# Patient Record
Sex: Female | Born: 1950
Health system: Southern US, Community
[De-identification: ages and names within clinical notes are randomized; demographics above are authoritative.]

## PROBLEM LIST (undated history)

## (undated) DIAGNOSIS — I428 Other cardiomyopathies: Secondary | ICD-10-CM

## (undated) DIAGNOSIS — K5790 Diverticulosis of intestine, part unspecified, without perforation or abscess without bleeding: Secondary | ICD-10-CM

## (undated) DIAGNOSIS — E1140 Type 2 diabetes mellitus with diabetic neuropathy, unspecified: Secondary | ICD-10-CM

## (undated) DIAGNOSIS — I447 Left bundle-branch block, unspecified: Secondary | ICD-10-CM

## (undated) DIAGNOSIS — IMO0002 Reserved for concepts with insufficient information to code with codable children: Secondary | ICD-10-CM

## (undated) DIAGNOSIS — E785 Hyperlipidemia, unspecified: Secondary | ICD-10-CM

## (undated) DIAGNOSIS — H269 Unspecified cataract: Secondary | ICD-10-CM

## (undated) DIAGNOSIS — T7840XA Allergy, unspecified, initial encounter: Secondary | ICD-10-CM

## (undated) DIAGNOSIS — N183 Chronic kidney disease, stage 3 unspecified: Secondary | ICD-10-CM

## (undated) DIAGNOSIS — K8020 Calculus of gallbladder without cholecystitis without obstruction: Secondary | ICD-10-CM

## (undated) DIAGNOSIS — C4491 Basal cell carcinoma of skin, unspecified: Secondary | ICD-10-CM

## (undated) DIAGNOSIS — I5020 Unspecified systolic (congestive) heart failure: Secondary | ICD-10-CM

## (undated) DIAGNOSIS — K5090 Crohn's disease, unspecified, without complications: Secondary | ICD-10-CM

## (undated) DIAGNOSIS — Z972 Presence of dental prosthetic device (complete) (partial): Secondary | ICD-10-CM

## (undated) DIAGNOSIS — K589 Irritable bowel syndrome without diarrhea: Secondary | ICD-10-CM

## (undated) DIAGNOSIS — G709 Myoneural disorder, unspecified: Secondary | ICD-10-CM

## (undated) DIAGNOSIS — I10 Essential (primary) hypertension: Secondary | ICD-10-CM

## (undated) DIAGNOSIS — M1990 Unspecified osteoarthritis, unspecified site: Secondary | ICD-10-CM

## (undated) DIAGNOSIS — E1165 Type 2 diabetes mellitus with hyperglycemia: Secondary | ICD-10-CM

## (undated) DIAGNOSIS — I2510 Atherosclerotic heart disease of native coronary artery without angina pectoris: Secondary | ICD-10-CM

## (undated) DIAGNOSIS — K219 Gastro-esophageal reflux disease without esophagitis: Secondary | ICD-10-CM

## (undated) DIAGNOSIS — E1122 Type 2 diabetes mellitus with diabetic chronic kidney disease: Secondary | ICD-10-CM

## (undated) HISTORY — DX: Myoneural disorder, unspecified: G70.9

## (undated) HISTORY — DX: Type 2 diabetes mellitus with diabetic chronic kidney disease: E11.65

## (undated) HISTORY — DX: Left bundle-branch block, unspecified: I44.7

## (undated) HISTORY — DX: Essential (primary) hypertension: I10

## (undated) HISTORY — DX: Allergy, unspecified, initial encounter: T78.40XA

## (undated) HISTORY — DX: Unspecified systolic (congestive) heart failure: I50.20

## (undated) HISTORY — PX: COLONOSCOPY: SHX174

## (undated) HISTORY — DX: Gastro-esophageal reflux disease without esophagitis: K21.9

## (undated) HISTORY — PX: SHOULDER SURGERY: SHX246

## (undated) HISTORY — DX: Calculus of gallbladder without cholecystitis without obstruction: K80.20

## (undated) HISTORY — DX: Unspecified osteoarthritis, unspecified site: M19.90

## (undated) HISTORY — DX: Type 2 diabetes mellitus with diabetic chronic kidney disease: E11.22

## (undated) HISTORY — PX: MOHS SURGERY: SHX181

## (undated) HISTORY — PX: VAGINAL HYSTERECTOMY: SUR661

## (undated) HISTORY — DX: Basal cell carcinoma of skin, unspecified: C44.91

## (undated) HISTORY — DX: Chronic kidney disease, stage 3 unspecified: N18.30

## (undated) HISTORY — DX: Reserved for concepts with insufficient information to code with codable children: IMO0002

## (undated) HISTORY — DX: Unspecified cataract: H26.9

## (undated) HISTORY — PX: SKIN SURGERY: SHX2413

## (undated) HISTORY — PX: CARDIAC CATHETERIZATION: SHX172

## (undated) HISTORY — DX: Other cardiomyopathies: I42.8

## (undated) HISTORY — PX: DILATION AND CURETTAGE OF UTERUS: SHX78

## (undated) HISTORY — DX: Diverticulosis of intestine, part unspecified, without perforation or abscess without bleeding: K57.90

## (undated) HISTORY — DX: Hyperlipidemia, unspecified: E78.5

## (undated) HISTORY — DX: Atherosclerotic heart disease of native coronary artery without angina pectoris: I25.10

## (undated) HISTORY — DX: Irritable bowel syndrome, unspecified: K58.9

## (undated) HISTORY — PX: BREAST CYST ASPIRATION: SHX578

## (undated) HISTORY — DX: Crohn's disease, unspecified, without complications: K50.90

## (undated) HISTORY — DX: Type 2 diabetes mellitus with diabetic neuropathy, unspecified: E11.40

---

## 2001-06-25 ENCOUNTER — Encounter: Payer: Self-pay | Admitting: Internal Medicine

## 2001-10-21 ENCOUNTER — Encounter: Payer: Self-pay | Admitting: Internal Medicine

## 2003-09-22 ENCOUNTER — Other Ambulatory Visit: Payer: Self-pay

## 2004-08-01 ENCOUNTER — Ambulatory Visit: Payer: Self-pay | Admitting: Internal Medicine

## 2004-08-08 ENCOUNTER — Ambulatory Visit: Payer: Self-pay | Admitting: Family Medicine

## 2004-08-09 ENCOUNTER — Ambulatory Visit: Payer: Self-pay | Admitting: Family Medicine

## 2004-09-05 ENCOUNTER — Ambulatory Visit: Payer: Self-pay | Admitting: Internal Medicine

## 2005-02-21 ENCOUNTER — Ambulatory Visit: Payer: Self-pay | Admitting: Internal Medicine

## 2005-10-18 ENCOUNTER — Ambulatory Visit: Payer: Self-pay | Admitting: Internal Medicine

## 2005-10-25 ENCOUNTER — Ambulatory Visit: Payer: Self-pay | Admitting: Internal Medicine

## 2005-11-02 ENCOUNTER — Ambulatory Visit: Payer: Self-pay | Admitting: Internal Medicine

## 2005-12-19 ENCOUNTER — Ambulatory Visit: Payer: Self-pay | Admitting: Internal Medicine

## 2006-04-18 ENCOUNTER — Ambulatory Visit: Payer: Self-pay | Admitting: Internal Medicine

## 2006-04-20 ENCOUNTER — Ambulatory Visit: Payer: Self-pay | Admitting: Internal Medicine

## 2006-08-28 ENCOUNTER — Ambulatory Visit: Payer: Self-pay | Admitting: Family Medicine

## 2006-10-31 ENCOUNTER — Ambulatory Visit: Payer: Self-pay | Admitting: Internal Medicine

## 2006-10-31 LAB — CONVERTED CEMR LAB
Albumin: 3.6 g/dL (ref 3.5–5.2)
BUN: 12 mg/dL (ref 6–23)
Calcium: 10 mg/dL (ref 8.4–10.5)
Chloride: 103 meq/L (ref 96–112)
Creatinine, Ser: 0.9 mg/dL (ref 0.4–1.2)
Creatinine,U: 100.9 mg/dL
GFR calc Af Amer: 84 mL/min
GFR calc non Af Amer: 69 mL/min
Microalb Creat Ratio: 7.9 mg/g (ref 0.0–30.0)
Microalb, Ur: 0.8 mg/dL (ref 0.0–1.9)
Sodium: 138 meq/L (ref 135–145)

## 2006-11-07 ENCOUNTER — Ambulatory Visit: Payer: Self-pay | Admitting: Internal Medicine

## 2006-11-22 ENCOUNTER — Ambulatory Visit: Payer: Self-pay | Admitting: Internal Medicine

## 2006-12-24 ENCOUNTER — Ambulatory Visit: Payer: Self-pay | Admitting: Internal Medicine

## 2007-01-23 ENCOUNTER — Ambulatory Visit: Payer: Self-pay | Admitting: Internal Medicine

## 2007-01-28 ENCOUNTER — Ambulatory Visit: Payer: Self-pay | Admitting: Internal Medicine

## 2007-01-31 ENCOUNTER — Ambulatory Visit: Payer: Self-pay | Admitting: Internal Medicine

## 2007-02-01 ENCOUNTER — Encounter: Payer: Self-pay | Admitting: Internal Medicine

## 2007-02-04 ENCOUNTER — Encounter: Payer: Self-pay | Admitting: Internal Medicine

## 2007-02-05 ENCOUNTER — Encounter: Payer: Self-pay | Admitting: Internal Medicine

## 2007-02-08 ENCOUNTER — Ambulatory Visit: Payer: Self-pay | Admitting: Internal Medicine

## 2007-03-03 ENCOUNTER — Ambulatory Visit: Payer: Self-pay | Admitting: Internal Medicine

## 2007-03-11 ENCOUNTER — Encounter: Payer: Self-pay | Admitting: Internal Medicine

## 2007-03-14 ENCOUNTER — Ambulatory Visit: Payer: Self-pay | Admitting: Internal Medicine

## 2007-04-18 DIAGNOSIS — J309 Allergic rhinitis, unspecified: Secondary | ICD-10-CM | POA: Insufficient documentation

## 2007-04-18 DIAGNOSIS — K5792 Diverticulitis of intestine, part unspecified, without perforation or abscess without bleeding: Secondary | ICD-10-CM | POA: Insufficient documentation

## 2007-04-18 DIAGNOSIS — I10 Essential (primary) hypertension: Secondary | ICD-10-CM | POA: Insufficient documentation

## 2007-04-18 DIAGNOSIS — M1990 Unspecified osteoarthritis, unspecified site: Secondary | ICD-10-CM | POA: Insufficient documentation

## 2007-04-18 DIAGNOSIS — K8020 Calculus of gallbladder without cholecystitis without obstruction: Secondary | ICD-10-CM | POA: Insufficient documentation

## 2007-04-18 DIAGNOSIS — I447 Left bundle-branch block, unspecified: Secondary | ICD-10-CM | POA: Insufficient documentation

## 2007-04-21 DIAGNOSIS — E1122 Type 2 diabetes mellitus with diabetic chronic kidney disease: Secondary | ICD-10-CM | POA: Insufficient documentation

## 2007-04-21 DIAGNOSIS — Z794 Long term (current) use of insulin: Secondary | ICD-10-CM

## 2007-04-21 DIAGNOSIS — E119 Type 2 diabetes mellitus without complications: Secondary | ICD-10-CM

## 2007-04-21 DIAGNOSIS — IMO0002 Reserved for concepts with insufficient information to code with codable children: Secondary | ICD-10-CM | POA: Insufficient documentation

## 2007-05-01 ENCOUNTER — Ambulatory Visit: Payer: Self-pay | Admitting: Internal Medicine

## 2007-05-01 LAB — CONVERTED CEMR LAB
CO2: 28 meq/L (ref 19–32)
Calcium: 10.4 mg/dL (ref 8.4–10.5)
Glucose, Bld: 197 mg/dL — ABNORMAL HIGH (ref 70–99)
Potassium: 3.7 meq/L (ref 3.5–5.1)
Sodium: 139 meq/L (ref 135–145)

## 2007-07-23 ENCOUNTER — Encounter: Payer: Self-pay | Admitting: Internal Medicine

## 2007-08-01 ENCOUNTER — Telehealth (INDEPENDENT_AMBULATORY_CARE_PROVIDER_SITE_OTHER): Payer: Self-pay | Admitting: *Deleted

## 2007-09-12 ENCOUNTER — Ambulatory Visit: Payer: Self-pay | Admitting: Internal Medicine

## 2007-09-30 ENCOUNTER — Telehealth: Payer: Self-pay | Admitting: Family Medicine

## 2008-01-06 ENCOUNTER — Ambulatory Visit: Payer: Self-pay | Admitting: Internal Medicine

## 2008-01-09 LAB — CONVERTED CEMR LAB
CO2: 29 meq/L (ref 19–32)
Cholesterol: 174 mg/dL (ref 0–200)
Creatinine,U: 134.8 mg/dL
GFR calc non Af Amer: 55 mL/min
Glucose, Bld: 143 mg/dL — ABNORMAL HIGH (ref 70–99)
Hemoglobin: 12.4 g/dL (ref 12.0–15.0)
Hgb A1c MFr Bld: 8 % — ABNORMAL HIGH (ref 4.6–6.0)
Lymphocytes Relative: 38.2 % (ref 12.0–46.0)
Microalb Creat Ratio: 5.2 mg/g (ref 0.0–30.0)
Microalb, Ur: 0.7 mg/dL (ref 0.0–1.9)
Monocytes Relative: 7.1 % (ref 3.0–12.0)
Neutro Abs: 4.8 10*3/uL (ref 1.4–7.7)
Platelets: 226 10*3/uL (ref 150–400)
Potassium: 3.4 meq/L — ABNORMAL LOW (ref 3.5–5.1)
RDW: 13.4 % (ref 11.5–14.6)
Sodium: 140 meq/L (ref 135–145)
Total CHOL/HDL Ratio: 6.1
VLDL: 71 mg/dL — ABNORMAL HIGH (ref 0–40)
WBC: 9.1 10*3/uL (ref 4.5–10.5)

## 2008-02-03 ENCOUNTER — Ambulatory Visit: Payer: Self-pay | Admitting: Internal Medicine

## 2008-02-14 ENCOUNTER — Encounter (INDEPENDENT_AMBULATORY_CARE_PROVIDER_SITE_OTHER): Payer: Self-pay | Admitting: *Deleted

## 2008-02-17 ENCOUNTER — Encounter (INDEPENDENT_AMBULATORY_CARE_PROVIDER_SITE_OTHER): Payer: Self-pay | Admitting: *Deleted

## 2008-02-20 ENCOUNTER — Telehealth: Payer: Self-pay | Admitting: Internal Medicine

## 2008-05-01 ENCOUNTER — Telehealth (INDEPENDENT_AMBULATORY_CARE_PROVIDER_SITE_OTHER): Payer: Self-pay | Admitting: *Deleted

## 2008-05-06 ENCOUNTER — Telehealth (INDEPENDENT_AMBULATORY_CARE_PROVIDER_SITE_OTHER): Payer: Self-pay | Admitting: *Deleted

## 2008-07-15 ENCOUNTER — Ambulatory Visit: Payer: Self-pay | Admitting: Internal Medicine

## 2008-07-16 LAB — CONVERTED CEMR LAB
BUN: 18 mg/dL (ref 6–23)
Calcium: 9.7 mg/dL (ref 8.4–10.5)
Eosinophils Absolute: 0.2 10*3/uL (ref 0.0–0.7)
Eosinophils Relative: 2.1 % (ref 0.0–5.0)
HCT: 36.2 % (ref 36.0–46.0)
Hgb A1c MFr Bld: 7.6 % — ABNORMAL HIGH (ref 4.6–6.0)
MCV: 88.6 fL (ref 78.0–100.0)
Monocytes Absolute: 0.8 10*3/uL (ref 0.1–1.0)
Phosphorus: 3 mg/dL (ref 2.3–4.6)
Platelets: 243 10*3/uL (ref 150–400)
Potassium: 3.8 meq/L (ref 3.5–5.1)
RDW: 13.4 % (ref 11.5–14.6)
Sodium: 141 meq/L (ref 135–145)

## 2008-08-24 ENCOUNTER — Telehealth: Payer: Self-pay | Admitting: Internal Medicine

## 2008-09-28 ENCOUNTER — Telehealth: Payer: Self-pay | Admitting: Internal Medicine

## 2008-11-27 ENCOUNTER — Encounter: Payer: Self-pay | Admitting: Internal Medicine

## 2008-12-04 ENCOUNTER — Encounter: Admission: RE | Admit: 2008-12-04 | Discharge: 2008-12-04 | Payer: Self-pay | Admitting: Surgery

## 2008-12-15 ENCOUNTER — Ambulatory Visit: Payer: Self-pay | Admitting: Family Medicine

## 2008-12-16 LAB — CONVERTED CEMR LAB
Albumin: 3.9 g/dL (ref 3.5–5.2)
BUN: 16 mg/dL (ref 6–23)
Bilirubin, Direct: 0 mg/dL (ref 0.0–0.3)
CO2: 28 meq/L (ref 19–32)
Calcium: 9.5 mg/dL (ref 8.4–10.5)
Creatinine, Ser: 0.9 mg/dL (ref 0.4–1.2)
Hgb A1c MFr Bld: 8.5 % — ABNORMAL HIGH (ref 4.6–6.5)
Total Protein: 6.6 g/dL (ref 6.0–8.3)

## 2008-12-23 ENCOUNTER — Ambulatory Visit: Payer: Self-pay | Admitting: Cardiology

## 2008-12-29 ENCOUNTER — Ambulatory Visit: Payer: Self-pay | Admitting: Cardiovascular Disease

## 2008-12-29 ENCOUNTER — Ambulatory Visit: Payer: Self-pay

## 2008-12-29 ENCOUNTER — Encounter: Payer: Self-pay | Admitting: Cardiology

## 2008-12-29 LAB — CONVERTED CEMR LAB
Cholesterol: 177 mg/dL (ref 0–200)
Triglycerides: 254 mg/dL — ABNORMAL HIGH (ref <150)
VLDL: 51 mg/dL — ABNORMAL HIGH (ref 0–40)

## 2008-12-30 ENCOUNTER — Telehealth: Payer: Self-pay | Admitting: Internal Medicine

## 2009-01-04 ENCOUNTER — Encounter: Payer: Self-pay | Admitting: Cardiology

## 2009-01-04 ENCOUNTER — Ambulatory Visit: Payer: Self-pay | Admitting: Internal Medicine

## 2009-01-04 LAB — CONVERTED CEMR LAB
BUN: 20 mg/dL (ref 6–23)
CO2: 22 meq/L (ref 19–32)
Chloride: 105 meq/L (ref 96–112)
Creatinine, Ser: 1.04 mg/dL (ref 0.40–1.20)
Glucose, Bld: 250 mg/dL — ABNORMAL HIGH (ref 70–99)
HCT: 38.3 % (ref 36.0–46.0)
Hemoglobin: 12.2 g/dL (ref 12.0–15.0)
MCHC: 31.9 g/dL (ref 30.0–36.0)
MCV: 91.6 fL (ref 78.0–100.0)
RBC: 4.18 M/uL (ref 3.87–5.11)

## 2009-01-07 ENCOUNTER — Inpatient Hospital Stay (HOSPITAL_BASED_OUTPATIENT_CLINIC_OR_DEPARTMENT_OTHER): Admission: RE | Admit: 2009-01-07 | Discharge: 2009-01-07 | Payer: Self-pay | Admitting: Cardiology

## 2009-01-07 ENCOUNTER — Encounter: Payer: Self-pay | Admitting: Internal Medicine

## 2009-01-07 ENCOUNTER — Ambulatory Visit: Payer: Self-pay | Admitting: Cardiology

## 2009-01-11 ENCOUNTER — Ambulatory Visit: Payer: Self-pay | Admitting: Internal Medicine

## 2009-01-18 ENCOUNTER — Ambulatory Visit: Payer: Self-pay | Admitting: Cardiology

## 2009-01-28 ENCOUNTER — Encounter: Payer: Self-pay | Admitting: Internal Medicine

## 2009-02-05 ENCOUNTER — Ambulatory Visit: Payer: Self-pay | Admitting: Family Medicine

## 2009-02-05 DIAGNOSIS — M109 Gout, unspecified: Secondary | ICD-10-CM | POA: Insufficient documentation

## 2009-02-09 ENCOUNTER — Ambulatory Visit: Payer: Self-pay | Admitting: Endocrinology

## 2009-02-22 ENCOUNTER — Encounter: Payer: Self-pay | Admitting: Cardiology

## 2009-02-22 ENCOUNTER — Ambulatory Visit: Payer: Self-pay | Admitting: Cardiology

## 2009-03-02 ENCOUNTER — Ambulatory Visit: Payer: Self-pay | Admitting: Endocrinology

## 2009-03-08 ENCOUNTER — Telehealth: Payer: Self-pay | Admitting: Cardiology

## 2009-03-10 DIAGNOSIS — K589 Irritable bowel syndrome without diarrhea: Secondary | ICD-10-CM | POA: Insufficient documentation

## 2009-03-11 ENCOUNTER — Encounter: Payer: Self-pay | Admitting: Internal Medicine

## 2009-03-18 ENCOUNTER — Telehealth: Payer: Self-pay | Admitting: Internal Medicine

## 2009-04-13 ENCOUNTER — Ambulatory Visit: Payer: Self-pay | Admitting: Endocrinology

## 2009-04-16 ENCOUNTER — Telehealth: Payer: Self-pay | Admitting: Internal Medicine

## 2009-04-26 ENCOUNTER — Encounter: Payer: Self-pay | Admitting: Cardiology

## 2009-04-26 ENCOUNTER — Ambulatory Visit: Payer: Self-pay | Admitting: Cardiovascular Disease

## 2009-05-03 LAB — CONVERTED CEMR LAB
Bilirubin, Direct: 0.1 mg/dL (ref 0.0–0.3)
Indirect Bilirubin: 0.2 mg/dL (ref 0.0–0.9)
LDL Cholesterol: 75 mg/dL (ref 0–99)
Total CHOL/HDL Ratio: 5.2
Total Protein: 6.8 g/dL (ref 6.0–8.3)
VLDL: 62 mg/dL — ABNORMAL HIGH (ref 0–40)

## 2009-05-11 ENCOUNTER — Ambulatory Visit: Payer: Self-pay | Admitting: Family Medicine

## 2009-05-11 DIAGNOSIS — E785 Hyperlipidemia, unspecified: Secondary | ICD-10-CM | POA: Insufficient documentation

## 2009-05-11 DIAGNOSIS — E1169 Type 2 diabetes mellitus with other specified complication: Secondary | ICD-10-CM | POA: Insufficient documentation

## 2009-05-13 LAB — CONVERTED CEMR LAB
Creatinine,U: 153.9 mg/dL
Hgb A1c MFr Bld: 8.7 % — ABNORMAL HIGH (ref 4.6–6.5)
Microalb, Ur: 1.5 mg/dL (ref 0.0–1.9)

## 2009-06-04 ENCOUNTER — Ambulatory Visit: Payer: Self-pay | Admitting: Endocrinology

## 2009-06-14 ENCOUNTER — Telehealth: Payer: Self-pay | Admitting: Family Medicine

## 2009-06-22 ENCOUNTER — Ambulatory Visit: Payer: Self-pay | Admitting: Endocrinology

## 2009-07-06 ENCOUNTER — Encounter: Payer: Self-pay | Admitting: Cardiology

## 2009-07-06 ENCOUNTER — Ambulatory Visit: Payer: Self-pay

## 2009-07-07 ENCOUNTER — Telehealth: Payer: Self-pay | Admitting: Cardiology

## 2009-07-12 ENCOUNTER — Ambulatory Visit: Payer: Self-pay | Admitting: Cardiology

## 2009-07-20 ENCOUNTER — Ambulatory Visit: Payer: Self-pay | Admitting: Endocrinology

## 2009-07-26 ENCOUNTER — Ambulatory Visit: Payer: Self-pay | Admitting: Internal Medicine

## 2009-07-26 ENCOUNTER — Encounter: Payer: Self-pay | Admitting: Cardiology

## 2009-07-27 LAB — CONVERTED CEMR LAB
BUN: 13 mg/dL (ref 6–23)
Calcium: 9.9 mg/dL (ref 8.4–10.5)
Potassium: 4 meq/L (ref 3.5–5.3)
Sodium: 140 meq/L (ref 135–145)

## 2009-08-02 ENCOUNTER — Ambulatory Visit: Payer: Self-pay | Admitting: Endocrinology

## 2009-08-02 DIAGNOSIS — L408 Other psoriasis: Secondary | ICD-10-CM | POA: Insufficient documentation

## 2009-08-12 ENCOUNTER — Encounter: Payer: Self-pay | Admitting: Cardiology

## 2009-11-15 ENCOUNTER — Ambulatory Visit: Payer: Self-pay | Admitting: Family Medicine

## 2009-11-18 LAB — CONVERTED CEMR LAB
Basophils Absolute: 0 10*3/uL (ref 0.0–0.1)
Direct LDL: 113.7 mg/dL
Eosinophils Relative: 2.2 % (ref 0.0–5.0)
HCT: 39.9 % (ref 36.0–46.0)
Hgb A1c MFr Bld: 9.1 % — ABNORMAL HIGH (ref 4.6–6.5)
Lymphocytes Relative: 41.5 % (ref 12.0–46.0)
Lymphs Abs: 3.2 10*3/uL (ref 0.7–4.0)
Monocytes Relative: 11 % (ref 3.0–12.0)
Platelets: 205 10*3/uL (ref 150.0–400.0)
RDW: 13.5 % (ref 11.5–14.6)
WBC: 7.7 10*3/uL (ref 4.5–10.5)

## 2009-11-19 ENCOUNTER — Encounter: Payer: Self-pay | Admitting: Internal Medicine

## 2009-11-29 ENCOUNTER — Encounter: Payer: Self-pay | Admitting: Family Medicine

## 2009-11-30 ENCOUNTER — Ambulatory Visit: Payer: Self-pay | Admitting: Endocrinology

## 2009-12-14 ENCOUNTER — Ambulatory Visit: Payer: Self-pay | Admitting: Endocrinology

## 2010-01-11 ENCOUNTER — Ambulatory Visit: Payer: Self-pay | Admitting: Endocrinology

## 2010-02-08 ENCOUNTER — Ambulatory Visit: Payer: Self-pay | Admitting: Endocrinology

## 2010-02-22 ENCOUNTER — Encounter: Payer: Self-pay | Admitting: Endocrinology

## 2010-03-17 ENCOUNTER — Encounter (INDEPENDENT_AMBULATORY_CARE_PROVIDER_SITE_OTHER): Payer: Self-pay | Admitting: *Deleted

## 2010-03-22 ENCOUNTER — Ambulatory Visit: Payer: Self-pay | Admitting: Endocrinology

## 2010-03-22 LAB — CONVERTED CEMR LAB
Glucose, Urine, Semiquant: NEGATIVE
Hgb A1c MFr Bld: 8.5 % — ABNORMAL HIGH (ref 4.6–6.5)
Ketones, urine, test strip: NEGATIVE
Urobilinogen, UA: 0.2
WBC Urine, dipstick: NEGATIVE
pH: 5

## 2010-04-26 ENCOUNTER — Ambulatory Visit: Payer: Self-pay | Admitting: Family Medicine

## 2010-05-01 DIAGNOSIS — R740 Nonspecific elevation of levels of transaminase and lactic acid dehydrogenase [LDH]: Secondary | ICD-10-CM

## 2010-05-01 DIAGNOSIS — R7401 Elevation of levels of liver transaminase levels: Secondary | ICD-10-CM | POA: Insufficient documentation

## 2010-05-01 LAB — CONVERTED CEMR LAB
AST: 76 units/L — ABNORMAL HIGH (ref 0–37)
Alkaline Phosphatase: 86 units/L (ref 39–117)
Basophils Absolute: 0 10*3/uL (ref 0.0–0.1)
Basophils Relative: 0.5 % (ref 0.0–3.0)
Bilirubin, Direct: 0.1 mg/dL (ref 0.0–0.3)
CO2: 28 meq/L (ref 19–32)
Calcium: 10 mg/dL (ref 8.4–10.5)
Creatinine, Ser: 0.9 mg/dL (ref 0.4–1.2)
Direct LDL: 123.3 mg/dL
Eosinophils Absolute: 0.2 10*3/uL (ref 0.0–0.7)
GFR calc non Af Amer: 68.14 mL/min (ref >60)
Hemoglobin: 13.4 g/dL (ref 12.0–15.0)
Lymphocytes Relative: 38.4 % (ref 12.0–46.0)
MCHC: 34.2 g/dL (ref 30.0–36.0)
Monocytes Relative: 8.2 % (ref 3.0–12.0)
Neutro Abs: 4.8 10*3/uL (ref 1.4–7.7)
Neutrophils Relative %: 50.6 % (ref 43.0–77.0)
RBC: 4.34 M/uL (ref 3.87–5.11)
Sodium: 137 meq/L (ref 135–145)
Total Protein: 7 g/dL (ref 6.0–8.3)
WBC: 9.5 10*3/uL (ref 4.5–10.5)

## 2010-05-26 ENCOUNTER — Encounter (INDEPENDENT_AMBULATORY_CARE_PROVIDER_SITE_OTHER): Payer: Self-pay | Admitting: *Deleted

## 2010-06-02 ENCOUNTER — Ambulatory Visit: Payer: Self-pay | Admitting: Family Medicine

## 2010-06-13 ENCOUNTER — Ambulatory Visit: Payer: Self-pay | Admitting: Family Medicine

## 2010-06-14 ENCOUNTER — Encounter: Admission: RE | Admit: 2010-06-14 | Discharge: 2010-06-14 | Payer: Self-pay | Admitting: Family Medicine

## 2010-06-15 LAB — CONVERTED CEMR LAB: Hepatitis B Surface Ag: NEGATIVE

## 2010-08-22 ENCOUNTER — Ambulatory Visit: Payer: Self-pay | Admitting: Cardiovascular Disease

## 2010-10-24 ENCOUNTER — Other Ambulatory Visit: Payer: Self-pay | Admitting: Family Medicine

## 2010-10-24 ENCOUNTER — Telehealth: Payer: Self-pay | Admitting: Family Medicine

## 2010-10-24 ENCOUNTER — Telehealth: Payer: Self-pay | Admitting: Endocrinology

## 2010-10-24 ENCOUNTER — Ambulatory Visit
Admission: RE | Admit: 2010-10-24 | Discharge: 2010-10-24 | Payer: Self-pay | Source: Home / Self Care | Attending: Family Medicine | Admitting: Family Medicine

## 2010-10-25 ENCOUNTER — Encounter: Payer: Self-pay | Admitting: Family Medicine

## 2010-10-25 LAB — HEPATIC FUNCTION PANEL
ALT: 76 U/L — ABNORMAL HIGH (ref 0–35)
AST: 77 U/L — ABNORMAL HIGH (ref 0–37)
Albumin: 4.1 g/dL (ref 3.5–5.2)
Alkaline Phosphatase: 115 U/L (ref 39–117)
Bilirubin, Direct: 0.1 mg/dL (ref 0.0–0.3)
Total Bilirubin: 0.2 mg/dL — ABNORMAL LOW (ref 0.3–1.2)
Total Protein: 6.5 g/dL (ref 6.0–8.3)

## 2010-10-25 LAB — BASIC METABOLIC PANEL WITH GFR
BUN: 16 mg/dL (ref 6–23)
CO2: 29 meq/L (ref 19–32)
Calcium: 9.9 mg/dL (ref 8.4–10.5)
Chloride: 100 meq/L (ref 96–112)
Creatinine, Ser: 1.2 mg/dL (ref 0.4–1.2)
GFR: 50.25 mL/min — ABNORMAL LOW
Glucose, Bld: 405 mg/dL — ABNORMAL HIGH (ref 70–99)
Potassium: 4.5 meq/L (ref 3.5–5.1)
Sodium: 136 meq/L (ref 135–145)

## 2010-10-26 LAB — CONVERTED CEMR LAB
AST: 74 units/L — ABNORMAL HIGH (ref 0–37)
Albumin: 3.9 g/dL (ref 3.5–5.2)
Alkaline Phosphatase: 99 units/L (ref 39–117)
Total Protein: 6.3 g/dL (ref 6.0–8.3)

## 2010-11-01 NOTE — Assessment & Plan Note (Signed)
Summary: FU--STC    Vital Signs:  Patient profile:   60 year old female Height:      63 inches (160.02 cm) Weight:      149.38 pounds (67.90 kg) O2 Sat:      97 % on Room air Temp:     97.9 degrees F (36.61 degrees C) oral Pulse rate:   92 / minute BP sitting:   132 / 70  (left arm) Cuff size:   regular  Vitals Entered By: Gardenia Phlegm RMA (November 30, 2009 1:07 PM)  O2 Flow:  Room air CC: Follow-up visit/ CF Is Patient Diabetic? Yes   Primary Provider:  Owens Loffler, MD  CC:  Follow-up visit/ CF.  History of Present Illness: pt states she feels well in general.  she brings a record of her cbg's which i have reviewed today. it is 100's-200's before meals.  she does not check at hs.  it is in general lowest in am, then higher as the day goes on.    Current Medications (verified): 1)  Tramadol Hcl 50 Mg Tabs (Tramadol Hcl) .... Take 1 Tablet By  A Day As Needed 2)  Trazodone Hcl 150 Mg  Tabs (Trazodone Hcl) .... Take 1/2 By Mouth At Bedtime 3)  Lantus Solostar 100 Unit/ml  Soln (Insulin Glargine) .... 40 Units Once A Day 4)  Lisinopril 40 Mg Tabs (Lisinopril) .... Take One Tablet By Mouth Daily 5)  Align  Caps (Misc Intestinal Flora Regulat) .Marland Kitchen.. 1 Daily 6)  Aspirin 81 Mg Tbec (Aspirin) .... Take One Tablet By Mouth Daily 7)  Carvedilol 25 Mg Tabs (Carvedilol) .Marland Kitchen.. 1 By Mouth Two Times A Day 8)  Humalog Kwikpen 100 Unit/ml Soln (Insulin Lispro (Human)) .... Three Times A Day (Qac) 30-30-30 Units 9)  Novofine 32g X 6 Mm Misc (Insulin Pen Needle) .... Use Per Directions With Solastar 10)  Colchicine 0.6 Mg Tabs (Colchicine) .Marland Kitchen.. 1 Daily To Two Times A Day As Needed For Gout Flare 11)  Pravachol 10 Mg Tabs (Pravastatin Sodium) .... 1/2 Tablet  By Mouth At Bedtime 12)  Coq10 50 Mg Caps (Coenzyme Q10) .... Once Daily  Allergies (verified): 1)  * Inderal (Propranolol) 2)  Tenex (Guanfacine Hcl) 3)  * Lodine (Etodolac) 4)  Avandia (Rosiglitazone Maleate) 5)  Amaryl  (Glimepiride)  Past History:  Past Medical History: Last updated: 07/12/2009 1. Diabetes, on insulin.  2. Diverticulitis. 3. Hypertension. 4. Osteoarthritis. 5. Symptomatic cholelithiasis. 6. Chronic left bundle branch block, this was first noted about 10 years ago. 7. Irritable bowel syndrome. 8. Mild nonischemic cardiomyopathy.  The patient had an echo done in March 2010 showing EF of 40%, inferior hypokinesis, mild LVH, septal dyssynergy, and no significant valvular problems.  Left heart catheterization was done to assess for coronary disease and the cause of cardiomyopathy, LVEDP was 16 mmHg, EF was 45%.  There were mild luminal irregularities only in the coronaries.  It is possible that mild cardiomyopathy is due to poorly controlled diabetes. Most recent echo (10/10) with EF 50-55%, no regional wall motion abnormalities, LV dyssynergy consistent with LBBB.  9. Allergic rhinitis 10. IRRITABLE BOWEL SYNDROME (ICD-564.1) 11. GOUT (ICD-274.9) 12. Hyperlipidemia   [facility] Dr Jeannett Senior Dr [phone] Dr Maryellen Pile  [phone]  Review of Systems  The patient denies syncope.         she had 1 episode of mild hypoglycemia, just before lunch.  Physical Exam  General:  normal appearance.   Skin:  insulin injection sites at  anterior abdomen are normal    Impression & Recommendations:  Problem # 1:  DIABETES MELLITUS, TYPE II (ICD-250.00) needs increased rx  Medications Added to Medication List This Visit: 1)  Humalog Kwikpen 100 Unit/ml Soln (Insulin lispro (human)) .... Three times a day (qac) 30-40-40 units 2)  Coq10 50 Mg Caps (Coenzyme q10) .... Once daily  Other Orders: Est. Patient Level III (33125)  Patient Instructions: 1)  continue lantus 40 units per day. 2)  increase humalog to (just before each meal) 30-40-40 units. 3)  Please schedule a follow-up appointment in 2 weeks. 4)  check your blood sugar 4 times a day--before the 3 meals, and at  bedtime.  also check if you have symptoms of your blood sugar being too high or too low.  please keep a record of the readings and bring it to your next appointment here.  please call us sooner if you are having low blood sugar episodes. 5)  it is very important to check at betime. Prescriptions: HUMALOG KWIKPEN 100 UNIT/ML SOLN (INSULIN LISPRO (HUMAN)) three times a day (qac) 30-40-40 units  #1 box x 11   Entered and Authorized by:   Donavan Foil MD   Signed by:   Donavan Foil MD on 11/30/2009   Method used:   Print then Give to Patient   RxID:   [id number]

## 2010-11-01 NOTE — Letter (Signed)
Summary: [facility]  [facility]   Imported By: Bubba Hales 03/03/2010 11:14:48  _____________________________________________________________________  External Attachment:    Type:   Image     Comment:   External Document

## 2010-11-01 NOTE — Assessment & Plan Note (Signed)
Summary: 1 MOS F/U / # /CD   Vital Signs:  Patient profile:   60 year old female Height:      64 inches (162.56 cm) Weight:      150.13 pounds (68.24 kg) O2 Sat:      96 % on Room air Temp:     98.2 degrees F (36.78 degrees C) oral Pulse rate:   95 / minute BP sitting:   110 / 60  (left arm) Cuff size:   regular  Vitals Entered By: Gardenia Phlegm RMA (Feb 08, 2010 4:08 PM)  O2 Flow:  Room air CC: 1 month follow up/ CF Is Patient Diabetic? Yes   Primary Provider:  Owens Loffler, MD  CC:  1 month follow up/ CF.  History of Present Illness: she brings a record of her cbg's which i have reviewed today. she says cbg goes low before lunch if she takes 45 units humalog with breakfast.  it varies from 100-250.  it is highest at hs, and lowest in am.  pt states he feels well in general.   Current Medications (verified): 1)  Tramadol Hcl 50 Mg Tabs (Tramadol Hcl) .... Take 1 Tablet By  A Day As Needed 2)  Trazodone Hcl 150 Mg  Tabs (Trazodone Hcl) .... Take 1/2 By Mouth At Bedtime 3)  Lantus Solostar 100 Unit/ml  Soln (Insulin Glargine) .... 55 Units Once A Day 4)  Lisinopril 40 Mg Tabs (Lisinopril) .... Take One Tablet By Mouth Daily 5)  Align  Caps (Misc Intestinal Flora Regulat) .Marland Kitchen.. 1 Daily 6)  Aspirin 81 Mg Tbec (Aspirin) .... Take One Tablet By Mouth Daily 7)  Carvedilol 25 Mg Tabs (Carvedilol) .Marland Kitchen.. 1 By Mouth Two Times A Day 8)  Humalog Kwikpen 100 Unit/ml Soln (Insulin Lispro (Human)) .... Three Times A Day (Qac) 45-50-50 Units 9)  Novofine 32g X 6 Mm Misc (Insulin Pen Needle) .... Use Per Directions With Solastar 10)  Colchicine 0.6 Mg Tabs (Colchicine) .Marland Kitchen.. 1 Daily To Two Times A Day As Needed For Gout Flare 11)  Pravachol 10 Mg Tabs (Pravastatin Sodium) .... 1/2 Tablet  By Mouth At Bedtime 12)  Coq10 50 Mg Caps (Coenzyme Q10) .... Once Daily  Allergies (verified): 1)  * Inderal (Propranolol) 2)  Tenex (Guanfacine Hcl) 3)  * Lodine (Etodolac) 4)  Avandia  (Rosiglitazone Maleate) 5)  Amaryl (Glimepiride)  Past History:  Past Medical History: Last updated: 07/12/2009 1. Diabetes, on insulin.  2. Diverticulitis. 3. Hypertension. 4. Osteoarthritis. 5. Symptomatic cholelithiasis. 6. Chronic left bundle branch block, this was first noted about 10 years ago. 7. Irritable bowel syndrome. 8. Mild nonischemic cardiomyopathy.  The patient had an echo done in March 2010 showing EF of 40%, inferior hypokinesis, mild LVH, septal dyssynergy, and no significant valvular problems.  Left heart catheterization was done to assess for coronary disease and the cause of cardiomyopathy, LVEDP was 16 mmHg, EF was 45%.  There were mild luminal irregularities only in the coronaries.  It is possible that mild cardiomyopathy is due to poorly controlled diabetes. Most recent echo (10/10) with EF 50-55%, no regional wall motion abnormalities, LV dyssynergy consistent with LBBB.  9. Allergic rhinitis 10. IRRITABLE BOWEL SYNDROME (ICD-564.1) 11. GOUT (ICD-274.9) 12. Hyperlipidemia   [facility] Dr Jeannett Senior Dr [phone] Dr Maryellen Pile  [phone]  Social History: Reviewed history from 03/10/2009 and no changes required. Former Smoker, quit 6/01 Alcohol use-no Marital Status: Married Children: None Occupation: Electrical engineer Regular Exercise - no Drug Use -  no  Review of Systems  The patient denies syncope.    Physical Exam  General:  normal appearance.   Psych:  Alert and cooperative; normal mood and affect; normal attention span and concentration.     Impression & Recommendations:  Problem # 1:  DIABETES MELLITUS, TYPE II (ICD-250.00) Assessment Improved she needs some minor adjustments in her therapy  Medications Added to Medication List This Visit: 1)  Lantus Solostar 100 Unit/ml Soln (Insulin glargine) .... 45 units once a day 2)  Humalog Kwikpen 100 Unit/ml Soln (Insulin lispro (human)) .... Three times a day  (qac) 40-50-60 units  Other Orders: Est. Patient Level III (04599)  Patient Instructions: 1)  decrease lantus to 45 units at bedtime. 2)  decrease humalog to (just before each meal) 40-50-60 units. 3)  Please schedule a follow-up appointment in 6 weeks. 4)  check your blood sugar 4 times a day--before the 3 meals, and at bedtime.  also check if you have symptoms of your blood sugar being too high or too low.  please keep a record of the readings and bring it to your next appointment here.  please call us sooner if you are having low blood sugar episodes.
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
## 2010-11-01 NOTE — Assessment & Plan Note (Signed)
Summary: 2 WK ROV /NWS   Vital Signs:  Patient profile:   60 year old female Height:      64 inches Weight:      146.25 pounds BMI:     25.19 O2 Sat:      96 % on Room air Temp:     98.9 degrees F oral Pulse rate:   98 / minute BP sitting:   126 / 78  (left arm) Cuff size:   regular  Vitals Entered By: Crissie Sickles, CMA (December 14, 2009 4:31 PM)  O2 Flow:  Room air CC: 2 week F/U   Primary Provider:  Owens Loffler, MD  CC:  2 week F/U.  History of Present Illness: pt states he feels well in general.  he brings a record of his cbg's which i have reviewed today.  it is improved overall.  most are in the 200's, but some are in the mid-100's.  it is lowest in the afternoon, and higher at other times of day.  Allergies: 1)  * Inderal (Propranolol) 2)  Tenex (Guanfacine Hcl) 3)  * Lodine (Etodolac) 4)  Avandia (Rosiglitazone Maleate) 5)  Amaryl (Glimepiride)  Past History:  Past Medical History: Last updated: 07/12/2009 1. Diabetes, on insulin.  2. Diverticulitis. 3. Hypertension. 4. Osteoarthritis. 5. Symptomatic cholelithiasis. 6. Chronic left bundle branch block, this was first noted about 10 years ago. 7. Irritable bowel syndrome. 8. Mild nonischemic cardiomyopathy.  The patient had an echo done in March 2010 showing EF of 40%, inferior hypokinesis, mild LVH, septal dyssynergy, and no significant valvular problems.  Left heart catheterization was done to assess for coronary disease and the cause of cardiomyopathy, LVEDP was 16 mmHg, EF was 45%.  There were mild luminal irregularities only in the coronaries.  It is possible that mild cardiomyopathy is due to poorly controlled diabetes. Most recent echo (10/10) with EF 50-55%, no regional wall motion abnormalities, LV dyssynergy consistent with LBBB.  9. Allergic rhinitis 10. IRRITABLE BOWEL SYNDROME (ICD-564.1) 11. GOUT (ICD-274.9) 12. Hyperlipidemia   [facility] Dr Jeannett Senior Dr [phone] Dr  Maryellen Pile  [phone]  Review of Systems  The patient denies hypoglycemia.    Physical Exam  General:  normal appearance.   Psych:  Alert and cooperative; normal mood and affect; normal attention span and concentration.     Impression & Recommendations:  Problem # 1:  DIABETES MELLITUS, TYPE II (ICD-250.00) needs increased rx  Medications Added to Medication List This Visit: 1)  Lantus Solostar 100 Unit/ml Soln (Insulin glargine) .... 45 units once a day 2)  Humalog Kwikpen 100 Unit/ml Soln (Insulin lispro (human)) .... Three times a day (qac) 35-40-45 units  Other Orders: Est. Patient Level III (94801)  Patient Instructions: 1)  increase lantus to 45 units per day. 2)  increase humalog to (just before each meal) 35-40-45 units. 3)  Please schedule a follow-up appointment in 1 month. 4)  check your blood sugar 4 times a day--before the 3 meals, and at bedtime.  also check if you have symptoms of your blood sugar being too high or too low.  please keep a record of the readings and bring it to your next appointment here.  please call us sooner if you are having low blood sugar episodes. 5)  you should consider insulin pump therapy.  please let me know if you want blood tests to see if you qualify.

## 2010-11-01 NOTE — Miscellaneous (Signed)
  Medications Added ACCU-CHEK ADVANTAGE TEST  STRP (GLUCOSE BLOOD) test blood sugar 2 times daily       Clinical Lists Changes  Medications: Added new medication of ACCU-CHEK ADVANTAGE TEST  STRP (GLUCOSE BLOOD) test blood sugar 2 times daily - Signed Rx of ACCU-CHEK ADVANTAGE TEST  STRP (GLUCOSE BLOOD) test blood sugar 2 times daily;  #60 x 5;  Signed;  Entered by: Zenda Alpers CMA (AAMA);  Authorized by: Owens Loffler MD;  Method used: Electronically to [facility] [address] [place], [place], [place]  [address], Ph: [phone], Fax: [phone] Observations: Added new observation of MEDS REVIEW: Done (03/17/2010 14:12) Added new observation of ALLERGY REV: Done (03/17/2010 14:12)    Prescriptions: ACCU-CHEK ADVANTAGE TEST  STRP (GLUCOSE BLOOD) test blood sugar 2 times daily  #60 x 5   Entered by:   Zenda Alpers CMA ([place])   Authorized by:   Owens Loffler MD   Signed by:   Zenda Alpers CMA ([place]) on 03/17/2010   Method used:   Electronically to        [facility] (retail)       [place], [place]  [address]       Ph: [phone]       Fax: [phone]   RxID:   [id number]   Current Allergies (reviewed today): * INDERAL (PROPRANOLOL) TENEX (GUANFACINE HCL) * LODINE (ETODOLAC) AVANDIA (ROSIGLITAZONE MALEATE) AMARYL (GLIMEPIRIDE)

## 2010-11-01 NOTE — Assessment & Plan Note (Signed)
Summary: 1 MO ROV /NWS  #   Vital Signs:  Patient profile:   60 year old female Height:      64 inches (162.56 cm) Weight:      148.25 pounds (67.39 kg) O2 Sat:      96 % on Room air Temp:     97.8 degrees F (36.56 degrees C) oral Pulse rate:   98 / minute BP sitting:   120 / 70  (left arm) Cuff size:   regular  Vitals Entered By: Gardenia Phlegm RMA (January 11, 2010 3:54 PM)  O2 Flow:  Room air CC: 1 month follow up/ CF Is Patient Diabetic? Yes   Primary Provider:  Owens Loffler, MD  CC:  1 month follow up/ CF.  History of Present Illness: she brings a record of her cbg's which i have reviewed today.  she does not check at hs, but it is 180-240 at all other times of day, with no trend.  pt states she feels well in general.  Current Medications (verified): 1)  Tramadol Hcl 50 Mg Tabs (Tramadol Hcl) .... Take 1 Tablet By  A Day As Needed 2)  Trazodone Hcl 150 Mg  Tabs (Trazodone Hcl) .... Take 1/2 By Mouth At Bedtime 3)  Lantus Solostar 100 Unit/ml  Soln (Insulin Glargine) .... 45 Units Once A Day 4)  Lisinopril 40 Mg Tabs (Lisinopril) .... Take One Tablet By Mouth Daily 5)  Align  Caps (Misc Intestinal Flora Regulat) .Marland Kitchen.. 1 Daily 6)  Aspirin 81 Mg Tbec (Aspirin) .... Take One Tablet By Mouth Daily 7)  Carvedilol 25 Mg Tabs (Carvedilol) .Marland Kitchen.. 1 By Mouth Two Times A Day 8)  Humalog Kwikpen 100 Unit/ml Soln (Insulin Lispro (Human)) .... Three Times A Day (Qac) 35-40-45 Units 9)  Novofine 32g X 6 Mm Misc (Insulin Pen Needle) .... Use Per Directions With Solastar 10)  Colchicine 0.6 Mg Tabs (Colchicine) .Marland Kitchen.. 1 Daily To Two Times A Day As Needed For Gout Flare 11)  Pravachol 10 Mg Tabs (Pravastatin Sodium) .... 1/2 Tablet  By Mouth At Bedtime 12)  Coq10 50 Mg Caps (Coenzyme Q10) .... Once Daily  Allergies (verified): 1)  * Inderal (Propranolol) 2)  Tenex (Guanfacine Hcl) 3)  * Lodine (Etodolac) 4)  Avandia (Rosiglitazone Maleate) 5)  Amaryl (Glimepiride)  Past  History:  Past Medical History: Last updated: 07/12/2009 1. Diabetes, on insulin.  2. Diverticulitis. 3. Hypertension. 4. Osteoarthritis. 5. Symptomatic cholelithiasis. 6. Chronic left bundle branch block, this was first noted about 10 years ago. 7. Irritable bowel syndrome. 8. Mild nonischemic cardiomyopathy.  The patient had an echo done in March 2010 showing EF of 40%, inferior hypokinesis, mild LVH, septal dyssynergy, and no significant valvular problems.  Left heart catheterization was done to assess for coronary disease and the cause of cardiomyopathy, LVEDP was 16 mmHg, EF was 45%.  There were mild luminal irregularities only in the coronaries.  It is possible that mild cardiomyopathy is due to poorly controlled diabetes. Most recent echo (10/10) with EF 50-55%, no regional wall motion abnormalities, LV dyssynergy consistent with LBBB.  9. Allergic rhinitis 10. IRRITABLE BOWEL SYNDROME (ICD-564.1) 11. GOUT (ICD-274.9) 12. Hyperlipidemia   [facility] Dr Jeannett Senior Dr [phone] Dr Maryellen Pile  [phone]  Review of Systems  The patient denies syncope.    Physical Exam  General:  normal appearance.   Psych:  Alert and cooperative; normal mood and affect; normal attention span and concentration.     Impression & Recommendations:  Problem #  1:  DIABETES MELLITUS, TYPE II (ICD-250.00) needs increased rx  Medications Added to Medication List This Visit: 1)  Lantus Solostar 100 Unit/ml Soln (Insulin glargine) .... 55 units once a day 2)  Humalog Kwikpen 100 Unit/ml Soln (Insulin lispro (human)) .... Three times a day (qac) 45-50-50 units  Other Orders: Est. Patient Level III (75170)  Patient Instructions: 1)  increase lantus to 55 units per day. 2)  increase humalog to (just before each meal) 45-50-50 units. 3)  Please schedule a follow-up appointment in 1 month. 4)  check your blood sugar 4 times a day--before the 3 meals, and at bedtime.  also check if you  have symptoms of your blood sugar being too high or too low.  please keep a record of the readings and bring it to your next appointment here.  please call us sooner if you are having low blood sugar episodes.
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
## 2010-11-01 NOTE — Medication Information (Signed)
Summary: Letter Regarding Adding a Statin/[employer]  Letter Regarding Adding a Statin/[employer] Mgmt   Imported By: Edmonia James 12/13/2009 11:15:33  _____________________________________________________________________  External Attachment:    Type:   Image     Comment:   External Document

## 2010-11-01 NOTE — Miscellaneous (Signed)
Summary: med list update- test strips  Medications Added ONETOUCH ULTRA TEST  STRP (GLUCOSE BLOOD) check blood sugar up to 4 times a day       Clinical Lists Changes  Medications: Changed medication from ACCU-CHEK ADVANTAGE TEST  STRP (GLUCOSE BLOOD) test blood sugar 2 times daily to [facility] ULTRA TEST  STRP (GLUCOSE BLOOD) check blood sugar up to 4 times a day     Prior Medications: TRAMADOL HCL 50 MG TABS (TRAMADOL HCL) Take 1 tablet by  a day as needed TRAZODONE HCL 150 MG  TABS (TRAZODONE HCL) Take 1/2 by mouth at bedtime LANTUS SOLOSTAR 100 UNIT/ML  SOLN (INSULIN GLARGINE) 25 units two times a day LISINOPRIL 40 MG TABS (LISINOPRIL) Take one tablet by mouth daily ALIGN  CAPS (MISC INTESTINAL FLORA REGULAT) 1 daily ASPIRIN 81 MG TBEC (ASPIRIN) Take one tablet by mouth daily CARVEDILOL 25 MG TABS (CARVEDILOL) 1 by mouth two times a day HUMALOG KWIKPEN 100 UNIT/ML SOLN (INSULIN LISPRO (HUMAN)) three times a day (qac) 38-42-50 units NOVOFINE 32G X 6 MM MISC (INSULIN PEN NEEDLE) use per directions with solastar COLCHICINE 0.6 MG TABS (COLCHICINE) 1 daily to two times a day as needed for gout flare PRAVACHOL 10 MG TABS (PRAVASTATIN SODIUM) 1/2 tablet  by mouth at bedtime COQ10 50 MG CAPS (COENZYME Q10) once daily Current Allergies: * INDERAL (PROPRANOLOL) TENEX (GUANFACINE HCL) * LODINE (ETODOLAC) AVANDIA (ROSIGLITAZONE MALEATE) AMARYL (GLIMEPIRIDE)

## 2010-11-01 NOTE — Assessment & Plan Note (Signed)
Summary: 6 WK ROV /NWS  #   Vital Signs:  Patient profile:   60 year old female Height:      64 inches (162.56 cm) Weight:      151 pounds (68.64 kg) BMI:     26.01 O2 Sat:      96 % on Room air Temp:     98.7 degrees F (37.06 degrees C) oral Pulse rate:   97 / minute Pulse rhythm:   regular BP sitting:   114 / 68  (left arm) Cuff size:   regular  Vitals Entered By: Rebeca Alert MA (March 22, 2010 3:48 PM)  O2 Flow:  Room air CC: endo 6 wk f/u/pt states she can take up to 38 U of insulin in the AM without feeling sick/aj   Primary Provider:  Owens Loffler, MD  CC:  endo 6 wk f/u/pt states she can take up to 38 U of insulin in the AM without feeling sick/aj.  History of Present Illness: pt says she has hypoglycemia before lunch, or before supper.  she has had to reduce humalog to three times a day (just before each meal) 38-42-50 units.  cbg's are higher in am than at hs.  she feels this is due to hs-snack.  she says she has to spread out the lantus to 25 units two times a day, to avoid nocturnal hypoglycemia.   pt states few weeks of sight pain at the suprapubic area.  no assoc dysuria.  Current Medications (verified): 1)  Tramadol Hcl 50 Mg Tabs (Tramadol Hcl) .... Take 1 Tablet By  A Day As Needed 2)  Trazodone Hcl 150 Mg  Tabs (Trazodone Hcl) .... Take 1/2 By Mouth At Bedtime 3)  Lantus Solostar 100 Unit/ml  Soln (Insulin Glargine) .... 45 Units Once A Day 4)  Lisinopril 40 Mg Tabs (Lisinopril) .... Take One Tablet By Mouth Daily 5)  Align  Caps (Misc Intestinal Flora Regulat) .Marland Kitchen.. 1 Daily 6)  Aspirin 81 Mg Tbec (Aspirin) .... Take One Tablet By Mouth Daily 7)  Carvedilol 25 Mg Tabs (Carvedilol) .Marland Kitchen.. 1 By Mouth Two Times A Day 8)  Humalog Kwikpen 100 Unit/ml Soln (Insulin Lispro (Human)) .... Three Times A Day (Qac) 40-50-60 Units 9)  Novofine 32g X 6 Mm Misc (Insulin Pen Needle) .... Use Per Directions With Solastar 10)  Colchicine 0.6 Mg Tabs (Colchicine) .Marland Kitchen.. 1 Daily  To Two Times A Day As Needed For Gout Flare 11)  Pravachol 10 Mg Tabs (Pravastatin Sodium) .... 1/2 Tablet  By Mouth At Bedtime 12)  Coq10 50 Mg Caps (Coenzyme Q10) .... Once Daily 13)  Accu-Chek Advantage Test  Strp (Glucose Blood) .... Test Blood Sugar 2 Times Daily  Allergies (verified): 1)  * Inderal (Propranolol) 2)  Tenex (Guanfacine Hcl) 3)  * Lodine (Etodolac) 4)  Avandia (Rosiglitazone Maleate) 5)  Amaryl (Glimepiride)  Past History:  Past Medical History: Last updated: 07/12/2009 1. Diabetes, on insulin.  2. Diverticulitis. 3. Hypertension. 4. Osteoarthritis. 5. Symptomatic cholelithiasis. 6. Chronic left bundle branch block, this was first noted about 10 years ago. 7. Irritable bowel syndrome. 8. Mild nonischemic cardiomyopathy.  The patient had an echo done in March 2010 showing EF of 40%, inferior hypokinesis, mild LVH, septal dyssynergy, and no significant valvular problems.  Left heart catheterization was done to assess for coronary disease and the cause of cardiomyopathy, LVEDP was 16 mmHg, EF was 45%.  There were mild luminal irregularities only in the coronaries.  It is possible  that mild cardiomyopathy is due to poorly controlled diabetes. Most recent echo (10/10) with EF 50-55%, no regional wall motion abnormalities, LV dyssynergy consistent with LBBB.  9. Allergic rhinitis 10. IRRITABLE BOWEL SYNDROME (ICD-564.1) 11. GOUT (ICD-274.9) 12. Hyperlipidemia   [facility] Dr Jeannett Senior Dr [phone] Dr Maryellen Pile  [phone]  Review of Systems  The patient denies fever and syncope.    Physical Exam  General:  normal appearance.   Abdomen:  no suprapubic tenderness Additional Exam:  Hemoglobin A1C       [H]  8.5 %   Impression & Recommendations:  Problem # 1:  DIABETES MELLITUS, TYPE II (ICD-250.00) needs increased rx  Problem # 2:  HEMATURIA UNSPECIFIED (ICD-599.70) Assessment: New ? uti  Medications Added to Medication List This  Visit: 1)  Lantus Solostar 100 Unit/ml Soln (Insulin glargine) .... 25 units two times a day 2)  Humalog Kwikpen 100 Unit/ml Soln (Insulin lispro (human)) .... Three times a day (qac) 38-42-50 units 3)  Ciprofloxacin Hcl 500 Mg Tabs (Ciprofloxacin hcl) .Marland Kitchen.. 1 tab two times a day  Other Orders: T-Urine Culture (Spectrum Order) [id number]) TLB-A1C / Hgb A1C (Glycohemoglobin) (83036-A1C) Est. Patient Level IV (97948)  Patient Instructions: 1)  take lantus 25 units two times a day 2)  decrease humalog to (just before each meal) 38-42-50 units. 3)  Please schedule a follow-up appointment in 6 weeks. 4)  check your blood sugar 4 times a day--before the 3 meals, and at bedtime.  also check if you have symptoms of your blood sugar being too high or too low.  please keep a record of the readings and bring it to your next appointment here.  please call us sooner if you are having low blood sugar episodes. 5)  you should consider a continuous glucose monitor. 6)  try cipro 500 mg two times a day 7)  please see dr copland if urinary symptoms persist.   8)  (update: i left message on phone-tree:  i advised continuous glucose monitor.  however, this usually shows the need for more humalog and less lantus) Prescriptions: CIPROFLOXACIN HCL 500 MG TABS (CIPROFLOXACIN HCL) 1 tab two times a day  #14 x 0   Entered and Authorized by:   Donavan Foil MD   Signed by:   Donavan Foil MD on 03/22/2010   Method used:   Electronically to        [facility] (retail)       [place], [place]  [address]       Ph: [phone]       Fax: [phone]   RxID:   [id number]   Laboratory Results   Urine Tests    Routine Urinalysis   Color: yellow Appearance: Clear Glucose: negative   (Normal Range: Negative) Bilirubin: negative   (Normal Range: Negative) Ketone: negative   (Normal Range: Negative) Spec. Gravity: 1.020   (Normal Range: 1.003-1.035) Blood:  large   (Normal Range: Negative) pH: 5.0   (Normal Range: 5.0-8.0) Protein: trace   (Normal Range: Negative) Urobilinogen: 0.2   (Normal Range: 0-1) Nitrite: negative   (Normal Range: Negative) Leukocyte Esterace: negative   (Normal Range: Negative)
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
## 2010-11-03 NOTE — Progress Notes (Signed)
Summary: rx refill req  Phone Note Refill Request Message from:  Fax from Pharmacy on October 24, 2010 2:28 PM  Refills Requested: Medication #1:  HUMALOG KWIKPEN 100 UNIT/ML SOLN three times a day (qac) 40-50-60 units   Dosage confirmed as above?Dosage Confirmed   Last Refilled: 08/31/2010  Method Requested: Electronic Next Appointment Scheduled: none Initial call taken by: Rebeca Alert CMA Deborra Medina),  October 24, 2010 2:29 PM    Prescriptions: HUMALOG KWIKPEN 100 UNIT/ML SOLN (INSULIN LISPRO (HUMAN)) three times a day (qac) 40-50-60 units  #1 month x 1   Entered by:   Rebeca Alert CMA ([place])   Authorized by:   Donavan Foil MD   Signed by:   Rebeca Alert CMA ([place]) on 10/24/2010   Method used:   Electronically to        [facility] (retail)       [address]       [place], [place]  [address]       Ph: [phone]       Fax: [phone]   RxID:   [id number]

## 2010-11-03 NOTE — Progress Notes (Signed)
Summary: trazadone  Phone Note Refill Request Message from:  [place] on October 24, 2010 11:43 AM  Refills Requested: Medication #1:  TRAZODONE HCL 150 MG  TABS Take 1/2 by mouth at bedtime   Supply Requested: 1 month [place]   Method Requested: Telephone to Pharmacy Initial call taken by: Zenda Alpers CMA Deborra Medina),  October 24, 2010 11:44 AM    Prescriptions: TRAZODONE HCL 150 MG  TABS (TRAZODONE HCL) Take 1/2 by mouth at bedtime  #30 Tablet x 9   Entered and Authorized by:   Owens Loffler MD   Signed by:   Owens Loffler MD on 10/24/2010   Method used:   Electronically to        [facility] (retail)       [place], [place]  [address]       Ph: [phone]       Fax: [phone]   RxID:   [id number]
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
## 2010-11-17 NOTE — Letter (Signed)
Summary: [facility] note  [facility] note   Imported By: Jamelle Haring 11/07/2010 10:13:33  _____________________________________________________________________  External Attachment:    Type:   Image     Comment:   External Document

## 2010-11-23 ENCOUNTER — Encounter: Payer: Self-pay | Admitting: Family Medicine

## 2010-12-06 ENCOUNTER — Encounter: Payer: Self-pay | Admitting: Cardiovascular Disease

## 2010-12-13 NOTE — Letter (Signed)
Summary: [facility]  [facility]   Imported By: Laural Benes 12/08/2010 13:30:15  _____________________________________________________________________  External Attachment:    Type:   Image     Comment:   External Document

## 2010-12-23 ENCOUNTER — Other Ambulatory Visit: Payer: Self-pay | Admitting: Endocrinology

## 2010-12-23 DIAGNOSIS — E119 Type 2 diabetes mellitus without complications: Secondary | ICD-10-CM

## 2010-12-28 ENCOUNTER — Other Ambulatory Visit: Payer: Self-pay | Admitting: *Deleted

## 2010-12-28 MED ORDER — CARVEDILOL 25 MG PO TABS: 25.0000 mg | ORAL_TABLET | Freq: Two times a day (BID) | ORAL | Status: DC

## 2011-02-07 ENCOUNTER — Encounter: Payer: Self-pay | Admitting: Cardiovascular Disease

## 2011-02-07 ENCOUNTER — Ambulatory Visit (INDEPENDENT_AMBULATORY_CARE_PROVIDER_SITE_OTHER): Payer: BC Managed Care – PPO | Admitting: Cardiovascular Disease

## 2011-02-07 DIAGNOSIS — I10 Essential (primary) hypertension: Secondary | ICD-10-CM

## 2011-02-07 DIAGNOSIS — E785 Hyperlipidemia, unspecified: Secondary | ICD-10-CM

## 2011-02-07 DIAGNOSIS — E119 Type 2 diabetes mellitus without complications: Secondary | ICD-10-CM

## 2011-02-07 DIAGNOSIS — I428 Other cardiomyopathies: Secondary | ICD-10-CM

## 2011-02-07 DIAGNOSIS — I447 Left bundle-branch block, unspecified: Secondary | ICD-10-CM

## 2011-02-07 MED ORDER — PRAVASTATIN SODIUM 10 MG PO TABS: 10.0000 mg | ORAL_TABLET | Freq: Every evening | ORAL | Status: DC

## 2011-02-07 NOTE — Assessment & Plan Note (Signed)
Blood pressure is well controlled on today's visit. No changes made to the medications. 

## 2011-02-07 NOTE — Progress Notes (Signed)
   Patient ID: Kristy [name], female    DOB: 13-Jul-1951, 60 y.o.   MRN: [id number]  HPI Comments: 60 yo with HTN, diabetes, and mild nonischemic CM,  history of GI disease/IBS, high cholesterol, remote h/o smoking,  presents for followup.   She reports that she has been doing well. She does have chronic fatigue in the morning. Her sugars have been doing better with last hemoglobin A1c 11.6 now down to 9.3. She saw endocrine at [facility] who has been helping her with her medication changes. Overall she is happy and has no complaints.   Last echo in 10/10 showed EF 50-55% with dysynnergy due to left bundle branch block.    Recent labs from March 2012 shows total cholesterol 191, LDL 105, HDL 30  ECG: normal sinus rhythm with left bundle branch block, rate 79 beats per minute       Review of Systems  Constitutional: Negative.   HENT: Negative.   Eyes: Negative.   Respiratory: Negative.   Cardiovascular: Negative.   Gastrointestinal: Negative.   Musculoskeletal: Negative.   Skin: Negative.   Neurological: Negative.   Hematological: Negative.   Psychiatric/Behavioral: Negative.   All other systems reviewed and are negative.    BP 128/70  Pulse 79  Ht 5' 2"  (1.575 m)  Wt 143 lb (64.864 kg)  BMI 26.15 kg/m2   Physical Exam  Nursing note and vitals reviewed. Constitutional: She is oriented to person, place, and time. She appears well-developed and well-nourished.  HENT:  Head: Normocephalic.  Nose: Nose normal.  Mouth/Throat: Oropharynx is clear and moist.  Eyes: Conjunctivae are normal. Pupils are equal, round, and reactive to light.  Neck: Normal range of motion. Neck supple. No JVD present.  Cardiovascular: Normal rate, regular rhythm, S1 normal, S2 normal, normal heart sounds and intact distal pulses.  Exam reveals no gallop and no friction rub.   No murmur heard. Pulmonary/Chest: Effort normal and breath sounds normal. No respiratory distress. She has no wheezes. She  has no rales. She exhibits no tenderness.  Abdominal: Soft. Bowel sounds are normal. She exhibits no distension. There is no tenderness.  Musculoskeletal: Normal range of motion. She exhibits no edema and no tenderness.  Lymphadenopathy:    She has no cervical adenopathy.  Neurological: She is alert and oriented to person, place, and time. Coordination normal.  Skin: Skin is warm and dry. No rash noted. No erythema.  Psychiatric: She has a normal mood and affect. Her behavior is normal. Judgment and thought content normal.         Assessment and Plan

## 2011-02-07 NOTE — Patient Instructions (Signed)
You are doing well. No medication changes were made. Please call us if you have new issues that need to be addressed before your next appt.  We will call you for a follow up Appt. In 12 months

## 2011-02-07 NOTE — Assessment & Plan Note (Signed)
Cholesterol has improved. I suspect this will get even better with her improved diabetes control and weight loss. She is not currently on medication.Marland Kitchen

## 2011-02-07 NOTE — Assessment & Plan Note (Signed)
Hemoglobin A1c still greater than 9. This has improved from 11. She is working closely with Dr. Gabriel Carina.

## 2011-02-07 NOTE — Assessment & Plan Note (Signed)
Ejection fraction close to normal. Encouraged her to stay on her beta blocker and ACE inhibitor.

## 2011-02-14 NOTE — Assessment & Plan Note (Signed)
[facility] OFFICE NOTE   CHIZARA, MENA                    MRN:          [id number]  DATE:01/18/2009                            DOB:          May 21, 1951    PRIMARY CARE PHYSICIAN:  Venia Carbon, MD   HISTORY OF PRESENT ILLNESS:  This is a 60 year old with a history of  diabetes, hypertension, and chronic left bundle branch block who  initially came to our attention for evaluation of tachycardia of  uncertain etiology.  As part of the workup, the patient did have an  echocardiogram, which showed an EF of 40% along with inferior  hypokinesis.  There were no significant valvular problems.  Because of  this abnormal echocardiogram, the patient was referred over to get a  left heart catheterization to see if coronary disease was the cause of  her cardiomyopathy given her multiple risk factors for coronary artery  disease, which included hypertension, hyperlipidemia, and diabetes.  The  patient has had left heart catheterization done by myself in April 2010.  LV gram showed an EF of 45%.  Coronary arteries showed mild luminal  irregularities only.  Since that time, the patient has been doing quite  well.  She is no longer tachycardic.  I did put her on a low-dose of  carvedilol when I saw her in the cath lab.  She continues to have  excellent exercise tolerance.  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  She has no shortness of breath and no chest pain with  exertion.  Dr. Silvio Pate is working on her blood glucose control.  She says  her blood sugar is still running high in the morning at 160s-170s;  however, it is improving some.   PAST MEDICAL HISTORY:  1. Type 2 diabetes.  2. Diverticulitis.  3. Hypertension.  4. Osteoarthritis.  5. Symptomatic cholelithiasis.  6. Chronic left bundle branch block, this was first  noted about 10      years ago.  7. Irritable bowel syndrome.  8. Mild nonischemic cardiomyopathy.  The patient had an echo done in      March 2010 showing EF of 40%, inferior hypokinesis, mild LVH,      septal dyssynergy, and no significant valvular problems.  Left      heart catheterization was done to assess for coronary disease and      the cause of cardiomyopathy, LVEDP was 16 mmHg, EF was 45%.  There      were mild luminal irregularities only in the coronaries.  It is      possible that mild cardiomyopathy is due to poorly controlled      diabetes.  9. The patient had a Holter monitor done in March 2010, showing only      PACs and PVCs occasionally, mean heart rate was 90 beats per      minute.   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  She has a very active job.  She quit  smoking in 2001.  She is married.  No children.  She does not drink  alcohol.   LABORATORY DATA:  Most recent labs from April 2010, creatinine 1.04, HDL  33, triglycerides 254, LDL 93.   MEDICATIONS:  1. Coreg 3.125 mg b.i.d.  2. Fish oil.  3. Glipizide XL 5 mg daily.  4. Glucophage 500 mg q.i.d.  5. Mobic.  6. Lantus.  7. Lisinopril 20/12.5 mg daily.  8. Zocor 20 mg daily.  9. Aspirin 81 mg daily.   PHYSICAL EXAMINATION:  VITAL SIGNS:  Blood pressure is 122/70, heart  rate is 84 and regular.  GENERAL:  This a well-developed female in no apparent distress.  NEUROLOGIC:  Alert and oriented x3.  Normal affect.  NECK:  There is no JVD.  There is no thyromegaly or thyroid nodule.  CARDIOVASCULAR:  Regular.  S1 and S2.  Paradoxically split S2.  There is  no murmur.  There is no peripheral edema.  EXTREMITIES:  There are 2+ posterior tibial pulses bilaterally.  There  is no carotid bruit.  ABDOMEN:  Soft, nontender.  No hepatosplenomegaly.  Normal bowel sounds.   ASSESSMENT AND PLAN:  This is a 60 year old with a history of   hypertension, diabetes, and chronic left bundle branch block who we are  following for a mild nonischemic cardiomyopathy.  1. Nonischemic cardiomyopathy.  The patient does have a mild      nonischemic cardiomyopathy.  Ejection fraction was 40% on echo and      45% on left ventriculogram.  She does have some septal dyssynergy      from her left bundle branch block which may be contributing to the      observed low left ventricular function.  The mildly decreased left      ventricular systolic function may be related to poor control of her      diabetes, though this is improving.  She is slowly getting her      blood sugars under control.  She is feeling good today with no      significant symptoms.  She is New York Heart Association class I.      We will increase her carvedilol today to 6.25 mg twice a day.  I      will leave it at that level.  She is also on lisinopril 20 mg a day      which she will continue.  We will see her in followup in the office      in 6 months and repeat an echocardiogram in about a year to make      sure that her left ventricular function is not getting any worse      and hopefully is improving.  2. Hypertension.  Blood pressure is under good control today.  We will      continue on her current medications.  3. Diabetes.  The patient's blood sugar is still elevated in the      morning.  Dr. Silvio Pate is titrating her medications.  4. Hyperlipidemia.  The patient is on Zocor 20 mg daily.  We will      check her lipids and LFTs in 3 months.  5. Coronary disease risk.  The patient has only luminal irregularities      in her coronaries; however, she has multiple risk factors including      diabetes, I recommended she take an aspirin 81 mg daily.  6. I think if the patient needs to have her gallbladder removed that      should not be a problem.  She should continue her beta-blocker      around the time of the procedure.  7. The patient's Holter monitor was reviewed and  showed only premature      atrial contractions and premature ventricular contractions.  Her      average heart rate was 90 beats per minute.     Loralie Champagne, MD  Electronically Signed    DM/MedQ  DD: 01/18/2009  DT: 01/19/2009  Job #: [id number]   cc:   Venia Carbon, MD
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
## 2011-02-14 NOTE — Cardiovascular Report (Signed)
NAME:  Kristy [name], Kristy [name] NO.:  [id number]   MEDICAL RECORD NO.:  [id number]          PATIENT TYPE:  OIB   LOCATION:  1961                         FACILITY:  [facility]   PHYSICIAN:  Loralie Champagne, MD      DATE OF BIRTH:  1951/05/25   DATE OF PROCEDURE:  01/07/2009  DATE OF DISCHARGE:  01/07/2009                            CARDIAC CATHETERIZATION   PRIMARY CARE PHYSICIAN:  Venia Carbon, MD   PROCEDURES:  1. Left heart catheterization.  2. Coronary angiography.  3. Left ventriculography.   INDICATION:  This is a 60 year old with a history of diabetes,  hypertension, and hyperlipidemia who was noted on echocardiography to  have a mild to moderately depressed ejection fraction of 40% in the  setting of a left bundle-branch block.  This study was done to assess  for coronary artery disease as a cause of her cardiomyopathy.   PROCEDURE NOTE:  After informed consent was obtained, the right groin  was sterilely prepped and draped.  Lidocaine 1% was used to locally  anesthetize the right groin area.  The right common femoral artery was  entered using Seldinger technique and a 4-French arterial sheath was  placed.  The left coronary artery was engaged using the 4-French JL4  catheter.  The right coronary artery was engaged using a 4-French 3DRC  catheter, and the left ventricle was entered using the 4-French angled  pigtail catheter.  There were no complications.   FINDINGS:  1. Hemodynamics:  LV 100/10/16.  Aorta 100/57/75.  2. Left ventriculography:  On LV gram, EF was estimated to be about      45%.  There was hypokinesis of the inferior wall.  3. Coronary angiography:  The coronary system was right dominant.  The      left main was free from significant disease.  The LAD was free from      significant disease.  There were mild luminal irregularities in the      left circumflex artery.  The right coronary artery also had mild      luminal irregularities.   ASSESSMENT:  There is no significant coronary artery disease.  The left  ventricle ejection fraction is mildly decreased, estimated to be about  45%.  This does appear to be a mild non-ischemic cardiomyopathy.  This  may be related to the patient's diabetes with small-vessel disease.  We  will plan on starting the patient on carvedilol 3.125 mg twice a day  today.  She is already on lisinopril.  We will try to titrate up the  carvedilol as tolerated.  I will see the patient back in the office in 7-  14 days.     Loralie Champagne, MD  Electronically Signed    DM/MEDQ  D:  01/07/2009  T:  01/07/2009  Job:  [id number]   cc:   Venia Carbon, MD

## 2011-02-14 NOTE — Assessment & Plan Note (Signed)
[facility] OFFICE NOTE   KALESE, Kristy [name]                    MRN:          [id number]  DATE:12/23/2008                            DOB:          [birth year]    REFERRING PHYSICIAN:  Kathryne Eriksson, MD   PRIMARY CARE PHYSICIAN:  Venia Carbon, MD   HISTORY OF PRESENT ILLNESS:  This is a 60 year old with a history of  diabetes, hypertension and chronic left bundle-branch block who presents  for evaluation of tachycardia.  The patient has been in her usual state  of health lately.  However, she has had several years of right flank  pain that has been attributed to symptomatic cholelithiasis.  She gets  pain in her right flank after eating meats of fatty foods.  She went to  see the surgeons at [facility], who offered removal of her  gallbladder because of her symptoms.  However, when she went in to see  the surgeons, her heart rate was elevated, her blood pressure was  elevated, and her blood sugar was elevated, so they sent her back to her  primary care physician for evaluation.  She was seen by Dr. Lorelei Pont in  the office last week.  At that time, she was noted to have a heart rate  in the 110s-120s.  It was sinus tachycardia.  She has actually been  doing quite well recently symptomatically.  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  She does a lot of  walking.  She has a pedometer, and she goes 5+ miles per day.  She  climbs steps all day long.  She says that she has excellent exercise  tolerance and no shortness of breath at all with exertion.  She has had  no episodes of chest pain or tightness.  In general, she seems to be in  good condition.  Dr. Lorelei Pont did increase her lisinopril, which she is  now taking at 20 mg a day.  With that change, her blood pressure seems  to be under good control today at 122/70; however, her heart rate is  still  high at 115.  I do note that she has a left bundle-branch block.  She apparently has a history of left bundle-branch block for the last,  at least, 10 years.  This was found, as mentioned, about 10 years ago.  She was seen by Dr. Verl Blalock at that time.  She had a Persantine Cardiolite  done in January 2000, which showed no evidence for ischemia.  This  stress test does appear to be done to evaluate her left bundle-branch  block.  The patient denies any significant caffeine intake.  The only  new medicine she has been on recently is chlorpheniramine for her  allergies.  She says, however, that she started taking the  chlorpheniramine after she saw Dr. Lorelei Pont, so this seems unlikely to be  able to explain her tachycardia.   PAST MEDICAL HISTORY:  1. Type 2 diabetes.  2. Diverticulitis.  3. Hypertension.  4. Osteoarthritis.  5.  Symptomatic cholelithiasis.  6. Chronic left bundle-branch block.  This was first noted at least 10      years ago.  The patient did have a Persantine Cardiolite done in      January 2000 because of her left bundle-branch block.  This was      negative for ischemia or infarction.  7. Irritable bowel syndrome.   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  She has a very active job.  She quit  smoking in 2001.  She is married.  No children.  She does not drink  alcohol.   FAMILY HISTORY:  There is a strong family history of hypertension.  Also, the patient says that her uncle died at the age of 62 from a large  heart.   REVIEW OF SYSTEMS:  Negative, except as noted in the history of present  illness.   LABORATORY STUDIES:  From March; creatinine 0.9, AST 43, ALT 55, and  hemoglobin A1c 8.5.   EKG shows sinus tachycardia at a rate of 115 with a left bundle-branch  block.   PHYSICAL EXAMINATION:  VITAL SIGNS:  Blood pressure 122/70 and heart  rate is 115 and regular.  GENERAL:  This is a  well-developed female in no apparent stress.  NEUROLOGIC:  Alert and oriented x3.  Normal affect.  NECK:  There is no JVD.  There is no thyromegaly or thyroid nodule.  CARDIOVASCULAR:  Heart, tachycardic.  Regular S1 and S2.  There is a  paradoxically split S2.  There is no murmur.  There is no peripheral  edema.  There are 2+ posterior tibial pulses bilaterally.  There is no  carotid bruit.  ABDOMEN:  Soft, nontender.  No hepatosplenomegaly.  EXTREMITIES:  No clubbing or cyanosis.  SKIN:  Normal exam.  MUSCULOSKELETAL:  Normal exam.  HEENT:  Normal exam.   ASSESSMENT AND PLAN:  This is a 60 year old with a history of diabetes,  hypertension and chronic left bundle-branch block, who presents for  evaluation of tachycardia.  1. Tachycardia.  The patient does tell me that she has been known to      have an elevated heart rate for many years.  However, looking back      through the prior office notes, it appears that her heart rate is      recorded in the 70s to 80s in prior office visits over at [facility].  I am not sure what is causing her elevated heart rate      today.  She has excellent exercise tolerance with no elicitable      cardiopulmonary symptoms.  She does have a left bundle-branch      block.  However, this appears to have been chronic for the last 10      years and this was worked up about 10 years ago with a stress test,      which was negative.  She is on no new medications other than the      chlorpheniramine.  However, it appears she has started the      chlorpheniramine after her visit with Dr. Lorelei Pont, and she had      elevated heart rate prior to starting this med.  However, I will      have her stop the chlorpheniramine to see if that does help.      Additionally, we will get an echocardiogram to ensure  that she has      normal left ventricular function.  We will also set her up for a 48-      hour Holter monitor to see what her average heart rate is  running      and also to see if she has any more significant arrhythmias.  The      best explanation at this time that I have for this is an      inappropriate sinus tachycardia.  I am also going to check a TSH on      this patient, as I do not believe that has been done yet.  2. Hypertension.  The patient's blood pressure is under better control      today.  It is 122/70 after increasing her lisinopril.  3. The patient needs her lipids checked.  We will go ahead and when      she comes back for echocardiogram, to draw fasting lipids.  4. Coronary artery disease risk.  The patient has no ischemic-type      symptoms.  She does have diabetes.  However, I did recommend that      she take aspirin 81 mg daily.  5. We will see the patient back after the Holter monitor and the      echocardiogram were done.  We will see her before her scheduled      surgery in April.     Loralie Champagne, MD  Electronically Signed    DM/MedQ  DD: 12/23/2008  DT: 12/24/2008  Job #: [id number]   cc:   Kathryne Eriksson, MD  Venia Carbon, MD  Isabel Caprice Hassell Done, MD

## 2011-02-17 ENCOUNTER — Other Ambulatory Visit: Payer: Self-pay | Admitting: *Deleted

## 2011-02-17 MED ORDER — TRAMADOL HCL 50 MG PO TABS: 50.0000 mg | ORAL_TABLET | Freq: Four times a day (QID) | ORAL | Status: DC | PRN

## 2011-04-27 ENCOUNTER — Telehealth: Payer: Self-pay

## 2011-04-27 MED ORDER — LISINOPRIL 40 MG PO TABS: 40.0000 mg | ORAL_TABLET | Freq: Every day | ORAL | Status: DC

## 2011-04-27 NOTE — Telephone Encounter (Signed)
Refill sent for lisinopril 40 mg take one tablet daily.

## 2011-05-01 ENCOUNTER — Encounter: Payer: Self-pay | Admitting: Family Medicine

## 2011-05-01 ENCOUNTER — Ambulatory Visit (INDEPENDENT_AMBULATORY_CARE_PROVIDER_SITE_OTHER): Payer: BC Managed Care – PPO | Admitting: Family Medicine

## 2011-05-01 VITALS — BP 104/60 | HR 105 | Temp 98.4°F | Ht 62.5 in | Wt 141.1 lb

## 2011-05-01 DIAGNOSIS — F3342 Major depressive disorder, recurrent, in full remission: Secondary | ICD-10-CM | POA: Insufficient documentation

## 2011-05-01 DIAGNOSIS — F329 Major depressive disorder, single episode, unspecified: Secondary | ICD-10-CM

## 2011-05-01 DIAGNOSIS — E785 Hyperlipidemia, unspecified: Secondary | ICD-10-CM

## 2011-05-01 DIAGNOSIS — F3289 Other specified depressive episodes: Secondary | ICD-10-CM

## 2011-05-01 DIAGNOSIS — E119 Type 2 diabetes mellitus without complications: Secondary | ICD-10-CM

## 2011-05-01 DIAGNOSIS — I10 Essential (primary) hypertension: Secondary | ICD-10-CM

## 2011-05-01 DIAGNOSIS — F32A Depression, unspecified: Secondary | ICD-10-CM

## 2011-05-01 MED ORDER — FLUOXETINE HCL 20 MG PO TABS: 20.0000 mg | ORAL_TABLET | Freq: Every day | ORAL | Status: DC

## 2011-05-01 NOTE — Patient Instructions (Signed)
Recheck in 1 month 

## 2011-05-01 NOTE — Progress Notes (Signed)
[name], a 60 y.o. female presents today in the office for the following:    HTN: Tolerating all medications without side effects Stable and at goal No CP, no sob. No HA.  BP Readings from Last 3 Encounters:  05/01/11 104/60  02/07/11 128/70  10/24/10 536/64    Basic Metabolic Panel:    Component Value Date/Time   NA 136 10/24/2010 1644   K 4.5 10/24/2010 1644   CL 100 10/24/2010 1644   CO2 29 10/24/2010 1644   BUN 16 10/24/2010 1644   CREATININE 1.2 10/24/2010 1644   GLUCOSE 405* 10/24/2010 1644   CALCIUM 9.9 10/24/2010 1644   Lipids: Doing well, stable. Tolerating meds fine with no SE. Panel reviewed with patient.  Lipids:    Component Value Date/Time   CHOL 170 04/26/2009 2202   TRIG 310* 04/26/2009 2202   HDL 33* 04/26/2009 2202   LDLDIRECT 123.3 04/26/2010 1635   VLDL 62* 04/26/2009 2202   CHOLHDL 5.2 Ratio 04/26/2009 2202    Lab Results  Component Value Date   ALT 76* 10/24/2010   AST 77* 10/24/2010   ALKPHOS 115 10/24/2010   BILITOT 0.2* 10/24/2010    DM: +/- compliance with lantus BS now 100-170 with better compliance  Seeing Dr. Gabriel Carina  Depression:  Having some irritability. When not taking 50 mg of trazadone - more irritable.  No crying. Feeling more internal anxiety. Not tolerating the children as much.  Anhedonia Does not want to be in a crowd  Patient Active Problem List  Diagnoses  . DIABETES MELLITUS, TYPE II  . HYPERLIPIDEMIA  . GOUT  . HYPERTENSION  . CARDIOMYOPATHY, PRIMARY  . LEFT BUNDLE BRANCH BLOCK  . ALLERGIC RHINITIS  . IRRITABLE BOWEL SYNDROME  . GALLSTONES  . PSORIASIS  . OSTEOARTHRITIS  . TRANSAMINASES, SERUM, ELEVATED  . DIVERTICULITIS, HX OF   Past Medical History  Diagnosis Date  . Diabetes mellitus   . Hypertension   . Hyperlipidemia   . Gout   . Osteoarthritis   . Symptomatic cholelithiasis   . Left bundle branch block   . IBS (irritable bowel syndrome)   . Cardiomyopathy     mild nonischemic  . Allergic  rhinitis    Past Surgical History  Procedure Date  . Dilation and curettage of uterus   . Vaginal hysterectomy    History  Substance Use Topics  . Smoking status: Never Smoker   . Smokeless tobacco: Never Used  . Alcohol Use: No   Family History  Problem Relation Age of Onset  . Hypertension Father    Allergies  Allergen Reactions  . Glimepiride     REACTION: hypoglycemia  . Guanfacine Hcl     REACTION: unspecified  . Rosiglitazone Maleate     REACTION: unspecified   Current Outpatient Prescriptions on File Prior to Visit  Medication Sig Dispense Refill  . aspirin 81 MG EC tablet Take 81 mg by mouth daily.        . carvedilol (COREG) 25 MG tablet Take 1 tablet (25 mg total) by mouth 2 (two) times daily.  60 tablet  11  . Coenzyme Q10 (COQ10) 50 MG CAPS Take 1 capsule by mouth daily.       Marland Kitchen HUMALOG KWIKPEN 100 UNIT/ML injection USE THREE TIMES A DAY BEFORE MEALS      40 50 60 UNITS  15 mL  3  . Insulin Pen Needle (NOVOFINE) 32G X 6 MM MISC by Does not apply route.        Marland Kitchen  lisinopril (PRINIVIL,ZESTRIL) 40 MG tablet Take 1 tablet (40 mg total) by mouth daily.  30 tablet  6  . Probiotic Product (ALIGN) 4 MG CAPS Take by mouth.        . traMADol (ULTRAM) 50 MG tablet Take 1 tablet (50 mg total) by mouth every 6 (six) hours as needed.  90 tablet  1  . colchicine 0.6 MG tablet Take 0.6 mg by mouth daily.        . pravastatin (PRAVACHOL) 10 MG tablet Take 1 tablet (10 mg total) by mouth every evening.  30 tablet  6   ROS: GEN: No acute illnesses, no fevers, chills. GI: No n/v/d, eating normally Pulm: No SOB Interactive and getting along well at home.  Otherwise, ROS is as per the HPI.   Physical Exam  Blood pressure 104/60, pulse 105, temperature 98.4 F (36.9 C), temperature source Oral, height 5' 2.5" (1.588 m), weight 141 lb 1.9 oz (64.012 kg), SpO2 98.00%.  GEN: WDWN, NAD, Non-toxic, A & O x 3 HEENT: Atraumatic, Normocephalic. Neck supple. No masses, No LAD. Ears  and Nose: No external deformity. CV: RRR, No M/G/R. No JVD. No thrill. No extra heart sounds. PULM: CTA B, no wheezes, crackles, rhonchi. No retractions. No resp. distress. No accessory muscle use. ABD: S, NT, ND, +BS. No rebound tenderness. No HSM.  EXTR: No c/c/e NEURO Normal gait.  PSYCH: Normally interactive. Conversant. Not depressed or anxious appearing.  Calm demeanor.

## 2011-05-30 ENCOUNTER — Ambulatory Visit (INDEPENDENT_AMBULATORY_CARE_PROVIDER_SITE_OTHER): Payer: BC Managed Care – PPO | Admitting: Family Medicine

## 2011-05-30 ENCOUNTER — Encounter: Payer: Self-pay | Admitting: Family Medicine

## 2011-05-30 DIAGNOSIS — F3289 Other specified depressive episodes: Secondary | ICD-10-CM

## 2011-05-30 DIAGNOSIS — F329 Major depressive disorder, single episode, unspecified: Secondary | ICD-10-CM

## 2011-05-30 DIAGNOSIS — F32A Depression, unspecified: Secondary | ICD-10-CM

## 2011-05-30 NOTE — Progress Notes (Signed)
  Subjective:    Patient ID: Kristy [name], female    DOB: 1950-11-09, 60 y.o.   MRN: [id number]  HPI    Review of Systems     Objective:   Physical Exam        Assessment & Plan:   1. Depression    >15 minutes spent in face to face time with patient, >50% spent in counselling or coordination of care: she is doing well. After she had stopped her trazodone, she felt pretty bad, out of sorts, irritable and felt depressed. Had been on this for many years. After starting Prozac she felt better after about 3 days, and now feels great and back to normal.  BS has been running much better as well.

## 2011-06-07 ENCOUNTER — Other Ambulatory Visit: Payer: Self-pay | Admitting: *Deleted

## 2011-06-07 MED ORDER — GLUCOSE BLOOD VI STRP: ORAL_STRIP | Status: AC

## 2011-07-03 ENCOUNTER — Other Ambulatory Visit: Payer: Self-pay | Admitting: Family Medicine

## 2011-12-01 ENCOUNTER — Other Ambulatory Visit: Payer: Self-pay

## 2011-12-01 MED ORDER — LISINOPRIL 40 MG PO TABS: 40.0000 mg | ORAL_TABLET | Freq: Every day | ORAL | Status: DC

## 2011-12-05 ENCOUNTER — Ambulatory Visit: Payer: BC Managed Care – PPO | Admitting: Family Medicine

## 2011-12-06 ENCOUNTER — Ambulatory Visit: Payer: BC Managed Care – PPO | Admitting: Family Medicine

## 2012-01-02 ENCOUNTER — Other Ambulatory Visit: Payer: Self-pay | Admitting: *Deleted

## 2012-01-02 MED ORDER — CARVEDILOL 25 MG PO TABS: 25.0000 mg | ORAL_TABLET | Freq: Two times a day (BID) | ORAL | Status: DC

## 2012-01-03 ENCOUNTER — Encounter: Payer: Self-pay | Admitting: Family Medicine

## 2012-01-03 ENCOUNTER — Ambulatory Visit (INDEPENDENT_AMBULATORY_CARE_PROVIDER_SITE_OTHER): Payer: BC Managed Care – PPO | Admitting: Family Medicine

## 2012-01-03 VITALS — BP 122/72 | HR 97 | Temp 98.6°F | Ht 62.0 in | Wt 142.0 lb

## 2012-01-03 DIAGNOSIS — F3289 Other specified depressive episodes: Secondary | ICD-10-CM

## 2012-01-03 DIAGNOSIS — F329 Major depressive disorder, single episode, unspecified: Secondary | ICD-10-CM

## 2012-01-03 DIAGNOSIS — I10 Essential (primary) hypertension: Secondary | ICD-10-CM

## 2012-01-03 DIAGNOSIS — F32A Depression, unspecified: Secondary | ICD-10-CM

## 2012-01-03 DIAGNOSIS — E785 Hyperlipidemia, unspecified: Secondary | ICD-10-CM

## 2012-01-03 DIAGNOSIS — I428 Other cardiomyopathies: Secondary | ICD-10-CM

## 2012-01-03 DIAGNOSIS — E119 Type 2 diabetes mellitus without complications: Secondary | ICD-10-CM

## 2012-01-03 NOTE — Progress Notes (Signed)
  Patient Name: Kristy [name] Date of Birth: March 12, 1951 Age: 61 y.o. Medical Record Number: [id number] Gender: female Date of Encounter: 01/03/2012  History of Present Illness:  Kristy [name] is a 61 y.o. very pleasant female patient who presents with the following:  Dep: doing well, stable on her prozac  DM: last a1c's in 7 range, seeing Dr. Gabriel Carina next week  HTN at goal  HTN: Tolerating all medications without side effects Stable and at goal No CP, no sob. No HA.  BP Readings from Last 3 Encounters:  01/03/12 122/72  05/30/11 110/72  05/01/11 361/22    Basic Metabolic Panel:    Component Value Date/Time   NA 136 10/24/2010 1644   K 4.5 10/24/2010 1644   CL 100 10/24/2010 1644   CO2 29 10/24/2010 1644   BUN 16 10/24/2010 1644   CREATININE 1.2 10/24/2010 1644   GLUCOSE 405* 10/24/2010 1644   CALCIUM 9.9 10/24/2010 1644   O/w doing well, occ aches and pains  Breast, mammo: Pap: [facility] -- had a complete hysterectomy, saw [facility] 3 years ago. mammo - [facility]: 4 years ago, Financial [employer]    Past Medical History, Surgical History, Social History, Family History, Problem List, Medications, and Allergies have been reviewed and updated if relevant.  Review of Systems:  GEN: No acute illnesses, no fevers, chills. GI: No n/v/d, eating normally Pulm: No SOB Interactive and getting along well at home.  Otherwise, ROS is as per the HPI.   Physical Examination: Filed Vitals:   01/03/12 1505  BP: 122/72  Pulse: 97  Temp: 98.6 F (37 C)  TempSrc: Oral  Height: 5' 2"  (1.575 m)  Weight: 142 lb (64.411 kg)  SpO2: 98%    Body mass index is 25.97 kg/(m^2).   GEN: WDWN, NAD, Non-toxic, A & O x 3 HEENT: Atraumatic, Normocephalic. Neck supple. No masses, No LAD. Ears and Nose: No external deformity. Breast exam: no masses, some fibrocystic changes CV: RRR, No M/G/R. No JVD. No thrill. No extra heart sounds. PULM: CTA B, no wheezes, crackles, rhonchi. No  retractions. No resp. distress. No accessory muscle use. EXTR: No c/c/e NEURO Normal gait.  PSYCH: Normally interactive. Conversant. Not depressed or anxious appearing.  Calm demeanor.    Assessment and Plan:  1. Depression   2. DIABETES MELLITUS, TYPE II   3. HYPERLIPIDEMIA   4. HYPERTENSION   5. CARDIOMYOPATHY, PRIMARY    Basically doing well. Did her breast exam today. She is going to call and schedule her own mammogram at [facility].  I reviewed her laboratory work from endocrinologist as well. Encouraged diet exercise

## 2012-01-22 ENCOUNTER — Other Ambulatory Visit: Payer: Self-pay | Admitting: *Deleted

## 2012-01-22 MED ORDER — INSULIN PEN NEEDLE 32G X 6 MM MISC: Status: DC

## 2012-02-13 ENCOUNTER — Encounter: Payer: Self-pay | Admitting: Cardiovascular Disease

## 2012-02-13 ENCOUNTER — Ambulatory Visit (INDEPENDENT_AMBULATORY_CARE_PROVIDER_SITE_OTHER): Payer: BC Managed Care – PPO | Admitting: Cardiovascular Disease

## 2012-02-13 DIAGNOSIS — E785 Hyperlipidemia, unspecified: Secondary | ICD-10-CM

## 2012-02-13 DIAGNOSIS — E119 Type 2 diabetes mellitus without complications: Secondary | ICD-10-CM

## 2012-02-13 DIAGNOSIS — I428 Other cardiomyopathies: Secondary | ICD-10-CM

## 2012-02-13 DIAGNOSIS — I10 Essential (primary) hypertension: Secondary | ICD-10-CM

## 2012-02-13 DIAGNOSIS — R079 Chest pain, unspecified: Secondary | ICD-10-CM

## 2012-02-13 DIAGNOSIS — R0602 Shortness of breath: Secondary | ICD-10-CM

## 2012-02-13 NOTE — Assessment & Plan Note (Signed)
We have encouraged continued exercise, careful diet management in an effort to lose weight. She is being managed by endocrine/ Dr. Gabriel Carina. Hemoglobin A1c of 8.

## 2012-02-13 NOTE — Assessment & Plan Note (Signed)
Previous history of cardiomyopathy. Clinically, appears to be stable and euvolemic. We have suggested for her tachycardia, she could increase her Coreg with extra half dose if tolerated.

## 2012-02-13 NOTE — Assessment & Plan Note (Signed)
She'll continue to work on her diabetes and weight loss. Currently not on a statin.

## 2012-02-13 NOTE — Progress Notes (Signed)
Patient ID: Kristy [name], female    DOB: Nov 12, 1950, 61 y.o.   MRN: [id number]  HPI Comments: 61 yo with HTN, diabetes, and mild nonischemic CM,  history of GI disease/IBS, high cholesterol, remote h/o smoking,  presents for followup. Previous cardiac catheterization in 2009 showed mild luminal irregularities. She does have underlying Crohn's disease.   She reports that she has been doing better with her diabetes. She is being managed by Dr. Gabriel Carina at [facility]. Her hemoglobin A1c was in the 7 range, more recently in the 8 range. She is very active, walks up to 5 miles per day with no symptoms of chest pain or shortness of breath. She does have some tachycardia and palpitations at nighttime.   Last echo in 10/10 showed EF 50-55% with dysynnergy due to left bundle branch block.    Recent labs from March 2012 shows total cholesterol 191, LDL 105, HDL 30  ECG: normal sinus rhythm with left bundle branch block, rate 98 beats per minute    Outpatient Encounter Prescriptions as of 02/13/2012  Medication Sig Dispense Refill  . aspirin 81 MG tablet Take 81 mg by mouth daily. Five days a week      . BYDUREON 2 MG SUSR Inject 2 mg into the muscle once a week.       . carvedilol (COREG) 25 MG tablet Take 1 tablet (25 mg total) by mouth 2 (two) times daily.  60 tablet  0  . Coenzyme Q10 (COQ10) 50 MG CAPS Take 1 capsule by mouth daily.       Marland Kitchen FLUoxetine (PROZAC) 20 MG capsule TAKE ONE CAPSULE DAILY  30 capsule  11  . glucose blood (ONE TOUCH ULTRA TEST) test strip PRN  100 each  5  . insulin lispro (HUMALOG KWIKPEN) 100 UNIT/ML injection 6-10 Units.       . Insulin Pen Needle (NOVOFINE) 32G X 6 MM MISC Use as directed  100 each  3  . LANTUS SOLOSTAR 100 UNIT/ML injection Inject 43 Units into the skin at bedtime.       Marland Kitchen lisinopril (PRINIVIL,ZESTRIL) 40 MG tablet Take 1 tablet (40 mg total) by mouth daily.  30 tablet  3  . Probiotic Product (ALIGN) 4 MG CAPS Take by mouth.        . traMADol  (ULTRAM) 50 MG tablet Take 1 tablet (50 mg total) by mouth every 6 (six) hours as needed.  90 tablet  1    Review of Systems  Constitutional: Negative.   HENT: Negative.   Eyes: Negative.   Respiratory: Negative.   Cardiovascular: Negative.   Gastrointestinal: Negative.   Musculoskeletal: Negative.   Skin: Negative.   Neurological: Negative.   Hematological: Negative.   Psychiatric/Behavioral: Negative.   All other systems reviewed and are negative.    BP 123/77  Pulse 98  Ht 5' 2"  (1.575 m)  Wt 141 lb (63.957 kg)  BMI 25.79 kg/m2   Physical Exam  Nursing note and vitals reviewed. Constitutional: She is oriented to person, place, and time. She appears well-developed and well-nourished.  HENT:  Head: Normocephalic.  Nose: Nose normal.  Mouth/Throat: Oropharynx is clear and moist.  Eyes: Conjunctivae are normal. Pupils are equal, round, and reactive to light.  Neck: Normal range of motion. Neck supple. No JVD present.  Cardiovascular: Normal rate, regular rhythm, S1 normal, S2 normal, normal heart sounds and intact distal pulses.  Exam reveals no gallop and no friction rub.   No murmur heard. Pulmonary/Chest:  Effort normal and breath sounds normal. No respiratory distress. She has no wheezes. She has no rales. She exhibits no tenderness.  Abdominal: Soft. Bowel sounds are normal. She exhibits no distension. There is no tenderness.  Musculoskeletal: Normal range of motion. She exhibits no edema and no tenderness.  Lymphadenopathy:    She has no cervical adenopathy.  Neurological: She is alert and oriented to person, place, and time. Coordination normal.  Skin: Skin is warm and dry. No rash noted. No erythema.  Psychiatric: She has a normal mood and affect. Her behavior is normal. Judgment and thought content normal.         Assessment and Plan

## 2012-02-13 NOTE — Patient Instructions (Addendum)
You are doing well. No medication changes were made.  Consider red yeast rice for high cholesterol It is ok to play with higher doses  of cardvedilol  for heart rate control  Please call us if you have new issues that need to be addressed before your next appt.  Your physician wants you to follow-up in: 12 months.  You will receive a reminder letter in the mail two months in advance. If you don't receive a letter, please call our office to schedule the follow-up appointment.

## 2012-02-13 NOTE — Assessment & Plan Note (Signed)
Blood pressure is well-controlled on her current medication regimen. No changes made.

## 2012-02-22 ENCOUNTER — Other Ambulatory Visit: Payer: Self-pay | Admitting: *Deleted

## 2012-02-22 MED ORDER — CARVEDILOL 25 MG PO TABS: 25.0000 mg | ORAL_TABLET | Freq: Two times a day (BID) | ORAL | Status: DC

## 2012-03-12 ENCOUNTER — Other Ambulatory Visit: Payer: Self-pay | Admitting: *Deleted

## 2012-03-12 MED ORDER — COLCHICINE 0.6 MG PO TABS: 0.6000 mg | ORAL_TABLET | Freq: Every day | ORAL | Status: DC

## 2012-03-12 NOTE — Telephone Encounter (Signed)
Patient request refill on colcrys 0.6 mg is it okay to refills? Medication not on medication list.

## 2012-03-12 NOTE — Telephone Encounter (Signed)
Yes colcrys 0.6 mg, 1 po bid prn gout flare. #60, 5 refills

## 2012-03-28 ENCOUNTER — Other Ambulatory Visit: Payer: Self-pay

## 2012-03-28 MED ORDER — LISINOPRIL 40 MG PO TABS: 40.0000 mg | ORAL_TABLET | Freq: Every day | ORAL | Status: DC

## 2012-03-28 NOTE — Telephone Encounter (Signed)
Refill sent for lisinopril 40 mg

## 2012-05-14 ENCOUNTER — Other Ambulatory Visit: Payer: Self-pay | Admitting: Family Medicine

## 2012-06-07 ENCOUNTER — Other Ambulatory Visit: Payer: Self-pay | Admitting: Internal Medicine

## 2012-06-07 MED ORDER — INSULIN PEN NEEDLE 32G X 6 MM MISC: Status: DC

## 2012-07-03 ENCOUNTER — Encounter: Payer: Self-pay | Admitting: Family Medicine

## 2012-07-03 ENCOUNTER — Ambulatory Visit (INDEPENDENT_AMBULATORY_CARE_PROVIDER_SITE_OTHER): Payer: BC Managed Care – PPO | Admitting: Family Medicine

## 2012-07-03 VITALS — BP 120/60 | HR 88 | Temp 98.5°F | Wt 142.0 lb

## 2012-07-03 DIAGNOSIS — F3289 Other specified depressive episodes: Secondary | ICD-10-CM

## 2012-07-03 DIAGNOSIS — F329 Major depressive disorder, single episode, unspecified: Secondary | ICD-10-CM

## 2012-07-03 DIAGNOSIS — I428 Other cardiomyopathies: Secondary | ICD-10-CM

## 2012-07-03 DIAGNOSIS — E785 Hyperlipidemia, unspecified: Secondary | ICD-10-CM

## 2012-07-03 DIAGNOSIS — F32A Depression, unspecified: Secondary | ICD-10-CM

## 2012-07-03 DIAGNOSIS — I447 Left bundle-branch block, unspecified: Secondary | ICD-10-CM

## 2012-07-03 DIAGNOSIS — R7401 Elevation of levels of liver transaminase levels: Secondary | ICD-10-CM

## 2012-07-03 DIAGNOSIS — E119 Type 2 diabetes mellitus without complications: Secondary | ICD-10-CM

## 2012-07-03 NOTE — Progress Notes (Signed)
Therapist, music at [facility] [place] [address] Phone: [phone] Fax: [phone]  Date:  07/03/2012   Name:  Kristy [name]   DOB:  March 26, 1951   MRN:  [id number] Gender: female Age: 61 y.o.  PCP:  Owens Loffler, MD    Chief Complaint: 6 month follow up   History of Present Illness:  Kristy [name] is a 61 y.o. pleasant patient who presents with the following:  Zostavax (had shingles in 30's) - declines Flu - declined  DM ([facility]) - sugars have not been running quite as well. Had some UTI, bladder, and UTI. Went to walk-in.  Her blood sugar was actually 400 last night. She is done relatively well with her diet. She is doing quite a bit better on Bydureon and insulin compared to prior.  HTN: at goal  Lipids: last lipids were relatively at goal with an LDL reported in the 70 range. We will obtain records from the patient's prior physicians. (Dr. Gabriel Carina)  Psoriasis: sees Chyrl Civatte  She also has nonischemic cardiomyopathy and LEFT bundle branch block, but has been very tolerant from her medical management is maxed out on her Coreg. She also continues to take aspirin.she also is on lisinopril 40 mg. Asymptomatic. No chest pain or shortness of breath.  Patient Active Problem List  Diagnosis  . DIABETES MELLITUS, TYPE II  . HYPERLIPIDEMIA  . GOUT  . HYPERTENSION  . CARDIOMYOPATHY, PRIMARY  . LEFT BUNDLE BRANCH BLOCK  . ALLERGIC RHINITIS  . IRRITABLE BOWEL SYNDROME  . GALLSTONES  . PSORIASIS  . OSTEOARTHRITIS  . TRANSAMINASES, SERUM, ELEVATED  . DIVERTICULITIS, HX OF  . Depression    Past Medical History  Diagnosis Date  . Diabetes mellitus   . Hypertension   . Hyperlipidemia   . Gout   . Osteoarthritis   . Symptomatic cholelithiasis   . Left bundle branch block   . IBS (irritable bowel syndrome)   . Cardiomyopathy     mild nonischemic  . Allergic rhinitis     Past Surgical History  Procedure Date  . Dilation and  curettage of uterus   . Vaginal hysterectomy     History  Substance Use Topics  . Smoking status: Former Research scientist (life sciences)  . Smokeless tobacco: Never Used  . Alcohol Use: No    Family History  Problem Relation Age of Onset  . Hypertension Father     Allergies  Allergen Reactions  . Glimepiride     REACTION: hypoglycemia  . Guanfacine Hcl     REACTION: unspecified  . Rosiglitazone Maleate     REACTION: unspecified    Medication list has been reviewed and updated.  Outpatient Prescriptions Prior to Visit  Medication Sig Dispense Refill  . aspirin 81 MG tablet Take 81 mg by mouth daily. Five days a week      . BYDUREON 2 MG SUSR Inject 2 mg into the muscle once a week.       . carvedilol (COREG) 25 MG tablet Take 1 tablet (25 mg total) by mouth 2 (two) times daily.  60 tablet  11  . Coenzyme Q10 (COQ10) 50 MG CAPS Take 1 capsule by mouth daily.       . colchicine 0.6 MG tablet Take 1 tablet (0.6 mg total) by mouth daily.  30 tablet  5  . FLUoxetine (PROZAC) 20 MG capsule TAKE ONE CAPSULE DAILY  30 capsule  11  . insulin lispro (HUMALOG KWIKPEN) 100 UNIT/ML injection  6-10 Units.       . Insulin Pen Needle (NOVOFINE) 32G X 6 MM MISC Use as directed  100 each  3  . LANTUS SOLOSTAR 100 UNIT/ML injection Inject 43 Units into the skin at bedtime.       Marland Kitchen lisinopril (PRINIVIL,ZESTRIL) 40 MG tablet Take 1 tablet (40 mg total) by mouth daily.  30 tablet  6  . Probiotic Product (ALIGN) 4 MG CAPS Take by mouth.        . traMADol (ULTRAM) 50 MG tablet TAKE 1 TABLET EVERY 6 HOURS AS          NEEDED FOR PAIN  90 tablet  0    Review of Systems:  As above. No fevers, chills, shortness of breath.  Physical Examination: Filed Vitals:   07/03/12 1506  BP: 120/60  Pulse: 88  Temp: 98.5 F (36.9 C)   Filed Vitals:   07/03/12 1506  Weight: 142 lb (64.411 kg)   There is no height on file to calculate BMI. Ideal Body Weight:     GEN: WDWN, NAD, Non-toxic, A & O x 3 HEENT: Atraumatic,  Normocephalic. Neck supple. No masses, No LAD. Ears and Nose: No external deformity. CV: RRR, No M/G/R. No JVD. No thrill. No extra heart sounds. PULM: CTA B, no wheezes, crackles, rhonchi. No retractions. No resp. distress. No accessory muscle use. EXTR: No c/c/e NEURO Normal gait.  PSYCH: Normally interactive. Conversant. Not depressed or anxious appearing.  Calm demeanor.    Assessment and Plan:  1. Depression   2. DIABETES MELLITUS, TYPE II   3. HYPERLIPIDEMIA   4. CARDIOMYOPATHY, PRIMARY   5. LEFT BUNDLE BRANCH BLOCK   6. TRANSAMINASES, SERUM, ELEVATED    Overall doing well. Multiple medical problems, but stable. Managed by multiple specialists as well. Diabetes is  Somewhat unstable right now and she has close followup with her endocrinologist.  Cholesterol is reportedly close to goal. Will review those records.  She does have a history of some elevated liver enzymes regardless. Would be cautious with statin use.  Depression has been doing fairly well with being on her Prozac to 20 mg  Orders Today:  No orders of the defined types were placed in this encounter.    Updated Medication List: (Includes new medications, updates to list, dose adjustments) No orders of the defined types were placed in this encounter.    Medications Discontinued: There are no discontinued medications.   Owens Loffler, MD

## 2012-07-19 ENCOUNTER — Other Ambulatory Visit: Payer: Self-pay | Admitting: *Deleted

## 2012-07-19 MED ORDER — FLUOXETINE HCL 20 MG PO CAPS: 20.0000 mg | ORAL_CAPSULE | Freq: Every day | ORAL | Status: DC

## 2012-11-11 ENCOUNTER — Other Ambulatory Visit: Payer: Self-pay

## 2012-11-11 MED ORDER — LISINOPRIL 40 MG PO TABS: 40.0000 mg | ORAL_TABLET | Freq: Every day | ORAL | Status: DC

## 2012-11-11 NOTE — Telephone Encounter (Signed)
Refill sent for lisinopril 40 mg take one tablet daily.

## 2012-11-20 ENCOUNTER — Other Ambulatory Visit: Payer: Self-pay | Admitting: *Deleted

## 2012-11-20 MED ORDER — INSULIN PEN NEEDLE 32G X 6 MM MISC: Status: DC

## 2013-03-25 ENCOUNTER — Other Ambulatory Visit: Payer: Self-pay | Admitting: *Deleted

## 2013-03-25 MED ORDER — CARVEDILOL 25 MG PO TABS: 25.0000 mg | ORAL_TABLET | Freq: Two times a day (BID) | ORAL | Status: DC

## 2013-05-30 ENCOUNTER — Other Ambulatory Visit: Payer: Self-pay

## 2013-05-30 ENCOUNTER — Other Ambulatory Visit: Payer: Self-pay | Admitting: *Deleted

## 2013-05-30 MED ORDER — CARVEDILOL 25 MG PO TABS: 25.0000 mg | ORAL_TABLET | Freq: Two times a day (BID) | ORAL | Status: DC

## 2013-05-30 NOTE — Telephone Encounter (Signed)
Refill sent for carvedilol 25 mg take one tablet bid

## 2013-05-30 NOTE — Telephone Encounter (Signed)
LMOM TCB regarding an appointment in order to refill the carvedilol.

## 2013-06-17 ENCOUNTER — Ambulatory Visit (INDEPENDENT_AMBULATORY_CARE_PROVIDER_SITE_OTHER): Payer: BC Managed Care – PPO | Admitting: Cardiovascular Disease

## 2013-06-17 ENCOUNTER — Encounter: Payer: Self-pay | Admitting: Cardiovascular Disease

## 2013-06-17 VITALS — BP 132/82 | HR 103 | Ht 62.5 in | Wt 148.5 lb

## 2013-06-17 DIAGNOSIS — I428 Other cardiomyopathies: Secondary | ICD-10-CM

## 2013-06-17 DIAGNOSIS — E119 Type 2 diabetes mellitus without complications: Secondary | ICD-10-CM

## 2013-06-17 DIAGNOSIS — I10 Essential (primary) hypertension: Secondary | ICD-10-CM

## 2013-06-17 DIAGNOSIS — I447 Left bundle-branch block, unspecified: Secondary | ICD-10-CM

## 2013-06-17 DIAGNOSIS — E785 Hyperlipidemia, unspecified: Secondary | ICD-10-CM

## 2013-06-17 DIAGNOSIS — R079 Chest pain, unspecified: Secondary | ICD-10-CM

## 2013-06-17 MED ORDER — FENOFIBRATE 160 MG PO TABS: 160.0000 mg | ORAL_TABLET | Freq: Every day | ORAL | Status: DC

## 2013-06-17 MED ORDER — CARVEDILOL 25 MG PO TABS: 25.0000 mg | ORAL_TABLET | Freq: Two times a day (BID) | ORAL | Status: DC

## 2013-06-17 NOTE — Assessment & Plan Note (Addendum)
Followed by endocrine. We have encouraged continued exercise, careful diet management. Possible medication noncompliance as several of her medications are extensive. She is receiving insulin samples from endocrine clinic

## 2013-06-17 NOTE — Assessment & Plan Note (Addendum)
Most recent lipid panel not available. Suspect with elevated hemoglobin A1c, numbers will be higher. We will start her on fenofibrate for elevated triglycerides. History of myalgias on statins

## 2013-06-17 NOTE — Patient Instructions (Addendum)
You are doing well. Please start fenofibrate for triglycerides  If blood pressure is low, Either cut the lisinopril in 1/2 or cut the coreg in 1/2 twice a day  Please call us if you have new issues that need to be addressed before your next appt.  Your physician wants you to follow-up in: 12 months.  You will receive a reminder letter in the mail two months in advance. If you don't receive a letter, please call our office to schedule the follow-up appointment.

## 2013-06-17 NOTE — Assessment & Plan Note (Signed)
Encouraged her to stay on Coreg 12.5 425 mg twice a day with lisinopril 20 or 40 mg as blood pressure tolerates. Suggested she cut back on her doses slightly given recent hypotension and dizziness.

## 2013-06-17 NOTE — Progress Notes (Signed)
Patient ID: Kristy [name], female    DOB: September 23, 1951, 62 y.o.   MRN: [id number]  HPI Comments: 62 yo with HTN, diabetes, and mild nonischemic CM,  history of GI disease/IBS, high cholesterol, remote h/o smoking,  presents for followup. Previous cardiac catheterization in 2009 showed mild luminal irregularities. She does have underlying Crohn's disease.   Diabetes is being managed by Dr. Gabriel Carina at [facility]. Her hemoglobin A1c was in the 13 range, now down to 11 She is very active, walks up to 5 miles per day with no symptoms of chest pain or shortness of breath.  She's not sleep well, only for several hours. She reports that she is unable to tolerate statins. Triglycerides have been running high, greater than 300 TSH 1.2  She does report her blood pressure has been running very low with occasional dizziness   Last echo in 10/10 showed EF 50-55% with dysynnergy due to left bundle branch block.    ECG: normal sinus rhythm with left bundle branch block, rate 103 beats per minute    Outpatient Encounter Prescriptions as of 06/17/2013  Medication Sig Dispense Refill  . aspirin 81 MG tablet Take 81 mg by mouth daily. Five days a week      . BYDUREON 2 MG SUSR Inject 2 mg into the muscle once a week.       . carvedilol (COREG) 25 MG tablet Take 1 tablet (25 mg total) by mouth 2 (two) times daily. Please schedule an appointment with Dr for additional refills.  60 tablet  3  . Coenzyme Q10 (COQ10) 50 MG CAPS Take 1 capsule by mouth daily.       Marland Kitchen FLUoxetine (PROZAC) 20 MG capsule Take 1 capsule (20 mg total) by mouth daily.  30 capsule  11  . insulin detemir (LEVEMIR) 100 UNIT/ML injection Inject 35 Units into the skin every morning. Take 30 units every pm      . insulin lispro (HUMALOG KWIKPEN) 100 UNIT/ML injection Take 32-40-45 units      . Insulin Pen Needle (NOVOFINE) 32G X 6 MM MISC Use as directed  100 each  3  . lisinopril (PRINIVIL,ZESTRIL) 40 MG tablet Take 1 tablet (40 mg total) by  mouth daily.  30 tablet  6  . Probiotic Product (ALIGN) 4 MG CAPS Take by mouth.        . traMADol (ULTRAM) 50 MG tablet TAKE 1 TABLET EVERY 6 HOURS AS          NEEDED FOR PAIN  90 tablet  0  . [DISCONTINUED] LANTUS SOLOSTAR 100 UNIT/ML injection Inject 43 Units into the skin at bedtime.        No facility-administered encounter medications on file as of 06/17/2013.    Review of Systems  Constitutional: Negative.   HENT: Negative.   Eyes: Negative.   Respiratory: Negative.   Cardiovascular: Negative.   Gastrointestinal: Negative.   Endocrine: Negative.   Musculoskeletal: Negative.   Skin: Negative.   Allergic/Immunologic: Negative.   Neurological: Negative.   Hematological: Negative.   Psychiatric/Behavioral: Negative.   All other systems reviewed and are negative.    BP 132/82  Pulse 103  Ht 5' 2.5" (1.588 m)  Wt 148 lb 8 oz (67.359 kg)  BMI 26.71 kg/m2   Physical Exam  Nursing note and vitals reviewed. Constitutional: She is oriented to person, place, and time. She appears well-developed and well-nourished.  HENT:  Head: Normocephalic.  Nose: Nose normal.  Mouth/Throat: Oropharynx is clear and  moist.  Eyes: Conjunctivae are normal. Pupils are equal, round, and reactive to light.  Neck: Normal range of motion. Neck supple. No JVD present.  Cardiovascular: Regular rhythm, S1 normal, S2 normal, normal heart sounds and intact distal pulses.  Tachycardia present.  Exam reveals no gallop and no friction rub.   No murmur heard. Pulmonary/Chest: Effort normal and breath sounds normal. No respiratory distress. She has no wheezes. She has no rales. She exhibits no tenderness.  Abdominal: Soft. Bowel sounds are normal. She exhibits no distension. There is no tenderness.  Musculoskeletal: Normal range of motion. She exhibits no edema and no tenderness.  Lymphadenopathy:    She has no cervical adenopathy.  Neurological: She is alert and oriented to person, place, and time.  Coordination normal.  Skin: Skin is warm and dry. No rash noted. No erythema.  Psychiatric: She has a normal mood and affect. Her behavior is normal. Judgment and thought content normal.    Assessment and Plan

## 2013-06-17 NOTE — Assessment & Plan Note (Signed)
Encouraged her to stay on her carvedilol and lisinopril

## 2013-06-23 ENCOUNTER — Other Ambulatory Visit: Payer: Self-pay | Admitting: *Deleted

## 2013-06-23 MED ORDER — LISINOPRIL 40 MG PO TABS: 40.0000 mg | ORAL_TABLET | Freq: Every day | ORAL | Status: DC

## 2013-06-23 NOTE — Telephone Encounter (Signed)
Refilled Lisinopril sent to [facility].

## 2013-06-30 ENCOUNTER — Other Ambulatory Visit: Payer: Self-pay | Admitting: *Deleted

## 2013-06-30 MED ORDER — INSULIN PEN NEEDLE 32G X 6 MM MISC: Status: DC

## 2013-07-24 ENCOUNTER — Other Ambulatory Visit: Payer: Self-pay | Admitting: *Deleted

## 2013-07-24 NOTE — Telephone Encounter (Signed)
Last office visit 07/03/2012.  Ok to refill?

## 2013-07-25 MED ORDER — FLUOXETINE HCL 20 MG PO CAPS: 20.0000 mg | ORAL_CAPSULE | Freq: Every day | ORAL | Status: DC

## 2013-08-26 ENCOUNTER — Other Ambulatory Visit: Payer: Self-pay | Admitting: *Deleted

## 2013-08-26 MED ORDER — FLUOXETINE HCL 20 MG PO CAPS: 20.0000 mg | ORAL_CAPSULE | Freq: Every day | ORAL | Status: DC

## 2013-08-26 NOTE — Telephone Encounter (Signed)
Last office visit 07/03/2012.  Ok to refill?

## 2013-09-22 ENCOUNTER — Telehealth: Payer: Self-pay | Admitting: Internal Medicine

## 2013-09-22 NOTE — Telephone Encounter (Signed)
Pt sch 10/13/2013

## 2013-09-22 NOTE — Telephone Encounter (Signed)
Ok to f/u

## 2013-09-22 NOTE — Telephone Encounter (Signed)
Pt says she used to see you, but since she is seeing Dr. Lucilla Lame you told her she could continue care w/Dr. Gabriel Carina.  Pt needs a new Rx for prozac, but Dr. Gabriel Carina will not refill it. Pt wants to make an apptmt w/you for a Rx refill.  She says she needs to be seen sometime the first part of January. Thank you.

## 2013-10-13 ENCOUNTER — Ambulatory Visit (INDEPENDENT_AMBULATORY_CARE_PROVIDER_SITE_OTHER): Payer: BC Managed Care – PPO | Admitting: Family Medicine

## 2013-10-13 ENCOUNTER — Encounter: Payer: Self-pay | Admitting: Family Medicine

## 2013-10-13 VITALS — BP 110/70 | HR 96 | Temp 97.4°F | Ht 63.0 in | Wt 142.5 lb

## 2013-10-13 DIAGNOSIS — K5090 Crohn's disease, unspecified, without complications: Secondary | ICD-10-CM

## 2013-10-13 DIAGNOSIS — E1059 Type 1 diabetes mellitus with other circulatory complications: Secondary | ICD-10-CM

## 2013-10-13 DIAGNOSIS — I798 Other disorders of arteries, arterioles and capillaries in diseases classified elsewhere: Secondary | ICD-10-CM

## 2013-10-13 DIAGNOSIS — E119 Type 2 diabetes mellitus without complications: Secondary | ICD-10-CM

## 2013-10-13 DIAGNOSIS — F3342 Major depressive disorder, recurrent, in full remission: Secondary | ICD-10-CM

## 2013-10-13 DIAGNOSIS — Z794 Long term (current) use of insulin: Secondary | ICD-10-CM

## 2013-10-13 DIAGNOSIS — K50919 Crohn's disease, unspecified, with unspecified complications: Secondary | ICD-10-CM

## 2013-10-13 HISTORY — DX: Crohn's disease, unspecified, without complications: K50.90

## 2013-10-13 MED ORDER — FLUOXETINE HCL 20 MG PO CAPS: 20.0000 mg | ORAL_CAPSULE | Freq: Every day | ORAL | Status: DC

## 2013-10-13 NOTE — Progress Notes (Signed)
Pre-visit discussion using our clinic review tool. No additional management support is needed unless otherwise documented below in the visit note.  

## 2013-10-13 NOTE — Patient Instructions (Signed)
REFERRAL: GO THE THE FRONT ROOM AT THE ENTRANCE OF OUR CLINIC, NEAR CHECK IN. ASK FOR Kristy [name]. SHE WILL HELP YOU SET UP YOUR REFERRAL. DATE: TIME:  

## 2013-10-14 ENCOUNTER — Telehealth: Payer: Self-pay

## 2013-10-14 NOTE — Telephone Encounter (Signed)
Relevant patient education mailed to patient.  

## 2013-10-14 NOTE — Progress Notes (Signed)
Date:  10/13/2013   Name:  Kristy [name]   DOB:  1951/03/03   MRN:  [id number] Gender: female Age: 63 y.o.  Primary Physician:  Owens Loffler, MD   Chief Complaint: Medication Refill   Subjective:   History of Present Illness:  Kristy [name] is a 63 y.o. pleasant patient who presents with the following:  Very pleasant patient with cardiomyopathy and severe poorly controlled diabetes on a very high level of insulin who is here and routine followup. I have not seen her in 12-18 months.  She previously had been seen by Dr. Gabriel Carina to try to get her diabetes under better control, but this is led to a physician patient relationship that is probably not productive, and she would like to establish with a different endocrinologists as well.  She also needs a refill on her Prozac, and she has been stable on this, so I recommended to her that she maintain this.  Patient Active Problem List   Diagnosis Date Noted  . Crohn's disease 10/13/2013  . Major depressive disorder, recurrent, in full remission 05/01/2011  . TRANSAMINASES, SERUM, ELEVATED 05/01/2010  . PSORIASIS 08/02/2009  . HYPERLIPIDEMIA 05/11/2009  . IRRITABLE BOWEL SYNDROME 03/10/2009  . GOUT 02/05/2009  . CARDIOMYOPATHY, PRIMARY 01/11/2009  . Type 1 diabetes mellitus with vascular disease 04/21/2007  . HYPERTENSION 04/18/2007  . LEFT BUNDLE BRANCH BLOCK 04/18/2007  . ALLERGIC RHINITIS 04/18/2007  . GALLSTONES 04/18/2007  . OSTEOARTHRITIS 04/18/2007  . DIVERTICULITIS, HX OF 04/18/2007    Past Medical History  Diagnosis Date  . Diabetes mellitus   . Hypertension   . Hyperlipidemia   . Gout   . Osteoarthritis   . Symptomatic cholelithiasis   . Left bundle branch block   . IBS (irritable bowel syndrome)   . Cardiomyopathy     mild nonischemic  . Allergic rhinitis   . Crohn's disease 10/13/2013    Past Surgical History  Procedure Laterality Date  . Dilation and curettage of uterus    . Vaginal  hysterectomy      History   Social History  . Marital Status: Married    Spouse Name: N/A    Number of Children: N/A  . Years of Education: N/A   Occupational History  . Not on file.   Social History Main Topics  . Smoking status: Former Research scientist (life sciences)  . Smokeless tobacco: Never Used  . Alcohol Use: No  . Drug Use: No  . Sexual Activity: Not on file   Other Topics Concern  . Not on file   Social History Narrative  . No narrative on file    Family History  Problem Relation Age of Onset  . Hypertension Father     Allergies  Allergen Reactions  . Glimepiride     REACTION: hypoglycemia  . Guanfacine Hcl     REACTION: unspecified  . Rosiglitazone Maleate     REACTION: unspecified    Medication list has been reviewed and updated.  Review of Systems:   GEN: No acute illnesses, no fevers, chills. GI: No n/v/d, eating normally Pulm: No SOB Interactive and getting along well at home.  Otherwise, ROS is as per the HPI.  Objective:   Physical Examination: BP 110/70  Pulse 96  Temp(Src) 97.4 F (36.3 C) (Oral)  Ht 5' 3"  (1.6 m)  Wt 142 lb 8 oz (64.638 kg)  BMI 25.25 kg/m2  Ideal Body Weight: Weight in (lb) to have BMI = 25: 140.8    GEN:  WDWN, NAD, Non-toxic, A & O x 3 HEENT: Atraumatic, Normocephalic. Neck supple. No masses, No LAD. Ears and Nose: No external deformity. CV: RRR, No M/G/R. No JVD. No thrill. No extra heart sounds. PULM: CTA B, no wheezes, crackles, rhonchi. No retractions. No resp. distress. No accessory muscle use. EXTR: No c/c/e NEURO Normal gait.  PSYCH: Normally interactive. Conversant. Not depressed or anxious appearing.  Calm demeanor.    Laboratory and Imaging Data:  Assessment & Plan:    Major depressive disorder, recurrent, in full remission  Diabetes mellitus type 2, insulin dependent - Plan: Ambulatory referral to Endocrinology  Crohn's disease, unspecified complication  Type 1 diabetes mellitus with vascular  disease  Functionally getting along well, but BS remain 250-350 range despite high BS levels.  We are going to ask to see if Dr. Renne Crigler can see her.   Patient Instructions  REFERRAL: GO THE THE FRONT ROOM AT THE ENTRANCE OF OUR CLINIC, NEAR CHECK IN. ASK FOR [name]. SHE WILL HELP YOU SET UP YOUR REFERRAL. DATE: TIME:    Orders Placed This Encounter  Procedures  . Ambulatory referral to Endocrinology    New medications, updates to list, dose adjustments: Meds ordered this encounter  Medications  . FLUoxetine (PROZAC) 20 MG capsule    Sig: Take 1 capsule (20 mg total) by mouth daily.    Dispense:  30 capsule    Refill:  11    Signed,  Viviano Bir T. Mileydi Milsap, MD, [facility] at [facility] [place] [address] Phone: [phone] Fax: [phone]    Medication List       This list is accurate as of: 10/13/13 11:59 PM.  Always use your most recent med list.               ALIGN 4 MG Caps  Take by mouth.     carvedilol 25 MG tablet  Commonly known as:  COREG  Take 1 tablet (25 mg total) by mouth 2 (two) times daily.     FLUoxetine 20 MG capsule  Commonly known as:  PROZAC  Take 1 capsule (20 mg total) by mouth daily.     HUMALOG KWIKPEN 100 UNIT/ML injection  Generic drug:  insulin lispro  Take 32-40-45 units     insulin detemir 100 UNIT/ML injection  Commonly known as:  LEVEMIR  Inject 35 Units into the skin every morning. Take 30 units every pm     Insulin Pen Needle 32G X 6 MM Misc  Commonly known as:  NOVOFINE  Use as directed     lisinopril 40 MG tablet  Commonly known as:  PRINIVIL,ZESTRIL  Take 1 tablet (40 mg total) by mouth daily.     traMADol 50 MG tablet  Commonly known as:  ULTRAM  TAKE 1 TABLET EVERY 6 HOURS AS          NEEDED FOR PAIN         ,

## 2013-11-26 ENCOUNTER — Ambulatory Visit (INDEPENDENT_AMBULATORY_CARE_PROVIDER_SITE_OTHER): Payer: BC Managed Care – PPO | Admitting: Internal Medicine

## 2013-11-26 ENCOUNTER — Encounter: Payer: Self-pay | Admitting: Internal Medicine

## 2013-11-26 VITALS — BP 148/88 | HR 89 | Temp 98.1°F | Resp 12 | Ht 63.0 in | Wt 147.0 lb

## 2013-11-26 DIAGNOSIS — E1059 Type 1 diabetes mellitus with other circulatory complications: Secondary | ICD-10-CM

## 2013-11-26 DIAGNOSIS — I798 Other disorders of arteries, arterioles and capillaries in diseases classified elsewhere: Secondary | ICD-10-CM

## 2013-11-26 LAB — HEMOGLOBIN A1C: Hgb A1c MFr Bld: 14 % — ABNORMAL HIGH (ref 4.6–6.5)

## 2013-11-26 MED ORDER — METFORMIN HCL ER 500 MG PO TB24: 1000.0000 mg | ORAL_TABLET | Freq: Every day | ORAL | Status: DC

## 2013-11-26 MED ORDER — INSULIN DETEMIR 100 UNIT/ML ~~LOC~~ SOLN: 45.0000 [IU] | Freq: Two times a day (BID) | SUBCUTANEOUS | Status: DC

## 2013-11-26 NOTE — Progress Notes (Signed)
Patient ID: Kristy [name], female   DOB: 07-08-1951, 63 y.o.   MRN: [id number]  HPI: Kristy [name] is a 63 y.o.-year-old female, referred by her PCP, Dr. Lorelei Pont, for management of DM2, insulin-dependent, uncontrolled, with complications (Cardiomyopathy). She saw Dr Loanne Drilling 1.5 years ago. She then saw Dr Gabriel Carina, but would like to switch from [facility] to [facility].  Patient has been diagnosed with diabetes in 1998; she started insulin ~2005. Last hemoglobin A1c was: HbA1c: ~11% in 08/2013. Lab Results  Component Value Date   HGBA1C 8.5* 03/22/2010   HGBA1C 9.1* 11/15/2009   HGBA1C 8.7* 05/11/2009   Pt is on a regimen of: - Levemir 35 in am and 30 units qhs - Humalog 40-50-60 units tid ac - Humalog bedtime when sugars >300: 50 units She was on Metformin but taken off 2/2 IBS and Crohn's ds.  She was on Avandia x 2 years, developed heart ds. She was on Lantus but caused her SOB if taken >60 units at a time (not with lower doses!?). She was on Bydureon, which really helped despite being on mealtime insulin, but too expensive. Ronny Bacon is paying 400$ a mo for her insulins!  Pt checks her sugars 2x a day and they are: - am: 130-300 - 2h after b'fast: n/c - before lunch: n/c - 1h after lunch: 300s - before dinner: n/c - 2h after dinner: n/c - bedtime: 300s No lows. Lowest sugar was 130; she has hypoglycemia awareness ? level.  Highest sugar was 500s.  Pt's meals are: - Breakfast: 1 scrambled egg on toast - Lunch: 4 crackers, 1 slice cheese, 1/2 apple - Dinner: chicken + veggies - Snacks: 3: yoghurt, 1/2 apple, graham crackers  - Mild CKD, last BUN/creatinine:  Lab Results  Component Value Date   BUN 16 10/24/2010   CREATININE 1.2 10/24/2010  On Lisinopril. - last set of lipids: Lab Results  Component Value Date   CHOL 170 04/26/2009   HDL 33* 04/26/2009   LDLCALC 75 04/26/2009   LDLDIRECT 123.3 04/26/2010   TRIG 310* 04/26/2009   CHOLHDL 5.2 Ratio 04/26/2009   - last eye  exam was in 08/2013 - Dr Katy Fitch. No DR.  - no numbness and tingling in her feet.  I reviewed her chart and she also has a history of chronic yeast infections.   No FH of DM.  ROS: Constitutional: no weight gain/loss, + fatigue, + subjective hypothermia, + nocturia Eyes: + blurry vision, no xerophthalmia ENT: + sore throat, no nodules palpated in throat, no dysphagia/odynophagia, no hoarseness, + decreased hearing, + tinnitus Cardiovascular: + all: CP/palpitations/leg swelling, but no SOB Respiratory: no cough/SOB Gastrointestinal: no N/V/D/C Musculoskeletal: + muscle aches/no joint aches Skin: no rashes Neurological: no tremors/numbness/tingling/dizziness, + HA Psychiatric: no depression/anxiety Low libido Past Medical History  Diagnosis Date  . Diabetes mellitus   . Hypertension   . Hyperlipidemia   . Gout   . Osteoarthritis   . Symptomatic cholelithiasis   . Left bundle branch block   . IBS (irritable bowel syndrome)   . Cardiomyopathy     mild nonischemic  . Allergic rhinitis   . Crohn's disease 10/13/2013   Past Surgical History  Procedure Laterality Date  . Dilation and curettage of uterus    . Vaginal hysterectomy     History   Social History  . Marital Status: Married    Spouse Name: N/A    Number of Children: 0   Occupational History  . [employer]ings and [employer]  Social History Main Topics  . Smoking status: Former Smoker, quit 1998  . Smokeless tobacco: Never Used  . Alcohol Use: No  . Drug Use: No   Current Outpatient Prescriptions on File Prior to Visit  Medication Sig Dispense Refill  . carvedilol (COREG) 25 MG tablet Take 1 tablet (25 mg total) by mouth 2 (two) times daily.  60 tablet  11  . FLUoxetine (PROZAC) 20 MG capsule Take 1 capsule (20 mg total) by mouth daily.  30 capsule  11  . insulin lispro (HUMALOG KWIKPEN) 100 UNIT/ML injection Take 32-40-45 units      . Insulin Pen Needle (NOVOFINE) 32G X 6 MM MISC Use as directed  100 each  6  .  lisinopril (PRINIVIL,ZESTRIL) 40 MG tablet Take 1 tablet (40 mg total) by mouth daily.  30 tablet  6  . Probiotic Product (ALIGN) 4 MG CAPS Take by mouth.        . traMADol (ULTRAM) 50 MG tablet TAKE 1 TABLET EVERY 6 HOURS AS          NEEDED FOR PAIN  90 tablet  0   No current facility-administered medications on file prior to visit.   Allergies  Allergen Reactions  . Glimepiride     REACTION: hypoglycemia  . Guanfacine Hcl     REACTION: unspecified  . Rosiglitazone Maleate     REACTION: unspecified   Family History  Problem Relation Age of Onset  . Hypertension Father    PE: BP 148/88  Pulse 89  Temp(Src) 98.1 F (36.7 C) (Oral)  Resp 12  Ht 5' 3"  (1.6 m)  Wt 147 lb (66.679 kg)  BMI 26.05 kg/m2  SpO2 98% Wt Readings from Last 3 Encounters:  11/26/13 147 lb (66.679 kg)  10/13/13 142 lb 8 oz (64.638 kg)  06/17/13 148 lb 8 oz (67.359 kg)   Constitutional: normal weight, in NAD Eyes: PERRLA, EOMI, no exophthalmos ENT: moist mucous membranes, no thyromegaly, no cervical lymphadenopathy Cardiovascular: RRR, No MRG Respiratory: CTA B Gastrointestinal: abdomen soft, NT, ND, BS+ Musculoskeletal: no deformities, strength intact in all 4 Skin: moist, warm, no rashes Neurological: no tremor with outstretched hands, DTR normal in all 4  ASSESSMENT: 1. DM2, insulin-dependent, uncontrolled, with complications - CMP, with CHF Cardiac catheterization in 12/2008: no CAD, EF 45%, +inferior wall hypokinesis. 2D Echo in 11/2008: EF 40%, + inferior wall hypokinesis  PLAN:  1. Patient with long-standing, uncontrolled diabetes, with high insulin resistance. I am not sure why she is so insulin resistant, in the absence of an uncontrolled diet and w/o being very overweight or sedentary (walks 5 miles a day)... ? oversecretion of catecholamines in the setting of her CHF? (She is on Coreg, though). - We discussed about options for treatment, and I suggested to:  Patient Instructions  Please  increase Levemir to 45 in am and 45 units at night. Please stay on the same doses of Humalog: 40-50-60 units before b'fast, lunch and dinner.  Please start Metformin XR 500 mg with dinner x 5 days. If you tolerate this well, add another Metformin tablet (500 mg) with breakfast x 5 days. If you tolerate this well, add another metformin tablet with dinner (total 1000 mg) x 5 days. If you tolerate this well, add another metformin tablets with breakfast (total 1000 mg). Continue with 1000 mg of metformin twice a day with breakfast and dinner.  Please stop at the lab.  Please return in 1 month with your sugar log.   -  Strongly advised her to start checking sugars at different times of the day - check 3 times a day, rotating checks - given sugar log and advised how to fill it and to bring it at next appt  - given foot care handout and explained the principles  - given instructions for hypoglycemia management "15-15 rule"  - advised for yearly eye exams - she is up to date - Return to clinic in 1 mo with sugar log   Office Visit on 11/26/2013  Component Date Value Ref Range Status  . Hemoglobin A1C 11/26/2013 14.0* 4.6 - 6.5 % Final   Glycemic Control Guidelines for People with Diabetes:Non Diabetic:  <6%Goal of Therapy: <7%Additional Action Suggested:  >8%    We will likely need to start U500 insulin - will see how she responds to Metformin.

## 2013-11-26 NOTE — Patient Instructions (Addendum)
Please increase Levemir to 45 in am and 45 units at night. Please stay on the same doses of Humalog: 40-50-60 units before b'fast, lunch and dinner.  Please start Metformin XR 500 mg with dinner x 5 days. If you tolerate this well, add another Metformin tablet (500 mg) with breakfast x 5 days. If you tolerate this well, add another metformin tablet with dinner (total 1000 mg) x 5 days. If you tolerate this well, add another metformin tablets with breakfast (total 1000 mg). Continue with 1000 mg of metformin twice a day with breakfast and dinner.  Please stop at the lab.  Please return in 1 month with your sugar log.   PATIENT INSTRUCTIONS FOR TYPE 2 DIABETES:  **Please join MyChart!** - see attached instructions about how to join   DIET AND EXERCISE Diet and exercise is an important part of diabetic treatment.  We recommended aerobic exercise in the form of brisk walking (working between 40-60% of maximal aerobic capacity, similar to brisk walking) for 150 minutes per week (such as 30 minutes five days per week) along with 3 times per week performing 'resistance' training (using various gauge rubber tubes with handles) 5-10 exercises involving the major muscle groups (upper body, lower body and core) performing 10-15 repetitions (or near fatigue) each exercise. Start at half the above goal but build slowly to reach the above goals. If limited by weight, joint pain, or disability, we recommend daily walking in a swimming pool with water up to waist to reduce pressure from joints while allow for adequate exercise.    BLOOD GLUCOSES Monitoring your blood glucoses is important for continued management of your diabetes. Please check your blood glucoses 2-4 times a day: fasting, before meals and at bedtime (you can rotate these measurements - e.g. one day check before the 3 meals, the next day check before 2 of the meals and before bedtime, etc.   HYPOGLYCEMIA (low blood sugar) Hypoglycemia is usually  a reaction to not eating, exercising, or taking too much insulin/ other diabetes drugs.  Symptoms include tremors, sweating, hunger, confusion, headache, etc. Treat IMMEDIATELY with 15 grams of Carbs:   4 glucose tablets    cup regular juice/soda   2 tablespoons raisins   4 teaspoons sugar   1 tablespoon honey Recheck blood glucose in 15 mins and repeat above if still symptomatic/blood glucose <100. Please contact our office at [phone] if you have questions about how to next handle your insulin.  RECOMMENDATIONS TO REDUCE YOUR RISK OF DIABETIC COMPLICATIONS: * Take your prescribed MEDICATION(S). * Follow a DIABETIC diet: Complex carbs, fiber rich foods, heart healthy fish twice weekly, (monounsaturated and polyunsaturated) fats * AVOID saturated/trans fats, high fat foods, >2,300 mg salt per day. * EXERCISE at least 5 times a week for 30 minutes or preferably daily.  * DO NOT SMOKE OR DRINK more than 1 drink a day. * Check your FEET every day. Do not wear tightfitting shoes. Contact us if you develop an ulcer * See your EYE doctor once a year or more if needed * Get a FLU shot once a year * Get a PNEUMONIA vaccine once before and once after age 67 years  GOALS:  * Your Hemoglobin A1c of <7%  * fasting sugars need to be <130 * after meals sugars need to be <180 (2h after you start eating) * Your Systolic BP should be 169 or lower  * Your Diastolic BP should be 80 or lower  * Your HDL (Good  Cholesterol) should be 40 or higher  * Your LDL (Bad Cholesterol) should be 100 or lower  * Your Triglycerides should be 150 or lower  * Your Urine microalbumin (kidney function) should be <30 * Your Body Mass Index should be 25 or lower   We will be glad to help you achieve these goals. Our telephone number is: [phone].

## 2013-12-23 ENCOUNTER — Encounter: Payer: Self-pay | Admitting: Internal Medicine

## 2013-12-23 ENCOUNTER — Ambulatory Visit (INDEPENDENT_AMBULATORY_CARE_PROVIDER_SITE_OTHER): Payer: BC Managed Care – PPO | Admitting: Internal Medicine

## 2013-12-23 VITALS — BP 134/62 | HR 90 | Temp 98.9°F | Resp 12 | Wt 149.0 lb

## 2013-12-23 DIAGNOSIS — E1159 Type 2 diabetes mellitus with other circulatory complications: Secondary | ICD-10-CM

## 2013-12-23 DIAGNOSIS — I798 Other disorders of arteries, arterioles and capillaries in diseases classified elsewhere: Secondary | ICD-10-CM

## 2013-12-23 NOTE — Progress Notes (Signed)
Patient ID: Kristy [name], female   DOB: [birth year], 63 y.o.   MRN: [id number]  HPI: Kristy [name] is a 63 y.o.-year-old female, returning for f/u for DM2, dx 1998, insulin-dependent since 2005, uncontrolled, with complications (Cardiomyopathy). First visit 1 mo ago.   Last hemoglobin A1c was: HbA1c: ~11% in 08/2013. Lab Results  Component Value Date   HGBA1C 14.0* 11/26/2013   HGBA1C 8.5* 03/22/2010   HGBA1C 9.1* 11/15/2009   Pt is on a regimen of: - Levemir 35 >> 45 in am and 30 >> 45 units qhs - Humalog 40-50-60 units tid ac - Humalog bedtime when sugars >300: 50 units >> stopped - Metformin XR added at last visit. She was on Metformin but taken off 2/2 IBS and Crohn's ds.  She was on Avandia x 2 years, developed heart ds. She was on Lantus but caused her SOB if taken >60 units at a time (not with lower doses!?). She was on Bydureon, which really helped despite being on mealtime insulin, but too expensive. Ronny Bacon is paying 400$ a mo for her insulins!  Pt checks her sugars 2-3x a day and they are: - am: 130-300 >> 142-231 - 2h after b'fast: n/c  - before lunch: n/c >> 88-180 - 1h after lunch: 300s >> 125, 351, 352 - before dinner: n/c >> 132-253 - she may not take lunchtime insulin if she eats a smaller meal or if at goal before meal  - 2h after dinner: n/c >> 107, 310, 332, 367 - bedtime: 300s >> 169, 178 No lows. Lowest sugar was 130; she has hypoglycemia awareness ? level.  Highest sugar was 500s.  Pt's meals are: - Breakfast: 1 scrambled egg on toast - Lunch: 4 crackers, 1 slice cheese, 1/2 apple - Dinner: chicken + veggies - Snacks: 3: yoghurt, 1/2 apple, graham crackers  - Mild CKD, last BUN/creatinine:  Lab Results  Component Value Date   BUN 16 10/24/2010   CREATININE 1.2 10/24/2010  On Lisinopril. - last set of lipids: Lab Results  Component Value Date   CHOL 170 04/26/2009   HDL 33* 04/26/2009   LDLCALC 75 04/26/2009   LDLDIRECT 123.3 04/26/2010   TRIG 310*  04/26/2009   CHOLHDL 5.2 Ratio 04/26/2009   - last eye exam was in 08/2013 - Dr Katy Fitch. No DR.  - no numbness and tingling in her feet.  She has a history of chronic yeast infections.   I reviewed pt's medications, allergies, PMH, social hx, family hx and no changes required, except as mentioned above.  ROS: Constitutional: no weight gain/loss, no fatigue, no subjective hypothermia, + nocturia Eyes: + blurry vision, no xerophthalmia ENT: + sore throat, no nodules palpated in throat, no dysphagia/odynophagia, no hoarseness, + decreased hearing, + tinnitus Cardiovascular: no CP/palpitations/+ hand swelling, no SOB Respiratory: no cough/SOB Gastrointestinal: no N/V/+D/noC Musculoskeletal: + muscle aches/no joint aches Skin: no rashes Neurological: no tremors/numbness/tingling/dizziness, + HA Low libido  PE: BP 134/62  Pulse 90  Temp(Src) 98.9 F (37.2 C) (Oral)  Resp 12  Wt 149 lb (67.586 kg)  SpO2 95% Wt Readings from Last 3 Encounters:  12/23/13 149 lb (67.586 kg)  11/26/13 147 lb (66.679 kg)  10/13/13 142 lb 8 oz (64.638 kg)   Constitutional: normal weight, in NAD Eyes: PERRLA, EOMI, no exophthalmos ENT: moist mucous membranes, no thyromegaly, no cervical lymphadenopathy Cardiovascular: RRR, No MRG, + slight swelling hands Respiratory: CTA B Gastrointestinal: abdomen soft, NT, ND, BS+ Musculoskeletal: no deformities, strength intact in all 4  Skin: moist, warm, no rashes Neurological: no tremor with outstretched hands, DTR normal in all 4  ASSESSMENT: 1. DM2, insulin-dependent, uncontrolled, with complications - High insulin resistance - CMP, with CHF Cardiac catheterization in 12/2008: no CAD, EF 45%, +inferior wall hypokinesis. 2D Echo in 11/2008: EF 40%, + inferior wall hypokinesis  PLAN:  1. Patient with long-standing, uncontrolled diabetes, with high insulin resistance. Now with improved control after starting Metformin. - We discussed about options for treatment,  and I suggested to:  Patient Instructions  - continue Levemir 45 units  in am 45 units at night - continue Humalog 40-50-60 units with B-L-D - take 10 units less for a smaller meal - continue Metformin XR 1000 mg 2x a day. - no U500 needed quite now but she is over 200 units insulin daily; improving, though - continue checking sugars at different times of the day - check 3 times a day, rotating checks - she is up to date with yearly eye exams - Return to clinic in 1.5 mo with sugar log

## 2013-12-23 NOTE — Patient Instructions (Signed)
-   continue Levemir 45 units  in am 45 units at night - continue Humalog 40-50-60 units with B-L-D - take 10 units less for a smaller meal - continue Metformin XR 1000 mg 2x a day.

## 2014-02-03 ENCOUNTER — Ambulatory Visit: Payer: BC Managed Care – PPO | Admitting: Internal Medicine

## 2014-02-03 ENCOUNTER — Encounter: Payer: Self-pay | Admitting: Family Medicine

## 2014-02-03 ENCOUNTER — Ambulatory Visit (INDEPENDENT_AMBULATORY_CARE_PROVIDER_SITE_OTHER): Payer: BC Managed Care – PPO | Admitting: Family Medicine

## 2014-02-03 ENCOUNTER — Ambulatory Visit (INDEPENDENT_AMBULATORY_CARE_PROVIDER_SITE_OTHER)
Admission: RE | Admit: 2014-02-03 | Discharge: 2014-02-03 | Disposition: A | Discharge status: Home / Self Care | Payer: BC Managed Care – PPO | Source: Ambulatory Visit | Attending: Family Medicine | Admitting: Family Medicine

## 2014-02-03 VITALS — BP 163/92 | HR 85 | Temp 98.2°F | Ht 63.0 in | Wt 149.8 lb

## 2014-02-03 DIAGNOSIS — R05 Cough: Secondary | ICD-10-CM | POA: Insufficient documentation

## 2014-02-03 DIAGNOSIS — R059 Cough, unspecified: Secondary | ICD-10-CM

## 2014-02-03 MED ORDER — DOXYCYCLINE HYCLATE 100 MG PO TABS: 100.0000 mg | ORAL_TABLET | Freq: Two times a day (BID) | ORAL | Status: DC

## 2014-02-03 NOTE — Assessment & Plan Note (Signed)
No focal pneumonia seen. Will send X-ray for radiology overread. Given length of symptoms will treat for atypical ONA with doxy x 10 days. Follow up if not improving as expected.

## 2014-02-03 NOTE — Progress Notes (Signed)
Pre visit review using our clinic review tool, if applicable. No additional management support is needed unless otherwise documented below in the visit note. 

## 2014-02-03 NOTE — Progress Notes (Signed)
   Subjective:    Patient ID: Kristy [name], female    DOB: Nov 18, 1950, 63 y.o.   MRN: [id number]  Cough This is a new problem. The current episode started 1 to 4 weeks ago (13 days). The problem has been gradually worsening. The problem occurs constantly. The cough is productive of sputum. Associated symptoms include ear congestion and shortness of breath. Pertinent negatives include no ear pain, fever, headaches, heartburn, hemoptysis, myalgias, postnasal drip, sore throat, sweats or wheezing. Associated symptoms comments: At night when lying she noted noise in her chest. Musical sound  sinus pressure off and on  no current fever but did have some initially. The symptoms are aggravated by lying down. Treatments tried: mucinex,  has been taking a decongestant. The treatment provided mild relief. There is no history of asthma, bronchitis, COPD, emphysema, environmental allergies or pneumonia. she has very poorly controlled diabetes, nonsmoker   BP Readings from Last 3 Encounters:  02/03/14 163/92  12/23/13 134/62  11/26/13 148/88      Review of Systems  Constitutional: Negative for fever.  HENT: Negative for ear pain, postnasal drip and sore throat.   Respiratory: Positive for cough and shortness of breath. Negative for hemoptysis and wheezing.   Gastrointestinal: Negative for heartburn.  Musculoskeletal: Negative for myalgias.  Allergic/Immunologic: Negative for environmental allergies.  Neurological: Negative for headaches.       Objective:   Physical Exam  Constitutional: Vital signs are normal. She appears well-developed and well-nourished. She is cooperative.  Non-toxic appearance. She does not appear ill. No distress.  HENT:  Head: Normocephalic.  Right Ear: Hearing, tympanic membrane, external ear and ear canal normal. Tympanic membrane is not erythematous, not retracted and not bulging.  Left Ear: Hearing, tympanic membrane, external ear and ear canal normal. Tympanic  membrane is not erythematous, not retracted and not bulging.  Nose: Mucosal edema and rhinorrhea present. Right sinus exhibits no maxillary sinus tenderness and no frontal sinus tenderness. Left sinus exhibits no maxillary sinus tenderness and no frontal sinus tenderness.  Mouth/Throat: Uvula is midline, oropharynx is clear and moist and mucous membranes are normal.  Eyes: Conjunctivae, EOM and lids are normal. Pupils are equal, round, and reactive to light. Lids are everted and swept, no foreign bodies found.  Neck: Trachea normal and normal range of motion. Neck supple. Carotid bruit is not present. No mass and no thyromegaly present.  Cardiovascular: Normal rate, regular rhythm, S1 normal, S2 normal, normal heart sounds, intact distal pulses and normal pulses.  Exam reveals no gallop and no friction rub.   No murmur heard. Pulmonary/Chest: Effort normal. Not tachypneic. No respiratory distress. She has no decreased breath sounds. She has no wheezes. She has no rhonchi. She has rales in the right lower field and the left lower field.  Neurological: She is alert.  Skin: Skin is warm, dry and intact. No rash noted.  Psychiatric: Her speech is normal and behavior is normal. Judgment normal. Her mood appears not anxious. Cognition and memory are normal. She does not exhibit a depressed mood.          Assessment & Plan:

## 2014-02-03 NOTE — Patient Instructions (Addendum)
Stop decongestant. Call if BP remains > 130/80 for more than three measurements, call PCP.  Start and complete antibiotics x 10 days. Call if shortness of breath or fever on antibiotics.

## 2014-02-10 ENCOUNTER — Ambulatory Visit (INDEPENDENT_AMBULATORY_CARE_PROVIDER_SITE_OTHER): Payer: BC Managed Care – PPO | Admitting: Internal Medicine

## 2014-02-10 ENCOUNTER — Encounter: Payer: Self-pay | Admitting: Internal Medicine

## 2014-02-10 VITALS — BP 120/72 | HR 105 | Temp 98.6°F | Resp 12 | Wt 149.8 lb

## 2014-02-10 DIAGNOSIS — E1159 Type 2 diabetes mellitus with other circulatory complications: Secondary | ICD-10-CM

## 2014-02-10 DIAGNOSIS — I798 Other disorders of arteries, arterioles and capillaries in diseases classified elsewhere: Secondary | ICD-10-CM

## 2014-02-10 NOTE — Progress Notes (Signed)
Patient ID: Kristy [name], female   DOB: 1951/04/16, 63 y.o.   MRN: [id number]  HPI: Kristy [name] is a 63 y.o.-year-old female, returning for f/u for DM2, dx 1998, insulin-dependent since 2005, uncontrolled, with complications (Cardiomyopathy). First visit 1.5 mo ago.   She developed PNA >> started on ABx >> stomach upset >> sugars higher.  Last hemoglobin A1c was: HbA1c: ~11% in 08/2013. Lab Results  Component Value Date   HGBA1C 14.0* 11/26/2013   HGBA1C 8.5* 03/22/2010   HGBA1C 9.1* 11/15/2009   Pt is on a regimen of: - Levemir 35 >> 45 in am and 30 >> 45 units qhs - Humalog 40-50-60 units tid ac - Humalog bedtime when sugars >300: 50 units >> stopped - Metformin XR added at last visit. She was on Metformin but taken off 2/2 IBS and Crohn's ds.  She was on Avandia x 2 years, developed heart ds. She was on Lantus but caused her SOB if taken >60 units at a time (not with lower doses!?). She was on Bydureon, which really helped despite being on mealtime insulin, but too expensive. Ronny Bacon is paying 400$ a mo for her insulins!  Pt checks her sugars 2-3x a day and they are: - am: 130-300 >> 142-231 >> 155-240 - 2h after b'fast: n/c >> 311-365 - before lunch: n/c >> 88-180 >> 77-210 - 1h after lunch: 300s >> 125, 351, 352 >> 174-253 (405) - before dinner: n/c >> 132-253 - she may not take lunchtime insulin if she eats a smaller meal or if at goal before meal  >> 69-220 - 2h after dinner: n/c >> 107, 310, 332, 367 >> 199-386 - bedtime: 300s >> 169, 178 >> n/c Had 2 lows lows. Lowest sugar was 69; she has hypoglycemia awareness ? level.  Highest sugar was 405.  Pt's meals are: - Breakfast: 1 scrambled egg on toast - Lunch: 4 crackers, 1 slice cheese, 1/2 apple - Dinner: chicken + veggies - Snacks: 3: yoghurt, 1/2 apple, graham crackers  - Mild CKD, last BUN/creatinine:  Lab Results  Component Value Date   BUN 16 10/24/2010   CREATININE 1.2 10/24/2010  On Lisinopril. - last  set of lipids: Lab Results  Component Value Date   CHOL 170 04/26/2009   HDL 33* 04/26/2009   LDLCALC 75 04/26/2009   LDLDIRECT 123.3 04/26/2010   TRIG 310* 04/26/2009   CHOLHDL 5.2 Ratio 04/26/2009   - last eye exam was in 08/2013 - Dr Katy Fitch. No DR.  - no numbness and tingling in her feet.  She has a history of chronic yeast infections.   I reviewed pt's medications, allergies, PMH, social hx, family hx and no changes required, except as mentioned above.  ROS: Constitutional: + weight gain, + fatigue, + subjective hyperthermia Eyes: + blurry vision, no xerophthalmia ENT: + sore throat, no nodules palpated in throat, no dysphagia/odynophagia, no hoarseness, + decreased hearing, + tinnitus Cardiovascular: no CP/palpitations/+ hand swelling, no SOB Respiratory: +cough/+SOB/+ wheezing Gastrointestinal: no N/V/D/C Musculoskeletal: + muscle aches/no joint aches Skin: no rashes Neurological: no tremors/numbness/tingling/dizziness  PE: BP 120/72  Pulse 105  Temp(Src) 98.6 F (37 C) (Oral)  Resp 12  Wt 149 lb 12.8 oz (67.949 kg)  SpO2 95% Wt Readings from Last 3 Encounters:  02/10/14 149 lb 12.8 oz (67.949 kg)  02/03/14 149 lb 12 oz (67.926 kg)  12/23/13 149 lb (67.586 kg)   Constitutional: normal weight, in NAD Eyes: PERRLA, EOMI, no exophthalmos ENT: moist mucous membranes, no thyromegaly,  no cervical lymphadenopathy Cardiovascular: RRR, No MRG Respiratory: CTA B Gastrointestinal: abdomen soft, NT, ND, BS+ Musculoskeletal: no deformities, strength intact in all 4 Skin: moist, warm, no rashes Neurological: no tremor with outstretched hands, DTR normal in all 4  ASSESSMENT: 1. DM2, insulin-dependent, uncontrolled, with complications - High insulin resistance - CMP, with CHF Cardiac catheterization in 12/2008: no CAD, EF 45%, +inferior wall hypokinesis. 2D Echo in 11/2008: EF 40%, + inferior wall hypokinesis  PLAN:  1. Patient with long-standing, uncontrolled diabetes, with  high insulin resistance. Now with improved control after starting Metformin, but higher sugars now with PNA. They started to improve, but still high after dinner. - I suggested to:  Patient Instructions  - Levemir 45 units 2x a day - Humalog 40-50-67 units with B-L-D - Increase Metformin XR to 1000 mg 2x a day Please return in 1.5 month with your sugar log.  - we can try U500 soon - continue checking sugars at different times of the day - check 3 times a day, rotating checks - she is up to date with yearly eye exams - will check A1c at next visit - Return to clinic in 1.5 mo with sugar log

## 2014-02-10 NOTE — Patient Instructions (Signed)
-   Levemir 45 units 2x a day - Humalog 40-50-67 units with B-L-D - Increase Metformin XR to 1000 mg 2x a day  Please return in 1.5 month with your sugar log.

## 2014-02-18 ENCOUNTER — Other Ambulatory Visit: Payer: Self-pay | Admitting: Family Medicine

## 2014-02-18 ENCOUNTER — Other Ambulatory Visit: Payer: Self-pay | Admitting: Cardiovascular Disease

## 2014-02-25 ENCOUNTER — Encounter: Payer: Self-pay | Admitting: Family Medicine

## 2014-02-25 ENCOUNTER — Ambulatory Visit (INDEPENDENT_AMBULATORY_CARE_PROVIDER_SITE_OTHER): Payer: BC Managed Care – PPO | Admitting: Family Medicine

## 2014-02-25 ENCOUNTER — Ambulatory Visit (INDEPENDENT_AMBULATORY_CARE_PROVIDER_SITE_OTHER)
Admission: RE | Admit: 2014-02-25 | Discharge: 2014-02-25 | Disposition: A | Discharge status: Home / Self Care | Payer: BC Managed Care – PPO | Source: Ambulatory Visit | Attending: Family Medicine | Admitting: Family Medicine

## 2014-02-25 VITALS — BP 120/66 | HR 93 | Temp 98.9°F | Ht 63.0 in | Wt 150.2 lb

## 2014-02-25 DIAGNOSIS — R9389 Abnormal findings on diagnostic imaging of other specified body structures: Secondary | ICD-10-CM

## 2014-02-25 DIAGNOSIS — R059 Cough, unspecified: Secondary | ICD-10-CM

## 2014-02-25 DIAGNOSIS — R05 Cough: Secondary | ICD-10-CM

## 2014-02-25 DIAGNOSIS — R918 Other nonspecific abnormal finding of lung field: Secondary | ICD-10-CM

## 2014-02-25 NOTE — Progress Notes (Signed)
Pre visit review using our clinic review tool, if applicable. No additional management support is needed unless otherwise documented below in the visit note. 

## 2014-02-25 NOTE — Progress Notes (Signed)
[place] [address] Phone: [phone] Fax: [phone]  Patient ID: Kristy [name] MRN: [id number], DOB: 01-01-1951, 63 y.o. Date of Encounter: 02/25/2014  Primary Physician:  Owens Loffler, MD   Chief Complaint: Follow-up  Subjective:   History of Present Illness:  Kristy [name] is a 63 y.o. pleasant patient who presents with the following:  Patient was sick for approximately one month, and she followed up in our office where she had an abnormal chest x-ray with a right upper lobe density. Given the timing around her acute illness, followup chest x-ray was recommended. The patient does have an approximate 30 year pack year smoking history.  She was treated with a ten-day course of doxycycline.  She also has a history of cardiomyopathy, poorly controlled type 2 diabetes, Crohn's disease, hyperlipidemia.  Patient Active Problem List   Diagnosis Date Noted  . Crohn's disease 10/13/2013  . Major depressive disorder, recurrent, in full remission 05/01/2011  . TRANSAMINASES, SERUM, ELEVATED 05/01/2010  . PSORIASIS 08/02/2009  . HYPERLIPIDEMIA 05/11/2009  . IRRITABLE BOWEL SYNDROME 03/10/2009  . GOUT 02/05/2009  . CARDIOMYOPATHY, PRIMARY 01/11/2009  . Type 2 diabetes mellitus with vascular disease   . HYPERTENSION 04/18/2007  . LEFT BUNDLE BRANCH BLOCK 04/18/2007  . ALLERGIC RHINITIS 04/18/2007  . GALLSTONES 04/18/2007  . OSTEOARTHRITIS 04/18/2007  . DIVERTICULITIS, HX OF 04/18/2007   Past Medical History  Diagnosis Date  . Diabetes mellitus   . Hypertension   . Hyperlipidemia   . Gout   . Osteoarthritis   . Symptomatic cholelithiasis   . Left bundle branch block   . IBS (irritable bowel syndrome)   . Cardiomyopathy     mild nonischemic  . Allergic rhinitis   . Crohn's disease 10/13/2013   Past Surgical History  Procedure Laterality Date  . Dilation and curettage of uterus    . Vaginal hysterectomy     History   Social History  .  Marital Status: Married    Spouse Name: N/A    Number of Children: N/A  . Years of Education: N/A   Occupational History  . Not on file.   Social History Main Topics  . Smoking status: Former Research scientist (life sciences)  . Smokeless tobacco: Never Used  . Alcohol Use: No  . Drug Use: No  . Sexual Activity: Not on file   Other Topics Concern  . Not on file   Social History Narrative  . No narrative on file   Family History  Problem Relation Age of Onset  . Hypertension Father    Allergies  Allergen Reactions  . Glimepiride     REACTION: hypoglycemia  . Guanfacine Hcl     REACTION: unspecified  . Rosiglitazone Maleate     REACTION: unspecified   Medication list has been reviewed and updated.  Review of Systems:  GEN: No acute illnesses, no fevers, chills. GI: No n/v/d, eating normally Pulm: No SOB Interactive and getting along well at home.  Otherwise, ROS is as per the HPI.  Objective:   Physical Examination: BP 120/66  Pulse 93  Temp(Src) 98.9 F (37.2 C) (Oral)  Ht 5' 3"  (1.6 m)  Wt 150 lb 4 oz (68.153 kg)  BMI 26.62 kg/m2  SpO2 94%   GEN: WDWN, NAD, Non-toxic, A & O x 3 HEENT: Atraumatic, Normocephalic. Neck supple. No masses, No LAD. Ears and Nose: No external deformity. CV: RRR, No M/G/R. No JVD. No thrill. No extra heart sounds. PULM: CTA B, no wheezes,  crackles, rhonchi. No retractions. No resp. distress. No accessory muscle use. EXTR: No c/c/e NEURO Normal gait.  PSYCH: Normally interactive. Conversant. Not depressed or anxious appearing.  Calm demeanor.   Laboratory and Imaging Data:  Dg Chest 2 View  02/03/2014   CLINICAL DATA:  Cough  EXAM: CHEST  2 VIEW  COMPARISON:  None.  FINDINGS: Low lung volumes. Cardiac silhouette within the abdomen is normal mild area of increased density is appreciated within the medial posterior aspect of the right upper lobe. The lungs otherwise clear. No acute osseous abnormalities.  IMPRESSION: Indeterminate density right upper  lobe. Repeat surveillance evaluation and 14 - 21 days is recommended. Differential considerations include infiltrate versus atelectasis versus scarring.   Electronically Signed   By: Margaree Mackintosh M.D.   On: 02/03/2014 14:17    Assessment & Plan:   Cough - Plan: DG Chest 2 View  Abnormal chest x-ray - Plan: DG Chest 2 View  Dg Chest 2 View  02/25/2014   CLINICAL DATA:  Cough, question abnormality on recent chest x-ray  EXAM: CHEST  2 VIEW  COMPARISON:  Chest x-ray of 02/03/2014  FINDINGS: The opacity questioned overlying the right lung apex on recent chest x-ray represented degenerative change at the right first costochondral junction. No suspicious lung nodule is seen. Mediastinal and hilar regions are stable and the heart is within upper limits of normal. No acute bony abnormality is seen.  IMPRESSION: No suspicious lung lesion is seen. The area questioned recently on chest x-ray represents degenerative change at the right first costochondral junction.   Electronically Signed   By: Ivar Drape M.D.   On: 02/25/2014 16:21   Followup chest x-ray, completely normal, no followup needed. No rub of clinical concern either. Reassured the patient.  Follow-up: No Follow-up on file. Unless noted above, the patient is to follow-up if symptoms worsen. Red flags were reviewed with the patient.  New Prescriptions   No medications on file   Orders Placed This Encounter  Procedures  . DG Chest 2 View    Signed,  [name] T. Ean Gettel, MD, [place]   Patient's Medications  New Prescriptions   No medications on file  Previous Medications   CARVEDILOL (COREG) 25 MG TABLET    Take 1 tablet (25 mg total) by mouth 2 (two) times daily.   FLUOXETINE (PROZAC) 20 MG CAPSULE    Take 1 capsule (20 mg total) by mouth daily.   INSULIN DETEMIR (LEVEMIR) 100 UNIT/ML INJECTION    Inject 45 Units into the skin 2 (two) times daily.   INSULIN LISPRO (HUMALOG KWIKPEN) 100 UNIT/ML INJECTION    Take 40-50-60  units   LISINOPRIL (PRINIVIL,ZESTRIL) 40 MG TABLET    TAKE 1 TABLET EVERY DAY   METFORMIN (GLUCOPHAGE-XR) 500 MG 24 HR TABLET    Take 500 mg by mouth 3 (three) times daily.   NOVOFINE 32G X 6 MM MISC    USE AS DIRECTED   PROBIOTIC PRODUCT (ALIGN) 4 MG CAPS    Take 1 capsule by mouth daily.    TRAMADOL (ULTRAM) 50 MG TABLET    TAKE 1 TABLET EVERY 6 HOURS AS          NEEDED FOR PAIN  Modified Medications   No medications on file  Discontinued Medications   DOXYCYCLINE (VIBRA-TABS) 100 MG TABLET    Take 1 tablet (100 mg total) by mouth 2 (two) times daily.

## 2014-03-24 ENCOUNTER — Ambulatory Visit (INDEPENDENT_AMBULATORY_CARE_PROVIDER_SITE_OTHER): Payer: BC Managed Care – PPO | Admitting: Internal Medicine

## 2014-03-24 ENCOUNTER — Encounter: Payer: Self-pay | Admitting: Internal Medicine

## 2014-03-24 VITALS — BP 122/72 | HR 98 | Temp 99.0°F | Ht 63.0 in | Wt 149.0 lb

## 2014-03-24 DIAGNOSIS — I798 Other disorders of arteries, arterioles and capillaries in diseases classified elsewhere: Secondary | ICD-10-CM

## 2014-03-24 DIAGNOSIS — E1159 Type 2 diabetes mellitus with other circulatory complications: Secondary | ICD-10-CM

## 2014-03-24 NOTE — Progress Notes (Signed)
Patient ID: Kristy [name], female   DOB: 10-19-1950, 63 y.o.   MRN: [id number]  HPI: Kristy [name] is a 63 y.o.-year-old female, returning for f/u for DM2, dx 1998, insulin-dependent since 2005, uncontrolled, with complications (Cardiomyopathy). First visit 1.5 mo ago.   Last hemoglobin A1c was: Lab Results  Component Value Date   HGBA1C 14.0* 11/26/2013   HGBA1C 8.5* 03/22/2010   HGBA1C 9.1* 11/15/2009  HbA1c: ~11% in 08/2013.  Pt is on a regimen of: - Levemir 35 >> 45 in am and 30 >> 45 units qhs - Humalog 40-50-67 units tid ac - Humalog bedtime when sugars >300: 50 units >> not taking this anymore - Metformin XR added at last visit.  She was on Metformin but taken off 2/2 IBS and Crohn's ds.  She was on Avandia x 2 years, developed heart ds. She was on Lantus but caused her SOB if taken >60 units at a time (not with lower doses!?). She was on Bydureon, which really helped despite being on mealtime insulin, but too expensive. She was on Invokana >> thirst, increased urination Ronny Bacon is paying 400$ a mo for her insulins!  Pt checks her sugars 2-3x a day and they are: - am: 130-300 >> 142-231 >> 155-240 >> 152-288 - 2h after b'fast: n/c >> 311-365 >> 236, 354 - before lunch: n/c >> 88-180 >> 77-210 >> 170, 336 - 1h after lunch: 300s >> 125, 351, 352 >> 174-253 (405) > 68-445 - before dinner: n/c >> 132-253 >> 69-220 >> 76-170-415 - 2h after dinner: n/c >> 107, 310, 332, 367 >> 199-386 >> 225-470 - bedtime: 300s >> 169, 178 >> n/c >> 354-382 Had 2 lows in last 2 weeks that she could catch, but she feels like she is dropping her sugars most often - she feels like her "body is fighting the insulin". Lowest sugar was 76; she has hypoglycemia awareness ? level.  Highest sugar was 470.  Pt's meals JQB:HALP prepared >> does not eat out. - Breakfast: 1 scrambled egg on toast - Lunch: 4 crackers, 1 slice cheese, 1/2 apple - Dinner: chicken + veggies - Snacks: 3: yoghurt, 1/2 apple,  graham crackers  - Mild CKD, last BUN/creatinine:  Lab Results  Component Value Date   BUN 16 10/24/2010   CREATININE 1.2 10/24/2010  On Lisinopril. - last set of lipids: Lab Results  Component Value Date   CHOL 170 04/26/2009   HDL 33* 04/26/2009   LDLCALC 75 04/26/2009   LDLDIRECT 123.3 04/26/2010   TRIG 310* 04/26/2009   CHOLHDL 5.2 Ratio 04/26/2009   - last eye exam was in 08/2013 - Dr Katy Fitch. No DR.  - no numbness and tingling in her feet.  She has a history of chronic yeast infections.   I reviewed pt's medications, allergies, PMH, social hx, family hx and no changes required, except as mentioned above.  ROS: Constitutional: no weight gain, + fatigue, + subjective hyperthermia Eyes: + blurry vision, no xerophthalmia ENT: no sore throat, no nodules palpated in throat, no dysphagia/odynophagia, no hoarseness, + decreased hearing, + tinnitus Cardiovascular: no CP/palpitations/leg swelling, no SOB Respiratory: no cough/SOB/wheezing Gastrointestinal: no N/V/D/C Musculoskeletal: + muscle aches/no joint aches Skin: no rashes Neurological: no tremors/numbness/tingling/dizziness  PE: BP 122/72  Pulse 98  Temp(Src) 99 F (37.2 C) (Oral)  Ht 5' 3"  (1.6 m)  Wt 149 lb (67.586 kg)  BMI 26.40 kg/m2  SpO2 95% Wt Readings from Last 3 Encounters:  03/24/14 149 lb (67.586 kg)  02/25/14  150 lb 4 oz (68.153 kg)  02/10/14 149 lb 12.8 oz (67.949 kg)   Constitutional: overweight, in NAD Eyes: PERRLA, EOMI, no exophthalmos ENT: moist mucous membranes, no thyromegaly, no cervical lymphadenopathy Cardiovascular: RRR, No MRG Respiratory: CTA B Gastrointestinal: abdomen soft, NT, ND, BS+ Musculoskeletal: no deformities, strength intact in all 4 Skin: moist, warm, no rashes Neurological: no tremor with outstretched hands, DTR normal in all 4  ASSESSMENT: 1. DM2, insulin-dependent, uncontrolled, with complications - High insulin resistance - CMP, with CHF Cardiac catheterization in  12/2008: no CAD, EF 45%, +inferior wall hypokinesis. 2D Echo in 11/2008: EF 40%, + inferior wall hypokinesis  Normal C-peptide in 2012   PLAN:  1. Patient with long-standing, uncontrolled diabetes, with high insulin resistance, and with unusual patter of increased CBGs despite increasing the insulin doses. She also has occasional lows and she feels like she is dropping her sugars fast. She can also have lows at night. We are, therefore, very limited in how to change the insulin doses.  I suspect she may be overtreated with insulin and will attempt to paradoxically decrease her doses. I very much doubt noncompliance. I advised her to check sugars both 1h and 2h after a meal since the 2h postprandial sugar can be rebound hyperglycemia.  Very challenging case!  - Other options:  We can switch to regular insulin, but I'm afraid she can have more hypoglycemia at night, especially (this is also not a pen)  We can switch to an insulin pump, but she would like to avoid this  We can try U500 but I am afraid she will drop her sugars more  (We cannot use Toujeo as she cannot take Lantus >> SOB) - In the meantime, will check her for DM1: Orders Placed This Encounter  Procedures  . C-peptide  . Glucose, Random  . Glutamic acid decarboxylase auto abs  . Anti-islet cell antibody  . Insulin antibodies, blood   - I suggested to:  Patient Instructions  Please decrease the insulin doses as follows: - Levemir 45 units 2x a day - Humalog 40-40-40 units with B-L-D Continue Metformin XR to 1000 mg 2x a day.  Please stop at the lab.  Please return in 1 month with your sugar log.  Let me know in few days if the sugars are not better. Please try to make the meals as similar as you can.   - continue checking sugars at different times of the day - check 3 times a day, rotating checks - she is up to date with yearly eye exams - will check A1c at next visit (not 3 mo yet) - Return to clinic in 1 mo with  sugar log   Component     Latest Ref Rng 03/24/2014  Glucose     70 - 99 mg/dL 126 (H)  C-Peptide     0.80 - 3.90 ng/mL 1.96  Glutamic Acid Decarb Ab     <=1.0 U/mL <1.0  Pancreatic Islet Cell Antibody     < 5 JDF Units <5  Insulin Antibodies, Human     <0.4 U/mL <0.4  Labs confirm DM2 rather than DM1. This is good news.

## 2014-03-24 NOTE — Patient Instructions (Signed)
Please decrease the insulin doses as follows: - Levemir 45 units 2x a day - Humalog 40-40-40 units with B-L-D Continue Metformin XR to 1000 mg 2x a day.  Please stop at the lab.  Please return in 1 month with your sugar log.  Let me know in few days if the sugars are not better. Please try to make the meals as similar as you can.

## 2014-03-25 LAB — GLUCOSE, RANDOM: Glucose, Bld: 126 mg/dL — ABNORMAL HIGH (ref 70–99)

## 2014-03-26 LAB — C-PEPTIDE: C PEPTIDE: 1.96 ng/mL (ref 0.80–3.90)

## 2014-03-30 LAB — GLUTAMIC ACID DECARBOXYLASE AUTO ABS

## 2014-04-01 LAB — INSULIN ANTIBODIES, BLOOD: Insulin Antibodies, Human: <0.4 U/mL (ref <0.4)

## 2014-04-01 LAB — ANTI-ISLET CELL ANTIBODY

## 2014-04-02 ENCOUNTER — Telehealth: Payer: Self-pay | Admitting: Internal Medicine

## 2014-04-02 ENCOUNTER — Encounter: Payer: Self-pay | Admitting: *Deleted

## 2014-04-02 NOTE — Telephone Encounter (Signed)
Patient Kristy [name] is retuning phone call regarding labs.

## 2014-04-06 ENCOUNTER — Other Ambulatory Visit: Payer: Self-pay | Admitting: Internal Medicine

## 2014-04-06 ENCOUNTER — Telehealth: Payer: Self-pay | Admitting: *Deleted

## 2014-04-06 DIAGNOSIS — E1159 Type 2 diabetes mellitus with other circulatory complications: Secondary | ICD-10-CM

## 2014-04-06 NOTE — Telephone Encounter (Signed)
Called pt and lvm advising her that I left her a voicemail with her lab results. Also, advised pt that she can leave a vm if I am unavailable with how her blood sugar readings are.

## 2014-04-06 NOTE — Telephone Encounter (Signed)
Larene Beach, Please have her come to see Vaughan Basta ASAP (Tue or Wed, if she has openings) - I would like her to supervise how pt is injecting her insulin and also to bring her current pens. Also, I would like her to get a diagnostic CGM. I am not sure if Vaughan Basta can put this or she needs to go upstairs. I will let Vaughan Basta know (I already discussed with her about the pt). I will place the referral.

## 2014-04-06 NOTE — Telephone Encounter (Signed)
Pt called back. Pt states that her sugars are not good. She said they are consistently high. Before lunch 200. Afternoon between 200-300. Late afternoon 360 and higher. Pt stated she is very tired late afternoons and her sugars come down when she rests. She is taking 1 metformin tablet 3 times a day (with meals) and she has moved her insulin back up to 40-50-60. She does not see much change. Please advise.

## 2014-04-13 NOTE — Telephone Encounter (Signed)
Called pt 2 times last week. Lvm for pt to return call. Pt has not called back.

## 2014-04-23 ENCOUNTER — Encounter: Payer: Self-pay | Admitting: Internal Medicine

## 2014-04-23 ENCOUNTER — Ambulatory Visit (INDEPENDENT_AMBULATORY_CARE_PROVIDER_SITE_OTHER): Payer: BC Managed Care – PPO | Admitting: Internal Medicine

## 2014-04-23 VITALS — BP 122/72 | HR 92 | Temp 98.7°F | Resp 12 | Wt 150.0 lb

## 2014-04-23 DIAGNOSIS — E1159 Type 2 diabetes mellitus with other circulatory complications: Secondary | ICD-10-CM

## 2014-04-23 DIAGNOSIS — I798 Other disorders of arteries, arterioles and capillaries in diseases classified elsewhere: Secondary | ICD-10-CM

## 2014-04-23 LAB — HEMOGLOBIN A1C: Hgb A1c MFr Bld: 10.9 % — ABNORMAL HIGH (ref 4.6–6.5)

## 2014-04-23 MED ORDER — HUMULIN R U-500 (CONCENTRATED) 500 UNIT/ML ~~LOC~~ SOLN: SUBCUTANEOUS | Status: DC

## 2014-04-23 MED ORDER — "INSULIN SYRINGE-NEEDLE U-100 31G X 5/16"" 0.5 ML MISC": Status: DC

## 2014-04-23 NOTE — Patient Instructions (Addendum)
Please stop Humalog and Levemir. Start U500 insulin: 0.12 mL (12 units on your syringe) 30 min before each of the 3 meals. Please call me with sugars in 1-2 weeks. We may need to increase the doses at that time.  Please return in 1-1.5 month with your sugar log.

## 2014-04-23 NOTE — Progress Notes (Signed)
Patient ID: Kristy [name], female   DOB: 1951-05-22, 63 y.o.   MRN: [id number]  HPI: Kristy [name] is a 63 y.o.-year-old female, returning for f/u for DM2, dx 1998, insulin-dependent since 2005, uncontrolled, with complications (Cardiomyopathy). First visit 1.5 mo ago.   At last visit, labs indicated that she has, indeed, DM2 rather than DM1.  Last hemoglobin A1c was: Lab Results  Component Value Date   HGBA1C 14.0* 11/26/2013   HGBA1C 8.5* 03/22/2010   HGBA1C 9.1* 11/15/2009  HbA1c: ~11% in 08/2013.  Pt is on a regimen of: - Levemir 45 units 2x a day - Humalog 40-40-40 >> 40-60-60 units with B-L-D - Metformin XR 1000 mg bid  She was on Metformin but taken off 2/2 IBS and Crohn's ds.  She was on Avandia x 2 years, developed heart ds. She was on Lantus but caused her SOB if taken >60 units at a time (not with lower doses!?). She was on Bydureon, which really helped despite being on mealtime insulin, but too expensive. She was on Invokana >> thirst, increased urination Kristy [name] is paying 400$ a mo for her insulins!  Pt checks her sugars 2-3x a day and they are very high, despite using >200 units of insulin a day: - am: 130-300 >> 142-231 >> 155-240 >> 152-288 >> 199-281 - 2h after b'fast: n/c >> 311-365 >> 236, 354 >> 214-402 - before lunch: n/c >> 88-180 >> 77-210 >> 170, 336 >> 201-384 - 1h after lunch: 300s >> 125, 351, 352 >> 174-253 (405) > 68-445 >> 121-276 - before dinner: n/c >> 132-253 >> 69-220 >> 76-170-415 >> 176-308 - 2h after dinner: n/c >> 107, 310, 332, 367 >> 199-386 >> 225-470 >> 268-477 - bedtime: 300s >> 169, 178 >> n/c >> 354-382 >> 268-408 No Lows.  We tried to bring her back for an appt with Kristy [name] for injection supervision and maybe placing a diagnostic CGM >> we could not get in touch with her.  Pt's meals NLG:XQJJ prepared >> does not eat out. - Breakfast: 1 scrambled egg on toast - Lunch: 4 crackers, 1 slice cheese, 1/2 apple - Dinner: chicken  + veggies + sometimes a roll - Snacks: 3: yoghurt, 1/2 apple, graham crackers  - Mild CKD, last BUN/creatinine:  Lab Results  Component Value Date   BUN 16 10/24/2010   CREATININE 1.2 10/24/2010  On Lisinopril. - last set of lipids: Lab Results  Component Value Date   CHOL 170 04/26/2009   HDL 33* 04/26/2009   LDLCALC 75 04/26/2009   LDLDIRECT 123.3 04/26/2010   TRIG 310* 04/26/2009   CHOLHDL 5.2 Ratio 04/26/2009   - last eye exam was in 08/2013 - Dr Kristy [name]. No DR.  - no numbness and tingling in her feet.  She has a history of chronic yeast infections.   I reviewed pt's medications, allergies, PMH, social hx, family hx and no changes required, except as mentioned above.  ROS: Constitutional: no weight gain, + fatigue, + subjective hyperthermia, + nocturia Eyes: + blurry vision, no xerophthalmia ENT: no sore throat, no nodules palpated in throat, no dysphagia/odynophagia, no hoarseness, + decreased hearing, + tinnitus Cardiovascular: no CP/palpitations/leg swelling, no SOB Respiratory: no cough/SOB/wheezing Gastrointestinal: no N/V/D/C Musculoskeletal: + muscle aches/no joint aches Skin: no rashes Neurological: no tremors/numbness/tingling/dizziness  PE: BP 122/72  Pulse 92  Temp(Src) 98.7 F (37.1 C) (Oral)  Resp 12  Wt 150 lb (68.04 kg)  SpO2 95% Wt Readings from Last 3 Encounters:  04/23/14 150  lb (68.04 kg)  03/24/14 149 lb (67.586 kg)  02/25/14 150 lb 4 oz (68.153 kg)   Constitutional: overweight, in NAD Eyes: PERRLA, EOMI, no exophthalmos ENT: moist mucous membranes, no thyromegaly, no cervical lymphadenopathy Cardiovascular: RRR, No MRG Respiratory: CTA B Gastrointestinal: abdomen soft, NT, ND, BS+ Musculoskeletal: no deformities, strength intact in all 4 Skin: moist, warm, no rashes Neurological: no tremor with outstretched hands, DTR normal in all 4  ASSESSMENT: 1. DM2, insulin-dependent, uncontrolled, with complications - High insulin resistance - CMP,  with CHF Cardiac catheterization in 12/2008: no CAD, EF 45%, +inferior wall hypokinesis. 2D Echo in 11/2008: EF 40%, + inferior wall hypokinesis  Component     Latest Ref Rng 03/24/2014  Glucose     70 - 99 mg/dL 126 (H)  C-Peptide     0.80 - 3.90 ng/mL 1.96  Glutamic Acid Decarb Ab     <=1.0 U/mL <1.0  Pancreatic Islet Cell Antibody     < 5 JDF Units <5  Insulin Antibodies, Human     <0.4 U/mL <0.4  Normal C-peptide in 2012   PLAN:  1. Patient with long-standing, uncontrolled diabetes, with high insulin resistance. She has no recent low CBGs, but sugars are 200-400. Due to her high insulin resistance (requiring >200 units insulin a day), we decided to start U500: - I suggested to:  Patient Instructions  Please stop Humalog and Levemir. Start U500 insulin: 0.12 mL (12 units on your syringe) 30 min before each of the 3 meals. Please call me with sugars in 1-2 weeks. We may need to increase the doses at that time. Please return in 1-1.5 month with your sugar log.  - continue checking sugars at different times of the day - check 3 times a day, rotating checks - she is up to date with yearly eye exams - will check A1c today - we can still try to get her the dx CGM, but only if this is free to her - Return to clinic in 1-1.5 mo with sugar log (she cannot afford frequent visits, she tells me)  Office Visit on 04/23/2014  Component Date Value Ref Range Status  . Hemoglobin A1C 04/23/2014 10.9* 4.6 - 6.5 % Final   Glycemic Control Guidelines for People with Diabetes:Non Diabetic:  <6%Goal of Therapy: <7%Additional Action Suggested:  >8%    HbA1c decreased from 14%!

## 2014-06-02 ENCOUNTER — Encounter: Payer: Self-pay | Admitting: Internal Medicine

## 2014-06-02 ENCOUNTER — Ambulatory Visit (INDEPENDENT_AMBULATORY_CARE_PROVIDER_SITE_OTHER): Payer: BC Managed Care – PPO | Admitting: Internal Medicine

## 2014-06-02 VITALS — BP 124/68 | HR 116 | Temp 98.7°F | Resp 12 | Wt 153.0 lb

## 2014-06-02 DIAGNOSIS — I798 Other disorders of arteries, arterioles and capillaries in diseases classified elsewhere: Secondary | ICD-10-CM

## 2014-06-02 DIAGNOSIS — E1159 Type 2 diabetes mellitus with other circulatory complications: Secondary | ICD-10-CM

## 2014-06-02 MED ORDER — HUMULIN R U-500 (CONCENTRATED) 500 UNIT/ML ~~LOC~~ SOLN: SUBCUTANEOUS | Status: DC

## 2014-06-02 NOTE — Patient Instructions (Addendum)
Please increase the U500 insulin to 0.14 mL (14 units on the syringe) before each meal. Continue Metformin 500 mg 3x a day. Please return in 1.5 month with your sugar log.

## 2014-06-02 NOTE — Progress Notes (Signed)
Patient ID: Kristy [name], female   DOB: [birth year], 63 y.o.   MRN: [id number]  HPI: Kristy [name] is a 63 y.o.-year-old female, returning for f/u for DM1 , dx 1998, insulin-dependent since 2005, uncontrolled, with complications (Cardiomyopathy). First visit 1.5 mo ago.   Last hemoglobin A1c was: Lab Results  Component Value Date   HGBA1C 10.9* 04/23/2014   HGBA1C 14.0* 11/26/2013   HGBA1C 8.5* 03/22/2010  HbA1c: ~11% in 08/2013.  Pt was on a regimen of: - Levemir 45 units 2x a day - Humalog 40-40-40 >> 40-60-60 units with B-L-D - Metformin XR 1000 mg bid  At last visit we changed to: - U500 insulin 0.12 mL before each meal >> she likes this and this is less expensive. She gets flushed right after inj but resolves fast.  She was on Metformin but taken off 2/2 IBS and Crohn's ds.  She was on Avandia x 2 years, developed heart ds. She was on Lantus but caused her SOB if taken >60 units at a time (not with lower doses!?). She was on Bydureon, which really helped despite being on mealtime insulin, but too expensive. She was on Invokana >> thirst, increased urination Ronny Bacon is paying 400$ a mo for her insulins!  Pt checks her sugars 2-3x a day and they are high, but we started a lower U500 dose on purpose, to avoid lows: - am: 130-300 >> 142-231 >> 155-240 >> 152-288 >> 199-281 >> 142-333 - 2h after b'fast: n/c >> 311-365 >> 236, 354 >> 214-402 >> 179-338 - before lunch: n/c >> 88-180 >> 77-210 >> 170, 336 >> 201-384 >> 454, 460 - 1h after lunch: 300s >> 125, 351, 352 >> 174-253 (405) > 68-445 >> 121-276 >> 288-398 - before dinner: n/c >> 132-253 >> 69-220 >> 76-170-415 >> 176-308 >> 194-429 - 2h after dinner: n/c >> 107, 310, 332, 367 >> 199-386 >> 225-470 >> 268-477 >> 169-499 - bedtime: 300s >> 169, 178 >> n/c >> 354-382 >> 268-408 >> n/c No Lows, except 45 right after we started the U500.  Pt's meals CBU:LAGT prepared >> does not eat out. - Breakfast: 1 scrambled egg on toast -  Lunch: 4 crackers, 1 slice cheese, 1/2 apple - Dinner: chicken + veggies + sometimes a roll - Snacks: 3: yoghurt, 1/2 apple, graham crackers  - Mild CKD, last BUN/creatinine:  Lab Results  Component Value Date   BUN 16 10/24/2010   CREATININE 1.2 10/24/2010  On Lisinopril. - last set of lipids: Lab Results  Component Value Date   CHOL 170 04/26/2009   HDL 33* 04/26/2009   LDLCALC 75 04/26/2009   LDLDIRECT 123.3 04/26/2010   TRIG 310* 04/26/2009   CHOLHDL 5.2 Ratio 04/26/2009   - last eye exam was in 08/2013 - Dr Katy Fitch. No DR.  - no numbness and tingling in her feet.  She has a history of chronic yeast infections.   I reviewed pt's medications, allergies, PMH, social hx, family hx and no changes required, except as mentioned above.  ROS: Constitutional+no weight gain, nofatigue, + subjective hyperthermia, np nocturia Eyes:no blurry vision, no xerophthalmia ENT: no sore throat, no nodules palpated in throat, no dysphagia/odynophagia, no hoarseness, + decreased hearing, + tinnitus Cardiovascular: no CP/palpitations/leg swelling, no SOB Respiratory: no cough/SOB/wheezing Gastrointestinal: no N/V/D/C Musculoskeletal: no muscle aches/no joint aches Skin: no rashes Neurological: no tremors/numbness/tingling/dizziness  PE: BP 124/68  Pulse 116  Temp(Src) 98.7 F (37.1 C) (Oral)  Resp 12  Wt 153 lb (69.4 kg)  SpO2 97% Wt Readings from Last 3 Encounters:  06/02/14 153 lb (69.4 kg)  04/23/14 150 lb (68.04 kg)  03/24/14 149 lb (67.586 kg)   Constitutional: overweight, in NAD Eyes: PERRLA, EOMI, no exophthalmos ENT: moist mucous membranes, no thyromegaly, no cervical lymphadenopathy Cardiovascular: RRR, No MRG Respiratory: CTA B Gastrointestinal: abdomen soft, NT, ND, BS+ Musculoskeletal: no deformities, strength intact in all 4 Skin: moist, warm, no rashes Neurological: no tremor with outstretched hands, DTR normal in all 4  ASSESSMENT: 1. DM2, insulin-dependent,  uncontrolled, with complications - High insulin resistance - CMP, with CHF Cardiac catheterization in 12/2008: no CAD, EF 45%, +inferior wall hypokinesis. 2D Echo in 11/2008: EF 40%, + inferior wall hypokinesis  Component     Latest Ref Rng 03/24/2014  Glucose     70 - 99 mg/dL 126 (H)  C-Peptide     0.80 - 3.90 ng/mL 1.96  Glutamic Acid Decarb Ab     <=1.0 U/mL <1.0  Pancreatic Islet Cell Antibody     < 5 JDF Units <5  Insulin Antibodies, Human     <0.4 U/mL <0.4  Normal C-peptide in 2012   PLAN:  1. Patient with long-standing, uncontrolled diabetes, with high insulin resistance. She has no recent low CBGs, sugars are 200-400. We decided to start U500 at last visit due to using >200 units a day of U100 and also using many injections. She likes this regimen, but sugars still high >> will need to adjust doses up. At last visit, I advised her to call me in 2 weeks to see if we need to adjust her doses up but she did not do so. - I suggested to:  Patient Instructions  Please increase the U500 insulin to 0.14 mL (14 units on the syringe) before each meal. I advised her to start with b'fast and then with the rest of the meals, in succession. Continue Metformin 500 mg 3x a day. Please return in 1.5 month with your sugar log.   - continue checking sugars at different times of the day - check 3 times a day, rotating checks - she is up to date with yearly eye exams - Return to clinic in 1.5 mo with sugar log (she cannot afford frequent visits)

## 2014-06-17 ENCOUNTER — Encounter: Payer: Self-pay | Admitting: Cardiovascular Disease

## 2014-06-17 ENCOUNTER — Ambulatory Visit (INDEPENDENT_AMBULATORY_CARE_PROVIDER_SITE_OTHER): Payer: BC Managed Care – PPO | Admitting: Cardiovascular Disease

## 2014-06-17 VITALS — BP 140/80 | HR 98 | Ht 62.0 in | Wt 154.2 lb

## 2014-06-17 DIAGNOSIS — E1159 Type 2 diabetes mellitus with other circulatory complications: Secondary | ICD-10-CM

## 2014-06-17 DIAGNOSIS — E785 Hyperlipidemia, unspecified: Secondary | ICD-10-CM

## 2014-06-17 DIAGNOSIS — I798 Other disorders of arteries, arterioles and capillaries in diseases classified elsewhere: Secondary | ICD-10-CM

## 2014-06-17 DIAGNOSIS — R0789 Other chest pain: Secondary | ICD-10-CM

## 2014-06-17 DIAGNOSIS — I10 Essential (primary) hypertension: Secondary | ICD-10-CM

## 2014-06-17 NOTE — Assessment & Plan Note (Signed)
Followed by endocrinology We have encouraged careful diet management

## 2014-06-17 NOTE — Patient Instructions (Addendum)
You are doing well. No medication changes were made.  Please call us if you have new issues that need to be addressed before your next appt.  Your physician wants you to follow-up in: 12 months.  You will receive a reminder letter in the mail two months in advance. If you don't receive a letter, please call our office to schedule the follow-up appointment. 

## 2014-06-17 NOTE — Progress Notes (Signed)
Patient ID: Kristy [name], female    DOB: 07-04-1951, 63 y.o.   MRN: [id number]  HPI Comments: 63 yo with HTN, diabetes, and mild nonischemic CM,  history of GI disease/IBS, high cholesterol, remote h/o smoking,  presents for followup. Previous cardiac catheterization in 2009 showed mild luminal irregularities. She does have underlying Crohn's disease.   Diabetes is being managed by endocrinology.  Her hemoglobin A1c was in the 14 range, now down to 11 She is very active, walks up to 5 miles per day at work with no symptoms of chest pain or shortness of breath.  unable to tolerate statins.  No recent lipid panel available Blood pressure has been well-controlled, no orthostasis recently  Last echo in 10/10 showed EF 50-55% with dysynnergy due to left bundle branch block.    ECG: normal sinus rhythm with left bundle branch block, rate 98 beats per minute    Outpatient Encounter Prescriptions as of 06/17/2014  Medication Sig  . carvedilol (COREG) 25 MG tablet Take 1 tablet (25 mg total) by mouth 2 (two) times daily.  Marland Kitchen FLUoxetine (PROZAC) 20 MG capsule Take 1 capsule (20 mg total) by mouth daily.  . insulin regular human CONCENTRATED (HUMULIN R) 500 UNIT/ML SOLN injection Please inject 0.14 mL into the skin 3x a day, 30 min before meals.  . Insulin Syringe-Needle U-100 (BD INSULIN SYRINGE ULTRAFINE) 31G X 5/16" 0.5 ML MISC Use 3x a day  . lisinopril (PRINIVIL,ZESTRIL) 40 MG tablet TAKE 1 TABLET EVERY DAY  . metFORMIN (GLUCOPHAGE-XR) 500 MG 24 hr tablet Take 500 mg by mouth 3 (three) times daily.  . Probiotic Product (ALIGN) 4 MG CAPS Take 1 capsule by mouth daily.   . traMADol (ULTRAM) 50 MG tablet TAKE 1 TABLET EVERY 6 HOURS AS          NEEDED FOR PAIN   Review of Systems  Constitutional: Negative.   HENT: Negative.   Eyes: Negative.   Respiratory: Negative.   Cardiovascular: Negative.   Gastrointestinal: Negative.   Endocrine: Negative.   Musculoskeletal: Negative.   Skin:  Negative.   Allergic/Immunologic: Negative.   Neurological: Negative.   Hematological: Negative.   Psychiatric/Behavioral: Negative.   All other systems reviewed and are negative.   BP 140/80  Pulse 98  Ht 5' 2"  (1.575 m)  Wt 154 lb 4 oz (69.967 kg)  BMI 28.21 kg/m2   Physical Exam  Nursing note and vitals reviewed. Constitutional: She is oriented to person, place, and time. She appears well-developed and well-nourished.  HENT:  Head: Normocephalic.  Nose: Nose normal.  Mouth/Throat: Oropharynx is clear and moist.  Eyes: Conjunctivae are normal. Pupils are equal, round, and reactive to light.  Neck: Normal range of motion. Neck supple. No JVD present.  Cardiovascular: Regular rhythm, S1 normal, S2 normal, normal heart sounds and intact distal pulses.  Tachycardia present.  Exam reveals no gallop and no friction rub.   No murmur heard. Pulmonary/Chest: Effort normal and breath sounds normal. No respiratory distress. She has no wheezes. She has no rales. She exhibits no tenderness.  Abdominal: Soft. Bowel sounds are normal. She exhibits no distension. There is no tenderness.  Musculoskeletal: Normal range of motion. She exhibits no edema and no tenderness.  Lymphadenopathy:    She has no cervical adenopathy.  Neurological: She is alert and oriented to person, place, and time. Coordination normal.  Skin: Skin is warm and dry. No rash noted. No erythema.  Psychiatric: She has a normal mood and affect.  Her behavior is normal. Judgment and thought content normal.    Assessment and Plan

## 2014-06-17 NOTE — Assessment & Plan Note (Signed)
Blood pressure is well controlled on today's visit. No changes made to the medications. 

## 2014-06-17 NOTE — Assessment & Plan Note (Addendum)
Statin intolerance per the patient. She has never tried Crestor. Will wait for more recent lipid panel before making a decision

## 2014-07-10 ENCOUNTER — Other Ambulatory Visit: Payer: Self-pay | Admitting: Cardiovascular Disease

## 2014-07-14 ENCOUNTER — Ambulatory Visit: Payer: BC Managed Care – PPO | Admitting: Internal Medicine

## 2014-07-20 ENCOUNTER — Encounter: Payer: Self-pay | Admitting: Internal Medicine

## 2014-07-20 ENCOUNTER — Ambulatory Visit (INDEPENDENT_AMBULATORY_CARE_PROVIDER_SITE_OTHER): Payer: BC Managed Care – PPO | Admitting: Internal Medicine

## 2014-07-20 VITALS — BP 122/64 | HR 94 | Temp 98.8°F | Resp 12 | Wt 152.8 lb

## 2014-07-20 DIAGNOSIS — E1165 Type 2 diabetes mellitus with hyperglycemia: Secondary | ICD-10-CM

## 2014-07-20 DIAGNOSIS — E1159 Type 2 diabetes mellitus with other circulatory complications: Secondary | ICD-10-CM

## 2014-07-20 DIAGNOSIS — E1151 Type 2 diabetes mellitus with diabetic peripheral angiopathy without gangrene: Secondary | ICD-10-CM

## 2014-07-20 DIAGNOSIS — IMO0001 Reserved for inherently not codable concepts without codable children: Secondary | ICD-10-CM | POA: Insufficient documentation

## 2014-07-20 MED ORDER — INSULIN REGULAR HUMAN (CONC) 500 UNIT/ML ~~LOC~~ SOLN: 85.0000 [IU] | Freq: Three times a day (TID) | SUBCUTANEOUS | Status: DC

## 2014-07-20 NOTE — Progress Notes (Addendum)
Patient ID: Kristy [name], female   DOB: [birth year], 63 y.o.   MRN: [id number]  HPI: MALAYSIA [name] is a 63 y.o.-year-old female, returning for f/u for DM1 , dx 1998, insulin-dependent since 2005, uncontrolled, with complications (Cardiomyopathy). Last visit 1.5 mo ago.   She continues to have a vaginal yeast infection and sinus congestion.  Last hemoglobin A1c was: Lab Results  Component Value Date   HGBA1C 10.9* 04/23/2014   HGBA1C 14.0* 11/26/2013   HGBA1C 8.5* 03/22/2010  HbA1c: ~11% in 08/2013.  Pt was on a regimen of: - Levemir 45 units 2x a day - Humalog 40-40-40 >> 40-60-60 units with B-L-D - Metformin XR 1000 mg bid  We changed to: - U500 insulin 0.12 >> 0.14 >> 1.15 mL before each meal >> she likes this and this is less expensive.   She was on Metformin but taken off 2/2 IBS and Crohn's ds.  She was on Avandia x 2 years, developed heart ds. She was on Lantus but caused her SOB if taken >60 units at a time (not with lower doses!?). She was on Bydureon, which really helped despite being on mealtime insulin, but too expensive. She was on Invokana >> thirst, increased urination Ronny Bacon is paying 400$ a mo for her insulins!  Pt checks her sugars 2-3x a day and they are still high: - am: 130-300 >> 142-231 >> 155-240 >> 152-288 >> 199-281 >> 142-333 >> 172-260, 310 - 2h after b'fast: n/c >> 311-365 >> 236, 354 >> 214-402 >> 179-338 >> 216-402 - before lunch: n/c >> 88-180 >> 77-210 >> 170, 336 >> 201-384 >> 454, 460 >> 380-461 - 2h after lunch: 300s >> 125, 351, 352 >> 174-253 (405) > 68-445 >> 121-276 >> 288-398 >> 273-402 - before dinner: n/c >> 132-253 >> 69-220 >> 76-170-415 >> 176-308 >> 194-429 >> 276-384, 462 - 2h after dinner: n/c >> 107, 310, 332, 367 >> 199-386 >> 225-470 >> 268-477 >> 169-499 >> 208, 272-472, 497 - bedtime: 300s >> 169, 178 >> n/c >> 354-382 >> 268-408 >> n/c No Lows, except 45 right after we started the U500.  Pt's meals UKG:URKY prepared >> does  not eat out. - Breakfast: 1 scrambled egg on toast - Lunch: 4 crackers, 1 slice cheese, 1/2 apple - Dinner: chicken + veggies + sometimes a roll - Snacks: 3: yoghurt, 1/2 apple, graham crackers  - Mild CKD, last BUN/creatinine:  Lab Results  Component Value Date   BUN 16 10/24/2010   CREATININE 1.2 10/24/2010  On Lisinopril. - last set of lipids: Lab Results  Component Value Date   CHOL 170 04/26/2009   HDL 33* 04/26/2009   LDLCALC 75 04/26/2009   LDLDIRECT 123.3 04/26/2010   TRIG 310* 04/26/2009   CHOLHDL 5.2 Ratio 04/26/2009   - last eye exam was in 08/2013 - Dr Katy Fitch. No DR.  - no numbness and tingling in her feet.  She has a history of chronic yeast infections.   I reviewed pt's medications, allergies, PMH, social hx, family hx and no changes required, except as mentioned above.  ROS: Constitutional+no weight gain, nofatigue, + subjective hyperthermia, np nocturia Eyes:no blurry vision, no xerophthalmia ENT: no sore throat, no nodules palpated in throat, no dysphagia/odynophagia, no hoarseness, + decreased hearing, + tinnitus Cardiovascular: no CP/palpitations/leg swelling, no SOB Respiratory: no cough/SOB/wheezing Gastrointestinal: no N/V/D/C Musculoskeletal: no muscle aches/no joint aches Skin: no rashes Neurological: no tremors/numbness/tingling/dizziness  PE: BP 122/64  Pulse 94  Temp(Src) 98.8 F (37.1 C) (  Oral)  Resp 12  Wt 152 lb 12.8 oz (69.31 kg)  SpO2 95% Body mass index is 27.94 kg/(m^2). Wt Readings from Last 3 Encounters:  07/20/14 152 lb 12.8 oz (69.31 kg)  06/17/14 154 lb 4 oz (69.967 kg)  06/02/14 153 lb (69.4 kg)   Constitutional: overweight, in NAD Eyes: PERRLA, EOMI, no exophthalmos ENT: moist mucous membranes, no thyromegaly, no cervical lymphadenopathy Cardiovascular: RRR, No MRG Respiratory: CTA B Gastrointestinal: abdomen soft, NT, ND, BS+ Musculoskeletal: no deformities, strength intact in all 4 Skin: moist, warm, no  rashes Neurological: no tremor with outstretched hands, DTR normal in all 4  ASSESSMENT: 1. DM2, insulin-dependent, uncontrolled, with complications - High insulin resistance - CMP, with CHF Cardiac catheterization in 12/2008: no CAD, EF 45%, +inferior wall hypokinesis. 2D Echo in 11/2008: EF 40%, + inferior wall hypokinesis  Component     Latest Ref Rng 03/24/2014  Glucose     70 - 99 mg/dL 126 (H)  C-Peptide     0.80 - 3.90 ng/mL 1.96  Glutamic Acid Decarb Ab     <=1.0 U/mL <1.0  Pancreatic Islet Cell Antibody     < 5 JDF Units <5  Insulin Antibodies, Human     <0.4 U/mL <0.4  Normal C-peptide in 2012   2. HL  PLAN:  1. Patient with long-standing, uncontrolled diabetes, with high insulin resistance. She has no recent low CBGs, sugars are still in the 200-400.  - I suggested to:  Patient Instructions  Please increase the U500 insulin to 0.17 mL (17 units on the syringe) before each meal. Continue Metformin 500 mg 3x a day. Please return in 1.5 month with your sugar log.   - I advised her to increase by 0.02 mL every 3 days if sugars still stay high. - continue checking sugars at different times of the day - check 3 times a day, rotating checks - she is up to date with yearly eye exams - will check a HbA1c, CMP, Lipids in 4 days as she needs to come fasting - refuses flu vaccine today - Return to clinic in 1.5 mo with sugar log  2. HL - Patient not on a statin - Her most recent LDL was from 2011, we will repeat the lipid panel today  Component     Latest Ref Rng 07/27/2014  Sodium     135 - 145 mEq/L 140  Potassium     3.5 - 5.3 mEq/L 4.2  Chloride     96 - 112 mEq/L 102  CO2     19 - 32 mEq/L 28  Glucose     70 - 99 mg/dL 161 (H)  BUN     6 - 23 mg/dL 10  Creatinine     0.50 - 1.10 mg/dL 0.72  Total Bilirubin     0.2 - 1.2 mg/dL 0.5  Alkaline Phosphatase     39 - 117 U/L 95  AST     0 - 37 U/L 66 (H)  ALT     0 - 35 U/L 61 (H)  Total Protein     6.0 -  8.3 g/dL 6.6  Albumin     3.5 - 5.2 g/dL 4.1  Calcium     8.4 - 10.5 mg/dL 9.5  GFR, Est African American      >89  GFR, Est Non African American      89  Cholesterol     0 - 200 mg/dL 272 (H)  Triglycerides  0.0 - 149.0 mg/dL 206.0 (H)  HDL     >39.00 mg/dL 37.50 (L)  VLDL     0.0 - 40.0 mg/dL 41.2 (H)  Total CHOL/HDL Ratio      7  NonHDL      234.50  Hemoglobin A1C     4.6 - 6.5 % 11.6 (H)  Direct LDL      195.5   Severe hyperlipidemia. Will suggest starting Crestor 5 mg daily. This will need to be adjusted based on her lipid panel, but I'm reticent to start her on a higher dose due to her elevated AST and ALT.  I would like to repeat this when she comes back, along with her liver function tests.

## 2014-07-20 NOTE — Patient Instructions (Signed)
Please increase the U500 insulin to 0.17 mL (17 units on the syringe) before each meal.  Continue Metformin 500 mg 3x a day. Please return in 1.5 month with your sugar log.

## 2014-07-27 ENCOUNTER — Other Ambulatory Visit (INDEPENDENT_AMBULATORY_CARE_PROVIDER_SITE_OTHER): Payer: BC Managed Care – PPO

## 2014-07-27 DIAGNOSIS — E1165 Type 2 diabetes mellitus with hyperglycemia: Secondary | ICD-10-CM

## 2014-07-27 DIAGNOSIS — IMO0001 Reserved for inherently not codable concepts without codable children: Secondary | ICD-10-CM

## 2014-07-27 LAB — LIPID PANEL
CHOL/HDL RATIO: 7
Cholesterol: 272 mg/dL — ABNORMAL HIGH (ref 0–200)
HDL: 37.5 mg/dL — ABNORMAL LOW (ref >39.00)
NonHDL: 234.5
Triglycerides: 206 mg/dL — ABNORMAL HIGH (ref 0.0–149.0)
VLDL: 41.2 mg/dL — AB (ref 0.0–40.0)

## 2014-07-27 LAB — COMPLETE METABOLIC PANEL WITH GFR
ALK PHOS: 95 U/L (ref 39–117)
ALT: 61 U/L — ABNORMAL HIGH (ref 0–35)
AST: 66 U/L — AB (ref 0–37)
Albumin: 4.1 g/dL (ref 3.5–5.2)
BUN: 10 mg/dL (ref 6–23)
CO2: 28 mEq/L (ref 19–32)
Calcium: 9.5 mg/dL (ref 8.4–10.5)
Chloride: 102 mEq/L (ref 96–112)
Creat: 0.72 mg/dL (ref 0.50–1.10)
GFR, Est African American: >89 mL/min
GFR, Est Non African American: 89 mL/min
Glucose, Bld: 161 mg/dL — ABNORMAL HIGH (ref 70–99)
POTASSIUM: 4.2 meq/L (ref 3.5–5.3)
Sodium: 140 mEq/L (ref 135–145)
Total Bilirubin: 0.5 mg/dL (ref 0.2–1.2)
Total Protein: 6.6 g/dL (ref 6.0–8.3)

## 2014-07-27 LAB — HEMOGLOBIN A1C: Hgb A1c MFr Bld: 11.6 % — ABNORMAL HIGH (ref 4.6–6.5)

## 2014-07-27 LAB — LDL CHOLESTEROL, DIRECT: LDL DIRECT: 195.5 mg/dL

## 2014-07-29 MED ORDER — ROSUVASTATIN CALCIUM 5 MG PO TABS: 5.0000 mg | ORAL_TABLET | Freq: Every day | ORAL | Status: DC

## 2014-07-29 NOTE — Addendum Note (Signed)
Addended by: Philemon Kingdom on: 07/29/2014 03:05 PM   Modules accepted: Orders

## 2014-08-17 DIAGNOSIS — C4491 Basal cell carcinoma of skin, unspecified: Secondary | ICD-10-CM

## 2014-08-17 HISTORY — DX: Basal cell carcinoma of skin, unspecified: C44.91

## 2014-09-07 ENCOUNTER — Encounter: Payer: Self-pay | Admitting: Internal Medicine

## 2014-09-07 ENCOUNTER — Ambulatory Visit (INDEPENDENT_AMBULATORY_CARE_PROVIDER_SITE_OTHER): Payer: BC Managed Care – PPO | Admitting: Internal Medicine

## 2014-09-07 VITALS — BP 128/68 | HR 100 | Temp 98.8°F | Resp 12 | Wt 153.8 lb

## 2014-09-07 DIAGNOSIS — E1151 Type 2 diabetes mellitus with diabetic peripheral angiopathy without gangrene: Secondary | ICD-10-CM

## 2014-09-07 DIAGNOSIS — E1159 Type 2 diabetes mellitus with other circulatory complications: Secondary | ICD-10-CM

## 2014-09-07 DIAGNOSIS — E785 Hyperlipidemia, unspecified: Secondary | ICD-10-CM

## 2014-09-07 DIAGNOSIS — R6889 Other general symptoms and signs: Secondary | ICD-10-CM

## 2014-09-07 LAB — TSH: TSH: 1.05 u[IU]/mL (ref 0.35–4.50)

## 2014-09-07 LAB — T3, FREE: T3 FREE: 3.8 pg/mL (ref 2.3–4.2)

## 2014-09-07 LAB — T4, FREE: Free T4: 0.98 ng/dL (ref 0.60–1.60)

## 2014-09-07 NOTE — Patient Instructions (Signed)
Please increase the U500 insulin to 0.21 mL (21 units on the syringe) before each meal. Increase further by 2 units every 4 days if sugars still >180s. Continue Metformin 500 mg 3x a day. Please return in 1.5 month with your sugar log.

## 2014-09-07 NOTE — Progress Notes (Signed)
Patient ID: Kristy [name], female   DOB: 1951-05-03, 63 y.o.   MRN: [id number]  HPI: Kristy [name] is a 63 y.o.-year-old female, returning for f/u for DM1 , dx 1998, insulin-dependent since 2005, uncontrolled, with complications (Cardiomyopathy). Last visit 1.5 mo ago.   DM2: Last hemoglobin A1c was: Lab Results  Component Value Date   HGBA1C 11.6* 07/27/2014   HGBA1C 10.9* 04/23/2014   HGBA1C 14.0* 11/26/2013  HbA1c: ~11% in 08/2013.  Pt was on a regimen of: - Levemir 45 units 2x a day - Humalog 40-40-40 >> 40-60-60 units with B-L-D - Metformin XR 1000 mg bid  We changed to: - U500 insulin 0.12 >> 0.14 >> 1.15 >> 0.19 mL before each meal >> she likes this and this is less expensive.   She was on Metformin but taken off 2/2 IBS and Crohn's ds.  She was on Avandia x 2 years, developed heart ds. She was on Lantus but caused her SOB if taken >60 units at a time (not with lower doses!?). She was on Bydureon, which really helped despite being on mealtime insulin, but too expensive. She was on Invokana >> thirst, increased urination Kristy [name] is paying 400$ a mo for her insulins!  Pt checks her sugars 2-3x a day and they are still high: - am: 130-300 >> 142-231 >> 155-240 >> 152-288 >> 199-281 >> 142-333 >> 172-260, 310 >> 135-282 - 2h after b'fast: n/c >> 311-365 >> 236, 354 >> 214-402 >> 179-338 >> 216-402 >> n/c - before lunch: n/c >> 88-180 >> 77-210 >> 170, 336 >> 201-384 >> 454, 460 >> 380-461 >> 189-457 - 2h after lunch: 300s >> 125, 351, 352 >> 174-253 (405) > 68-445 >> 121-276 >> 288-398 >> 273-402 >> n/c - before dinner: n/c >> 132-253 >> 69-220 >> 76-170-415 >> 176-308 >> 194-429 >> 276-384, 462 >>  181-409 - 2h after dinner: n/c >> 107, 310, 332, 367 >> 199-386 >> 225-470 >> 268-477 >> 169-499 >> 208, 272-472, 497 >> 365-459 - bedtime: 300s >> 169, 178 >> n/c >> 354-382 >> 268-408 >> n/c No Lows.   Pt's meals QIW:LNLG prepared >> does not eat out. - Breakfast: 1  scrambled egg on toast - Lunch: 4 crackers, 1 slice cheese, 1/2 apple - Dinner: chicken + veggies + sometimes a roll - Snacks: 3: yoghurt, 1/2 apple, graham crackers  - Mild CKD, last BUN/creatinine:  Lab Results  Component Value Date   BUN 10 07/27/2014   CREATININE 0.72 07/27/2014  On Lisinopril. - last set of lipids: Lab Results  Component Value Date   CHOL 272* 07/27/2014   HDL 37.50* 07/27/2014   LDLCALC 75 04/26/2009   LDLDIRECT 195.5 07/27/2014   TRIG 206.0* 07/27/2014   CHOLHDL 7 07/27/2014   - last eye exam was in 08/2013 - Dr Katy Fitch. No DR.  - no numbness and tingling in her feet.  She has a history of chronic yeast infections.   HL: Cholesterol     0 - 200 mg/dL 272 (H)  Triglycerides     0.0 - 149.0 mg/dL 206.0 (H)  HDL     >39.00 mg/dL 37.50 (L)  VLDL     0.0 - 40.0 mg/dL 41.2 (H)  Total CHOL/HDL Ratio      7  NonHDL      234.50  Hemoglobin A1C     4.6 - 6.5 % 11.6 (H)  Direct LDL      195.5   >> Severe hyperlipidemia.  I  suggested starting Crestor 5 mg daily >> did not start b/c afraid of mm pain (severe). Tried other statins >> mm pain >> would not want to restart. She has elevated AST and ALT: AST     0 - 37 U/L 66 (H)  ALT     0 - 35 U/L 61 (H)   I reviewed pt's medications, allergies, PMH, social hx, family hx and no changes required, except as mentioned above.  ROS: Constitutional+no weight gain, nofatigue, + subjective hyperthermia, no nocturia Eyes:no blurry vision, no xerophthalmia ENT: no sore throat, no nodules palpated in throat, no dysphagia/odynophagia, no hoarseness, + decreased hearing, + tinnitus Cardiovascular: no CP/palpitations/leg swelling, no SOB Respiratory: no cough/SOB/wheezing Gastrointestinal: no N/V/D/C Musculoskeletal: + muscle aches/no joint aches Skin: no rashes Neurological: no tremors/numbness/tingling/dizziness  PE: BP 128/68 mmHg  Pulse 100  Temp(Src) 98.8 F (37.1 C) (Oral)  Resp 12  Wt 153 lb 12.8  oz (69.763 kg)  SpO2 98% Body mass index is 28.12 kg/(m^2). Wt Readings from Last 3 Encounters:  09/07/14 153 lb 12.8 oz (69.763 kg)  07/20/14 152 lb 12.8 oz (69.31 kg)  06/17/14 154 lb 4 oz (69.967 kg)   Constitutional: overweight, in NAD Eyes: PERRLA, EOMI, no exophthalmos ENT: moist mucous membranes, no thyromegaly, no cervical lymphadenopathy Cardiovascular: RRR, No MRG Respiratory: CTA B Gastrointestinal: abdomen soft, NT, ND, BS+ Musculoskeletal: no deformities, strength intact in all 4 Skin: moist, warm, no rashes Neurological: no tremor with outstretched hands, DTR normal in all 4  ASSESSMENT: 1. DM2, insulin-dependent, uncontrolled, with complications - High insulin resistance - CMP, with CHF - Dr Rockey Situ Cardiac catheterization in 12/2008: no CAD, EF 45%, +inferior wall hypokinesis. 2D Echo in 11/2008: EF 40%, + inferior wall hypokinesis  Component     Latest Ref Rng 03/24/2014  Glucose     70 - 99 mg/dL 126 (H)  C-Peptide     0.80 - 3.90 ng/mL 1.96  Glutamic Acid Decarb Ab     <=1.0 U/mL <1.0  Pancreatic Islet Cell Antibody     < 5 JDF Units <5  Insulin Antibodies, Human     <0.4 U/mL <0.4  Normal C-peptide in 2012   2. HL  3. Heat intolerance  PLAN:  1. Patient with long-standing, uncontrolled diabetes, with high insulin resistance. She has no recent low CBGs, sugars are still in the 200-400.  - I suggested to:  Patient Instructions  Please increase the U500 insulin to 0.21 mL (21 units on the syringe) before each meal. Increase further by 2 units every 4 days if sugars still >180s. Continue Metformin 500 mg 3x a day. Please return in 1.5 month with your sugar log.     - I advised her to increase by 0.02 mL every 4 days if sugars still stay high. - continue checking sugars at different times of the day - check 3 times a day, rotating checks - needs new eye exams - Return to clinic in 3 mo with sugar log  2. HL - Patient not on a statin - Her most  recent LDL was 195.5 - refuses to try statins or Zetia  3. Heat intolerance - check TFTs today Last was normal 06/2013.

## 2014-10-14 ENCOUNTER — Other Ambulatory Visit: Payer: Self-pay | Admitting: Internal Medicine

## 2014-10-26 ENCOUNTER — Other Ambulatory Visit: Payer: Self-pay | Admitting: Family Medicine

## 2014-10-26 NOTE — Telephone Encounter (Signed)
Last office visit 02/25/2014 for cough.  Ok to refill?

## 2014-11-30 ENCOUNTER — Other Ambulatory Visit: Payer: Self-pay | Admitting: Cardiovascular Disease

## 2014-12-01 DIAGNOSIS — Z85828 Personal history of other malignant neoplasm of skin: Secondary | ICD-10-CM | POA: Insufficient documentation

## 2014-12-07 ENCOUNTER — Ambulatory Visit: Payer: BC Managed Care – PPO | Admitting: Internal Medicine

## 2015-03-11 ENCOUNTER — Ambulatory Visit (INDEPENDENT_AMBULATORY_CARE_PROVIDER_SITE_OTHER): Payer: BLUE CROSS/BLUE SHIELD | Admitting: Family Medicine

## 2015-03-11 ENCOUNTER — Encounter: Payer: Self-pay | Admitting: Family Medicine

## 2015-03-11 VITALS — BP 150/82 | HR 95 | Temp 98.8°F | Ht 62.0 in | Wt 153.0 lb

## 2015-03-11 DIAGNOSIS — I5022 Chronic systolic (congestive) heart failure: Secondary | ICD-10-CM | POA: Diagnosis not present

## 2015-03-11 DIAGNOSIS — I10 Essential (primary) hypertension: Secondary | ICD-10-CM | POA: Diagnosis not present

## 2015-03-11 DIAGNOSIS — E1151 Type 2 diabetes mellitus with diabetic peripheral angiopathy without gangrene: Secondary | ICD-10-CM | POA: Diagnosis not present

## 2015-03-11 DIAGNOSIS — E1159 Type 2 diabetes mellitus with other circulatory complications: Secondary | ICD-10-CM

## 2015-03-11 DIAGNOSIS — M8949 Other hypertrophic osteoarthropathy, multiple sites: Secondary | ICD-10-CM

## 2015-03-11 DIAGNOSIS — M159 Polyosteoarthritis, unspecified: Secondary | ICD-10-CM

## 2015-03-11 DIAGNOSIS — M150 Primary generalized (osteo)arthritis: Secondary | ICD-10-CM | POA: Diagnosis not present

## 2015-03-11 MED ORDER — TRAMADOL HCL 50 MG PO TABS: 50.0000 mg | ORAL_TABLET | Freq: Four times a day (QID) | ORAL | Status: DC | PRN

## 2015-03-11 NOTE — Progress Notes (Signed)
Pre visit review using our clinic review tool, if applicable. No additional management support is needed unless otherwise documented below in the visit note. 

## 2015-03-11 NOTE — Progress Notes (Signed)
Dr. [name] T. Natascha Edmonds, MD, [facility] [place], [address] Phone: [phone] Fax: [phone]  03/11/2015  Patient: Kristy [name], MRN: [id number], DOB: 1951/09/06, 64 y.o.  Primary Physician:  Owens Loffler, MD  Chief Complaint: Dizziness and Diabetes  Subjective:   Kristy [name] is a 64 y.o. very pleasant female patient who presents with the following:  L nose infection - got a lot out. Now that has gotten away - Berton Lan, MD, at [facility].  Had a skin cancer requiring MOHS at [place] on left nostril Had small boil / infection here that seems to be resolving.  Lab Results  Component Value Date   HGBA1C 11.6* 07/27/2014    She is on extremely high doses of insulin and still having difficulty managing her blood sugars.  Dr. Letta Median has been working with her.   Arthritis, refills tramadol. She does still have intermittent arthritis pain, and she will take Tylenol along with some tramadol which seems to help this.  Vertigo. This is actually been improving, but she did have some acute vertigo within the last 2 weeks.  BP Readings from Last 3 Encounters:  03/11/15 150/82  09/07/14 128/68  07/20/14 122/64    She takes her blood pressure routinely, and it is generally always normal.  This morning it is slightly elevated.  Congestive heart failure, she did have an EF that was as low as 40% 5 or 6 years ago on cardiac catheterization and echocardiogram, and most recently this is been 50-55%.  She has been compliant with taking her Coreg and her ACE inhibitor.  Past Medical History, Surgical History, Social History, Family History, Problem List, Medications, and Allergies have been reviewed and updated if relevant.  Patient Active Problem List   Diagnosis Date Noted  . Diabetes mellitus type 2, uncontrolled, without complications 14/43/1540  . Crohn's disease 10/13/2013  . Major depressive disorder, recurrent, in  full remission 05/01/2011  . TRANSAMINASES, SERUM, ELEVATED 05/01/2010  . PSORIASIS 08/02/2009  . Hyperlipidemia 05/11/2009  . IRRITABLE BOWEL SYNDROME 03/10/2009  . GOUT 02/05/2009  . CARDIOMYOPATHY, PRIMARY 01/11/2009  . Type 2 diabetes mellitus with vascular disease   . HYPERTENSION 04/18/2007  . LEFT BUNDLE BRANCH BLOCK 04/18/2007  . ALLERGIC RHINITIS 04/18/2007  . GALLSTONES 04/18/2007  . OSTEOARTHRITIS 04/18/2007  . DIVERTICULITIS, HX OF 04/18/2007    Past Medical History  Diagnosis Date  . Diabetes mellitus   . Hypertension   . Hyperlipidemia   . Gout   . Osteoarthritis   . Symptomatic cholelithiasis   . Left bundle branch block   . IBS (irritable bowel syndrome)   . Cardiomyopathy     mild nonischemic  . Allergic rhinitis   . Crohn's disease 10/13/2013    Past Surgical History  Procedure Laterality Date  . Dilation and curettage of uterus    . Vaginal hysterectomy      History   Social History  . Marital Status: Married    Spouse Name: N/A  . Number of Children: N/A  . Years of Education: N/A   Occupational History  . Not on file.   Social History Main Topics  . Smoking status: Former Research scientist (life sciences)  . Smokeless tobacco: Never Used  . Alcohol Use: No  . Drug Use: No  . Sexual Activity: Not on file   Other Topics Concern  . Not on file   Social History Narrative    Family History  Problem Relation  Age of Onset  . Hypertension Father     Allergies  Allergen Reactions  . Fish Oil Other (See Comments)    Gout  . Glimepiride     REACTION: hypoglycemia  . Guanfacine Hcl     REACTION: unspecified  . Rosiglitazone Other (See Comments)  . Rosiglitazone Maleate     REACTION: unspecified    Medication list reviewed and updated in full in [facility].   GEN: No acute illnesses, no fevers, chills. GI: No n/v/d, eating normally Pulm: No SOB Interactive and getting along well at home.  Otherwise, ROS is as per the HPI.  Objective:   BP  150/82 mmHg  Pulse 95  Temp(Src) 98.8 F (37.1 C) (Oral)  Ht 5' 2"  (1.575 m)  Wt 153 lb (69.4 kg)  BMI 27.98 kg/m2  GEN: WDWN, NAD, Non-toxic, A & O x 3 HEENT: Atraumatic, Normocephalic. Neck supple. No masses, No LAD. Ears and Nose: No external deformity. CV: RRR, No M/G/R. No JVD. No thrill. No extra heart sounds. PULM: CTA B, no wheezes, crackles, rhonchi. No retractions. No resp. distress. No accessory muscle use. EXTR: No c/c/e NEURO Normal gait.  PSYCH: Normally interactive. Conversant. Not depressed or anxious appearing.  Calm demeanor.   Laboratory and Imaging Data:  Assessment and Plan:   Type 2 diabetes mellitus with vascular disease  Primary osteoarthritis involving multiple joints  Chronic systolic congestive heart failure  Essential hypertension  Refilled medications.  Continue to try to be active and try to work on getting blood sugars under control.  Right now, she is having some issue with finances and having some issue with payment for co-pays and medication.  I still want her to be followed by endocrinology as her diabetes is very out of control and she is on an exceedingly high dose of insulin.  Class I heart failure.  She has done very well with this.  Typically normal blood pressure, I wouldn't do anything at all unless assisted continues to be elevated over time.  Follow-up: 6 mo  Signed,  [name] T. Namita Yearwood, MD   Patient's Medications  New Prescriptions   No medications on file  Previous Medications   B-D INS SYRINGE 0.5CC/31GX5/16 31G X 5/16" 0.5 ML MISC    USE AS DIRECTED THREE TIMES A DAY   CARVEDILOL (COREG) 25 MG TABLET    Take 1 tablet (25 mg total) by mouth 2 (two) times daily.   FLUOXETINE (PROZAC) 20 MG CAPSULE    TAKE ONE CAPSULE DAILY   INSULIN REGULAR HUMAN CONCENTRATED (HUMULIN R) 500 UNIT/ML SOLN INJECTION    Inject 110 Units into the skin 3 (three) times daily with meals.   LISINOPRIL (PRINIVIL,ZESTRIL) 40 MG TABLET    TAKE 1  TABLET EVERY DAY   METFORMIN (GLUCOPHAGE-XR) 500 MG 24 HR TABLET    Take 500 mg by mouth 3 (three) times daily.   PROBIOTIC PRODUCT (ALIGN) 4 MG CAPS    Take 1 capsule by mouth daily.   Modified Medications   Modified Medication Previous Medication   TRAMADOL (ULTRAM) 50 MG TABLET traMADol (ULTRAM) 50 MG tablet      Take 1 tablet (50 mg total) by mouth every 6 (six) hours as needed.    TAKE 1 TABLET EVERY 6 HOURS AS          NEEDED FOR PAIN  Discontinued Medications   INSULIN REGULAR HUMAN CONCENTRATED (HUMULIN R) 500 UNIT/ML SOLN INJECTION    Inject 0.17 mLs (85 Units total)  into the skin 3 (three) times daily with meals. Please inject 0.14 mL into the skin 3x a day, 30 min before meals.   ROSUVASTATIN (CRESTOR) 5 MG TABLET    Take 1 tablet (5 mg total) by mouth daily.

## 2015-03-12 ENCOUNTER — Telehealth: Payer: Self-pay | Admitting: *Deleted

## 2015-03-12 NOTE — Telephone Encounter (Signed)
Contacted patient about schedule mammogram.  Patient states it is done at her GYN office and it has probably been 2 or more years.  She will call them to get it scheduled.

## 2015-03-14 ENCOUNTER — Encounter: Payer: Self-pay | Admitting: Family Medicine

## 2015-03-14 DIAGNOSIS — M150 Primary generalized (osteo)arthritis: Secondary | ICD-10-CM

## 2015-03-14 DIAGNOSIS — M159 Polyosteoarthritis, unspecified: Secondary | ICD-10-CM | POA: Insufficient documentation

## 2015-03-14 DIAGNOSIS — M8949 Other hypertrophic osteoarthropathy, multiple sites: Secondary | ICD-10-CM | POA: Insufficient documentation

## 2015-04-07 LAB — HM MAMMOGRAPHY: HM MAMMO: NORMAL

## 2015-04-14 ENCOUNTER — Other Ambulatory Visit: Payer: Self-pay | Admitting: Family Medicine

## 2015-04-19 ENCOUNTER — Encounter: Payer: Self-pay | Admitting: Family Medicine

## 2015-05-08 ENCOUNTER — Other Ambulatory Visit: Payer: Self-pay | Admitting: Cardiovascular Disease

## 2015-05-26 ENCOUNTER — Other Ambulatory Visit: Payer: Self-pay | Admitting: Internal Medicine

## 2015-09-20 ENCOUNTER — Encounter: Payer: Self-pay | Admitting: Cardiovascular Disease

## 2015-09-20 ENCOUNTER — Ambulatory Visit (INDEPENDENT_AMBULATORY_CARE_PROVIDER_SITE_OTHER): Payer: BLUE CROSS/BLUE SHIELD | Admitting: Cardiovascular Disease

## 2015-09-20 VITALS — BP 140/80 | HR 125 | Ht 63.0 in | Wt 148.8 lb

## 2015-09-20 DIAGNOSIS — E785 Hyperlipidemia, unspecified: Secondary | ICD-10-CM

## 2015-09-20 DIAGNOSIS — R000 Tachycardia, unspecified: Secondary | ICD-10-CM | POA: Diagnosis not present

## 2015-09-20 DIAGNOSIS — I5022 Chronic systolic (congestive) heart failure: Secondary | ICD-10-CM

## 2015-09-20 DIAGNOSIS — E1159 Type 2 diabetes mellitus with other circulatory complications: Secondary | ICD-10-CM | POA: Diagnosis not present

## 2015-09-20 DIAGNOSIS — I10 Essential (primary) hypertension: Secondary | ICD-10-CM

## 2015-09-20 MED ORDER — ROSUVASTATIN CALCIUM 5 MG PO TABS: 5.0000 mg | ORAL_TABLET | Freq: Every day | ORAL | Status: DC

## 2015-09-20 MED ORDER — CARVEDILOL 25 MG PO TABS: 37.5000 mg | ORAL_TABLET | Freq: Two times a day (BID) | ORAL | Status: DC

## 2015-09-20 NOTE — Assessment & Plan Note (Signed)
She is willing to try Crestor 5 mg 2-3 times per week If tolerated, then would potentially try zetia 5-10 mg every other day. Would start slowly in light of her underlying GI issues

## 2015-09-20 NOTE — Assessment & Plan Note (Signed)
Heart rate 120 We will increase her carvedilol for rate control In the future, any documentation of low ejection fraction, she would qualify for Corlanor, which would assist with heart rate control

## 2015-09-20 NOTE — Patient Instructions (Addendum)
You are doing well.  Please increase the coreg up to 1 1/2 pills twice a day Call the office if you have symptoms from fast heart rate  Try crestor 5 mg as tolerated 2 to 3 x per week If tolerated, Call the office, we could add generic zetia   Please call us if you have new issues that need to be addressed before your next appt.  Your physician wants you to follow-up in: 6 months.  You will receive a reminder letter in the mail two months in advance. If you don't receive a letter, please call our office to schedule the follow-up appointment.

## 2015-09-20 NOTE — Assessment & Plan Note (Signed)
Echocardiogram in 2010 showing improved ejection fraction up to 50% If she has any signs of CHF, would repeat echocardiogram Low ejection fraction, she would qualify for corlanor for heart rate control

## 2015-09-20 NOTE — Assessment & Plan Note (Signed)
Blood pressure reasonably well controlled, recommended she increase her carvedilol up to 37.5 mill grams twice a day for better heart rate control She does not want propranolol when necessary at this time

## 2015-09-20 NOTE — Progress Notes (Signed)
Patient ID: Kristy [name], female    DOB: [birth year], 64 y.o.   MRN: [id number]  HPI Comments: 64 yo with HTN, diabetes, and mild nonischemic CM,  history of GI disease/IBS, high cholesterol, remote h/o smoking,  presents for followup of her hyperlipidemia, coronary artery disease Previous cardiac catheterization in 2009 showed mild luminal irregularities. She does have underlying Crohn's disease.  In follow-up today, cholesterol is 270, hemoglobin A1c 11 though she reports it is probably more like 14 recently She reports it does not matter what she eats, sugars continue to run high Chronic sinus tachycardia, always runs 100 up to 120 bpm despite carvedilol Heart rate this morning 120 bpm, just took her beta blocker before she came Denies any significant symptoms of shortness of breath on exertion. Goes walking daily for several miles at a time, no symptoms Takes large amounts of insulin before every meal, 110 units Reports unable to tolerate statins Blood pressure has been well-controlled, no orthostasis recently  Last echo in 10/10 showed EF 50-55% with dysynnergy due to left bundle branch block.    ECG: normal sinus rhythm with left bundle branch block, rate 120 beats per minute    Allergies  Allergen Reactions  . Fish Oil Other (See Comments)    Gout  . Glimepiride     REACTION: hypoglycemia  . Guanfacine Hcl     REACTION: unspecified  . Rosiglitazone Other (See Comments)  . Rosiglitazone Maleate     REACTION: unspecified    Current Outpatient Prescriptions on File Prior to Visit  Medication Sig Dispense Refill  . B-D INS SYRINGE 0.5CC/31GX5/16 31G X 5/16" 0.5 ML MISC USE AS DIRECTED THREE TIMES A DAY 100 each 11  . FLUoxetine (PROZAC) 20 MG capsule TAKE ONE CAPSULE DAILY 30 capsule 5  . insulin regular human CONCENTRATED (HUMULIN R) 500 UNIT/ML SOLN injection Inject 110 Units into the skin 3 (three) times daily with meals.    Marland Kitchen lisinopril (PRINIVIL,ZESTRIL) 40 MG tablet  TAKE 1 TABLET EVERY DAY 30 tablet 3  . metFORMIN (GLUCOPHAGE-XR) 500 MG 24 hr tablet Take 500 mg by mouth 3 (three) times daily.    . Probiotic Product (ALIGN) 4 MG CAPS Take 1 capsule by mouth daily.     . traMADol (ULTRAM) 50 MG tablet Take 1 tablet (50 mg total) by mouth every 6 (six) hours as needed. 90 tablet 4   No current facility-administered medications on file prior to visit.    Past Medical History  Diagnosis Date  . Diabetes mellitus   . Hypertension   . Hyperlipidemia   . Gout   . Osteoarthritis   . Symptomatic cholelithiasis   . Left bundle branch block   . IBS (irritable bowel syndrome)   . Cardiomyopathy     mild nonischemic  . Allergic rhinitis   . Crohn's disease ([place]) 10/13/2013  . Chronic systolic congestive heart failure ([place]) 01/11/2009    Qualifier: Diagnosis of  By: Silvio Pate MD, [facility]     Past Surgical History  Procedure Laterality Date  . Dilation and curettage of uterus    . Vaginal hysterectomy      Social History  reports that she has quit smoking. She has never used smokeless tobacco. She reports that she does not drink alcohol or use illicit drugs.  Family History family history includes Hypertension in her father.       Review of Systems  Constitutional: Negative.   Respiratory: Negative.   Cardiovascular: Negative.   Gastrointestinal:  Negative.   Musculoskeletal: Negative.   Neurological: Negative.   Hematological: Negative.   Psychiatric/Behavioral: Negative.   All other systems reviewed and are negative.   BP 140/80 mmHg  Pulse 125  Ht 5' 3"  (1.6 m)  Wt 148 lb 12 oz (67.473 kg)  BMI 26.36 kg/m2   Physical Exam  Constitutional: She is oriented to person, place, and time. She appears well-developed and well-nourished.  HENT:  Head: Normocephalic.  Nose: Nose normal.  Mouth/Throat: Oropharynx is clear and moist.  Eyes: Conjunctivae are normal. Pupils are equal, round, and reactive to light.  Neck: Normal range of  motion. Neck supple. No JVD present.  Cardiovascular: Regular rhythm, S1 normal, S2 normal, normal heart sounds and intact distal pulses.  Tachycardia present.  Exam reveals no gallop and no friction rub.   No murmur heard. Pulmonary/Chest: Effort normal and breath sounds normal. No respiratory distress. She has no wheezes. She has no rales. She exhibits no tenderness.  Abdominal: Soft. Bowel sounds are normal. She exhibits no distension. There is no tenderness.  Musculoskeletal: Normal range of motion. She exhibits no edema or tenderness.  Lymphadenopathy:    She has no cervical adenopathy.  Neurological: She is alert and oriented to person, place, and time. Coordination normal.  Skin: Skin is warm and dry. No rash noted. No erythema.  Psychiatric: She has a normal mood and affect. Her behavior is normal. Judgment and thought content normal.    Assessment and Plan  Nursing note and vitals reviewed.

## 2015-09-20 NOTE — Assessment & Plan Note (Signed)
Managed by endocrine Hemoglobin A1c greater than 11

## 2015-09-21 ENCOUNTER — Telehealth: Payer: Self-pay

## 2015-09-21 NOTE — Telephone Encounter (Signed)
Rosuvastatin 55m approved by [employer], good through 09/20/2016.

## 2015-09-21 NOTE — Telephone Encounter (Signed)
Prior auth for Rosuvastatin 29m sent to [facility].

## 2015-10-11 LAB — HM DIABETES EYE EXAM

## 2015-10-29 ENCOUNTER — Encounter: Payer: Self-pay | Admitting: Internal Medicine

## 2015-11-04 ENCOUNTER — Other Ambulatory Visit: Payer: Self-pay

## 2015-11-04 NOTE — Telephone Encounter (Signed)
Okay to refill for 1 month but she absolutely needs to schedule another appointment within the next month, as she has been lost for follow-up.

## 2015-11-04 NOTE — Telephone Encounter (Signed)
[facility] sent a rf rq for Humulin. LOV was 12/15. Pended for your review. pls advise.

## 2015-11-05 MED ORDER — INSULIN REGULAR HUMAN (CONC) 500 UNIT/ML ~~LOC~~ SOLN: 110.0000 [IU] | Freq: Three times a day (TID) | SUBCUTANEOUS | Status: DC

## 2015-11-05 NOTE — Telephone Encounter (Signed)
Sent 1 month request. Called pt no answer LMOM need to make appt next month pls call to schedule f/u///lmb

## 2015-11-15 ENCOUNTER — Other Ambulatory Visit: Payer: Self-pay | Admitting: Internal Medicine

## 2015-12-07 ENCOUNTER — Other Ambulatory Visit: Payer: Self-pay | Admitting: Cardiovascular Disease

## 2016-01-10 ENCOUNTER — Ambulatory Visit (INDEPENDENT_AMBULATORY_CARE_PROVIDER_SITE_OTHER): Payer: BLUE CROSS/BLUE SHIELD | Admitting: Family Medicine

## 2016-01-10 ENCOUNTER — Encounter: Payer: Self-pay | Admitting: Family Medicine

## 2016-01-10 VITALS — BP 150/72 | HR 112 | Temp 97.4°F | Ht 63.0 in | Wt 151.2 lb

## 2016-01-10 DIAGNOSIS — I5022 Chronic systolic (congestive) heart failure: Secondary | ICD-10-CM | POA: Diagnosis not present

## 2016-01-10 DIAGNOSIS — E785 Hyperlipidemia, unspecified: Secondary | ICD-10-CM

## 2016-01-10 DIAGNOSIS — I10 Essential (primary) hypertension: Secondary | ICD-10-CM

## 2016-01-10 DIAGNOSIS — E1159 Type 2 diabetes mellitus with other circulatory complications: Secondary | ICD-10-CM | POA: Diagnosis not present

## 2016-01-10 MED ORDER — "INSULIN SYRINGE-NEEDLE U-100 31G X 5/16"" 0.5 ML MISC": Status: DC

## 2016-01-10 MED ORDER — INSULIN REGULAR HUMAN (CONC) 500 UNIT/ML ~~LOC~~ SOLN: 120.0000 [IU] | Freq: Three times a day (TID) | SUBCUTANEOUS | Status: DC

## 2016-01-10 MED ORDER — TRAMADOL HCL 50 MG PO TABS: 50.0000 mg | ORAL_TABLET | Freq: Four times a day (QID) | ORAL | Status: DC | PRN

## 2016-01-10 MED ORDER — FLUOXETINE HCL 20 MG PO CAPS: 20.0000 mg | ORAL_CAPSULE | Freq: Every day | ORAL | Status: DC

## 2016-01-10 NOTE — Progress Notes (Signed)
Pre visit review using our clinic review tool, if applicable. No additional management support is needed unless otherwise documented below in the visit note. 

## 2016-01-10 NOTE — Progress Notes (Signed)
Dr. [name] T. Jermey Closs, MD, [facility] [place], [address] Phone: [phone] Fax: [phone]  01/10/2016  Patient: Kristy [name], MRN: [id number], DOB: Aug 23, 1951, 65 y.o.  Primary Physician:  Owens Loffler, MD   Chief Complaint  Patient presents with  . Follow-up    6 month   Subjective:   Kristy [name] is a 65 y.o. very pleasant female patient who presents with the following:  Immunization History  Administered Date(s) Administered  . Td 10/02/1993, 01/06/2008    Pneumovax - declines  Diabetes Mellitus: Tolerating Medications: yes Compliance with diet: fair Exercise: minimal / intermittent Avg blood sugars at home: 400-600 Foot problems: none Hypoglycemia: none No nausea, vomitting, blurred vision, polyuria. + chronic yeast   Lab Results  Component Value Date   HGBA1C 11.6* 07/27/2014   HGBA1C 10.9* 04/23/2014   HGBA1C 14.0* 11/26/2013   Lab Results  Component Value Date   MICROALBUR 1.5 05/11/2009   LDLCALC 75 04/26/2009   CREATININE 0.72 07/27/2014    Wt Readings from Last 3 Encounters:  01/10/16 151 lb 4 oz (68.607 kg)  09/20/15 148 lb 12 oz (67.473 kg)  03/11/15 153 lb (69.4 kg)    Body mass index is 26.8 kg/(m^2).   HTN: Tolerating all medications without side effects Stable and at goal No CP, no sob. No HA.  BP Readings from Last 3 Encounters:  01/10/16 150/72  09/20/15 140/80  03/11/15 097/35    Basic Metabolic Panel:    Component Value Date/Time   NA 140 07/27/2014 0902   K 4.2 07/27/2014 0902   CL 102 07/27/2014 0902   CO2 28 07/27/2014 0902   BUN 10 07/27/2014 0902   CREATININE 0.72 07/27/2014 0902   CREATININE 1.2 10/24/2010 1644   GLUCOSE 161* 07/27/2014 0902   CALCIUM 9.5 07/27/2014 3299   Chronic systolic congestive heart failure, she is compliant with taking all of her cardiac medications.  She is not seen her endocrinologist in quite some time,  and much of this is due to financial hardship.  She is currently taking 120 units of regular insulin 3 times daily.  Past Medical History, Surgical History, Social History, Family History, Problem List, Medications, and Allergies have been reviewed and updated if relevant.  Patient Active Problem List   Diagnosis Date Noted  . Sinus tachycardia ([place]) 09/20/2015  . Primary osteoarthritis involving multiple joints 03/14/2015  . Crohn's disease ([place]) 10/13/2013  . Major depressive disorder, recurrent, in full remission ([place]) 05/01/2011  . TRANSAMINASES, SERUM, ELEVATED 05/01/2010  . PSORIASIS 08/02/2009  . Hyperlipidemia 05/11/2009  . IRRITABLE BOWEL SYNDROME 03/10/2009  . GOUT 02/05/2009  . Chronic systolic congestive heart failure ([place]) 01/11/2009  . Type 2 diabetes mellitus with vascular disease ([place])   . Essential hypertension 04/18/2007  . LEFT BUNDLE BRANCH BLOCK 04/18/2007  . ALLERGIC RHINITIS 04/18/2007  . GALLSTONES 04/18/2007  . DIVERTICULITIS, HX OF 04/18/2007    Past Medical History  Diagnosis Date  . Diabetes mellitus   . Hypertension   . Hyperlipidemia   . Gout   . Osteoarthritis   . Symptomatic cholelithiasis   . Left bundle branch block   . IBS (irritable bowel syndrome)   . Cardiomyopathy     mild nonischemic  . Allergic rhinitis   . Crohn's disease ([place]) 10/13/2013  . Chronic systolic congestive heart failure ([place]) 01/11/2009    Qualifier: Diagnosis of  By: Silvio Pate MD, [facility]  Past Surgical History  Procedure Laterality Date  . Dilation and curettage of uterus    . Vaginal hysterectomy      Social History   Social History  . Marital Status: Married    Spouse Name: N/A  . Number of Children: N/A  . Years of Education: N/A   Occupational History  . Not on file.   Social History Main Topics  . Smoking status: Former Research scientist (life sciences)  . Smokeless tobacco: Never Used  . Alcohol Use: No  . Drug Use: No  . Sexual Activity: Not on file   Other  Topics Concern  . Not on file   Social History Narrative    Family History  Problem Relation Age of Onset  . Hypertension Father     Allergies  Allergen Reactions  . Fish Oil Other (See Comments)    Gout  . Glimepiride     REACTION: hypoglycemia  . Guanfacine Hcl     REACTION: unspecified  . Rosiglitazone Other (See Comments)  . Rosiglitazone Maleate     REACTION: unspecified    Medication list reviewed and updated in full in [facility].   GEN: No acute illnesses, no fevers, chills. GI: No n/v/d, eating normally Pulm: No SOB Interactive and getting along well at home.  Otherwise, ROS is as per the HPI.  Objective:   BP 150/72 mmHg  Pulse 112  Temp(Src) 97.4 F (36.3 C) (Oral)  Ht 5' 3"  (1.6 m)  Wt 151 lb 4 oz (68.607 kg)  BMI 26.80 kg/m2  GEN: WDWN, NAD, Non-toxic, A & O x 3 HEENT: Atraumatic, Normocephalic. Neck supple. No masses, No LAD. Ears and Nose: No external deformity. CV: RRR, No M/G/R. No JVD. No thrill. No extra heart sounds. PULM: CTA B, no wheezes, crackles, rhonchi. No retractions. No resp. distress. No accessory muscle use. EXTR: No c/c/e NEURO Normal gait.  PSYCH: Normally interactive. Conversant. Not depressed or anxious appearing.  Calm demeanor.   Laboratory and Imaging Data:  Assessment and Plan:   Type 2 diabetes mellitus with vascular disease ([place]) - Plan: Basic metabolic panel, CBC with Differential/Platelet, Hepatic function panel, Hemoglobin A1c, Microalbumin / creatinine urine ratio, Lipid panel  Chronic systolic congestive heart failure ([facility]) - Plan: Basic metabolic panel, CBC with Differential/Platelet, Hepatic function panel  Essential hypertension  Hyperlipidemia   Her diabetes is wildly out of control.  She is honestly on more insulin than anyone that I have ever taking care of.  She promises me that she will see her endocrinologist and at least 6 months, when she will have a change in her insurance status and be  able to go on Medicare.  Compliant with her congestive heart failure medications.  She is asymptomatic currently.  She declines all vaccines today.  Follow-up: 6 months  New Prescriptions   No medications on file   Modified Medications   Modified Medication Previous Medication   FLUOXETINE (PROZAC) 20 MG CAPSULE FLUoxetine (PROZAC) 20 MG capsule      Take 1 capsule (20 mg total) by mouth daily.    TAKE ONE CAPSULE DAILY   INSULIN REGULAR HUMAN CONCENTRATED (HUMULIN R) 500 UNIT/ML INJECTION insulin regular human CONCENTRATED (HUMULIN R) 500 UNIT/ML injection      Inject 0.24 mLs (120 Units total) into the skin 3 (three) times daily with meals.    Inject 0.22 mLs (110 Units total) into the skin 3 (three) times daily with meals. Must keep appt for next month   INSULIN SYRINGE-NEEDLE  U-100 (B-D INS SYRINGE 0.5CC/31GX5/16) 31G X 5/16" 0.5 ML MISC Insulin Syringe-Needle U-100 (B-D INS SYRINGE 0.5CC/31GX5/16) 31G X 5/16" 0.5 ML MISC      USE AS DIRECTED THREE TIMES DAILY.    USE AS DIRECTED THREE TIMES DAILY. **LAST REFILL**PT NEEDS APPT**   TRAMADOL (ULTRAM) 50 MG TABLET traMADol (ULTRAM) 50 MG tablet      Take 1 tablet (50 mg total) by mouth every 6 (six) hours as needed.    Take 1 tablet (50 mg total) by mouth every 6 (six) hours as needed.   Orders Placed This Encounter  Procedures  . Basic metabolic panel  . CBC with Differential/Platelet  . Hepatic function panel  . Hemoglobin A1c  . Microalbumin / creatinine urine ratio  . Lipid panel    Signed,  [name] T. Brittain Smithey, MD   Patient's Medications  New Prescriptions   No medications on file  Previous Medications   CARVEDILOL (COREG) 25 MG TABLET    Take 1.5 tablets (37.5 mg total) by mouth 2 (two) times daily.   LISINOPRIL (PRINIVIL,ZESTRIL) 40 MG TABLET    TAKE 1 TABLET EVERY DAY   METFORMIN (GLUCOPHAGE-XR) 500 MG 24 HR TABLET    Take 500 mg by mouth 3 (three) times daily.   PROBIOTIC PRODUCT (ALIGN) 4 MG CAPS    Take 1  capsule by mouth daily.    ROSUVASTATIN (CRESTOR) 5 MG TABLET    Take 1 tablet (5 mg total) by mouth daily.  Modified Medications   Modified Medication Previous Medication   FLUOXETINE (PROZAC) 20 MG CAPSULE FLUoxetine (PROZAC) 20 MG capsule      Take 1 capsule (20 mg total) by mouth daily.    TAKE ONE CAPSULE DAILY   INSULIN REGULAR HUMAN CONCENTRATED (HUMULIN R) 500 UNIT/ML INJECTION insulin regular human CONCENTRATED (HUMULIN R) 500 UNIT/ML injection      Inject 0.24 mLs (120 Units total) into the skin 3 (three) times daily with meals.    Inject 0.22 mLs (110 Units total) into the skin 3 (three) times daily with meals. Must keep appt for next month   INSULIN SYRINGE-NEEDLE U-100 (B-D INS SYRINGE 0.5CC/31GX5/16) 31G X 5/16" 0.5 ML MISC Insulin Syringe-Needle U-100 (B-D INS SYRINGE 0.5CC/31GX5/16) 31G X 5/16" 0.5 ML MISC      USE AS DIRECTED THREE TIMES DAILY.    USE AS DIRECTED THREE TIMES DAILY. **LAST REFILL**PT NEEDS APPT**   TRAMADOL (ULTRAM) 50 MG TABLET traMADol (ULTRAM) 50 MG tablet      Take 1 tablet (50 mg total) by mouth every 6 (six) hours as needed.    Take 1 tablet (50 mg total) by mouth every 6 (six) hours as needed.  Discontinued Medications   No medications on file

## 2016-01-18 ENCOUNTER — Other Ambulatory Visit: Payer: BLUE CROSS/BLUE SHIELD

## 2016-01-31 ENCOUNTER — Other Ambulatory Visit: Payer: Self-pay | Admitting: Family Medicine

## 2016-02-03 ENCOUNTER — Other Ambulatory Visit (INDEPENDENT_AMBULATORY_CARE_PROVIDER_SITE_OTHER): Payer: BLUE CROSS/BLUE SHIELD

## 2016-02-03 DIAGNOSIS — I5022 Chronic systolic (congestive) heart failure: Secondary | ICD-10-CM | POA: Diagnosis not present

## 2016-02-03 DIAGNOSIS — E1159 Type 2 diabetes mellitus with other circulatory complications: Secondary | ICD-10-CM | POA: Diagnosis not present

## 2016-02-03 LAB — LIPID PANEL
Cholesterol: 245 mg/dL — ABNORMAL HIGH (ref 0–200)
HDL: 35.4 mg/dL — AB (ref >39.00)
NONHDL: 210.08
TRIGLYCERIDES: 210 mg/dL — AB (ref 0.0–149.0)
Total CHOL/HDL Ratio: 7
VLDL: 42 mg/dL — ABNORMAL HIGH (ref 0.0–40.0)

## 2016-02-03 LAB — BASIC METABOLIC PANEL
BUN: 11 mg/dL (ref 6–23)
CALCIUM: 9.4 mg/dL (ref 8.4–10.5)
CHLORIDE: 102 meq/L (ref 96–112)
CO2: 29 mEq/L (ref 19–32)
CREATININE: 0.77 mg/dL (ref 0.40–1.20)
GFR: 80.05 mL/min (ref >60.00)
Glucose, Bld: 249 mg/dL — ABNORMAL HIGH (ref 70–99)
Potassium: 3.9 mEq/L (ref 3.5–5.1)
Sodium: 139 mEq/L (ref 135–145)

## 2016-02-03 LAB — CBC WITH DIFFERENTIAL/PLATELET
BASOS ABS: 0 10*3/uL (ref 0.0–0.1)
Basophils Relative: 0.4 % (ref 0.0–3.0)
EOS ABS: 0.2 10*3/uL (ref 0.0–0.7)
Eosinophils Relative: 2.5 % (ref 0.0–5.0)
HEMATOCRIT: 41.8 % (ref 36.0–46.0)
HEMOGLOBIN: 13.9 g/dL (ref 12.0–15.0)
LYMPHS PCT: 37.7 % (ref 12.0–46.0)
Lymphs Abs: 3.4 10*3/uL (ref 0.7–4.0)
MCHC: 33.2 g/dL (ref 30.0–36.0)
MCV: 88.6 fl (ref 78.0–100.0)
Monocytes Absolute: 0.9 10*3/uL (ref 0.1–1.0)
Monocytes Relative: 9.4 % (ref 3.0–12.0)
Neutro Abs: 4.6 10*3/uL (ref 1.4–7.7)
Neutrophils Relative %: 50 % (ref 43.0–77.0)
Platelets: 181 10*3/uL (ref 150.0–400.0)
RBC: 4.71 Mil/uL (ref 3.87–5.11)
RDW: 13.4 % (ref 11.5–15.5)
WBC: 9.1 10*3/uL (ref 4.0–10.5)

## 2016-02-03 LAB — LDL CHOLESTEROL, DIRECT: Direct LDL: 170 mg/dL

## 2016-02-03 LAB — HEPATIC FUNCTION PANEL
ALK PHOS: 117 U/L (ref 39–117)
ALT: 40 U/L — AB (ref 0–35)
AST: 45 U/L — ABNORMAL HIGH (ref 0–37)
Albumin: 3.9 g/dL (ref 3.5–5.2)
BILIRUBIN DIRECT: 0.1 mg/dL (ref 0.0–0.3)
TOTAL PROTEIN: 6.7 g/dL (ref 6.0–8.3)
Total Bilirubin: 0.5 mg/dL (ref 0.2–1.2)

## 2016-02-03 LAB — HEMOGLOBIN A1C: Hgb A1c MFr Bld: 14 % — ABNORMAL HIGH (ref 4.6–6.5)

## 2016-02-03 LAB — MICROALBUMIN / CREATININE URINE RATIO
Creatinine,U: 173.6 mg/dL
Microalb Creat Ratio: 28.2 mg/g (ref 0.0–30.0)
Microalb, Ur: 49 mg/dL — ABNORMAL HIGH (ref 0.0–1.9)

## 2016-03-03 ENCOUNTER — Ambulatory Visit: Payer: BLUE CROSS/BLUE SHIELD | Admitting: Cardiovascular Disease

## 2016-03-17 ENCOUNTER — Ambulatory Visit (INDEPENDENT_AMBULATORY_CARE_PROVIDER_SITE_OTHER): Payer: BLUE CROSS/BLUE SHIELD | Admitting: Cardiovascular Disease

## 2016-03-17 ENCOUNTER — Encounter: Payer: Self-pay | Admitting: Cardiovascular Disease

## 2016-03-17 VITALS — BP 150/80 | HR 97 | Ht 63.0 in | Wt 153.8 lb

## 2016-03-17 DIAGNOSIS — I5022 Chronic systolic (congestive) heart failure: Secondary | ICD-10-CM

## 2016-03-17 DIAGNOSIS — I10 Essential (primary) hypertension: Secondary | ICD-10-CM | POA: Diagnosis not present

## 2016-03-17 DIAGNOSIS — I447 Left bundle-branch block, unspecified: Secondary | ICD-10-CM

## 2016-03-17 DIAGNOSIS — E1159 Type 2 diabetes mellitus with other circulatory complications: Secondary | ICD-10-CM

## 2016-03-17 DIAGNOSIS — E785 Hyperlipidemia, unspecified: Secondary | ICD-10-CM

## 2016-03-17 MED ORDER — AMLODIPINE BESYLATE 10 MG PO TABS: 10.0000 mg | ORAL_TABLET | Freq: Every day | ORAL | Status: DC

## 2016-03-17 NOTE — Progress Notes (Signed)
Patient ID: Kristy [name], female   DOB: 1950-10-05, 66 y.o.   MRN: [id number] Cardiology Office Note  Date:  03/17/2016   ID:  Kristy [name] 11/22/1950, MRN [id number]  PCP:  Owens Loffler, MD   Chief Complaint  Patient presents with  . other    C/o chest pain and CHF. Meds reviewed verbally.    HPI:  65 yo with HTN, diabetes, and mild nonischemic CM, history of GI disease/IBS, high cholesterol, remote h/o smoking, presents for followup of her hyperlipidemia, coronary artery disease Previous cardiac catheterization in 2009 showed mild luminal irregularities. She does have underlying Crohn's disease. Normal EF in 07/2009   in follow-up today,Hemoglobin A1c of 14, total cholesterol 250 range She reports that she is walking 5 miles per day (at work?) Scheduled to retire August 2017 Stress at home, husband needs a lung transplant for fibrosis She reports that none of her diabetes medications work, on "300 units of insulin, does not work" Blood pressure has been running high, had difficulty tolerating higher dose carvedilol Tolerating lisinopril and carvedilol 12.5 mg twice a day Occasionally bothered by tachycardia  EKG on today's visit shows normal sinus rhythm with rate 97 bpm, left bundle branch block  Other past medical history reviewed Last echo in 10/10 showed EF 50-55% with dysynnergy due to left bundle branch block.    PMH:   has a past medical history of Diabetes mellitus; Hypertension; Hyperlipidemia; Gout; Osteoarthritis; Symptomatic cholelithiasis; Left bundle branch block; IBS (irritable bowel syndrome); Cardiomyopathy; Allergic rhinitis; Crohn's disease ([place]) (10/13/2013); and Chronic systolic congestive heart failure ([place]) (01/11/2009).  PSH:    Past Surgical History  Procedure Laterality Date  . Dilation and curettage of uterus    . Vaginal hysterectomy    . Skin surgery      nose     Current Outpatient Prescriptions  Medication Sig Dispense Refill   . carvedilol (COREG) 25 MG tablet Take 1.5 tablets (37.5 mg total) by mouth 2 (two) times daily. (Patient taking differently: Take 12.5 mg by mouth 2 (two) times daily. ) 90 tablet 11  . FLUoxetine (PROZAC) 20 MG capsule Take 1 capsule (20 mg total) by mouth daily. 30 capsule 5  . insulin regular human CONCENTRATED (HUMULIN R) 500 UNIT/ML injection Inject 0.24 mLs (120 Units total) into the skin 3 (three) times daily with meals. 20 mL 5  . Insulin Syringe-Needle U-100 (B-D INS SYRINGE 0.5CC/31GX5/16) 31G X 5/16" 0.5 ML MISC USE AS DIRECTED THREE TIMES DAILY. 100 each 5  . lisinopril (PRINIVIL,ZESTRIL) 40 MG tablet TAKE 1 TABLET EVERY DAY 30 tablet 6  . metFORMIN (GLUCOPHAGE-XR) 500 MG 24 hr tablet Take 500 mg by mouth 3 (three) times daily.    . ONE TOUCH ULTRA TEST test strip USE AS PER PACKAGE INSTRUCTIONS CHECK SUGARS UP TO-FOUR TIMES A DAY OR AS PRESCRIBED 150 each 11  . Probiotic Product (ALIGN) 4 MG CAPS Take 1 capsule by mouth daily.     . traMADol (ULTRAM) 50 MG tablet Take 1 tablet (50 mg total) by mouth every 6 (six) hours as needed. 90 tablet 3   No current facility-administered medications for this visit.     Allergies:   Fish oil; Glimepiride; Guanfacine hcl; Rosiglitazone; and Rosiglitazone maleate   Social History:  The patient  reports that she has quit smoking. She has never used smokeless tobacco. She reports that she does not drink alcohol or use illicit drugs.   Family History:   family history includes  Hypertension in her father.    Review of Systems: ROS   PHYSICAL EXAM: VS:  Ht 5' 3"  (1.6 m)  Wt 153 lb 12 oz (69.741 kg)  BMI 27.24 kg/m2 , BMI Body mass index is 27.24 kg/(m^2). GEN: Well nourished, well developed, in no acute distress HEENT: normal Neck: no JVD, carotid bruits, or masses Cardiac: RRR; no murmurs, rubs, or gallops,no edema  Respiratory:  clear to auscultation bilaterally, normal work of breathing GI: soft, nontender, nondistended, + BS MS: no  deformity or atrophy Skin: warm and dry, no rash Neuro:  Strength and sensation are intact Psych: euthymic mood, full affect    Recent Labs: 02/03/2016: ALT 40*; BUN 11; Creatinine, Ser 0.77; Hemoglobin 13.9; Platelets 181.0; Potassium 3.9; Sodium 139    Lipid Panel Lab Results  Component Value Date   CHOL 245* 02/03/2016   HDL 35.40* 02/03/2016   LDLCALC 75 04/26/2009   TRIG 210.0* 02/03/2016      Wt Readings from Last 3 Encounters:  03/17/16 153 lb 12 oz (69.741 kg)  01/10/16 151 lb 4 oz (68.607 kg)  09/20/15 148 lb 12 oz (67.473 kg)       ASSESSMENT AND PLAN:  Chronic systolic congestive heart failure ([place]) - Plan: EKG 12-Lead Normal ejection fraction in 2010, encouraged her to stay on carvedilol and lisinopril  Essential hypertension - Plan: EKG 12-Lead Started amlodipine 5 mill grams daily Encouraged her to monitor blood pressure and increase up to 10 mg daily if systolic pressure runs consistently greater than 135  LBBB (left bundle branch block) - Plan: EKG 12-Lead Discussed the bundle branch block with her and mechanism No indication for pacemaker  Hyperlipidemia She does not want a statin Numbers should improve with diabetes numbers drop  Type 2 diabetes mellitus with vascular disease ([place]) Reports that none of the insulin or medications work for her Considering workup at [facility] once she retires   Total encounter time more than 15 minutes  Greater than 50% was spent in counseling and coordination of care with the patient   Disposition:   F/U  6 months   Orders Placed This Encounter  Procedures  . EKG 12-Lead     Signed, Esmond Plants, M.D., Ph.D. 03/17/2016  [place], [place]

## 2016-03-17 NOTE — Patient Instructions (Signed)
You are doing well.  Please start amlodipine 1/2 pill once a day If blood pressure stays high,  Increase up to a full pill  Please call us if you have new issues that need to be addressed before your next appt.  Your physician wants you to follow-up in: 6 months.  You will receive a reminder letter in the mail two months in advance. If you don't receive a letter, please call our office to schedule the follow-up appointment.

## 2016-05-18 ENCOUNTER — Other Ambulatory Visit: Payer: Self-pay | Admitting: Family Medicine

## 2016-07-12 ENCOUNTER — Encounter: Payer: Self-pay | Admitting: Family Medicine

## 2016-07-12 ENCOUNTER — Ambulatory Visit (INDEPENDENT_AMBULATORY_CARE_PROVIDER_SITE_OTHER): Payer: Medicare HMO | Admitting: Family Medicine

## 2016-07-12 VITALS — BP 140/70 | HR 113 | Temp 98.7°F | Ht 63.0 in | Wt 151.5 lb

## 2016-07-12 DIAGNOSIS — E1159 Type 2 diabetes mellitus with other circulatory complications: Secondary | ICD-10-CM | POA: Diagnosis not present

## 2016-07-12 DIAGNOSIS — I5022 Chronic systolic (congestive) heart failure: Secondary | ICD-10-CM

## 2016-07-12 DIAGNOSIS — I10 Essential (primary) hypertension: Secondary | ICD-10-CM

## 2016-07-12 DIAGNOSIS — E7849 Other hyperlipidemia: Secondary | ICD-10-CM

## 2016-07-12 DIAGNOSIS — E784 Other hyperlipidemia: Secondary | ICD-10-CM | POA: Diagnosis not present

## 2016-07-12 LAB — HEMOGLOBIN A1C: Hgb A1c MFr Bld: 14.5 % — ABNORMAL HIGH (ref 4.6–6.5)

## 2016-07-12 MED ORDER — FLUOXETINE HCL 20 MG PO CAPS: 20.0000 mg | ORAL_CAPSULE | Freq: Every day | ORAL | 3 refills | Status: DC

## 2016-07-12 MED ORDER — INSULIN REGULAR HUMAN (CONC) 500 UNIT/ML ~~LOC~~ SOLN: 120.0000 [IU] | Freq: Three times a day (TID) | SUBCUTANEOUS | 2 refills | Status: DC

## 2016-07-12 MED ORDER — LISINOPRIL 40 MG PO TABS
40.0000 mg | ORAL_TABLET | Freq: Every day | ORAL | 3 refills | Status: DC
Start: 2016-07-12 — End: 2017-11-02

## 2016-07-12 NOTE — Progress Notes (Signed)
Dr. [name] T. Lennyn Bellanca, MD, [facility] [place], [address] Phone: [phone] Fax: [phone]  07/12/2016  Patient: Kristy [name], MRN: [id number], DOB: Apr 07, 1951, 65 y.o.  Primary Physician:  Owens Loffler, MD   Chief Complaint  Patient presents with  . Diabetes   Subjective:   Kristy [name] is a 65 y.o. very pleasant female patient who presents with the following:  Retired 13-Jun-2023.  Husband died.   Taking Claritin.   BS 274 this morning.   Diabetes Mellitus: Tolerating Medications: yes Compliance with diet: fair / ok Exercise: minimal / intermittent Avg blood sugars at home: QID, 150's - 400's Foot problems: occ tingling Hypoglycemia: none No nausea, vomitting, blurred vision, polyuria.  Had financial issues and could not go to endocrine for the last couple of years  Lab Results  Component Value Date   HGBA1C 14.0 (H) 02/03/2016   HGBA1C 11.6 (H) 07/27/2014   HGBA1C 10.9 (H) 04/23/2014   Lab Results  Component Value Date   MICROALBUR 49.0 (H) 02/03/2016   LDLCALC 75 04/26/2009   CREATININE 0.77 02/03/2016    Wt Readings from Last 3 Encounters:  07/12/16 151 lb 8 oz (68.7 kg)  03/17/16 153 lb 12 oz (69.7 kg)  01/10/16 151 lb 4 oz (68.6 kg)    Body mass index is 26.84 kg/m.   Lipids: Not taking statins Panel reviewed with patient.  Lipids:    Component Value Date/Time   CHOL 245 (H) 02/03/2016 0849   TRIG 210.0 (H) 02/03/2016 0849   HDL 35.40 (L) 02/03/2016 0849   LDLDIRECT 170.0 02/03/2016 0849   VLDL 42.0 (H) 02/03/2016 0849   CHOLHDL 7 02/03/2016 0849    Lab Results  Component Value Date   ALT 40 (H) 02/03/2016   AST 45 (H) 02/03/2016   ALKPHOS 117 02/03/2016   BILITOT 0.5 02/03/2016    HTN: Tolerating all medications without side effects Stable and at goal No CP, no sob. No HA.  BP Readings from Last 3 Encounters:  07/12/16 140/70  03/17/16 (!) 150/80    01/10/16 (!) 956/38    Basic Metabolic Panel:    Component Value Date/Time   NA 139 02/03/2016 0849   K 3.9 02/03/2016 0849   CL 102 02/03/2016 0849   CO2 29 02/03/2016 0849   BUN 11 02/03/2016 0849   CREATININE 0.77 02/03/2016 0849   CREATININE 0.72 07/27/2014 0902   GLUCOSE 249 (H) 02/03/2016 0849   CALCIUM 9.4 02/03/2016 0849     Past Medical History, Surgical History, Social History, Family History, Problem List, Medications, and Allergies have been reviewed and updated if relevant.  Patient Active Problem List   Diagnosis Date Noted  . Sinus tachycardia 09/20/2015  . Primary osteoarthritis involving multiple joints 03/14/2015  . Crohn's disease ([place]) 10/13/2013  . Major depressive disorder, recurrent, in full remission ([place]) 05/01/2011  . TRANSAMINASES, SERUM, ELEVATED 05/01/2010  . PSORIASIS 08/02/2009  . Hyperlipidemia 05/11/2009  . IRRITABLE BOWEL SYNDROME 03/10/2009  . GOUT 02/05/2009  . Chronic systolic congestive heart failure ([place]) 01/11/2009  . Type 2 diabetes mellitus with vascular disease ([place])   . Essential hypertension 04/18/2007  . LBBB (left bundle branch block) 04/18/2007  . ALLERGIC RHINITIS 04/18/2007  . GALLSTONES 04/18/2007  . DIVERTICULITIS, HX OF 04/18/2007    Past Medical History:  Diagnosis Date  . Allergic rhinitis   . Cardiomyopathy    mild nonischemic  . Chronic systolic congestive heart  failure ([place]) 01/11/2009   Qualifier: Diagnosis of  By: Silvio Pate MD, [facility]   . Crohn's disease ([place]) 10/13/2013  . Diabetes mellitus   . Gout   . Hyperlipidemia   . Hypertension   . IBS (irritable bowel syndrome)   . Left bundle branch block   . Osteoarthritis   . Symptomatic cholelithiasis     Past Surgical History:  Procedure Laterality Date  . DILATION AND CURETTAGE OF UTERUS    . SKIN SURGERY     nose   . VAGINAL HYSTERECTOMY      Social History   Social History  . Marital status: Married    Spouse name: N/A  . Number of  children: N/A  . Years of education: N/A   Occupational History  . Not on file.   Social History Main Topics  . Smoking status: Former Research scientist (life sciences)  . Smokeless tobacco: Never Used  . Alcohol use No  . Drug use: No  . Sexual activity: Not on file   Other Topics Concern  . Not on file   Social History Narrative  . No narrative on file    Family History  Problem Relation Age of Onset  . Hypertension Father     Allergies  Allergen Reactions  . Fish Oil Other (See Comments)    Gout  . Glimepiride     REACTION: hypoglycemia  . Guanfacine Hcl     REACTION: unspecified  . Rosiglitazone Other (See Comments)  . Rosiglitazone Maleate     REACTION: unspecified    Medication list reviewed and updated in full in [place].   GEN: No acute illnesses, no fevers, chills. GI: No n/v/d, eating normally Pulm: No SOB Interactive and getting along well at home.  Otherwise, ROS is as per the HPI.  Objective:   BP 140/70   Pulse (!) 113   Temp 98.7 F (37.1 C) (Oral)   Ht 5' 3"  (1.6 m)   Wt 151 lb 8 oz (68.7 kg)   BMI 26.84 kg/m   GEN: WDWN, NAD, Non-toxic, A & O x 3 HEENT: Atraumatic, Normocephalic. Neck supple. No masses, No LAD. Ears and Nose: No external deformity. CV: RRR, No M/G/R. No JVD. No thrill. No extra heart sounds. PULM: CTA B, no wheezes, crackles, rhonchi. No retractions. No resp. distress. No accessory muscle use. EXTR: No c/c/e NEURO Normal gait.  PSYCH: Normally interactive. Conversant. Not depressed or anxious appearing.  Calm demeanor.   Laboratory and Imaging Data: Results for orders placed or performed in visit on 07/12/16  HM DIABETES EYE EXAM  Result Value Ref Range   HM Diabetic Eye Exam No Retinopathy No Retinopathy    Lab Results  Component Value Date   HGBA1C 14.0 (H) 02/03/2016    Lab Results  Component Value Date   WBC 9.1 02/03/2016   HGB 13.9 02/03/2016   HCT 41.8 02/03/2016   PLT 181.0 02/03/2016   GLUCOSE 249 (H)  02/03/2016   CHOL 245 (H) 02/03/2016   TRIG 210.0 (H) 02/03/2016   HDL 35.40 (L) 02/03/2016   LDLDIRECT 170.0 02/03/2016   LDLCALC 75 04/26/2009   ALT 40 (H) 02/03/2016   AST 45 (H) 02/03/2016   NA 139 02/03/2016   K 3.9 02/03/2016   CL 102 02/03/2016   CREATININE 0.77 02/03/2016   BUN 11 02/03/2016   CO2 29 02/03/2016   TSH 1.05 09/07/2014   INR 0.9 01/04/2009   HGBA1C 14.0 (H) 02/03/2016   MICROALBUR 49.0 (H)  02/03/2016     Assessment and Plan:   Type 2 diabetes mellitus with vascular disease ([place]) - Plan: Ambulatory referral to Endocrinology, Hemoglobin C1E  Chronic systolic congestive heart failure ([place])  Essential hypertension  Other hyperlipidemia  >25 minutes spent in face to face time with patient, >50% spent in counselling or coordination of care  High doses of insulin - will have her see [name] again for assistance.   Compliant with CHF meds  HTN stable  Lipids - declines statin use  Additional time spent in grief counselling regarding husband's passing.  Follow-up: Return in about 6 months (around 01/10/2017).  New Prescriptions   FLUOXETINE (PROZAC) 20 MG CAPSULE    Take 1 capsule (20 mg total) by mouth daily.   Modified Medications   Modified Medication Previous Medication   INSULIN REGULAR HUMAN CONCENTRATED (HUMULIN R) 500 UNIT/ML INJECTION insulin regular human CONCENTRATED (HUMULIN R) 500 UNIT/ML injection      Inject 0.24 mLs (120 Units total) into the skin 3 (three) times daily with meals.    Inject 0.24 mLs (120 Units total) into the skin 3 (three) times daily with meals.   LISINOPRIL (PRINIVIL,ZESTRIL) 40 MG TABLET lisinopril (PRINIVIL,ZESTRIL) 40 MG tablet      Take 1 tablet (40 mg total) by mouth daily.    TAKE 1 TABLET EVERY DAY   Orders Placed This Encounter  Procedures  . Hemoglobin A1c  . Ambulatory referral to Endocrinology  . HM DIABETES EYE EXAM    Signed,  Milaina Sher T. Jamieson Hetland, MD   Patient's Medications  New  Prescriptions   FLUOXETINE (PROZAC) 20 MG CAPSULE    Take 1 capsule (20 mg total) by mouth daily.  Previous Medications   AMLODIPINE (NORVASC) 10 MG TABLET    Take 1 tablet (10 mg total) by mouth daily.   CARVEDILOL (COREG) 25 MG TABLET    Take 1.5 tablets (37.5 mg total) by mouth 2 (two) times daily.   FLUOXETINE (PROZAC) 20 MG CAPSULE    Take 1 capsule (20 mg total) by mouth daily.   INSULIN SYRINGE-NEEDLE U-100 (B-D INS SYRINGE 0.5CC/31GX5/16) 31G X 5/16" 0.5 ML MISC    USE AS DIRECTED THREE TIMES DAILY.   METFORMIN (GLUCOPHAGE-XR) 500 MG 24 HR TABLET    Take 500 mg by mouth 3 (three) times daily.   ONE TOUCH ULTRA TEST TEST STRIP    USE AS PER PACKAGE INSTRUCTIONS CHECK SUGARS UP TO-FOUR TIMES A DAY OR AS PRESCRIBED   PROBIOTIC PRODUCT (ALIGN) 4 MG CAPS    Take 1 capsule by mouth daily.    TRAMADOL (ULTRAM) 50 MG TABLET    Take 1 tablet (50 mg total) by mouth every 6 (six) hours as needed.  Modified Medications   Modified Medication Previous Medication   INSULIN REGULAR HUMAN CONCENTRATED (HUMULIN R) 500 UNIT/ML INJECTION insulin regular human CONCENTRATED (HUMULIN R) 500 UNIT/ML injection      Inject 0.24 mLs (120 Units total) into the skin 3 (three) times daily with meals.    Inject 0.24 mLs (120 Units total) into the skin 3 (three) times daily with meals.   LISINOPRIL (PRINIVIL,ZESTRIL) 40 MG TABLET lisinopril (PRINIVIL,ZESTRIL) 40 MG tablet      Take 1 tablet (40 mg total) by mouth daily.    TAKE 1 TABLET EVERY DAY  Discontinued Medications   No medications on file

## 2016-07-12 NOTE — Progress Notes (Signed)
Pre visit review using our clinic review tool, if applicable. No additional management support is needed unless otherwise documented below in the visit note. 

## 2016-07-18 ENCOUNTER — Telehealth: Payer: Self-pay | Admitting: Family Medicine

## 2016-07-18 ENCOUNTER — Ambulatory Visit: Payer: Medicare HMO | Admitting: Internal Medicine

## 2016-07-18 NOTE — Telephone Encounter (Signed)
Pt called checking on lab results Pt stated her spouse died and computer is locked up

## 2016-07-18 NOTE — Telephone Encounter (Signed)
Kristy [name] notified that her Hgb A1c came back at 14.5.  Per Dr. Lorelei Pont, keep doing the insulin and make sure she keeps appointment with [name].  She states she sees [name] this Thursday 10/19/2017at 1:00 pm.

## 2016-07-20 ENCOUNTER — Encounter: Payer: Self-pay | Admitting: Internal Medicine

## 2016-07-20 ENCOUNTER — Ambulatory Visit (INDEPENDENT_AMBULATORY_CARE_PROVIDER_SITE_OTHER): Payer: Medicare HMO | Admitting: Internal Medicine

## 2016-07-20 VITALS — BP 138/80 | HR 108 | Ht 63.0 in | Wt 150.0 lb

## 2016-07-20 DIAGNOSIS — E1159 Type 2 diabetes mellitus with other circulatory complications: Secondary | ICD-10-CM

## 2016-07-20 NOTE — Patient Instructions (Addendum)
Please increase U500: - before b'fast: 0.25 mL - before lunch: 0.30 mL  - before dinner: 0.20 mL   If sugars are still high after this increase, increase by 5 units with the meal before then high sugars.  Please call your insurance and ask them about an insulin pump.  Please return in 1.5 months with your sugar log.

## 2016-07-20 NOTE — Progress Notes (Signed)
Patient ID: Kristy [name], female   DOB: 02-Apr-1951, 65 y.o.   MRN: [id number]  HPI: Kristy [name] is a 65 y.o.-year-old female, returning for f/u for DM1 , dx 1998, insulin-dependent since 2005, uncontrolled, with complications (Cardiomyopathy). Last visit almost 2 years ago!  Her husband died of pulm. fibrosis in 27-Jun-2016.  DM2: Last hemoglobin A1c was: Lab Results  Component Value Date   HGBA1C 14.5 (H) 07/12/2016   HGBA1C 14.0 (H) 02/03/2016   HGBA1C 11.6 (H) 07/27/2014  HbA1c: ~11% in 08/2013.  Pt was on a regimen of: - Levemir 45 units 2x a day - Humalog 40-40-40 >> 40-60-60 units with B-L-D - Metformin XR 1000 mg bid  We changed to: - U500 insulin 0.20 mL before each meal. If sugars before meals is in the 100s >> does not take it.  She was on Metformin but taken off 2/2 IBS and Crohn's ds.  She was on Avandia x 2 years, developed heart ds. She was on Lantus but caused her SOB if taken >60 units at a time (not with lower doses!?). She was on Bydureon, which really helped despite being on mealtime insulin, but too expensive. She was on Invokana >> thirst, increased urination Ronny Bacon is paying 400$ a mo for her insulins!  Pt checks her sugars 2-3x a day and they are still high: - am: 155-240 >> 152-288 >> 199-281 >> 142-333 >> 172-260, 310 >> 135-282 >> 300s - 2h after b'fast: n/c >> 311-365 >> 236, 354 >> 214-402 >> 179-338 >> 216-402 >> n/c - before lunch: 77-210 >> 170, 336 >> 201-384 >> 454, 460 >> 380-461 >> 189-457 >> 300s - 2h after lunch: 174-253 (405) > 68-445 >> 121-276 >> 288-398 >> 273-402 >> n/c - before dinner: 76-170-415 >> 176-308 >> 194-429 >> 276-384, 462 >>  181-409 >> 600s - 2h after dinner: 225-470 >> 268-477 >> 169-499 >> 208, 272-472, 497 >> 365-459 >> n/c - bedtime: 300s >> 169, 178 >> n/c >> 354-382 >> 268-408 >> n/c >> 300s + Lows >> CBG in the middle of the night: 40 x1.  Pt's meals FHQ:RFXJ prepared >> does not eat out. - Breakfast: 1  scrambled egg on toast - Lunch: 4 crackers, 1 slice cheese, 1/2 apple - Dinner: chicken + veggies + sometimes a roll - Snacks: 3: yoghurt, 1/2 apple, graham crackers  - Mild CKD, last BUN/creatinine:  Lab Results  Component Value Date   BUN 11 02/03/2016   CREATININE 0.77 02/03/2016  On Lisinopril. - last set of lipids: Lab Results  Component Value Date   CHOL 245 (H) 02/03/2016   HDL 35.40 (L) 02/03/2016   LDLCALC 75 04/26/2009   LDLDIRECT 170.0 02/03/2016   TRIG 210.0 (H) 02/03/2016   CHOLHDL 7 02/03/2016   - last eye exam was in 10/2015 - Dr Katy Fitch. No DR.  - no numbness and tingling in her feet.  She has a history of chronic yeast infections.   HL: Lab Results  Component Value Date   CHOL 245 (H) 02/03/2016   CHOL 272 (H) 07/27/2014   CHOL 170 04/26/2009   Lab Results  Component Value Date   HDL 35.40 (L) 02/03/2016   HDL 37.50 (L) 07/27/2014   HDL 33 (L) 04/26/2009   Lab Results  Component Value Date   LDLCALC 75 04/26/2009   [place] 93 12/29/2008   Lab Results  Component Value Date   TRIG 210.0 (H) 02/03/2016   TRIG 206.0 (H) 07/27/2014   TRIG  310 (H) 04/26/2009   Lab Results  Component Value Date   CHOLHDL 7 02/03/2016   CHOLHDL 7 07/27/2014   CHOLHDL 5.2 Ratio 04/26/2009   Lab Results  Component Value Date   LDLDIRECT 170.0 02/03/2016   LDLDIRECT 195.5 07/27/2014   LDLDIRECT 123.3 04/26/2010   I suggested starting Crestor 5 mg daily >> did not start b/c afraid of mm pain (severe). Tried other statins >> mm pain >> would not want to restart.  She has elevated AST and ALT: Lab Results  Component Value Date   ALT 40 (H) 02/03/2016   AST 45 (H) 02/03/2016   ALKPHOS 117 02/03/2016   BILITOT 0.5 02/03/2016   I reviewed pt's medications, allergies, PMH, social hx, family hx, and changes were documented in the history of present illness. Otherwise, unchanged from my initial visit note.  ROS: Constitutional: no weight gain, no fatigue, no  subjective hyperthermia, no nocturia Eyes:no blurry vision, no xerophthalmia ENT: no sore throat, no nodules palpated in throat, no dysphagia/odynophagia, no hoarseness Cardiovascular: no CP/palpitations/leg swelling, no SOB Respiratory: no cough/SOB/wheezing Gastrointestinal: no N/V/D/C Musculoskeletal: + muscle aches/no joint aches Skin: no rashes Neurological: no tremors/numbness/tingling/dizziness  PE: BP 138/80 (BP Location: Left Arm, Patient Position: Sitting)   Pulse (!) 108   Ht 5' 3"  (1.6 m)   Wt 150 lb (68 kg)   SpO2 96%   BMI 26.57 kg/m  Body mass index is 26.57 kg/m. Wt Readings from Last 3 Encounters:  07/20/16 150 lb (68 kg)  07/12/16 151 lb 8 oz (68.7 kg)  03/17/16 153 lb 12 oz (69.7 kg)   Constitutional: overweight, in NAD Eyes: PERRLA, EOMI, no exophthalmos ENT: moist mucous membranes, no thyromegaly, no cervical lymphadenopathy Cardiovascular: RRR, No MRG Respiratory: CTA B Gastrointestinal: abdomen soft, NT, ND, BS+ Musculoskeletal: no deformities, strength intact in all 4 Skin: moist, warm, no rashes Neurological: no tremor with outstretched hands, DTR normal in all 4  ASSESSMENT: 1. DM2, insulin-dependent, uncontrolled, with complications - High insulin resistance - CMP, with CHF - Dr Rockey Situ Cardiac catheterization in 12/2008: no CAD, EF 45%, +inferior wall hypokinesis. 2D Echo in 11/2008: EF 40%, + inferior wall hypokinesis  Component     Latest Ref Rng 03/24/2014  Glucose     70 - 99 mg/dL 126 (H)  C-Peptide     0.80 - 3.90 ng/mL 1.96  Glutamic Acid Decarb Ab     <=1.0 U/mL <1.0  Pancreatic Islet Cell Antibody     < 5 JDF Units <5  Insulin Antibodies, Human     <0.4 U/mL <0.4  Normal C-peptide in 2012   2. HL   PLAN:  1. Patient with long-standing, uncontrolled diabetes, with high insulin resistance. She returns after a long absence. Sugars are terrible, with a HbA1c of >14%. She is taking U500, but it is not enough >> will increase the  doses, but I advised her to check with her insurance to see if they would cover an insulin pump. - I suggested to:  Patient Instructions  Please increase U500: - before b'fast: 0.25 mL - before lunch: 0.30 mL  - before dinner: 0.20 mL   If sugars are still high after this increase, increase by 5 units with the meal before then high sugars.  Please call your insurance and ask them about an insulin pump.  Please return in 1.5 months with your sugar log.   - continue checking sugars at different times of the day - check 3 times a day,  rotating checks - UTD with eye exam - Return to clinic in 3 mo with sugar log  2. HL - Patient not on a statin - Her most recent LDL was 195.5 - refuses to try statins or Zetia  Philemon Kingdom, MD PhD [facility] Endocrinology

## 2016-07-26 DIAGNOSIS — R69 Illness, unspecified: Secondary | ICD-10-CM | POA: Diagnosis not present

## 2016-09-08 DIAGNOSIS — Z0000 Encounter for general adult medical examination without abnormal findings: Secondary | ICD-10-CM | POA: Diagnosis not present

## 2016-09-08 DIAGNOSIS — R69 Illness, unspecified: Secondary | ICD-10-CM | POA: Diagnosis not present

## 2016-09-08 DIAGNOSIS — I509 Heart failure, unspecified: Secondary | ICD-10-CM | POA: Diagnosis not present

## 2016-09-08 DIAGNOSIS — K219 Gastro-esophageal reflux disease without esophagitis: Secondary | ICD-10-CM | POA: Diagnosis not present

## 2016-09-08 DIAGNOSIS — Z6826 Body mass index (BMI) 26.0-26.9, adult: Secondary | ICD-10-CM | POA: Diagnosis not present

## 2016-09-08 DIAGNOSIS — I110 Hypertensive heart disease with heart failure: Secondary | ICD-10-CM | POA: Diagnosis not present

## 2016-09-08 DIAGNOSIS — Z794 Long term (current) use of insulin: Secondary | ICD-10-CM | POA: Diagnosis not present

## 2016-09-08 DIAGNOSIS — E1142 Type 2 diabetes mellitus with diabetic polyneuropathy: Secondary | ICD-10-CM | POA: Diagnosis not present

## 2016-09-13 ENCOUNTER — Encounter: Payer: Self-pay | Admitting: Cardiovascular Disease

## 2016-09-13 ENCOUNTER — Ambulatory Visit (INDEPENDENT_AMBULATORY_CARE_PROVIDER_SITE_OTHER): Payer: Medicare HMO | Admitting: Cardiovascular Disease

## 2016-09-13 VITALS — BP 130/80 | HR 120 | Ht 63.0 in | Wt 151.8 lb

## 2016-09-13 DIAGNOSIS — E784 Other hyperlipidemia: Secondary | ICD-10-CM

## 2016-09-13 DIAGNOSIS — E1159 Type 2 diabetes mellitus with other circulatory complications: Secondary | ICD-10-CM | POA: Diagnosis not present

## 2016-09-13 DIAGNOSIS — E7849 Other hyperlipidemia: Secondary | ICD-10-CM

## 2016-09-13 DIAGNOSIS — I10 Essential (primary) hypertension: Secondary | ICD-10-CM

## 2016-09-13 DIAGNOSIS — I447 Left bundle-branch block, unspecified: Secondary | ICD-10-CM

## 2016-09-13 DIAGNOSIS — I2510 Atherosclerotic heart disease of native coronary artery without angina pectoris: Secondary | ICD-10-CM | POA: Insufficient documentation

## 2016-09-13 DIAGNOSIS — R000 Tachycardia, unspecified: Secondary | ICD-10-CM

## 2016-09-13 MED ORDER — METOPROLOL SUCCINATE ER 50 MG PO TB24: 50.0000 mg | ORAL_TABLET | Freq: Every day | ORAL | 11 refills | Status: DC

## 2016-09-13 NOTE — Patient Instructions (Addendum)
Medication Instructions:   Please stop the coreg Start metoprolol 1/2 pill daily Ok to increase to a full pill if tolerated  Labwork:  No new labs needed  Testing/Procedures:  No further testing at this time   I recommend watching educational videos on topics of interest to you at:       [email or web address]  Enter code: HEARTCARE    Follow-Up: It was a pleasure seeing you in the office today. Please call us if you have new issues that need to be addressed before your next appt.  [phone]  Your physician wants you to follow-up in: 6 months.  You will receive a reminder letter in the mail two months in advance. If you don't receive a letter, please call our office to schedule the follow-up appointment.  If you need a refill on your cardiac medications before your next appointment, please call your pharmacy.

## 2016-09-13 NOTE — Progress Notes (Signed)
Cardiology Office Note  Date:  09/13/2016   ID:  Kristy, [name] May 28, 1951, MRN [id number]  PCP:  Kristy Loffler, MD   Chief Complaint  Patient presents with  . other    6 month follow up. Pt. c/o chest pain within the past couple of weeks. Meds reviewed by the pt. verbally.     HPI:  65 yo with HTN, diabetes, and mild nonischemic CM, history of GI disease/IBS, high cholesterol, remote h/o smoking, presents for followup of her hyperlipidemia, coronary artery disease Previous cardiac catheterization in 2009 showed mild luminal irregularities. She does have underlying Crohn's disease. Normal EF in 07/2009 She has chronic tachycardia  In follow-up today she reports that she is doing well Husband passed away from complications of severe underlying lung disease, fibrosis several months ago.   Lab work reviewed with her Hemoglobin A1c of 14 . She has been reluctant to go to [facility] for second opinion Denies any complications from her diabetes LDL 170, up from 89 eight years ago  Previously started on carvedilol for tachycardia . Not taking coreg, side effects Has heart rate 120 bpm by her report on a regular basis. Reports she is asymptomatic most of the time Has follow up with endocrine  Sugars 200 in Am, 500 in PM  total cholesterol 250 range Still walking her dog, active   retired August 2017 Previously reported that "none of her diabetes medications work, on "300 units of insulin, does not work"  EKG on today's visit shows sinus tachycardia with rate 120 bpm, left bundle branch block  Other past medical history reviewed Last echo in 10/10 showed EF 50-55% with dysynnergy due to left bundle branch block.    PMH:   has a past medical history of Allergic rhinitis; Cardiomyopathy; Chronic systolic congestive heart failure ([place]) (01/11/2009); Crohn's disease ([place]) (10/13/2013); Diabetes mellitus; Gout; Hyperlipidemia; Hypertension; IBS (irritable bowel syndrome); Left  bundle branch block; Osteoarthritis; and Symptomatic cholelithiasis.  PSH:    Past Surgical History:  Procedure Laterality Date  . DILATION AND CURETTAGE OF UTERUS    . SKIN SURGERY     nose   . VAGINAL HYSTERECTOMY      Current Outpatient Prescriptions  Medication Sig Dispense Refill  . amLODipine (NORVASC) 10 MG tablet Take 1 tablet (10 mg total) by mouth daily. 30 tablet 11  . FLUoxetine (PROZAC) 20 MG capsule Take 1 capsule (20 mg total) by mouth daily. 30 capsule 5  . insulin regular human CONCENTRATED (HUMULIN R) 500 UNIT/ML injection Inject 0.24 mLs (120 Units total) into the skin 3 (three) times daily with meals. 20 mL 2  . Insulin Syringe-Needle U-100 (B-D INS SYRINGE 0.5CC/31GX5/16) 31G X 5/16" 0.5 ML MISC USE AS DIRECTED THREE TIMES DAILY. 100 each 5  . lisinopril (PRINIVIL,ZESTRIL) 40 MG tablet Take 1 tablet (40 mg total) by mouth daily. 90 tablet 3  . metFORMIN (GLUCOPHAGE-XR) 500 MG 24 hr tablet Take 500 mg by mouth 3 (three) times daily.    . ONE TOUCH ULTRA TEST test strip USE AS PER PACKAGE INSTRUCTIONS CHECK SUGARS UP TO-FOUR TIMES A DAY OR AS PRESCRIBED 150 each 5  . Probiotic Product (ALIGN) 4 MG CAPS Take 1 capsule by mouth daily.     . traMADol (ULTRAM) 50 MG tablet Take 1 tablet (50 mg total) by mouth every 6 (six) hours as needed. 90 tablet 3  . metoprolol succinate (TOPROL-XL) 50 MG 24 hr tablet Take 1 tablet (50 mg total) by mouth daily. Take  with or immediately following a meal. 30 tablet 11   No current facility-administered medications for this visit.      Allergies:   Fish oil; Glimepiride; Guanfacine hcl; Rosiglitazone; and Rosiglitazone maleate   Social History:  The patient  reports that she has quit smoking. She has never used smokeless tobacco. She reports that she does not drink alcohol or use drugs.   Family History:   family history includes Hypertension in her father.    Review of Systems: Review of Systems  Constitutional: Negative.    Respiratory: Negative.   Cardiovascular: Negative.   Gastrointestinal: Negative.   Musculoskeletal: Negative.   Neurological: Negative.   Psychiatric/Behavioral: Negative.   All other systems reviewed and are negative.    PHYSICAL EXAM: VS:  BP 130/80 (BP Location: Left Arm, Patient Position: Sitting, Cuff Size: Normal)   Pulse (!) 120   Ht 5' 3"  (1.6 m)   Wt 151 lb 12 oz (68.8 kg)   BMI 26.88 kg/m  , BMI Body mass index is 26.88 kg/m. GEN: Well nourished, well developed, in no acute distress  HEENT: normal  Neck: no JVD, carotid bruits, or masses Cardiac: Regular rhythm, tachycardia,; no murmurs, rubs, or gallops,no edema  Respiratory:  clear to auscultation bilaterally, normal work of breathing GI: soft, nontender, nondistended, + BS MS: no deformity or atrophy  Skin: warm and dry, no rash Neuro:  Strength and sensation are intact Psych: euthymic mood, full affect    Recent Labs: 02/03/2016: ALT 40; BUN 11; Creatinine, Ser 0.77; Hemoglobin 13.9; Platelets 181.0; Potassium 3.9; Sodium 139    Lipid Panel Lab Results  Component Value Date   CHOL 245 (H) 02/03/2016   HDL 35.40 (L) 02/03/2016   LDLCALC 75 04/26/2009   TRIG 210.0 (H) 02/03/2016      Wt Readings from Last 3 Encounters:  09/13/16 151 lb 12 oz (68.8 kg)  07/20/16 150 lb (68 kg)  07/12/16 151 lb 8 oz (68.7 kg)       ASSESSMENT AND PLAN:   Essential hypertension - Plan: EKG 12-Lead Blood pressure doing well, currently not on carvedilol We'll change to metoprolol, continue amlodipine, lisinopril  Sinus tachycardia - Plan: EKG 12-Lead She does not want additional workup at this time She's willing to try alternate beta blocker Prefers once a day pill. We will start metoprolol succinate 25 mg daily with slow titration up to 50 mg daily  LBBB (left bundle branch block) - Plan: EKG 12-Lead  Type 2 diabetes mellitus with vascular disease ([place]) Followed by endocrinology Been reluctant to seek  [facility] opinion at [facility]  Other hyperlipidemia Previously indicated she does not want a statin Recommended improved diabetes control which will help her cholesterol numbers  CAD  mild disease on prior catheterization 2009 Stressed importance of improving her risk factors to minimize progression   Total encounter time more than 25 minutes  Greater than 50% was spent in counseling and coordination of care with the patient  Disposition:   F/U  6 months   Orders Placed This Encounter  Procedures  . EKG 12-Lead     Signed, Esmond Plants, M.D., Ph.D. 09/13/2016  [place], [place]

## 2016-09-15 ENCOUNTER — Ambulatory Visit: Payer: Medicare HMO | Admitting: Internal Medicine

## 2016-09-20 ENCOUNTER — Ambulatory Visit: Payer: Medicare HMO | Admitting: Internal Medicine

## 2016-10-17 DIAGNOSIS — E119 Type 2 diabetes mellitus without complications: Secondary | ICD-10-CM | POA: Diagnosis not present

## 2016-10-17 DIAGNOSIS — H2513 Age-related nuclear cataract, bilateral: Secondary | ICD-10-CM | POA: Diagnosis not present

## 2016-10-17 DIAGNOSIS — H5315 Visual distortions of shape and size: Secondary | ICD-10-CM | POA: Diagnosis not present

## 2016-10-17 LAB — HM DIABETES EYE EXAM

## 2016-10-19 DIAGNOSIS — R69 Illness, unspecified: Secondary | ICD-10-CM | POA: Diagnosis not present

## 2016-10-20 ENCOUNTER — Telehealth: Payer: Self-pay | Admitting: *Deleted

## 2016-10-20 NOTE — Telephone Encounter (Signed)
Received fax from [facility] requesting PA for Humulin N R-500 concentrated insulin.  They are asking that the doctor reviews the formulary list to see if there is another insulin that would work for patient or complete PA.  Formulary list & PA form placed in Dr. Lillie Fragmin in box for review to change Rx for completed PA form.

## 2016-10-23 NOTE — Telephone Encounter (Signed)
PA request faxed to Dr. Arman Filter office at [phone].

## 2016-10-23 NOTE — Telephone Encounter (Signed)
I would ask that this be sent to the patient's endocrinologist who manages her complex diabetes.

## 2016-11-15 ENCOUNTER — Other Ambulatory Visit: Payer: Self-pay

## 2016-11-15 MED ORDER — INSULIN REGULAR HUMAN (CONC) 500 UNIT/ML ~~LOC~~ SOLN
120.0000 [IU] | Freq: Three times a day (TID) | SUBCUTANEOUS | 2 refills | Status: DC
Start: 2016-11-15 — End: 2017-03-05

## 2017-01-05 DIAGNOSIS — R69 Illness, unspecified: Secondary | ICD-10-CM | POA: Diagnosis not present

## 2017-01-10 ENCOUNTER — Encounter: Payer: Self-pay | Admitting: Family Medicine

## 2017-01-10 ENCOUNTER — Ambulatory Visit (INDEPENDENT_AMBULATORY_CARE_PROVIDER_SITE_OTHER): Payer: Medicare HMO | Admitting: Family Medicine

## 2017-01-10 VITALS — BP 124/66 | HR 111 | Temp 98.8°F | Ht 63.0 in | Wt 156.5 lb

## 2017-01-10 DIAGNOSIS — Z79899 Other long term (current) drug therapy: Secondary | ICD-10-CM | POA: Diagnosis not present

## 2017-01-10 DIAGNOSIS — E1159 Type 2 diabetes mellitus with other circulatory complications: Secondary | ICD-10-CM | POA: Diagnosis not present

## 2017-01-10 DIAGNOSIS — R5383 Other fatigue: Secondary | ICD-10-CM | POA: Diagnosis not present

## 2017-01-10 DIAGNOSIS — I5022 Chronic systolic (congestive) heart failure: Secondary | ICD-10-CM

## 2017-01-10 DIAGNOSIS — E7849 Other hyperlipidemia: Secondary | ICD-10-CM

## 2017-01-10 DIAGNOSIS — E784 Other hyperlipidemia: Secondary | ICD-10-CM

## 2017-01-10 DIAGNOSIS — I10 Essential (primary) hypertension: Secondary | ICD-10-CM | POA: Diagnosis not present

## 2017-01-10 LAB — HEPATIC FUNCTION PANEL
ALT: 65 U/L — ABNORMAL HIGH (ref 0–35)
AST: 80 U/L — ABNORMAL HIGH (ref 0–37)
Albumin: 4.2 g/dL (ref 3.5–5.2)
Alkaline Phosphatase: 127 U/L — ABNORMAL HIGH (ref 39–117)
Bilirubin, Direct: 0.2 mg/dL (ref 0.0–0.3)
Total Bilirubin: 0.3 mg/dL (ref 0.2–1.2)
Total Protein: 7.8 g/dL (ref 6.0–8.3)

## 2017-01-10 LAB — BASIC METABOLIC PANEL
BUN: 13 mg/dL (ref 6–23)
CHLORIDE: 95 meq/L — AB (ref 96–112)
CO2: 28 meq/L (ref 19–32)
Calcium: 9.9 mg/dL (ref 8.4–10.5)
Creatinine, Ser: 0.93 mg/dL (ref 0.40–1.20)
GFR: 64.19 mL/min (ref >60.00)
Glucose, Bld: 386 mg/dL — ABNORMAL HIGH (ref 70–99)
Potassium: 3.7 mEq/L (ref 3.5–5.1)
SODIUM: 133 meq/L — AB (ref 135–145)

## 2017-01-10 LAB — TSH: TSH: 1.13 u[IU]/mL (ref 0.35–4.50)

## 2017-01-10 LAB — CBC WITH DIFFERENTIAL/PLATELET
BASOS PCT: 0.5 % (ref 0.0–3.0)
Basophils Absolute: 0.1 10*3/uL (ref 0.0–0.1)
Eosinophils Absolute: 0.2 10*3/uL (ref 0.0–0.7)
Eosinophils Relative: 1.2 % (ref 0.0–5.0)
HCT: 45.5 % (ref 36.0–46.0)
Hemoglobin: 14.8 g/dL (ref 12.0–15.0)
LYMPHS PCT: 32.2 % (ref 12.0–46.0)
Lymphs Abs: 4.3 10*3/uL — ABNORMAL HIGH (ref 0.7–4.0)
MCHC: 32.5 g/dL (ref 30.0–36.0)
MCV: 89.9 fl (ref 78.0–100.0)
MONOS PCT: 8 % (ref 3.0–12.0)
Monocytes Absolute: 1.1 10*3/uL — ABNORMAL HIGH (ref 0.1–1.0)
NEUTROS ABS: 7.7 10*3/uL (ref 1.4–7.7)
Neutrophils Relative %: 58.1 % (ref 43.0–77.0)
Platelets: 215 10*3/uL (ref 150.0–400.0)
RBC: 5.06 Mil/uL (ref 3.87–5.11)
RDW: 13.6 % (ref 11.5–15.5)
WBC: 13.3 10*3/uL — ABNORMAL HIGH (ref 4.0–10.5)

## 2017-01-10 LAB — MICROALBUMIN / CREATININE URINE RATIO
Creatinine,U: 56.5 mg/dL
MICROALB UR: 36 mg/dL — AB (ref 0.0–1.9)
Microalb Creat Ratio: 63.8 mg/g — ABNORMAL HIGH (ref 0.0–30.0)

## 2017-01-10 LAB — HEMOGLOBIN A1C: Hgb A1c MFr Bld: 14.6 % — ABNORMAL HIGH (ref 4.6–6.5)

## 2017-01-10 NOTE — Progress Notes (Signed)
Pre visit review using our clinic review tool, if applicable. No additional management support is needed unless otherwise documented below in the visit note. 

## 2017-01-10 NOTE — Progress Notes (Signed)
Dr. [name] T. Louis Ivery, MD, [facility] [place], [address] Phone: [phone] Fax: [phone]  01/10/2017  Patient: Kristy [name], MRN: [id number], DOB: 22-Jun-1951, 66 y.o.  Primary Physician:  Owens Loffler, MD   Chief Complaint  Patient presents with  . Follow-up    6 month   Subjective:   Kristy [name] is a 66 y.o. very pleasant female patient who presents with the following:  Diabetes Mellitus: Tolerating Medications: yes Compliance with diet: fair Exercise: minimal / intermittent Avg blood sugars at home: yes - from 100 - 600 depending on dietFoot problems: none Hypoglycemia: none No nausea, vomitting, blurred vision, polyuria.  Lab Results  Component Value Date   HGBA1C 14.6 Repeated and verified X2. (H) 01/10/2017   HGBA1C 14.5 (H) 07/12/2016   HGBA1C 14.0 (H) 02/03/2016   Lab Results  Component Value Date   MICROALBUR 36.0 (H) 01/10/2017   LDLCALC 75 04/26/2009   CREATININE 0.93 01/10/2017    Wt Readings from Last 3 Encounters:  01/10/17 156 lb 8 oz (71 kg)  09/13/16 151 lb 12 oz (68.8 kg)  07/20/16 150 lb (68 kg)    Body mass index is 27.72 kg/m.   She also has congestive heart failure but has no symptoms at all and has no shortness of breath.  Hyperlipidemia, the patient has been intolerant of statins, and she is unwilling to go on cholesterol medication.  Blood pressure has been stable.  HTN: Tolerating all medications without side effects Stable and at goal No CP, no sob. No HA.  BP Readings from Last 3 Encounters:  01/10/17 124/66  09/13/16 130/80  07/20/16 459/97    Basic Metabolic Panel:    Component Value Date/Time   NA 133 (L) 01/10/2017 1515   K 3.7 01/10/2017 1515   CL 95 (L) 01/10/2017 1515   CO2 28 01/10/2017 1515   BUN 13 01/10/2017 1515   CREATININE 0.93 01/10/2017 1515   CREATININE 0.72 07/27/2014 0902   GLUCOSE 386 (H) 01/10/2017 1515   CALCIUM  9.9 01/10/2017 1515    Microalbuminuria.  Mood is stable. She is doing well and she is doing okay with her husband's passing.  Past Medical History, Surgical History, Social History, Family History, Problem List, Medications, and Allergies have been reviewed and updated if relevant.  Patient Active Problem List   Diagnosis Date Noted  . Coronary artery disease involving native heart without angina pectoris 09/13/2016  . Primary osteoarthritis involving multiple joints 03/14/2015  . Personal history of other malignant neoplasm of skin 12/01/2014  . Crohn's disease ([place]) 10/13/2013  . Major depressive disorder, recurrent, in full remission ([place]) 05/01/2011  . TRANSAMINASES, SERUM, ELEVATED 05/01/2010  . PSORIASIS 08/02/2009  . Hyperlipidemia 05/11/2009  . IRRITABLE BOWEL SYNDROME 03/10/2009  . GOUT 02/05/2009  . Chronic systolic congestive heart failure ([place]) 01/11/2009  . Type 2 diabetes mellitus with vascular disease ([place])   . Essential hypertension 04/18/2007  . LBBB (left bundle branch block) 04/18/2007  . ALLERGIC RHINITIS 04/18/2007  . GALLSTONES 04/18/2007  . DIVERTICULITIS, HX OF 04/18/2007    Past Medical History:  Diagnosis Date  . Allergic rhinitis   . Cardiomyopathy    mild nonischemic  . Chronic systolic congestive heart failure ([place]) 01/11/2009   Qualifier: Diagnosis of  By: Silvio Pate MD, [facility]   . Crohn's disease ([place]) 10/13/2013  . Diabetes mellitus   . Gout   . Hyperlipidemia   . Hypertension   .  IBS (irritable bowel syndrome)   . Left bundle branch block   . Osteoarthritis   . Symptomatic cholelithiasis     Past Surgical History:  Procedure Laterality Date  . DILATION AND CURETTAGE OF UTERUS    . SKIN SURGERY     nose   . VAGINAL HYSTERECTOMY      Social History   Social History  . Marital status: Married    Spouse name: N/A  . Number of children: N/A  . Years of education: N/A   Occupational History  . Not on file.   Social History  Main Topics  . Smoking status: Former Research scientist (life sciences)  . Smokeless tobacco: Never Used  . Alcohol use No  . Drug use: No  . Sexual activity: Not on file   Other Topics Concern  . Not on file   Social History Narrative  . No narrative on file    Family History  Problem Relation Age of Onset  . Hypertension Father     Allergies  Allergen Reactions  . Fish Oil Other (See Comments)    Gout  . Glimepiride     REACTION: hypoglycemia  . Guanfacine Hcl     REACTION: unspecified  . Rosiglitazone Other (See Comments)  . Rosiglitazone Maleate     REACTION: unspecified    Medication list reviewed and updated in full in [place].   GEN: No acute illnesses, no fevers, chills. GI: No n/v/d, eating normally Pulm: No SOB Interactive and getting along well at home.  Otherwise, ROS is as per the HPI.  Objective:   BP 124/66   Pulse (!) 111   Temp 98.8 F (37.1 C) (Oral)   Ht 5' 3"  (1.6 m)   Wt 156 lb 8 oz (71 kg)   BMI 27.72 kg/m   GEN: WDWN, NAD, Non-toxic, A & O x 3 HEENT: Atraumatic, Normocephalic. Neck supple. No masses, No LAD. Ears and Nose: No external deformity. CV: RRR, No M/G/R. No JVD. No thrill. No extra heart sounds. PULM: CTA B, no wheezes, crackles, rhonchi. No retractions. No resp. distress. No accessory muscle use. EXTR: No c/c/e NEURO Normal gait.  PSYCH: Normally interactive. Conversant. Not depressed or anxious appearing.  Calm demeanor.   Laboratory and Imaging Data: Results for orders placed or performed in visit on 30/86/57  Basic metabolic panel  Result Value Ref Range   Sodium 133 (L) 135 - 145 mEq/L   Potassium 3.7 3.5 - 5.1 mEq/L   Chloride 95 (L) 96 - 112 mEq/L   CO2 28 19 - 32 mEq/L   Glucose, Bld 386 (H) 70 - 99 mg/dL   BUN 13 6 - 23 mg/dL   Creatinine, Ser 0.93 0.40 - 1.20 mg/dL   Calcium 9.9 8.4 - 10.5 mg/dL   GFR 64.19 >60.00 mL/min  CBC with Differential/Platelet  Result Value Ref Range   WBC 13.3 (H) 4.0 - 10.5 K/uL   RBC  5.06 3.87 - 5.11 Mil/uL   Hemoglobin 14.8 12.0 - 15.0 g/dL   HCT 45.5 36.0 - 46.0 %   MCV 89.9 78.0 - 100.0 fl   MCHC 32.5 30.0 - 36.0 g/dL   RDW 13.6 11.5 - 15.5 %   Platelets 215.0 150.0 - 400.0 K/uL   Neutrophils Relative % 58.1 43.0 - 77.0 %   Lymphocytes Relative 32.2 12.0 - 46.0 %   Monocytes Relative 8.0 3.0 - 12.0 %   Eosinophils Relative 1.2 0.0 - 5.0 %   Basophils Relative 0.5 0.0 -  3.0 %   Neutro Abs 7.7 1.4 - 7.7 K/uL   Lymphs Abs 4.3 (H) 0.7 - 4.0 K/uL   Monocytes Absolute 1.1 (H) 0.1 - 1.0 K/uL   Eosinophils Absolute 0.2 0.0 - 0.7 K/uL   Basophils Absolute 0.1 0.0 - 0.1 K/uL  Hepatic function panel  Result Value Ref Range   Total Bilirubin 0.3 0.2 - 1.2 mg/dL   Bilirubin, Direct 0.2 0.0 - 0.3 mg/dL   Alkaline Phosphatase 127 (H) 39 - 117 U/L   AST 80 (H) 0 - 37 U/L   ALT 65 (H) 0 - 35 U/L   Total Protein 7.8 6.0 - 8.3 g/dL   Albumin 4.2 3.5 - 5.2 g/dL  Hemoglobin A1c  Result Value Ref Range   Hgb A1c MFr Bld 14.6 Repeated and verified X2. (H) 4.6 - 6.5 %  Microalbumin / creatinine urine ratio  Result Value Ref Range   Microalb, Ur 36.0 (H) 0.0 - 1.9 mg/dL   Creatinine,U 56.5 mg/dL   Microalb Creat Ratio 63.8 (H) 0.0 - 30.0 mg/g  TSH  Result Value Ref Range   TSH 1.13 0.35 - 4.50 uIU/mL     Assessment and Plan:   Type 2 diabetes mellitus with vascular disease ([place]) - Plan: Basic metabolic panel, Hemoglobin A1c, Microalbumin / creatinine urine ratio  Essential hypertension  Chronic systolic congestive heart failure (HCC)  Other hyperlipidemia  Encounter for long-term current use of medication - Plan: CBC with Differential/Platelet, Hepatic function panel  Other fatigue - Plan: TSH  The patient is a piece with her life and her current situation. She is asymptomatic regarding her congestive heart failure. Blood pressure is under good control.  Her diabetes is as poorly controlled as any patient I have seen in my career. She is on extremely high doses  of insulin. I'm going to send a carbon copy of this note to her endocrinologist, and she is overdue for a follow-up visit. I appreciate Dr. Chrissie Noa help.  Declines statins.  Follow-up: Return in about 6 months (around 07/12/2017) for Medicare Wellness.  Medications Discontinued During This Encounter  Medication Reason  . metoprolol succinate (TOPROL-XL) 50 MG 24 hr tablet Completed Course  . metFORMIN (GLUCOPHAGE-XR) 500 MG 24 hr tablet Completed Course   Orders Placed This Encounter  Procedures  . Basic metabolic panel  . CBC with Differential/Platelet  . Hepatic function panel  . Hemoglobin A1c  . Microalbumin / creatinine urine ratio  . TSH    Signed,  [name] T. Olanrewaju Osborn, MD   Allergies as of 01/10/2017      Reactions   Fish Oil Other (See Comments)   Gout   Glimepiride    REACTION: hypoglycemia   Guanfacine Hcl    REACTION: unspecified   Rosiglitazone Other (See Comments)   Rosiglitazone Maleate    REACTION: unspecified      Medication List       Accurate as of 01/10/17 11:59 PM. Always use your most recent med list.          ALIGN 4 MG Caps Take 1 capsule by mouth daily.   amLODipine 10 MG tablet Commonly known as:  NORVASC Take 1 tablet (10 mg total) by mouth daily.   FLUoxetine 20 MG capsule Commonly known as:  PROZAC Take 1 capsule (20 mg total) by mouth daily.   insulin regular human CONCENTRATED 500 UNIT/ML injection Commonly known as:  HUMULIN R Inject 0.24 mLs (120 Units total) into the skin 3 (three) times daily with  meals.   Insulin Syringe-Needle U-100 31G X 5/16" 0.5 ML Misc Commonly known as:  B-D INS SYRINGE 0.5CC/31GX5/16 USE AS DIRECTED THREE TIMES DAILY.   lisinopril 40 MG tablet Commonly known as:  PRINIVIL,ZESTRIL Take 1 tablet (40 mg total) by mouth daily.   ONE TOUCH ULTRA TEST test strip Generic drug:  glucose blood USE AS PER PACKAGE INSTRUCTIONS CHECK SUGARS UP TO-FOUR TIMES A DAY OR AS PRESCRIBED   traMADol 50 MG  tablet Commonly known as:  ULTRAM Take 1 tablet (50 mg total) by mouth every 6 (six) hours as needed.

## 2017-01-24 DIAGNOSIS — B029 Zoster without complications: Secondary | ICD-10-CM | POA: Diagnosis not present

## 2017-01-24 DIAGNOSIS — R69 Illness, unspecified: Secondary | ICD-10-CM | POA: Diagnosis not present

## 2017-01-25 DIAGNOSIS — A499 Bacterial infection, unspecified: Secondary | ICD-10-CM | POA: Diagnosis not present

## 2017-01-25 DIAGNOSIS — B0239 Other herpes zoster eye disease: Secondary | ICD-10-CM | POA: Diagnosis not present

## 2017-01-25 DIAGNOSIS — B029 Zoster without complications: Secondary | ICD-10-CM | POA: Diagnosis not present

## 2017-01-25 DIAGNOSIS — H2513 Age-related nuclear cataract, bilateral: Secondary | ICD-10-CM | POA: Diagnosis not present

## 2017-01-25 LAB — HM DIABETES EYE EXAM

## 2017-02-09 DIAGNOSIS — H2513 Age-related nuclear cataract, bilateral: Secondary | ICD-10-CM | POA: Diagnosis not present

## 2017-02-09 DIAGNOSIS — B029 Zoster without complications: Secondary | ICD-10-CM | POA: Diagnosis not present

## 2017-03-05 ENCOUNTER — Telehealth: Payer: Self-pay | Admitting: Family Medicine

## 2017-03-05 DIAGNOSIS — R69 Illness, unspecified: Secondary | ICD-10-CM | POA: Diagnosis not present

## 2017-03-05 MED ORDER — INSULIN REGULAR HUMAN (CONC) 500 UNIT/ML ~~LOC~~ SOLN: 120.0000 [IU] | Freq: Three times a day (TID) | SUBCUTANEOUS | 3 refills | Status: DC

## 2017-03-05 NOTE — Telephone Encounter (Signed)
Application and Rx completed and placed in Dr. Lillie Fragmin in box for signature.

## 2017-03-05 NOTE — Telephone Encounter (Signed)
Application and Rx for Humulin R 500 unit/ml  mailed to [facility] as requested by patient.

## 2017-03-05 NOTE — Telephone Encounter (Signed)
Pt dropped off forms to get discounted insulin for her diabetes. Right now she is paying 500 out of pocket each month.  She states that once the forms are sent in that she can receive insulin at no cost and it is shipped to our office.  Please mail document in the addressed envelope provided and call pt when this is completed. Placing in rx tower  Thanks

## 2017-03-10 NOTE — Progress Notes (Signed)
Cardiology Office Note  Date:  03/12/2017   ID:  Kristy [name], DOB 10-Mar-1951, MRN [id number]  PCP:  Owens Loffler, MD   Chief Complaint  Patient presents with  . other    6 month follow up. Patient c/o Chest pain and feels like needles. Meds reviewed verbally with patient. Meds reviewed verbally with patient.      HPI:  66 yo with HTN,  diabetes, Hemoglobin A1c of 14 GI disease/IBS,  Hyperlipidemia h/o smoking,  Previous cardiac catheterization in 2009 showed mild luminal irregularities.  Crohn's disease. Normal EF in 07/2009 chronic tachycardia presents for followup of her hyperlipidemia, coronary artery disease  For her tachycardia, she has Tried metoprolol succinate 50 but had intolerance Tried coreg , again with intolerance  Heart rate typically 110 up to 120 on a regular basis, she is asymptomatic Long discussion with her, she was previously on verapamil in her 62s This was held for unclear reasons  Diabetes numbers continue to run high Previously seen by endocrinology Active, doing gardening  Husband passed away from complications of severe underlying lung disease, fibrosis   EKG personally reviewed by myself on todays visit Shows sinus tachycardia rate 111 bpm left bundle branch block  Other past medical history reviewed with her Denies any complications from her diabetes LDL 170, up from 64 eight years ago   retired August 2017 Previously reported that "none of her diabetes medications work, on "300 units of insulin, does not work"  Last echo in 10/10 showed EF 50-55% with dysynnergy due to left bundle branch block.    PMH:   has a past medical history of Allergic rhinitis; Cardiomyopathy; Chronic systolic congestive heart failure ([place]) (01/11/2009); Crohn's disease ([place]) (10/13/2013); Diabetes mellitus; Gout; Hyperlipidemia; Hypertension; IBS (irritable bowel syndrome); Left bundle branch block; Osteoarthritis; and Symptomatic cholelithiasis.  PSH:     Past Surgical History:  Procedure Laterality Date  . DILATION AND CURETTAGE OF UTERUS    . SKIN SURGERY     nose   . VAGINAL HYSTERECTOMY      Current Outpatient Prescriptions  Medication Sig Dispense Refill  . FLUoxetine (PROZAC) 20 MG capsule Take 1 capsule (20 mg total) by mouth daily. 30 capsule 5  . insulin regular human CONCENTRATED (HUMULIN R) 500 UNIT/ML injection Inject 0.24 mLs (120 Units total) into the skin 3 (three) times daily with meals. 60 mL 3  . Insulin Syringe-Needle U-100 (B-D INS SYRINGE 0.5CC/31GX5/16) 31G X 5/16" 0.5 ML MISC USE AS DIRECTED THREE TIMES DAILY. 100 each 5  . lisinopril (PRINIVIL,ZESTRIL) 40 MG tablet Take 1 tablet (40 mg total) by mouth daily. 90 tablet 3  . ONE TOUCH ULTRA TEST test strip USE AS PER PACKAGE INSTRUCTIONS CHECK SUGARS UP TO-FOUR TIMES A DAY OR AS PRESCRIBED 150 each 5  . Probiotic Product (ALIGN) 4 MG CAPS Take 1 capsule by mouth daily.     . traMADol (ULTRAM) 50 MG tablet Take 1 tablet (50 mg total) by mouth every 6 (six) hours as needed. 90 tablet 3  . verapamil (VERELAN PM) 180 MG 24 hr capsule Take 1 capsule (180 mg total) by mouth at bedtime. 30 capsule 11   No current facility-administered medications for this visit.      Allergies:   Fish oil; Glimepiride; Guanfacine hcl; Rosiglitazone; and Rosiglitazone maleate   Social History:  The patient  reports that she has quit smoking. She has never used smokeless tobacco. She reports that she does not drink alcohol or use drugs.  Family History:   family history includes Hypertension in her father.    Review of Systems: Review of Systems  Constitutional: Negative.   Respiratory: Negative.   Cardiovascular: Negative.   Gastrointestinal: Negative.   Musculoskeletal: Negative.   Neurological: Negative.   Psychiatric/Behavioral: Negative.   All other systems reviewed and are negative.    PHYSICAL EXAM: VS:  BP 138/78 (BP Location: Left Arm, Patient Position: Sitting,  Cuff Size: Normal)   Pulse (!) 111   Ht 5' 3"  (1.6 m)   Wt 152 lb 12 oz (69.3 kg)   BMI 27.06 kg/m  , BMI Body mass index is 27.06 kg/m. GEN: Well nourished, well developed, in no acute distress  HEENT: normal  Neck: no JVD, carotid bruits, or masses Cardiac: Regular rhythm, tachycardia,; no murmurs, rubs, or gallops,no edema  Respiratory:  clear to auscultation bilaterally, normal work of breathing GI: soft, nontender, nondistended, + BS MS: no deformity or atrophy  Skin: warm and dry, no rash Neuro:  Strength and sensation are intact Psych: euthymic mood, full affect    Recent Labs: 01/10/2017: ALT 65; BUN 13; Creatinine, Ser 0.93; Hemoglobin 14.8; Platelets 215.0; Potassium 3.7; Sodium 133; TSH 1.13    Lipid Panel Lab Results  Component Value Date   CHOL 245 (H) 02/03/2016   HDL 35.40 (L) 02/03/2016   LDLCALC 75 04/26/2009   TRIG 210.0 (H) 02/03/2016      Wt Readings from Last 3 Encounters:  03/12/17 152 lb 12 oz (69.3 kg)  01/10/17 156 lb 8 oz (71 kg)  09/13/16 151 lb 12 oz (68.8 kg)       ASSESSMENT AND PLAN:   Essential hypertension - Plan: EKG 12-Lead Recommended she hold the amlodipine and start verapamil extended release 180 mg daily Continue ACE inhibitor  Sinus tachycardia - Plan: EKG 12-Lead She does not want a beta blocker Intolerance of metoprolol succinate and carvedilol We will try verapamil  LBBB (left bundle branch block) - Plan: EKG 12-Lead  Type 2 diabetes mellitus with vascular disease ([place]) Followed by endocrinology and primary care  reluctant to seek [facility] opinion at [facility]  Other hyperlipidemia Previously indicated she does not want a statin Recommended improved diabetes control which will help her cholesterol numbers  CAD  mild disease on prior catheterization 2009 Stressed importance of improving her risk factors to minimize progression   Total encounter time more than 25 minutes  Greater than 50% was spent in counseling and  coordination of care with the patient  Disposition:   F/U  12 months   Orders Placed This Encounter  Procedures  . EKG 12-Lead     Signed, Esmond Plants, M.D., Ph.D. 03/12/2017  [place], [place]

## 2017-03-12 ENCOUNTER — Ambulatory Visit (INDEPENDENT_AMBULATORY_CARE_PROVIDER_SITE_OTHER): Payer: Medicare HMO | Admitting: Cardiovascular Disease

## 2017-03-12 ENCOUNTER — Encounter: Payer: Self-pay | Admitting: Cardiovascular Disease

## 2017-03-12 VITALS — BP 138/78 | HR 111 | Ht 63.0 in | Wt 152.8 lb

## 2017-03-12 DIAGNOSIS — E1159 Type 2 diabetes mellitus with other circulatory complications: Secondary | ICD-10-CM

## 2017-03-12 DIAGNOSIS — I10 Essential (primary) hypertension: Secondary | ICD-10-CM | POA: Diagnosis not present

## 2017-03-12 DIAGNOSIS — E784 Other hyperlipidemia: Secondary | ICD-10-CM | POA: Diagnosis not present

## 2017-03-12 DIAGNOSIS — E7849 Other hyperlipidemia: Secondary | ICD-10-CM

## 2017-03-12 MED ORDER — VERAPAMIL HCL ER 180 MG PO CP24: 180.0000 mg | ORAL_CAPSULE | Freq: Every day | ORAL | 11 refills | Status: DC

## 2017-03-12 NOTE — Patient Instructions (Addendum)
Medication Instructions:   Please stop the amlodipine  Start verapamil 180 mg once a day for blood pressure and elevated heart rate  Monitor the heart rate We can increase the pill dose if needed  Labwork:  No new labs needed  Testing/Procedures:  No further testing at this time   I recommend watching educational videos on topics of interest to you at:       [email or web address].goemmi.com  Enter code: [email or web address]    Follow-Up: It was a pleasure seeing you in the office today. Please call us if you have new issues that need to be addressed before your next appt.  [phone]  Your physician wants you to follow-up in: 12 months.  You will receive a reminder letter in the mail two months in advance. If you don't receive a letter, please call our office to schedule the follow-up appointment.  If you need a refill on your cardiac medications before your next appointment, please call your pharmacy.

## 2017-04-12 ENCOUNTER — Telehealth: Payer: Self-pay

## 2017-04-12 NOTE — Telephone Encounter (Signed)
I spoke with [employer] patient assistance program regarding delay in processing patient's paperwork as we have sent this twice already.  Per Rep, there is nothing further that they need and paperwork is now complete.  They are processing the order now and our office should receive the insulin shipment within 10 - 14 days.  I contacted patient and notified her of the update and that we will call her once her medication has been delivered and is ready for pickup. A copy of this completed paperwork will be scanned in to patient's chart.  Patient thanks [place] for our assistance in getting this processed.

## 2017-04-12 NOTE — Telephone Encounter (Signed)
Kristy [name] does meet eligibility criteria and is enrolled in [place] until the end of the calendar year.

## 2017-04-16 ENCOUNTER — Other Ambulatory Visit: Payer: Self-pay | Admitting: Family Medicine

## 2017-04-16 NOTE — Telephone Encounter (Signed)
Ok to refill 90, 3 ref

## 2017-04-16 NOTE — Telephone Encounter (Signed)
Last office visit 01/10/2017.  Last refilled 01/10/2016 for #90 with 3 refills.  Ok to refill?

## 2017-04-16 NOTE — Telephone Encounter (Signed)
Tramadol called into [place], [place] Phone: [phone]

## 2017-04-19 ENCOUNTER — Encounter: Payer: Self-pay | Admitting: Internal Medicine

## 2017-04-20 DIAGNOSIS — R69 Illness, unspecified: Secondary | ICD-10-CM | POA: Diagnosis not present

## 2017-05-07 ENCOUNTER — Telehealth: Payer: Self-pay

## 2017-05-07 NOTE — Telephone Encounter (Signed)
Left message for Kristy [name] to return my call.

## 2017-05-07 NOTE — Telephone Encounter (Signed)
Spoke with [name].  She is inquiring about her insulin that [facility] was suppose to ship her.  I advised we have not received any insulin from [facility] but I would try and call them today to check on status.

## 2017-05-07 NOTE — Telephone Encounter (Signed)
Pt left v/m requesting status of paperwork that was sent for pt to get insulin; pt will be out of insulin next week. Pt request cb.

## 2017-05-07 NOTE — Telephone Encounter (Signed)
Spoke will [employer] Rep.. They states the application was processed but order was never processed through distribution.  They were needed clarification on Dr. Lillie Fragmin medical license.  She then states that his license information has been updated and that I can call back in 2 days for clarification on shipping.  Ms. Dorrance notified by telephone.

## 2017-05-09 NOTE — Telephone Encounter (Signed)
Spoke with [employer].  Insulin has been shipped.  Due to arrive here at the office today by 3 pm by [employer].  Tracking [phone].  Left message for Ms. Hippert that insulin is due to arrive today and I will call her as soon as I get it.

## 2017-05-09 NOTE — Telephone Encounter (Signed)
Received from [facility].  Humulin R U-500  Lot [id number] A Exp: 01/2018 x 20 ml bottles.  Ms. Deeney notified by telephone to come by office to pick up her insulin.

## 2017-05-21 ENCOUNTER — Other Ambulatory Visit: Payer: Self-pay | Admitting: Family Medicine

## 2017-05-21 DIAGNOSIS — R69 Illness, unspecified: Secondary | ICD-10-CM | POA: Diagnosis not present

## 2017-06-25 DIAGNOSIS — R69 Illness, unspecified: Secondary | ICD-10-CM | POA: Diagnosis not present

## 2017-07-02 ENCOUNTER — Other Ambulatory Visit: Payer: Self-pay | Admitting: Family Medicine

## 2017-07-02 NOTE — Telephone Encounter (Signed)
Last office visit 01/10/17.  Not on current medication list.  Refill?

## 2017-07-09 ENCOUNTER — Other Ambulatory Visit (INDEPENDENT_AMBULATORY_CARE_PROVIDER_SITE_OTHER): Payer: Medicare HMO

## 2017-07-09 DIAGNOSIS — Z79899 Other long term (current) drug therapy: Secondary | ICD-10-CM

## 2017-07-09 DIAGNOSIS — E119 Type 2 diabetes mellitus without complications: Secondary | ICD-10-CM

## 2017-07-09 DIAGNOSIS — E785 Hyperlipidemia, unspecified: Secondary | ICD-10-CM | POA: Diagnosis not present

## 2017-07-09 LAB — CBC WITH DIFFERENTIAL/PLATELET
BASOS PCT: 1.4 % (ref 0.0–3.0)
Basophils Absolute: 0.1 10*3/uL (ref 0.0–0.1)
EOS PCT: 2.4 % (ref 0.0–5.0)
Eosinophils Absolute: 0.2 10*3/uL (ref 0.0–0.7)
HCT: 45.3 % (ref 36.0–46.0)
Hemoglobin: 14.6 g/dL (ref 12.0–15.0)
LYMPHS ABS: 3.9 10*3/uL (ref 0.7–4.0)
Lymphocytes Relative: 39.1 % (ref 12.0–46.0)
MCHC: 32.2 g/dL (ref 30.0–36.0)
MCV: 92.4 fl (ref 78.0–100.0)
MONO ABS: 1 10*3/uL (ref 0.1–1.0)
MONOS PCT: 10.5 % (ref 3.0–12.0)
NEUTROS PCT: 46.6 % (ref 43.0–77.0)
Neutro Abs: 4.6 10*3/uL (ref 1.4–7.7)
PLATELETS: 184 10*3/uL (ref 150.0–400.0)
RBC: 4.9 Mil/uL (ref 3.87–5.11)
RDW: 14.1 % (ref 11.5–15.5)
WBC: 9.9 10*3/uL (ref 4.0–10.5)

## 2017-07-09 LAB — BASIC METABOLIC PANEL
BUN: 13 mg/dL (ref 6–23)
CHLORIDE: 101 meq/L (ref 96–112)
CO2: 31 meq/L (ref 19–32)
Calcium: 10.1 mg/dL (ref 8.4–10.5)
Creatinine, Ser: 0.79 mg/dL (ref 0.40–1.20)
GFR: 77.37 mL/min (ref >60.00)
Glucose, Bld: 184 mg/dL — ABNORMAL HIGH (ref 70–99)
POTASSIUM: 3.9 meq/L (ref 3.5–5.1)
SODIUM: 140 meq/L (ref 135–145)

## 2017-07-09 LAB — LIPID PANEL
CHOLESTEROL: 239 mg/dL — AB (ref 0–200)
HDL: 35.6 mg/dL — AB (ref >39.00)
NonHDL: 203.71
TRIGLYCERIDES: 201 mg/dL — AB (ref 0.0–149.0)
Total CHOL/HDL Ratio: 7
VLDL: 40.2 mg/dL — ABNORMAL HIGH (ref 0.0–40.0)

## 2017-07-09 LAB — HEPATIC FUNCTION PANEL
ALT: 46 U/L — ABNORMAL HIGH (ref 0–35)
AST: 46 U/L — ABNORMAL HIGH (ref 0–37)
Albumin: 4 g/dL (ref 3.5–5.2)
Alkaline Phosphatase: 108 U/L (ref 39–117)
BILIRUBIN DIRECT: 0.1 mg/dL (ref 0.0–0.3)
BILIRUBIN TOTAL: 0.4 mg/dL (ref 0.2–1.2)
Total Protein: 6.9 g/dL (ref 6.0–8.3)

## 2017-07-09 LAB — HEMOGLOBIN A1C: Hgb A1c MFr Bld: 12.4 % — ABNORMAL HIGH (ref 4.6–6.5)

## 2017-07-09 LAB — LDL CHOLESTEROL, DIRECT: LDL DIRECT: 168 mg/dL

## 2017-07-16 ENCOUNTER — Encounter: Payer: Self-pay | Admitting: Internal Medicine

## 2017-07-16 ENCOUNTER — Encounter: Payer: Self-pay | Admitting: Family Medicine

## 2017-07-16 ENCOUNTER — Ambulatory Visit (INDEPENDENT_AMBULATORY_CARE_PROVIDER_SITE_OTHER): Payer: Medicare HMO | Admitting: Family Medicine

## 2017-07-16 VITALS — BP 130/80 | HR 102 | Temp 98.4°F | Ht 62.75 in | Wt 159.2 lb

## 2017-07-16 DIAGNOSIS — L989 Disorder of the skin and subcutaneous tissue, unspecified: Secondary | ICD-10-CM

## 2017-07-16 DIAGNOSIS — E1159 Type 2 diabetes mellitus with other circulatory complications: Secondary | ICD-10-CM

## 2017-07-16 DIAGNOSIS — K625 Hemorrhage of anus and rectum: Secondary | ICD-10-CM | POA: Diagnosis not present

## 2017-07-16 DIAGNOSIS — Z1211 Encounter for screening for malignant neoplasm of colon: Secondary | ICD-10-CM | POA: Diagnosis not present

## 2017-07-16 DIAGNOSIS — M79671 Pain in right foot: Secondary | ICD-10-CM | POA: Diagnosis not present

## 2017-07-16 DIAGNOSIS — M79672 Pain in left foot: Secondary | ICD-10-CM

## 2017-07-16 DIAGNOSIS — Z0000 Encounter for general adult medical examination without abnormal findings: Secondary | ICD-10-CM

## 2017-07-16 NOTE — Progress Notes (Signed)
Dr. [name] T. Sheriden Archibeque, MD, [facility] [place], [address] Phone: [phone] Fax: [phone]  07/16/2017  Patient: Kristy [name], MRN: [id number], DOB: 1951-07-31, 66 y.o.  Primary Physician:  Owens Loffler, MD   Chief Complaint  Patient presents with  . Welcome to Medicare   Subjective:   Kristy [name] is a 66 y.o. pleasant patient who presents for a medicare wellness examination and general CPX:  Health Maintenance Summary Reviewed and updated, unless pt declines services.  Tobacco History Reviewed. Non-smoker Alcohol: No concerns, no excessive use Exercise Habits: walks twice a day STD concerns: none Drug Use: None Birth control method: n/a Menses regular: n/a Lumps or breast concerns: no Breast Cancer Family History: no  Colonoscopy - rectal bleeding, colon, GI. Carlean Purl ([facility]) Diabetic educators - dm Dermatologist - Sarina Ser. Skin lesion.seb k Podiatrist - foot pain   mammo - dr. Laurey Morale retired.   Health Maintenance  Topic Date Due  . FOOT EXAM  06/06/1961  . DEXA SCAN  06/06/2016  . MAMMOGRAM  04/06/2017  . INFLUENZA VACCINE  12/30/2017 (Originally 05/02/2017)  . PNA vac Low Risk Adult (1 of 2 - PCV13) 07/16/2018 (Originally 06/06/2016)  . COLONOSCOPY  10/02/2017  . TETANUS/TDAP  01/05/2018  . HEMOGLOBIN A1C  01/07/2018  . OPHTHALMOLOGY EXAM  01/25/2018  . Hepatitis C Screening  Completed   Immunization History  Administered Date(s) Administered  . Td 10/02/1993, 01/06/2008    Patient Active Problem List   Diagnosis Date Noted  . Healthcare maintenance 07/16/2017  . Coronary artery disease involving native heart without angina pectoris 09/13/2016  . Primary osteoarthritis involving multiple joints 03/14/2015  . Personal history of other malignant neoplasm of skin 12/01/2014  . Crohn's disease ([place]) 10/13/2013  . Major depressive disorder, recurrent, in full remission  ([place]) 05/01/2011  . TRANSAMINASES, SERUM, ELEVATED 05/01/2010  . PSORIASIS 08/02/2009  . Hyperlipidemia 05/11/2009  . IRRITABLE BOWEL SYNDROME 03/10/2009  . GOUT 02/05/2009  . Type 2 diabetes mellitus with vascular disease ([place])   . Essential hypertension 04/18/2007  . LBBB (left bundle branch block) 04/18/2007  . ALLERGIC RHINITIS 04/18/2007  . GALLSTONES 04/18/2007  . DIVERTICULITIS, HX OF 04/18/2007   Past Medical History:  Diagnosis Date  . Allergic rhinitis   . Cardiomyopathy    mild nonischemic  . Chronic systolic congestive heart failure ([place]) 01/11/2009   Qualifier: Diagnosis of  By: Silvio Pate MD, [facility]   . Crohn's disease ([place]) 10/13/2013  . Diabetes mellitus   . Gout   . Hyperlipidemia   . Hypertension   . IBS (irritable bowel syndrome)   . Left bundle branch block   . Osteoarthritis   . Symptomatic cholelithiasis    Past Surgical History:  Procedure Laterality Date  . DILATION AND CURETTAGE OF UTERUS    . SKIN SURGERY     nose   . VAGINAL HYSTERECTOMY     Social History   Social History  . Marital status: Married    Spouse name: N/A  . Number of children: N/A  . Years of education: N/A   Occupational History  . Not on file.   Social History Main Topics  . Smoking status: Former Research scientist (life sciences)  . Smokeless tobacco: Never Used  . Alcohol use No  . Drug use: No  . Sexual activity: Not on file   Other Topics Concern  . Not on file   Social History Narrative  . No  narrative on file   Family History  Problem Relation Age of Onset  . Hypertension Father    Allergies  Allergen Reactions  . Fish Oil Other (See Comments)    Gout  . Glimepiride     REACTION: hypoglycemia  . Guanfacine Hcl     REACTION: unspecified  . Rosiglitazone Other (See Comments)  . Rosiglitazone Maleate     REACTION: unspecified    Medication list has been reviewed and updated.   General: Denies fever, chills, sweats. No significant weight loss. Eyes: Denies  blurring,significant itching ENT: Denies earache, sore throat, and hoarseness.  Cardiovascular: Denies chest pains, palpitations, dyspnea on exertion,  Respiratory: Denies cough, dyspnea at rest,wheeezing Breast: no concerns about lumps GI: H/O RECTAL BLEEDING IN June GU: Denies dysuria, hematuria, urinary hesitancy, nocturia, denies STD risk, no concerns about discharge Musculoskeletal: FOOT PAIN Derm: SEVERAL SKIN LESIONS Neuro: Denies  paresthesias, frequent falls, frequent headaches Psych: Denies depression, anxiety Endocrine: Denies cold intolerance, heat intolerance, polydipsia Heme: Denies enlarged lymph nodes Allergy: No hayfever  Objective:   BP 130/80   Pulse (!) 102   Temp 98.4 F (36.9 C) (Oral)   Ht 5' 2.75" (1.594 m)   Wt 159 lb 4 oz (72.2 kg)   BMI 28.44 kg/m   The patient completed a fall screen and PHQ-2 and PHQ-9 if necessary, which is documented in the EHR. The CMA/LPN/RN who assisted the patient verbally completed with them and documented results in [facility] EHR.   Hearing Screening   Method: Audiometry   125Hz  250Hz  500Hz  1000Hz  2000Hz  3000Hz  4000Hz  6000Hz  8000Hz   Right ear:   25 25 25   40    Left ear:   20 25 25   0    Vision Screening Comments: Wear Glasses-Eye Exam with Dr. Katy Fitch 01/25/2017  GEN: well developed, well nourished, no acute distress Eyes: conjunctiva and lids normal, PERRLA, EOMI ENT: TM clear, nares clear, oral exam WNL Neck: supple, no lymphadenopathy, no thyromegaly, no JVD Pulm: clear to auscultation and percussion, respiratory effort normal CV: regular rate and rhythm, S1-S2, no murmur, rub or gallop, no bruits Chest: no scars, masses, no lumps BREAST: breast exam declined GI: soft, non-tender; no hepatosplenomegaly, masses; active bowel sounds all quadrants GU: GU exam declined Lymph: no cervical, axillary or inguinal adenopathy MSK: gait normal, muscle tone and strength WNL, no joint swelling, effusions, discoloration,  crepitus  SKIN: clear, good turgor, color WNL, no rashes, lesions, or ulcerations Neuro: normal mental status, normal strength, sensation, and motion Psych: alert; oriented to person, place and time, normally interactive and not anxious or depressed in appearance.   All labs reviewed with patient. Lipids:    Component Value Date/Time   CHOL 239 (H) 07/09/2017 1540   TRIG 201.0 (H) 07/09/2017 1540   HDL 35.60 (L) 07/09/2017 1540   LDLDIRECT 168.0 07/09/2017 1540   VLDL 40.2 (H) 07/09/2017 1540   CHOLHDL 7 07/09/2017 1540   CBC: CBC Latest Ref Rng & Units 07/09/2017 01/10/2017 02/03/2016  WBC 4.0 - 10.5 K/uL 9.9 13.3(H) 9.1  Hemoglobin 12.0 - 15.0 g/dL 14.6 14.8 13.9  Hematocrit 36.0 - 46.0 % 45.3 45.5 41.8  Platelets 150.0 - 400.0 K/uL 184.0 215.0 007.1    Basic Metabolic Panel:    Component Value Date/Time   NA 140 07/09/2017 1540   K 3.9 07/09/2017 1540   CL 101 07/09/2017 1540   CO2 31 07/09/2017 1540   BUN 13 07/09/2017 1540   CREATININE 0.79 07/09/2017 1540  CREATININE 0.72 07/27/2014 0902   GLUCOSE 184 (H) 07/09/2017 1540   CALCIUM 10.1 07/09/2017 1540   Hepatic Function Latest Ref Rng & Units 07/09/2017 01/10/2017 02/03/2016  Total Protein 6.0 - 8.3 g/dL 6.9 7.8 6.7  Albumin 3.5 - 5.2 g/dL 4.0 4.2 3.9  AST 0 - 37 U/L 46(H) 80(H) 45(H)  ALT 0 - 35 U/L 46(H) 65(H) 40(H)  Alk Phosphatase 39 - 117 U/L 108 127(H) 117  Total Bilirubin 0.2 - 1.2 mg/dL 0.4 0.3 0.5  Bilirubin, Direct 0.0 - 0.3 mg/dL 0.1 0.2 0.1    Lab Results  Component Value Date   TSH 1.13 01/10/2017    No results found.  Assessment and Plan:   Healthcare maintenance  Type 2 diabetes mellitus with vascular disease ([place]) - Plan: Ambulatory referral to diabetic education  Rectal bleeding - Plan: Ambulatory referral to Gastroenterology  Screen for colon cancer - Plan: Ambulatory referral to Gastroenterology  Skin lesions - Plan: Ambulatory referral to Dermatology  Foot pain, bilateral - Plan:  Ambulatory referral to Podiatry  a1c is improved but still 12 Rectal bleeding, consult GI. Multiple skin lesions, patient desires full body skin check. Consult dermatology.  Health Maintenance Exam: The patient's preventative maintenance and recommended screening tests for an annual wellness exam were reviewed in full today. Brought up to date unless services declined.  Counselled on the importance of diet, exercise, and its role in overall health and mortality. The patient's FH and SH was reviewed, including their home life, tobacco status, and drug and alcohol status.  Follow-up in 1 year for physical exam or additional follow-up below.  I have personally reviewed the Medicare Annual Wellness questionnaire and have noted 1. The patient's medical and social history 2. Their use of alcohol, tobacco or illicit drugs 3. Their current medications and supplements 4. The patient's functional ability including ADL's, fall risks, home safety risks and hearing or visual             impairment. 5. Diet and physical activities 6. Evidence for depression or mood disorders 7. Reviewed Updated provider list, see scanned forms and [facility] Snapshot.   The patients weight, height, BMI and visual acuity have been recorded in the chart I have made referrals, counseling and provided education to the patient based review of the above and I have provided the pt with a written personalized care plan for preventive services.  I have provided the patient with a copy of your personalized plan for preventive services. Instructed to take the time to review along with their updated medication list.  Follow-up: No Follow-up on file. Or follow-up in 1 year unless noted.  Future Appointments Date Time Provider [place]  07/20/2017 10:00 AM Edrick Kins, DPM [facility]  08/28/2017 1:00 PM [facility] PREVISIT RM50 [facility]  09/11/2017 8:00 AM Gatha Mayer, MD [facility]    01/16/2018 2:15 PM Ayla Dunigan, [name], MD [facility]   Orders Placed This Encounter  Procedures  . Ambulatory referral to Gastroenterology  . Ambulatory referral to diabetic education  . Ambulatory referral to Dermatology  . Ambulatory referral to Podiatry    Signed,  [name] T. Nitasha Jewel, MD   Allergies as of 07/16/2017      Reactions   Fish Oil Other (See Comments)   Gout   Glimepiride    REACTION: hypoglycemia   Guanfacine Hcl    REACTION: unspecified   Rosiglitazone Other (See Comments)   Rosiglitazone Maleate    REACTION: unspecified  Medication List       Accurate as of 07/16/17  5:17 PM. Always use your most recent med list.          ALIGN 4 MG Caps Take 1 capsule by mouth daily.   COLCRYS 0.6 MG tablet Generic drug:  colchicine TAKE 1 TABLET EVERY DAY   FLUoxetine 20 MG capsule Commonly known as:  PROZAC Take 1 capsule (20 mg total) by mouth daily.   insulin regular human CONCENTRATED 500 UNIT/ML injection Commonly known as:  HUMULIN R Inject 0.24 mLs (120 Units total) into the skin 3 (three) times daily with meals.   Insulin Syringe-Needle U-100 31G X 5/16" 0.5 ML Misc Commonly known as:  B-D INS SYRINGE 0.5CC/31GX5/16 USE AS DIRECTED THREE TIMES DAILY.   lisinopril 40 MG tablet Commonly known as:  PRINIVIL,ZESTRIL Take 1 tablet (40 mg total) by mouth daily.   ONE TOUCH ULTRA TEST test strip Generic drug:  glucose blood USE AS PER PACKAGE INSTRUCTIONS CHECK SUGARS UP TO-FOUR TIMES A DAY OR AS PRESCRIBED   traMADol 50 MG tablet Commonly known as:  ULTRAM TAKE 1 TABLET EVERY 6 HOURS AS NEEDED   verapamil 180 MG 24 hr capsule Commonly known as:  VERELAN PM Take 1 capsule (180 mg total) by mouth at bedtime.

## 2017-07-16 NOTE — Patient Instructions (Addendum)
REFERRALS TO SPECIALISTS, SPECIAL TESTS (MRI, CT, ULTRASOUNDS)  [name] or [facility]DA will help you. ASK CHECK-IN FOR HELP.  Imaging / Special Testing referrals sometimes can be done same day if EMERGENCY, but others can take 2 or 3 days to get an appointment. Starting in 2015, many of the new Medicare plans and Obamacare plans take much longer.   Specialist appointment times vary a great deal, based on their schedule / openings. -- Some specialists have very long wait times. (Example. Dermatology. Multiple months  for non-cancer)     You do not need a referral to make a mammogram appointment, and you may call to make her own mammogram appointment directly around your schedule.  MAMMOGRAPHY IN [place]:  [place] [phone] [place], [place] [address]  [facility] ([facility]ly [facility]) [address] [place], [place] [address] Phone: [phone] Toll Free: [phone]  MAMMOGRAPHY IN [place]:  [address] [facility] or [place]) [phone] Located on the campus of [facility])  [facility]) [place].  [place], [place] [address]

## 2017-07-20 ENCOUNTER — Ambulatory Visit (INDEPENDENT_AMBULATORY_CARE_PROVIDER_SITE_OTHER): Payer: Medicare HMO | Admitting: Podiatry

## 2017-07-20 ENCOUNTER — Ambulatory Visit: Payer: Medicare HMO

## 2017-07-20 DIAGNOSIS — E0843 Diabetes mellitus due to underlying condition with diabetic autonomic (poly)neuropathy: Secondary | ICD-10-CM | POA: Diagnosis not present

## 2017-07-20 DIAGNOSIS — E0842 Diabetes mellitus due to underlying condition with diabetic polyneuropathy: Secondary | ICD-10-CM | POA: Diagnosis not present

## 2017-07-20 MED ORDER — NONFORMULARY OR COMPOUNDED ITEM: 2 refills | Status: DC

## 2017-07-20 NOTE — Progress Notes (Signed)
   Subjective:    Patient ID: Kristy [name], female    DOB: 1950-10-29, 66 y.o.   MRN: [id number]  HPI    Review of Systems     Objective:   Physical Exam        Assessment & Plan:

## 2017-07-23 NOTE — Progress Notes (Signed)
   HPI: 66 year old female with PMHx of DM presenting today as a new patient with a complaint of burning pain to the plantar aspect of bilateral forefeet that began approximately 2 years ago. She reports associated tingling to the area. She states the pain is worse at night prior to going to bed. She has not done anything to treat the symptoms. She reports h/o gout. She is here for further evaluation and treatment.    Past Medical History:  Diagnosis Date  . Allergic rhinitis   . Cardiomyopathy    mild nonischemic  . Chronic systolic congestive heart failure ([place]) 01/11/2009   Qualifier: Diagnosis of  By: Silvio Pate MD, [facility]   . Crohn's disease ([place]) 10/13/2013  . Diabetes mellitus   . Gout   . Hyperlipidemia   . Hypertension   . IBS (irritable bowel syndrome)   . Left bundle branch block   . Osteoarthritis   . Symptomatic cholelithiasis      Physical Exam: General: The patient is alert and oriented x3 in no acute distress.  Dermatology: Skin is warm, dry and supple bilateral lower extremities. Negative for open lesions or macerations.  Vascular: Palpable pedal pulses bilaterally. No edema or erythema noted. Capillary refill within normal limits.  Neurological: Epicritic and protective threshold grossly intact bilaterally.   Musculoskeletal Exam: Range of motion within normal limits to all pedal and ankle joints bilateral. Muscle strength 5/5 in all groups bilateral.   Radiographic Exam:  Normal osseous mineralization. Joint spaces preserved. No fracture/dislocation/boney destruction.    Assessment: - DM with polyneuropathy   Plan of Care:  - Patient evaluated. X-Rays reviewed. - Prescription for neuropathy pain cream to be dispensed by [place]. - Return to clinic annually.     Edrick Kins, DPM [facility]  Dr. Edrick Kins, DPM    [address], [place] [address]                Office  [phone]  Fax [phone]

## 2017-07-27 ENCOUNTER — Encounter: Payer: Medicare HMO | Attending: Family Medicine | Admitting: *Deleted

## 2017-07-27 ENCOUNTER — Encounter: Payer: Self-pay | Admitting: *Deleted

## 2017-07-27 VITALS — BP 136/80 | Ht 60.0 in | Wt 159.2 lb

## 2017-07-27 DIAGNOSIS — E1159 Type 2 diabetes mellitus with other circulatory complications: Secondary | ICD-10-CM | POA: Diagnosis not present

## 2017-07-27 DIAGNOSIS — Z713 Dietary counseling and surveillance: Secondary | ICD-10-CM | POA: Diagnosis not present

## 2017-07-27 DIAGNOSIS — E1165 Type 2 diabetes mellitus with hyperglycemia: Secondary | ICD-10-CM

## 2017-07-27 DIAGNOSIS — Z794 Long term (current) use of insulin: Secondary | ICD-10-CM

## 2017-07-27 NOTE — Progress Notes (Signed)
Diabetes Self-Management Education  Visit Type: First/Initial  Appt. Start Time: 0900 Appt. End Time: 4562  07/27/2017  [name], identified by name and date of birth, is a 66 y.o. female with a diagnosis of Diabetes: Type 2.   ASSESSMENT  Blood pressure 136/80, height 5' (1.524 m), weight 159 lb 3.2 oz (72.2 kg). Body mass index is 31.09 kg/m.      Diabetes Self-Management Education - 07/27/17 1029      Visit Information   Visit Type First/Initial     Initial Visit   Diabetes Type Type 2   Are you currently following a meal plan? No   Are you taking your medications as prescribed? Yes   Date Diagnosed 25 years ago     Health Coping   How would you rate your overall health? Good     Psychosocial Assessment   Patient Belief/Attitude about Diabetes Other (comment)  "frustrated"   Self-care barriers None   Self-management support Doctor's office;Friends   Patient Concerns Nutrition/Meal planning;Glycemic Control;Weight Control;Healthy Lifestyle   Special Needs None   Preferred Learning Style Auditory;Visual;Hands on   Learning Readiness Ready   How often do you need to have someone help you when you read instructions, pamphlets, or other written materials from your doctor or pharmacy? 1 - Never   What is the last grade level you completed in school? 13 years     Pre-Education Assessment   Patient understands the diabetes disease and treatment process. Needs Review   Patient understands incorporating nutritional management into lifestyle. Needs Review   Patient undertands incorporating physical activity into lifestyle. Demonstrates understanding / competency   Patient understands using medications safely. Needs Review   Patient understands monitoring blood glucose, interpreting and using results Needs Review   Patient understands prevention, detection, and treatment of acute complications. Needs Review   Patient understands prevention, detection, and  treatment of chronic complications. Needs Review   Patient understands how to develop strategies to address psychosocial issues. Needs Review   Patient understands how to develop strategies to promote health/change behavior. Needs Review     Complications   Last HgB A1C per patient/outside source 12.4 %  07/09/17   How often do you check your blood sugar? > 4 times/day   Fasting Blood glucose range (mg/dL) 70-129;130-179;180-200  Pt reports FBG's usually 150-200's mg/dL but she had reading of 97 mg/dL today.    Postprandial Blood glucose range (mg/dL) --  Pt reports pre lunch in 300's mg/dL, pre-supper in 500's mg/dL and bedtime 300's mg/dL.    Number of hypoglycemic episodes per month 3 - usually in the middle of the night   Can you tell when your blood sugar is low? Yes   What do you do if your blood sugar is low? chews bubble gum   Have you had a dilated eye exam in the past 12 months? Yes   Have you had a dental exam in the past 12 months? Yes   Are you checking your feet? Yes   How many days per week are you checking your feet? 7     Dietary Intake   Breakfast eggs   Lunch soup and 1/2 sandwich   Snack (afternoon) crackers and cheese; jello with fruit   Dinner chicken, beef, fish - bread, potatoes, rice, pasta, peas, corn, beans, mac-n-cheese, green beans, cauliflower, broccoli   Snack (evening) same as afternoon with coffee   Beverage(s) water, fruit juice, sugar sweetened tea, sugar free lemonade  Exercise   Exercise Type Light (walking / raking leaves)   How many days per week to you exercise? 7   How many minutes per day do you exercise? 90   Total minutes per week of exercise 630     Patient Education   Previous Diabetes Education Yes - 25 years ago   Disease state  Definition of diabetes, type 1 and 2, and the diagnosis of diabetes;Explored patient's options for treatment of their diabetes   Nutrition management  Role of diet in the treatment of diabetes and the  relationship between the three main macronutrients and blood glucose level;Food label reading, portion sizes and measuring food.;Carbohydrate counting;Reviewed blood glucose goals for pre and post meals and how to evaluate the patients' food intake on their blood glucose level.  Pt reports she has 1:3 insulin/carb ratio   Physical activity and exercise  Role of exercise on diabetes management, blood pressure control and cardiac health.   Medications Taught/reviewed insulin injection, site rotation, insulin storage and needle disposal.;Reviewed patients medication for diabetes, action, purpose, timing of dose and side effects.   Monitoring Taught/discussed recording of test results and interpretation of SMBG.;Identified appropriate SMBG and/or A1C goals.   Acute complications Taught treatment of hypoglycemia - the 15 rule.;Discussed and identified patients' treatment of hyperglycemia.   Chronic complications Relationship between chronic complications and blood glucose control   Psychosocial adjustment Role of stress on diabetes;Identified and addressed patients feelings and concerns about diabetes     Individualized Goals (developed by patient)   Reducing Risk Improve blood sugars Lose weight Lead a healthier lifestyle Become more fit     Outcomes   Expected Outcomes Demonstrated interest in learning. Expect positive outcomes   Future DMSE 2 wks      Individualized Plan for Diabetes Self-Management Training:   Learning Objective:  Patient will have a greater understanding of diabetes self-management. Patient education plan is to attend individual and/or group sessions per assessed needs and concerns.   Plan:   Patient Instructions  Check blood sugars 4 x day before each meal and before bed every day Bring blood sugar records to the next appointment Exercise: Continue walking for    90  minutes  7 days a week Eat 3 meals day,   1-2  snacks a day Space meals 4-6 hours apart Avoid sugar  sweetened drinks (tea, juices) unless treating a low blood sugar Complete 3 Day Food Record and bring to next appt Get a Sharps container Carry fast acting glucose and a snack at all times Rotate injection sites Return for appointment on:  Tuesday November 13 at 1:30 pm - with Jaclyn Shaggy (dietitian)   Expected Outcomes:  Demonstrated interest in learning. Expect positive outcomes  Education material provided:  General Meal Planning Guidelines Simple Meal Plan 3 Day Food Record Symptoms, causes and treatments of Hypoglycemia  If problems or questions, patient to contact team via:  Johny Drilling, [place], [place], CDE [phone]  Future DSME appointment: 2 wks  August 14, 2017 with the dietitian

## 2017-07-27 NOTE — Patient Instructions (Addendum)
Check blood sugars 4 x day before each meal and before bed every day Bring blood sugar records to the next appointment  Exercise: Continue walking for    90  minutes  7 days a week  Eat 3 meals day,   1-2  snacks a day Space meals 4-6 hours apart Avoid sugar sweetened drinks (tea, juices) unless treating a low blood sugar Complete 3 Day Food Record and bring to next appt  Get a Sharps container Carry fast acting glucose and a snack at all times Rotate injection sites  Return for appointment on:  Tuesday November 13 at 1:30 pm - with Jaclyn Shaggy (dietitian)

## 2017-08-06 ENCOUNTER — Other Ambulatory Visit: Payer: Self-pay | Admitting: Family Medicine

## 2017-08-08 DIAGNOSIS — L821 Other seborrheic keratosis: Secondary | ICD-10-CM | POA: Diagnosis not present

## 2017-08-08 DIAGNOSIS — L820 Inflamed seborrheic keratosis: Secondary | ICD-10-CM | POA: Diagnosis not present

## 2017-08-08 DIAGNOSIS — L719 Rosacea, unspecified: Secondary | ICD-10-CM | POA: Diagnosis not present

## 2017-08-08 DIAGNOSIS — D1800 Hemangioma unspecified site: Secondary | ICD-10-CM | POA: Diagnosis not present

## 2017-08-08 DIAGNOSIS — L578 Other skin changes due to chronic exposure to nonionizing radiation: Secondary | ICD-10-CM | POA: Diagnosis not present

## 2017-08-08 DIAGNOSIS — Z85828 Personal history of other malignant neoplasm of skin: Secondary | ICD-10-CM | POA: Diagnosis not present

## 2017-08-08 DIAGNOSIS — L812 Freckles: Secondary | ICD-10-CM | POA: Diagnosis not present

## 2017-08-08 DIAGNOSIS — L853 Xerosis cutis: Secondary | ICD-10-CM | POA: Diagnosis not present

## 2017-08-08 DIAGNOSIS — I8393 Asymptomatic varicose veins of bilateral lower extremities: Secondary | ICD-10-CM | POA: Diagnosis not present

## 2017-08-08 DIAGNOSIS — Z1283 Encounter for screening for malignant neoplasm of skin: Secondary | ICD-10-CM | POA: Diagnosis not present

## 2017-08-14 ENCOUNTER — Encounter: Payer: Self-pay | Admitting: Dietician

## 2017-08-14 ENCOUNTER — Encounter: Payer: Medicare HMO | Attending: Family Medicine | Admitting: Dietician

## 2017-08-14 VITALS — BP 150/80 | Wt 161.2 lb

## 2017-08-14 DIAGNOSIS — Z794 Long term (current) use of insulin: Secondary | ICD-10-CM

## 2017-08-14 DIAGNOSIS — E1159 Type 2 diabetes mellitus with other circulatory complications: Secondary | ICD-10-CM | POA: Insufficient documentation

## 2017-08-14 DIAGNOSIS — Z713 Dietary counseling and surveillance: Secondary | ICD-10-CM | POA: Diagnosis not present

## 2017-08-14 DIAGNOSIS — E1165 Type 2 diabetes mellitus with hyperglycemia: Secondary | ICD-10-CM

## 2017-08-14 NOTE — Progress Notes (Signed)
Diabetes Self-Management Education  Visit Type:  Follow-up  Appt. Start Time: 1330 Appt. End Time: 1430  08/14/2017  [name], identified by name and date of birth, is a 66 y.o. female with a diagnosis of Diabetes:  .   ASSESSMENT  Weight 161 lb 3.2 oz (73.1 kg). BP: 150/80 Body mass index is 31.48 kg/m.   Diabetes Self-Management Education - 89/37/34 2876      Complications   Last HgB A1C per patient/outside source  12.4 %    How often do you check your blood sugar?  3-4 times/day    Fasting Blood glucose range (mg/dL)  >200    Postprandial Blood glucose range (mg/dL)  >200    Have you had a dilated eye exam in the past 12 months?  Yes    Have you had a dental exam in the past 12 months?  Yes    Are you checking your feet?  Yes    How many days per week are you checking your feet?  7      Dietary Intake   Breakfast  8-11:57WI- 1 egg, 1 slice toast with butter, coffee/creamer    Snack (morning)  4 oz yogurt or nuts or raw vegetables    Lunch  11:15- fruit, 1/2 cup cottage cheese or 1 slice cheese, crackers, 1/2 large apple    Snack (afternoon)  same as morning snack    Dinner  6:00pm- tuna, green peas, 1/2 c. pasta or chicken salad, fruit, crackers    Snack (evening)  8:00 same as other snacks    Beverage(s)  water, sugar free lemonade, coffee, unsweetened tea      Exercise   Exercise Type  Light (walking / raking leaves)    How many days per week to you exercise?  7    How many minutes per day do you exercise?  90    Total minutes per week of exercise  630      Patient Education   Nutrition management   Role of diet in the treatment of diabetes and the relationship between the three main macronutrients and blood glucose level;Carbohydrate counting;Food label reading, portion sizes and measuring food.;Meal options for control of blood glucose level and chronic complications.    Chronic complications  Relationship between chronic complications and blood glucose  control;Reviewed with patient heart disease, higher risk of, and prevention;Lipid levels, blood glucose control and heart disease      Individualized Goals (developed by patient)   Nutrition  Follow meal plan discussed    Medications  take my medication as prescribed    Monitoring   test my blood glucose as discussed      Post-Education Assessment   Patient understands the diabetes disease and treatment process.  Demonstrates understanding / competency    Patient understands incorporating nutritional management into lifestyle.  Demonstrates understanding / competency    Patient undertands incorporating physical activity into lifestyle.  Demonstrates understanding / competency    Patient understands using medications safely.  Demonstrates understanding / competency    Patient understands monitoring blood glucose, interpreting and using results  Demonstrates understanding / competency    Patient understands prevention, detection, and treatment of acute complications.  Demonstrates understanding / competency    Patient understands prevention, detection, and treatment of chronic complications.  Demonstrates understanding / competency    Patient understands how to develop strategies to promote health/change behavior.  Demonstrates understanding / competency      Outcomes   Program Status  Completed       Learning Objective:  Patient will have a greater understanding of diabetes self-management. Patient education plan is to attend individual and/or group sessions per assessed needs and concerns. Instruction: Instructed patient on a meal plan based on 1400 calories including carbohydrate counting and balance of carbohydrate, protein, and non-starchy vegetables. Also, instructed on recommendations for saturated fat, trans fat and sodium. Gave and reviewed sample menu.  Plan:   Patient Instructions  Continue with meal pattern of 3 meals spaced 4-5 hours apart. Include a serving of carbohydrate  between meals if hungry. Balance meals with 1-3 oz protein, 2-3 servings of carbohydrate and non-starchy vegetables. Read labels for carbohydrate, sodium, saturated fat and trans fat. Split premier protein and drink half between breakfast and lunch and between lunch and dinner.    Expected Outcomes:  Demonstrated interest in learning. Expect positive outcomes  Education material provided:  Planning a Balanced Meal If problems or questions, patient to contact team via:  Thad Ranger  [phone] Future DSME appointment: - PRN

## 2017-08-14 NOTE — Patient Instructions (Signed)
Continue with meal pattern of 3 meals spaced 4-5 hours apart. Include a serving of carbohydrate between meals if hungry. Balance meals with 1-3 oz protein, 2-3 servings of carbohydrate and non-starchy vegetables. Read labels for carbohydrate, sodium, saturated fat and trans fat. Split premier protein and drink half between breakfast and lunch and between lunch and dinner.

## 2017-08-15 DIAGNOSIS — R69 Illness, unspecified: Secondary | ICD-10-CM | POA: Diagnosis not present

## 2017-08-20 ENCOUNTER — Telehealth: Payer: Self-pay | Admitting: Dietician

## 2017-08-20 NOTE — Telephone Encounter (Signed)
Called pt-no answer-left message on voicemail for pt to call back

## 2017-09-11 ENCOUNTER — Encounter: Payer: Medicare HMO | Admitting: Internal Medicine

## 2017-09-21 ENCOUNTER — Ambulatory Visit (AMBULATORY_SURGERY_CENTER): Payer: Self-pay | Admitting: *Deleted

## 2017-09-21 ENCOUNTER — Other Ambulatory Visit: Payer: Self-pay

## 2017-09-21 VITALS — Ht 62.5 in | Wt 152.0 lb

## 2017-09-21 DIAGNOSIS — Z1211 Encounter for screening for malignant neoplasm of colon: Secondary | ICD-10-CM

## 2017-09-21 NOTE — Progress Notes (Signed)
No egg or soy allergy known to patient  No issues with past sedation with any surgeries  or procedures, no intubation problems  No diet pills per patient No home 02 use per patient  No blood thinners per patient  Pt denies issues with constipation  No A fib or A flutter  EMMI video sent to pt's e mail - pt declined cause has no e mail  Rectal bleeding in June 2018- was on the outside- did not see mixed in stool- was on Tp with wiping- no episodes since

## 2017-10-12 ENCOUNTER — Encounter: Payer: Self-pay | Admitting: Internal Medicine

## 2017-10-12 ENCOUNTER — Ambulatory Visit (AMBULATORY_SURGERY_CENTER): Payer: Medicare HMO | Admitting: Internal Medicine

## 2017-10-12 ENCOUNTER — Other Ambulatory Visit: Payer: Self-pay

## 2017-10-12 VITALS — BP 150/84 | HR 88 | Temp 97.5°F | Resp 16 | Ht 62.0 in | Wt 152.0 lb

## 2017-10-12 DIAGNOSIS — E119 Type 2 diabetes mellitus without complications: Secondary | ICD-10-CM | POA: Diagnosis not present

## 2017-10-12 DIAGNOSIS — Z1212 Encounter for screening for malignant neoplasm of rectum: Secondary | ICD-10-CM

## 2017-10-12 DIAGNOSIS — Z1211 Encounter for screening for malignant neoplasm of colon: Secondary | ICD-10-CM

## 2017-10-12 DIAGNOSIS — K621 Rectal polyp: Secondary | ICD-10-CM | POA: Diagnosis not present

## 2017-10-12 DIAGNOSIS — I10 Essential (primary) hypertension: Secondary | ICD-10-CM | POA: Diagnosis not present

## 2017-10-12 DIAGNOSIS — K5090 Crohn's disease, unspecified, without complications: Secondary | ICD-10-CM | POA: Diagnosis not present

## 2017-10-12 DIAGNOSIS — D128 Benign neoplasm of rectum: Secondary | ICD-10-CM

## 2017-10-12 DIAGNOSIS — I2510 Atherosclerotic heart disease of native coronary artery without angina pectoris: Secondary | ICD-10-CM | POA: Diagnosis not present

## 2017-10-12 MED ORDER — SODIUM CHLORIDE 0.9 % IV SOLN: 500.0000 mL | Freq: Once | INTRAVENOUS | Status: DC

## 2017-10-12 NOTE — Progress Notes (Deleted)
Pt's states no medical or surgical changes since previsit or office visit.Patient consents to observer being present for procedure.

## 2017-10-12 NOTE — Patient Instructions (Addendum)
I found and removed one tiny polyp. You also have a condition called diverticulosis - common and not usually a problem. Please read the handout provided.  I will let you know pathology results and when to have another routine colonoscopy by mail and/or My Chart.  I appreciate the opportunity to care for you. Gatha Mayer, MD, [facility]  *Handouts given to patient on polyps and diverticulosis*  YOU HAD AN ENDOSCOPIC PROCEDURE TODAY AT [place]:   Refer to the procedure report that was given to you for any specific questions about what was found during the examination.  If the procedure report does not answer your questions, please call your gastroenterologist to clarify.  If you requested that your care partner not be given the details of your procedure findings, then the procedure report has been included in a sealed envelope for you to review at your convenience later.  YOU SHOULD EXPECT: Some feelings of bloating in the abdomen. Passage of more gas than usual.  Walking can help get rid of the air that was put into your GI tract during the procedure and reduce the bloating. If you had a lower endoscopy (such as a colonoscopy or flexible sigmoidoscopy) you may notice spotting of blood in your stool or on the toilet paper. If you underwent a bowel prep for your procedure, you may not have a normal bowel movement for a few days.  Please Note:  You might notice some irritation and congestion in your nose or some drainage.  This is from the oxygen used during your procedure.  There is no need for concern and it should clear up in a day or so.  SYMPTOMS TO REPORT IMMEDIATELY:   Following lower endoscopy (colonoscopy or flexible sigmoidoscopy):  Excessive amounts of blood in the stool  Significant tenderness or worsening of abdominal pains  Swelling of the abdomen that is new, acute  Fever of 100F or higher    For urgent or emergent issues, a gastroenterologist can be  reached at any hour by calling [phone].   DIET:  We do recommend a small meal at first, but then you may proceed to your regular diet.  Drink plenty of fluids but you should avoid alcoholic beverages for 24 hours.  ACTIVITY:  You should plan to take it easy for the rest of today and you should NOT DRIVE or use heavy machinery until tomorrow (because of the sedation medicines used during the test).    FOLLOW UP: Our staff will call the number listed on your records the next business day following your procedure to check on you and address any questions or concerns that you may have regarding the information given to you following your procedure. If we do not reach you, we will leave a message.  However, if you are feeling well and you are not experiencing any problems, there is no need to return our call.  We will assume that you have returned to your regular daily activities without incident.  If any biopsies were taken you will be contacted by phone or by letter within the next 1-3 weeks.  Please call us at [phone] if you have not heard about the biopsies in 3 weeks.    SIGNATURES/CONFIDENTIALITY: You and/or your care partner have signed paperwork which will be entered into your electronic medical record.  These signatures attest to the fact that that the information above on your After Visit Summary has been reviewed and is understood.  Full responsibility of the confidentiality of this discharge information lies with you and/or your care-partner.

## 2017-10-12 NOTE — Progress Notes (Signed)
To recovery, report to RN, VSS. 

## 2017-10-12 NOTE — Progress Notes (Signed)
Called to room to assist during endoscopic procedure.  Patient ID and intended procedure confirmed with present staff. Received instructions for my participation in the procedure from the performing physician.  

## 2017-10-12 NOTE — Progress Notes (Signed)
Patient consents to observer being present for procedure.   Pt's states no medical or surgical changes since previsit or office visit.

## 2017-10-12 NOTE — Op Note (Signed)
[place] Patient Name: Kristy [name] Procedure Date: 10/12/2017 9:15 AM MRN: [id number] Endoscopist: Gatha Mayer , MD Age: 67 Referring MD:  Date of Birth: [birth year] Gender: Female Account #: [id number] Procedure:                Colonoscopy Indications:              Screening for colorectal malignant neoplasm, Last                            colonoscopy: 2008 Medicines:                Propofol per Anesthesia, Monitored Anesthesia Care Procedure:                Pre-Anesthesia Assessment:                           - Prior to the procedure, a History and Physical                            was performed, and patient medications and                            allergies were reviewed. The patient's tolerance of                            previous anesthesia was also reviewed. The risks                            and benefits of the procedure and the sedation                            options and risks were discussed with the patient.                            All questions were answered, and informed consent                            was obtained. Prior Anticoagulants: The patient has                            taken no previous anticoagulant or antiplatelet                            agents. ASA Grade Assessment: II - A patient with                            mild systemic disease. After reviewing the risks                            and benefits, the patient was deemed in                            satisfactory condition to undergo the procedure.  After obtaining informed consent, the colonoscope                            was passed under direct vision. Throughout the                            procedure, the patient's blood pressure, pulse, and                            oxygen saturations were monitored continuously. The                            Model CF-HQ190L [id number]) scope was introduced                            through the anus  and advanced to the the cecum,                            identified by appendiceal orifice and ileocecal                            valve. The colonoscopy was somewhat difficult due                            to restricted mobility of the colon. Successful                            completion of the procedure was aided by using                            manual pressure. The patient tolerated the                            procedure well. The quality of the bowel                            preparation was excellent. The bowel preparation                            used was Miralax. The ileocecal valve, appendiceal                            orifice, and rectum were photographed. Scope In: 9:24:15 AM Scope Out: 9:43:17 AM Scope Withdrawal Time: 0 hours 10 minutes 41 seconds  Total Procedure Duration: 0 hours 19 minutes 2 seconds  Findings:                 The perianal and digital rectal examinations were                            normal.                           A diminutive polyp was found in the rectum. The  polyp was sessile. The polyp was removed with a                            cold snare. Resection and retrieval were complete.                            Verification of patient identification for the                            specimen was done. Estimated blood loss was minimal.                           Multiple diverticula were found in the sigmoid                            colon. Fixed and somewhat narrowed sigmoid                           The exam was otherwise without abnormality on                            direct and retroflexion views. Complications:            No immediate complications. Estimated Blood Loss:     Estimated blood loss was minimal. Impression:               - One diminutive polyp in the rectum, removed with                            a cold snare. Resected and retrieved.                           - Diverticulosis in the  sigmoid colon.                           - The examination was otherwise normal on direct                            and retroflexion views. Recommendation:           - Patient has a contact number available for                            emergencies. The signs and symptoms of potential                            delayed complications were discussed with the                            patient. Return to normal activities tomorrow.                            Written discharge instructions were provided to the  patient.                           - Resume previous diet.                           - Continue present medications.                           - Repeat colonoscopy is recommended. The                            colonoscopy date will be determined after pathology                            results from today's exam become available for                            review.                           - USE PEDIATRIC COLONOSCOPE NEXT TIME Gatha Mayer, MD 10/12/2017 9:48:55 AM This report has been signed electronically.

## 2017-10-15 ENCOUNTER — Telehealth: Payer: Self-pay | Admitting: *Deleted

## 2017-10-15 NOTE — Telephone Encounter (Signed)
  Follow up Call-  Call back number 10/12/2017  Post procedure Call Back phone  # [phone]  Permission to leave phone message Yes  Some recent data might be hidden     Patient questions:  Do you have a fever, pain , or abdominal swelling? No. Pain Score  0 *  Have you tolerated food without any problems? Yes.    Have you been able to return to your normal activities? Yes.    Do you have any questions about your discharge instructions: Diet   No. Medications  No. Follow up visit  No.  Do you have questions or concerns about your Care? Yes.    Actions: * If pain score is 4 or above: No action needed, pain <4.

## 2017-10-16 ENCOUNTER — Other Ambulatory Visit: Payer: Self-pay | Admitting: Family Medicine

## 2017-10-16 ENCOUNTER — Encounter: Payer: Self-pay | Admitting: Internal Medicine

## 2017-10-16 DIAGNOSIS — R69 Illness, unspecified: Secondary | ICD-10-CM | POA: Diagnosis not present

## 2017-10-19 DIAGNOSIS — E113292 Type 2 diabetes mellitus with mild nonproliferative diabetic retinopathy without macular edema, left eye: Secondary | ICD-10-CM | POA: Diagnosis not present

## 2017-10-19 DIAGNOSIS — H2513 Age-related nuclear cataract, bilateral: Secondary | ICD-10-CM | POA: Diagnosis not present

## 2017-10-19 LAB — HM DIABETES EYE EXAM

## 2017-11-02 ENCOUNTER — Other Ambulatory Visit: Payer: Self-pay | Admitting: *Deleted

## 2017-11-02 MED ORDER — FLUOXETINE HCL 20 MG PO CAPS: 20.0000 mg | ORAL_CAPSULE | Freq: Every day | ORAL | 1 refills | Status: DC

## 2017-11-02 MED ORDER — LISINOPRIL 40 MG PO TABS: 40.0000 mg | ORAL_TABLET | Freq: Every day | ORAL | 1 refills | Status: DC

## 2017-11-08 NOTE — Progress Notes (Signed)
Cardiology Office Note  Date:  11/12/2017   ID:  Kristy [name], DOB 1950-12-05, MRN [id number]  PCP:  Owens Loffler, MD   Chief Complaint  Patient presents with  . othe r    6 month follow up. Meds reviewed by the pt. verbally. "doing well."     HPI:  67 yo with HTN,  diabetes, Hemoglobin A1c of 12.4 GI disease/IBS,  Hyperlipidemia, LDL 168 h/o smoking,  Previous cardiac catheterization in 2009 showed mild luminal irregularities.   Crohn's disease. Normal EF in 07/2009 chronic tachycardia presents for followup of her hyperlipidemia, coronary artery disease and tachycardia  Not working anymore Gout problems Taking colchicine Continue tachycardia, tolerating verapamil Heart rate continues to run 100 at least, does not want more medication  Difficultly with her diabetes, does not want to go to [facility] for further workup Taking high-dose insulin but does not seem to help Reports that she follows a very strict diet Weight up and down  Reports that she has some swelling in her hands would like a water pill/diuretic  Previously tried metoprolol succinate 50 but had intolerance Tried coreg , again with intolerance  Husband passed away from complications of severe underlying lung disease, fibrosis   EKG personally reviewed by myself on todays visit Shows sinus tachycardia rate 103 bpm left bundle branch block  Other past medical history reviewed with her Denies any complications from her diabetes LDL 170, up from 22 eight years ago   retired August 2017 Previously reported that "none of her diabetes medications work, on "300 units of insulin, does not work"  Last echo in 10/10 showed EF 50-55% with dysynnergy due to left bundle branch block.    PMH:   has a past medical history of Allergic rhinitis, Allergy, Cardiomyopathy, Cataract, Chronic systolic congestive heart failure ([place]) (01/11/2009), Crohn's disease ([place]) (10/13/2013), Diabetes mellitus, Diverticulosis,  GERD (gastroesophageal reflux disease), Gout, Hyperlipidemia, Hypertension, IBS (irritable bowel syndrome), Left bundle branch block, Neuromuscular disorder ([place]), Osteoarthritis, and Symptomatic cholelithiasis.  PSH:    Past Surgical History:  Procedure Laterality Date  . COLONOSCOPY    . DILATION AND CURETTAGE OF UTERUS    . SHOULDER SURGERY     15 + yrs ago   . SKIN SURGERY     nose   . VAGINAL HYSTERECTOMY      Current Outpatient Medications  Medication Sig Dispense Refill  . COLCRYS 0.6 MG tablet TAKE 1 TABLET EVERY DAY 30 tablet 3  . FLUoxetine (PROZAC) 20 MG capsule Take 1 capsule (20 mg total) by mouth daily. 90 capsule 1  . insulin regular human CONCENTRATED (HUMULIN R) 500 UNIT/ML injection Inject 0.24 mLs (120 Units total) into the skin 3 (three) times daily with meals. (Patient taking differently: Inject 100 Units into the skin 3 (three) times daily with meals. ) 60 mL 3  . Insulin Syringe-Needle U-100 (B-D INS SYRINGE 0.5CC/31GX5/16) 31G X 5/16" 0.5 ML MISC USE AS DIRECTED THREE TIMES A DAY 100 each 11  . lisinopril (PRINIVIL,ZESTRIL) 40 MG tablet Take 1 tablet (40 mg total) by mouth daily. 90 tablet 1  . metroNIDAZOLE (METROCREAM) 0.75 % cream Apply 2 (two) times daily topically.    . NONFORMULARY OR COMPOUNDED ITEM See pharmacy note (Patient taking differently: Apply 1 application topically at bedtime. See pharmacy note) 120 each 2  . ONE TOUCH ULTRA TEST test strip USE AS PER PACKAGE INSTRUCTIONS CHECK SUGARS UP TO-FOUR TIMES A DAY OR AS PRESCRIBED 150 each 11  . Probiotic Product (  ALIGN) 4 MG CAPS Take 1 capsule by mouth daily.     . traMADol (ULTRAM) 50 MG tablet TAKE 1 TABLET EVERY 6 HOURS AS NEEDED 90 tablet 3  . verapamil (VERELAN PM) 180 MG 24 hr capsule Take 1 capsule (180 mg total) by mouth at bedtime. 30 capsule 11   No current facility-administered medications for this visit.      Allergies:   Fish oil; Glimepiride; Guanfacine hcl; and Rosiglitazone    Social History:  The patient  reports that she quit smoking about 20 years ago. Her smoking use included cigarettes. She has a 22.50 pack-year smoking history. she has never used smokeless tobacco. She reports that she does not drink alcohol or use drugs.   Family History:   family history includes Hypertension in her father.    Review of Systems: Review of Systems  Constitutional: Negative.   Respiratory: Negative.   Cardiovascular: Negative.   Gastrointestinal: Negative.   Musculoskeletal: Positive for joint pain.  Neurological: Negative.   Psychiatric/Behavioral: Negative.   All other systems reviewed and are negative.    PHYSICAL EXAM: VS:  BP 130/70 (BP Location: Left Arm, Patient Position: Sitting, Cuff Size: Normal)   Pulse (!) 103   Ht 5' 4.5" (1.638 m)   Wt 158 lb 4 oz (71.8 kg)   BMI 26.74 kg/m  , BMI Body mass index is 26.74 kg/m. GEN: Well nourished, well developed, in no acute distress  HEENT: normal  Neck: no JVD, carotid bruits, or masses Cardiac: Regular rhythm, tachycardia,; no murmurs, rubs, or gallops,no edema  Respiratory:  clear to auscultation bilaterally, normal work of breathing GI: soft, nontender, nondistended, + BS MS: no deformity or atrophy  Skin: warm and dry, no rash Neuro:  Strength and sensation are intact Psych: euthymic mood, full affect    Recent Labs: 01/10/2017: TSH 1.13 07/09/2017: ALT 46; BUN 13; Creatinine, Ser 0.79; Hemoglobin 14.6; Platelets 184.0; Potassium 3.9; Sodium 140    Lipid Panel Lab Results  Component Value Date   CHOL 239 (H) 07/09/2017   HDL 35.60 (L) 07/09/2017   LDLCALC 75 04/26/2009   TRIG 201.0 (H) 07/09/2017      Wt Readings from Last 3 Encounters:  11/12/17 158 lb 4 oz (71.8 kg)  10/12/17 152 lb (68.9 kg)  09/21/17 152 lb (68.9 kg)       ASSESSMENT AND PLAN:   Essential hypertension - Plan: EKG 12-Lead Continue verapamil extended release 180 mg daily Continue ACE inhibitor Blood pressure  stable  Sinus tachycardia - Plan: EKG 12-Lead She does not want a beta blocker Previous intolerance of beta-blockers She is tolerating verapamil.  She does not want a short acting pill and does not want a higher dose  LBBB (left bundle branch block) - Plan: EKG 12-Lead No change  Type 2 diabetes mellitus with vascular disease ([place]) Followed by endocrinology and primary care  reluctant to seek Second opinion at [facility] Hemoglobin A1c down to 12  Other hyperlipidemia Previously indicated she does not want a statin Recommended improved diabetes control which will help her cholesterol numbers LDL 160s  CAD  mild disease on prior catheterization 2009 Stressed importance of improving her risk factors to minimize progression Again discussed with her   Total encounter time more than 25 minutes  Greater than 50% was spent in counseling and coordination of care with the patient  Disposition:   F/U  12 months as needed   Orders Placed This Encounter  Procedures  . EKG 12-Lead  Signed, Esmond Plants, M.D., Ph.D. 11/12/2017  [place], [place]

## 2017-11-12 ENCOUNTER — Encounter: Payer: Self-pay | Admitting: Cardiovascular Disease

## 2017-11-12 ENCOUNTER — Ambulatory Visit: Payer: Medicare HMO | Admitting: Cardiovascular Disease

## 2017-11-12 VITALS — BP 130/70 | HR 103 | Ht 64.5 in | Wt 158.2 lb

## 2017-11-12 DIAGNOSIS — I10 Essential (primary) hypertension: Secondary | ICD-10-CM | POA: Diagnosis not present

## 2017-11-12 DIAGNOSIS — E1159 Type 2 diabetes mellitus with other circulatory complications: Secondary | ICD-10-CM

## 2017-11-12 DIAGNOSIS — E782 Mixed hyperlipidemia: Secondary | ICD-10-CM | POA: Diagnosis not present

## 2017-11-12 DIAGNOSIS — I2510 Atherosclerotic heart disease of native coronary artery without angina pectoris: Secondary | ICD-10-CM

## 2017-11-12 NOTE — Patient Instructions (Addendum)
Medication Instructions:   No medication changes made  Labwork:  No new labs needed  Testing/Procedures:  No further testing at this time   Follow-Up: It was a pleasure seeing you in the office today. Please call us if you have new issues that need to be addressed before your next appt.  [phone]  Your physician wants you to follow-up in: 12 months as needed You will receive a reminder letter in the mail two months in advance. If you don't receive a letter, please call our office to schedule the follow-up appointment.  If you need a refill on your cardiac medications before your next appointment, please call your pharmacy.

## 2017-11-13 ENCOUNTER — Encounter: Payer: Self-pay | Admitting: Family Medicine

## 2017-11-23 ENCOUNTER — Ambulatory Visit: Payer: Medicare HMO | Admitting: Internal Medicine

## 2017-11-23 NOTE — Progress Notes (Deleted)
Subjective:    Patient ID: Kristy [name], female    DOB: 1951-06-19, 67 y.o.   MRN: [id number]  HPI  Pt presents to the clinic today with c/o left leg pain. This started. The pain is worse with ambulation.  Review of Systems      Past Medical History:  Diagnosis Date  . Allergic rhinitis   . Allergy   . Cardiomyopathy    mild nonischemic  . Cataract    mild   . Chronic systolic congestive heart failure ([place]) 01/11/2009   Qualifier: Diagnosis of  By: Silvio Pate MD, [facility]   . Crohn's disease ([place]) 10/13/2013  . Diabetes mellitus   . Diverticulosis   . GERD (gastroesophageal reflux disease)   . Gout   . Hyperlipidemia   . Hypertension   . IBS (irritable bowel syndrome)   . Left bundle branch block   . Neuromuscular disorder (HCC)    neuropathy  . Osteoarthritis   . Symptomatic cholelithiasis     Current Outpatient Medications  Medication Sig Dispense Refill  . COLCRYS 0.6 MG tablet TAKE 1 TABLET EVERY DAY 30 tablet 3  . FLUoxetine (PROZAC) 20 MG capsule Take 1 capsule (20 mg total) by mouth daily. 90 capsule 1  . insulin regular human CONCENTRATED (HUMULIN R) 500 UNIT/ML injection Inject 0.24 mLs (120 Units total) into the skin 3 (three) times daily with meals. (Patient taking differently: Inject 100 Units into the skin 3 (three) times daily with meals. ) 60 mL 3  . Insulin Syringe-Needle U-100 (B-D INS SYRINGE 0.5CC/31GX5/16) 31G X 5/16" 0.5 ML MISC USE AS DIRECTED THREE TIMES A DAY 100 each 11  . lisinopril (PRINIVIL,ZESTRIL) 40 MG tablet Take 1 tablet (40 mg total) by mouth daily. 90 tablet 1  . metroNIDAZOLE (METROCREAM) 0.75 % cream Apply 2 (two) times daily topically.    . NONFORMULARY OR COMPOUNDED ITEM See pharmacy note (Patient taking differently: Apply 1 application topically at bedtime. See pharmacy note) 120 each 2  . ONE TOUCH ULTRA TEST test strip USE AS PER PACKAGE INSTRUCTIONS CHECK SUGARS UP TO-FOUR TIMES A DAY OR AS PRESCRIBED 150 each 11  .  Probiotic Product (ALIGN) 4 MG CAPS Take 1 capsule by mouth daily.     . traMADol (ULTRAM) 50 MG tablet TAKE 1 TABLET EVERY 6 HOURS AS NEEDED 90 tablet 3  . verapamil (VERELAN PM) 180 MG 24 hr capsule Take 1 capsule (180 mg total) by mouth at bedtime. 30 capsule 11   No current facility-administered medications for this visit.     Allergies  Allergen Reactions  . Fish Oil Other (See Comments)    Gout  . Glimepiride     REACTION: hypoglycemia  . Guanfacine Hcl     REACTION: unspecified  . Rosiglitazone Other (See Comments)    CHF    Family History  Problem Relation Age of Onset  . Hypertension Father   . Colon cancer Neg Hx   . Colon polyps Neg Hx   . Rectal cancer Neg Hx   . Stomach cancer Neg Hx     Social History   Socioeconomic History  . Marital status: Married    Spouse name: Not on file  . Number of children: Not on file  . Years of education: Not on file  . Highest education level: Not on file  Social Needs  . Financial resource strain: Not on file  . Food insecurity - worry: Not on file  . Food insecurity -  inability: Not on file  . Transportation needs - medical: Not on file  . Transportation needs - non-medical: Not on file  Occupational History  . Not on file  Tobacco Use  . Smoking status: Former Smoker    Packs/day: 0.75    Years: 30.00    Pack years: 22.50    Types: Cigarettes    Last attempt to quit: 06/06/1997    Years since quitting: 20.4  . Smokeless tobacco: Never Used  Substance and Sexual Activity  . Alcohol use: No  . Drug use: No  . Sexual activity: Not on file  Other Topics Concern  . Not on file  Social History Narrative  . Not on file     Constitutional: Denies fever, malaise, fatigue, headache or abrupt weight changes.  HEENT: Denies eye pain, eye redness, ear pain, ringing in the ears, wax buildup, runny nose, nasal congestion, bloody nose, or sore throat. Respiratory: Denies difficulty breathing, shortness of breath, cough  or sputum production.   Cardiovascular: Denies chest pain, chest tightness, palpitations or swelling in the hands or feet.  Gastrointestinal: Denies abdominal pain, bloating, constipation, diarrhea or blood in the stool.  GU: Denies urgency, frequency, pain with urination, burning sensation, blood in urine, odor or discharge. Musculoskeletal: Pt reports left leg pain. Denies decrease in range of motion, difficulty with gait, muscle pain or joint pain and swelling.  Skin: Denies redness, rashes, lesions or ulcercations.  Neurological: Denies dizziness, difficulty with memory, difficulty with speech or problems with balance and coordination.  Psych: Denies anxiety, depression, SI/HI.  No other specific complaints in a complete review of systems (except as listed in HPI above).  Objective:   Physical Exam        Assessment & Plan:

## 2017-11-26 ENCOUNTER — Encounter: Payer: Self-pay | Admitting: Internal Medicine

## 2017-11-26 ENCOUNTER — Ambulatory Visit (INDEPENDENT_AMBULATORY_CARE_PROVIDER_SITE_OTHER)
Admission: RE | Admit: 2017-11-26 | Discharge: 2017-11-26 | Disposition: A | Discharge status: Home / Self Care | Payer: Medicare HMO | Source: Ambulatory Visit | Attending: Internal Medicine | Admitting: Internal Medicine

## 2017-11-26 ENCOUNTER — Ambulatory Visit (INDEPENDENT_AMBULATORY_CARE_PROVIDER_SITE_OTHER): Payer: Medicare HMO | Admitting: Internal Medicine

## 2017-11-26 VITALS — BP 138/80 | HR 116 | Temp 97.4°F | Ht 64.5 in | Wt 157.8 lb

## 2017-11-26 DIAGNOSIS — M25562 Pain in left knee: Secondary | ICD-10-CM | POA: Diagnosis not present

## 2017-11-26 DIAGNOSIS — M179 Osteoarthritis of knee, unspecified: Secondary | ICD-10-CM | POA: Diagnosis not present

## 2017-11-26 NOTE — Patient Instructions (Signed)

## 2017-11-26 NOTE — Progress Notes (Signed)
Subjective:    Patient ID: Kristy [name], female    DOB: January 02, 1951, 67 y.o.   MRN: [id number]  HPI  Pt presents to the clinic today with c/o left knee pain. This started 1 month ago. She describes the pain as sore and achy, but can be sharp and stabbing with ambulation. The pain does wake her up at night. She denies numbness or tingling in her leg, but she does feel like her leg may lock up or give out on her. The knee was swollen, red and warm in the beginning. That is better now. She thought it may be gout, which she has a history of. Although the redness, swelling and warmth improved, the pain has still persisted. She denies injury to the area. She has not taken anything OTC for her symptoms. She is wearing a knee brace on her left knee, tried elevation and knee exercises with minimal improvement.  Review of Systems      Past Medical History:  Diagnosis Date  . Allergic rhinitis   . Allergy   . Cardiomyopathy    mild nonischemic  . Cataract    mild   . Chronic systolic congestive heart failure ([place]) 01/11/2009   Qualifier: Diagnosis of  By: Silvio Pate MD, [facility]   . Crohn's disease ([place]) 10/13/2013  . Diabetes mellitus   . Diverticulosis   . GERD (gastroesophageal reflux disease)   . Gout   . Hyperlipidemia   . Hypertension   . IBS (irritable bowel syndrome)   . Left bundle branch block   . Neuromuscular disorder (HCC)    neuropathy  . Osteoarthritis   . Symptomatic cholelithiasis     Current Outpatient Medications  Medication Sig Dispense Refill  . COLCRYS 0.6 MG tablet TAKE 1 TABLET EVERY DAY 30 tablet 3  . FLUoxetine (PROZAC) 20 MG capsule Take 1 capsule (20 mg total) by mouth daily. 90 capsule 1  . insulin regular human CONCENTRATED (HUMULIN R) 500 UNIT/ML injection Inject 0.24 mLs (120 Units total) into the skin 3 (three) times daily with meals. (Patient taking differently: Inject 100 Units into the skin 3 (three) times daily with meals. ) 60 mL 3  . Insulin  Syringe-Needle U-100 (B-D INS SYRINGE 0.5CC/31GX5/16) 31G X 5/16" 0.5 ML MISC USE AS DIRECTED THREE TIMES A DAY 100 each 11  . lisinopril (PRINIVIL,ZESTRIL) 40 MG tablet Take 1 tablet (40 mg total) by mouth daily. 90 tablet 1  . metroNIDAZOLE (METROCREAM) 0.75 % cream Apply 2 (two) times daily topically.    . NONFORMULARY OR COMPOUNDED ITEM See pharmacy note (Patient taking differently: Apply 1 application topically at bedtime. See pharmacy note) 120 each 2  . ONE TOUCH ULTRA TEST test strip USE AS PER PACKAGE INSTRUCTIONS CHECK SUGARS UP TO-FOUR TIMES A DAY OR AS PRESCRIBED 150 each 11  . Probiotic Product (ALIGN) 4 MG CAPS Take 1 capsule by mouth daily.     . traMADol (ULTRAM) 50 MG tablet TAKE 1 TABLET EVERY 6 HOURS AS NEEDED 90 tablet 3  . verapamil (VERELAN PM) 180 MG 24 hr capsule Take 1 capsule (180 mg total) by mouth at bedtime. 30 capsule 11   No current facility-administered medications for this visit.     Allergies  Allergen Reactions  . Fish Oil Other (See Comments)    Gout  . Glimepiride     REACTION: hypoglycemia  . Guanfacine Hcl     REACTION: unspecified  . Rosiglitazone Other (See Comments)    CHF  Family History  Problem Relation Age of Onset  . Hypertension Father   . Colon cancer Neg Hx   . Colon polyps Neg Hx   . Rectal cancer Neg Hx   . Stomach cancer Neg Hx     Social History   Socioeconomic History  . Marital status: Married    Spouse name: Not on file  . Number of children: Not on file  . Years of education: Not on file  . Highest education level: Not on file  Social Needs  . Financial resource strain: Not on file  . Food insecurity - worry: Not on file  . Food insecurity - inability: Not on file  . Transportation needs - medical: Not on file  . Transportation needs - non-medical: Not on file  Occupational History  . Not on file  Tobacco Use  . Smoking status: Former Smoker    Packs/day: 0.75    Years: 30.00    Pack years: 22.50     Types: Cigarettes    Last attempt to quit: 06/06/1997    Years since quitting: 20.4  . Smokeless tobacco: Never Used  Substance and Sexual Activity  . Alcohol use: No  . Drug use: No  . Sexual activity: Not on file  Other Topics Concern  . Not on file  Social History Narrative  . Not on file     Constitutional: Denies fever, malaise, fatigue, headache or abrupt weight changes.  Musculoskeletal: Pt reports left kneee pain. Denies difficulty with gait, muscle pain or joint swelling.    No other specific complaints in a complete review of systems (except as listed in HPI above).  Objective:   Physical Exam   BP 138/80 (BP Location: Left Arm, Patient Position: Sitting, Cuff Size: Normal)   Pulse (!) 116   Temp (!) 97.4 F (36.3 C) (Oral)   Ht 5' 4.5" (1.638 m)   Wt 157 lb 12.8 oz (71.6 kg)   SpO2 93%   BMI 26.67 kg/m  Wt Readings from Last 3 Encounters:  11/26/17 157 lb 12.8 oz (71.6 kg)  11/12/17 158 lb 4 oz (71.8 kg)  10/12/17 152 lb (68.9 kg)    General: Appears her stated age, in NAD. Skin: Warm, dry and intact. No swelling, redness or warmth noted. Musculoskeletal: Normal flexion. Pain with full extension. No joint swelling noted. Pain with palpation over the lateral meniscus. Positive [place]. Limps with normal gait.  BMET    Component Value Date/Time   NA 140 07/09/2017 1540   K 3.9 07/09/2017 1540   CL 101 07/09/2017 1540   CO2 31 07/09/2017 1540   GLUCOSE 184 (H) 07/09/2017 1540   BUN 13 07/09/2017 1540   CREATININE 0.79 07/09/2017 1540   CREATININE 0.72 07/27/2014 0902   CALCIUM 10.1 07/09/2017 1540   GFRNONAA 89 07/27/2014 0902   GFRAA >89 07/27/2014 0902    Lipid Panel     Component Value Date/Time   CHOL 239 (H) 07/09/2017 1540   TRIG 201.0 (H) 07/09/2017 1540   HDL 35.60 (L) 07/09/2017 1540   CHOLHDL 7 07/09/2017 1540   VLDL 40.2 (H) 07/09/2017 1540   LDLCALC 75 04/26/2009 2202    CBC    Component Value Date/Time   WBC 9.9 07/09/2017  1540   RBC 4.90 07/09/2017 1540   HGB 14.6 07/09/2017 1540   HCT 45.3 07/09/2017 1540   PLT 184.0 07/09/2017 1540   MCV 92.4 07/09/2017 1540   MCHC 32.2 07/09/2017 1540   RDW 14.1  07/09/2017 1540   LYMPHSABS 3.9 07/09/2017 1540   MONOABS 1.0 07/09/2017 1540   EOSABS 0.2 07/09/2017 1540   BASOSABS 0.1 07/09/2017 1540    Hgb A1C Lab Results  Component Value Date   HGBA1C 12.4 (H) 07/09/2017           Assessment & Plan:   Left Knee Pain:  Will obtain xray of left knee today Encouraged NSAID's, compression, rest and elevation She declines PT today May need MRI, but will await xray results  Will follow up after xray, return precautions discussed Webb Silversmith, NP

## 2017-12-18 DIAGNOSIS — R69 Illness, unspecified: Secondary | ICD-10-CM | POA: Diagnosis not present

## 2018-01-16 ENCOUNTER — Encounter: Payer: Self-pay | Admitting: Family Medicine

## 2018-01-16 ENCOUNTER — Other Ambulatory Visit: Payer: Self-pay

## 2018-01-16 ENCOUNTER — Ambulatory Visit (INDEPENDENT_AMBULATORY_CARE_PROVIDER_SITE_OTHER): Payer: Medicare HMO | Admitting: Family Medicine

## 2018-01-16 VITALS — BP 132/70 | HR 117 | Temp 98.6°F | Ht 62.75 in | Wt 155.2 lb

## 2018-01-16 DIAGNOSIS — E782 Mixed hyperlipidemia: Secondary | ICD-10-CM | POA: Diagnosis not present

## 2018-01-16 DIAGNOSIS — R7401 Elevation of levels of liver transaminase levels: Secondary | ICD-10-CM

## 2018-01-16 DIAGNOSIS — F3342 Major depressive disorder, recurrent, in full remission: Secondary | ICD-10-CM

## 2018-01-16 DIAGNOSIS — Z79899 Other long term (current) drug therapy: Secondary | ICD-10-CM | POA: Diagnosis not present

## 2018-01-16 DIAGNOSIS — R69 Illness, unspecified: Secondary | ICD-10-CM | POA: Diagnosis not present

## 2018-01-16 DIAGNOSIS — E1159 Type 2 diabetes mellitus with other circulatory complications: Secondary | ICD-10-CM

## 2018-01-16 DIAGNOSIS — I10 Essential (primary) hypertension: Secondary | ICD-10-CM

## 2018-01-16 DIAGNOSIS — R740 Nonspecific elevation of levels of transaminase and lactic acid dehydrogenase [LDH]: Secondary | ICD-10-CM

## 2018-01-16 DIAGNOSIS — R7402 Elevation of levels of lactic acid dehydrogenase (LDH): Secondary | ICD-10-CM

## 2018-01-16 LAB — CBC WITH DIFFERENTIAL/PLATELET
BASOS ABS: 0.1 10*3/uL (ref 0.0–0.1)
Basophils Relative: 0.5 % (ref 0.0–3.0)
EOS ABS: 0.1 10*3/uL (ref 0.0–0.7)
Eosinophils Relative: 1.1 % (ref 0.0–5.0)
HCT: 44.9 % (ref 36.0–46.0)
Hemoglobin: 15 g/dL (ref 12.0–15.0)
LYMPHS ABS: 4.1 10*3/uL — AB (ref 0.7–4.0)
LYMPHS PCT: 38.9 % (ref 12.0–46.0)
MCHC: 33.3 g/dL (ref 30.0–36.0)
MCV: 91.1 fl (ref 78.0–100.0)
Monocytes Absolute: 1 10*3/uL (ref 0.1–1.0)
Monocytes Relative: 9.2 % (ref 3.0–12.0)
NEUTROS PCT: 50.3 % (ref 43.0–77.0)
Neutro Abs: 5.2 10*3/uL (ref 1.4–7.7)
Platelets: 180 10*3/uL (ref 150.0–400.0)
RBC: 4.93 Mil/uL (ref 3.87–5.11)
RDW: 13.7 % (ref 11.5–15.5)
WBC: 10.4 10*3/uL (ref 4.0–10.5)

## 2018-01-16 LAB — BASIC METABOLIC PANEL
BUN: 11 mg/dL (ref 6–23)
CHLORIDE: 93 meq/L — AB (ref 96–112)
CO2: 29 meq/L (ref 19–32)
CREATININE: 0.84 mg/dL (ref 0.40–1.20)
Calcium: 10.1 mg/dL (ref 8.4–10.5)
GFR: 71.96 mL/min (ref >60.00)
Glucose, Bld: 444 mg/dL — ABNORMAL HIGH (ref 70–99)
POTASSIUM: 3.9 meq/L (ref 3.5–5.1)
Sodium: 132 mEq/L — ABNORMAL LOW (ref 135–145)

## 2018-01-16 LAB — HEMOGLOBIN A1C: Hgb A1c MFr Bld: 15.3 % — ABNORMAL HIGH (ref 4.6–6.5)

## 2018-01-16 LAB — HEPATIC FUNCTION PANEL
ALBUMIN: 4 g/dL (ref 3.5–5.2)
ALT: 46 U/L — AB (ref 0–35)
AST: 61 U/L — ABNORMAL HIGH (ref 0–37)
Alkaline Phosphatase: 129 U/L — ABNORMAL HIGH (ref 39–117)
Bilirubin, Direct: 0.1 mg/dL (ref 0.0–0.3)
TOTAL PROTEIN: 7.4 g/dL (ref 6.0–8.3)
Total Bilirubin: 0.4 mg/dL (ref 0.2–1.2)

## 2018-01-16 MED ORDER — FLUOXETINE HCL 20 MG PO CAPS: ORAL_CAPSULE | ORAL | 1 refills | Status: DC

## 2018-01-16 NOTE — Progress Notes (Signed)
Dr. [name] T. Tiffani Kadow, MD, [facility] [place], [address] Phone: [phone] Fax: [phone]  01/16/2018  Patient: Kristy [name], MRN: [id number], DOB: [birth year], 67 y.o.  Primary Physician:  Owens Loffler, MD   Chief Complaint  Patient presents with  . Diabetes   Subjective:   Kristy [name] is a 67 y.o. very pleasant female patient who presents with the following:  Very pleasant lady with multiple medical problems including severe poorly controlled diabetes as well as some cardiomyopathy who presents in medical follow-up.  Diabetes Mellitus: Tolerating Medications: yes Compliance with diet: fair Exercise: minimal / intermittent Avg blood sugars at home: 200 - 500. While taking 100 units TID Foot problems: none Hypoglycemia: none No nausea, vomitting, blurred vision, polyuria.  Lab Results  Component Value Date   HGBA1C 15.3 (H) 01/16/2018   HGBA1C 12.4 (H) 07/09/2017   HGBA1C 14.6 Repeated and verified X2. (H) 01/10/2017   Lab Results  Component Value Date   MICROALBUR 36.0 (H) 01/10/2017   LDLCALC 75 04/26/2009   CREATININE 0.84 01/16/2018    Wt Readings from Last 3 Encounters:  01/16/18 155 lb 4 oz (70.4 kg)  11/26/17 157 lb 12.8 oz (71.6 kg)  11/12/17 158 lb 4 oz (71.8 kg)    Body mass index is 27.72 kg/m.   HTN: Tolerating all medications without side effects Stable and at goal No CP, no sob. No HA.  BP Readings from Last 3 Encounters:  01/16/18 132/70  11/26/17 138/80  11/12/17 540/08    Basic Metabolic Panel:    Component Value Date/Time   NA 132 (L) 01/16/2018 1514   K 3.9 01/16/2018 1514   CL 93 (L) 01/16/2018 1514   CO2 29 01/16/2018 1514   BUN 11 01/16/2018 1514   CREATININE 0.84 01/16/2018 1514   CREATININE 0.72 07/27/2014 0902   GLUCOSE 444 (H) 01/16/2018 1514   CALCIUM 10.1 01/16/2018 1514     Had some knee pain, had a gout flare.   100 units 4 times  a day. BS 200 - 500.  Past Medical History, Surgical History, Social History, Family History, Problem List, Medications, and Allergies have been reviewed and updated if relevant.  Patient Active Problem List   Diagnosis Date Noted  . Healthcare maintenance 07/16/2017  . Coronary artery disease involving native heart without angina pectoris 09/13/2016  . Primary osteoarthritis involving multiple joints 03/14/2015  . Personal history of other malignant neoplasm of skin 12/01/2014  . Crohn's disease ([place]) 10/13/2013  . Major depressive disorder, recurrent, in full remission ([place]) 05/01/2011  . TRANSAMINASES, SERUM, ELEVATED 05/01/2010  . PSORIASIS 08/02/2009  . Hyperlipidemia 05/11/2009  . IRRITABLE BOWEL SYNDROME 03/10/2009  . GOUT 02/05/2009  . Type 2 diabetes mellitus with vascular disease ([place])   . Essential hypertension 04/18/2007  . LBBB (left bundle branch block) 04/18/2007  . ALLERGIC RHINITIS 04/18/2007  . GALLSTONES 04/18/2007  . DIVERTICULITIS, HX OF 04/18/2007    Past Medical History:  Diagnosis Date  . Allergic rhinitis   . Allergy   . Cardiomyopathy    mild nonischemic  . Cataract    mild   . Chronic systolic congestive heart failure ([place]) 01/11/2009   Qualifier: Diagnosis of  By: Silvio Pate MD, [facility]   . Crohn's disease ([place]) 10/13/2013  . Diabetes mellitus   . Diverticulosis   . GERD (gastroesophageal reflux disease)   . Gout   . Hyperlipidemia   . Hypertension   .  IBS (irritable bowel syndrome)   . Left bundle branch block   . Neuromuscular disorder (HCC)    neuropathy  . Osteoarthritis   . Symptomatic cholelithiasis     Past Surgical History:  Procedure Laterality Date  . COLONOSCOPY    . DILATION AND CURETTAGE OF UTERUS    . SHOULDER SURGERY     15 + yrs ago   . SKIN SURGERY     nose   . VAGINAL HYSTERECTOMY      Social History   Socioeconomic History  . Marital status: Married    Spouse name: Not on file  . Number of children: Not on  file  . Years of education: Not on file  . Highest education level: Not on file  Occupational History  . Not on file  Social Needs  . Financial resource strain: Not on file  . Food insecurity:    Worry: Not on file    Inability: Not on file  . Transportation needs:    Medical: Not on file    Non-medical: Not on file  Tobacco Use  . Smoking status: Former Smoker    Packs/day: 0.75    Years: 30.00    Pack years: 22.50    Types: Cigarettes    Last attempt to quit: 06/06/1997    Years since quitting: 20.6  . Smokeless tobacco: Never Used  Substance and Sexual Activity  . Alcohol use: No  . Drug use: No  . Sexual activity: Not on file  Lifestyle  . Physical activity:    Days per week: Not on file    Minutes per session: Not on file  . Stress: Not on file  Relationships  . Social connections:    Talks on phone: Not on file    Gets together: Not on file    Attends religious service: Not on file    Active member of club or organization: Not on file    Attends meetings of clubs or organizations: Not on file    Relationship status: Not on file  . Intimate partner violence:    Fear of current or ex partner: Not on file    Emotionally abused: Not on file    Physically abused: Not on file    Forced sexual activity: Not on file  Other Topics Concern  . Not on file  Social History Narrative  . Not on file    Family History  Problem Relation Age of Onset  . Hypertension Father   . Colon cancer Neg Hx   . Colon polyps Neg Hx   . Rectal cancer Neg Hx   . Stomach cancer Neg Hx     Allergies  Allergen Reactions  . Fish Oil Other (See Comments)    Gout  . Glimepiride     REACTION: hypoglycemia  . Guanfacine Hcl     REACTION: unspecified  . Rosiglitazone Other (See Comments)    CHF    Medication list reviewed and updated in full in [facility].   GEN: No acute illnesses, no fevers, chills. GI: No n/v/d, eating normally Pulm: No SOB Interactive and getting  along well at home.  Otherwise, ROS is as per the HPI.  Objective:   BP 132/70   Pulse (!) 117   Temp 98.6 F (37 C) (Oral)   Ht 5' 2.75" (1.594 m)   Wt 155 lb 4 oz (70.4 kg)   BMI 27.72 kg/m   GEN: WDWN, NAD, Non-toxic, A & O x  3 HEENT: Atraumatic, Normocephalic. Neck supple. No masses, No LAD. Ears and Nose: No external deformity. CV: RRR, No M/G/R. No JVD. No thrill. No extra heart sounds. PULM: CTA B, no wheezes, crackles, rhonchi. No retractions. No resp. distress. No accessory muscle use. EXTR: No c/c/e NEURO Normal gait.  PSYCH: Normally interactive. Conversant. Not depressed or anxious appearing.  Calm demeanor.   Laboratory and Imaging Data: Results for orders placed or performed in visit on 01/16/18  CBC with Differential/Platelet  Result Value Ref Range   WBC 10.4 4.0 - 10.5 K/uL   RBC 4.93 3.87 - 5.11 Mil/uL   Hemoglobin 15.0 12.0 - 15.0 g/dL   HCT 44.9 36.0 - 46.0 %   MCV 91.1 78.0 - 100.0 fl   MCHC 33.3 30.0 - 36.0 g/dL   RDW 13.7 11.5 - 15.5 %   Platelets 180.0 150.0 - 400.0 K/uL   Neutrophils Relative % 50.3 43.0 - 77.0 %   Lymphocytes Relative 38.9 12.0 - 46.0 %   Monocytes Relative 9.2 3.0 - 12.0 %   Eosinophils Relative 1.1 0.0 - 5.0 %   Basophils Relative 0.5 0.0 - 3.0 %   Neutro Abs 5.2 1.4 - 7.7 K/uL   Lymphs Abs 4.1 (H) 0.7 - 4.0 K/uL   Monocytes Absolute 1.0 0.1 - 1.0 K/uL   Eosinophils Absolute 0.1 0.0 - 0.7 K/uL   Basophils Absolute 0.1 0.0 - 0.1 K/uL  Basic metabolic panel  Result Value Ref Range   Sodium 132 (L) 135 - 145 mEq/L   Potassium 3.9 3.5 - 5.1 mEq/L   Chloride 93 (L) 96 - 112 mEq/L   CO2 29 19 - 32 mEq/L   Glucose, Bld 444 (H) 70 - 99 mg/dL   BUN 11 6 - 23 mg/dL   Creatinine, Ser 0.84 0.40 - 1.20 mg/dL   Calcium 10.1 8.4 - 10.5 mg/dL   GFR 71.96 >60.00 mL/min  Hepatic function panel  Result Value Ref Range   Total Bilirubin 0.4 0.2 - 1.2 mg/dL   Bilirubin, Direct 0.1 0.0 - 0.3 mg/dL   Alkaline Phosphatase 129 (H) 39 -  117 U/L   AST 61 (H) 0 - 37 U/L   ALT 46 (H) 0 - 35 U/L   Total Protein 7.4 6.0 - 8.3 g/dL   Albumin 4.0 3.5 - 5.2 g/dL  Hemoglobin A1c  Result Value Ref Range   Hgb A1c MFr Bld 15.3 (H) 4.6 - 6.5 %     Assessment and Plan:   Type 2 diabetes mellitus with vascular disease (HCC) - Plan: Basic metabolic panel, Hemoglobin A1c  Encounter for long-term (current) use of medications - Plan: CBC with Differential/Platelet, Hepatic function panel  Essential hypertension  Mixed hyperlipidemia  TRANSAMINASES, SERUM, ELEVATED  Major depressive disorder, recurrent, in full remission (HCC)   Her hemoglobin A1c is greater than 15 while taking 100 units of insulin 3 times a day.  Frankly I am at a loss for what to do to help her.  She has been to several endocrinologist, and blood sugars have remained high.  She could potentially benefit from going to [facility] or [facility] but she has no interest in doing this.  I may see if she would be willing to see 1 of the local endocrinologist again for any kind of suggestion that they might have.  She does not feel frankly depressed, but she does not feel like she is back at her normal baseline.  We are going to increase her Prozac dose  to an effective dose of 30 mg daily by doing 21-day followed by 40 the next day.  She has been compliant otherwise and she basically does not have any complaints.  Follow-up: 6 mo  Meds ordered this encounter  Medications  . FLUoxetine (PROZAC) 20 MG capsule    Sig: Alternate 2 tabs daily and then 1 tab the next day    Dispense:  135 capsule    Refill:  1   Medications Discontinued During This Encounter  Medication Reason  . FLUoxetine (PROZAC) 20 MG capsule    Orders Placed This Encounter  Procedures  . CBC with Differential/Platelet  . Basic metabolic panel  . Hepatic function panel  . Hemoglobin A1c    Signed,  Emmanuela Ghazi T. Kishawn Pickar, MD   Allergies as of 01/16/2018      Reactions   Fish Oil Other (See Comments)    Gout   Glimepiride    REACTION: hypoglycemia   Guanfacine Hcl    REACTION: unspecified   Rosiglitazone Other (See Comments)   CHF      Medication List        Accurate as of 01/16/18 11:59 PM. Always use your most recent med list.          ALIGN 4 MG Caps Take 1 capsule by mouth daily.   COLCRYS 0.6 MG tablet Generic drug:  colchicine TAKE 1 TABLET EVERY DAY   FLUoxetine 20 MG capsule Commonly known as:  PROZAC Alternate 2 tabs daily and then 1 tab the next day   insulin regular human CONCENTRATED 500 UNIT/ML injection Commonly known as:  HUMULIN R Inject 0.24 mLs (120 Units total) into the skin 3 (three) times daily with meals.   Insulin Syringe-Needle U-100 31G X 5/16" 0.5 ML Misc Commonly known as:  B-D INS SYRINGE 0.5CC/31GX5/16 USE AS DIRECTED THREE TIMES A DAY   lisinopril 40 MG tablet Commonly known as:  PRINIVIL,ZESTRIL Take 1 tablet (40 mg total) by mouth daily.   metroNIDAZOLE 0.75 % cream Commonly known as:  METROCREAM Apply 2 (two) times daily topically.   NONFORMULARY OR COMPOUNDED ITEM See pharmacy note   ONE TOUCH ULTRA TEST test strip Generic drug:  glucose blood USE AS PER PACKAGE INSTRUCTIONS CHECK SUGARS UP TO-FOUR TIMES A DAY OR AS PRESCRIBED   traMADol 50 MG tablet Commonly known as:  ULTRAM TAKE 1 TABLET EVERY 6 HOURS AS NEEDED   verapamil 180 MG 24 hr capsule Commonly known as:  VERELAN PM Take 1 capsule (180 mg total) by mouth at bedtime.

## 2018-04-17 DIAGNOSIS — R69 Illness, unspecified: Secondary | ICD-10-CM | POA: Diagnosis not present

## 2018-05-13 ENCOUNTER — Emergency Department: Payer: Medicare HMO

## 2018-05-13 ENCOUNTER — Inpatient Hospital Stay
Admission: EM | Admit: 2018-05-13 | Discharge: 2018-05-15 | DRG: 391 | Disposition: A | Discharge status: Home / Self Care | Payer: Medicare HMO | Attending: Internal Medicine | Admitting: Internal Medicine

## 2018-05-13 ENCOUNTER — Encounter: Payer: Self-pay | Admitting: *Deleted

## 2018-05-13 ENCOUNTER — Other Ambulatory Visit: Payer: Self-pay

## 2018-05-13 DIAGNOSIS — Z881 Allergy status to other antibiotic agents status: Secondary | ICD-10-CM

## 2018-05-13 DIAGNOSIS — I428 Other cardiomyopathies: Secondary | ICD-10-CM | POA: Diagnosis not present

## 2018-05-13 DIAGNOSIS — I2510 Atherosclerotic heart disease of native coronary artery without angina pectoris: Secondary | ICD-10-CM | POA: Diagnosis present

## 2018-05-13 DIAGNOSIS — I5022 Chronic systolic (congestive) heart failure: Secondary | ICD-10-CM | POA: Diagnosis not present

## 2018-05-13 DIAGNOSIS — Z794 Long term (current) use of insulin: Secondary | ICD-10-CM

## 2018-05-13 DIAGNOSIS — E1110 Type 2 diabetes mellitus with ketoacidosis without coma: Secondary | ICD-10-CM | POA: Diagnosis not present

## 2018-05-13 DIAGNOSIS — E785 Hyperlipidemia, unspecified: Secondary | ICD-10-CM | POA: Diagnosis present

## 2018-05-13 DIAGNOSIS — Z87891 Personal history of nicotine dependence: Secondary | ICD-10-CM | POA: Diagnosis not present

## 2018-05-13 DIAGNOSIS — N179 Acute kidney failure, unspecified: Secondary | ICD-10-CM | POA: Diagnosis present

## 2018-05-13 DIAGNOSIS — K5792 Diverticulitis of intestine, part unspecified, without perforation or abscess without bleeding: Secondary | ICD-10-CM | POA: Diagnosis present

## 2018-05-13 DIAGNOSIS — Z90710 Acquired absence of both cervix and uterus: Secondary | ICD-10-CM | POA: Diagnosis not present

## 2018-05-13 DIAGNOSIS — E1010 Type 1 diabetes mellitus with ketoacidosis without coma: Secondary | ICD-10-CM | POA: Diagnosis not present

## 2018-05-13 DIAGNOSIS — Z8249 Family history of ischemic heart disease and other diseases of the circulatory system: Secondary | ICD-10-CM | POA: Diagnosis not present

## 2018-05-13 DIAGNOSIS — Z888 Allergy status to other drugs, medicaments and biological substances status: Secondary | ICD-10-CM

## 2018-05-13 DIAGNOSIS — E1169 Type 2 diabetes mellitus with other specified complication: Secondary | ICD-10-CM | POA: Diagnosis present

## 2018-05-13 DIAGNOSIS — F3342 Major depressive disorder, recurrent, in full remission: Secondary | ICD-10-CM | POA: Diagnosis present

## 2018-05-13 DIAGNOSIS — I10 Essential (primary) hypertension: Secondary | ICD-10-CM | POA: Diagnosis present

## 2018-05-13 DIAGNOSIS — Z79899 Other long term (current) drug therapy: Secondary | ICD-10-CM

## 2018-05-13 DIAGNOSIS — R109 Unspecified abdominal pain: Secondary | ICD-10-CM | POA: Diagnosis not present

## 2018-05-13 DIAGNOSIS — K50911 Crohn's disease, unspecified, with rectal bleeding: Secondary | ICD-10-CM | POA: Diagnosis not present

## 2018-05-13 DIAGNOSIS — R1110 Vomiting, unspecified: Secondary | ICD-10-CM | POA: Diagnosis not present

## 2018-05-13 DIAGNOSIS — R1084 Generalized abdominal pain: Secondary | ICD-10-CM | POA: Diagnosis not present

## 2018-05-13 DIAGNOSIS — I110 Hypertensive heart disease with heart failure: Secondary | ICD-10-CM | POA: Diagnosis present

## 2018-05-13 DIAGNOSIS — E10649 Type 1 diabetes mellitus with hypoglycemia without coma: Secondary | ICD-10-CM | POA: Diagnosis present

## 2018-05-13 LAB — URINALYSIS, COMPLETE (UACMP) WITH MICROSCOPIC
BACTERIA UA: NONE SEEN
BILIRUBIN URINE: NEGATIVE
Glucose, UA: >=500 mg/dL — AB
Hgb urine dipstick: NEGATIVE
Ketones, ur: 5 mg/dL — AB
Leukocytes, UA: NEGATIVE
NITRITE: NEGATIVE
PROTEIN: 100 mg/dL — AB
SPECIFIC GRAVITY, URINE: 1.022 (ref 1.005–1.030)
pH: 5 (ref 5.0–8.0)

## 2018-05-13 LAB — COMPREHENSIVE METABOLIC PANEL
ALBUMIN: 4.1 g/dL (ref 3.5–5.0)
ALK PHOS: 111 U/L (ref 38–126)
ALT: 78 U/L — AB (ref 0–44)
AST: 200 U/L — AB (ref 15–41)
Anion gap: 17 — ABNORMAL HIGH (ref 5–15)
BILIRUBIN TOTAL: 1.5 mg/dL — AB (ref 0.3–1.2)
BUN: 30 mg/dL — AB (ref 8–23)
CALCIUM: 10 mg/dL (ref 8.9–10.3)
CO2: 23 mmol/L (ref 22–32)
Chloride: 89 mmol/L — ABNORMAL LOW (ref 98–111)
Creatinine, Ser: 1.53 mg/dL — ABNORMAL HIGH (ref 0.44–1.00)
GFR calc Af Amer: 40 mL/min — ABNORMAL LOW (ref >60)
GFR calc non Af Amer: 34 mL/min — ABNORMAL LOW (ref >60)
GLUCOSE: 625 mg/dL — AB (ref 70–99)
Potassium: 4.2 mmol/L (ref 3.5–5.1)
Sodium: 129 mmol/L — ABNORMAL LOW (ref 135–145)
Total Protein: 7.7 g/dL (ref 6.5–8.1)

## 2018-05-13 LAB — LIPASE, BLOOD: Lipase: 21 U/L (ref 11–51)

## 2018-05-13 LAB — CBC
HCT: 50.6 % — ABNORMAL HIGH (ref 35.0–47.0)
Hemoglobin: 16.6 g/dL — ABNORMAL HIGH (ref 12.0–16.0)
MCH: 30.4 pg (ref 26.0–34.0)
MCHC: 32.7 g/dL (ref 32.0–36.0)
MCV: 92.8 fL (ref 80.0–100.0)
Platelets: 231 10*3/uL (ref 150–440)
RBC: 5.45 MIL/uL — ABNORMAL HIGH (ref 3.80–5.20)
RDW: 14 % (ref 11.5–14.5)
WBC: 18.4 10*3/uL — ABNORMAL HIGH (ref 3.6–11.0)

## 2018-05-13 LAB — GLUCOSE, CAPILLARY
GLUCOSE-CAPILLARY: 491 mg/dL — AB (ref 70–99)
Glucose-Capillary: 440 mg/dL — ABNORMAL HIGH (ref 70–99)
Glucose-Capillary: 468 mg/dL — ABNORMAL HIGH (ref 70–99)

## 2018-05-13 LAB — TROPONIN I
TROPONIN I: 0.03 ng/mL — AB (ref <0.03)
TROPONIN I: 0.03 ng/mL — AB (ref <0.03)

## 2018-05-13 MED ORDER — IOPAMIDOL (ISOVUE-300) INJECTION 61%
30.0000 mL | Freq: Once | INTRAVENOUS | Status: AC | PRN
  Administered 2018-05-13: 30 mL via ORAL

## 2018-05-13 MED ORDER — INSULIN ASPART 100 UNIT/ML ~~LOC~~ SOLN
10.0000 [IU] | Freq: Once | SUBCUTANEOUS | Status: AC
  Administered 2018-05-13: 10 [IU] via INTRAVENOUS
  Filled 2018-05-13: qty 1

## 2018-05-13 MED ORDER — SODIUM CHLORIDE 0.9 % IV BOLUS
1000.0000 mL | Freq: Once | INTRAVENOUS | Status: AC
  Administered 2018-05-13: 1000 mL via INTRAVENOUS

## 2018-05-13 MED ORDER — CIPROFLOXACIN IN D5W 400 MG/200ML IV SOLN
400.0000 mg | Freq: Two times a day (BID) | INTRAVENOUS | Status: DC
  Administered 2018-05-13 – 2018-05-14 (×3): 400 mg via INTRAVENOUS
  Filled 2018-05-13 (×5): qty 200

## 2018-05-13 MED ORDER — IOHEXOL 300 MG/ML  SOLN
75.0000 mL | Freq: Once | INTRAMUSCULAR | Status: AC | PRN
  Administered 2018-05-13: 75 mL via INTRAVENOUS

## 2018-05-13 MED ORDER — SODIUM CHLORIDE 0.9 % IV SOLN
INTRAVENOUS | Status: DC
  Administered 2018-05-13: 4.3 [IU]/h via INTRAVENOUS
  Filled 2018-05-13: qty 1

## 2018-05-13 MED ORDER — ONDANSETRON HCL 4 MG/2ML IJ SOLN
4.0000 mg | Freq: Once | INTRAMUSCULAR | Status: AC
  Administered 2018-05-13: 4 mg via INTRAVENOUS
  Filled 2018-05-13: qty 2

## 2018-05-13 MED ORDER — METRONIDAZOLE IN NACL 5-0.79 MG/ML-% IV SOLN
500.0000 mg | Freq: Three times a day (TID) | INTRAVENOUS | Status: DC
  Administered 2018-05-13 – 2018-05-15 (×5): 500 mg via INTRAVENOUS
  Filled 2018-05-13 (×7): qty 100

## 2018-05-13 NOTE — ED Provider Notes (Signed)
[facility] Emergency Department Provider Note  ____________________________________________   None    (approximate)  I have reviewed the triage vital signs and the nursing notes.   HISTORY  Chief Complaint Abdominal Pain and Rectal Bleeding   HPI Kristy [name] is a 67 y.o. female this to the emergency department for treatment and evaluation of abdominal pain that started this morning.  Patient states that at this time it has subsided but states that she got concerned because she has a history of Crohn's, IBS and diverticulosis.  She states that she has had 2 episodes of "pink" stool.  She also states that she has had several episodes of vomiting.  She has diarrhea at baseline.  Patient states that her glucose typically runs between 4 and 600.  She states that she is on 400 units of insulin per day.  She states that this is normal for her and states that "my diabetes is unlike anybody else's."  Patient denies dizziness or polyuria however she does complain of polydipsia.  She has no history of DKA.  She has no history of pancreatitis.  She states that she is never been hospitalized for her hyperglycemia.  She was diagnosed with diabetes 35 years ago.  Past Medical History:  Diagnosis Date  . Allergic rhinitis   . Allergy   . Cardiomyopathy    mild nonischemic  . Cataract    mild   . Chronic systolic congestive heart failure ([place]) 01/11/2009   Qualifier: Diagnosis of  By: Silvio Pate MD, [facility]   . Crohn's disease ([place]) 10/13/2013  . Diabetes mellitus   . Diverticulosis   . GERD (gastroesophageal reflux disease)   . Gout   . Hyperlipidemia   . Hypertension   . IBS (irritable bowel syndrome)   . Left bundle branch block   . Neuromuscular disorder ([facility])    neuropathy  . Osteoarthritis   . Symptomatic cholelithiasis     Patient Active Problem List   Diagnosis Date Noted  . DKA (diabetic ketoacidoses) ([place]) 05/13/2018  . Coronary artery disease  involving native heart without angina pectoris 09/13/2016  . Primary osteoarthritis involving multiple joints 03/14/2015  . Personal history of other malignant neoplasm of skin 12/01/2014  . Crohn's disease ([place]) 10/13/2013  . Major depressive disorder, recurrent, in full remission ([place]) 05/01/2011  . TRANSAMINASES, SERUM, ELEVATED 05/01/2010  . PSORIASIS 08/02/2009  . Hyperlipidemia 05/11/2009  . IRRITABLE BOWEL SYNDROME 03/10/2009  . GOUT 02/05/2009  . Type 2 diabetes mellitus with vascular disease ([place])   . Essential hypertension 04/18/2007  . LBBB (left bundle branch block) 04/18/2007  . ALLERGIC RHINITIS 04/18/2007  . GALLSTONES 04/18/2007  . Diverticulitis 04/18/2007    Past Surgical History:  Procedure Laterality Date  . COLONOSCOPY    . DILATION AND CURETTAGE OF UTERUS    . SHOULDER SURGERY     15 + yrs ago   . SKIN SURGERY     nose   . VAGINAL HYSTERECTOMY      Prior to Admission medications   Medication Sig Start Date End Date Taking? Authorizing Provider  FLUoxetine (PROZAC) 20 MG capsule Alternate 2 tabs daily and then 1 tab the next day 01/16/18  Yes Copland, [name], MD  insulin regular human CONCENTRATED (HUMULIN R) 500 UNIT/ML injection Inject 0.24 mLs (120 Units total) into the skin 3 (three) times daily with meals. Patient taking differently: Inject 100 Units into the skin 3 (three) times daily with meals.  03/05/17  Yes  Copland, Spencer, MD  Probiotic Product (ALIGN) 4 MG CAPS Take 1 capsule by mouth daily.    Yes [provider]  verapamil (VERELAN PM) 180 MG 24 hr capsule Take 1 capsule (180 mg total) by mouth at bedtime. 03/12/17  Yes Gollan, Kathlene November, MD  COLCRYS 0.6 MG tablet TAKE 1 TABLET EVERY DAY Patient not taking: Reported on 05/13/2018 07/03/17   Copland, [name], MD  Insulin Syringe-Needle U-100 (B-D INS SYRINGE 0.5CC/31GX5/16) 31G X 5/16" 0.5 ML MISC USE AS DIRECTED THREE TIMES A DAY 08/06/17   Copland, [name], MD  lisinopril  (PRINIVIL,ZESTRIL) 40 MG tablet Take 1 tablet (40 mg total) by mouth daily. Patient not taking: Reported on 05/13/2018 11/02/17   Owens Loffler, MD  NONFORMULARY OR COMPOUNDED ITEM See pharmacy note Patient taking differently: Apply 1 application topically at bedtime. See pharmacy note 07/20/17   Edrick Kins, DPM  ONE TOUCH ULTRA TEST test strip USE AS PER PACKAGE INSTRUCTIONS CHECK SUGARS UP TO-FOUR TIMES A DAY OR AS PRESCRIBED 05/21/17   Copland, [name], MD  traMADol (ULTRAM) 50 MG tablet TAKE 1 TABLET EVERY 6 HOURS AS NEEDED Patient not taking: Reported on 05/13/2018 04/16/17   Copland, [name], MD    Allergies Fish oil; Glimepiride; Guanfacine hcl; and Rosiglitazone  Family History  Problem Relation Age of Onset  . Hypertension Father   . Colon cancer Neg Hx   . Colon polyps Neg Hx   . Rectal cancer Neg Hx   . Stomach cancer Neg Hx     Social History Social History   Tobacco Use  . Smoking status: Former Smoker    Packs/day: 0.75    Years: 30.00    Pack years: 22.50    Types: Cigarettes    Last attempt to quit: 06/06/1997    Years since quitting: 20.9  . Smokeless tobacco: Never Used  Substance Use Topics  . Alcohol use: No  . Drug use: No    Review of Systems  Constitutional: No fever/chills Eyes: No visual changes. ENT: No sore throat. Cardiovascular: Denies chest pain.  Respiratory: Denies shortness of breath. Gastrointestinal: Positive for abdominal pain.  Genitourinary: Negative for dysuria. Musculoskeletal: Negative for back pain. Skin: Negative for rash. Neurological: Negative for headaches, focal weakness or numbness. ____________________________________________   PHYSICAL EXAM:  VITAL SIGNS: ED Triage Vitals [05/13/18 1825]  Enc Vitals Group     BP 123/80     Pulse Rate (!) 130     Resp 16     Temp 97.6 F (36.4 C)     Temp Source Oral     SpO2 97 %     Weight 150 lb (68 kg)     Height 5' 3"  (1.6 m)     Head Circumference      Peak Flow       Pain Score 0     Pain Loc      Pain Edu?      Excl. in [place]?     Constitutional: Alert and oriented. Well appearing and in no acute distress. Eyes: Conjunctivae are normal. PERRL. Head: Atraumatic. Nose: No congestion/rhinnorhea. Mouth/Throat: Mucous membranes are moist.  Oropharynx non-erythematous. Neck: No stridor.   Cardiovascular: Normal rate, regular rhythm. Grossly normal heart sounds.  Good peripheral circulation. Respiratory: Normal respiratory effort.  No retractions. Lungs CTAB. Gastrointestinal: Soft, right lower quadrant tenderness. No distention. No abdominal bruits. Hypoactive bowel sounds throughout. EKG musculoskeletal: No lower extremity tenderness nor edema.  No joint effusions. Neurologic:  Normal speech  and language. No gross focal neurologic deficits are appreciated. No gait instability. Skin:  Skin is warm, dry and intact. No rash noted. Psychiatric: Mood and affect are normal. Speech and behavior are normal.  ____________________________________________   LABS (all labs ordered are listed, but only abnormal results are displayed)  Labs Reviewed  COMPREHENSIVE METABOLIC PANEL - Abnormal; Notable for the following components:      Result Value   Sodium 129 (*)    Chloride 89 (*)    Glucose, Bld 625 (*)    BUN 30 (*)    Creatinine, Ser 1.53 (*)    AST 200 (*)    ALT 78 (*)    Total Bilirubin 1.5 (*)    GFR calc non Af Amer 34 (*)    GFR calc Af Amer 40 (*)    Anion gap 17 (*)    All other components within normal limits  CBC - Abnormal; Notable for the following components:   WBC 18.4 (*)    RBC 5.45 (*)    Hemoglobin 16.6 (*)    HCT 50.6 (*)    All other components within normal limits  URINALYSIS, COMPLETE (UACMP) WITH MICROSCOPIC - Abnormal; Notable for the following components:   Color, Urine AMBER (*)    APPearance HAZY (*)    Glucose, UA >=500 (*)    Ketones, ur 5 (*)    Protein, ur 100 (*)    All other components within normal limits    TROPONIN I - Abnormal; Notable for the following components:   Troponin I 0.03 (*)    All other components within normal limits  GLUCOSE, CAPILLARY - Abnormal; Notable for the following components:   Glucose-Capillary >600 (*)    All other components within normal limits  BLOOD GAS, VENOUS - Abnormal; Notable for the following components:   pCO2, Ven 36 (*)    Bicarbonate 16.9 (*)    Acid-base deficit 9.1 (*)    All other components within normal limits  TROPONIN I - Abnormal; Notable for the following components:   Troponin I 0.03 (*)    All other components within normal limits  GLUCOSE, CAPILLARY - Abnormal; Notable for the following components:   Glucose-Capillary 491 (*)    All other components within normal limits  LIPASE, BLOOD  CBG MONITORING, ED  CBG MONITORING, ED  CBG MONITORING, ED  CBG MONITORING, ED  CBG MONITORING, ED  CBG MONITORING, ED  CBG MONITORING, ED  CBG MONITORING, ED  CBG MONITORING, ED  CBG MONITORING, ED  CBG MONITORING, ED  CBG MONITORING, ED  CBG MONITORING, ED  CBG MONITORING, ED   ____________________________________________  EKG  See MD reading. ____________________________________________  RADIOLOGY  ED MD interpretation:  Diverticulitis within the sigmoid colon without perforation or abscess.  Official radiology report(s): Ct Abdomen Pelvis W Contrast  Result Date: 05/13/2018 CLINICAL DATA:  Sudden onset of abdominal pain with nausea and vomiting, initial encounter EXAM: CT ABDOMEN AND PELVIS WITH CONTRAST TECHNIQUE: Multidetector CT imaging of the abdomen and pelvis was performed using the standard protocol following bolus administration of intravenous contrast. CONTRAST:  66m OMNIPAQUE IOHEXOL 300 MG/ML  SOLN COMPARISON:  12/04/2008 FINDINGS: Lower chest: No acute abnormality. Hepatobiliary: Liver is well visualized with mild decreased attenuation consistent with fatty infiltration. No hepatic mass is noted. Gallbladder is well  distended with dependent gallstones. No biliary ductal dilatation is seen. Pancreas: Unremarkable. No pancreatic ductal dilatation or surrounding inflammatory changes. Spleen: Normal in size without focal abnormality. Adrenals/Urinary Tract:  Adrenal glands are within normal limits. The kidneys are well visualized bilaterally. No renal calculi or obstructive changes are seen. Left renal cyst is noted and enlarged in size now measuring 4.2 cm in greatest dimension. Bladder is well distended. Stomach/Bowel: Diverticular change of the colon is noted with evidence of mild early diverticulitis in the mid sigmoid colon. No perforation or abscess formation is noted. The appendix is not well visualized and may have been surgically removed. No inflammatory changes are identified. The small bowel is within normal limits. Vascular/Lymphatic: Aortic atherosclerosis. No enlarged abdominal or pelvic lymph nodes. Reproductive: Status post hysterectomy. No adnexal masses. Other: No abdominal wall hernia or abnormality. No abdominopelvic ascites. Musculoskeletal: No acute or significant osseous findings. IMPRESSION: Diverticulitis within the sigmoid colon without perforation or abscess formation. Cholelithiasis without complicating factors. Chronic changes without acute abnormality. Electronically Signed   By: Inez Catalina M.D.   On: 05/13/2018 21:39    ____________________________________________   PROCEDURES  Procedure(s) performed: None  Procedures  Critical Care performed: No  ____________________________________________   INITIAL IMPRESSION / ASSESSMENT AND PLAN / ED COURSE  As part of my medical decision making, I reviewed the following data within the electronic MEDICAL RECORD NUMBER Notes from prior office visits.  67 year old female presenting to the emergency department for treatment and evaluation of hyperglycemia and abdominal pain.  Hyperglycemia is certainly not new and according to the patient has not  surprisingly high for her.  Patient states that earlier this morning, she had pain in her stomach and low back.  She reports that when she has pain in her back she takes insulin and it goes away.  She states that this morning it did not go away as usual.  She states that she then began to have nausea vomiting and 2 episodes of pink stool.  Patient has a history of diverticulosis, Crohn's, and irritable bowel syndrome.  Differential diagnosis at this time includes DKA, hyperglycemia, diverticulitis, diverticulosis, Crohn's, irritable bowel flare.  IV fluids, labs, and images have been ordered.  Patient is agreeable to this plan.  ----------------------------------------- 8:00 PM on 05/13/2018 ----------------------------------------- Labs reviewed, patient will be given 10 units of insulin IV.  ----------------------------------------- 8:20 PM on 05/13/2018 ----------------------------------------- Patient was updated on labs and images.  Patient is aware that she will likely be admitted.  Patient agrees the plan.  ----------------------------------------- 10:21 PM on 05/13/2018 -----------------------------------------  Patient to be admitted for diverticulitis and DKA.  Ciprofloxacin will be started in addition to the IV insulin drip.  Patient's glucose has decreased to 491.  Patient states that she is feeling nauseated and the nurse reports that she vomited one time.  Zofran has been requested.  CRITICAL CARE Performed by: Sherrie George   Total critical care time: 45 minutes  Critical care time was exclusive of separately billable procedures and treating other patients.  Critical care was necessary to treat or prevent imminent or life-threatening deterioration.  Critical care was time spent personally by me on the following activities: development of treatment plan with patient and/or surrogate as well as nursing, discussions with consultants, evaluation of patient's response to  treatment, examination of patient, obtaining history from patient or surrogate, ordering and performing treatments and interventions, ordering and review of laboratory studies, ordering and review of radiographic studies, pulse oximetry and re-evaluation of patient's condition.  ____________________________________________   FINAL CLINICAL IMPRESSION(S) / ED DIAGNOSES  Final diagnoses:  Diabetic ketoacidosis without coma associated with type 1 diabetes mellitus ([place])  Diverticulitis  ED Discharge Orders    None       Note:  This document was prepared using Dragon voice recognition software and may include unintentional dictation errors.    Victorino Dike, FNP 05/13/18 2226    Nance Pear, MD 05/13/18 2239

## 2018-05-13 NOTE — H&P (Signed)
[facility] [place] NAME: [name]    MR#:  [id number]  DATE OF BIRTH:  [birth year]  DATE OF ADMISSION:  05/13/2018  PRIMARY CARE PHYSICIAN: Owens Loffler, MD   REQUESTING/REFERRING PHYSICIAN: Vanessa Buckhannon, FNP  CHIEF COMPLAINT:   Chief Complaint  Patient presents with  . Abdominal Pain  . Rectal Bleeding    HISTORY OF PRESENT ILLNESS:  Kristy [name]  is a 67 y.o. female who presents with chief complaint as above.  She states she developed abdominal pain over the past couple of days, but it got significantly worse today.  She came to ED for evaluation and was found to have diverticulitis on imaging here.  She is also found to be in mild DKA.  Treatment was started in the ED and hospitalist were called for admission  PAST MEDICAL HISTORY:   Past Medical History:  Diagnosis Date  . Allergic rhinitis   . Allergy   . Cardiomyopathy    mild nonischemic  . Cataract    mild   . Chronic systolic congestive heart failure ([place]) 01/11/2009   Qualifier: Diagnosis of  By: Silvio Pate MD, [facility]   . Crohn's disease ([place]) 10/13/2013  . Diabetes mellitus   . Diverticulosis   . GERD (gastroesophageal reflux disease)   . Gout   . Hyperlipidemia   . Hypertension   . IBS (irritable bowel syndrome)   . Left bundle branch block   . Neuromuscular disorder ([facility])    neuropathy  . Osteoarthritis   . Symptomatic cholelithiasis      PAST SURGICAL HISTORY:   Past Surgical History:  Procedure Laterality Date  . COLONOSCOPY    . DILATION AND CURETTAGE OF UTERUS    . SHOULDER SURGERY     15 + yrs ago   . SKIN SURGERY     nose   . VAGINAL HYSTERECTOMY       SOCIAL HISTORY:   Social History   Tobacco Use  . Smoking status: Former Smoker    Packs/day: 0.75    Years: 30.00    Pack years: 22.50    Types: Cigarettes    Last attempt to quit: 06/06/1997    Years since quitting: 20.9  . Smokeless tobacco: Never Used  Substance  Use Topics  . Alcohol use: No     FAMILY HISTORY:   Family History  Problem Relation Age of Onset  . Hypertension Father   . Colon cancer Neg Hx   . Colon polyps Neg Hx   . Rectal cancer Neg Hx   . Stomach cancer Neg Hx      DRUG ALLERGIES:   Allergies  Allergen Reactions  . Fish Oil Other (See Comments)    Gout  . Glimepiride     REACTION: hypoglycemia  . Guanfacine Hcl     REACTION: unspecified  . Rosiglitazone Other (See Comments)    CHF    MEDICATIONS AT HOME:   Prior to Admission medications   Medication Sig Start Date End Date Taking? Authorizing Provider  FLUoxetine (PROZAC) 20 MG capsule Alternate 2 tabs daily and then 1 tab the next day 01/16/18  Yes Copland, [name], MD  insulin regular human CONCENTRATED (HUMULIN R) 500 UNIT/ML injection Inject 0.24 mLs (120 Units total) into the skin 3 (three) times daily with meals. Patient taking differently: Inject 100 Units into the skin 3 (three) times daily with meals.  03/05/17  Yes Copland, [name], MD  Probiotic Product (ALIGN) 4  MG CAPS Take 1 capsule by mouth daily.    Yes [provider]  verapamil (VERELAN PM) 180 MG 24 hr capsule Take 1 capsule (180 mg total) by mouth at bedtime. 03/12/17  Yes Gollan, Kathlene November, MD  COLCRYS 0.6 MG tablet TAKE 1 TABLET EVERY DAY Patient not taking: Reported on 05/13/2018 07/03/17   Copland, [name], MD  Insulin Syringe-Needle U-100 (B-D INS SYRINGE 0.5CC/31GX5/16) 31G X 5/16" 0.5 ML MISC USE AS DIRECTED THREE TIMES A DAY 08/06/17   Copland, [name], MD  lisinopril (PRINIVIL,ZESTRIL) 40 MG tablet Take 1 tablet (40 mg total) by mouth daily. Patient not taking: Reported on 05/13/2018 11/02/17   Owens Loffler, MD  NONFORMULARY OR COMPOUNDED ITEM See pharmacy note Patient taking differently: Apply 1 application topically at bedtime. See pharmacy note 07/20/17   Edrick Kins, DPM  ONE TOUCH ULTRA TEST test strip USE AS PER PACKAGE INSTRUCTIONS CHECK SUGARS UP TO-FOUR TIMES A DAY  OR AS PRESCRIBED 05/21/17   Copland, [name], MD  traMADol (ULTRAM) 50 MG tablet TAKE 1 TABLET EVERY 6 HOURS AS NEEDED Patient not taking: Reported on 05/13/2018 04/16/17   Owens Loffler, MD    REVIEW OF SYSTEMS:  Review of Systems  Constitutional: Negative for chills, fever, malaise/fatigue and weight loss.  HENT: Negative for ear pain, hearing loss and tinnitus.   Eyes: Negative for blurred vision, double vision, pain and redness.  Respiratory: Negative for cough, hemoptysis and shortness of breath.   Cardiovascular: Negative for chest pain, palpitations, orthopnea and leg swelling.  Gastrointestinal: Positive for abdominal pain and nausea. Negative for constipation, diarrhea and vomiting.  Genitourinary: Negative for dysuria, frequency and hematuria.  Musculoskeletal: Negative for back pain, joint pain and neck pain.  Skin:       No acne, rash, or lesions  Neurological: Negative for dizziness, tremors, focal weakness and weakness.  Endo/Heme/Allergies: Negative for polydipsia. Does not bruise/bleed easily.  Psychiatric/Behavioral: Negative for depression. The patient is not nervous/anxious and does not have insomnia.      VITAL SIGNS:   Vitals:   05/13/18 2032 05/13/18 2140 05/13/18 2223 05/13/18 2332  BP: (!) 164/73 139/66 140/69 140/79  Pulse: (!) 105 (!) 107 (!) 111 (!) 104  Resp: 18 17 18 18   Temp:      TempSrc:      SpO2: 100% 98% 97% 97%  Weight:      Height:       Wt Readings from Last 3 Encounters:  05/13/18 68 kg  01/16/18 70.4 kg  11/26/17 71.6 kg    PHYSICAL EXAMINATION:  Physical Exam  Vitals reviewed. Constitutional: She is oriented to person, place, and time. She appears well-developed and well-nourished. No distress.  HENT:  Head: Normocephalic and atraumatic.  Mouth/Throat: Oropharynx is clear and moist.  Eyes: Pupils are equal, round, and reactive to light. Conjunctivae and EOM are normal. No scleral icterus.  Neck: Normal range of motion. Neck  supple. No JVD present. No thyromegaly present.  Cardiovascular: Normal rate, regular rhythm and intact distal pulses. Exam reveals no gallop and no friction rub.  No murmur heard. Respiratory: Effort normal and breath sounds normal. No respiratory distress. She has no wheezes. She has no rales.  GI: Soft. Bowel sounds are normal. She exhibits no distension. There is tenderness.  Musculoskeletal: Normal range of motion. She exhibits no edema.  No arthritis, no gout  Lymphadenopathy:    She has no cervical adenopathy.  Neurological: She is alert and oriented to person, place, and  time. No cranial nerve deficit.  No dysarthria, no aphasia  Skin: Skin is warm and dry. No rash noted. No erythema.  Psychiatric: She has a normal mood and affect. Her behavior is normal. Judgment and thought content normal.    LABORATORY PANEL:   CBC Recent Labs  Lab 05/13/18 1842  WBC 18.4*  HGB 16.6*  HCT 50.6*  PLT 231   ------------------------------------------------------------------------------------------------------------------  Chemistries  Recent Labs  Lab 05/13/18 1842  NA 129*  K 4.2  CL 89*  CO2 23  GLUCOSE 625*  BUN 30*  CREATININE 1.53*  CALCIUM 10.0  AST 200*  ALT 78*  ALKPHOS 111  BILITOT 1.5*   ------------------------------------------------------------------------------------------------------------------  Cardiac Enzymes Recent Labs  Lab 05/13/18 2144  TROPONINI 0.03*   ------------------------------------------------------------------------------------------------------------------  RADIOLOGY:  Ct Abdomen Pelvis W Contrast  Result Date: 05/13/2018 CLINICAL DATA:  Sudden onset of abdominal pain with nausea and vomiting, initial encounter EXAM: CT ABDOMEN AND PELVIS WITH CONTRAST TECHNIQUE: Multidetector CT imaging of the abdomen and pelvis was performed using the standard protocol following bolus administration of intravenous contrast. CONTRAST:  49m OMNIPAQUE  IOHEXOL 300 MG/ML  SOLN COMPARISON:  12/04/2008 FINDINGS: Lower chest: No acute abnormality. Hepatobiliary: Liver is well visualized with mild decreased attenuation consistent with fatty infiltration. No hepatic mass is noted. Gallbladder is well distended with dependent gallstones. No biliary ductal dilatation is seen. Pancreas: Unremarkable. No pancreatic ductal dilatation or surrounding inflammatory changes. Spleen: Normal in size without focal abnormality. Adrenals/Urinary Tract: Adrenal glands are within normal limits. The kidneys are well visualized bilaterally. No renal calculi or obstructive changes are seen. Left renal cyst is noted and enlarged in size now measuring 4.2 cm in greatest dimension. Bladder is well distended. Stomach/Bowel: Diverticular change of the colon is noted with evidence of mild early diverticulitis in the mid sigmoid colon. No perforation or abscess formation is noted. The appendix is not well visualized and may have been surgically removed. No inflammatory changes are identified. The small bowel is within normal limits. Vascular/Lymphatic: Aortic atherosclerosis. No enlarged abdominal or pelvic lymph nodes. Reproductive: Status post hysterectomy. No adnexal masses. Other: No abdominal wall hernia or abnormality. No abdominopelvic ascites. Musculoskeletal: No acute or significant osseous findings. IMPRESSION: Diverticulitis within the sigmoid colon without perforation or abscess formation. Cholelithiasis without complicating factors. Chronic changes without acute abnormality. Electronically Signed   By: MInez CatalinaM.D.   On: 05/13/2018 21:39    EKG:   Orders placed or performed during the hospital encounter of 05/13/18  . ED EKG  . ED EKG    IMPRESSION AND PLAN:  Principal Problem:   Diverticulitis -IV antibiotics initiated, PRN antiemetics and PRN analgesia Active Problems:   DKA (diabetic ketoacidoses) ([place] -patient was placed on insulin drip in the ED, anion gap  closed relatively quickly, transition off drip, resistant scale sliding scale insulin.  Patient states that she normally takes 100 units of regular insulin 4 times a day at home, will monitor her glucose level closely and scale of her insulin dose as needed   AKI (acute kidney injury) ([place] -due to DKA, IV fluids as per protocol, avoid nephrotoxins and monitor   Essential hypertension -continue home meds   Coronary artery disease involving native heart without angina pectoris -continue home medications   Major depressive disorder, recurrent, in full remission ([place] -Home dose antidepressant  Chart review performed and case discussed with ED provider. Labs, imaging and/or ECG reviewed by provider and discussed with patient/family. Management plans discussed with the patient  and/or family.  DVT PROPHYLAXIS: SubQ lovenox   GI PROPHYLAXIS:  None  ADMISSION STATUS: Inpatient     CODE STATUS: Full  TOTAL TIME TAKING CARE OF THIS PATIENT: 45 minutes.   Infant Zink Du Pont 05/13/2018, 11:49 PM  [employer]  [phone]  CC: Primary care physician; Owens Loffler, MD  Note:  This document was prepared using Dragon voice recognition software and may include unintentional dictation errors.

## 2018-05-13 NOTE — ED Provider Notes (Signed)
Kristy [name], attending physician, personally viewed and interpreted this EKG  EKG Time: 1842 Rate: 128 Rhythm: sinus tachycardia Axis: normal Intervals: qtc 493 QRS: LVH ST changes: no st elevation Impression: abnormal Val Eagle, MD 05/13/18 2209

## 2018-05-13 NOTE — ED Triage Notes (Signed)
Pt to ED reporting sudden onset of abd pain this morning that has since subsided but pt now noted bright red rectal bleeding.  2 episodes of vomiting bu no nausea reported. No diarrhea.   Pt went to urgent care for these symptoms and was told to go to the hospital after discovering her glucose was  Over 600. pT reports this is normal for her and she is currently seeing an endocrinologist multiple times a month and currently takes 400 units of insulin a day without success managing glucose. Pt also reports her HR is normally 120 at baseline. In triage pts HR is noted to be 130. No dizziness reported and pt reports she feels back to baseline.

## 2018-05-13 NOTE — ED Notes (Signed)
ED Provider at bedside. 

## 2018-05-13 NOTE — Consult Note (Signed)
Pharmacy Antibiotic Note  Kristy [name] is a 67 y.o. female admitted on 05/13/2018 with intra-abdominal infection.  Pharmacy has been consulted for cipro dosing.  Plan: cipro 48m IV q 12 hr  Height: 5' 3"  (160 cm) Weight: 150 lb (68 kg) IBW/kg (Calculated) : 52.4  Temp (24hrs), Avg:97.6 F (36.4 C), Min:97.6 F (36.4 C), Max:97.6 F (36.4 C)  Recent Labs  Lab 05/13/18 1842  WBC 18.4*  CREATININE 1.53*    Estimated Creatinine Clearance: 33.5 mL/min (A) (by C-G formula based on SCr of 1.53 mg/dL (H)).    Allergies  Allergen Reactions  . Fish Oil Other (See Comments)    Gout  . Glimepiride     REACTION: hypoglycemia  . Guanfacine Hcl     REACTION: unspecified  . Rosiglitazone Other (See Comments)    CHF    Antimicrobials this admission: 08/12 cipro >>  metronidazole 8/12 >>   Dose adjustments this admission:   Microbiology results:  Thank you for allowing pharmacy to be a part of this patient's care. MRamond Dial Pharm.D, BCPS Clinical Pharmacist  05/13/2018 10:11 PM

## 2018-05-14 ENCOUNTER — Other Ambulatory Visit: Payer: Self-pay

## 2018-05-14 LAB — BASIC METABOLIC PANEL
ANION GAP: 13 (ref 5–15)
ANION GAP: 13 (ref 5–15)
BUN: 24 mg/dL — ABNORMAL HIGH (ref 8–23)
BUN: 21 mg/dL (ref 8–23)
CALCIUM: 8.5 mg/dL — AB (ref 8.9–10.3)
CO2: 21 mmol/L — ABNORMAL LOW (ref 22–32)
CO2: 24 mmol/L (ref 22–32)
Calcium: 8.7 mg/dL — ABNORMAL LOW (ref 8.9–10.3)
Chloride: 97 mmol/L — ABNORMAL LOW (ref 98–111)
Chloride: 101 mmol/L (ref 98–111)
Creatinine, Ser: 1.16 mg/dL — ABNORMAL HIGH (ref 0.44–1.00)
Creatinine, Ser: 1.06 mg/dL — ABNORMAL HIGH (ref 0.44–1.00)
GFR calc Af Amer: 56 mL/min — ABNORMAL LOW (ref >60)
GFR calc non Af Amer: 48 mL/min — ABNORMAL LOW (ref >60)
GFR, EST NON AFRICAN AMERICAN: 53 mL/min — AB (ref >60)
GLUCOSE: 405 mg/dL — AB (ref 70–99)
Glucose, Bld: 285 mg/dL — ABNORMAL HIGH (ref 70–99)
POTASSIUM: 3.6 mmol/L (ref 3.5–5.1)
Potassium: 3.5 mmol/L (ref 3.5–5.1)
SODIUM: 135 mmol/L (ref 135–145)
Sodium: 134 mmol/L — ABNORMAL LOW (ref 135–145)

## 2018-05-14 LAB — TROPONIN I
TROPONIN I: 0.04 ng/mL — AB (ref <0.03)
Troponin I: 0.04 ng/mL (ref <0.03)

## 2018-05-14 LAB — CBC
HEMATOCRIT: 41.4 % (ref 35.0–47.0)
HEMOGLOBIN: 13.8 g/dL (ref 12.0–16.0)
MCH: 30.4 pg (ref 26.0–34.0)
MCHC: 33.3 g/dL (ref 32.0–36.0)
MCV: 91.4 fL (ref 80.0–100.0)
Platelets: 158 10*3/uL (ref 150–440)
RBC: 4.53 MIL/uL (ref 3.80–5.20)
RDW: 13.6 % (ref 11.5–14.5)
WBC: 16.2 10*3/uL — AB (ref 3.6–11.0)

## 2018-05-14 LAB — GLUCOSE, CAPILLARY
GLUCOSE-CAPILLARY: 274 mg/dL — AB (ref 70–99)
Glucose-Capillary: 219 mg/dL — ABNORMAL HIGH (ref 70–99)
Glucose-Capillary: 293 mg/dL — ABNORMAL HIGH (ref 70–99)
Glucose-Capillary: 340 mg/dL — ABNORMAL HIGH (ref 70–99)
Glucose-Capillary: 357 mg/dL — ABNORMAL HIGH (ref 70–99)
Glucose-Capillary: 410 mg/dL — ABNORMAL HIGH (ref 70–99)
Glucose-Capillary: 410 mg/dL — ABNORMAL HIGH (ref 70–99)

## 2018-05-14 MED ORDER — INSULIN ASPART 100 UNIT/ML ~~LOC~~ SOLN
0.0000 [IU] | Freq: Three times a day (TID) | SUBCUTANEOUS | Status: DC
  Administered 2018-05-14: 17:00:00 11 [IU] via SUBCUTANEOUS
  Administered 2018-05-14: 20 [IU] via SUBCUTANEOUS
  Administered 2018-05-15: 15 [IU] via SUBCUTANEOUS
  Filled 2018-05-14 (×3): qty 1

## 2018-05-14 MED ORDER — VERAPAMIL HCL ER 180 MG PO TBCR
180.0000 mg | EXTENDED_RELEASE_TABLET | Freq: Every day | ORAL | Status: DC
  Filled 2018-05-14 (×2): qty 1

## 2018-05-14 MED ORDER — INSULIN ASPART 100 UNIT/ML ~~LOC~~ SOLN
0.0000 [IU] | Freq: Three times a day (TID) | SUBCUTANEOUS | Status: DC
  Administered 2018-05-14: 20 [IU] via SUBCUTANEOUS
  Filled 2018-05-14: qty 1

## 2018-05-14 MED ORDER — INSULIN ASPART 100 UNIT/ML ~~LOC~~ SOLN: 50.0000 [IU] | Freq: Three times a day (TID) | SUBCUTANEOUS | Status: DC

## 2018-05-14 MED ORDER — INSULIN REGULAR HUMAN (CONC) 500 UNIT/ML ~~LOC~~ SOPN
50.0000 [IU] | PEN_INJECTOR | Freq: Three times a day (TID) | SUBCUTANEOUS | Status: DC
  Administered 2018-05-14 (×2): 50 [IU] via SUBCUTANEOUS
  Filled 2018-05-14: qty 3

## 2018-05-14 MED ORDER — ONDANSETRON HCL 4 MG/2ML IJ SOLN: 4.0000 mg | Freq: Four times a day (QID) | INTRAMUSCULAR | Status: DC | PRN

## 2018-05-14 MED ORDER — SODIUM CHLORIDE 0.9 % IV SOLN
INTRAVENOUS | Status: AC
  Administered 2018-05-14: 05:00:00 via INTRAVENOUS

## 2018-05-14 MED ORDER — INSULIN ASPART 100 UNIT/ML ~~LOC~~ SOLN: 80.0000 [IU] | Freq: Three times a day (TID) | SUBCUTANEOUS | Status: DC

## 2018-05-14 MED ORDER — FLUOXETINE HCL 20 MG PO CAPS
40.0000 mg | ORAL_CAPSULE | ORAL | Status: DC
  Administered 2018-05-14: 40 mg via ORAL
  Filled 2018-05-14: qty 2

## 2018-05-14 MED ORDER — ACETAMINOPHEN 325 MG PO TABS
650.0000 mg | ORAL_TABLET | Freq: Four times a day (QID) | ORAL | Status: DC | PRN
  Administered 2018-05-14: 05:00:00 650 mg via ORAL
  Filled 2018-05-14: qty 2

## 2018-05-14 MED ORDER — INSULIN GLARGINE 100 UNIT/ML ~~LOC~~ SOLN
20.0000 [IU] | Freq: Once | SUBCUTANEOUS | Status: DC
  Filled 2018-05-14: qty 0.2

## 2018-05-14 MED ORDER — ENOXAPARIN SODIUM 40 MG/0.4ML ~~LOC~~ SOLN: 40.0000 mg | SUBCUTANEOUS | Status: DC

## 2018-05-14 MED ORDER — INSULIN ASPART 100 UNIT/ML ~~LOC~~ SOLN
0.0000 [IU] | Freq: Every day | SUBCUTANEOUS | Status: DC
  Administered 2018-05-14: 21:00:00 3 [IU] via SUBCUTANEOUS
  Filled 2018-05-14: qty 1

## 2018-05-14 MED ORDER — INSULIN ASPART 100 UNIT/ML ~~LOC~~ SOLN: 10.0000 [IU] | Freq: Three times a day (TID) | SUBCUTANEOUS | Status: DC

## 2018-05-14 MED ORDER — ONDANSETRON HCL 4 MG PO TABS: 4.0000 mg | ORAL_TABLET | Freq: Four times a day (QID) | ORAL | Status: DC | PRN

## 2018-05-14 MED ORDER — ACETAMINOPHEN 650 MG RE SUPP: 650.0000 mg | Freq: Four times a day (QID) | RECTAL | Status: DC | PRN

## 2018-05-14 MED ORDER — FLUOXETINE HCL 20 MG PO CAPS: 20.0000 mg | ORAL_CAPSULE | Freq: Every day | ORAL | Status: DC

## 2018-05-14 MED ORDER — FLUOXETINE HCL 20 MG PO CAPS
20.0000 mg | ORAL_CAPSULE | ORAL | Status: DC
  Filled 2018-05-14: qty 1

## 2018-05-14 MED ORDER — INSULIN GLARGINE 100 UNIT/ML ~~LOC~~ SOLN
20.0000 [IU] | Freq: Every day | SUBCUTANEOUS | Status: DC
  Filled 2018-05-14: qty 0.2

## 2018-05-14 NOTE — Progress Notes (Addendum)
Inpatient Diabetes Program Recommendations  AACE/ADA: Consensus Statement on Inpatient Glycemic Control (2015)  Target Ranges:  Prepandial:   less than 140 mg/dL      Peak postprandial:   less than 180 mg/dL (1-2 hours)      Critically ill patients:  140 - 180 mg/dL      Results for SAVI, LASTINGER (MRN [id number]) as of 05/14/2018 09:09  Ref. Range 05/13/2018 23:35 05/14/2018 00:45 05/14/2018 01:53 05/14/2018 03:38 05/14/2018 08:08  Glucose-Capillary Latest Ref Range: 70 - 99 mg/dL 468 (H) 410 (H) 340 (H) 219 (H) 410 (H)   Admit with: DKA/diverticulitis  History: Diabetes, HTN  Home DM meds:U-500 Humulin R 100units 3 times a day  Current orders: Correction scale resistant (0-20) tid ac&hs  Crissie Figures, RN diabetes coordinator spoke with Dr. Bridgett Larsson by phone at 12 pm and discussed blood sugars. Dr. Bridgett Larsson requested pharmacy place orders for U-500 concentrated insulin 50 units 3x/day with meals. This is 1/2 of home dose of U-500 insulin. Penni Bombard RN spoke with pharmacy [facility]) and they will place orders to start with lunch today.    Addendum 8/13 1500: Spoke with patient about her home insulin. Takes the U-500 with vial / syringe and pulls up to the 20unit on the regular U-100 sq insulin syringe. (Dose is 100units of U-500 3x/day at home). RN giving patient first dose of hospital ordered U-500 (50 units before meals). States she is able to obtain insulin via PCP. No questions from patient about her diabetes care.   -- Will follow during hospitalization.--  Jonna Clark RN, MSN Diabetes Coordinator Inpatient Glycemic Control Team Team Pager: [phone] (8am-5pm)

## 2018-05-14 NOTE — Consult Note (Signed)
Pharmacy Antibiotic Note  Kristy [name] is a 67 y.o. female admitted on 05/13/2018 with intra-abdominal infection.  Pharmacy has been consulted for cipro dosing.  Plan: cipro 48m IV q 12 hr  Height: 5' 3"  (160 cm) Weight: 155 lb 9.6 oz (70.6 kg) IBW/kg (Calculated) : 52.4  Temp (24hrs), Avg:98.1 F (36.7 C), Min:97.6 F (36.4 C), Max:98.5 F (36.9 C)  Recent Labs  Lab 05/13/18 1842 05/13/18 2349 05/14/18 0446  WBC 18.4*  --  16.2*  CREATININE 1.53* 1.16* 1.06*    Estimated Creatinine Clearance: 49.2 mL/min (A) (by C-G formula based on SCr of 1.06 mg/dL (H)).    Allergies  Allergen Reactions  . Fish Oil Other (See Comments)    Gout  . Glimepiride     REACTION: hypoglycemia  . Guanfacine Hcl     REACTION: unspecified  . Rosiglitazone Other (See Comments)    CHF    Antimicrobials this admission: 08/13 cipro 04715>>  metronidazole 8/12 >>   Dose adjustments this admission:   Microbiology results:  Thank you for allowing pharmacy to be a part of this patient's care.  SPaticia Stack PharmD Pharmacy Resident  05/14/2018 9:02 AM   05/14/2018 9:01 AM

## 2018-05-14 NOTE — ED Notes (Signed)
Attempted to give report, was told nurse would call back.

## 2018-05-14 NOTE — Progress Notes (Signed)
[facility] [place] NAME: Kristy [name]    MR#:  [id number]  DATE OF BIRTH:  08-Apr-1951  SUBJECTIVE:  CHIEF COMPLAINT:   Chief Complaint  Patient presents with  . Abdominal Pain  . Rectal Bleeding   No abdominal pain, nausea, vomiting or bloody stool. REVIEW OF SYSTEMS:  Review of Systems  Constitutional: Positive for malaise/fatigue. Negative for chills and fever.  HENT: Negative for sore throat.   Eyes: Negative for blurred vision and double vision.  Respiratory: Negative for cough, hemoptysis, shortness of breath, wheezing and stridor.   Cardiovascular: Negative for chest pain, palpitations, orthopnea and leg swelling.  Gastrointestinal: Negative for abdominal pain, blood in stool, diarrhea, melena, nausea and vomiting.  Genitourinary: Negative for dysuria, flank pain and hematuria.  Musculoskeletal: Negative for back pain and joint pain.  Neurological: Negative for dizziness, sensory change, focal weakness, seizures, loss of consciousness, weakness and headaches.  Endo/Heme/Allergies: Negative for polydipsia.  Psychiatric/Behavioral: Negative for depression. The patient is not nervous/anxious.     DRUG ALLERGIES:   Allergies  Allergen Reactions  . Fish Oil Other (See Comments)    Gout  . Glimepiride     REACTION: hypoglycemia  . Guanfacine Hcl     REACTION: unspecified  . Rosiglitazone Other (See Comments)    CHF   VITALS:  Blood pressure (!) 147/71, pulse (!) 117, temperature 98.5 F (36.9 C), temperature source Oral, resp. rate 18, height 5' 3"  (1.6 m), weight 70.6 kg, SpO2 92 %. PHYSICAL EXAMINATION:  Physical Exam  Constitutional: She is oriented to person, place, and time. She appears well-nourished.  HENT:  Head: Normocephalic.  Mouth/Throat: Oropharynx is clear and moist.  Eyes: Pupils are equal, round, and reactive to light. Conjunctivae and EOM are normal. No scleral icterus.  Neck: Normal range of  motion. Neck supple. No JVD present. No tracheal deviation present.  Cardiovascular: Normal rate, regular rhythm and normal heart sounds. Exam reveals no gallop.  No murmur heard. Pulmonary/Chest: Effort normal and breath sounds normal. No respiratory distress. She has no wheezes. She has no rales.  Abdominal: Soft. Bowel sounds are normal. She exhibits no distension. There is no tenderness. There is no rebound.  Musculoskeletal: Normal range of motion. She exhibits no edema or tenderness.  Neurological: She is alert and oriented to person, place, and time. No cranial nerve deficit.  Skin: No rash noted. No erythema.  Psychiatric: She has a normal mood and affect.   LABORATORY PANEL:  Female CBC Recent Labs  Lab 05/14/18 0446  WBC 16.2*  HGB 13.8  HCT 41.4  PLT 158   ------------------------------------------------------------------------------------------------------------------ Chemistries  Recent Labs  Lab 05/13/18 1842  05/14/18 0446  NA 129*   < > 135  K 4.2   < > 3.5  CL 89*   < > 101  CO2 23   < > 21*  GLUCOSE 625*   < > 285*  BUN 30*   < > 21  CREATININE 1.53*   < > 1.06*  CALCIUM 10.0   < > 8.7*  AST 200*  --   --   ALT 78*  --   --   ALKPHOS 111  --   --   BILITOT 1.5*  --   --    < > = values in this interval not displayed.   RADIOLOGY:  Ct Abdomen Pelvis W Contrast  Result Date: 05/13/2018 CLINICAL DATA:  Sudden onset of abdominal pain with nausea and  vomiting, initial encounter EXAM: CT ABDOMEN AND PELVIS WITH CONTRAST TECHNIQUE: Multidetector CT imaging of the abdomen and pelvis was performed using the standard protocol following bolus administration of intravenous contrast. CONTRAST:  62m OMNIPAQUE IOHEXOL 300 MG/ML  SOLN COMPARISON:  12/04/2008 FINDINGS: Lower chest: No acute abnormality. Hepatobiliary: Liver is well visualized with mild decreased attenuation consistent with fatty infiltration. No hepatic mass is noted. Gallbladder is well distended with  dependent gallstones. No biliary ductal dilatation is seen. Pancreas: Unremarkable. No pancreatic ductal dilatation or surrounding inflammatory changes. Spleen: Normal in size without focal abnormality. Adrenals/Urinary Tract: Adrenal glands are within normal limits. The kidneys are well visualized bilaterally. No renal calculi or obstructive changes are seen. Left renal cyst is noted and enlarged in size now measuring 4.2 cm in greatest dimension. Bladder is well distended. Stomach/Bowel: Diverticular change of the colon is noted with evidence of mild early diverticulitis in the mid sigmoid colon. No perforation or abscess formation is noted. The appendix is not well visualized and may have been surgically removed. No inflammatory changes are identified. The small bowel is within normal limits. Vascular/Lymphatic: Aortic atherosclerosis. No enlarged abdominal or pelvic lymph nodes. Reproductive: Status post hysterectomy. No adnexal masses. Other: No abdominal wall hernia or abnormality. No abdominopelvic ascites. Musculoskeletal: No acute or significant osseous findings. IMPRESSION: Diverticulitis within the sigmoid colon without perforation or abscess formation. Cholelithiasis without complicating factors. Chronic changes without acute abnormality. Electronically Signed   By: MInez CatalinaM.D.   On: 05/13/2018 21:39   ASSESSMENT AND PLAN:     Diverticulitis with leukocytosis Continue Cipro and Flagyl, PRN antiemetics and PRN analgesia, follow-up CBC.    DKA (diabetic ketoacidoses) and diabetes 2. Improved with insulin drip, but glucose is still high at 410.  Start U-550 units AC.    AKI (acute kidney injury) improved with IV fluid support.   Essential hypertension -continue home meds   Coronary artery disease involving native heart without angina pectoris -continue home medications   Major depressive disorder, recurrent, in full remission ([place] -Home dose antidepressant  All the records are reviewed  and case discussed with Care Management/Social Worker. Management plans discussed with the patient, family and they are in agreement.  CODE STATUS: Full Code  TOTAL TIME TAKING CARE OF THIS PATIENT: 36 minutes.   More than 50% of the time was spent in counseling/coordination of care: YES  POSSIBLE D/C IN 2 DAYS, DEPENDING ON CLINICAL CONDITION.   QDemetrios LollM.D on 05/14/2018 at 1:32 PM  Between 7am to 6pm - Pager - [phone]  After 6pm go to [email or web address] - [email or web address]

## 2018-05-14 NOTE — Consult Note (Signed)
After discussion with the diabetes coordinator, it was decided upon a 50% reduction of home dose of 100 units TID with meals of U-500 to 50 units TID with meals. Insulin has been ordered.   Pharmacy will continue to monitor.  Paticia Stack, PharmD Pharmacy Resident  05/14/2018 1:04 PM

## 2018-05-15 ENCOUNTER — Other Ambulatory Visit: Payer: Self-pay | Admitting: *Deleted

## 2018-05-15 LAB — CBC
HCT: 37.8 % (ref 35.0–47.0)
HEMOGLOBIN: 12.6 g/dL (ref 12.0–16.0)
MCH: 30.6 pg (ref 26.0–34.0)
MCHC: 33.3 g/dL (ref 32.0–36.0)
MCV: 92 fL (ref 80.0–100.0)
PLATELETS: 116 10*3/uL — AB (ref 150–440)
RBC: 4.11 MIL/uL (ref 3.80–5.20)
RDW: 13.5 % (ref 11.5–14.5)
WBC: 10 10*3/uL (ref 3.6–11.0)

## 2018-05-15 LAB — GLUCOSE, CAPILLARY: GLUCOSE-CAPILLARY: 343 mg/dL — AB (ref 70–99)

## 2018-05-15 LAB — HIV ANTIBODY (ROUTINE TESTING W REFLEX): HIV Screen 4th Generation wRfx: NONREACTIVE

## 2018-05-15 MED ORDER — ENOXAPARIN SODIUM 40 MG/0.4ML ~~LOC~~ SOLN: 40.0000 mg | SUBCUTANEOUS | Status: DC

## 2018-05-15 MED ORDER — INSULIN REGULAR HUMAN (CONC) 500 UNIT/ML ~~LOC~~ SOPN
65.0000 [IU] | PEN_INJECTOR | Freq: Three times a day (TID) | SUBCUTANEOUS | Status: DC
  Administered 2018-05-15: 09:00:00 65 [IU] via SUBCUTANEOUS
  Filled 2018-05-15: qty 3

## 2018-05-15 MED ORDER — CIPROFLOXACIN HCL 500 MG PO TABS: 500.0000 mg | ORAL_TABLET | Freq: Two times a day (BID) | ORAL | 0 refills | Status: AC

## 2018-05-15 MED ORDER — METRONIDAZOLE 500 MG PO TABS: 500.0000 mg | ORAL_TABLET | Freq: Three times a day (TID) | ORAL | 0 refills | Status: AC

## 2018-05-15 MED ORDER — INSULIN REGULAR HUMAN (CONC) 500 UNIT/ML ~~LOC~~ SOLN: 120.0000 [IU] | Freq: Three times a day (TID) | SUBCUTANEOUS | 3 refills | Status: DC

## 2018-05-15 NOTE — Consult Note (Signed)
Pharmacy Antibiotic Note  Kristy [name] is a 67 y.o. female admitted on 05/13/2018 with intra-abdominal infection.  Pharmacy has been consulted for cipro dosing.  Plan: cipro 464m IV q 12 hr  Height: 5' 3"  (160 cm) Weight: 155 lb 9.6 oz (70.6 kg) IBW/kg (Calculated) : 52.4  Temp (24hrs), Avg:98.9 F (37.2 C), Min:98.2 F (36.8 C), Max:99.7 F (37.6 C)  Recent Labs  Lab 05/13/18 1842 05/13/18 2349 05/14/18 0446 05/15/18 0439  WBC 18.4*  --  16.2* 10.0  CREATININE 1.53* 1.16* 1.06*  --     Estimated Creatinine Clearance: 49.2 mL/min (A) (by C-G formula based on SCr of 1.06 mg/dL (H)).    Allergies  Allergen Reactions  . Fish Oil Other (See Comments)    Gout  . Glimepiride     REACTION: hypoglycemia  . Guanfacine Hcl     REACTION: unspecified  . Rosiglitazone Other (See Comments)    CHF    Antimicrobials this admission: cipro 0812 >>  metronidazole 8/12 >>   Dose adjustments this admission:   Microbiology results:  Thank you for allowing pharmacy to be a part of this patient's care.  SPaticia Stack PharmD Pharmacy Resident  05/15/2018 10:08 AM   05/15/2018 10:08 AM

## 2018-05-15 NOTE — Progress Notes (Signed)
Discharge instructions along with home medications and follow up gone over with patient. She verbalized that she understood instructions. No prescriptions given to patient. IV removed. Pt being discharged home on room air, no distress noted. Pt refused wheelchair and is ambulating to her car to drive herself home. She stated, "I am safe to drive, I feel better now than I normally do." Ammie Dalton, RN

## 2018-05-16 ENCOUNTER — Telehealth: Payer: Self-pay | Admitting: *Deleted

## 2018-05-16 NOTE — Telephone Encounter (Signed)
Received fax from [facility] requesting PA for Humulin R-500.  PA forms requested from [employer].  Forms completed and faxed back to [phone].  Awaiting decision.

## 2018-05-16 NOTE — Telephone Encounter (Signed)
Lm requesting return call to complete TCM and confirm hosp f/u appt

## 2018-05-17 NOTE — Telephone Encounter (Signed)
Transition Care Management Follow-up Telephone Call   Date discharged? 05/15/2018   How have you been since you were released from the hospital? "pretty good, just sleeping a lot"   Do you understand why you were in the hospital? yes   Do you understand the discharge instructions? yes   Where were you discharged to? home   Items Reviewed:  Medications reviewed: yes; lisinopril d/c per pt  Allergies reviewed: yes  Dietary changes reviewed: yes  Referrals reviewed: yes   Functional Questionnaire:   Activities of Daily Living (ADLs):   She states they are independent in the following: independent in all areas    Any transportation issues/concerns?: no   Any patient concerns? no   Confirmed importance and date/time of follow-up visits scheduled yes Provider Appointment booked with Dr Lorelei Pont 8/21 @ 1140  Confirmed with patient if condition begins to worsen call PCP or go to the ER.  Patient was given the office number and encouraged to call back with question or concerns.  : yes

## 2018-05-17 NOTE — Telephone Encounter (Signed)
PA approved effective 09/30/2017 through 10/01/2018.  [facility] notified of approval via fax.

## 2018-05-21 LAB — BLOOD GAS, VENOUS
Acid-base deficit: 9.1 mmol/L — ABNORMAL HIGH (ref 0.0–2.0)
BICARBONATE: 16.9 mmol/L — AB (ref 20.0–28.0)
Patient temperature: 37
pCO2, Ven: 36 mmHg — ABNORMAL LOW (ref 44.0–60.0)
pH, Ven: 7.28 (ref 7.250–7.430)

## 2018-05-21 NOTE — Progress Notes (Signed)
Dr. [name] T. Ricky Doan, MD, [facility] [place], [address] Phone: [phone] Fax: [phone]  05/22/2018  Patient: Kristy [name], MRN: [id number], DOB: 18-Aug-1951, 67 y.o.  Primary Physician:  Owens Loffler, MD   Chief Complaint  Patient presents with  . Hospitalization Follow-up   Subjective:   Kristy [name] is a 67 y.o. very pleasant female patient who presents with the following:  Hospital follow-up for acute DKA, acute diverticulitis, AKI:  05/13/2018 -  05/15/2018  F/u BMP needed  Finished all antibiotics - flagyl and cipro and upset.   Went to the walk-in clinic and then went to the hospital and got admitted.   BS is high.   The hospital stopped her lisinopril. She does have DM and CAD and currently has high BP.  She feels well right now.   Past Medical History, Surgical History, Social History, Family History, Problem List, Medications, and Allergies have been reviewed and updated if relevant.  Patient Active Problem List   Diagnosis Date Noted  . DKA (diabetic ketoacidoses) ([place]) 05/13/2018  . AKI (acute kidney injury) ([place]) 05/13/2018  . Coronary artery disease involving native heart without angina pectoris 09/13/2016  . Primary osteoarthritis involving multiple joints 03/14/2015  . Personal history of other malignant neoplasm of skin 12/01/2014  . Crohn's disease ([place]) 10/13/2013  . Major depressive disorder, recurrent, in full remission ([place]) 05/01/2011  . TRANSAMINASES, SERUM, ELEVATED 05/01/2010  . PSORIASIS 08/02/2009  . Hyperlipidemia 05/11/2009  . IRRITABLE BOWEL SYNDROME 03/10/2009  . GOUT 02/05/2009  . Type 2 diabetes mellitus with vascular disease ([place])   . Essential hypertension 04/18/2007  . LBBB (left bundle branch block) 04/18/2007  . ALLERGIC RHINITIS 04/18/2007  . GALLSTONES 04/18/2007  . Diverticulitis 04/18/2007    Past Medical History:  Diagnosis Date  .  Allergic rhinitis   . Allergy   . Cardiomyopathy    mild nonischemic  . Cataract    mild   . Chronic systolic congestive heart failure ([place]) 01/11/2009   Qualifier: Diagnosis of  By: Silvio Pate MD, [facility]   . Crohn's disease ([facility]) 10/13/2013  . Diabetes mellitus   . Diverticulosis   . GERD (gastroesophageal reflux disease)   . Gout   . Hyperlipidemia   . Hypertension   . IBS (irritable bowel syndrome)   . Left bundle branch block   . Neuromuscular disorder (H[facility])    neuropathy  . Osteoarthritis   . Symptomatic cholelithiasis     Past Surgical History:  Procedure Laterality Date  . COLONOSCOPY    . DILATION AND CURETTAGE OF UTERUS    . SHOULDER SURGERY     15 + yrs ago   . SKIN SURGERY     nose   . VAGINAL HYSTERECTOMY      Social History   Socioeconomic History  . Marital status: Married    Spouse name: Not on file  . Number of children: Not on file  . Years of education: Not on file  . Highest education level: Not on file  Occupational History  . Not on file  Social Needs  . Financial resource strain: Not on file  . Food insecurity:    Worry: Not on file    Inability: Not on file  . Transportation needs:    Medical: Not on file    Non-medical: Not on file  Tobacco Use  . Smoking status: Former Smoker    Packs/day: 0.75  Years: 30.00    Pack years: 22.50    Types: Cigarettes    Last attempt to quit: 06/06/1997    Years since quitting: 20.9  . Smokeless tobacco: Never Used  Substance and Sexual Activity  . Alcohol use: No  . Drug use: No  . Sexual activity: Not on file  Lifestyle  . Physical activity:    Days per week: Not on file    Minutes per session: Not on file  . Stress: Not on file  Relationships  . Social connections:    Talks on phone: Not on file    Gets together: Not on file    Attends religious service: Not on file    Active member of club or organization: Not on file    Attends meetings of clubs or organizations: Not on file     Relationship status: Not on file  . Intimate partner violence:    Fear of current or ex partner: Not on file    Emotionally abused: Not on file    Physically abused: Not on file    Forced sexual activity: Not on file  Other Topics Concern  . Not on file  Social History Narrative  . Not on file    Family History  Problem Relation Age of Onset  . Hypertension Father   . Colon cancer Neg Hx   . Colon polyps Neg Hx   . Rectal cancer Neg Hx   . Stomach cancer Neg Hx     Allergies  Allergen Reactions  . Fish Oil Other (See Comments)    Gout  . Glimepiride     REACTION: hypoglycemia  . Guanfacine Hcl     REACTION: unspecified  . Rosiglitazone Other (See Comments)    CHF    Medication list reviewed and updated in full in [facility].   GEN: No acute illnesses, no fevers, chills. GI: No n/v/d, eating normally Pulm: No SOB Interactive and getting along well at home.  Otherwise, ROS is as per the HPI.  Objective:   BP (!) 160/90   Pulse (!) 111   Temp 98.8 F (37.1 C) (Oral)   Ht 5' 2.75" (1.594 m)   Wt 156 lb 8 oz (71 kg)   BMI 27.94 kg/m   GEN: WDWN, NAD, Non-toxic, A & O x 3 HEENT: Atraumatic, Normocephalic. Neck supple. No masses, No LAD. Ears and Nose: No external deformity. CV: RRR, No M/G/R. No JVD. No thrill. No extra heart sounds. PULM: CTA B, no wheezes, crackles, rhonchi. No retractions. No resp. distress. No accessory muscle use. EXTR: No c/c/e NEURO Normal gait.  PSYCH: Normally interactive. Conversant. Not depressed or anxious appearing.  Calm demeanor.   Laboratory and Imaging Data:  Assessment and Plan:   Acute diverticulitis - Plan: Basic metabolic panel, Basic metabolic panel  Type 2 diabetes mellitus with vascular disease ([place]) - Plan: Basic metabolic panel, Basic metabolic panel, CANCELED: Basic metabolic panel  AKI (acute kidney injury) ([facility]) - Plan: Basic metabolic panel, Basic metabolic panel, CANCELED: Basic metabolic  panel  Essential hypertension  Check renal function  DM very out of control. Continue to use u-500 insulin  Restart Ace inhibitor in a DM patient with high BP.  Follow-up: 1 month  No orders of the defined types were placed in this encounter.  Orders Placed This Encounter  Procedures  . Basic metabolic panel    Signed,  [name] T. Jaylen Claude, MD   Allergies as of 05/22/2018  Reactions   Fish Oil Other (See Comments)   Gout   Glimepiride    REACTION: hypoglycemia   Guanfacine Hcl    REACTION: unspecified   Rosiglitazone Other (See Comments)   CHF      Medication List        Accurate as of 05/22/18  2:03 PM. Always use your most recent med list.          ALIGN 4 MG Caps Take 1 capsule by mouth daily.   FLUoxetine 20 MG capsule Commonly known as:  PROZAC Alternate 2 tabs daily and then 1 tab the next day   insulin regular human CONCENTRATED 500 UNIT/ML injection Commonly known as:  HUMULIN R Inject 0.24 mLs (120 Units total) into the skin 3 (three) times daily with meals.   Insulin Syringe-Needle U-100 31G X 5/16" 0.5 ML Misc USE AS DIRECTED THREE TIMES A DAY   NONFORMULARY OR COMPOUNDED ITEM See pharmacy note   ONE TOUCH ULTRA TEST test strip Generic drug:  glucose blood USE AS PER PACKAGE INSTRUCTIONS CHECK SUGARS UP TO-FOUR TIMES A DAY OR AS PRESCRIBED   verapamil 180 MG 24 hr capsule Commonly known as:  VERELAN PM Take 1 capsule (180 mg total) by mouth at bedtime.

## 2018-05-22 ENCOUNTER — Ambulatory Visit (INDEPENDENT_AMBULATORY_CARE_PROVIDER_SITE_OTHER): Payer: Medicare HMO | Admitting: Family Medicine

## 2018-05-22 ENCOUNTER — Encounter: Payer: Self-pay | Admitting: Family Medicine

## 2018-05-22 VITALS — BP 160/90 | HR 111 | Temp 98.8°F | Ht 62.75 in | Wt 156.5 lb

## 2018-05-22 DIAGNOSIS — I10 Essential (primary) hypertension: Secondary | ICD-10-CM | POA: Diagnosis not present

## 2018-05-22 DIAGNOSIS — E1159 Type 2 diabetes mellitus with other circulatory complications: Secondary | ICD-10-CM | POA: Diagnosis not present

## 2018-05-22 DIAGNOSIS — N179 Acute kidney failure, unspecified: Secondary | ICD-10-CM

## 2018-05-22 DIAGNOSIS — K5792 Diverticulitis of intestine, part unspecified, without perforation or abscess without bleeding: Secondary | ICD-10-CM

## 2018-05-22 LAB — BASIC METABOLIC PANEL
BUN: 10 mg/dL (ref 6–23)
CHLORIDE: 96 meq/L (ref 96–112)
CO2: 30 meq/L (ref 19–32)
CREATININE: 0.83 mg/dL (ref 0.40–1.20)
Calcium: 9.9 mg/dL (ref 8.4–10.5)
GFR: 72.89 mL/min (ref >60.00)
Glucose, Bld: 409 mg/dL — ABNORMAL HIGH (ref 70–99)
POTASSIUM: 4.1 meq/L (ref 3.5–5.1)
Sodium: 134 mEq/L — ABNORMAL LOW (ref 135–145)

## 2018-05-22 NOTE — Patient Instructions (Signed)
Lisinopril - start back taking 1/2 tablet a day

## 2018-05-25 ENCOUNTER — Other Ambulatory Visit: Payer: Self-pay | Admitting: Cardiovascular Disease

## 2018-05-29 NOTE — Discharge Summary (Signed)
[facility] [place] NAME: Kristy [name]    MR#:  [id number]  DATE OF BIRTH:  April 29, 1951  DATE OF ADMISSION:  05/13/2018 ADMITTING PHYSICIAN: Lance Coon, MD  DATE OF DISCHARGE: 05/15/2018 11:00 AM  PRIMARY CARE PHYSICIAN: Owens Loffler, MD   ADMISSION DIAGNOSIS:  Diverticulitis [K57.92] Diabetic ketoacidosis without coma associated with type 1 diabetes mellitus ([facility]) [E10.10]  DISCHARGE DIAGNOSIS:  Principal Problem:   Diverticulitis Active Problems:   Hyperlipidemia   Essential hypertension   Major depressive disorder, recurrent, in full remission ([place])   Coronary artery disease involving native heart without angina pectoris   DKA (diabetic ketoacidoses) (H[facility])   AKI (acute kidney injury) ([place])   SECONDARY DIAGNOSIS:   Past Medical History:  Diagnosis Date  . Allergic rhinitis   . Allergy   . Cardiomyopathy    mild nonischemic  . Cataract    mild   . Chronic systolic congestive heart failure ([place]) 01/11/2009   Qualifier: Diagnosis of  By: Silvio Pate MD, [facility]   . Crohn's disease ([place]) 10/13/2013  . Diabetes mellitus   . Diverticulosis   . GERD (gastroesophageal reflux disease)   . Gout   . Hyperlipidemia   . Hypertension   . IBS (irritable bowel syndrome)   . Left bundle branch block   . Neuromuscular disorder (HCC)    neuropathy  . Osteoarthritis   . Symptomatic cholelithiasis      ADMITTING HISTORY  HISTORY OF PRESENT ILLNESS:  Kristy [name]  is a 67 y.o. female who presents with chief complaint as above.  She states she developed abdominal pain over the past couple of days, but it got significantly worse today.  She came to ED for evaluation and was found to have diverticulitis on imaging here.  She is also found to be in mild DKA.  Treatment was started in the ED and hospitalist were called for admission  HOSPITAL COURSE:   *Diverticulitis.  Treated with ciprofloxacin and Flagyl.  As needed antiemetics  and pain medications.  By day of discharge her abdominal pain has resolved.  No further vomiting.  Tolerated diet.  Discharged home on oral ciprofloxacin and Flagyl for 5 more days.  *DKA with diabetes mellitus type 2.  Patient initially was treated with insulin drip in the ICU.  Later transition to medical floor.  Blood sugars are much improved after restarting her U5 100 insulin.  Acute kidney injury due to dehydration has resolved with IV fluids  Patient discharged home in stable condition with insulin, oral antibiotics.  CONSULTS OBTAINED:    DRUG ALLERGIES:   Allergies  Allergen Reactions  . Fish Oil Other (See Comments)    Gout  . Glimepiride     REACTION: hypoglycemia  . Guanfacine Hcl     REACTION: unspecified  . Rosiglitazone Other (See Comments)    CHF    DISCHARGE MEDICATIONS:   Allergies as of 05/15/2018      Reactions   Fish Oil Other (See Comments)   Gout   Glimepiride    REACTION: hypoglycemia   Guanfacine Hcl    REACTION: unspecified   Rosiglitazone Other (See Comments)   CHF      Medication List    STOP taking these medications   COLCRYS 0.6 MG tablet Generic drug:  colchicine   lisinopril 40 MG tablet Commonly known as:  PRINIVIL,ZESTRIL   traMADol 50 MG tablet Commonly known as:  ULTRAM     TAKE these medications  ALIGN 4 MG Caps Take 1 capsule by mouth daily.   FLUoxetine 20 MG capsule Commonly known as:  PROZAC Alternate 2 tabs daily and then 1 tab the next day   Insulin Syringe-Needle U-100 31G X 5/16" 0.5 ML Misc USE AS DIRECTED THREE TIMES A DAY   NONFORMULARY OR COMPOUNDED ITEM See pharmacy note What changed:    how much to take  how to take this  when to take this   ONE TOUCH ULTRA TEST test strip Generic drug:  glucose blood USE AS PER PACKAGE INSTRUCTIONS CHECK SUGARS UP TO-FOUR TIMES A DAY OR AS PRESCRIBED   verapamil 180 MG 24 hr capsule Commonly known as:  VERELAN PM Take 1 capsule (180 mg total) by mouth at  bedtime.     ASK your doctor about these medications   ciprofloxacin 500 MG tablet Commonly known as:  CIPRO Take 1 tablet (500 mg total) by mouth 2 (two) times daily for 5 days. Ask about: Should I take this medication?   metroNIDAZOLE 500 MG tablet Commonly known as:  FLAGYL Take 1 tablet (500 mg total) by mouth 3 (three) times daily for 5 days. Ask about: Should I take this medication?       Today   VITAL SIGNS:  Blood pressure (!) 144/70, pulse 64, temperature 98.2 F (36.8 C), temperature source Oral, resp. rate 14, height 5' 3"  (1.6 m), weight 70.6 kg, SpO2 93 %.  I/O:  No intake or output data in the 24 hours ending 05/29/18 1347  PHYSICAL EXAMINATION:  Physical Exam  GENERAL:  67 y.o.-year-old patient lying in the bed with no acute distress.  LUNGS: Normal breath sounds bilaterally, no wheezing, rales,rhonchi or crepitation. No use of accessory muscles of respiration.  CARDIOVASCULAR: S1, S2 normal. No murmurs, rubs, or gallops.  ABDOMEN: Soft, non-tender, non-distended. Bowel sounds present. No organomegaly or mass.  NEUROLOGIC: Moves all 4 extremities. PSYCHIATRIC: The patient is alert and oriented x 3.  SKIN: No obvious rash, lesion, or ulcer.   DATA REVIEW:   CBC No results for input(s): WBC, HGB, HCT, PLT in the last 168 hours.  Chemistries  No results for input(s): NA, K, CL, CO2, GLUCOSE, BUN, CREATININE, CALCIUM, MG, AST, ALT, ALKPHOS, BILITOT in the last 168 hours.  Invalid input(s): GFRCGP  Cardiac Enzymes No results for input(s): TROPONINI in the last 168 hours.  Microbiology Results  No results found for this or any previous visit.  RADIOLOGY:  No results found.  Follow up with PCP in 1 week.  Management plans discussed with the patient, family and they are in agreement.  CODE STATUS:  Code Status History    Date Active Date Inactive Code Status Order ID Comments User Context   05/14/2018 0401 05/15/2018 1421 Full Code [id number]   Lance Coon, MD Inpatient      TOTAL TIME TAKING CARE OF THIS PATIENT ON DAY OF DISCHARGE: more than 30 minutes.   Leia Alf Rashaunda Rahl M.D on 05/29/2018 at 1:47 PM  Between 7am to 6pm - Pager - [phone]  After 6pm go to [email or web address] - password [email or web address] [facility]  Office  [phone]  CC: Primary care physician; Owens Loffler, MD  Note: This dictation was prepared with Dragon dictation along with smaller phrase technology. Any transcriptional errors that result from this process are unintentional.

## 2018-06-10 DIAGNOSIS — R69 Illness, unspecified: Secondary | ICD-10-CM | POA: Diagnosis not present

## 2018-06-18 DIAGNOSIS — I10 Essential (primary) hypertension: Secondary | ICD-10-CM | POA: Diagnosis not present

## 2018-06-18 DIAGNOSIS — Z794 Long term (current) use of insulin: Secondary | ICD-10-CM | POA: Diagnosis not present

## 2018-06-18 DIAGNOSIS — I25119 Atherosclerotic heart disease of native coronary artery with unspecified angina pectoris: Secondary | ICD-10-CM | POA: Diagnosis not present

## 2018-06-18 DIAGNOSIS — E1165 Type 2 diabetes mellitus with hyperglycemia: Secondary | ICD-10-CM | POA: Diagnosis not present

## 2018-06-18 DIAGNOSIS — E1142 Type 2 diabetes mellitus with diabetic polyneuropathy: Secondary | ICD-10-CM | POA: Diagnosis not present

## 2018-06-18 DIAGNOSIS — I471 Supraventricular tachycardia: Secondary | ICD-10-CM | POA: Diagnosis not present

## 2018-06-18 DIAGNOSIS — R69 Illness, unspecified: Secondary | ICD-10-CM | POA: Diagnosis not present

## 2018-06-18 DIAGNOSIS — K5090 Crohn's disease, unspecified, without complications: Secondary | ICD-10-CM | POA: Diagnosis not present

## 2018-06-18 DIAGNOSIS — E1159 Type 2 diabetes mellitus with other circulatory complications: Secondary | ICD-10-CM | POA: Diagnosis not present

## 2018-06-18 DIAGNOSIS — G8929 Other chronic pain: Secondary | ICD-10-CM | POA: Diagnosis not present

## 2018-07-11 ENCOUNTER — Other Ambulatory Visit (INDEPENDENT_AMBULATORY_CARE_PROVIDER_SITE_OTHER): Payer: Medicare HMO

## 2018-07-11 DIAGNOSIS — E785 Hyperlipidemia, unspecified: Secondary | ICD-10-CM | POA: Diagnosis not present

## 2018-07-11 DIAGNOSIS — Z79899 Other long term (current) drug therapy: Secondary | ICD-10-CM | POA: Diagnosis not present

## 2018-07-11 DIAGNOSIS — R5383 Other fatigue: Secondary | ICD-10-CM | POA: Diagnosis not present

## 2018-07-11 DIAGNOSIS — E119 Type 2 diabetes mellitus without complications: Secondary | ICD-10-CM

## 2018-07-11 LAB — HEPATIC FUNCTION PANEL
ALK PHOS: 98 U/L (ref 39–117)
ALT: 36 U/L — ABNORMAL HIGH (ref 0–35)
AST: 39 U/L — ABNORMAL HIGH (ref 0–37)
Albumin: 3.8 g/dL (ref 3.5–5.2)
BILIRUBIN DIRECT: 0.1 mg/dL (ref 0.0–0.3)
TOTAL PROTEIN: 6.7 g/dL (ref 6.0–8.3)
Total Bilirubin: 0.5 mg/dL (ref 0.2–1.2)

## 2018-07-11 LAB — HEMOGLOBIN A1C: Hgb A1c MFr Bld: 13.1 % — ABNORMAL HIGH (ref 4.6–6.5)

## 2018-07-11 LAB — CBC WITH DIFFERENTIAL/PLATELET
BASOS PCT: 0.5 % (ref 0.0–3.0)
Basophils Absolute: 0 10*3/uL (ref 0.0–0.1)
EOS ABS: 0.3 10*3/uL (ref 0.0–0.7)
EOS PCT: 3 % (ref 0.0–5.0)
HEMATOCRIT: 45 % (ref 36.0–46.0)
HEMOGLOBIN: 14.7 g/dL (ref 12.0–15.0)
LYMPHS PCT: 42.3 % (ref 12.0–46.0)
Lymphs Abs: 3.9 10*3/uL (ref 0.7–4.0)
MCHC: 32.8 g/dL (ref 30.0–36.0)
MCV: 90.4 fl (ref 78.0–100.0)
MONO ABS: 1 10*3/uL (ref 0.1–1.0)
Monocytes Relative: 10.7 % (ref 3.0–12.0)
NEUTROS ABS: 4.1 10*3/uL (ref 1.4–7.7)
Neutrophils Relative %: 43.5 % (ref 43.0–77.0)
PLATELETS: 204 10*3/uL (ref 150.0–400.0)
RBC: 4.98 Mil/uL (ref 3.87–5.11)
RDW: 13.6 % (ref 11.5–15.5)
WBC: 9.3 10*3/uL (ref 4.0–10.5)

## 2018-07-11 LAB — LIPID PANEL
CHOL/HDL RATIO: 7
CHOLESTEROL: 263 mg/dL — AB (ref 0–200)
HDL: 36.9 mg/dL — ABNORMAL LOW (ref >39.00)
NONHDL: 225.63
Triglycerides: 244 mg/dL — ABNORMAL HIGH (ref 0.0–149.0)
VLDL: 48.8 mg/dL — AB (ref 0.0–40.0)

## 2018-07-11 LAB — BASIC METABOLIC PANEL
BUN: 10 mg/dL (ref 6–23)
CHLORIDE: 99 meq/L (ref 96–112)
CO2: 32 meq/L (ref 19–32)
CREATININE: 0.7 mg/dL (ref 0.40–1.20)
Calcium: 9.6 mg/dL (ref 8.4–10.5)
GFR: 88.69 mL/min (ref >60.00)
Glucose, Bld: 154 mg/dL — ABNORMAL HIGH (ref 70–99)
POTASSIUM: 3.3 meq/L — AB (ref 3.5–5.1)
Sodium: 140 mEq/L (ref 135–145)

## 2018-07-11 LAB — LDL CHOLESTEROL, DIRECT: Direct LDL: 192 mg/dL

## 2018-07-11 LAB — TSH: TSH: 3.88 u[IU]/mL (ref 0.35–4.50)

## 2018-07-15 ENCOUNTER — Other Ambulatory Visit: Payer: Medicare HMO

## 2018-07-17 ENCOUNTER — Encounter: Payer: Medicare HMO | Admitting: Family Medicine

## 2018-07-23 ENCOUNTER — Ambulatory Visit: Payer: Medicare HMO | Admitting: Podiatry

## 2018-07-23 DIAGNOSIS — R69 Illness, unspecified: Secondary | ICD-10-CM | POA: Diagnosis not present

## 2018-08-02 ENCOUNTER — Telehealth: Payer: Self-pay | Admitting: Family Medicine

## 2018-08-02 ENCOUNTER — Ambulatory Visit: Payer: Medicare HMO | Admitting: Podiatry

## 2018-08-02 ENCOUNTER — Encounter: Payer: Self-pay | Admitting: Podiatry

## 2018-08-02 DIAGNOSIS — E0843 Diabetes mellitus due to underlying condition with diabetic autonomic (poly)neuropathy: Secondary | ICD-10-CM

## 2018-08-02 NOTE — Telephone Encounter (Signed)
Left message asking pt to call office.  See if pt can come in 08/19/18 @ 10:20 with dr copland instead of afternoon appointment.  If pt cannot do that time please r/s appointment to sometime in dec.  Due to dr copland having a limited schedule

## 2018-08-05 NOTE — Progress Notes (Signed)
   HPI: 67 year old female with PMHx of DM presenting today for her yearly exam. She has no complaints at this time. She has been using the cream provided to her at last visit. Patient is here for further evaluation and treatment.   Past Medical History:  Diagnosis Date  . Allergic rhinitis   . Allergy   . Cardiomyopathy    mild nonischemic  . Cataract    mild   . Chronic systolic congestive heart failure ([place]) 01/11/2009   Qualifier: Diagnosis of  By: Silvio Pate MD, [facility]   . Crohn's disease ([facility]) 10/13/2013  . Diabetes mellitus   . Diverticulosis   . GERD (gastroesophageal reflux disease)   . Gout   . Hyperlipidemia   . Hypertension   . IBS (irritable bowel syndrome)   . Left bundle branch block   . Neuromuscular disorder (HCC)    neuropathy  . Osteoarthritis   . Symptomatic cholelithiasis      Physical Exam: General: The patient is alert and oriented x3 in no acute distress.  Dermatology: Skin is warm, dry and supple bilateral lower extremities. Negative for open lesions or macerations.  Vascular: Palpable pedal pulses bilaterally. No edema or erythema noted. Capillary refill within normal limits.  Neurological: Epicritic and protective threshold diminished bilaterally.   Musculoskeletal Exam: Range of motion within normal limits to all pedal and ankle joints bilateral. Muscle strength 5/5 in all groups bilateral.   Assessment: - DM with polyneuropathy   Plan of Care:  - Patient evaluated. - Continue topical neuropathy cream from [employer].  - Recommended good shoe gear.  - Return to clinic annually.    Edrick Kins, DPM [facility]  Dr. Edrick Kins, DPM    [address], [place] [address]                Office [phone]  Fax [phone]

## 2018-08-05 NOTE — Progress Notes (Signed)
Cardiology Office Note Date:  08/06/2018  Patient ID:  Kristy [name], Kristy [name] 06-24-1951, MRN [id number] PCP:  Owens Loffler, MD  Cardiologist:  Dr. Rockey Situ, MD    Chief Complaint: Follow up  History of Present Illness: Kristy [name] is a 66 y.o. female with history of nonobstructive CAD by [place] in 2010, nonischemic cardiomyopathy with subsequent improvement in EF by echo in 07/2009, left bundle branch block, sinus tachycardia, Crohn's disease, poorly controlled diabetes, hypertension, hyperlipidemia, and tobacco abuse who presents for follow-up of hypertension and sinus tachycardia.  Cardiac catheterization on 01/07/2009 showed a right dominant system with the left main demonstrating no significant disease, LAD no significant disease, mild luminal irregularities and the LCx, mild luminal irregularities in the RCA, EF by LV gram estimated to be approximately 45% with hypokinesis of the inferior wall.  Patient was felt to have a mild nonischemic cardiomyopathy which may be related to the patient's known poorly controlled diabetes with small vessel disease.  Medical management was recommended.  Follow-up echocardiogram in 07/2009 demonstrated an EF of 50 to 55% with dyssynergy secondary to left bundle branch block.  She has had chronic sinus tachycardia and is intolerant to beta-blockers.  This has been managed with verapamil.  In follow-up with Dr. Rockey Situ in 11/2017 she continued to note heart rate consistently 100 bpm or greater.  She declined further medications.  She has had a difficult control in managing her diabetes.  She has previously declined statins.  She was admitted to the hospital in 05/2018 with diabetic ketoacidosis.  In this setting, she was treated for diverticulitis, DKA, and AKI by internal medicine.  Labs: 07/2018 - direct LDL 192, A1c 13.1, TSH 3.88, CBC unremarkable, potassium 3.3, serum creatinine 0.7, AST 39, ALT 36 which were both improved from 278 respectively.  Patient  comes in doing well today.  Since her hospital admission in 05/2018 she has resumed lisinopril at 20 mg daily (was previously on 40 mg daily) secondary to elevated blood pressures in the 409W to 119J systolic.  With this, she has noted some improvement in BP though systolics continue to run in the 140s.  She has also noted significant improvement in shortness of breath and "vascular congestion."  She denies any chest pain, palpitations, dizziness, presyncope, or syncope.  She indicates she has refused a second opinion for her diabetes at [facility] stating "I know my body."  She states "I am an alien."  She indicates to me she takes 300 to 400 units of Humulin on a daily basis with blood sugars consistently 200 in the morning and 600 in the evening.  She would like to increase her lisinopril back to 40 mg daily.  Past Medical History:  Diagnosis Date  . Allergic rhinitis   . Allergy   . Cardiomyopathy    mild nonischemic  . Cataract    mild   . Chronic systolic congestive heart failure ([place]) 01/11/2009   Qualifier: Diagnosis of  By: Silvio Pate MD, [facility]   . Crohn's disease ([place]) 10/13/2013  . Diabetes mellitus   . Diverticulosis   . GERD (gastroesophageal reflux disease)   . Gout   . Hyperlipidemia   . Hypertension   . IBS (irritable bowel syndrome)   . Left bundle branch block   . Neuromuscular disorder ([facility])    neuropathy  . Osteoarthritis   . Symptomatic cholelithiasis     Past Surgical History:  Procedure Laterality Date  . COLONOSCOPY    . DILATION AND CURETTAGE  OF UTERUS    . SHOULDER SURGERY     15 + yrs ago   . SKIN SURGERY     nose   . VAGINAL HYSTERECTOMY      Current Meds  Medication Sig  . FLUoxetine (PROZAC) 20 MG capsule Alternate 2 tabs daily and then 1 tab the next day  . insulin regular human CONCENTRATED (HUMULIN R) 500 UNIT/ML injection Inject 0.24 mLs (120 Units total) into the skin 3 (three) times daily with meals.  . Insulin Syringe-Needle U-100 (B-D INS  SYRINGE 0.5CC/31GX5/16) 31G X 5/16" 0.5 ML MISC USE AS DIRECTED THREE TIMES A DAY  . lisinopril (PRINIVIL,ZESTRIL) 40 MG tablet Take 20 mg by mouth daily.  . NONFORMULARY OR COMPOUNDED ITEM See pharmacy note (Patient taking differently: Apply 1 application topically at bedtime. See pharmacy note)  . ONE TOUCH ULTRA TEST test strip USE AS PER PACKAGE INSTRUCTIONS CHECK SUGARS UP TO-FOUR TIMES A DAY OR AS PRESCRIBED  . Probiotic Product (ALIGN) 4 MG CAPS Take 1 capsule by mouth daily.   . verapamil (VERELAN PM) 180 MG 24 hr capsule Take 1 capsule (180 mg total) by mouth at bedtime.    Allergies:   Fish oil; Glimepiride; Guanfacine hcl; and Rosiglitazone   Social History:  The patient  reports that she quit smoking about 21 years ago. Her smoking use included cigarettes. She has a 22.50 pack-year smoking history. She has never used smokeless tobacco. She reports that she does not drink alcohol or use drugs.   Family History:  The patient's family history includes Hypertension in her father.  ROS:   Review of Systems  Constitutional: Positive for malaise/fatigue. Negative for chills, diaphoresis, fever and weight loss.  HENT: Negative for congestion.   Eyes: Negative for discharge and redness.  Respiratory: Negative for cough, hemoptysis, sputum production, shortness of breath and wheezing.   Cardiovascular: Negative for chest pain, palpitations, orthopnea, claudication, leg swelling and PND.  Gastrointestinal: Negative for abdominal pain, blood in stool, heartburn, melena, nausea and vomiting.  Genitourinary: Negative for hematuria.  Musculoskeletal: Negative for falls and myalgias.  Skin: Negative for rash.  Neurological: Negative for dizziness, tingling, tremors, sensory change, speech change, focal weakness, loss of consciousness and weakness.  Endo/Heme/Allergies: Does not bruise/bleed easily.  Psychiatric/Behavioral: Negative for substance abuse. The patient is not nervous/anxious.     All other systems reviewed and are negative.    PHYSICAL EXAM:  VS:  BP (!) 148/88 (BP Location: Left Arm, Patient Position: Sitting, Cuff Size: Normal)   Pulse (!) 103   Ht 5' 2"  (1.575 m)   Wt 157 lb (71.2 kg)   BMI 28.72 kg/m  BMI: Body mass index is 28.72 kg/m.  Physical Exam  Constitutional: She is oriented to person, place, and time. She appears well-developed and well-nourished.  HENT:  Head: Normocephalic and atraumatic.  Eyes: Right eye exhibits no discharge. Left eye exhibits no discharge.  Neck: Normal range of motion. No JVD present.  Cardiovascular: Regular rhythm, S1 normal, S2 normal and normal heart sounds. Tachycardia present. Exam reveals no distant heart sounds, no friction rub, no midsystolic click and no opening snap.  No murmur heard. Pulses:      Dorsalis pedis pulses are 2+ on the right side, and 2+ on the left side.       Posterior tibial pulses are 2+ on the right side, and 2+ on the left side.  Pulmonary/Chest: Effort normal and breath sounds normal. No respiratory distress. She has no decreased  breath sounds. She has no wheezes. She has no rales. She exhibits no tenderness.  Abdominal: Soft. She exhibits no distension. There is no tenderness.  Musculoskeletal: She exhibits no edema.  Neurological: She is alert and oriented to person, place, and time.  Skin: Skin is warm and dry. No cyanosis. Nails show no clubbing.  Psychiatric: She has a normal mood and affect. Her speech is normal and behavior is normal. Judgment and thought content normal.     EKG:  Was ordered and interpreted by me today. Shows a tachycardic, 103 bpm, LBBB (known)  Recent Labs: 07/11/2018: ALT 36; BUN 10; Creatinine, Ser 0.70; Hemoglobin 14.7; Platelets 204.0; Potassium 3.3; Sodium 140; TSH 3.88  07/11/2018: Cholesterol 263; Direct LDL 192.0; HDL 36.90; Total CHOL/HDL Ratio 7; Triglycerides 244.0; VLDL 48.8   CrCl cannot be calculated (Patient's most recent lab result is older  than the maximum 21 days allowed.).   Wt Readings from Last 3 Encounters:  08/06/18 157 lb (71.2 kg)  05/22/18 156 lb 8 oz (71 kg)  05/14/18 155 lb 9.6 oz (70.6 kg)     Other studies reviewed: Additional studies/records reviewed today include: summarized above  ASSESSMENT AND PLAN:  1. Sinus tachycardia: Long-standing issue.  A symptomatic.  Continue verapamil 180 mg nightly.  Check BMP with recommendation to replete potassium to goal greater than 4.0.  She declines any changes to her medication at this time.  2. Hypertension: Increase lisinopril to 40 mg daily.  Continue verapamil as above.  Call if blood pressures remain elevated.  3. Nonocclusive CAD: By cardiac catheterization in 2009 as detailed above.  No symptoms concerning for angina.  Recommend risk factor modification and primary prevention.  Unfortunately some of this has been hindered by patient's refusal for medications as outlined below.  4. Nonischemic cardiomyopathy: With subsequent improvement in LV systolic function by echocardiogram in 07/2009.  Given patient's reported shortness of breath and "vascular congestion" associated with discontinuation of lisinopril with subsequent improvement with restarting lisinopril we will check an echocardiogram.  She does not appear grossly volume overloaded at this time.  5. Insulin-dependent diabetes: Followed by PCP.  Patient has previously refused evaluation at [facility]  6. Hyperlipidemia: Recent direct LDL of 192.  She continues to decline statin.  Lifestyle changes.  7. Hypokalemia: Check BMP.  Recommend repletion to goal greater than 4.0.  Disposition: F/u with Dr. Rockey Situ in at her previously recommended follow-up in 11/2018.  Current medicines are reviewed at length with the patient today.  The patient did not have any concerns regarding medicines.  Signed, Christell Faith, PA-C 08/06/2018 3:37 PM     [address] [place], [place]  [id number] [phone]

## 2018-08-06 ENCOUNTER — Encounter

## 2018-08-06 ENCOUNTER — Encounter: Payer: Self-pay | Admitting: Physician Assistant

## 2018-08-06 ENCOUNTER — Ambulatory Visit: Payer: Medicare HMO | Admitting: Physician Assistant

## 2018-08-06 VITALS — BP 148/88 | HR 103 | Ht 62.0 in | Wt 157.0 lb

## 2018-08-06 DIAGNOSIS — E782 Mixed hyperlipidemia: Secondary | ICD-10-CM | POA: Diagnosis not present

## 2018-08-06 DIAGNOSIS — E876 Hypokalemia: Secondary | ICD-10-CM | POA: Diagnosis not present

## 2018-08-06 DIAGNOSIS — E1159 Type 2 diabetes mellitus with other circulatory complications: Secondary | ICD-10-CM | POA: Diagnosis not present

## 2018-08-06 DIAGNOSIS — I2510 Atherosclerotic heart disease of native coronary artery without angina pectoris: Secondary | ICD-10-CM

## 2018-08-06 DIAGNOSIS — R0602 Shortness of breath: Secondary | ICD-10-CM | POA: Diagnosis not present

## 2018-08-06 DIAGNOSIS — I10 Essential (primary) hypertension: Secondary | ICD-10-CM | POA: Diagnosis not present

## 2018-08-06 DIAGNOSIS — R000 Tachycardia, unspecified: Secondary | ICD-10-CM | POA: Diagnosis not present

## 2018-08-06 DIAGNOSIS — I428 Other cardiomyopathies: Secondary | ICD-10-CM | POA: Diagnosis not present

## 2018-08-06 MED ORDER — LISINOPRIL 40 MG PO TABS: 40.0000 mg | ORAL_TABLET | Freq: Every day | ORAL | 3 refills | Status: DC

## 2018-08-06 NOTE — Patient Instructions (Signed)
Medication Instructions:  Your physician has recommended you make the following change in your medication:  1- INCREASE Lisinopril 40 mg by mouth once a day.  If you need a refill on your cardiac medications before your next appointment, please call your pharmacy.   Lab work: Your physician recommends that you return for lab work in: [place] - [facility].  If you have labs (blood work) drawn today and your tests are completely normal, you will receive your results only by: Marland Kitchen MyChart Message (if you have MyChart) OR . A paper copy in the mail If you have any lab test that is abnormal or we need to change your treatment, we will call you to review the results.  Testing/Procedures: Your physician has requested that you have an echocardiogram. Echocardiography is a painless test that uses sound waves to create images of your heart. It provides your doctor with information about the size and shape of your heart and how well your heart's chambers and valves are working. This procedure takes approximately one hour. There are no restrictions for this procedure. You may get an IV, if needed, to receive an ultrasound enhancing agent through to better visualize your heart.   Follow-Up: At [facility], you and your health needs are our priority.  As part of our continuing mission to provide you with exceptional heart care, we have created designated Provider Care Teams.  These Care Teams include your primary Cardiologist (physician) and Advanced Practice Providers (APPs -  Physician Assistants and Nurse Practitioners) who all work together to provide you with the care you need, when you need it. You will need a follow up appointment in 3 months.  Please call our office 2 months in advance to schedule this appointment.  You may see DR Ida Rogue or one of the following Advanced Practice Providers on your designated Care Team:   Murray Hodgkins, NP Christell Faith, PA-C . Marrianne Mood,  PA-C   Echocardiogram An echocardiogram, or echocardiography, uses sound waves (ultrasound) to produce an image of your heart. The echocardiogram is simple, painless, obtained within a short period of time, and offers valuable information to your health care provider. The images from an echocardiogram can provide information such as:  Evidence of coronary artery disease (CAD).  Heart size.  Heart muscle function.  Heart valve function.  Aneurysm detection.  Evidence of a past heart attack.  Fluid buildup around the heart.  Heart muscle thickening.  Assess heart valve function.  Tell a health care provider about:  Any allergies you have.  All medicines you are taking, including vitamins, herbs, eye drops, creams, and over-the-counter medicines.  Any problems you or family members have had with anesthetic medicines.  Any blood disorders you have.  Any surgeries you have had.  Any medical conditions you have.  Whether you are pregnant or may be pregnant. What happens before the procedure? No special preparation is needed. Eat and drink normally. What happens during the procedure?  In order to produce an image of your heart, gel will be applied to your chest and a wand-like tool (transducer) will be moved over your chest. The gel will help transmit the sound waves from the transducer. The sound waves will harmlessly bounce off your heart to allow the heart images to be captured in real-time motion. These images will then be recorded.  You may need an IV to receive a medicine that improves the quality of the pictures. What happens after the procedure? You may return to  your normal schedule including diet, activities, and medicines, unless your health care provider tells you otherwise. This information is not intended to replace advice given to you by your health care provider. Make sure you discuss any questions you have with your health care provider. Document Released:  09/15/2000 Document Revised: 05/06/2016 Document Reviewed: 05/26/2013 Elsevier Interactive Patient Education  2017 [employer].

## 2018-08-07 LAB — BASIC METABOLIC PANEL
BUN / CREAT RATIO: 16 (ref 12–28)
BUN: 13 mg/dL (ref 8–27)
CALCIUM: 9.8 mg/dL (ref 8.7–10.3)
CHLORIDE: 97 mmol/L (ref 96–106)
CO2: 20 mmol/L (ref 20–29)
Creatinine, Ser: 0.8 mg/dL (ref 0.57–1.00)
GFR, EST AFRICAN AMERICAN: 88 mL/min/{1.73_m2} (ref >59)
GFR, EST NON AFRICAN AMERICAN: 77 mL/min/{1.73_m2} (ref >59)
Glucose: 434 mg/dL — ABNORMAL HIGH (ref 65–99)
POTASSIUM: 3.9 mmol/L (ref 3.5–5.2)
SODIUM: 135 mmol/L (ref 134–144)

## 2018-08-15 ENCOUNTER — Ambulatory Visit (INDEPENDENT_AMBULATORY_CARE_PROVIDER_SITE_OTHER): Payer: Medicare HMO

## 2018-08-15 ENCOUNTER — Other Ambulatory Visit: Payer: Self-pay

## 2018-08-15 DIAGNOSIS — R0602 Shortness of breath: Secondary | ICD-10-CM | POA: Diagnosis not present

## 2018-08-19 ENCOUNTER — Encounter: Payer: Medicare HMO | Admitting: Family Medicine

## 2018-08-19 ENCOUNTER — Telehealth: Payer: Self-pay | Admitting: *Deleted

## 2018-08-19 NOTE — Telephone Encounter (Signed)
-----   Message from Rise Mu, PA-C sent at 08/16/2018  4:41 PM EST ----- Echo showed EF of 20 to 25%, hypokinesis of the anterior, and anteroseptal myocardium, mild mitral regurgitation, mildly dilated left atrium, RV systolic function was normal, pressure inside the vessels echo to the lungs was normal.  Pump function is now reduced at 20 to 25% with prior echocardiogram in 2010 demonstrating preserved LV systolic function with an EF of 50 to 55%.  She has wall motion abnormalities on the anterior wall.  I recommend the patient stop verapamil given her cardiomyopathy.  She has reported being intolerant to beta-blockers however we need to strongly consider re-challenging this given her cardiomyopathy.  We will need to escalate her evidence-based heart failure therapy medications.  We will also need to discuss right and left cardiac catheterization.  Please have her schedule an appointment with me or Dr. Rockey Situ within the next 1 week to address this.

## 2018-08-19 NOTE — Telephone Encounter (Signed)
No answer. Left message to call back.   

## 2018-08-20 NOTE — Telephone Encounter (Signed)
Pt is returning your call

## 2018-08-21 NOTE — Progress Notes (Signed)
Cardiology Office Note Date:  08/22/2018  Patient ID:  Kristy, [name] April 07, 1951, MRN [id number] PCP:  Owens Loffler, MD  Cardiologist:  Dr. Rockey Situ, MD    Chief Complaint: Follow up  History of Present Illness: Kristy [name] is a 67 y.o. female with history of nonobstructive CAD by [facility] in 2010, nonischemic cardiomyopathy with subsequent improvement in EF by echo in 07/2009 followed by recent reduction of EF by echo as below, left bundle branch block, sinus tachycardia, Crohn's disease, poorly controlled diabetes, hypertension, hyperlipidemia, and tobacco abuse who presents for follow-up of recent echo.   Cardiac catheterization on 01/07/2009 showed a right dominant system with the left main demonstrating no significant disease, LAD no significant disease, mild luminal irregularities and the LCx, mild luminal irregularities in the RCA, EF by LV gram estimated to be approximately 45% with hypokinesis of the inferior wall.  Patient was felt to have a mild nonischemic cardiomyopathy which may be related to the patient's known poorly controlled diabetes with small vessel disease.  Medical management was recommended.  Follow-up echocardiogram in 07/2009 demonstrated an EF of 50 to 55% with dyssynergy secondary to left bundle branch block.  She has had chronic sinus tachycardia and is intolerant to beta-blockers.  This has been managed with verapamil.  In follow-up with Dr. Rockey Situ in 11/2017 she continued to note heart rate consistently 100 bpm or greater.  She declined further medications.  She has had a difficult control in managing her diabetes.  She has previously declined statins.  She was admitted to the hospital in 05/2018 with diabetic ketoacidosis.  In this setting, she was treated for diverticulitis, DKA, and AKI by internal medicine. In follow up on 08/06/2018, she was doing well. She had self resumed lisinopril 20 mg daily secondary to elevated BP (previously taking 40 mg daily). She  noted significant improvement in her SOB. Her lisinopril was increased to 40 mg daily. Echo was performed on 08/15/2018, given her history of SOB, which showed an EF of 20-25%, hypokinesis of the anterior and anteroseptal myocardium, mild MR, mildly dilated LA, RVSF normal, PASP normal.   She comes in today to discuss this echo.  She comes in doing well this morning.  She notes a one-week history of left sided back pain following a recent wedding in which she was sitting in an uncomfortable chair.  On the evening of 11/20 going into 11/21 the patient developed left upper quadrant pain that radiated to the epigastric region with associated dyspnea.  There was no chest pain, palpitations, diaphoresis, nausea, vomiting, dizziness, presyncope, or syncope.  Pain lasted for 7 to 8 minutes and spontaneously resolved.  With regards to her breathing, she went to the freezer and stuck her head in with improvement in symptoms.  Currently feels well.  She is pleased that we discontinued verapamil in the setting of her cardiomyopathy as she states "I hated that medicine."  She is agreeable to rechallenge of low-dose beta-blocker in the setting of her cardiomyopathy.  She has previously reported intolerance to beta-blockers secondary to fatigue.  Blood pressure remains reasonably controlled at home.  She denies any lower extremity swelling, abdominal distention, orthopnea, or early satiety.  Blood sugars continue to be suboptimally controlled in the 200-400 range.   Past Medical History:  Diagnosis Date  . Allergic rhinitis   . Allergy   . Cardiomyopathy    mild nonischemic  . Cataract    mild   . Chronic systolic congestive heart failure ([facility])  01/11/2009   Qualifier: Diagnosis of  By: Silvio Pate MD, [facility]   . Crohn's disease ([facility]) 10/13/2013  . Diabetes mellitus   . Diverticulosis   . GERD (gastroesophageal reflux disease)   . Gout   . Hyperlipidemia   . Hypertension   . IBS (irritable bowel syndrome)   .  Left bundle branch block   . Neuromuscular disorder ([facility])    neuropathy  . Osteoarthritis   . Symptomatic cholelithiasis     Past Surgical History:  Procedure Laterality Date  . COLONOSCOPY    . DILATION AND CURETTAGE OF UTERUS    . SHOULDER SURGERY     15 + yrs ago   . SKIN SURGERY     nose   . VAGINAL HYSTERECTOMY      Current Meds  Medication Sig  . FLUoxetine (PROZAC) 20 MG capsule Alternate 2 tabs daily and then 1 tab the next day  . insulin regular human CONCENTRATED (HUMULIN R) 500 UNIT/ML injection Inject 0.24 mLs (120 Units total) into the skin 3 (three) times daily with meals.  . Insulin Syringe-Needle U-100 (B-D INS SYRINGE 0.5CC/31GX5/16) 31G X 5/16" 0.5 ML MISC USE AS DIRECTED THREE TIMES A DAY  . lisinopril (PRINIVIL,ZESTRIL) 40 MG tablet Take 1 tablet (40 mg total) by mouth daily.  . NONFORMULARY OR COMPOUNDED ITEM See pharmacy note (Patient taking differently: Apply 1 application topically at bedtime. See pharmacy note)  . ONE TOUCH ULTRA TEST test strip USE AS PER PACKAGE INSTRUCTIONS CHECK SUGARS UP TO-FOUR TIMES A DAY OR AS PRESCRIBED  . Probiotic Product (ALIGN) 4 MG CAPS Take 1 capsule by mouth daily.     Allergies:   Fish oil; Glimepiride; Guanfacine hcl; and Rosiglitazone   Social History:  The patient  reports that she quit smoking about 21 years ago. Her smoking use included cigarettes. She has a 22.50 pack-year smoking history. She has never used smokeless tobacco. She reports that she does not drink alcohol or use drugs.   Family History:  The patient's family history includes Hypertension in her father.  ROS:   Review of Systems  Constitutional: Positive for malaise/fatigue. Negative for chills, diaphoresis, fever and weight loss.  HENT: Negative for congestion.   Eyes: Negative for discharge and redness.  Respiratory: Negative for cough, hemoptysis, sputum production, shortness of breath and wheezing.   Cardiovascular: Negative for chest pain,  palpitations, orthopnea, claudication, leg swelling and PND.  Gastrointestinal: Positive for abdominal pain. Negative for blood in stool, heartburn, melena, nausea and vomiting.  Genitourinary: Negative for hematuria.  Musculoskeletal: Positive for back pain. Negative for falls and myalgias.  Skin: Negative for rash.  Neurological: Positive for weakness. Negative for dizziness, tingling, tremors, sensory change, speech change, focal weakness and loss of consciousness.  Endo/Heme/Allergies: Does not bruise/bleed easily.  Psychiatric/Behavioral: Negative for substance abuse. The patient is not nervous/anxious.   All other systems reviewed and are negative.    PHYSICAL EXAM:  VS:  BP (!) 142/88 (BP Location: Left Arm, Patient Position: Sitting, Cuff Size: Normal)   Pulse (!) 106   Ht 5' 3"  (1.6 m)   Wt 159 lb 8 oz (72.3 kg)   BMI 28.25 kg/m  BMI: Body mass index is 28.25 kg/m.  Physical Exam  Constitutional: She is oriented to person, place, and time. She appears well-developed and well-nourished.  HENT:  Head: Normocephalic and atraumatic.  Eyes: Right eye exhibits no discharge. Left eye exhibits no discharge.  Neck: Normal range of motion. No JVD present.  Cardiovascular: Regular rhythm, S1 normal, S2 normal and normal heart sounds. Tachycardia present. Exam reveals no distant heart sounds, no friction rub, no midsystolic click and no opening snap.  No murmur heard. Pulses:      Posterior tibial pulses are 2+ on the right side, and 2+ on the left side.  Pulmonary/Chest: Effort normal and breath sounds normal. No respiratory distress. She has no decreased breath sounds. She has no wheezes. She has no rales. She exhibits no tenderness.  Abdominal: Soft. She exhibits no distension. There is no tenderness.  Musculoskeletal: She exhibits no edema.  Neurological: She is alert and oriented to person, place, and time.  Skin: Skin is warm and dry. No cyanosis. Nails show no clubbing.    Psychiatric: She has a normal mood and affect. Her speech is normal and behavior is normal. Judgment and thought content normal.     EKG:  Was ordered and interpreted by me today. Shows sinus tachycardia, 106 bpm, LBBB (known)  Recent Labs: 07/11/2018: ALT 36; Hemoglobin 14.7; Platelets 204.0; TSH 3.88 08/06/2018: BUN 13; Creatinine, Ser 0.80; Potassium 3.9; Sodium 135  07/11/2018: Cholesterol 263; Direct LDL 192.0; HDL 36.90; Total CHOL/HDL Ratio 7; Triglycerides 244.0; VLDL 48.8   Estimated Creatinine Clearance: 65.1 mL/min (by C-G formula based on SCr of 0.8 mg/dL).   Wt Readings from Last 3 Encounters:  08/22/18 159 lb 8 oz (72.3 kg)  08/06/18 157 lb (71.2 kg)  05/22/18 156 lb 8 oz (71 kg)     Other studies reviewed: Additional studies/records reviewed today include: summarized above  ASSESSMENT AND PLAN:  1. Acute on chronic HFrEF: She does not appear volume overloaded at this time.  Patient with prior cardiomyopathy with EF of 45% with follow-up cardiac catheterization demonstrate nonobstructive coronary artery disease.  EF subsequently normalized.  Recent recheck echo in the setting of dyspnea and "vascular congestion" showed a newly reduced EF of 20 to 25% with anterior wall motion abnormalities.  She has a known history of left bundle branch block.  Schedule right and left cardiac catheterization.  At the time of the echo was resulted her verapamil was stopped given her cardiomyopathy.  We have started her on carvedilol today.  She will remain on lisinopril 40 mg daily.  Following her cardiac catheterization would recommend starting spironolactone.  CHF education provided.  2. Nonobstructive CAD: Prior cardiac catheterization in 2009 in the setting of acute systolic CHF demonstrate a nonocclusive disease as detailed above.  She remains without symptoms concerning for angina.  We will schedule a right and left cardiac catheterization as above given her newly worsened  cardiomyopathy.  Continue risk factor modification and primary prevention.  However, some of these methods have been limited given the patient's refusal of statins.  3. LUQ/epigastric pain: Currently asymptomatic.  Patient has long history of varying GI complaints including Crohn's, diverticulosis, IBS, and cholecystitis.  Recommend she follow-up with PCP.  4. Sinus tachycardia: Long-standing issue and asymptomatic.  Off of verapamil given her cardiomyopathy as above.  Start carvedilol 3.25 mg twice daily.  5. HTN: Blood pressure has been improving since increasing lisinopril back to her prior dose of 40 mg daily.  Blood pressure is mildly elevated today at 142/88.  This is likely in the setting of discontinuation of verapamil as above given her cardiomyopathy.  Start carvedilol 3.125 mg twice daily.  6. IDDM: Followed by PCP.  Patient has previously refused evaluation at [facility].  7. HLD: Direct LDL of 192 from 07/2018.  Continues to  decline statin.  If cardiac catheterization shows significant progression of coronary artery disease would strongly recommend she reconsider starting a statin.  Lifestyle changes recommended.  8. Hypokalemia: Improved by recent check on 08/06/2018.  Check BMP as part of precath labs.  Recommend repletion of potassium to goal 4.0.  Disposition: F/u with Dr. Rockey Situ or an APP in 2 weeks.   Current medicines are reviewed at length with the patient today.  The patient did not have any concerns regarding medicines.  Signed, Christell Faith, PA-C 08/22/2018 8:42 AM     [place] [address] [place] [address] [place], [place] [address] [phone]

## 2018-08-21 NOTE — Telephone Encounter (Signed)
Results called to pt. Pt verbalized understanding. She is agreeable to come to appointment with Thurmond Butts tomorrow morning.

## 2018-08-22 ENCOUNTER — Encounter: Payer: Self-pay | Admitting: Physician Assistant

## 2018-08-22 ENCOUNTER — Other Ambulatory Visit: Payer: Self-pay | Admitting: Physician Assistant

## 2018-08-22 ENCOUNTER — Ambulatory Visit: Payer: Medicare HMO | Admitting: Physician Assistant

## 2018-08-22 VITALS — BP 142/88 | HR 106 | Ht 63.0 in | Wt 159.5 lb

## 2018-08-22 DIAGNOSIS — I5022 Chronic systolic (congestive) heart failure: Secondary | ICD-10-CM | POA: Insufficient documentation

## 2018-08-22 DIAGNOSIS — E876 Hypokalemia: Secondary | ICD-10-CM | POA: Diagnosis not present

## 2018-08-22 DIAGNOSIS — E782 Mixed hyperlipidemia: Secondary | ICD-10-CM | POA: Diagnosis not present

## 2018-08-22 DIAGNOSIS — I5023 Acute on chronic systolic (congestive) heart failure: Secondary | ICD-10-CM | POA: Diagnosis not present

## 2018-08-22 DIAGNOSIS — I10 Essential (primary) hypertension: Secondary | ICD-10-CM | POA: Diagnosis not present

## 2018-08-22 DIAGNOSIS — R1013 Epigastric pain: Secondary | ICD-10-CM | POA: Diagnosis not present

## 2018-08-22 DIAGNOSIS — R000 Tachycardia, unspecified: Secondary | ICD-10-CM

## 2018-08-22 DIAGNOSIS — R1012 Left upper quadrant pain: Secondary | ICD-10-CM

## 2018-08-22 DIAGNOSIS — I2510 Atherosclerotic heart disease of native coronary artery without angina pectoris: Secondary | ICD-10-CM | POA: Diagnosis not present

## 2018-08-22 DIAGNOSIS — E1159 Type 2 diabetes mellitus with other circulatory complications: Secondary | ICD-10-CM | POA: Diagnosis not present

## 2018-08-22 DIAGNOSIS — Z01818 Encounter for other preprocedural examination: Secondary | ICD-10-CM | POA: Diagnosis not present

## 2018-08-22 MED ORDER — CARVEDILOL 6.25 MG PO TABS: 6.2500 mg | ORAL_TABLET | Freq: Two times a day (BID) | ORAL | 3 refills | Status: DC

## 2018-08-22 NOTE — Patient Instructions (Signed)
Medication Instructions:  Your physician recommends that you continue on your current medications as directed. Please refer to the Current Medication list given to you today.  Start Coreg 3.125 mg Two Times Daily   If you need a refill on your cardiac medications before your next appointment, please call your pharmacy.   Lab work: Your physician recommends that you return for lab work in: today   If you have labs (blood work) drawn today and your tests are completely normal, you will receive your results only by: Marland Kitchen MyChart Message (if you have MyChart) OR . A paper copy in the mail If you have any lab test that is abnormal or we need to change your treatment, we will call you to review the results.  Testing/Procedures: Your physician has requested that you have a cardiac catheterization. Cardiac catheterization is used to diagnose and/or treat various heart conditions. Doctors may recommend this procedure for a number of different reasons. The most common reason is to evaluate chest pain. Chest pain can be a symptom of coronary artery disease (CAD), and cardiac catheterization can show whether plaque is narrowing or blocking your heart's arteries. This procedure is also used to evaluate the valves, as well as measure the blood flow and oxygen levels in different parts of your heart. For further information please visit [email or web address]. Please follow instruction sheet, as given.    Follow-Up: At [facility], you and your health needs are our priority.  As part of our continuing mission to provide you with exceptional heart care, we have created designated Provider Care Teams.  These Care Teams include your primary Cardiologist (physician) and Advanced Practice Providers (APPs -  Physician Assistants and Nurse Practitioners) who all work together to provide you with the care you need, when you need it. You will need a follow up appointment the week after Thanksgiving .  Please call our  office 2 months in advance to schedule this appointment.  You may see No primary care provider on file. or one of the following Advanced Practice Providers on your designated Care Team:   Murray Hodgkins, NP Christell Faith, PA-C . Marrianne Mood, PA-C  Any Other Special Instructions Will Be Listed Below (If Applicable).     [address] [place], [place] [address] Dept: [phone] Loc: [phone]  Kristy [name]  08/22/2018  You are scheduled for a Cardiac Catheterization on Monday, November 25 with Dr. [name].  1. Please arrive at the [facility] first desk on the right  8:30 AM (This time is one hours before your procedure to ensure your preparation). Free valet parking service is available.   Special note: Every effort is made to have your procedure done on time. Please understand that emergencies sometimes delay scheduled procedures.  2. Diet: Do not eat solid foods after midnight.  The patient may have clear liquids until 5am upon the day of the procedure.  3. Labs: You will need to have blood drawn on Thursday, November 21 at [facility], Go to 1st desk on your right to register.  Address: [address], [place] [address]  Open: 8am - 5pm  Phone: [phone]. You do not need to be fasting.  4. Medication instructions in preparation for your procedure:   Contrast Allergy: No  Take 1/2 dose of insulin the night before and none the morning of your cath    On the morning  of your procedure, take your Aspirin and any morning medicines NOT listed above.  You may use sips of water.  5. Plan for one night stay--bring personal belongings. 6. Bring a current list of your medications and current insurance cards. 7. You MUST have a responsible person to drive you home. 8. Someone MUST be with you the first 24 hours after you arrive home  or your discharge will be delayed. 9. Please wear clothes that are easy to get on and off and wear slip-on shoes.  Thank you for allowing [place] to care for you!   -- [facility]

## 2018-08-22 NOTE — H&P (View-Only) (Signed)
Orders for right and left cardiac catheterization signed and held.  Case request signed.

## 2018-08-22 NOTE — Progress Notes (Signed)
Orders for right and left cardiac catheterization signed and held.  Case request signed.

## 2018-08-23 LAB — CBC
Hematocrit: 43.7 % (ref 34.0–46.6)
Hemoglobin: 14.1 g/dL (ref 11.1–15.9)
MCH: 29.1 pg (ref 26.6–33.0)
MCHC: 32.3 g/dL (ref 31.5–35.7)
MCV: 90 fL (ref 79–97)
PLATELETS: 183 10*3/uL (ref 150–450)
RBC: 4.84 x10E6/uL (ref 3.77–5.28)
RDW: 13 % (ref 12.3–15.4)
WBC: 8.7 10*3/uL (ref 3.4–10.8)

## 2018-08-23 LAB — BASIC METABOLIC PANEL
BUN/Creatinine Ratio: 17 (ref 12–28)
BUN: 12 mg/dL (ref 8–27)
CALCIUM: 9.4 mg/dL (ref 8.7–10.3)
CHLORIDE: 100 mmol/L (ref 96–106)
CO2: 24 mmol/L (ref 20–29)
Creatinine, Ser: 0.71 mg/dL (ref 0.57–1.00)
GFR calc non Af Amer: 88 mL/min/{1.73_m2} (ref >59)
GFR, EST AFRICAN AMERICAN: 102 mL/min/{1.73_m2} (ref >59)
GLUCOSE: 174 mg/dL — AB (ref 65–99)
POTASSIUM: 3.9 mmol/L (ref 3.5–5.2)
Sodium: 140 mmol/L (ref 134–144)

## 2018-08-26 ENCOUNTER — Other Ambulatory Visit: Payer: Self-pay

## 2018-08-26 ENCOUNTER — Ambulatory Visit
Admission: RE | Admit: 2018-08-26 | Discharge: 2018-08-26 | Disposition: A | Discharge status: Home / Self Care | Payer: Medicare HMO | Source: Ambulatory Visit | Attending: Cardiovascular Disease | Admitting: Cardiovascular Disease

## 2018-08-26 ENCOUNTER — Encounter
Admission: RE | Disposition: A | Discharge status: Home / Self Care | Payer: Self-pay | Source: Ambulatory Visit | Attending: Cardiovascular Disease

## 2018-08-26 DIAGNOSIS — E785 Hyperlipidemia, unspecified: Secondary | ICD-10-CM | POA: Diagnosis not present

## 2018-08-26 DIAGNOSIS — Z8249 Family history of ischemic heart disease and other diseases of the circulatory system: Secondary | ICD-10-CM | POA: Diagnosis not present

## 2018-08-26 DIAGNOSIS — I5023 Acute on chronic systolic (congestive) heart failure: Secondary | ICD-10-CM | POA: Diagnosis present

## 2018-08-26 DIAGNOSIS — E876 Hypokalemia: Secondary | ICD-10-CM | POA: Diagnosis not present

## 2018-08-26 DIAGNOSIS — I447 Left bundle-branch block, unspecified: Secondary | ICD-10-CM | POA: Diagnosis not present

## 2018-08-26 DIAGNOSIS — K5090 Crohn's disease, unspecified, without complications: Secondary | ICD-10-CM | POA: Diagnosis not present

## 2018-08-26 DIAGNOSIS — Z9889 Other specified postprocedural states: Secondary | ICD-10-CM | POA: Insufficient documentation

## 2018-08-26 DIAGNOSIS — Z87891 Personal history of nicotine dependence: Secondary | ICD-10-CM | POA: Insufficient documentation

## 2018-08-26 DIAGNOSIS — K219 Gastro-esophageal reflux disease without esophagitis: Secondary | ICD-10-CM | POA: Insufficient documentation

## 2018-08-26 DIAGNOSIS — I509 Heart failure, unspecified: Secondary | ICD-10-CM

## 2018-08-26 DIAGNOSIS — M1990 Unspecified osteoarthritis, unspecified site: Secondary | ICD-10-CM | POA: Diagnosis not present

## 2018-08-26 DIAGNOSIS — E1140 Type 2 diabetes mellitus with diabetic neuropathy, unspecified: Secondary | ICD-10-CM | POA: Insufficient documentation

## 2018-08-26 DIAGNOSIS — Z90710 Acquired absence of both cervix and uterus: Secondary | ICD-10-CM | POA: Insufficient documentation

## 2018-08-26 DIAGNOSIS — Z91013 Allergy to seafood: Secondary | ICD-10-CM | POA: Diagnosis not present

## 2018-08-26 DIAGNOSIS — I2510 Atherosclerotic heart disease of native coronary artery without angina pectoris: Secondary | ICD-10-CM | POA: Diagnosis not present

## 2018-08-26 DIAGNOSIS — M109 Gout, unspecified: Secondary | ICD-10-CM | POA: Insufficient documentation

## 2018-08-26 DIAGNOSIS — I110 Hypertensive heart disease with heart failure: Secondary | ICD-10-CM | POA: Insufficient documentation

## 2018-08-26 DIAGNOSIS — Z888 Allergy status to other drugs, medicaments and biological substances status: Secondary | ICD-10-CM | POA: Insufficient documentation

## 2018-08-26 DIAGNOSIS — I428 Other cardiomyopathies: Secondary | ICD-10-CM | POA: Diagnosis not present

## 2018-08-26 DIAGNOSIS — I5022 Chronic systolic (congestive) heart failure: Secondary | ICD-10-CM | POA: Insufficient documentation

## 2018-08-26 HISTORY — PX: RIGHT/LEFT HEART CATH AND CORONARY ANGIOGRAPHY: CATH118266

## 2018-08-26 LAB — GLUCOSE, CAPILLARY: GLUCOSE-CAPILLARY: 214 mg/dL — AB (ref 70–99)

## 2018-08-26 SURGERY — RIGHT/LEFT HEART CATH AND CORONARY ANGIOGRAPHY
Anesthesia: Moderate Sedation

## 2018-08-26 MED ORDER — FENTANYL CITRATE (PF) 100 MCG/2ML IJ SOLN
INTRAMUSCULAR | Status: AC
  Filled 2018-08-26: qty 2

## 2018-08-26 MED ORDER — SODIUM CHLORIDE 0.9 % IV SOLN: INTRAVENOUS | Status: DC

## 2018-08-26 MED ORDER — VERAPAMIL HCL 2.5 MG/ML IV SOLN
INTRAVENOUS | Status: AC
  Filled 2018-08-26: qty 2

## 2018-08-26 MED ORDER — ONDANSETRON HCL 4 MG/2ML IJ SOLN: 4.0000 mg | Freq: Four times a day (QID) | INTRAMUSCULAR | Status: DC | PRN

## 2018-08-26 MED ORDER — SODIUM CHLORIDE 0.9% FLUSH: 3.0000 mL | Freq: Two times a day (BID) | INTRAVENOUS | Status: DC

## 2018-08-26 MED ORDER — ACETAMINOPHEN 325 MG PO TABS: 650.0000 mg | ORAL_TABLET | ORAL | Status: DC | PRN

## 2018-08-26 MED ORDER — MIDAZOLAM HCL 2 MG/2ML IJ SOLN
INTRAMUSCULAR | Status: AC
  Filled 2018-08-26: qty 2

## 2018-08-26 MED ORDER — SODIUM CHLORIDE 0.9 % IV SOLN
INTRAVENOUS | Status: DC
  Administered 2018-08-26: 1000 mL via INTRAVENOUS

## 2018-08-26 MED ORDER — HEPARIN SODIUM (PORCINE) 1000 UNIT/ML IJ SOLN
INTRAMUSCULAR | Status: DC | PRN
  Administered 2018-08-26: 3500 [IU] via INTRAVENOUS

## 2018-08-26 MED ORDER — SODIUM CHLORIDE 0.9 % IV SOLN: 250.0000 mL | INTRAVENOUS | Status: DC | PRN

## 2018-08-26 MED ORDER — HEPARIN (PORCINE) IN NACL 1000-0.9 UT/500ML-% IV SOLN
INTRAVENOUS | Status: AC
  Filled 2018-08-26: qty 1000

## 2018-08-26 MED ORDER — VERAPAMIL HCL 2.5 MG/ML IV SOLN
INTRAVENOUS | Status: DC | PRN
  Administered 2018-08-26: 2.5 mg via INTRA_ARTERIAL

## 2018-08-26 MED ORDER — SODIUM CHLORIDE 0.9% FLUSH: 3.0000 mL | INTRAVENOUS | Status: DC | PRN

## 2018-08-26 MED ORDER — FENTANYL CITRATE (PF) 100 MCG/2ML IJ SOLN
INTRAMUSCULAR | Status: DC | PRN
  Administered 2018-08-26: 25 ug via INTRAVENOUS

## 2018-08-26 MED ORDER — HEPARIN SODIUM (PORCINE) 1000 UNIT/ML IJ SOLN
INTRAMUSCULAR | Status: AC
  Filled 2018-08-26: qty 1

## 2018-08-26 MED ORDER — MIDAZOLAM HCL 2 MG/2ML IJ SOLN
INTRAMUSCULAR | Status: DC | PRN
  Administered 2018-08-26: 1 mg via INTRAVENOUS

## 2018-08-26 MED ORDER — ASPIRIN 81 MG PO CHEW: 81.0000 mg | CHEWABLE_TABLET | ORAL | Status: DC

## 2018-08-26 MED ORDER — IOPAMIDOL (ISOVUE-300) INJECTION 61%
INTRAVENOUS | Status: DC | PRN
  Administered 2018-08-26: 50 mL via INTRA_ARTERIAL

## 2018-08-26 SURGICAL SUPPLY — 12 items
CATH BALLN WEDGE 5F 110CM (CATHETERS) ×2 IMPLANT
CATH INFINITI 5 FR JL3.5 (CATHETERS) ×2 IMPLANT
CATH INFINITI 5FR JK (CATHETERS) ×2 IMPLANT
CATH INFINITI JR4 5F (CATHETERS) ×2 IMPLANT
DEVICE RAD TR BAND REGULAR (VASCULAR PRODUCTS) ×2 IMPLANT
GLIDESHEATH SLEND SS 6F .021 (SHEATH) ×2 IMPLANT
GUIDEWIRE .025 260CM (WIRE) ×2 IMPLANT
KIT MANI 3VAL PERCEP (MISCELLANEOUS) ×2 IMPLANT
KIT RIGHT HEART (MISCELLANEOUS) ×2 IMPLANT
PACK CARDIAC CATH (CUSTOM PROCEDURE TRAY) ×2 IMPLANT
SHEATH GLIDE SLENDER 4/5FR (SHEATH) ×2 IMPLANT
WIRE ROSEN-J .035X260CM (WIRE) ×2 IMPLANT

## 2018-08-26 NOTE — Interval H&P Note (Signed)
History and Physical Interval Note:  08/26/2018 10:49 AM  Kristy [name]  has presented today for surgery, with the diagnosis of LT RT Heart Cath   Acute Heart Failure  The various methods of treatment have been discussed with the patient and family. After consideration of risks, benefits and other options for treatment, the patient has consented to  Procedure(s): RIGHT/LEFT HEART CATH AND CORONARY ANGIOGRAPHY (N/A) as a surgical intervention .  The patient's history has been reviewed, patient examined, no change in status, stable for surgery.  I have reviewed the patient's chart and labs.  Questions were answered to the patient's satisfaction.     [name]

## 2018-09-06 ENCOUNTER — Ambulatory Visit: Payer: Medicare HMO | Admitting: Family Medicine

## 2018-09-06 DIAGNOSIS — R69 Illness, unspecified: Secondary | ICD-10-CM | POA: Diagnosis not present

## 2018-09-13 ENCOUNTER — Encounter: Payer: Self-pay | Admitting: Nurse Practitioner

## 2018-09-13 ENCOUNTER — Ambulatory Visit: Payer: Medicare HMO | Admitting: Nurse Practitioner

## 2018-09-13 VITALS — BP 144/80 | HR 92 | Ht 63.0 in | Wt 160.5 lb

## 2018-09-13 DIAGNOSIS — I428 Other cardiomyopathies: Secondary | ICD-10-CM

## 2018-09-13 DIAGNOSIS — I10 Essential (primary) hypertension: Secondary | ICD-10-CM | POA: Diagnosis not present

## 2018-09-13 DIAGNOSIS — I2510 Atherosclerotic heart disease of native coronary artery without angina pectoris: Secondary | ICD-10-CM | POA: Diagnosis not present

## 2018-09-13 DIAGNOSIS — I5022 Chronic systolic (congestive) heart failure: Secondary | ICD-10-CM

## 2018-09-13 DIAGNOSIS — E782 Mixed hyperlipidemia: Secondary | ICD-10-CM

## 2018-09-13 MED ORDER — SACUBITRIL-VALSARTAN 49-51 MG PO TABS: ORAL_TABLET | ORAL | 1 refills | Status: DC

## 2018-09-13 NOTE — Patient Instructions (Signed)
Medication Instructions:  Your physician has recommended you make the following change in your medication:  1- stop lisinopril today 2- Start Entresto 1 tablet (49/51 mg) twice daily starting on Sunday 09/15/18.  If you need a refill on your cardiac medications before your next appointment, please call your pharmacy.   Lab work: Your physician recommends that you return for lab work in: 1 week at the [facility])  If you have labs (blood work) drawn today and your tests are completely normal, you will receive your results only by: Marland Kitchen MyChart Message (if you have MyChart) OR . A paper copy in the mail If you have any lab test that is abnormal or we need to change your treatment, we will call you to review the results.  Testing/Procedures: None ordered   Follow-Up: At [facility], you and your health needs are our priority.  As part of our continuing mission to provide you with exceptional heart care, we have created designated Provider Care Teams.  These Care Teams include your primary Cardiologist (physician) and Advanced Practice Providers (APPs -  Physician Assistants and Nurse Practitioners) who all work together to provide you with the care you need, when you need it. You will need a follow up appointment in 2 weeks with Murray Hodgkins, NP.

## 2018-09-13 NOTE — Progress Notes (Signed)
Office Visit    Patient Name: Kristy [name] Date of Encounter: 09/13/2018  Primary Care Provider:  Owens Loffler, MD Primary Cardiologist:  Ida Rogue, MD  Chief Complaint    67 year old female with a history of nonischemic cardiomyopathy, HFrEF, nonobstructive CAD, hypertension, hyperlipidemia, left bundle branch block, GERD, gout, and Crohn's disease, who presents for follow-up after recent diagnostic catheterization.  Past Medical History    Past Medical History:  Diagnosis Date  . Allergic rhinitis   . Allergy   . Cataract    mild   . Crohn's disease ([place]) 10/13/2013  . Diverticulosis   . GERD (gastroesophageal reflux disease)   . Gout   . HFrEF (heart failure with reduced ejection fraction) ([place])    a. 12/2008 Cath: EF 45% w/ inf HK; b. 07/2009 Echo: EF 50-55%; c. 08/2018 Echo: EF 20-25%.  Marland Kitchen Hyperlipidemia   . Hypertension   . IBS (irritable bowel syndrome)   . Left bundle branch block   . Neuromuscular disorder (HCC)    neuropathy  . NICM (nonischemic cardiomyopathy) ([place])    a. 12/2008 Cath: no significant dzs, EF 45% w/ inf HK->Med Rx; b. 07/2009 Echo: EF 50-55%; c. 08/2018 Echo: EF 20-25%, ant/antsept HK, mild MR, mildly dil LA, nl RV fx; d. 08/2018 Cath: D1 80, otw nonobs dzs->Med rx.  . Non-obstructive CAD (coronary artery disease)    a. 12/2008 Cath: no significant dzs, EF 45% w/ inf HK->Med Rx; b. 08/2018 Cath: LM nl, LAD min irregs, D1 80, LCX 46md, RCA min irregs->Med Rx.  . Osteoarthritis   . Poorly controlled Diabetes mellitus    a. 07/2018 A1c 13.1.  .Marland KitchenSymptomatic cholelithiasis    Past Surgical History:  Procedure Laterality Date  . COLONOSCOPY    . DILATION AND CURETTAGE OF UTERUS    . RIGHT/LEFT HEART CATH AND CORONARY ANGIOGRAPHY N/A 08/26/2018   Procedure: RIGHT/LEFT HEART CATH AND CORONARY ANGIOGRAPHY;  Surgeon: AWellington Hampshire MD;  Location: [facility];  Service: Cardiovascular;  Laterality: N/A;  . SHOULDER SURGERY       15 + yrs ago   . SKIN SURGERY     nose   . VAGINAL HYSTERECTOMY      Allergies  Allergies  Allergen Reactions  . Fish Oil Other (See Comments)    Gout  . Glimepiride     REACTION: hypoglycemia  . Guanfacine Hcl     REACTION: unspecified  . Rosiglitazone Other (See Comments)    CHF    History of Present Illness    67year old female with the above complex past medical history including nonischemic cardiomyopathy and HFrEF, initially diagnosed in 2010 with an EF of 45% at that time.  Catheterization showed minimal, nonobstructive CAD and she was medically managed.  Other history includes hypertension, hyperlipidemia, left bundle branch block, sinus tachycardia, GERD, gout, and Crohn's disease.  She had recovery of LV function in 2010 when echocardiogram showed an EF of 50 to 55%.  She is struggled with control of diabetes over the years and was admitted for DKA and acute kidney injury in August 2019.  Following hospitalization, she was placed back on evidence-based medical therapy for heart failure and cardiomyopathy however, in the setting of dyspnea, she underwent echocardiogram in mid November showing recurrent LV dysfunction with an EF of 20 to 25%.  After discussion, she then underwent diagnostic catheterization which showed predominantly nonobstructive CAD with a more significant stenosis in a small diagonal branch.  Medical therapy was  again recommended and beta-blocker dose was titrated.  Since her catheterization, she has done well.  She has not been experiencing any significant dyspnea on exertion though sometimes notes at higher levels of activity.  She denies chest pain, palpitations, PND, orthopnea, dizziness, syncope, edema, or early satiety.  She reports compliance with medications, is weighing herself daily, and is careful with salt intake.  Home Medications    Prior to Admission medications   Medication Sig Start Date End Date Taking? Authorizing Provider  acetaminophen  (TYLENOL) 325 MG tablet Take 325 mg by mouth daily as needed for moderate pain or headache.   Yes [provider]  carvedilol (COREG) 6.25 MG tablet Take 1 tablet (6.25 mg total) by mouth 2 (two) times daily. 08/22/18  Yes Dunn, Areta Haber, PA-C  chlorpheniramine (CHLOR-TRIMETON) 4 MG tablet Take 4 mg by mouth daily as needed for allergies.   Yes [provider]  FLUoxetine (PROZAC) 20 MG capsule Alternate 2 tabs daily and then 1 tab the next day Patient taking differently: Take 20 mg by mouth daily.  01/16/18  Yes Copland, [name], MD  insulin regular human CONCENTRATED (HUMULIN R) 500 UNIT/ML injection Inject 0.24 mLs (120 Units total) into the skin 3 (three) times daily with meals. Patient taking differently: Inject 50-100 Units into the skin 4 (four) times daily.  05/15/18  Yes Copland, [name], MD  Insulin Syringe-Needle U-100 (B-D INS SYRINGE 0.5CC/31GX5/16) 31G X 5/16" 0.5 ML MISC USE AS DIRECTED THREE TIMES A DAY 08/06/17  Yes Copland, [name], MD  lisinopril (PRINIVIL,ZESTRIL) 40 MG tablet Take 1 tablet (40 mg total) by mouth daily. Patient taking differently: Take 20 mg by mouth 2 (two) times daily.  08/06/18 11/04/18 Yes Dunn, Areta Haber, PA-C  metroNIDAZOLE (METROGEL) 0.75 % gel Apply 1 application topically daily as needed (acne).   Yes [provider]  NONFORMULARY OR COMPOUNDED ITEM See pharmacy note Patient taking differently: Apply 1 application topically at bedtime as needed (neuropathy). Peripheral Neuropathy Cream-  Bupivacaine 1%, Doxepin 3%, Gabapentin 6%, Pentoxifylline 3%, Topiramate 1%  Apply 1-2 grams to affected area 3-4 times daily  Qty. 120 gm 07/20/17  Yes Evans, Ruby Cola M, DPM  ONE TOUCH ULTRA TEST test strip USE AS PER PACKAGE INSTRUCTIONS CHECK SUGARS UP TO-FOUR TIMES A DAY OR AS PRESCRIBED 05/21/17  Yes Copland, [name], MD  Probiotic Product (ALIGN) 4 MG CAPS Take 4 mg by mouth daily.    Yes [provider]    Review of Systems    Some  dyspnea on exertion but overall stable.  She denies chest pain, palpitations, PND, orthopnea, dizziness, syncope, edema, or early satiety.  All other systems reviewed and are otherwise negative except as noted above.  Physical Exam    VS:  BP (!) 144/80 (BP Location: Left Arm, Patient Position: Sitting, Cuff Size: Normal)   Pulse 92   Ht 5' 3"  (1.6 m)   Wt 160 lb 8 oz (72.8 kg)   BMI 28.43 kg/m  , BMI Body mass index is 28.43 kg/m. GEN: Well nourished, well developed, in no acute distress. HEENT: normal. Neck: Supple, no JVD, carotid bruits, or masses. Cardiac: RRR, no murmurs, rubs, or gallops. No clubbing, cyanosis, edema.  Radials/DP/PT 2+ and equal bilaterally.  Right wrist and right brachial catheterization sites are without bleeding, bruit, or hematoma. Respiratory:  Respirations regular and unlabored, clear to auscultation bilaterally. GI: Soft, nontender, nondistended, BS + x 4. MS: no deformity or atrophy. Skin: warm and dry, no rash. Neuro:  Strength and sensation are intact. Psych: Normal affect.  Accessory Clinical Findings    ECG personally reviewed by me today -regular sinus rhythm, 92, left bundle branch block- no acute changes.  Assessment & Plan    1.  HFrEF/nonischemic cardiomyopathy: Recent echo showed reduction in LV function to 20-25%.  Catheterization shows predominantly nonobstructive CAD with an 80% stenosis in a diagonal.  LV dysfunction remains out of proportion to degree of coronary disease and thus this is nonischemic.  She has been compliant with beta-blocker and ACE inhibitor therapy and her weight has been stable at home.  She does not have any significant symptoms today and is euvolemic on exam.  I have asked her to stop lisinopril and we will plan to start Entresto 49/51 mg twice daily in 36 hours.  Continue current dose of carvedilol.  I will follow-up a basic metabolic panel in 1 week.  I will plan to see her back in clinic in 2 weeks, at which point I  will look to add Spironolactone provided that renal function stable.  Ultimately, she will require additional titration of medications with plan for follow-up echocardiogram in approximately 3 months to reevaluate LV function and determine potential need for defibrillator/resynchronization therapy.  2.  Nonobstructive CAD: Status post recent catheterization.  She has not been having chest pain.  Continue beta-blocker.  She has previously refused statins.  3.  Essential hypertension: Switching from lisinopril to Entresto.  Continue beta-blocker.  4.  History of sinus tachycardia: Rate 92 on beta-blocker therapy today.  5.  Hyperlipidemia: LDL was 192 in October.  In the setting of mild nonobstructive CAD, she should ideally be placed on statin therapy however she has previously expressed a desire to avoid.  6.  DMII: poorly controlled despite high dose insulin.  Per PCP.  7.  Disposition: Follow-up basic metabolic panel in 1 week.  Follow-up in clinic in 2 weeks.   Murray Hodgkins, NP 09/13/2018, 1:13 PM

## 2018-09-20 ENCOUNTER — Other Ambulatory Visit
Admission: RE | Admit: 2018-09-20 | Discharge: 2018-09-20 | Disposition: A | Discharge status: Home / Self Care | Payer: Medicare HMO | Source: Ambulatory Visit | Attending: Nurse Practitioner | Admitting: Nurse Practitioner

## 2018-09-20 DIAGNOSIS — I2510 Atherosclerotic heart disease of native coronary artery without angina pectoris: Secondary | ICD-10-CM | POA: Diagnosis not present

## 2018-09-20 DIAGNOSIS — I10 Essential (primary) hypertension: Secondary | ICD-10-CM | POA: Diagnosis not present

## 2018-09-20 DIAGNOSIS — I5022 Chronic systolic (congestive) heart failure: Secondary | ICD-10-CM | POA: Insufficient documentation

## 2018-09-20 LAB — BASIC METABOLIC PANEL
Anion gap: 11 (ref 5–15)
BUN: 14 mg/dL (ref 8–23)
CHLORIDE: 101 mmol/L (ref 98–111)
CO2: 27 mmol/L (ref 22–32)
Calcium: 9.5 mg/dL (ref 8.9–10.3)
Creatinine, Ser: 0.69 mg/dL (ref 0.44–1.00)
GFR calc non Af Amer: >60 mL/min (ref >60)
Glucose, Bld: 172 mg/dL — ABNORMAL HIGH (ref 70–99)
POTASSIUM: 3.2 mmol/L — AB (ref 3.5–5.1)
SODIUM: 139 mmol/L (ref 135–145)

## 2018-09-23 ENCOUNTER — Other Ambulatory Visit: Payer: Self-pay | Admitting: *Deleted

## 2018-09-23 ENCOUNTER — Telehealth: Payer: Self-pay | Admitting: *Deleted

## 2018-09-23 DIAGNOSIS — I5022 Chronic systolic (congestive) heart failure: Secondary | ICD-10-CM

## 2018-09-23 DIAGNOSIS — I10 Essential (primary) hypertension: Secondary | ICD-10-CM

## 2018-09-23 MED ORDER — SPIRONOLACTONE 25 MG PO TABS: 12.5000 mg | ORAL_TABLET | Freq: Every day | ORAL | 3 refills | Status: DC

## 2018-09-23 NOTE — Telephone Encounter (Signed)
No answer. Left message to call back.   

## 2018-09-23 NOTE — Telephone Encounter (Signed)
-----   Message from Theora Gianotti, NP sent at 09/20/2018  5:45 PM EST ----- Renal fxn stable. K low.  Please add spironolactone 12.78m daily. F/u bmet next week.

## 2018-10-07 DIAGNOSIS — E113292 Type 2 diabetes mellitus with mild nonproliferative diabetic retinopathy without macular edema, left eye: Secondary | ICD-10-CM | POA: Diagnosis not present

## 2018-10-07 DIAGNOSIS — H2513 Age-related nuclear cataract, bilateral: Secondary | ICD-10-CM | POA: Diagnosis not present

## 2018-10-07 DIAGNOSIS — B029 Zoster without complications: Secondary | ICD-10-CM | POA: Diagnosis not present

## 2018-10-07 LAB — HM DIABETES EYE EXAM

## 2018-10-16 ENCOUNTER — Encounter: Payer: Self-pay | Admitting: Nurse Practitioner

## 2018-10-16 ENCOUNTER — Ambulatory Visit: Payer: Medicare HMO | Admitting: Nurse Practitioner

## 2018-10-16 VITALS — BP 130/62 | HR 105 | Ht 63.0 in | Wt 157.0 lb

## 2018-10-16 DIAGNOSIS — R000 Tachycardia, unspecified: Secondary | ICD-10-CM | POA: Diagnosis not present

## 2018-10-16 DIAGNOSIS — I428 Other cardiomyopathies: Secondary | ICD-10-CM | POA: Diagnosis not present

## 2018-10-16 DIAGNOSIS — I10 Essential (primary) hypertension: Secondary | ICD-10-CM

## 2018-10-16 DIAGNOSIS — E119 Type 2 diabetes mellitus without complications: Secondary | ICD-10-CM

## 2018-10-16 DIAGNOSIS — I5022 Chronic systolic (congestive) heart failure: Secondary | ICD-10-CM | POA: Diagnosis not present

## 2018-10-16 DIAGNOSIS — Z794 Long term (current) use of insulin: Secondary | ICD-10-CM

## 2018-10-16 DIAGNOSIS — E782 Mixed hyperlipidemia: Secondary | ICD-10-CM | POA: Diagnosis not present

## 2018-10-16 MED ORDER — IVABRADINE HCL 5 MG PO TABS: 5.0000 mg | ORAL_TABLET | Freq: Two times a day (BID) | ORAL | 2 refills | Status: DC

## 2018-10-16 NOTE — Patient Instructions (Signed)
Medication Instructions:  START Corlanor 5 mg twice daily  If you need a refill on your cardiac medications before your next appointment, please call your pharmacy.   Lab work: Your provider would like for you to have the following labs today: BMET  If you have labs (blood work) drawn today and your tests are completely normal, you will receive your results only by: Marland Kitchen MyChart Message (if you have MyChart) OR . A paper copy in the mail If you have any lab test that is abnormal or we need to change your treatment, we will call you to review the results.  Testing/Procedures: None ordered  Follow-Up: At [facility], you and your health needs are our priority.  As part of our continuing mission to provide you with exceptional heart care, we have created designated Provider Care Teams.  These Care Teams include your primary Cardiologist (physician) and Advanced Practice Providers (APPs -  Physician Assistants and Nurse Practitioners) who all work together to provide you with the care you need, when you need it. You will need a follow up appointment in 2-4 weeks with Murray Hodgkins, NP

## 2018-10-16 NOTE — Progress Notes (Signed)
Office Visit    Patient Name: Kristy [name] Date of Encounter: 10/16/2018  Primary Care Provider:  Owens Loffler, MD Primary Cardiologist:  Ida Rogue, MD  Chief Complaint    68 year old female with a history of nonischemic cardiomyopathy, HFrEF, nonobstructive CAD, hypertension, hyperlipidemia, left bundle branch block, GERD, gout, and Crohn's disease, who presents for follow-up for heart failure.  Past Medical History    Past Medical History:  Diagnosis Date  . Allergic rhinitis   . Allergy   . Cataract    mild   . Crohn's disease ([place]) 10/13/2013  . Diverticulosis   . GERD (gastroesophageal reflux disease)   . Gout   . HFrEF (heart failure with reduced ejection fraction) ([place])    a. 12/2008 Cath: EF 45% w/ inf HK; b. 07/2009 Echo: EF 50-55%; c. 08/2018 Echo: EF 20-25%.  Marland Kitchen Hyperlipidemia   . Hypertension   . IBS (irritable bowel syndrome)   . Left bundle branch block   . Neuromuscular disorder (HCC)    neuropathy  . NICM (nonischemic cardiomyopathy) ([place])    a. 12/2008 Cath: no significant dzs, EF 45% w/ inf HK->Med Rx; b. 07/2009 Echo: EF 50-55%; c. 08/2018 Echo: EF 20-25%, ant/antsept HK, mild MR, mildly dil LA, nl RV fx; d. 08/2018 Cath: D1 80, otw nonobs dzs->Med rx.  . Non-obstructive CAD (coronary artery disease)    a. 12/2008 Cath: no significant dzs, EF 45% w/ inf HK->Med Rx; b. 08/2018 Cath: LM nl, LAD min irregs, D1 80, LCX 53md, RCA min irregs->Med Rx.  . Osteoarthritis   . Poorly controlled Diabetes mellitus    a. 07/2018 A1c 13.1.  .Marland KitchenSymptomatic cholelithiasis    Past Surgical History:  Procedure Laterality Date  . COLONOSCOPY    . DILATION AND CURETTAGE OF UTERUS    . RIGHT/LEFT HEART CATH AND CORONARY ANGIOGRAPHY N/A 08/26/2018   Procedure: RIGHT/LEFT HEART CATH AND CORONARY ANGIOGRAPHY;  Surgeon: AWellington Hampshire MD;  [facility];  Service: Cardiovascular;  Laterality: N/A;  . SHOULDER SURGERY     15 + yrs ago   .  SKIN SURGERY     nose   . VAGINAL HYSTERECTOMY      Allergies  Allergies  Allergen Reactions  . Fish Oil Other (See Comments)    Gout  . Glimepiride     REACTION: hypoglycemia  . Guanfacine Hcl     REACTION: unspecified  . Rosiglitazone Other (See Comments)    CHF    History of Present Illness    68year old female with the above complex past medical history including nonischemic cardiomyopathy and HFrEF, initially diagnosed in 2010 with an EF of 45% at that time.  Catheterization showed minimal, nonobstructive CAD and she was medically managed.  Other history includes hypertension, hyperlipidemia, left bundle branch block, sinus tachycardia, GERD, gout, and Crohn's disease.  She had recovery of LV function in 2010 when echo showed an EF of 50 to 55%.  She has struggled with control of her diabetes over the years and was admitted with DKA and acute kidney injury in August 2019.  Following hospitalization, she was placed on evidence-based medical therapy for heart failure and cardiomyopathy however, in the setting of dyspnea, she underwent echocardiogram in mid November 2019, showing recurrent LV dysfunction with an EF of 20 to 25%.  She subsequently underwent diagnostic catheterization which showed predominantly nonobstructive CAD with a more significant stenosis in a small diagonal branch.  Medical therapy was again reinstituted with  beta-blocker and subsequently Entresto and spironolactone were added following my most recent clinic visit with her on December 13.  She says that after starting Entresto, she initially had some orthostasis but this is subsequently improved.  She has not been having any dyspnea, palpitations, PND, orthopnea, syncope, edema, chest pain, or early satiety.  She says she is accustomed to having very high blood sugars-often as high as 400 in the mornings.  Her last A1c was 13.1 in October 2019, down from 15.3 in April 2019.  She is also accustomed to having elevations  in heart rates, often into the low 100s.  Her heart rate is 105 today.  Home Medications    Prior to Admission medications   Medication Sig Start Date End Date Taking? Authorizing Provider  acetaminophen (TYLENOL) 325 MG tablet Take 325 mg by mouth daily as needed for moderate pain or headache.    [provider]  carvedilol (COREG) 6.25 MG tablet Take 1 tablet (6.25 mg total) by mouth 2 (two) times daily. 08/22/18   Dunn, Areta Haber, PA-C  chlorpheniramine (CHLOR-TRIMETON) 4 MG tablet Take 4 mg by mouth daily as needed for allergies.    [provider]  FLUoxetine (PROZAC) 20 MG capsule Alternate 2 tabs daily and then 1 tab the next day Patient taking differently: Take 20 mg by mouth daily.  01/16/18   Copland, [name], MD  insulin regular human CONCENTRATED (HUMULIN R) 500 UNIT/ML injection Inject 0.24 mLs (120 Units total) into the skin 3 (three) times daily with meals. Patient taking differently: Inject 50-100 Units into the skin 4 (four) times daily.  05/15/18   Copland, [name], MD  Insulin Syringe-Needle U-100 (B-D INS SYRINGE 0.5CC/31GX5/16) 31G X 5/16" 0.5 ML MISC USE AS DIRECTED THREE TIMES A DAY 08/06/17   Copland, [name], MD  metroNIDAZOLE (METROGEL) 0.75 % gel Apply 1 application topically daily as needed (acne).    [provider]  NONFORMULARY OR COMPOUNDED ITEM See pharmacy note Patient taking differently: Apply 1 application topically at bedtime as needed (neuropathy). Peripheral Neuropathy Cream-  Bupivacaine 1%, Doxepin 3%, Gabapentin 6%, Pentoxifylline 3%, Topiramate 1%  Apply 1-2 grams to affected area 3-4 times daily  Qty. 120 gm 07/20/17   Evans, Dorathy Daft, DPM  ONE TOUCH ULTRA TEST test strip USE AS PER PACKAGE INSTRUCTIONS CHECK SUGARS UP TO-FOUR TIMES A DAY OR AS PRESCRIBED 05/21/17   Copland, [name], MD  Probiotic Product (ALIGN) 4 MG CAPS Take 4 mg by mouth daily.     [provider]  sacubitril-valsartan Delene Loll) 49-51 MG Take 1 tablet  (49/51 mg) by mouth 2 times daily starting on Sunday 09/15/18 09/13/18   Theora Gianotti, NP  spironolactone (ALDACTONE) 25 MG tablet Take 0.5 tablets (12.5 mg total) by mouth daily. 09/23/18   Theora Gianotti, NP    Review of Systems    Initially had orthostatic-like symptoms after initiating Entresto but now feels better.  She denies chest pain, palpitations, PND, orthopnea, dizziness, syncope, edema, dyspnea, or early satiety.  She reports compliance with her medications.  All other systems reviewed and are otherwise negative except as noted above.  Physical Exam    VS:  BP 130/62 (BP Location: Left Arm, Patient Position: Sitting, Cuff Size: Normal)   Pulse (!) 105   Ht 5' 3"  (1.6 m)   Wt 157 lb (71.2 kg)   BMI 27.81 kg/m  , BMI Body mass index is 27.81 kg/m. GEN: Well nourished, well developed, in no acute distress. HEENT:  normal. Neck: Supple, no JVD, carotid bruits, or masses. Cardiac: RRR, tachycardic, no murmurs, rubs, or gallops. No clubbing, cyanosis, edema.  Radials/PT 2+ and equal bilaterally.  Respiratory:  Respirations regular and unlabored, clear to auscultation bilaterally. GI: Soft, nontender, nondistended, BS + x 4. MS: no deformity or atrophy. Skin: warm and dry, no rash. Neuro:  Strength and sensation are intact. Psych: Normal affect.  Accessory Clinical Findings    ECG personally reviewed by me today -sinus tachycardia, 105, left bundle branch block- no acute changes.  Lab Results  Component Value Date   CREATININE 0.69 09/20/2018   BUN 14 09/20/2018   NA 139 09/20/2018   K 3.2 (L) 09/20/2018   CL 101 09/20/2018   CO2 27 09/20/2018     Assessment & Plan    1.  HFrEF/nonischemic cardiomyopathy: Recent echocardiogram showed reduction in LV function to 20 to 25%.  Follow-up catheterization showed predominantly nonobstructive CAD with an 80% stenosis in a diagonal branch.  LV function remains out of portion to degree of coronary  disease.  Following her last visit, I added Entresto and after lab work returned stable, I also added low-dose Spironolactone.  She was supposed to have had a basic metabolic panel about a week after initiation of spironolactone and has not.  We will obtain today.  She did have some orthostatic symptoms initially after starting Entresto and though these have resolved.  Blood pressure is stable today at 130/62.  Heart rate is elevated at 105.  I had initially considered titrating carvedilol further but given orthostatic symptoms previously, I will not do this.  Instead, I am adding Corlanor 5 mg twice daily.  I will plan to see her back in approximately 2 to 4 weeks to evaluate for additional titration of medications.  Ultimately, I will plan on a follow-up echocardiogram in about 2 months to reevaluate LV function on maximal medical therapy.  If LV function remains depressed, she will require EP evaluation to determine potential need for defibrillator/resynchronization therapy.  At that point, she would also benefit from evaluation for advanced heart failure therapies.  2.  Nonobstructive coronary artery disease: Status post catheterization in November.  No chest pain.  Continue beta-blocker.  She has previously refused statin therapy.  3.  Essential hypertension: Stable on beta-blocker, Entresto, and spironolactone.  Likely has room for further titration of either Entresto or spironolactone though she did have orthostatic symptoms following initiation of Entresto.  These have since resolved.  4.  Hyperlipidemia: LDL was 192 in October.  In the setting of mild nonobstructive CAD, she should ideally be on statin therapy however she has previous expressed a desire to avoid due to diffuse body aches when on statin in the past.  She would be willing to try Zetia.  As I am already adding another med today (corlanor), I will hold off on adding Zetia at this time in case she develops side effects.  Can reconsider at  her next visit.  5.  Sinus tachycardia: In the setting of reduced LV function, I am adding corlanor.  I am concerned about low output heart failure though she is euvolemic on exam.  6.  Type 2 diabetes mellitus: Poorly controlled despite high-dose insulin.  A1c was 13.1 in October.  She is managed by primary care.  7.  Disposition: Follow-up basic metabolic panel today.  Follow-up in clinic in 2 to 4 weeks for additional medication titration if possible.   Murray Hodgkins, NP 10/16/2018, 5:51 PM

## 2018-10-17 LAB — BASIC METABOLIC PANEL
BUN/Creatinine Ratio: 15 (ref 12–28)
BUN: 13 mg/dL (ref 8–27)
CHLORIDE: 94 mmol/L — AB (ref 96–106)
CO2: 21 mmol/L (ref 20–29)
Calcium: 9.7 mg/dL (ref 8.7–10.3)
Creatinine, Ser: 0.85 mg/dL (ref 0.57–1.00)
GFR, EST AFRICAN AMERICAN: 82 mL/min/{1.73_m2} (ref >59)
GFR, EST NON AFRICAN AMERICAN: 71 mL/min/{1.73_m2} (ref >59)
Glucose: 483 mg/dL — ABNORMAL HIGH (ref 65–99)
POTASSIUM: 4.4 mmol/L (ref 3.5–5.2)
SODIUM: 135 mmol/L (ref 134–144)

## 2018-10-28 ENCOUNTER — Encounter: Payer: Self-pay | Admitting: Family Medicine

## 2018-10-30 ENCOUNTER — Emergency Department: Payer: Medicare HMO

## 2018-10-30 ENCOUNTER — Emergency Department
Admission: EM | Admit: 2018-10-30 | Discharge: 2018-10-30 | Disposition: A | Discharge status: Home / Self Care | Payer: Medicare HMO | Attending: Emergency Medicine | Admitting: Emergency Medicine

## 2018-10-30 ENCOUNTER — Other Ambulatory Visit: Payer: Self-pay

## 2018-10-30 ENCOUNTER — Telehealth: Payer: Self-pay

## 2018-10-30 DIAGNOSIS — S299XXA Unspecified injury of thorax, initial encounter: Secondary | ICD-10-CM | POA: Diagnosis not present

## 2018-10-30 DIAGNOSIS — I110 Hypertensive heart disease with heart failure: Secondary | ICD-10-CM | POA: Insufficient documentation

## 2018-10-30 DIAGNOSIS — Z87891 Personal history of nicotine dependence: Secondary | ICD-10-CM | POA: Diagnosis not present

## 2018-10-30 DIAGNOSIS — I5023 Acute on chronic systolic (congestive) heart failure: Secondary | ICD-10-CM | POA: Diagnosis not present

## 2018-10-30 DIAGNOSIS — I2510 Atherosclerotic heart disease of native coronary artery without angina pectoris: Secondary | ICD-10-CM | POA: Diagnosis not present

## 2018-10-30 DIAGNOSIS — S0990XA Unspecified injury of head, initial encounter: Secondary | ICD-10-CM | POA: Diagnosis not present

## 2018-10-30 DIAGNOSIS — R410 Disorientation, unspecified: Secondary | ICD-10-CM | POA: Diagnosis not present

## 2018-10-30 DIAGNOSIS — E1159 Type 2 diabetes mellitus with other circulatory complications: Secondary | ICD-10-CM | POA: Insufficient documentation

## 2018-10-30 DIAGNOSIS — E860 Dehydration: Secondary | ICD-10-CM | POA: Insufficient documentation

## 2018-10-30 DIAGNOSIS — E1110 Type 2 diabetes mellitus with ketoacidosis without coma: Secondary | ICD-10-CM | POA: Diagnosis not present

## 2018-10-30 DIAGNOSIS — R000 Tachycardia, unspecified: Secondary | ICD-10-CM | POA: Diagnosis not present

## 2018-10-30 DIAGNOSIS — R4182 Altered mental status, unspecified: Secondary | ICD-10-CM | POA: Diagnosis present

## 2018-10-30 LAB — COMPREHENSIVE METABOLIC PANEL
ALT: 34 U/L (ref 0–44)
AST: 44 U/L — ABNORMAL HIGH (ref 15–41)
Albumin: 4.6 g/dL (ref 3.5–5.0)
Alkaline Phosphatase: 121 U/L (ref 38–126)
Anion gap: 14 (ref 5–15)
BUN: 24 mg/dL — ABNORMAL HIGH (ref 8–23)
CALCIUM: 10.9 mg/dL — AB (ref 8.9–10.3)
CO2: 28 mmol/L (ref 22–32)
Chloride: 88 mmol/L — ABNORMAL LOW (ref 98–111)
Creatinine, Ser: 1.82 mg/dL — ABNORMAL HIGH (ref 0.44–1.00)
GFR calc Af Amer: 33 mL/min — ABNORMAL LOW (ref >60)
GFR calc non Af Amer: 28 mL/min — ABNORMAL LOW (ref >60)
Glucose, Bld: 354 mg/dL — ABNORMAL HIGH (ref 70–99)
Potassium: 4 mmol/L (ref 3.5–5.1)
Sodium: 130 mmol/L — ABNORMAL LOW (ref 135–145)
Total Bilirubin: 1 mg/dL (ref 0.3–1.2)
Total Protein: 8.4 g/dL — ABNORMAL HIGH (ref 6.5–8.1)

## 2018-10-30 LAB — DIFFERENTIAL
Abs Immature Granulocytes: 0.07 10*3/uL (ref 0.00–0.07)
BASOS ABS: 0.1 10*3/uL (ref 0.0–0.1)
Basophils Relative: 1 %
EOS PCT: 1 %
Eosinophils Absolute: 0.1 10*3/uL (ref 0.0–0.5)
IMMATURE GRANULOCYTES: 1 %
Lymphocytes Relative: 32 %
Lymphs Abs: 4.9 10*3/uL — ABNORMAL HIGH (ref 0.7–4.0)
Monocytes Absolute: 1.2 10*3/uL — ABNORMAL HIGH (ref 0.1–1.0)
Monocytes Relative: 8 %
Neutro Abs: 9 10*3/uL — ABNORMAL HIGH (ref 1.7–7.7)
Neutrophils Relative %: 57 %

## 2018-10-30 LAB — PROTIME-INR
INR: 0.98
Prothrombin Time: 12.9 seconds (ref 11.4–15.2)

## 2018-10-30 LAB — CBC
HEMATOCRIT: 50.8 % — AB (ref 36.0–46.0)
Hemoglobin: 17 g/dL — ABNORMAL HIGH (ref 12.0–15.0)
MCH: 29.3 pg (ref 26.0–34.0)
MCHC: 33.5 g/dL (ref 30.0–36.0)
MCV: 87.4 fL (ref 80.0–100.0)
Platelets: 226 10*3/uL (ref 150–400)
RBC: 5.81 MIL/uL — ABNORMAL HIGH (ref 3.87–5.11)
RDW: 12.9 % (ref 11.5–15.5)
WBC: 15.3 10*3/uL — ABNORMAL HIGH (ref 4.0–10.5)
nRBC: 0 % (ref 0.0–0.2)

## 2018-10-30 LAB — CK: Total CK: 121 U/L (ref 38–234)

## 2018-10-30 LAB — APTT: aPTT: 31 seconds (ref 24–36)

## 2018-10-30 LAB — TROPONIN I: Troponin I: 0.04 ng/mL (ref <0.03)

## 2018-10-30 LAB — GLUCOSE, CAPILLARY: Glucose-Capillary: 346 mg/dL — ABNORMAL HIGH (ref 70–99)

## 2018-10-30 MED ORDER — SODIUM CHLORIDE 0.9 % IV BOLUS
500.0000 mL | Freq: Once | INTRAVENOUS | Status: AC
  Administered 2018-10-30: 500 mL via INTRAVENOUS

## 2018-10-30 NOTE — ED Triage Notes (Signed)
Pt arrived with niece. Pt reports falling early thing morning at an estimated 0600. PT states she laid on the floor and then called her niece at 4. Pt's niece arrived and reported pt was confused. Pt's responses in triage delayed. Pt reports back pain that is worse with movement.Pt a&o x 4.  No slurred speech, face symmetrical, grips equal and strong.

## 2018-10-30 NOTE — Telephone Encounter (Signed)
-----   Message from Minna Merritts, MD sent at 10/30/2018  1:37 PM EST ----- Regarding: er Seen in the emergency room for orthostasis symptoms Lab work appeared markedly dehydrated Discussed with Dr. Jimmye Norman,  Recommend she liberalize her fluid intake, stop spironolactone,  He will give small amount IV fluids in the emergency room She will need BMP in 1 week to make sure numbers are improving Thx TG

## 2018-10-30 NOTE — Telephone Encounter (Signed)
Note per Dr. Rockey Situ, pt seen for dehydration. She has previously scheduled f/u next Friday with Dr. Rockey Situ. Note made in appt comments for BMET redraw.   No further orders at this time.

## 2018-10-30 NOTE — ED Provider Notes (Signed)
[facility] Emergency Department Provider Note       Time seen: ----------------------------------------- 2:32 PM on 10/30/2018 -----------------------------------------   I have reviewed the triage vital signs and the nursing notes.  HISTORY   Chief Complaint Altered Mental Status and Fall   HPI Kristy [name] is a 68 y.o. female with a history of Crohn's disease, GERD, gout, heart failure, hyperlipidemia, hypertension, cardiomyopathy who presents to the ED for falling this morning at approximately 6 AM.  Patient states she laid on the floor and then called her niece.  Niece arrived and she was confused.  Patient reports some back pain is worse with movement.  Recently she has had some medication adjustments regarding her cardiomyopathy.  She is taking anti-diuretic as well as an antiarrhythmic agent.  Past Medical History:  Diagnosis Date  . Allergic rhinitis   . Allergy   . Cataract    mild   . Crohn's disease ([place]) 10/13/2013  . Diverticulosis   . GERD (gastroesophageal reflux disease)   . Gout   . HFrEF (heart failure with reduced ejection fraction) ([place])    a. 12/2008 Cath: EF 45% w/ inf HK; b. 07/2009 Echo: EF 50-55%; c. 08/2018 Echo: EF 20-25%.  Marland Kitchen Hyperlipidemia   . Hypertension   . IBS (irritable bowel syndrome)   . Left bundle branch block   . Neuromuscular disorder (HCC)    neuropathy  . NICM (nonischemic cardiomyopathy) ([place])    a. 12/2008 Cath: no significant dzs, EF 45% w/ inf HK->Med Rx; b. 07/2009 Echo: EF 50-55%; c. 08/2018 Echo: EF 20-25%, ant/antsept HK, mild MR, mildly dil LA, nl RV fx; d. 08/2018 Cath: D1 80, otw nonobs dzs->Med rx.  . Non-obstructive CAD (coronary artery disease)    a. 12/2008 Cath: no significant dzs, EF 45% w/ inf HK->Med Rx; b. 08/2018 Cath: LM nl, LAD min irregs, D1 80, LCX 29md, RCA min irregs->Med Rx.  . Osteoarthritis   . Poorly controlled Diabetes mellitus    a. 07/2018 A1c 13.1.  .Marland KitchenSymptomatic  cholelithiasis     Patient Active Problem List   Diagnosis Date Noted  . Acute on chronic systolic CHF (congestive heart failure) ([place] 08/22/2018  . DKA (diabetic ketoacidoses) ([place] 05/13/2018  . AKI (acute kidney injury) ([place] 05/13/2018  . Coronary artery disease involving native heart without angina pectoris 09/13/2016  . Primary osteoarthritis involving multiple joints 03/14/2015  . Personal history of other malignant neoplasm of skin 12/01/2014  . Crohn's disease ([place] 10/13/2013  . Major depressive disorder, recurrent, in full remission ([facility] 05/01/2011  . TRANSAMINASES, SERUM, ELEVATED 05/01/2010  . PSORIASIS 08/02/2009  . Hyperlipidemia 05/11/2009  . IRRITABLE BOWEL SYNDROME 03/10/2009  . GOUT 02/05/2009  . Type 2 diabetes mellitus with vascular disease ([place]   . Essential hypertension 04/18/2007  . LBBB (left bundle branch block) 04/18/2007  . ALLERGIC RHINITIS 04/18/2007  . GALLSTONES 04/18/2007  . Diverticulitis 04/18/2007    Past Surgical History:  Procedure Laterality Date  . COLONOSCOPY    . DILATION AND CURETTAGE OF UTERUS    . RIGHT/LEFT HEART CATH AND CORONARY ANGIOGRAPHY N/A 08/26/2018   Procedure: RIGHT/LEFT HEART CATH AND CORONARY ANGIOGRAPHY;  Surgeon: AWellington Hampshire MD;  [facility];  Service: Cardiovascular;  Laterality: N/A;  . SHOULDER SURGERY     15 + yrs ago   . SKIN SURGERY     nose   . VAGINAL HYSTERECTOMY      Allergies Fish oil; Glimepiride; Guanfacine hcl;  and Rosiglitazone  Social History Social History   Tobacco Use  . Smoking status: Former Smoker    Packs/day: 0.75    Years: 30.00    Pack years: 22.50    Types: Cigarettes    Last attempt to quit: 06/06/1997    Years since quitting: 21.4  . Smokeless tobacco: Never Used  Substance Use Topics  . Alcohol use: No  . Drug use: No   Review of Systems Constitutional: Negative for fever. Cardiovascular: Negative for chest pain. Respiratory: Negative for  shortness of breath. Gastrointestinal: Negative for abdominal pain, vomiting and diarrhea. Musculoskeletal: Negative for back pain. Skin: Negative for rash. Neurological: Negative for headaches, positive for weakness  All systems negative/normal/unremarkable except as stated in the HPI  ____________________________________________   PHYSICAL EXAM:  VITAL SIGNS: ED Triage Vitals  Enc Vitals Group     BP 10/30/18 1143 (!) 98/59     Pulse Rate 10/30/18 1143 98     Resp 10/30/18 1322 18     Temp 10/30/18 1143 97.7 F (36.5 C)     Temp Source 10/30/18 1143 Oral     SpO2 10/30/18 1143 97 %     Weight 10/30/18 1143 154 lb (69.9 kg)     Height 10/30/18 1143 5' 3"  (1.6 m)     Head Circumference --      Peak Flow --      Pain Score 10/30/18 1151 7     Pain Loc --      Pain Edu? --      Excl. in [place]? --    Constitutional: Alert and oriented. Well appearing and in no distress. Eyes: Conjunctivae are normal. Normal extraocular movements. ENT      Head: Normocephalic and atraumatic.      Nose: No congestion/rhinnorhea.      Mouth/Throat: Mucous membranes are moist.      Neck: No stridor. Cardiovascular: Normal rate, regular rhythm. No murmurs, rubs, or gallops. Respiratory: Normal respiratory effort without tachypnea nor retractions. Breath sounds are clear and equal bilaterally. No wheezes/rales/rhonchi. Gastrointestinal: Soft and nontender. Normal bowel sounds Musculoskeletal: Nontender with normal range of motion in extremities. No lower extremity tenderness nor edema. Neurologic:  Normal speech and language. No gross focal neurologic deficits are appreciated.  Skin:  Skin is warm, dry and intact. No rash noted. Psychiatric: Mood and affect are normal. Speech and behavior are normal.  ____________________________________________  EKG: Interpreted by me.  Sinus tachycardia with a rate of 101 bpm, left bundle branch block, leftward axis, long  QT  ____________________________________________  ED COURSE:  As part of my medical decision making, I reviewed the following data within the [place] History obtained from family if available, nursing notes, old chart and ekg, as well as notes from prior ED visits. Patient presented for weakness with recent fall, we will assess with labs and imaging as indicated at this time.   Procedures ____________________________________________   LABS (pertinent positives/negatives)  Labs Reviewed  GLUCOSE, CAPILLARY - Abnormal; Notable for the following components:      Result Value   Glucose-Capillary 346 (*)    All other components within normal limits  CBC - Abnormal; Notable for the following components:   WBC 15.3 (*)    RBC 5.81 (*)    Hemoglobin 17.0 (*)    HCT 50.8 (*)    All other components within normal limits  DIFFERENTIAL - Abnormal; Notable for the following components:   Neutro Abs 9.0 (*)  Lymphs Abs 4.9 (*)    Monocytes Absolute 1.2 (*)    All other components within normal limits  COMPREHENSIVE METABOLIC PANEL - Abnormal; Notable for the following components:   Sodium 130 (*)    Chloride 88 (*)    Glucose, Bld 354 (*)    BUN 24 (*)    Creatinine, Ser 1.82 (*)    Calcium 10.9 (*)    Total Protein 8.4 (*)    AST 44 (*)    GFR calc non Af Amer 28 (*)    GFR calc Af Amer 33 (*)    All other components within normal limits  TROPONIN I - Abnormal; Notable for the following components:   Troponin I 0.04 (*)    All other components within normal limits  PROTIME-INR  APTT  CK  URINALYSIS, COMPLETE (UACMP) WITH MICROSCOPIC  CBG MONITORING, ED    RADIOLOGY  Chest x-ray IMPRESSION: No active cardiopulmonary disease.  ____________________________________________   DIFFERENTIAL DIAGNOSIS   Dehydration, electrolyte abnormality, medication side effect, arrhythmia  FINAL ASSESSMENT AND PLAN  Dehydration   Plan: The patient had presented  for weakness with fall. Patient's labs did indicate dehydration. Patient's imaging was reassuring.  She was given IV fluids here.  I discussed with cardiology, we will hold her spironolactone.  She will be followed up with closely in the next week for reevaluation.   Laurence Aly, MD    Note: This note was generated in part or whole with voice recognition software. Voice recognition is usually quite accurate but there are transcription errors that can and very often do occur. I apologize for any typographical errors that were not detected and corrected.     Earleen Newport, MD 10/30/18 1434

## 2018-11-04 ENCOUNTER — Telehealth: Payer: Self-pay | Admitting: *Deleted

## 2018-11-04 DIAGNOSIS — R69 Illness, unspecified: Secondary | ICD-10-CM | POA: Diagnosis not present

## 2018-11-04 NOTE — Telephone Encounter (Signed)
Received fax from [place] requesting PA for Humulin R U-500.  PA completed on CoverMyMeds.  Sent for review.  Can take up to 72 hours for a decision.

## 2018-11-04 NOTE — Progress Notes (Signed)
Cardiology Office Note  Date:  11/04/2018   ID:  Kristy, [name] 1950-10-28, MRN [id number]  PCP:  Owens Loffler, MD   No chief complaint on file.  Cardiology Office Note  Date:  11/08/2018   ID:  Kristy, [name] 1950-11-27, MRN [id number]  PCP:  Owens Loffler, MD   Chief Complaint  Patient presents with  . other    Follow up from [facility] ER; dehydration. Meds reviewed by the pt. verbally.     HPI:  [name] is a 68 yo with HTN,  diabetes, Hemoglobin A1c of 12.4, poorly controlled GI disease/IBS,  Hyperlipidemia, LDL 168 h/o smoking,  Previous cardiac catheterization in 2009 showed mild luminal irregularities.   Crohn's disease. Normal EF in 07/2009 chronic tachycardia Nonischemic cardiomyopathy ejection fraction 20 to 25% presents for followup of her hyperlipidemia, coronary artery disease and tachycardia  Recently started on Entresto and spironolactone In follow-up visit in the office October 16, 2018 Was started on Corlinor 5 mg twice a day Lab work done with normal renal function, glucose level 483  Seen in the emergency room October 30, 2018 Had a fall, was confused, back pain Dehydrated with creatinine 1.82 BUN 24 glucose 350  Long discussion with her today concerning her diabetes control Long history dating back several years Has not had follow-up with endocrine in quite some time Previously seen by endocrine in [place] and [place] Was recommended to go to [place], did not go Reports that insulin does not work for her  Severe gait instability leading to falls High price for her insulin $500 per month Reports that the recent cardiac medication corlinor was $350  EKG personally reviewed by myself on todays visit Shows normal sinus rhythm rate 84 bpm left bundle branch block  Other past medical history reviewed with her on today's visit Cardiac cath 08/2018, reviewed with her in detail  Significant one-vessel coronary artery disease  involving first diagonal with mild to moderate disease in the main vessels.   EF was severely reduced by echo.  Echo 08/2018 - Left ventricle: The cavity size was mildly dilated. Systolic   function was severely reduced. The estimated ejection fraction   was in the range of 20% to 25%. Hypokinesis of the anterior   myocardium. Hypokinesis of the anteroseptal myocardium. The study   is not technically sufficient to allow evaluation of LV diastolic   function.  Intolerance to metoprolol and carvedilol  Husband passed away from complications of severe underlying lung disease, fibrosis    retired August 2017 Previously reported that "none of her diabetes medications work, on "300 units of insulin, does not work"  Last echo in 10/10 showed EF 50-55% with dysynnergy due to left bundle branch block.    PMH:   has a past medical history of Allergic rhinitis, Allergy, Cataract, Crohn's disease ([place]) (10/13/2013), Diverticulosis, GERD (gastroesophageal reflux disease), Gout, HFrEF (heart failure with reduced ejection fraction) ([place]), Hyperlipidemia, Hypertension, IBS (irritable bowel syndrome), Left bundle branch block, Neuromuscular disorder ([place]), NICM (nonischemic cardiomyopathy) ([place]), Non-obstructive CAD (coronary artery disease), Osteoarthritis, Poorly controlled Diabetes mellitus, and Symptomatic cholelithiasis.  PSH:    Past Surgical History:  Procedure Laterality Date  . COLONOSCOPY    . DILATION AND CURETTAGE OF UTERUS    . RIGHT/LEFT HEART CATH AND CORONARY ANGIOGRAPHY N/A 08/26/2018   Procedure: RIGHT/LEFT HEART CATH AND CORONARY ANGIOGRAPHY;  Surgeon: Wellington Hampshire, MD;  Location: [place] [facility];  Service: Cardiovascular;  Laterality: N/A;  . SHOULDER  SURGERY     15 + yrs ago   . SKIN SURGERY     nose   . VAGINAL HYSTERECTOMY      Current Outpatient Medications  Medication Sig Dispense Refill  . acetaminophen (TYLENOL) 325 MG tablet Take 325 mg by mouth daily as  needed for moderate pain or headache.    . carvedilol (COREG) 6.25 MG tablet Take 1 tablet (6.25 mg total) by mouth 2 (two) times daily. 180 tablet 3  . chlorpheniramine (CHLOR-TRIMETON) 4 MG tablet Take 4 mg by mouth daily as needed for allergies.    Marland Kitchen FLUoxetine (PROZAC) 20 MG capsule Alternate 2 tabs daily and then 1 tab the next day (Patient taking differently: Take 20 mg by mouth daily. Taking one a day) 135 capsule 1  . insulin regular human CONCENTRATED (HUMULIN R) 500 UNIT/ML injection Inject 0.24 mLs (120 Units total) into the skin 3 (three) times daily with meals. (Patient taking differently: Inject 50-100 Units into the skin 4 (four) times daily. ) 60 mL 3  . Insulin Syringe-Needle U-100 (B-D INS SYRINGE 0.5CC/31GX5/16) 31G X 5/16" 0.5 ML MISC USE AS DIRECTED THREE TIMES A DAY 100 each 11  . ivabradine (CORLANOR) 5 MG TABS tablet Take 1 tablet (5 mg total) by mouth 2 (two) times daily with a meal. 60 tablet 2  . metroNIDAZOLE (METROGEL) 0.75 % gel Apply 1 application topically daily as needed (acne).    . NONFORMULARY OR COMPOUNDED ITEM See pharmacy note (Patient taking differently: Apply 1 application topically at bedtime as needed (neuropathy). Peripheral Neuropathy Cream-  Bupivacaine 1%, Doxepin 3%, Gabapentin 6%, Pentoxifylline 3%, Topiramate 1%  Apply 1-2 grams to affected area 3-4 times daily  Qty. 120 gm) 120 each 2  . ONE TOUCH ULTRA TEST test strip USE AS PER PACKAGE INSTRUCTIONS CHECK SUGARS UP TO-FOUR TIMES A DAY OR AS PRESCRIBED 150 each 11  . Probiotic Product (ALIGN) 4 MG CAPS Take 4 mg by mouth daily.     . sacubitril-valsartan (ENTRESTO) 49-51 MG Take 1 tablet (49/51 mg) by mouth 2 times daily starting on Sunday 09/15/18 60 tablet 1   No current facility-administered medications for this visit.      Allergies:   Fish oil; Glimepiride; Guanfacine hcl; and Rosiglitazone   Social History:  The patient  reports that she quit smoking about 21 years ago. Her smoking use  included cigarettes. She has a 22.50 pack-year smoking history. She has never used smokeless tobacco. She reports that she does not drink alcohol or use drugs.   Family History:   family history includes Hypertension in her father.    Review of Systems: Review of Systems  Constitutional: Negative.        Polydipsia, polyuria  Respiratory: Negative.   Cardiovascular: Negative.   Gastrointestinal: Negative.   Musculoskeletal: Negative.        Gait instability  Neurological: Negative.   Psychiatric/Behavioral: Negative.   All other systems reviewed and are negative.    PHYSICAL EXAM: VS:  BP 122/78 (BP Location: Left Arm, Patient Position: Sitting, Cuff Size: Normal)   Pulse 84   Ht 5' 3"  (1.6 m)   Wt 151 lb 4 oz (68.6 kg)   BMI 26.79 kg/m  , BMI Body mass index is 26.79 kg/m. GEN: Well nourished, well developed, in no acute distress , unstable on her feet HEENT: normal  Neck: no JVD, carotid bruits, or masses Cardiac: Regular rhythm, tachycardia,; no murmurs, rubs, or gallops,no edema  Respiratory:  clear  to auscultation bilaterally, normal work of breathing GI: soft, nontender, nondistended, + BS MS: no deformity or atrophy  Skin: warm and dry, no rash Neuro:  Strength and sensation are intact Psych: euthymic mood, full affect   Recent Labs: 07/11/2018: TSH 3.88 10/30/2018: ALT 34; BUN 24; Creatinine, Ser 1.82; Hemoglobin 17.0; Platelets 226; Potassium 4.0; Sodium 130    Lipid Panel Lab Results  Component Value Date   CHOL 263 (H) 07/11/2018   HDL 36.90 (L) 07/11/2018   LDLCALC 75 04/26/2009   TRIG 244.0 (H) 07/11/2018     Wt Readings from Last 3 Encounters:  11/08/18 151 lb 4 oz (68.6 kg)  10/30/18 154 lb (69.9 kg)  10/16/18 157 lb (71.2 kg)     ASSESSMENT AND PLAN:   Essential hypertension - Plan: EKG 12-Lead No medication changes made, blood pressure adequate Reports that she is able to afford the Entresto and carvedilol She will call us if the  corlanor continues to have a high co-pay  Sinus tachycardia - Plan: EKG 12-Lead Heart rate improved on current regiment  LBBB (left bundle branch block) - Plan: EKG 12-Lead No change Single-vessel disease, medical management  Type 2 diabetes mellitus with vascular disease ([place]) Hemoglobin A1c  12 She is willing to seek a second opinion from endocrinology at [facility] that she has seen endocrine locally and they are not able to help her We will discuss with primary care, we can place a referral if needed  Other hyperlipidemia Previously indicated she does not want a statin Will discuss again with her in follow-up given her severe single-vessel disease  CAD Recent cardiac catheterization severe single-vessel disease, medical management  Nonischemic cardiomyopathy Continue current medications, repeat echocardiogram in 1 month Stressed the importance of aggressive diabetes control  Acute renal failure Secondary to poorly controlled diabetes, polyuria Stressed importance of getting her diabetes under control Recent numbers close to 500  Long discussion with her concerning recent medication changes for cardiomyopathy, recent hospitalization, we spent quite some time discussing her diabetes and the need for better control, need for referrals to [facility],  Discussed her diet, discussed complication of diabetes including renal failure  Total encounter time more than 45 minutes  Greater than 50% was spent in counseling and coordination of care with the patient  Disposition:   F/U  6 months   No orders of the defined types were placed in this encounter.    Signed, Esmond Plants, M.D., Ph.D. 11/08/2018  [place], [place]

## 2018-11-05 NOTE — Telephone Encounter (Signed)
PA approved effective 09/30/2018 through 10/02/2019.  [facility] notified of approval via fax.

## 2018-11-08 ENCOUNTER — Encounter: Payer: Self-pay | Admitting: Cardiovascular Disease

## 2018-11-08 ENCOUNTER — Ambulatory Visit: Payer: Medicare HMO | Admitting: Cardiovascular Disease

## 2018-11-08 VITALS — BP 122/78 | HR 84 | Ht 63.0 in | Wt 151.2 lb

## 2018-11-08 DIAGNOSIS — Z794 Long term (current) use of insulin: Secondary | ICD-10-CM

## 2018-11-08 DIAGNOSIS — R000 Tachycardia, unspecified: Secondary | ICD-10-CM | POA: Diagnosis not present

## 2018-11-08 DIAGNOSIS — I5022 Chronic systolic (congestive) heart failure: Secondary | ICD-10-CM | POA: Diagnosis not present

## 2018-11-08 DIAGNOSIS — N170 Acute kidney failure with tubular necrosis: Secondary | ICD-10-CM

## 2018-11-08 DIAGNOSIS — E782 Mixed hyperlipidemia: Secondary | ICD-10-CM

## 2018-11-08 DIAGNOSIS — E119 Type 2 diabetes mellitus without complications: Secondary | ICD-10-CM

## 2018-11-08 DIAGNOSIS — I2510 Atherosclerotic heart disease of native coronary artery without angina pectoris: Secondary | ICD-10-CM

## 2018-11-08 DIAGNOSIS — I10 Essential (primary) hypertension: Secondary | ICD-10-CM | POA: Diagnosis not present

## 2018-11-08 DIAGNOSIS — I428 Other cardiomyopathies: Secondary | ICD-10-CM

## 2018-11-08 NOTE — Patient Instructions (Addendum)
Medication Instructions:  No changes  If you need a refill on your cardiac medications before your next appointment, please call your pharmacy.    Lab work: No new labs needed   If you have labs (blood work) drawn today and your tests are completely normal, you will receive your results only by: Marland Kitchen MyChart Message (if you have MyChart) OR . A paper copy in the mail If you have any lab test that is abnormal or we need to change your treatment, we will call you to review the results.   Testing/Procedures: We will order an echo in a month for cardiomyopathy, nonischemic   Follow-Up: At [facility], you and your health needs are our priority.  As part of our continuing mission to provide you with exceptional heart care, we have created designated Provider Care Teams.  These Care Teams include your primary Cardiologist (physician) and Advanced Practice Providers (APPs -  Physician Assistants and Nurse Practitioners) who all work together to provide you with the care you need, when you need it.  . You will need a follow up appointment in 6 months .   Please call our office 2 months in advance to schedule this appointment.    . Providers on your designated Care Team:   . Murray Hodgkins, NP . Christell Faith, PA-C . Marrianne Mood, PA-C  Any Other Special Instructions Will Be Listed Below (If Applicable).  For educational health videos Log in to : [email or web address] Or : [email or web address], password : [email or web address]    Echocardiogram An echocardiogram is a procedure that uses painless sound waves (ultrasound) to produce an image of the heart. Images from an echocardiogram can provide important information about:  Signs of coronary artery disease (CAD).  Aneurysm detection. An aneurysm is a weak or damaged part of an artery wall that bulges out from the normal force of blood pumping through the body.  Heart size and shape. Changes in the size or shape of the heart can be associated with certain  conditions, including heart failure, aneurysm, and CAD.  Heart muscle function.  Heart valve function.  Signs of a past heart attack.  Fluid buildup around the heart.  Thickening of the heart muscle.  A tumor or infectious growth around the heart valves. Tell a health care provider about:  Any allergies you have.  All medicines you are taking, including vitamins, herbs, eye drops, creams, and over-the-counter medicines.  Any blood disorders you have.  Any surgeries you have had.  Any medical conditions you have.  Whether you are pregnant or may be pregnant. What are the risks? Generally, this is a safe procedure. However, problems may occur, including:  Allergic reaction to dye (contrast) that may be used during the procedure. What happens before the procedure? No specific preparation is needed. You may eat and drink normally. What happens during the procedure?   An IV tube may be inserted into one of your veins.  You may receive contrast through this tube. A contrast is an injection that improves the quality of the pictures from your heart.  A gel will be applied to your chest.  A wand-like tool (transducer) will be moved over your chest. The gel will help to transmit the sound waves from the transducer.  The sound waves will harmlessly bounce off of your heart to allow the heart images to be captured in real-time motion. The images will be recorded on a computer. The procedure may vary among health care providers and hospitals. What happens  after the procedure?  You may return to your normal, everyday life, including diet, activities, and medicines, unless your health care provider tells you not to do that. Summary  An echocardiogram is a procedure that uses painless sound waves (ultrasound) to produce an image of the heart.  Images from an echocardiogram can provide important information about the size and shape of your heart, heart muscle function, heart valve  function, and fluid buildup around your heart.  You do not need to do anything to prepare before this procedure. You may eat and drink normally.  After the echocardiogram is completed, you may return to your normal, everyday life, unless your health care provider tells you not to do that. This information is not intended to replace advice given to you by your health care provider. Make sure you discuss any questions you have with your health care provider. Document Released: 09/15/2000 Document Revised: 10/21/2016 Document Reviewed: 10/21/2016 Elsevier Interactive Patient Education  2019 [employer].

## 2018-11-10 ENCOUNTER — Other Ambulatory Visit: Payer: Self-pay | Admitting: Family Medicine

## 2018-11-10 DIAGNOSIS — E1159 Type 2 diabetes mellitus with other circulatory complications: Secondary | ICD-10-CM

## 2018-12-02 ENCOUNTER — Encounter: Payer: Self-pay | Admitting: Family Medicine

## 2018-12-02 DIAGNOSIS — K5090 Crohn's disease, unspecified, without complications: Secondary | ICD-10-CM | POA: Diagnosis not present

## 2018-12-02 DIAGNOSIS — Z833 Family history of diabetes mellitus: Secondary | ICD-10-CM | POA: Diagnosis not present

## 2018-12-02 DIAGNOSIS — Z794 Long term (current) use of insulin: Secondary | ICD-10-CM | POA: Diagnosis not present

## 2018-12-02 DIAGNOSIS — E876 Hypokalemia: Secondary | ICD-10-CM | POA: Diagnosis not present

## 2018-12-02 DIAGNOSIS — E1165 Type 2 diabetes mellitus with hyperglycemia: Secondary | ICD-10-CM | POA: Diagnosis not present

## 2018-12-02 DIAGNOSIS — Z87891 Personal history of nicotine dependence: Secondary | ICD-10-CM | POA: Diagnosis not present

## 2018-12-02 DIAGNOSIS — Z5189 Encounter for other specified aftercare: Secondary | ICD-10-CM | POA: Diagnosis not present

## 2018-12-02 LAB — HEMOGLOBIN A1C: Hemoglobin A1C: >14

## 2018-12-04 ENCOUNTER — Telehealth: Payer: Self-pay | Admitting: Family Medicine

## 2018-12-04 DIAGNOSIS — E876 Hypokalemia: Secondary | ICD-10-CM

## 2018-12-04 NOTE — Telephone Encounter (Signed)
She may have already heard from [place], but:  Her potassium came back at 2.9 on her labs.  This is not an emergency, but we should add some extra potassium for a few days, and recheck her level in 1 week.  If [facility] has sent her in potassium already, f/u potassium level in 1 week, hypokalemia  If [facility] has not sent her in potassium, then  Potassium chloride, 20 meq, 1 tablet po bid x 1 day. Then 1 tablet po x 3 days. Recheck potassium 1 week, hypokalemia  #5 tabs.  Her potassium has always been basically normal in the past.

## 2018-12-04 NOTE — Telephone Encounter (Signed)
Spoke with Coralyn Mark.  [facility] did contact her about her K+ level.  They sent her in a Rx for potassium and she started it yesterday.  Lab appointment scheduled for 12/10/2018 at 10:00 am to recheck labs.  Future orders entered in Epic.

## 2018-12-04 NOTE — Addendum Note (Signed)
Addended by: [name] on: 12/04/2018 04:30 PM   Modules accepted: Orders

## 2018-12-10 ENCOUNTER — Other Ambulatory Visit (INDEPENDENT_AMBULATORY_CARE_PROVIDER_SITE_OTHER): Payer: Medicare HMO

## 2018-12-10 ENCOUNTER — Ambulatory Visit (INDEPENDENT_AMBULATORY_CARE_PROVIDER_SITE_OTHER): Payer: Medicare HMO

## 2018-12-10 ENCOUNTER — Telehealth: Payer: Self-pay | Admitting: *Deleted

## 2018-12-10 DIAGNOSIS — E119 Type 2 diabetes mellitus without complications: Secondary | ICD-10-CM | POA: Diagnosis not present

## 2018-12-10 DIAGNOSIS — E876 Hypokalemia: Secondary | ICD-10-CM | POA: Diagnosis not present

## 2018-12-10 DIAGNOSIS — I428 Other cardiomyopathies: Secondary | ICD-10-CM | POA: Diagnosis not present

## 2018-12-10 LAB — BASIC METABOLIC PANEL
BUN: 17 mg/dL (ref 6–23)
CO2: 27 mEq/L (ref 19–32)
Calcium: 9.7 mg/dL (ref 8.4–10.5)
Chloride: 101 mEq/L (ref 96–112)
Creatinine, Ser: 0.76 mg/dL (ref 0.40–1.20)
GFR: 75.79 mL/min (ref >60.00)
Glucose, Bld: 374 mg/dL — ABNORMAL HIGH (ref 70–99)
Potassium: 4.4 mEq/L (ref 3.5–5.1)
Sodium: 136 mEq/L (ref 135–145)

## 2018-12-10 LAB — MICROALBUMIN / CREATININE URINE RATIO
Creatinine,U: 80.9 mg/dL
Microalb Creat Ratio: 22.4 mg/g (ref 0.0–30.0)
Microalb, Ur: 18.1 mg/dL — ABNORMAL HIGH (ref 0.0–1.9)

## 2018-12-10 MED ORDER — INSULIN REGULAR HUMAN (CONC) 500 UNIT/ML ~~LOC~~ SOPN: 100.0000 [IU] | PEN_INJECTOR | Freq: Three times a day (TID) | SUBCUTANEOUS | 3 refills | Status: DC

## 2018-12-10 NOTE — Telephone Encounter (Signed)
Will complete

## 2018-12-10 NOTE — Telephone Encounter (Signed)
[facility] Application completed for Concentrated Humulin R Kwikpens.  Forms placed in Dr. Lillie Fragmin in box to sign.

## 2018-12-11 MED ORDER — INSULIN REGULAR HUMAN (CONC) 500 UNIT/ML ~~LOC~~ SOPN: 100.0000 [IU] | PEN_INJECTOR | Freq: Three times a day (TID) | SUBCUTANEOUS | 3 refills | Status: DC

## 2018-12-11 NOTE — Telephone Encounter (Signed)
Left message for Reema that our part of the application is complete but that there is a patient part that she needs to sign and provide income information for.

## 2018-12-11 NOTE — Addendum Note (Signed)
Addended by: [name] on: 12/11/2018 02:25 PM   Modules accepted: Orders

## 2018-12-14 DIAGNOSIS — R69 Illness, unspecified: Secondary | ICD-10-CM | POA: Diagnosis not present

## 2018-12-16 DIAGNOSIS — E1165 Type 2 diabetes mellitus with hyperglycemia: Secondary | ICD-10-CM | POA: Diagnosis not present

## 2018-12-16 DIAGNOSIS — Z794 Long term (current) use of insulin: Secondary | ICD-10-CM | POA: Diagnosis not present

## 2018-12-16 DIAGNOSIS — Z5189 Encounter for other specified aftercare: Secondary | ICD-10-CM | POA: Diagnosis not present

## 2018-12-19 NOTE — Telephone Encounter (Signed)
Completed application faxed to [facility].  Kristy [name] has been approved until the end of the calendar year.  Patient notified of approval via telephone.

## 2018-12-27 ENCOUNTER — Telehealth: Payer: Self-pay | Admitting: Family Medicine

## 2018-12-27 MED ORDER — INSULIN REGULAR HUMAN (CONC) 500 UNIT/ML ~~LOC~~ SOPN: 100.0000 [IU] | PEN_INJECTOR | Freq: Three times a day (TID) | SUBCUTANEOUS | 3 refills | Status: DC

## 2018-12-27 NOTE — Addendum Note (Signed)
Addended by: [name] on: 12/27/2018 03:09 PM   Modules accepted: Orders

## 2018-12-27 NOTE — Telephone Encounter (Signed)
Left message for Kristy [name] to return call and verify if she needs a Rx sent to [facility] for Humulin R U-500.

## 2018-12-27 NOTE — Telephone Encounter (Signed)
Pt stated she wanted her medications sent to her house.

## 2018-12-27 NOTE — Telephone Encounter (Signed)
Pt want medications sent to her home

## 2018-12-27 NOTE — Telephone Encounter (Signed)
[facility] fills order for [facility].  Rx for Humulin R sent electronically.  They will contact patient to set up delivery.

## 2018-12-27 NOTE — Telephone Encounter (Signed)
Kristy [name] is calling for a new rx for Humulin RU 500.  E-scribe to [facility] in [place] or fax number [phone].

## 2019-01-01 NOTE — Telephone Encounter (Signed)
Received from [employer] [facility]:  Humulin R U-500 Lot [id number] C Exp: 01/2021 9 boxes with 2 pens in each.  I left a message for Kristy [name] that her insulin is here at the office for her to pick up.

## 2019-01-16 ENCOUNTER — Other Ambulatory Visit: Payer: Self-pay

## 2019-01-16 ENCOUNTER — Telehealth: Payer: Self-pay | Admitting: Cardiovascular Disease

## 2019-01-16 MED ORDER — SACUBITRIL-VALSARTAN 49-51 MG PO TABS: ORAL_TABLET | ORAL | 0 refills | Status: DC

## 2019-01-16 NOTE — Telephone Encounter (Signed)
°*  STAT* If patient is at the pharmacy, call can be transferred to refill team.   1. Which medications need to be refilled? (please list name of each medication and dose if known) Entresto 49-51 mg po BID  2. Which pharmacy/location (including street and city if local pharmacy) is medication to be sent to? [facility]   3. Do they need a 30 day or 90 day supply? [place]

## 2019-01-16 NOTE — Telephone Encounter (Signed)
sacubitril-valsartan (ENTRESTO) 49-51 MG 180 tablet 0 01/16/2019    Sig: Take 1 tablet (49/51 mg) by mouth 2 times daily starting on Sunday 09/15/18   Sent to pharmacy as: sacubitril-valsartan (ENTRESTO) 49-51 MG   E-Prescribing Status: Receipt confirmed by pharmacy (01/16/2019 11:35 AM EDT)   [place], [place]

## 2019-01-23 ENCOUNTER — Other Ambulatory Visit: Payer: Self-pay

## 2019-01-23 MED ORDER — IVABRADINE HCL 5 MG PO TABS: 5.0000 mg | ORAL_TABLET | Freq: Two times a day (BID) | ORAL | 2 refills | Status: DC

## 2019-01-23 NOTE — Telephone Encounter (Signed)
Refill sent for Corlanor 5 mg

## 2019-02-04 DIAGNOSIS — L578 Other skin changes due to chronic exposure to nonionizing radiation: Secondary | ICD-10-CM | POA: Diagnosis not present

## 2019-02-04 DIAGNOSIS — L820 Inflamed seborrheic keratosis: Secondary | ICD-10-CM | POA: Diagnosis not present

## 2019-02-04 DIAGNOSIS — L821 Other seborrheic keratosis: Secondary | ICD-10-CM | POA: Diagnosis not present

## 2019-02-10 DIAGNOSIS — R69 Illness, unspecified: Secondary | ICD-10-CM | POA: Diagnosis not present

## 2019-02-11 ENCOUNTER — Other Ambulatory Visit: Payer: Self-pay | Admitting: Family Medicine

## 2019-02-11 NOTE — Telephone Encounter (Signed)
Please schedule virtual visit with Dr. Lorelei Pont to follow up on Depression.

## 2019-02-12 ENCOUNTER — Encounter: Payer: Self-pay | Admitting: Family Medicine

## 2019-02-12 ENCOUNTER — Ambulatory Visit (INDEPENDENT_AMBULATORY_CARE_PROVIDER_SITE_OTHER): Payer: Medicare HMO | Admitting: Family Medicine

## 2019-02-12 VITALS — BP 177/109 | HR 95 | Ht 62.75 in | Wt 154.0 lb

## 2019-02-12 DIAGNOSIS — I5022 Chronic systolic (congestive) heart failure: Secondary | ICD-10-CM

## 2019-02-12 DIAGNOSIS — F3341 Major depressive disorder, recurrent, in partial remission: Secondary | ICD-10-CM | POA: Diagnosis not present

## 2019-02-12 DIAGNOSIS — R69 Illness, unspecified: Secondary | ICD-10-CM | POA: Diagnosis not present

## 2019-02-12 DIAGNOSIS — Z794 Long term (current) use of insulin: Secondary | ICD-10-CM

## 2019-02-12 DIAGNOSIS — E119 Type 2 diabetes mellitus without complications: Secondary | ICD-10-CM

## 2019-02-12 MED ORDER — FLUOXETINE HCL 20 MG PO CAPS: 20.0000 mg | ORAL_CAPSULE | Freq: Every day | ORAL | 3 refills | Status: DC

## 2019-02-12 NOTE — Telephone Encounter (Signed)
Left message asking pt to call office  °

## 2019-02-12 NOTE — Progress Notes (Signed)
Kristy [name] T. Lively Haberman, MD [facility] and [place] at [facility] [place], [address] Phone: [phone]  FAX: [place] - 68 y.o. female  MRN [id number]  Date of Birth: [birth year]  Visit Date: 02/12/2019  PCP: Owens Loffler, MD  Referred by: Owens Loffler, MD  Virtual Visit via Telephone Note:  I connected with  Kristy [name] on 02/12/2019 11:20 AM EDT by telephone and verified that I am speaking with the correct person using two identifiers.   Location patient: home phone or cell phone Location provider: work or home office Consent: Verbal consent directly obtained from Kristy [name] and that there may be a patient responsible charge related to this service. Persons participating in the virtual visit: patient, provider  I discussed the limitations of evaluation and management by telemedicine and the availability of in person appointments.  The patient expressed understanding and agreed to proceed.     History of Present Illness:  We talked about a number of things today.  Generally her diabetes is still extremely out-of-control.  Her A1c is greater than 14 despite extremely high doses of insulin.  She is now followed by the [facility] subspecialty diabetes clinic.  They have been making some changes and want to make some changes in the future.  Her depression worsened of late and she had stopped her Prozac, and she feels better after restarting in a couple weeks ago.  We also talked about her congestive heart failure, her recent ejection fraction was 50% after being 15 to 20% earlier.  She had some what sounds like misunderstanding about her heart.  She thought that part of her heart wall was dead and that she was likely going to need a pacemaker.  F/u HM in october   Review of Systems: pertinent positives and pertinent negatives as per HPI No acute distress verbally  Past Medical History,  Surgical History, Social History, Family History, Problem List, Medications, and Allergies have been reviewed and updated if relevant.   Observations/Objective/Exam:  An attempt was made to discern vital signs over the phone and per patient if applicable and possible.   Pulmonary:     Effort: Pulmonary effort is normal. No respiratory distress.  Neurological:     Mental Status: He is alert and oriented to person, place, and time.  Psychiatric:        Thought Content: Thought content normal.        Judgment: Judgment normal.   Assessment and Plan:  Major depressive disorder, recurrent episode, in partial remission ([place])  Type 2 diabetes mellitus without complication, with long-term current use of insulin (HCC)  Chronic systolic heart failure (HCC)  >25 minutes spent in face to face time with patient, >50% spent in counselling or coordination of care: long discussion as above. Dep is stable reviewed prior CV studies. Reviewed [facility] records.   I asked her to talk about her cardiac conditions with Dr. Rockey Situ next time she is in his office and specifically to see if he can review her echocardiogram with her and possibly visualize it.  I discussed the assessment and treatment plan with the patient. The patient was provided an opportunity to ask questions and all were answered. The patient agreed with the plan and demonstrated an understanding of the instructions.   The patient was advised to call back or seek an in-person evaluation if the symptoms worsen or if the condition fails to improve as  anticipated.  Follow-up: 07/2018 for AMV  Meds ordered this encounter  Medications  . FLUoxetine (PROZAC) 20 MG capsule    Sig: Take 1 capsule (20 mg total) by mouth daily.    Dispense:  90 capsule    Refill:  3   Orders Placed This Encounter  Procedures  . Hemoglobin A1c    Signed,  Gawain Crombie T. Dalaya Suppa, MD

## 2019-02-14 NOTE — Progress Notes (Signed)
Medicare wellness 11/19 cpx 11/23 Pt aware

## 2019-02-14 NOTE — Telephone Encounter (Signed)
Appointment 5/13

## 2019-02-17 ENCOUNTER — Telehealth: Payer: Self-pay | Admitting: Cardiovascular Disease

## 2019-02-17 NOTE — Telephone Encounter (Signed)
Pt c/o BP issue: STAT if pt c/o blurred vision, one-sided weakness or slurred speech  1. What are your last 5 BP readings? 177/110  2. Are you having any other symptoms (ex. Dizziness, headache, blurred vision, passed out)? No  3. What is your BP issue? Last week patient noticed that BP was high.  Has been high since.  Patient at dentist and does not have readings with them at this time but does have some at home.  Please call to discuss.

## 2019-02-17 NOTE — Telephone Encounter (Signed)
Call to patient, she reports e visit with PCP last week. Was instructed to take VS. BP was elevated at that time which caused her to keep a check on it.   She reports that it has been elevated since. BP this am 2 hrs after med 177/110, HR 87. She takes BP med 2 x daily and has been taking BP as well. She reports that BP has been consistently around 177/110.  Pt denies headache or vision changes. Denies increase of salt in diet.  She does have chest (feels more like back) pain in the evenings when laying down. "If I move around, it stops".     Routed to provider to further advise.

## 2019-02-18 NOTE — Telephone Encounter (Signed)
Can we verify her medications, coreg? entretso?

## 2019-02-19 NOTE — Telephone Encounter (Signed)
Spoke with patient and she confirmed taking carvedilol 6.25 mg twice a day with Entresto 49/51 mg twice a day as well. Reviewed measures to take when monitoring blood pressures. Requested that she try these measures tonight and in the morning to see if there are any changes and that I will then send over to provider for his review. She verbalized understanding, agreement with plan, and had no further questions at this time.

## 2019-02-19 NOTE — Telephone Encounter (Signed)
Spoke with patient regarding her blood pressures

## 2019-02-20 NOTE — Telephone Encounter (Signed)
Perfect Pam Your instructions need to be part of every nurses triage call before we start changing medications Good example of when we get more information we do not make mistakes by overmedicating people

## 2019-02-20 NOTE — Telephone Encounter (Signed)
Spoke with patient and reviewed her blood pressure readings since our talk last night about sitting, no talking, and no crossed legs. She states that last night her blood pressure was 137/84. This morning before her medications she was 158/99 and 2 hours after medications it was 137/78 and heart rate was 80. Instructed her to continue monitoring and call us if it persists greater than 140/90. She was appreciative for the call with no further questions at this time.

## 2019-04-19 ENCOUNTER — Emergency Department: Payer: Medicare HMO

## 2019-04-19 ENCOUNTER — Encounter: Payer: Self-pay | Admitting: Emergency Medicine

## 2019-04-19 ENCOUNTER — Inpatient Hospital Stay
Admission: EM | Admit: 2019-04-19 | Discharge: 2019-04-28 | DRG: 286 | Disposition: A | Discharge status: Home Health | Payer: Medicare HMO | Attending: Internal Medicine | Admitting: Internal Medicine

## 2019-04-19 DIAGNOSIS — N3592 Unspecified urethral stricture, female: Secondary | ICD-10-CM | POA: Diagnosis present

## 2019-04-19 DIAGNOSIS — E0811 Diabetes mellitus due to underlying condition with ketoacidosis with coma: Secondary | ICD-10-CM | POA: Diagnosis not present

## 2019-04-19 DIAGNOSIS — K219 Gastro-esophageal reflux disease without esophagitis: Secondary | ICD-10-CM | POA: Diagnosis present

## 2019-04-19 DIAGNOSIS — G9341 Metabolic encephalopathy: Secondary | ICD-10-CM | POA: Diagnosis not present

## 2019-04-19 DIAGNOSIS — I5033 Acute on chronic diastolic (congestive) heart failure: Secondary | ICD-10-CM | POA: Diagnosis not present

## 2019-04-19 DIAGNOSIS — J9690 Respiratory failure, unspecified, unspecified whether with hypoxia or hypercapnia: Secondary | ICD-10-CM | POA: Diagnosis not present

## 2019-04-19 DIAGNOSIS — R4020 Unspecified coma: Secondary | ICD-10-CM | POA: Diagnosis not present

## 2019-04-19 DIAGNOSIS — I447 Left bundle-branch block, unspecified: Secondary | ICD-10-CM | POA: Diagnosis present

## 2019-04-19 DIAGNOSIS — R000 Tachycardia, unspecified: Secondary | ICD-10-CM | POA: Diagnosis present

## 2019-04-19 DIAGNOSIS — I25118 Atherosclerotic heart disease of native coronary artery with other forms of angina pectoris: Secondary | ICD-10-CM | POA: Diagnosis not present

## 2019-04-19 DIAGNOSIS — N179 Acute kidney failure, unspecified: Secondary | ICD-10-CM | POA: Diagnosis not present

## 2019-04-19 DIAGNOSIS — K5090 Crohn's disease, unspecified, without complications: Secondary | ICD-10-CM | POA: Diagnosis not present

## 2019-04-19 DIAGNOSIS — E876 Hypokalemia: Secondary | ICD-10-CM | POA: Diagnosis not present

## 2019-04-19 DIAGNOSIS — A419 Sepsis, unspecified organism: Secondary | ICD-10-CM

## 2019-04-19 DIAGNOSIS — J9600 Acute respiratory failure, unspecified whether with hypoxia or hypercapnia: Secondary | ICD-10-CM | POA: Diagnosis not present

## 2019-04-19 DIAGNOSIS — Z87891 Personal history of nicotine dependence: Secondary | ICD-10-CM | POA: Diagnosis not present

## 2019-04-19 DIAGNOSIS — I4891 Unspecified atrial fibrillation: Secondary | ICD-10-CM | POA: Diagnosis not present

## 2019-04-19 DIAGNOSIS — I110 Hypertensive heart disease with heart failure: Secondary | ICD-10-CM | POA: Diagnosis not present

## 2019-04-19 DIAGNOSIS — Z8249 Family history of ischemic heart disease and other diseases of the circulatory system: Secondary | ICD-10-CM | POA: Diagnosis not present

## 2019-04-19 DIAGNOSIS — E873 Alkalosis: Secondary | ICD-10-CM | POA: Diagnosis not present

## 2019-04-19 DIAGNOSIS — D696 Thrombocytopenia, unspecified: Secondary | ICD-10-CM | POA: Diagnosis present

## 2019-04-19 DIAGNOSIS — E1165 Type 2 diabetes mellitus with hyperglycemia: Secondary | ICD-10-CM | POA: Diagnosis not present

## 2019-04-19 DIAGNOSIS — I472 Ventricular tachycardia: Secondary | ICD-10-CM | POA: Diagnosis not present

## 2019-04-19 DIAGNOSIS — R488 Other symbolic dysfunctions: Secondary | ICD-10-CM | POA: Diagnosis not present

## 2019-04-19 DIAGNOSIS — I5023 Acute on chronic systolic (congestive) heart failure: Secondary | ICD-10-CM | POA: Diagnosis not present

## 2019-04-19 DIAGNOSIS — R296 Repeated falls: Secondary | ICD-10-CM | POA: Diagnosis present

## 2019-04-19 DIAGNOSIS — I428 Other cardiomyopathies: Secondary | ICD-10-CM | POA: Diagnosis not present

## 2019-04-19 DIAGNOSIS — G9340 Encephalopathy, unspecified: Secondary | ICD-10-CM | POA: Diagnosis not present

## 2019-04-19 DIAGNOSIS — J9602 Acute respiratory failure with hypercapnia: Secondary | ICD-10-CM | POA: Diagnosis not present

## 2019-04-19 DIAGNOSIS — R404 Transient alteration of awareness: Secondary | ICD-10-CM | POA: Diagnosis not present

## 2019-04-19 DIAGNOSIS — I2510 Atherosclerotic heart disease of native coronary artery without angina pectoris: Secondary | ICD-10-CM | POA: Diagnosis present

## 2019-04-19 DIAGNOSIS — I5022 Chronic systolic (congestive) heart failure: Secondary | ICD-10-CM

## 2019-04-19 DIAGNOSIS — Z9119 Patient's noncompliance with other medical treatment and regimen: Secondary | ICD-10-CM

## 2019-04-19 DIAGNOSIS — R338 Other retention of urine: Secondary | ICD-10-CM | POA: Diagnosis present

## 2019-04-19 DIAGNOSIS — I248 Other forms of acute ischemic heart disease: Secondary | ICD-10-CM | POA: Diagnosis not present

## 2019-04-19 DIAGNOSIS — Z20828 Contact with and (suspected) exposure to other viral communicable diseases: Secondary | ICD-10-CM | POA: Diagnosis present

## 2019-04-19 DIAGNOSIS — R4182 Altered mental status, unspecified: Secondary | ICD-10-CM | POA: Diagnosis not present

## 2019-04-19 DIAGNOSIS — N390 Urinary tract infection, site not specified: Secondary | ICD-10-CM

## 2019-04-19 DIAGNOSIS — E1310 Other specified diabetes mellitus with ketoacidosis without coma: Secondary | ICD-10-CM | POA: Diagnosis not present

## 2019-04-19 DIAGNOSIS — R7989 Other specified abnormal findings of blood chemistry: Secondary | ICD-10-CM | POA: Diagnosis not present

## 2019-04-19 DIAGNOSIS — J189 Pneumonia, unspecified organism: Secondary | ICD-10-CM | POA: Diagnosis present

## 2019-04-19 DIAGNOSIS — Z9114 Patient's other noncompliance with medication regimen: Secondary | ICD-10-CM | POA: Diagnosis not present

## 2019-04-19 DIAGNOSIS — E785 Hyperlipidemia, unspecified: Secondary | ICD-10-CM | POA: Diagnosis not present

## 2019-04-19 DIAGNOSIS — E1110 Type 2 diabetes mellitus with ketoacidosis without coma: Secondary | ICD-10-CM | POA: Diagnosis present

## 2019-04-19 DIAGNOSIS — I420 Dilated cardiomyopathy: Secondary | ICD-10-CM | POA: Diagnosis not present

## 2019-04-19 DIAGNOSIS — M542 Cervicalgia: Secondary | ICD-10-CM | POA: Diagnosis not present

## 2019-04-19 DIAGNOSIS — J9601 Acute respiratory failure with hypoxia: Secondary | ICD-10-CM | POA: Diagnosis present

## 2019-04-19 DIAGNOSIS — R778 Other specified abnormalities of plasma proteins: Secondary | ICD-10-CM

## 2019-04-19 DIAGNOSIS — S0990XA Unspecified injury of head, initial encounter: Secondary | ICD-10-CM | POA: Diagnosis not present

## 2019-04-19 DIAGNOSIS — Z794 Long term (current) use of insulin: Secondary | ICD-10-CM

## 2019-04-19 DIAGNOSIS — I10 Essential (primary) hypertension: Secondary | ICD-10-CM | POA: Diagnosis not present

## 2019-04-19 DIAGNOSIS — E0810 Diabetes mellitus due to underlying condition with ketoacidosis without coma: Secondary | ICD-10-CM | POA: Diagnosis not present

## 2019-04-19 LAB — BLOOD GAS, VENOUS
Acid-base deficit: 21.9 mmol/L — ABNORMAL HIGH (ref 0.0–2.0)
Bicarbonate: 4.8 mmol/L — ABNORMAL LOW (ref 20.0–28.0)
O2 Saturation: 56 %
Patient temperature: 37
pCO2, Ven: <19 mmHg — CL (ref 44.0–60.0)
pH, Ven: 7.14 — CL (ref 7.250–7.430)
pO2, Ven: 40 mmHg (ref 32.0–45.0)

## 2019-04-19 LAB — CBC WITH DIFFERENTIAL/PLATELET
Abs Immature Granulocytes: 0.22 10*3/uL — ABNORMAL HIGH (ref 0.00–0.07)
Basophils Absolute: 0.1 10*3/uL (ref 0.0–0.1)
Basophils Relative: 0 %
Eosinophils Absolute: 0 10*3/uL (ref 0.0–0.5)
Eosinophils Relative: 0 %
HCT: 53 % — ABNORMAL HIGH (ref 36.0–46.0)
Hemoglobin: 16.1 g/dL — ABNORMAL HIGH (ref 12.0–15.0)
Immature Granulocytes: 1 %
Lymphocytes Relative: 11 %
Lymphs Abs: 2.8 10*3/uL (ref 0.7–4.0)
MCH: 29.7 pg (ref 26.0–34.0)
MCHC: 30.4 g/dL (ref 30.0–36.0)
MCV: 97.8 fL (ref 80.0–100.0)
Monocytes Absolute: 2.5 10*3/uL — ABNORMAL HIGH (ref 0.1–1.0)
Monocytes Relative: 10 %
Neutro Abs: 20.3 10*3/uL — ABNORMAL HIGH (ref 1.7–7.7)
Neutrophils Relative %: 78 %
Platelets: 266 10*3/uL (ref 150–400)
RBC: 5.42 MIL/uL — ABNORMAL HIGH (ref 3.87–5.11)
RDW: 14 % (ref 11.5–15.5)
Smear Review: NORMAL
WBC: 25.9 10*3/uL — ABNORMAL HIGH (ref 4.0–10.5)
nRBC: 0 % (ref 0.0–0.2)

## 2019-04-19 LAB — COMPREHENSIVE METABOLIC PANEL
ALT: 26 U/L (ref 0–44)
AST: 45 U/L — ABNORMAL HIGH (ref 15–41)
Albumin: 3.9 g/dL (ref 3.5–5.0)
Alkaline Phosphatase: 146 U/L — ABNORMAL HIGH (ref 38–126)
Anion gap: 32 — ABNORMAL HIGH (ref 5–15)
BUN: 34 mg/dL — ABNORMAL HIGH (ref 8–23)
CO2: 12 mmol/L — ABNORMAL LOW (ref 22–32)
Calcium: 9.7 mg/dL (ref 8.9–10.3)
Chloride: 86 mmol/L — ABNORMAL LOW (ref 98–111)
Creatinine, Ser: 2.26 mg/dL — ABNORMAL HIGH (ref 0.44–1.00)
GFR calc Af Amer: 25 mL/min — ABNORMAL LOW (ref >60)
GFR calc non Af Amer: 22 mL/min — ABNORMAL LOW (ref >60)
Glucose, Bld: 1161 mg/dL (ref 70–99)
Potassium: 5 mmol/L (ref 3.5–5.1)
Sodium: 130 mmol/L — ABNORMAL LOW (ref 135–145)
Total Bilirubin: 1.9 mg/dL — ABNORMAL HIGH (ref 0.3–1.2)
Total Protein: 7.7 g/dL (ref 6.5–8.1)

## 2019-04-19 LAB — CK: Total CK: 190 U/L (ref 38–234)

## 2019-04-19 LAB — TSH: TSH: 0.586 u[IU]/mL (ref 0.350–4.500)

## 2019-04-19 LAB — GLUCOSE, CAPILLARY
Glucose-Capillary: >600 mg/dL (ref 70–99)
Glucose-Capillary: >600 mg/dL (ref 70–99)

## 2019-04-19 LAB — LACTIC ACID, PLASMA: Lactic Acid, Venous: <0.3 mmol/L — ABNORMAL LOW (ref 0.5–1.9)

## 2019-04-19 LAB — ETHANOL: Alcohol, Ethyl (B): <10 mg/dL (ref <10)

## 2019-04-19 LAB — SALICYLATE LEVEL: Salicylate Lvl: <7 mg/dL (ref 2.8–30.0)

## 2019-04-19 LAB — ACETAMINOPHEN LEVEL: Acetaminophen (Tylenol), Serum: <10 ug/mL — ABNORMAL LOW (ref 10–30)

## 2019-04-19 LAB — BETA-HYDROXYBUTYRIC ACID: Beta-Hydroxybutyric Acid: >8 mmol/L — ABNORMAL HIGH (ref 0.05–0.27)

## 2019-04-19 LAB — SARS CORONAVIRUS 2 BY RT PCR (HOSPITAL ORDER, PERFORMED IN ~~LOC~~ HOSPITAL LAB): SARS Coronavirus 2: NEGATIVE

## 2019-04-19 LAB — PROTIME-INR
INR: 1.2 (ref 0.8–1.2)
Prothrombin Time: 14.8 seconds (ref 11.4–15.2)

## 2019-04-19 LAB — APTT: aPTT: 31 seconds (ref 24–36)

## 2019-04-19 LAB — OSMOLALITY: Osmolality: 372 mOsm/kg (ref 275–295)

## 2019-04-19 LAB — T4, FREE: Free T4: 1.04 ng/dL (ref 0.61–1.12)

## 2019-04-19 MED ORDER — LORAZEPAM 2 MG/ML IJ SOLN: 1.0000 mg | Freq: Once | INTRAMUSCULAR | Status: DC

## 2019-04-19 MED ORDER — ACETAMINOPHEN 650 MG RE SUPP: 650.0000 mg | Freq: Four times a day (QID) | RECTAL | Status: DC | PRN

## 2019-04-19 MED ORDER — VANCOMYCIN HCL IN DEXTROSE 1-5 GM/200ML-% IV SOLN
1000.0000 mg | Freq: Once | INTRAVENOUS | Status: AC
  Administered 2019-04-19: 1000 mg via INTRAVENOUS
  Filled 2019-04-19: qty 200

## 2019-04-19 MED ORDER — SODIUM CHLORIDE 0.9 % IV SOLN
2.0000 g | Freq: Once | INTRAVENOUS | Status: AC
  Administered 2019-04-19: 2 g via INTRAVENOUS
  Filled 2019-04-19: qty 2

## 2019-04-19 MED ORDER — VANCOMYCIN HCL 500 MG IV SOLR
500.0000 mg | Freq: Once | INTRAVENOUS | Status: AC
  Administered 2019-04-19: 500 mg via INTRAVENOUS
  Filled 2019-04-19: qty 500

## 2019-04-19 MED ORDER — LORAZEPAM 2 MG/ML IJ SOLN
INTRAMUSCULAR | Status: AC
  Administered 2019-04-19: 1 mg
  Filled 2019-04-19: qty 1

## 2019-04-19 MED ORDER — METOPROLOL TARTRATE 5 MG/5ML IV SOLN
5.0000 mg | INTRAVENOUS | Status: DC | PRN
  Filled 2019-04-19 (×2): qty 5

## 2019-04-19 MED ORDER — ONDANSETRON HCL 4 MG PO TABS: 4.0000 mg | ORAL_TABLET | Freq: Four times a day (QID) | ORAL | Status: DC | PRN

## 2019-04-19 MED ORDER — VANCOMYCIN HCL 500 MG IV SOLR
500.0000 mg | Freq: Once | INTRAVENOUS | Status: DC
  Filled 2019-04-19: qty 500

## 2019-04-19 MED ORDER — METRONIDAZOLE IN NACL 5-0.79 MG/ML-% IV SOLN
500.0000 mg | Freq: Three times a day (TID) | INTRAVENOUS | Status: DC
  Administered 2019-04-20: 04:00:00 500 mg via INTRAVENOUS
  Filled 2019-04-19 (×3): qty 100

## 2019-04-19 MED ORDER — ONDANSETRON HCL 4 MG/2ML IJ SOLN: 4.0000 mg | Freq: Four times a day (QID) | INTRAMUSCULAR | Status: DC | PRN

## 2019-04-19 MED ORDER — SODIUM CHLORIDE 0.9 % IV SOLN
INTRAVENOUS | Status: DC
  Administered 2019-04-20: via INTRAVENOUS

## 2019-04-19 MED ORDER — ACETAMINOPHEN 650 MG RE SUPP
650.0000 mg | Freq: Once | RECTAL | Status: AC
  Administered 2019-04-19: 650 mg via RECTAL
  Filled 2019-04-19: qty 1

## 2019-04-19 MED ORDER — SODIUM CHLORIDE 0.9 % IV SOLN
2.0000 g | INTRAVENOUS | Status: DC
  Filled 2019-04-19: qty 2

## 2019-04-19 MED ORDER — ACETAMINOPHEN 325 MG PO TABS: 650.0000 mg | ORAL_TABLET | Freq: Four times a day (QID) | ORAL | Status: DC | PRN

## 2019-04-19 MED ORDER — METRONIDAZOLE IN NACL 5-0.79 MG/ML-% IV SOLN
500.0000 mg | Freq: Once | INTRAVENOUS | Status: AC
  Administered 2019-04-19: 500 mg via INTRAVENOUS
  Filled 2019-04-19: qty 100

## 2019-04-19 MED ORDER — ENOXAPARIN SODIUM 40 MG/0.4ML ~~LOC~~ SOLN
40.0000 mg | SUBCUTANEOUS | Status: DC
  Administered 2019-04-20: 01:00:00 40 mg via SUBCUTANEOUS
  Filled 2019-04-19: qty 0.4

## 2019-04-19 MED ORDER — INSULIN REGULAR(HUMAN) IN NACL 100-0.9 UT/100ML-% IV SOLN
INTRAVENOUS | Status: AC
  Administered 2019-04-19: 10.8 [IU]/h via INTRAVENOUS
  Administered 2019-04-20: 16.5 [IU]/h via INTRAVENOUS
  Administered 2019-04-20: 5 [IU]/h via INTRAVENOUS
  Filled 2019-04-19 (×3): qty 100

## 2019-04-19 MED ORDER — SODIUM CHLORIDE 0.9 % IV BOLUS
1000.0000 mL | Freq: Once | INTRAVENOUS | Status: AC
  Administered 2019-04-19: 1000 mL via INTRAVENOUS

## 2019-04-19 NOTE — ED Notes (Signed)
ED TO INPATIENT HANDOFF REPORT  ED Nurse Name and Phone #:  [name] [phone]  S Name/Age/Gender Kristy [name] 68 y.o. female Room/Bed: ED16A/ED16A  Code Status   Code Status: Full Code  Home/SNF/Other Home {Patient oriented to:22149:::1  Is this baseline? No   Triage Complete: Triage complete  Chief Complaint unresponvie  Triage Note Pt to ED by EMS from home unresponsive. Pt's daughter called EMS because unable to get in touch with patient today.Pt responds to pain.    Allergies Allergies  Allergen Reactions  . Fish Oil Other (See Comments)    Gout  . Glimepiride     REACTION: hypoglycemia  . Guanfacine Hcl     REACTION: unspecified  . Rosiglitazone Other (See Comments)    CHF    Level of Care/Admitting Diagnosis ED Disposition    ED Disposition Condition [facility] [facility]: [place] [[id number]]  Level of Care: Telemetry [5]  Covid Evaluation: Confirmed COVID Negative  Diagnosis: Pneumonia [[id number]]  Admitting Physician: Loletha Grayer [[id number]]  Attending Physician: Loletha Grayer [phone]  Estimated length of stay: past midnight tomorrow  Certification:: I certify this patient will need inpatient services for at least 2 midnights  PT Class (Do Not Modify): Inpatient [101]  PT Acc Code (Do Not Modify): Private [1]       B Medical/Surgery History Past Medical History:  Diagnosis Date  . Allergic rhinitis   . Allergy   . Cataract    mild   . Crohn's disease ([place]) 10/13/2013  . Diverticulosis   . GERD (gastroesophageal reflux disease)   . Gout   . HFrEF (heart failure with reduced ejection fraction) ([place])    a. 12/2008 Cath: EF 45% w/ inf HK; b. 07/2009 Echo: EF 50-55%; c. 08/2018 Echo: EF 20-25%.  Marland Kitchen Hyperlipidemia   . Hypertension   . IBS (irritable bowel syndrome)   . Left bundle branch block   . Neuromuscular disorder (H[facility])    neuropathy  . NICM (nonischemic cardiomyopathy) ([place])    a. 12/2008 Cath: no  significant dzs, EF 45% w/ inf HK->Med Rx; b. 07/2009 Echo: EF 50-55%; c. 08/2018 Echo: EF 20-25%, ant/antsept HK, mild MR, mildly dil LA, nl RV fx; d. 08/2018 Cath: D1 80, otw nonobs dzs->Med rx.  . Non-obstructive CAD (coronary artery disease)    a. 12/2008 Cath: no significant dzs, EF 45% w/ inf HK->Med Rx; b. 08/2018 Cath: LM nl, LAD min irregs, D1 80, LCX 38md, RCA min irregs->Med Rx.  . Osteoarthritis   . Poorly controlled Diabetes mellitus    a. 07/2018 A1c 13.1.  .Marland KitchenSymptomatic cholelithiasis    Past Surgical History:  Procedure Laterality Date  . COLONOSCOPY    . DILATION AND CURETTAGE OF UTERUS    . RIGHT/LEFT HEART CATH AND CORONARY ANGIOGRAPHY N/A 08/26/2018   Procedure: RIGHT/LEFT HEART CATH AND CORONARY ANGIOGRAPHY;  Surgeon: AWellington Hampshire MD;  [facility];  Service: Cardiovascular;  Laterality: N/A;  . SHOULDER SURGERY     15 + yrs ago   . SKIN SURGERY     nose   . VAGINAL HYSTERECTOMY       A IV Location/Drains/Wounds Patient Lines/Drains/Airways Status   Active Line/Drains/Airways    Name:   Placement date:   Placement time:   Site:   Days:   Peripheral IV 04/19/19 Anterior;Left;Proximal Antecubital   04/19/19    -    Antecubital   less than 1   Peripheral IV  04/19/19 Right Hand   04/19/19    1907    Hand   less than 1          Intake/Output Last 24 hours  Intake/Output Summary (Last 24 hours) at 04/19/2019 2311 Last data filed at 04/19/2019 2147 Gross per 24 hour  Intake 200 ml  Output -  Net 200 ml    Labs/Imaging Results for orders placed or performed during the hospital encounter of 04/19/19 (from the past 48 hour(s))  Blood gas, venous     Status: Abnormal   Collection Time: 04/19/19  6:46 PM  Result Value Ref Range   pH, Ven 7.14 (LL) 7.250 - 7.430    Comment: CRITICAL RESULT CALLED TO, READ BACK BY AND VERIFIED WITH: KIM RN [id number] 2012 DT    pCO2, Ven <19.0 (LL) 44.0 - 60.0 mmHg    Comment: CRITICAL RESULT CALLED TO,  READ BACK BY AND VERIFIED WITH: KIM RN [id number] 2012 DT    pO2, Ven 40.0 32.0 - 45.0 mmHg   Bicarbonate 4.8 (L) 20.0 - 28.0 mmol/L   Acid-base deficit 21.9 (H) 0.0 - 2.0 mmol/L   O2 Saturation 56.0 %   Patient temperature 37.0    Collection site LINE    Sample type VENOUS     Comment: Performed at [facility], [address] [place], [place] [address]  CBC with Differential     Status: Abnormal   Collection Time: 04/19/19  6:54 PM  Result Value Ref Range   WBC 25.9 (H) 4.0 - 10.5 K/uL   RBC 5.42 (H) 3.87 - 5.11 MIL/uL   Hemoglobin 16.1 (H) 12.0 - 15.0 g/dL   HCT 53.0 (H) 36.0 - 46.0 %   MCV 97.8 80.0 - 100.0 fL   MCH 29.7 26.0 - 34.0 pg   MCHC 30.4 30.0 - 36.0 g/dL   RDW 14.0 11.5 - 15.5 %   Platelets 266 150 - 400 K/uL   nRBC 0.0 0.0 - 0.2 %   Neutrophils Relative % 78 %   Neutro Abs 20.3 (H) 1.7 - 7.7 K/uL   Lymphocytes Relative 11 %   Lymphs Abs 2.8 0.7 - 4.0 K/uL   Monocytes Relative 10 %   Monocytes Absolute 2.5 (H) 0.1 - 1.0 K/uL   Eosinophils Relative 0 %   Eosinophils Absolute 0.0 0.0 - 0.5 K/uL   Basophils Relative 0 %   Basophils Absolute 0.1 0.0 - 0.1 K/uL   WBC Morphology MORPHOLOGY UNREMARKABLE    RBC Morphology MORPHOLOGY UNREMARKABLE    Smear Review Normal platelet morphology    Immature Granulocytes 1 %   Abs Immature Granulocytes 0.22 (H) 0.00 - 0.07 K/uL    Comment: Performed at [facility], [address] [place], [place] [address]  Comprehensive metabolic panel     Status: Abnormal   Collection Time: 04/19/19  6:54 PM  Result Value Ref Range   Sodium 130 (L) 135 - 145 mmol/L    Comment: REPEATED ELECTROLYTES SRC   Potassium 5.0 3.5 - 5.1 mmol/L   Chloride 86 (L) 98 - 111 mmol/L   CO2 12 (L) 22 - 32 mmol/L   Glucose, Bld 1,161 (HH) 70 - 99 mg/dL    Comment: CRITICAL RESULT CALLED TO, READ BACK BY AND VERIFIED WITH LEIGH FERGUSON ON 04/19/19 AT 1951 [facility]    BUN 34 (H) 8 - 23 mg/dL   Creatinine, Ser 2.26 (H) 0.44 - 1.00 mg/dL    Calcium 9.7 8.9 - 10.3 mg/dL  Total Protein 7.7 6.5 - 8.1 g/dL   Albumin 3.9 3.5 - 5.0 g/dL   AST 45 (H) 15 - 41 U/L   ALT 26 0 - 44 U/L   Alkaline Phosphatase 146 (H) 38 - 126 U/L   Total Bilirubin 1.9 (H) 0.3 - 1.2 mg/dL   GFR calc non Af Amer 22 (L) >60 mL/min   GFR calc Af Amer 25 (L) >60 mL/min   Anion gap 32 (H) 5 - 15    Comment: Performed at [facility], [address] [place], [place] [address]  Beta-hydroxybutyric acid     Status: Abnormal   Collection Time: 04/19/19  6:54 PM  Result Value Ref Range   Beta-Hydroxybutyric Acid >8.00 (H) 0.05 - 0.27 mmol/L    Comment: RESULT CONFIRMED BY MANUAL DILUTION / JAG Performed at [facility], [address] [place], [place] [address]   T4, free     Status: None   Collection Time: 04/19/19  6:54 PM  Result Value Ref Range   Free T4 1.04 0.61 - 1.12 ng/dL    Comment: (NOTE) Biotin ingestion may interfere with free T4 tests. If the results are inconsistent with the TSH level, previous test results, or the clinical presentation, then consider biotin interference. If needed, order repeat testing after stopping biotin. Performed at [facility], [address] [place], [place] [address]   TSH     Status: None   Collection Time: 04/19/19  6:54 PM  Result Value Ref Range   TSH 0.586 0.350 - 4.500 uIU/mL    Comment: Performed by a 3rd Generation assay with a functional sensitivity of <=0.01 uIU/mL. Performed at [facility], [address] [place], [place] [address]   CK     Status: None   Collection Time: 04/19/19  6:54 PM  Result Value Ref Range   Total CK 190 38 - 234 U/L    Comment: Performed at [facility], [address] [place], [place] [address]  APTT     Status: None   Collection Time: 04/19/19  6:54 PM  Result Value Ref Range   aPTT 31 24 - 36 seconds    Comment: Performed at [facility], [address] [place], [place] [address]  Protime-INR      Status: None   Collection Time: 04/19/19  6:54 PM  Result Value Ref Range   Prothrombin Time 14.8 11.4 - 15.2 seconds   INR 1.2 0.8 - 1.2    Comment: (NOTE) INR goal varies based on device and disease states. Performed at [facility], [address] [place], [place] [address]   Ethanol     Status: None   Collection Time: 04/19/19  6:54 PM  Result Value Ref Range   Alcohol, Ethyl (B) <10 <10 mg/dL    Comment: (NOTE) Lowest detectable limit for serum alcohol is 10 mg/dL. For medical purposes only. Performed at [facility], [address] [place], [place] [address]   Salicylate level     Status: None   Collection Time: 04/19/19  6:54 PM  Result Value Ref Range   Salicylate Lvl <1.7 2.8 - 30.0 mg/dL    Comment: Performed at [facility], [address] [place], [place] [address]  Acetaminophen level     Status: Abnormal   Collection Time: 04/19/19  6:54 PM  Result Value Ref Range   Acetaminophen (Tylenol), Serum <10 (L) 10 - 30 ug/mL    Comment: (NOTE) Therapeutic  concentrations vary significantly. A range of 10-30 ug/mL  may be an effective concentration for many patients. However, some  are best treated at concentrations outside of this range. Acetaminophen concentrations >150 ug/mL at 4 hours after ingestion  and >50 ug/mL at 12 hours after ingestion are often associated with  toxic reactions. Performed at [facility], [address] [place], [place] [address]   Lactic acid, plasma     Status: Abnormal   Collection Time: 04/19/19  6:54 PM  Result Value Ref Range   Lactic Acid, Venous <0.3 (L) 0.5 - 1.9 mmol/L    Comment: Performed at [facility], [address] [place], [place] [address]  SARS Coronavirus 2 (CEPHEID - Performed in [place] hospital lab), Hosp Order     Status: None   Collection Time: 04/19/19  8:37 PM   Specimen: Nasopharyngeal Swab  Result Value Ref Range   SARS Coronavirus 2 NEGATIVE  NEGATIVE    Comment: (NOTE) If result is NEGATIVE SARS-CoV-2 target nucleic acids are NOT DETECTED. The SARS-CoV-2 RNA is generally detectable in upper and lower  respiratory specimens during the acute phase of infection. The lowest  concentration of SARS-CoV-2 viral copies this assay can detect is 250  copies / mL. A negative result does not preclude SARS-CoV-2 infection  and should not be used as the sole basis for treatment or other  patient management decisions.  A negative result may occur with  improper specimen collection / handling, submission of specimen other  than nasopharyngeal swab, presence of viral mutation(s) within the  areas targeted by this assay, and inadequate number of viral copies  (<250 copies / mL). A negative result must be combined with clinical  observations, patient history, and epidemiological information. If result is POSITIVE SARS-CoV-2 target nucleic acids are DETECTED. The SARS-CoV-2 RNA is generally detectable in upper and lower  respiratory specimens dur ing the acute phase of infection.  Positive  results are indicative of active infection with SARS-CoV-2.  Clinical  correlation with patient history and other diagnostic information is  necessary to determine patient infection status.  Positive results do  not rule out bacterial infection or co-infection with other viruses. If result is PRESUMPTIVE POSTIVE SARS-CoV-2 nucleic acids MAY BE PRESENT.   A presumptive positive result was obtained on the submitted specimen  and confirmed on repeat testing.  While 2019 novel coronavirus  (SARS-CoV-2) nucleic acids may be present in the submitted sample  additional confirmatory testing may be necessary for epidemiological  and / or clinical management purposes  to differentiate between  SARS-CoV-2 and other Sarbecovirus currently known to infect humans.  If clinically indicated additional testing with an alternate test  methodology [phone]) is advised. The  SARS-CoV-2 RNA is generally  detectable in upper and lower respiratory sp ecimens during the acute  phase of infection. The expected result is Negative. Fact Sheet for Patients:  StrictlyIdeas.no Fact Sheet for Healthcare Providers: BankingDealers.co.za This test is not yet approved or cleared by the [place] FDA and has been authorized for detection and/or diagnosis of SARS-CoV-2 by FDA under an Emergency Use Authorization (EUA).  This EUA will remain in effect (meaning this test can be used) for the duration of the COVID-19 declaration under Section 564(b)(1) of the Act, 21 U.S.C. section 360bbb-3(b)(1), unless the authorization is terminated or revoked sooner. Performed at [facility], [address] [place], [place] [address]   Osmolality     Status: Abnormal   Collection Time: 04/19/19  8:37 PM  Result Value Ref Range   Osmolality 372 (HH) 275 - 295 mOsm/kg    Comment: CRITICAL RESULT CALLED TO, READ BACK BY AND VERIFIED WITH: SONJA WEAVER ON 04/19/19 AT 2140 [facility] Performed at [facility], [address] [place], [place] [address]   Glucose, capillary     Status: Abnormal   Collection Time: 04/19/19  8:54 PM  Result Value Ref Range   Glucose-Capillary >600 (HH) 70 - 99 mg/dL  Glucose, capillary     Status: Abnormal   Collection Time: 04/19/19 10:03 PM  Result Value Ref Range   Glucose-Capillary >600 (HH) 70 - 99 mg/dL   Ct Head Wo Contrast  Result Date: 04/19/2019 CLINICAL DATA:  Altered level of consciousness (LOC), unexplained AMS; Neck pain, chronic, abn neuro exam, neg xray AMS. Found unresponsive. EXAM: CT HEAD WITHOUT CONTRAST CT CERVICAL SPINE WITHOUT CONTRAST TECHNIQUE: Multidetector CT imaging of the head and cervical spine was performed following the standard protocol without intravenous contrast. Multiplanar CT image reconstructions of the cervical spine were also generated. COMPARISON:  Head  CT 10/30/2018 FINDINGS: CT HEAD FINDINGS Brain: No intracranial hemorrhage, mass effect, or midline shift. No hydrocephalus. The basilar cisterns are patent. No evidence of territorial infarct or acute ischemia. Unchanged degree of atrophy and mild chronic small vessel ischemia. No extra-axial or intracranial fluid collection. Vascular: No hyperdense vessel. Skull: No fracture or focal lesion. Sinuses/Orbits: Paranasal sinuses and mastoid air cells are clear. The visualized orbits are unremarkable. Other: None. CT CERVICAL SPINE FINDINGS Alignment: Trace anterolisthesis of C4 on C5 is likely facet mediated. Alignment otherwise maintained. Skull base and vertebrae: No acute fracture. Vertebral body heights are maintained. The dens and skull base are intact. Incidental vertebral body hemangioma within C4. Soft tissues and spinal canal: No prevertebral fluid or swelling. No visible canal hematoma. Disc levels: Disc space narrowing and endplate spurring at Y7-W2 and C6-C7. Multilevel facet hypertrophy. No significant spinal canal stenosis. Bilateral neural foraminal stenosis at C5-C6 and right neural foraminal stenosis at C6-C7. Upper chest: 11 mm right thyroid nodule. No dedicated imaging follow-up is needed due to size. Other: None. IMPRESSION: 1. No acute intracranial abnormality. Mild atrophy and chronic small vessel ischemia, similar to prior exam. 2. Multilevel degenerative change in the cervical spine without acute abnormality. Electronically Signed   By: Keith Rake M.D.   On: 04/19/2019 19:41   Ct Cervical Spine Wo Contrast  Result Date: 04/19/2019 CLINICAL DATA:  Altered level of consciousness (LOC), unexplained AMS; Neck pain, chronic, abn neuro exam, neg xray AMS. Found unresponsive. EXAM: CT HEAD WITHOUT CONTRAST CT CERVICAL SPINE WITHOUT CONTRAST TECHNIQUE: Multidetector CT imaging of the head and cervical spine was performed following the standard protocol without intravenous contrast.  Multiplanar CT image reconstructions of the cervical spine were also generated. COMPARISON:  Head CT 10/30/2018 FINDINGS: CT HEAD FINDINGS Brain: No intracranial hemorrhage, mass effect, or midline shift. No hydrocephalus. The basilar cisterns are patent. No evidence of territorial infarct or acute ischemia. Unchanged degree of atrophy and mild chronic small vessel ischemia. No extra-axial or intracranial fluid collection. Vascular: No hyperdense vessel. Skull: No fracture or focal lesion. Sinuses/Orbits: Paranasal sinuses and mastoid air cells are clear. The visualized orbits are unremarkable. Other: None. CT CERVICAL SPINE FINDINGS Alignment: Trace anterolisthesis of C4 on C5 is likely facet mediated. Alignment otherwise maintained. Skull base and vertebrae: No acute fracture. Vertebral body heights are maintained. The dens and skull base are intact. Incidental vertebral body hemangioma within C4. Soft  tissues and spinal canal: No prevertebral fluid or swelling. No visible canal hematoma. Disc levels: Disc space narrowing and endplate spurring at O6-V6 and C6-C7. Multilevel facet hypertrophy. No significant spinal canal stenosis. Bilateral neural foraminal stenosis at C5-C6 and right neural foraminal stenosis at C6-C7. Upper chest: 11 mm right thyroid nodule. No dedicated imaging follow-up is needed due to size. Other: None. IMPRESSION: 1. No acute intracranial abnormality. Mild atrophy and chronic small vessel ischemia, similar to prior exam. 2. Multilevel degenerative change in the cervical spine without acute abnormality. Electronically Signed   By: Keith Rake M.D.   On: 04/19/2019 19:41   Dg Chest Port 1 View  Result Date: 04/19/2019 CLINICAL DATA:  Altered mental status. EXAM: PORTABLE CHEST 1 VIEW COMPARISON:  October 30, 2018 FINDINGS: The heart size and mediastinal contours are within normal limits. Both lungs are clear. The visualized skeletal structures are unremarkable. IMPRESSION: No active  disease. Electronically Signed   By: Constance Holster M.D.   On: 04/19/2019 19:42    Pending Labs Unresulted Labs (From admission, onward)    Start     Ordered   04/26/19 0500  Creatinine, serum  (enoxaparin (LOVENOX)    CrCl >/= 30 ml/min)  Weekly,   STAT    Comments: while on enoxaparin therapy    04/19/19 2216   04/20/19 7209  Basic metabolic panel  Tomorrow morning,   STAT     04/19/19 2216   04/20/19 0500  CBC  Tomorrow morning,   STAT     04/19/19 2216   04/20/19 0500  Procalcitonin  Daily,   STAT     04/19/19 2219   04/19/19 2220  Procalcitonin - Baseline  Add-on,   AD     04/19/19 2219   04/19/19 2219  Hemoglobin A1c  Add-on,   AD     04/19/19 2218   04/19/19 2217  Urine Culture  Once,   STAT     04/19/19 2216   04/19/19 2217  Lipase, blood  Add-on,   AD     04/19/19 2216   04/19/19 4709  Basic metabolic panel  ONCE - STAT,   STAT     04/19/19 2208   04/19/19 2200  Lactic acid, plasma  Now then every 2 hours,   STAT     04/19/19 2159   04/19/19 1906  Urine Drug Screen, Qualitative ([place] only)  Once,   STAT     04/19/19 1906   04/19/19 1846  Blood culture (routine x 2)  BLOOD CULTURE X 2,   STAT     04/19/19 1846   04/19/19 1846  Urinalysis, Routine w reflex microscopic  Once,   STAT     04/19/19 1846   04/19/19 1846  Urine culture  ONCE - STAT,   STAT     04/19/19 1846          Vitals/Pain Today's Vitals   04/19/19 1930 04/19/19 2000 04/19/19 2020 04/19/19 2030  BP: (!) 124/55 106/66  115/75  Pulse: (!) 148 (!) 148  (!) 148  Resp: (!) 29 (!) 30  (!) 23  Temp:      TempSrc:      SpO2: 99% 97%  96%  Weight:  69.9 kg 69.8 kg   Height:   5' 0.5" (1.537 m)     Isolation Precautions No active isolations  Medications Medications  insulin regular, human (MYXREDLIN) 100 units/ 100 mL infusion (10.8 Units/hr Intravenous New Bag/Given 04/19/19 2104)  enoxaparin (LOVENOX) injection  40 mg (has no administration in time range)  acetaminophen (TYLENOL) tablet  650 mg (has no administration in time range)    Or  acetaminophen (TYLENOL) suppository 650 mg (has no administration in time range)  ondansetron (ZOFRAN) tablet 4 mg (has no administration in time range)    Or  ondansetron (ZOFRAN) injection 4 mg (has no administration in time range)  0.9 %  sodium chloride infusion (has no administration in time range)  metoprolol tartrate (LOPRESSOR) injection 5 mg (has no administration in time range)  LORazepam (ATIVAN) 2 MG/ML injection (1 mg  Given 04/19/19 1936)  ceFEPIme (MAXIPIME) 2 g in sodium chloride 0.9 % 100 mL IVPB (0 g Intravenous Stopped 04/19/19 2049)  metroNIDAZOLE (FLAGYL) IVPB 500 mg (0 mg Intravenous Stopped 04/19/19 2055)  vancomycin (VANCOCIN) IVPB 1000 mg/200 mL premix (0 mg Intravenous Stopped 04/19/19 2147)  sodium chloride 0.9 % bolus 1,000 mL (0 mLs Intravenous Stopped 04/19/19 2035)  acetaminophen (TYLENOL) suppository 650 mg (650 mg Rectal Given 04/19/19 2007)  vancomycin (VANCOCIN) 500 mg in sodium chloride 0.9 % 100 mL IVPB (0 mg Intravenous Stopped 04/19/19 2305)  sodium chloride 0.9 % bolus 1,000 mL (1,000 mLs Intravenous New Bag/Given 04/19/19 2033)    Mobility non-ambulatory     Focused Assessments    Recommendations: See Admitting Provider Note  Report given to:   Additional Notes:

## 2019-04-19 NOTE — ED Notes (Signed)
Spoke with pt's step daughter to get history and give update on pt.

## 2019-04-19 NOTE — H&P (Signed)
[facility] [place] NAME: Kristy [name]    MR#:  [id number]  DATE OF BIRTH:  12/29/1950  DATE OF ADMISSION:  04/19/2019  PRIMARY CARE PHYSICIAN: Owens Loffler, MD   REQUESTING/REFERRING PHYSICIAN: Dr Jari Pigg  CHIEF COMPLAINT:   Chief Complaint  Patient presents with  . Altered Mental Status    HISTORY OF PRESENT ILLNESS:  Kristy [name]  is a 68 y.o. female brought in with altered mental status.  Patient unable to give a history at this time.  Spoke with staff daughter on the phone.  Couple days ago the patient had a fall in the bathtub and was in the bathtub for a few hours.  Has had a few falls over few periods of time.  Yesterday she could not reach her on the phone and she went over there and found her on the side of the bed in fetal position.  She had vomited.  There were soda cans all over.  The patient has not taken her medications in a few days.  They brought her in and she was found to be in DKA.  She also had a fever.  COVID-19 testing was negative.  Chest x-ray negative.  Urine analysis was unable to be obtained in the ER.  Hospitalist services were contacted for further evaluation.  PAST MEDICAL HISTORY:   Past Medical History:  Diagnosis Date  . Allergic rhinitis   . Allergy   . Cataract    mild   . Crohn's disease ([facility]) 10/13/2013  . Diverticulosis   . GERD (gastroesophageal reflux disease)   . Gout   . HFrEF (heart failure with reduced ejection fraction) ([facility] [facility])    a. 12/2008 Cath: EF 45% w/ inf HK; b. 07/2009 Echo: EF 50-55%; c. 08/2018 Echo: EF 20-25%.  Marland Kitchen Hyperlipidemia   . Hypertension   . IBS (irritable bowel syndrome)   . Left bundle branch block   . Neuromuscular disorder ([facility])    neuropathy  . NICM (nonischemic cardiomyopathy) ([place])    a. 12/2008 Cath: no significant dzs, EF 45% w/ inf HK->Med Rx; b. 07/2009 Echo: EF 50-55%; c. 08/2018 Echo: EF 20-25%, ant/antsept HK, mild MR, mildly dil LA, nl RV fx;  d. 08/2018 Cath: D1 80, otw nonobs dzs->Med rx.  . Non-obstructive CAD (coronary artery disease)    a. 12/2008 Cath: no significant dzs, EF 45% w/ inf HK->Med Rx; b. 08/2018 Cath: LM nl, LAD min irregs, D1 80, LCX 1md, RCA min irregs->Med Rx.  . Osteoarthritis   . Poorly controlled Diabetes mellitus    a. 07/2018 A1c 13.1.  .Marland KitchenSymptomatic cholelithiasis     PAST SURGICAL HISTORY:   Past Surgical History:  Procedure Laterality Date  . COLONOSCOPY    . DILATION AND CURETTAGE OF UTERUS    . RIGHT/LEFT HEART CATH AND CORONARY ANGIOGRAPHY N/A 08/26/2018   Procedure: RIGHT/LEFT HEART CATH AND CORONARY ANGIOGRAPHY;  Surgeon: AWellington Hampshire MD;  [facility];  Service: Cardiovascular;  Laterality: N/A;  . SHOULDER SURGERY     15 + yrs ago   . SKIN SURGERY     nose   . VAGINAL HYSTERECTOMY      SOCIAL HISTORY:   Social History   Tobacco Use  . Smoking status: Former Smoker    Packs/day: 0.75    Years: 30.00    Pack years: 22.50    Types: Cigarettes    Quit date: 06/06/1997    Years since quitting:  21.8  . Smokeless tobacco: Never Used  Substance Use Topics  . Alcohol use: No    FAMILY HISTORY:   Family History  Problem Relation Age of Onset  . Hypertension Father   . Colon cancer Neg Hx   . Colon polyps Neg Hx   . Rectal cancer Neg Hx   . Stomach cancer Neg Hx     DRUG ALLERGIES:   Allergies  Allergen Reactions  . Fish Oil Other (See Comments)    Gout  . Glimepiride     REACTION: hypoglycemia  . Guanfacine Hcl     REACTION: unspecified  . Rosiglitazone Other (See Comments)    CHF    REVIEW OF SYSTEMS:  Unable to obtain at this time  MEDICATIONS AT HOME:   Prior to Admission medications   Medication Sig Start Date End Date Taking? Authorizing Provider  acetaminophen (TYLENOL) 325 MG tablet Take 325 mg by mouth daily as needed for moderate pain or headache.   Yes [provider]  carvedilol (COREG) 6.25 MG tablet Take 1  tablet (6.25 mg total) by mouth 2 (two) times daily. 08/22/18  Yes Dunn, Areta Haber, PA-C  chlorpheniramine (CHLOR-TRIMETON) 4 MG tablet Take 4 mg by mouth daily as needed for allergies.   Yes [provider]  FLUoxetine (PROZAC) 20 MG capsule Take 1 capsule (20 mg total) by mouth daily. 02/12/19  Yes Copland, [name], MD  insulin regular human CONCENTRATED (HUMULIN R U-500 KWIKPEN) 500 UNIT/ML kwikpen Inject 60-100 Units into the skin See admin instructions. Inject 100u under the skin at breakfast and lunch and inject 80u under the skin at dinner - inject 60u if eating a small / protein rich meal   Yes [provider]  Insulin Syringe-Needle U-100 (B-D INS SYRINGE 0.5CC/31GX5/16) 31G X 5/16" 0.5 ML MISC USE AS DIRECTED THREE TIMES A DAY 08/06/17  Yes Copland, [name], MD  ivabradine (CORLANOR) 5 MG TABS tablet Take 1 tablet (5 mg total) by mouth 2 (two) times daily with a meal. 01/23/19  Yes Theora Gianotti, NP  metroNIDAZOLE (METROGEL) 0.75 % gel Apply 1 application topically daily as needed (acne).   Yes [provider]  ONE TOUCH ULTRA TEST test strip USE AS PER PACKAGE INSTRUCTIONS CHECK SUGARS UP TO-FOUR TIMES A DAY OR AS PRESCRIBED 05/21/17  Yes Copland, [name], MD  Probiotic Product (ALIGN) 4 MG CAPS Take 4 mg by mouth daily.    Yes [provider]  sacubitril-valsartan (ENTRESTO) 49-51 MG Take 1 tablet (49/51 mg) by mouth 2 times daily starting on Sunday 09/15/18 01/16/19  Yes Gollan, Kathlene November, MD  NONFORMULARY OR COMPOUNDED ITEM See pharmacy note Patient taking differently: Apply 1 application topically at bedtime as needed (neuropathy). Peripheral Neuropathy Cream-  Bupivacaine 1%, Doxepin 3%, Gabapentin 6%, Pentoxifylline 3%, Topiramate 1%  Apply 1-2 grams to affected area 3-4 times daily  Qty. 120 gm 07/20/17   Edrick Kins, DPM      VITAL SIGNS:  Blood pressure 115/75, pulse (!) 148, temperature (!) 101.2 F (38.4 C), temperature source  Rectal, resp. rate (!) 23, height 5' 0.5" (1.537 m), weight 69.8 kg, SpO2 96 %.  PHYSICAL EXAMINATION:  GENERAL:  68 y.o.-year-old patient lying in the bed with shallow breaths EYES: Pupils equal, round, reactive to light and accommodation. No scleral icterus.  HEENT: Head atraumatic, normocephalic. Oropharynx and nasopharynx clear.  NECK:  Supple, no jugular venous distention. No thyroid enlargement, no tenderness.  LUNGS: Normal breath sounds bilaterally, no wheezing,  rales,rhonchi or crepitation. No use of accessory muscles of respiration.  CARDIOVASCULAR: S1, S2 normal. No murmurs, rubs, or gallops.  ABDOMEN: Soft, nontender, nondistended. Bowel sounds present. No organomegaly or mass.  EXTREMITIES: No pedal edema, cyanosis, or clubbing.  NEUROLOGIC: Patient moving all of her extremities on her own PSYCHIATRIC: The patient is lethargic.  SKIN: No rash, lesion, or ulcer.   LABORATORY PANEL:   CBC Recent Labs  Lab 04/19/19 1854  WBC 25.9*  HGB 16.1*  HCT 53.0*  PLT 266   ------------------------------------------------------------------------------------------------------------------  Chemistries  Recent Labs  Lab 04/19/19 1854  NA 130*  K 5.0  CL 86*  CO2 12*  GLUCOSE 1,161*  BUN 34*  CREATININE 2.26*  CALCIUM 9.7  AST 45*  ALT 26  ALKPHOS 146*  BILITOT 1.9*   ------------------------------------------------------------------------------------------------------------------    RADIOLOGY:  Ct Head Wo Contrast  Result Date: 04/19/2019 CLINICAL DATA:  Altered level of consciousness (LOC), unexplained AMS; Neck pain, chronic, abn neuro exam, neg xray AMS. Found unresponsive. EXAM: CT HEAD WITHOUT CONTRAST CT CERVICAL SPINE WITHOUT CONTRAST TECHNIQUE: Multidetector CT imaging of the head and cervical spine was performed following the standard protocol without intravenous contrast. Multiplanar CT image reconstructions of the cervical spine were also generated.  COMPARISON:  Head CT 10/30/2018 FINDINGS: CT HEAD FINDINGS Brain: No intracranial hemorrhage, mass effect, or midline shift. No hydrocephalus. The basilar cisterns are patent. No evidence of territorial infarct or acute ischemia. Unchanged degree of atrophy and mild chronic small vessel ischemia. No extra-axial or intracranial fluid collection. Vascular: No hyperdense vessel. Skull: No fracture or focal lesion. Sinuses/Orbits: Paranasal sinuses and mastoid air cells are clear. The visualized orbits are unremarkable. Other: None. CT CERVICAL SPINE FINDINGS Alignment: Trace anterolisthesis of C4 on C5 is likely facet mediated. Alignment otherwise maintained. Skull base and vertebrae: No acute fracture. Vertebral body heights are maintained. The dens and skull base are intact. Incidental vertebral body hemangioma within C4. Soft tissues and spinal canal: No prevertebral fluid or swelling. No visible canal hematoma. Disc levels: Disc space narrowing and endplate spurring at G4-W1 and C6-C7. Multilevel facet hypertrophy. No significant spinal canal stenosis. Bilateral neural foraminal stenosis at C5-C6 and right neural foraminal stenosis at C6-C7. Upper chest: 11 mm right thyroid nodule. No dedicated imaging follow-up is needed due to size. Other: None. IMPRESSION: 1. No acute intracranial abnormality. Mild atrophy and chronic small vessel ischemia, similar to prior exam. 2. Multilevel degenerative change in the cervical spine without acute abnormality. Electronically Signed   By: Keith Rake M.D.   On: 04/19/2019 19:41   Ct Cervical Spine Wo Contrast  Result Date: 04/19/2019 CLINICAL DATA:  Altered level of consciousness (LOC), unexplained AMS; Neck pain, chronic, abn neuro exam, neg xray AMS. Found unresponsive. EXAM: CT HEAD WITHOUT CONTRAST CT CERVICAL SPINE WITHOUT CONTRAST TECHNIQUE: Multidetector CT imaging of the head and cervical spine was performed following the standard protocol without intravenous  contrast. Multiplanar CT image reconstructions of the cervical spine were also generated. COMPARISON:  Head CT 10/30/2018 FINDINGS: CT HEAD FINDINGS Brain: No intracranial hemorrhage, mass effect, or midline shift. No hydrocephalus. The basilar cisterns are patent. No evidence of territorial infarct or acute ischemia. Unchanged degree of atrophy and mild chronic small vessel ischemia. No extra-axial or intracranial fluid collection. Vascular: No hyperdense vessel. Skull: No fracture or focal lesion. Sinuses/Orbits: Paranasal sinuses and mastoid air cells are clear. The visualized orbits are unremarkable. Other: None. CT CERVICAL SPINE FINDINGS Alignment: Trace anterolisthesis of C4 on C5 is likely  facet mediated. Alignment otherwise maintained. Skull base and vertebrae: No acute fracture. Vertebral body heights are maintained. The dens and skull base are intact. Incidental vertebral body hemangioma within C4. Soft tissues and spinal canal: No prevertebral fluid or swelling. No visible canal hematoma. Disc levels: Disc space narrowing and endplate spurring at J7-V6 and C6-C7. Multilevel facet hypertrophy. No significant spinal canal stenosis. Bilateral neural foraminal stenosis at C5-C6 and right neural foraminal stenosis at C6-C7. Upper chest: 11 mm right thyroid nodule. No dedicated imaging follow-up is needed due to size. Other: None. IMPRESSION: 1. No acute intracranial abnormality. Mild atrophy and chronic small vessel ischemia, similar to prior exam. 2. Multilevel degenerative change in the cervical spine without acute abnormality. Electronically Signed   By: Keith Rake M.D.   On: 04/19/2019 19:41   Dg Chest Port 1 View  Result Date: 04/19/2019 CLINICAL DATA:  Altered mental status. EXAM: PORTABLE CHEST 1 VIEW COMPARISON:  October 30, 2018 FINDINGS: The heart size and mediastinal contours are within normal limits. Both lungs are clear. The visualized skeletal structures are unremarkable. IMPRESSION:  No active disease. Electronically Signed   By: Constance Holster M.D.   On: 04/19/2019 19:42    EKG:   Wide-complex tachycardia 160 bpm  IMPRESSION AND PLAN:   1.  Diabetic ketoacidosis.  Placed on insulin drip.  Send off a lipase.  Serial BMPs. 2.  Acute encephalopathy.  Could be secondary to diabetic ketoacidosis or sepsis.  CT scan of the head negative.  Continue to monitor. 3.  Clinical sepsis.  Unclear cause.  Patient has fever and leukocytosis and tachycardia.  Empiric antibiotics.  Follow-up cultures.  Follow-up urine. 4.  Wide-complex tachycardia.  Patient has had a history of left bundle branch block.  Has seen [facility] cardiology in the past if needed.  Hopefully with hydration heart rate will come down.  PRN metoprolol. 5.  History of nonischemic cardiomyopathy.  No signs of congestive heart failure. 6.  Hypertension 7.  Hyperlipidemia unspecified 8.  GERD 9.  History of Crohn's disease  All the records are reviewed and case discussed with ED provider. Management plans discussed with the patient, family and they are in agreement.  CODE STATUS: full code  TOTAL TIME TAKING CARE OF THIS PATIENT: 50 minutes.    Loletha Grayer M.D on 04/19/2019 at 10:59 PM  Between 7am to 6pm - Pager - [phone]  After 6pm call admission pager [phone]  [facility]  [phone]  CC: Primary care physician; Owens Loffler, MD

## 2019-04-19 NOTE — ED Provider Notes (Signed)
Kristy [name]  ____________________________________________   First MD Initiated Contact with Patient 04/19/19 1845     (approximate)  I have reviewed the triage vital signs and the nursing notes.   HISTORY  Chief Complaint Altered Mental Status    HPI Kristy [name] is a 68 y.o. female  GERD, gout, heart failure, hyperlipidemia, hypertension who presents with altered mental status.  Patient daughter last heard from her last night.  Wellness check was called.  Upon arrival patient was found on the ground.  Patient was altered and nonresponsive.  Sugar was read as undetectable.  Upon arrival patient is moving around and moaning.   Unable to get full HPI due to patient's altered mental status.          Past Medical History:  Diagnosis Date   Allergic rhinitis    Allergy    Cataract    mild    Crohn's disease ([place]) 10/13/2013   Diverticulosis    GERD (gastroesophageal reflux disease)    Gout    HFrEF (heart failure with reduced ejection fraction) ([place])    a. 12/2008 Cath: EF 45% w/ inf HK; b. 07/2009 Echo: EF 50-55%; c. 08/2018 Echo: EF 20-25%.   Hyperlipidemia    Hypertension    IBS (irritable bowel syndrome)    Left bundle branch block    Neuromuscular disorder (HCC)    neuropathy   NICM (nonischemic cardiomyopathy) ([place])    a. 12/2008 Cath: no significant dzs, EF 45% w/ inf HK->Med Rx; b. 07/2009 Echo: EF 50-55%; c. 08/2018 Echo: EF 20-25%, ant/antsept HK, mild MR, mildly dil LA, nl RV fx; d. 08/2018 Cath: D1 80, otw nonobs dzs->Med rx.   Non-obstructive CAD (coronary artery disease)    a. 12/2008 Cath: no significant dzs, EF 45% w/ inf HK->Med Rx; b. 08/2018 Cath: LM nl, LAD min irregs, D1 80, LCX 83md, RCA min irregs->Med Rx.   Osteoarthritis    Poorly controlled Diabetes mellitus    a. 07/2018 A1c 13.1.   Symptomatic cholelithiasis     Patient Active Problem List   Diagnosis Date  Noted   NICM (nonischemic cardiomyopathy) ([facility] 034/19/6222  Chronic systolic heart failure ([facility] 08/22/2018   Coronary artery disease, non-occlusive 09/13/2016   Sinus tachycardia 09/20/2015   Primary osteoarthritis involving multiple joints 03/14/2015   Personal history of other malignant neoplasm of skin 12/01/2014   Crohn's disease ([facility] [place] 10/13/2013   Major depressive disorder, recurrent, in full remission ([facility] 05/01/2011   TRANSAMINASES, SERUM, ELEVATED 05/01/2010   PSORIASIS 08/02/2009   Hyperlipidemia 05/11/2009   GOUT 02/05/2009   Type 2 diabetes mellitus without complication, with long-term current use of insulin ([facility]    Essential hypertension 04/18/2007   LBBB (left bundle branch block) 04/18/2007   ALLERGIC RHINITIS 04/18/2007   GALLSTONES 04/18/2007    Past Surgical History:  Procedure Laterality Date   COLONOSCOPY     DILATION AND CURETTAGE OF UTERUS     RIGHT/LEFT HEART CATH AND CORONARY ANGIOGRAPHY N/A 08/26/2018   Procedure: RIGHT/LEFT HEART CATH AND CORONARY ANGIOGRAPHY;  Surgeon: AWellington Hampshire MD;  [facility];  Service: Cardiovascular;  Laterality: N/A;   SHOULDER SURGERY     15 + yrs ago    SKIN SURGERY     nose    VAGINAL HYSTERECTOMY      Prior to Admission medications   Medication Sig Start Date End Date Taking? Authorizing Provider  acetaminophen (TYLENOL) 325 MG tablet  Take 325 mg by mouth daily as needed for moderate pain or headache.    [provider]  carvedilol (COREG) 6.25 MG tablet Take 1 tablet (6.25 mg total) by mouth 2 (two) times daily. 08/22/18   Dunn, Areta Haber, PA-C  chlorpheniramine (CHLOR-TRIMETON) 4 MG tablet Take 4 mg by mouth daily as needed for allergies.    [provider]  FLUoxetine (PROZAC) 20 MG capsule Take 1 capsule (20 mg total) by mouth daily. 02/12/19   Copland, [name], MD  insulin regular human CONCENTRATED (HUMULIN R) 500 UNIT/ML kwikpen Inject 100 Units into  the skin 3 (three) times daily with meals. 12/27/18 03/27/19  Copland, [name], MD  Insulin Syringe-Needle U-100 (B-D INS SYRINGE 0.5CC/31GX5/16) 31G X 5/16" 0.5 ML MISC USE AS DIRECTED THREE TIMES A DAY 08/06/17   Copland, [name], MD  ivabradine (CORLANOR) 5 MG TABS tablet Take 1 tablet (5 mg total) by mouth 2 (two) times daily with a meal. 01/23/19   Theora Gianotti, NP  metroNIDAZOLE (METROGEL) 0.75 % gel Apply 1 application topically daily as needed (acne).    [provider]  mupirocin ointment (BACTROBAN) 2 %  02/04/19   [provider]  NONFORMULARY OR COMPOUNDED ITEM See pharmacy [name] Patient taking differently: Apply 1 application topically at bedtime as needed (neuropathy). Peripheral Neuropathy Cream-  Bupivacaine 1%, Doxepin 3%, Gabapentin 6%, Pentoxifylline 3%, Topiramate 1%  Apply 1-2 grams to affected area 3-4 times daily  Qty. 120 gm 07/20/17   Evans, Dorathy Daft, DPM  ONE TOUCH ULTRA TEST test strip USE AS PER PACKAGE INSTRUCTIONS CHECK SUGARS UP TO-FOUR TIMES A DAY OR AS PRESCRIBED 05/21/17   Copland, [name], MD  Probiotic Product (ALIGN) 4 MG CAPS Take 4 mg by mouth daily.     [provider]  sacubitril-valsartan Delene Loll) 49-51 MG Take 1 tablet (49/51 mg) by mouth 2 times daily starting on Sunday 09/15/18 01/16/19   Minna Merritts, MD    Allergies Fish oil, Glimepiride, Guanfacine hcl, and Rosiglitazone  Family History  Problem Relation Age of Onset   Hypertension Father    Colon cancer Neg Hx    Colon polyps Neg Hx    Rectal cancer Neg Hx    Stomach cancer Neg Hx     Social History Social History   Tobacco Use   Smoking status: Former Smoker    Packs/day: 0.75    Years: 30.00    Pack years: 22.50    Types: Cigarettes    Quit date: 06/06/1997    Years since quitting: 21.8   Smokeless tobacco: Never Used  Substance Use Topics   Alcohol use: No   Drug use: No      Review of Systems Unable to get review of systems  due to patient's altered mental status. ____________________________________________   PHYSICAL EXAM:  Vitals:   04/19/19 1857 04/19/19 1858  BP:  109/63  Pulse:  (!) 152  Resp:  (!) 30  Temp:  (!) 101.2 F (38.4 C)  SpO2: 95% 97%    Constitutional: Patient is moving around, moaning, confused. Eyes: Conjunctivae are normal. EOMI. Head: Atraumatic. Nose: No congestion/rhinnorhea. Mouth/Throat: Dry membranes Neck: No stridor. Trachea Midline. FROM Cardiovascular: Tachycardic grossly normal heart sounds.  Good peripheral circulation. Respiratory: Normal respiratory effort.  No retractions. Lungs CTAB. Gastrointestinal: Soft and nontender. No distention. No abdominal bruits.  Musculoskeletal: No lower extremity tenderness nor edema.  No joint effusions. Neurologic: GCS of 10, no gross focal neurologic deficits are appreciated.  Skin:  Skin is warm, dry and intact. No rash noted. Psychiatric: Unable to fully assess due to altered mental status. GU: Deferred   ____________________________________________   LABS (all labs ordered are listed, but only abnormal results are displayed)  Labs Reviewed  CBC WITH DIFFERENTIAL/PLATELET - Abnormal; Notable for the following components:      Result Value   WBC 25.9 (*)    RBC 5.42 (*)    Hemoglobin 16.1 (*)    HCT 53.0 (*)    Neutro Abs 20.3 (*)    Monocytes Absolute 2.5 (*)    Abs Immature Granulocytes 0.22 (*)    All other components within normal limits  CULTURE, BLOOD (ROUTINE X 2)  CULTURE, BLOOD (ROUTINE X 2)  URINE CULTURE  SARS CORONAVIRUS 2 (HOSPITAL ORDER, PERFORMED [facility])  APTT  PROTIME-INR  ETHANOL  COMPREHENSIVE METABOLIC PANEL  URINALYSIS, ROUTINE W REFLEX MICROSCOPIC  BETA-HYDROXYBUTYRIC ACID  BLOOD GAS, VENOUS  T4, FREE  TSH  CK  URINE DRUG SCREEN, QUALITATIVE ([place] ONLY)  SALICYLATE LEVEL  ACETAMINOPHEN LEVEL   ____________________________________________   ED ECG REPORT I,  Vanessa [place], the attending physician, personally viewed and interpreted this ECG.  EKG is tachycardia with wide complex rate of 160, prolonged QTC at 573, prolonged QRS.  Looks like left bundle branch block but will review old EKG.   Reviewed patient's prior EKGs and she does have a left bundle branch block at baseline. ____________________________________________  RADIOLOGY I, Vanessa [place], personally viewed and evaluated these images (plain radiographs) as part of my medical decision making, as well as reviewing the written report by the radiologist.  ED MD interpretation: chest xray negative.   Official radiology report(s): Ct Head Wo Contrast  Result Date: 04/19/2019 CLINICAL DATA:  Altered level of consciousness (LOC), unexplained AMS; Neck pain, chronic, abn neuro exam, neg xray AMS. Found unresponsive. EXAM: CT HEAD WITHOUT CONTRAST CT CERVICAL SPINE WITHOUT CONTRAST TECHNIQUE: Multidetector CT imaging of the head and cervical spine was performed following the standard protocol without intravenous contrast. Multiplanar CT image reconstructions of the cervical spine were also generated. COMPARISON:  Head CT 10/30/2018 FINDINGS: CT HEAD FINDINGS Brain: No intracranial hemorrhage, mass effect, or midline shift. No hydrocephalus. The basilar cisterns are patent. No evidence of territorial infarct or acute ischemia. Unchanged degree of atrophy and mild chronic small vessel ischemia. No extra-axial or intracranial fluid collection. Vascular: No hyperdense vessel. Skull: No fracture or focal lesion. Sinuses/Orbits: Paranasal sinuses and mastoid air cells are clear. The visualized orbits are unremarkable. Other: None. CT CERVICAL SPINE FINDINGS Alignment: Trace anterolisthesis of C4 on C5 is likely facet mediated. Alignment otherwise maintained. Skull base and vertebrae: No acute fracture. Vertebral body heights are maintained. The dens and skull base are intact. Incidental vertebral body hemangioma  within C4. Soft tissues and spinal canal: No prevertebral fluid or swelling. No visible canal hematoma. Disc levels: Disc space narrowing and endplate spurring at C3-J6 and C6-C7. Multilevel facet hypertrophy. No significant spinal canal stenosis. Bilateral neural foraminal stenosis at C5-C6 and right neural foraminal stenosis at C6-C7. Upper chest: 11 mm right thyroid nodule. No dedicated imaging follow-up is needed due to size. Other: None. IMPRESSION: 1. No acute intracranial abnormality. Mild atrophy and chronic small vessel ischemia, similar to prior exam. 2. Multilevel degenerative change in the cervical spine without acute abnormality. Electronically Signed   By: Keith Rake M.D.   On: 04/19/2019 19:41   Ct Cervical Spine Wo Contrast  Result Date: 04/19/2019  CLINICAL DATA:  Altered level of consciousness (LOC), unexplained AMS; Neck pain, chronic, abn neuro exam, neg xray AMS. Found unresponsive. EXAM: CT HEAD WITHOUT CONTRAST CT CERVICAL SPINE WITHOUT CONTRAST TECHNIQUE: Multidetector CT imaging of the head and cervical spine was performed following the standard protocol without intravenous contrast. Multiplanar CT image reconstructions of the cervical spine were also generated. COMPARISON:  Head CT 10/30/2018 FINDINGS: CT HEAD FINDINGS Brain: No intracranial hemorrhage, mass effect, or midline shift. No hydrocephalus. The basilar cisterns are patent. No evidence of territorial infarct or acute ischemia. Unchanged degree of atrophy and mild chronic small vessel ischemia. No extra-axial or intracranial fluid collection. Vascular: No hyperdense vessel. Skull: No fracture or focal lesion. Sinuses/Orbits: Paranasal sinuses and mastoid air cells are clear. The visualized orbits are unremarkable. Other: None. CT CERVICAL SPINE FINDINGS Alignment: Trace anterolisthesis of C4 on C5 is likely facet mediated. Alignment otherwise maintained. Skull base and vertebrae: No acute fracture. Vertebral body heights  are maintained. The dens and skull base are intact. Incidental vertebral body hemangioma within C4. Soft tissues and spinal canal: No prevertebral fluid or swelling. No visible canal hematoma. Disc levels: Disc space narrowing and endplate spurring at I3-B0 and C6-C7. Multilevel facet hypertrophy. No significant spinal canal stenosis. Bilateral neural foraminal stenosis at C5-C6 and right neural foraminal stenosis at C6-C7. Upper chest: 11 mm right thyroid nodule. No dedicated imaging follow-up is needed due to size. Other: None. IMPRESSION: 1. No acute intracranial abnormality. Mild atrophy and chronic small vessel ischemia, similar to prior exam. 2. Multilevel degenerative change in the cervical spine without acute abnormality. Electronically Signed   By: Keith Rake M.D.   On: 04/19/2019 19:41   Dg Chest Port 1 View  Result Date: 04/19/2019 CLINICAL DATA:  Altered mental status. EXAM: PORTABLE CHEST 1 VIEW COMPARISON:  October 30, 2018 FINDINGS: The heart size and mediastinal contours are within normal limits. Both lungs are clear. The visualized skeletal structures are unremarkable. IMPRESSION: No active disease. Electronically Signed   By: Constance Holster M.D.   On: 04/19/2019 19:42    ____________________________________________   PROCEDURES  Procedure(s) performed (including Critical Care):  .Critical Care Performed by: Vanessa Monrovia, MD Authorized by: Vanessa Moca, MD   Critical care provider statement:    Critical care time (minutes):  45   Critical care was necessary to treat or prevent imminent or life-threatening deterioration of the following conditions:  Sepsis and dehydration   Critical care was time spent personally by me on the following activities:  Discussions with consultants, evaluation of patient's response to treatment, examination of patient, ordering and performing treatments and interventions, ordering and review of laboratory studies, ordering and review of  radiographic studies, pulse oximetry, re-evaluation of patient's condition, obtaining history from patient or surrogate and review of old charts     ____________________________________________   INITIAL IMPRESSION / [place] / ED COURSE  KINGSTON GUILES was evaluated in Emergency Department on 04/19/2019 for the symptoms described in the history of present illness. She was evaluated in the context of the global COVID-19 pandemic, which necessitated consideration that the patient might be at risk for infection with the SARS-CoV-2 virus that causes COVID-19. Institutional protocols and algorithms that pertain to the evaluation of patients at risk for COVID-19 are in a state of rapid change based on information released by regulatory bodies including the CDC and federal and state organizations. These policies and algorithms were followed during the patient's care in the ED.  Patient presents altered with fever.  Discussed most concerning for bacteremia, infection.  Will get labs to evaluate for UTI, pneumonia, bacteremia.  Patient does not seem to have any abdominal pain but patient is altered.  Will get CT head evaluate for epidural subdural hematoma given patient was found on the ground.  Will get labs to evaluate for DKA given high sugar noted.  Patient was given 1 mg of Ativan in order to obtain IV access to get labs.  Patient looks very dry on exam therefore give it was given 1 L fluid.  Unable to find patient's old echo to find out  EF.  We will continue to closely monitor respiratory status.   White count of 25.9 which is consistent with my concern for infection.  Patient was already started as a sepsis alert and broad-spectrum antibiotics have already been ordered.  Glucose noted to be 1161 with a creatinine of 2.26.  K was 5.  At this time will start insulin therapy.  I am concerned about the potential of hyperosmolar hyperglycemic state vs DKA.  We will get a serum  osm..  Chest x-ray reviewed and negative.  CT head was negative for hemorrhage.  Covid negative.  Pt needs ICU bed for DKA, sepsis from unknown cause on board antibiotics.  Hr has gone down from 160 to 140s so seems to be responding to fluid.   D/w hospital team and they will admit.  ____________________________________________   FINAL CLINICAL IMPRESSION(S) / ED DIAGNOSES   Final diagnoses:  Sepsis, due to unspecified organism, unspecified whether acute organ dysfunction present [facility])  Diabetic ketoacidosis without coma associated with other specified diabetes mellitus ([place])      MEDICATIONS GIVEN DURING THIS VISIT:  Medications  vancomycin (VANCOCIN) 500 mg in sodium chloride 0.9 % 100 mL IVPB (500 mg Intravenous New Bag/Given 04/19/19 2157)  insulin regular, human (MYXREDLIN) 100 units/ 100 mL infusion (10.8 Units/hr Intravenous New Bag/Given 04/19/19 2104)  LORazepam (ATIVAN) 2 MG/ML injection (1 mg  Given 04/19/19 1936)  ceFEPIme (MAXIPIME) 2 g in sodium chloride 0.9 % 100 mL IVPB (0 g Intravenous Stopped 04/19/19 2049)  metroNIDAZOLE (FLAGYL) IVPB 500 mg (0 mg Intravenous Stopped 04/19/19 2055)  vancomycin (VANCOCIN) IVPB 1000 mg/200 mL premix (0 mg Intravenous Stopped 04/19/19 2147)  sodium chloride 0.9 % bolus 1,000 mL (0 mLs Intravenous Stopped 04/19/19 2035)  acetaminophen (TYLENOL) suppository 650 mg (650 mg Rectal Given 04/19/19 2007)  sodium chloride 0.9 % bolus 1,000 mL (1,000 mLs Intravenous New Bag/Given 04/19/19 2033)     ED Discharge Orders    None       [name]:  This document was prepared using Dragon voice recognition software and may include unintentional dictation errors.   Vanessa Tollette, MD 04/19/19 2213

## 2019-04-19 NOTE — Consult Note (Signed)
PHARMACY -  BRIEF ANTIBIOTIC NOTE   Pharmacy has received consult(s) for Vancomycin/Cefepime from an ED provider.  The patient's profile has been reviewed for ht/wt/allergies/indication/available labs.    One time order(s) placed for Cefepime 2g x 1 and Vancomycin 1g x1 followed by 521m (for a total load of 1503m  Further antibiotics/pharmacy consults should be ordered by admitting physician if indicated.                       Thank you,  ChLu DuffelPharmD, BCPS Clinical Pharmacist 04/19/2019 7:02 PM

## 2019-04-19 NOTE — ED Notes (Signed)
Dr. Jari Pigg aware of Serum Osmolality of 372.

## 2019-04-19 NOTE — Consult Note (Signed)
CODE SEPSIS - PHARMACY COMMUNICATION  **Broad Spectrum Antibiotics should be administered within 1 hour of Sepsis diagnosis**  Time Code Sepsis Called/Page Received: 1859  Antibiotics Ordered: Vancomycin/Cefepime/Metronidazole  Time of 1st antibiotic administration: 1958  Additional action taken by pharmacy: Spoke with nursing @ [phone]  If necessary, Name of Provider/Nurse Contacted: Mackey Birchwood, PharmD, BCPS Clinical Pharmacist 04/19/2019 7:41 PM

## 2019-04-19 NOTE — Progress Notes (Signed)
Pharmacy Antibiotic Note  IMAGINE NEST is a 68 y.o. female admitted on 04/19/2019 with sepsis.  Pharmacy has been consulted for vanc/cefepime dosing w/o h/o CKD or DM, but w/ NICM. Patient admitted for being unresponsive and AMS. CT head negative, CT spine negative, CXR no active disease or cardiomegaly, EKG showing sinus tach w/ wide complexes. Meets 3/4 SIRS criteria and 1/3 qSOFA, w/ BG of 1161, pH of < 7.19 on VBG, w/ gap anion met acidosis, and beta-hydroxy in urine, osm of 372 and a pseudohyponatremia.   Plan: Patient is currently in AKI w/ no documented h/o CKD w/ a baseline Scr of 0.8 - 1.0. Patient received initial doses of abx in ED w/ a total vanc load of 1.5g, cefepime 2g, and flagyl 500 mg IV x 1. Since patient is in AKI will check a random level w/ am labs and continue cefepime 2g IV q24h per CrCl 11 - 29 ml/min (micromedex) and flagyl 500 mg IV q8h. Goal random < 20 mcg/mL. Will dose per random levels for now until renal function stabilizes to place on a scheduled regimen. Will continue to monitor and adjust doses as necessary.  Height: 5' 0.5" (153.7 cm) Weight: 153 lb 15.9 oz (69.8 kg) IBW/kg (Calculated) : 46.65  Temp (24hrs), Avg:101.2 F (38.4 C), Min:101.2 F (38.4 C), Max:101.2 F (38.4 C)  Recent Labs  Lab 04/19/19 1854  WBC 25.9*  CREATININE 2.26*  LATICACIDVEN <0.3*    Estimated Creatinine Clearance: 21.4 mL/min (A) (by C-G formula based on SCr of 2.26 mg/dL (H)).    Allergies  Allergen Reactions  . Fish Oil Other (See Comments)    Gout  . Glimepiride     REACTION: hypoglycemia  . Guanfacine Hcl     REACTION: unspecified  . Rosiglitazone Other (See Comments)    CHF    Thank you for allowing pharmacy to be a part of this patient's care.  Tobie Lords, PharmD, BCPS Clinical Pharmacist 04/19/2019 11:59 PM

## 2019-04-19 NOTE — ED Triage Notes (Signed)
Pt to ED by EMS from home unresponsive. Pt's daughter called EMS because unable to get in touch with patient today.Pt responds to pain.

## 2019-04-20 ENCOUNTER — Inpatient Hospital Stay: Payer: Medicare HMO

## 2019-04-20 DIAGNOSIS — A419 Sepsis, unspecified organism: Secondary | ICD-10-CM

## 2019-04-20 DIAGNOSIS — E0811 Diabetes mellitus due to underlying condition with ketoacidosis with coma: Secondary | ICD-10-CM

## 2019-04-20 LAB — GLUCOSE, CAPILLARY
Glucose-Capillary: 138 mg/dL — ABNORMAL HIGH (ref 70–99)
Glucose-Capillary: 143 mg/dL — ABNORMAL HIGH (ref 70–99)
Glucose-Capillary: 150 mg/dL — ABNORMAL HIGH (ref 70–99)
Glucose-Capillary: 153 mg/dL — ABNORMAL HIGH (ref 70–99)
Glucose-Capillary: 169 mg/dL — ABNORMAL HIGH (ref 70–99)
Glucose-Capillary: 183 mg/dL — ABNORMAL HIGH (ref 70–99)
Glucose-Capillary: 189 mg/dL — ABNORMAL HIGH (ref 70–99)
Glucose-Capillary: 222 mg/dL — ABNORMAL HIGH (ref 70–99)
Glucose-Capillary: 266 mg/dL — ABNORMAL HIGH (ref 70–99)
Glucose-Capillary: 369 mg/dL — ABNORMAL HIGH (ref 70–99)
Glucose-Capillary: 372 mg/dL — ABNORMAL HIGH (ref 70–99)
Glucose-Capillary: 399 mg/dL — ABNORMAL HIGH (ref 70–99)
Glucose-Capillary: 473 mg/dL — ABNORMAL HIGH (ref 70–99)
Glucose-Capillary: 552 mg/dL (ref 70–99)
Glucose-Capillary: 99 mg/dL (ref 70–99)
Glucose-Capillary: >600 mg/dL (ref 70–99)
Glucose-Capillary: >600 mg/dL (ref 70–99)

## 2019-04-20 LAB — URINALYSIS, ROUTINE W REFLEX MICROSCOPIC
Bacteria, UA: NONE SEEN
Bilirubin Urine: NEGATIVE
Glucose, UA: >=500 mg/dL — AB
Ketones, ur: 20 mg/dL — AB
Leukocytes,Ua: NEGATIVE
Nitrite: NEGATIVE
Protein, ur: 100 mg/dL — AB
Specific Gravity, Urine: 1.026 (ref 1.005–1.030)
Squamous Epithelial / HPF: NONE SEEN (ref 0–5)
pH: 5 (ref 5.0–8.0)

## 2019-04-20 LAB — CBC
HCT: 42.1 % (ref 36.0–46.0)
Hemoglobin: 14.3 g/dL (ref 12.0–15.0)
MCH: 30.2 pg (ref 26.0–34.0)
MCHC: 34 g/dL (ref 30.0–36.0)
MCV: 89 fL (ref 80.0–100.0)
Platelets: 205 10*3/uL (ref 150–400)
RBC: 4.73 MIL/uL (ref 3.87–5.11)
RDW: 13.3 % (ref 11.5–15.5)
WBC: 28.4 10*3/uL — ABNORMAL HIGH (ref 4.0–10.5)
nRBC: 0 % (ref 0.0–0.2)

## 2019-04-20 LAB — URINE DRUG SCREEN, QUALITATIVE (ARMC ONLY)
Amphetamines, Ur Screen: NOT DETECTED
Barbiturates, Ur Screen: NOT DETECTED
Benzodiazepine, Ur Scrn: NOT DETECTED
Cannabinoid 50 Ng, Ur ~~LOC~~: NOT DETECTED
Cocaine Metabolite,Ur ~~LOC~~: NOT DETECTED
MDMA (Ecstasy)Ur Screen: NOT DETECTED
Methadone Scn, Ur: NOT DETECTED
Opiate, Ur Screen: NOT DETECTED
Phencyclidine (PCP) Ur S: NOT DETECTED
Tricyclic, Ur Screen: NOT DETECTED

## 2019-04-20 LAB — BASIC METABOLIC PANEL
Anion gap: 21 — ABNORMAL HIGH (ref 5–15)
Anion gap: 15 (ref 5–15)
Anion gap: 13 (ref 5–15)
BUN: 39 mg/dL — ABNORMAL HIGH (ref 8–23)
BUN: 35 mg/dL — ABNORMAL HIGH (ref 8–23)
BUN: 30 mg/dL — ABNORMAL HIGH (ref 8–23)
CO2: 12 mmol/L — ABNORMAL LOW (ref 22–32)
CO2: 24 mmol/L (ref 22–32)
CO2: 28 mmol/L (ref 22–32)
Calcium: 9.2 mg/dL (ref 8.9–10.3)
Calcium: 8.8 mg/dL — ABNORMAL LOW (ref 8.9–10.3)
Calcium: 8.8 mg/dL — ABNORMAL LOW (ref 8.9–10.3)
Chloride: 102 mmol/L (ref 98–111)
Chloride: 106 mmol/L (ref 98–111)
Chloride: 104 mmol/L (ref 98–111)
Creatinine, Ser: 1.98 mg/dL — ABNORMAL HIGH (ref 0.44–1.00)
Creatinine, Ser: 1.51 mg/dL — ABNORMAL HIGH (ref 0.44–1.00)
Creatinine, Ser: 1.32 mg/dL — ABNORMAL HIGH (ref 0.44–1.00)
GFR calc Af Amer: 30 mL/min — ABNORMAL LOW (ref >60)
GFR calc Af Amer: 41 mL/min — ABNORMAL LOW (ref >60)
GFR calc Af Amer: 48 mL/min — ABNORMAL LOW (ref >60)
GFR calc non Af Amer: 26 mL/min — ABNORMAL LOW (ref >60)
GFR calc non Af Amer: 35 mL/min — ABNORMAL LOW (ref >60)
GFR calc non Af Amer: 42 mL/min — ABNORMAL LOW (ref >60)
Glucose, Bld: 816 mg/dL (ref 70–99)
Glucose, Bld: 446 mg/dL — ABNORMAL HIGH (ref 70–99)
Glucose, Bld: 238 mg/dL — ABNORMAL HIGH (ref 70–99)
Potassium: 3.9 mmol/L (ref 3.5–5.1)
Potassium: 2.8 mmol/L — ABNORMAL LOW (ref 3.5–5.1)
Potassium: 3.1 mmol/L — ABNORMAL LOW (ref 3.5–5.1)
Sodium: 135 mmol/L (ref 135–145)
Sodium: 145 mmol/L (ref 135–145)
Sodium: 145 mmol/L (ref 135–145)

## 2019-04-20 LAB — TRIGLYCERIDES: Triglycerides: 501 mg/dL — ABNORMAL HIGH (ref <150)

## 2019-04-20 LAB — TROPONIN I (HIGH SENSITIVITY)
Troponin I (High Sensitivity): 5057 ng/L (ref <18)
Troponin I (High Sensitivity): 5667 ng/L (ref <18)

## 2019-04-20 LAB — LACTIC ACID, PLASMA: Lactic Acid, Venous: 4.6 mmol/L (ref 0.5–1.9)

## 2019-04-20 LAB — MAGNESIUM: Magnesium: 2.2 mg/dL (ref 1.7–2.4)

## 2019-04-20 LAB — MRSA PCR SCREENING: MRSA by PCR: NEGATIVE

## 2019-04-20 LAB — PROCALCITONIN
Procalcitonin: 0.38 ng/mL
Procalcitonin: 0.36 ng/mL

## 2019-04-20 LAB — BLOOD GAS, ARTERIAL
Acid-base deficit: 10.5 mmol/L — ABNORMAL HIGH (ref 0.0–2.0)
Bicarbonate: 12.7 mmol/L — ABNORMAL LOW (ref 20.0–28.0)
FIO2: 0.21
O2 Saturation: 93.7 %
Patient temperature: 37
pCO2 arterial: 22 mmHg — ABNORMAL LOW (ref 32.0–48.0)
pH, Arterial: 7.37 (ref 7.350–7.450)
pO2, Arterial: 72 mmHg — ABNORMAL LOW (ref 83.0–108.0)

## 2019-04-20 LAB — LIPASE, BLOOD: Lipase: 955 U/L — ABNORMAL HIGH (ref 11–51)

## 2019-04-20 LAB — VANCOMYCIN, RANDOM: Vancomycin Rm: 22

## 2019-04-20 LAB — PHOSPHORUS
Phosphorus: <1 mg/dL — CL (ref 2.5–4.6)
Phosphorus: 2.5 mg/dL (ref 2.5–4.6)
Phosphorus: 3.3 mg/dL (ref 2.5–4.6)

## 2019-04-20 LAB — AMYLASE: Amylase: 599 U/L — ABNORMAL HIGH (ref 28–100)

## 2019-04-20 LAB — POTASSIUM: Potassium: 2.9 mmol/L — ABNORMAL LOW (ref 3.5–5.1)

## 2019-04-20 MED ORDER — SODIUM BICARBONATE 8.4 % IV SOLN
INTRAVENOUS | Status: AC
  Administered 2019-04-20: 02:00:00 100 meq via INTRAVENOUS
  Filled 2019-04-20: qty 100

## 2019-04-20 MED ORDER — TRAZODONE HCL 100 MG PO TABS
100.0000 mg | ORAL_TABLET | Freq: Every evening | ORAL | Status: DC | PRN
  Administered 2019-04-21: 100 mg via ORAL
  Filled 2019-04-20: qty 1

## 2019-04-20 MED ORDER — AMIODARONE IV BOLUS ONLY 150 MG/100ML
150.0000 mg | Freq: Once | INTRAVENOUS | Status: AC
  Administered 2019-04-20: 06:00:00 150 mg via INTRAVENOUS
  Filled 2019-04-20: qty 100

## 2019-04-20 MED ORDER — FENTANYL CITRATE (PF) 100 MCG/2ML IJ SOLN
25.0000 ug | Freq: Once | INTRAMUSCULAR | Status: AC
  Administered 2019-04-20: 02:00:00 25 ug via INTRAVENOUS
  Filled 2019-04-20: qty 2

## 2019-04-20 MED ORDER — HEPARIN SODIUM (PORCINE) 5000 UNIT/ML IJ SOLN
5000.0000 [IU] | Freq: Three times a day (TID) | INTRAMUSCULAR | Status: DC
  Administered 2019-04-20 – 2019-04-21 (×5): 5000 [IU] via SUBCUTANEOUS
  Filled 2019-04-20 (×5): qty 1

## 2019-04-20 MED ORDER — POTASSIUM CHLORIDE 10 MEQ/100ML IV SOLN
10.0000 meq | INTRAVENOUS | Status: AC
  Administered 2019-04-20 (×4): 10 meq via INTRAVENOUS
  Filled 2019-04-20 (×4): qty 100

## 2019-04-20 MED ORDER — ORAL CARE MOUTH RINSE
15.0000 mL | Freq: Two times a day (BID) | OROMUCOSAL | Status: DC
  Administered 2019-04-20 – 2019-04-25 (×11): 15 mL via OROMUCOSAL

## 2019-04-20 MED ORDER — STERILE WATER FOR INJECTION IV SOLN
INTRAVENOUS | Status: DC
  Administered 2019-04-20 – 2019-04-21 (×4): via INTRAVENOUS
  Filled 2019-04-20 (×8): qty 850

## 2019-04-20 MED ORDER — MIDAZOLAM HCL 2 MG/2ML IJ SOLN
2.0000 mg | INTRAMUSCULAR | Status: DC | PRN
  Administered 2019-04-20 (×3): 2 mg via INTRAVENOUS
  Filled 2019-04-20 (×2): qty 2

## 2019-04-20 MED ORDER — INSULIN GLARGINE 100 UNIT/ML ~~LOC~~ SOLN
21.0000 [IU] | Freq: Every day | SUBCUTANEOUS | Status: DC
  Administered 2019-04-20 – 2019-04-21 (×2): 21 [IU] via SUBCUTANEOUS
  Filled 2019-04-20 (×3): qty 0.21

## 2019-04-20 MED ORDER — METOPROLOL TARTRATE 5 MG/5ML IV SOLN
2.5000 mg | INTRAVENOUS | Status: DC | PRN
  Administered 2019-04-20: 07:00:00 2.5 mg via INTRAVENOUS

## 2019-04-20 MED ORDER — POTASSIUM PHOSPHATES 15 MMOLE/5ML IV SOLN
45.0000 mmol | Freq: Once | INTRAVENOUS | Status: AC
  Administered 2019-04-20: 05:00:00 45 mmol via INTRAVENOUS
  Filled 2019-04-20: qty 15

## 2019-04-20 MED ORDER — INSULIN ASPART 100 UNIT/ML ~~LOC~~ SOLN
0.0000 [IU] | SUBCUTANEOUS | Status: DC
  Administered 2019-04-20 (×2): 3 [IU] via SUBCUTANEOUS
  Administered 2019-04-20: 23:00:00 2 [IU] via SUBCUTANEOUS
  Administered 2019-04-21: 08:00:00 3 [IU] via SUBCUTANEOUS
  Administered 2019-04-21: 5 [IU] via SUBCUTANEOUS
  Filled 2019-04-20 (×5): qty 1

## 2019-04-20 MED ORDER — MIDAZOLAM HCL 2 MG/2ML IJ SOLN
INTRAMUSCULAR | Status: AC
  Administered 2019-04-20: 2 mg via INTRAVENOUS
  Filled 2019-04-20: qty 2

## 2019-04-20 MED ORDER — DIAZEPAM 5 MG/ML IJ SOLN
2.5000 mg | Freq: Once | INTRAMUSCULAR | Status: AC
  Administered 2019-04-20: 21:00:00 2.5 mg via INTRAVENOUS
  Filled 2019-04-20: qty 2

## 2019-04-20 MED ORDER — LORAZEPAM 2 MG/ML IJ SOLN
INTRAMUSCULAR | Status: AC
  Administered 2019-04-20: 02:00:00 1 mg via INTRAVENOUS
  Filled 2019-04-20: qty 1

## 2019-04-20 MED ORDER — LACTATED RINGERS IV SOLN
INTRAVENOUS | Status: DC
  Administered 2019-04-20: 05:00:00 via INTRAVENOUS

## 2019-04-20 MED ORDER — DEXMEDETOMIDINE HCL IN NACL 400 MCG/100ML IV SOLN
0.0000 ug/kg/h | INTRAVENOUS | Status: DC
  Administered 2019-04-20 (×2): 1.2 ug/kg/h via INTRAVENOUS
  Filled 2019-04-20: qty 100

## 2019-04-20 MED ORDER — INSULIN GLARGINE 100 UNIT/ML ~~LOC~~ SOLN
21.0000 [IU] | Freq: Every day | SUBCUTANEOUS | Status: DC
  Filled 2019-04-20: qty 0.21

## 2019-04-20 MED ORDER — VANCOMYCIN HCL IN DEXTROSE 750-5 MG/150ML-% IV SOLN
750.0000 mg | Freq: Once | INTRAVENOUS | Status: DC
  Administered 2019-04-20: 09:00:00 750 mg via INTRAVENOUS
  Filled 2019-04-20: qty 150

## 2019-04-20 MED ORDER — LACTATED RINGERS IV BOLUS
1000.0000 mL | INTRAVENOUS | Status: AC
  Administered 2019-04-20 (×2): 1000 mL via INTRAVENOUS

## 2019-04-20 MED ORDER — PANTOPRAZOLE SODIUM 40 MG IV SOLR: 40.0000 mg | INTRAVENOUS | Status: DC

## 2019-04-20 MED ORDER — INSULIN ASPART 100 UNIT/ML ~~LOC~~ SOLN: 0.0000 [IU] | SUBCUTANEOUS | Status: DC

## 2019-04-20 MED ORDER — LORAZEPAM 2 MG/ML IJ SOLN
1.0000 mg | Freq: Once | INTRAMUSCULAR | Status: AC
  Administered 2019-04-20: 02:00:00 1 mg via INTRAVENOUS

## 2019-04-20 MED ORDER — SODIUM BICARBONATE 8.4 % IV SOLN
100.0000 meq | Freq: Once | INTRAVENOUS | Status: AC
  Administered 2019-04-20: 100 meq via INTRAVENOUS

## 2019-04-20 MED ORDER — CHLORHEXIDINE GLUCONATE CLOTH 2 % EX PADS
6.0000 | MEDICATED_PAD | Freq: Every day | CUTANEOUS | Status: DC
  Administered 2019-04-20 – 2019-04-24 (×4): 6 via TOPICAL

## 2019-04-20 MED ORDER — PANTOPRAZOLE SODIUM 40 MG IV SOLR
40.0000 mg | INTRAVENOUS | Status: DC
  Administered 2019-04-20: 40 mg via INTRAVENOUS
  Filled 2019-04-20: qty 40

## 2019-04-20 MED ORDER — PIPERACILLIN-TAZOBACTAM 3.375 G IVPB
3.3750 g | Freq: Three times a day (TID) | INTRAVENOUS | Status: DC
  Administered 2019-04-20 – 2019-04-21 (×4): 3.375 g via INTRAVENOUS
  Filled 2019-04-20 (×4): qty 50

## 2019-04-20 NOTE — Consult Note (Signed)
Urology Consult  Chief Complaint: N/A  History of Present Illness: Kristy [name] is a 68 y.o. year old female seen at the request of Maura Crandall, NP for urinary retention and Foley catheter placement.  She was admitted to the ICU with diabetic ketoacidosis.  Her bladder was noted to be distended however attempts at Foley catheter placement in the ED and ICU were not successful with the catheter not advancing into the urethra.  Patient was unable to give a history and review of record negative for prior urologic problems.  Past Medical History:  Diagnosis Date   Allergic rhinitis    Allergy    Cataract    mild    Crohn's disease ([place]) 10/13/2013   Diverticulosis    GERD (gastroesophageal reflux disease)    Gout    HFrEF (heart failure with reduced ejection fraction) ([place])    a. 12/2008 Cath: EF 45% w/ inf HK; b. 07/2009 Echo: EF 50-55%; c. 08/2018 Echo: EF 20-25%.   Hyperlipidemia    Hypertension    IBS (irritable bowel syndrome)    Left bundle branch block    Neuromuscular disorder (HCC)    neuropathy   NICM (nonischemic cardiomyopathy) ([place])    a. 12/2008 Cath: no significant dzs, EF 45% w/ inf HK->Med Rx; b. 07/2009 Echo: EF 50-55%; c. 08/2018 Echo: EF 20-25%, ant/antsept HK, mild MR, mildly dil LA, nl RV fx; d. 08/2018 Cath: D1 80, otw nonobs dzs->Med rx.   Non-obstructive CAD (coronary artery disease)    a. 12/2008 Cath: no significant dzs, EF 45% w/ inf HK->Med Rx; b. 08/2018 Cath: LM nl, LAD min irregs, D1 80, LCX 28md, RCA min irregs->Med Rx.   Osteoarthritis    Poorly controlled Diabetes mellitus    a. 07/2018 A1c 13.1.   Symptomatic cholelithiasis     Past Surgical History:  Procedure Laterality Date   COLONOSCOPY     DILATION AND CURETTAGE OF UTERUS     RIGHT/LEFT HEART CATH AND CORONARY ANGIOGRAPHY N/A 08/26/2018   Procedure: RIGHT/LEFT HEART CATH AND CORONARY ANGIOGRAPHY;  Surgeon: AWellington Hampshire MD;  Location: [facility];  Service: Cardiovascular;  Laterality: N/A;   SHOULDER SURGERY     15 + yrs ago    SKIN SURGERY     nose    VAGINAL HYSTERECTOMY      Home Medications:  Current Meds  Medication Sig   acetaminophen (TYLENOL) 325 MG tablet Take 325 mg by mouth daily as needed for moderate pain or headache.   carvedilol (COREG) 6.25 MG tablet Take 1 tablet (6.25 mg total) by mouth 2 (two) times daily.   chlorpheniramine (CHLOR-TRIMETON) 4 MG tablet Take 4 mg by mouth daily as needed for allergies.   FLUoxetine (PROZAC) 20 MG capsule Take 1 capsule (20 mg total) by mouth daily.   insulin regular human CONCENTRATED (HUMULIN R U-500 KWIKPEN) 500 UNIT/ML kwikpen Inject 60-100 Units into the skin See admin instructions. Inject 100u under the skin at breakfast and lunch and inject 80u under the skin at dinner - inject 60u if eating a small / protein rich meal   Insulin Syringe-Needle U-100 (B-D INS SYRINGE 0.5CC/31GX5/16) 31G X 5/16" 0.5 ML MISC USE AS DIRECTED THREE TIMES A DAY   ivabradine (CORLANOR) 5 MG TABS tablet Take 1 tablet (5 mg total) by mouth 2 (two) times daily with a meal.   metroNIDAZOLE (METROGEL) 0.75 % gel Apply 1 application topically daily as needed (acne).   ONE  TOUCH ULTRA TEST test strip USE AS PER PACKAGE INSTRUCTIONS CHECK SUGARS UP TO-FOUR TIMES A DAY OR AS PRESCRIBED   Probiotic Product (ALIGN) 4 MG CAPS Take 4 mg by mouth daily.    sacubitril-valsartan (ENTRESTO) 49-51 MG Take 1 tablet (49/51 mg) by mouth 2 times daily starting on Sunday 09/15/18    Allergies:  Allergies  Allergen Reactions   Fish Oil Other (See Comments)    Gout   Glimepiride     REACTION: hypoglycemia   Guanfacine Hcl     REACTION: unspecified   Rosiglitazone Other (See Comments)    CHF    Family History  Problem Relation Age of Onset   Hypertension Father    Colon cancer Neg Hx    Colon polyps Neg Hx    Rectal cancer Neg Hx    Stomach cancer Neg Hx     Social History:   reports that she quit smoking about 21 years ago. Her smoking use included cigarettes. She has a 22.50 pack-year smoking history. She has never used smokeless tobacco. She reports that she does not drink alcohol or use drugs.  ROS: Unable to obtain Physical Exam:  Vital signs in last 24 hours: Temp:  [99.1 F (37.3 C)-101.2 F (38.4 C)] 99.1 F (37.3 C) (07/19 0000) Pulse Rate:  [144-160] 146 (07/19 0200) Resp:  [19-30] 23 (07/19 0200) BP: (96-133)/(55-82) 96/55 (07/19 0200) SpO2:  [90 %-100 %] 91 % (07/19 0200) Weight:  [69.8 kg-69.9 kg] 69.8 kg (07/18 2020) Constitutional: Obtunded HEENT: [place] AT, moist mucus membranes.  Trachea midline, no masses Cardiovascular: Regular rate and rhythm, no clubbing, cyanosis, or edema. Respiratory: Normal respiratory effort, lungs clear bilaterally GI: Abdomen is soft, bladder palpable GU: Urethral meatus normal in appearance and slightly edematous  Laboratory Data:  Recent Labs    04/19/19 1854  WBC 25.9*  HGB 16.1*  HCT 53.0*   Recent Labs    04/19/19 1854 04/20/19 0027  NA 130* 135  K 5.0 3.9  CL 86* 102  CO2 12* 12*  GLUCOSE 1,161* 816*  BUN 34* 39*  CREATININE 2.26* 1.98*  CALCIUM 9.7 9.2   Recent Labs    04/19/19 1854  INR 1.2   Patient was placed in the frog-leg position and prepped and draped.  A 16 [place] council catheter was placed over a 0.038 sensor wire.  The sensor wire was placed into the urethral meatus and advanced without difficulty.  The council catheter was advanced over the wire.  Resistance was met in the mid to proximal urethra then advanced into the bladder with return of clear urine.  The catheter was placed to gravity drainage.   Impression/Assessment:  Urinary retention secondary to female urethral stricture  Recommendation:  Leave Foley catheter indwelling at least 5 days.  04/20/2019, 3:00 AM  John Giovanni,  MD

## 2019-04-20 NOTE — Progress Notes (Addendum)
Pharmacy Electrolyte Monitoring Consult:  Pharmacy consulted to assist in monitoring and replacing electrolytes in this 68 y.o. female admitted on 04/19/2019 with Altered Mental Status   Labs:  Sodium (mmol/L)  Date Value  04/20/2019 145  10/16/2018 135   Potassium (mmol/L)  Date Value  04/20/2019 2.9 (L)   Magnesium (mg/dL)  Date Value  04/20/2019 2.2   Phosphorus (mg/dL)  Date Value  04/20/2019 3.3   Calcium (mg/dL)  Date Value  04/20/2019 8.8 (L)   Albumin (g/dL)  Date Value  04/19/2019 3.9    Assessment/Plan: Spoke w/ NP to try and do PO first, NP insisted on IV since patient is in DKA and is NPO. Then mentioned that patient's last K level was 3.9 mEq/L and that 60 mmol of Kphos (88 mEq of K), might overshoot her into hyperkalemia, NP insisted on KPhos since patient is on insulin drip and that will drive down K; then suggested if we could do 45 mmol of kphos first, which NP agrees to do 45 mmol of Kphos for now and will f/u w/ phos level @ 1200.   7/19 1447 K = 2.9  K still low @ 2.9, will give 4 runs of KCL 10 meq and recheck 1 hour after last dose @ [place], PharmD, BCPS Clinical Pharmacist 04/20/2019 5:58 PM      ;

## 2019-04-20 NOTE — Progress Notes (Signed)
Dr. Mortimer Fries aware of troponins this shift, no new orders at this time. Stated we would reevaluate tomorrow.

## 2019-04-20 NOTE — Progress Notes (Signed)
NP notified of lactic acid 4.6; acknowledged. No new orders at this time

## 2019-04-20 NOTE — Consult Note (Signed)
PULMONARY / CRITICAL CARE MEDICINE  Name: Kristy [name] MRN: [id number] DOB: 16-Oct-1950    LOS: 1  Referring Provider: Dr. Leslye Peer Reason for Referral: Severe DKA, sepsis of unknown source, urinary retention and acute encephalopathy Brief patient description: 68 year old female admitted with severe DKA, sepsis of unknown source, urinary retention and acute encephalopathy  HPI: This is a 68 year old Caucasian female with a medical history as indicated below who was brought to the ED by family after being found unresponsive.  History is obtained from ED records as patient is currently severely encephalopathic and not answering any questions.  Per ED records, daughter could not get hold of patient and hence decided to do a wellness check when she found patient on the floor next to the bed in the fetal position.  She also had some emesis and soda cans all over.  Patient had not been taking her medications and had also fallen a few x2 days prior.  At the ED her work-up revealed severe DKA with a blood glucose level greater than 1000, creatinine of 2.26, bun of 34, lactic acidosis with a lactic level of 4.6, and leukocytosis with a WBC of 25.9.  She is being admitted to the ICU for DKA, acute encephalopathy and sepsis of unknown source. She remains obtunded with occasional agitation.  Upon arrival in the ICU, patient's bladder was noted to be severely distended.  Several attempts have been made in the emergency room to place a Foley catheter without success.  Urology was consulted and a Foley catheter was placed and patient immediately put up 1000 cc of urine.  Past Medical History:  Diagnosis Date  . Allergic rhinitis   . Allergy   . Cataract    mild   . Crohn's disease ([place]) 10/13/2013  . Diverticulosis   . GERD (gastroesophageal reflux disease)   . Gout   . HFrEF (heart failure with reduced ejection fraction) ([place])    a. 12/2008 Cath: EF 45% w/ inf HK; b. 07/2009 Echo: EF 50-55%; c. 08/2018  Echo: EF 20-25%.  Marland Kitchen Hyperlipidemia   . Hypertension   . IBS (irritable bowel syndrome)   . Left bundle branch block   . Neuromuscular disorder (HCC)    neuropathy  . NICM (nonischemic cardiomyopathy) ([place])    a. 12/2008 Cath: no significant dzs, EF 45% w/ inf HK->Med Rx; b. 07/2009 Echo: EF 50-55%; c. 08/2018 Echo: EF 20-25%, ant/antsept HK, mild MR, mildly dil LA, nl RV fx; d. 08/2018 Cath: D1 80, otw nonobs dzs->Med rx.  . Non-obstructive CAD (coronary artery disease)    a. 12/2008 Cath: no significant dzs, EF 45% w/ inf HK->Med Rx; b. 08/2018 Cath: LM nl, LAD min irregs, D1 80, LCX 62md, RCA min irregs->Med Rx.  . Osteoarthritis   . Poorly controlled Diabetes mellitus    a. 07/2018 A1c 13.1.  .Marland KitchenSymptomatic cholelithiasis    Past Surgical History:  Procedure Laterality Date  . COLONOSCOPY    . DILATION AND CURETTAGE OF UTERUS    . RIGHT/LEFT HEART CATH AND CORONARY ANGIOGRAPHY N/A 08/26/2018   Procedure: RIGHT/LEFT HEART CATH AND CORONARY ANGIOGRAPHY;  Surgeon: AWellington Hampshire MD;  [facility];  Service: Cardiovascular;  Laterality: N/A;  . SHOULDER SURGERY     15 + yrs ago   . SKIN SURGERY     nose   . VAGINAL HYSTERECTOMY     No current facility-administered medications on file prior to encounter.    Current Outpatient Medications on File  Prior to Encounter  Medication Sig  . acetaminophen (TYLENOL) 325 MG tablet Take 325 mg by mouth daily as needed for moderate pain or headache.  . carvedilol (COREG) 6.25 MG tablet Take 1 tablet (6.25 mg total) by mouth 2 (two) times daily.  . chlorpheniramine (CHLOR-TRIMETON) 4 MG tablet Take 4 mg by mouth daily as needed for allergies.  Marland Kitchen FLUoxetine (PROZAC) 20 MG capsule Take 1 capsule (20 mg total) by mouth daily.  . insulin regular human CONCENTRATED (HUMULIN R U-500 KWIKPEN) 500 UNIT/ML kwikpen Inject 60-100 Units into the skin See admin instructions. Inject 100u under the skin at breakfast and lunch and inject 80u  under the skin at dinner - inject 60u if eating a small / protein rich meal  . Insulin Syringe-Needle U-100 (B-D INS SYRINGE 0.5CC/31GX5/16) 31G X 5/16" 0.5 ML MISC USE AS DIRECTED THREE TIMES A DAY  . ivabradine (CORLANOR) 5 MG TABS tablet Take 1 tablet (5 mg total) by mouth 2 (two) times daily with a meal.  . metroNIDAZOLE (METROGEL) 0.75 % gel Apply 1 application topically daily as needed (acne).  . ONE TOUCH ULTRA TEST test strip USE AS PER PACKAGE INSTRUCTIONS CHECK SUGARS UP TO-FOUR TIMES A DAY OR AS PRESCRIBED  . Probiotic Product (ALIGN) 4 MG CAPS Take 4 mg by mouth daily.   . sacubitril-valsartan (ENTRESTO) 49-51 MG Take 1 tablet (49/51 mg) by mouth 2 times daily starting on Sunday 09/15/18  . NONFORMULARY OR COMPOUNDED ITEM See pharmacy note (Patient taking differently: Apply 1 application topically at bedtime as needed (neuropathy). Peripheral Neuropathy Cream-  Bupivacaine 1%, Doxepin 3%, Gabapentin 6%, Pentoxifylline 3%, Topiramate 1%  Apply 1-2 grams to affected area 3-4 times daily  Qty. 120 gm)    Allergies Allergies  Allergen Reactions  . Fish Oil Other (See Comments)    Gout  . Glimepiride     REACTION: hypoglycemia  . Guanfacine Hcl     REACTION: unspecified  . Rosiglitazone Other (See Comments)    CHF    Family History Family History  Problem Relation Age of Onset  . Hypertension Father   . Colon cancer Neg Hx   . Colon polyps Neg Hx   . Rectal cancer Neg Hx   . Stomach cancer Neg Hx    Social History  reports that she quit smoking about 21 years ago. Her smoking use included cigarettes. She has a 22.50 pack-year smoking history. She has never used smokeless tobacco. She reports that she does not drink alcohol or use drugs.  Review Of Systems: Unable to obtain as patient is currently obtunded  VITAL SIGNS: BP 111/66   Pulse (!) 153   Temp 99.1 F (37.3 C) (Axillary)   Resp 19   Ht 5' 0.5" (1.537 m)   Wt 69.8 kg   SpO2 92%   BMI 29.58 kg/m    HEMODYNAMICS:    VENTILATOR SETTINGS:    INTAKE / OUTPUT: No intake/output data recorded.  PHYSICAL EXAMINATION: General: Acutely ill looking, in moderate distress HEENT: Normocephalic and atraumatic, PERRLA, trachea midline, no JVD Neuro: Withdraws to noxious stimulus, unable to follow commands, moves all extremities Cardiovascular: Apical pulse regular, tachycardic in the 150s, S1-S2, no murmur regurg or gallop, +2 pulses bilaterally, no edema Lungs: Bilateral breath sounds without any wheezes or rhonchi, diminished in the bases Abdomen: Mildly distended with bladder extending to the level of the umbilicus, positive bowel sounds Musculoskeletal: No joint deformities, positive range of motion Skin: Poor skin turgor, warm and dry  LABS:  BMET Recent Labs  Lab 04/19/19 1854  NA 130*  K 5.0  CL 86*  CO2 12*  BUN 34*  CREATININE 2.26*  GLUCOSE 1,161*    Electrolytes Recent Labs  Lab 04/19/19 1854  CALCIUM 9.7    CBC Recent Labs  Lab 04/19/19 1854  WBC 25.9*  HGB 16.1*  HCT 53.0*  PLT 266    Coag's Recent Labs  Lab 04/19/19 1854  APTT 31  INR 1.2    Sepsis Markers Recent Labs  Lab 04/19/19 1854  LATICACIDVEN <0.3*    ABG No results for input(s): PHART, PCO2ART, PO2ART in the last 168 hours.  Liver Enzymes Recent Labs  Lab 04/19/19 1854  AST 45*  ALT 26  ALKPHOS 146*  BILITOT 1.9*  ALBUMIN 3.9    Cardiac Enzymes No results for input(s): TROPONINI, PROBNP in the last 168 hours.  Glucose Recent Labs  Lab 04/19/19 2054 04/19/19 2203 04/20/19 0023  GLUCAP >600* >600* >600*    Imaging Ct Head Wo Contrast  Result Date: 04/19/2019 CLINICAL DATA:  Altered level of consciousness (LOC), unexplained AMS; Neck pain, chronic, abn neuro exam, neg xray AMS. Found unresponsive. EXAM: CT HEAD WITHOUT CONTRAST CT CERVICAL SPINE WITHOUT CONTRAST TECHNIQUE: Multidetector CT imaging of the head and cervical spine was performed following the  standard protocol without intravenous contrast. Multiplanar CT image reconstructions of the cervical spine were also generated. COMPARISON:  Head CT 10/30/2018 FINDINGS: CT HEAD FINDINGS Brain: No intracranial hemorrhage, mass effect, or midline shift. No hydrocephalus. The basilar cisterns are patent. No evidence of territorial infarct or acute ischemia. Unchanged degree of atrophy and mild chronic small vessel ischemia. No extra-axial or intracranial fluid collection. Vascular: No hyperdense vessel. Skull: No fracture or focal lesion. Sinuses/Orbits: Paranasal sinuses and mastoid air cells are clear. The visualized orbits are unremarkable. Other: None. CT CERVICAL SPINE FINDINGS Alignment: Trace anterolisthesis of C4 on C5 is likely facet mediated. Alignment otherwise maintained. Skull base and vertebrae: No acute fracture. Vertebral body heights are maintained. The dens and skull base are intact. Incidental vertebral body hemangioma within C4. Soft tissues and spinal canal: No prevertebral fluid or swelling. No visible canal hematoma. Disc levels: Disc space narrowing and endplate spurring at D9-M4 and C6-C7. Multilevel facet hypertrophy. No significant spinal canal stenosis. Bilateral neural foraminal stenosis at C5-C6 and right neural foraminal stenosis at C6-C7. Upper chest: 11 mm right thyroid nodule. No dedicated imaging follow-up is needed due to size. Other: None. IMPRESSION: 1. No acute intracranial abnormality. Mild atrophy and chronic small vessel ischemia, similar to prior exam. 2. Multilevel degenerative change in the cervical spine without acute abnormality. Electronically Signed   By: Keith Rake M.D.   On: 04/19/2019 19:41   Ct Cervical Spine Wo Contrast  Result Date: 04/19/2019 CLINICAL DATA:  Altered level of consciousness (LOC), unexplained AMS; Neck pain, chronic, abn neuro exam, neg xray AMS. Found unresponsive. EXAM: CT HEAD WITHOUT CONTRAST CT CERVICAL SPINE WITHOUT CONTRAST  TECHNIQUE: Multidetector CT imaging of the head and cervical spine was performed following the standard protocol without intravenous contrast. Multiplanar CT image reconstructions of the cervical spine were also generated. COMPARISON:  Head CT 10/30/2018 FINDINGS: CT HEAD FINDINGS Brain: No intracranial hemorrhage, mass effect, or midline shift. No hydrocephalus. The basilar cisterns are patent. No evidence of territorial infarct or acute ischemia. Unchanged degree of atrophy and mild chronic small vessel ischemia. No extra-axial or intracranial fluid collection. Vascular: No hyperdense vessel. Skull: No fracture or focal lesion. Sinuses/Orbits:  Paranasal sinuses and mastoid air cells are clear. The visualized orbits are unremarkable. Other: None. CT CERVICAL SPINE FINDINGS Alignment: Trace anterolisthesis of C4 on C5 is likely facet mediated. Alignment otherwise maintained. Skull base and vertebrae: No acute fracture. Vertebral body heights are maintained. The dens and skull base are intact. Incidental vertebral body hemangioma within C4. Soft tissues and spinal canal: No prevertebral fluid or swelling. No visible canal hematoma. Disc levels: Disc space narrowing and endplate spurring at J8-I3 and C6-C7. Multilevel facet hypertrophy. No significant spinal canal stenosis. Bilateral neural foraminal stenosis at C5-C6 and right neural foraminal stenosis at C6-C7. Upper chest: 11 mm right thyroid nodule. No dedicated imaging follow-up is needed due to size. Other: None. IMPRESSION: 1. No acute intracranial abnormality. Mild atrophy and chronic small vessel ischemia, similar to prior exam. 2. Multilevel degenerative change in the cervical spine without acute abnormality. Electronically Signed   By: Keith Rake M.D.   On: 04/19/2019 19:41   Dg Chest Port 1 View  Result Date: 04/19/2019 CLINICAL DATA:  Altered mental status. EXAM: PORTABLE CHEST 1 VIEW COMPARISON:  October 30, 2018 FINDINGS: The heart size and  mediastinal contours are within normal limits. Both lungs are clear. The visualized skeletal structures are unremarkable. IMPRESSION: No active disease. Electronically Signed   By: Constance Holster M.D.   On: 04/19/2019 19:42    STUDIES:  None  CULTURES: Urine culture Blood cultures x2 ANTIBIOTICS: Cefepime Flagyl Vancomycin  SIGNIFICANT EVENTS: 04/19/2019: Admitted  LINES/TUBES: Foley catheter Peripheral IVs  DISCUSSION: 68 year old female with a known history of uncontrolled type 2 diabetes, CAD, CHF, hypertension and left bundle branch block presenting with severe DKA, lactic acidosis, severe metabolic encephalopathy, and sepsis of unknown source  ASSESSMENT Severe DKA Sepsis of unknown source Acute urinary retention Severe metabolic encephalopathy Sinus tachycardia with heart rate in the 150s Lactic acidosis Acute renal failure Hypophosphatemia Elevated lipase and amylase  PLAN Hemodynamic monitoring per ICU protocol DKA protocol Broad-spectrum antibiotics Trend procalcitonin and adjust antibiotics Trend LFTs and consider CT abdomen or ultrasound once patient is stable Follow-up cultures Monitor and correct electrolytes Trend creatinine and avoid nephrotoxic drugs Metoprolol 2.5 mg every 2 hours PRN for heart rate greater than 130 Serial EKGs and  troponin Foley placed by urology, monitor strict I's and O's  Best Practice: Code Status:Full code Diet: N.p.o. GI prophylaxis: Protonix VTE prophylaxis: Subcu heparin and SCDs  FAMILY  - Updates: Family updated by ED and admitting attending.  Will update with any new changes in treatment plan  Magdalene S. Tukov-YUAL ANP-BC Pulmonary and [place] Pager [phone] or [phone]  NB: This document was prepared using Dragon voice recognition software and may include unintentional dictation errors.    04/20/2019, 12:37 AM

## 2019-04-20 NOTE — Progress Notes (Signed)
Pharmacy Electrolyte Monitoring Consult:  Pharmacy consulted to assist in monitoring and replacing electrolytes in this 68 y.o. female admitted on 04/19/2019 with Altered Mental Status   Labs:  Sodium (mmol/L)  Date Value  04/20/2019 135  10/16/2018 135   Potassium (mmol/L)  Date Value  04/20/2019 3.9   Magnesium (mg/dL)  Date Value  04/20/2019 2.2   Phosphorus (mg/dL)  Date Value  04/20/2019 <1.0 (LL)   Calcium (mg/dL)  Date Value  04/20/2019 9.2   Albumin (g/dL)  Date Value  04/19/2019 3.9    Assessment/Plan: Spoke w/ NP to try and do PO first, NP insisted on IV since patient is in DKA and is NPO. Then mentioned that patient's last K level was 3.9 mEq/L and that 60 mmol of Kphos (88 mEq of K), might overshoot her into hyperkalemia, NP insisted on KPhos since patient is on insulin drip and that will drive down K; then suggested if we could do 45 mmol of kphos first, which NP agrees to do 45 mmol of Kphos for now and will f/u w/ phos level @ 1200.   Tobie Lords, PharmD, BCPS Clinical Pharmacist 04/20/2019 4:03 AM                          ;

## 2019-04-20 NOTE — Progress Notes (Signed)
Pharmacy Antibiotic Note  Kristy [name] is a 68 y.o. female admitted on 04/19/2019 with sepsis.  Pharmacy has been consulted for vanc/cefepime dosing w/o h/o CKD or DM, but w/ NICM. Patient admitted for being unresponsive and AMS. CT head negative, CT spine negative, CXR no active disease or cardiomegaly, EKG showing sinus tach w/ wide complexes. Meets 3/4 SIRS criteria and 1/3 qSOFA, w/ BG of 1161, pH of < 7.19 on VBG, w/ gap anion met acidosis, and beta-hydroxy in urine, osm of 372 and a pseudohyponatremia.   Plan: 07/19 @ 0500 VR 22 mcg/mL. Patient's renal function is improving therefore, will give a vanc 750 mg IV x 1 and will recheck VR w/ am labs.  Height: 5' 0.5" (153.7 cm) Weight: 153 lb 15.9 oz (69.8 kg) IBW/kg (Calculated) : 46.65  Temp (24hrs), Avg:100 F (37.8 C), Min:98.5 F (36.9 C), Max:101.2 F (38.4 C)  Recent Labs  Lab 04/19/19 1854 04/20/19 0027 04/20/19 0414  WBC 25.9*  --  28.4*  CREATININE 2.26* 1.98* 1.51*  LATICACIDVEN <0.3* 4.6*  --   VANCORANDOM  --   --  22    Estimated Creatinine Clearance: 32 mL/min (A) (by C-G formula based on SCr of 1.51 mg/dL (H)).    Allergies  Allergen Reactions  . Fish Oil Other (See Comments)    Gout  . Glimepiride     REACTION: hypoglycemia  . Guanfacine Hcl     REACTION: unspecified  . Rosiglitazone Other (See Comments)    CHF    Thank you for allowing pharmacy to be a part of this patient's care.  Tobie Lords, PharmD, BCPS Clinical Pharmacist 04/20/2019 7:26 AM

## 2019-04-20 NOTE — Progress Notes (Signed)
[facility] [place] NAME: Kristy [name]    MR#:  [id number]  DATE OF BIRTH:  09-06-1951  SUBJECTIVE:  CHIEF COMPLAINT:   Chief Complaint  Patient presents with  . Altered Mental Status   Came with altered mental status and noted to be in DKA with elevated troponin and lipase. Remains confused REVIEW OF SYSTEMS:   Due to AMS , can not give ROS.  ROS  DRUG ALLERGIES:   Allergies  Allergen Reactions  . Fish Oil Other (See Comments)    Gout  . Glimepiride     REACTION: hypoglycemia  . Guanfacine Hcl     REACTION: unspecified  . Rosiglitazone Other (See Comments)    CHF    VITALS:  Blood pressure 94/76, pulse (!) 116, temperature 98.2 F (36.8 C), temperature source Oral, resp. rate (!) 31, height 5' 0.5" (1.537 m), weight 69.8 kg, SpO2 96 %.  PHYSICAL EXAMINATION:   GENERAL:  68 y.o.-year-old patient lying in the bed with shallow breaths EYES: Pupils equal, round, reactive to light and accommodation. No scleral icterus.  HEENT: Head atraumatic, normocephalic. Oropharynx and nasopharynx clear.  NECK:  Supple, no jugular venous distention. No thyroid enlargement, no tenderness.  LUNGS: Normal breath sounds bilaterally, no wheezing, rales,rhonchi or crepitation. No use of accessory muscles of respiration.  CARDIOVASCULAR: S1, S2 normal. No murmurs, rubs, or gallops.  ABDOMEN: Soft, nontender, nondistended. Bowel sounds present. No organomegaly or mass.  EXTREMITIES: No pedal edema, cyanosis, or clubbing.  NEUROLOGIC: Patient moving all of her extremities on her own, not following commands. PSYCHIATRIC: The patient is lethargic.  SKIN: No rash, lesion, or ulcer.   Physical Exam LABORATORY PANEL:   CBC Recent Labs  Lab 04/20/19 0414  WBC 28.4*  HGB 14.3  HCT 42.1  PLT 205   ------------------------------------------------------------------------------------------------------------------  Chemistries  Recent Labs   Lab 04/19/19 1854 04/20/19 0027  04/20/19 0850  NA 130* 135   < > 145  K 5.0 3.9   < > 3.1*  CL 86* 102   < > 104  CO2 12* 12*   < > 28  GLUCOSE 1,161* 816*   < > 238*  BUN 34* 39*   < > 30*  CREATININE 2.26* 1.98*   < > 1.32*  CALCIUM 9.7 9.2   < > 8.8*  MG  --  2.2  --   --   AST 45*  --   --   --   ALT 26  --   --   --   ALKPHOS 146*  --   --   --   BILITOT 1.9*  --   --   --    < > = values in this interval not displayed.   ------------------------------------------------------------------------------------------------------------------  Cardiac Enzymes No results for input(s): TROPONINI in the last 168 hours. ------------------------------------------------------------------------------------------------------------------  RADIOLOGY:  Ct Head Wo Contrast  Result Date: 04/19/2019 CLINICAL DATA:  Altered level of consciousness (LOC), unexplained AMS; Neck pain, chronic, abn neuro exam, neg xray AMS. Found unresponsive. EXAM: CT HEAD WITHOUT CONTRAST CT CERVICAL SPINE WITHOUT CONTRAST TECHNIQUE: Multidetector CT imaging of the head and cervical spine was performed following the standard protocol without intravenous contrast. Multiplanar CT image reconstructions of the cervical spine were also generated. COMPARISON:  Head CT 10/30/2018 FINDINGS: CT HEAD FINDINGS Brain: No intracranial hemorrhage, mass effect, or midline shift. No hydrocephalus. The basilar cisterns are patent. No evidence of territorial infarct or acute ischemia. Unchanged degree of  atrophy and mild chronic small vessel ischemia. No extra-axial or intracranial fluid collection. Vascular: No hyperdense vessel. Skull: No fracture or focal lesion. Sinuses/Orbits: Paranasal sinuses and mastoid air cells are clear. The visualized orbits are unremarkable. Other: None. CT CERVICAL SPINE FINDINGS Alignment: Trace anterolisthesis of C4 on C5 is likely facet mediated. Alignment otherwise maintained. Skull base and vertebrae: No  acute fracture. Vertebral body heights are maintained. The dens and skull base are intact. Incidental vertebral body hemangioma within C4. Soft tissues and spinal canal: No prevertebral fluid or swelling. No visible canal hematoma. Disc levels: Disc space narrowing and endplate spurring at B3-A1 and C6-C7. Multilevel facet hypertrophy. No significant spinal canal stenosis. Bilateral neural foraminal stenosis at C5-C6 and right neural foraminal stenosis at C6-C7. Upper chest: 11 mm right thyroid nodule. No dedicated imaging follow-up is needed due to size. Other: None. IMPRESSION: 1. No acute intracranial abnormality. Mild atrophy and chronic small vessel ischemia, similar to prior exam. 2. Multilevel degenerative change in the cervical spine without acute abnormality. Electronically Signed   By: Keith Rake M.D.   On: 04/19/2019 19:41   Ct Cervical Spine Wo Contrast  Result Date: 04/19/2019 CLINICAL DATA:  Altered level of consciousness (LOC), unexplained AMS; Neck pain, chronic, abn neuro exam, neg xray AMS. Found unresponsive. EXAM: CT HEAD WITHOUT CONTRAST CT CERVICAL SPINE WITHOUT CONTRAST TECHNIQUE: Multidetector CT imaging of the head and cervical spine was performed following the standard protocol without intravenous contrast. Multiplanar CT image reconstructions of the cervical spine were also generated. COMPARISON:  Head CT 10/30/2018 FINDINGS: CT HEAD FINDINGS Brain: No intracranial hemorrhage, mass effect, or midline shift. No hydrocephalus. The basilar cisterns are patent. No evidence of territorial infarct or acute ischemia. Unchanged degree of atrophy and mild chronic small vessel ischemia. No extra-axial or intracranial fluid collection. Vascular: No hyperdense vessel. Skull: No fracture or focal lesion. Sinuses/Orbits: Paranasal sinuses and mastoid air cells are clear. The visualized orbits are unremarkable. Other: None. CT CERVICAL SPINE FINDINGS Alignment: Trace anterolisthesis of C4 on C5  is likely facet mediated. Alignment otherwise maintained. Skull base and vertebrae: No acute fracture. Vertebral body heights are maintained. The dens and skull base are intact. Incidental vertebral body hemangioma within C4. Soft tissues and spinal canal: No prevertebral fluid or swelling. No visible canal hematoma. Disc levels: Disc space narrowing and endplate spurring at P3-X9 and C6-C7. Multilevel facet hypertrophy. No significant spinal canal stenosis. Bilateral neural foraminal stenosis at C5-C6 and right neural foraminal stenosis at C6-C7. Upper chest: 11 mm right thyroid nodule. No dedicated imaging follow-up is needed due to size. Other: None. IMPRESSION: 1. No acute intracranial abnormality. Mild atrophy and chronic small vessel ischemia, similar to prior exam. 2. Multilevel degenerative change in the cervical spine without acute abnormality. Electronically Signed   By: Keith Rake M.D.   On: 04/19/2019 19:41   Mr Brain Wo Contrast  Result Date: 04/20/2019 CLINICAL DATA:  Recent fall. Altered mental status. Negative CT evaluation. EXAM: MRI HEAD WITHOUT CONTRAST TECHNIQUE: Multiplanar, multiecho pulse sequences of the brain and surrounding structures were obtained without intravenous contrast. COMPARISON:  CT 04/19/2019 FINDINGS: Brain: Diffusion imaging does not show any acute or subacute infarction. The brainstem and cerebellum are normal. Cerebral hemispheres show mild chronic small-vessel ischemic change of the white matter. No cortical or large vessel territory infarction. No mass lesion, hemorrhage, hydrocephalus or extra-axial collection. Vascular: Major vessels at the base of the brain show flow. Skull and upper cervical spine: Negative Sinuses/Orbits: Clear/normal Other: None  IMPRESSION: No acute or significant finding. Mild chronic small-vessel ischemic change of the hemispheric white matter, often seen at this age. Electronically Signed   By: Nelson Chimes M.D.   On: 04/20/2019 14:32    Dg Chest Port 1 View  Result Date: 04/19/2019 CLINICAL DATA:  Altered mental status. EXAM: PORTABLE CHEST 1 VIEW COMPARISON:  October 30, 2018 FINDINGS: The heart size and mediastinal contours are within normal limits. Both lungs are clear. The visualized skeletal structures are unremarkable. IMPRESSION: No active disease. Electronically Signed   By: Constance Holster M.D.   On: 04/19/2019 19:42    ASSESSMENT AND PLAN:   Active Problems:   DKA (diabetic ketoacidoses) ([place])   Pneumonia   Sepsis ([place])  1.  Diabetic ketoacidosis.  Placed on insulin drip.   Serial BMPs.   Manage per ICU team. 2.  Acute encephalopathy.  Could be secondary to diabetic ketoacidosis or sepsis.  CT scan of the head negative.  Continue to monitor. 3.  Clinical sepsis.  Unclear cause.  Patient has fever and leukocytosis and tachycardia.  Empiric antibiotics.  Follow-up cultures.  Follow-up urine. Might do CT once pt is stable. 4.  Wide-complex tachycardia.  Patient has had a history of left bundle branch block.  Has seen [facility] cardiology in the past if needed.   Have High troponin, but as per intencivist- likely due to Sepsis and as troponin stable, cont to monitor. 5.  History of nonischemic cardiomyopathy.  No signs of congestive heart failure. 6.  Hypertension 7.  Hyperlipidemia unspecified 8.  GERD 9.  History of Crohn's disease   All the records are reviewed and case discussed with Care Management/Social Workerr. Management plans discussed with the patient, family and they are in agreement.  CODE STATUS: Full.  TOTAL TIME TAKING CARE OF THIS PATIENT: 35 minutes.    POSSIBLE D/C IN 1-2 DAYS, DEPENDING ON CLINICAL CONDITION.   Vaughan Basta M.D on 04/20/2019   Between 7am to 6pm - Pager - [phone]  After 6pm go to [email or web address] - password [email or web address] [facility]  Office  [phone]  CC: Primary care physician; Owens Loffler, MD  Note: This dictation was  prepared with Dragon dictation along with smaller phrase technology. Any transcriptional errors that result from this process are unintentional.

## 2019-04-21 ENCOUNTER — Inpatient Hospital Stay: Payer: Medicare HMO

## 2019-04-21 ENCOUNTER — Other Ambulatory Visit: Payer: Medicare HMO

## 2019-04-21 ENCOUNTER — Inpatient Hospital Stay (HOSPITAL_COMMUNITY)
Admit: 2019-04-21 | Discharge: 2019-04-21 | Disposition: A | Discharge status: Home / Self Care | Payer: Medicare HMO | Attending: Adult Health | Admitting: Adult Health

## 2019-04-21 DIAGNOSIS — R7989 Other specified abnormal findings of blood chemistry: Secondary | ICD-10-CM

## 2019-04-21 DIAGNOSIS — R488 Other symbolic dysfunctions: Secondary | ICD-10-CM

## 2019-04-21 DIAGNOSIS — R778 Other specified abnormalities of plasma proteins: Secondary | ICD-10-CM

## 2019-04-21 DIAGNOSIS — E0810 Diabetes mellitus due to underlying condition with ketoacidosis without coma: Secondary | ICD-10-CM

## 2019-04-21 DIAGNOSIS — E876 Hypokalemia: Secondary | ICD-10-CM

## 2019-04-21 DIAGNOSIS — E873 Alkalosis: Secondary | ICD-10-CM

## 2019-04-21 DIAGNOSIS — I5023 Acute on chronic systolic (congestive) heart failure: Secondary | ICD-10-CM

## 2019-04-21 LAB — ECHOCARDIOGRAM COMPLETE
Height: 60.5 in
Weight: 2463.86 oz

## 2019-04-21 LAB — BASIC METABOLIC PANEL
Anion gap: 11 (ref 5–15)
Anion gap: 12 (ref 5–15)
Anion gap: 13 (ref 5–15)
BUN: 26 mg/dL — ABNORMAL HIGH (ref 8–23)
BUN: 29 mg/dL — ABNORMAL HIGH (ref 8–23)
BUN: 31 mg/dL — ABNORMAL HIGH (ref 8–23)
CO2: 31 mmol/L (ref 22–32)
CO2: 34 mmol/L — ABNORMAL HIGH (ref 22–32)
CO2: 37 mmol/L — ABNORMAL HIGH (ref 22–32)
Calcium: 7.8 mg/dL — ABNORMAL LOW (ref 8.9–10.3)
Calcium: 8 mg/dL — ABNORMAL LOW (ref 8.9–10.3)
Calcium: 8.1 mg/dL — ABNORMAL LOW (ref 8.9–10.3)
Chloride: 97 mmol/L — ABNORMAL LOW (ref 98–111)
Chloride: 99 mmol/L (ref 98–111)
Chloride: 99 mmol/L (ref 98–111)
Creatinine, Ser: 1.42 mg/dL — ABNORMAL HIGH (ref 0.44–1.00)
Creatinine, Ser: 1.51 mg/dL — ABNORMAL HIGH (ref 0.44–1.00)
Creatinine, Ser: 1.71 mg/dL — ABNORMAL HIGH (ref 0.44–1.00)
GFR calc Af Amer: 35 mL/min — ABNORMAL LOW (ref >60)
GFR calc Af Amer: 41 mL/min — ABNORMAL LOW (ref >60)
GFR calc Af Amer: 44 mL/min — ABNORMAL LOW (ref >60)
GFR calc non Af Amer: 30 mL/min — ABNORMAL LOW (ref >60)
GFR calc non Af Amer: 35 mL/min — ABNORMAL LOW (ref >60)
GFR calc non Af Amer: 38 mL/min — ABNORMAL LOW (ref >60)
Glucose, Bld: 187 mg/dL — ABNORMAL HIGH (ref 70–99)
Glucose, Bld: 208 mg/dL — ABNORMAL HIGH (ref 70–99)
Glucose, Bld: 297 mg/dL — ABNORMAL HIGH (ref 70–99)
Potassium: 2.8 mmol/L — ABNORMAL LOW (ref 3.5–5.1)
Potassium: 3.3 mmol/L — ABNORMAL LOW (ref 3.5–5.1)
Potassium: 3.7 mmol/L (ref 3.5–5.1)
Sodium: 143 mmol/L (ref 135–145)
Sodium: 145 mmol/L (ref 135–145)
Sodium: 145 mmol/L (ref 135–145)

## 2019-04-21 LAB — TROPONIN I (HIGH SENSITIVITY): Troponin I (High Sensitivity): 2950 ng/L (ref <18)

## 2019-04-21 LAB — CBC
HCT: 39.3 % (ref 36.0–46.0)
Hemoglobin: 12.8 g/dL (ref 12.0–15.0)
MCH: 30 pg (ref 26.0–34.0)
MCHC: 32.6 g/dL (ref 30.0–36.0)
MCV: 92 fL (ref 80.0–100.0)
Platelets: 139 10*3/uL — ABNORMAL LOW (ref 150–400)
RBC: 4.27 MIL/uL (ref 3.87–5.11)
RDW: 14 % (ref 11.5–15.5)
WBC: 18.4 10*3/uL — ABNORMAL HIGH (ref 4.0–10.5)
nRBC: 0 % (ref 0.0–0.2)

## 2019-04-21 LAB — URINE CULTURE: Culture: NO GROWTH

## 2019-04-21 LAB — GLUCOSE, CAPILLARY
Glucose-Capillary: 219 mg/dL — ABNORMAL HIGH (ref 70–99)
Glucose-Capillary: 196 mg/dL — ABNORMAL HIGH (ref 70–99)
Glucose-Capillary: 222 mg/dL — ABNORMAL HIGH (ref 70–99)
Glucose-Capillary: 289 mg/dL — ABNORMAL HIGH (ref 70–99)
Glucose-Capillary: 292 mg/dL — ABNORMAL HIGH (ref 70–99)

## 2019-04-21 LAB — PROCALCITONIN: Procalcitonin: 0.43 ng/mL

## 2019-04-21 LAB — AMYLASE: Amylase: 303 U/L — ABNORMAL HIGH (ref 28–100)

## 2019-04-21 LAB — LACTIC ACID, PLASMA: Lactic Acid, Venous: 3.4 mmol/L (ref 0.5–1.9)

## 2019-04-21 LAB — LIPASE, BLOOD: Lipase: 59 U/L — ABNORMAL HIGH (ref 11–51)

## 2019-04-21 LAB — HEMOGLOBIN A1C
Hgb A1c MFr Bld: >15.5 % — ABNORMAL HIGH (ref 4.8–5.6)
Mean Plasma Glucose: >398 mg/dL

## 2019-04-21 LAB — PHOSPHORUS: Phosphorus: 2.1 mg/dL — ABNORMAL LOW (ref 2.5–4.6)

## 2019-04-21 LAB — MAGNESIUM: Magnesium: 1.8 mg/dL (ref 1.7–2.4)

## 2019-04-21 MED ORDER — FAMOTIDINE 20 MG PO TABS
20.0000 mg | ORAL_TABLET | Freq: Every day | ORAL | Status: DC
  Administered 2019-04-21 – 2019-04-28 (×8): 20 mg via ORAL
  Filled 2019-04-21 (×8): qty 1

## 2019-04-21 MED ORDER — POTASSIUM CHLORIDE 20 MEQ PO PACK: 40.0000 meq | PACK | ORAL | Status: DC

## 2019-04-21 MED ORDER — ASPIRIN EC 81 MG PO TBEC
81.0000 mg | DELAYED_RELEASE_TABLET | Freq: Every day | ORAL | Status: DC
  Administered 2019-04-22 – 2019-04-28 (×6): 81 mg via ORAL
  Filled 2019-04-21 (×7): qty 1

## 2019-04-21 MED ORDER — ATORVASTATIN CALCIUM 80 MG PO TABS
80.0000 mg | ORAL_TABLET | Freq: Every day | ORAL | Status: DC
  Administered 2019-04-21 – 2019-04-27 (×7): 80 mg via ORAL
  Filled 2019-04-21 (×6): qty 1
  Filled 2019-04-21: qty 4

## 2019-04-21 MED ORDER — ALPRAZOLAM 0.25 MG PO TABS
0.2500 mg | ORAL_TABLET | ORAL | Status: AC
  Administered 2019-04-21: 20:00:00 0.25 mg via ORAL
  Filled 2019-04-21: qty 1

## 2019-04-21 MED ORDER — POTASSIUM CHLORIDE 10 MEQ/100ML IV SOLN
10.0000 meq | INTRAVENOUS | Status: DC
  Administered 2019-04-21: 02:00:00 10 meq via INTRAVENOUS
  Filled 2019-04-21 (×5): qty 100

## 2019-04-21 MED ORDER — POTASSIUM CHLORIDE 2 MEQ/ML IV SOLN
INTRAVENOUS | Status: DC
  Administered 2019-04-21: 14:00:00 via INTRAVENOUS
  Filled 2019-04-21: qty 1000

## 2019-04-21 MED ORDER — CARVEDILOL 3.125 MG PO TABS
3.1250 mg | ORAL_TABLET | Freq: Two times a day (BID) | ORAL | Status: DC
  Administered 2019-04-22 – 2019-04-27 (×11): 3.125 mg via ORAL
  Filled 2019-04-21 (×12): qty 1

## 2019-04-21 MED ORDER — HEPARIN (PORCINE) 25000 UT/250ML-% IV SOLN
800.0000 [IU]/h | INTRAVENOUS | Status: DC
  Administered 2019-04-21 – 2019-04-23 (×2): 700 [IU]/h via INTRAVENOUS
  Filled 2019-04-21 (×2): qty 250

## 2019-04-21 MED ORDER — HEPARIN BOLUS VIA INFUSION
3700.0000 [IU] | Freq: Once | INTRAVENOUS | Status: AC
  Administered 2019-04-21: 18:00:00 3700 [IU] via INTRAVENOUS
  Filled 2019-04-21: qty 3700

## 2019-04-21 MED ORDER — INSULIN ASPART 100 UNIT/ML ~~LOC~~ SOLN
5.0000 [IU] | Freq: Three times a day (TID) | SUBCUTANEOUS | Status: DC
  Administered 2019-04-22: 5 [IU] via SUBCUTANEOUS
  Filled 2019-04-21 (×2): qty 1

## 2019-04-21 MED ORDER — POTASSIUM CHLORIDE 20 MEQ PO PACK
40.0000 meq | PACK | ORAL | Status: AC
  Administered 2019-04-21: 13:00:00 40 meq via ORAL
  Filled 2019-04-21: qty 2

## 2019-04-21 MED ORDER — INSULIN ASPART 100 UNIT/ML ~~LOC~~ SOLN
0.0000 [IU] | Freq: Three times a day (TID) | SUBCUTANEOUS | Status: DC
  Administered 2019-04-21: 5 [IU] via SUBCUTANEOUS
  Administered 2019-04-21: 17:00:00 8 [IU] via SUBCUTANEOUS
  Administered 2019-04-22: 11 [IU] via SUBCUTANEOUS
  Administered 2019-04-22: 2 [IU] via SUBCUTANEOUS
  Administered 2019-04-23: 5 [IU] via SUBCUTANEOUS
  Administered 2019-04-23: 8 [IU] via SUBCUTANEOUS
  Administered 2019-04-23: 3 [IU] via SUBCUTANEOUS
  Administered 2019-04-24: 11 [IU] via SUBCUTANEOUS
  Administered 2019-04-25 (×2): 3 [IU] via SUBCUTANEOUS
  Administered 2019-04-25: 5 [IU] via SUBCUTANEOUS
  Administered 2019-04-26: 15 [IU] via SUBCUTANEOUS
  Administered 2019-04-26: 3 [IU] via SUBCUTANEOUS
  Administered 2019-04-26: 8 [IU] via SUBCUTANEOUS
  Administered 2019-04-27: 5 [IU] via SUBCUTANEOUS
  Administered 2019-04-27: 11 [IU] via SUBCUTANEOUS
  Administered 2019-04-27 – 2019-04-28 (×2): 5 [IU] via SUBCUTANEOUS
  Administered 2019-04-28: 11 [IU] via SUBCUTANEOUS
  Filled 2019-04-21 (×19): qty 1

## 2019-04-21 MED ORDER — INSULIN GLARGINE 100 UNIT/ML ~~LOC~~ SOLN
30.0000 [IU] | Freq: Every day | SUBCUTANEOUS | Status: DC
  Administered 2019-04-22: 30 [IU] via SUBCUTANEOUS
  Filled 2019-04-21: qty 0.3

## 2019-04-21 MED ORDER — MORPHINE SULFATE (PF) 2 MG/ML IV SOLN
1.0000 mg | INTRAVENOUS | Status: DC | PRN
  Administered 2019-04-21: 2 mg via INTRAVENOUS
  Filled 2019-04-21: qty 1

## 2019-04-21 MED ORDER — POTASSIUM CHLORIDE 20 MEQ PO PACK
40.0000 meq | PACK | Freq: Once | ORAL | Status: AC
  Administered 2019-04-21: 40 meq via ORAL
  Filled 2019-04-21: qty 2

## 2019-04-21 MED ORDER — INSULIN ASPART 100 UNIT/ML ~~LOC~~ SOLN
0.0000 [IU] | Freq: Every day | SUBCUTANEOUS | Status: DC
  Administered 2019-04-21: 22:00:00 3 [IU] via SUBCUTANEOUS
  Administered 2019-04-24 – 2019-04-25 (×2): 2 [IU] via SUBCUTANEOUS
  Filled 2019-04-21 (×3): qty 1

## 2019-04-21 MED ORDER — ASPIRIN 81 MG PO CHEW
324.0000 mg | CHEWABLE_TABLET | Freq: Once | ORAL | Status: AC
  Administered 2019-04-21: 324 mg via ORAL
  Filled 2019-04-21: qty 4

## 2019-04-21 MED ORDER — POTASSIUM CHLORIDE 20 MEQ PO PACK
40.0000 meq | PACK | ORAL | Status: AC
  Administered 2019-04-21: 03:00:00 40 meq via ORAL
  Filled 2019-04-21 (×2): qty 2

## 2019-04-21 MED ORDER — TRAZODONE HCL 50 MG PO TABS
50.0000 mg | ORAL_TABLET | Freq: Every evening | ORAL | Status: DC | PRN
  Administered 2019-04-21 – 2019-04-22 (×2): 50 mg via ORAL
  Filled 2019-04-21 (×2): qty 1

## 2019-04-21 NOTE — Progress Notes (Signed)
Pharmacy Electrolyte Monitoring Consult:  Pharmacy consulted to assist in monitoring and replacing electrolytes in this 68 y.o. female admitted on 04/19/2019 with Altered Mental Status   Labs:  Sodium (mmol/L)  Date Value  04/21/2019 145  10/16/2018 135   Potassium (mmol/L)  Date Value  04/21/2019 3.3 (L)   Magnesium (mg/dL)  Date Value  04/20/2019 2.2   Phosphorus (mg/dL)  Date Value  04/20/2019 3.3   Calcium (mg/dL)  Date Value  04/21/2019 8.1 (L)   Albumin (g/dL)  Date Value  04/19/2019 3.9    Assessment/Plan: 07/20 Initially placed orders for KCI 10 mEq IV x 5 runs; however, per RN patient is unable to tolerate these runs; therefore patient's PO status has been changed to NPO except meds and will discontinue the runs and replace w/ KCI 40 mEq PO power packets x 3 for RN to dissolve and will recheck w/ routine labs.   Tobie Lords, PharmD, BCPS Clinical Pharmacist 04/21/2019 2:36 AM      ;

## 2019-04-21 NOTE — Progress Notes (Signed)
[facility] [place] NAME: Kristy [name]    MR#:  [id number]  DATE OF BIRTH:  10-25-1950  SUBJECTIVE:  CHIEF COMPLAINT:   Chief Complaint  Patient presents with  . Altered Mental Status   Came with altered mental status and noted to be in DKA with elevated troponin and lipase. Remains confused. REVIEW OF SYSTEMS:   Due to AMS , can not give ROS.  ROS  DRUG ALLERGIES:   Allergies  Allergen Reactions  . Fish Oil Other (See Comments)    Gout  . Glimepiride     REACTION: hypoglycemia  . Guanfacine Hcl     REACTION: unspecified  . Rosiglitazone Other (See Comments)    CHF    VITALS:  Blood pressure (!) 119/53, pulse (!) 101, temperature 98.3 F (36.8 C), temperature source Axillary, resp. rate 17, height 5' 0.5" (1.537 m), weight 69.8 kg, SpO2 96 %.  PHYSICAL EXAMINATION:   GENERAL:  68 y.o.-year-old patient lying in the bed with shallow breaths EYES: Pupils equal, round, reactive to light and accommodation. No scleral icterus.  HEENT: Head atraumatic, normocephalic. Oropharynx and nasopharynx clear.  NECK:  Supple, no jugular venous distention. No thyroid enlargement, no tenderness.  LUNGS: Normal breath sounds bilaterally, no wheezing, rales,rhonchi or crepitation. No use of accessory muscles of respiration.  CARDIOVASCULAR: S1, S2 normal. No murmurs, rubs, or gallops.  ABDOMEN: Soft, nontender, nondistended. Bowel sounds present. No organomegaly or mass.  EXTREMITIES: No pedal edema, cyanosis, or clubbing.  NEUROLOGIC: Patient moving all of her extremities on her own, not following commands. PSYCHIATRIC: The patient is alert but confused.  SKIN: No rash, lesion, or ulcer.   Physical Exam LABORATORY PANEL:   CBC Recent Labs  Lab 04/21/19 0648  WBC 18.4*  HGB 12.8  HCT 39.3  PLT 139*   ------------------------------------------------------------------------------------------------------------------  Chemistries   Recent Labs  Lab 04/19/19 1854  04/21/19 0648 04/21/19 1447  NA 130*   < > 145 143  K 5.0   < > 2.8* 3.7  CL 86*   < > 97* 99  CO2 12*   < > 37* 31  GLUCOSE 1,161*   < > 208* 297*  BUN 34*   < > 29* 26*  CREATININE 2.26*   < > 1.71* 1.51*  CALCIUM 9.7   < > 7.8* 8.0*  MG  --    < > 1.8  --   AST 45*  --   --   --   ALT 26  --   --   --   ALKPHOS 146*  --   --   --   BILITOT 1.9*  --   --   --    < > = values in this interval not displayed.   ------------------------------------------------------------------------------------------------------------------  Cardiac Enzymes No results for input(s): TROPONINI in the last 168 hours. ------------------------------------------------------------------------------------------------------------------  RADIOLOGY:  Ct Head Wo Contrast  Result Date: 04/19/2019 CLINICAL DATA:  Altered level of consciousness (LOC), unexplained AMS; Neck pain, chronic, abn neuro exam, neg xray AMS. Found unresponsive. EXAM: CT HEAD WITHOUT CONTRAST CT CERVICAL SPINE WITHOUT CONTRAST TECHNIQUE: Multidetector CT imaging of the head and cervical spine was performed following the standard protocol without intravenous contrast. Multiplanar CT image reconstructions of the cervical spine were also generated. COMPARISON:  Head CT 10/30/2018 FINDINGS: CT HEAD FINDINGS Brain: No intracranial hemorrhage, mass effect, or midline shift. No hydrocephalus. The basilar cisterns are patent. No evidence of territorial infarct or acute ischemia.  Unchanged degree of atrophy and mild chronic small vessel ischemia. No extra-axial or intracranial fluid collection. Vascular: No hyperdense vessel. Skull: No fracture or focal lesion. Sinuses/Orbits: Paranasal sinuses and mastoid air cells are clear. The visualized orbits are unremarkable. Other: None. CT CERVICAL SPINE FINDINGS Alignment: Trace anterolisthesis of C4 on C5 is likely facet mediated. Alignment otherwise maintained. Skull base and  vertebrae: No acute fracture. Vertebral body heights are maintained. The dens and skull base are intact. Incidental vertebral body hemangioma within C4. Soft tissues and spinal canal: No prevertebral fluid or swelling. No visible canal hematoma. Disc levels: Disc space narrowing and endplate spurring at V3-X1 and C6-C7. Multilevel facet hypertrophy. No significant spinal canal stenosis. Bilateral neural foraminal stenosis at C5-C6 and right neural foraminal stenosis at C6-C7. Upper chest: 11 mm right thyroid nodule. No dedicated imaging follow-up is needed due to size. Other: None. IMPRESSION: 1. No acute intracranial abnormality. Mild atrophy and chronic small vessel ischemia, similar to prior exam. 2. Multilevel degenerative change in the cervical spine without acute abnormality. Electronically Signed   By: Keith Rake M.D.   On: 04/19/2019 19:41   Ct Cervical Spine Wo Contrast  Result Date: 04/19/2019 CLINICAL DATA:  Altered level of consciousness (LOC), unexplained AMS; Neck pain, chronic, abn neuro exam, neg xray AMS. Found unresponsive. EXAM: CT HEAD WITHOUT CONTRAST CT CERVICAL SPINE WITHOUT CONTRAST TECHNIQUE: Multidetector CT imaging of the head and cervical spine was performed following the standard protocol without intravenous contrast. Multiplanar CT image reconstructions of the cervical spine were also generated. COMPARISON:  Head CT 10/30/2018 FINDINGS: CT HEAD FINDINGS Brain: No intracranial hemorrhage, mass effect, or midline shift. No hydrocephalus. The basilar cisterns are patent. No evidence of territorial infarct or acute ischemia. Unchanged degree of atrophy and mild chronic small vessel ischemia. No extra-axial or intracranial fluid collection. Vascular: No hyperdense vessel. Skull: No fracture or focal lesion. Sinuses/Orbits: Paranasal sinuses and mastoid air cells are clear. The visualized orbits are unremarkable. Other: None. CT CERVICAL SPINE FINDINGS Alignment: Trace  anterolisthesis of C4 on C5 is likely facet mediated. Alignment otherwise maintained. Skull base and vertebrae: No acute fracture. Vertebral body heights are maintained. The dens and skull base are intact. Incidental vertebral body hemangioma within C4. Soft tissues and spinal canal: No prevertebral fluid or swelling. No visible canal hematoma. Disc levels: Disc space narrowing and endplate spurring at G6-Y6 and C6-C7. Multilevel facet hypertrophy. No significant spinal canal stenosis. Bilateral neural foraminal stenosis at C5-C6 and right neural foraminal stenosis at C6-C7. Upper chest: 11 mm right thyroid nodule. No dedicated imaging follow-up is needed due to size. Other: None. IMPRESSION: 1. No acute intracranial abnormality. Mild atrophy and chronic small vessel ischemia, similar to prior exam. 2. Multilevel degenerative change in the cervical spine without acute abnormality. Electronically Signed   By: Keith Rake M.D.   On: 04/19/2019 19:41   Mr Brain Wo Contrast  Result Date: 04/20/2019 CLINICAL DATA:  Recent fall. Altered mental status. Negative CT evaluation. EXAM: MRI HEAD WITHOUT CONTRAST TECHNIQUE: Multiplanar, multiecho pulse sequences of the brain and surrounding structures were obtained without intravenous contrast. COMPARISON:  CT 04/19/2019 FINDINGS: Brain: Diffusion imaging does not show any acute or subacute infarction. The brainstem and cerebellum are normal. Cerebral hemispheres show mild chronic small-vessel ischemic change of the white matter. No cortical or large vessel territory infarction. No mass lesion, hemorrhage, hydrocephalus or extra-axial collection. Vascular: Major vessels at the base of the brain show flow. Skull and upper cervical spine: Negative Sinuses/Orbits:  Clear/normal Other: None IMPRESSION: No acute or significant finding. Mild chronic small-vessel ischemic change of the hemispheric white matter, often seen at this age. Electronically Signed   By: Nelson Chimes  M.D.   On: 04/20/2019 14:32   Dg Chest Port 1 View  Result Date: 04/19/2019 CLINICAL DATA:  Altered mental status. EXAM: PORTABLE CHEST 1 VIEW COMPARISON:  October 30, 2018 FINDINGS: The heart size and mediastinal contours are within normal limits. Both lungs are clear. The visualized skeletal structures are unremarkable. IMPRESSION: No active disease. Electronically Signed   By: Constance Holster M.D.   On: 04/19/2019 19:42    ASSESSMENT AND PLAN:   Active Problems:   Acute on chronic HFrEF (heart failure with reduced ejection fraction) ([facility])   DKA (diabetic ketoacidoses) ([facility])   Pneumonia   Sepsis ([place])   Elevated troponin  1.  Diabetic ketoacidosis.  Placed on insulin drip.   Serial BMPs.   Manage per ICU team.   Resolved. 2.  Acute encephalopathy.  Could be secondary to diabetic ketoacidosis or sepsis.  CT scan of the head negative.  Continue to monitor.  MRI brain negative. Seen by Neurology. Plan for EEG> 3.  Clinical sepsis.  Unclear cause.  Patient has fever and leukocytosis and tachycardia.  Empiric antibiotics.  Follow-up cultures.  Follow-up urine. Might do CT once pt is stable. 4.  Wide-complex tachycardia.  Patient has had a history of left bundle branch block.  Has seen [facility] cardiology in the past if needed.   Have High troponin, but as per intencivist- likely due to Sepsis and as troponin stable, cont to monitor.  Seen by cardiology today- suggested to start heparine drip and possible Cath once renal func stable. 5.  History of nonischemic cardiomyopathy. Ac on ch systolic CHF    Have renal failure, cardio suggest to start Coreg, wait for enteresto and do cath once renal func improve. 6.  Hypertension 7.  Hyperlipidemia unspecified 8.  GERD 9.  History of Crohn's disease   All the records are reviewed and case discussed with Care Management/Social Workerr. Management plans discussed with the patient, family and they are in agreement.  CODE STATUS: Full.  TOTAL  TIME TAKING CARE OF THIS PATIENT: 35 minutes.    POSSIBLE D/C IN 1-2 DAYS, DEPENDING ON CLINICAL CONDITION.   Vaughan Basta M.D on 04/21/2019   Between 7am to 6pm - Pager - [phone]  After 6pm go to [email or web address] - password [email or web address] [facility]  Office  [phone]  CC: Primary care physician; Owens Loffler, MD  Note: This dictation was prepared with Dragon dictation along with smaller phrase technology. Any transcriptional errors that result from this process are unintentional.

## 2019-04-21 NOTE — Progress Notes (Signed)
eeg completed ° °

## 2019-04-21 NOTE — Consult Note (Signed)
Neurology Consult  Referring Physician: Dr. Anselm Jungling  Chief Complaint/Reason for Consult:  I have been asked to see the patient in neurological consultation to render advice and opinion regarding AMS.  HPI: [name] is a 68 y.o.  female with a history significant for poorly controlled DM, HFrEF, HLD, HTN who presented on 04/19/2019 with AMS. History provided primarily by chart review of EMR. Patient is not a good historian.  Patient reportedly had a fall in the bathtub a few days prior to presentation. The day prior to presentation, family couldn't reach her so her daughter came over to check on the patient. Patient was found in bed in a fetal position. The patient reportedly did not take any of her medications. She was found to be in DKA upon arrival to ED. Patient was placed on an insulin drip.   Neurology consulted for AMS and repetitive speech.  MRI brain w/o demonstrated no acute infarcts.    Past Medical History:  Diagnosis Date  . Allergic rhinitis   . Allergy   . Cataract    mild   . Crohn's disease ([place]) 10/13/2013  . Diverticulosis   . GERD (gastroesophageal reflux disease)   . Gout   . HFrEF (heart failure with reduced ejection fraction) ([place])    a. 12/2008 Cath: EF 45% w/ inf HK; b. 07/2009 Echo: EF 50-55%; c. 08/2018 Echo: EF 20-25%.  Marland Kitchen Hyperlipidemia   . Hypertension   . IBS (irritable bowel syndrome)   . Left bundle branch block   . Neuromuscular disorder (HCC)    neuropathy  . NICM (nonischemic cardiomyopathy) ([place])    a. 12/2008 Cath: no significant dzs, EF 45% w/ inf HK->Med Rx; b. 07/2009 Echo: EF 50-55%; c. 08/2018 Echo: EF 20-25%, ant/antsept HK, mild MR, mildly dil LA, nl RV fx; d. 08/2018 Cath: D1 80, otw nonobs dzs->Med rx.  . Non-obstructive CAD (coronary artery disease)    a. 12/2008 Cath: no significant dzs, EF 45% w/ inf HK->Med Rx; b. 08/2018 Cath: LM nl, LAD min irregs, D1 80, LCX 43md, RCA min irregs->Med Rx.  . Osteoarthritis   . Poorly  controlled Diabetes mellitus    a. 07/2018 A1c 13.1.  .Marland KitchenSymptomatic cholelithiasis     Past Surgical History:  Procedure Laterality Date  . COLONOSCOPY    . DILATION AND CURETTAGE OF UTERUS    . RIGHT/LEFT HEART CATH AND CORONARY ANGIOGRAPHY N/A 08/26/2018   Procedure: RIGHT/LEFT HEART CATH AND CORONARY ANGIOGRAPHY;  Surgeon: AWellington Hampshire MD;  [facility];  Service: Cardiovascular;  Laterality: N/A;  . SHOULDER SURGERY     15 + yrs ago   . SKIN SURGERY     nose   . VAGINAL HYSTERECTOMY     Allergies  Allergen Reactions  . Fish Oil Other (See Comments)    Gout  . Glimepiride     REACTION: hypoglycemia  . Guanfacine Hcl     REACTION: unspecified  . Rosiglitazone Other (See Comments)    CHF   Prior to Admission medications   Medication Sig Start Date End Date Taking? Authorizing Provider  acetaminophen (TYLENOL) 325 MG tablet Take 325 mg by mouth daily as needed for moderate pain or headache.   Yes [provider]  carvedilol (COREG) 6.25 MG tablet Take 1 tablet (6.25 mg total) by mouth 2 (two) times daily. 08/22/18  Yes Dunn, RAreta Haber PA-C  chlorpheniramine (CHLOR-TRIMETON) 4 MG tablet Take 4 mg by mouth daily as needed for  allergies.   Yes [provider]  FLUoxetine (PROZAC) 20 MG capsule Take 1 capsule (20 mg total) by mouth daily. 02/12/19  Yes Copland, [name], MD  insulin regular human CONCENTRATED (HUMULIN R U-500 KWIKPEN) 500 UNIT/ML kwikpen Inject 60-100 Units into the skin See admin instructions. Inject 100u under the skin at breakfast and lunch and inject 80u under the skin at dinner - inject 60u if eating a small / protein rich meal   Yes [provider]  Insulin Syringe-Needle U-100 (B-D INS SYRINGE 0.5CC/31GX5/16) 31G X 5/16" 0.5 ML MISC USE AS DIRECTED THREE TIMES A DAY 08/06/17  Yes Copland, [name], MD  ivabradine (CORLANOR) 5 MG TABS tablet Take 1 tablet (5 mg total) by mouth 2 (two) times daily with a meal. 01/23/19   Yes Theora Gianotti, NP  metroNIDAZOLE (METROGEL) 0.75 % gel Apply 1 application topically daily as needed (acne).   Yes [provider]  ONE TOUCH ULTRA TEST test strip USE AS PER PACKAGE INSTRUCTIONS CHECK SUGARS UP TO-FOUR TIMES A DAY OR AS PRESCRIBED 05/21/17  Yes Copland, [name], MD  Probiotic Product (ALIGN) 4 MG CAPS Take 4 mg by mouth daily.    Yes [provider]  sacubitril-valsartan (ENTRESTO) 49-51 MG Take 1 tablet (49/51 mg) by mouth 2 times daily starting on Sunday 09/15/18 01/16/19  Yes Gollan, Kathlene November, MD  NONFORMULARY OR COMPOUNDED ITEM See pharmacy note Patient taking differently: Apply 1 application topically at bedtime as needed (neuropathy). Peripheral Neuropathy Cream-  Bupivacaine 1%, Doxepin 3%, Gabapentin 6%, Pentoxifylline 3%, Topiramate 1%  Apply 1-2 grams to affected area 3-4 times daily  Qty. 120 gm 07/20/17   Edrick Kins, DPM   Family History  Problem Relation Age of Onset  . Hypertension Father   . Colon cancer Neg Hx   . Colon polyps Neg Hx   . Rectal cancer Neg Hx   . Stomach cancer Neg Hx    Relationships  Social connections  . Talks on phone: Not on file  . Gets together: Not on file  . Attends religious service: Not on file  . Active member of club or organization: Not on file  . Attends meetings of clubs or organizations: Not on file  . Relationship status: Not on file      Continuous Infusions: Scheduled Meds:  . Chlorhexidine Gluconate Cloth  6 each Topical Q0600  . heparin injection (subcutaneous)  5,000 Units Subcutaneous Q8H  . insulin aspart  0-15 Units Subcutaneous TID WC  . insulin aspart  0-5 Units Subcutaneous QHS  . insulin glargine  21 Units Subcutaneous Daily  . mouth rinse  15 mL Mouth Rinse BID  . potassium chloride  40 mEq Oral Q3H  . potassium chloride  40 mEq Oral Once   PRN Meds: acetaminophen **OR** acetaminophen, metoprolol tartrate, [DISCONTINUED] ondansetron **OR** ondansetron (ZOFRAN)  IV   Review of Systems Review of systems not obtained due to patient factors. - altered - not following commands consistently    Objective: Temp:  [97.6 F (36.4 C)-98.7 F (37.1 C)] 97.8 F (36.6 C) (07/20 0800) Pulse Rate:  [84-126] 101 (07/20 0600) Resp:  [10-29] 14 (07/20 0900) BP: (75-124)/(48-92) 124/69 (07/20 0900) SpO2:  [93 %-99 %] 96 % (07/20 0800)   Intake/Output Summary (Last 24 hours) at 04/21/2019 1013 Last data filed at 04/21/2019 0800 Gross per 24 hour  Intake 2943.75 ml  Output 700 ml  Net 2243.75 ml    Wt Readings from Last 3 Encounters:  04/19/19 69.8 kg  02/12/19 69.9 kg  11/08/18 68.6 kg      Physical Exam:  General: Appears disheveled and confused   ENT: Oropharynx clear. No lesions or exudate.  Cardiovascular: Tachycardic  Respiratory: Coarse lung sounds   Gastrointestinal: Abdomen soft, non-tender, nondistended  Skin: No rashes, lesions, or ulcerations, no subcutaneous nodules or induration     Mental Status: Alert. Oriented to self. Not oriented to place, year or her own birthdate Not following complex commands. Able to follow one step commands Concentration severely decreased  Speech is very limited. Appears to comprehend but decreased   Cranial Nerves   II Visual Fields: Intact to confrontation   III, IV, VI: Pupils equal and reactive to light and near. Full eye movement without nystagmus  V Facial Sensation: Normal. No weakness of masticatory muscles  VII: No facial weakness or asymmetry  VIII Auditory Acuity: Grossly normal  IX/X: The uvula is midline; the palate elevates symmetrically  XI: Normal sternocleidomastoid and trapezius strength  XII: The tongue is midline. No atrophy or fasciculations.     Motor System: Muscle Strength: 5/5 in the bilateral upper extremities. In the bilateral lower extremities, appear to be at least anti-gravity.   Tone: no increased tone   Reflexes: DTRs: 1+ and symmetrical in all four extremities. Plantar  responses are muted bilaterally. No clonus   Coordination: Unable to do finger-to-nose or heel-to-shin.No tremor   Sensation: Withdraws to painful stimuli  Gait: Deferred due to safety   Other:     Labs: Recent Labs  Lab 04/19/19 1854 04/20/19 0414 04/21/19 0648  WBC 25.9* 28.4* 18.4*  HGB 16.1* 14.3 12.8  HCT 53.0* 42.1 39.3  PLT 266 205 139*  MCV 97.8 89.0 92.0   Recent Labs  Lab 04/20/19 0027  04/20/19 0850 04/20/19 1447 04/21/19 0026 04/21/19 0648  NA 135   < > 145  --  145 145  K 3.9   < > 3.1* 2.9* 3.3* 2.8*  CL 102   < > 104  --  99 97*  CO2 12*   < > 28  --  34* 37*  BUN 39*   < > 30*  --  31* 29*  CREATININE 1.98*   < > 1.32*  --  1.42* 1.71*  CALCIUM 9.2   < > 8.8*  --  8.1* 7.8*  MG 2.2  --   --   --   --  1.8  PHOS <1.0*  --  2.5 3.3  --  2.1*   < > = values in this interval not displayed.      Significant Diagnostic Studies: All images independently visualized.    Impression: Ms. NIKCOLE EISCHEID is a 68 y.o.  female with a history significant for poorly controlled DM, HFrEF, HLD, HTN who presented on 04/19/2019 with AMS. Exam is non-focal. Speech is limited. There was concerned for perserveration; however, this was not witnessed on my exam. Patient is certainly confused. MRI brain w/o demonstrated no acute findings. Likely AMS is related to DKA. But other consideration could be seizures causing perserveration; though would be unusual to develop epilepsy at this age and no prior cerebral lesions. Event could be secondary to elevated BG  Active Problems:   DKA (diabetic ketoacidoses) ([place])   Pneumonia   Sepsis ([place])   Recommendations: - Recommend routine EEG, which has been ordered  - Continue with medical management of DKA as is - Neurology will follow up on EEG results  It has been a pleasure to participate in the care of this patient.  Best Regards,

## 2019-04-21 NOTE — Progress Notes (Signed)
Name: Kristy [name] MRN: [id number] DOB: Nov 19, 1950     CHIEF COMPLAINT:  Altered mental status   HISTORY OF PRESENT ILLNESS:  68 y.o. white female with a history of poorly controlled DM, CHF, CAD, HTN, LBBB, HLD was brought to ED with AMS and elevated BG. Per chart review of EMR patient was found with AMS on the floor with vomit and soda cans around her, unclear how long she was there. Recent history of falls. Patient had not taken medications for the last few days. EKG, CXR, CT head w/o contrast, CT cervical spine w/o contrast, and BG collected in ED. Elevated creatine noted in ED. BG was 1,161, insulin was given and elevated WBC and tachycardia present, concern for sepsis patient started on broad spectrum abx in ED.   EVENTS Overnight: Patient continues to have AMS but improvement noted by nursing staff. Yesterday she was very active as she was constantly moving around the bed and pulling at lines per RN. Today she is calmer. Upon entering the room today patient was seated up in bed eating lunch. She responded to verbal stimuli and oriented to person as she was able to recall her full name and DOB. She was able to recall who the current president is but unable to answer where she is, the month, or year. Updated EF shows decrease in function as in 12/10/18 EF noted to be 50% and Echo on 04/21/19 EF noted to be at 35%,  She still presents with AMS making ROS challenging to obtain. She was able to follow simple commands. She denies any chest pain, tightness, or SOB. She admitted to abdominal pain but when asked where she pointed to her chest and noted a recent heart attack, which is not seen in the EMR. She also noted eye pain but when asked where she pointed to her nose.    SIGNIFICANT EVENTS: 04/19/19: Presented to ED with AMS and DKA 04/20/19: MRI of brain negative 04/21/19: Stop IV abx, EEG pending, Echo complete EF 35%     PAST MEDICAL HISTORY :   has a past medical history of Allergic  rhinitis, Allergy, Cataract, Crohn's disease ([place]) (10/13/2013), Diverticulosis, GERD (gastroesophageal reflux disease), Gout, HFrEF (heart failure with reduced ejection fraction) ([place]), Hyperlipidemia, Hypertension, IBS (irritable bowel syndrome), Left bundle branch block, Neuromuscular disorder ([place]), NICM (nonischemic cardiomyopathy) ([place]), Non-obstructive CAD (coronary artery disease), Osteoarthritis, Poorly controlled Diabetes mellitus, and Symptomatic cholelithiasis.  has a past surgical history that includes Dilation and curettage of uterus; Vaginal hysterectomy; Skin surgery; Colonoscopy; Shoulder surgery; and RIGHT/LEFT HEART CATH AND CORONARY ANGIOGRAPHY (N/A, 08/26/2018).   Allergies  Allergen Reactions  . Fish Oil Other (See Comments)    Gout  . Glimepiride     REACTION: hypoglycemia  . Guanfacine Hcl     REACTION: unspecified  . Rosiglitazone Other (See Comments)    CHF    REVIEW OF SYSTEMS:  Limited due to AMS  HEENT: Denies blurred vision, double vision, ear pain, + eye pain Cardiac:  No chest pain or heaviness, chest tightness Resp:  -shortness of breath Gi: Denies swallowing difficulty, +stomach pain  VITAL SIGNS: Temp:  [97.6 F (36.4 C)-98.7 F (37.1 C)] 97.8 F (36.6 C) (07/20 0800) Pulse Rate:  [84-117] 101 (07/20 0600) Resp:  [10-29] 25 (07/20 1000) BP: (75-124)/(48-92) 105/56 (07/20 1000) SpO2:  [93 %-99 %] 96 % (07/20 0800)   I/O last 3 completed shifts: In: 7473.6 [I.V.:3844.4; IV Piggyback:3629.2] Out: 8280 [Urine:1750] Total I/O In: 494.7 [I.V.:456.9; IV Piggyback:37.8]  Out: 150 [Urine:150]   SpO2: 96 % O2 Flow Rate (L/min): 2 L/min   Physical Examination:  GENERAL: disheveled appearance, in no acute distress  HEAD: Normocephalic, atraumatic.  EYES: Pupils equal, round, reactive to light.  No scleral icterus. EOM assessed and noted to have trouble following to the patients right side.  MOUTH: Moist mucosal membrane. NECK: Supple. No JVD.   PULMONARY: Clear to ascultation bilaterally CARDIOVASCULAR: S1 and S2. Tachycardic with regular rhythm. No murmurs, rubs, or gallops.  GASTROINTESTINAL: Soft, nontender, -distended. No masses. Positive bowel sounds. No hepatosplenomegaly.  MUSCULOSKELETAL: No swelling, clubbing, or edema. 4+ arm and leg strength, grip strength present.  NEUROLOGIC: able to follow simple commands, awake and oriented to person SKIN:intact,warm,dry   CULTURE RESULTS   Recent Results (from the past 240 hour(s))  Blood culture (routine x 2)     Status: None (Preliminary result)   Collection Time: 04/19/19  6:53 PM   Specimen: BLOOD  Result Value Ref Range Status   Specimen Description BLOOD LAC  Final   Special Requests   Final    BOTTLES DRAWN AEROBIC AND ANAEROBIC Blood Culture adequate volume   Culture   Final    NO GROWTH 2 DAYS Performed at [facility] [address], [place], [place] [address]    Report Status PENDING  Incomplete  Blood culture (routine x 2)     Status: None (Preliminary result)   Collection Time: 04/19/19  7:50 PM   Specimen: BLOOD  Result Value Ref Range Status   Specimen Description BLOOD LEFT ANTECUBITAL  Final   Special Requests   Final    BOTTLES DRAWN AEROBIC AND ANAEROBIC Blood Culture adequate volume   Culture   Final    NO GROWTH 2 DAYS Performed at [facility], [address] [place], [place] [address]    Report Status PENDING  Incomplete  SARS Coronavirus 2 (CEPHEID - Performed in [facility] hospital lab), Hosp Order     Status: None   Collection Time: 04/19/19  8:37 PM   Specimen: Nasopharyngeal Swab  Result Value Ref Range Status   SARS Coronavirus 2 NEGATIVE NEGATIVE Final    Comment: (NOTE) If result is NEGATIVE SARS-CoV-2 target nucleic acids are NOT DETECTED. The SARS-CoV-2 RNA is generally detectable in upper and lower  respiratory specimens during the acute phase of infection. The lowest  concentration of SARS-CoV-2 viral  copies this assay can detect is 250  copies / mL. A negative result does not preclude SARS-CoV-2 infection  and should not be used as the sole basis for treatment or other  patient management decisions.  A negative result may occur with  improper specimen collection / handling, submission of specimen other  than nasopharyngeal swab, presence of viral mutation(s) within the  areas targeted by this assay, and inadequate number of viral copies  (<250 copies / mL). A negative result must be combined with clinical  observations, patient history, and epidemiological information. If result is POSITIVE SARS-CoV-2 target nucleic acids are DETECTED. The SARS-CoV-2 RNA is generally detectable in upper and lower  respiratory specimens dur ing the acute phase of infection.  Positive  results are indicative of active infection with SARS-CoV-2.  Clinical  correlation with patient history and other diagnostic information is  necessary to determine patient infection status.  Positive results do  not rule out bacterial infection or co-infection with other viruses. If result is PRESUMPTIVE POSTIVE SARS-CoV-2 nucleic acids MAY BE PRESENT.   A presumptive positive result  was obtained on the submitted specimen  and confirmed on repeat testing.  While 2019 novel coronavirus  (SARS-CoV-2) nucleic acids may be present in the submitted sample  additional confirmatory testing may be necessary for epidemiological  and / or clinical management purposes  to differentiate between  SARS-CoV-2 and other Sarbecovirus currently known to infect humans.  If clinically indicated additional testing with an alternate test  methodology [phone]) is advised. The SARS-CoV-2 RNA is generally  detectable in upper and lower respiratory sp ecimens during the acute  phase of infection. The expected result is Negative. Fact Sheet for Patients:  StrictlyIdeas.no Fact Sheet for Healthcare Providers:  BankingDealers.co.za This test is not yet approved or cleared by the [place] FDA and has been authorized for detection and/or diagnosis of SARS-CoV-2 by FDA under an Emergency Use Authorization (EUA).  This EUA will remain in effect (meaning this test can be used) for the duration of the COVID-19 declaration under Section 564(b)(1) of the Act, 21 U.S.C. section 360bbb-3(b)(1), unless the authorization is terminated or revoked sooner. Performed at [facility], [address] [place], [place] [address]   MRSA PCR Screening     Status: None   Collection Time: 04/20/19  1:16 AM   Specimen: Nasopharyngeal  Result Value Ref Range Status   MRSA by PCR NEGATIVE NEGATIVE Final    Comment:        The GeneXpert MRSA Assay (FDA approved for NASAL specimens only), is one component of a comprehensive MRSA colonization surveillance program. It is not intended to diagnose MRSA infection nor to guide or monitor treatment for MRSA infections. Performed at [facility], [address], [place], [place] [address]   Urine Culture     Status: None   Collection Time: 04/20/19  1:56 AM   Specimen: Urine, Random  Result Value Ref Range Status   Specimen Description   Final    URINE, RANDOM Performed at [facility], [address], [place], [place] [address]    Special Requests   Final    NONE Performed at [facility], [address], [place], [place] [address]    Culture   Final    NO GROWTH Performed at [facility], [place] [address] [place], [place] [address]    Report Status 04/21/2019 FINAL  Final          IMAGING    Mr Brain Wo Contrast  Result Date: 04/20/2019 CLINICAL DATA:  Recent fall. Altered mental status. Negative CT evaluation. EXAM: MRI HEAD WITHOUT CONTRAST TECHNIQUE: Multiplanar, multiecho pulse sequences of the brain and surrounding structures were obtained without intravenous contrast.  COMPARISON:  CT 04/19/2019 FINDINGS: Brain: Diffusion imaging does not show any acute or subacute infarction. The brainstem and cerebellum are normal. Cerebral hemispheres show mild chronic small-vessel ischemic change of the white matter. No cortical or large vessel territory infarction. No mass lesion, hemorrhage, hydrocephalus or extra-axial collection. Vascular: Major vessels at the base of the brain show flow. Skull and upper cervical spine: Negative Sinuses/Orbits: Clear/normal Other: None IMPRESSION: No acute or significant finding. Mild chronic small-vessel ischemic change of the hemispheric white matter, often seen at this age. Electronically Signed   By: Nelson Chimes M.D.   On: 04/20/2019 14:32      Indwelling Urinary Catheter continued, requirement due to   Reason to continue Indwelling Urinary Catheter strict Intake/Output monitoring for hemodynamic instability     ASSESSMENT AND PLAN SYNOPSIS  68 y.o white female admitted  for severe DKA with secondary lactic acidosis and metabolic encephalopathy likely due to poorly controled type 2 DM in conjunction to a history of HF with worsening cardiac function, EF 35%.     ACUTE SYSTOLIC CARDIAC FAILURE- EF 35% -cardiology consult placed -follow up cardiology recs  ACUTE KIDNEY INJURY/Renal Failure -follow chem 7 -follow UO -continue Foley Catheter-assess need -Avoid nephrotoxic agents  NEUROLOGY - EEG pending  -Neurology consult   CARDIAC ICU monitoring  ID -follow up cultures  GI GI PROPHYLAXIS as indicated  NUTRITIONAL STATUS DIET--> as tolerated Constipation protocol as indicated  ENDO - insulin therapy for DKA  ELECTROLYTES -follow labs as needed -replace as needed -pharmacy consultation and following   DVT/GI PRX ordered TRANSFUSIONS AS NEEDED MONITOR FSBS ASSESS the need for LABS    See my independent note from earlier today  Merton Border, MD PCCM service Mobile [phone] Pager  [phone] 04/21/2019 2:50 PM

## 2019-04-21 NOTE — Progress Notes (Signed)
PULMONARY/CCM PROGRESS NOTE  PT PROFILE: 68 y.o. F with a history of type 2 diabetes, CAD, CHF, hypertension and left bundle branch block presenting with severe DKA, lactic acidosis, severe metabolic encephalopathy    Results for orders placed or performed during the hospital encounter of 04/19/19  Blood culture (routine x 2)     Status: None (Preliminary result)   Collection Time: 04/19/19  6:53 PM   Specimen: BLOOD  Result Value Ref Range Status   Specimen Description BLOOD LAC  Final   Special Requests   Final    BOTTLES DRAWN AEROBIC AND ANAEROBIC Blood Culture adequate volume   Culture   Final    NO GROWTH 2 DAYS Performed at [facility], [address], [place], [place] [address]    Report Status PENDING  Incomplete  Blood culture (routine x 2)     Status: None (Preliminary result)   Collection Time: 04/19/19  7:50 PM   Specimen: BLOOD  Result Value Ref Range Status   Specimen Description BLOOD LEFT ANTECUBITAL  Final   Special Requests   Final    BOTTLES DRAWN AEROBIC AND ANAEROBIC Blood Culture adequate volume   Culture   Final    NO GROWTH 2 DAYS Performed at [facility], [address], [place], [place] [address]    Report Status PENDING  Incomplete  SARS Coronavirus 2 (CEPHEID - Performed in [place] hospital lab), Hosp Order     Status: None   Collection Time: 04/19/19  8:37 PM   Specimen: Nasopharyngeal Swab  Result Value Ref Range Status   SARS Coronavirus 2 NEGATIVE NEGATIVE Final    Comment: (NOTE) If result is NEGATIVE SARS-CoV-2 target nucleic acids are NOT DETECTED. The SARS-CoV-2 RNA is generally detectable in upper and lower  respiratory specimens during the acute phase of infection. The lowest  concentration of SARS-CoV-2 viral copies this assay can detect is 250  copies / mL. A negative result does not preclude SARS-CoV-2 infection  and should not be used as the sole basis for treatment or other  patient management decisions.   A negative result may occur with  improper specimen collection / handling, submission of specimen other  than nasopharyngeal swab, presence of viral mutation(s) within the  areas targeted by this assay, and inadequate number of viral copies  (<250 copies / mL). A negative result must be combined with clinical  observations, patient history, and epidemiological information. If result is POSITIVE SARS-CoV-2 target nucleic acids are DETECTED. The SARS-CoV-2 RNA is generally detectable in upper and lower  respiratory specimens dur ing the acute phase of infection.  Positive  results are indicative of active infection with SARS-CoV-2.  Clinical  correlation with patient history and other diagnostic information is  necessary to determine patient infection status.  Positive results do  not rule out bacterial infection or co-infection with other viruses. If result is PRESUMPTIVE POSTIVE SARS-CoV-2 nucleic acids MAY BE PRESENT.   A presumptive positive result was obtained on the submitted specimen  and confirmed on repeat testing.  While 2019 novel coronavirus  (SARS-CoV-2) nucleic acids may be present in the submitted sample  additional confirmatory testing may be necessary for epidemiological  and / or clinical management purposes  to differentiate between  SARS-CoV-2 and other Sarbecovirus currently known to infect humans.  If clinically indicated additional testing with an alternate test  methodology [phone]) is advised. The SARS-CoV-2 RNA is generally  detectable in upper and lower respiratory sp ecimens during the  acute  phase of infection. The expected result is Negative. Fact Sheet for Patients:  StrictlyIdeas.no Fact Sheet for Healthcare Providers: BankingDealers.co.za This test is not yet approved or cleared by the [place] FDA and has been authorized for detection and/or diagnosis of SARS-CoV-2 by FDA under an Emergency Use  Authorization (EUA).  This EUA will remain in effect (meaning this test can be used) for the duration of the COVID-19 declaration under Section 564(b)(1) of the Act, 21 U.S.C. section 360bbb-3(b)(1), unless the authorization is terminated or revoked sooner. Performed at [facility], [address] [place], [place] [address]   MRSA PCR Screening     Status: None   Collection Time: 04/20/19  1:16 AM   Specimen: Nasopharyngeal  Result Value Ref Range Status   MRSA by PCR NEGATIVE NEGATIVE Final    Comment:        The GeneXpert MRSA Assay (FDA approved for NASAL specimens only), is one component of a comprehensive MRSA colonization surveillance program. It is not intended to diagnose MRSA infection nor to guide or monitor treatment for MRSA infections. Performed at [facility], [address], [place], [place] [address]   Urine Culture     Status: None   Collection Time: 04/20/19  1:56 AM   Specimen: Urine, Random  Result Value Ref Range Status   Specimen Description   Final    URINE, RANDOM Performed at [facility], [address], [place], [place] [address]    Special Requests   Final    NONE Performed at [facility], [address] [place], [place] [address]    Culture   Final    NO GROWTH Performed at [facility], [place] [address] [place], [place] [address]    Report Status 04/21/2019 FINAL  Final     ANTIMICROBIALS:  Anti-infectives (From admission, onward)   Start     Dose/Rate Route Frequency Ordered Stop   04/20/19 2000  ceFEPIme (MAXIPIME) 2 g in sodium chloride 0.9 % 100 mL IVPB  Status:  Discontinued     2 g 200 mL/hr over 30 Minutes Intravenous Every 24 hours 04/19/19 2356 04/20/19 0821   04/20/19 0830  piperacillin-tazobactam (ZOSYN) IVPB 3.375 g  Status:  Discontinued     3.375 g 12.5 mL/hr over 240 Minutes Intravenous Every 8 hours 04/20/19 0821 04/21/19 0939   04/20/19 0730  vancomycin (VANCOCIN)  IVPB 750 mg/150 ml premix  Status:  Discontinued     750 mg 150 mL/hr over 60 Minutes Intravenous  Once 04/20/19 0725 04/20/19 0935   04/20/19 0400  metroNIDAZOLE (FLAGYL) IVPB 500 mg  Status:  Discontinued     500 mg 100 mL/hr over 60 Minutes Intravenous Every 8 hours 04/19/19 2358 04/20/19 0821   04/19/19 2015  vancomycin (VANCOCIN) 500 mg in sodium chloride 0.9 % 100 mL IVPB  Status:  Discontinued     500 mg 100 mL/hr over 60 Minutes Intravenous  Once 04/19/19 2003 04/19/19 2006   04/19/19 2015  vancomycin (VANCOCIN) 500 mg in sodium chloride 0.9 % 100 mL IVPB     500 mg 100 mL/hr over 60 Minutes Intravenous  Once 04/19/19 2006 04/19/19 2305   04/19/19 1900  ceFEPIme (MAXIPIME) 2 g in sodium chloride 0.9 % 100 mL IVPB     2 g 200 mL/hr over 30 Minutes Intravenous  Once 04/19/19 1849 04/19/19 2049   04/19/19 1900  metroNIDAZOLE (FLAGYL) IVPB 500 mg     500 mg  100 mL/hr over 60 Minutes Intravenous  Once 04/19/19 1849 04/19/19 2055   04/19/19 1900  vancomycin (VANCOCIN) IVPB 1000 mg/200 mL premix     1,000 mg 200 mL/hr over 60 Minutes Intravenous  Once 04/19/19 1849 04/19/19 2147       SUBJ: No distress. Awake and alert. Follows commands briskly but exhibits speech perseveration. Unable to voice any new complaints  OBJ: Vitals:   04/21/19 0800 04/21/19 0900 04/21/19 1000 04/21/19 1200  BP: (!) 96/57 124/69 (!) 105/56   Pulse:      Resp: 14 14 (!) 25 19  Temp: 97.8 F (36.6 C)   98.3 F (36.8 C)  TempSrc: Oral   Axillary  SpO2: 96%     Weight:      Height:      RA  Gen: NAD HEENT: NCAT, sclerae white Neck: No LAN, no JVD noted Lungs: clear  Cardiovascular: reg, no M Abdomen: Soft, NT, +BS Ext: warm, no edema Neuro: perseverating speech, no focal deficits Skin: No lesions noted   BMP Latest Ref Rng & Units 04/21/2019 04/21/2019 04/20/2019  Glucose 70 - 99 mg/dL 208(H) 187(H) -  BUN 8 - 23 mg/dL 29(H) 31(H) -  Creatinine 0.44 - 1.00 mg/dL 1.71(H) 1.42(H) -   BUN/Creat Ratio 12 - 28 - - -  Sodium 135 - 145 mmol/L 145 145 -  Potassium 3.5 - 5.1 mmol/L 2.8(L) 3.3(L) 2.9(L)  Chloride 98 - 111 mmol/L 97(L) 99 -  CO2 22 - 32 mmol/L 37(H) 34(H) -  Calcium 8.9 - 10.3 mg/dL 7.8(L) 8.1(L) -    Hepatic Function Latest Ref Rng & Units 04/19/2019 10/30/2018 07/11/2018  Total Protein 6.5 - 8.1 g/dL 7.7 8.4(H) 6.7  Albumin 3.5 - 5.0 g/dL 3.9 4.6 3.8  AST 15 - 41 U/L 45(H) 44(H) 39(H)  ALT 0 - 44 U/L 26 34 36(H)  Alk Phosphatase 38 - 126 U/L 146(H) 121 98  Total Bilirubin 0.3 - 1.2 mg/dL 1.9(H) 1.0 0.5  Bilirubin, Direct 0.0 - 0.3 mg/dL - - 0.1    CBC Latest Ref Rng & Units 04/21/2019 04/20/2019 04/19/2019  WBC 4.0 - 10.5 K/uL 18.4(H) 28.4(H) 25.9(H)  Hemoglobin 12.0 - 15.0 g/dL 12.8 14.3 16.1(H)  Hematocrit 36.0 - 46.0 % 39.3 42.1 53.0(H)  Platelets 150 - 400 K/uL 139(L) 205 266   Cardiac Panel (last 3 results) Recent Labs    04/19/19 1854  CKTOTAL 190   Troponin (Point of Care Test) No results for input(s): TROPIPOC in the last 72 hours.   Trop I (hs) 5667  ABG    Component Value Date/Time   PHART 7.37 04/20/2019 0037   PCO2ART 22 (L) 04/20/2019 0037   PO2ART 72 (L) 04/20/2019 0037   HCO3 12.7 (L) 04/20/2019 0037   ACIDBASEDEF 10.5 (H) 04/20/2019 0037   O2SAT 93.7 04/20/2019 0037    CXR: No new film   IMPRESSION: DKA, resolved Met alkalosis, iatrogenic Hypokalemia - exacerbated by HCO3 gtt AKI Chronic LBBB Elevated trop I Perseverating speech pattern with normal MRI brain   PLAN/REC: Change SSI to ACHS Advance diet Monitor BMET intermittently Monitor I/Os Correct electrolytes as indicated DC HCO3 gtt Replete K+ Repeat EKG Cards consult Neuro consult - EEG ordered Keep in SDU through today    CCM time:  The above time includes time spent in consultation with patient and/or family members and reviewing care plan on multidisciplinary rounds  Merton Border, MD PCCM service Mobile [phone] Pager  [phone] 04/21/2019 12:44 PM

## 2019-04-21 NOTE — Progress Notes (Addendum)
Burman Nieves, NP and Shanon Brow in pharmacy notified of patients c/o arm pain related to IV potassium infusing. Per Burman Nieves, NP ok to change diet order to NPO except sips with meds. David to change potassium order.

## 2019-04-21 NOTE — Progress Notes (Signed)
Inpatient Diabetes Program Recommendations  AACE/ADA: New Consensus Statement on Inpatient Glycemic Control (2015)  Target Ranges:  Prepandial:   less than 140 mg/dL      Peak postprandial:   less than 180 mg/dL (1-2 hours)      Critically ill patients:  140 - 180 mg/dL   Lab Results  Component Value Date   GLUCAP 222 (H) 04/21/2019   HGBA1C >15.5 (H) 04/20/2019    Review of Glycemic Control Results for GLENNDA, WEATHERHOLTZ (MRN [id number]) as of 04/21/2019 13:25  Ref. Range 04/20/2019 23:24 04/21/2019 04:11 04/21/2019 07:20 04/21/2019 12:11  Glucose-Capillary Latest Ref Range: 70 - 99 mg/dL 150 (H) 219 (H) 196 (H) 222 (H)   Diabetes history: DM 2 Outpatient Diabetes medications:  U500 100 units q AM, 100 units with lunch and 80 units with dinner (last visit with Endocrinology was on 12/16/18) Current orders for Inpatient glycemic control:  Lantus 21 units daily, Novolog moderate tid with meals and HS  Inpatient Diabetes Program Recommendations:    May consider increasing Lantus to 30 units daily.  Also consider adding Novolog meal coverage 5 units tid with meals.   Of note, patient was supposed to be taking U500 concentrated insulin prior to admit.  It does not appear that she was taking it?  A1C remains > 15.%%? Patient currently confused- Will follow-up when appropriate regarding diabetes and insulin administration.   Thanks  Adah Perl, RN, BC-ADM Inpatient Diabetes Coordinator Pager [phone] (8a-5p)

## 2019-04-21 NOTE — Consult Note (Signed)
Cardiology Consultation:   Patient ID: Kristy [name] MRN: [id number]; DOB: April 05, 1951  Admit date: 04/19/2019 Date of Consult: 04/21/2019  Primary Care Provider: Owens Loffler, MD Primary Cardiologist: Ida Rogue, MD Primary Electrophysiologist:  None    Patient Profile:   Kristy [name] is a 68 y.o. female with a hx of nonischemic cardiomyopathy with normalization of LVEF, nonobstructive coronary artery disease, hypertension, hyperlipidemia, uncontrolled diabetes mellitus, and Crohn's disease, who is being seen today for the evaluation of elevated troponin at the request of Dr. Alva Garnet.  History of Present Illness:   Kristy [name] presented to the [facility] emergency department 2 days ago with altered mental status.  Her admission was preceded by a fall in the bathtub a few days ago.  On the day prior to admission, the patient's family was unable to reach Kristy [name] by phone.  Her daughter therefore went to the patient's home and found her mother on the side of the bed in the fetal position.  She was brought to the ED and found to be febrile and in DKA.  Her DKA has resolved with aggressive medical therapy in the ICU.  However, she has continued to have altered mental status with perseverating speech noted this morning.  Kristy [name] continues to have difficulty with word finding.  She reports having had chest pain, though she cannot say when that was.  When asked if it has been within the last few days, she indicates not.  She also denies shortness of breath and edema.  She is unable to provide further history.  Heart Pathway Score:     Past Medical History:  Diagnosis Date   Allergic rhinitis    Allergy    Cataract    mild    Crohn's disease ([place]) 10/13/2013   Diverticulosis    GERD (gastroesophageal reflux disease)    Gout    HFrEF (heart failure with reduced ejection fraction) ([place])    a. 12/2008 Cath: EF 45% w/ inf HK; b. 07/2009 Echo: EF 50-55%; c. 08/2018  Echo: EF 20-25%.   Hyperlipidemia    Hypertension    IBS (irritable bowel syndrome)    Left bundle branch block    Neuromuscular disorder (HCC)    neuropathy   NICM (nonischemic cardiomyopathy) ([place])    a. 12/2008 Cath: no significant dzs, EF 45% w/ inf HK->Med Rx; b. 07/2009 Echo: EF 50-55%; c. 08/2018 Echo: EF 20-25%, ant/antsept HK, mild MR, mildly dil LA, nl RV fx; d. 08/2018 Cath: D1 80, otw nonobs dzs->Med rx.   Non-obstructive CAD (coronary artery disease)    a. 12/2008 Cath: no significant dzs, EF 45% w/ inf HK->Med Rx; b. 08/2018 Cath: LM nl, LAD min irregs, D1 80, LCX 58md, RCA min irregs->Med Rx.   Osteoarthritis    Poorly controlled Diabetes mellitus    a. 07/2018 A1c 13.1.   Symptomatic cholelithiasis     Past Surgical History:  Procedure Laterality Date   COLONOSCOPY     DILATION AND CURETTAGE OF UTERUS     RIGHT/LEFT HEART CATH AND CORONARY ANGIOGRAPHY N/A 08/26/2018   Procedure: RIGHT/LEFT HEART CATH AND CORONARY ANGIOGRAPHY;  Surgeon: AWellington Hampshire MD;  [facility];  Service: Cardiovascular;  Laterality: N/A;   SHOULDER SURGERY     15 + yrs ago    SKIN SURGERY     nose    VAGINAL HYSTERECTOMY       Home Medications:  Prior to Admission medications   Medication Sig Start Date  Shayon Trompeter Date Taking? Authorizing Provider  acetaminophen (TYLENOL) 325 MG tablet Take 325 mg by mouth daily as needed for moderate pain or headache.   Yes [provider]  carvedilol (COREG) 6.25 MG tablet Take 1 tablet (6.25 mg total) by mouth 2 (two) times daily. 08/22/18  Yes Dunn, Areta Haber, PA-C  chlorpheniramine (CHLOR-TRIMETON) 4 MG tablet Take 4 mg by mouth daily as needed for allergies.   Yes [provider]  FLUoxetine (PROZAC) 20 MG capsule Take 1 capsule (20 mg total) by mouth daily. 02/12/19  Yes Copland, [name], MD  insulin regular human CONCENTRATED (HUMULIN R U-500 KWIKPEN) 500 UNIT/ML kwikpen Inject 60-100 Units into the skin  See admin instructions. Inject 100u under the skin at breakfast and lunch and inject 80u under the skin at dinner - inject 60u if eating a small / protein rich meal   Yes [provider]  Insulin Syringe-Needle U-100 (B-D INS SYRINGE 0.5CC/31GX5/16) 31G X 5/16" 0.5 ML MISC USE AS DIRECTED THREE TIMES A DAY 08/06/17  Yes Copland, [name], MD  ivabradine (CORLANOR) 5 MG TABS tablet Take 1 tablet (5 mg total) by mouth 2 (two) times daily with a meal. 01/23/19  Yes Theora Gianotti, NP  metroNIDAZOLE (METROGEL) 0.75 % gel Apply 1 application topically daily as needed (acne).   Yes [provider]  ONE TOUCH ULTRA TEST test strip USE AS PER PACKAGE INSTRUCTIONS CHECK SUGARS UP TO-FOUR TIMES A DAY OR AS PRESCRIBED 05/21/17  Yes Copland, [name], MD  Probiotic Product (ALIGN) 4 MG CAPS Take 4 mg by mouth daily.    Yes [provider]  sacubitril-valsartan (ENTRESTO) 49-51 MG Take 1 tablet (49/51 mg) by mouth 2 times daily starting on Sunday 09/15/18 01/16/19  Yes Gollan, Kathlene November, MD  NONFORMULARY OR COMPOUNDED ITEM See pharmacy note Patient taking differently: Apply 1 application topically at bedtime as needed (neuropathy). Peripheral Neuropathy Cream-  Bupivacaine 1%, Doxepin 3%, Gabapentin 6%, Pentoxifylline 3%, Topiramate 1%  Apply 1-2 grams to affected area 3-4 times daily  Qty. 120 gm 07/20/17   Edrick Kins, DPM    Inpatient Medications: Scheduled Meds:  Chlorhexidine Gluconate Cloth  6 each Topical Q0600   heparin injection (subcutaneous)  5,000 Units Subcutaneous Q8H   insulin aspart  0-15 Units Subcutaneous TID WC   insulin aspart  0-5 Units Subcutaneous QHS   insulin glargine  21 Units Subcutaneous Daily   mouth rinse  15 mL Mouth Rinse BID   Continuous Infusions:  dextrose 5 % with kcl     PRN Meds: acetaminophen **OR** acetaminophen, metoprolol tartrate, [DISCONTINUED] ondansetron **OR** ondansetron (ZOFRAN) IV  Allergies:    Allergies    Allergen Reactions   Fish Oil Other (See Comments)    Gout   Glimepiride     REACTION: hypoglycemia   Guanfacine Hcl     REACTION: unspecified   Rosiglitazone Other (See Comments)    CHF    Social History:   Social History   Tobacco Use   Smoking status: Former Smoker    Packs/day: 0.75    Years: 30.00    Pack years: 22.50    Types: Cigarettes    Quit date: 06/06/1997    Years since quitting: 21.8   Smokeless tobacco: Never Used  Substance Use Topics   Alcohol use: No   Drug use: No     Family History:   Family History  Problem Relation Age of Onset   Hypertension Father    Colon cancer Neg  Hx    Colon polyps Neg Hx    Rectal cancer Neg Hx    Stomach cancer Neg Hx      ROS:  Review of Systems  Unable to perform ROS: Mental acuity   Physical Exam/Data:   Vitals:   04/21/19 0800 04/21/19 0900 04/21/19 1000 04/21/19 1200  BP: (!) 96/57 124/69 (!) 105/56   Pulse:      Resp: 14 14 (!) 25 19  Temp: 97.8 F (36.6 C)   98.3 F (36.8 C)  TempSrc: Oral   Axillary  SpO2: 96%     Weight:      Height:        Intake/Output Summary (Last 24 hours) at 04/21/2019 1307 Last data filed at 04/21/2019 1000 Gross per 24 hour  Intake 2794.01 ml  Output 700 ml  Net 2094.01 ml   Last 3 Weights 04/19/2019 04/19/2019 02/12/2019  Weight (lbs) 153 lb 15.9 oz 154 lb 154 lb  Weight (kg) 69.85 kg 69.854 kg 69.854 kg     Body mass index is 29.58 kg/m.  General:  Well nourished, well developed, in no acute distress HEENT: normal Lymph: no adenopathy Neck: no JVD Endocrine:  No thryomegaly Vascular: No carotid bruits; FA pulses 2+ bilaterally without bruits  Cardiac: Regular rate and rhythm without murmurs, rubs, or gallops. Lungs: Mildly diminished breath sounds throughout with faint bibasilar crackles. Abd: soft, nontender, no hepatomegaly  Ext: no edema Musculoskeletal:  No deformities. Skin: warm and dry  Neuro: Cranial nerves III through XII intact.   Patient with word finding difficulty.  Moves all 4 extremities. Psych: Unable to assess due to mental acuity.  EKG:  The EKG performed 04/19/2019 was personally reviewed and demonstrates: Wide-complex tachycardia; patient known to have LBBB at baseline. Telemetry:  Telemetry was personally reviewed and demonstrates: Normal sinus rhythm and sinus tachycardia with baseline interventricular conduction delay  Relevant CV Studies: TTE (04/21/2019): Technically difficult study.  1. Severe hypokinesis of the left ventricular, mid-apical anterior wall and anteroseptal wall.  2. The left ventricle has moderately reduced systolic function, with an ejection fraction of 35-40%. The cavity size was normal. Left ventricular diastolic function could not be evaluated.  3. The right ventricle has normal systolic function. The cavity was normal. There is mildly increased right ventricular wall thickness.  4. Left atrial size was not well visualized.  5. The aortic valve is tricuspid. Mild thickening of the aortic valve. Aortic valve regurgitation was not assessed by color flow Doppler. Mild aortic annular calcification noted.  6. The mitral valve was not well visualized. There is mild mitral annular calcification present.  7. The aortic root is normal in size and structure.  8. The interatrial septum was not well visualized.  TTE (12/10/2018):  1. The left ventricle has low normal systolic function, with an ejection fraction of 50-55%. The cavity size was normal. There is moderate concentric left ventricular hypertrophy. Left ventricular diastolic Doppler parameters are consistent with  impaired relaxation.  2. The right ventricle has normal systolic function. The cavity was normal. There is no increase in right ventricular wall thickness.  3. Left atrial size was mildly dilated.  4. The mitral valve is grossly normal.  5. The tricuspid valve is grossly normal.  6. The aortic valve is grossly normal.  7.  Significant improvement in EF since last echo.  R/LHC (08/03/2018): 1.  Significant one-vessel coronary artery disease involving first diagonal with mild to moderate disease in the main vessels.   2.  Left ventricular angiography was not performed.  EF was severely reduced by echo. 3.  Right heart catheterization showed normal filling pressures, normal pulmonary pressure and mildly reduced cardiac output at 3.91 with a cardiac index of 2.23.  Laboratory Data:  High Sensitivity Troponin:   Recent Labs  Lab 04/20/19 0413 04/20/19 0850  TROPONINIHS 5,057* 5,667*     Cardiac EnzymesNo results for input(s): TROPONINI in the last 168 hours. No results for input(s): TROPIPOC in the last 168 hours.  Chemistry Recent Labs  Lab 04/20/19 0850 04/20/19 1447 04/21/19 0026 04/21/19 0648  NA 145  --  145 145  K 3.1* 2.9* 3.3* 2.8*  CL 104  --  99 97*  CO2 28  --  34* 37*  GLUCOSE 238*  --  187* 208*  BUN 30*  --  31* 29*  CREATININE 1.32*  --  1.42* 1.71*  CALCIUM 8.8*  --  8.1* 7.8*  GFRNONAA 42*  --  38* 30*  GFRAA 48*  --  44* 35*  ANIONGAP 13  --  12 11    Recent Labs  Lab 04/19/19 1854  PROT 7.7  ALBUMIN 3.9  AST 45*  ALT 26  ALKPHOS 146*  BILITOT 1.9*   Hematology Recent Labs  Lab 04/19/19 1854 04/20/19 0414 04/21/19 0648  WBC 25.9* 28.4* 18.4*  RBC 5.42* 4.73 4.27  HGB 16.1* 14.3 12.8  HCT 53.0* 42.1 39.3  MCV 97.8 89.0 92.0  MCH 29.7 30.2 30.0  MCHC 30.4 34.0 32.6  RDW 14.0 13.3 14.0  PLT 266 205 139*   BNPNo results for input(s): BNP, PROBNP in the last 168 hours.  DDimer No results for input(s): DDIMER in the last 168 hours.   Radiology/Studies:  Ct Head Wo Contrast  Result Date: 04/19/2019 CLINICAL DATA:  Altered level of consciousness (LOC), unexplained AMS; Neck pain, chronic, abn neuro exam, neg xray AMS. Found unresponsive. EXAM: CT HEAD WITHOUT CONTRAST CT CERVICAL SPINE WITHOUT CONTRAST TECHNIQUE: Multidetector CT imaging of the head and cervical  spine was performed following the standard protocol without intravenous contrast. Multiplanar CT image reconstructions of the cervical spine were also generated. COMPARISON:  Head CT 10/30/2018 FINDINGS: CT HEAD FINDINGS Brain: No intracranial hemorrhage, mass effect, or midline shift. No hydrocephalus. The basilar cisterns are patent. No evidence of territorial infarct or acute ischemia. Unchanged degree of atrophy and mild chronic small vessel ischemia. No extra-axial or intracranial fluid collection. Vascular: No hyperdense vessel. Skull: No fracture or focal lesion. Sinuses/Orbits: Paranasal sinuses and mastoid air cells are clear. The visualized orbits are unremarkable. Other: None. CT CERVICAL SPINE FINDINGS Alignment: Trace anterolisthesis of C4 on C5 is likely facet mediated. Alignment otherwise maintained. Skull base and vertebrae: No acute fracture. Vertebral body heights are maintained. The dens and skull base are intact. Incidental vertebral body hemangioma within C4. Soft tissues and spinal canal: No prevertebral fluid or swelling. No visible canal hematoma. Disc levels: Disc space narrowing and endplate spurring at Q7-R9 and C6-C7. Multilevel facet hypertrophy. No significant spinal canal stenosis. Bilateral neural foraminal stenosis at C5-C6 and right neural foraminal stenosis at C6-C7. Upper chest: 11 mm right thyroid nodule. No dedicated imaging follow-up is needed due to size. Other: None. IMPRESSION: 1. No acute intracranial abnormality. Mild atrophy and chronic small vessel ischemia, similar to prior exam. 2. Multilevel degenerative change in the cervical spine without acute abnormality. Electronically Signed   By: Keith Rake M.D.   On: 04/19/2019 19:41   Ct Cervical Spine Wo Contrast  Result Date: 04/19/2019 CLINICAL DATA:  Altered level of consciousness (LOC), unexplained AMS; Neck pain, chronic, abn neuro exam, neg xray AMS. Found unresponsive. EXAM: CT HEAD WITHOUT CONTRAST CT  CERVICAL SPINE WITHOUT CONTRAST TECHNIQUE: Multidetector CT imaging of the head and cervical spine was performed following the standard protocol without intravenous contrast. Multiplanar CT image reconstructions of the cervical spine were also generated. COMPARISON:  Head CT 10/30/2018 FINDINGS: CT HEAD FINDINGS Brain: No intracranial hemorrhage, mass effect, or midline shift. No hydrocephalus. The basilar cisterns are patent. No evidence of territorial infarct or acute ischemia. Unchanged degree of atrophy and mild chronic small vessel ischemia. No extra-axial or intracranial fluid collection. Vascular: No hyperdense vessel. Skull: No fracture or focal lesion. Sinuses/Orbits: Paranasal sinuses and mastoid air cells are clear. The visualized orbits are unremarkable. Other: None. CT CERVICAL SPINE FINDINGS Alignment: Trace anterolisthesis of C4 on C5 is likely facet mediated. Alignment otherwise maintained. Skull base and vertebrae: No acute fracture. Vertebral body heights are maintained. The dens and skull base are intact. Incidental vertebral body hemangioma within C4. Soft tissues and spinal canal: No prevertebral fluid or swelling. No visible canal hematoma. Disc levels: Disc space narrowing and endplate spurring at K7-Q2 and C6-C7. Multilevel facet hypertrophy. No significant spinal canal stenosis. Bilateral neural foraminal stenosis at C5-C6 and right neural foraminal stenosis at C6-C7. Upper chest: 11 mm right thyroid nodule. No dedicated imaging follow-up is needed due to size. Other: None. IMPRESSION: 1. No acute intracranial abnormality. Mild atrophy and chronic small vessel ischemia, similar to prior exam. 2. Multilevel degenerative change in the cervical spine without acute abnormality. Electronically Signed   By: Keith Rake M.D.   On: 04/19/2019 19:41   Mr Brain Wo Contrast  Result Date: 04/20/2019 CLINICAL DATA:  Recent fall. Altered mental status. Negative CT evaluation. EXAM: MRI HEAD  WITHOUT CONTRAST TECHNIQUE: Multiplanar, multiecho pulse sequences of the brain and surrounding structures were obtained without intravenous contrast. COMPARISON:  CT 04/19/2019 FINDINGS: Brain: Diffusion imaging does not show any acute or subacute infarction. The brainstem and cerebellum are normal. Cerebral hemispheres show mild chronic small-vessel ischemic change of the white matter. No cortical or large vessel territory infarction. No mass lesion, hemorrhage, hydrocephalus or extra-axial collection. Vascular: Major vessels at the base of the brain show flow. Skull and upper cervical spine: Negative Sinuses/Orbits: Clear/normal Other: None IMPRESSION: No acute or significant finding. Mild chronic small-vessel ischemic change of the hemispheric white matter, often seen at this age. Electronically Signed   By: Nelson Chimes M.D.   On: 04/20/2019 14:32   Dg Chest Port 1 View  Result Date: 04/19/2019 CLINICAL DATA:  Altered mental status. EXAM: PORTABLE CHEST 1 VIEW COMPARISON:  October 30, 2018 FINDINGS: The heart size and mediastinal contours are within normal limits. Both lungs are clear. The visualized skeletal structures are unremarkable. IMPRESSION: No active disease. Electronically Signed   By: Constance Holster M.D.   On: 04/19/2019 19:42    Assessment and Plan:   Elevated troponin: Incidentally noted with patient making note of chest pain in the past, though time course of the pain is unclear in the setting of altered mental status.  She denies any pain at this time.  Echocardiogram is notable for moderately reduced LVEF with severe hypokinesis in the LAD territory.  Catheterization last year was notable for mild to moderate disease involving first diagonal branch and mid LCx.  Trend troponin every 6 hours until it has peaked, then stop.  Administer aspirin 325 mg x  1 followed by 81 mg daily.  Initiate heparin infusion, to be continued 48-72 hours.  Start high intensity statin  therapy.  Patient would benefit from a repeat cardiac catheterization during this hospitalization.  However, I would prefer delaying this in the setting of recent DKA with acute kidney injury and altered mental status.  Additionally, the patient is chest pain-free at this time.  Acute on chronic HFrEF: Patient known to have severely reduced LVEF in the past due to nonischemic cardiomyopathy.  However, with medical therapy, LVEF normalized on most recent echo in March.  It is moderately reduced today with LAD territory hypokinesis.  The patient appears euvolemic on exam today.  Initiate carvedilol 3.125 mg twice daily.  Defer restarting Entresto in the setting of recent hypotension and acute kidney injury.  Consider catheterization prior to discharge, as above.  Sinus tachycardia: Longstanding issue, with the patient having been on carvedilol and ivabradine as an outpatient.  Restart carvedilol, as above.  Defer adding ivabradine for now.  Diabetic ketoacidosis: Resolved.  Ongoing management of poorly controlled diabetes per internal medicine and CCM.    Acute kidney injury: Creatinine improved since admission but slightly higher today than yesterday and still well-above baseline.  Continue gentle hydration.  We will need to be cautious in the setting of newly reduced LVEF.  Avoid nephrotoxic drugs.  For questions or updates, please contact [place] Please consult [email or web address] for contact info under [facility].  Signed, Nelva Bush, MD  04/21/2019 1:07 PM

## 2019-04-21 NOTE — Progress Notes (Addendum)
[place] for heparin Indication: chest pain/ACS  Allergies  Allergen Reactions  . Fish Oil Other (See Comments)    Gout  . Glimepiride     REACTION: hypoglycemia  . Guanfacine Hcl     REACTION: unspecified  . Rosiglitazone Other (See Comments)    CHF    Patient Measurements: Height: 5' 0.5" (153.7 cm) Weight: 153 lb 15.9 oz (69.8 kg) IBW/kg (Calculated) : 46.65 Heparin Dosing Weight: 61.8 kg  Vital Signs: Temp: 98.3 F (36.8 C) (07/20 1200) Temp Source: Axillary (07/20 1200) BP: 119/53 (07/20 1300) Pulse Rate: 101 (07/20 0600)  Labs: Recent Labs    04/19/19 1854  04/20/19 0413 04/20/19 0414 04/20/19 0850 04/21/19 0026 04/21/19 0648  HGB 16.1*  --   --  14.3  --   --  12.8  HCT 53.0*  --   --  42.1  --   --  39.3  PLT 266  --   --  205  --   --  139*  APTT 31  --   --   --   --   --   --   LABPROT 14.8  --   --   --   --   --   --   INR 1.2  --   --   --   --   --   --   CREATININE 2.26*   < >  --  1.51* 1.32* 1.42* 1.71*  CKTOTAL 190  --   --   --   --   --   --   TROPONINIHS  --   --  5,057*  --  5,667*  --   --    < > = values in this interval not displayed.    Estimated Creatinine Clearance: 28.2 mL/min (A) (by C-G formula based on SCr of 1.71 mg/dL (H)).   Medical History: Past Medical History:  Diagnosis Date  . Allergic rhinitis   . Allergy   . Cataract    mild   . Crohn's disease ([place]) 10/13/2013  . Diverticulosis   . GERD (gastroesophageal reflux disease)   . Gout   . HFrEF (heart failure with reduced ejection fraction) ([place])    a. 12/2008 Cath: EF 45% w/ inf HK; b. 07/2009 Echo: EF 50-55%; c. 08/2018 Echo: EF 20-25%.  Marland Kitchen Hyperlipidemia   . Hypertension   . IBS (irritable bowel syndrome)   . Left bundle branch block   . Neuromuscular disorder (HCC)    neuropathy  . NICM (nonischemic cardiomyopathy) ([place])    a. 12/2008 Cath: no significant dzs, EF 45% w/ inf HK->Med Rx; b. 07/2009 Echo: EF 50-55%; c.  08/2018 Echo: EF 20-25%, ant/antsept HK, mild MR, mildly dil LA, nl RV fx; d. 08/2018 Cath: D1 80, otw nonobs dzs->Med rx.  . Non-obstructive CAD (coronary artery disease)    a. 12/2008 Cath: no significant dzs, EF 45% w/ inf HK->Med Rx; b. 08/2018 Cath: LM nl, LAD min irregs, D1 80, LCX 42md, RCA min irregs->Med Rx.  . Osteoarthritis   . Poorly controlled Diabetes mellitus    a. 07/2018 A1c 13.1.  .Marland KitchenSymptomatic cholelithiasis      Assessment: 68year old female admitted with DKA. Patient complains of chest pain with rise in troponin. Pharmacy consulted for heparin drip while patient confused and with AKI. Baseline APTT, INR and Hg are WNL. Platelets trending down, will continue to monitor.   Goal of Therapy:  Heparin level 0.3-0.7  units/ml Monitor platelets by anticoagulation protocol: Yes   Plan:  Will give heparin 3700 unit bolus x 1 followed by drip at 700 units/hr. Will check HL 7/21 at 0100. CBC with morning labs.  Tawnya Crook, PharmD Clinical Pharmacist 04/21/2019,2:44 PM

## 2019-04-21 NOTE — Progress Notes (Signed)
*  PRELIMINARY RESULTS* Echocardiogram 2D Echocardiogram has been performed.  Kristy [name] 04/21/2019, 8:40 AM

## 2019-04-21 NOTE — Progress Notes (Signed)
Called daughter, Jocelyn Lamer, to provide update with no answer. Left a voicemail.

## 2019-04-21 NOTE — Progress Notes (Signed)
Took over care of patient at 15:00. Patient confused but cheerful. Forgetful.  Takes off medical equipment (like pulse oximetry and nasal cannula) and twice tried to get out of bed by herself. Verbally redirectable. Updated patient's daughter via phone.

## 2019-04-22 DIAGNOSIS — J9601 Acute respiratory failure with hypoxia: Secondary | ICD-10-CM

## 2019-04-22 DIAGNOSIS — E1110 Type 2 diabetes mellitus with ketoacidosis without coma: Secondary | ICD-10-CM

## 2019-04-22 DIAGNOSIS — G9340 Encephalopathy, unspecified: Secondary | ICD-10-CM

## 2019-04-22 DIAGNOSIS — N179 Acute kidney failure, unspecified: Secondary | ICD-10-CM

## 2019-04-22 LAB — COMPREHENSIVE METABOLIC PANEL
ALT: 21 U/L (ref 0–44)
AST: 45 U/L — ABNORMAL HIGH (ref 15–41)
Albumin: 2.6 g/dL — ABNORMAL LOW (ref 3.5–5.0)
Alkaline Phosphatase: 74 U/L (ref 38–126)
Anion gap: 11 (ref 5–15)
BUN: 19 mg/dL (ref 8–23)
CO2: 32 mmol/L (ref 22–32)
Calcium: 7.5 mg/dL — ABNORMAL LOW (ref 8.9–10.3)
Chloride: 95 mmol/L — ABNORMAL LOW (ref 98–111)
Creatinine, Ser: 1.2 mg/dL — ABNORMAL HIGH (ref 0.44–1.00)
GFR calc Af Amer: 54 mL/min — ABNORMAL LOW (ref >60)
GFR calc non Af Amer: 47 mL/min — ABNORMAL LOW (ref >60)
Glucose, Bld: 350 mg/dL — ABNORMAL HIGH (ref 70–99)
Potassium: 3.5 mmol/L (ref 3.5–5.1)
Sodium: 138 mmol/L (ref 135–145)
Total Bilirubin: 1.3 mg/dL — ABNORMAL HIGH (ref 0.3–1.2)
Total Protein: 5.2 g/dL — ABNORMAL LOW (ref 6.5–8.1)

## 2019-04-22 LAB — LIPID PANEL
Cholesterol: 170 mg/dL (ref 0–200)
HDL: 25 mg/dL — ABNORMAL LOW (ref >40)
LDL Cholesterol: 103 mg/dL — ABNORMAL HIGH (ref 0–99)
Total CHOL/HDL Ratio: 6.8 RATIO
Triglycerides: 209 mg/dL — ABNORMAL HIGH (ref <150)
VLDL: 42 mg/dL — ABNORMAL HIGH (ref 0–40)

## 2019-04-22 LAB — CBC
HCT: 37 % (ref 36.0–46.0)
Hemoglobin: 11.9 g/dL — ABNORMAL LOW (ref 12.0–15.0)
MCH: 30.3 pg (ref 26.0–34.0)
MCHC: 32.2 g/dL (ref 30.0–36.0)
MCV: 94.1 fL (ref 80.0–100.0)
Platelets: 110 10*3/uL — ABNORMAL LOW (ref 150–400)
RBC: 3.93 MIL/uL (ref 3.87–5.11)
RDW: 14.1 % (ref 11.5–15.5)
WBC: 13.5 10*3/uL — ABNORMAL HIGH (ref 4.0–10.5)
nRBC: 0.2 % (ref 0.0–0.2)

## 2019-04-22 LAB — HEPARIN LEVEL (UNFRACTIONATED)
Heparin Unfractionated: 0.25 IU/mL — ABNORMAL LOW (ref 0.30–0.70)
Heparin Unfractionated: 0.37 IU/mL (ref 0.30–0.70)
Heparin Unfractionated: 0.53 IU/mL (ref 0.30–0.70)
Heparin Unfractionated: 0.76 IU/mL — ABNORMAL HIGH (ref 0.30–0.70)

## 2019-04-22 LAB — GLUCOSE, CAPILLARY
Glucose-Capillary: >600 mg/dL (ref 70–99)
Glucose-Capillary: 336 mg/dL — ABNORMAL HIGH (ref 70–99)
Glucose-Capillary: 420 mg/dL — ABNORMAL HIGH (ref 70–99)
Glucose-Capillary: 145 mg/dL — ABNORMAL HIGH (ref 70–99)
Glucose-Capillary: 145 mg/dL — ABNORMAL HIGH (ref 70–99)

## 2019-04-22 LAB — AMYLASE: Amylase: 63 U/L (ref 28–100)

## 2019-04-22 MED ORDER — INSULIN GLARGINE 100 UNIT/ML ~~LOC~~ SOLN
15.0000 [IU] | Freq: Once | SUBCUTANEOUS | Status: DC
  Filled 2019-04-22: qty 0.15

## 2019-04-22 MED ORDER — INSULIN REGULAR HUMAN (CONC) 500 UNIT/ML ~~LOC~~ SOPN
40.0000 [IU] | PEN_INJECTOR | Freq: Every day | SUBCUTANEOUS | Status: DC
  Administered 2019-04-23 – 2019-04-26 (×4): 40 [IU] via SUBCUTANEOUS
  Filled 2019-04-22: qty 3

## 2019-04-22 MED ORDER — IVABRADINE HCL 5 MG PO TABS
5.0000 mg | ORAL_TABLET | Freq: Two times a day (BID) | ORAL | Status: DC
  Administered 2019-04-22 – 2019-04-28 (×12): 5 mg via ORAL
  Filled 2019-04-22 (×14): qty 1

## 2019-04-22 MED ORDER — INSULIN REGULAR HUMAN (CONC) 500 UNIT/ML ~~LOC~~ SOPN
50.0000 [IU] | PEN_INJECTOR | Freq: Every day | SUBCUTANEOUS | Status: DC
  Administered 2019-04-23 – 2019-04-26 (×3): 50 [IU] via SUBCUTANEOUS
  Filled 2019-04-22: qty 3

## 2019-04-22 MED ORDER — POTASSIUM CHLORIDE CRYS ER 20 MEQ PO TBCR
40.0000 meq | EXTENDED_RELEASE_TABLET | Freq: Once | ORAL | Status: AC
  Administered 2019-04-22: 40 meq via ORAL
  Filled 2019-04-22: qty 2

## 2019-04-22 MED ORDER — FUROSEMIDE 20 MG PO TABS
20.0000 mg | ORAL_TABLET | Freq: Once | ORAL | Status: AC
  Administered 2019-04-22: 20 mg via ORAL
  Filled 2019-04-22: qty 1

## 2019-04-22 MED ORDER — INSULIN ASPART 100 UNIT/ML ~~LOC~~ SOLN
15.0000 [IU] | Freq: Once | SUBCUTANEOUS | Status: AC
  Administered 2019-04-22: 15 [IU] via SUBCUTANEOUS
  Filled 2019-04-22: qty 1

## 2019-04-22 NOTE — Progress Notes (Signed)
[place] [place] NAME: Kristy [name]    MR#:  [id number]  DATE OF BIRTH:  [birth year]  SUBJECTIVE:  CHIEF COMPLAINT:   Chief Complaint  Patient presents with  . Altered Mental Status   Came with altered mental status and noted to be in DKA with elevated troponin and lipase. Remains confused.  completely alert now.  REVIEW OF SYSTEMS:   Due to AMS , can not give ROS.  ROS  DRUG ALLERGIES:   Allergies  Allergen Reactions  . Fish Oil Other (See Comments)    Gout  . Glimepiride     REACTION: hypoglycemia  . Guanfacine Hcl     REACTION: unspecified  . Rosiglitazone Other (See Comments)    CHF    VITALS:  Blood pressure 120/69, pulse 79, temperature 98.2 F (36.8 C), temperature source Oral, resp. rate 20, height 5' 5"  (1.651 m), weight 74.3 kg, SpO2 97 %.  PHYSICAL EXAMINATION:   GENERAL:  68 y.o.-year-old patient lying in the bed with shallow breaths EYES: Pupils equal, round, reactive to light and accommodation. No scleral icterus.  HEENT: Head atraumatic, normocephalic. Oropharynx and nasopharynx clear.  NECK:  Supple, no jugular venous distention. No thyroid enlargement, no tenderness.  LUNGS: Normal breath sounds bilaterally, no wheezing, rales,rhonchi or crepitation. No use of accessory muscles of respiration.  CARDIOVASCULAR: S1, S2 normal. No murmurs, rubs, or gallops.  ABDOMEN: Soft, nontender, nondistended. Bowel sounds present. No organomegaly or mass.  EXTREMITIES: No pedal edema, cyanosis, or clubbing.  NEUROLOGIC: Patient moving all of her extremities on her own, not following commands. PSYCHIATRIC: The patient is alert but confused.  SKIN: No rash, lesion, or ulcer.   Physical Exam LABORATORY PANEL:   CBC Recent Labs  Lab 04/22/19 0700  WBC 13.5*  HGB 11.9*  HCT 37.0  PLT 110*    ------------------------------------------------------------------------------------------------------------------  Chemistries  Recent Labs  Lab 04/21/19 0648  04/22/19 0700  NA 145   < > 138  K 2.8*   < > 3.5  CL 97*   < > 95*  CO2 37*   < > 32  GLUCOSE 208*   < > 350*  BUN 29*   < > 19  CREATININE 1.71*   < > 1.20*  CALCIUM 7.8*   < > 7.5*  MG 1.8  --   --   AST  --   --  45*  ALT  --   --  21  ALKPHOS  --   --  74  BILITOT  --   --  1.3*   < > = values in this interval not displayed.   ------------------------------------------------------------------------------------------------------------------  Cardiac Enzymes No results for input(s): TROPONINI in the last 168 hours. ------------------------------------------------------------------------------------------------------------------  RADIOLOGY:  Dg Chest Port 1 View  Result Date: 04/21/2019 CLINICAL DATA:  Acute respiratory failure EXAM: PORTABLE CHEST 1 VIEW COMPARISON:  None. FINDINGS: Cardiac shadow is mildly enlarged but stable. Lungs are well aerated bilaterally. No focal infiltrate or sizable effusion is seen. No bony abnormality is noted. IMPRESSION: No acute abnormality noted. Electronically Signed   By: Inez Catalina M.D.   On: 04/21/2019 21:53    ASSESSMENT AND PLAN:   Active Problems:   Acute on chronic HFrEF (heart failure with reduced ejection fraction) (HCC)   DKA (diabetic ketoacidoses) (HCC)   Pneumonia   Sepsis ([place])   Elevated troponin  1.  Diabetic ketoacidosis.  Placed on insulin drip.   Serial BMPs.   Manage  per ICU team.   Resolved. 2.  Acute encephalopathy.  Could be secondary to diabetic ketoacidosis or sepsis.  CT scan of the head negative.  Continue to monitor.  MRI brain negative. Seen by Neurology.   EEG negative. I spoke to patient's daughter on the phone as per call patient was living alone but sometimes they have noticed that she forget the days or she forgets some details when  they are talking to her.  3.  Clinical sepsis.  Unclear cause.  Patient has fever and leukocytosis and tachycardia.  Empiric antibiotics.  Follow-up cultures.  Follow-up urine. Might do CT once pt is stable. 4.  Wide-complex tachycardia.  Patient has had a history of left bundle branch block.  Has seen [facility] cardiology in the past if needed.   Have High troponin, but as per intencivist- likely due to Sepsis and as troponin stable, cont to monitor.  Seen by cardiology - suggested to start heparine drip and possible Cath once renal func stable. 5.  History of nonischemic cardiomyopathy. Ac on ch systolic CHF    Have renal failure, cardio suggest to start Coreg, wait for enteresto and do cath once renal func improve. 6.  Hypertension 7.  Hyperlipidemia unspecified 8.  GERD 9.  History of Crohn's disease   All the records are reviewed and case discussed with Care Management/Social Workerr. Management plans discussed with the patient, family and they are in agreement.  CODE STATUS: Full.  TOTAL TIME TAKING CARE OF THIS PATIENT: 35 minutes.   POSSIBLE D/C IN 1-2 DAYS, DEPENDING ON CLINICAL CONDITION.   Vaughan Basta M.D on 04/22/2019   Between 7am to 6pm - Pager - [phone]  After 6pm go to [email or web address] - password [email or web address] [facility]  Office  [phone]  CC: Primary care physician; Owens Loffler, MD  Note: This dictation was prepared with Dragon dictation along with smaller phrase technology. Any transcriptional errors that result from this process are unintentional.

## 2019-04-22 NOTE — Progress Notes (Signed)
[place] for heparin Indication: chest pain/ACS  Allergies  Allergen Reactions  . Fish Oil Other (See Comments)    Gout  . Glimepiride     REACTION: hypoglycemia  . Guanfacine Hcl     REACTION: unspecified  . Rosiglitazone Other (See Comments)    CHF    Patient Measurements: Height: 5' 0.5" (153.7 cm) Weight: 153 lb 15.9 oz (69.8 kg) IBW/kg (Calculated) : 46.65 Heparin Dosing Weight: 61.8 kg  Vital Signs: Temp: 98.1 F (36.7 C) (07/21 0000) BP: 132/77 (07/21 0000) Pulse Rate: 107 (07/21 0000)  Labs: Recent Labs    04/19/19 1854  04/20/19 0413 04/20/19 0414 04/20/19 0850 04/21/19 0026 04/21/19 0648 04/21/19 1447 04/21/19 1856 04/22/19 0015  HGB 16.1*  --   --  14.3  --   --  12.8  --   --   --   HCT 53.0*  --   --  42.1  --   --  39.3  --   --   --   PLT 266  --   --  205  --   --  139*  --   --   --   APTT 31  --   --   --   --   --   --   --   --   --   LABPROT 14.8  --   --   --   --   --   --   --   --   --   INR 1.2  --   --   --   --   --   --   --   --   --   HEPARINUNFRC  --   --   --   --   --   --   --   --   --  0.76*  CREATININE 2.26*   < >  --  1.51* 1.32* 1.42* 1.71* 1.51*  --   --   CKTOTAL 190  --   --   --   --   --   --   --   --   --   TROPONINIHS  --   --  5,057*  --  5,667*  --   --   --  2,950*  --    < > = values in this interval not displayed.    Estimated Creatinine Clearance: 32 mL/min (A) (by C-G formula based on SCr of 1.51 mg/dL (H)).   Medical History: Past Medical History:  Diagnosis Date  . Allergic rhinitis   . Allergy   . Cataract    mild   . Crohn's disease ([place]) 10/13/2013  . Diverticulosis   . GERD (gastroesophageal reflux disease)   . Gout   . HFrEF (heart failure with reduced ejection fraction) ([place])    a. 12/2008 Cath: EF 45% w/ inf HK; b. 07/2009 Echo: EF 50-55%; c. 08/2018 Echo: EF 20-25%.  Marland Kitchen Hyperlipidemia   . Hypertension   . IBS (irritable bowel syndrome)   . Left  bundle branch block   . Neuromuscular disorder (HCC)    neuropathy  . NICM (nonischemic cardiomyopathy) ([place])    a. 12/2008 Cath: no significant dzs, EF 45% w/ inf HK->Med Rx; b. 07/2009 Echo: EF 50-55%; c. 08/2018 Echo: EF 20-25%, ant/antsept HK, mild MR, mildly dil LA, nl RV fx; d. 08/2018 Cath: D1 80, otw nonobs dzs->Med rx.  . Non-obstructive CAD (coronary artery disease)  a. 12/2008 Cath: no significant dzs, EF 45% w/ inf HK->Med Rx; b. 08/2018 Cath: LM nl, LAD min irregs, D1 80, LCX 45md, RCA min irregs->Med Rx.  . Osteoarthritis   . Poorly controlled Diabetes mellitus    a. 07/2018 A1c 13.1.  .Marland KitchenSymptomatic cholelithiasis      Assessment: 68year old female admitted with DKA. Patient complains of chest pain with rise in troponin. Pharmacy consulted for heparin drip while patient confused and with AKI. Baseline APTT, INR and Hg are WNL. Platelets trending down, will continue to monitor.  7/20 Heparin infusion started @ 700 units/hr     Goal of Therapy:  Heparin level 0.3-0.7 units/ml Monitor platelets by anticoagulation protocol: Yes   Plan:  7/21 @ 0015 HL: 0.76. Level is supratherapeutic.  Will decrease heparin infusion to 600 units/hr.  Will recheck heparin level 6 hours after infusion rate change.  CBC with morning labs per protocol.   SPernell Dupre PharmD, BCPS Clinical Pharmacist 04/22/2019 12:44 AM

## 2019-04-22 NOTE — Consult Note (Signed)
Urology Inpatient Progress Note  Subjective: Kristy [name] is a 68 y.o. female admitted with DKA on 04/19/2019 and subsequently found to have urinary retention requiring bedside sensor wire-assisted Foley catheterization by Dr. Bernardo Heater on 04/20/2019. Dr. Bernardo Heater found the patient to have a urethral stricture requiring catheterization for at least five days.  Catheter has been in place two days and urine output is clear yellow in appearance. Patient continues to be somewhat disoriented; she states she had plans today and wishes "this" weren't happening.  WBC down to 13.5 today; creatinine down to 1.20. Urine cultures from 7/19 and blood cultures from 7/18 with no growth.  Anti-infectives: Anti-infectives (From admission, onward)   Start     Dose/Rate Route Frequency Ordered Stop   04/20/19 2000  ceFEPIme (MAXIPIME) 2 g in sodium chloride 0.9 % 100 mL IVPB  Status:  Discontinued     2 g 200 mL/hr over 30 Minutes Intravenous Every 24 hours 04/19/19 2356 04/20/19 0821   04/20/19 0830  piperacillin-tazobactam (ZOSYN) IVPB 3.375 g  Status:  Discontinued     3.375 g 12.5 mL/hr over 240 Minutes Intravenous Every 8 hours 04/20/19 0821 04/21/19 0939   04/20/19 0730  vancomycin (VANCOCIN) IVPB 750 mg/150 ml premix  Status:  Discontinued     750 mg 150 mL/hr over 60 Minutes Intravenous  Once 04/20/19 0725 04/20/19 0935   04/20/19 0400  metroNIDAZOLE (FLAGYL) IVPB 500 mg  Status:  Discontinued     500 mg 100 mL/hr over 60 Minutes Intravenous Every 8 hours 04/19/19 2358 04/20/19 0821   04/19/19 2015  vancomycin (VANCOCIN) 500 mg in sodium chloride 0.9 % 100 mL IVPB  Status:  Discontinued     500 mg 100 mL/hr over 60 Minutes Intravenous  Once 04/19/19 2003 04/19/19 2006   04/19/19 2015  vancomycin (VANCOCIN) 500 mg in sodium chloride 0.9 % 100 mL IVPB     500 mg 100 mL/hr over 60 Minutes Intravenous  Once 04/19/19 2006 04/19/19 2305   04/19/19 1900  ceFEPIme (MAXIPIME) 2 g in sodium chloride 0.9 %  100 mL IVPB     2 g 200 mL/hr over 30 Minutes Intravenous  Once 04/19/19 1849 04/19/19 2049   04/19/19 1900  metroNIDAZOLE (FLAGYL) IVPB 500 mg     500 mg 100 mL/hr over 60 Minutes Intravenous  Once 04/19/19 1849 04/19/19 2055   04/19/19 1900  vancomycin (VANCOCIN) IVPB 1000 mg/200 mL premix     1,000 mg 200 mL/hr over 60 Minutes Intravenous  Once 04/19/19 1849 04/19/19 2147      Current Facility-Administered Medications  Medication Dose Route Frequency Provider Last Rate Last Dose  . acetaminophen (TYLENOL) tablet 650 mg  650 mg Oral Q6H PRN Loletha Grayer, MD       Or  . acetaminophen (TYLENOL) suppository 650 mg  650 mg Rectal Q6H PRN Loletha Grayer, MD      . aspirin EC tablet 81 mg  81 mg Oral Daily End, Christopher, MD   81 mg at 04/22/19 0951  . atorvastatin (LIPITOR) tablet 80 mg  80 mg Oral q1800 End, Christopher, MD   80 mg at 04/21/19 1717  . carvedilol (COREG) tablet 3.125 mg  3.125 mg Oral BID WC End, Christopher, MD   3.125 mg at 04/22/19 4098  . Chlorhexidine Gluconate Cloth 2 % PADS 6 each  6 each Topical Q0600 Tukov-Yual, Arlyss Gandy, NP   6 each at 04/22/19 [phone]  . famotidine (PEPCID) tablet 20 mg  20 mg Oral  Daily Awilda Bill, NP   20 mg at 04/22/19 0951  . heparin ADULT infusion 100 units/mL (25000 units/241m sodium chloride 0.45%)  600 Units/hr Intravenous Continuous Hallaji, Sheema M, RPH 6 mL/hr at 04/22/19 0800 600 Units/hr at 04/22/19 0800  . insulin aspart (novoLOG) injection 0-15 Units  0-15 Units Subcutaneous TID WC SWilhelmina Mcardle MD   11 Units at 04/22/19 [phone] . insulin aspart (novoLOG) injection 0-5 Units  0-5 Units Subcutaneous QHS SWilhelmina Mcardle MD   3 Units at 04/21/19 2148  . insulin aspart (novoLOG) injection 5 Units  5 Units Subcutaneous TID WC BAwilda Bill NP   5 Units at 04/22/19 [phone] . insulin glargine (LANTUS) injection 15 Units  15 Units Subcutaneous Once SWilhelmina Mcardle MD      . insulin glargine (LANTUS) injection 30 Units   30 Units Subcutaneous Daily BAwilda Bill NP   30 Units at 04/22/19 0951  . ivabradine (CORLANOR) tablet 5 mg  5 mg Oral BID WC DChristell FaithM, PA-C   5 mg at 04/22/19 07867 . MEDLINE mouth rinse  15 mL Mouth Rinse BID Tukov-Yual, Magdalene S, NP   15 mL at 04/22/19 0951  . metoprolol tartrate (LOPRESSOR) injection 2.5 mg  2.5 mg Intravenous Q2H PRN Tukov-Yual, Magdalene S, NP   2.5 mg at 04/20/19 0646  . morphine 2 MG/ML injection 1-2 mg  1-2 mg Intravenous Q4H PRN BAwilda Bill NP   2 mg at 04/21/19 2052  . ondansetron (ZOFRAN) injection 4 mg  4 mg Intravenous Q6H PRN WLoletha Grayer MD      . traZODone (DESYREL) tablet 50 mg  50 mg Oral QHS PRN BAwilda Bill NP   50 mg at 04/21/19 2221   Objective: Vital signs in last 24 hours: Temp:  [97.9 F (36.6 C)-98.5 F (36.9 C)] 97.9 F (36.6 C) (07/21 1258) Pulse Rate:  [91-121] 96 (07/21 1258) Resp:  [18-28] 24 (07/21 1258) BP: (87-132)/(47-80) 129/67 (07/21 1258) SpO2:  [75 %-98 %] 91 % (07/21 1258)  Intake/Output from previous day: 07/20 0701 - 07/21 0700 In: 1752 [P.O.:480; I.V.:1234.2; IV Piggyback:37.8] Out: 1300 [Urine:1300] Intake/Output this shift: Total I/O In: 241 [I.V.:241] Out: -   Physical Exam Vitals signs reviewed.  Constitutional:      General: She is awake. She is not in acute distress.    Appearance: She is well-developed. She is not toxic-appearing or diaphoretic.  HENT:     Head: Normocephalic and atraumatic.  Pulmonary:     Effort: Pulmonary effort is normal. No respiratory distress.  Abdominal:     Comments: Deferred, patient eating  Neurological:     Mental Status: She is disoriented.  Psychiatric:        Speech: Speech normal.        Behavior: Behavior is cooperative.     Lab Results:  Recent Labs    04/21/19 0648 04/22/19 0700  WBC 18.4* 13.5*  HGB 12.8 11.9*  HCT 39.3 37.0  PLT 139* 110*   BMET Recent Labs    04/21/19 1447 04/22/19 0700  NA 143 138  K 3.7 3.5  CL 99  95*  CO2 31 32  GLUCOSE 297* 350*  BUN 26* 19  CREATININE 1.51* 1.20*  CALCIUM 8.0* 7.5*   PT/INR Recent Labs    04/19/19 1854  LABPROT 14.8  INR 1.2   ABG Recent Labs    04/19/19 1846 04/20/19 0037  PHART  --  7.37  HCO3 4.8* 12.7*    Assessment & Plan: Difficult to assess patient symptoms and history given her AMS. No changes in recommendations today. Will continue to follow.   Debroah Loop, PA-C 04/22/2019

## 2019-04-22 NOTE — Procedures (Signed)
History: 68 year old female with altered mental status and DKA  Sedation: None  Technique: This is a 21 channel routine scalp EEG performed at the bedside with bipolar and monopolar montages arranged in accordance to the international 10/20 system of electrode placement. One channel was dedicated to EKG recording.    Background: There is a posterior dominant rhythm of 9 to 10 Hz which is reactive to eye opening/closure.  In addition, there is generalized irregular delta and theta range activity admixed into the background.  No epileptiform activity was seen.  Photic stimulation: Physiologic driving is not performed  EEG Abnormalities: 1) generalized irregular slow activity  Clinical Interpretation: This EEG is consistent with a mild generalized nonspecific cerebral dysfunction (encephalopathy). There was no seizure or seizure predisposition recorded on this study. Please note that lack of epileptiform activity on EEG does not preclude the possibility of epilepsy.   Roland Rack, MD [facility] [phone]  If 7pm- 7am, please page neurology on call as listed in [place].

## 2019-04-22 NOTE — Plan of Care (Signed)

## 2019-04-22 NOTE — Progress Notes (Signed)
Neurology Follow-up Progress Note [place] at [facility]  Date of Admission: 04/19/2019   ASSESSMENT:  Kristy [name] is a 68 y.o. year old female with history significant for poorly controlled DM, HFrEF, HLD, HTN who presented on 04/19/2019 with AMS. She was found to be in DKA upon arrival to ED. Patient was placed on an insulin drip. Additionally, she was found to have an AKI and as well as elevated troponin. She is now on a heparin gtt.   Neurological work-up has included: MRI brain w/o demonstrated no acute findings. Routine EEG didn't show any epileptiform discharges.   Patient is more alert this morning but remains disoriented to place (knows she's in [place] but doesn't know she's in the hospital until given options) and time. No focal deficits on exam. I suspect that this is a metabolic encephalopathy. Unclear baseline prior to admission so not certain how far she's off from her baseline.   PLAN: - Continue with medical management for blood glucose - No further neurological work-up. - Please call neurology for any further questions or concerns    SUBJECTIVE:  No acute events overnight.     OBJECTIVE: Vital Signs Temp:  [98.1 F (36.7 C)-98.5 F (36.9 C)] 98.3 F (36.8 C) (07/21 0400) Pulse Rate:  [91-119] 114 (07/21 0600) Resp:  [14-28] 22 (07/21 0600) BP: (87-132)/(47-77) 118/65 (07/21 0828) SpO2:  [75 %-98 %] 93 % (07/21 0600)   Physical Exam:  GENERAL:  No acute distress.   MENTAL STATUS EXAM:    Orientation: Alert and oriented to self and that she's in [place], [place]. Doesn't know she's at the hospital unless given options. Does not know the month or year.   Memory: Cooperative, follows commands  Language: Speech is mildly dysarthric but able to identify simple objects - watch and ring     CRANIAL NERVES:    CN 2 (Optic): Visual fields intact to confrontation  CN 3,4,6 (EOM): Pupils equal and reactive to light. Full extraocular eye movement without  nystagmus.  CN 5 (Trigeminal): Facial sensation is normal, no weakness of masticatory muscles.  CN 7 (Facial): No facial weakness or asymmetry.  CN 8 (Auditory): Auditory acuity grossly normal.  CN 9,10 (Glossophar): The uvula is midline, the palate elevates symmetrically.  CN 11 (spinal access): Normal sternocleidomastoid and trapezius strength.  CN 12 (Hypoglossal): The tongue is midline. No atrophy or fasciculations.Marland Kitchen   MOTOR:  Muscle Strength: Strength - Appears to be full strength in all four extremities. no pronation or drift. Muscle Tone: Tone and muscle bulk are normal in the upper and lower extremities.   SENSATION:   Intact to light touch   GAIT: Deferred  Labs: I've reviewed the labs for today   Diagnostic Results: - MRI Brain w/o: no acute infarct - Routine EEG: This EEG is consistent with a mild generalized nonspecific cerebral dysfunction (encephalopathy). There was no seizure or seizure predisposition recorded on this study. Please note that lack of epileptiform activity on EEG does not preclude the possibility of epilepsy.

## 2019-04-22 NOTE — Progress Notes (Signed)
Inpatient Diabetes Program Recommendations  AACE/ADA: New Consensus Statement on Inpatient Glycemic Control (2015)  Target Ranges:  Prepandial:   less than 140 mg/dL      Peak postprandial:   less than 180 mg/dL (1-2 hours)      Critically ill patients:  140 - 180 mg/dL   Lab Results  Component Value Date   GLUCAP 336 (H) 04/22/2019   HGBA1C >15.5 (H) 04/20/2019    Review of Glycemic Control Results for CHICQUITA, MENDEL (MRN [id number]) as of 04/22/2019 09:00  Ref. Range 04/21/2019 12:11 04/21/2019 17:08 04/21/2019 21:42 04/22/2019 07:38  Glucose-Capillary Latest Ref Range: 70 - 99 mg/dL 222 (H) 289 (H) 292 (H) 336 (H)  Diabetes history: DM 2 Outpatient Diabetes medications:  U500 100 units q AM, 100 units with lunch and 80 units with dinner (last visit with Endocrinology was on 12/16/18) Current orders for Inpatient glycemic control:  Lantus 30 units daily, Novolog moderate tid with meals and HS, Novolog 5 units tid with  me Inpatient Diabetes Program Recommendations:    Blood sugars trending up. May need to restart U500 insulin- Consider adding 1/2 of of U500 insulin, 50 units in AM and 40 units q PM.  If U500 insulin restarted, then D/C Lantus and Novolog.   Thanks,  Adah Perl, RN, BC-ADM Inpatient Diabetes Coordinator Pager [phone]

## 2019-04-22 NOTE — Progress Notes (Signed)
[place] for heparin Indication: chest pain/ACS  Allergies  Allergen Reactions  . Fish Oil Other (See Comments)    Gout  . Glimepiride     REACTION: hypoglycemia  . Guanfacine Hcl     REACTION: unspecified  . Rosiglitazone Other (See Comments)    CHF    Patient Measurements: Height: 5' 5"  (165.1 cm) Weight: 163 lb 14.4 oz (74.3 kg) IBW/kg (Calculated) : 57 Heparin Dosing Weight: 61.8 kg  Vital Signs: Temp: 98.6 F (37 C) (07/21 2000) Temp Source: Oral (07/21 1629) BP: 101/77 (07/21 2000) Pulse Rate: 79 (07/21 2000)  Labs: Recent Labs    04/20/19 0413  04/20/19 0414 04/20/19 0850  04/21/19 0648 04/21/19 1447 04/21/19 1856  04/22/19 0700 04/22/19 1337 04/22/19 2012  HGB  --    < > 14.3  --   --  12.8  --   --   --  11.9*  --   --   HCT  --   --  42.1  --   --  39.3  --   --   --  37.0  --   --   PLT  --   --  205  --   --  139*  --   --   --  110*  --   --   HEPARINUNFRC  --   --   --   --   --   --   --   --    < > 0.53 0.25* 0.37  CREATININE  --   --  1.51* 1.32*   < > 1.71* 1.51*  --   --  1.20*  --   --   TROPONINIHS 5,057*  --   --  4,166*  --   --   --  2,950*  --   --   --   --    < > = values in this interval not displayed.    Estimated Creatinine Clearance: 45.9 mL/min (A) (by C-G formula based on SCr of 1.2 mg/dL (H)).   Medical History: Past Medical History:  Diagnosis Date  . Allergic rhinitis   . Allergy   . Cataract    mild   . Crohn's disease ([place]) 10/13/2013  . Diverticulosis   . GERD (gastroesophageal reflux disease)   . Gout   . HFrEF (heart failure with reduced ejection fraction) ([place])    a. 12/2008 Cath: EF 45% w/ inf HK; b. 07/2009 Echo: EF 50-55%; c. 08/2018 Echo: EF 20-25%.  Marland Kitchen Hyperlipidemia   . Hypertension   . IBS (irritable bowel syndrome)   . Left bundle branch block   . Neuromuscular disorder (HCC)    neuropathy  . NICM (nonischemic cardiomyopathy) ([place])    a. 12/2008 Cath: no  significant dzs, EF 45% w/ inf HK->Med Rx; b. 07/2009 Echo: EF 50-55%; c. 08/2018 Echo: EF 20-25%, ant/antsept HK, mild MR, mildly dil LA, nl RV fx; d. 08/2018 Cath: D1 80, otw nonobs dzs->Med rx.  . Non-obstructive CAD (coronary artery disease)    a. 12/2008 Cath: no significant dzs, EF 45% w/ inf HK->Med Rx; b. 08/2018 Cath: LM nl, LAD min irregs, D1 80, LCX 57md, RCA min irregs->Med Rx.  . Osteoarthritis   . Poorly controlled Diabetes mellitus    a. 07/2018 A1c 13.1.  .Marland KitchenSymptomatic cholelithiasis      Assessment: 68year old female admitted with DKA. Patient complains of chest pain with rise in troponin. Pharmacy consulted for  heparin drip while patient confused and with AKI. Baseline APTT, INR and Hg are WNL. Platelets trending down, will continue to monitor. Per cardiology note Heparin to be continued for 48 to 72 hours.  7/20 Heparin infusion started @ 700 units/hr  7/21 @ 0015 HL 0.76, rate decreased to 600 units/hr 7/21 @ 0700 HL 0.53 - therapeutic  7/21 @ 1337 HL 0.25 - spoke to RN - heparin infusion has been running, no stops recorded.  7/21 @ 2012 HL 0.37  ,   Will draw confirmation level on 7/22 @ 0200.     Goal of Therapy:  Heparin level 0.3-0.7 units/ml Monitor platelets by anticoagulation protocol: Yes   Plan:  Heparin level is subtherapeutic. Will increase heparin infusion to 700 units/hr.  Will recheck heparin level in 6 hours. Marland Kitchen  CBC with morning labs per protocol.   Eleonore Chiquito, PharmD, BCPS 04/22/2019 9:26 PM

## 2019-04-22 NOTE — Progress Notes (Signed)
Spoke with pt's niece, Cornelious Bryant, and provided an update on pt's progress/status.

## 2019-04-22 NOTE — Progress Notes (Signed)
Progress Note  Patient Name: Kristy [name] Date of Encounter: 04/22/2019  Primary Cardiologist: Rockey Situ  Subjective   Remains confused though denies any chest pain, palpitations, shortness of breath.  Alert and oriented to place though does not know why she is currently in the hospital.  Continues to struggle with word finding.  Echo on 04/21/2019 showed an EF of 35 to 40% with severe hypokinesis of the mid apical anterior wall and anteroseptal wall with normal RV systolic function.  Inpatient Medications    Scheduled Meds: . aspirin EC  81 mg Oral Daily  . atorvastatin  80 mg Oral q1800  . carvedilol  3.125 mg Oral BID WC  . Chlorhexidine Gluconate Cloth  6 each Topical Q0600  . famotidine  20 mg Oral Daily  . insulin aspart  0-15 Units Subcutaneous TID WC  . insulin aspart  0-5 Units Subcutaneous QHS  . insulin aspart  5 Units Subcutaneous TID WC  . insulin glargine  30 Units Subcutaneous Daily  . mouth rinse  15 mL Mouth Rinse BID   Continuous Infusions: . heparin 600 Units/hr (04/22/19 0311)   PRN Meds: acetaminophen **OR** acetaminophen, metoprolol tartrate, morphine injection, [DISCONTINUED] ondansetron **OR** ondansetron (ZOFRAN) IV, traZODone   Vital Signs    Vitals:   04/22/19 0300 04/22/19 0400 04/22/19 0500 04/22/19 0600  BP: 111/70 129/62 123/70 (!) 87/49  Pulse: (!) 119 (!) 107 (!) 113 (!) 114  Resp: 20 20 (!) 23 (!) 22  Temp:  98.3 F (36.8 C)    TempSrc:      SpO2: 95% 96% 96% 93%  Weight:      Height:        Intake/Output Summary (Last 24 hours) at 04/22/2019 0828 Last data filed at 04/22/2019 0600 Gross per 24 hour  Intake 1454.47 ml  Output 1150 ml  Net 304.47 ml   Filed Weights   04/19/19 2000 04/19/19 2020  Weight: 69.9 kg 69.8 kg    Telemetry    Sinus tachycardia low 100s to 1-teens bpm- Personally Reviewed  ECG    n/a - Personally Reviewed  Physical Exam   GEN: No acute distress.  Remains significantly confused with  difficulty finding words and some loss of coordination, particularly involving the left upper extremity. Neck: No JVD. Cardiac:  Mildly tachycardic, no murmurs, rubs, or gallops.  Respiratory:  Mildly diminished breath sounds bilaterally.  GI: Soft, nontender, non-distended.   MS: No edema; No deformity. Neuro:  Alert and oriented to place; Nonfocal.  Psych: Unable to assess secondary to mental acuity.  Labs    Chemistry Recent Labs  Lab 04/19/19 1854  04/21/19 0648 04/21/19 1447 04/22/19 0700  NA 130*   < > 145 143 138  K 5.0   < > 2.8* 3.7 3.5  CL 86*   < > 97* 99 95*  CO2 12*   < > 37* 31 32  GLUCOSE 1,161*   < > 208* 297* 350*  BUN 34*   < > 29* 26* 19  CREATININE 2.26*   < > 1.71* 1.51* 1.20*  CALCIUM 9.7   < > 7.8* 8.0* 7.5*  PROT 7.7  --   --   --  5.2*  ALBUMIN 3.9  --   --   --  2.6*  AST 45*  --   --   --  45*  ALT 26  --   --   --  21  ALKPHOS 146*  --   --   --  74  BILITOT 1.9*  --   --   --  1.3*  GFRNONAA 22*   < > 30* 35* 47*  GFRAA 25*   < > 35* 41* 54*  ANIONGAP 32*   < > 11 13 11    < > = values in this interval not displayed.     Hematology Recent Labs  Lab 04/20/19 0414 04/21/19 0648 04/22/19 0700  WBC 28.4* 18.4* 13.5*  RBC 4.73 4.27 3.93  HGB 14.3 12.8 11.9*  HCT 42.1 39.3 37.0  MCV 89.0 92.0 94.1  MCH 30.2 30.0 30.3  MCHC 34.0 32.6 32.2  RDW 13.3 14.0 14.1  PLT 205 139* 110*    Cardiac EnzymesNo results for input(s): TROPONINI in the last 168 hours. No results for input(s): TROPIPOC in the last 168 hours.   BNPNo results for input(s): BNP, PROBNP in the last 168 hours.   DDimer No results for input(s): DDIMER in the last 168 hours.   Radiology    Mr Brain Wo Contrast  Result Date: 04/20/2019 IMPRESSION: No acute or significant finding. Mild chronic small-vessel ischemic change of the hemispheric white matter, often seen at this age. Electronically Signed   By: Nelson Chimes M.D.   On: 04/20/2019 14:32   Dg Chest Port 1 View   Result Date: 04/21/2019 IMPRESSION: No acute abnormality noted. Electronically Signed   By: Inez Catalina M.D.   On: 04/21/2019 21:53    Cardiac Studies   2D echo 04/21/2019: 1. Severe hypokinesis of the left ventricular, mid-apical anterior wall and anteroseptal wall.  2. The left ventricle has moderately reduced systolic function, with an ejection fraction of 35-40%. The cavity size was normal. Left ventricular diastolic function could not be evaluated.  3. The right ventricle has normal systolic function. The cavity was normal. There is mildly increased right ventricular wall thickness.  4. Left atrial size was not well visualized.  5. The aortic valve is tricuspid. Mild thickening of the aortic valve. Aortic valve regurgitation was not assessed by color flow Doppler. Mild aortic annular calcification noted.  6. The mitral valve was not well visualized. There is mild mitral annular calcification present.  7. The aortic root is normal in size and structure.  8. The interatrial septum was not well visualized. __________  2D echo 12/10/2018: 1. The left ventricle has low normal systolic function, with an ejection fraction of 50-55%. The cavity size was normal. There is moderate concentric left ventricular hypertrophy. Left ventricular diastolic Doppler parameters are consistent with impaired relaxation. 2. The right ventricle has normal systolic function. The cavity was normal. There is no increase in right ventricular wall thickness. 3. Left atrial size was mildly dilated. 4. The mitral valve is grossly normal. 5. The tricuspid valve is grossly normal. 6. The aortic valve is grossly normal. 7. Significant improvement in EF since last echo. __________  [facility] 08/03/2018: 1. Significant one-vessel coronary artery disease involving first diagonal with mild to moderate disease in the main vessels.  2. Left ventricular angiography was not performed. EF was severely reduced by echo.  3. Right heart catheterization showed normal filling pressures, normal pulmonary pressure and mildly reduced cardiac output at 3.91 with a cardiac index of 2.23.  Patient Profile     68 y.o. female with history of nonischemic cardiomyopathy with normalization of LVEF, nonobstructive coronary artery disease, hypertension, hyperlipidemia, uncontrolled diabetes mellitus, and Crohn's disease, who was admitted with altered mental status felt to be likely metabolic encephalopathy in the setting of diabetic  ketoacidosis and is being seen today for the evaluation of elevated troponin.  Assessment & Plan    1.  Elevated troponin: -Patient denies any chest pain -High-sensitivity troponin peaked greater than 5600 and is currently down trending -Echo this admission demonstrated moderately reduced LVEF with severe hypokinesis in the LAD territory -Prior cardiac cath in 08/2018 demonstrated mild to moderate disease involving D1 and mid LCx -She remains on heparin drip which was started on 7/20 and should be continued for a total of 48 to 72 hours -Continue ASA -Patient will benefit from repeat cardiac catheterization this admission following improvement of her acute illnesses including recent DKA and AKI with associated encephalopathy.  Timing of this cath to be determined based on her clinical course/improvement  2.  Acute on chronic HFrEF: -Patient has a known history of severely reduced LVEF in the past secondary to nonischemic cardiomyopathy with subsequent normalization of EF by echo in 12/2018 -Echo this admission with newly reduced EF as outlined above with wall motion abnormality along the LAD territory -Plan for ischemic evaluation prior to discharge as outlined above -She continues to appear relatively euvolemic and well compensated -Started on Coreg 3.125 on 7/20 which will be continued -Given relative hypotension and acute CHF we will defer escalation of beta-blocker at this time -Continue to  defer Entresto given hypotension and AKI -As renal function and vitals allow, recommend escalation of evidence-based heart failure therapy throughout her admission as able  3.  Sinus tachycardia: -Longstanding issue with the patient having been on carvedilol and ivabradine prior to admission -Coreg was resumed on 04/21/2019 -Resume ivabradine  4.  DKA: -Resolved -Per IM/PCCM  5.  AKI: -Serum creatinine continues to improve since admission with gentle hydration -Avoid nephrotoxic drugs -Need to avoid overhydration given cardiomyopathy  For questions or updates, please contact [facility] Please consult [email or web address] for contact info under Cardiology/STEMI.    Signed, Christell Faith, PA-C [facility] Pager: [phone] 04/22/2019, 8:28 AM

## 2019-04-22 NOTE — Progress Notes (Signed)
[place] for heparin Indication: chest pain/ACS  Allergies  Allergen Reactions  . Fish Oil Other (See Comments)    Gout  . Glimepiride     REACTION: hypoglycemia  . Guanfacine Hcl     REACTION: unspecified  . Rosiglitazone Other (See Comments)    CHF    Patient Measurements: Height: 5' 5"  (165.1 cm) Weight: 163 lb 14.4 oz (74.3 kg) IBW/kg (Calculated) : 57 Heparin Dosing Weight: 61.8 kg  Vital Signs: Temp: 97.9 F (36.6 C) (07/21 1258) Temp Source: Oral (07/21 1258) BP: 129/67 (07/21 1258) Pulse Rate: 96 (07/21 1258)  Labs: Recent Labs    04/19/19 1854  04/20/19 0413 04/20/19 0414 04/20/19 0850  04/21/19 0648 04/21/19 1447 04/21/19 1856 04/22/19 0015 04/22/19 0700 04/22/19 1337  HGB 16.1*  --   --  14.3  --   --  12.8  --   --   --  11.9*  --   HCT 53.0*  --   --  42.1  --   --  39.3  --   --   --  37.0  --   PLT 266  --   --  205  --   --  139*  --   --   --  110*  --   APTT 31  --   --   --   --   --   --   --   --   --   --   --   LABPROT 14.8  --   --   --   --   --   --   --   --   --   --   --   INR 1.2  --   --   --   --   --   --   --   --   --   --   --   HEPARINUNFRC  --   --   --   --   --   --   --   --   --  0.76* 0.53 0.25*  CREATININE 2.26*   < >  --  1.51* 1.32*   < > 1.71* 1.51*  --   --  1.20*  --   CKTOTAL 190  --   --   --   --   --   --   --   --   --   --   --   TROPONINIHS  --   --  7,209*  --  5,667*  --   --   --  2,950*  --   --   --    < > = values in this interval not displayed.    Estimated Creatinine Clearance: 45.9 mL/min (A) (by C-G formula based on SCr of 1.2 mg/dL (H)).   Medical History: Past Medical History:  Diagnosis Date  . Allergic rhinitis   . Allergy   . Cataract    mild   . Crohn's disease ([place]) 10/13/2013  . Diverticulosis   . GERD (gastroesophageal reflux disease)   . Gout   . HFrEF (heart failure with reduced ejection fraction) ([place])    a. 12/2008 Cath: EF 45% w/  inf HK; b. 07/2009 Echo: EF 50-55%; c. 08/2018 Echo: EF 20-25%.  Marland Kitchen Hyperlipidemia   . Hypertension   . IBS (irritable bowel syndrome)   . Left bundle branch block   . Neuromuscular disorder ([place])  neuropathy  . NICM (nonischemic cardiomyopathy) ([place])    a. 12/2008 Cath: no significant dzs, EF 45% w/ inf HK->Med Rx; b. 07/2009 Echo: EF 50-55%; c. 08/2018 Echo: EF 20-25%, ant/antsept HK, mild MR, mildly dil LA, nl RV fx; d. 08/2018 Cath: D1 80, otw nonobs dzs->Med rx.  . Non-obstructive CAD (coronary artery disease)    a. 12/2008 Cath: no significant dzs, EF 45% w/ inf HK->Med Rx; b. 08/2018 Cath: LM nl, LAD min irregs, D1 80, LCX 41md, RCA min irregs->Med Rx.  . Osteoarthritis   . Poorly controlled Diabetes mellitus    a. 07/2018 A1c 13.1.  .Marland KitchenSymptomatic cholelithiasis      Assessment: 68year old female admitted with DKA. Patient complains of chest pain with rise in troponin. Pharmacy consulted for heparin drip while patient confused and with AKI. Baseline APTT, INR and Hg are WNL. Platelets trending down, will continue to monitor. Per cardiology note Heparin to be continued for 48 to 72 hours.  7/20 Heparin infusion started @ 700 units/hr  7/21 @ 0015 HL 0.76, rate decreased to 600 units/hr 7/21 @ 0700 HL 0.53 - therapeutic  7/21 @ 1337 HL 0.25 - spoke to RN - heparin infusion has been running, no stops recorded.     Goal of Therapy:  Heparin level 0.3-0.7 units/ml Monitor platelets by anticoagulation protocol: Yes   Plan:  Heparin level is subtherapeutic. Will increase heparin infusion to 700 units/hr.  Will recheck heparin level in 6 hours. .Marland Kitchen CBC with morning labs per protocol.   KEleonore Chiquito PharmD, BCPS 04/22/2019 2:05 PM

## 2019-04-22 NOTE — Progress Notes (Signed)
[place] for heparin Indication: chest pain/ACS  Allergies  Allergen Reactions  . Fish Oil Other (See Comments)    Gout  . Glimepiride     REACTION: hypoglycemia  . Guanfacine Hcl     REACTION: unspecified  . Rosiglitazone Other (See Comments)    CHF    Patient Measurements: Height: 5' 0.5" (153.7 cm) Weight: 153 lb 15.9 oz (69.8 kg) IBW/kg (Calculated) : 46.65 Heparin Dosing Weight: 61.8 kg  Vital Signs: Temp: 98.3 F (36.8 C) (07/21 0400) BP: 118/65 (07/21 0828) Pulse Rate: 114 (07/21 0600)  Labs: Recent Labs    04/19/19 1854  04/20/19 0413 04/20/19 0414 04/20/19 0850  04/21/19 0648 04/21/19 1447 04/21/19 1856 04/22/19 0015 04/22/19 0700  HGB 16.1*  --   --  14.3  --   --  12.8  --   --   --  11.9*  HCT 53.0*  --   --  42.1  --   --  39.3  --   --   --  37.0  PLT 266  --   --  205  --   --  139*  --   --   --  110*  APTT 31  --   --   --   --   --   --   --   --   --   --   LABPROT 14.8  --   --   --   --   --   --   --   --   --   --   INR 1.2  --   --   --   --   --   --   --   --   --   --   HEPARINUNFRC  --   --   --   --   --   --   --   --   --  0.76* 0.53  CREATININE 2.26*   < >  --  1.51* 1.32*   < > 1.71* 1.51*  --   --  1.20*  CKTOTAL 190  --   --   --   --   --   --   --   --   --   --   TROPONINIHS  --   --  5,057*  --  5,667*  --   --   --  2,950*  --   --    < > = values in this interval not displayed.    Estimated Creatinine Clearance: 40.2 mL/min (A) (by C-G formula based on SCr of 1.2 mg/dL (H)).   Medical History: Past Medical History:  Diagnosis Date  . Allergic rhinitis   . Allergy   . Cataract    mild   . Crohn's disease ([place]) 10/13/2013  . Diverticulosis   . GERD (gastroesophageal reflux disease)   . Gout   . HFrEF (heart failure with reduced ejection fraction) ([place])    a. 12/2008 Cath: EF 45% w/ inf HK; b. 07/2009 Echo: EF 50-55%; c. 08/2018 Echo: EF 20-25%.  Marland Kitchen Hyperlipidemia   .  Hypertension   . IBS (irritable bowel syndrome)   . Left bundle branch block   . Neuromuscular disorder ([facility])    neuropathy  . NICM (nonischemic cardiomyopathy) ([place])    a. 12/2008 Cath: no significant dzs, EF 45% w/ inf HK->Med Rx; b. 07/2009 Echo: EF 50-55%; c. 08/2018 Echo: EF 20-25%, ant/antsept HK,  mild MR, mildly dil LA, nl RV fx; d. 08/2018 Cath: D1 80, otw nonobs dzs->Med rx.  . Non-obstructive CAD (coronary artery disease)    a. 12/2008 Cath: no significant dzs, EF 45% w/ inf HK->Med Rx; b. 08/2018 Cath: LM nl, LAD min irregs, D1 80, LCX 12md, RCA min irregs->Med Rx.  . Osteoarthritis   . Poorly controlled Diabetes mellitus    a. 07/2018 A1c 13.1.  .Marland KitchenSymptomatic cholelithiasis      Assessment: 68year old female admitted with DKA. Patient complains of chest pain with rise in troponin. Pharmacy consulted for heparin drip while patient confused and with AKI. Baseline APTT, INR and Hg are WNL. Platelets trending down, will continue to monitor. Per cardiology note Heparin to be continued for 48 to 72 hours.  7/20 Heparin infusion started @ 700 units/hr  7/21 @ 0015 HL 0.76, rate decreased to 600 units/hr 7/21 @ 0700 HL 0.53    Goal of Therapy:  Heparin level 0.3-0.7 units/ml Monitor platelets by anticoagulation protocol: Yes   Plan:  7/21 @ 0700 HL: 0.53. Level is therapeutic times one. Will continue heparin infusion at 600 units/hr.  Will recheck heparin in level 6 hours for confirmation.  CBC with morning labs per protocol.   LPaulina Fusi PharmD, BCPS 04/22/2019 9:00 AM

## 2019-04-22 NOTE — Progress Notes (Signed)
Name: Kristy [name] MRN: [id number] DOB: 1950/10/04     CONSULTATION DATE: 04/19/2019  CHIEF COMPLAINT:  Altered mental status   HISTORY OF PRESENT ILLNESS:   68 y.o female with a history of poorly controlled T2DM, HFrEF, CAD, HTN, LBBB, HLD presented to ED for AMS and elevated BG. Following chart review of EMR patient was found on the floor with vomit and soda cans around her with AMS, it is unclear how long she was there. Did not take medications for the last few days. She has a hx of recent falls. ER obtained EKG, CXR, CT head w/o contrast, CT cervical spine w/o contrast, and BG. Elevated liver and renal enzymes noted in ED. BG was 1,161, insulin was administered. Elevated WBC, tachycardic, and lactic acid present in ED, concern for sepsis, patient started on broad spectrum abx in ED.   EVENTS Overnight: Patient still found to have AMS but slowly improving. Trazodone given last night, patient able to sleep restfully. Today patient was sitting up in bed eating breakfast in a pleasant mood and able to follow simple commands. She is alert and oriented to person and place, reporting she is in the hospital but did not know where. She did not know the month or year but did know the president. She denies any acute pain. She denies chest pain, tightness, light headedness, dizziness, or SOB. She endorses palpitations but reports this last occurred a few months ago. She denies any abdominal pain, nausea, or vomiting.   SIGNIFICANT EVENTS: 04/19/19: Presented to ED with AMS and DKA 04/20/19: MRI of brain, no acute changes, Elevated troponin I 04/21/19: IV abx stopped, Echo showing EF 35%, EEG shows generalized irregular slow activity, mild generalized nonspecific dysfunction, Elevated Troponin I but trending down 04/22/19: Plan to transfer with cardiac monitoring to med-surg floor  PAST MEDICAL HISTORY :   has a past medical history of Allergic rhinitis, Allergy, Cataract, Crohn's disease ([place])  (10/13/2013), Diverticulosis, GERD (gastroesophageal reflux disease), Gout, HFrEF (heart failure with reduced ejection fraction) ([place]), Hyperlipidemia, Hypertension, IBS (irritable bowel syndrome), Left bundle branch block, Neuromuscular disorder ([place]), NICM (nonischemic cardiomyopathy) ([place]), Non-obstructive CAD (coronary artery disease), Osteoarthritis, Poorly controlled Diabetes mellitus, and Symptomatic cholelithiasis.  has a past surgical history that includes Dilation and curettage of uterus; Vaginal hysterectomy; Skin surgery; Colonoscopy; Shoulder surgery; and RIGHT/LEFT HEART CATH AND CORONARY ANGIOGRAPHY (N/A, 08/26/2018).   Allergies  Allergen Reactions  . Fish Oil Other (See Comments)    Gout  . Glimepiride     REACTION: hypoglycemia  . Guanfacine Hcl     REACTION: unspecified  . Rosiglitazone Other (See Comments)    CHF    REVIEW OF SYSTEMS:    Gen:  Denies  fever, chills  HEENT: Denies blurred vision, double vision Cardiac:  No dizziness, chest pain or heaviness, chest tightness Resp:   No cough, -shortness of breath,-wheezing Gi: Denies swallowing difficulty, stomach pain, nausea or vomiting Gu:  Denies burning urine Ext:   Denies Joint pain, stiffness or swelling Skin: Denies skin rash Endoc:  Denies polyuria, polydipsia , polyphagia or weight change Psych:   Denies depression, insomnia or hallucinations  Other:  All other systems negative  VITAL SIGNS: Temp:  [98.1 F (36.7 C)-98.5 F (36.9 C)] 98.3 F (36.8 C) (07/21 0400) Pulse Rate:  [91-119] 114 (07/21 0600) Resp:  [14-28] 22 (07/21 0600) BP: (87-132)/(47-77) 118/65 (07/21 0828) SpO2:  [75 %-98 %] 93 % (07/21 0600)   I/O last 3 completed shifts: In: 3625.6 [P.O.:480;  I.V.:2607.7; IV Piggyback:537.9] Out: 1675 [Urine:1675] No intake/output data recorded.   SpO2: 93 % O2 Flow Rate (L/min): 8 L/min   Physical Examination:  GENERAL: alert and in no acute distress  HEAD: Normocephalic, atraumatic.   EYES: Pupils equal, round, reactive to light.  No scleral icterus.  MOUTH: Moist mucosal membrane. NECK: Supple. No JVD.  PULMONARY: decreased breath sounds  CARDIOVASCULAR: S1 and S2. Tachycardic rate and regular rhythm. No murmurs, rubs, or gallops.  VASCULAR: 3+ radial pulse, 2+ Dorsalis pedis pulse GASTROINTESTINAL: Soft, +tender in suprapubic area, -distended. No masses. Normoactive bowel sounds in all four quadrents. No hepatosplenomegaly.  MUSCULOSKELETAL: No swelling, clubbing, or edema. 5+ strength in lower extremities.   NEUROLOGIC: alert and oriented x2  SKIN:intact,warm,dry  I personally reviewed lab work that was obtained in last 24 hrs.  CMP     Component Value Date/Time   NA 138 04/22/2019 0700   NA 135 10/16/2018 1035   K 3.5 04/22/2019 0700   CL 95 (L) 04/22/2019 0700   CO2 32 04/22/2019 0700   GLUCOSE 350 (H) 04/22/2019 0700   BUN 19 04/22/2019 0700   BUN 13 10/16/2018 1035   CREATININE 1.20 (H) 04/22/2019 0700   CREATININE 0.72 07/27/2014 0902   CALCIUM 7.5 (L) 04/22/2019 0700   PROT 5.2 (L) 04/22/2019 0700   ALBUMIN 2.6 (L) 04/22/2019 0700   AST 45 (H) 04/22/2019 0700   ALT 21 04/22/2019 0700   ALKPHOS 74 04/22/2019 0700   BILITOT 1.3 (H) 04/22/2019 0700   GFRNONAA 47 (L) 04/22/2019 0700   GFRNONAA 89 07/27/2014 0902   GFRAA 54 (L) 04/22/2019 0700   GFRAA >89 07/27/2014 0902    CBC Latest Ref Rng & Units 04/22/2019 04/21/2019 04/20/2019  WBC 4.0 - 10.5 K/uL 13.5(H) 18.4(H) 28.4(H)  Hemoglobin 12.0 - 15.0 g/dL 11.9(L) 12.8 14.3  Hematocrit 36.0 - 46.0 % 37.0 39.3 42.1  Platelets 150 - 400 K/uL 110(L) 139(L) 205    MEDICATIONS: I have reviewed all medications and confirmed regimen as documented   CULTURE RESULTS   Recent Results (from the past 240 hour(s))  Blood culture (routine x 2)     Status: None (Preliminary result)   Collection Time: 04/19/19  6:53 PM   Specimen: BLOOD  Result Value Ref Range Status   Specimen Description BLOOD LAC  Final    Special Requests   Final    BOTTLES DRAWN AEROBIC AND ANAEROBIC Blood Culture adequate volume   Culture   Final    NO GROWTH 2 DAYS Performed at [facility], [address], [place], [place] [address]    Report Status PENDING  Incomplete  Blood culture (routine x 2)     Status: None (Preliminary result)   Collection Time: 04/19/19  7:50 PM   Specimen: BLOOD  Result Value Ref Range Status   Specimen Description BLOOD LEFT ANTECUBITAL  Final   Special Requests   Final    BOTTLES DRAWN AEROBIC AND ANAEROBIC Blood Culture adequate volume   Culture   Final    NO GROWTH 2 DAYS Performed at [facility], [address] [place], [place] [address]    Report Status PENDING  Incomplete  SARS Coronavirus 2 (CEPHEID - Performed in [facility] hospital lab), Hosp Order     Status: None   Collection Time: 04/19/19  8:37 PM   Specimen: Nasopharyngeal Swab  Result Value Ref Range Status   SARS Coronavirus 2 NEGATIVE NEGATIVE Final    Comment: (NOTE) If result  is NEGATIVE SARS-CoV-2 target nucleic acids are NOT DETECTED. The SARS-CoV-2 RNA is generally detectable in upper and lower  respiratory specimens during the acute phase of infection. The lowest  concentration of SARS-CoV-2 viral copies this assay can detect is 250  copies / mL. A negative result does not preclude SARS-CoV-2 infection  and should not be used as the sole basis for treatment or other  patient management decisions.  A negative result may occur with  improper specimen collection / handling, submission of specimen other  than nasopharyngeal swab, presence of viral mutation(s) within the  areas targeted by this assay, and inadequate number of viral copies  (<250 copies / mL). A negative result must be combined with clinical  observations, patient history, and epidemiological information. If result is POSITIVE SARS-CoV-2 target nucleic acids are DETECTED. The SARS-CoV-2 RNA is generally detectable in  upper and lower  respiratory specimens dur ing the acute phase of infection.  Positive  results are indicative of active infection with SARS-CoV-2.  Clinical  correlation with patient history and other diagnostic information is  necessary to determine patient infection status.  Positive results do  not rule out bacterial infection or co-infection with other viruses. If result is PRESUMPTIVE POSTIVE SARS-CoV-2 nucleic acids MAY BE PRESENT.   A presumptive positive result was obtained on the submitted specimen  and confirmed on repeat testing.  While 2019 novel coronavirus  (SARS-CoV-2) nucleic acids may be present in the submitted sample  additional confirmatory testing may be necessary for epidemiological  and / or clinical management purposes  to differentiate between  SARS-CoV-2 and other Sarbecovirus currently known to infect humans.  If clinically indicated additional testing with an alternate test  methodology [phone]) is advised. The SARS-CoV-2 RNA is generally  detectable in upper and lower respiratory sp ecimens during the acute  phase of infection. The expected result is Negative. Fact Sheet for Patients:  StrictlyIdeas.no Fact Sheet for Healthcare Providers: BankingDealers.co.za This test is not yet approved or cleared by the [place] FDA and has been authorized for detection and/or diagnosis of SARS-CoV-2 by FDA under an Emergency Use Authorization (EUA).  This EUA will remain in effect (meaning this test can be used) for the duration of the COVID-19 declaration under Section 564(b)(1) of the Act, 21 U.S.C. section 360bbb-3(b)(1), unless the authorization is terminated or revoked sooner. Performed at [facility], [address] [place], [place] [address]   MRSA PCR Screening     Status: None   Collection Time: 04/20/19  1:16 AM   Specimen: Nasopharyngeal  Result Value Ref Range Status   MRSA by PCR  NEGATIVE NEGATIVE Final    Comment:        The GeneXpert MRSA Assay (FDA approved for NASAL specimens only), is one component of a comprehensive MRSA colonization surveillance program. It is not intended to diagnose MRSA infection nor to guide or monitor treatment for MRSA infections. Performed at [facility], [address] [place], [place] [address]   Urine Culture     Status: None   Collection Time: 04/20/19  1:56 AM   Specimen: Urine, Random  Result Value Ref Range Status   Specimen Description   Final    URINE, RANDOM Performed at [facility], [address] [place], [place] [address]    Special Requests   Final    NONE Performed at [facility], [address], [place], [place] [address]    Culture   Final  NO GROWTH Performed at [facility], [place] [address] [place], [place] [address]    Report Status 04/21/2019 FINAL  Final          IMAGING     Dg Chest Port 1 View  Result Date: 04/21/2019 CLINICAL DATA:  Acute respiratory failure EXAM: PORTABLE CHEST 1 VIEW COMPARISON:  None. FINDINGS: Cardiac shadow is mildly enlarged but stable. Lungs are well aerated bilaterally. No focal infiltrate or sizable effusion is seen. No bony abnormality is noted. IMPRESSION: No acute abnormality noted. Electronically Signed   By: Inez Catalina M.D.   On: 04/21/2019 21:53    Indwelling Urinary Catheter continued, requirement due to   Reason to continue Indwelling Urinary Catheter strict Intake/Output monitoring for hemodynamic instability     ASSESSMENT AND PLAN SYNOPSIS  68 y.o white female admitted for severe DKA with metabolic encephalopathy, lactic acidosis, and AKI likely secondary to poorly controlled T2DM in the presence of CHF with worsening cardiac function, EF 35%   ACUTE HYPOXEMIC RESPIRATORY FAILURE ACUTE SYSTOLIC CARDIAC FAILURE- EF 35% ELEVATED TROP I Supplemental oxygen as needed to maintain SPO2 >90% Furosemide x1  dose ordered 7/21 Recheck CXR in a.m. 7/22 Cardiology following and managing cardiac issues Patient is on heparin infusion  ACUTE KIDNEY INJURY, resolving Monitor BMET intermittently Monitor I/Os Correct electrolytes as indicated  ACUTE ENCEPHALOPATHY -CT head, EEG unremarkable.  Likely TME.  Improving clinically.  Monitor status  ID -she had an isolated fever and leukocytosis on admission.  Admission diagnoses included concern for sepsis.  No site of infection has been identified.  Fever and leukocytosis have improved or resolved.  Procalcitonin is minimally elevated.  Doubt ongoing bacterial infection  Abx DC'd 07/20. Monitoring off antibiotics  DKA with longstanding history of poorly controlled DMII (Hgb A1C > 15.5%) She is clearly incapable of properly managing DM as outpt!!! DM coordinator assistance appreciated Cont long acting insulin and SSI per DM coordinator recommendations She probably needs to go to SNF or some sort of facility where DM can be properly managed  Transfer to MedSurg floor 7/21 and PCCM sign  Merton Border, MD PCCM service Mobile [phone] Pager [phone] 04/22/2019 1:20 PM

## 2019-04-23 ENCOUNTER — Inpatient Hospital Stay: Payer: Medicare HMO

## 2019-04-23 LAB — BASIC METABOLIC PANEL
Anion gap: 8 (ref 5–15)
BUN: 15 mg/dL (ref 8–23)
CO2: 30 mmol/L (ref 22–32)
Calcium: 7.3 mg/dL — ABNORMAL LOW (ref 8.9–10.3)
Chloride: 99 mmol/L (ref 98–111)
Creatinine, Ser: 1.13 mg/dL — ABNORMAL HIGH (ref 0.44–1.00)
GFR calc Af Amer: 58 mL/min — ABNORMAL LOW (ref >60)
GFR calc non Af Amer: 50 mL/min — ABNORMAL LOW (ref >60)
Glucose, Bld: 206 mg/dL — ABNORMAL HIGH (ref 70–99)
Potassium: 3 mmol/L — ABNORMAL LOW (ref 3.5–5.1)
Sodium: 137 mmol/L (ref 135–145)

## 2019-04-23 LAB — HEPARIN LEVEL (UNFRACTIONATED)
Heparin Unfractionated: 0.25 IU/mL — ABNORMAL LOW (ref 0.30–0.70)
Heparin Unfractionated: 0.24 IU/mL — ABNORMAL LOW (ref 0.30–0.70)

## 2019-04-23 LAB — CBC
HCT: 35.2 % — ABNORMAL LOW (ref 36.0–46.0)
Hemoglobin: 11.3 g/dL — ABNORMAL LOW (ref 12.0–15.0)
MCH: 29.9 pg (ref 26.0–34.0)
MCHC: 32.1 g/dL (ref 30.0–36.0)
MCV: 93.1 fL (ref 80.0–100.0)
Platelets: 98 10*3/uL — ABNORMAL LOW (ref 150–400)
RBC: 3.78 MIL/uL — ABNORMAL LOW (ref 3.87–5.11)
RDW: 13.9 % (ref 11.5–15.5)
WBC: 11.7 10*3/uL — ABNORMAL HIGH (ref 4.0–10.5)
nRBC: 0.2 % (ref 0.0–0.2)

## 2019-04-23 LAB — GLUCOSE, CAPILLARY
Glucose-Capillary: 201 mg/dL — ABNORMAL HIGH (ref 70–99)
Glucose-Capillary: 284 mg/dL — ABNORMAL HIGH (ref 70–99)
Glucose-Capillary: 196 mg/dL — ABNORMAL HIGH (ref 70–99)
Glucose-Capillary: 138 mg/dL — ABNORMAL HIGH (ref 70–99)
Glucose-Capillary: 151 mg/dL — ABNORMAL HIGH (ref 70–99)

## 2019-04-23 MED ORDER — ASPIRIN 81 MG PO CHEW
81.0000 mg | CHEWABLE_TABLET | ORAL | Status: DC
  Administered 2019-04-24: 81 mg via ORAL

## 2019-04-23 MED ORDER — SODIUM CHLORIDE 0.9% FLUSH
3.0000 mL | Freq: Two times a day (BID) | INTRAVENOUS | Status: DC
  Administered 2019-04-23 – 2019-04-27 (×5): 3 mL via INTRAVENOUS

## 2019-04-23 MED ORDER — SODIUM CHLORIDE 0.9 % IV SOLN: 250.0000 mL | INTRAVENOUS | Status: DC | PRN

## 2019-04-23 MED ORDER — SODIUM CHLORIDE 0.9 % IV SOLN: INTRAVENOUS | Status: DC

## 2019-04-23 MED ORDER — SODIUM CHLORIDE 0.9% FLUSH: 3.0000 mL | INTRAVENOUS | Status: DC | PRN

## 2019-04-23 MED ORDER — POTASSIUM CHLORIDE CRYS ER 20 MEQ PO TBCR
40.0000 meq | EXTENDED_RELEASE_TABLET | Freq: Two times a day (BID) | ORAL | Status: AC
  Administered 2019-04-23: 21:00:00 40 meq via ORAL
  Filled 2019-04-23: qty 2

## 2019-04-23 NOTE — Progress Notes (Signed)
[facility] for heparin Indication: chest pain/ACS  Allergies  Allergen Reactions  . Fish Oil Other (See Comments)    Gout  . Glimepiride     REACTION: hypoglycemia  . Guanfacine Hcl     REACTION: unspecified  . Rosiglitazone Other (See Comments)    CHF    Patient Measurements: Height: 5' 5"  (165.1 cm) Weight: 163 lb 14.4 oz (74.3 kg) IBW/kg (Calculated) : 57 Heparin Dosing Weight: 61.8 kg  Vital Signs: Temp: 98.6 F (37 C) (07/21 2000) Temp Source: Oral (07/21 2000) BP: 101/77 (07/21 2000) Pulse Rate: 79 (07/21 2000)  Labs: Recent Labs    04/20/19 0413  04/20/19 0850  04/21/19 0648 04/21/19 1447 04/21/19 1856  04/22/19 0700 04/22/19 1337 04/22/19 2012 04/23/19 0235  HGB  --    < >  --   --  12.8  --   --   --  11.9*  --   --  11.3*  HCT  --    < >  --   --  39.3  --   --   --  37.0  --   --  35.2*  PLT  --    < >  --   --  139*  --   --   --  110*  --   --  98*  HEPARINUNFRC  --   --   --   --   --   --   --    < > 0.53 0.25* 0.37 0.25*  CREATININE  --    < > 1.32*   < > 1.71* 1.51*  --   --  1.20*  --   --   --   TROPONINIHS 5,057*  --  5,667*  --   --   --  2,950*  --   --   --   --   --    < > = values in this interval not displayed.    Estimated Creatinine Clearance: 45.9 mL/min (A) (by C-G formula based on SCr of 1.2 mg/dL (H)).   Medical History: Past Medical History:  Diagnosis Date  . Allergic rhinitis   . Allergy   . Cataract    mild   . Crohn's disease ([place]) 10/13/2013  . Diverticulosis   . GERD (gastroesophageal reflux disease)   . Gout   . HFrEF (heart failure with reduced ejection fraction) ([place])    a. 12/2008 Cath: EF 45% w/ inf HK; b. 07/2009 Echo: EF 50-55%; c. 08/2018 Echo: EF 20-25%.  Marland Kitchen Hyperlipidemia   . Hypertension   . IBS (irritable bowel syndrome)   . Left bundle branch block   . Neuromuscular disorder (H[facility])    neuropathy  . NICM (nonischemic cardiomyopathy) ([place])    a. 12/2008 Cath: no  significant dzs, EF 45% w/ inf HK->Med Rx; b. 07/2009 Echo: EF 50-55%; c. 08/2018 Echo: EF 20-25%, ant/antsept HK, mild MR, mildly dil LA, nl RV fx; d. 08/2018 Cath: D1 80, otw nonobs dzs->Med rx.  . Non-obstructive CAD (coronary artery disease)    a. 12/2008 Cath: no significant dzs, EF 45% w/ inf HK->Med Rx; b. 08/2018 Cath: LM nl, LAD min irregs, D1 80, LCX 93md, RCA min irregs->Med Rx.  . Osteoarthritis   . Poorly controlled Diabetes mellitus    a. 07/2018 A1c 13.1.  .Marland KitchenSymptomatic cholelithiasis      Assessment: 68year old female admitted with DKA. Patient complains of chest pain with rise in troponin.  Pharmacy consulted for heparin drip while patient confused and with AKI. Baseline APTT, INR and Hg are WNL. Platelets trending down, will continue to monitor. Per cardiology note Heparin to be continued for 48 to 72 hours.  7/20 Heparin infusion started @ 700 units/hr  7/21 @ 0015 HL 0.76, rate decreased to 600 units/hr 7/21 @ 0700 HL 0.53 - therapeutic  7/21 @ 1337 HL 0.25 - spoke to RN - heparin infusion has been running, no stops recorded. Infusion increased to 700 units/hr   7/21 @ 2012 HL 0.37   Will draw confirmation level on 7/22 @ 0200.     Goal of Therapy:  Heparin level 0.3-0.7 units/ml Monitor platelets by anticoagulation protocol: Yes   Plan:  7/22 @ 0235 Heparin level: 0.25. Level  is subtherapeutic. Will  increase heparin infusion to 800 units/hr.  Will recheck heparin level in 6 hours.  Plts trending down: 205>> 139>> 110>> 98 - 4T score is 3. Low probability of HIT. Continue to monitor.  CBC with morning labs per protocol.   Pernell Dupre, PharmD, BCPS Clinical Pharmacist 04/23/2019 3:00 AM

## 2019-04-23 NOTE — Progress Notes (Addendum)
[facility] [place] NAME: Kristy [name]    MR#:  [id number]  DATE OF BIRTH:  Feb 23, 1951  SUBJECTIVE:  CHIEF COMPLAINT:   Chief Complaint  Patient presents with  . Altered Mental Status   Came with altered mental status and noted to be in DKA with elevated troponin and lipase. Remains confused, but improving.  completely alert now.  REVIEW OF SYSTEMS:   Due to AMS , can not give ROS.  ROS  DRUG ALLERGIES:   Allergies  Allergen Reactions  . Fish Oil Other (See Comments)    Gout  . Glimepiride     REACTION: hypoglycemia  . Guanfacine Hcl     REACTION: unspecified  . Rosiglitazone Other (See Comments)    CHF    VITALS:  Blood pressure (!) 122/59, pulse 73, temperature 98.5 F (36.9 C), temperature source Oral, resp. rate 19, height 5' 5"  (1.651 m), weight 74.3 kg, SpO2 91 %.  PHYSICAL EXAMINATION:   GENERAL:  68 y.o.-year-old patient lying in the bed with shallow breaths EYES: Pupils equal, round, reactive to light and accommodation. No scleral icterus.  HEENT: Head atraumatic, normocephalic. Oropharynx and nasopharynx clear.  NECK:  Supple, no jugular venous distention. No thyroid enlargement, no tenderness.  LUNGS: Normal breath sounds bilaterally, no wheezing, rales,rhonchi or crepitation. No use of accessory muscles of respiration.  CARDIOVASCULAR: S1, S2 normal. No murmurs, rubs, or gallops.  ABDOMEN: Soft, nontender, nondistended. Bowel sounds present. No organomegaly or mass.  EXTREMITIES: No pedal edema, cyanosis, or clubbing.  NEUROLOGIC: Patient moving all of her extremities on her own,  following commands. PSYCHIATRIC: The patient is alert but confused.  She does not know where she is and does not know the month and year. SKIN: No rash, lesion, or ulcer.   Physical Exam LABORATORY PANEL:   CBC Recent Labs  Lab 04/23/19 0235  WBC 11.7*  HGB 11.3*  HCT 35.2*  PLT 98*    ------------------------------------------------------------------------------------------------------------------  Chemistries  Recent Labs  Lab 04/21/19 0648  04/22/19 0700 04/23/19 0235  NA 145   < > 138 137  K 2.8*   < > 3.5 3.0*  CL 97*   < > 95* 99  CO2 37*   < > 32 30  GLUCOSE 208*   < > 350* 206*  BUN 29*   < > 19 15  CREATININE 1.71*   < > 1.20* 1.13*  CALCIUM 7.8*   < > 7.5* 7.3*  MG 1.8  --   --   --   AST  --   --  45*  --   ALT  --   --  21  --   ALKPHOS  --   --  74  --   BILITOT  --   --  1.3*  --    < > = values in this interval not displayed.   ------------------------------------------------------------------------------------------------------------------  Cardiac Enzymes No results for input(s): TROPONINI in the last 168 hours. ------------------------------------------------------------------------------------------------------------------  RADIOLOGY:  Dg Chest Port 1 View  Result Date: 04/23/2019 CLINICAL DATA:  Respiratory failure. EXAM: PORTABLE CHEST 1 VIEW COMPARISON:  04/21/2019. FINDINGS: Cardiomegaly with mild pulmonary venous congestion. Mild bilateral interstitial prominence. Mild CHF cannot be excluded. Pneumonitis cannot be excluded. Mild bibasilar atelectasis. No prominent pleural effusion or pneumothorax. IMPRESSION: 1. Cardiomegaly with mild pulmonary venous congestion bilateral interstitial prominence. Mild CHF cannot be excluded. Pneumonitis cannot be excluded. 2.  Mild bibasilar atelectasis. Electronically Signed   By: Marcello Moores  Register   On: 04/23/2019 08:18   Dg Chest Port 1 View  Result Date: 04/21/2019 CLINICAL DATA:  Acute respiratory failure EXAM: PORTABLE CHEST 1 VIEW COMPARISON:  None. FINDINGS: Cardiac shadow is mildly enlarged but stable. Lungs are well aerated bilaterally. No focal infiltrate or sizable effusion is seen. No bony abnormality is noted. IMPRESSION: No acute abnormality noted. Electronically Signed   By: Inez Catalina M.D.   On: 04/21/2019 21:53    ASSESSMENT AND PLAN:   Active Problems:   Acute on chronic HFrEF (heart failure with reduced ejection fraction) (HCC)   DKA (diabetic ketoacidoses) (HCC)   Pneumonia   Sepsis ([place])   Elevated troponin  1.  Diabetic ketoacidosis.  Placed on insulin drip.   Serial BMPs.   Manage per ICU team.   Resolved. Started on Humulin R and sliding scale coverage. 2.  Acute encephalopathy.  Could be secondary to diabetic ketoacidosis or sepsis.  MRI brain and EEG negative. Seen by Neurology. I spoke to patient's daughter on the phone as per call patient was living alone but sometimes they have noticed that she forget the days or she forgets some details when they are talking to her. May have some underlying dementia.  3.  Clinical sepsis.  Unclear cause.  Patient has fever and leukocytosis and tachycardia.  Empiric antibiotics.  Follow-up cultures.  Follow-up urine.  Abx stopped as WBCs stable and no fever. Monitor.  4.  Wide-complex tachycardia.  Patient has had a history of left bundle branch block.   Have High troponin,  Seen by cardiology - suggested to start heparine drip   Cath planned 04/24/19  5.  History of nonischemic cardiomyopathy. Ac on ch systolic CHF    Have renal failure, cardio suggest to start Coreg, wait for enteresto and do cath once renal func improve. 6.  Hypertension- stable. 7.  Hyperlipidemia unspecified 8.  GERD 9.  History of Crohn's disease 10. Thrombocytopenia- Was on heparine drip- stopped, no active bleed- monitor.  All the records are reviewed and case discussed with Care Management/Social Workerr. Management plans discussed with the patient, family and they are in agreement.  CODE STATUS: Full.  TOTAL TIME TAKING CARE OF THIS PATIENT: 35 minutes.   POSSIBLE D/C IN 1-2 DAYS, DEPENDING ON CLINICAL CONDITION.   Vaughan Basta M.D on 04/23/2019   Between 7am to 6pm - Pager - [phone]  After 6pm go to  [email or web address] - password [email or web address] [email or web address]  Office  [phone]  CC: Primary care physician; Owens Loffler, MD  Note: This dictation was prepared with Dragon dictation along with smaller phrase technology. Any transcriptional errors that result from this process are unintentional.

## 2019-04-23 NOTE — Progress Notes (Signed)
Patient update provided to pt's step-daughter, Vicky.

## 2019-04-23 NOTE — Care Management Important Message (Signed)
Important Message  Patient Details  Name: Kristy [name] MRN: [id number] Date of Birth: 1950/11/12   Medicare Important Message Given:  Yes     [name] 04/23/2019, 11:16 AM

## 2019-04-23 NOTE — H&P (View-Only) (Signed)
Progress Note  Patient Name: Kristy [name] Date of Encounter: 04/23/2019  Primary Cardiologist: Ida Rogue, MD   Subjective   Patient generally feels unwell but cannot describe this further.  No chest pain, shortness of breath, or palpitations.  Inpatient Medications    Scheduled Meds: . [START ON 04/24/2019] aspirin  81 mg Oral Pre-Cath  . aspirin EC  81 mg Oral Daily  . atorvastatin  80 mg Oral q1800  . carvedilol  3.125 mg Oral BID WC  . Chlorhexidine Gluconate Cloth  6 each Topical Q0600  . famotidine  20 mg Oral Daily  . insulin aspart  0-15 Units Subcutaneous TID WC  . insulin aspart  0-5 Units Subcutaneous QHS  . insulin regular human CONCENTRATED  40 Units Subcutaneous Q supper  . insulin regular human CONCENTRATED  50 Units Subcutaneous Q breakfast  . ivabradine  5 mg Oral BID WC  . mouth rinse  15 mL Mouth Rinse BID  . sodium chloride flush  3 mL Intravenous Q12H   Continuous Infusions: . sodium chloride    . sodium chloride    . heparin 800 Units/hr (04/23/19 0322)   PRN Meds: sodium chloride, acetaminophen **OR** acetaminophen, metoprolol tartrate, [DISCONTINUED] ondansetron **OR** ondansetron (ZOFRAN) IV, sodium chloride flush, traZODone   Vital Signs    Vitals:   04/22/19 1629 04/22/19 2000 04/23/19 0427 04/23/19 0724  BP: 120/69 101/77 125/67 107/64  Pulse: 79 79 79 83  Resp: 20 17 20 18   Temp: 98.2 F (36.8 C) 98.6 F (37 C) 98 F (36.7 C) 98.3 F (36.8 C)  TempSrc: Oral Oral Oral Oral  SpO2: 97% 100% 95% 94%  Weight:      Height:        Intake/Output Summary (Last 24 hours) at 04/23/2019 1017 Last data filed at 04/23/2019 0437 Gross per 24 hour  Intake 56.43 ml  Output 1250 ml  Net -1193.57 ml   Last 3 Weights 04/22/2019 04/19/2019 04/19/2019  Weight (lbs) 163 lb 14.4 oz 153 lb 15.9 oz 154 lb  Weight (kg) 74.345 kg 69.85 kg 69.854 kg      Telemetry    NSR (HR 70-85 bpm) - Personally Reviewed  ECG    No new tracing   Physical Exam   GEN: No acute distress.   Neck: No JVD Cardiac: RRR, no murmurs, rubs, or gallops.  Respiratory: Mildly diminished breath sounds throughout with faint bibasilar crackles. GI: Soft, nontender, non-distended  MS: No edema; No deformity. Neuro:  Nonfocal  Psych: Normal affect   Labs    High Sensitivity Troponin:   Recent Labs  Lab 04/20/19 0413 04/20/19 0850 04/21/19 1856  TROPONINIHS 5,057* 5,667* 2,950*      Cardiac EnzymesNo results for input(s): TROPONINI in the last 168 hours. No results for input(s): TROPIPOC in the last 168 hours.   Chemistry Recent Labs  Lab 04/19/19 1854  04/21/19 1447 04/22/19 0700 04/23/19 0235  NA 130*   < > 143 138 137  K 5.0   < > 3.7 3.5 3.0*  CL 86*   < > 99 95* 99  CO2 12*   < > 31 32 30  GLUCOSE 1,161*   < > 297* 350* 206*  BUN 34*   < > 26* 19 15  CREATININE 2.26*   < > 1.51* 1.20* 1.13*  CALCIUM 9.7   < > 8.0* 7.5* 7.3*  PROT 7.7  --   --  5.2*  --   ALBUMIN 3.9  --   --  2.6*  --   AST 45*  --   --  45*  --   ALT 26  --   --  21  --   ALKPHOS 146*  --   --  74  --   BILITOT 1.9*  --   --  1.3*  --   GFRNONAA 22*   < > 35* 47* 50*  GFRAA 25*   < > 41* 54* 58*  ANIONGAP 32*   < > 13 11 8    < > = values in this interval not displayed.     Hematology Recent Labs  Lab 04/21/19 0648 04/22/19 0700 04/23/19 0235  WBC 18.4* 13.5* 11.7*  RBC 4.27 3.93 3.78*  HGB 12.8 11.9* 11.3*  HCT 39.3 37.0 35.2*  MCV 92.0 94.1 93.1  MCH 30.0 30.3 29.9  MCHC 32.6 32.2 32.1  RDW 14.0 14.1 13.9  PLT 139* 110* 98*    BNPNo results for input(s): BNP, PROBNP in the last 168 hours.   DDimer No results for input(s): DDIMER in the last 168 hours.   Radiology    Dg Chest Port 1 View  Result Date: 04/23/2019 CLINICAL DATA:  Respiratory failure. EXAM: PORTABLE CHEST 1 VIEW COMPARISON:  04/21/2019. FINDINGS: Cardiomegaly with mild pulmonary venous congestion. Mild bilateral interstitial prominence. Mild CHF cannot be  excluded. Pneumonitis cannot be excluded. Mild bibasilar atelectasis. No prominent pleural effusion or pneumothorax. IMPRESSION: 1. Cardiomegaly with mild pulmonary venous congestion bilateral interstitial prominence. Mild CHF cannot be excluded. Pneumonitis cannot be excluded. 2.  Mild bibasilar atelectasis. Electronically Signed   By: Marcello Moores  Register   On: 04/23/2019 08:18   Dg Chest Port 1 View  Result Date: 04/21/2019 CLINICAL DATA:  Acute respiratory failure EXAM: PORTABLE CHEST 1 VIEW COMPARISON:  None. FINDINGS: Cardiac shadow is mildly enlarged but stable. Lungs are well aerated bilaterally. No focal infiltrate or sizable effusion is seen. No bony abnormality is noted. IMPRESSION: No acute abnormality noted. Electronically Signed   By: Inez Catalina M.D.   On: 04/21/2019 21:53    Cardiac Studies   2D echo 04/21/2019: 1. Severe hypokinesis of the left ventricular, mid-apical anterior wall and anteroseptal wall. 2. The left ventricle has moderately reduced systolic function, with an ejection fraction of 35-40%. The cavity size was normal. Left ventricular diastolic function could not be evaluated. 3. The right ventricle has normal systolic function. The cavity was normal. There is mildly increased right ventricular wall thickness. 4. Left atrial size was not well visualized. 5. The aortic valve is tricuspid. Mild thickening of the aortic valve. Aortic valve regurgitation was not assessed by color flow Doppler. Mild aortic annular calcification noted. 6. The mitral valve was not well visualized. There is mild mitral annular calcification present. 7. The aortic root is normal in size and structure. 8. The interatrial septum was not well visualized. __________  2D echo 12/10/2018: 1. The left ventricle has low normal systolic function, with an ejection fraction of 50-55%. The cavity size was normal. There is moderate concentric left ventricular hypertrophy. Left ventricular diastolic  Doppler parameters are consistent with impaired relaxation. 2. The right ventricle has normal systolic function. The cavity was normal. There is no increase in right ventricular wall thickness. 3. Left atrial size was mildly dilated. 4. The mitral valve is grossly normal. 5. The tricuspid valve is grossly normal. 6. The aortic valve is grossly normal. 7. Significant improvement in EF since last echo. __________  [facility] 08/03/2018: 1. Significant one-vessel coronary artery disease involving  first diagonal with mild to moderate disease in the main vessels.  2. Left ventricular angiography was not performed. EF was severely reduced by echo. 3. Right heart catheterization showed normal filling pressures, normal pulmonary pressure and mildly reduced cardiac output at 3.91 with a cardiac index of 2.23.  Patient Profile     68 y.o. female history of nonischemic cardiomyopathy with normalization of LVEF, nonobstructive coronary artery disease, hypertension, hyperlipidemia, uncontrolled diabetes mellitus,and Crohn's disease,who was admitted with altered mental status felt to be likely metabolic encephalopathy in the setting of diabetic ketoacidosis and is being seen today for the evaluation ofelevated troponin.  Assessment & Plan    Elevated troponin: No further chest pain.  Troponin peaked at 5600.  Echo with new anterior/anteroseptal WMA, with patient reporting some chest pain prior to admission.  LHC in 08/2018 showed moderate to severe D1 disease and mild LCx disease.  Plan for cardiac cath with possible PCI tomorrow.  I have reviewed the risks, indications, and alternatives to cardiac catheterization, possible angioplasty, and stenting with the patient. Risks include but are not limited to bleeding, infection, vascular injury, stroke, myocardial infection, arrhythmia, kidney injury, radiation-related injury in the case of prolonged fluoroscopy use, emergency cardiac surgery, and  death. The patient understands the risks of serious complication is 1-2 in 4503 with diagnostic cardiac cath and 1-2% or less with angioplasty/stenting.  Discontinue heparin gtt, given worsening thrombocytopenia.  Continue ASA 81 mg daily, carvedilol, and atorvastatin.  Acute on chronic systolic heart failure: LVEF moderately reduced (previously worse but had normalized on echo in 12/2018 with medical therapy).  Patient with crackles at lung bases but otherwise appears euvolemic.  Maintain net even fluid balance today; gentle hydration overnight in anticipation of cath tomorrow.  Continue carvedilol 3/125 mg BID.  Defer adding ACEI/ARB/AA in the setting of resolving AKI and cath tomorrow.  Sinus tachycardia: Resolved with addition of ivabradine yesterday.  Continue current doses of ivabradine and carvedilol.  DKA: Resolved.  Ongoing management of poorly controlled DM per IM.  AKI: Creatinine continues to improve (1.2 today) but still a little above baseline.  Gentle IVF hydration overnight in anticipation of cath tomorrow.  Avoid nephrotoxic drugs.  Thrombocytopenia: Platelets continue to trend down, 98 this AM.  Discontinue heparin.  Further evaluation for HIT per IM.  Repeat CBC in AM.    For questions or updates, please contact [place] Please consult [email or web address] for contact info under [facility] Cardiology.     Signed, Nelva Bush, MD  04/23/2019, 10:17 AM

## 2019-04-23 NOTE — Progress Notes (Signed)
PT Cancellation Note  Patient Details Name: Kristy [name] MRN: [id number] DOB: [birth year]   Cancelled Treatment:    Reason Eval/Treat Not Completed: Medical issues which prohibited therapy(Consult received and chart reviewed.  Per notes, patient scheduled for cardiac cath (with anticipated intervention) next date.  Will hold PT evaluation until patient post-procedure next date to optimize cardiac performance prior to initiation of exertional activity.)  Kru Allman H. Owens Shark, PT, DPT, NCS 04/23/19, 11:41 AM [phone]

## 2019-04-23 NOTE — Plan of Care (Signed)

## 2019-04-23 NOTE — Progress Notes (Addendum)
Addendum: Agree with note below. Messaged Dr. Saunders Revel about platelets and we will stop the heparin as its been a little more than 48 hours.   Eleonore Chiquito, PharmD, BCPS   ANTICOAGULATION CONSULT NOTE  Pharmacy Consult for heparin Indication: chest pain/ACS  Allergies  Allergen Reactions  . Fish Oil Other (See Comments)    Gout  . Glimepiride     REACTION: hypoglycemia  . Guanfacine Hcl     REACTION: unspecified  . Rosiglitazone Other (See Comments)    CHF    Patient Measurements: Height: 5' 5"  (165.1 cm) Weight: 163 lb 14.4 oz (74.3 kg) IBW/kg (Calculated) : 57 Heparin Dosing Weight: 61.8 kg  Vital Signs: Temp: 98.3 F (36.8 C) (07/22 0724) Temp Source: Oral (07/22 0724) BP: 107/64 (07/22 0724) Pulse Rate: 83 (07/22 0724)  Labs: Recent Labs    04/21/19 0648 04/21/19 1447 04/21/19 1856  04/22/19 0700  04/22/19 2012 04/23/19 0235 04/23/19 0951  HGB 12.8  --   --   --  11.9*  --   --  11.3*  --   HCT 39.3  --   --   --  37.0  --   --  35.2*  --   PLT 139*  --   --   --  110*  --   --  98*  --   HEPARINUNFRC  --   --   --    < > 0.53   < > 0.37 0.25* 0.24*  CREATININE 1.71* 1.51*  --   --  1.20*  --   --  1.13*  --   TROPONINIHS  --   --  2,950*  --   --   --   --   --   --    < > = values in this interval not displayed.    Estimated Creatinine Clearance: 48.7 mL/min (A) (by C-G formula based on SCr of 1.13 mg/dL (H)).   Medical History: Past Medical History:  Diagnosis Date  . Allergic rhinitis   . Allergy   . Cataract    mild   . Crohn's disease ([place]) 10/13/2013  . Diverticulosis   . GERD (gastroesophageal reflux disease)   . Gout   . HFrEF (heart failure with reduced ejection fraction) ([place])    a. 12/2008 Cath: EF 45% w/ inf HK; b. 07/2009 Echo: EF 50-55%; c. 08/2018 Echo: EF 20-25%.  Marland Kitchen Hyperlipidemia   . Hypertension   . IBS (irritable bowel syndrome)   . Left bundle branch block   . Neuromuscular disorder (HCC)    neuropathy  . NICM (nonischemic  cardiomyopathy) ([place])    a. 12/2008 Cath: no significant dzs, EF 45% w/ inf HK->Med Rx; b. 07/2009 Echo: EF 50-55%; c. 08/2018 Echo: EF 20-25%, ant/antsept HK, mild MR, mildly dil LA, nl RV fx; d. 08/2018 Cath: D1 80, otw nonobs dzs->Med rx.  . Non-obstructive CAD (coronary artery disease)    a. 12/2008 Cath: no significant dzs, EF 45% w/ inf HK->Med Rx; b. 08/2018 Cath: LM nl, LAD min irregs, D1 80, LCX 90md, RCA min irregs->Med Rx.  . Osteoarthritis   . Poorly controlled Diabetes mellitus    a. 07/2018 A1c 13.1.  .Marland KitchenSymptomatic cholelithiasis      Assessment: 68year old female admitted with DKA. Patient complains of chest pain with rise in troponin. Pharmacy consulted for heparin drip while patient confused and with AKI. Baseline APTT, INR and Hg are WNL. Platelets trending down, will continue to monitor. Per  cardiology note Heparin to be continued for 48 to 72 hours.  7/20 Heparin infusion started @ 700 units/hr  7/21 @ 0015 HL 0.76, rate decreased to 600 units/hr 7/21 @ 0700 HL 0.53 - therapeutic  7/21 @ 1337 HL 0.25 - spoke to RN - heparin infusion has been running, no stops recorded. Infusion increased to 700 units/hr   7/21 @ 2012 HL 0.37-will draw confirmation level on 7/22 @ 0200 7/22 @ 0235 HL 0.25- subtherapeutic, rate increased to 800 units/hr 7/22 @ 0951 HL 0.24- subtherapeutic, rate increased to 900 units/hr    Goal of Therapy:  Heparin level 0.3-0.7 units/ml Monitor platelets by anticoagulation protocol: Yes   Plan:  Heparin level subtherapeutic, will increase heparin infusion to 900 units/hr Will recheck heparin level in 6 hours Plts trending down: 205>> 139>> 110>> 98- 4T score is 3; low probability of HIT. Continue to monitor Hgb trending down: 14.3 >> 12.8 >>11.9 >> 11.3. Continue to monitor for s/sx of bleeding. CBC with morning labs per protocol Messaged Dr. Saunders Revel regarding down-trending plt levels  Benita Gutter, PharmD Pharmacy Resident 04/23/2019 10:23  AM

## 2019-04-23 NOTE — Progress Notes (Addendum)
Progress Note  Patient Name: Kristy [name] Date of Encounter: 04/23/2019  Primary Cardiologist: Ida Rogue, MD   Subjective   Patient generally feels unwell but cannot describe this further.  No chest pain, shortness of breath, or palpitations.  Inpatient Medications    Scheduled Meds: . [START ON 04/24/2019] aspirin  81 mg Oral Pre-Cath  . aspirin EC  81 mg Oral Daily  . atorvastatin  80 mg Oral q1800  . carvedilol  3.125 mg Oral BID WC  . Chlorhexidine Gluconate Cloth  6 each Topical Q0600  . famotidine  20 mg Oral Daily  . insulin aspart  0-15 Units Subcutaneous TID WC  . insulin aspart  0-5 Units Subcutaneous QHS  . insulin regular human CONCENTRATED  40 Units Subcutaneous Q supper  . insulin regular human CONCENTRATED  50 Units Subcutaneous Q breakfast  . ivabradine  5 mg Oral BID WC  . mouth rinse  15 mL Mouth Rinse BID  . sodium chloride flush  3 mL Intravenous Q12H   Continuous Infusions: . sodium chloride    . sodium chloride    . heparin 800 Units/hr (04/23/19 0322)   PRN Meds: sodium chloride, acetaminophen **OR** acetaminophen, metoprolol tartrate, [DISCONTINUED] ondansetron **OR** ondansetron (ZOFRAN) IV, sodium chloride flush, traZODone   Vital Signs    Vitals:   04/22/19 1629 04/22/19 2000 04/23/19 0427 04/23/19 0724  BP: 120/69 101/77 125/67 107/64  Pulse: 79 79 79 83  Resp: 20 17 20 18   Temp: 98.2 F (36.8 C) 98.6 F (37 C) 98 F (36.7 C) 98.3 F (36.8 C)  TempSrc: Oral Oral Oral Oral  SpO2: 97% 100% 95% 94%  Weight:      Height:        Intake/Output Summary (Last 24 hours) at 04/23/2019 1017 Last data filed at 04/23/2019 0437 Gross per 24 hour  Intake 56.43 ml  Output 1250 ml  Net -1193.57 ml   Last 3 Weights 04/22/2019 04/19/2019 04/19/2019  Weight (lbs) 163 lb 14.4 oz 153 lb 15.9 oz 154 lb  Weight (kg) 74.345 kg 69.85 kg 69.854 kg      Telemetry    NSR (HR 70-85 bpm) - Personally Reviewed  ECG    No new tracing   Physical Exam   GEN: No acute distress.   Neck: No JVD Cardiac: RRR, no murmurs, rubs, or gallops.  Respiratory: Mildly diminished breath sounds throughout with faint bibasilar crackles. GI: Soft, nontender, non-distended  MS: No edema; No deformity. Neuro:  Nonfocal  Psych: Normal affect   Labs    High Sensitivity Troponin:   Recent Labs  Lab 04/20/19 0413 04/20/19 0850 04/21/19 1856  TROPONINIHS 5,057* 5,667* 2,950*      Cardiac EnzymesNo results for input(s): TROPONINI in the last 168 hours. No results for input(s): TROPIPOC in the last 168 hours.   Chemistry Recent Labs  Lab 04/19/19 1854  04/21/19 1447 04/22/19 0700 04/23/19 0235  NA 130*   < > 143 138 137  K 5.0   < > 3.7 3.5 3.0*  CL 86*   < > 99 95* 99  CO2 12*   < > 31 32 30  GLUCOSE 1,161*   < > 297* 350* 206*  BUN 34*   < > 26* 19 15  CREATININE 2.26*   < > 1.51* 1.20* 1.13*  CALCIUM 9.7   < > 8.0* 7.5* 7.3*  PROT 7.7  --   --  5.2*  --   ALBUMIN 3.9  --   --  2.6*  --   AST 45*  --   --  45*  --   ALT 26  --   --  21  --   ALKPHOS 146*  --   --  74  --   BILITOT 1.9*  --   --  1.3*  --   GFRNONAA 22*   < > 35* 47* 50*  GFRAA 25*   < > 41* 54* 58*  ANIONGAP 32*   < > 13 11 8    < > = values in this interval not displayed.     Hematology Recent Labs  Lab 04/21/19 0648 04/22/19 0700 04/23/19 0235  WBC 18.4* 13.5* 11.7*  RBC 4.27 3.93 3.78*  HGB 12.8 11.9* 11.3*  HCT 39.3 37.0 35.2*  MCV 92.0 94.1 93.1  MCH 30.0 30.3 29.9  MCHC 32.6 32.2 32.1  RDW 14.0 14.1 13.9  PLT 139* 110* 98*    BNPNo results for input(s): BNP, PROBNP in the last 168 hours.   DDimer No results for input(s): DDIMER in the last 168 hours.   Radiology    Dg Chest Port 1 View  Result Date: 04/23/2019 CLINICAL DATA:  Respiratory failure. EXAM: PORTABLE CHEST 1 VIEW COMPARISON:  04/21/2019. FINDINGS: Cardiomegaly with mild pulmonary venous congestion. Mild bilateral interstitial prominence. Mild CHF cannot be  excluded. Pneumonitis cannot be excluded. Mild bibasilar atelectasis. No prominent pleural effusion or pneumothorax. IMPRESSION: 1. Cardiomegaly with mild pulmonary venous congestion bilateral interstitial prominence. Mild CHF cannot be excluded. Pneumonitis cannot be excluded. 2.  Mild bibasilar atelectasis. Electronically Signed   By: Marcello Moores  Register   On: 04/23/2019 08:18   Dg Chest Port 1 View  Result Date: 04/21/2019 CLINICAL DATA:  Acute respiratory failure EXAM: PORTABLE CHEST 1 VIEW COMPARISON:  None. FINDINGS: Cardiac shadow is mildly enlarged but stable. Lungs are well aerated bilaterally. No focal infiltrate or sizable effusion is seen. No bony abnormality is noted. IMPRESSION: No acute abnormality noted. Electronically Signed   By: Inez Catalina M.D.   On: 04/21/2019 21:53    Cardiac Studies   2D echo 04/21/2019: 1. Severe hypokinesis of the left ventricular, mid-apical anterior wall and anteroseptal wall. 2. The left ventricle has moderately reduced systolic function, with an ejection fraction of 35-40%. The cavity size was normal. Left ventricular diastolic function could not be evaluated. 3. The right ventricle has normal systolic function. The cavity was normal. There is mildly increased right ventricular wall thickness. 4. Left atrial size was not well visualized. 5. The aortic valve is tricuspid. Mild thickening of the aortic valve. Aortic valve regurgitation was not assessed by color flow Doppler. Mild aortic annular calcification noted. 6. The mitral valve was not well visualized. There is mild mitral annular calcification present. 7. The aortic root is normal in size and structure. 8. The interatrial septum was not well visualized. __________  2D echo 12/10/2018: 1. The left ventricle has low normal systolic function, with an ejection fraction of 50-55%. The cavity size was normal. There is moderate concentric left ventricular hypertrophy. Left ventricular diastolic  Doppler parameters are consistent with impaired relaxation. 2. The right ventricle has normal systolic function. The cavity was normal. There is no increase in right ventricular wall thickness. 3. Left atrial size was mildly dilated. 4. The mitral valve is grossly normal. 5. The tricuspid valve is grossly normal. 6. The aortic valve is grossly normal. 7. Significant improvement in EF since last echo. __________  [facility] 08/03/2018: 1. Significant one-vessel coronary artery disease involving  first diagonal with mild to moderate disease in the main vessels.  2. Left ventricular angiography was not performed. EF was severely reduced by echo. 3. Right heart catheterization showed normal filling pressures, normal pulmonary pressure and mildly reduced cardiac output at 3.91 with a cardiac index of 2.23.  Patient Profile     68 y.o. female history of nonischemic cardiomyopathy with normalization of LVEF, nonobstructive coronary artery disease, hypertension, hyperlipidemia, uncontrolled diabetes mellitus,and Crohn's disease,who was admitted with altered mental status felt to be likely metabolic encephalopathy in the setting of diabetic ketoacidosis and is being seen today for the evaluation ofelevated troponin.  Assessment & Plan    Elevated troponin: No further chest pain.  Troponin peaked at 5600.  Echo with new anterior/anteroseptal WMA, with patient reporting some chest pain prior to admission.  LHC in 08/2018 showed moderate to severe D1 disease and mild LCx disease.  Plan for cardiac cath with possible PCI tomorrow.  I have reviewed the risks, indications, and alternatives to cardiac catheterization, possible angioplasty, and stenting with the patient. Risks include but are not limited to bleeding, infection, vascular injury, stroke, myocardial infection, arrhythmia, kidney injury, radiation-related injury in the case of prolonged fluoroscopy use, emergency cardiac surgery, and  death. The patient understands the risks of serious complication is 1-2 in 7014 with diagnostic cardiac cath and 1-2% or less with angioplasty/stenting.  Discontinue heparin gtt, given worsening thrombocytopenia.  Continue ASA 81 mg daily, carvedilol, and atorvastatin.  Acute on chronic systolic heart failure: LVEF moderately reduced (previously worse but had normalized on echo in 12/2018 with medical therapy).  Patient with crackles at lung bases but otherwise appears euvolemic.  Maintain net even fluid balance today; gentle hydration overnight in anticipation of cath tomorrow.  Continue carvedilol 3/125 mg BID.  Defer adding ACEI/ARB/AA in the setting of resolving AKI and cath tomorrow.  Sinus tachycardia: Resolved with addition of ivabradine yesterday.  Continue current doses of ivabradine and carvedilol.  DKA: Resolved.  Ongoing management of poorly controlled DM per IM.  AKI: Creatinine continues to improve (1.2 today) but still a little above baseline.  Gentle IVF hydration overnight in anticipation of cath tomorrow.  Avoid nephrotoxic drugs.  Thrombocytopenia: Platelets continue to trend down, 98 this AM.  Discontinue heparin.  Further evaluation for HIT per IM.  Repeat CBC in AM.    For questions or updates, please contact [place] Please consult [email or web address] for contact info under [facility] Cardiology.     Signed, Nelva Bush, MD  04/23/2019, 10:17 AM

## 2019-04-23 NOTE — Progress Notes (Signed)
Inpatient Diabetes Program Recommendations  AACE/ADA: New Consensus Statement on Inpatient Glycemic Control (2015)  Target Ranges:  Prepandial:   less than 140 mg/dL      Peak postprandial:   less than 180 mg/dL (1-2 hours)      Critically ill patients:  140 - 180 mg/dL   Lab Results  Component Value Date   GLUCAP 284 (H) 04/23/2019   HGBA1C >15.5 (H) 04/20/2019    Review of Glycemic Control Results for Kristy [name], Kristy [name] (MRN [id number]) as of 04/23/2019 12:58  Ref. Range 04/22/2019 16:30 04/22/2019 20:38 04/23/2019 07:28 04/23/2019 11:35  Glucose-Capillary Latest Ref Range: 70 - 99 mg/dL 145 (H) 145 (H) 201 (H) 284 (H)   Diabetes history: DM 2 Outpatient Diabetes medications:  U500 100 units q AM, 100 units with lunch and 80 units with dinner (last visit with Endocrinology was on 12/16/18) Current orders for Inpatient glycemic control:  U500 50 units with breakfast and U500 40 units with supper  Inpatient Diabetes Program Recommendations:    Attempted to speak with patient today regarding DM management.  She was unable to tell me what type of insulin she takes but did say that it comes in a pen.  I discussed her current A1C>15% and she states that she does take her insulin as prescribed.  She states "I don't think it works".  I explained that we have been giving her insulin here and that her blood sugars are improved.  Again she stated that she doesn't think her insulin works.  I advised her to start with new insulin pen when she goes home and questioned whether the  insulin is possibly expired.  Patient verifies that she lives alone.  I asked about monitoring, and she states that she checks her blood sugars but she can't remember what they are?   Concerned that possibly insulin is not being given correctly or consistently.  May need to make sure that family can assist patient with medications at d/c to assure that they are being given correctly.  ? Home health RN may be helpful as well.    Thanks  Adah Perl, RN, BC-ADM Inpatient Diabetes Coordinator Pager [phone] (8a-5p)

## 2019-04-23 NOTE — Progress Notes (Signed)
Pt is alert and oriented x4.  Pt is coherent of thought.  Pt is cognitively capable to sign her informed consent form for the cardiac catheterization with possible PCI.  Also discussed the above with her HCPOA, Katie via telephone.

## 2019-04-24 ENCOUNTER — Encounter
Admission: EM | Disposition: A | Discharge status: Home Health | Payer: Self-pay | Source: Home / Self Care | Attending: Internal Medicine

## 2019-04-24 ENCOUNTER — Encounter: Payer: Self-pay | Admitting: *Deleted

## 2019-04-24 DIAGNOSIS — I2510 Atherosclerotic heart disease of native coronary artery without angina pectoris: Secondary | ICD-10-CM

## 2019-04-24 HISTORY — PX: LEFT HEART CATH AND CORONARY ANGIOGRAPHY: CATH118249

## 2019-04-24 LAB — CBC
HCT: 44.8 % (ref 36.0–46.0)
Hemoglobin: 14 g/dL (ref 12.0–15.0)
MCH: 29.9 pg (ref 26.0–34.0)
MCHC: 31.3 g/dL (ref 30.0–36.0)
MCV: 95.7 fL (ref 80.0–100.0)
Platelets: 134 10*3/uL — ABNORMAL LOW (ref 150–400)
RBC: 4.68 MIL/uL (ref 3.87–5.11)
RDW: 13.6 % (ref 11.5–15.5)
WBC: 13.2 10*3/uL — ABNORMAL HIGH (ref 4.0–10.5)
nRBC: 0 % (ref 0.0–0.2)

## 2019-04-24 LAB — BASIC METABOLIC PANEL
Anion gap: 9 (ref 5–15)
BUN: 15 mg/dL (ref 8–23)
CO2: 28 mmol/L (ref 22–32)
Calcium: 8.6 mg/dL — ABNORMAL LOW (ref 8.9–10.3)
Chloride: 104 mmol/L (ref 98–111)
Creatinine, Ser: 0.94 mg/dL (ref 0.44–1.00)
GFR calc Af Amer: >60 mL/min (ref >60)
GFR calc non Af Amer: >60 mL/min (ref >60)
Glucose, Bld: 129 mg/dL — ABNORMAL HIGH (ref 70–99)
Potassium: 4.4 mmol/L (ref 3.5–5.1)
Sodium: 141 mmol/L (ref 135–145)

## 2019-04-24 LAB — PROTIME-INR
INR: 1 (ref 0.8–1.2)
Prothrombin Time: 13 seconds (ref 11.4–15.2)

## 2019-04-24 LAB — GLUCOSE, CAPILLARY
Glucose-Capillary: 122 mg/dL — ABNORMAL HIGH (ref 70–99)
Glucose-Capillary: 151 mg/dL — ABNORMAL HIGH (ref 70–99)
Glucose-Capillary: 324 mg/dL — ABNORMAL HIGH (ref 70–99)
Glucose-Capillary: 413 mg/dL — ABNORMAL HIGH (ref 70–99)
Glucose-Capillary: 273 mg/dL — ABNORMAL HIGH (ref 70–99)
Glucose-Capillary: 232 mg/dL — ABNORMAL HIGH (ref 70–99)

## 2019-04-24 LAB — APTT: aPTT: 29 seconds (ref 24–36)

## 2019-04-24 SURGERY — LEFT HEART CATH AND CORONARY ANGIOGRAPHY
Anesthesia: Moderate Sedation

## 2019-04-24 MED ORDER — INSULIN ASPART 100 UNIT/ML ~~LOC~~ SOLN
18.0000 [IU] | Freq: Once | SUBCUTANEOUS | Status: AC
  Administered 2019-04-24: 18:00:00 18 [IU] via SUBCUTANEOUS
  Filled 2019-04-24: qty 1

## 2019-04-24 MED ORDER — SODIUM CHLORIDE 0.9% FLUSH: 3.0000 mL | INTRAVENOUS | Status: DC | PRN

## 2019-04-24 MED ORDER — FLUOXETINE HCL 20 MG PO CAPS
20.0000 mg | ORAL_CAPSULE | Freq: Every day | ORAL | Status: DC
  Administered 2019-04-24 – 2019-04-28 (×5): 20 mg via ORAL
  Filled 2019-04-24 (×5): qty 1

## 2019-04-24 MED ORDER — CLOPIDOGREL BISULFATE 75 MG PO TABS
75.0000 mg | ORAL_TABLET | Freq: Every day | ORAL | Status: DC
  Administered 2019-04-25 – 2019-04-28 (×4): 75 mg via ORAL
  Filled 2019-04-24 (×4): qty 1

## 2019-04-24 MED ORDER — HEPARIN (PORCINE) IN NACL 1000-0.9 UT/500ML-% IV SOLN
INTRAVENOUS | Status: AC
  Filled 2019-04-24: qty 1000

## 2019-04-24 MED ORDER — SODIUM CHLORIDE 0.9% FLUSH
3.0000 mL | Freq: Two times a day (BID) | INTRAVENOUS | Status: DC
  Administered 2019-04-24 – 2019-04-28 (×7): 3 mL via INTRAVENOUS

## 2019-04-24 MED ORDER — IOHEXOL 300 MG/ML  SOLN
INTRAMUSCULAR | Status: DC | PRN
  Administered 2019-04-24: 35 mL via INTRA_ARTERIAL

## 2019-04-24 MED ORDER — ASPIRIN 81 MG PO CHEW
CHEWABLE_TABLET | ORAL | Status: AC
  Filled 2019-04-24: qty 1

## 2019-04-24 MED ORDER — FONDAPARINUX SODIUM 2.5 MG/0.5ML ~~LOC~~ SOLN
2.5000 mg | Freq: Every day | SUBCUTANEOUS | Status: DC
  Filled 2019-04-24: qty 0.5

## 2019-04-24 MED ORDER — SODIUM CHLORIDE 0.9 % IV SOLN: 250.0000 mL | INTRAVENOUS | Status: DC | PRN

## 2019-04-24 MED ORDER — FONDAPARINUX SODIUM 2.5 MG/0.5ML ~~LOC~~ SOLN
2.5000 mg | Freq: Every day | SUBCUTANEOUS | Status: DC
  Administered 2019-04-24 – 2019-04-27 (×4): 2.5 mg via SUBCUTANEOUS
  Filled 2019-04-24 (×5): qty 0.5

## 2019-04-24 MED ORDER — SACUBITRIL-VALSARTAN 24-26 MG PO TABS
1.0000 | ORAL_TABLET | Freq: Two times a day (BID) | ORAL | Status: DC
  Administered 2019-04-24 – 2019-04-26 (×5): 1 via ORAL
  Filled 2019-04-24 (×5): qty 1

## 2019-04-24 MED ORDER — CLOPIDOGREL BISULFATE 75 MG PO TABS
300.0000 mg | ORAL_TABLET | Freq: Once | ORAL | Status: AC
  Administered 2019-04-24: 300 mg via ORAL
  Filled 2019-04-24: qty 4

## 2019-04-24 MED ORDER — FENTANYL CITRATE (PF) 100 MCG/2ML IJ SOLN
INTRAMUSCULAR | Status: AC
  Filled 2019-04-24: qty 2

## 2019-04-24 MED ORDER — MIDAZOLAM HCL 2 MG/2ML IJ SOLN
INTRAMUSCULAR | Status: AC
  Filled 2019-04-24: qty 2

## 2019-04-24 MED ORDER — HYDRALAZINE HCL 20 MG/ML IJ SOLN: 10.0000 mg | INTRAMUSCULAR | Status: AC | PRN

## 2019-04-24 MED ORDER — MIDAZOLAM HCL 2 MG/2ML IJ SOLN
INTRAMUSCULAR | Status: DC | PRN
  Administered 2019-04-24: 0.5 mg via INTRAVENOUS

## 2019-04-24 MED ORDER — FENTANYL CITRATE (PF) 100 MCG/2ML IJ SOLN
INTRAMUSCULAR | Status: DC | PRN
  Administered 2019-04-24: 12.5 ug via INTRAVENOUS

## 2019-04-24 SURGICAL SUPPLY — 9 items
CANNULA 5F STIFF (CANNULA) ×2 IMPLANT
CATH INFINITI 5FR JL4 (CATHETERS) ×2 IMPLANT
CATH INFINITI JR4 5F (CATHETERS) ×2 IMPLANT
DEVICE CLOSURE MYNXGRIP 5F (Vascular Products) ×2 IMPLANT
KIT MANI 3VAL PERCEP (MISCELLANEOUS) ×2 IMPLANT
NEEDLE PERC 18GX7CM (NEEDLE) ×2 IMPLANT
PACK CARDIAC CATH (CUSTOM PROCEDURE TRAY) ×2 IMPLANT
SHEATH AVANTI 5FR X 11CM (SHEATH) ×2 IMPLANT
WIRE GUIDERIGHT .035X150 (WIRE) ×2 IMPLANT

## 2019-04-24 NOTE — Progress Notes (Signed)
Patients daughter, Mychael Soots, called nurses station very upset that she had not been updated concerning her mothers care today, requesting to speak to Agricultural consultant. This RN called patients daughter and provided her with brief update. Primary RN to call later to give more detailed update. All questions and concerns addressed. This RN answered all questions to the best of my abilities. No other concerns at this time.

## 2019-04-24 NOTE — Progress Notes (Addendum)
[place] for Fondaparinux Indication: DVT prophylaxis  Allergies  Allergen Reactions  . Fish Oil Other (See Comments)    Gout  . Glimepiride     REACTION: hypoglycemia  . Guanfacine Hcl     REACTION: unspecified  . Rosiglitazone Other (See Comments)    CHF    Patient Measurements: Height: 5' 5"  (165.1 cm) Weight: 163 lb 12.8 oz (74.3 kg) IBW/kg (Calculated) : 57 Heparin Dosing Weight: 61.8 kg  Vital Signs: Temp: 97.9 F (36.6 C) (07/23 0944) Temp Source: Oral (07/23 0730) BP: 168/78 (07/23 0944) Pulse Rate: 91 (07/23 0944)  Labs: Recent Labs    04/21/19 1856  04/22/19 0700  04/22/19 2012 04/23/19 0235 04/23/19 0951 04/24/19 0602  HGB  --    < > 11.9*  --   --  11.3*  --  14.0  HCT  --   --  37.0  --   --  35.2*  --  44.8  PLT  --   --  110*  --   --  98*  --  134*  LABPROT  --   --   --   --   --   --   --  13.0  INR  --   --   --   --   --   --   --  1.0  HEPARINUNFRC  --    < > 0.53   < > 0.37 0.25* 0.24*  --   CREATININE  --   --  1.20*  --   --  1.13*  --  0.94  TROPONINIHS 2,950*  --   --   --   --   --   --   --    < > = values in this interval not displayed.    Estimated Creatinine Clearance: 58.6 mL/min (by C-G formula based on SCr of 0.94 mg/dL).   Medical History: Past Medical History:  Diagnosis Date  . Allergic rhinitis   . Allergy   . Cataract    mild   . Crohn's disease ([place]) 10/13/2013  . Diverticulosis   . GERD (gastroesophageal reflux disease)   . Gout   . HFrEF (heart failure with reduced ejection fraction) ([place])    a. 12/2008 Cath: EF 45% w/ inf HK; b. 07/2009 Echo: EF 50-55%; c. 08/2018 Echo: EF 20-25%.  Marland Kitchen Hyperlipidemia   . Hypertension   . IBS (irritable bowel syndrome)   . Left bundle branch block   . Neuromuscular disorder (HCC)    neuropathy  . NICM (nonischemic cardiomyopathy) ([place])    a. 12/2008 Cath: no significant dzs, EF 45% w/ inf HK->Med Rx; b. 07/2009 Echo: EF 50-55%; c.  08/2018 Echo: EF 20-25%, ant/antsept HK, mild MR, mildly dil LA, nl RV fx; d. 08/2018 Cath: D1 80, otw nonobs dzs->Med rx.  . Non-obstructive CAD (coronary artery disease)    a. 12/2008 Cath: no significant dzs, EF 45% w/ inf HK->Med Rx; b. 08/2018 Cath: LM nl, LAD min irregs, D1 80, LCX 69md, RCA min irregs->Med Rx.  . Osteoarthritis   . Poorly controlled Diabetes mellitus    a. 07/2018 A1c 13.1.  .Marland KitchenSymptomatic cholelithiasis      Assessment: 68year old female admitted with DKA. Patient presented with complaints of CP and had elevated troponin. She was started on heparin drip. However, since that time patient has endorsed thrombocytopenia; heparin was stopped yesterday @1042 . Platelet count has improved. HIT antibody has been ordered and  LFTs are pending. Baseline aPTT pending, INR WNL.   Plts trending down: 205>> 139>> 110>> 98 >> 134 (s/p d/c heparin)- 4T score is 3; low probability of HIT. Continue to monitor Hgb trending down: 14.3 >> 12.8 >>11.9 >> 11.3 >> 14.0. Continue to monitor for s/sx of bleeding.   Patient's weight and CrCl are appropriate to start Fondaparinux. The last dose of heparin has been  > 24 hours.    Plan:  Will order fondaparinux 2.5 mg SubQ QD.  Will follow aPTT.   CBC and CMP with morning labs per protocol  Thank you for allowing pharmacy to be a part of this patient's care.   Rowland Lathe, PharmD Pharmacy Resident 04/24/2019 4:18 PM

## 2019-04-24 NOTE — Progress Notes (Signed)
Progress Note  Patient Name: Kristy [name] Date of Encounter: 04/24/2019  Primary Cardiologist: Ida Rogue, MD   Subjective   Patient feels well with breathing almost back to normal.  No chest pain, palpitations, or lightheadedness.  Inpatient Medications    Scheduled Meds: . aspirin EC  81 mg Oral Daily  . atorvastatin  80 mg Oral q1800  . carvedilol  3.125 mg Oral BID WC  . Chlorhexidine Gluconate Cloth  6 each Topical Q0600  . clopidogrel  300 mg Oral Once  . [START ON 04/25/2019] clopidogrel  75 mg Oral Q breakfast  . famotidine  20 mg Oral Daily  . insulin aspart  0-15 Units Subcutaneous TID WC  . insulin aspart  0-5 Units Subcutaneous QHS  . insulin regular human CONCENTRATED  40 Units Subcutaneous Q supper  . insulin regular human CONCENTRATED  50 Units Subcutaneous Q breakfast  . ivabradine  5 mg Oral BID WC  . mouth rinse  15 mL Mouth Rinse BID  . sacubitril-valsartan  1 tablet Oral BID  . sodium chloride flush  3 mL Intravenous Q12H  . sodium chloride flush  3 mL Intravenous Q12H   Continuous Infusions: . sodium chloride    . sodium chloride     PRN Meds: sodium chloride, sodium chloride, acetaminophen **OR** acetaminophen, hydrALAZINE, [DISCONTINUED] ondansetron **OR** ondansetron (ZOFRAN) IV, sodium chloride flush, sodium chloride flush, traZODone   Vital Signs    Vitals:   04/24/19 0845 04/24/19 0900 04/24/19 0910 04/24/19 0944  BP: (!) 154/74 (!) 154/68  (!) 168/78  Pulse: 91   91  Resp: (!) 24 (!) 26 20 18   Temp:    97.9 F (36.6 C)  TempSrc:      SpO2: 94% 95% 95% 95%  Weight:      Height:        Intake/Output Summary (Last 24 hours) at 04/24/2019 1100 Last data filed at 04/24/2019 6160 Gross per 24 hour  Intake -  Output 2000 ml  Net -2000 ml   Last 3 Weights 04/24/2019 04/22/2019 04/19/2019  Weight (lbs) 163 lb 12.8 oz 163 lb 14.4 oz 153 lb 15.9 oz  Weight (kg) 74.3 kg 74.345 kg 69.85 kg      Telemetry    Sinus rhythm-  Personally Reviewed  ECG    No new tracing.  Physical Exam   GEN: No acute distress.   Neck: No JVD Cardiac: RRR, no murmurs, rubs, or gallops.  Respiratory:  Mildly diminished breath sounds at both lung bases. GI: Soft, nontender, non-distended  MS: No edema; No deformity. Neuro:  Nonfocal  Psych: Normal affect   Labs    High Sensitivity Troponin:   Recent Labs  Lab 04/20/19 0413 04/20/19 0850 04/21/19 1856  TROPONINIHS 5,057* 5,667* 2,950*      Cardiac EnzymesNo results for input(s): TROPONINI in the last 168 hours. No results for input(s): TROPIPOC in the last 168 hours.   Chemistry Recent Labs  Lab 04/19/19 1854  04/22/19 0700 04/23/19 0235 04/24/19 0602  NA 130*   < > 138 137 141  K 5.0   < > 3.5 3.0* 4.4  CL 86*   < > 95* 99 104  CO2 12*   < > 32 30 28  GLUCOSE 1,161*   < > 350* 206* 129*  BUN 34*   < > 19 15 15   CREATININE 2.26*   < > 1.20* 1.13* 0.94  CALCIUM 9.7   < > 7.5* 7.3* 8.6*  PROT  7.7  --  5.2*  --   --   ALBUMIN 3.9  --  2.6*  --   --   AST 45*  --  45*  --   --   ALT 26  --  21  --   --   ALKPHOS 146*  --  74  --   --   BILITOT 1.9*  --  1.3*  --   --   GFRNONAA 22*   < > 47* 50* >60  GFRAA 25*   < > 54* 58* >60  ANIONGAP 32*   < > 11 8 9    < > = values in this interval not displayed.     Hematology Recent Labs  Lab 04/22/19 0700 04/23/19 0235 04/24/19 0602  WBC 13.5* 11.7* 13.2*  RBC 3.93 3.78* 4.68  HGB 11.9* 11.3* 14.0  HCT 37.0 35.2* 44.8  MCV 94.1 93.1 95.7  MCH 30.3 29.9 29.9  MCHC 32.2 32.1 31.3  RDW 14.1 13.9 13.6  PLT 110* 98* 134*    BNPNo results for input(s): BNP, PROBNP in the last 168 hours.   DDimer No results for input(s): DDIMER in the last 168 hours.   Radiology    Dg Chest Port 1 View  Result Date: 04/23/2019 CLINICAL DATA:  Respiratory failure. EXAM: PORTABLE CHEST 1 VIEW COMPARISON:  04/21/2019. FINDINGS: Cardiomegaly with mild pulmonary venous congestion. Mild bilateral interstitial prominence.  Mild CHF cannot be excluded. Pneumonitis cannot be excluded. Mild bibasilar atelectasis. No prominent pleural effusion or pneumothorax. IMPRESSION: 1. Cardiomegaly with mild pulmonary venous congestion bilateral interstitial prominence. Mild CHF cannot be excluded. Pneumonitis cannot be excluded. 2.  Mild bibasilar atelectasis. Electronically Signed   By: Marcello Moores  Register   On: 04/23/2019 08:18    Cardiac Studies   LHC 04/24/2019: 1. Stable appearance of coronary arteries since 08/2018 without new lesion to explain recent decline in LVEF or troponin elevation.  I suspect elevated troponin may be due to supply-demand mismatch and/or stress-induced cardiomyopathy in the setting of severe DKA. 2. Focal D1 stenosis remains ~80%.  There is mild to moderate, non-obstructive disease involving the LAD and LCx. 3. Mildly elevated left ventricular filling pressure. __________  2D echo 04/21/2019: 1. Severe hypokinesis of the left ventricular, mid-apical anterior wall and anteroseptal wall. 2. The left ventricle has moderately reduced systolic function, with an ejection fraction of 35-40%. The cavity size was normal. Left ventricular diastolic function could not be evaluated. 3. The right ventricle has normal systolic function. The cavity was normal. There is mildly increased right ventricular wall thickness. 4. Left atrial size was not well visualized. 5. The aortic valve is tricuspid. Mild thickening of the aortic valve. Aortic valve regurgitation was not assessed by color flow Doppler. Mild aortic annular calcification noted. 6. The mitral valve was not well visualized. There is mild mitral annular calcification present. 7. The aortic root is normal in size and structure. 8. The interatrial septum was not well visualized. __________  2D echo3/07/2019: 1. The left ventricle has low normal systolic function, with an ejection fraction of 50-55%. The cavity size was normal. There is moderate  concentric left ventricular hypertrophy. Left ventricular diastolic Doppler parameters are consistent with impaired relaxation. 2. The right ventricle has normal systolic function. The cavity was normal. There is no increase in right ventricular wall thickness. 3. Left atrial size was mildly dilated. 4. The mitral valve is grossly normal. 5. The tricuspid valve is grossly normal. 6. The aortic valve is grossly normal.  7. Significant improvement in EF since last echo. __________  [facility] 08/03/2018: 1. Significant one-vessel coronary artery disease involving first diagonal with mild to moderate disease in the main vessels.  2. Left ventricular angiography was not performed. EF was severely reduced by echo. 3. Right heart catheterization showed normal filling pressures, normal pulmonary pressure and mildly reduced cardiac output at 3.91 with a cardiac index of 2.23.  Patient Profile     68 y.o. female with history of nonischemic cardiomyopathy with normalization of LVEF, nonobstructive coronary artery disease, hypertension, hyperlipidemia, uncontrolled diabetes mellitus,and Crohn's disease,whowas admitted with altered mental status felt to be likely metabolic encephalopathy in the setting of diabetic ketoacidosis andis being seen today for the evaluation ofelevated troponin.  Assessment & Plan    Elevated troponin: Most likely secondary to supply-demand mismatch and/or stress-induced cardiomyopathy in the setting of severe DKA.  Catheterization today showed stable appearance of coronary arteries with 80% small to moderate-sized diagonal branch lesion as well as mild to moderate disease involving the LAD and LCx.  No intervention was performed.  Continue medical therapy.  Recommend aspirin and clopidogrel for 12 months, if tolerated.  Continue high intensity statin therapy.  Acute on chronic systolic heart failure: Patient appears euvolemic on exam other than diminished breath  sounds at the lung bases.  LVEDP was mildly elevated on cath today.  Maintain net even to slightly negative fluid balance today.  Consider diuresis tomorrow, as renal function tolerates.  Continue carvedilol 3.125 mg twice daily.  Start Entresto 24-26 mg twice daily today.  Continue ivabradine.  Acute respiratory failure with hypoxia: Patient was weaned off supplemental oxygen last night but required some supplemental oxygen during catheterization today.  Wean oxygen, as tolerated.  Consider gentle diuresis beginning tomorrow, as outlined above.  Sinus tachycardia: Heart rate improved with carvedilol and ivabradine.  Continue current doses of ivabradine and carvedilol.  DKA: Resolved.  Ongoing management of uncontrolled diabetes mellitus per internal medicine.  Acute kidney injury: Renal function continues to improve and is back near baseline today.  Maintain net even to slightly negative fluid balance today; consider gentle diuresis beginning tomorrow if creatinine is stable.  Avoid nephrotoxic drugs.  Will need to monitor renal function closely with addition of Entresto today.  Thrombocytopenia: Platelets improved today.  Heparin-induced thrombocytopenia seems less likely; heparin on hold at this time.  Further management per internal medicine.  Patient is at increased risk for DVT due to immobilization and prolonged hospitalization.  Will defer initiation of enoxaparin versus fondaparinux (if concern for HIT persists) to internal medicine.    For questions or updates, please contact [place] Please consult [email or web address] for contact info under [facility] Cardiology.     Signed, Nelva Bush, MD  04/24/2019, 11:00 AM

## 2019-04-24 NOTE — Progress Notes (Signed)
[place] at [place] NAME: Kristy [name]    MR#:  [id number]  DATE OF BIRTH:  1951-03-19  SUBJECTIVE:   Patient states she is feeling fine today.  She denies any chest pain, palpitations, shortness of breath.  She denies any lower extremity edema.  REVIEW OF SYSTEMS:   Review of Systems  Constitutional: Negative for chills and fever.  HENT: Negative for congestion and sore throat.   Eyes: Negative for blurred vision and double vision.  Respiratory: Negative for cough and shortness of breath.   Cardiovascular: Negative for chest pain and palpitations.  Gastrointestinal: Negative for nausea and vomiting.  Genitourinary: Negative for dysuria and urgency.  Musculoskeletal: Negative for back pain and neck pain.  Neurological: Negative for dizziness and headaches.  Psychiatric/Behavioral: Negative for depression. The patient is not nervous/anxious.     DRUG ALLERGIES:   Allergies  Allergen Reactions  . Fish Oil Other (See Comments)    Gout  . Glimepiride     REACTION: hypoglycemia  . Guanfacine Hcl     REACTION: unspecified  . Rosiglitazone Other (See Comments)    CHF    VITALS:  Blood pressure (!) 168/78, pulse 91, temperature 97.9 F (36.6 C), resp. rate 18, height 5' 5"  (1.651 m), weight 74.3 kg, SpO2 95 %.  PHYSICAL EXAMINATION:   GENERAL:  68 y.o.-year-old patient lying in the bed, in NAD EYES: Pupils equal, round, reactive to light and accommodation. No scleral icterus.  HEENT: Head atraumatic, normocephalic. Oropharynx and nasopharynx clear.  NECK:  Supple, no jugular venous distention. No thyroid enlargement, no tenderness.  LUNGS: Normal breath sounds bilaterally, no wheezing, rales,rhonchi or crepitation. No use of accessory muscles of respiration.  CARDIOVASCULAR: RRR, S1, S2 normal. No murmurs, rubs, or gallops.  ABDOMEN: Soft, nontender, nondistended. Bowel sounds present. No organomegaly or mass.  EXTREMITIES: No  pedal edema, cyanosis, or clubbing.  NEUROLOGIC: CN 2-12 grossly intact, no focal deficits. PSYCHIATRIC: The patient is alert and oriented x 2. SKIN: No rash, lesion, or ulcer.   LABORATORY PANEL:   CBC Recent Labs  Lab 04/24/19 0602  WBC 13.2*  HGB 14.0  HCT 44.8  PLT 134*   ------------------------------------------------------------------------------------------------------------------  Chemistries  Recent Labs  Lab 04/21/19 0648  04/22/19 0700  04/24/19 0602  NA 145   < > 138   < > 141  K 2.8*   < > 3.5   < > 4.4  CL 97*   < > 95*   < > 104  CO2 37*   < > 32   < > 28  GLUCOSE 208*   < > 350*   < > 129*  BUN 29*   < > 19   < > 15  CREATININE 1.71*   < > 1.20*   < > 0.94  CALCIUM 7.8*   < > 7.5*   < > 8.6*  MG 1.8  --   --   --   --   AST  --   --  45*  --   --   ALT  --   --  21  --   --   ALKPHOS  --   --  74  --   --   BILITOT  --   --  1.3*  --   --    < > = values in this interval not displayed.   ------------------------------------------------------------------------------------------------------------------  Cardiac Enzymes No results for input(s): TROPONINI in the last 168  hours. ------------------------------------------------------------------------------------------------------------------  RADIOLOGY:  Dg Chest Port 1 View  Result Date: 04/23/2019 CLINICAL DATA:  Respiratory failure. EXAM: PORTABLE CHEST 1 VIEW COMPARISON:  04/21/2019. FINDINGS: Cardiomegaly with mild pulmonary venous congestion. Mild bilateral interstitial prominence. Mild CHF cannot be excluded. Pneumonitis cannot be excluded. Mild bibasilar atelectasis. No prominent pleural effusion or pneumothorax. IMPRESSION: 1. Cardiomegaly with mild pulmonary venous congestion bilateral interstitial prominence. Mild CHF cannot be excluded. Pneumonitis cannot be excluded. 2.  Mild bibasilar atelectasis. Electronically Signed   By: [place]   On: 04/23/2019 08:18    ASSESSMENT AND PLAN:    Active Problems:   Acute on chronic HFrEF (heart failure with reduced ejection fraction) (HCC)   DKA (diabetic ketoacidoses) (HCC)   Pneumonia   Sepsis ([place])   Elevated troponin  Uncontrolled type 2 diabetes- initially with DKA, but this has resolved. -Continue Humulin R and moderate SSI  Acute on chronic systolic congestive heart failure- ECHO with moderately reduced LVEF with severe hypokinesis. -Started on Entresto -Continue ivabradine and Coreg -Cardiology following  Elevated troponin-likely demand ischemia versus stress-induced cardiomyopathy. -s/p cardiac cath today that showed stable appearance of the coronary arteries -Continue aspirin, Plavix, and statin  Thrombocytopenia- new this admission. Concern for HIT vs other process. -Check HIT antibody, peripheral smear, and LFTs -Patient is now off heparin- start fondaparinux for DVT prophylaxis  AKI- resolved.  Creatinine now back to baseline. -Monitor after cath and after starting Entresto  Acute encephalopathy- improving.  Concerned that patient may have some underlying dementia. -MRI brain and EEG negative. -Patient has been seen by neurology  Acute urinary retention -Urinary catheter placed by urology  -Will do a voiding trial prior to discharge  Sepsis- ruled out.  Blood and urine cultures are negative.  Off antibiotics.  Hypertension- stable.  Hyperlipidemia- stable -Continue home statin  All the records are reviewed and case discussed with Care Management/Social Workerr. Management plans discussed with the patient, family and they are in agreement.  CODE STATUS: Full.  TOTAL TIME TAKING CARE OF THIS PATIENT: 40 minutes.   POSSIBLE D/C IN 1-2 DAYS, DEPENDING ON CLINICAL CONDITION.   Berna Spare  M.D on 04/24/2019   Between 7am to 6pm - Pager - [phone]  After 6pm go to [email or web address] - password [email or web address] [place] [facility]  Office  [phone]  CC: Primary care physician; Owens Loffler, MD  Note: This dictation was prepared with Dragon dictation along with smaller phrase technology. Any transcriptional errors that result from this process are unintentional.

## 2019-04-24 NOTE — Progress Notes (Signed)
Upon conversation with Dr. Bernardo Heater, we will sign off on this patient at this time. Please see his final recommendations in the attestation of my 7/21 note.  Debroah Loop, PA-C  04/24/19 9:13 AM

## 2019-04-24 NOTE — Evaluation (Signed)
Physical Therapy Evaluation Patient Details Name: ASUCENA GALER MRN: [id number] DOB: 1950-12-15 Today's Date: 04/24/2019   History of Present Illness  presented to ER status post fall in home environment, unable to self-recover, reporting progressive weakness, AMS and general non-compliance with medications; admitted for management of DKA, metabolic encephalopathy.  Noted with elevation in troponin (peak at 5600), s/p cardiac cath (7/23) with no new lesions noted (elevated troponin likely demand ischemia).  Clinical Impression  Upon evaluation, patient alert and oriented to self only; follows simple commands, but generally confused with poor insight/safety awareness.  Bilat UE/LE strength and ROM grossly symmetrical and WFL for basic transfers and mobility; no focal weakness, sensory or coordination deficit appreciated.  Able to complete bed mobility with sup; sit/stand, basic transfers and gait (20') without assist device, cga/min assist.  Higher level, dynamic balance deficits evident; consistent min assist for safety.  Mobility improved to cga with use of RW; improved fluidity and overall stability, but consistent cuing for walker management and general safety/orientation/recall of task. Vitals stable and WFL (sats >90% on RA, HR 100-110s); denies chest pain, pressure or palpitations.  R groin cath site clean, dry and intact before and after session. Would benefit from skilled PT to address above deficits and promote optimal return to PLOF; recommend transition to STR upon discharge from acute hospitalization.     Follow Up Recommendations SNF    Equipment Recommendations  Rolling walker with 5" wheels    Recommendations for Other Services       Precautions / Restrictions Precautions Precautions: Fall Restrictions Weight Bearing Restrictions: No      Mobility  Bed Mobility Overal bed mobility: Needs Assistance Bed Mobility: Supine to Sit     Supine to sit: Supervision        Transfers Overall transfer level: Needs assistance Equipment used: Rolling walker (2 wheeled) Transfers: Sit to/from Stand Sit to Stand: Min guard;Min assist         General transfer comment: performed with and without assist device, fair LE strength, does require UE support for lift off and overall balance  Ambulation/Gait Ambulation/Gait assistance: Min assist Gait Distance (Feet): 20 Feet Assistive device: None       General Gait Details: staggered stepping pattern, intermittently reaching for walls/furniture during mobility trials due to dynamic balance deficits  Stairs            Wheelchair Mobility    Modified Rankin (Stroke Patients Only)       Balance Overall balance assessment: Needs assistance Sitting-balance support: No upper extremity supported;Feet supported Sitting balance-Leahy Scale: Good     Standing balance support: No upper extremity supported Standing balance-Leahy Scale: Fair                               Pertinent Vitals/Pain Pain Assessment: No/denies pain    Home Living Family/patient expects to be discharged to:: Private residence Living Arrangements: Alone   Type of Home: House Home Access: Level entry     Home Layout: One level Home Equipment: Environmental consultant - 2 wheels;Cane - single point      Prior Function Level of Independence: Independent with assistive device(s)         Comments: Mod indep with SPC vs RW for ADLs, household mobilization; denies recent fall history, no home O2.  Poor historian; will verify accuracy of social history and PLOF with family as available.     Hand Dominance  Extremity/Trunk Assessment   Upper Extremity Assessment Upper Extremity Assessment: Overall WFL for tasks assessed    Lower Extremity Assessment Lower Extremity Assessment: Overall WFL for tasks assessed(grossly 4/5 throughout; R hip site clean and dry before/after treatment session)       Communication    Communication: No difficulties(generally tangential, difficulty maintaining topic of conversation)  Cognition Arousal/Alertness: Awake/alert Behavior During Therapy: WFL for tasks assessed/performed Overall Cognitive Status: No family/caregiver present to determine baseline cognitive functioning                                 General Comments: oriented to self only; believes it to be middle of the night and believes self to have recently had procedure on hip (unaware of cardiac cath)      General Comments      Exercises Other Exercises Other Exercises: 13' with RW, cga-improved stability and balance with use of RW, but does require min cuing/assist for safe management Other Exercises: Toilet transfer, SPT with RW, cga/min assist; attempts to step away from walker during transfer; constant cuing for reorientation to task, overall safety awareness   Assessment/Plan    PT Assessment Patient needs continued PT services  PT Problem List Decreased strength;Decreased range of motion;Decreased activity tolerance;Decreased balance;Decreased mobility;Decreased coordination;Decreased cognition;Decreased knowledge of use of DME;Decreased safety awareness       PT Treatment Interventions DME instruction;Gait training;Stair training;Functional mobility training;Therapeutic activities;Therapeutic exercise;Balance training;Cognitive remediation;Patient/family education    PT Goals (Current goals can be found in the Care Plan section)  Acute Rehab PT Goals Patient Stated Goal: to go to the bathroom PT Goal Formulation: With patient Time For Goal Achievement: 05/08/19 Potential to Achieve Goals: Good    Frequency Min 2X/week   Barriers to discharge Decreased caregiver support      Co-evaluation               AM-PAC PT "6 Clicks" Mobility  Outcome Measure Help needed turning from your back to your side while in a flat bed without using bedrails?: None Help needed moving  from lying on your back to sitting on the side of a flat bed without using bedrails?: None Help needed moving to and from a bed to a chair (including a wheelchair)?: A Little Help needed standing up from a chair using your arms (e.g., wheelchair or bedside chair)?: A Little Help needed to walk in hospital room?: A Little Help needed climbing 3-5 steps with a railing? : A Little 6 Click Score: 20    End of Session Equipment Utilized During Treatment: Gait belt Activity Tolerance: Patient tolerated treatment well Patient left: in chair;with call bell/phone within reach;with chair alarm set Nurse Communication: Mobility status PT Visit Diagnosis: Difficulty in walking, not elsewhere classified (R26.2);Muscle weakness (generalized) (M62.81)    Time: 2482-5003 PT Time Calculation (min) (ACUTE ONLY): 24 min   Charges:   PT Evaluation $PT Eval Moderate Complexity: 1 Mod PT Treatments $Therapeutic Activity: 8-22 mins        Iretta Mangrum H. Owens Shark, PT, DPT, NCS 04/24/19, 4:08 PM [phone]

## 2019-04-24 NOTE — Interval H&P Note (Signed)
History and Physical Interval Note:  04/24/2019 7:17 AM  Kristy [name]  has presented today for cardiac catheterization, with the diagnosis of NSTEMI and cardiomyopathy.  The various methods of treatment have been discussed with the patient and family. After consideration of risks, benefits and other options for treatment, the patient has consented to  Procedure(s): LEFT HEART CATH AND CORONARY ANGIOGRAPHY (N/A) as a surgical intervention.  The patient's history has been reviewed, patient examined, no change in status, stable for surgery.  I have reviewed the patient's chart and labs.  Questions were answered to the patient's satisfaction.    Cath Lab Visit (complete for each Cath Lab visit)  Clinical Evaluation Leading to the Procedure:   ACS: Yes.    Non-ACS:  N/A  Beckett Maden

## 2019-04-25 DIAGNOSIS — E1310 Other specified diabetes mellitus with ketoacidosis without coma: Secondary | ICD-10-CM

## 2019-04-25 DIAGNOSIS — I248 Other forms of acute ischemic heart disease: Secondary | ICD-10-CM

## 2019-04-25 DIAGNOSIS — J9602 Acute respiratory failure with hypercapnia: Secondary | ICD-10-CM

## 2019-04-25 DIAGNOSIS — I25118 Atherosclerotic heart disease of native coronary artery with other forms of angina pectoris: Secondary | ICD-10-CM

## 2019-04-25 LAB — COMPREHENSIVE METABOLIC PANEL
ALT: 19 U/L (ref 0–44)
AST: 29 U/L (ref 15–41)
Albumin: 2.4 g/dL — ABNORMAL LOW (ref 3.5–5.0)
Alkaline Phosphatase: 123 U/L (ref 38–126)
Anion gap: 9 (ref 5–15)
BUN: 11 mg/dL (ref 8–23)
CO2: 28 mmol/L (ref 22–32)
Calcium: 8.6 mg/dL — ABNORMAL LOW (ref 8.9–10.3)
Chloride: 106 mmol/L (ref 98–111)
Creatinine, Ser: 0.91 mg/dL (ref 0.44–1.00)
GFR calc Af Amer: >60 mL/min (ref >60)
GFR calc non Af Amer: >60 mL/min (ref >60)
Glucose, Bld: 70 mg/dL (ref 70–99)
Potassium: 2.9 mmol/L — ABNORMAL LOW (ref 3.5–5.1)
Sodium: 143 mmol/L (ref 135–145)
Total Bilirubin: 0.7 mg/dL (ref 0.3–1.2)
Total Protein: 5.4 g/dL — ABNORMAL LOW (ref 6.5–8.1)

## 2019-04-25 LAB — CBC
HCT: 38.6 % (ref 36.0–46.0)
Hemoglobin: 12.4 g/dL (ref 12.0–15.0)
MCH: 30 pg (ref 26.0–34.0)
MCHC: 32.1 g/dL (ref 30.0–36.0)
MCV: 93.5 fL (ref 80.0–100.0)
Platelets: 131 10*3/uL — ABNORMAL LOW (ref 150–400)
RBC: 4.13 MIL/uL (ref 3.87–5.11)
RDW: 13.5 % (ref 11.5–15.5)
WBC: 11.5 10*3/uL — ABNORMAL HIGH (ref 4.0–10.5)
nRBC: 0 % (ref 0.0–0.2)

## 2019-04-25 LAB — GLUCOSE, CAPILLARY
Glucose-Capillary: 159 mg/dL — ABNORMAL HIGH (ref 70–99)
Glucose-Capillary: 235 mg/dL — ABNORMAL HIGH (ref 70–99)
Glucose-Capillary: 153 mg/dL — ABNORMAL HIGH (ref 70–99)
Glucose-Capillary: 207 mg/dL — ABNORMAL HIGH (ref 70–99)

## 2019-04-25 LAB — PATHOLOGIST SMEAR REVIEW

## 2019-04-25 LAB — HEPARIN INDUCED PLATELET AB (HIT ANTIBODY): Heparin Induced Plt Ab: 0.057 OD (ref 0.000–0.400)

## 2019-04-25 MED ORDER — POTASSIUM CHLORIDE CRYS ER 20 MEQ PO TBCR
40.0000 meq | EXTENDED_RELEASE_TABLET | ORAL | Status: AC
  Administered 2019-04-25 (×2): 40 meq via ORAL
  Filled 2019-04-25 (×2): qty 2

## 2019-04-25 NOTE — Progress Notes (Signed)
PT Cancellation Note  Patient Details Name: Kristy [name] MRN: [id number] DOB: 1951-01-13   Cancelled Treatment:    Reason Eval/Treat Not Completed: Fatigue/lethargy limiting ability to participate   Pt refused session this pm.  Encouraged but pt stated she has been up already today and did not want to get up again or do supine exercises at this time.   Chesley Noon 04/25/2019, 2:06 PM

## 2019-04-25 NOTE — Progress Notes (Signed)
Progress Note  Patient Name: Kristy [name] Date of Encounter: 04/25/2019  Primary Cardiologist: Ida Rogue, MD   Subjective   Slowly feeling better, leg still weak Little bit of confusion as to recent events, Long discussion with her concerning medication compliance at home Reports that she gets her insulin for free from [employer] [phone] " I have lots of money"  Inpatient Medications    Scheduled Meds: . aspirin EC  81 mg Oral Daily  . atorvastatin  80 mg Oral q1800  . carvedilol  3.125 mg Oral BID WC  . clopidogrel  75 mg Oral Q breakfast  . famotidine  20 mg Oral Daily  . FLUoxetine  20 mg Oral Daily  . fondaparinux (ARIXTRA) injection  2.5 mg Subcutaneous Daily  . insulin aspart  0-15 Units Subcutaneous TID WC  . insulin aspart  0-5 Units Subcutaneous QHS  . insulin regular human CONCENTRATED  40 Units Subcutaneous Q supper  . insulin regular human CONCENTRATED  50 Units Subcutaneous Q breakfast  . ivabradine  5 mg Oral BID WC  . mouth rinse  15 mL Mouth Rinse BID  . potassium chloride  40 mEq Oral Q4H  . sacubitril-valsartan  1 tablet Oral BID  . sodium chloride flush  3 mL Intravenous Q12H  . sodium chloride flush  3 mL Intravenous Q12H   Continuous Infusions: . sodium chloride    . sodium chloride     PRN Meds: sodium chloride, sodium chloride, acetaminophen **OR** acetaminophen, [DISCONTINUED] ondansetron **OR** ondansetron (ZOFRAN) IV, sodium chloride flush, sodium chloride flush, traZODone   Vital Signs    Vitals:   04/24/19 1711 04/24/19 2008 04/25/19 0451 04/25/19 0837  BP: 134/83 126/80 120/60 133/67  Pulse: 98 (!) 101 79 92  Resp: 18 18 18 18   Temp:  98.2 F (36.8 C) 98.5 F (36.9 C) 99 F (37.2 C)  TempSrc:  Oral Oral Oral  SpO2: 99% 96% 96% 98%  Weight:   73.2 kg   Height:        Intake/Output Summary (Last 24 hours) at 04/25/2019 1110 Last data filed at 04/25/2019 1000 Gross per 24 hour  Intake 240 ml  Output 1400 ml   Net -1160 ml   Last 3 Weights 04/25/2019 04/24/2019 04/22/2019  Weight (lbs) 161 lb 6.4 oz 163 lb 12.8 oz 163 lb 14.4 oz  Weight (kg) 73.211 kg 74.3 kg 74.345 kg      Telemetry    Sinus rhythm- Personally Reviewed  ECG    No new tracing.  Physical Exam   GEN: No acute distress.  Laying supine in bed Neck: No JVD Cardiac: RRR, no murmurs, rubs, or gallops.  Respiratory:  Clear to auscultation bilaterally GI: Soft, nontender, non-distended  MS: No edema; No deformity. Neuro:  Nonfocal  Psych: Normal affect   Labs    High Sensitivity Troponin:   Recent Labs  Lab 04/20/19 0413 04/20/19 0850 04/21/19 1856  TROPONINIHS 5,057* 5,667* 2,950*      Cardiac EnzymesNo results for input(s): TROPONINI in the last 168 hours. No results for input(s): TROPIPOC in the last 168 hours.   Chemistry Recent Labs  Lab 04/19/19 1854  04/22/19 0700 04/23/19 0235 04/24/19 0602 04/25/19 0543  NA 130*   < > 138 137 141 143  K 5.0   < > 3.5 3.0* 4.4 2.9*  CL 86*   < > 95* 99 104 106  CO2 12*   < > 32 30 28 28   GLUCOSE  1,161*   < > 350* 206* 129* 70  BUN 34*   < > 19 15 15 11   CREATININE 2.26*   < > 1.20* 1.13* 0.94 0.91  CALCIUM 9.7   < > 7.5* 7.3* 8.6* 8.6*  PROT 7.7  --  5.2*  --   --  5.4*  ALBUMIN 3.9  --  2.6*  --   --  2.4*  AST 45*  --  45*  --   --  29  ALT 26  --  21  --   --  19  ALKPHOS 146*  --  74  --   --  123  BILITOT 1.9*  --  1.3*  --   --  0.7  GFRNONAA 22*   < > 47* 50* >60 >60  GFRAA 25*   < > 54* 58* >60 >60  ANIONGAP 32*   < > 11 8 9 9    < > = values in this interval not displayed.     Hematology Recent Labs  Lab 04/23/19 0235 04/24/19 0602 04/25/19 0543  WBC 11.7* 13.2* 11.5*  RBC 3.78* 4.68 4.13  HGB 11.3* 14.0 12.4  HCT 35.2* 44.8 38.6  MCV 93.1 95.7 93.5  MCH 29.9 29.9 30.0  MCHC 32.1 31.3 32.1  RDW 13.9 13.6 13.5  PLT 98* 134* 131*    BNPNo results for input(s): BNP, PROBNP in the last 168 hours.   DDimer No results for input(s): DDIMER  in the last 168 hours.   Radiology    No results found.  Cardiac Studies   LHC 04/24/2019: 1. Stable appearance of coronary arteries since 08/2018 without new lesion to explain recent decline in LVEF or troponin elevation.  I suspect elevated troponin may be due to supply-demand mismatch and/or stress-induced cardiomyopathy in the setting of severe DKA. 2. Focal D1 stenosis remains ~80%.  There is mild to moderate, non-obstructive disease involving the LAD and LCx. 3. Mildly elevated left ventricular filling pressure. __________  2D echo 04/21/2019: 1. Severe hypokinesis of the left ventricular, mid-apical anterior wall and anteroseptal wall. 2. The left ventricle has moderately reduced systolic function, with an ejection fraction of 35-40%. The cavity size was normal. Left ventricular diastolic function could not be evaluated. 3. The right ventricle has normal systolic function. The cavity was normal. There is mildly increased right ventricular wall thickness. 4. Left atrial size was not well visualized. 5. The aortic valve is tricuspid. Mild thickening of the aortic valve. Aortic valve regurgitation was not assessed by color flow Doppler. Mild aortic annular calcification noted. 6. The mitral valve was not well visualized. There is mild mitral annular calcification present. 7. The aortic root is normal in size and structure. 8. The interatrial septum was not well visualized. __________  2D echo3/07/2019: 1. The left ventricle has low normal systolic function, with an ejection fraction of 50-55%. The cavity size was normal. There is moderate concentric left ventricular hypertrophy. Left ventricular diastolic Doppler parameters are consistent with impaired relaxation. 2. The right ventricle has normal systolic function. The cavity was normal. There is no increase in right ventricular wall thickness. 3. Left atrial size was mildly dilated. 4. The mitral valve is grossly normal.  5. The tricuspid valve is grossly normal. 6. The aortic valve is grossly normal. 7. Significant improvement in EF since last echo. __________  [facility] 08/03/2018: 1. Significant one-vessel coronary artery disease involving first diagonal with mild to moderate disease in the main vessels.  2. Left ventricular angiography  was not performed. EF was severely reduced by echo. 3. Right heart catheterization showed normal filling pressures, normal pulmonary pressure and mildly reduced cardiac output at 3.91 with a cardiac index of 2.23.  Patient Profile     68 y.o. female with history of nonischemic cardiomyopathy with normalization of LVEF, nonobstructive coronary artery disease, hypertension, hyperlipidemia, uncontrolled diabetes mellitus,and Crohn's disease,whowas admitted with altered mental status felt to be likely metabolic encephalopathy in the setting of diabetic ketoacidosis andis being seen today for the evaluation ofelevated troponin.  Assessment & Plan    Elevated troponin: Demand ischemia in the setting of nonischemic cardiomyopathy, severe DKA.   -Catheterization yesterday showed stable appearance of coronary arteries .  Medical management -Recommend aspirin and clopidogrel -Continue high intensity statin therapy. Stressed importance of aggressive diabetes control, chronic issue  Acute on chronic systolic heart failure: euvolemic on exam    LVEDP was mildly elevated on cath yesterday carvedilol 3.125 mg twice daily. Restart Entresto 24-26 mg twice daily today. --- Suspect she may not have been taking her Entresto at home as she reports it was causing her to feel lightheaded.  Pressures here are stable 782 systolic Continue ivabradine/corlanor ----Medication compliance at home likely an issue  Sinus tachycardia: Heart rate improved with carvedilol and ivabradine.  DKA: Resolved.  Likely with medication noncompliance at home  uncontrolled diabetes mellitus   Followed at [facility], poor follow-up recently in the setting of COVID  Acute kidney injury: Secondary to DKA Renal function improved Replete potassium  DVT prophylaxis  Known patient of mine, long discussion concerning each of her medications, compliance, cost, worsening debility Also discussed need for follow-up with endocrinology Lives alone, general decline over the past year  Total encounter time more than 35 minutes  Greater than 50% was spent in counseling and coordination of care with the patient     For questions or updates, please contact [place] Please consult [email or web address] for contact info under [facility].     Signed, Ida Rogue, MD  04/25/2019, 11:10 AM

## 2019-04-25 NOTE — Care Management Important Message (Signed)
Important Message  Patient Details  Name: Kristy [name] MRN: [id number] Date of Birth: 16-Oct-1950   Medicare Important Message Given:  Yes     [name] 04/25/2019, 12:40 PM

## 2019-04-25 NOTE — Progress Notes (Signed)
[place] for Fondaparinux Indication: DVT prophylaxis  Allergies  Allergen Reactions  . Fish Oil Other (See Comments)    Gout  . Glimepiride     REACTION: hypoglycemia  . Guanfacine Hcl     REACTION: unspecified  . Rosiglitazone Other (See Comments)    CHF    Patient Measurements: Height: 5' 5"  (165.1 cm) Weight: 161 lb 6.4 oz (73.2 kg) IBW/kg (Calculated) : 57 Heparin Dosing Weight: 61.8 kg  Vital Signs: Temp: 99 F (37.2 C) (07/24 0837) Temp Source: Oral (07/24 0837) BP: 133/67 (07/24 0837) Pulse Rate: 92 (07/24 0837)  Labs: Recent Labs    04/22/19 2012  04/23/19 0235 04/23/19 0951 04/24/19 0602 04/24/19 1619 04/25/19 0543  HGB  --    < > 11.3*  --  14.0  --  12.4  HCT  --   --  35.2*  --  44.8  --  38.6  PLT  --   --  98*  --  134*  --  131*  APTT  --   --   --   --   --  29  --   LABPROT  --   --   --   --  13.0  --   --   INR  --   --   --   --  1.0  --   --   HEPARINUNFRC 0.37  --  0.25* 0.24*  --   --   --   CREATININE  --   --  1.13*  --  0.94  --  0.91   < > = values in this interval not displayed.    Estimated Creatinine Clearance: 60.1 mL/min (by C-G formula based on SCr of 0.91 mg/dL).   Medical History: Past Medical History:  Diagnosis Date  . Allergic rhinitis   . Allergy   . Cataract    mild   . Crohn's disease ([place]) 10/13/2013  . Diverticulosis   . GERD (gastroesophageal reflux disease)   . Gout   . HFrEF (heart failure with reduced ejection fraction) ([place])    a. 12/2008 Cath: EF 45% w/ inf HK; b. 07/2009 Echo: EF 50-55%; c. 08/2018 Echo: EF 20-25%.  Marland Kitchen Hyperlipidemia   . Hypertension   . IBS (irritable bowel syndrome)   . Left bundle branch block   . Neuromuscular disorder (H[facility])    neuropathy  . NICM (nonischemic cardiomyopathy) ([place])    a. 12/2008 Cath: no significant dzs, EF 45% w/ inf HK->Med Rx; b. 07/2009 Echo: EF 50-55%; c. 08/2018 Echo: EF 20-25%, ant/antsept HK, mild MR, mildly dil LA,  nl RV fx; d. 08/2018 Cath: D1 80, otw nonobs dzs->Med rx.  . Non-obstructive CAD (coronary artery disease)    a. 12/2008 Cath: no significant dzs, EF 45% w/ inf HK->Med Rx; b. 08/2018 Cath: LM nl, LAD min irregs, D1 80, LCX 40md, RCA min irregs->Med Rx.  . Osteoarthritis   . Poorly controlled Diabetes mellitus    a. 07/2018 A1c 13.1.  .Marland KitchenSymptomatic cholelithiasis      Assessment: 68year old female admitted with DKA. Patient presented with complaints of CP and had elevated troponin. She was started on heparin drip. However, since that time patient has endorsed thrombocytopenia; heparin was stopped yesterday @1042 . Platelet count has improved. HIT antibody has been ordered and LFTs are WNL. Baseline aPTT 29, INR WNL.   Plts trending down: 205>> 139>> 110>> 98 >> 134 (s/p d/c heparin)- 4T score is  3; low probability of HIT. Continue to monitor Hgb trending down: 14.3 >> 12.8 >>11.9 >> 11.3 >> 14.0. Continue to monitor for s/sx of bleeding.   Patient's weight and CrCl are appropriate to start Fondaparinux. The last dose of heparin has been  > 24 hours.    Plan:  Will order fondaparinux 2.5 mg SubQ QD.    CBC and CMP with morning labs per protocol  Thank you for allowing pharmacy to be a part of this patient's care.   Oswald Hillock, PharmD, BCPS Pharmacy Resident 04/25/2019 9:52 AM

## 2019-04-25 NOTE — Progress Notes (Addendum)
[facility] [place] NAME: Kristy [name]    MR#:  [id number]  DATE OF BIRTH:  [birth year]  SUBJECTIVE:   Patient is feeling well this morning. She is still on oxygen, but states her shortness of breath has improved. No cough or fevers.  REVIEW OF SYSTEMS:   Review of Systems  Constitutional: Negative for chills and fever.  HENT: Negative for congestion and sore throat.   Eyes: Negative for blurred vision and double vision.  Respiratory: Negative for cough and shortness of breath.   Cardiovascular: Negative for chest pain and palpitations.  Gastrointestinal: Negative for nausea and vomiting.  Genitourinary: Negative for dysuria and urgency.  Musculoskeletal: Negative for back pain and neck pain.  Neurological: Negative for dizziness and headaches.  Psychiatric/Behavioral: Negative for depression. The patient is not nervous/anxious.     DRUG ALLERGIES:   Allergies  Allergen Reactions  . Fish Oil Other (See Comments)    Gout  . Glimepiride     REACTION: hypoglycemia  . Guanfacine Hcl     REACTION: unspecified  . Rosiglitazone Other (See Comments)    CHF    VITALS:  Blood pressure 133/67, pulse 92, temperature 99 F (37.2 C), temperature source Oral, resp. rate 18, height 5' 5"  (1.651 m), weight 73.2 kg, SpO2 98 %.  PHYSICAL EXAMINATION:   GENERAL:  68 y.o.-year-old patient lying in the bed, in NAD EYES: Pupils equal, round, reactive to light and accommodation. No scleral icterus.  HEENT: Head atraumatic, normocephalic. Oropharynx and nasopharynx clear.  NECK:  Supple, no jugular venous distention. No thyroid enlargement, no tenderness.  LUNGS: Normal breath sounds bilaterally, no wheezing, rales,rhonchi or crepitation. No use of accessory muscles of respiration. +[place] in place. CARDIOVASCULAR: RRR, S1, S2 normal. No murmurs, rubs, or gallops.  ABDOMEN: Soft, nontender, nondistended. Bowel sounds present. No organomegaly or mass.   EXTREMITIES: No pedal edema, cyanosis, or clubbing.  NEUROLOGIC: CN 2-12 grossly intact, no focal deficits. PSYCHIATRIC: The patient is alert and oriented x 2. SKIN: No rash, lesion, or ulcer.   LABORATORY PANEL:   CBC Recent Labs  Lab 04/25/19 0543  WBC 11.5*  HGB 12.4  HCT 38.6  PLT 131*   ------------------------------------------------------------------------------------------------------------------  Chemistries  Recent Labs  Lab 04/21/19 0648  04/25/19 0543  NA 145   < > 143  K 2.8*   < > 2.9*  CL 97*   < > 106  CO2 37*   < > 28  GLUCOSE 208*   < > 70  BUN 29*   < > 11  CREATININE 1.71*   < > 0.91  CALCIUM 7.8*   < > 8.6*  MG 1.8  --   --   AST  --    < > 29  ALT  --    < > 19  ALKPHOS  --    < > 123  BILITOT  --    < > 0.7   < > = values in this interval not displayed.   ------------------------------------------------------------------------------------------------------------------  Cardiac Enzymes No results for input(s): TROPONINI in the last 168 hours. ------------------------------------------------------------------------------------------------------------------  RADIOLOGY:  No results found.  ASSESSMENT AND PLAN:   Uncontrolled type 2 diabetes- initially with DKA, but this has resolved. -Continue Humulin R and moderate SSI  Acute on chronic systolic congestive heart failure- ECHO with moderately reduced LVEF with severe hypokinesis. -Started on Entresto this admission -Continue ivabradine and Coreg -Cardiology following  Hypokalemia -Replete and recheck  Elevated troponin-likely  demand ischemia versus stress-induced cardiomyopathy. -s/p cardiac cath 7/23 that showed stable appearance of the coronary arteries -Continue aspirin, plavix, and statin  Thrombocytopenia- new this admission. Concern for HIT vs other process. -HIT antibody is pending -Peripheral smear unremarkable -Patient is now off heparin- start fondaparinux for DVT  prophylaxis  Acute encephalopathy- resolved.  Concerned that patient may have some underlying dementia. -MRI brain and EEG negative. -Patient has been seen by neurology  Acute urinary retention -Urinary catheter placed by urology on admission -Will do a voiding trial today  Sepsis- ruled out.  Blood and urine cultures are negative.  Off antibiotics.  Hypertension- stable.  Hyperlipidemia- stable -Continue home statin  Patient is medically stable for discharge. Awaiting SNF placement.  All the records are reviewed and case discussed with Care Management/Social Workerr. Management plans discussed with the patient, family and they are in agreement.  CODE STATUS: Full.  TOTAL TIME TAKING CARE OF THIS PATIENT: 35 minutes.   POSSIBLE D/C IN 1-2 DAYS, DEPENDING ON CLINICAL CONDITION.   Berna Spare Mayo M.D on 04/25/2019   Between 7am to 6pm - Pager - [phone]  After 6pm go to [email or web address] - password [email or web address] [facility]  Office  [phone]  CC: Primary care physician; Owens Loffler, MD  Note: This dictation was prepared with Dragon dictation along with smaller phrase technology. Any transcriptional errors that result from this process are unintentional.

## 2019-04-26 LAB — GLUCOSE, CAPILLARY
Glucose-Capillary: 252 mg/dL — ABNORMAL HIGH (ref 70–99)
Glucose-Capillary: 463 mg/dL — ABNORMAL HIGH (ref 70–99)
Glucose-Capillary: 155 mg/dL — ABNORMAL HIGH (ref 70–99)
Glucose-Capillary: 193 mg/dL — ABNORMAL HIGH (ref 70–99)

## 2019-04-26 LAB — BASIC METABOLIC PANEL
Anion gap: 6 (ref 5–15)
BUN: 12 mg/dL (ref 8–23)
CO2: 26 mmol/L (ref 22–32)
Calcium: 8.9 mg/dL (ref 8.9–10.3)
Chloride: 110 mmol/L (ref 98–111)
Creatinine, Ser: 0.99 mg/dL (ref 0.44–1.00)
GFR calc Af Amer: >60 mL/min (ref >60)
GFR calc non Af Amer: 59 mL/min — ABNORMAL LOW (ref >60)
Glucose, Bld: 186 mg/dL — ABNORMAL HIGH (ref 70–99)
Potassium: 3.2 mmol/L — ABNORMAL LOW (ref 3.5–5.1)
Sodium: 142 mmol/L (ref 135–145)

## 2019-04-26 LAB — CBC
HCT: 38.2 % (ref 36.0–46.0)
Hemoglobin: 12.2 g/dL (ref 12.0–15.0)
MCH: 30 pg (ref 26.0–34.0)
MCHC: 31.9 g/dL (ref 30.0–36.0)
MCV: 94.1 fL (ref 80.0–100.0)
Platelets: 166 10*3/uL (ref 150–400)
RBC: 4.06 MIL/uL (ref 3.87–5.11)
RDW: 13.7 % (ref 11.5–15.5)
WBC: 11.6 10*3/uL — ABNORMAL HIGH (ref 4.0–10.5)
nRBC: 0 % (ref 0.0–0.2)

## 2019-04-26 MED ORDER — SPIRONOLACTONE 25 MG PO TABS
25.0000 mg | ORAL_TABLET | Freq: Every day | ORAL | Status: DC
  Administered 2019-04-26 – 2019-04-28 (×3): 25 mg via ORAL
  Filled 2019-04-26 (×3): qty 1

## 2019-04-26 MED ORDER — SACUBITRIL-VALSARTAN 49-51 MG PO TABS
1.0000 | ORAL_TABLET | Freq: Two times a day (BID) | ORAL | Status: DC
  Administered 2019-04-26 – 2019-04-28 (×4): 1 via ORAL
  Filled 2019-04-26 (×5): qty 1

## 2019-04-26 MED ORDER — POTASSIUM CHLORIDE CRYS ER 20 MEQ PO TBCR
40.0000 meq | EXTENDED_RELEASE_TABLET | ORAL | Status: AC
  Administered 2019-04-26 (×2): 40 meq via ORAL
  Filled 2019-04-26 (×2): qty 2

## 2019-04-26 MED ORDER — INSULIN REGULAR HUMAN (CONC) 500 UNIT/ML ~~LOC~~ SOPN
55.0000 [IU] | PEN_INJECTOR | Freq: Every day | SUBCUTANEOUS | Status: DC
  Administered 2019-04-27 – 2019-04-28 (×2): 55 [IU] via SUBCUTANEOUS
  Filled 2019-04-26: qty 3

## 2019-04-26 NOTE — Progress Notes (Signed)
[facility] at [place] NAME: Kristy [name]    MR#:  [id number]  DATE OF BIRTH:  [birth year]  SUBJECTIVE:    denies any complaints. REVIEW OF SYSTEMS:   Review of Systems  Constitutional: Negative for chills and fever.  HENT: Negative for congestion and sore throat.   Eyes: Negative for blurred vision and double vision.  Respiratory: Negative for cough and shortness of breath.   Cardiovascular: Negative for chest pain and palpitations.  Gastrointestinal: Negative for nausea and vomiting.  Genitourinary: Negative for dysuria and urgency.  Musculoskeletal: Negative for back pain and neck pain.  Neurological: Negative for dizziness and headaches.  Psychiatric/Behavioral: Negative for depression. The patient is not nervous/anxious.     DRUG ALLERGIES:   Allergies  Allergen Reactions  . Fish Oil Other (See Comments)    Gout  . Glimepiride     REACTION: hypoglycemia  . Guanfacine Hcl     REACTION: unspecified  . Rosiglitazone Other (See Comments)    CHF    VITALS:  Blood pressure (!) 166/84, pulse (!) 101, temperature 98.3 F (36.8 C), temperature source Oral, resp. rate (!) 24, height 5' 5"  (1.651 m), weight 71.1 kg, SpO2 98 %.  PHYSICAL EXAMINATION:   GENERAL:  68 y.o.-year-old patient lying in the bed, in NAD EYES: Pupils equal, round, reactive to light and accommodation. No scleral icterus.  HEENT: Head atraumatic, normocephalic. Oropharynx and nasopharynx clear.  NECK:  Supple, no jugular venous distention. No thyroid enlargement, no tenderness.  LUNGS: Normal breath sounds bilaterally, no wheezing, rales,rhonchi or crepitation. No use of accessory muscles of respiration. +[place] in place. CARDIOVASCULAR: RRR, S1, S2 normal. No murmurs, rubs, or gallops.  ABDOMEN: Soft, nontender, nondistended. Bowel sounds present. No organomegaly or mass.  EXTREMITIES: No pedal edema, cyanosis, or clubbing.  NEUROLOGIC: CN 2-12 grossly intact, no  focal deficits. PSYCHIATRIC: The patient is alert and oriented x 2. SKIN: No rash, lesion, or ulcer.   LABORATORY PANEL:   CBC Recent Labs  Lab 04/26/19 0452  WBC 11.6*  HGB 12.2  HCT 38.2  PLT 166   ------------------------------------------------------------------------------------------------------------------  Chemistries  Recent Labs  Lab 04/21/19 0648  04/25/19 0543 04/26/19 0452  NA 145   < > 143 142  K 2.8*   < > 2.9* 3.2*  CL 97*   < > 106 110  CO2 37*   < > 28 26  GLUCOSE 208*   < > 70 186*  BUN 29*   < > 11 12  CREATININE 1.71*   < > 0.91 0.99  CALCIUM 7.8*   < > 8.6* 8.9  MG 1.8  --   --   --   AST  --    < > 29  --   ALT  --    < > 19  --   ALKPHOS  --    < > 123  --   BILITOT  --    < > 0.7  --    < > = values in this interval not displayed.   ------------------------------------------------------------------------------------------------------------------  Cardiac Enzymes No results for input(s): TROPONINI in the last 168 hours. ------------------------------------------------------------------------------------------------------------------  RADIOLOGY:  No results found.  ASSESSMENT AND PLAN:   Uncontrolled type 2 diabetes- initially with DKA, but this has resolved. -Continue Humulin R and moderate SSI, elevated blood sugar up to 463, increase Humulin R to 55 units daily, 40 units with supper. Acute on chronic systolic congestive heart failure- ECHO with moderately  reduced LVEF with severe hypokinesis.,  Continue beta-blockers, Entresto, added spironolactone today. -Started on Entresto this admission -Continue ivabradine and Coreg -Cardiology following, status post right heart catheterization showing normal filling pressures with cardiac index 2.23, reduced cardiac output of 3.91. Patient ejection fraction initially was 35 to 40% on admission   Hypokalemia -Replete and recheck  Elevated troponin-likely demand ischemia versus stress-induced  cardiomyopathy. -s/p cardiac cath 7/23 that showed stable appearance of the coronary arteries -Continue aspirin, plavix, and statin  Thrombocytopenia- new this admission. Concern for HIT vs other process. -HIT antibody is pending -Peripheral smear unremarkable -Patient is now off heparin- start fondaparinux for DVT prophylaxis  Acute encephalopathy- resolved.  Concerned that patient may have some underlying dementia. -MRI brain and EEG negative. -Patient has been seen by neurology  Acute urinary retention -Urinary catheter placed by urology on admission   Sepsis- ruled out.  Blood and urine cultures are negative.  Off antibiotics.  Hypertension- stable.  Hyperlipidemia- stable -Continue home statin  Patient is medically stable for discharge. Awaiting SNF placement.  All the records are reviewed and case discussed with Care Management/Social Workerr. Management plans discussed with the patient, family and they are in agreement.  CODE STATUS: Full.  TOTAL TIME TAKING CARE OF THIS PATIENT: 35 minutes.   POSSIBLE D/C IN 1-2 DAYS, DEPENDING ON CLINICAL CONDITION.   Epifanio Lesches M.D on 04/26/2019   Between 7am to 6pm - Pager - [phone]  After 6pm go to [email or web address] - password [email or web address] [facility]  Office  [phone]  CC: Primary care physician; Owens Loffler, MD  Note: This dictation was prepared with Dragon dictation along with smaller phrase technology. Any transcriptional errors that result from this process are unintentional.

## 2019-04-26 NOTE — Progress Notes (Signed)
MD made aware of FSBS 463

## 2019-04-26 NOTE — Progress Notes (Signed)
Progress Note  Patient Name: Kristy [name] Date of Encounter: 04/26/2019  Primary Cardiologist: Ida Rogue, MD   Subjective   " I can walk fine without any help" " Peeing a lot" Overall reports that she feels well with no complaints, denies shortness of breath " My brother told me I am not coming home today"  Inpatient Medications    Scheduled Meds: . aspirin EC  81 mg Oral Daily  . atorvastatin  80 mg Oral q1800  . carvedilol  3.125 mg Oral BID WC  . clopidogrel  75 mg Oral Q breakfast  . famotidine  20 mg Oral Daily  . FLUoxetine  20 mg Oral Daily  . fondaparinux (ARIXTRA) injection  2.5 mg Subcutaneous Daily  . insulin aspart  0-15 Units Subcutaneous TID WC  . insulin aspart  0-5 Units Subcutaneous QHS  . insulin regular human CONCENTRATED  40 Units Subcutaneous Q supper  . insulin regular human CONCENTRATED  50 Units Subcutaneous Q breakfast  . ivabradine  5 mg Oral BID WC  . mouth rinse  15 mL Mouth Rinse BID  . potassium chloride  40 mEq Oral Q4H  . sacubitril-valsartan  1 tablet Oral BID  . sodium chloride flush  3 mL Intravenous Q12H  . sodium chloride flush  3 mL Intravenous Q12H   Continuous Infusions: . sodium chloride    . sodium chloride     PRN Meds: sodium chloride, sodium chloride, acetaminophen **OR** acetaminophen, [DISCONTINUED] ondansetron **OR** ondansetron (ZOFRAN) IV, sodium chloride flush, sodium chloride flush, traZODone   Vital Signs    Vitals:   04/25/19 1701 04/25/19 1920 04/26/19 0304 04/26/19 0719  BP: 131/76 132/60 (!) 164/70 (!) 166/84  Pulse: 76 79 79 (!) 101  Resp: 18 14 (!) 24   Temp:  98.5 F (36.9 C) 99.9 F (37.7 C) 98.3 F (36.8 C)  TempSrc:  Oral Oral Oral  SpO2: 97% 93% 96% 98%  Weight:   71.1 kg   Height:        Intake/Output Summary (Last 24 hours) at 04/26/2019 1203 Last data filed at 04/25/2019 1839 Gross per 24 hour  Intake 480 ml  Output 500 ml  Net -20 ml   Last 3 Weights 04/26/2019 04/25/2019  04/24/2019  Weight (lbs) 156 lb 11.2 oz 161 lb 6.4 oz 163 lb 12.8 oz  Weight (kg) 71.079 kg 73.211 kg 74.3 kg      Telemetry    Sinus rhythm- Personally Reviewed  ECG    No new tracing.  Physical Exam   Constitutional:  oriented to person, place, and time. No distress.  HENT:  Head: Normocephalic and atraumatic.  Eyes:  no discharge. No scleral icterus.  Neck: Normal range of motion. Neck supple. No JVD present.  Cardiovascular: Normal rate, regular rhythm, normal heart sounds and intact distal pulses. Exam reveals no gallop and no friction rub. No edema No murmur heard. Pulmonary/Chest: Effort normal and breath sounds normal. No stridor. No respiratory distress.  no wheezes.  no rales.  no tenderness.  Abdominal: Soft.  no distension.  no tenderness.  Musculoskeletal: Normal range of motion.  no  tenderness or deformity.  Neurological:  normal muscle tone. Coordination normal. No atrophy Skin: Skin is warm and dry. No rash noted. not diaphoretic.  Psychiatric:  normal mood and affect. behavior is normal. Thought content normal.    Labs    High Sensitivity Troponin:   Recent Labs  Lab 04/20/19 0413 04/20/19 0850 04/21/19 1856  TROPONINIHS  9,242* 6,834* 2,950*      Cardiac EnzymesNo results for input(s): TROPONINI in the last 168 hours. No results for input(s): TROPIPOC in the last 168 hours.   Chemistry Recent Labs  Lab 04/19/19 1854  04/22/19 0700  04/24/19 0602 04/25/19 0543 04/26/19 0452  NA 130*   < > 138   < > 141 143 142  K 5.0   < > 3.5   < > 4.4 2.9* 3.2*  CL 86*   < > 95*   < > 104 106 110  CO2 12*   < > 32   < > 28 28 26   GLUCOSE 1,161*   < > 350*   < > 129* 70 186*  BUN 34*   < > 19   < > 15 11 12   CREATININE 2.26*   < > 1.20*   < > 0.94 0.91 0.99  CALCIUM 9.7   < > 7.5*   < > 8.6* 8.6* 8.9  PROT 7.7  --  5.2*  --   --  5.4*  --   ALBUMIN 3.9  --  2.6*  --   --  2.4*  --   AST 45*  --  45*  --   --  29  --   ALT 26  --  21  --   --  19  --    ALKPHOS 146*  --  74  --   --  123  --   BILITOT 1.9*  --  1.3*  --   --  0.7  --   GFRNONAA 22*   < > 47*   < > >60 >60 59*  GFRAA 25*   < > 54*   < > >60 >60 >60  ANIONGAP 32*   < > 11   < > 9 9 6    < > = values in this interval not displayed.     Hematology Recent Labs  Lab 04/24/19 0602 04/25/19 0543 04/26/19 0452  WBC 13.2* 11.5* 11.6*  RBC 4.68 4.13 4.06  HGB 14.0 12.4 12.2  HCT 44.8 38.6 38.2  MCV 95.7 93.5 94.1  MCH 29.9 30.0 30.0  MCHC 31.3 32.1 31.9  RDW 13.6 13.5 13.7  PLT 134* 131* 166    BNPNo results for input(s): BNP, PROBNP in the last 168 hours.   DDimer No results for input(s): DDIMER in the last 168 hours.   Radiology    No results found.  Cardiac Studies   [facility] 04/24/2019: 1. Stable appearance of coronary arteries since 08/2018 without new lesion to explain recent decline in LVEF or troponin elevation.  I suspect elevated troponin may be due to supply-demand mismatch and/or stress-induced cardiomyopathy in the setting of severe DKA. 2. Focal D1 stenosis remains ~80%.  There is mild to moderate, non-obstructive disease involving the LAD and LCx. 3. Mildly elevated left ventricular filling pressure. __________  2D echo 04/21/2019: 1. Severe hypokinesis of the left ventricular, mid-apical anterior wall and anteroseptal wall. 2. The left ventricle has moderately reduced systolic function, with an ejection fraction of 35-40%. The cavity size was normal. Left ventricular diastolic function could not be evaluated. 3. The right ventricle has normal systolic function. The cavity was normal. There is mildly increased right ventricular wall thickness. 4. Left atrial size was not well visualized. 5. The aortic valve is tricuspid. Mild thickening of the aortic valve. Aortic valve regurgitation was not assessed by color flow Doppler. Mild aortic annular calcification noted. 6. The mitral  valve was not well visualized. There is mild mitral annular calcification  present. 7. The aortic root is normal in size and structure. 8. The interatrial septum was not well visualized. __________  2D echo3/07/2019: 1. The left ventricle has low normal systolic function, with an ejection fraction of 50-55%. The cavity size was normal. There is moderate concentric left ventricular hypertrophy. Left ventricular diastolic Doppler parameters are consistent with impaired relaxation. 2. The right ventricle has normal systolic function. The cavity was normal. There is no increase in right ventricular wall thickness. 3. Left atrial size was mildly dilated. 4. The mitral valve is grossly normal. 5. The tricuspid valve is grossly normal. 6. The aortic valve is grossly normal. 7. Significant improvement in EF since last echo. __________  [facility] 08/03/2018: 1. Significant one-vessel coronary artery disease involving first diagonal with mild to moderate disease in the main vessels.  2. Left ventricular angiography was not performed. EF was severely reduced by echo. 3. Right heart catheterization showed normal filling pressures, normal pulmonary pressure and mildly reduced cardiac output at 3.91 with a cardiac index of 2.23.  Patient Profile     68 y.o. female with history of nonischemic cardiomyopathy with normalization of LVEF, nonobstructive coronary artery disease, hypertension, hyperlipidemia, uncontrolled diabetes mellitus,and Crohn's disease,whowas admitted with altered mental status felt to be likely metabolic encephalopathy in the setting of diabetic ketoacidosis andis being seen today for the evaluation ofelevated troponin.  Assessment & Plan    Elevated troponin: Demand ischemia in the setting of nonischemic cardiomyopathy, severe DKA.   -Catheterization this admission showed stable appearance of coronary arteries .  Medical management -Continue aspirin Plavix, statin  Acute on chronic systolic heart failure: History of nonischemic  cardiomyopathy, ejection fraction 20 to 25% in November 2019 Improved up to 50 to 55% in March 2020 Ejection fraction 35 to 40% admission in the setting of DKA  LVEDP was mildly elevated on cath  We will continue carvedilol 3.125 mg twice daily , increased dose of Entresto up to 49/51 mg twice daily,  Ivabradine/corlanor Add spironolactone 25 mg daily ----Medication compliance at home likely an issue  Sinus tachycardia: Continue carvedilol and ivabradine.  DKA:  medication noncompliance at home  uncontrolled diabetes mellitus  Followed [facility], poor follow-up recently in the setting of COVID   Acute kidney injury: Secondary to DKA Renal function improved  DVT prophylaxis   Total encounter time more than 25 minutes  Greater than 50% was spent in counseling and coordination of care with the patient     For questions or updates, please contact [place] Please consult [email or web address] for contact info under [facility].     Signed, Ida Rogue, MD  04/26/2019, 12:03 PM

## 2019-04-26 NOTE — Progress Notes (Signed)
Physical Therapy Treatment Patient Details Name: Kristy [name] MRN: [id number] DOB: 06/28/1951 Today's Date: 04/26/2019    History of Present Illness 68 y/o female presented to ER status post fall in home environment, unable to self-recover, reporting progressive weakness, AMS and general non-compliance with medications; admitted for management of DKA, metabolic encephalopathy.  Noted with elevation in troponin (peak at 5600), s/p cardiac cath (7/23) with no new lesions noted (elevated troponin likely demand ischemia).    PT Comments    Pt did much better with PT session this date and was able to ambulate safely and confidently with and w/o AD.  She maintained consistent cadence, was able to negotiate up/down steps w/ rail and w/o issue and showed good strength and quality of motion with exercises.  Pt remains confused and lacks awareness of her situation.  Overall she shows physical ability to go home but will need at least some regular guidance/daily check-ins and preferably 24/7 assist depending on mental status at time of d/c.  Per today's awareness pt is not at all safe to be home alone.  Follow Up Recommendations  Supervision/Assistance - 24 hour;Home health PT     Equipment Recommendations  Rolling walker with 5" wheels    Recommendations for Other Services       Precautions / Restrictions Precautions Precautions: Fall Restrictions Weight Bearing Restrictions: No    Mobility  Bed Mobility Overal bed mobility: Independent Bed Mobility: Supine to Sit     Supine to sit: Modified independent (Device/Increase time)     General bed mobility comments: Able to get to EOB w/o assist  Transfers Overall transfer level: Needs assistance Equipment used: Rolling walker (2 wheeled) Transfers: Sit to/from Stand Sit to Stand: Modified independent (Device/Increase time)         General transfer comment: Pt able to easily rise to standing   Ambulation/Gait Ambulation/Gait  assistance: Modified independent (Device/Increase time) Gait Distance (Feet): 400 Feet Assistive device: None;Rolling walker (2 wheeled)       General Gait Details: Ambulated initially with walker, however quickly able to show safety and competence with minimal UE use and did remainder of ambulation w/o AD.  Good speed and confidence, no LOBs, needed only consistent directional cues secondary to confusion   Stairs Stairs: Yes Stairs assistance: Modified independent (Device/Increase time) Stair Management: Two rails Number of Stairs: 6 General stair comments: Pt able to negotiate up/down steps w/o assist   Wheelchair Mobility    Modified Rankin (Stroke Patients Only)       Balance Overall balance assessment: Needs assistance Sitting-balance support: No upper extremity supported;Feet supported Sitting balance-Leahy Scale: Good     Standing balance support: No upper extremity supported Standing balance-Leahy Scale: Fair Standing balance comment: Pt did well with static and dynamic balance, no stagger steps t/o gait activities                            Cognition Arousal/Alertness: Awake/alert Behavior During Therapy: WFL for tasks assessed/performed Overall Cognitive Status: No family/caregiver present to determine baseline cognitive functioning                                 General Comments: Pt continues to display confusion and decreased situational awareness      Exercises General Exercises - Lower Extremity Ankle Circles/Pumps: AROM;10 reps Long Arc Quad: Strengthening;10 reps Heel Slides: Strengthening;10 reps Hip ABduction/ADduction: Strengthening;10  reps Hip Flexion/Marching: Strengthening;10 reps    General Comments        Pertinent Vitals/Pain Pain Assessment: No/denies pain    Home Living                      Prior Function            PT Goals (current goals can now be found in the care plan section) Progress  towards PT goals: Progressing toward goals    Frequency    Min 2X/week      PT Plan Discharge plan needs to be updated    Co-evaluation              AM-PAC PT "6 Clicks" Mobility   Outcome Measure  Help needed turning from your back to your side while in a flat bed without using bedrails?: None Help needed moving from lying on your back to sitting on the side of a flat bed without using bedrails?: None Help needed moving to and from a bed to a chair (including a wheelchair)?: None Help needed standing up from a chair using your arms (e.g., wheelchair or bedside chair)?: None Help needed to walk in hospital room?: None Help needed climbing 3-5 steps with a railing? : None 6 Click Score: 24    End of Session Equipment Utilized During Treatment: Gait belt Activity Tolerance: Patient tolerated treatment well Patient left: with call bell/phone within reach;with chair alarm set Nurse Communication: Mobility status PT Visit Diagnosis: Difficulty in walking, not elsewhere classified (R26.2);Muscle weakness (generalized) (M62.81)     Time: 9211-9417 PT Time Calculation (min) (ACUTE ONLY): 28 min  Charges:  $Gait Training: 8-22 mins $Therapeutic Exercise: 8-22 mins                     Kreg Shropshire, DPT 04/26/2019, 2:47 PM

## 2019-04-27 DIAGNOSIS — I420 Dilated cardiomyopathy: Secondary | ICD-10-CM

## 2019-04-27 LAB — GLUCOSE, CAPILLARY
Glucose-Capillary: 233 mg/dL — ABNORMAL HIGH (ref 70–99)
Glucose-Capillary: 313 mg/dL — ABNORMAL HIGH (ref 70–99)
Glucose-Capillary: 240 mg/dL — ABNORMAL HIGH (ref 70–99)
Glucose-Capillary: 198 mg/dL — ABNORMAL HIGH (ref 70–99)

## 2019-04-27 MED ORDER — INSULIN REGULAR HUMAN (CONC) 500 UNIT/ML ~~LOC~~ SOPN
42.0000 [IU] | PEN_INJECTOR | Freq: Every day | SUBCUTANEOUS | Status: DC
  Administered 2019-04-27: 40 [IU] via SUBCUTANEOUS
  Filled 2019-04-27: qty 3

## 2019-04-27 NOTE — Progress Notes (Signed)
Progress Note  Patient Name: Kristy [name] Date of Encounter: 04/27/2019  Primary Cardiologist: Ida Rogue, MD   Subjective   Reports that she feels well this morning, no complaints Denies shortness of breath or chest discomfort Ambulating without assistance Denies leg swelling Medications adjusted yesterday, Entresto dose increased and started on spironolactone   Inpatient Medications    Scheduled Meds: . aspirin EC  81 mg Oral Daily  . atorvastatin  80 mg Oral q1800  . carvedilol  3.125 mg Oral BID WC  . clopidogrel  75 mg Oral Q breakfast  . famotidine  20 mg Oral Daily  . FLUoxetine  20 mg Oral Daily  . fondaparinux (ARIXTRA) injection  2.5 mg Subcutaneous Daily  . insulin aspart  0-15 Units Subcutaneous TID WC  . insulin aspart  0-5 Units Subcutaneous QHS  . insulin regular human CONCENTRATED  40 Units Subcutaneous Q supper  . insulin regular human CONCENTRATED  55 Units Subcutaneous Q breakfast  . ivabradine  5 mg Oral BID WC  . mouth rinse  15 mL Mouth Rinse BID  . sacubitril-valsartan  1 tablet Oral BID  . sodium chloride flush  3 mL Intravenous Q12H  . sodium chloride flush  3 mL Intravenous Q12H  . spironolactone  25 mg Oral Daily   Continuous Infusions: . sodium chloride    . sodium chloride     PRN Meds: sodium chloride, sodium chloride, acetaminophen **OR** acetaminophen, [DISCONTINUED] ondansetron **OR** ondansetron (ZOFRAN) IV, sodium chloride flush, sodium chloride flush, traZODone   Vital Signs    Vitals:   04/26/19 1702 04/26/19 1953 04/27/19 0540 04/27/19 0732  BP: (!) 145/78 (!) 128/56 (!) 143/69 123/86  Pulse: 87 83 85 (!) 106  Resp:  20 20   Temp: 99.4 F (37.4 C) 98.5 F (36.9 C) 98.7 F (37.1 C) 98.1 F (36.7 C)  TempSrc: Oral Oral Oral Oral  SpO2: 98% 95% 97% 97%  Weight:   74 kg   Height:        Intake/Output Summary (Last 24 hours) at 04/27/2019 1357 Last data filed at 04/26/2019 1851 Gross per 24 hour  Intake 240 ml   Output -  Net 240 ml   Last 3 Weights 04/27/2019 04/26/2019 04/25/2019  Weight (lbs) 163 lb 3.2 oz 156 lb 11.2 oz 161 lb 6.4 oz  Weight (kg) 74.027 kg 71.079 kg 73.211 kg      Telemetry    Sinus rhythm- Personally Reviewed  ECG    No new tracing.  Physical Exam   Constitutional:  oriented to person, place, and time. No distress.  HENT:  Head: Normocephalic and atraumatic.  Eyes:  no discharge. No scleral icterus.  Neck: Normal range of motion. Neck supple. No JVD present.  Cardiovascular: Normal rate, regular rhythm, normal heart sounds and intact distal pulses. Exam reveals no gallop and no friction rub. No edema No murmur heard. Pulmonary/Chest: Effort normal and breath sounds normal. No stridor. No respiratory distress.  no wheezes.  no rales.  no tenderness.  Abdominal: Soft.  no distension.  no tenderness.  Musculoskeletal: Normal range of motion.  no  tenderness or deformity.  Neurological:  normal muscle tone. Coordination normal. No atrophy Skin: Skin is warm and dry. No rash noted. not diaphoretic.  Psychiatric:  normal mood and affect. behavior is normal. Thought content normal.    Labs    High Sensitivity Troponin:   Recent Labs  Lab 04/20/19 0413 04/20/19 0850 04/21/19 1856  TROPONINIHS 5,057* 5,667*  2,950*      Cardiac EnzymesNo results for input(s): TROPONINI in the last 168 hours. No results for input(s): TROPIPOC in the last 168 hours.   Chemistry Recent Labs  Lab 04/22/19 0700  04/24/19 0602 04/25/19 0543 04/26/19 0452  NA 138   < > 141 143 142  K 3.5   < > 4.4 2.9* 3.2*  CL 95*   < > 104 106 110  CO2 32   < > 28 28 26   GLUCOSE 350*   < > 129* 70 186*  BUN 19   < > 15 11 12   CREATININE 1.20*   < > 0.94 0.91 0.99  CALCIUM 7.5*   < > 8.6* 8.6* 8.9  PROT 5.2*  --   --  5.4*  --   ALBUMIN 2.6*  --   --  2.4*  --   AST 45*  --   --  29  --   ALT 21  --   --  19  --   ALKPHOS 74  --   --  123  --   BILITOT 1.3*  --   --  0.7  --   GFRNONAA  47*   < > >60 >60 59*  GFRAA 54*   < > >60 >60 >60  ANIONGAP 11   < > 9 9 6    < > = values in this interval not displayed.     Hematology Recent Labs  Lab 04/24/19 0602 04/25/19 0543 04/26/19 0452  WBC 13.2* 11.5* 11.6*  RBC 4.68 4.13 4.06  HGB 14.0 12.4 12.2  HCT 44.8 38.6 38.2  MCV 95.7 93.5 94.1  MCH 29.9 30.0 30.0  MCHC 31.3 32.1 31.9  RDW 13.6 13.5 13.7  PLT 134* 131* 166    BNPNo results for input(s): BNP, PROBNP in the last 168 hours.   DDimer No results for input(s): DDIMER in the last 168 hours.   Radiology    No results found.  Cardiac Studies   LHC 04/24/2019: 1. Stable appearance of coronary arteries since 08/2018 without new lesion to explain recent decline in LVEF or troponin elevation.  I suspect elevated troponin may be due to supply-demand mismatch and/or stress-induced cardiomyopathy in the setting of severe DKA. 2. Focal D1 stenosis remains ~80%.  There is mild to moderate, non-obstructive disease involving the LAD and LCx. 3. Mildly elevated left ventricular filling pressure. __________  2D echo 04/21/2019: 1. Severe hypokinesis of the left ventricular, mid-apical anterior wall and anteroseptal wall. 2. The left ventricle has moderately reduced systolic function, with an ejection fraction of 35-40%. The cavity size was normal. Left ventricular diastolic function could not be evaluated. 3. The right ventricle has normal systolic function. The cavity was normal. There is mildly increased right ventricular wall thickness. 4. Left atrial size was not well visualized. 5. The aortic valve is tricuspid. Mild thickening of the aortic valve. Aortic valve regurgitation was not assessed by color flow Doppler. Mild aortic annular calcification noted. 6. The mitral valve was not well visualized. There is mild mitral annular calcification present. 7. The aortic root is normal in size and structure. 8. The interatrial septum was not well visualized.  __________  2D echo3/07/2019: 1. The left ventricle has low normal systolic function, with an ejection fraction of 50-55%. The cavity size was normal. There is moderate concentric left ventricular hypertrophy. Left ventricular diastolic Doppler parameters are consistent with impaired relaxation. 2. The right ventricle has normal systolic function. The cavity was normal.  There is no increase in right ventricular wall thickness. 3. Left atrial size was mildly dilated. 4. The mitral valve is grossly normal. 5. The tricuspid valve is grossly normal. 6. The aortic valve is grossly normal. 7. Significant improvement in EF since last echo. __________  [facility] 08/03/2018: 1. Significant one-vessel coronary artery disease involving first diagonal with mild to moderate disease in the main vessels.  2. Left ventricular angiography was not performed. EF was severely reduced by echo. 3. Right heart catheterization showed normal filling pressures, normal pulmonary pressure and mildly reduced cardiac output at 3.91 with a cardiac index of 2.23.  Patient Profile     68 y.o. female with history of nonischemic cardiomyopathy with normalization of LVEF, nonobstructive coronary artery disease, hypertension, hyperlipidemia, uncontrolled diabetes mellitus,and Crohn's disease,whowas admitted with altered mental status felt to be likely metabolic encephalopathy in the setting of diabetic ketoacidosis andis being seen today for the evaluation ofelevated troponin.  Assessment & Plan    Elevated troponin: Demand ischemia in the setting of nonischemic cardiomyopathy, severe DKA.   -Catheterization with stable appearance of coronary arteries .   -Continue aspirin Plavix, statin  Acute on chronic systolic heart failure: History of nonischemic cardiomyopathy, ejection fraction 20 to 25% in November 2019 Improved up to 50 to 55% in March 2020 Ejection fraction 35 to 40% admission in the setting of  DKA ----Continue carvedilol 3.125 mg twice daily ,  Entresto 49/51 mg twice daily,  Ivabradine/corlanor spironolactone 25 mg daily ----Medication compliance at home likely an issue Needs visiting home health.  Poor insight into her medical issues  Sinus tachycardia: Continue carvedilol and ivabradine.  DKA:  medication noncompliance at home, poor insight  uncontrolled diabetes mellitus  Hospital admissions for DKA Followed at [facility] endocrinology, poor follow-up recently in the setting of COVID  Acute kidney injury: Secondary to DKA Renal function back to baseline  DVT prophylaxis   Total encounter time more than 25 minutes  Greater than 50% was spent in counseling and coordination of care with the patient     For questions or updates, please contact [place] Please consult [email or web address] for contact info under [facility] Cardiology.     Signed, Ida Rogue, MD  04/27/2019, 1:57 PM

## 2019-04-27 NOTE — TOC Initial Note (Signed)
Transition of Care [facility]) - Initial/Assessment Note    Patient Details  Name: Kristy [name] MRN: [id number] Date of Birth: 1951/09/19  Transition of Care [facility]) CM/SW Contact:    Latanya Maudlin, RN Phone Number: 04/27/2019, 8:35 AM  Clinical Narrative:  [facility] team met with patient to discuss disposition. Per patient she lives at home alone and has sustained several falls. She uses a walker and cane occasionally at home. She is somewhat confused right now which does not appear to be her baseline, due to the confusion MD and PT are concerned about a home discharge alone however the patient demonstrated improving strength and was able to ambulate 400 feet. Patient states her niece and daughter check on her and can assist with transportation. When I mentioned possibly staying with one of them or having one of them stay with the patient she was unsure if that was an option. If patient remains confused prior to dc we may need to contact family in regards to supports. At this time patient is agreeable to PT recommendation of home health. CMS Medicare.gov Compare Post Acute Care list reviewed with patient and she has no preference of agency. Notified [place] from [place] of referral. Will need home health orders prior to discharge.        Expected Discharge Plan: [place] Barriers to Discharge: Continued Medical Work up   Patient Goals and CMS Choice Patient states their goals for this hospitalization and ongoing recovery are:: to get back on my feet CMS Medicare.gov Compare Post Acute Care list provided to:: Patient Choice offered to / list presented to : Patient  Expected Discharge Plan and Services Expected Discharge Plan: [place]   Discharge Planning Services: CM Consult Post Acute Care Choice: [place] arrangements for the past 2 months: Single Family Home                           HH Arranged: PT, Nurse's Aide HH Agency: [facility]  Health Date [facility] Agency Contacted: 04/27/19 Time [place]: [phone] Representative spoke with at [facility]: [place] Arrangements/Services Living arrangements for the past 2 months: [place] with:: Self Patient language and need for interpreter reviewed:: Yes Do you feel safe going back to the place where you live?: Yes      Need for Family Participation in Patient Care: No (Comment)   Current home services: DME Criminal Activity/Legal Involvement Pertinent to Current Situation/Hospitalization: No - Comment as needed  Activities of Daily Living      Permission Sought/Granted                  Emotional Assessment Appearance:: Appears stated age Attitude/Demeanor/Rapport: Engaged Affect (typically observed): Stable Orientation: : Oriented to Self, Oriented to Place      Admission diagnosis:  Diabetic ketoacidosis without coma associated with other specified diabetes mellitus ([place]) [E13.10] Sepsis, due to unspecified organism, unspecified whether acute organ dysfunction present [facility]) [A41.9] Pneumonia [J18.9] Patient Active Problem List   Diagnosis Date Noted  . Elevated troponin   . Sepsis ([place])   . DKA (diabetic ketoacidoses) ([place]) 04/19/2019  . Pneumonia 04/19/2019  . NICM (nonischemic cardiomyopathy) ([place]) 11/08/2018  . Acute on chronic HFrEF (heart failure with reduced ejection fraction) ([place]) 08/22/2018  . Coronary artery disease, non-occlusive 09/13/2016  . Sinus tachycardia 09/20/2015  . Primary osteoarthritis involving multiple joints 03/14/2015  . Personal  history of other malignant neoplasm of skin 12/01/2014  . Crohn's disease (HCC) 10/13/2013  . Major depressive disorder, recurrent, in full remission (HCC) 05/01/2011  . TRANSAMINASES, SERUM, ELEVATED 05/01/2010  . PSORIASIS 08/02/2009  . Hyperlipidemia 05/11/2009  . GOUT 02/05/2009  . Type 2 diabetes mellitus without complication, with long-term current use of  insulin (HCC)   . Essential hypertension 04/18/2007  . LBBB (left bundle branch block) 04/18/2007  . ALLERGIC RHINITIS 04/18/2007  . GALLSTONES 04/18/2007   PCP:  Copland, Spencer, MD Pharmacy:   [facility] - [place], [place] - [address] [place] [address] Phone: [phone] Fax: [phone]  [facility] - [place], [place] - [address] [place] [address] Phone: [phone] Fax: [phone]  [facility] [place] - [address] [place] [address] Phone: [phone] Fax: [phone]     Social Determinants of Health (SDOH) Interventions    Readmission Risk Interventions Readmission Risk Prevention Plan 04/27/2019  Post Dischage Appt Complete  Medication Screening Complete  Transportation Screening Complete  Some recent data might be hidden    

## 2019-04-27 NOTE — Progress Notes (Signed)
[place] [facility] [place] NAME: Kristy [name]    MR#:  [id number]  DATE OF BIRTH:  1951/07/28  SUBJECTIVE:   Slight nausea today but otherwise stable. REVIEW OF SYSTEMS:   Review of Systems  Constitutional: Negative for chills and fever.  HENT: Negative for congestion, hearing loss and sore throat.   Eyes: Negative for blurred vision, double vision and photophobia.  Respiratory: Negative for cough, hemoptysis and shortness of breath.   Cardiovascular: Negative for chest pain, palpitations, orthopnea and leg swelling.  Gastrointestinal: Positive for nausea. Negative for abdominal pain, diarrhea and vomiting.  Genitourinary: Negative for dysuria and urgency.  Musculoskeletal: Negative for back pain, myalgias and neck pain.  Skin: Negative for rash.  Neurological: Negative for dizziness, focal weakness, seizures, weakness and headaches.  Psychiatric/Behavioral: Negative for depression and memory loss. The patient is not nervous/anxious and does not have insomnia.     DRUG ALLERGIES:   Allergies  Allergen Reactions  . Fish Oil Other (See Comments)    Gout  . Glimepiride     REACTION: hypoglycemia  . Guanfacine Hcl     REACTION: unspecified  . Rosiglitazone Other (See Comments)    CHF    VITALS:  Blood pressure 123/86, pulse (!) 106, temperature 98.1 F (36.7 C), temperature source Oral, resp. rate 20, height 5' 5"  (1.651 m), weight 74 kg, SpO2 97 %.  PHYSICAL EXAMINATION:   GENERAL:  68 y.o.-year-old patient lying in the bed, in NAD EYES: Pupils equal, round, reactive to light and accommodation. No scleral icterus.  HEENT: Head atraumatic, normocephalic. Oropharynx and nasopharynx clear.  NECK:  Supple, no jugular venous distention. No thyroid enlargement, no tenderness.  LUNGS: Normal breath sounds bilaterally, no wheezing, rales,rhonchi or crepitation. No use of accessory muscles of respiration. +Box in place. CARDIOVASCULAR: RRR,  S1, S2 normal. No murmurs, rubs, or gallops.  ABDOMEN: Soft, nontender, nondistended. Bowel sounds present. No organomegaly or mass.  EXTREMITIES: No pedal edema, cyanosis, or clubbing.  NEUROLOGIC: CN 2-12 grossly intact, no focal deficits. PSYCHIATRIC: The patient is alert and oriented x 2. SKIN: No rash, lesion, or ulcer.   LABORATORY PANEL:   CBC Recent Labs  Lab 04/26/19 0452  WBC 11.6*  HGB 12.2  HCT 38.2  PLT 166   ------------------------------------------------------------------------------------------------------------------  Chemistries  Recent Labs  Lab 04/21/19 0648  04/25/19 0543 04/26/19 0452  NA 145   < > 143 142  K 2.8*   < > 2.9* 3.2*  CL 97*   < > 106 110  CO2 37*   < > 28 26  GLUCOSE 208*   < > 70 186*  BUN 29*   < > 11 12  CREATININE 1.71*   < > 0.91 0.99  CALCIUM 7.8*   < > 8.6* 8.9  MG 1.8  --   --   --   AST  --    < > 29  --   ALT  --    < > 19  --   ALKPHOS  --    < > 123  --   BILITOT  --    < > 0.7  --    < > = values in this interval not displayed.   ------------------------------------------------------------------------------------------------------------------  Cardiac Enzymes No results for input(s): TROPONINI in the last 168 hours. ------------------------------------------------------------------------------------------------------------------  RADIOLOGY:  No results found.  ASSESSMENT AND PLAN:   Uncontrolled type 2 diabetes- initially with DKA, but this has resolved. -Continue Humulin R  and moderate SSI, elevated blood sugar up to 463, increased Humulin R to 55 units daily, 40 units with supper. Noncompliance at home.  Blood sugars better controlled than before.  Acute on chronic systolic congestive heart failure- ECHO with moderately reduced LVEF with severe hypokinesis.,  Continue beta-blockers, Entresto, added spironolactone today. -Started on Entresto this admission -Continue ivabradine and Coreg -Cardiology  following, status post right heart catheterization showing normal filling pressures with cardiac index 2.23, reduced cardiac output of 3.91. Patient ejection fraction initially was 35 to 40% on admission Spoke with Dr. Esmond Plants, patient has history of noncompliance, usually her ejection fraction goes down when she is sick but improves later but patient should continue Entresto ivabradine    Hypokalemia -Replete and recheck  Elevated troponin-likely demand ischemia versus stress-induced cardiomyopathy. -s/p cardiac cath 7/23 that showed stable appearance of the coronary arteries -Continue aspirin, plavix, and statin  Thrombocytopenia- new this admission. Concern for HIT vs other process.  Platelets are better now up to 166 today. HIT antibody is not elevated. -Peripheral smear unremarkable -Patient is now off heparin-patient is on Arixtra at this time.  Acute encephalopathy- resolved.  Concerned that patient may have some underlying dementia. -MRI brain and EEG negative. -Patient has been seen by neurology, episodes of confusion on and off  Acute urinary retention -Urinary catheter placed by urology on admission   Sepsis- ruled out.  Blood and urine cultures are negative.  Off antibiotics.  Hypertension- stable.  Hyperlipidemia- stable -Continue home statin  We will discharge home tomorrow with home health.  Patient lives alone, because of weakness patient is to have family support. All the records are reviewed and case discussed with Care Management/Social Workerr. Management plans discussed with the patient, family and they are in agreement.  CODE STATUS: Full.  TOTAL TIME TAKING CARE OF THIS PATIENT: 35 minutes.   POSSIBLE D/C IN 1-2 DAYS, DEPENDING ON CLINICAL CONDITION.   Epifanio Lesches M.D on 04/27/2019   Between 7am to 6pm - Pager - [phone]  After 6pm go to [email or web address] - password [email or web address] [facility]  Office   [phone]  CC: Primary care physician; Owens Loffler, MD  Note: This dictation was prepared with Dragon dictation along with smaller phrase technology. Any transcriptional errors that result from this process are unintentional.

## 2019-04-28 LAB — GLUCOSE, CAPILLARY
Glucose-Capillary: 216 mg/dL — ABNORMAL HIGH (ref 70–99)
Glucose-Capillary: 303 mg/dL — ABNORMAL HIGH (ref 70–99)

## 2019-04-28 MED ORDER — CARVEDILOL 6.25 MG PO TABS: 6.2500 mg | ORAL_TABLET | Freq: Two times a day (BID) | ORAL | Status: DC

## 2019-04-28 MED ORDER — ASPIRIN 81 MG PO TBEC: 81.0000 mg | DELAYED_RELEASE_TABLET | Freq: Every day | ORAL | Status: AC

## 2019-04-28 MED ORDER — ATORVASTATIN CALCIUM 80 MG PO TABS: 80.0000 mg | ORAL_TABLET | Freq: Every day | ORAL | 0 refills | Status: DC

## 2019-04-28 MED ORDER — SPIRONOLACTONE 25 MG PO TABS: 25.0000 mg | ORAL_TABLET | Freq: Every day | ORAL | 0 refills | Status: DC

## 2019-04-28 MED ORDER — CLOPIDOGREL BISULFATE 75 MG PO TABS: 75.0000 mg | ORAL_TABLET | Freq: Every day | ORAL | 0 refills | Status: DC

## 2019-04-28 MED ORDER — CARVEDILOL 6.25 MG PO TABS
6.2500 mg | ORAL_TABLET | Freq: Two times a day (BID) | ORAL | Status: DC
  Administered 2019-04-28: 6.25 mg via ORAL
  Filled 2019-04-28: qty 1

## 2019-04-28 NOTE — Progress Notes (Signed)
Advance care planning  Purpose of Encounter DKA, CHF  Parties in Attendance Patient and her niece Kristy [name] over the phone.  Family unable to be at bedside due to COVID restrictions  Patients Decisional capacity Alert and oriented  Discussed in detail regarding DKA, CHF.  Treatment plan , prognosis discussed.  All questions answered.  Discussed regarding patient needing to be compliant with medications and close follow-up with cardiology and primary care physician.  Discussed regarding home health services that are being set up.  Code status - FULL CODE  Time spent - 17 minutes

## 2019-04-28 NOTE — Discharge Instructions (Signed)
Please take all medications as directed without missing any doses.

## 2019-04-28 NOTE — TOC Transition Note (Signed)
Transition of Care [facility]) - CM/SW Discharge Note   Patient Details  Name: NORITA MEIGS MRN: [id number] Date of Birth: 1951-04-22  Transition of Care [facility]) CM/SW Contact:  Shade Flood, LCSW Phone Number: 04/28/2019, 1:26 PM   Clinical Narrative:     Pt stable for dc today. Spoke with pt's niece/POA re dc. She is in agreement with HH at dc and she states that they have arranged for family members to stay with pt around the clock. Will send pt home with life alert information at niece's request. Updated [place] [facility]'s [facility] and they will follow.   There are no other TOC needs for dc.   Final next level of care: [place] Barriers to Discharge: Barriers Resolved   Patient Goals and CMS Choice Patient states their goals for this hospitalization and ongoing recovery are:: to get back on my feet CMS Medicare.gov Compare Post Acute Care list provided to:: Patient Choice offered to / list presented to : Patient  Discharge Placement                       Discharge Plan and Services   Discharge Planning Services: CM Consult Post Acute Care Choice: Home Health                    HH Arranged: RN [facility] Agency: [facility] [place] Date [place]: 04/27/19 Time [place]: [phone] Representative spoke with at [place]: [place] ([place]) Interventions     Readmission Risk Interventions Readmission Risk Prevention Plan 04/28/2019 04/27/2019  Post Dischage Appt - Complete  Medication Screening - Complete  Transportation Screening - Complete  PCP or Specialist Appt within 5-7 Days Complete -  Home Care Screening Complete -  Medication Review (RN CM) Complete -  Some recent data might be hidden

## 2019-04-28 NOTE — Progress Notes (Signed)
Progress Note  Patient Name: Kristy [name] Date of Encounter: 04/28/2019  Primary Cardiologist: Ida Rogue, MD   Subjective   She feels well with no chest pain or shortness of breath.   Inpatient Medications    Scheduled Meds: . aspirin EC  81 mg Oral Daily  . atorvastatin  80 mg Oral q1800  . carvedilol  6.25 mg Oral BID WC  . clopidogrel  75 mg Oral Q breakfast  . famotidine  20 mg Oral Daily  . FLUoxetine  20 mg Oral Daily  . fondaparinux (ARIXTRA) injection  2.5 mg Subcutaneous Daily  . insulin aspart  0-15 Units Subcutaneous TID WC  . insulin aspart  0-5 Units Subcutaneous QHS  . insulin regular human CONCENTRATED  40 Units Subcutaneous Q supper  . insulin regular human CONCENTRATED  55 Units Subcutaneous Q breakfast  . ivabradine  5 mg Oral BID WC  . mouth rinse  15 mL Mouth Rinse BID  . sacubitril-valsartan  1 tablet Oral BID  . sodium chloride flush  3 mL Intravenous Q12H  . sodium chloride flush  3 mL Intravenous Q12H  . spironolactone  25 mg Oral Daily   Continuous Infusions: . sodium chloride    . sodium chloride     PRN Meds: sodium chloride, sodium chloride, acetaminophen **OR** acetaminophen, [DISCONTINUED] ondansetron **OR** ondansetron (ZOFRAN) IV, sodium chloride flush, sodium chloride flush, traZODone   Vital Signs    Vitals:   04/27/19 1658 04/27/19 1935 04/28/19 0402 04/28/19 0757  BP: 128/72 110/61 (!) 146/76 (!) 145/78  Pulse: 92 81 86 100  Resp:  20 20 (!) 21  Temp: 97.7 F (36.5 C) 98.3 F (36.8 C) 98.6 F (37 C) 97.8 F (36.6 C)  TempSrc: Oral Oral Oral Oral  SpO2: 99% 93% 94% 98%  Weight:   68.9 kg   Height:        Intake/Output Summary (Last 24 hours) at 04/28/2019 0824 Last data filed at 04/27/2019 2302 Gross per 24 hour  Intake -  Output 0 ml  Net 0 ml   Last 3 Weights 04/28/2019 04/27/2019 04/26/2019  Weight (lbs) 151 lb 12.8 oz 163 lb 3.2 oz 156 lb 11.2 oz  Weight (kg) 68.856 kg 74.027 kg 71.079 kg       Telemetry    Sinus rhythm- Personally Reviewed  ECG    No new tracing.  Physical Exam   Constitutional:  oriented to person, place, and time. No distress.  HENT:  Head: Normocephalic and atraumatic.  Eyes:  no discharge. No scleral icterus.  Neck: Normal range of motion. Neck supple. No JVD present.  Cardiovascular: Normal rate, regular rhythm, normal heart sounds and intact distal pulses. Exam reveals no gallop and no friction rub. No edema. No murmur heard. Pulmonary/Chest: Effort normal and breath sounds normal. No stridor. No respiratory distress.  no wheezes.  no rales.  no tenderness.  Abdominal: Soft.  no distension.  no tenderness.  Musculoskeletal: Normal range of motion.  no  tenderness or deformity.  Neurological:  normal muscle tone. Coordination normal. No atrophy Skin: Skin is warm and dry. No rash noted. not diaphoretic.  Psychiatric:  normal mood and affect. behavior is normal. Thought content normal.    Labs    High Sensitivity Troponin:   Recent Labs  Lab 04/20/19 0413 04/20/19 0850 04/21/19 1856  TROPONINIHS 5,057* 5,667* 2,950*      Cardiac EnzymesNo results for input(s): TROPONINI in the last 168 hours. No results for input(s): TROPIPOC  in the last 168 hours.   Chemistry Recent Labs  Lab 04/22/19 0700  04/24/19 0602 04/25/19 0543 04/26/19 0452  NA 138   < > 141 143 142  K 3.5   < > 4.4 2.9* 3.2*  CL 95*   < > 104 106 110  CO2 32   < > 28 28 26   GLUCOSE 350*   < > 129* 70 186*  BUN 19   < > 15 11 12   CREATININE 1.20*   < > 0.94 0.91 0.99  CALCIUM 7.5*   < > 8.6* 8.6* 8.9  PROT 5.2*  --   --  5.4*  --   ALBUMIN 2.6*  --   --  2.4*  --   AST 45*  --   --  29  --   ALT 21  --   --  19  --   ALKPHOS 74  --   --  123  --   BILITOT 1.3*  --   --  0.7  --   GFRNONAA 47*   < > >60 >60 59*  GFRAA 54*   < > >60 >60 >60  ANIONGAP 11   < > 9 9 6    < > = values in this interval not displayed.     Hematology Recent Labs  Lab 04/24/19 0602  04/25/19 0543 04/26/19 0452  WBC 13.2* 11.5* 11.6*  RBC 4.68 4.13 4.06  HGB 14.0 12.4 12.2  HCT 44.8 38.6 38.2  MCV 95.7 93.5 94.1  MCH 29.9 30.0 30.0  MCHC 31.3 32.1 31.9  RDW 13.6 13.5 13.7  PLT 134* 131* 166    BNPNo results for input(s): BNP, PROBNP in the last 168 hours.   DDimer No results for input(s): DDIMER in the last 168 hours.   Radiology    No results found.  Cardiac Studies   LHC 04/24/2019: 1. Stable appearance of coronary arteries since 08/2018 without new lesion to explain recent decline in LVEF or troponin elevation.  I suspect elevated troponin may be due to supply-demand mismatch and/or stress-induced cardiomyopathy in the setting of severe DKA. 2. Focal D1 stenosis remains ~80%.  There is mild to moderate, non-obstructive disease involving the LAD and LCx. 3. Mildly elevated left ventricular filling pressure. __________  2D echo 04/21/2019: 1. Severe hypokinesis of the left ventricular, mid-apical anterior wall and anteroseptal wall. 2. The left ventricle has moderately reduced systolic function, with an ejection fraction of 35-40%. The cavity size was normal. Left ventricular diastolic function could not be evaluated. 3. The right ventricle has normal systolic function. The cavity was normal. There is mildly increased right ventricular wall thickness. 4. Left atrial size was not well visualized. 5. The aortic valve is tricuspid. Mild thickening of the aortic valve. Aortic valve regurgitation was not assessed by color flow Doppler. Mild aortic annular calcification noted. 6. The mitral valve was not well visualized. There is mild mitral annular calcification present. 7. The aortic root is normal in size and structure. 8. The interatrial septum was not well visualized. __________  2D echo3/07/2019: 1. The left ventricle has low normal systolic function, with an ejection fraction of 50-55%. The cavity size was normal. There is moderate concentric  left ventricular hypertrophy. Left ventricular diastolic Doppler parameters are consistent with impaired relaxation. 2. The right ventricle has normal systolic function. The cavity was normal. There is no increase in right ventricular wall thickness. 3. Left atrial size was mildly dilated. 4. The mitral valve is grossly  normal. 5. The tricuspid valve is grossly normal. 6. The aortic valve is grossly normal. 7. Significant improvement in EF since last echo. __________  [facility] 08/03/2018: 1. Significant one-vessel coronary artery disease involving first diagonal with mild to moderate disease in the main vessels.  2. Left ventricular angiography was not performed. EF was severely reduced by echo. 3. Right heart catheterization showed normal filling pressures, normal pulmonary pressure and mildly reduced cardiac output at 3.91 with a cardiac index of 2.23.  Patient Profile     68 y.o. female with history of nonischemic cardiomyopathy with normalization of LVEF, nonobstructive coronary artery disease, hypertension, hyperlipidemia, uncontrolled diabetes mellitus,and Crohn's disease,whowas admitted with altered mental status felt to be likely metabolic encephalopathy in the setting of diabetic ketoacidosis andis being seen today for the evaluation ofelevated troponin.  Assessment & Plan    Elevated troponin: Demand ischemia in the setting of nonischemic cardiomyopathy, severe DKA.   -Catheterization with stable appearance of coronary arteries with no obstructive disease -Continue aspirin Plavix, statin  Acute on chronic systolic heart failure: History of nonischemic cardiomyopathy, ejection fraction 20 to 25% in November 2019 Improved up to 50 to 55% in March 2020 Ejection fraction 35 to 40% admission in the setting of DKA ----I increased carvedilol to 6.25 mg twice daily.  Continue Entresto 49/51 mg twice daily,  Ivabradine/corlanor and spironolactone 25 mg once daily.  spironolactone 25 mg daily ----I discussed with her the importance of taking her medications regularly.  No evidence of volume overload.  Sinus tachycardia: Continue carvedilol and ivabradine.  DKA:  medication noncompliance at home, poor insight  uncontrolled diabetes mellitus  Hospital admissions for DKA Followed at [facility] endocrinology, poor follow-up recently in the setting of COVID  Acute kidney injury: Secondary to DKA Renal function back to baseline   The patient can be discharged home from a cardiac standpoint.  She will need follow-up with [place] in 1 to 2 weeks and this will be arranged.   Total encounter time more than 25 minutes  Greater than 50% was spent in counseling and coordination of care with the patient     For questions or updates, please contact [place] Please consult [email or web address] for contact info under [facility].     Signed, [name], MD  04/28/2019, 8:24 AM

## 2019-04-28 NOTE — Care Management Important Message (Signed)
Important Message  Patient Details  Name: Kristy [name] MRN: [id number] Date of Birth: 1950-10-20   Medicare Important Message Given:  Yes     [name] 04/28/2019, 12:08 PM

## 2019-04-28 NOTE — Progress Notes (Signed)
Inpatient Diabetes Program Recommendations  AACE/ADA: New Consensus Statement on Inpatient Glycemic Control   Target Ranges:  Prepandial:   less than 140 mg/dL      Peak postprandial:   less than 180 mg/dL (1-2 hours)      Critically ill patients:  140 - 180 mg/dL   Results for Kristy [name], Kristy [name] (MRN [id number]) as of 04/28/2019 08:11  Ref. Range 04/27/2019 07:33 04/27/2019 11:45 04/27/2019 16:59 04/27/2019 20:24 04/28/2019 07:58  Glucose-Capillary Latest Ref Range: 70 - 99 mg/dL 233 (H) 313 (H) 240 (H) 198 (H) 216 (H)    Review of Glycemic Control  Diabetes history: DM2 Outpatient Diabetes medications: Humulin R U500 100 units q AM, 100 units with lunch and 80 units with dinner (last visit with Endocrinology was on 12/16/18) Current orders for Inpatient glycemic control: Humulin R U500 55 units QAM Humulin R U500 42 units QPM, Novolog 0-15 units TID with meals, Novolog 0-5 units QHS   Inpatient Diabetes Program Recommendations:    Insulin-Basal: Please consider increasing Humulin R U500 to 60 units QAM and continue evening dose as ordered.  Thanks, Barnie Alderman, RN, MSN, CDE Diabetes Coordinator Inpatient Diabetes Program [phone] (Team Pager from 8am to 5pm)

## 2019-04-28 NOTE — Progress Notes (Signed)
Discharge instructions explained to pt's POA, Katie  and daughter / verbalized an understanding/ iv removed/ transported off unit via wheelchair.

## 2019-04-28 NOTE — Discharge Summary (Signed)
[facility] NAME: Kena Limon    MR#:  [id number]  DATE OF BIRTH:  11-07-1950  DATE OF ADMISSION:  04/19/2019 ADMITTING PHYSICIAN: Loletha Grayer, MD  DATE OF DISCHARGE: 04/28/2019  PRIMARY CARE PHYSICIAN: Owens Loffler, MD   ADMISSION DIAGNOSIS:  Diabetic ketoacidosis without coma associated with other specified diabetes mellitus ([place]) [E13.10] Sepsis, due to unspecified organism, unspecified whether acute organ dysfunction present ([place]) [A41.9] Pneumonia [J18.9]  DISCHARGE DIAGNOSIS:  Active Problems:   Acute on chronic HFrEF (heart failure with reduced ejection fraction) (HCC)   DKA (diabetic ketoacidoses) (HCC)   Pneumonia   Sepsis ([place])   Elevated troponin   SECONDARY DIAGNOSIS:   Past Medical History:  Diagnosis Date  . Allergic rhinitis   . Allergy   . Cataract    mild   . Crohn's disease ([place]) 10/13/2013  . Diverticulosis   . GERD (gastroesophageal reflux disease)   . Gout   . HFrEF (heart failure with reduced ejection fraction) ([place])    a. 12/2008 Cath: EF 45% w/ inf HK; b. 07/2009 Echo: EF 50-55%; c. 08/2018 Echo: EF 20-25%.  Marland Kitchen Hyperlipidemia   . Hypertension   . IBS (irritable bowel syndrome)   . Left bundle branch block   . Neuromuscular disorder (HCC)    neuropathy  . NICM (nonischemic cardiomyopathy) ([place])    a. 12/2008 Cath: no significant dzs, EF 45% w/ inf HK->Med Rx; b. 07/2009 Echo: EF 50-55%; c. 08/2018 Echo: EF 20-25%, ant/antsept HK, mild MR, mildly dil LA, nl RV fx; d. 08/2018 Cath: D1 80, otw nonobs dzs->Med rx.  . Non-obstructive CAD (coronary artery disease)    a. 12/2008 Cath: no significant dzs, EF 45% w/ inf HK->Med Rx; b. 08/2018 Cath: LM nl, LAD min irregs, D1 80, LCX 81md, RCA min irregs->Med Rx.  . Osteoarthritis   . Poorly controlled Diabetes mellitus    a. 07/2018 A1c 13.1.  .Marland KitchenSymptomatic cholelithiasis      ADMITTING HISTORY  HISTORY OF PRESENT ILLNESS:  TDarinda [place]  is a 68y.o. female brought in with altered mental status.  Patient unable to give a history at this time.  Spoke with staff daughter on the phone.  Couple days ago the patient had a fall in the bathtub and was in the bathtub for a few hours.  Has had a few falls over few periods of time.  Yesterday she could not reach her on the phone and she went over there and found her on the side of the bed in fetal position.  She had vomited.  There were soda cans all over.  The patient has not taken her medications in a few days.  They brought her in and she was found to be in DKA.  She also had a fever.  COVID-19 testing was negative.  Chest x-ray negative.  Urine analysis was unable to be obtained in the ER.  Hospitalist services were contacted for further evaluation.   HOSPITAL COURSE:   * Uncontrolled type 2 diabetes- initially with DKA Admitted to ICU on insulin drip.  This resolved.  Patient was transitioned to medical floor.  Presently back on her home dose of insulin and blood sugars are improved. Discussed with her POA niece on day of discharge regarding checking to make sure patient takes her insulin doses.  * Acute on chronic systolic congestive heart failure- ECHO with moderately reduced LVEF with severe hypokinesis. Started on Coreg, Entresto,  spironolactone ,  ivabradine  -Cardiology  has followed the patient through hospital stay Status post right heart catheterization showing normal filling pressures with cardiac index 2.23, reduced cardiac output of 3.91. Patient ejection fraction initially was 35 to 40% on admission   * Hypokalemia -Replete and recheck  * Elevated troponin-likely demand ischemia versus stress-induced cardiomyopathy. -s/p cardiac cath 7/23 that showed stable appearance of the coronary arteries -Continue aspirin, plavix, and statin  * Thrombocytopenia- new this admission. Concern for HIT vs other process.  Platelets are better now up to 166 today. HIT antibody not  elevated -Peripheral smear unremarkable  * Acute encephalopathy- resolved.  Concerned that patient may have some underlying dementia. -MRI brain and EEG negative. -Patient has been seen by neurology, episodes of confusion on and off Outpatient follow-up with primary care physician  * Acute urinary retention -Urinary catheter placed by urology on admission -This has been removed and patient voiding well  * Sepsis- ruled out.  Blood and urine cultures are negative.  Off antibiotics.  * Hypertension- stable.  * Hyperlipidemia- stable -Continue home statin  Patient stable for discharge home with home health services.  Discussed in detail with her healthcare power of attorney/niece Evalina Field.  CONSULTS OBTAINED:  Treatment Team:  Abbie Sons, MD End, Harrell Gave, MD  DRUG ALLERGIES:   Allergies  Allergen Reactions  . Fish Oil Other (See Comments)    Gout  . Glimepiride     REACTION: hypoglycemia  . Guanfacine Hcl     REACTION: unspecified  . Rosiglitazone Other (See Comments)    CHF    DISCHARGE MEDICATIONS:   Allergies as of 04/28/2019      Reactions   Fish Oil Other (See Comments)   Gout   Glimepiride    REACTION: hypoglycemia   Guanfacine Hcl    REACTION: unspecified   Rosiglitazone Other (See Comments)   CHF      Medication List    TAKE these medications   acetaminophen 325 MG tablet Commonly known as: TYLENOL Take 325 mg by mouth daily as needed for moderate pain or headache.   Align 4 MG Caps Take 4 mg by mouth daily.   aspirin 81 MG EC tablet Take 1 tablet (81 mg total) by mouth daily. Start taking on: April 29, 2019   atorvastatin 80 MG tablet Commonly known as: LIPITOR Take 1 tablet (80 mg total) by mouth daily at 6 PM.   carvedilol 6.25 MG tablet Commonly known as: COREG Take 1 tablet (6.25 mg total) by mouth 2 (two) times daily.   chlorpheniramine 4 MG tablet Commonly known as: CHLOR-TRIMETON Take 4 mg by mouth daily as  needed for allergies.   clopidogrel 75 MG tablet Commonly known as: PLAVIX Take 1 tablet (75 mg total) by mouth daily with breakfast. Start taking on: April 29, 2019   FLUoxetine 20 MG capsule Commonly known as: PROZAC Take 1 capsule (20 mg total) by mouth daily.   HumuLIN R U-500 KwikPen 500 UNIT/ML kwikpen Generic drug: insulin regular human CONCENTRATED Inject 60-100 Units into the skin See admin instructions. Inject 100u under the skin at breakfast and lunch and inject 80u under the skin at dinner - inject 60u if eating a small / protein rich meal   Insulin Syringe-Needle U-100 31G X 5/16" 0.5 ML Misc Commonly known as: B-D INS SYRINGE 0.5CC/31GX5/16 USE AS DIRECTED THREE TIMES A DAY   ivabradine 5 MG Tabs tablet Commonly known as: Corlanor Take 1 tablet (5 mg total) by mouth 2 (  two) times daily with a meal.   metroNIDAZOLE 0.75 % gel Commonly known as: METROGEL Apply 1 application topically daily as needed (acne).   NONFORMULARY OR COMPOUNDED ITEM See pharmacy note What changed:   how much to take  how to take this  when to take this  reasons to take this  additional instructions   ONE TOUCH ULTRA TEST test strip Generic drug: glucose blood USE AS PER PACKAGE INSTRUCTIONS CHECK SUGARS UP TO-FOUR TIMES A DAY OR AS PRESCRIBED   sacubitril-valsartan 49-51 MG Commonly known as: Entresto Take 1 tablet (49/51 mg) by mouth 2 times daily starting on Sunday 09/15/18   spironolactone 25 MG tablet Commonly known as: ALDACTONE Take 1 tablet (25 mg total) by mouth daily. Start taking on: April 29, 2019       Today   VITAL SIGNS:  Blood pressure (!) 145/78, pulse 100, temperature 97.8 F (36.6 C), temperature source Oral, resp. rate (!) 21, height 5' 5"  (1.651 m), weight 68.9 kg, SpO2 98 %.  I/O:    Intake/Output Summary (Last 24 hours) at 04/28/2019 1505 Last data filed at 04/28/2019 1035 Gross per 24 hour  Intake 120 ml  Output 0 ml  Net 120 ml     PHYSICAL EXAMINATION:  Physical Exam  GENERAL:  68 y.o.-year-old patient lying in the bed with no acute distress.  LUNGS: Normal breath sounds bilaterally, no wheezing, rales,rhonchi or crepitation. No use of accessory muscles of respiration.  CARDIOVASCULAR: S1, S2 normal. No murmurs, rubs, or gallops.  ABDOMEN: Soft, non-tender, non-distended. Bowel sounds present. No organomegaly or mass.  NEUROLOGIC: Moves all 4 extremities. PSYCHIATRIC: The patient is alert and awake SKIN: No obvious rash, lesion, or ulcer.   DATA REVIEW:   CBC Recent Labs  Lab 04/26/19 0452  WBC 11.6*  HGB 12.2  HCT 38.2  PLT 166    Chemistries  Recent Labs  Lab 04/25/19 0543 04/26/19 0452  NA 143 142  K 2.9* 3.2*  CL 106 110  CO2 28 26  GLUCOSE 70 186*  BUN 11 12  CREATININE 0.91 0.99  CALCIUM 8.6* 8.9  AST 29  --   ALT 19  --   ALKPHOS 123  --   BILITOT 0.7  --     Cardiac Enzymes No results for input(s): TROPONINI in the last 168 hours.  Microbiology Results  Results for orders placed or performed during the hospital encounter of 04/19/19  Blood culture (routine x 2)     Status: None (Preliminary result)   Collection Time: 04/19/19  6:53 PM   Specimen: BLOOD  Result Value Ref Range Status   Specimen Description BLOOD LAC  Final   Special Requests   Final    BOTTLES DRAWN AEROBIC AND ANAEROBIC Blood Culture adequate volume   Culture   Final    NO GROWTH 4 DAYS Performed at [facility], [address] [place], [place] [address]    Report Status PENDING  Incomplete  Blood culture (routine x 2)     Status: None (Preliminary result)   Collection Time: 04/19/19  7:50 PM   Specimen: BLOOD  Result Value Ref Range Status   Specimen Description BLOOD LEFT ANTECUBITAL  Final   Special Requests   Final    BOTTLES DRAWN AEROBIC AND ANAEROBIC Blood Culture adequate volume   Culture   Final    NO GROWTH 4 DAYS Performed at [facility], [address]  [place],  [address]    Report  Status PENDING  Incomplete  SARS Coronavirus 2 (CEPHEID - Performed in [place] hospital lab), Hosp Order     Status: None   Collection Time: 04/19/19  8:37 PM   Specimen: Nasopharyngeal Swab  Result Value Ref Range Status   SARS Coronavirus 2 NEGATIVE NEGATIVE Final    Comment: (NOTE) If result is NEGATIVE SARS-CoV-2 target nucleic acids are NOT DETECTED. The SARS-CoV-2 RNA is generally detectable in upper and lower  respiratory specimens during the acute phase of infection. The lowest  concentration of SARS-CoV-2 viral copies this assay can detect is 250  copies / mL. A negative result does not preclude SARS-CoV-2 infection  and should not be used as the sole basis for treatment or other  patient management decisions.  A negative result may occur with  improper specimen collection / handling, submission of specimen other  than nasopharyngeal swab, presence of viral mutation(s) within the  areas targeted by this assay, and inadequate number of viral copies  (<250 copies / mL). A negative result must be combined with clinical  observations, patient history, and epidemiological information. If result is POSITIVE SARS-CoV-2 target nucleic acids are DETECTED. The SARS-CoV-2 RNA is generally detectable in upper and lower  respiratory specimens dur ing the acute phase of infection.  Positive  results are indicative of active infection with SARS-CoV-2.  Clinical  correlation with patient history and other diagnostic information is  necessary to determine patient infection status.  Positive results do  not rule out bacterial infection or co-infection with other viruses. If result is PRESUMPTIVE POSTIVE SARS-CoV-2 nucleic acids MAY BE PRESENT.   A presumptive positive result was obtained on the submitted specimen  and confirmed on repeat testing.  While 2019 novel coronavirus  (SARS-CoV-2) nucleic acids may be present in the submitted sample  additional  confirmatory testing may be necessary for epidemiological  and / or clinical management purposes  to differentiate between  SARS-CoV-2 and other Sarbecovirus currently known to infect humans.  If clinically indicated additional testing with an alternate test  methodology [phone]) is advised. The SARS-CoV-2 RNA is generally  detectable in upper and lower respiratory sp ecimens during the acute  phase of infection. The expected result is Negative. Fact Sheet for Patients:  StrictlyIdeas.no Fact Sheet for Healthcare Providers: BankingDealers.co.za This test is not yet approved or cleared by the [place] FDA and has been authorized for detection and/or diagnosis of SARS-CoV-2 by FDA under an Emergency Use Authorization (EUA).  This EUA will remain in effect (meaning this test can be used) for the duration of the COVID-19 declaration under Section 564(b)(1) of the Act, 21 U.S.C. section 360bbb-3(b)(1), unless the authorization is terminated or revoked sooner. Performed at [facility], [address] [place], [place] [address]   MRSA PCR Screening     Status: None   Collection Time: 04/20/19  1:16 AM   Specimen: Nasopharyngeal  Result Value Ref Range Status   MRSA by PCR NEGATIVE NEGATIVE Final    Comment:        The GeneXpert MRSA Assay (FDA approved for NASAL specimens only), is one component of a comprehensive MRSA colonization surveillance program. It is not intended to diagnose MRSA infection nor to guide or monitor treatment for MRSA infections. Performed at [facility], [address], [place], [place] [address]   Urine Culture     Status: None   Collection Time: 04/20/19  1:56 AM   Specimen: Urine, Random  Result Value Ref Range Status  Specimen Description   Final    URINE, RANDOM Performed at [facility], [address], [place], [place] [address]    Special Requests   Final     NONE Performed at [facility], [address], [place], [place] [address]    Culture   Final    NO GROWTH Performed at [facility], [place] [address], [place], [place] [address]    Report Status 04/21/2019 FINAL  Final    RADIOLOGY:  No results found.  Follow up with PCP in 1 week.  Management plans discussed with the patient, family and they are in agreement.  CODE STATUS:     Code Status Orders  (From admission, onward)         Start     Ordered   04/19/19 2216  Full code  Continuous     04/19/19 2216        Code Status History    Date Active Date Inactive Code Status Order ID Comments User Context   08/26/2018 1151 08/26/2018 1736 Full Code [id number]  Wellington Hampshire, MD Inpatient   05/14/2018 0401 05/15/2018 1421 Full Code [id number]  Lance Coon, MD Inpatient   Advance Care Planning Activity      TOTAL TIME TAKING CARE OF THIS PATIENT ON DAY OF DISCHARGE: more than 30 minutes.   Leia Alf Anguel Delapena M.D on 04/28/2019 at 3:05 PM  Between 7am to 6pm - Pager - [phone]  After 6pm go to [email or web address] - password [email or web address] [facility]  Office  [phone]  CC: Primary care physician; Owens Loffler, MD  Note: This dictation was prepared with Dragon dictation along with smaller phrase technology. Any transcriptional errors that result from this process are unintentional.

## 2019-04-29 ENCOUNTER — Telehealth: Payer: Self-pay | Admitting: Cardiovascular Disease

## 2019-04-29 NOTE — Telephone Encounter (Signed)
-----   Message from Wellington Hampshire, MD sent at 04/28/2019  8:27 AM EDT ----- Possible discharge from [facility] today.  TCM follow-up needed in 1 to 2 weeks.  She is a patient of Dr. Candis Musa.

## 2019-04-29 NOTE — Telephone Encounter (Signed)
LMOV to schedule

## 2019-04-30 NOTE — Telephone Encounter (Signed)
TCM....  Patient is being discharged 7/27  They saw Dr. Rockey Situ  They are scheduled to see Christell Faith on 8/7  They were seen for Elevated troponin and heart failure   They need to be seen within 1-2 weeks  Pt not placed on  wait list   Please call

## 2019-04-30 NOTE — Telephone Encounter (Signed)
CSW received a phone call from patient's niece Rose Fillers, [phone], who stated that she has been unable to get a hold of someone from [facility] which is the home health agency they chose.  CSW attempted to contact [place] at [facility] [phone], left a message on voice mail for [place] to call patient's family.  Jones Broom. [place], MSW, LCSW [phone]  04/30/2019 4:18 PM

## 2019-04-30 NOTE — Telephone Encounter (Signed)
TCM- 1st attempt.  Attempted to call the patient. No answer- I left a message to please call back.

## 2019-05-01 LAB — CULTURE, BLOOD (ROUTINE X 2)
Culture: NO GROWTH
Culture: NO GROWTH
Special Requests: ADEQUATE
Special Requests: ADEQUATE

## 2019-05-01 NOTE — Telephone Encounter (Signed)
Patient contacted regarding discharge from [facility] on 04/28/19.  Patient understands to follow up with provider Christell Faith, PA on 05/09/19 at 0830 at [place]. Patient understands discharge instructions? yes Patient understands medications and regiment? yes Patient understands to bring all medications to this visit? yes

## 2019-05-02 ENCOUNTER — Other Ambulatory Visit: Payer: Self-pay | Admitting: Cardiovascular Disease

## 2019-05-05 ENCOUNTER — Ambulatory Visit: Payer: Medicare HMO | Admitting: Family Medicine

## 2019-05-06 NOTE — Progress Notes (Signed)
Cardiology Office Note    Date:  05/09/2019   ID:  Edith, Groleau 1950/12/05, MRN [id number]  PCP:  Owens Loffler, MD  Cardiologist:  Ida Rogue, MD  Electrophysiologist:  None   Chief Complaint: Hospital follow up  History of Present Illness:   Kristy [name] is a 68 y.o. female with history of nonobstructive CAD as outlined below, HFrEF secondary to NICM, uncontrolled diabetes with prior DKA with poor medication compliance, Crohn's disease, hypertension, hyperlipidemia, LBBB, prior tobacco abuse, gout, GERD who presents for hospital follow-up after recent admission to [facility] from 7/18 through 7/27 for DKA with acute on chronic systolic CHF.  Patient was initially diagnosed with cardiomyopathy in 2010 with an EF of 45% at that time.  Diagnostic cath showed minimal, nonobstructive CAD and was medically managed.  Patient had recovery of LV systolic function later in 2010 with echo showing an EF of 50 to 55%.  Over the years, she has struggled with controlling her diabetes and has required admission for DKA with AKI in the past requiring intermittent cessation of heart failure therapy.  In 08/2018 she developed worsening dyspnea with repeat echo showing an EF of 20 to 25%.  She underwent diagnostic cath which again showed predominantly nonobstructive CAD with more significant stenosis in a small diagonal branch.  Continued medical therapy was recommended.  Repeat echo in 12/2018 showed subsequent improvement in LV systolic function with an EF of 50 to 55%, moderate concentric LVH, diastolic dysfunction, normal RV systolic function, normal RV cavity size, mildly dilated left atrium, no significant valvular abnormality.  More recently, the patient was admitted in 04/2019 with altered mental status with difficulty with word finding preceded by a fall in the bathtub.  She was found on the floor by a family member.  She was noted to be febrile and in DKA upon her admission.  High-sensitivity  troponin was elevated, peaking at 5667.  Echo on 04/21/2019 showed an EF of 35 to 40%, severe hypokinesis of the mid apical anterior wall and anteroseptal wall, normal RV systolic function, normal RV cavity size, trileaflet aortic valve with mild aortic annular calcification, mild mitral annular calcification, normal size and structure aortic root.  Imaging of the head/brain was unrevealing for stroke.  Prior to discharge, the patient underwent diagnostic cath which showed stable appearance of coronary artery since 08/2018 without new lesion to explain recent decline in LVEF or elevated troponin.  It was felt the elevated troponin may be due to supply demand ischemia and or stress-induced cardiomyopathy in the setting of severe DKA.  There was focal D1 stenosis remaining at approximately 80% with mild to moderate nonobstructive disease involving the LAD and LCx.  Mildly elevated left ventricular filling pressure was noted.  Continued optimization of evidence-based heart failure therapy was recommended.  Discharge weight documented at 68.9 kg.  Discharge cardiac medications included aspirin 81 mg, Lipitor 80 mg, Coreg 6.25 mg twice daily, Plavix 75 mg, ivabradine 5 mg twice daily, Entresto 49/51 mg twice daily, spironolactone 25 mg, along with her noncardiac medications.  She comes in doing very well from a cardiac perspective.  She denies any chest pain, shortness of breath, palpitations, dizziness, presyncope, syncope.  No lower extremity swelling, abdominal surgery, orthopnea, PND, early satiety.  She continues to struggle with poorly controlled blood sugars reporting a glucose this morning of 563.  She reports compliance with all medications.  Weight has been stable.  She is not checking blood pressure at home.  She  has not had any falls since her hospital discharge.  She has noted some sporadic blood-tinged mucus in her stool that seems to be improving.  She denies any melena.  She has not yet followed up with  [facility] endocrinology.   Labs: 04/2019 - potassium 3.2, serum creatinine 0.99, glucose 303, WBC 11.6, Hgb 12.2, PLT 166, albumin 2.4, AST/ALT normal, HIT antibody normal, amylase 63, total cholesterol 170, triglycerides 209, HDL 25, LDL 103, magnesium 1.8, A1c greater than 15.5  Past Medical History:  Diagnosis Date   Allergic rhinitis    Allergy    Cataract    mild    Crohn's disease ([place]) 10/13/2013   Diverticulosis    GERD (gastroesophageal reflux disease)    Gout    HFrEF (heart failure with reduced ejection fraction) ([place])    a. 12/2008 Cath: EF 45% w/ inf HK; b. 07/2009 Echo: EF 50-55%; c. 08/2018 Echo: EF 20-25%.   Hyperlipidemia    Hypertension    IBS (irritable bowel syndrome)    Left bundle branch block    Neuromuscular disorder (HCC)    neuropathy   NICM (nonischemic cardiomyopathy) ([place])    a. 12/2008 Cath: no significant dzs, EF 45% w/ inf HK->Med Rx; b. 07/2009 Echo: EF 50-55%; c. 08/2018 Echo: EF 20-25%, ant/antsept HK, mild MR, mildly dil LA, nl RV fx; d. 08/2018 Cath: D1 80, otw nonobs dzs->Med rx.   Non-obstructive CAD (coronary artery disease)    a. 12/2008 Cath: no significant dzs, EF 45% w/ inf HK->Med Rx; b. 08/2018 Cath: LM nl, LAD min irregs, D1 80, LCX 58md, RCA min irregs->Med Rx.   Osteoarthritis    Poorly controlled Diabetes mellitus    a. 07/2018 A1c 13.1.   Symptomatic cholelithiasis     Past Surgical History:  Procedure Laterality Date   COLONOSCOPY     DILATION AND CURETTAGE OF UTERUS     LEFT HEART CATH AND CORONARY ANGIOGRAPHY N/A 04/24/2019   Procedure: LEFT HEART CATH AND CORONARY ANGIOGRAPHY;  Surgeon: ENelva Bush MD;  [facility];  Service: Cardiovascular;  Laterality: N/A;   RIGHT/LEFT HEART CATH AND CORONARY ANGIOGRAPHY N/A 08/26/2018   Procedure: RIGHT/LEFT HEART CATH AND CORONARY ANGIOGRAPHY;  Surgeon: AWellington Hampshire MD;  [facility];  Service: Cardiovascular;  Laterality:  N/A;   SHOULDER SURGERY     15 + yrs ago    SKIN SURGERY     nose    VAGINAL HYSTERECTOMY      Current Medications: Current Meds  Medication Sig   acetaminophen (TYLENOL) 325 MG tablet Take 325 mg by mouth daily as needed for moderate pain or headache.   aspirin EC 81 MG EC tablet Take 1 tablet (81 mg total) by mouth daily.   atorvastatin (LIPITOR) 80 MG tablet Take 1 tablet (80 mg total) by mouth daily at 6 PM.   carvedilol (COREG) 6.25 MG tablet Take 1 tablet (6.25 mg total) by mouth 2 (two) times daily.   chlorpheniramine (CHLOR-TRIMETON) 4 MG tablet Take 4 mg by mouth daily as needed for allergies.   clopidogrel (PLAVIX) 75 MG tablet Take 1 tablet (75 mg total) by mouth daily with breakfast.   ENTRESTO 49-51 MG TAKE ONE TABLET TWICE DAILY STARTING SUNDAY 09/15/18   FLUoxetine (PROZAC) 20 MG capsule Take 1 capsule (20 mg total) by mouth daily.   insulin regular human CONCENTRATED (HUMULIN R U-500 KWIKPEN) 500 UNIT/ML kwikpen Inject 60-100 Units into the skin See admin instructions. Inject 100u  under the skin at breakfast and lunch and inject 80u under the skin at dinner - inject 60u if eating a small / protein rich meal   Insulin Syringe-Needle U-100 (B-D INS SYRINGE 0.5CC/31GX5/16) 31G X 5/16" 0.5 ML MISC USE AS DIRECTED THREE TIMES A DAY   ivabradine (CORLANOR) 5 MG TABS tablet Take 1 tablet (5 mg total) by mouth 2 (two) times daily with a meal.   metroNIDAZOLE (METROGEL) 0.75 % gel Apply 1 application topically daily as needed (acne).   NONFORMULARY OR COMPOUNDED ITEM See pharmacy note (Patient taking differently: Apply 1 application topically at bedtime as needed (neuropathy). Peripheral Neuropathy Cream-  Bupivacaine 1%, Doxepin 3%, Gabapentin 6%, Pentoxifylline 3%, Topiramate 1%  Apply 1-2 grams to affected area 3-4 times daily  Qty. 120 gm)   ONE TOUCH ULTRA TEST test strip USE AS PER PACKAGE INSTRUCTIONS CHECK SUGARS UP TO-FOUR TIMES A DAY OR AS PRESCRIBED    Probiotic Product (ALIGN) 4 MG CAPS Take 4 mg by mouth daily.    spironolactone (ALDACTONE) 25 MG tablet Take 1 tablet (25 mg total) by mouth daily.    Allergies:   Fish oil, Glimepiride, Guanfacine hcl, and Rosiglitazone   Social History   Socioeconomic History   Marital status: Widowed    Spouse name: Not on file   Number of children: Not on file   Years of education: Not on file   Highest education level: Not on file  Occupational History   Not on file  Social Needs   Financial resource strain: Not on file   Food insecurity    Worry: Not on file    Inability: Not on file   Transportation needs    Medical: Not on file    Non-medical: Not on file  Tobacco Use   Smoking status: Former Smoker    Packs/day: 0.75    Years: 30.00    Pack years: 22.50    Types: Cigarettes    Quit date: 06/06/1997    Years since quitting: 21.9   Smokeless tobacco: Never Used  Substance and Sexual Activity   Alcohol use: No   Drug use: No   Sexual activity: Not on file  Lifestyle   Physical activity    Days per week: Not on file    Minutes per session: Not on file   Stress: Not on file  Relationships   Social connections    Talks on phone: Not on file    Gets together: Not on file    Attends religious service: Not on file    Active member of club or organization: Not on file    Attends meetings of clubs or organizations: Not on file    Relationship status: Not on file  Other Topics Concern   Not on file  Social History Narrative   Not on file     Family History:  The patient's family history includes Hypertension in her father. There is no history of Colon cancer, Colon polyps, Rectal cancer, or Stomach cancer.  ROS:   Review of Systems  Constitutional: Negative for chills, diaphoresis, fever, malaise/fatigue and weight loss.  HENT: Negative for congestion.   Eyes: Negative for discharge and redness.  Respiratory: Negative for cough, hemoptysis, sputum  production, shortness of breath and wheezing.   Cardiovascular: Negative for chest pain, palpitations, orthopnea, claudication, leg swelling and PND.  Gastrointestinal: Negative for abdominal pain, blood in stool, heartburn, melena, nausea and vomiting.  Genitourinary: Negative for hematuria.  Musculoskeletal: Negative for falls and  myalgias.  Skin: Negative for rash.  Neurological: Negative for dizziness, tingling, tremors, sensory change, speech change, focal weakness, loss of consciousness and weakness.  Endo/Heme/Allergies: Does not bruise/bleed easily.       Labile blood sugars  Psychiatric/Behavioral: Negative for substance abuse. The patient is not nervous/anxious.   All other systems reviewed and are negative.    EKGs/Labs/Other Studies Reviewed:    Studies reviewed were summarized above. The additional studies were reviewed today:  [place] 04/24/2019: Conclusions: 1. Stable appearance of coronary arteries since 08/2018 without new lesion to explain recent decline in LVEF or troponin elevation.  I suspect elevated troponin may be due to supply-demand mismatch and/or stress-induced cardiomyopathy in the setting of severe DKA. 2. Focal D1 stenosis remains ~80%.  There is mild to moderate, non-obstructive disease involving the LAD and LCx. 3. Mildly elevated left ventricular filling pressure.  Recommendations: 1. Continue optimization of evidence-based heart failure.  Will continue carvedilol 3.125 mg BID and restart Entresto today. 2. Maintain net even fluid balance; could consider started gentle diuresis tomorrow as renal function allows. 3. Dual antiplatelet therapy with aspirin and clopidogrel for 12 months. 4. Aggressive secondary prevention. __________  2D Echo 04/21/2019: 1. Severe hypokinesis of the left ventricular, mid-apical anterior wall and anteroseptal wall.  2. The left ventricle has moderately reduced systolic function, with an ejection fraction of 35-40%. The cavity  size was normal. Left ventricular diastolic function could not be evaluated.  3. The right ventricle has normal systolic function. The cavity was normal. There is mildly increased right ventricular wall thickness.  4. Left atrial size was not well visualized.  5. The aortic valve is tricuspid. Mild thickening of the aortic valve. Aortic valve regurgitation was not assessed by color flow Doppler. Mild aortic annular calcification noted.  6. The mitral valve was not well visualized. There is mild mitral annular calcification present.  7. The aortic root is normal in size and structure.  8. The interatrial septum was not well visualized.   EKG:  EKG is ordered today.  The EKG ordered today demonstrates NSR, 84 bpm, LBBB (known)  Recent Labs: 04/19/2019: TSH 0.586 04/21/2019: Magnesium 1.8 04/25/2019: ALT 19 04/26/2019: BUN 12; Creatinine, Ser 0.99; Hemoglobin 12.2; Platelets 166; Potassium 3.2; Sodium 142  Recent Lipid Panel    Component Value Date/Time   CHOL 170 04/22/2019 0700   TRIG 209 (H) 04/22/2019 0700   HDL 25 (L) 04/22/2019 0700   CHOLHDL 6.8 04/22/2019 0700   VLDL 42 (H) 04/22/2019 0700   LDLCALC 103 (H) 04/22/2019 0700   LDLDIRECT 192.0 07/11/2018 0846    PHYSICAL EXAM:    VS:  BP 130/60 (BP Location: Left Arm, Patient Position: Sitting, Cuff Size: Normal)    Pulse 88    Ht 5' 3"  (1.6 m)    Wt 151 lb 1.9 oz (68.5 kg)    SpO2 98%    BMI 26.77 kg/m   BMI: Body mass index is 26.77 kg/m.  Physical Exam  Constitutional: She is oriented to person, place, and time. She appears well-developed and well-nourished.  HENT:  Head: Normocephalic and atraumatic.  Eyes: Right eye exhibits no discharge. Left eye exhibits no discharge.  Neck: Normal range of motion. No JVD present.  Cardiovascular: Normal rate, regular rhythm, S1 normal, S2 normal and normal heart sounds. Exam reveals no distant heart sounds, no friction rub, no midsystolic click and no opening snap.  No murmur  heard. Pulses:      Posterior tibial pulses are 2+  on the right side and 2+ on the left side.  Pulmonary/Chest: Effort normal and breath sounds normal. No respiratory distress. She has no decreased breath sounds. She has no wheezes. She has no rales. She exhibits no tenderness.  Abdominal: Soft. She exhibits no distension. There is no abdominal tenderness.  Musculoskeletal:        General: No edema.  Neurological: She is alert and oriented to person, place, and time.  Skin: Skin is warm and dry. No cyanosis. Nails show no clubbing.  Psychiatric: She has a normal mood and affect. Her speech is normal and behavior is normal. Judgment and thought content normal.    Wt Readings from Last 3 Encounters:  05/09/19 151 lb 1.9 oz (68.5 kg)  04/28/19 151 lb 12.8 oz (68.9 kg)  02/12/19 154 lb (69.9 kg)     ASSESSMENT & PLAN:   1. HFrEF secondary to NICM: She is doing well and appears euvolemic and well compensated.  Compliant and tolerating all medications.  Titrate Coreg to 12.5 mg twice daily.  Otherwise, she will remain on current doses of ivabradine, Entresto, and spironolactone.  When she is seen in follow-up in 1 month would recommend to continue to optimize evidence-based heart failure therapy as vital signs allow.  Weight stable.  She will need follow-up echo in approximately 3 months time to reevaluate LV systolic function after resuming optimal evidence-based heart failure therapy.  In the past, she has demonstrated subsequent normalization of her EF with compliant medical therapy.  However, if she continues to have an EF of less than 35% despite optimization of medical therapy with repeat echo will recommend referral to EP for consideration of underlying CRT given the patient's underlying left bundle branch block.  CHF education discussed.  2. Nonobstructive CAD: She is doing well without any symptoms of angina.  Recent cardiac cath from 04/24/2019 demonstrated stable appearance of known  stenosis of a small D1 and otherwise nonobstructive disease as outlined above.  Continue risk factor modification and medical management with aspirin and Plavix without interruption for the next 12 months along with Lipitor an Coreg.  Check CBC given dual antiplatelet therapy.  3. Poorly controlled insulin-dependent diabetes with recent DKA: Followed by [facility] endocrinology.  Follow-up as directed.  4. Hypertension: Blood pressure reasonably controlled.  Continue Entresto, Coreg, and spironolactone.  5. Hyperlipidemia: Most recent lipid panel showed a triglyceride level of 209 and LDL of 103.  Goal LDL less than 70.  She was not compliant with Lipitor when this lipid panel was checked.  She does indicate compliance with this medication at this time.  She is wondering if she is having some myalgias associated with it.  She would prefer to continue to remain on Lipitor for the next 4 weeks and if the myalgias worsen she is agreeable to undergo a trial of Crestor.  Recommend follow-up lipid and liver function in approximately 8 weeks.  6. Hypokalemia: On spironolactone.  Check BMP.  Replete to goal of 4.0.  Disposition: F/u with Dr. Rockey Situ or an APP in 1 month to optimize evidence-based heart failure therapy.   Medication Adjustments/Labs and Tests Ordered: Current medicines are reviewed at length with the patient today.  Concerns regarding medicines are outlined above. Medication changes, Labs and Tests ordered today are summarized above and listed in the Patient Instructions accessible in Encounters.   Signed, Christell Faith, PA-C 05/09/2019 8:36 AM     [address] [place],  [id number] [phone]  [phone] °

## 2019-05-07 ENCOUNTER — Ambulatory Visit: Payer: Medicare HMO | Admitting: Family

## 2019-05-09 ENCOUNTER — Telehealth: Payer: Self-pay | Admitting: *Deleted

## 2019-05-09 ENCOUNTER — Other Ambulatory Visit: Payer: Self-pay

## 2019-05-09 ENCOUNTER — Ambulatory Visit (INDEPENDENT_AMBULATORY_CARE_PROVIDER_SITE_OTHER): Payer: Medicare HMO | Admitting: Physician Assistant

## 2019-05-09 ENCOUNTER — Encounter: Payer: Self-pay | Admitting: Physician Assistant

## 2019-05-09 VITALS — BP 130/60 | HR 88 | Ht 63.0 in | Wt 151.1 lb

## 2019-05-09 DIAGNOSIS — E785 Hyperlipidemia, unspecified: Secondary | ICD-10-CM | POA: Diagnosis not present

## 2019-05-09 DIAGNOSIS — I5022 Chronic systolic (congestive) heart failure: Secondary | ICD-10-CM

## 2019-05-09 DIAGNOSIS — I428 Other cardiomyopathies: Secondary | ICD-10-CM | POA: Diagnosis not present

## 2019-05-09 DIAGNOSIS — E876 Hypokalemia: Secondary | ICD-10-CM | POA: Diagnosis not present

## 2019-05-09 DIAGNOSIS — E1165 Type 2 diabetes mellitus with hyperglycemia: Secondary | ICD-10-CM

## 2019-05-09 DIAGNOSIS — Z794 Long term (current) use of insulin: Secondary | ICD-10-CM | POA: Diagnosis not present

## 2019-05-09 DIAGNOSIS — I2510 Atherosclerotic heart disease of native coronary artery without angina pectoris: Secondary | ICD-10-CM | POA: Diagnosis not present

## 2019-05-09 DIAGNOSIS — I10 Essential (primary) hypertension: Secondary | ICD-10-CM

## 2019-05-09 MED ORDER — CARVEDILOL 12.5 MG PO TABS: 12.5000 mg | ORAL_TABLET | Freq: Two times a day (BID) | ORAL | 3 refills | Status: DC

## 2019-05-09 NOTE — Telephone Encounter (Signed)
Received Humulin R U-500 for [employer].  Qty 18 UOM EA.  [place] [phone].  Left message for B[place]en that insulin is here at the office and can be picked up at her convenience.

## 2019-05-09 NOTE — Patient Instructions (Addendum)
Medication Instructions:  - Your physician has recommended you make the following change in your medication:   1) Increase coreg (carvedilol) to 12.5 mg- take 1 tablet by mouth twice daily  If you need a refill on your cardiac medications before your next appointment, please call your pharmacy.   Lab work: - Your physician recommends that you have lab work today: BMP/ [place]- 1st desk on the right to check in   If you have labs (blood work) drawn today and your tests are completely normal, you will receive your results only by: Marland Kitchen MyChart Message (if you have MyChart) OR . A paper copy in the mail If you have any lab test that is abnormal or we need to change your treatment, we will call you to review the results.  Testing/Procedures: - Your physician has requested that you have an echocardiogram- 3 months. Echocardiography is a painless test that uses sound waves to create images of your heart. It provides your doctor with information about the size and shape of your heart and how well your heart's chambers and valves are working. This procedure takes approximately one hour. There are no restrictions for this procedure.  Follow-Up: At [facility], you and your health needs are our priority.  As part of our continuing mission to provide you with exceptional heart care, we have created designated Provider Care Teams.  These Care Teams include your primary Cardiologist (physician) and Advanced Practice Providers (APPs -  Physician Assistants and Nurse Practitioners) who all work together to provide you with the care you need, when you need it. . 1 month with Dr. Dayton Martes, PA  Any Other Special Instructions Will Be Listed Below (If Applicable). - N/A

## 2019-05-12 ENCOUNTER — Other Ambulatory Visit: Payer: Self-pay | Admitting: Cardiovascular Disease

## 2019-05-12 ENCOUNTER — Ambulatory Visit: Payer: Medicare HMO | Admitting: Family Medicine

## 2019-05-12 ENCOUNTER — Telehealth: Payer: Self-pay

## 2019-05-12 NOTE — Telephone Encounter (Signed)
Spoke with patient, she is not feeling well with vertigo today and would like to cancel her hospital f/u.  Appears that we have been trying to reach her to reschedule her anyway due to Dr. Lorelei Pont being out of the office this pm.   Patient's appointment rescheduled until next Monday 8/17 and she will pick up her insulin then.  FYI to Dr. Lorelei Pont and [name] CMA.

## 2019-05-12 NOTE — Telephone Encounter (Signed)
Reasonably OK.  Normally she is well tuned and thinks well with good understanding.

## 2019-05-14 DIAGNOSIS — R69 Illness, unspecified: Secondary | ICD-10-CM | POA: Diagnosis not present

## 2019-05-19 ENCOUNTER — Telehealth: Payer: Self-pay | Admitting: Cardiovascular Disease

## 2019-05-19 ENCOUNTER — Encounter: Payer: Self-pay | Admitting: Family Medicine

## 2019-05-19 ENCOUNTER — Ambulatory Visit (INDEPENDENT_AMBULATORY_CARE_PROVIDER_SITE_OTHER): Payer: Medicare HMO | Admitting: Family Medicine

## 2019-05-19 ENCOUNTER — Other Ambulatory Visit: Payer: Self-pay

## 2019-05-19 VITALS — BP 130/66 | HR 66 | Temp 98.2°F | Ht 62.75 in | Wt 156.0 lb

## 2019-05-19 DIAGNOSIS — E1122 Type 2 diabetes mellitus with diabetic chronic kidney disease: Secondary | ICD-10-CM

## 2019-05-19 DIAGNOSIS — R778 Other specified abnormalities of plasma proteins: Secondary | ICD-10-CM

## 2019-05-19 DIAGNOSIS — E876 Hypokalemia: Secondary | ICD-10-CM | POA: Diagnosis not present

## 2019-05-19 DIAGNOSIS — N183 Chronic kidney disease, stage 3 (moderate): Secondary | ICD-10-CM

## 2019-05-19 DIAGNOSIS — I5023 Acute on chronic systolic (congestive) heart failure: Secondary | ICD-10-CM | POA: Diagnosis not present

## 2019-05-19 DIAGNOSIS — E1165 Type 2 diabetes mellitus with hyperglycemia: Secondary | ICD-10-CM

## 2019-05-19 DIAGNOSIS — J181 Lobar pneumonia, unspecified organism: Secondary | ICD-10-CM

## 2019-05-19 DIAGNOSIS — I5022 Chronic systolic (congestive) heart failure: Secondary | ICD-10-CM | POA: Diagnosis not present

## 2019-05-19 DIAGNOSIS — I2510 Atherosclerotic heart disease of native coronary artery without angina pectoris: Secondary | ICD-10-CM | POA: Diagnosis not present

## 2019-05-19 DIAGNOSIS — E1111 Type 2 diabetes mellitus with ketoacidosis with coma: Secondary | ICD-10-CM | POA: Diagnosis not present

## 2019-05-19 DIAGNOSIS — R7989 Other specified abnormal findings of blood chemistry: Secondary | ICD-10-CM

## 2019-05-19 DIAGNOSIS — E119 Type 2 diabetes mellitus without complications: Secondary | ICD-10-CM

## 2019-05-19 DIAGNOSIS — Z794 Long term (current) use of insulin: Secondary | ICD-10-CM

## 2019-05-19 DIAGNOSIS — J189 Pneumonia, unspecified organism: Secondary | ICD-10-CM

## 2019-05-19 DIAGNOSIS — G9340 Encephalopathy, unspecified: Secondary | ICD-10-CM

## 2019-05-19 DIAGNOSIS — IMO0002 Reserved for concepts with insufficient information to code with codable children: Secondary | ICD-10-CM

## 2019-05-19 MED ORDER — ATORVASTATIN CALCIUM 80 MG PO TABS: 80.0000 mg | ORAL_TABLET | Freq: Every day | ORAL | 1 refills | Status: DC

## 2019-05-19 NOTE — Telephone Encounter (Signed)
Pharmacy calling in regarding prescription for atorvastin 80 mg. Per pharmacy this was prescribed by a hospitalist and the pharmacist would like to know if any refills need to be made and if so, an order from Dr. Rockey Situ. Please advise

## 2019-05-19 NOTE — Progress Notes (Signed)
Kristy [name] T. Haide Klinker, MD Primary Care and Kristy [name] at [facility] Kristy [name], [id number] Phone: [phone]   FAX: Kristy [name] - 68 y.o. female   MRN [id number]   Date of Birth: 01/17/1951  Visit Date: 05/19/2019   PCP: Kristy Loffler, MD   Referred by: Kristy Loffler, MD  Chief Complaint  Patient presents with   Hospitalization Follow-up   Subjective:   Kristy [name] is a 68 y.o. very pleasant female patient who presents with the following:  DATE OF ADMISSION:  04/19/2019    ADMITTING PHYSICIAN: Kristy Grayer, MD DATE OF DISCHARGE: 04/28/2019  Diabetic ketoacidosis without coma associated with other specified diabetes mellitus ([place]) [E13.10] Sepsis, due to unspecified organism, unspecified whether acute organ dysfunction present ([place]) [A41.9] Pneumonia [J18.9]  Acute on chronic HFrEF (heart failure with reduced ejection fraction) ([place])   DKA (diabetic ketoacidoses) ([place])   Pneumonia   Sepsis ([place])   Elevated troponin  Multiple falls.  No meds for a few days. The patient actually tells me that she had been taking her medication, and this was the poor history from her family member.  She did have some falls and has had multiple recent falls.  DKA, admitted on an insulin drip. ICU.  She has no memory this, but does recognize that her blood sugars were wildly out of control, and she does tell me that she has blood sugars relatively within 500 600 all the time.  She has had endocrinology evaluations by multiple people, and [facility] is currently helping her somewhat.  CH, cath, echo (END) in this hospitalization she has had both a cardiac cath as well as an echocardiogram and these have shown diminished ejection fraction, but improved compared to prior episodes.  Acute encephalopathy: Initially she was acutely encephalopathic, and this was deemed to be from DKA and she was admitted to the ICU and placed  on insulin drip.  Normal cultures, no sepsis  Needs to make an appointment with ENDO at [facility]  Past Medical History, Surgical History, Social History, Family History, Problem List, Medications, and Allergies have been reviewed and updated if relevant.  Patient Active Problem List   Diagnosis Date Noted   Elevated troponin    Sepsis ([place])    DKA (diabetic ketoacidoses) ([place]) 04/19/2019   Pneumonia 04/19/2019   NICM (nonischemic cardiomyopathy) ([place]) 11/08/2018   Acute on chronic HFrEF (heart failure with reduced ejection fraction) ([place]) 08/22/2018   Coronary artery disease, non-occlusive 09/13/2016   Sinus tachycardia 09/20/2015   Primary osteoarthritis involving multiple joints 03/14/2015   Personal history of other malignant neoplasm of skin 12/01/2014   Crohn's disease ([place]) 10/13/2013   Major depressive disorder, recurrent, in full remission ([place]) 05/01/2011   TRANSAMINASES, SERUM, ELEVATED 05/01/2010   PSORIASIS 08/02/2009   Hyperlipidemia 05/11/2009   GOUT 02/05/2009   Type 2 diabetes mellitus without complication, with long-term current use of insulin ([place])    Essential hypertension 04/18/2007   LBBB (left bundle branch block) 04/18/2007   ALLERGIC RHINITIS 04/18/2007   GALLSTONES 04/18/2007    Past Medical History:  Diagnosis Date   Allergic rhinitis    Allergy    Cataract    mild    Crohn's disease ([place]) 10/13/2013   Diverticulosis    GERD (gastroesophageal reflux disease)    Gout    HFrEF (heart failure with reduced ejection fraction) ([place])    a. 12/2008 Cath: EF 45% w/ inf HK; b.  07/2009 Echo: EF 50-55%; c. 08/2018 Echo: EF 20-25%.   Hyperlipidemia    Hypertension    IBS (irritable bowel syndrome)    Left bundle branch block    Neuromuscular disorder (HCC)    neuropathy   NICM (nonischemic cardiomyopathy) ([place])    a. 12/2008 Cath: no significant dzs, EF 45% w/ inf HK->Med Rx; b. 07/2009 Echo: EF 50-55%; c. 08/2018 Echo: EF  20-25%, ant/antsept HK, mild MR, mildly dil LA, nl RV fx; d. 08/2018 Cath: D1 80, otw nonobs dzs->Med rx.   Non-obstructive CAD (coronary artery disease)    a. 12/2008 Cath: no significant dzs, EF 45% w/ inf HK->Med Rx; b. 08/2018 Cath: LM nl, LAD min irregs, D1 80, LCX 33md, RCA min irregs->Med Rx.   Osteoarthritis    Poorly controlled Diabetes mellitus    a. 07/2018 A1c 13.1.   Symptomatic cholelithiasis     Past Surgical History:  Procedure Laterality Date   COLONOSCOPY     DILATION AND CURETTAGE OF UTERUS     LEFT HEART CATH AND CORONARY ANGIOGRAPHY N/A 04/24/2019   Procedure: LEFT HEART CATH AND CORONARY ANGIOGRAPHY;  Surgeon: Kristy Bush MD;  [facility];  Service: Cardiovascular;  Laterality: N/A;   RIGHT/LEFT HEART CATH AND CORONARY ANGIOGRAPHY N/A 08/26/2018   Procedure: RIGHT/LEFT HEART CATH AND CORONARY ANGIOGRAPHY;  Surgeon: Kristy Hampshire MD;  [facility];  Service: Cardiovascular;  Laterality: N/A;   SHOULDER SURGERY     15 + yrs ago    SKIN SURGERY     nose    VAGINAL HYSTERECTOMY      Social History   Socioeconomic History   Marital status: Widowed    Spouse name: Not on file   Number of children: Not on file   Years of education: Not on file   Highest education level: Not on file  Occupational History   Not on file  Social Needs   Financial resource strain: Not on file   Food insecurity    Worry: Not on file    Inability: Not on file   Transportation needs    Medical: Not on file    Non-medical: Not on file  Tobacco Use   Smoking status: Former Smoker    Packs/day: 0.75    Years: 30.00    Pack years: 22.50    Types: Cigarettes    Quit date: 06/06/1997    Years since quitting: 21.9   Smokeless tobacco: Never Used  Substance and Sexual Activity   Alcohol use: No   Drug use: No   Sexual activity: Not on file  Lifestyle   Physical activity    Days per week: Not on file     Minutes per session: Not on file   Stress: Not on file  Relationships   Social connections    Talks on phone: Not on file    Gets together: Not on file    Attends religious service: Not on file    Active member of club or organization: Not on file    Attends meetings of clubs or organizations: Not on file    Relationship status: Not on file   Intimate partner violence    Fear of current or ex partner: Not on file    Emotionally abused: Not on file    Physically abused: Not on file    Forced sexual activity: Not on file  Other Topics Concern   Not on file  Social History Narrative  Not on file    Family History  Problem Relation Age of Onset   Hypertension Father    Colon cancer Neg Hx    Colon polyps Neg Hx    Rectal cancer Neg Hx    Stomach cancer Neg Hx     Allergies  Allergen Reactions   Fish Oil Other (See Comments)    Gout   Glimepiride     REACTION: hypoglycemia   Guanfacine Hcl     REACTION: unspecified   Rosiglitazone Other (See Comments)    CHF    Medication list reviewed and updated in full in [place].   GEN: No acute illnesses, no fevers, chills. GI: No n/v/d, eating normally Pulm: No SOB BS 400-500 Interactive and getting along well at home.  Otherwise, ROS is as per the HPI.  Objective:   BP 130/66    Pulse 66    Temp 98.2 F (36.8 C) (Temporal)    Ht 5' 2.75" (1.594 m)    Wt 156 lb (70.8 kg)    SpO2 97%    BMI 27.85 kg/m   GEN: WDWN, NAD, Non-toxic, A & O x 3 HEENT: Atraumatic, Normocephalic. Neck supple. No masses, No LAD. Ears and Nose: No external deformity. CV: RRR, No M/G/R. No JVD. No thrill. No extra heart sounds. PULM: CTA B, no wheezes, crackles, rhonchi. No retractions. No resp. distress. No accessory muscle use. EXTR: No c/c/e NEURO Normal gait.  PSYCH: Normally interactive. Conversant. Not depressed or anxious appearing.  Calm demeanor.   Labs from hosp reviewed  Laboratory and Imaging  Data: Results for orders placed or performed in visit on 05/19/19  CBC with Differential/Platelet  Result Value Ref Range   WBC 9.0 4.0 - 10.5 K/uL   RBC 4.35 3.87 - 5.11 Mil/uL   Hemoglobin 13.1 12.0 - 15.0 g/dL   HCT 41.1 36.0 - 46.0 %   MCV 94.4 78.0 - 100.0 fl   MCHC 31.9 30.0 - 36.0 g/dL   RDW 14.8 11.5 - 15.5 %   Platelets 196.0 150.0 - 400.0 K/uL   Neutrophils Relative % 50.7 43.0 - 77.0 %   Lymphocytes Relative 35.7 12.0 - 46.0 %   Monocytes Relative 8.6 3.0 - 12.0 %   Eosinophils Relative 2.7 0.0 - 5.0 %   Basophils Relative 2.3 0.0 - 3.0 %   Neutro Abs 4.6 1.4 - 7.7 K/uL   Lymphs Abs 3.2 0.7 - 4.0 K/uL   Monocytes Absolute 0.8 0.1 - 1.0 K/uL   Eosinophils Absolute 0.2 0.0 - 0.7 K/uL   Basophils Absolute 0.2 (H) 0.0 - 0.1 K/uL  Basic metabolic panel  Result Value Ref Range   Sodium 134 (L) 135 - 145 mEq/L   Potassium 5.0 3.5 - 5.1 mEq/L   Chloride 97 96 - 112 mEq/L   CO2 26 19 - 32 mEq/L   Glucose, Bld 416 (H) 70 - 99 mg/dL   BUN 12 6 - 23 mg/dL   Creatinine, Ser 0.94 0.40 - 1.20 mg/dL   Calcium 10.0 8.4 - 10.5 mg/dL   GFR 59.23 (L) >60.00 mL/min    Mr Brain Wo Contrast  Result Date: 04/20/2019 CLINICAL DATA:  Recent fall. Altered mental status. Negative CT evaluation. EXAM: MRI HEAD WITHOUT CONTRAST TECHNIQUE: Multiplanar, multiecho pulse sequences of the brain and surrounding structures were obtained without intravenous contrast. COMPARISON:  CT 04/19/2019 FINDINGS: Brain: Diffusion imaging does not show any acute or subacute infarction. The brainstem and cerebellum are normal. Cerebral hemispheres  show mild chronic small-vessel ischemic change of the white matter. No cortical or large vessel territory infarction. No mass lesion, hemorrhage, hydrocephalus or extra-axial collection. Vascular: Major vessels at the base of the brain show flow. Skull and upper cervical spine: Negative Sinuses/Orbits: Clear/normal Other: None IMPRESSION: No acute or significant finding. Mild  chronic small-vessel ischemic change of the hemispheric white matter, often seen at this age. Electronically Signed   By: Nelson Chimes M.D.   On: 04/20/2019 14:32   Dg Chest Port 1 View  Result Date: 04/23/2019 CLINICAL DATA:  Respiratory failure. EXAM: PORTABLE CHEST 1 VIEW COMPARISON:  04/21/2019. FINDINGS: Cardiomegaly with mild pulmonary venous congestion. Mild bilateral interstitial prominence. Mild CHF cannot be excluded. Pneumonitis cannot be excluded. Mild bibasilar atelectasis. No prominent pleural effusion or pneumothorax. IMPRESSION: 1. Cardiomegaly with mild pulmonary venous congestion bilateral interstitial prominence. Mild CHF cannot be excluded. Pneumonitis cannot be excluded. 2.  Mild bibasilar atelectasis. Electronically Signed   By: Marcello Moores  Register   On: 04/23/2019 08:18   Dg Chest Port 1 View  Result Date: 04/21/2019 CLINICAL DATA:  Acute respiratory failure EXAM: PORTABLE CHEST 1 VIEW COMPARISON:  None. FINDINGS: Cardiac shadow is mildly enlarged but stable. Lungs are well aerated bilaterally. No focal infiltrate or sizable effusion is seen. No bony abnormality is noted. IMPRESSION: No acute abnormality noted. Electronically Signed   By: Inez Catalina M.D.   On: 04/21/2019 21:53    Assessment and Plan:     ICD-10-CM   1. Diabetic ketoacidosis with coma associated with type 2 diabetes mellitus ([place])  E11.11   2. Type 2 diabetes mellitus without complication, with long-term current use of insulin (HCC)  E11.9 CBC with Differential/Platelet   V69.7 Basic metabolic panel  3. Hypokalemia  E87.6 CBC with Differential/Platelet    Basic metabolic panel  4. Chronic systolic heart failure (HCC)  I50.22 CBC with Differential/Platelet    Basic metabolic panel  5. Pneumonia of lower lobe due to infectious organism, unspecified laterality ([place])  J18.1   6. Elevated troponin  R79.89   7. Uncontrolled type 2 diabetes mellitus with stage 3 chronic kidney disease, with long-term current use  of insulin (HCC)  E11.22    E11.65    N18.3    Z79.4   8. Acute on chronic HFrEF (heart failure with reduced ejection fraction) (HCC)  I50.23   9. Coronary artery disease, non-occlusive  I25.10   10. Encephalopathy acute  G93.40    >40 minutes spent in face to face time with patient, >50% spent in counselling or coordination of care: Very complicated medical case.  I reviewed all of the details of her admission with the patient.  She did not seem to be clear on all of this.  She seemed to be somewhat unhappy about the care she received and communication.  She was also unhappy with some of the decisions that some of her family members made.  I did my best to inform her of everything, and she will likely have her healthcare power of attorney who is a nurse be in charge of much of her decisions in the future.  Wildly out-of-control diabetes.  Hopefully do can make a difference.  Follow-up: f/u [facility] - please call  No orders of the defined types were placed in this encounter.  Orders Placed This Encounter  Procedures   CBC with Differential/Platelet   Basic metabolic panel    Signed,  Lamoyne Palencia T. Kailer Heindel, MD   Outpatient Encounter Medications as of 05/19/2019  Medication Sig   acetaminophen (TYLENOL) 325 MG tablet Take 325 mg by mouth daily as needed for moderate pain or headache.   aspirin EC 81 MG EC tablet Take 1 tablet (81 mg total) by mouth daily.   carvedilol (COREG) 12.5 MG tablet Take 1 tablet (12.5 mg total) by mouth 2 (two) times daily.   chlorpheniramine (CHLOR-TRIMETON) 4 MG tablet Take 4 mg by mouth daily as needed for allergies.   clopidogrel (PLAVIX) 75 MG tablet Take 1 tablet (75 mg total) by mouth daily with breakfast.   CORLANOR 5 MG TABS tablet TAKE ONE TABLET TWICE DAILY WITH MEALS   ENTRESTO 49-51 MG TAKE ONE TABLET TWICE DAILY STARTING SUNDAY 09/15/18   FLUoxetine (PROZAC) 20 MG capsule Take 1 capsule (20 mg total) by mouth daily.   insulin regular human  CONCENTRATED (HUMULIN R U-500 KWIKPEN) 500 UNIT/ML kwikpen Inject 60-100 Units into the skin See admin instructions. Inject 100u under the skin at breakfast and lunch and inject 80u under the skin at dinner - inject 60u if eating a small / protein rich meal   Insulin Syringe-Needle U-100 (B-D INS SYRINGE 0.5CC/31GX5/16) 31G X 5/16" 0.5 ML MISC USE AS DIRECTED THREE TIMES A DAY   metroNIDAZOLE (METROGEL) 0.75 % gel Apply 1 application topically daily as needed (acne).   NONFORMULARY OR COMPOUNDED ITEM See pharmacy note (Patient taking differently: Apply 1 application topically at bedtime as needed (neuropathy). Peripheral Neuropathy Cream-  Bupivacaine 1%, Doxepin 3%, Gabapentin 6%, Pentoxifylline 3%, Topiramate 1%  Apply 1-2 grams to affected area 3-4 times daily  Qty. 120 gm)   ONE TOUCH ULTRA TEST test strip USE AS PER PACKAGE INSTRUCTIONS CHECK SUGARS UP TO-FOUR TIMES A DAY OR AS PRESCRIBED   Probiotic Product (ALIGN) 4 MG CAPS Take 4 mg by mouth daily.    spironolactone (ALDACTONE) 25 MG tablet Take 1 tablet (25 mg total) by mouth daily.   [DISCONTINUED] atorvastatin (LIPITOR) 80 MG tablet Take 1 tablet (80 mg total) by mouth daily at 6 PM.   No facility-administered encounter medications on file as of 05/19/2019.

## 2019-05-19 NOTE — Telephone Encounter (Signed)
Per Christell Faith, PA 05/09/19 o/v  Hyperlipidemia: Most recent lipid panel showed a triglyceride level of 209 and LDL of 103.  Goal LDL less than 70.  She was not compliant with Lipitor when this lipid panel was checked.  She does indicate compliance with this medication at this time.  She is wondering if she is having some myalgias associated with it.  She would prefer to continue to remain on Lipitor for the next 4 weeks and if the myalgias worsen she is agreeable to undergo a trial of Crestor.  Recommend follow-up lipid and liver function in approximately 8 weeks.

## 2019-05-20 ENCOUNTER — Encounter: Payer: Self-pay | Admitting: Family Medicine

## 2019-05-20 LAB — CBC WITH DIFFERENTIAL/PLATELET
Basophils Absolute: 0.2 10*3/uL — ABNORMAL HIGH (ref 0.0–0.1)
Basophils Relative: 2.3 % (ref 0.0–3.0)
Eosinophils Absolute: 0.2 10*3/uL (ref 0.0–0.7)
Eosinophils Relative: 2.7 % (ref 0.0–5.0)
HCT: 41.1 % (ref 36.0–46.0)
Hemoglobin: 13.1 g/dL (ref 12.0–15.0)
Lymphocytes Relative: 35.7 % (ref 12.0–46.0)
Lymphs Abs: 3.2 10*3/uL (ref 0.7–4.0)
MCHC: 31.9 g/dL (ref 30.0–36.0)
MCV: 94.4 fl (ref 78.0–100.0)
Monocytes Absolute: 0.8 10*3/uL (ref 0.1–1.0)
Monocytes Relative: 8.6 % (ref 3.0–12.0)
Neutro Abs: 4.6 10*3/uL (ref 1.4–7.7)
Neutrophils Relative %: 50.7 % (ref 43.0–77.0)
Platelets: 196 10*3/uL (ref 150.0–400.0)
RBC: 4.35 Mil/uL (ref 3.87–5.11)
RDW: 14.8 % (ref 11.5–15.5)
WBC: 9 10*3/uL (ref 4.0–10.5)

## 2019-05-20 LAB — BASIC METABOLIC PANEL
BUN: 12 mg/dL (ref 6–23)
CO2: 26 mEq/L (ref 19–32)
Calcium: 10 mg/dL (ref 8.4–10.5)
Chloride: 97 mEq/L (ref 96–112)
Creatinine, Ser: 0.94 mg/dL (ref 0.40–1.20)
GFR: 59.23 mL/min — ABNORMAL LOW (ref >60.00)
Glucose, Bld: 416 mg/dL — ABNORMAL HIGH (ref 70–99)
Potassium: 5 mEq/L (ref 3.5–5.1)
Sodium: 134 mEq/L — ABNORMAL LOW (ref 135–145)

## 2019-05-23 ENCOUNTER — Other Ambulatory Visit: Payer: Self-pay | Admitting: Family Medicine

## 2019-05-23 DIAGNOSIS — Z1231 Encounter for screening mammogram for malignant neoplasm of breast: Secondary | ICD-10-CM

## 2019-05-26 ENCOUNTER — Ambulatory Visit
Admission: RE | Admit: 2019-05-26 | Discharge: 2019-05-26 | Disposition: A | Discharge status: Home / Self Care | Payer: Medicare HMO | Source: Ambulatory Visit | Attending: Family Medicine | Admitting: Family Medicine

## 2019-05-26 DIAGNOSIS — Z1231 Encounter for screening mammogram for malignant neoplasm of breast: Secondary | ICD-10-CM | POA: Diagnosis not present

## 2019-06-03 ENCOUNTER — Other Ambulatory Visit
Admission: RE | Admit: 2019-06-03 | Discharge: 2019-06-03 | Disposition: A | Discharge status: Home / Self Care | Payer: Medicare HMO | Source: Ambulatory Visit | Attending: Physician Assistant | Admitting: Physician Assistant

## 2019-06-03 DIAGNOSIS — I428 Other cardiomyopathies: Secondary | ICD-10-CM

## 2019-06-03 DIAGNOSIS — I5022 Chronic systolic (congestive) heart failure: Secondary | ICD-10-CM | POA: Diagnosis not present

## 2019-06-03 LAB — CBC WITH DIFFERENTIAL/PLATELET
Abs Immature Granulocytes: 0.03 10*3/uL (ref 0.00–0.07)
Basophils Absolute: 0 10*3/uL (ref 0.0–0.1)
Basophils Relative: 1 %
Eosinophils Absolute: 0.2 10*3/uL (ref 0.0–0.5)
Eosinophils Relative: 2 %
HCT: 41.2 % (ref 36.0–46.0)
Hemoglobin: 13.3 g/dL (ref 12.0–15.0)
Immature Granulocytes: 0 %
Lymphocytes Relative: 38 %
Lymphs Abs: 3.2 10*3/uL (ref 0.7–4.0)
MCH: 30 pg (ref 26.0–34.0)
MCHC: 32.3 g/dL (ref 30.0–36.0)
MCV: 92.8 fL (ref 80.0–100.0)
Monocytes Absolute: 0.7 10*3/uL (ref 0.1–1.0)
Monocytes Relative: 8 %
Neutro Abs: 4.3 10*3/uL (ref 1.7–7.7)
Neutrophils Relative %: 51 %
Platelets: 183 10*3/uL (ref 150–400)
RBC: 4.44 MIL/uL (ref 3.87–5.11)
RDW: 13.6 % (ref 11.5–15.5)
WBC: 8.4 10*3/uL (ref 4.0–10.5)
nRBC: 0 % (ref 0.0–0.2)

## 2019-06-03 LAB — BASIC METABOLIC PANEL
Anion gap: 10 (ref 5–15)
BUN: 15 mg/dL (ref 8–23)
CO2: 22 mmol/L (ref 22–32)
Calcium: 9.6 mg/dL (ref 8.9–10.3)
Chloride: 105 mmol/L (ref 98–111)
Creatinine, Ser: 0.88 mg/dL (ref 0.44–1.00)
GFR calc Af Amer: >60 mL/min (ref >60)
GFR calc non Af Amer: >60 mL/min (ref >60)
Glucose, Bld: 307 mg/dL — ABNORMAL HIGH (ref 70–99)
Potassium: 4.4 mmol/L (ref 3.5–5.1)
Sodium: 137 mmol/L (ref 135–145)

## 2019-06-05 ENCOUNTER — Other Ambulatory Visit: Payer: Self-pay | Admitting: Family Medicine

## 2019-06-05 DIAGNOSIS — R928 Other abnormal and inconclusive findings on diagnostic imaging of breast: Secondary | ICD-10-CM

## 2019-06-05 DIAGNOSIS — R921 Mammographic calcification found on diagnostic imaging of breast: Secondary | ICD-10-CM

## 2019-06-06 DIAGNOSIS — R69 Illness, unspecified: Secondary | ICD-10-CM | POA: Diagnosis not present

## 2019-06-09 NOTE — Progress Notes (Signed)
Cardiology Office Note  Date:  06/11/2019   ID:  Kristy [name], DOB [birth year], MRN [id number]  PCP:  Kristy Loffler, MD   Chief Complaint  Patient presents with  . Other    1 month follow up. Patient c/o chest pain. Meds reviewed verbally with patient.    Cardiology Office Note  Date:  06/11/2019   ID:  Kristy [name], DOB Jun 10, 1951, MRN [id number]  PCP:  Kristy Loffler, MD   Chief Complaint  Patient presents with  . Other    1 month follow up. Patient c/o chest pain. Meds reviewed verbally with patient.     HPI:  Kristy [name] is a 68 yo with HTN,  diabetes, Hemoglobin A1c of 12.4, poorly controlled GI disease/IBS,  Hyperlipidemia, LDL 168 h/o smoking,  Previous cardiac catheterization in 2009 showed mild luminal irregularities.   Crohn's disease. Normal EF in 07/2009 chronic tachycardia Nonischemic cardiomyopathy ejection fraction 20 to 25% presents for followup of her hyperlipidemia, coronary artery disease and tachycardia  Feels well, Out of plavix Active. No chest pain, no sob  In hospital 04/2019, records reviewed Demand ischemia in the setting of nonischemic cardiomyopathy, severe DKA.  -Catheterization with stable appearance of coronary arteries .    Echocardiogram July 2000 20 drop in ejection fraction 35 to 40% Back in March 2020 ejection fraction 50 to 55%  Heart catheterization April 24, 2019 1. Stable appearance of coronary arteries since 08/2018 without new lesion to explain recent decline in LVEF or troponin elevation.  I suspect elevated troponin may be due to supply-demand mismatch and/or stress-induced cardiomyopathy in the setting of severe DKA. 2. Focal D1 stenosis remains ~80%.  There is mild to moderate, non-obstructive disease involving the LAD and LCx. 3. Mildly elevated left ventricular filling pressure.  Other past medical hx Seen in the emergency room October 30, 2018 Had a fall, was confused, back pain Dehydrated with  creatinine 1.82 BUN 24 glucose 350   Previously seen by endocrine in [place] Was recommended to go to [facility], did not go Reports that insulin does not work for her  Severe gait instability leading to falls  Echo 08/2018 - Left ventricle: The cavity size was mildly dilated. Systolic   function was severely reduced. The estimated ejection fraction   was in the range of 20% to 25%.   Husband passed away from complications of severe underlying lung disease, fibrosis    retired August 2017 Previously reported that "none of her diabetes medications work, on "300 units of insulin, does not work"    PMH:   has a past medical history of Allergic rhinitis, Allergy, Cataract, Crohn's disease ([place]) (10/13/2013), Diverticulosis, GERD (gastroesophageal reflux disease), Gout, HFrEF (heart failure with reduced ejection fraction) ([place]), Hyperlipidemia, Hypertension, IBS (irritable bowel syndrome), Left bundle branch block, Neuromuscular disorder ([place]), NICM (nonischemic cardiomyopathy) ([place]), Non-obstructive CAD (coronary artery disease), Osteoarthritis, Poorly controlled Diabetes mellitus, Symptomatic cholelithiasis, and Uncontrolled type 2 diabetes mellitus with stage 3 chronic kidney disease, with long-term current use of insulin ([place]).  PSH:    Past Surgical History:  Procedure Laterality Date  . BREAST CYST ASPIRATION    . COLONOSCOPY    . DILATION AND CURETTAGE OF UTERUS    . LEFT HEART CATH AND CORONARY ANGIOGRAPHY N/A 04/24/2019   Procedure: LEFT HEART CATH AND CORONARY ANGIOGRAPHY;  Surgeon: Nelva Bush, MD;  Location: [place] [facility];  Service: Cardiovascular;  Laterality: N/A;  . RIGHT/LEFT HEART CATH AND CORONARY ANGIOGRAPHY N/A 08/26/2018  Procedure: RIGHT/LEFT HEART CATH AND CORONARY ANGIOGRAPHY;  Surgeon: Wellington Hampshire, MD;  Location: [place] [facility];  Service: Cardiovascular;  Laterality: N/A;  . SHOULDER SURGERY     15 + yrs ago   . SKIN SURGERY     nose    . VAGINAL HYSTERECTOMY      Current Outpatient Medications  Medication Sig Dispense Refill  . acetaminophen (TYLENOL) 325 MG tablet Take 325 mg by mouth daily as needed for moderate pain or headache.    Marland Kitchen aspirin EC 81 MG EC tablet Take 1 tablet (81 mg total) by mouth daily.    Marland Kitchen atorvastatin (LIPITOR) 80 MG tablet Take 1 tablet (80 mg total) by mouth daily at 6 PM. 30 tablet 1  . carvedilol (COREG) 12.5 MG tablet Take 1 tablet (12.5 mg total) by mouth 2 (two) times daily. 60 tablet 3  . chlorpheniramine (CHLOR-TRIMETON) 4 MG tablet Take 4 mg by mouth daily as needed for allergies.    Marland Kitchen clopidogrel (PLAVIX) 75 MG tablet Take 1 tablet (75 mg total) by mouth daily with breakfast. 30 tablet 0  . CORLANOR 5 MG TABS tablet TAKE ONE TABLET TWICE DAILY WITH MEALS 60 tablet 1  . ENTRESTO 49-51 MG TAKE ONE TABLET TWICE DAILY STARTING SUNDAY 09/15/18 60 tablet 11  . FLUoxetine (PROZAC) 20 MG capsule Take 1 capsule (20 mg total) by mouth daily. 90 capsule 3  . insulin regular human CONCENTRATED (HUMULIN R U-500 KWIKPEN) 500 UNIT/ML kwikpen Inject 60-100 Units into the skin See admin instructions. Inject 100u under the skin at breakfast and lunch and inject 80u under the skin at dinner - inject 60u if eating a small / protein rich meal    . Insulin Syringe-Needle U-100 (B-D INS SYRINGE 0.5CC/31GX5/16) 31G X 5/16" 0.5 ML MISC USE AS DIRECTED THREE TIMES A DAY 100 each 11  . metroNIDAZOLE (METROGEL) 0.75 % gel Apply 1 application topically daily as needed (acne).    . NONFORMULARY OR COMPOUNDED ITEM See pharmacy note (Patient taking differently: Apply 1 application topically at bedtime as needed (neuropathy). Peripheral Neuropathy Cream-  Bupivacaine 1%, Doxepin 3%, Gabapentin 6%, Pentoxifylline 3%, Topiramate 1%  Apply 1-2 grams to affected area 3-4 times daily  Qty. 120 gm) 120 each 2  . ONE TOUCH ULTRA TEST test strip USE AS PER PACKAGE INSTRUCTIONS CHECK SUGARS UP TO-FOUR TIMES A DAY OR AS PRESCRIBED  150 each 11  . Probiotic Product (ALIGN) 4 MG CAPS Take 4 mg by mouth daily.     Marland Kitchen spironolactone (ALDACTONE) 25 MG tablet Take 1 tablet (25 mg total) by mouth daily. 30 tablet 0   No current facility-administered medications for this visit.      Allergies:   Fish oil, Glimepiride, Guanfacine hcl, and Rosiglitazone   Social History:  The patient  reports that she quit smoking about 22 years ago. Her smoking use included cigarettes. She has a 22.50 pack-year smoking history. She has never used smokeless tobacco. She reports that she does not drink alcohol or use drugs.   Family History:   family history includes Hypertension in her father.    Review of Systems: Review of Systems  Constitutional: Positive for malaise/fatigue.  HENT: Negative.   Respiratory: Negative.   Cardiovascular: Negative.   Gastrointestinal: Negative.   Musculoskeletal: Negative.        Gait instability  Neurological: Negative.   Psychiatric/Behavioral: Negative.   All other systems reviewed and are negative.    PHYSICAL EXAM: VS:  BP 130/60 (BP Location: Left Arm, Patient Position: Sitting, Cuff Size: Normal)   Pulse 83   Ht 5' 3"  (1.6 m)   Wt 156 lb (70.8 kg)   BMI 27.63 kg/m  , BMI Body mass index is 27.63 kg/m. Constitutional:  oriented to person, place, and time. No distress.  HENT:  Head: Grossly normal Eyes:  no discharge. No scleral icterus.  Neck: No JVD, no carotid bruits  Cardiovascular: Regular rate and rhythm, no murmurs appreciated Pulmonary/Chest: Clear to auscultation bilaterally, no wheezes or rails Abdominal: Soft.  no distension.  no tenderness.  Musculoskeletal: Normal range of motion Neurological:  normal muscle tone. Coordination normal. No atrophy Skin: Skin warm and dry Psychiatric: normal affect, pleasant  Recent Labs: 04/19/2019: TSH 0.586 04/21/2019: Magnesium 1.8 04/25/2019: ALT 19 06/03/2019: BUN 15; Creatinine, Ser 0.88; Hemoglobin 13.3; Platelets 183; Potassium 4.4;  Sodium 137    Lipid Panel Lab Results  Component Value Date   CHOL 170 04/22/2019   HDL 25 (L) 04/22/2019   LDLCALC 103 (H) 04/22/2019   TRIG 209 (H) 04/22/2019     Wt Readings from Last 3 Encounters:  06/11/19 156 lb (70.8 kg)  05/19/19 156 lb (70.8 kg)  05/09/19 151 lb 1.9 oz (68.5 kg)     ASSESSMENT AND PLAN:   Essential hypertension - Plan: EKG 12-Lead Blood pressure is well controlled on today's visit. No changes made to the medications.  Sinus tachycardia - Plan: EKG 12-Lead Rate well controlled  Cad with stable disease No change Single-vessel disease, medical management  Type 2 diabetes mellitus with vascular disease (HCC) Hemoglobin A1c  Markedly elevated [employer] paying for insulin  endocrinology at [facility]  Other hyperlipidemia On lipitor, stressed compliance  CAD cardiac catheterization severe single-vessel disease, medical management Challenging DM  Nonischemic cardiomyopathy Stay on current meds  Acute renal failure CR 0.88, back to baseline   Total encounter time more than 25 minutes  Greater than 50% was spent in counseling and coordination of care with the patient   Disposition:   F/U  12 months   No orders of the defined types were placed in this encounter.    Signed, Esmond Plants, M.D., Ph.D. 06/11/2019  [facility] [place], [place] [phone]

## 2019-06-11 ENCOUNTER — Ambulatory Visit (INDEPENDENT_AMBULATORY_CARE_PROVIDER_SITE_OTHER): Payer: Medicare HMO | Admitting: Cardiovascular Disease

## 2019-06-11 ENCOUNTER — Other Ambulatory Visit: Payer: Self-pay

## 2019-06-11 VITALS — BP 130/60 | HR 83 | Ht 63.0 in | Wt 156.0 lb

## 2019-06-11 DIAGNOSIS — E1165 Type 2 diabetes mellitus with hyperglycemia: Secondary | ICD-10-CM | POA: Diagnosis not present

## 2019-06-11 DIAGNOSIS — R000 Tachycardia, unspecified: Secondary | ICD-10-CM | POA: Diagnosis not present

## 2019-06-11 DIAGNOSIS — I10 Essential (primary) hypertension: Secondary | ICD-10-CM

## 2019-06-11 DIAGNOSIS — I5022 Chronic systolic (congestive) heart failure: Secondary | ICD-10-CM

## 2019-06-11 DIAGNOSIS — Z794 Long term (current) use of insulin: Secondary | ICD-10-CM | POA: Diagnosis not present

## 2019-06-11 DIAGNOSIS — E785 Hyperlipidemia, unspecified: Secondary | ICD-10-CM

## 2019-06-11 DIAGNOSIS — I428 Other cardiomyopathies: Secondary | ICD-10-CM | POA: Diagnosis not present

## 2019-06-11 DIAGNOSIS — I2510 Atherosclerotic heart disease of native coronary artery without angina pectoris: Secondary | ICD-10-CM

## 2019-06-11 MED ORDER — CLOPIDOGREL BISULFATE 75 MG PO TABS: 75.0000 mg | ORAL_TABLET | Freq: Every day | ORAL | 3 refills | Status: DC

## 2019-06-11 MED ORDER — CARVEDILOL 12.5 MG PO TABS: 12.5000 mg | ORAL_TABLET | Freq: Two times a day (BID) | ORAL | 3 refills | Status: DC

## 2019-06-11 NOTE — Patient Instructions (Signed)

## 2019-06-13 ENCOUNTER — Ambulatory Visit
Admission: RE | Admit: 2019-06-13 | Discharge: 2019-06-13 | Disposition: A | Discharge status: Home / Self Care | Payer: Medicare HMO | Source: Ambulatory Visit | Attending: Family Medicine | Admitting: Family Medicine

## 2019-06-13 DIAGNOSIS — R928 Other abnormal and inconclusive findings on diagnostic imaging of breast: Secondary | ICD-10-CM | POA: Diagnosis not present

## 2019-06-13 DIAGNOSIS — R921 Mammographic calcification found on diagnostic imaging of breast: Secondary | ICD-10-CM | POA: Diagnosis not present

## 2019-06-16 ENCOUNTER — Other Ambulatory Visit: Payer: Self-pay | Admitting: Family Medicine

## 2019-06-16 DIAGNOSIS — R921 Mammographic calcification found on diagnostic imaging of breast: Secondary | ICD-10-CM

## 2019-06-16 DIAGNOSIS — R928 Other abnormal and inconclusive findings on diagnostic imaging of breast: Secondary | ICD-10-CM

## 2019-06-24 ENCOUNTER — Ambulatory Visit
Admission: RE | Admit: 2019-06-24 | Discharge: 2019-06-24 | Disposition: A | Discharge status: Home / Self Care | Payer: Medicare HMO | Source: Ambulatory Visit | Attending: Family Medicine | Admitting: Family Medicine

## 2019-06-24 DIAGNOSIS — R928 Other abnormal and inconclusive findings on diagnostic imaging of breast: Secondary | ICD-10-CM

## 2019-06-24 DIAGNOSIS — R921 Mammographic calcification found on diagnostic imaging of breast: Secondary | ICD-10-CM

## 2019-06-24 DIAGNOSIS — N6021 Fibroadenosis of right breast: Secondary | ICD-10-CM | POA: Diagnosis not present

## 2019-06-24 HISTORY — PX: BREAST BIOPSY: SHX20

## 2019-06-25 ENCOUNTER — Other Ambulatory Visit: Payer: Self-pay | Admitting: Family Medicine

## 2019-06-25 LAB — SURGICAL PATHOLOGY

## 2019-06-27 NOTE — Telephone Encounter (Signed)
Received PA for test strips.  Insurance will only cover testing 4 times a time for insulin dependent diabetics.  Pharmacy notified of this information.

## 2019-07-03 ENCOUNTER — Other Ambulatory Visit: Payer: Self-pay

## 2019-07-03 ENCOUNTER — Emergency Department
Admission: EM | Admit: 2019-07-03 | Discharge: 2019-07-03 | Disposition: A | Discharge status: Home / Self Care | Payer: Medicare HMO | Attending: Emergency Medicine | Admitting: Emergency Medicine

## 2019-07-03 ENCOUNTER — Emergency Department: Payer: Medicare HMO

## 2019-07-03 DIAGNOSIS — Z87891 Personal history of nicotine dependence: Secondary | ICD-10-CM | POA: Diagnosis not present

## 2019-07-03 DIAGNOSIS — I509 Heart failure, unspecified: Secondary | ICD-10-CM | POA: Diagnosis not present

## 2019-07-03 DIAGNOSIS — E1122 Type 2 diabetes mellitus with diabetic chronic kidney disease: Secondary | ICD-10-CM | POA: Insufficient documentation

## 2019-07-03 DIAGNOSIS — N3090 Cystitis, unspecified without hematuria: Secondary | ICD-10-CM | POA: Insufficient documentation

## 2019-07-03 DIAGNOSIS — I130 Hypertensive heart and chronic kidney disease with heart failure and stage 1 through stage 4 chronic kidney disease, or unspecified chronic kidney disease: Secondary | ICD-10-CM | POA: Diagnosis not present

## 2019-07-03 DIAGNOSIS — M79662 Pain in left lower leg: Secondary | ICD-10-CM | POA: Diagnosis present

## 2019-07-03 DIAGNOSIS — E1165 Type 2 diabetes mellitus with hyperglycemia: Secondary | ICD-10-CM | POA: Diagnosis not present

## 2019-07-03 DIAGNOSIS — N183 Chronic kidney disease, stage 3 unspecified: Secondary | ICD-10-CM | POA: Insufficient documentation

## 2019-07-03 DIAGNOSIS — R350 Frequency of micturition: Secondary | ICD-10-CM | POA: Insufficient documentation

## 2019-07-03 DIAGNOSIS — Z794 Long term (current) use of insulin: Secondary | ICD-10-CM | POA: Insufficient documentation

## 2019-07-03 DIAGNOSIS — M7122 Synovial cyst of popliteal space [Baker], left knee: Secondary | ICD-10-CM

## 2019-07-03 DIAGNOSIS — R600 Localized edema: Secondary | ICD-10-CM | POA: Diagnosis not present

## 2019-07-03 LAB — COMPREHENSIVE METABOLIC PANEL
ALT: 12 U/L (ref 0–44)
AST: 18 U/L (ref 15–41)
Albumin: 3.8 g/dL (ref 3.5–5.0)
Alkaline Phosphatase: 73 U/L (ref 38–126)
Anion gap: 10 (ref 5–15)
BUN: 26 mg/dL — ABNORMAL HIGH (ref 8–23)
CO2: 24 mmol/L (ref 22–32)
Calcium: 9.8 mg/dL (ref 8.9–10.3)
Chloride: 98 mmol/L (ref 98–111)
Creatinine, Ser: 1.17 mg/dL — ABNORMAL HIGH (ref 0.44–1.00)
GFR calc Af Amer: 55 mL/min — ABNORMAL LOW (ref >60)
GFR calc non Af Amer: 48 mL/min — ABNORMAL LOW (ref >60)
Glucose, Bld: 446 mg/dL — ABNORMAL HIGH (ref 70–99)
Potassium: 4.1 mmol/L (ref 3.5–5.1)
Sodium: 132 mmol/L — ABNORMAL LOW (ref 135–145)
Total Bilirubin: 1 mg/dL (ref 0.3–1.2)
Total Protein: 7.8 g/dL (ref 6.5–8.1)

## 2019-07-03 LAB — GLUCOSE, CAPILLARY
Glucose-Capillary: 503 mg/dL (ref 70–99)
Glucose-Capillary: 311 mg/dL — ABNORMAL HIGH (ref 70–99)

## 2019-07-03 LAB — CBC WITH DIFFERENTIAL/PLATELET
Abs Immature Granulocytes: 0.04 10*3/uL (ref 0.00–0.07)
Basophils Absolute: 0.1 10*3/uL (ref 0.0–0.1)
Basophils Relative: 0 %
Eosinophils Absolute: 0 10*3/uL (ref 0.0–0.5)
Eosinophils Relative: 0 %
HCT: 37.2 % (ref 36.0–46.0)
Hemoglobin: 11.9 g/dL — ABNORMAL LOW (ref 12.0–15.0)
Immature Granulocytes: 0 %
Lymphocytes Relative: 23 %
Lymphs Abs: 3.2 10*3/uL (ref 0.7–4.0)
MCH: 29.5 pg (ref 26.0–34.0)
MCHC: 32 g/dL (ref 30.0–36.0)
MCV: 92.3 fL (ref 80.0–100.0)
Monocytes Absolute: 1.7 10*3/uL — ABNORMAL HIGH (ref 0.1–1.0)
Monocytes Relative: 12 %
Neutro Abs: 9.1 10*3/uL — ABNORMAL HIGH (ref 1.7–7.7)
Neutrophils Relative %: 65 %
Platelets: 211 10*3/uL (ref 150–400)
RBC: 4.03 MIL/uL (ref 3.87–5.11)
RDW: 12.6 % (ref 11.5–15.5)
WBC: 14.1 10*3/uL — ABNORMAL HIGH (ref 4.0–10.5)
nRBC: 0 % (ref 0.0–0.2)

## 2019-07-03 LAB — URINALYSIS, COMPLETE (UACMP) WITH MICROSCOPIC
Bacteria, UA: NONE SEEN
Bilirubin Urine: NEGATIVE
Glucose, UA: >=500 mg/dL — AB
Hgb urine dipstick: NEGATIVE
Ketones, ur: NEGATIVE mg/dL
Nitrite: NEGATIVE
Protein, ur: NEGATIVE mg/dL
Specific Gravity, Urine: 1.021 (ref 1.005–1.030)
pH: 6 (ref 5.0–8.0)

## 2019-07-03 MED ORDER — INSULIN ASPART 100 UNIT/ML ~~LOC~~ SOLN
10.0000 [IU] | Freq: Once | SUBCUTANEOUS | Status: AC
  Administered 2019-07-03: 10 [IU] via INTRAVENOUS
  Filled 2019-07-03: qty 1

## 2019-07-03 MED ORDER — SODIUM CHLORIDE 0.9 % IV SOLN
Freq: Once | INTRAVENOUS | Status: AC
  Administered 2019-07-03: 14:00:00 via INTRAVENOUS

## 2019-07-03 MED ORDER — SODIUM CHLORIDE 0.9 % IV SOLN
1.0000 g | Freq: Once | INTRAVENOUS | Status: AC
  Administered 2019-07-03: 1 g via INTRAVENOUS
  Filled 2019-07-03: qty 10

## 2019-07-03 MED ORDER — CEPHALEXIN 500 MG PO CAPS: 500.0000 mg | ORAL_CAPSULE | Freq: Four times a day (QID) | ORAL | 0 refills | Status: DC

## 2019-07-03 NOTE — ED Provider Notes (Signed)
[facility] Emergency Department Provider Note       Time seen: ----------------------------------------- 10:57 AM on 07/03/2019 -----------------------------------------   I have reviewed the triage vital signs and the nursing notes.  HISTORY   Chief Complaint Leg Pain and Urinary Frequency    HPI Kristy [name] is a 68 y.o. female with a history of Crohn's disease, GERD, hyperlipidemia, hypertension, diabetes, cardiomyopathy who presents to the ED for pain behind the left knee for several days with no noted redness or heat.  She has noted some swelling in the left lower extremity.  Patient also complains of burning with urination for the past 2 weeks, was recently admitted for DKA.  Discomfort is 8 out of 10.  Past Medical History:  Diagnosis Date  . Allergic rhinitis   . Allergy   . Cataract    mild   . Crohn's disease ([place]) 10/13/2013  . Diverticulosis   . GERD (gastroesophageal reflux disease)   . Gout   . HFrEF (heart failure with reduced ejection fraction) ([place])    a. 12/2008 Cath: EF 45% w/ inf HK; b. 07/2009 Echo: EF 50-55%; c. 08/2018 Echo: EF 20-25%.  Marland Kitchen Hyperlipidemia   . Hypertension   . IBS (irritable bowel syndrome)   . Left bundle branch block   . Neuromuscular disorder (HCC)    neuropathy  . NICM (nonischemic cardiomyopathy) ([place])    a. 12/2008 Cath: no significant dzs, EF 45% w/ inf HK->Med Rx; b. 07/2009 Echo: EF 50-55%; c. 08/2018 Echo: EF 20-25%, ant/antsept HK, mild MR, mildly dil LA, nl RV fx; d. 08/2018 Cath: D1 80, otw nonobs dzs->Med rx.  . Non-obstructive CAD (coronary artery disease)    a. 12/2008 Cath: no significant dzs, EF 45% w/ inf HK->Med Rx; b. 08/2018 Cath: LM nl, LAD min irregs, D1 80, LCX 37md, RCA min irregs->Med Rx.  . Osteoarthritis   . Poorly controlled Diabetes mellitus    a. 07/2018 A1c 13.1.  .Marland KitchenSymptomatic cholelithiasis   . Uncontrolled type 2 diabetes mellitus with stage 3 chronic kidney disease, with  long-term current use of insulin ([facility]          Patient Active Problem List   Diagnosis Date Noted  . Elevated troponin   . DKA (diabetic ketoacidoses) ([place] 04/19/2019  . Pneumonia 04/19/2019  . NICM (nonischemic cardiomyopathy) ([place] 11/08/2018  . Acute on chronic HFrEF (heart failure with reduced ejection fraction) ([place] 08/22/2018  . Coronary artery disease, non-occlusive 09/13/2016  . Primary osteoarthritis involving multiple joints 03/14/2015  . Personal history of other malignant neoplasm of skin 12/01/2014  . Crohn's disease ([place] 10/13/2013  . Major depressive disorder, recurrent, in full remission ([place] 05/01/2011  . TRANSAMINASES, SERUM, ELEVATED 05/01/2010  . PSORIASIS 08/02/2009  . Hyperlipidemia 05/11/2009  . GOUT 02/05/2009  . Uncontrolled type 2 diabetes mellitus with stage 3 chronic kidney disease, with long-term current use of insulin ([place]   . Essential hypertension 04/18/2007  . LBBB (left bundle branch block) 04/18/2007  . ALLERGIC RHINITIS 04/18/2007    Past Surgical History:  Procedure Laterality Date  . BREAST BIOPSY Right 06/24/2019   stereo bx/ x clip/ path pending  . BREAST CYST ASPIRATION    . COLONOSCOPY    . DILATION AND CURETTAGE OF UTERUS    . LEFT HEART CATH AND CORONARY ANGIOGRAPHY N/A 04/24/2019   Procedure: LEFT HEART CATH AND CORONARY ANGIOGRAPHY;  Surgeon: ENelva Bush MD;  [facility];  Service: Cardiovascular;  Laterality: N/A;  .  RIGHT/LEFT HEART CATH AND CORONARY ANGIOGRAPHY N/A 08/26/2018   Procedure: RIGHT/LEFT HEART CATH AND CORONARY ANGIOGRAPHY;  Surgeon: Wellington Hampshire, MD;  Location: [facility];  Service: Cardiovascular;  Laterality: N/A;  . SHOULDER SURGERY     15 + yrs ago   . SKIN SURGERY     nose   . VAGINAL HYSTERECTOMY      Allergies Fish oil, Glimepiride, Guanfacine hcl, and Rosiglitazone  Social History Social History   Tobacco Use  . Smoking status: Former Smoker     Packs/day: 0.75    Years: 30.00    Pack years: 22.50    Types: Cigarettes    Quit date: 06/06/1997    Years since quitting: 22.0  . Smokeless tobacco: Never Used  Substance Use Topics  . Alcohol use: No  . Drug use: No   Review of Systems Constitutional: Negative for fever. Cardiovascular: Negative for chest pain. Respiratory: Negative for shortness of breath. Gastrointestinal: Negative for abdominal pain, vomiting and diarrhea. Genitourinary: Positive for dysuria Musculoskeletal: Positive for left lower extremity pain Skin: Negative for rash. Neurological: Negative for headaches, focal weakness or numbness.  All systems negative/normal/unremarkable except as stated in the HPI  ____________________________________________   PHYSICAL EXAM:  VITAL SIGNS: ED Triage Vitals  Enc Vitals Group     BP 07/03/19 1037 128/65     Pulse Rate 07/03/19 1037 92     Resp 07/03/19 1037 17     Temp 07/03/19 1037 98.2 F (36.8 C)     Temp Source 07/03/19 1037 Oral     SpO2 07/03/19 1037 96 %     Weight 07/03/19 1038 155 lb (70.3 kg)     Height 07/03/19 1038 5' 3"  (1.6 m)     Head Circumference --      Peak Flow --      Pain Score 07/03/19 1038 8     Pain Loc --      Pain Edu? --      Excl. in [place]? --    Constitutional: Alert and oriented. Well appearing and in no distress. Eyes: Conjunctivae are normal. Normal extraocular movements. Cardiovascular: Normal rate, regular rhythm. No murmurs, rubs, or gallops.  Good peripheral pulses Respiratory: Normal respiratory effort without tachypnea nor retractions. Breath sounds are clear and equal bilaterally. No wheezes/rales/rhonchi. Gastrointestinal: Soft and nontender. Normal bowel sounds Musculoskeletal: Mild left lower extremity edema, no erythema is noted Neurologic:  Normal speech and language. No gross focal neurologic deficits are appreciated.  Skin:  Skin is warm, dry and intact. No rash noted. Psychiatric: Mood and affect are normal.  Speech and behavior are normal.  ___________________________________________  ED COURSE:  As part of my medical decision making, I reviewed the following data within the [place] History obtained from family if available, nursing notes, old chart and ekg, as well as notes from prior ED visits. Patient presented for left leg pain and dysuria, we will assess with labs and imaging as indicated at this time.   Procedures  Kristy [name] was evaluated in Emergency Department on 07/03/2019 for the symptoms described in the history of present illness. She was evaluated in the context of the global COVID-19 pandemic, which necessitated consideration that the patient might be at risk for infection with the SARS-CoV-2 virus that causes COVID-19. Institutional protocols and algorithms that pertain to the evaluation of patients at risk for COVID-19 are in a state of rapid change based on information released by regulatory bodies including  the [employer] and federal and state organizations. These policies and algorithms were followed during the patient's care in the ED.  ____________________________________________   LABS (pertinent positives/negatives)  Labs Reviewed  URINALYSIS, COMPLETE (UACMP) WITH MICROSCOPIC - Abnormal; Notable for the following components:      Result Value   Color, Urine YELLOW (*)    APPearance HAZY (*)    Glucose, UA >=500 (*)    Leukocytes,Ua SMALL (*)    All other components within normal limits  GLUCOSE, CAPILLARY - Abnormal; Notable for the following components:   Glucose-Capillary 503 (*)    All other components within normal limits  CBC WITH DIFFERENTIAL/PLATELET - Abnormal; Notable for the following components:   WBC 14.1 (*)    Hemoglobin 11.9 (*)    Neutro Abs 9.1 (*)    Monocytes Absolute 1.7 (*)    All other components within normal limits  COMPREHENSIVE METABOLIC PANEL - Abnormal; Notable for the following components:   Sodium 132 (*)     Glucose, Bld 446 (*)    BUN 26 (*)    Creatinine, Ser 1.17 (*)    GFR calc non Af Amer 48 (*)    GFR calc Af Amer 55 (*)    All other components within normal limits  GLUCOSE, CAPILLARY - Abnormal; Notable for the following components:   Glucose-Capillary 311 (*)    All other components within normal limits  CBG MONITORING, ED    RADIOLOGY Images were viewed by me  Left lower extremity ultrasound IMPRESSION:  1. No DVT.  2. Complex fluid collection measuring 8.1 cm as detailed above. This  may represent a large dissecting Baker's cyst, hematoma, or less  likely an abscess.  3. There is nonspecific lower extremity edema.  ____________________________________________   DIFFERENTIAL DIAGNOSIS   DVT, peripheral edema, gout, UTI  FINAL ASSESSMENT AND PLAN  Baker's cyst, cystitis, hyperglycemia   Plan: The patient had presented for left leg pain and dysuria. Patient's labs initially revealed significant leukocytosis and hyperglycemia but thankfully not DKA developing.  She was given IV fluids as well as IV Rocephin and IV insulin. Patient's imaging did not reveal any DVT but did reveal a Baker's cyst.  She is cleared for outpatient follow-up on antibiotics for her UTI.   Laurence Aly, MD    Note: This note was generated in part or whole with voice recognition software. Voice recognition is usually quite accurate but there are transcription errors that can and very often do occur. I apologize for any typographical errors that were not detected and corrected.     Earleen Newport, MD 07/03/19 [phone]

## 2019-07-03 NOTE — ED Notes (Signed)
Patient transported to Ultrasound 

## 2019-07-03 NOTE — ED Notes (Signed)
Pt able to ambulate to the bedside toilet without assistance. Pt attempting to provide urine sample at this time. Urinary hat placed in toilet and sanitary wipe given to patient.

## 2019-07-03 NOTE — ED Triage Notes (Signed)
Pt c/o pain behind her left knee for several days, no noted redness or heat. Pt also c/o burning with urination for the past 2 weeks. Was recently admitted for DKA. Pt is a/ox4 on arrival in NAD.

## 2019-07-04 ENCOUNTER — Telehealth: Payer: Self-pay

## 2019-07-04 NOTE — Telephone Encounter (Signed)
Appointment made

## 2019-07-04 NOTE — Telephone Encounter (Signed)
Left message for patient to call back to schedule ER visit appointment with Dr Lorelei Pont.

## 2019-07-08 ENCOUNTER — Encounter: Payer: Self-pay | Admitting: Emergency Medicine

## 2019-07-08 ENCOUNTER — Other Ambulatory Visit: Payer: Self-pay

## 2019-07-08 ENCOUNTER — Inpatient Hospital Stay
Admission: EM | Admit: 2019-07-08 | Discharge: 2019-07-14 | DRG: 638 | Disposition: A | Discharge status: Home Health | Payer: Medicare HMO | Attending: Internal Medicine | Admitting: Internal Medicine

## 2019-07-08 DIAGNOSIS — M1990 Unspecified osteoarthritis, unspecified site: Secondary | ICD-10-CM | POA: Diagnosis present

## 2019-07-08 DIAGNOSIS — Z20828 Contact with and (suspected) exposure to other viral communicable diseases: Secondary | ICD-10-CM | POA: Diagnosis present

## 2019-07-08 DIAGNOSIS — Z85828 Personal history of other malignant neoplasm of skin: Secondary | ICD-10-CM | POA: Diagnosis not present

## 2019-07-08 DIAGNOSIS — R531 Weakness: Secondary | ICD-10-CM

## 2019-07-08 DIAGNOSIS — I110 Hypertensive heart disease with heart failure: Secondary | ICD-10-CM | POA: Diagnosis present

## 2019-07-08 DIAGNOSIS — E1110 Type 2 diabetes mellitus with ketoacidosis without coma: Secondary | ICD-10-CM | POA: Diagnosis not present

## 2019-07-08 DIAGNOSIS — Z794 Long term (current) use of insulin: Secondary | ICD-10-CM | POA: Diagnosis not present

## 2019-07-08 DIAGNOSIS — I428 Other cardiomyopathies: Secondary | ICD-10-CM | POA: Diagnosis present

## 2019-07-08 DIAGNOSIS — E785 Hyperlipidemia, unspecified: Secondary | ICD-10-CM | POA: Diagnosis present

## 2019-07-08 DIAGNOSIS — Z8249 Family history of ischemic heart disease and other diseases of the circulatory system: Secondary | ICD-10-CM | POA: Diagnosis not present

## 2019-07-08 DIAGNOSIS — I5022 Chronic systolic (congestive) heart failure: Secondary | ICD-10-CM | POA: Diagnosis present

## 2019-07-08 DIAGNOSIS — M25462 Effusion, left knee: Secondary | ICD-10-CM | POA: Diagnosis present

## 2019-07-08 DIAGNOSIS — Z7902 Long term (current) use of antithrombotics/antiplatelets: Secondary | ICD-10-CM

## 2019-07-08 DIAGNOSIS — M7120 Synovial cyst of popliteal space [Baker], unspecified knee: Secondary | ICD-10-CM | POA: Diagnosis present

## 2019-07-08 DIAGNOSIS — F329 Major depressive disorder, single episode, unspecified: Secondary | ICD-10-CM | POA: Diagnosis present

## 2019-07-08 DIAGNOSIS — N390 Urinary tract infection, site not specified: Secondary | ICD-10-CM | POA: Diagnosis present

## 2019-07-08 DIAGNOSIS — W19XXXA Unspecified fall, initial encounter: Secondary | ICD-10-CM | POA: Diagnosis not present

## 2019-07-08 DIAGNOSIS — Z7982 Long term (current) use of aspirin: Secondary | ICD-10-CM | POA: Diagnosis not present

## 2019-07-08 DIAGNOSIS — E11649 Type 2 diabetes mellitus with hypoglycemia without coma: Secondary | ICD-10-CM | POA: Diagnosis not present

## 2019-07-08 DIAGNOSIS — M109 Gout, unspecified: Secondary | ICD-10-CM | POA: Diagnosis present

## 2019-07-08 DIAGNOSIS — K5090 Crohn's disease, unspecified, without complications: Secondary | ICD-10-CM | POA: Diagnosis present

## 2019-07-08 DIAGNOSIS — E1165 Type 2 diabetes mellitus with hyperglycemia: Secondary | ICD-10-CM | POA: Diagnosis not present

## 2019-07-08 DIAGNOSIS — R739 Hyperglycemia, unspecified: Secondary | ICD-10-CM

## 2019-07-08 DIAGNOSIS — J309 Allergic rhinitis, unspecified: Secondary | ICD-10-CM | POA: Diagnosis present

## 2019-07-08 DIAGNOSIS — Z888 Allergy status to other drugs, medicaments and biological substances status: Secondary | ICD-10-CM | POA: Diagnosis not present

## 2019-07-08 DIAGNOSIS — E860 Dehydration: Secondary | ICD-10-CM | POA: Diagnosis not present

## 2019-07-08 DIAGNOSIS — I10 Essential (primary) hypertension: Secondary | ICD-10-CM | POA: Diagnosis not present

## 2019-07-08 DIAGNOSIS — M7122 Synovial cyst of popliteal space [Baker], left knee: Secondary | ICD-10-CM | POA: Diagnosis not present

## 2019-07-08 DIAGNOSIS — Z91048 Other nonmedicinal substance allergy status: Secondary | ICD-10-CM | POA: Diagnosis not present

## 2019-07-08 DIAGNOSIS — Z79899 Other long term (current) drug therapy: Secondary | ICD-10-CM | POA: Diagnosis not present

## 2019-07-08 DIAGNOSIS — Z87891 Personal history of nicotine dependence: Secondary | ICD-10-CM

## 2019-07-08 DIAGNOSIS — M10462 Other secondary gout, left knee: Secondary | ICD-10-CM

## 2019-07-08 LAB — URINALYSIS, COMPLETE (UACMP) WITH MICROSCOPIC
Bacteria, UA: NONE SEEN
Bilirubin Urine: NEGATIVE
Glucose, UA: >=500 mg/dL — AB
Hgb urine dipstick: NEGATIVE
Ketones, ur: 20 mg/dL — AB
Nitrite: NEGATIVE
Protein, ur: 30 mg/dL — AB
Specific Gravity, Urine: 1.017 (ref 1.005–1.030)
WBC, UA: >50 WBC/hpf — ABNORMAL HIGH (ref 0–5)
pH: 6 (ref 5.0–8.0)

## 2019-07-08 LAB — BASIC METABOLIC PANEL
Anion gap: 15 (ref 5–15)
BUN: 25 mg/dL — ABNORMAL HIGH (ref 8–23)
CO2: 20 mmol/L — ABNORMAL LOW (ref 22–32)
Calcium: 9.8 mg/dL (ref 8.9–10.3)
Chloride: 97 mmol/L — ABNORMAL LOW (ref 98–111)
Creatinine, Ser: 1.28 mg/dL — ABNORMAL HIGH (ref 0.44–1.00)
GFR calc Af Amer: 50 mL/min — ABNORMAL LOW (ref >60)
GFR calc non Af Amer: 43 mL/min — ABNORMAL LOW (ref >60)
Glucose, Bld: 480 mg/dL — ABNORMAL HIGH (ref 70–99)
Potassium: 3.7 mmol/L (ref 3.5–5.1)
Sodium: 132 mmol/L — ABNORMAL LOW (ref 135–145)

## 2019-07-08 LAB — HEMOGLOBIN A1C
Hgb A1c MFr Bld: 10.9 % — ABNORMAL HIGH (ref 4.8–5.6)
Mean Plasma Glucose: 266.13 mg/dL

## 2019-07-08 LAB — CBC
HCT: 37.2 % (ref 36.0–46.0)
Hemoglobin: 12.2 g/dL (ref 12.0–15.0)
MCH: 29.7 pg (ref 26.0–34.0)
MCHC: 32.8 g/dL (ref 30.0–36.0)
MCV: 90.5 fL (ref 80.0–100.0)
Platelets: 406 10*3/uL — ABNORMAL HIGH (ref 150–400)
RBC: 4.11 MIL/uL (ref 3.87–5.11)
RDW: 12.4 % (ref 11.5–15.5)
WBC: 14.6 10*3/uL — ABNORMAL HIGH (ref 4.0–10.5)
nRBC: 0 % (ref 0.0–0.2)

## 2019-07-08 LAB — BLOOD GAS, VENOUS
Acid-base deficit: 6.3 mmol/L — ABNORMAL HIGH (ref 0.0–2.0)
Bicarbonate: 19.7 mmol/L — ABNORMAL LOW (ref 20.0–28.0)
O2 Saturation: 15.4 %
Patient temperature: 37
pCO2, Ven: 40 mmHg — ABNORMAL LOW (ref 44.0–60.0)
pH, Ven: 7.3 (ref 7.250–7.430)
pO2, Ven: <31 mmHg — CL (ref 32.0–45.0)

## 2019-07-08 LAB — GLUCOSE, CAPILLARY
Glucose-Capillary: 463 mg/dL — ABNORMAL HIGH (ref 70–99)
Glucose-Capillary: 488 mg/dL — ABNORMAL HIGH (ref 70–99)
Glucose-Capillary: 393 mg/dL — ABNORMAL HIGH (ref 70–99)
Glucose-Capillary: 299 mg/dL — ABNORMAL HIGH (ref 70–99)
Glucose-Capillary: 335 mg/dL — ABNORMAL HIGH (ref 70–99)

## 2019-07-08 LAB — TROPONIN I (HIGH SENSITIVITY): Troponin I (High Sensitivity): 15 ng/L (ref <18)

## 2019-07-08 MED ORDER — ONDANSETRON HCL 4 MG/2ML IJ SOLN: 4.0000 mg | Freq: Four times a day (QID) | INTRAMUSCULAR | Status: DC | PRN

## 2019-07-08 MED ORDER — SODIUM CHLORIDE 0.9 % IV SOLN
1.0000 g | Freq: Once | INTRAVENOUS | Status: AC
  Administered 2019-07-08: 1 g via INTRAVENOUS
  Filled 2019-07-08: qty 10

## 2019-07-08 MED ORDER — SODIUM CHLORIDE 0.9 % IV SOLN
1.0000 g | INTRAVENOUS | Status: AC
  Administered 2019-07-09 – 2019-07-10 (×2): 1 g via INTRAVENOUS
  Filled 2019-07-08: qty 1
  Filled 2019-07-08: qty 10

## 2019-07-08 MED ORDER — SPIRONOLACTONE 25 MG PO TABS
25.0000 mg | ORAL_TABLET | Freq: Every day | ORAL | Status: DC
  Administered 2019-07-09 – 2019-07-14 (×6): 25 mg via ORAL
  Filled 2019-07-08 (×6): qty 1

## 2019-07-08 MED ORDER — INSULIN ASPART 100 UNIT/ML ~~LOC~~ SOLN
0.0000 [IU] | Freq: Three times a day (TID) | SUBCUTANEOUS | Status: DC
  Administered 2019-07-09: 7 [IU] via SUBCUTANEOUS
  Administered 2019-07-09: 09:00:00 4 [IU] via SUBCUTANEOUS
  Administered 2019-07-09 – 2019-07-10 (×2): 11 [IU] via SUBCUTANEOUS
  Administered 2019-07-10 (×2): 20 [IU] via SUBCUTANEOUS
  Administered 2019-07-11: 15 [IU] via SUBCUTANEOUS
  Administered 2019-07-11 – 2019-07-12 (×3): 7 [IU] via SUBCUTANEOUS
  Administered 2019-07-12: 13:00:00 11 [IU] via SUBCUTANEOUS
  Administered 2019-07-13: 20 [IU] via SUBCUTANEOUS
  Administered 2019-07-13: 4 [IU] via SUBCUTANEOUS
  Administered 2019-07-13 – 2019-07-14 (×3): 7 [IU] via SUBCUTANEOUS
  Filled 2019-07-08 (×17): qty 1

## 2019-07-08 MED ORDER — ACETAMINOPHEN 325 MG PO TABS
650.0000 mg | ORAL_TABLET | Freq: Four times a day (QID) | ORAL | Status: DC | PRN
  Administered 2019-07-14: 650 mg via ORAL
  Filled 2019-07-08 (×2): qty 2

## 2019-07-08 MED ORDER — CLOPIDOGREL BISULFATE 75 MG PO TABS
75.0000 mg | ORAL_TABLET | Freq: Every day | ORAL | Status: DC
  Administered 2019-07-09 – 2019-07-14 (×6): 75 mg via ORAL
  Filled 2019-07-08 (×6): qty 1

## 2019-07-08 MED ORDER — ASPIRIN EC 81 MG PO TBEC
81.0000 mg | DELAYED_RELEASE_TABLET | Freq: Every day | ORAL | Status: DC
  Administered 2019-07-09 – 2019-07-14 (×6): 81 mg via ORAL
  Filled 2019-07-08 (×7): qty 1

## 2019-07-08 MED ORDER — SODIUM CHLORIDE 0.9 % IV BOLUS
1000.0000 mL | Freq: Once | INTRAVENOUS | Status: AC
  Administered 2019-07-08: 1000 mL via INTRAVENOUS

## 2019-07-08 MED ORDER — INSULIN REGULAR HUMAN (CONC) 500 UNIT/ML ~~LOC~~ SOPN: 50.0000 [IU] | PEN_INJECTOR | SUBCUTANEOUS | Status: DC

## 2019-07-08 MED ORDER — INSULIN ASPART 100 UNIT/ML ~~LOC~~ SOLN
10.0000 [IU] | Freq: Once | SUBCUTANEOUS | Status: AC
  Administered 2019-07-08: 10 [IU] via INTRAVENOUS
  Filled 2019-07-08: qty 1

## 2019-07-08 MED ORDER — IVABRADINE HCL 5 MG PO TABS
5.0000 mg | ORAL_TABLET | Freq: Two times a day (BID) | ORAL | Status: DC
  Administered 2019-07-09 – 2019-07-14 (×11): 5 mg via ORAL
  Filled 2019-07-08 (×12): qty 1

## 2019-07-08 MED ORDER — CARVEDILOL 12.5 MG PO TABS
12.5000 mg | ORAL_TABLET | Freq: Two times a day (BID) | ORAL | Status: DC
  Administered 2019-07-08 – 2019-07-14 (×12): 12.5 mg via ORAL
  Filled 2019-07-08 (×12): qty 1

## 2019-07-08 MED ORDER — SACUBITRIL-VALSARTAN 49-51 MG PO TABS
1.0000 | ORAL_TABLET | Freq: Two times a day (BID) | ORAL | Status: DC
  Administered 2019-07-09 – 2019-07-14 (×11): 1 via ORAL
  Filled 2019-07-08 (×12): qty 1

## 2019-07-08 MED ORDER — ONDANSETRON HCL 4 MG PO TABS: 4.0000 mg | ORAL_TABLET | Freq: Four times a day (QID) | ORAL | Status: DC | PRN

## 2019-07-08 MED ORDER — SODIUM CHLORIDE 0.9 % IV SOLN
INTRAVENOUS | Status: DC
  Administered 2019-07-08 – 2019-07-11 (×6): via INTRAVENOUS

## 2019-07-08 MED ORDER — INSULIN ASPART 100 UNIT/ML ~~LOC~~ SOLN
0.0000 [IU] | Freq: Every day | SUBCUTANEOUS | Status: DC
  Administered 2019-07-08: 4 [IU] via SUBCUTANEOUS
  Administered 2019-07-12: 3 [IU] via SUBCUTANEOUS
  Filled 2019-07-08 (×4): qty 1

## 2019-07-08 MED ORDER — FLUOXETINE HCL 20 MG PO CAPS
20.0000 mg | ORAL_CAPSULE | Freq: Every day | ORAL | Status: DC
  Administered 2019-07-09 – 2019-07-14 (×6): 20 mg via ORAL
  Filled 2019-07-08 (×6): qty 1

## 2019-07-08 MED ORDER — ACETAMINOPHEN 650 MG RE SUPP: 650.0000 mg | Freq: Four times a day (QID) | RECTAL | Status: DC | PRN

## 2019-07-08 MED ORDER — ALIGN 4 MG PO CAPS: 4.0000 mg | ORAL_CAPSULE | Freq: Every day | ORAL | Status: DC

## 2019-07-08 MED ORDER — ATORVASTATIN CALCIUM 20 MG PO TABS
80.0000 mg | ORAL_TABLET | Freq: Every day | ORAL | Status: DC
  Administered 2019-07-09 – 2019-07-11 (×3): 80 mg via ORAL
  Filled 2019-07-08 (×4): qty 4

## 2019-07-08 MED ORDER — ENOXAPARIN SODIUM 40 MG/0.4ML ~~LOC~~ SOLN
40.0000 mg | SUBCUTANEOUS | Status: DC
  Administered 2019-07-09 – 2019-07-13 (×5): 40 mg via SUBCUTANEOUS
  Filled 2019-07-08 (×6): qty 0.4

## 2019-07-08 MED ORDER — INSULIN REGULAR HUMAN (CONC) 500 UNIT/ML ~~LOC~~ SOPN
50.0000 [IU] | PEN_INJECTOR | Freq: Three times a day (TID) | SUBCUTANEOUS | Status: DC
  Administered 2019-07-08: 50 [IU] via SUBCUTANEOUS
  Administered 2019-07-09 – 2019-07-10 (×3): 20 [IU] via SUBCUTANEOUS
  Administered 2019-07-10: 90 [IU] via SUBCUTANEOUS
  Administered 2019-07-10 – 2019-07-11 (×2): 50 [IU] via SUBCUTANEOUS
  Administered 2019-07-11 – 2019-07-12 (×5): 20 [IU] via SUBCUTANEOUS
  Administered 2019-07-13: 50 [IU] via SUBCUTANEOUS
  Administered 2019-07-13 – 2019-07-14 (×2): 20 [IU] via SUBCUTANEOUS
  Filled 2019-07-08: qty 3

## 2019-07-08 NOTE — ED Triage Notes (Signed)
Patient presents to ED via EMS from home due to hyperglycemia. Patient reports 7 weeks ago she had a heart attack and had a cardiac cath. Denies chest pain. Patient reports needing to go to a rehab. States she has been having difficulty ambulating due to frequent falls.

## 2019-07-08 NOTE — ED Notes (Signed)
Pt placed on cardiac monitor. MD Paduchowski at bedside.

## 2019-07-08 NOTE — ED Notes (Signed)
Assisted patient with ambulation from the bed to the toilet then back to the bed.

## 2019-07-08 NOTE — TOC Initial Note (Signed)
Transition of Care [facility]) - Initial/Assessment Note    Patient Details  Name: Kristy [name] MRN: [id number] Date of Birth: 03/17/1951  Transition [facility]) CM/SW Contact:    Fredric Mare, LCSW Phone Number: 07/08/2019, 6:02 PM  Clinical Narrative:                  Patient is a 68 year old female that presents to the ED for hyperglycemia. Patient states that she has been experiencing frequent falls; she has fell approximately 18 times within the past year. Patient reported that she was unable to get in and out of her bed, and difficulty ambulating. Patent shared that she had a bakers cyst last Thursday and that made it difficult to walk as well.  Patient reports that her niece is a Marine scientist and has been checking up on her often. Patient states that she has enough money to obtain care at home but would not mind going to a nursing home if necessary in the future.  Patient would like to go to a rehab facility. CSW informed EDP.  CSW awaiting PT recommendations.      Expected Discharge Plan: Skilled Nursing Facility Barriers to Discharge: Continued Medical Work up   Patient Goals and CMS Choice        Expected Discharge Plan and Services Expected Discharge Plan: [place]   Discharge Planning Services: CM Consult Post Acute Care Choice: [place] Living arrangements for the past 2 months: Single Family Home                                      Prior Living Arrangements/Services Living arrangements for the past 2 months: Single Family Home Lives with:: Self Patient language and need for interpreter reviewed:: Yes Do you feel safe going back to the place where you live?: Yes      Need for Family Participation in Patient Care: Yes (Comment) Care giver support system in place?: No (comment) Current home services: DME(patient uses a walker and cane) Criminal Activity/Legal Involvement Pertinent to Current Situation/Hospitalization: No -  Comment as needed  Activities of Daily Living      Permission Sought/Granted                  Emotional Assessment Appearance:: Appears stated age Attitude/Demeanor/Rapport: Self-Confident, Engaged Affect (typically observed): Accepting, Appropriate, Pleasant Orientation: : Oriented to Self, Oriented to Place, Oriented to  Time, Oriented to Situation Alcohol / Substance Use: Other (comment)(former smoker) Psych Involvement: No (comment)  Admission diagnosis:  hyperglycemia fall ems Patient Active Problem List   Diagnosis Date Noted  . Elevated troponin   . DKA (diabetic ketoacidoses) ([place]) 04/19/2019  . Pneumonia 04/19/2019  . NICM (nonischemic cardiomyopathy) ([place]) 11/08/2018  . Acute on chronic HFrEF (heart failure with reduced ejection fraction) ([place]) 08/22/2018  . Coronary artery disease, non-occlusive 09/13/2016  . Primary osteoarthritis involving multiple joints 03/14/2015  . Personal history of other malignant neoplasm of skin 12/01/2014  . Crohn's disease ([place]) 10/13/2013  . Major depressive disorder, recurrent, in full remission ([place]) 05/01/2011  . TRANSAMINASES, SERUM, ELEVATED 05/01/2010  . PSORIASIS 08/02/2009  . Hyperlipidemia 05/11/2009  . GOUT 02/05/2009  . Uncontrolled type 2 diabetes mellitus with stage 3 chronic kidney disease, with long-term current use of insulin ([place])   . Essential hypertension 04/18/2007  . LBBB (left bundle branch block) 04/18/2007  . ALLERGIC RHINITIS 04/18/2007  PCP:  Owens Loffler, MD Pharmacy:   [facility] [place] [address] [place] [address] Phone: [phone] Fax: [phone]  [place], [place] - [place] [address] Phone: [phone] Fax: [phone]  [facility] [place], [place] - [address] [place] [address] Phone: [phone] Fax:  [phone]     Social Determinants of Health ([place]) Interventions    Readmission Risk Interventions Readmission Risk Prevention Plan 04/28/2019 04/27/2019  Post Dischage Appt - Complete  Medication Screening - Complete  Transportation Screening - Complete  PCP or Specialist Appt within 5-7 Days Complete -  Home Care Screening Complete -  Medication Review (RN CM) Complete -  Some recent data might be hidden

## 2019-07-08 NOTE — ED Notes (Signed)
Room reassigned to [facility]

## 2019-07-08 NOTE — ED Provider Notes (Addendum)
[facility] Emergency Department Provider Note  Time seen: 4:40 PM  I have reviewed the triage vital signs and the nursing notes.   HISTORY  Chief Complaint Hyperglycemia   HPI Kristy [name] is a 68 y.o. female with a past medical history of gastric reflux, hypertension, hyperlipidemia,  admission 2 months ago for DKA and sepsis presents to the emergency department for generalized fatigue and weakness.  According to the patient for the past 2 weeks the patient has felt very fatigued and weak at home.  States she lives alone and has been having difficulty getting around over the past 2 weeks.  Today she states she could not get up out of her bed so she ended up calling EMS to bring her to the emergency department.  Patient denies any fever or cough.  States chronic shortness of breath but denies any acute worsening.  Denies any acute chest pain.  Denies any dysuria or hematuria.  Denies abdominal pain or chest pain.  Patient states her blood sugars have been running high.  Past Medical History:  Diagnosis Date  . Allergic rhinitis   . Allergy   . Cataract    mild   . Crohn's disease ([place]) 10/13/2013  . Diverticulosis   . GERD (gastroesophageal reflux disease)   . Gout   . HFrEF (heart failure with reduced ejection fraction) ([place])    a. 12/2008 Cath: EF 45% w/ inf HK; b. 07/2009 Echo: EF 50-55%; c. 08/2018 Echo: EF 20-25%.  Marland Kitchen Hyperlipidemia   . Hypertension   . IBS (irritable bowel syndrome)   . Left bundle branch block   . Neuromuscular disorder (HCC)    neuropathy  . NICM (nonischemic cardiomyopathy) ([place])    a. 12/2008 Cath: no significant dzs, EF 45% w/ inf HK->Med Rx; b. 07/2009 Echo: EF 50-55%; c. 08/2018 Echo: EF 20-25%, ant/antsept HK, mild MR, mildly dil LA, nl RV fx; d. 08/2018 Cath: D1 80, otw nonobs dzs->Med rx.  . Non-obstructive CAD (coronary artery disease)    a. 12/2008 Cath: no significant dzs, EF 45% w/ inf HK->Med Rx; b. 08/2018 Cath: LM nl,  LAD min irregs, D1 80, LCX 7md, RCA min irregs->Med Rx.  . Osteoarthritis   . Poorly controlled Diabetes mellitus    a. 07/2018 A1c 13.1.  .Marland KitchenSymptomatic cholelithiasis   . Uncontrolled type 2 diabetes mellitus with stage 3 chronic kidney disease, with long-term current use of insulin ([facility]          Patient Active Problem List   Diagnosis Date Noted  . Elevated troponin   . DKA (diabetic ketoacidoses) ([place] 04/19/2019  . Pneumonia 04/19/2019  . NICM (nonischemic cardiomyopathy) ([place] 11/08/2018  . Acute on chronic HFrEF (heart failure with reduced ejection fraction) ([place] 08/22/2018  . Coronary artery disease, non-occlusive 09/13/2016  . Primary osteoarthritis involving multiple joints 03/14/2015  . Personal history of other malignant neoplasm of skin 12/01/2014  . Crohn's disease ([place] 10/13/2013  . Major depressive disorder, recurrent, in full remission ([place] 05/01/2011  . TRANSAMINASES, SERUM, ELEVATED 05/01/2010  . PSORIASIS 08/02/2009  . Hyperlipidemia 05/11/2009  . GOUT 02/05/2009  . Uncontrolled type 2 diabetes mellitus with stage 3 chronic kidney disease, with long-term current use of insulin ([place]   . Essential hypertension 04/18/2007  . LBBB (left bundle branch block) 04/18/2007  . ALLERGIC RHINITIS 04/18/2007    Past Surgical History:  Procedure Laterality Date  . BREAST BIOPSY Right 06/24/2019   stereo bx/ x clip/ path pending  .  BREAST CYST ASPIRATION    . COLONOSCOPY    . DILATION AND CURETTAGE OF UTERUS    . LEFT HEART CATH AND CORONARY ANGIOGRAPHY N/A 04/24/2019   Procedure: LEFT HEART CATH AND CORONARY ANGIOGRAPHY;  Surgeon: Nelva Bush, MD;  Location: [facility];  Service: Cardiovascular;  Laterality: N/A;  . RIGHT/LEFT HEART CATH AND CORONARY ANGIOGRAPHY N/A 08/26/2018   Procedure: RIGHT/LEFT HEART CATH AND CORONARY ANGIOGRAPHY;  Surgeon: Wellington Hampshire, MD;  Location: [facility];  Service: Cardiovascular;  Laterality: N/A;  .  SHOULDER SURGERY     15 + yrs ago   . SKIN SURGERY     nose   . VAGINAL HYSTERECTOMY      Prior to Admission medications   Medication Sig Start Date End Date Taking? Authorizing Provider  acetaminophen (TYLENOL) 325 MG tablet Take 325-650 mg by mouth daily as needed for moderate pain or headache.     [provider]  aspirin EC 81 MG EC tablet Take 1 tablet (81 mg total) by mouth daily. 04/29/19   Hillary Bow, MD  atorvastatin (LIPITOR) 80 MG tablet Take 1 tablet (80 mg total) by mouth daily at 6 PM. 05/19/19   Gollan, Kathlene November, MD  carvedilol (COREG) 12.5 MG tablet Take 1 tablet (12.5 mg total) by mouth 2 (two) times daily. 06/11/19 09/09/19  Minna Merritts, MD  cephALEXin (KEFLEX) 500 MG capsule Take 1 capsule (500 mg total) by mouth 4 (four) times daily for 10 days. 07/03/19 07/13/19  Earleen Newport, MD  chlorpheniramine (CHLOR-TRIMETON) 4 MG tablet Take 4 mg by mouth daily as needed for allergies.    [provider]  clopidogrel (PLAVIX) 75 MG tablet Take 1 tablet (75 mg total) by mouth daily with breakfast. 06/11/19   Gollan, Kathlene November, MD  CORLANOR 5 MG TABS tablet TAKE ONE TABLET TWICE DAILY WITH MEALS Patient taking differently: Take 5 mg by mouth 2 (two) times daily with a meal.  05/12/19   Theora Gianotti, NP  ENTRESTO 49-51 MG TAKE ONE TABLET TWICE DAILY STARTING SUNDAY 09/15/18 Patient taking differently: Take 1 tablet by mouth 2 (two) times daily.  05/05/19   Minna Merritts, MD  FLUoxetine (PROZAC) 20 MG capsule Take 1 capsule (20 mg total) by mouth daily. 02/12/19   Copland, [name], MD  glucose blood (ONETOUCH ULTRA) test strip Use to check blood sugar up to 8 times a day. 06/25/19   Copland, [name], MD  insulin regular human CONCENTRATED (HUMULIN R U-500 KWIKPEN) 500 UNIT/ML kwikpen Inject 50-100 Units into the skin See admin instructions. Inject under the skin based upon glucose readings: < 200: 0u 300 - 400: 50u 401 - 500: 90u > 500: 100u     [provider]  Insulin Syringe-Needle U-100 (B-D INS SYRINGE 0.5CC/31GX5/16) 31G X 5/16" 0.5 ML MISC USE AS DIRECTED THREE TIMES A DAY 08/06/17   Copland, [name], MD  metroNIDAZOLE (METROGEL) 0.75 % gel Apply 1 application topically daily as needed (acne).    [provider]  NONFORMULARY OR COMPOUNDED ITEM See pharmacy note Patient taking differently: Apply 1 application topically at bedtime as needed (neuropathy). Peripheral Neuropathy Cream-  Bupivacaine 1%, Doxepin 3%, Gabapentin 6%, Pentoxifylline 3%, Topiramate 1%  Apply 1-2 grams to affected area 3-4 times daily  Qty. 120 gm 07/20/17   Edrick Kins, DPM  Probiotic Product (ALIGN) 4 MG CAPS Take 4 mg by mouth daily.     [provider]  spironolactone (ALDACTONE)  25 MG tablet Take 1 tablet (25 mg total) by mouth daily. 04/29/19   Hillary Bow, MD    Allergies  Allergen Reactions  . Fish Oil Other (See Comments)    Gout  . Glimepiride     REACTION: hypoglycemia  . Guanfacine Hcl     REACTION: unspecified  . Rosiglitazone Other (See Comments)    CHF    Family History  Problem Relation Age of Onset  . Hypertension Father   . Colon cancer Neg Hx   . Colon polyps Neg Hx   . Rectal cancer Neg Hx   . Stomach cancer Neg Hx   . Breast cancer Neg Hx     Social History Social History   Tobacco Use  . Smoking status: Former Smoker    Packs/day: 0.75    Years: 30.00    Pack years: 22.50    Types: Cigarettes    Quit date: 06/06/1997    Years since quitting: 22.1  . Smokeless tobacco: Never Used  Substance Use Topics  . Alcohol use: No  . Drug use: No    Review of Systems Constitutional: Negative for fever.  Positive for generalized weakness. Cardiovascular: Negative for chest pain. Respiratory: Negative for shortness of breath. Gastrointestinal: Negative for abdominal pain Genitourinary: Negative for urinary compaints Musculoskeletal: Negative for musculoskeletal complaints Neurological:  Negative for headache All other ROS negative  ____________________________________________   PHYSICAL EXAM:  VITAL SIGNS: ED Triage Vitals [07/08/19 1454]  Enc Vitals Group     BP (!) 151/77     Pulse Rate 100     Resp 17     Temp 98.8 F (37.1 C)     Temp Source Oral     SpO2 99 %     Weight 150 lb (68 kg)     Height 5' 3"  (1.6 m)     Head Circumference      Peak Flow      Pain Score 8     Pain Loc      Pain Edu?      Excl. in [place]?    Constitutional: Alert and oriented. Well appearing and in no distress. Eyes: Normal exam ENT      Head: Normocephalic and atraumatic.      Mouth/Throat: Mucous membranes are moist. Cardiovascular: Normal rate, regular rhythm.  Respiratory: Normal respiratory effort without tachypnea nor retractions. Breath sounds are clear  Gastrointestinal: Soft and nontender. No distention.  Musculoskeletal: Nontender with normal range of motion in all extremities. Neurologic:  Normal speech and language. No gross focal neurologic deficits Skin:  Skin is warm, dry and intact.  Psychiatric: Mood and affect are normal.     INITIAL IMPRESSION / ASSESSMENT AND PLAN / ED COURSE  Pertinent labs & imaging results that were available during my care of the patient were reviewed by me and considered in my medical decision making (see chart for details).   Patient presents emergency department for high blood sugar generalized fatigue and weakness.  Patient has been having high blood sugars, history of DKA approximately 2 months ago.  We will check labs, VBG, IV hydrate and continue to closely monitor.  Differential this time would include electrolyte or metabolic abnormality, dehydration, DKA, ACS.  Patient's labs are resulted showing hyperglycemia with a borderline anion gap of 15, borderline VBG pH of 7.3.  Patient's urinalysis is now resulted consistent with urinary tract infection/cystitis.  We will order IV Rocephin.  We will dose IV insulin, continue to dose  IV fluids and admit to the hospital service for further treatment.  Patient agreeable to plan of care.  KEONDRIA SIEVER was evaluated in Emergency Department on 07/08/2019 for the symptoms described in the history of present illness. She was evaluated in the context of the global COVID-19 pandemic, which necessitated consideration that the patient might be at risk for infection with the SARS-CoV-2 virus that causes COVID-19. Institutional protocols and algorithms that pertain to the evaluation of patients at risk for COVID-19 are in a state of rapid change based on information released by regulatory bodies including the CDC and federal and state organizations. These policies and algorithms were followed during the patient's care in the ED.   EKG viewed and interpreted by myself shows a normal sinus rhythm at 89 bpm with a widened QRS, left axis deviation, morphology consistent with left bundle branch block. ____________________________________________   FINAL CLINICAL IMPRESSION(S) / ED DIAGNOSES  Weakness Urinary tract infection Hyperglycemia   Harvest Dark, MD 07/08/19 Zack Seal, MD 07/21/19 [phone]

## 2019-07-08 NOTE — H&P (Signed)
[facility] NAME: Kristy [name]    MR#:  [id number]  DATE OF BIRTH:  1951-04-12  DATE OF ADMISSION:  07/08/2019  PRIMARY CARE PHYSICIAN: Owens Loffler, MD   REQUESTING/REFERRING PHYSICIAN: Dr. Harvest Dark  CHIEF COMPLAINT:   Chief Complaint  Patient presents with  . Hyperglycemia    HISTORY OF PRESENT ILLNESS:  Kristy [name]  is a 68 y.o. female with a known history of uncontrolled diabetes mellitus, hypertension, hyperlipidemia, chronic systolic CHF, history of Crohn's disease, diverticulosis, osteoarthritis, nonischemic cardiomyopathy who presents to the hospital due to feeling weak and uneasy.  Patient presented to the ER a couple days back complaining of pain in her left leg and thought to have a Baker's cyst and also noted to have a mild UTI and discharged on oral Keflex.  She says over the past few days she has become more weak and thought that she was in DKA and therefore came to the ER for further evaluation.  Patient denies any nausea or vomiting, abdominal pain, fever chills cough or any other associated symptoms.  She denies any dysuria, hematuria, urinary frequency.  Patient says usually she gets somewhat anxious and jittery which brings on DKA but on labs here in the ER she is noted to have uncontrolled diabetes with hyperglycemia but no evidence of DKA.  She was noted to have a urinary tract infection and therefore hospitalist services were contacted for admission.  Patient's COVID-19 test is still pending.  PAST MEDICAL HISTORY:   Past Medical History:  Diagnosis Date  . Allergic rhinitis   . Allergy   . Cataract    mild   . Crohn's disease ([place]) 10/13/2013  . Diverticulosis   . GERD (gastroesophageal reflux disease)   . Gout   . HFrEF (heart failure with reduced ejection fraction) ([place])    a. 12/2008 Cath: EF 45% w/ inf HK; b. 07/2009 Echo: EF 50-55%; c. 08/2018 Echo: EF 20-25%.  Marland Kitchen Hyperlipidemia   .  Hypertension   . IBS (irritable bowel syndrome)   . Left bundle branch block   . Neuromuscular disorder (HCC)    neuropathy  . NICM (nonischemic cardiomyopathy) ([place])    a. 12/2008 Cath: no significant dzs, EF 45% w/ inf HK->Med Rx; b. 07/2009 Echo: EF 50-55%; c. 08/2018 Echo: EF 20-25%, ant/antsept HK, mild MR, mildly dil LA, nl RV fx; d. 08/2018 Cath: D1 80, otw nonobs dzs->Med rx.  . Non-obstructive CAD (coronary artery disease)    a. 12/2008 Cath: no significant dzs, EF 45% w/ inf HK->Med Rx; b. 08/2018 Cath: LM nl, LAD min irregs, D1 80, LCX 74md, RCA min irregs->Med Rx.  . Osteoarthritis   . Poorly controlled Diabetes mellitus    a. 07/2018 A1c 13.1.  .Marland KitchenSymptomatic cholelithiasis   . Uncontrolled type 2 diabetes mellitus with stage 3 chronic kidney disease, with long-term current use of insulin ([place]          PAST SURGICAL HISTORY:   Past Surgical History:  Procedure Laterality Date  . BREAST BIOPSY Right 06/24/2019   stereo bx/ x clip/ path pending  . BREAST CYST ASPIRATION    . COLONOSCOPY    . DILATION AND CURETTAGE OF UTERUS    . LEFT HEART CATH AND CORONARY ANGIOGRAPHY N/A 04/24/2019   Procedure: LEFT HEART CATH AND CORONARY ANGIOGRAPHY;  Surgeon: ENelva Bush MD;  [facility];  Service: Cardiovascular;  Laterality: N/A;  . RIGHT/LEFT HEART CATH  AND CORONARY ANGIOGRAPHY N/A 08/26/2018   Procedure: RIGHT/LEFT HEART CATH AND CORONARY ANGIOGRAPHY;  Surgeon: Wellington Hampshire, MD;  Location: [facility];  Service: Cardiovascular;  Laterality: N/A;  . SHOULDER SURGERY     15 + yrs ago   . SKIN SURGERY     nose   . VAGINAL HYSTERECTOMY      SOCIAL HISTORY:   Social History   Tobacco Use  . Smoking status: Former Smoker    Packs/day: 0.75    Years: 30.00    Pack years: 22.50    Types: Cigarettes    Quit date: 06/06/1997    Years since quitting: 22.1  . Smokeless tobacco: Never Used  Substance Use Topics  . Alcohol use: No     FAMILY HISTORY:   Family History  Problem Relation Age of Onset  . Kidney failure Mother   . Hypertension Father   . Kidney failure Brother   . Colon cancer Neg Hx   . Colon polyps Neg Hx   . Rectal cancer Neg Hx   . Stomach cancer Neg Hx   . Breast cancer Neg Hx     DRUG ALLERGIES:   Allergies  Allergen Reactions  . Fish Oil Other (See Comments)    Gout  . Glimepiride     REACTION: hypoglycemia  . Guanfacine Hcl     REACTION: unspecified  . Rosiglitazone Other (See Comments)    CHF    REVIEW OF SYSTEMS:   Review of Systems  Constitutional: Negative for chills, fever and weight loss.  HENT: Negative for congestion, nosebleeds and tinnitus.   Eyes: Negative for blurred vision, double vision and redness.  Respiratory: Negative for cough, hemoptysis, shortness of breath and wheezing.   Cardiovascular: Negative for chest pain, orthopnea, leg swelling and PND.  Gastrointestinal: Negative for abdominal pain, diarrhea, melena, nausea and vomiting.  Genitourinary: Negative for dysuria, hematuria and urgency.  Musculoskeletal: Negative for falls and joint pain.  Neurological: Positive for weakness (generalized). Negative for dizziness, tingling, sensory change, focal weakness, seizures and headaches.  Endo/Heme/Allergies: Negative for polydipsia. Does not bruise/bleed easily.  Psychiatric/Behavioral: Negative for depression and memory loss. The patient is not nervous/anxious.   All other systems reviewed and are negative.   MEDICATIONS AT HOME:   Prior to Admission medications   Medication Sig Start Date End Date Taking? Authorizing Provider  acetaminophen (TYLENOL) 325 MG tablet Take 325-650 mg by mouth daily as needed for moderate pain or headache.     [provider]  aspirin EC 81 MG EC tablet Take 1 tablet (81 mg total) by mouth daily. 04/29/19   Hillary Bow, MD  atorvastatin (LIPITOR) 80 MG tablet Take 1 tablet (80 mg total) by mouth daily at 6 PM. 05/19/19    Gollan, Kathlene November, MD  carvedilol (COREG) 12.5 MG tablet Take 1 tablet (12.5 mg total) by mouth 2 (two) times daily. 06/11/19 09/09/19  Minna Merritts, MD  cephALEXin (KEFLEX) 500 MG capsule Take 1 capsule (500 mg total) by mouth 4 (four) times daily for 10 days. 07/03/19 07/13/19  Earleen Newport, MD  chlorpheniramine (CHLOR-TRIMETON) 4 MG tablet Take 4 mg by mouth daily as needed for allergies.    [provider]  clopidogrel (PLAVIX) 75 MG tablet Take 1 tablet (75 mg total) by mouth daily with breakfast. 06/11/19   Gollan, Kathlene November, MD  CORLANOR 5 MG TABS tablet TAKE ONE TABLET TWICE DAILY WITH MEALS Patient taking differently: Take 5 mg by  mouth 2 (two) times daily with a meal.  05/12/19   Theora Gianotti, NP  ENTRESTO 49-51 MG TAKE ONE TABLET TWICE DAILY STARTING SUNDAY 09/15/18 Patient taking differently: Take 1 tablet by mouth 2 (two) times daily.  05/05/19   Minna Merritts, MD  FLUoxetine (PROZAC) 20 MG capsule Take 1 capsule (20 mg total) by mouth daily. 02/12/19   Copland, [name], MD  glucose blood (ONETOUCH ULTRA) test strip Use to check blood sugar up to 8 times a day. 06/25/19   Copland, [name], MD  insulin regular human CONCENTRATED (HUMULIN R U-500 KWIKPEN) 500 UNIT/ML kwikpen Inject 50-100 Units into the skin See admin instructions. Inject under the skin based upon glucose readings: < 200: 0u 300 - 400: 50u 401 - 500: 90u > 500: 100u    [provider]  Insulin Syringe-Needle U-100 (B-D INS SYRINGE 0.5CC/31GX5/16) 31G X 5/16" 0.5 ML MISC USE AS DIRECTED THREE TIMES A DAY 08/06/17   Copland, [name], MD  metroNIDAZOLE (METROGEL) 0.75 % gel Apply 1 application topically daily as needed (acne).    [provider]  NONFORMULARY OR COMPOUNDED ITEM See pharmacy note Patient taking differently: Apply 1 application topically at bedtime as needed (neuropathy). Peripheral Neuropathy Cream-  Bupivacaine 1%, Doxepin 3%, Gabapentin 6%, Pentoxifylline 3%,  Topiramate 1%  Apply 1-2 grams to affected area 3-4 times daily  Qty. 120 gm 07/20/17   Edrick Kins, DPM  Probiotic Product (ALIGN) 4 MG CAPS Take 4 mg by mouth daily.     [provider]  spironolactone (ALDACTONE) 25 MG tablet Take 1 tablet (25 mg total) by mouth daily. 04/29/19   Hillary Bow, MD      VITAL SIGNS:  Blood pressure (!) 146/85, pulse 90, temperature 98.8 F (37.1 C), temperature source Oral, resp. rate 13, height 5' 3"  (1.6 m), weight 68 kg, SpO2 98 %.  PHYSICAL EXAMINATION:  Physical Exam  GENERAL:  68 y.o.-year-old patient lying in the bed in no acute distress.  EYES: Pupils equal, round, reactive to light and accommodation. No scleral icterus. Extraocular muscles intact.  HEENT: Head atraumatic, normocephalic. Oropharynx and nasopharynx clear. No oropharyngeal erythema, moist oral mucosa  NECK:  Supple, no jugular venous distention. No thyroid enlargement, no tenderness.  LUNGS: Normal breath sounds bilaterally, no wheezing, rales, rhonchi. No use of accessory muscles of respiration.  CARDIOVASCULAR: S1, S2 RRR. No murmurs, rubs, gallops, clicks.  ABDOMEN: Soft, nontender, nondistended. Bowel sounds present. No organomegaly or mass.  EXTREMITIES: No pedal edema, cyanosis, or clubbing. + 2 pedal & radial pulses b/l.   NEUROLOGIC: Cranial nerves II through XII are intact. No focal Motor or sensory deficits appreciated b/l. Globally weak.  PSYCHIATRIC: The patient is alert and oriented x 3.  SKIN: No obvious rash, lesion, or ulcer.   LABORATORY PANEL:   CBC Recent Labs  Lab 07/08/19 1502  WBC 14.6*  HGB 12.2  HCT 37.2  PLT 406*   ------------------------------------------------------------------------------------------------------------------  Chemistries  Recent Labs  Lab 07/03/19 1152 07/08/19 1502  NA 132* 132*  K 4.1 3.7  CL 98 97*  CO2 24 20*  GLUCOSE 446* 480*  BUN 26* 25*  CREATININE 1.17* 1.28*  CALCIUM 9.8 9.8  AST 18  --    ALT 12  --   ALKPHOS 73  --   BILITOT 1.0  --    ------------------------------------------------------------------------------------------------------------------  Cardiac Enzymes No results for input(s): TROPONINI in the last 168 hours. ------------------------------------------------------------------------------------------------------------------  RADIOLOGY:  No results found.   IMPRESSION  AND PLAN:    68 y.o. female with a known history of uncontrolled diabetes mellitus, hypertension, hyperlipidemia, chronic systolic CHF, history of Crohn's disease, diverticulosis, osteoarthritis, nonischemic cardiomyopathy who presents to the hospital due to feeling weak and uneasy.  1.  Uncontrolled diabetes with hyperglycemia- patient presents to the hospital with blood sugars greater than 400.  Patient's last A1c was greater than 15. - Unclear if this is secondary to noncompliance or she needs more adjustment to her insulin regimen. - At present she will continue her U500 insulin, and will also place on sliding scale insulin. -Repeat her A1c, get diabetes coordinator consult.  2.  Urinary tract infection- patient was diagnosed with this a few days ago and placed on Keflex but she has become more profoundly weak. - Will place on IV ceftriaxone, follow urine cultures for now.  3.  History of chronic systolic CHF-clinically patient is not in congestive heart failure presently. - Continue Entresto, carvedilol, Aldactone, Corlanor.  4.  Depression-continue Prozac  5.  Hyperlipidemia-continue atorvastatin.  6.  Generalized weakness-secondary to underlying deconditioning also underlying UTI is noticed. -We will treat the patient with IV fluids, give IV antibiotics for UTI.  Get PT eval to assess mobility.  All the records are reviewed and case discussed with ED provider. Management plans discussed with the patient, family and they are in agreement.  CODE STATUS: Full code  TOTAL TIME  TAKING CARE OF THIS PATIENT: 45 minutes.    Henreitta Leber M.D on 07/08/2019 at 7:19 PM  Between 7am to 6pm - Pager - [phone]  After 6pm go to [email or web address] - password [email or web address] [facility]  Office  [phone]  CC: Primary care physician; Owens Loffler, MD

## 2019-07-08 NOTE — ED Notes (Signed)
ED TO INPATIENT HANDOFF REPORT  ED Nurse Name and Phone #:  Anson Crofts Name/Age/Gender [name] 68 y.o. female Room/Bed: ED25A/ED25A  Code Status   Code Status: Prior  Home/SNF/Other Home Patient oriented to: self, place, time and situation Is this baseline? Yes   Triage Complete: Triage complete  Chief Complaint hyperglycemia fall ems  Triage Note Patient presents to ED via EMS from home due to hyperglycemia. Patient reports 7 weeks ago she had a heart attack and had a cardiac cath. Denies chest pain. Patient reports needing to go to a rehab. States she has been having difficulty ambulating due to frequent falls.    Allergies Allergies  Allergen Reactions  . Fish Oil Other (See Comments)    Gout  . Glimepiride     REACTION: hypoglycemia  . Guanfacine Hcl     REACTION: unspecified  . Rosiglitazone Other (See Comments)    CHF    Level of Care/Admitting Diagnosis ED Disposition    ED Disposition Condition [facility] Area: [place] [100120]  Level of Care: Med-Surg [16]  Covid Evaluation: Asymptomatic Screening Protocol (No Symptoms)  Diagnosis: UTI (urinary tract infection) [403474]  Admitting Physician: Henreitta Leber [[id number]]  Attending Physician: Henreitta Leber [[id number]]  Estimated length of stay: past midnight tomorrow  Certification:: I certify this patient will need inpatient services for at least 2 midnights  PT Class (Do Not Modify): Inpatient [101]  PT Acc Code (Do Not Modify): Private [1]       B Medical/Surgery History Past Medical History:  Diagnosis Date  . Allergic rhinitis   . Allergy   . Cataract    mild   . Crohn's disease ([place]) 10/13/2013  . Diverticulosis   . GERD (gastroesophageal reflux disease)   . Gout   . HFrEF (heart failure with reduced ejection fraction) ([place])    a. 12/2008 Cath: EF 45% w/ inf HK; b. 07/2009 Echo: EF 50-55%; c. 08/2018 Echo: EF 20-25%.  Marland Kitchen Hyperlipidemia   .  Hypertension   . IBS (irritable bowel syndrome)   . Left bundle branch block   . Neuromuscular disorder (HCC)    neuropathy  . NICM (nonischemic cardiomyopathy) ([place])    a. 12/2008 Cath: no significant dzs, EF 45% w/ inf HK->Med Rx; b. 07/2009 Echo: EF 50-55%; c. 08/2018 Echo: EF 20-25%, ant/antsept HK, mild MR, mildly dil LA, nl RV fx; d. 08/2018 Cath: D1 80, otw nonobs dzs->Med rx.  . Non-obstructive CAD (coronary artery disease)    a. 12/2008 Cath: no significant dzs, EF 45% w/ inf HK->Med Rx; b. 08/2018 Cath: LM nl, LAD min irregs, D1 80, LCX 84md, RCA min irregs->Med Rx.  . Osteoarthritis   . Poorly controlled Diabetes mellitus    a. 07/2018 A1c 13.1.  .Marland KitchenSymptomatic cholelithiasis   . Uncontrolled type 2 diabetes mellitus with stage 3 chronic kidney disease, with long-term current use of insulin ([place]         Past Surgical History:  Procedure Laterality Date  . BREAST BIOPSY Right 06/24/2019   stereo bx/ x clip/ path pending  . BREAST CYST ASPIRATION    . COLONOSCOPY    . DILATION AND CURETTAGE OF UTERUS    . LEFT HEART CATH AND CORONARY ANGIOGRAPHY N/A 04/24/2019   Procedure: LEFT HEART CATH AND CORONARY ANGIOGRAPHY;  Surgeon: ENelva Bush MD;  [facility];  Service: Cardiovascular;  Laterality: N/A;  . RIGHT/LEFT HEART CATH AND CORONARY  ANGIOGRAPHY N/A 08/26/2018   Procedure: RIGHT/LEFT HEART CATH AND CORONARY ANGIOGRAPHY;  Surgeon: Wellington Hampshire, MD;  Location: [facility];  Service: Cardiovascular;  Laterality: N/A;  . SHOULDER SURGERY     15 + yrs ago   . SKIN SURGERY     nose   . VAGINAL HYSTERECTOMY       A IV Location/Drains/Wounds Patient Lines/Drains/Airways Status   Active Line/Drains/Airways    Name:   Placement date:   Placement time:   Site:   Days:   Peripheral IV 07/08/19 Left Antecubital   07/08/19    -    Antecubital   less than 1          Intake/Output Last 24 hours  Intake/Output Summary (Last 24 hours) at  07/08/2019 1944 Last data filed at 07/08/2019 1838 Gross per 24 hour  Intake 1000 ml  Output -  Net 1000 ml    Labs/Imaging Results for orders placed or performed during the hospital encounter of 07/08/19 (from the past 48 hour(s))  Glucose, capillary     Status: Abnormal   Collection Time: 07/08/19  2:59 PM  Result Value Ref Range   Glucose-Capillary 463 (H) 70 - 99 mg/dL  Basic metabolic panel     Status: Abnormal   Collection Time: 07/08/19  3:02 PM  Result Value Ref Range   Sodium 132 (L) 135 - 145 mmol/L   Potassium 3.7 3.5 - 5.1 mmol/L   Chloride 97 (L) 98 - 111 mmol/L   CO2 20 (L) 22 - 32 mmol/L   Glucose, Bld 480 (H) 70 - 99 mg/dL   BUN 25 (H) 8 - 23 mg/dL   Creatinine, Ser 1.28 (H) 0.44 - 1.00 mg/dL   Calcium 9.8 8.9 - 10.3 mg/dL   GFR calc non Af Amer 43 (L) >60 mL/min   GFR calc Af Amer 50 (L) >60 mL/min   Anion gap 15 5 - 15    Comment: Performed at [facility], [address] [place], [place] [address]  CBC     Status: Abnormal   Collection Time: 07/08/19  3:02 PM  Result Value Ref Range   WBC 14.6 (H) 4.0 - 10.5 K/uL   RBC 4.11 3.87 - 5.11 MIL/uL   Hemoglobin 12.2 12.0 - 15.0 g/dL   HCT 37.2 36.0 - 46.0 %   MCV 90.5 80.0 - 100.0 fL   MCH 29.7 26.0 - 34.0 pg   MCHC 32.8 30.0 - 36.0 g/dL   RDW 12.4 11.5 - 15.5 %   Platelets 406 (H) 150 - 400 K/uL   nRBC 0.0 0.0 - 0.2 %    Comment: Performed at [facility], [place], [place] [address]  Troponin I (High Sensitivity)     Status: None   Collection Time: 07/08/19  4:07 PM  Result Value Ref Range   Troponin I (High Sensitivity) 15 <18 ng/L    Comment: (NOTE) Elevated high sensitivity troponin I (hsTnI) values and significant  changes across serial measurements may suggest ACS but many other  chronic and acute conditions are known to elevate hsTnI results.  Refer to the "Links" section for chest pain algorithms and additional  guidance. Performed at [facility],  [address] [place], [place] [address]   Glucose, capillary     Status: Abnormal   Collection Time: 07/08/19  4:57 PM  Result Value Ref Range   Glucose-Capillary 488 (H) 70 - 99 mg/dL  Blood gas,  venous     Status: Abnormal   Collection Time: 07/08/19  5:08 PM  Result Value Ref Range   pH, Ven 7.30 7.250 - 7.430   pCO2, Ven 40 (L) 44.0 - 60.0 mmHg   pO2, Ven <31.0 (LL) 32.0 - 45.0 mmHg   Bicarbonate 19.7 (L) 20.0 - 28.0 mmol/L   Acid-base deficit 6.3 (H) 0.0 - 2.0 mmol/L   O2 Saturation 15.4 %   Patient temperature 37.0    Collection site VEIN    Sample type VENOUS     Comment: Performed at [facility], [address] [place], [place] [address]  Urinalysis, Complete w Microscopic     Status: Abnormal   Collection Time: 07/08/19  5:29 PM  Result Value Ref Range   Color, Urine YELLOW (A) YELLOW   APPearance TURBID (A) CLEAR   Specific Gravity, Urine 1.017 1.005 - 1.030   pH 6.0 5.0 - 8.0   Glucose, UA >=500 (A) NEGATIVE mg/dL   Hgb urine dipstick NEGATIVE NEGATIVE   Bilirubin Urine NEGATIVE NEGATIVE   Ketones, ur 20 (A) NEGATIVE mg/dL   Protein, ur 30 (A) NEGATIVE mg/dL   Nitrite NEGATIVE NEGATIVE   Leukocytes,Ua LARGE (A) NEGATIVE   RBC / HPF 0-5 0 - 5 RBC/hpf   WBC, UA >50 (H) 0 - 5 WBC/hpf   Bacteria, UA NONE SEEN NONE SEEN   Squamous Epithelial / LPF 0-5 0 - 5   WBC Clumps PRESENT    Budding Yeast PRESENT     Comment: Performed at [facility], [address] [place], [place] [address]  Glucose, capillary     Status: Abnormal   Collection Time: 07/08/19  6:04 PM  Result Value Ref Range   Glucose-Capillary 393 (H) 70 - 99 mg/dL   No results found.  Pending Labs [employer] (From admission, onward)    Start     Ordered   07/08/19 1855  Hemoglobin A1c  Add-on,   AD     07/08/19 1854   07/08/19 1847  SARS CORONAVIRUS 2 (TAT 6-24 HRS) Nasopharyngeal Nasopharyngeal Swab  (Asymptomatic/Tier 2 Patients Labs)  Once,   STAT    Question  Answer Comment  Is this test for diagnosis or screening Screening   Symptomatic for COVID-19 as defined by CDC No   Hospitalized for COVID-19 No   Admitted to ICU for COVID-19 No   Previously tested for COVID-19 Yes   Resident in a congregate (group) care setting No   Employed in healthcare setting No   Pregnant No      07/08/19 1847   07/08/19 1832  Urine Culture  Add-on,   AD     07/08/19 1832   Signed and Held  HIV Antibody (routine testing w rflx)  (HIV Antibody (Routine testing w reflex) panel)  Once,   R     Signed and Held   Signed and Held  HIV4GL Save Tube  (HIV Antibody (Routine testing w reflex) panel)  Once,   R     Signed and Held   Signed and Held  Basic metabolic panel  Tomorrow morning,   R     Signed and Held   Signed and Held  CBC  Tomorrow morning,   R     Signed and Held   Signed and Held  CBC  (enoxaparin (LOVENOX)    CrCl >/= 30 ml/min)  Once,   R    Comments: Baseline for enoxaparin therapy IF NOT ALREADY DRAWN.  Notify MD if PLT < 100 K.    Signed and Held   Signed and Held  Creatinine, serum  (enoxaparin (LOVENOX)    CrCl >/= 30 ml/min)  Once,   R    Comments: Baseline for enoxaparin therapy IF NOT ALREADY DRAWN.    Signed and Held   Signed and Held  Creatinine, serum  (enoxaparin (LOVENOX)    CrCl >/= 30 ml/min)  Weekly,   R    Comments: while on enoxaparin therapy    Signed and Held          Vitals/Pain Today's Vitals   07/08/19 1454 07/08/19 1700  BP: (!) 151/77 (!) 146/85  Pulse: 100 90  Resp: 17 13  Temp: 98.8 F (37.1 C)   TempSrc: Oral   SpO2: 99% 98%  Weight: 68 kg   Height: 5' 3"  (1.6 m)   PainSc: 8      Isolation Precautions No active isolations  Medications Medications  insulin aspart (novoLOG) injection 0-20 Units (has no administration in time range)  insulin aspart (novoLOG) injection 0-5 Units (has no administration in time range)  sodium chloride 0.9 % bolus 1,000 mL (0 mLs Intravenous Stopped 07/08/19 1838)  insulin  aspart (novoLOG) injection 10 Units (10 Units Intravenous Given 07/08/19 1658)  insulin aspart (novoLOG) injection 10 Units (10 Units Intravenous Given 07/08/19 1839)  sodium chloride 0.9 % bolus 1,000 mL (1,000 mLs Intravenous New Bag/Given 07/08/19 1839)  cefTRIAXone (ROCEPHIN) 1 g in sodium chloride 0.9 % 100 mL IVPB (1 g Intravenous New Bag/Given 07/08/19 1908)    Mobility walks High fall risk   Focused Assessments Cardiac Assessment Handoff:  Cardiac Rhythm: Sinus tachycardia Lab Results  Component Value Date   CKTOTAL 190 04/19/2019   TROPONINI 0.04 ([place]) 10/30/2018   No results found for: DDIMER Does the Patient currently have chest pain? No  , Hypoglycemia, weakness   R Recommendations: See Admitting Provider Note  Report given to:   Additional Notes:

## 2019-07-09 LAB — GLUCOSE, CAPILLARY
Glucose-Capillary: 183 mg/dL — ABNORMAL HIGH (ref 70–99)
Glucose-Capillary: 296 mg/dL — ABNORMAL HIGH (ref 70–99)
Glucose-Capillary: 239 mg/dL — ABNORMAL HIGH (ref 70–99)
Glucose-Capillary: 186 mg/dL — ABNORMAL HIGH (ref 70–99)
Glucose-Capillary: 237 mg/dL — ABNORMAL HIGH (ref 70–99)

## 2019-07-09 LAB — BASIC METABOLIC PANEL
Anion gap: 7 (ref 5–15)
BUN: 18 mg/dL (ref 8–23)
CO2: 20 mmol/L — ABNORMAL LOW (ref 22–32)
Calcium: 8.7 mg/dL — ABNORMAL LOW (ref 8.9–10.3)
Chloride: 110 mmol/L (ref 98–111)
Creatinine, Ser: 0.76 mg/dL (ref 0.44–1.00)
GFR calc Af Amer: >60 mL/min (ref >60)
GFR calc non Af Amer: >60 mL/min (ref >60)
Glucose, Bld: 238 mg/dL — ABNORMAL HIGH (ref 70–99)
Potassium: 3.5 mmol/L (ref 3.5–5.1)
Sodium: 137 mmol/L (ref 135–145)

## 2019-07-09 LAB — CBC
HCT: 31.3 % — ABNORMAL LOW (ref 36.0–46.0)
Hemoglobin: 10.3 g/dL — ABNORMAL LOW (ref 12.0–15.0)
MCH: 29.6 pg (ref 26.0–34.0)
MCHC: 32.9 g/dL (ref 30.0–36.0)
MCV: 89.9 fL (ref 80.0–100.0)
Platelets: 305 10*3/uL (ref 150–400)
RBC: 3.48 MIL/uL — ABNORMAL LOW (ref 3.87–5.11)
RDW: 12.3 % (ref 11.5–15.5)
WBC: 10.4 10*3/uL (ref 4.0–10.5)
nRBC: 0 % (ref 0.0–0.2)

## 2019-07-09 LAB — HIV ANTIBODY (ROUTINE TESTING W REFLEX): HIV Screen 4th Generation wRfx: NONREACTIVE

## 2019-07-09 LAB — SARS CORONAVIRUS 2 (TAT 6-24 HRS): SARS Coronavirus 2: NEGATIVE

## 2019-07-09 MED ORDER — BUPIVACAINE HCL (PF) 0.5 % IJ SOLN
10.0000 mL | Freq: Once | INTRAMUSCULAR | Status: AC
  Administered 2019-07-09: 16:00:00 10 mL
  Filled 2019-07-09: qty 10

## 2019-07-09 MED ORDER — TRIAMCINOLONE ACETONIDE 40 MG/ML IJ SUSP
40.0000 mg | Freq: Once | INTRAMUSCULAR | Status: AC
  Administered 2019-07-09: 16:00:00 40 mg via INTRA_ARTICULAR
  Filled 2019-07-09: qty 1

## 2019-07-09 MED ORDER — HYDROCODONE-ACETAMINOPHEN 5-325 MG PO TABS
1.0000 | ORAL_TABLET | Freq: Four times a day (QID) | ORAL | Status: DC | PRN
  Administered 2019-07-09 (×2): 2 via ORAL
  Filled 2019-07-09 (×2): qty 2

## 2019-07-09 NOTE — Op Note (Signed)
Preprocedure diagnosis is left knee effusion possible gout Post procedure diagnosis same  Procedure: Left knee aspiration and injection  Anesthesia: Local  Description of procedure: After informed consent had been obtained and appropriate patient identification and timeout procedures were completed superior lateral aspect of the knee was prepped with alcohol swab and 3 cc of half percent Sensorcaine was infiltrated in subcutaneous tissue.  After allowing that to set up the skin was prepped with ChloraPrep stick and an 18-gauge needle was inserted with 25 cc of cloudy fluid aspirated with injection of 40 mg Kenalog and 5 cc Marcaine the needle was withdrawn and a Band-Aid applied.  Patient tolerated procedure well  Specimen: Knee fluid sent to lab for synovial fluid analysis and culture as well as crystalline study.

## 2019-07-09 NOTE — Evaluation (Signed)
Physical Therapy Evaluation Patient Details Name: Kristy [name] MRN: [id number] DOB: 27-Jan-1951 Today's Date: 07/09/2019   History of Present Illness  68 yo female presented to ED after progressing weakness which she thought was DKA, pt treated in ED few days prior complaining of pain in left leg thought to be bakers cyst and mild UTI, labs in ED noted to have uncontrolled diabetes with hyperglycemia. PMH includes HTN, uncontrolled DM, chronic systemic HF, chrohns disease, nonischemic cardiomyopathy  Clinical Impression  Pt 68 yo female admitted for above. Pt in bed upon arrival reporting she was very tired but agreed to participate with PT. Pt presents with decreased strength, ROM and balance limiting functional mobility. Pt lives alone and has family nearby but they can only check in occasionally and she states she needs more help. Pt reports she has had difficulty with her balance this year having 20 falls. Pt states when she falls she is unable to get back up and just has to wait on floor for a family member to show up. Pt educated on using an alert system with pt stating that her and her family were beginning to discuss this option. Pt states on Sunday night she fell out of the bed and just slept on the floor because she was unable to get up. Pt also reports being independent with all ADLs/IADLs. Pt states she had a stroke in march that affected her left side but she did not have any therapy for it. Pt required min assist to rise to standing from elevated surface with significant UE use pushing up from thighs. Pt frequently reached out for counter top in room during standing to assist with balance. Pt standings with LLE in a knee flexed position without putting weight on LE. Pt encouraged and able to perform minimal weight shifting on LLE and pt refused to attempt any ambulation even with AD. Pt able to perform a couple side steps to head of bed with significant L knee blocking from therapist. Pt  would benefit from skilled acute therapy to improve deficits. Pt would benefit from STR following hospital discharge in order to improve deficits noted, reduce fall risk and improve independence. Pt educated on recommendation and agreed.     Follow Up Recommendations SNF    Equipment Recommendations  Other (comment)(TBD, pt already has RW)    Recommendations for Other Services OT consult     Precautions / Restrictions Precautions Precautions: Fall Restrictions Weight Bearing Restrictions: No      Mobility  Bed Mobility Overal bed mobility: Modified Independent             General bed mobility comments: pt mod I for bed mobility requiring increased time and effort  Transfers Overall transfer level: Needs assistance Equipment used: None Transfers: Sit to/from Stand Sit to Stand: From elevated surface;Min assist         General transfer comment: pt required min assist to rise from elevated bed, pt states she has a lot fo difficulty getting up from surfaces at home, pt had to use a lot of effort from UE to rise  Ambulation/Gait             General Gait Details: pt refused to attempt ambulation even with AD, pt refused to fully accept weight onto LLE despite encouragement and proper guarding and knee blocking from therapist, pt able to perform a few side steps towards head of bed with strong knee blocking from therapist and encouragement, pt heavy reliance on UE using  bed rails to push up from and reaching out for countertop  Stairs            Wheelchair Mobility    Modified [facility] (Stroke Patients Only)       Balance Overall balance assessment: Needs assistance;History of Falls   Sitting balance-Leahy Scale: Good Sitting balance - Comments: steady sitting EOB     Standing balance-Leahy Scale: Fair Standing balance comment: pt requires UE support from one hand to maintain standing balance                             Pertinent  Vitals/Pain Pain Assessment: 0-10 Pain Score: 6  Pain Location: back of knee to top ankle Pain Descriptors / Indicators: Sharp;Aching;Grimacing Pain Intervention(s): Limited activity within patient's tolerance;Monitored during session;Repositioned    Home Living Family/patient expects to be discharged to:: Private residence Living Arrangements: Alone Available Help at Discharge: Family;Available PRN/intermittently Type of Home: House Home Access: Stairs to enter Entrance Stairs-Rails: Can reach both Entrance Stairs-Number of Steps: 5 Home Layout: One level Home Equipment: Walker - 2 wheels;Cane - single point;Shower seat;Crutches;Grab bars - tub/shower(pt states she does not use shower seat, and the grab bars in the shower are not stable enough to actually use)      Prior Function Level of Independence: Independent         Comments: pt reports being independent with ADLs/IADLs but has had difficulty with her balance this year, pt reports having 20 falls in the last year, pt states she is unable to get off the floor when she falls and just has to wait for her stepdaughter or niece to come by, pt reports she had a stroke in march affecting her left side and she did not receive any rehab     Hand Dominance        Extremity/Trunk Assessment   Upper Extremity Assessment Upper Extremity Assessment: Overall WFL for tasks assessed    Lower Extremity Assessment Lower Extremity Assessment: RLE deficits/detail;LLE deficits/detail RLE Deficits / Details: RLE strength grossly 4/5 LLE Deficits / Details: LLE strength grossly 3/5, difficult to assess quads and hams secondary to knee pain       Communication   Communication: No difficulties  Cognition Arousal/Alertness: Awake/alert Behavior During Therapy: WFL for tasks assessed/performed Overall Cognitive Status: Within Functional Limits for tasks assessed                                        General Comments       Exercises Total Joint Exercises Ankle Circles/Pumps: AROM;Both;10 reps Quad Sets: AROM;Both;10 reps Heel Slides: AROM;Left;10 reps;AAROM;Right Hip ABduction/ADduction: AROM;Both;10 reps Straight Leg Raises: AAROM;Left;10 reps;AROM;Right Long Arc Quad: AROM;Both;10 reps(unable to perform full ROM on L) Marching in Standing: AROM;Both;10 reps;Seated(minimal ROM on left)   Assessment/Plan    PT Assessment Patient needs continued PT services  PT Problem List Decreased strength;Decreased mobility;Decreased safety awareness;Decreased range of motion;Decreased activity tolerance;Decreased balance;Pain;Decreased knowledge of use of DME       PT Treatment Interventions DME instruction;Therapeutic exercise;Gait training;Stair training;Functional mobility training;Therapeutic activities;Patient/family education;Neuromuscular re-education;Balance training    PT Goals (Current goals can be found in the Care Plan section)  Acute Rehab PT Goals Patient Stated Goal: improve strength and balance PT Goal Formulation: With patient Time For Goal Achievement: 07/23/19 Potential to Achieve Goals: Fair    Frequency Min  2X/week   Barriers to discharge Decreased caregiver support      Co-evaluation               AM-PAC PT "6 Clicks" Mobility  Outcome Measure Help needed turning from your back to your side while in a flat bed without using bedrails?: A Little Help needed moving from lying on your back to sitting on the side of a flat bed without using bedrails?: A Little Help needed moving to and from a bed to a chair (including a wheelchair)?: A Little Help needed standing up from a chair using your arms (e.g., wheelchair or bedside chair)?: A Little Help needed to walk in hospital room?: A Lot Help needed climbing 3-5 steps with a railing? : A Lot 6 Click Score: 16    End of Session Equipment Utilized During Treatment: Gait belt Activity Tolerance: Patient limited by fatigue;Patient  limited by pain Patient left: in bed;with call bell/phone within reach;with nursing/sitter in room;with bed alarm set Nurse Communication: Mobility status PT Visit Diagnosis: Difficulty in walking, not elsewhere classified (R26.2);Repeated falls (R29.6);Unsteadiness on feet (R26.81)    Time: 1027-2536 PT Time Calculation (min) (ACUTE ONLY): 33 min   Charges:   PT Evaluation $PT Eval Low Complexity: 1 Low PT Treatments $Therapeutic Exercise: 8-22 mins       Hasan Douse PT, DPT 1:40 PM,07/09/19 [phone]

## 2019-07-09 NOTE — Progress Notes (Signed)
[place] [facility] NAME: Kristy [name]    MR#:  [id number]  DATE OF BIRTH:  [birth year]  SUBJECTIVE:  CHIEF COMPLAINT:   Chief Complaint  Patient presents with  . Hyperglycemia   -Emotional, feels like she does not have any support.  Mentions that she cannot live by herself anymore. -Left knee swelling and pain.  Sugars are elevated.  REVIEW OF SYSTEMS:  Review of Systems  Constitutional: Positive for malaise/fatigue. Negative for chills and fever.  HENT: Negative for hearing loss, nosebleeds and tinnitus.   Eyes: Negative for blurred vision and double vision.  Respiratory: Negative for cough, shortness of breath and wheezing.   Cardiovascular: Positive for leg swelling. Negative for chest pain and palpitations.  Gastrointestinal: Negative for abdominal pain, constipation, diarrhea, nausea and vomiting.  Genitourinary: Negative for dysuria.  Musculoskeletal: Positive for falls and joint pain.  Neurological: Positive for weakness. Negative for dizziness, seizures and headaches.  Psychiatric/Behavioral: Negative for depression and suicidal ideas.    DRUG ALLERGIES:   Allergies  Allergen Reactions  . Fish Oil Other (See Comments)    Gout  . Glimepiride     REACTION: hypoglycemia  . Guanfacine Hcl     REACTION: unspecified  . Rosiglitazone Other (See Comments)    CHF    VITALS:  Blood pressure 130/62, pulse 85, temperature 98.3 F (36.8 C), temperature source Oral, resp. rate 19, height 5' 3"  (1.6 m), weight 68 kg, SpO2 99 %.  PHYSICAL EXAMINATION:  Physical Exam   GENERAL:  68 y.o.-year-old patient lying in the bed with no acute distress.  EYES: Pupils equal, round, reactive to light and accommodation. No scleral icterus. Extraocular muscles intact.  HEENT: Head atraumatic, normocephalic. Oropharynx and nasopharynx clear.  NECK:  Supple, no jugular venous distention. No thyroid enlargement, no tenderness.  LUNGS:  Normal breath sounds bilaterally, no wheezing, rales,rhonchi or crepitation. No use of accessory muscles of respiration. Decreased bibasilar breath sounds CARDIOVASCULAR: S1, S2 normal. No murmurs, rubs, or gallops.  ABDOMEN: Soft, nontender, nondistended. Bowel sounds present. No organomegaly or mass.  EXTREMITIES: swelling of left leg 1+ below the knee and pain in the left popliteal fossa.  No  cyanosis, or clubbing.  NEUROLOGIC: Cranial nerves II through XII are intact. Muscle strength 5/5 in all extremities. Sensation intact. Gait not checked.  Generalized weakness. PSYCHIATRIC: The patient is alert and oriented x 3.  SKIN: No obvious rash, lesion, or ulcer.    LABORATORY PANEL:   CBC Recent Labs  Lab 07/09/19 0514  WBC 10.4  HGB 10.3*  HCT 31.3*  PLT 305   ------------------------------------------------------------------------------------------------------------------  Chemistries  Recent Labs  Lab 07/03/19 1152  07/09/19 0357  NA 132*   < > 137  K 4.1   < > 3.5  CL 98   < > 110  CO2 24   < > 20*  GLUCOSE 446*   < > 238*  BUN 26*   < > 18  CREATININE 1.17*   < > 0.76  CALCIUM 9.8   < > 8.7*  AST 18  --   --   ALT 12  --   --   ALKPHOS 73  --   --   BILITOT 1.0  --   --    < > = values in this interval not displayed.   ------------------------------------------------------------------------------------------------------------------  Cardiac Enzymes No results for input(s): TROPONINI in the last 168 hours. ------------------------------------------------------------------------------------------------------------------  RADIOLOGY:  No results found.  EKG:   Orders placed or performed during the hospital encounter of 07/08/19  . ED EKG  . ED EKG  . EKG 12-Lead  . EKG 12-Lead    ASSESSMENT AND PLAN:   68 year old female with past medical history significant for uncontrolled diabetes, hypertension, systolic CHF, nonischemic cardiomyopathy presents to  hospital secondary to weakness and noted to be hyperglycemic.  1.  Uncontrolled diabetes mellitus with hyperglycemia-patient's endocrinologist ordered [facility]. -She has been on the concentrated U500 regular insulin with meals regimen for more than a year now. -Her A1c now is 10.9 which is improved from 15.5 about 3 months ago -Appreciate diabetes coordinator input.  Continue U500 insulin with meals and also resistant sliding scale while in the hospital. -Will need outpatient follow-up with endocrinology  2.  Left leg pain-significant swelling in the left popliteal fossa and 1+ edema of the left leg.  Ultrasound done last week showing dissected Baker's cyst. -Orthopedics consult requested for the same -Physical therapy consulted  3.  Recent UTI-continue IV Rocephin and stop after 3 days  4.  Nonischemic cardiomyopathy and chronic systolic CHF-medication related. -Well compensated at this time.  Continue Entresto, Coreg, Aldactone and ivabradine  5.  DVT prophylaxis-Lovenox  Patient claims that she is extremely weak, unable to care for herself.  We will get physical therapy consult and possible placement.  Social worker consult as well   All the records are reviewed and case discussed with Care Management/Social Workerr. Management plans discussed with the patient, family and they are in agreement.  CODE STATUS: Full code  TOTAL TIME TAKING CARE OF THIS PATIENT: 37 minutes.   POSSIBLE D/C IN 2 DAYS, DEPENDING ON CLINICAL CONDITION.   Gladstone Lighter M.D on 07/09/2019 at 11:40 AM  Between 7am to 6pm - Pager - [phone]  After 6pm go to [email or web address] - password [facility]  Office  [phone]  CC: Primary care physician; Owens Loffler, MD

## 2019-07-09 NOTE — TOC Initial Note (Signed)
Transition of Care [facility]) - Initial/Assessment Note    Patient Details  Name: Kristy [name] MRN: [id number] Date of Birth: 22-Nov-1950  Transition of Care [facility]) CM/SW Contact:    Beverly Sessions, RN Phone Number: 07/09/2019, 3:52 PM  Clinical Narrative:                  Patient admitted from home with UTI Patient states that she lives at home alone Niece and step daughter live locally for support  PCP [facility].  Denies any issues obtaining her medications   Patient states that at baseline she is able to drive herself to appointments  Patient has a history of frequent falls.  Patient states she has a BSC, RW, and cane in the home  PT has assessed patient and recommends SNF. Patient in agreement.  PASRR obtained.  Bedsearch initiated   Patient has previously had home health services with [facility].    Received Permission to update niece who is healthcare POA.  She is in agreement to pursue SNF.   RNCM following   Expected Discharge Plan: [place] Barriers to Discharge: Continued Medical Work up   Patient Goals and CMS Choice        Expected Discharge Plan and Services Expected Discharge Plan: [place]   Discharge Planning Services: CM Consult Post Acute Care Choice: [place] Living arrangements for the past 2 months: Single Family Home                                      Prior Living Arrangements/Services Living arrangements for the past 2 months: Single Family Home Lives with:: Self Patient language and need for interpreter reviewed:: Yes Do you feel safe going back to the place where you live?: Yes      Need for Family Participation in Patient Care: Yes (Comment) Care giver support system in place?: Yes (comment) Current home services: DME Criminal Activity/Legal Involvement Pertinent to Current Situation/Hospitalization: No - Comment as needed  Activities of Daily  Living Home Assistive Devices/Equipment: Crutches, Walker (specify type) ADL Screening (condition at time of admission) Patient's cognitive ability adequate to safely complete daily activities?: Yes Is the patient deaf or have difficulty hearing?: No Does the patient have difficulty seeing, even when wearing glasses/contacts?: No Does the patient have difficulty concentrating, remembering, or making decisions?: No Patient able to express need for assistance with ADLs?: Yes Does the patient have difficulty dressing or bathing?: Yes Independently performs ADLs?: No Communication: Independent Dressing (OT): Needs assistance Is this a change from baseline?: Change from baseline, expected to last <3days Grooming: Needs assistance Is this a change from baseline?: Change from baseline, expected to last <3 days Feeding: Independent Bathing: Needs assistance Is this a change from baseline?: Change from baseline, expected to last <3 days Toileting: Needs assistance Is this a change from baseline?: Change from baseline, expected to last <3 days In/Out Bed: Needs assistance Is this a change from baseline?: Change from baseline, expected to last <3 days Walks in Home: Needs assistance Is this a change from baseline?: Change from baseline, expected to last <3 days Does the patient have difficulty walking or climbing stairs?: Yes Weakness of Legs: Both Weakness of Arms/Hands: None  Permission Sought/Granted Permission sought to share information with : Case Manager Permission granted to share information with : Yes, Verbal Permission Granted  Share Information  with NAME: Niece           Emotional Assessment Appearance:: Appears stated age Attitude/Demeanor/Rapport: Engaged Affect (typically observed): Accepting Orientation: : Oriented to Self, Oriented to Place, Oriented to  Time, Oriented to Situation Alcohol / Substance Use: Other (comment)(former smoker) Psych Involvement: No  (comment)  Admission diagnosis:  Dehydration [E86.0] Weakness [R53.1] Hyperglycemia [R73.9] Urinary tract infection without hematuria, site unspecified [N39.0] Patient Active Problem List   Diagnosis Date Noted  . UTI (urinary tract infection) 07/08/2019  . Elevated troponin   . DKA (diabetic ketoacidoses) ([place]) 04/19/2019  . Pneumonia 04/19/2019  . NICM (nonischemic cardiomyopathy) ([place]) 11/08/2018  . Acute on chronic HFrEF (heart failure with reduced ejection fraction) ([place]) 08/22/2018  . Coronary artery disease, non-occlusive 09/13/2016  . Primary osteoarthritis involving multiple joints 03/14/2015  . Personal history of other malignant neoplasm of skin 12/01/2014  . Crohn's disease ([place]) 10/13/2013  . Major depressive disorder, recurrent, in full remission ([place]) 05/01/2011  . TRANSAMINASES, SERUM, ELEVATED 05/01/2010  . PSORIASIS 08/02/2009  . Hyperlipidemia 05/11/2009  . GOUT 02/05/2009  . Uncontrolled type 2 diabetes mellitus with stage 3 chronic kidney disease, with long-term current use of insulin ([place])   . Essential hypertension 04/18/2007  . LBBB (left bundle branch block) 04/18/2007  . ALLERGIC RHINITIS 04/18/2007   PCP:  Owens Loffler, MD Pharmacy:   [facility] [address] [place] [address] Phone: [phone] Fax: [phone]  [place], [place] [address] [place] [address] Phone: [phone] Fax: [phone]  [facility] [place], [place] - [address] [place] [address] Phone: [phone] Fax: [phone]     Social Determinants of Health ([place]) Interventions    Readmission Risk Interventions Readmission Risk Prevention Plan 04/28/2019 04/27/2019  Post Dischage Appt - Complete  Medication Screening - Complete  Transportation Screening - Complete  PCP or Specialist Appt  within 5-7 Days Complete -  Home Care Screening Complete -  Medication Review (RN CM) Complete -  Some recent data might be hidden

## 2019-07-09 NOTE — Progress Notes (Addendum)
Results for [name], Kristy P (MRN [id number]) as of 07/09/2019 07:08  Ref. Range 04/20/2019 00:27 07/08/2019 15:07  Hemoglobin A1C Latest Ref Range: 4.8 - 5.6 % >15.5 (H) 10.9 (H)  (266 mg/dl)    Home DM Meds: Concentrated U500 Regular Insulin per SSI                             <200- 0 units        201-300- 20 units                              300-400- 50 units                              401-500- 90 units                             >500- 100 units   Current A1c of 10.9% shows some improvement since her A1c was taken in July (was >15.5%).  Endocrinologist: Dr. Canos and Lauran Overton, NP with [facility]--last seen 12/16/2018  Seen by the Diabetes Coordinator on 04/23/2019 during her admission for DKA.    Met with pt today to discuss her Current A1c of 10.9% (which is an improvement since July), home insulin regimen, etc.  Pt told me she has had Diabetes for 20 years.  Took oral meds at diagnosis but stated to me that she took Avandia which gave her heart failure.  Stopped the Avandia years ago and has tried multiple different insulin and oral med regimens over the years.  Was seeing ENDO practice in [place] but felt like the MDs at  The practice weren't listening to her and felt like she had reached the end with them.  Sought care with the [facility] practice and feels more successful there.  Told me she has always had extremely labile CBGs and feels like the U500 Insulin (though not perfect) is helping her better than other insulin regimens in the past.  Worked out a sliding scale regimen for her U500 insuli with her [facility] and follows the above listed U500 insulin regimen at home.  Has issues with mild Hypoglycemia (CBG 60s) but states she always feels the low events coming on and treats appropriately.  Does not take the U500 insulin for CBGs <200.  Stated she may have an allergy to Lantus (as she always felt bad when taking that insulin) but that is not currently listed as  an allergy in her chart.  Is OK with us giving her Novolog SSI and has no allergies to Novolog that she knows of.  Told me her appt with the [facility] was cancelled in August and she needs to reschedule.  Also needs to complete the paperwork for obtaining a CGM for glucose monitoring at home so she can have more CBG data to give to her ENDO.  Currently checks her fingerstick CBGs up to 8 times per day.  Pt appreciative of my visit and stated she has no issues giving herself insulin.  Remembers to rotate and properly stores her insulin at home.    --Will follow patient during hospitalization--   [place]  RN, MSN, CDE Diabetes Coordinator Inpatient Glycemic Control Team Team Pager: [phone] (8a-5p)  

## 2019-07-09 NOTE — Plan of Care (Signed)
  Problem: Activity: Goal: Risk for activity intolerance will decrease Outcome: Progressing   Problem: Pain Managment: Goal: General experience of comfort will improve Outcome: Progressing  Patient with chronic pain due to cyst on knee. Patient unable to bear weight. PRN medication given.

## 2019-07-09 NOTE — Progress Notes (Addendum)
Inpatient Diabetes Program Recommendations  AACE/ADA: New Consensus Statement on Inpatient Glycemic Control (2015)  Target Ranges:  Prepandial:   less than 140 mg/dL      Peak postprandial:   less than 180 mg/dL (1-2 hours)      Critically ill patients:  140 - 180 mg/dL   Results for Kristy [name], Kristy [name] (MRN [id number]) as of 07/09/2019 07:08  Ref. Range 07/08/2019 14:59 07/08/2019 16:57 07/08/2019 18:04 07/08/2019 19:54 07/08/2019 20:34  Glucose-Capillary Latest Ref Range: 70 - 99 mg/dL 463 (H) 488 (H)  10 units NOVOLOG  393 (H)  10 units NOVOLOG  299 (H) 335 (H)  4 units NOVOLOG +  50 units U500 Insulin given at 10pm   Results for Kristy [name], Kristy [name] (MRN [id number]) as of 07/09/2019 07:08  Ref. Range 04/20/2019 00:27 07/08/2019 15:07  Hemoglobin A1C Latest Ref Range: 4.8 - 5.6 % >15.5 (H) 10.9 (H)  (266 mg/dl)    Admit with: Hyperglycemia (recent UTI)  History: DM, CHF  Home DM Meds: Concentrated U500 Regular Insulin per SSI       <200- 0 units       300-400- 50 units       401-500- 90 units       >500- 100 units  Current Orders: Novolog Resistant Correction Scale/ SSI (0-20 units) TID AC + HS       Concentrated U500 Regular Insulin      <200- 0 units      201-300- 20 units        301-400- 50 units      401-500- 90 units      >500- 100 units          Received 2L NS in the ED and home insulin started last PM.  Current A1c of 10.9% shows some improvement since her A1c was taken in July (was >15.5%).  Endocrinologist: Dr. Garrel Ridgel and Elder Love, NP with [facility]--last seen 12/16/2018  Seen by the Diabetes Coordinator on 04/23/2019 during her admission for DKA.  Will try to speak with pt today about her home DM care regimen.    --Will follow patient during hospitalization--  Wyn Quaker RN, MSN, CDE Diabetes Coordinator Inpatient Glycemic Control Team Team Pager: [phone] (8a-5p)

## 2019-07-09 NOTE — Consult Note (Signed)
Reason for Consult: Left knee pain swelling and Baker's cyst Referring Physician: Dr. Nadean Corwin Kristy [name] is an 68 y.o. female.  HPI: Patient is a 68 year old diabetic with significant heart disease who has been having severe left knee pain since last Thursday.  She is noticed swelling and posterior knee pain.  She had an ultrasound that showed a large Baker's cyst.  Past Medical History:  Diagnosis Date  . Allergic rhinitis   . Allergy   . Cataract    mild   . Crohn's disease ([place]) 10/13/2013  . Diverticulosis   . GERD (gastroesophageal reflux disease)   . Gout   . HFrEF (heart failure with reduced ejection fraction) ([place])    a. 12/2008 Cath: EF 45% w/ inf HK; b. 07/2009 Echo: EF 50-55%; c. 08/2018 Echo: EF 20-25%.  Marland Kitchen Hyperlipidemia   . Hypertension   . IBS (irritable bowel syndrome)   . Left bundle branch block   . Neuromuscular disorder (HCC)    neuropathy  . NICM (nonischemic cardiomyopathy) ([place])    a. 12/2008 Cath: no significant dzs, EF 45% w/ inf HK->Med Rx; b. 07/2009 Echo: EF 50-55%; c. 08/2018 Echo: EF 20-25%, ant/antsept HK, mild MR, mildly dil LA, nl RV fx; d. 08/2018 Cath: D1 80, otw nonobs dzs->Med rx.  . Non-obstructive CAD (coronary artery disease)    a. 12/2008 Cath: no significant dzs, EF 45% w/ inf HK->Med Rx; b. 08/2018 Cath: LM nl, LAD min irregs, D1 80, LCX 74md, RCA min irregs->Med Rx.  . Osteoarthritis   . Poorly controlled Diabetes mellitus    a. 07/2018 A1c 13.1.  .Marland KitchenSymptomatic cholelithiasis   . Uncontrolled type 2 diabetes mellitus with stage 3 chronic kidney disease, with long-term current use of insulin ([place]          Past Surgical History:  Procedure Laterality Date  . BREAST BIOPSY Right 06/24/2019   stereo bx/ x clip/ path pending  . BREAST CYST ASPIRATION    . COLONOSCOPY    . DILATION AND CURETTAGE OF UTERUS    . LEFT HEART CATH AND CORONARY ANGIOGRAPHY N/A 04/24/2019   Procedure: LEFT HEART CATH AND CORONARY ANGIOGRAPHY;  Surgeon:  ENelva Bush MD;  [facility];  Service: Cardiovascular;  Laterality: N/A;  . RIGHT/LEFT HEART CATH AND CORONARY ANGIOGRAPHY N/A 08/26/2018   Procedure: RIGHT/LEFT HEART CATH AND CORONARY ANGIOGRAPHY;  Surgeon: AWellington Hampshire MD;  [facility];  Service: Cardiovascular;  Laterality: N/A;  . SHOULDER SURGERY     15 + yrs ago   . SKIN SURGERY     nose   . VAGINAL HYSTERECTOMY      Family History  Problem Relation Age of Onset  . Kidney failure Mother   . Hypertension Father   . Kidney failure Brother   . Colon cancer Neg Hx   . Colon polyps Neg Hx   . Rectal cancer Neg Hx   . Stomach cancer Neg Hx   . Breast cancer Neg Hx     Social History:  reports that she quit smoking about 22 years ago. Her smoking use included cigarettes. She has a 22.50 pack-year smoking history. She has never used smokeless tobacco. She reports that she does not drink alcohol or use drugs.  Allergies:  Allergies  Allergen Reactions  . Fish Oil Other (See Comments)    Gout  . Glimepiride     REACTION: hypoglycemia  . Guanfacine Hcl     REACTION: unspecified  .  Rosiglitazone Other (See Comments)    CHF    Medications: I have reviewed the patient's current medications.  Results for orders placed or performed during the hospital encounter of 07/08/19 (from the past 48 hour(s))  Glucose, capillary     Status: Abnormal   Collection Time: 07/08/19  2:59 PM  Result Value Ref Range   Glucose-Capillary 463 (H) 70 - 99 mg/dL  Basic metabolic panel     Status: Abnormal   Collection Time: 07/08/19  3:02 PM  Result Value Ref Range   Sodium 132 (L) 135 - 145 mmol/L   Potassium 3.7 3.5 - 5.1 mmol/L   Chloride 97 (L) 98 - 111 mmol/L   CO2 20 (L) 22 - 32 mmol/L   Glucose, Bld 480 (H) 70 - 99 mg/dL   BUN 25 (H) 8 - 23 mg/dL   Creatinine, Ser 1.28 (H) 0.44 - 1.00 mg/dL   Calcium 9.8 8.9 - 10.3 mg/dL   GFR calc non Af Amer 43 (L) >60 mL/min   GFR calc Af Amer 50  (L) >60 mL/min   Anion gap 15 5 - 15    Comment: Performed at [facility], [address] [place], [place] [address]  CBC     Status: Abnormal   Collection Time: 07/08/19  3:02 PM  Result Value Ref Range   WBC 14.6 (H) 4.0 - 10.5 K/uL   RBC 4.11 3.87 - 5.11 MIL/uL   Hemoglobin 12.2 12.0 - 15.0 g/dL   HCT 37.2 36.0 - 46.0 %   MCV 90.5 80.0 - 100.0 fL   MCH 29.7 26.0 - 34.0 pg   MCHC 32.8 30.0 - 36.0 g/dL   RDW 12.4 11.5 - 15.5 %   Platelets 406 (H) 150 - 400 K/uL   nRBC 0.0 0.0 - 0.2 %    Comment: Performed at [facility], [address] [place], [place] [address]  Hemoglobin A1c     Status: Abnormal   Collection Time: 07/08/19  3:07 PM  Result Value Ref Range   Hgb A1c MFr Bld 10.9 (H) 4.8 - 5.6 %    Comment: (NOTE) Pre diabetes:          5.7%-6.4% Diabetes:              >6.4% Glycemic control for   <7.0% adults with diabetes    Mean Plasma Glucose 266.13 mg/dL    Comment: Performed at [facility], [address] [place], [place] [address]  Troponin I (High Sensitivity)     Status: None   Collection Time: 07/08/19  4:07 PM  Result Value Ref Range   Troponin I (High Sensitivity) 15 <18 ng/L    Comment: (NOTE) Elevated high sensitivity troponin I (hsTnI) values and significant  changes across serial measurements may suggest ACS but many other  chronic and acute conditions are known to elevate hsTnI results.  Refer to the "Links" section for chest pain algorithms and additional  guidance. Performed at [facility], [address] [place], [place] [address]   Glucose, capillary     Status: Abnormal   Collection Time: 07/08/19  4:57 PM  Result Value Ref Range   Glucose-Capillary 488 (H) 70 - 99 mg/dL  Blood gas, venous     Status: Abnormal   Collection Time: 07/08/19  5:08 PM  Result Value Ref Range   pH, Ven 7.30 7.250 - 7.430   pCO2, Ven 40 (L) 44.0 - 60.0 mmHg   pO2, Ven <  31.0 (LL) 32.0 - 45.0 mmHg   Bicarbonate 19.7 (L)  20.0 - 28.0 mmol/L   Acid-base deficit 6.3 (H) 0.0 - 2.0 mmol/L   O2 Saturation 15.4 %   Patient temperature 37.0    Collection site VEIN    Sample type VENOUS     Comment: Performed at [facility], [address] [place], [place] [address]  Urinalysis, Complete w Microscopic     Status: Abnormal   Collection Time: 07/08/19  5:29 PM  Result Value Ref Range   Color, Urine YELLOW (A) YELLOW   APPearance TURBID (A) CLEAR   Specific Gravity, Urine 1.017 1.005 - 1.030   pH 6.0 5.0 - 8.0   Glucose, UA >=500 (A) NEGATIVE mg/dL   Hgb urine dipstick NEGATIVE NEGATIVE   Bilirubin Urine NEGATIVE NEGATIVE   Ketones, ur 20 (A) NEGATIVE mg/dL   Protein, ur 30 (A) NEGATIVE mg/dL   Nitrite NEGATIVE NEGATIVE   Leukocytes,Ua LARGE (A) NEGATIVE   RBC / HPF 0-5 0 - 5 RBC/hpf   WBC, UA >50 (H) 0 - 5 WBC/hpf   Bacteria, UA NONE SEEN NONE SEEN   Squamous Epithelial / LPF 0-5 0 - 5   WBC Clumps PRESENT    Budding Yeast PRESENT     Comment: Performed at [facility] [address] [place], [place] [address]  Glucose, capillary     Status: Abnormal   Collection Time: 07/08/19  6:04 PM  Result Value Ref Range   Glucose-Capillary 393 (H) 70 - 99 mg/dL  SARS CORONAVIRUS 2 (TAT 6-24 HRS) Nasopharyngeal Nasopharyngeal Swab     Status: None   Collection Time: 07/08/19  7:19 PM   Specimen: Nasopharyngeal Swab  Result Value Ref Range   SARS Coronavirus 2 NEGATIVE NEGATIVE    Comment: (NOTE) SARS-CoV-2 target nucleic acids are NOT DETECTED. The SARS-CoV-2 RNA is generally detectable in upper and lower respiratory specimens during the acute phase of infection. Negative results do not preclude SARS-CoV-2 infection, do not rule out co-infections with other pathogens, and should not be used as the sole basis for treatment or other patient management decisions. Negative results must be combined with clinical observations, patient history, and epidemiological information. The  expected result is Negative. Fact Sheet for Patients: SugarRoll.be Fact Sheet for Healthcare Providers: https://www.woods-mathews.com/ This test is not yet approved or cleared by the [place] FDA and  has been authorized for detection and/or diagnosis of SARS-CoV-2 by FDA under an Emergency Use Authorization (EUA). This EUA will remain  in effect (meaning this test can be used) for the duration of the COVID-19 declaration under Section 56 4(b)(1) of the Act, 21 U.S.C. section 360bbb-3(b)(1), unless the authorization is terminated or revoked sooner. Performed at [facility], [place] [address] [place], [place] [address]   Glucose, capillary     Status: Abnormal   Collection Time: 07/08/19  7:54 PM  Result Value Ref Range   Glucose-Capillary 299 (H) 70 - 99 mg/dL  Glucose, capillary     Status: Abnormal   Collection Time: 07/08/19  8:34 PM  Result Value Ref Range   Glucose-Capillary 335 (H) 70 - 99 mg/dL  HIV Antibody (routine testing w rflx)     Status: None   Collection Time: 07/09/19  3:57 AM  Result Value Ref Range   HIV Screen 4th Generation wRfx NON REACTIVE NON REACTIVE    Comment: Performed at [facility], [place] [address], [place], [place] [address]  Basic metabolic  panel     Status: Abnormal   Collection Time: 07/09/19  3:57 AM  Result Value Ref Range   Sodium 137 135 - 145 mmol/L   Potassium 3.5 3.5 - 5.1 mmol/L   Chloride 110 98 - 111 mmol/L   CO2 20 (L) 22 - 32 mmol/L   Glucose, Bld 238 (H) 70 - 99 mg/dL   BUN 18 8 - 23 mg/dL   Creatinine, Ser 0.76 0.44 - 1.00 mg/dL   Calcium 8.7 (L) 8.9 - 10.3 mg/dL   GFR calc non Af Amer >60 >60 mL/min   GFR calc Af Amer >60 >60 mL/min   Anion gap 7 5 - 15    Comment: Performed at [facility], [address] [place], [place] [address]  CBC     Status: Abnormal   Collection Time: 07/09/19  5:14 AM  Result Value Ref Range   WBC 10.4 4.0 - 10.5 K/uL   RBC 3.48  (L) 3.87 - 5.11 MIL/uL   Hemoglobin 10.3 (L) 12.0 - 15.0 g/dL   HCT 31.3 (L) 36.0 - 46.0 %   MCV 89.9 80.0 - 100.0 fL   MCH 29.6 26.0 - 34.0 pg   MCHC 32.9 30.0 - 36.0 g/dL   RDW 12.3 11.5 - 15.5 %   Platelets 305 150 - 400 K/uL   nRBC 0.0 0.0 - 0.2 %    Comment: Performed at [facility], [address] [place], [place] [address]  Glucose, capillary     Status: Abnormal   Collection Time: 07/09/19  7:27 AM  Result Value Ref Range   Glucose-Capillary 183 (H) 70 - 99 mg/dL   Comment 1 Notify RN   Glucose, capillary     Status: Abnormal   Collection Time: 07/09/19 11:56 AM  Result Value Ref Range   Glucose-Capillary 296 (H) 70 - 99 mg/dL   Comment 1 Notify RN     No results found.  ROS Blood pressure 137/68, pulse 79, temperature 98.4 F (36.9 C), temperature source Oral, resp. rate 18, height 5' 3"  (1.6 m), weight 68 kg, SpO2 97 %. Physical Exam there is a large effusion to her left knee she has a 15 degree flexion contracture and has pain with attempted flexion.  Quad and patellar tendons appear intact and there is a Baker's cyst on palpation.  Assessment/Plan: Probable gout attack left knee recommendation is for aspiration and injection will do that later today when medication available.  Hessie Knows 07/09/2019, 12:40 PM

## 2019-07-09 NOTE — NC FL2 (Signed)
[place] LEVEL OF CARE SCREENING TOOL     IDENTIFICATION  Patient Name: Kristy [name] Birthdate: [birth year] Sex: female Admission Date (Current Location): 07/08/2019  [facility] and [place] Number:  Engineering geologist and Address:         Provider Number: [id number]  Attending Physician Name and Address:  Gladstone Lighter, MD  Relative Name and Phone Number:       Current Level of Care: Hospital Recommended Level of Care: [place] Prior Approval Number:    Date Approved/Denied:   PASRR Number: [id number]  Discharge Plan: SNF    Current Diagnoses: Patient Active Problem List   Diagnosis Date Noted  . UTI (urinary tract infection) 07/08/2019  . Elevated troponin   . DKA (diabetic ketoacidoses) ([place]) 04/19/2019  . Pneumonia 04/19/2019  . NICM (nonischemic cardiomyopathy) ([place]) 11/08/2018  . Acute on chronic HFrEF (heart failure with reduced ejection fraction) ([place]) 08/22/2018  . Coronary artery disease, non-occlusive 09/13/2016  . Primary osteoarthritis involving multiple joints 03/14/2015  . Personal history of other malignant neoplasm of skin 12/01/2014  . Crohn's disease ([place]) 10/13/2013  . Major depressive disorder, recurrent, in full remission ([place]) 05/01/2011  . TRANSAMINASES, SERUM, ELEVATED 05/01/2010  . PSORIASIS 08/02/2009  . Hyperlipidemia 05/11/2009  . GOUT 02/05/2009  . Uncontrolled type 2 diabetes mellitus with stage 3 chronic kidney disease, with long-term current use of insulin ([place])   . Essential hypertension 04/18/2007  . LBBB (left bundle branch block) 04/18/2007  . ALLERGIC RHINITIS 04/18/2007    Orientation RESPIRATION BLADDER Height & Weight     Self, Time, Situation, Place  Normal Continent Weight: 68 kg Height:  5' 3"  (160 cm)  BEHAVIORAL SYMPTOMS/MOOD NEUROLOGICAL BOWEL NUTRITION STATUS      Continent Diet(Heart health carb modified)  AMBULATORY STATUS COMMUNICATION OF NEEDS Skin   Extensive  Assist Verbally Normal                       Personal Care Assistance Level of Assistance              Functional Limitations Info             SPECIAL CARE FACTORS FREQUENCY  PT (By licensed PT), OT (By licensed OT)                    Contractures Contractures Info: Not present    Additional Factors Info  Insulin Sliding Scale, Code Status, Allergies Code Status Info: Full Allergies Info: Fish oil,  glimepiride, guanfacine hcl, rosiglitazone           Current Medications (07/09/2019):  This is the current hospital active medication list Current Facility-Administered Medications  Medication Dose Route Frequency Provider Last Rate Last Dose  . 0.9 %  sodium chloride infusion   Intravenous Continuous Henreitta Leber, MD 100 mL/hr at 07/09/19 0900    . acetaminophen (TYLENOL) tablet 650 mg  650 mg Oral Q6H PRN Henreitta Leber, MD       Or  . acetaminophen (TYLENOL) suppository 650 mg  650 mg Rectal Q6H PRN Henreitta Leber, MD      . aspirin EC tablet 81 mg  81 mg Oral Daily Henreitta Leber, MD   81 mg at 07/09/19 0851  . atorvastatin (LIPITOR) tablet 80 mg  80 mg Oral q1800 Sainani, Belia Heman, MD      . bupivacaine (MARCAINE) 0.5 % injection 10 mL  10 mL Infiltration Once  Hessie Knows, MD      . carvedilol (COREG) tablet 12.5 mg  12.5 mg Oral BID Henreitta Leber, MD   12.5 mg at 07/09/19 0852  . cefTRIAXone (ROCEPHIN) 1 g in sodium chloride 0.9 % 100 mL IVPB  1 g Intravenous Q24H Sainani, Belia Heman, MD      . clopidogrel (PLAVIX) tablet 75 mg  75 mg Oral Q breakfast Henreitta Leber, MD   75 mg at 07/09/19 0852  . enoxaparin (LOVENOX) injection 40 mg  40 mg Subcutaneous Q24H Sainani, Vivek J, MD      . FLUoxetine (PROZAC) capsule 20 mg  20 mg Oral Daily Henreitta Leber, MD   20 mg at 07/09/19 0852  . HYDROcodone-acetaminophen (NORCO/VICODIN) 5-325 MG per tablet 1-2 tablet  1-2 tablet Oral Q6H PRN Gladstone Lighter, MD   2 tablet at 07/09/19 1343  . insulin  aspart (novoLOG) injection 0-20 Units  0-20 Units Subcutaneous TID WC Henreitta Leber, MD   11 Units at 07/09/19 1258  . insulin aspart (novoLOG) injection 0-5 Units  0-5 Units Subcutaneous QHS Henreitta Leber, MD   4 Units at 07/08/19 2108  . insulin regular human CONCENTRATED (HUMULIN R) 500 UNIT/ML kwikpen 50-100 Units  50-100 Units Subcutaneous TID AC & HS Henreitta Leber, MD   20 Units at 07/09/19 1258  . ivabradine (CORLANOR) tablet 5 mg  5 mg Oral BID WC Henreitta Leber, MD   5 mg at 07/09/19 0852  . ondansetron (ZOFRAN) tablet 4 mg  4 mg Oral Q6H PRN Henreitta Leber, MD       Or  . ondansetron (ZOFRAN) injection 4 mg  4 mg Intravenous Q6H PRN Henreitta Leber, MD      . sacubitril-valsartan (ENTRESTO) 49-51 mg per tablet  1 tablet Oral BID Henreitta Leber, MD   1 tablet at 07/09/19 [phone]  . spironolactone (ALDACTONE) tablet 25 mg  25 mg Oral Daily Henreitta Leber, MD   25 mg at 07/09/19 0852  . triamcinolone acetonide (KENALOG-40) injection 40 mg  40 mg Intra-articular Once Hessie Knows, MD         Discharge Medications: Please see discharge summary for a list of discharge medications.  Relevant Imaging Results:  Relevant Lab Results:   Additional Information ss [id number]  Beverly Sessions, RN

## 2019-07-10 LAB — URINE CULTURE: Culture: >=100000 — AB

## 2019-07-10 LAB — SYNOVIAL CELL COUNT + DIFF, W/ CRYSTALS
Crystals, Fluid: NONE SEEN
Eosinophils-Synovial: 0 %
Lymphocytes-Synovial Fld: 6 %
Monocyte-Macrophage-Synovial Fluid: 10 %
Neutrophil, Synovial: 84 %
WBC, Synovial: 12498 /mm3 — ABNORMAL HIGH (ref 0–200)

## 2019-07-10 LAB — GLUCOSE, CAPILLARY
Glucose-Capillary: 380 mg/dL — ABNORMAL HIGH (ref 70–99)
Glucose-Capillary: 433 mg/dL — ABNORMAL HIGH (ref 70–99)
Glucose-Capillary: 456 mg/dL — ABNORMAL HIGH (ref 70–99)
Glucose-Capillary: 282 mg/dL — ABNORMAL HIGH (ref 70–99)
Glucose-Capillary: 140 mg/dL — ABNORMAL HIGH (ref 70–99)

## 2019-07-10 NOTE — TOC Progression Note (Signed)
Transition of Care [facility]) - Progression Note    Patient Details  Name: Kristy [name] MRN: [id number] Date of Birth: [birth year]  Transition of [facility]) CM/SW Contact  Beverly Sessions, RN Phone Number: 07/10/2019, 4:57 PM  Clinical Narrative:    RNCM presented to bed offers to patient's niece per patient's request  [place] selected  Magda Paganini at [employer] at 1144, and she is to start [place]   Expected Discharge Plan: [place] Barriers to Discharge: Continued Medical Work up  Expected Discharge Plan and Services Expected Discharge Plan: [place]   Discharge Planning Services: CM Consult Post Acute Care Choice: [place] Living arrangements for the past 2 months: Single Family Home                                       Social Determinants of Health (SDOH) Interventions    Readmission Risk Interventions Readmission Risk Prevention Plan 04/28/2019 04/27/2019  Post Dischage Appt - Complete  Medication Screening - Complete  Transportation Screening - Complete  PCP or Specialist Appt within 5-7 Days Complete -  Home Care Screening Complete -  Medication Review (RN CM) Complete -  Some recent data might be hidden

## 2019-07-10 NOTE — Care Management Important Message (Signed)
Important Message  Patient Details  Name: Kristy [name] MRN: [id number] Date of Birth: [birth year]   Medicare Important Message Given:  Yes  Initial Medicare IM given by Patient Access Associate on 07/09/2019 at 11:26am.   [name] 07/10/2019, 11:56 AM

## 2019-07-10 NOTE — Progress Notes (Signed)
[facility] [place] NAME: Kristy [name]    MR#:  [id number]  DATE OF BIRTH:  [birth year]  SUBJECTIVE:  CHIEF COMPLAINT:   Chief Complaint  Patient presents with  . Hyperglycemia   -Patient feels better today.  Appears more clear and less emotional today. -Left knee pain is improved after Kenalog injection -We will need rehab at discharge.  REVIEW OF SYSTEMS:  Review of Systems  Constitutional: Positive for malaise/fatigue. Negative for chills and fever.  HENT: Negative for hearing loss, nosebleeds and tinnitus.   Eyes: Negative for blurred vision and double vision.  Respiratory: Negative for cough, shortness of breath and wheezing.   Cardiovascular: Positive for leg swelling. Negative for chest pain and palpitations.  Gastrointestinal: Negative for abdominal pain, constipation, diarrhea, nausea and vomiting.  Genitourinary: Negative for dysuria.  Musculoskeletal: Positive for falls and joint pain.  Neurological: Positive for weakness. Negative for dizziness, seizures and headaches.  Psychiatric/Behavioral: Negative for depression and suicidal ideas.    DRUG ALLERGIES:   Allergies  Allergen Reactions  . Fish Oil Other (See Comments)    Gout  . Glimepiride     REACTION: hypoglycemia  . Guanfacine Hcl     REACTION: unspecified  . Rosiglitazone Other (See Comments)    CHF    VITALS:  Blood pressure (!) 121/46, pulse (!) 59, temperature 98.3 F (36.8 C), temperature source Oral, resp. rate 15, height 5' 3"  (1.6 m), weight 68 kg, SpO2 97 %.  PHYSICAL EXAMINATION:  Physical Exam   GENERAL:  68 y.o.-year-old patient lying in the bed with no acute distress.  EYES: Pupils equal, round, reactive to light and accommodation. No scleral icterus. Extraocular muscles intact.  HEENT: Head atraumatic, normocephalic. Oropharynx and nasopharynx clear.  NECK:  Supple, no jugular venous distention. No thyroid enlargement, no tenderness.   LUNGS: Normal breath sounds bilaterally, no wheezing, rales,rhonchi or crepitation. No use of accessory muscles of respiration. Decreased bibasilar breath sounds CARDIOVASCULAR: S1, S2 normal. No murmurs, rubs, or gallops.  ABDOMEN: Soft, nontender, nondistended. Bowel sounds present. No organomegaly or mass.  EXTREMITIES: Left knee pain much improved today.  No swelling.  No  cyanosis, or clubbing.  NEUROLOGIC: Cranial nerves II through XII are intact. Muscle strength 5/5 in all extremities. Sensation intact. Gait not checked.  Generalized weakness. PSYCHIATRIC: The patient is alert and oriented x 3.  SKIN: No obvious rash, lesion, or ulcer.    LABORATORY PANEL:   CBC Recent Labs  Lab 07/09/19 0514  WBC 10.4  HGB 10.3*  HCT 31.3*  PLT 305   ------------------------------------------------------------------------------------------------------------------  Chemistries  Recent Labs  Lab 07/09/19 0357  NA 137  K 3.5  CL 110  CO2 20*  GLUCOSE 238*  BUN 18  CREATININE 0.76  CALCIUM 8.7*   ------------------------------------------------------------------------------------------------------------------  Cardiac Enzymes No results for input(s): TROPONINI in the last 168 hours. ------------------------------------------------------------------------------------------------------------------  RADIOLOGY:  No results found.  EKG:   Orders placed or performed during the hospital encounter of 07/08/19  . ED EKG  . ED EKG  . EKG 12-Lead  . EKG 12-Lead    ASSESSMENT AND PLAN:   68 year old female with past medical history significant for uncontrolled diabetes, hypertension, systolic CHF, nonischemic cardiomyopathy presents to hospital secondary to weakness and noted to be hyperglycemic.  1.  Uncontrolled diabetes mellitus with hyperglycemia-patient's endocrinologist ordered [facility]. -She has been on the concentrated U500 regular insulin with meals regimen for more  than a year now. -Her  A1c now is 10.9 which is improved from 15.5 about 3 months ago -Appreciate diabetes coordinator input.  Continue U500 insulin with meals and also resistant sliding scale while in the hospital. -Will need outpatient follow-up with endocrinology  2.  Left leg pain-significant swelling in the left knee popliteal fossa and 1+ edema of the left leg.  Ultrasound done last week showing dissected Baker's cyst. -Appreciate orthopedics consult.  Concern for gouty arthritis.  Aspiration done and Kenalog injection given.  Patient feels much better  3.  Recent UTI-continue IV Rocephin and stop after 3 days  4.  Nonischemic cardiomyopathy and chronic systolic CHF-medication related. -Well compensated at this time.  Continue Entresto, Coreg, Aldactone and ivabradine  5.  DVT prophylaxis-Lovenox   PT recommended rehab. -Social worker consulted    All the records are reviewed and case discussed with Care Management/Social Workerr. Management plans discussed with the patient, family and they are in agreement.  CODE STATUS: Full code  TOTAL TIME TAKING CARE OF THIS PATIENT: 37 minutes.   POSSIBLE D/C IN 2 DAYS, DEPENDING ON CLINICAL CONDITION.   Gladstone Lighter M.D on 07/10/2019 at 12:11 PM  Between 7am to 6pm - Pager - [phone]  After 6pm go to [email or web address] - password [facility]  Office  [phone]  CC: Primary care physician; Owens Loffler, MD

## 2019-07-10 NOTE — Progress Notes (Signed)
Dr. Rudene Christians Called this RN and stated that he had performed a knee aspiration on this patient and that he was not seeing any results in the laboratory results tab.  This RN called the Laboratory and the tech stated that she did not see any specimens in her lab, labeled with Kristy [name] (MRN: [id number]); No specimens noted around Pnuematic tube system on 2C unit.Barbaraann Faster, RN 7:04 AM 07/10/2019

## 2019-07-10 NOTE — Progress Notes (Signed)
MD notified pt BG was 433. No new order given. RN will cover pt with sliding scale insulin. RN will continue to assess and monitor pt.

## 2019-07-10 NOTE — Progress Notes (Signed)
Patient is doing much better after aspiration and injection.  Lab treated specimen is urine and we do not have all results back but I presume this is gout and will sign off she is a feels like she is prior able to get up and walk now.

## 2019-07-11 LAB — GLUCOSE, CAPILLARY
Glucose-Capillary: 223 mg/dL — ABNORMAL HIGH (ref 70–99)
Glucose-Capillary: 342 mg/dL — ABNORMAL HIGH (ref 70–99)
Glucose-Capillary: 202 mg/dL — ABNORMAL HIGH (ref 70–99)
Glucose-Capillary: 210 mg/dL — ABNORMAL HIGH (ref 70–99)

## 2019-07-11 LAB — BASIC METABOLIC PANEL
Anion gap: 8 (ref 5–15)
BUN: 16 mg/dL (ref 8–23)
CO2: 20 mmol/L — ABNORMAL LOW (ref 22–32)
Calcium: 8.6 mg/dL — ABNORMAL LOW (ref 8.9–10.3)
Chloride: 113 mmol/L — ABNORMAL HIGH (ref 98–111)
Creatinine, Ser: 0.67 mg/dL (ref 0.44–1.00)
GFR calc Af Amer: >60 mL/min (ref >60)
GFR calc non Af Amer: >60 mL/min (ref >60)
Glucose, Bld: 170 mg/dL — ABNORMAL HIGH (ref 70–99)
Potassium: 3.4 mmol/L — ABNORMAL LOW (ref 3.5–5.1)
Sodium: 141 mmol/L (ref 135–145)

## 2019-07-11 MED ORDER — HUMULIN R U-500 KWIKPEN 500 UNIT/ML ~~LOC~~ SOPN: 50.0000 [IU] | PEN_INJECTOR | Freq: Three times a day (TID) | SUBCUTANEOUS | 3 refills | Status: DC

## 2019-07-11 MED ORDER — INSULIN ASPART 100 UNIT/ML ~~LOC~~ SOLN: SUBCUTANEOUS | 11 refills | Status: DC

## 2019-07-11 MED ORDER — POTASSIUM CHLORIDE CRYS ER 20 MEQ PO TBCR: 40.0000 meq | EXTENDED_RELEASE_TABLET | Freq: Once | ORAL | Status: DC

## 2019-07-11 MED ORDER — TRAMADOL HCL 50 MG PO TABS: 50.0000 mg | ORAL_TABLET | Freq: Three times a day (TID) | ORAL | 0 refills | Status: DC | PRN

## 2019-07-11 NOTE — Progress Notes (Addendum)
[facility] at [place] NAME: Kristy [name]    MR#:  [id number]  DATE OF BIRTH:  19-Mar-1951  SUBJECTIVE:  CHIEF COMPLAINT:   Chief Complaint  Patient presents with  . Hyperglycemia   -Patient feels much better today.  No complaints, awaiting insurance authorization for discharge to rehab -Sugars are within normal limits  REVIEW OF SYSTEMS:  Review of Systems  Constitutional: Positive for malaise/fatigue. Negative for chills and fever.  HENT: Negative for hearing loss, nosebleeds and tinnitus.   Eyes: Negative for blurred vision and double vision.  Respiratory: Negative for cough, shortness of breath and wheezing.   Cardiovascular: Negative for chest pain, palpitations and leg swelling.  Gastrointestinal: Negative for abdominal pain, constipation, diarrhea, nausea and vomiting.  Genitourinary: Negative for dysuria.  Musculoskeletal: Positive for falls. Negative for joint pain.  Neurological: Positive for weakness. Negative for dizziness, seizures and headaches.  Psychiatric/Behavioral: Negative for depression and suicidal ideas.    DRUG ALLERGIES:   Allergies  Allergen Reactions  . Fish Oil Other (See Comments)    Gout  . Glimepiride     REACTION: hypoglycemia  . Guanfacine Hcl     REACTION: unspecified  . Rosiglitazone Other (See Comments)    CHF    VITALS:  Blood pressure (!) 148/56, pulse 66, temperature (!) 97.4 F (36.3 C), temperature source Oral, resp. rate 20, height 5' 3"  (1.6 m), weight 68 kg, SpO2 94 %.  PHYSICAL EXAMINATION:  Physical Exam   GENERAL:  68 y.o.-year-old patient lying in the bed with no acute distress.  EYES: Pupils equal, round, reactive to light and accommodation. No scleral icterus. Extraocular muscles intact.  HEENT: Head atraumatic, normocephalic. Oropharynx and nasopharynx clear.  NECK:  Supple, no jugular venous distention. No thyroid enlargement, no tenderness.  LUNGS: Normal breath  sounds bilaterally, no wheezing, rales,rhonchi or crepitation. No use of accessory muscles of respiration. Decreased bibasilar breath sounds CARDIOVASCULAR: S1, S2 normal. No murmurs, rubs, or gallops.  ABDOMEN: Soft, nontender, nondistended. Bowel sounds present. No organomegaly or mass.  EXTREMITIES: Left knee pain and swelling much improved today.  No swelling.  No  cyanosis, or clubbing.  Patient is able to bear weight NEUROLOGIC: Cranial nerves II through XII are intact. Muscle strength 5/5 in all extremities. Sensation intact. Gait not checked.  Generalized weakness. PSYCHIATRIC: The patient is alert and oriented x 3.  SKIN: No obvious rash, lesion, or ulcer.    LABORATORY PANEL:   CBC Recent Labs  Lab 07/09/19 0514  WBC 10.4  HGB 10.3*  HCT 31.3*  PLT 305   ------------------------------------------------------------------------------------------------------------------  Chemistries  Recent Labs  Lab 07/11/19 0508  NA 141  K 3.4*  CL 113*  CO2 20*  GLUCOSE 170*  BUN 16  CREATININE 0.67  CALCIUM 8.6*   ------------------------------------------------------------------------------------------------------------------  Cardiac Enzymes No results for input(s): TROPONINI in the last 168 hours. ------------------------------------------------------------------------------------------------------------------  RADIOLOGY:  No results found.  EKG:   Orders placed or performed during the hospital encounter of 07/08/19  . ED EKG  . ED EKG  . EKG 12-Lead  . EKG 12-Lead    ASSESSMENT AND PLAN:   68 year old female with past medical history significant for uncontrolled diabetes, hypertension, systolic CHF, nonischemic cardiomyopathy presents to hospital secondary to weakness and noted to be hyperglycemic.  1.  Uncontrolled diabetes mellitus with hyperglycemia-patient's endocrinologist from [place]. -She has been on the concentrated U500 regular insulin with  meals regimen for more than a year now. -  Her A1c now is 10.9 which is improved from 15.5 about 3 months ago -Appreciate diabetes coordinator input.  - Continue U500 insulin with meals at discharge as well.  Patient is hesitant to be started on any long-acting insulin without her endocrinologist approval.  She would like to follow-up as outpatient. -She has also been getting resistant sliding scale in the hospital.  Changed to moderate sliding scale given hypoglycemia symptoms when sugars were in the 140 range. -Will need outpatient follow-up with endocrinology  2.  Left leg pain-significant swelling in the left knee popliteal fossa and 1+ edema of the left leg.  Ultrasound done last week showing dissected Baker's cyst. -Appreciate orthopedics consult.  Concern for gouty arthritis.  Aspiration done and Kenalog injection given.  Patient feels much better and she is able to ambulate  3.  Recent UTI-finished 3 days of Rocephin  4.  Nonischemic cardiomyopathy and chronic systolic CHF-medication related. -Well compensated at this time.  Continue Entresto, Coreg, Aldactone and ivabradine  5.  DVT prophylaxis-Lovenox   PT recommended rehab. -Social worker consulted -Plan to discharge to [facility] once insurance authorization is approved Niece updated over the phone   All the records are reviewed and case discussed with Care Management/Social Workerr. Management plans discussed with the patient, family and they are in agreement.  CODE STATUS: Full code  TOTAL TIME TAKING CARE OF THIS PATIENT: 37 minutes.   POSSIBLE D/C IN 2 DAYS, DEPENDING ON CLINICAL CONDITION.   Gladstone Lighter M.D on 07/11/2019 at 10:58 AM  Between 7am to 6pm - Pager - [phone]  After 6pm go to [email or web address] - password [facility]  Office  [phone]  CC: Primary care physician; Owens Loffler, MD

## 2019-07-11 NOTE — Progress Notes (Signed)
Physical Therapy Treatment Patient Details Name: Kristy [name] MRN: [id number] DOB: 04-Dec-1950 Today's Date: 07/11/2019    History of Present Illness 69 yo female presented to ED after progressing weakness which she thought was DKA, pt treated in ED few days prior complaining of pain in left leg thought to be bakers cyst and mild UTI, labs in ED noted to have uncontrolled diabetes with hyperglycemia. PMH includes HTN, uncontrolled DM, chronic systemic HF, chrohns disease, nonischemic cardiomyopathy    PT Comments    Pt tolerates session well today reporting much improved knee/LLE pain. Pt performs bed mobility with mod I, transfers sit>stand with min A/CGA initially for balance though requiring decreased assistance with secondary attempts. Pt ambulates 200' with use of RW requiring min A for balance and safety, and cuing to maintain close proximity to RW for stability and for improved L heel strike, with ability to demo improvement. Pt with mild loss of balance posteriorly during standing marches but able to self-correct with support of RW. Pt will continue to benefit from skilled PT intervention for improved strength and balance to promote safe functional mobility and decrease risk of falls.   Follow Up Recommendations  SNF     Equipment Recommendations  Rolling walker with 5" wheels    Recommendations for Other Services       Precautions / Restrictions Precautions Precautions: Fall Restrictions Weight Bearing Restrictions: No    Mobility  Bed Mobility Overal bed mobility: Independent                Transfers Overall transfer level: Needs assistance Equipment used: Rolling walker (2 wheeled) Transfers: Sit to/from Stand Sit to Stand: Min assist;Min guard         General transfer comment: Min A with initial trial; decreasing assistance to CGA with repeated attempts  Ambulation/Gait Ambulation/Gait assistance: Min guard Gait Distance (Feet): 200 Feet Assistive  device: Rolling walker (2 wheeled) Gait Pattern/deviations: Step-through pattern;Narrow base of support;Decreased dorsiflexion - left     General Gait Details: Tends to push RW ahead - cues to increase proximity to RW; LEs remained positioned in slight ER throughout; cues to improve heel strike on L   Stairs             Wheelchair Mobility    Modified Rankin (Stroke Patients Only)       Balance Overall balance assessment: Needs assistance;History of Falls Sitting-balance support: Feet supported Sitting balance-Leahy Scale: Good Sitting balance - Comments: steady sitting EOB   Standing balance support: Bilateral upper extremity supported Standing balance-Leahy Scale: Fair Standing balance comment: Pt with mild posterior LOB with standing marching; able to self correct with support of RW               High Level Balance Comments: Pt with mild posterior LOB with standing marching; able to self correct with support of RW            Cognition Arousal/Alertness: Awake/alert Behavior During Therapy: [facility] for tasks assessed/performed Overall Cognitive Status: Within Functional Limits for tasks assessed                                        Exercises Total Joint Exercises Ankle Circles/Pumps: AROM;Both;10 reps Marching in Standing: AROM;Both;Standing General Exercises - Lower Extremity Mini-Sqauts: (sit to stand x5)    General Comments        Pertinent Vitals/Pain Pain Assessment: 0-10  Pain Score: 0-No pain    Home Living                      Prior Function            PT Goals (current goals can now be found in the care plan section) Acute Rehab PT Goals Patient Stated Goal: improve strength and balance PT Goal Formulation: With patient Time For Goal Achievement: 07/23/19 Potential to Achieve Goals: Good Progress towards PT goals: Progressing toward goals    Frequency    Min 2X/week      PT Plan Current plan  remains appropriate    Co-evaluation              AM-PAC PT "6 Clicks" Mobility   Outcome Measure  Help needed turning from your back to your side while in a flat bed without using bedrails?: A Little Help needed moving from lying on your back to sitting on the side of a flat bed without using bedrails?: A Little Help needed moving to and from a bed to a chair (including a wheelchair)?: A Little Help needed standing up from a chair using your arms (e.g., wheelchair or bedside chair)?: A Little Help needed to walk in hospital room?: A Little Help needed climbing 3-5 steps with a railing? : A Lot 6 Click Score: 17    End of Session Equipment Utilized During Treatment: Gait belt Activity Tolerance: Patient tolerated treatment well Patient left: in chair;with chair alarm set   PT Visit Diagnosis: Difficulty in walking, not elsewhere classified (R26.2);Repeated falls (R29.6);Unsteadiness on feet (R26.81)     Time: 3903-0092 PT Time Calculation (min) (ACUTE ONLY): 25 min  Charges:  $Gait Training: 8-22 mins $Therapeutic Exercise: 8-22 mins                     Petra Kuba, PT, DPT 07/11/19, 1:31 PM    Madilyn Hook 07/11/2019, 1:27 PM

## 2019-07-11 NOTE — Care Management Important Message (Signed)
Important Message  Patient Details  Name: Kristy [name] MRN: [id number] Date of Birth: March 12, 1951   Medicare Important Message Given:  Yes     [name] 07/11/2019, 12:31 PM

## 2019-07-12 LAB — URINALYSIS, COMPLETE (UACMP) WITH MICROSCOPIC
Bacteria, UA: NONE SEEN
Bilirubin Urine: NEGATIVE
Glucose, UA: 50 mg/dL — AB
Hgb urine dipstick: NEGATIVE
Ketones, ur: NEGATIVE mg/dL
Nitrite: NEGATIVE
Protein, ur: NEGATIVE mg/dL
Specific Gravity, Urine: 1.01 (ref 1.005–1.030)
WBC, UA: >50 WBC/hpf — ABNORMAL HIGH (ref 0–5)
pH: 6 (ref 5.0–8.0)

## 2019-07-12 LAB — GLUCOSE, CAPILLARY
Glucose-Capillary: 215 mg/dL — ABNORMAL HIGH (ref 70–99)
Glucose-Capillary: 267 mg/dL — ABNORMAL HIGH (ref 70–99)
Glucose-Capillary: 198 mg/dL — ABNORMAL HIGH (ref 70–99)
Glucose-Capillary: 290 mg/dL — ABNORMAL HIGH (ref 70–99)

## 2019-07-12 NOTE — Progress Notes (Signed)
[facility] at [place] NAME: Kristy [name]    MR#:  [id number]  DATE OF BIRTH:  [birth year]  SUBJECTIVE:  CHIEF COMPLAINT:   Chief Complaint  Patient presents with  . Hyperglycemia   -Feels better, continues to report hypoglycemia symptoms whenever the sugars were less than 180.  Occasionally refusing sliding scale. -Left knee pain is much improved and is able to bear weight.  REVIEW OF SYSTEMS:  Review of Systems  Constitutional: Negative for chills, fever and malaise/fatigue.  HENT: Negative for hearing loss, nosebleeds and tinnitus.   Eyes: Negative for blurred vision and double vision.  Respiratory: Negative for cough, shortness of breath and wheezing.   Cardiovascular: Negative for chest pain, palpitations and leg swelling.  Gastrointestinal: Negative for abdominal pain, constipation, diarrhea, nausea and vomiting.  Genitourinary: Negative for dysuria.  Musculoskeletal: Positive for falls. Negative for joint pain.  Neurological: Negative for dizziness, seizures, weakness and headaches.  Psychiatric/Behavioral: Negative for depression and suicidal ideas.    DRUG ALLERGIES:   Allergies  Allergen Reactions  . Fish Oil Other (See Comments)    Gout  . Glimepiride     REACTION: hypoglycemia  . Guanfacine Hcl     REACTION: unspecified  . Rosiglitazone Other (See Comments)    CHF    VITALS:  Blood pressure (!) 157/68, pulse 68, temperature 98.4 F (36.9 C), temperature source Oral, resp. rate 16, height 5' 3"  (1.6 m), weight 68 kg, SpO2 98 %.  PHYSICAL EXAMINATION:  Physical Exam   GENERAL:  68 y.o.-year-old patient lying in the bed with no acute distress.  EYES: Pupils equal, round, reactive to light and accommodation. No scleral icterus. Extraocular muscles intact.  HEENT: Head atraumatic, normocephalic. Oropharynx and nasopharynx clear.  NECK:  Supple, no jugular venous distention. No thyroid enlargement, no tenderness.   LUNGS: Normal breath sounds bilaterally, no wheezing, rales,rhonchi or crepitation. No use of accessory muscles of respiration. Decreased bibasilar breath sounds CARDIOVASCULAR: S1, S2 normal. No murmurs, rubs, or gallops.  ABDOMEN: Soft, nontender, nondistended. Bowel sounds present. No organomegaly or mass.  EXTREMITIES: Left knee pain and swelling much improved.  Left popliteal fossa swelling and firmness noted likely secondary to Baker's cyst..  No  cyanosis, or clubbing.  Patient is able to bear weight NEUROLOGIC: Cranial nerves II through XII are intact. Muscle strength 5/5 in all extremities. Sensation intact. Gait not checked.  Generalized weakness. PSYCHIATRIC: The patient is alert and oriented x 3.  SKIN: No obvious rash, lesion, or ulcer.    LABORATORY PANEL:   CBC Recent Labs  Lab 07/09/19 0514  WBC 10.4  HGB 10.3*  HCT 31.3*  PLT 305   ------------------------------------------------------------------------------------------------------------------  Chemistries  Recent Labs  Lab 07/11/19 0508  NA 141  K 3.4*  CL 113*  CO2 20*  GLUCOSE 170*  BUN 16  CREATININE 0.67  CALCIUM 8.6*   ------------------------------------------------------------------------------------------------------------------  Cardiac Enzymes No results for input(s): TROPONINI in the last 168 hours. ------------------------------------------------------------------------------------------------------------------  RADIOLOGY:  No results found.  EKG:   Orders placed or performed during the hospital encounter of 07/08/19  . ED EKG  . ED EKG  . EKG 12-Lead  . EKG 12-Lead    ASSESSMENT AND PLAN:   68 year old female with past medical history significant for uncontrolled diabetes, hypertension, systolic CHF, nonischemic cardiomyopathy presents to hospital secondary to weakness and noted to be hyperglycemic.  1.  Uncontrolled diabetes mellitus with hyperglycemia-patient's  endocrinologist from [place]. -She has been  on the concentrated U500 regular insulin with meals regimen for more than a year now. -Her A1c now is 10.9 which is improved from 15.5 about 3 months ago -Appreciate diabetes coordinator input.  - Continue U500 insulin with meals at discharge as well.  Patient is hesitant to be started on any long-acting insulin without her endocrinologist approval.  She would like to follow-up as outpatient. -She has also been getting resistant sliding scale in the hospital.  Changed to moderate sliding scale given hypoglycemia symptoms when sugars were less than 180. -Will need outpatient follow-up with endocrinology  2.  Left leg pain-significant swelling in the left knee popliteal fossa and 1+ edema of the left leg.  Ultrasound done last week showing dissected Baker's cyst. -Appreciate orthopedics consult.  Concern for gouty arthritis.  Aspirated fluid with no crystals noted though.  No infection identified.  Aspiration done and Kenalog injection given.  Patient feels much better and she is able to ambulate  3.  Recent UTI-finished 3 days of Rocephin -Complains of dysuria again.  Please recheck urine analysis and reculture.  4.  Nonischemic cardiomyopathy and chronic systolic CHF-medication related. -Well compensated at this time.  Continue Entresto, Coreg, Aldactone and ivabradine  5.  DVT prophylaxis-Lovenox   PT recommended rehab. -Social worker consulted -Plan to discharge to [facility] once insurance authorization is approved Niece updated over the phone yesterday   All the records are reviewed and case discussed with Care Management/Social Workerr. Management plans discussed with the patient, family and they are in agreement.  CODE STATUS: Full code  TOTAL TIME TAKING CARE OF THIS PATIENT: 34 minutes.   POSSIBLE D/C IN 2 DAYS, DEPENDING ON CLINICAL CONDITION.   Gladstone Lighter M.D on 07/12/2019 at 10:18 AM  Between 7am to 6pm -  Pager - [phone]  After 6pm go to [email or web address] - password [email or web address]  Office  [phone]  CC: Primary care physician; Owens Loffler, MD

## 2019-07-12 NOTE — Progress Notes (Signed)
Allowed patient to give herself her home insulin pen shot and when she handed it back to me I removed the needle by unscrewing it but the clear cap stayed inside insulin pen top. Tried to remove plastic cap from top but could not. Informed charge nurse Eustaquio Maize, RN as well.

## 2019-07-13 LAB — GLUCOSE, CAPILLARY
Glucose-Capillary: 173 mg/dL — ABNORMAL HIGH (ref 70–99)
Glucose-Capillary: 351 mg/dL — ABNORMAL HIGH (ref 70–99)
Glucose-Capillary: 233 mg/dL — ABNORMAL HIGH (ref 70–99)
Glucose-Capillary: 60 mg/dL — ABNORMAL LOW (ref 70–99)
Glucose-Capillary: 61 mg/dL — ABNORMAL LOW (ref 70–99)
Glucose-Capillary: 83 mg/dL (ref 70–99)

## 2019-07-13 LAB — BODY FLUID CULTURE
Culture: NO GROWTH
Special Requests: NORMAL

## 2019-07-13 NOTE — Progress Notes (Signed)
[facility] [place] NAME: Kristy [name]    MR#:  [id number]  DATE OF BIRTH:  Sep 13, 1951  SUBJECTIVE:  CHIEF COMPLAINT:   Chief Complaint  Patient presents with   Hyperglycemia   -Doing well, awaiting rehab placement at this time  REVIEW OF SYSTEMS:  Review of Systems  Constitutional: Negative for chills, fever and malaise/fatigue.  HENT: Negative for hearing loss, nosebleeds and tinnitus.   Eyes: Negative for blurred vision and double vision.  Respiratory: Negative for cough, shortness of breath and wheezing.   Cardiovascular: Negative for chest pain, palpitations and leg swelling.  Gastrointestinal: Negative for abdominal pain, constipation, diarrhea, nausea and vomiting.  Genitourinary: Negative for dysuria.  Musculoskeletal: Positive for falls. Negative for joint pain.  Neurological: Negative for dizziness, seizures, weakness and headaches.  Psychiatric/Behavioral: Negative for depression and suicidal ideas.    DRUG ALLERGIES:   Allergies  Allergen Reactions   Fish Oil Other (See Comments)    Gout   Glimepiride     REACTION: hypoglycemia   Guanfacine Hcl     REACTION: unspecified   Rosiglitazone Other (See Comments)    CHF    VITALS:  Blood pressure (!) 150/59, pulse 67, temperature 98.6 F (37 C), temperature source Oral, resp. rate 16, height 5' 3"  (1.6 m), weight 68 kg, SpO2 93 %.  PHYSICAL EXAMINATION:  Physical Exam   GENERAL:  68 y.o.-year-old patient lying in the bed with no acute distress.  EYES: Pupils equal, round, reactive to light and accommodation. No scleral icterus. Extraocular muscles intact.  HEENT: Head atraumatic, normocephalic. Oropharynx and nasopharynx clear.  NECK:  Supple, no jugular venous distention. No thyroid enlargement, no tenderness.  LUNGS: Normal breath sounds bilaterally, no wheezing, rales,rhonchi or crepitation. No use of accessory muscles of respiration. Decreased  bibasilar breath sounds CARDIOVASCULAR: S1, S2 normal. No murmurs, rubs, or gallops.  ABDOMEN: Soft, nontender, nondistended. Bowel sounds present. No organomegaly or mass.  EXTREMITIES: Left knee pain and swelling much improved.  Left popliteal fossa swelling and firmness noted likely secondary to Baker's cyst..  No  cyanosis, or clubbing.  Patient is able to bear weight NEUROLOGIC: Cranial nerves II through XII are intact. Muscle strength 5/5 in all extremities. Sensation intact. Gait not checked.  Generalized weakness. PSYCHIATRIC: The patient is alert and oriented x 3.  SKIN: No obvious rash, lesion, or ulcer.    LABORATORY PANEL:   CBC Recent Labs  Lab 07/09/19 0514  WBC 10.4  HGB 10.3*  HCT 31.3*  PLT 305   ------------------------------------------------------------------------------------------------------------------  Chemistries  Recent Labs  Lab 07/11/19 0508  NA 141  K 3.4*  CL 113*  CO2 20*  GLUCOSE 170*  BUN 16  CREATININE 0.67  CALCIUM 8.6*   ------------------------------------------------------------------------------------------------------------------  Cardiac Enzymes No results for input(s): TROPONINI in the last 168 hours. ------------------------------------------------------------------------------------------------------------------  RADIOLOGY:  No results found.  EKG:   Orders placed or performed during the hospital encounter of 07/08/19   ED EKG   ED EKG   EKG 12-Lead   EKG 12-Lead    ASSESSMENT AND PLAN:   68 year old female with past medical history significant for uncontrolled diabetes, hypertension, systolic CHF, nonischemic cardiomyopathy presents to hospital secondary to weakness and noted to be hyperglycemic.  1.  Uncontrolled diabetes mellitus with hyperglycemia-patient's endocrinologist from [place]. -She has been on the concentrated U500 regular insulin with meals regimen for more than a year now. -Her A1c now is  10.9 which is improved  from 15.5 about 3 months ago -Appreciate diabetes coordinator input.  - Continue U500 insulin with meals at discharge as well.  Patient is hesitant to be started on any long-acting insulin without her endocrinologist approval.  She would like to follow-up as outpatient. -She has also been getting resistant sliding scale in the hospital.  Changed to moderate sliding scale given hypoglycemia symptoms when sugars were less than 180. -Will need outpatient follow-up with endocrinology  2.  Left leg pain-significant swelling in the left knee popliteal fossa and 1+ edema of the left leg.  Ultrasound done last week showing dissected Baker's cyst. -Appreciate orthopedics consult.  Concern for gouty arthritis.  Aspirated fluid with no crystals noted though.  No infection identified.  Aspiration done and Kenalog injection given.  Patient feels much better and she is able to ambulate  3.  Recent UTI-finished 3 days of Rocephin -Complains of dysuria again.  Please recheck urine analysis and reculture.  4.  Nonischemic cardiomyopathy and chronic systolic CHF-medication related. -Well compensated at this time.  Continue Entresto, Coreg, Aldactone and ivabradine  5.  DVT prophylaxis-Lovenox   PT recommended rehab. -Social worker consulted -Plan to discharge to [facility] once insurance authorization is approved   All the records are reviewed and case discussed with Care Management/Social Workerr. Management plans discussed with the patient, family and they are in agreement.  CODE STATUS: Full code  TOTAL TIME TAKING CARE OF THIS PATIENT: 33 minutes.   POSSIBLE D/C IN 1-2 DAYS, DEPENDING ON CLINICAL CONDITION.   Gladstone Lighter M.D on 07/13/2019 at 10:12 AM  Between 7am to 6pm - Pager - [phone]  After 6pm go to [email or web address] - password [facility]  Office  [phone]  CC: Primary care physician; Owens Loffler, MD

## 2019-07-14 LAB — BASIC METABOLIC PANEL
Anion gap: 7 (ref 5–15)
BUN: 14 mg/dL (ref 8–23)
CO2: 26 mmol/L (ref 22–32)
Calcium: 8.9 mg/dL (ref 8.9–10.3)
Chloride: 104 mmol/L (ref 98–111)
Creatinine, Ser: 0.59 mg/dL (ref 0.44–1.00)
GFR calc Af Amer: >60 mL/min (ref >60)
GFR calc non Af Amer: >60 mL/min (ref >60)
Glucose, Bld: 218 mg/dL — ABNORMAL HIGH (ref 70–99)
Potassium: 3.5 mmol/L (ref 3.5–5.1)
Sodium: 137 mmol/L (ref 135–145)

## 2019-07-14 LAB — GLUCOSE, CAPILLARY
Glucose-Capillary: 151 mg/dL — ABNORMAL HIGH (ref 70–99)
Glucose-Capillary: 216 mg/dL — ABNORMAL HIGH (ref 70–99)
Glucose-Capillary: 321 mg/dL — ABNORMAL HIGH (ref 70–99)

## 2019-07-14 MED ORDER — TRAMADOL HCL 50 MG PO TABS: 50.0000 mg | ORAL_TABLET | Freq: Three times a day (TID) | ORAL | 0 refills | Status: AC | PRN

## 2019-07-14 MED ORDER — HUMULIN R U-500 KWIKPEN 500 UNIT/ML ~~LOC~~ SOPN: 50.0000 [IU] | PEN_INJECTOR | Freq: Three times a day (TID) | SUBCUTANEOUS | 3 refills | Status: DC

## 2019-07-14 NOTE — TOC Transition Note (Signed)
Transition of Care [facility]) - CM/SW Discharge Note   Patient Details  Name: Kristy [name] MRN: [id number] Date of Birth: [birth year]  Transition of Care [facility]) CM/SW Contact:  Beverly Sessions, RN Phone Number: 07/14/2019, 4:03 PM   Clinical Narrative:    Patient to discharge today PT has followed up and patient no longer will require SNF    Per Magda Paganini at [facility] denied SNF level of care  Patient agreeable to home health services.  States that she does not have a preference of home health agency.   Referral made and accepted by [place] at [facility].   Niece Katie notified and to transport at discharge    Final next level of care: [place] Barriers to Discharge: Barriers Resolved   Patient Goals and CMS Choice   CMS Medicare.gov Compare Post Acute Care list provided to:: Patient Choice offered to / list presented to : Patient  Discharge Placement                  Name of family member notified: Katie Patient and family notified of of transfer: 07/14/19  Discharge Plan and Services   Discharge Planning Services: CM Consult Post Acute Care Choice: [place]: PT, Social Work, RN [facility] Agency: [facility] Date [place]: 07/14/19   Representative spoke with at [place]: [place] ([place]) Interventions     Readmission Risk Interventions Readmission Risk Prevention Plan 04/28/2019 04/27/2019  Post Dischage Appt - Complete  Medication Screening - Complete  Transportation Screening - Complete  PCP or Specialist Appt within 5-7 Days Complete -  Home Care Screening Complete -  Medication Review (RN CM) Complete -  Some recent data might be hidden

## 2019-07-14 NOTE — Progress Notes (Signed)
Discharge order received. Patient mental status is at baseline. Vital signs stable . No signs of acute distress. Discharge instructions given to patient and niece. Patient verbalized understanding. No other issues noted at this time.

## 2019-07-14 NOTE — Care Management Important Message (Signed)
Important Message  Patient Details  Name: Kristy [name] MRN: [id number] Date of Birth: June 04, 1951   Medicare Important Message Given:  Yes     [name] 07/14/2019, 11:50 AM

## 2019-07-14 NOTE — Progress Notes (Signed)
Physical Therapy Treatment Patient Details Name: Kristy [name] MRN: [id number] DOB: Jan 19, 1951 Today's Date: 07/14/2019    History of Present Illness 68 yo female presented to ED after progressing weakness which she thought was DKA, pt treated in ED few days prior complaining of pain in left leg thought to be bakers cyst and mild UTI, labs in ED noted to have uncontrolled diabetes with hyperglycemia. PMH includes HTN, uncontrolled DM, chronic systemic HF, chrohns disease, nonischemic cardiomyopathy    PT Comments    Pt is making good progress towards goals with less pain this date, able to WB through B LEs and demonstrates safe technique with ambulation with use of RW. No LOB noted and good strength assessed. Able to perform 5 Time sit<>stand in 9 seconds demonstrating improved power and strength. Able to ambulate to bathroom with safe technique, indep with hygiene. Will continue to progress as able.   Follow Up Recommendations  Outpatient PT     Equipment Recommendations  None recommended by PT    Recommendations for Other Services       Precautions / Restrictions Precautions Precautions: Fall Restrictions Weight Bearing Restrictions: No LLE Weight Bearing: Weight bearing as tolerated    Mobility  Bed Mobility               General bed mobility comments: received seated at EOB, bed mobility not performed  Transfers Overall transfer level: Needs assistance Equipment used: None Transfers: Sit to/from Stand Sit to Stand: Supervision         General transfer comment: safe technique without AD.  Ambulation/Gait Ambulation/Gait assistance: Supervision Gait Distance (Feet): 200 Feet Assistive device: Rolling walker (2 wheeled) Gait Pattern/deviations: Step-through pattern     General Gait Details: ambulated with minimal cues for sequencing including safe distance from RW. Upright posture and no fatigue. Reports soreness in B knees.   Stairs              Wheelchair Mobility    Modified Rankin (Stroke Patients Only)       Balance Overall balance assessment: Mild deficits observed, not formally tested Sitting-balance support: Feet supported Sitting balance-Leahy Scale: Good     Standing balance support: Bilateral upper extremity supported Standing balance-Leahy Scale: Good                              Cognition Arousal/Alertness: Awake/alert Behavior During Therapy: WFL for tasks assessed/performed Overall Cognitive Status: Within Functional Limits for tasks assessed                                        Exercises Other Exercises Other Exercises: ambulated to bathroom with supervision and RW. Safe technique. Able to transfer on/off low toilet and perform hygiene independently. Other Exercises: Performed 5TSTS in 9 seconds without use of hands. safe technique Other Exercises: Performed alt. toe taps on lower shelf with B hands support simulating stairs. Supervision and safe technique. 10 reps    General Comments        Pertinent Vitals/Pain Pain Assessment: No/denies pain    Home Living                      Prior Function            PT Goals (current goals can now be found in the care plan section) Acute Rehab  PT Goals Patient Stated Goal: improve strength and balance PT Goal Formulation: With patient Time For Goal Achievement: 07/23/19 Potential to Achieve Goals: Good Progress towards PT goals: Progressing toward goals    Frequency    Min 2X/week      PT Plan Current plan remains appropriate    Co-evaluation              AM-PAC PT "6 Clicks" Mobility   Outcome Measure  Help needed turning from your back to your side while in a flat bed without using bedrails?: None Help needed moving from lying on your back to sitting on the side of a flat bed without using bedrails?: None Help needed moving to and from a bed to a chair (including a wheelchair)?:  None Help needed standing up from a chair using your arms (e.g., wheelchair or bedside chair)?: None Help needed to walk in hospital room?: None Help needed climbing 3-5 steps with a railing? : A Little 6 Click Score: 23    End of Session Equipment Utilized During Treatment: Gait belt Activity Tolerance: Patient tolerated treatment well Patient left: in bed;with bed alarm set Nurse Communication: Mobility status PT Visit Diagnosis: Difficulty in walking, not elsewhere classified (R26.2);Repeated falls (R29.6);Unsteadiness on feet (R26.81)     Time: 1735-6701 PT Time Calculation (min) (ACUTE ONLY): 15 min  Charges:  $Gait Training: 8-22 mins                     Greggory Stallion, PT, DPT [phone]    Kristy [name] 07/14/2019, 11:42 AM

## 2019-07-14 NOTE — Discharge Summary (Signed)
[facility] [place] NAME: Kristy [name]    MR#:  [id number]  DATE OF BIRTH:  [birth year]  DATE OF ADMISSION:  07/08/2019   ADMITTING PHYSICIAN: Henreitta Leber, MD  DATE OF DISCHARGE: 07/14/2019  PRIMARY CARE PHYSICIAN: Owens Loffler, MD   ADMISSION DIAGNOSIS:   Dehydration [E86.0] Weakness [R53.1] Hyperglycemia [R73.9] Urinary tract infection without hematuria, site unspecified [N39.0]  DISCHARGE DIAGNOSIS:   Active Problems:   UTI (urinary tract infection)   SECONDARY DIAGNOSIS:   Past Medical History:  Diagnosis Date  . Allergic rhinitis   . Allergy   . Cataract    mild   . Crohn's disease ([facility]) 10/13/2013  . Diverticulosis   . GERD (gastroesophageal reflux disease)   . Gout   . HFrEF (heart failure with reduced ejection fraction) ([place])    a. 12/2008 Cath: EF 45% w/ inf HK; b. 07/2009 Echo: EF 50-55%; c. 08/2018 Echo: EF 20-25%.  Marland Kitchen Hyperlipidemia   . Hypertension   . IBS (irritable bowel syndrome)   . Left bundle branch block   . Neuromuscular disorder (HCC)    neuropathy  . NICM (nonischemic cardiomyopathy) ([place])    a. 12/2008 Cath: no significant dzs, EF 45% w/ inf HK->Med Rx; b. 07/2009 Echo: EF 50-55%; c. 08/2018 Echo: EF 20-25%, ant/antsept HK, mild MR, mildly dil LA, nl RV fx; d. 08/2018 Cath: D1 80, otw nonobs dzs->Med rx.  . Non-obstructive CAD (coronary artery disease)    a. 12/2008 Cath: no significant dzs, EF 45% w/ inf HK->Med Rx; b. 08/2018 Cath: LM nl, LAD min irregs, D1 80, LCX 58md, RCA min irregs->Med Rx.  . Osteoarthritis   . Poorly controlled Diabetes mellitus    a. 07/2018 A1c 13.1.  .Marland KitchenSymptomatic cholelithiasis   . Uncontrolled type 2 diabetes mellitus with stage 3 chronic kidney disease, with long-term current use of insulin ([facility]          HOSPITAL COURSE:   68year old female with past medical history significant for uncontrolled diabetes, hypertension, systolic CHF, nonischemic  cardiomyopathy presents to hospital secondary to weakness and noted to be hyperglycemic.  1.  Uncontrolled diabetes mellitus with hyperglycemia-[place]ocr[place] -She has been on the concentrated U500 regular insulin with meals regimen for more than a year now. -Her A1c now is 10.9 which is improved from 15.5 about 3 months ago -Appreciate diabetes coordinator input.  - Continue U500 insulin with meals at discharge as well.  Patient is hesitant to be started on any long-acting insulin without her endocrinologist approval.  She would like to follow-up as outpatient. -She received NovoLog sliding scale in the hospital as well.  She refused to take sliding scale at discharge.  Sugars have been around 200 mostly during the stay. -Will need outpatient follow-up with endocrinology  2.  Left leg pain-significant swelling in the left knee popliteal fossa and 1+ edema of the left leg.  Ultrasound done last week showing dissected Baker's cyst. -Appreciate orthopedics consult.  Concern for gouty arthritis.  Aspirated fluid with no crystals noted though.  No infection identified.  Aspiration done and Kenalog injection given.  Patient feels much better and she is able to ambulate  3.  Recent UTI-finished 3 days of Rocephin  4.  Nonischemic cardiomyopathy and chronic systolic CHF-medication related. -Well compensated at this time.  Continue Entresto, Coreg, Aldactone and ivabradine   PT initially recommended rehab, however after the knee steroid injection, her gait and pain have  significantly improved.  Repeat recommendation is outpatient PT. -Patient will be discharged home today with home health social worker and nurse  DISCHARGE CONDITIONS:   Guarded CONSULTS OBTAINED:   Treatment Team:  Hessie Knows, MD  DRUG ALLERGIES:   Allergies  Allergen Reactions  . Fish Oil Other (See Comments)    Gout  . Glimepiride     REACTION: hypoglycemia  . Guanfacine Hcl      REACTION: unspecified  . Rosiglitazone Other (See Comments)    CHF   DISCHARGE MEDICATIONS:   Allergies as of 07/14/2019      Reactions   Fish Oil Other (See Comments)   Gout   Glimepiride    REACTION: hypoglycemia   Guanfacine Hcl    REACTION: unspecified   Rosiglitazone Other (See Comments)   CHF      Medication List    STOP taking these medications   cephALEXin 500 MG capsule Commonly known as: KEFLEX   chlorpheniramine 4 MG tablet Commonly known as: CHLOR-TRIMETON   metroNIDAZOLE 0.75 % gel Commonly known as: METROGEL   NONFORMULARY OR COMPOUNDED ITEM     TAKE these medications   acetaminophen 325 MG tablet Commonly known as: TYLENOL Take 325-650 mg by mouth daily as needed for moderate pain or headache.   aspirin 81 MG EC tablet Take 1 tablet (81 mg total) by mouth daily.   atorvastatin 80 MG tablet Commonly known as: LIPITOR Take 1 tablet (80 mg total) by mouth daily at 6 PM.   carvedilol 12.5 MG tablet Commonly known as: COREG Take 1 tablet (12.5 mg total) by mouth 2 (two) times daily.   clopidogrel 75 MG tablet Commonly known as: PLAVIX Take 1 tablet (75 mg total) by mouth daily with breakfast.   Corlanor 5 MG Tabs tablet Generic drug: ivabradine TAKE ONE TABLET TWICE DAILY WITH MEALS What changed: See the new instructions.   Entresto 49-51 MG Generic drug: sacubitril-valsartan TAKE ONE TABLET TWICE DAILY STARTING SUNDAY 09/15/18 What changed: See the new instructions.   FLUoxetine 20 MG capsule Commonly known as: PROZAC Take 1 capsule (20 mg total) by mouth daily.   HumuLIN R U-500 KwikPen 500 UNIT/ML kwikpen Generic drug: insulin regular human CONCENTRATED Inject 50-100 Units into the skin 4 (four) times daily -  before meals and at bedtime. Give 0 units insulin for blood sugar < 200  Give 20 units insulin for blood sugar 201 - 300 Give 50 units insulin for blood sugar 301 - 400 Give 90 units insulin for blood sugar 401 - 500 Give  100 units insulin for blood sugar > 500 . What changed:   when to take this  additional instructions   Insulin Syringe-Needle U-100 31G X 5/16" 0.5 ML Misc Commonly known as: B-D INS SYRINGE 0.5CC/31GX5/16 USE AS DIRECTED THREE TIMES A DAY   OneTouch Ultra test strip Generic drug: glucose blood Use to check blood sugar up to 8 times a day.   spironolactone 25 MG tablet Commonly known as: ALDACTONE Take 1 tablet (25 mg total) by mouth daily.   traMADol 50 MG tablet Commonly known as: Ultram Take 1 tablet (50 mg total) by mouth every 8 (eight) hours as needed for up to 7 days for moderate pain or severe pain.        DISCHARGE INSTRUCTIONS:   1.  Endocrinology follow-up in 1 week 2.  PCP follow-up in 1 to 2 weeks  DIET:   Diabetic diet  ACTIVITY:   Activity as tolerated  OXYGEN:  Home Oxygen: No.  Oxygen Delivery: room air  DISCHARGE LOCATION:   home   If you experience worsening of your admission symptoms, develop shortness of breath, life threatening emergency, suicidal or homicidal thoughts you must seek medical attention immediately by calling 911 or calling your MD immediately  if symptoms less severe.  You Must read complete instructions/literature along with all the possible adverse reactions/side effects for all the Medicines you take and that have been prescribed to you. Take any new Medicines after you have completely understood and accpet all the possible adverse reactions/side effects.   Please note  You were cared for by a hospitalist during your hospital stay. If you have any questions about your discharge medications or the care you received while you were in the hospital after you are discharged, you can call the unit and asked to speak with the hospitalist on call if the hospitalist that took care of you is not available. Once you are discharged, your primary care physician will handle any further medical issues. Please note that NO REFILLS for  any discharge medications will be authorized once you are discharged, as it is imperative that you return to your primary care physician (or establish a relationship with a primary care physician if you do not have one) for your aftercare needs so that they can reassess your need for medications and monitor your lab values.    On the day of Discharge:  VITAL SIGNS:   Blood pressure 129/63, pulse 72, temperature 98.3 F (36.8 C), temperature source Oral, resp. rate 19, height 5' 3"  (1.6 m), weight 68 kg, SpO2 97 %.  PHYSICAL EXAMINATION:    GENERAL:  68 y.o.-year-old patient lying in the bed with no acute distress.  EYES: Pupils equal, round, reactive to light and accommodation. No scleral icterus. Extraocular muscles intact.  HEENT: Head atraumatic, normocephalic. Oropharynx and nasopharynx clear.  NECK:  Supple, no jugular venous distention. No thyroid enlargement, no tenderness.  LUNGS: Normal breath sounds bilaterally, no wheezing, rales,rhonchi or crepitation. No use of accessory muscles of respiration. Decreased bibasilar breath sounds CARDIOVASCULAR: S1, S2 normal. No murmurs, rubs, or gallops.  ABDOMEN: Soft, nontender, nondistended. Bowel sounds present. No organomegaly or mass.  EXTREMITIES: Left knee pain and swelling much improved.  Left popliteal fossa swelling and but no tenderness, secondary to Baker's cyst..  No  cyanosis, or clubbing.  Patient is able to bear weight NEUROLOGIC: Cranial nerves II through XII are intact. Muscle strength 5/5 in all extremities. Sensation intact. Gait not checked.  Generalized weakness. PSYCHIATRIC: The patient is alert and oriented x 3.  SKIN: No obvious rash, lesion, or ulcer.   DATA REVIEW:   CBC Recent Labs  Lab 07/09/19 0514  WBC 10.4  HGB 10.3*  HCT 31.3*  PLT 305    Chemistries  Recent Labs  Lab 07/14/19 0618  NA 137  K 3.5  CL 104  CO2 26  GLUCOSE 218*  BUN 14  CREATININE 0.59  CALCIUM 8.9     Microbiology  Results  Results for orders placed or performed during the hospital encounter of 07/08/19  Urine Culture     Status: Abnormal   Collection Time: 07/08/19  5:29 PM   Specimen: Urine, Random  Result Value Ref Range Status   Specimen Description   Final    URINE, RANDOM Performed at [facility], [address] [place], [place] [address]    Special Requests   Final    NONE Performed at [facility]  [facility], [address] [place], [place] [address]    Culture >=100,000 COLONIES/mL YEAST (A)  Final   Report Status 07/10/2019 FINAL  Final  SARS CORONAVIRUS 2 (TAT 6-24 HRS) Nasopharyngeal Nasopharyngeal Swab     Status: None   Collection Time: 07/08/19  7:19 PM   Specimen: Nasopharyngeal Swab  Result Value Ref Range Status   SARS Coronavirus 2 NEGATIVE NEGATIVE Final    Comment: (NOTE) SARS-CoV-2 target nucleic acids are NOT DETECTED. The SARS-CoV-2 RNA is generally detectable in upper and lower respiratory specimens during the acute phase of infection. Negative results do not preclude SARS-CoV-2 infection, do not rule out co-infections with other pathogens, and should not be used as the sole basis for treatment or other patient management decisions. Negative results must be combined with clinical observations, patient history, and epidemiological information. The expected result is Negative. Fact Sheet for Patients: SugarRoll.be Fact Sheet for Healthcare Providers: https://www.woods-mathews.com/ This test is not yet approved or cleared by the [place] FDA and  has been authorized for detection and/or diagnosis of SARS-CoV-2 by FDA under an Emergency Use Authorization (EUA). This EUA will remain  in effect (meaning this test can be used) for the duration of the COVID-19 declaration under Section 56 4(b)(1) of the Act, 21 U.S.C. section 360bbb-3(b)(1), unless the authorization is terminated or revoked sooner. Performed at  [facility], [place] [address], [place], [place] [address]   Body fluid culture     Status: None   Collection Time: 07/09/19  4:30 PM   Specimen: Synovium; Synovial Fluid  Result Value Ref Range Status   Specimen Description   Final    SYNOVIAL Performed at [facility], [address] [place], [place] [address]    Special Requests   Final    Normal Performed at [facility], [address] [place], [place] [address]    Gram Stain   Final    FEW WBC PRESENT,BOTH PMN AND MONONUCLEAR NO ORGANISMS SEEN    Culture   Final    NO GROWTH 3 DAYS Performed at [facility], [place] [address], [place], [place] [address]    Report Status 07/13/2019 FINAL  Final  Anaerobic culture     Status: None (Preliminary result)   Collection Time: 07/09/19  4:30 PM   Specimen: Synovium; Synovial Fluid  Result Value Ref Range Status   Specimen Description   Final    SYNOVIAL Performed at [facility], [address], [place], [place] [address]    Special Requests   Final    Normal Performed at [facility], [address] [place],  [address]    Culture   Final    NO ANAEROBES ISOLATED; CULTURE IN PROGRESS FOR 5 DAYS   Report Status PENDING  Incomplete    RADIOLOGY:  No results found.   Management plans discussed with the patient, family and they are in agreement.  CODE STATUS:     Code Status Orders  (From admission, onward)         Start     Ordered   07/08/19 2028  Full code  Continuous     07/08/19 2027        Code Status History    Date Active Date Inactive Code Status Order ID Comments User Context   04/19/2019 2216 04/28/2019 1910 Full Code [id number]  Loletha Grayer, MD ED   08/26/2018 1151 08/26/2018 1736 Full Code [id number]  Wellington Hampshire, MD  Inpatient   05/14/2018 0401 05/15/2018 1421 Full Code [id number]  Lance Coon, MD Inpatient   Advance Care Planning Activity    Advance Directive Documentation      Most Recent Value  Type of Advance Directive  Healthcare Power of Attorney, Living will  Pre-existing out of facility DNR order (yellow form or pink MOST form)  -  "MOST" Form in Place?  -      TOTAL TIME TAKING CARE OF THIS PATIENT: 38 minutes.    Gladstone Lighter M.D on 07/14/2019 at 1:45 PM  Between 7am to 6pm - Pager - [phone]  After 6pm go to [email or web address] - Proofreader  [facility]  Office  [phone]  CC: Primary care physician; Owens Loffler, MD   Note: This dictation was prepared with Dragon dictation along with smaller phrase technology. Any transcriptional errors that result from this process are unintentional.

## 2019-07-14 NOTE — Progress Notes (Addendum)
Inpatient Diabetes Program Recommendations  AACE/ADA: New Consensus Statement on Inpatient Glycemic Control   Target Ranges:  Prepandial:   less than 140 mg/dL      Peak postprandial:   less than 180 mg/dL (1-2 hours)      Critically ill patients:  140 - 180 mg/dL   Results for [name], [name] (MRN [id number]) as of 07/14/2019 07:56  Ref. Range 07/14/2019 03:28 07/14/2019 07:44  Glucose-Capillary Latest Ref Range: 70 - 99 mg/dL 151 (H) 216 (H)   Results for [name], [name] (MRN [id number]) as of 07/14/2019 07:56  Ref. Range 07/13/2019 07:34 07/13/2019 11:30 07/13/2019 17:03 07/13/2019 21:16 07/13/2019 21:20 07/13/2019 21:37  Glucose-Capillary Latest Ref Range: 70 - 99 mg/dL 173 (H)  Novolog 4 units 351 (H)  Novolog 20 units  U500 50 units 233 (H)  Novolog 7 units  U500 20 units 60 (L) 61 (L) 83   Review of Glycemic Control  Diabetes history: DM2 Outpatient Diabetes medications: Humulin R U500 Insulin per SSI <200- 0 units                              201-300- 20 units  300-400- 50 units  401-500- 90 units >500- 100 units  Current orders for Inpatient glycemic control: Novolog 0-20 units TID with meals, Novolog 0-5 units QHS, Humulin R U500 50-100 units TID & HS   Give 0 units insulin for blood sugar < 200   Give 20 units insulin for blood sugar 201 - 300  Give 50 units insulin for blood sugar 301 - 400  Give 90 units insulin for blood sugar 401 - 500  Give 100 units insulin for blood sugar > 500     Inpatient Diabetes Program Recommendations:   Insulin-  Please consider discontinuing current range dose of Humulin R U500 and order Humulin R U500 35 units BID (with breakfast and supper).  NOTE: In reviewing chart, noted patient did not receive any U500 insulin yesterday morning with breakfast (since U500 is not given if CBG less than 200 mg/dl). Then  following glucose up to 351 mg/dl at 11:30 on 07/13/19. Glucose noted to be down to 60 mg/dl at 21:16 last night.  Current order for U500 is 50-100 units AC&HS but administration instructions per sliding scale dose range is 0-100 units. Would recommend discontinuing current range dose of U500 and order specific dose to be given BID with breakfast and supper.  Thanks, Barnie Alderman, RN, MSN, CDE Diabetes Coordinator Inpatient Diabetes Program [phone] (Team Pager from 8am to 5pm)

## 2019-07-14 NOTE — Progress Notes (Signed)
[facility] [place] NAME: Kristy [name]    MR#:  [id number]  DATE OF BIRTH:  1950-10-28  SUBJECTIVE:  CHIEF COMPLAINT:   Chief Complaint  Patient presents with  . Hyperglycemia   -Hypoglycemic last evening, improved with orange juice.  Was symptomatic during that time and blood sugar dropped to 60.  Blood sugars are back in the 200s now  REVIEW OF SYSTEMS:  Review of Systems  Constitutional: Negative for chills, fever and malaise/fatigue.  HENT: Negative for hearing loss, nosebleeds and tinnitus.   Eyes: Negative for blurred vision and double vision.  Respiratory: Negative for cough, shortness of breath and wheezing.   Cardiovascular: Negative for chest pain, palpitations and leg swelling.  Gastrointestinal: Negative for abdominal pain, constipation, diarrhea, nausea and vomiting.  Genitourinary: Negative for dysuria.  Musculoskeletal: Positive for falls. Negative for joint pain.  Neurological: Negative for dizziness, seizures, weakness and headaches.  Psychiatric/Behavioral: Negative for depression and suicidal ideas.    DRUG ALLERGIES:   Allergies  Allergen Reactions  . Fish Oil Other (See Comments)    Gout  . Glimepiride     REACTION: hypoglycemia  . Guanfacine Hcl     REACTION: unspecified  . Rosiglitazone Other (See Comments)    CHF    VITALS:  Blood pressure 138/76, pulse 70, temperature 98.5 F (36.9 C), temperature source Oral, resp. rate 18, height 5' 3"  (1.6 m), weight 68 kg, SpO2 93 %.  PHYSICAL EXAMINATION:  Physical Exam   GENERAL:  68 y.o.-year-old patient lying in the bed with no acute distress.  EYES: Pupils equal, round, reactive to light and accommodation. No scleral icterus. Extraocular muscles intact.  HEENT: Head atraumatic, normocephalic. Oropharynx and nasopharynx clear.  NECK:  Supple, no jugular venous distention. No thyroid enlargement, no tenderness.  LUNGS: Normal breath sounds  bilaterally, no wheezing, rales,rhonchi or crepitation. No use of accessory muscles of respiration. Decreased bibasilar breath sounds CARDIOVASCULAR: S1, S2 normal. No murmurs, rubs, or gallops.  ABDOMEN: Soft, nontender, nondistended. Bowel sounds present. No organomegaly or mass.  EXTREMITIES: Left knee pain and swelling much improved.  Left popliteal fossa swelling and firmness noted likely secondary to Baker's cyst..  No  cyanosis, or clubbing.  Patient is able to bear weight NEUROLOGIC: Cranial nerves II through XII are intact. Muscle strength 5/5 in all extremities. Sensation intact. Gait not checked.  Generalized weakness. PSYCHIATRIC: The patient is alert and oriented x 3.  SKIN: No obvious rash, lesion, or ulcer.    LABORATORY PANEL:   CBC Recent Labs  Lab 07/09/19 0514  WBC 10.4  HGB 10.3*  HCT 31.3*  PLT 305   ------------------------------------------------------------------------------------------------------------------  Chemistries  Recent Labs  Lab 07/14/19 0618  NA 137  K 3.5  CL 104  CO2 26  GLUCOSE 218*  BUN 14  CREATININE 0.59  CALCIUM 8.9   ------------------------------------------------------------------------------------------------------------------  Cardiac Enzymes No results for input(s): TROPONINI in the last 168 hours. ------------------------------------------------------------------------------------------------------------------  RADIOLOGY:  No results found.  EKG:   Orders placed or performed during the hospital encounter of 07/08/19  . ED EKG  . ED EKG  . EKG 12-Lead  . EKG 12-Lead    ASSESSMENT AND PLAN:   68 year old female with past medical history significant for uncontrolled diabetes, hypertension, systolic CHF, nonischemic cardiomyopathy presents to hospital secondary to weakness and noted to be hyperglycemic.  1.  Uncontrolled diabetes mellitus with hyperglycemia-patient's endocrinologist from [place]. -She has  been on the concentrated U500  regular insulin with meals regimen for more than a year now. -Her A1c now is 10.9 which is improved from 15.5 about 3 months ago -Appreciate diabetes coordinator input.  - Continue U500 insulin with meals at discharge as well.  Patient is hesitant to be started on any long-acting insulin without her endocrinologist approval.  She would like to follow-up as outpatient. -She has also been getting resistant sliding scale in the hospital.  Changed to moderate sliding scale given hypoglycemia symptoms when sugars were less than 180. -Will need outpatient follow-up with endocrinology  2.  Left leg pain-significant swelling in the left knee popliteal fossa and 1+ edema of the left leg.  Ultrasound done last week showing dissected Baker's cyst. -Appreciate orthopedics consult.  Concern for gouty arthritis.  Aspirated fluid with no crystals noted though.  No infection identified.  Aspiration done and Kenalog injection given.  Patient feels much better and she is able to ambulate  3.  Recent UTI-finished 3 days of Rocephin  4.  Nonischemic cardiomyopathy and chronic systolic CHF-medication related. -Well compensated at this time.  Continue Entresto, Coreg, Aldactone and ivabradine  5.  DVT prophylaxis-Lovenox   PT recommended rehab. -Social worker consulted -Plan to discharge to [facility] once insurance authorization is approved  However patient's ambulation has improved.  PT to really work with her today.  If improved, will go home with home health services   All the records are reviewed and case discussed with Care Management/Social Workerr. Management plans discussed with the patient, family and they are in agreement.  CODE STATUS: Full code  TOTAL TIME TAKING CARE OF THIS PATIENT: 33 minutes.   POSSIBLE D/C IN 1-2 DAYS, DEPENDING ON CLINICAL CONDITION.   Gladstone Lighter M.D on 07/14/2019 at 10:24 AM  Between 7am to 6pm - Pager - [phone]   After 6pm go to [email or web address] - password [facility]  Office  [phone]  CC: Primary care physician; Owens Loffler, MD

## 2019-07-15 ENCOUNTER — Telehealth: Payer: Self-pay | Admitting: *Deleted

## 2019-07-15 ENCOUNTER — Other Ambulatory Visit: Payer: Self-pay

## 2019-07-15 ENCOUNTER — Telehealth: Payer: Self-pay | Admitting: Family Medicine

## 2019-07-15 ENCOUNTER — Other Ambulatory Visit: Payer: Self-pay | Admitting: *Deleted

## 2019-07-15 DIAGNOSIS — R739 Hyperglycemia, unspecified: Secondary | ICD-10-CM

## 2019-07-15 LAB — ANAEROBIC CULTURE: Special Requests: NORMAL

## 2019-07-15 NOTE — Telephone Encounter (Signed)
First attempt for Transitional Care Management call, left message for patient to cal office , will continue to monitor and attempt TCM.

## 2019-07-15 NOTE — Telephone Encounter (Signed)
This is a very challenging situation - she has the greatest insulin needs of any patient I have every worked with.  I will ask Dr. Renne Crigler to see if she would be willing to see her again?

## 2019-07-15 NOTE — Telephone Encounter (Signed)
Received fax from [place] requesting PA on One Touch Ultra Test Strips.  Patient test her blood sugar up to 8 times a day.  PA completed over the phone and approved through 07/13/2020.  GSPJ#[id number].  [facility] notified of approval via fax.

## 2019-07-15 NOTE — Patient Outreach (Signed)
Referral received hospital liason on 07/15/19, pt discharged [facility] on 07/14/19 with DM/ hyperglycemia, UTI, pt with history CHF, pneumonia, depression, CKD stage 3, HTN, gout/ arthritis.  Primary MD office completes transition of care.  Outreach call to pt for screening, no answer to telephone and no option to leave voicemail.  RN CM mailed unsuccessful outreach letter to pt home.  PLAN Outreach pt in 3-4 business days  Jacqlyn Larsen [facility], [place] [phone]

## 2019-07-15 NOTE — Telephone Encounter (Signed)
Karna Dupes NP at [facility] called stating that they saw patient several months ago for diabetes. Mickel Baas stated that patient had a single visit. Mickel Baas is calling to let her PCP know that patient is not returning their calls and not responding to their letters. Mickel Baas stated that since patient lives about an hour away that may be why she is not responding to them. Mickel Baas wanted to let her PCP know this in case he wants to refer her to an endocrinologist that may be closer to her?

## 2019-07-15 NOTE — Telephone Encounter (Signed)
Extremely difficult situation.  I will attempt to find her endocrine help - I do not think I can manage myself.

## 2019-07-15 NOTE — Telephone Encounter (Signed)
[name],  She missed/cancelled multiple appts with me - I will decline to see her back. Kristy [name]

## 2019-07-16 DIAGNOSIS — R69 Illness, unspecified: Secondary | ICD-10-CM | POA: Diagnosis not present

## 2019-07-16 NOTE — Telephone Encounter (Signed)
Left message again-2nd attempt to complete TCM call, has appointment with PCP on 07/21/2019

## 2019-07-16 NOTE — Telephone Encounter (Signed)
She has a Hosp f/u appt with me on Monday, and I will talk to her about this at that time.

## 2019-07-17 NOTE — Telephone Encounter (Signed)
Transition Care Management Follow-up Telephone Call   Date discharged? 07/14/2019   How have you been since you were released from the hospital? " my neck is very sore, not walking very good yet but I am getting there. I have an abcess on my leg but I was told that this would go away.    Do you understand why you were in the hospital? yes   Do you understand the discharge instructions? yes   Where were you discharged to? Home alone   Items Reviewed:  Medications reviewed: yes  Allergies reviewed: yes  Dietary changes reviewed: yes  Referrals reviewed: yes   Functional Questionnaire:   Activities of Daily Living (ADLs):   She states they are independent in the following: ambulation, bathing and hygiene, feeding, continence, grooming, toileting, dressing and "My niece comes in every week to check on me and help me out around the house" States they require assistance with the following: n/a, but is aware of resources explained by the hospital - [facility] services   Any transportation issues/concerns?: no, but someone will bring me to my appointment given limited mobility. Patient is not very stable on her feet yet - crutches/walker   Any patient concerns? no   Confirmed importance and date/time of follow-up visits scheduled yes  Provider Appointment booked with Dr Lorelei Pont on 07/21/2019 at 2pm  Confirmed with patient if condition begins to worsen call PCP or go to the ER.  Patient was given the office number and encouraged to call back with question or concerns.  : yes

## 2019-07-18 ENCOUNTER — Other Ambulatory Visit: Payer: Self-pay | Admitting: *Deleted

## 2019-07-18 NOTE — Patient Outreach (Signed)
Outreach call to pt for screening/  2nd attempt. No answer to telephone and no option to leave voicemail.    PLAN Outreach pt in 3-4 business days  Jacqlyn Larsen [facility], [place] [phone]

## 2019-07-21 ENCOUNTER — Other Ambulatory Visit: Payer: Self-pay

## 2019-07-21 ENCOUNTER — Ambulatory Visit (INDEPENDENT_AMBULATORY_CARE_PROVIDER_SITE_OTHER): Payer: Medicare HMO | Admitting: Family Medicine

## 2019-07-21 ENCOUNTER — Encounter: Payer: Self-pay | Admitting: Family Medicine

## 2019-07-21 VITALS — BP 112/70 | HR 111 | Temp 98.3°F | Ht 62.75 in | Wt 146.0 lb

## 2019-07-21 DIAGNOSIS — N183 Chronic kidney disease, stage 3 unspecified: Secondary | ICD-10-CM

## 2019-07-21 DIAGNOSIS — N3000 Acute cystitis without hematuria: Secondary | ICD-10-CM | POA: Diagnosis not present

## 2019-07-21 DIAGNOSIS — E1165 Type 2 diabetes mellitus with hyperglycemia: Secondary | ICD-10-CM | POA: Diagnosis not present

## 2019-07-21 DIAGNOSIS — M7120 Synovial cyst of popliteal space [Baker], unspecified knee: Secondary | ICD-10-CM

## 2019-07-21 DIAGNOSIS — IMO0002 Reserved for concepts with insufficient information to code with codable children: Secondary | ICD-10-CM

## 2019-07-21 DIAGNOSIS — Z794 Long term (current) use of insulin: Secondary | ICD-10-CM | POA: Diagnosis not present

## 2019-07-21 DIAGNOSIS — E1122 Type 2 diabetes mellitus with diabetic chronic kidney disease: Secondary | ICD-10-CM | POA: Diagnosis not present

## 2019-07-21 DIAGNOSIS — I5023 Acute on chronic systolic (congestive) heart failure: Secondary | ICD-10-CM | POA: Diagnosis not present

## 2019-07-21 DIAGNOSIS — I2510 Atherosclerotic heart disease of native coronary artery without angina pectoris: Secondary | ICD-10-CM

## 2019-07-21 NOTE — Progress Notes (Signed)
Kristy [name] T. Kristy Frame, MD Primary Care and [place] at [facility] [place], [address] Phone: [phone]  FAX: [place] - 68 y.o. female  MRN [id number]  Date of Birth: [birth year]  Visit Date: 07/21/2019  PCP: Owens Loffler, MD  Referred by: Owens Loffler, MD  Chief Complaint  Patient presents with  . Hospitalization Follow-up   Subjective:   Kristy [name] is a 68 y.o. very pleasant female patient who presents with the following:  TCM foll-up DATE OF ADMISSION:  07/08/2019     DATE OF DISCHARGE: 07/14/2019  She was admitted with a UTI.  She also was dehydrated, weak, and hyperglycemia.  Her diabetes is wildly out of control.  She has seen multiple endocrinologist including [name], [facility], as well as Dr. Gabriel Carina in the past.  She also has congestive heart failure.  Her A1c is now 10.9, improved from 15.5 approximately 3 months ago.  She also had a dissecting Baker's cyst, and this fluid was aspirated and due to Kenalog injection by Dr. Rudene Christians. Could not walk. Does not have gout - no crystals.   With UTI, the patient completed 3 days of Rocephin in the hospital.  BS - 300 at home. Wants to keep going to [facility].  Endo at [facility].   Past Medical History, Surgical History, Social History, Family History, Problem List, Medications, and Allergies have been reviewed and updated if relevant.  Patient Active Problem List   Diagnosis Date Noted  . NICM (nonischemic cardiomyopathy) ([place]) 11/08/2018    Priority: High  . Acute on chronic HFrEF (heart failure with reduced ejection fraction) ([place]) 08/22/2018    Priority: High  . Coronary artery disease, non-occlusive 09/13/2016    Priority: High  . Uncontrolled type 2 diabetes mellitus with stage 3 chronic kidney disease, with long-term current use of insulin (HCC)     Priority: High  . Crohn's disease ([place]) 10/13/2013    Priority: Medium  .  Major depressive disorder, recurrent, in full remission ([place]) 05/01/2011    Priority: Medium  . UTI (urinary tract infection) 07/08/2019  . Elevated troponin   . DKA (diabetic ketoacidoses) ([place]) 04/19/2019  . Pneumonia 04/19/2019  . Primary osteoarthritis involving multiple joints 03/14/2015  . Personal history of other malignant neoplasm of skin 12/01/2014  . TRANSAMINASES, SERUM, ELEVATED 05/01/2010  . PSORIASIS 08/02/2009  . Hyperlipidemia 05/11/2009  . GOUT 02/05/2009  . Essential hypertension 04/18/2007  . LBBB (left bundle branch block) 04/18/2007  . ALLERGIC RHINITIS 04/18/2007    Past Medical History:  Diagnosis Date  . Allergic rhinitis   . Allergy   . Cataract    mild   . Crohn's disease ([place]) 10/13/2013  . Diverticulosis   . GERD (gastroesophageal reflux disease)   . Gout   . HFrEF (heart failure with reduced ejection fraction) ([place])    a. 12/2008 Cath: EF 45% w/ inf HK; b. 07/2009 Echo: EF 50-55%; c. 08/2018 Echo: EF 20-25%.  Marland Kitchen Hyperlipidemia   . Hypertension   . IBS (irritable bowel syndrome)   . Left bundle branch block   . Neuromuscular disorder (HCC)    neuropathy  . NICM (nonischemic cardiomyopathy) ([place])    a. 12/2008 Cath: no significant dzs, EF 45% w/ inf HK->Med Rx; b. 07/2009 Echo: EF 50-55%; c. 08/2018 Echo: EF 20-25%, ant/antsept HK, mild MR, mildly dil LA, nl RV fx; d. 08/2018 Cath: D1 80, otw nonobs dzs->Med rx.  Marland Kitchen  Non-obstructive CAD (coronary artery disease)    a. 12/2008 Cath: no significant dzs, EF 45% w/ inf HK->Med Rx; b. 08/2018 Cath: LM nl, LAD min irregs, D1 80, LCX 62md, RCA min irregs->Med Rx.  . Osteoarthritis   . Poorly controlled Diabetes mellitus    a. 07/2018 A1c 13.1.  .Marland KitchenSymptomatic cholelithiasis   . Uncontrolled type 2 diabetes mellitus with stage 3 chronic kidney disease, with long-term current use of insulin ([place]          Past Surgical History:  Procedure Laterality Date  . BREAST BIOPSY Right 06/24/2019   stereo bx/ x  clip/ path pending  . BREAST CYST ASPIRATION    . COLONOSCOPY    . DILATION AND CURETTAGE OF UTERUS    . LEFT HEART CATH AND CORONARY ANGIOGRAPHY N/A 04/24/2019   Procedure: LEFT HEART CATH AND CORONARY ANGIOGRAPHY;  Surgeon: ENelva Bush MD;  [facility];  Service: Cardiovascular;  Laterality: N/A;  . RIGHT/LEFT HEART CATH AND CORONARY ANGIOGRAPHY N/A 08/26/2018   Procedure: RIGHT/LEFT HEART CATH AND CORONARY ANGIOGRAPHY;  Surgeon: AWellington Hampshire MD;  Location: [facility];  Service: Cardiovascular;  Laterality: N/A;  . SHOULDER SURGERY     15 + yrs ago   . SKIN SURGERY     nose   . VAGINAL HYSTERECTOMY      Social History   Socioeconomic History  . Marital status: Widowed    Spouse name: Not on file  . Number of children: Not on file  . Years of education: Not on file  . Highest education level: Not on file  Occupational History  . Not on file  Social Needs  . Financial resource strain: Not on file  . Food insecurity    Worry: Not on file    Inability: Not on file  . Transportation needs    Medical: Not on file    Non-medical: Not on file  Tobacco Use  . Smoking status: Former Smoker    Packs/day: 0.75    Years: 30.00    Pack years: 22.50    Types: Cigarettes    Quit date: 06/06/1997    Years since quitting: 22.1  . Smokeless tobacco: Never Used  Substance and Sexual Activity  . Alcohol use: No  . Drug use: No  . Sexual activity: Not on file  Lifestyle  . Physical activity    Days per week: Not on file    Minutes per session: Not on file  . Stress: Not on file  Relationships  . Social [place] phone: Not on file    Gets together: Not on file    Attends religious service: Not on file    Active member of club or organization: Not on file    Attends meetings of clubs or organizations: Not on file    Relationship status: Not on file  . Intimate partner violence    Fear of current or ex partner: Not on file     Emotionally abused: Not on file    Physically abused: Not on file    Forced sexual activity: Not on file  Other Topics Concern  . Not on file  Social History Narrative  . Not on file    Family History  Problem Relation Age of Onset  . Kidney failure Mother   . Hypertension Father   . Kidney failure Brother   . Colon cancer Neg Hx   . Colon polyps Neg Hx   .  Rectal cancer Neg Hx   . Stomach cancer Neg Hx   . Breast cancer Neg Hx     Allergies  Allergen Reactions  . Fish Oil Other (See Comments)    Gout  . Glimepiride     REACTION: hypoglycemia  . Guanfacine Hcl     REACTION: unspecified  . Rosiglitazone Other (See Comments)    CHF    Medication list reviewed and updated in full in [place].   GEN: No acute illnesses, no fevers, chills. GI: No n/v/d, eating normally Pulm: No SOB Interactive and getting along well at home.  Otherwise, ROS is as per the HPI.  Objective:   BP 112/70   Pulse (!) 111   Temp 98.3 F (36.8 C) (Temporal)   Ht 5' 2.75" (1.594 m)   Wt 146 lb (66.2 kg)   SpO2 97%   BMI 26.07 kg/m   GEN: WDWN, NAD, Non-toxic, A & O x 3 HEENT: Atraumatic, Normocephalic. Neck supple. No masses, No LAD. Ears and Nose: No external deformity. CV: RRR, No M/G/R. No JVD. No thrill. No extra heart sounds. PULM: CTA B, no wheezes, crackles, rhonchi. No retractions. No resp. distress. No accessory muscle use. EXTR: No c/c/e NEURO Normal gait.  PSYCH: Normally interactive. Conversant. Not depressed or anxious appearing.  Calm demeanor.   Laboratory and Imaging Data: Lab Results  Component Value Date   HGBA1C 10.9 (H) 07/08/2019     Physical Labs:  CMP     Component Value Date/Time   NA 137 07/14/2019 0618   NA 135 10/16/2018 1035   K 3.5 07/14/2019 0618   CL 104 07/14/2019 0618   CO2 26 07/14/2019 0618   GLUCOSE 218 (H) 07/14/2019 0618   BUN 14 07/14/2019 0618   BUN 13 10/16/2018 1035   CREATININE 0.59 07/14/2019 0618   CREATININE  0.72 07/27/2014 0902   CALCIUM 8.9 07/14/2019 0618   PROT 7.8 07/03/2019 1152   ALBUMIN 3.8 07/03/2019 1152   AST 18 07/03/2019 1152   ALT 12 07/03/2019 1152   ALKPHOS 73 07/03/2019 1152   BILITOT 1.0 07/03/2019 1152   GFRNONAA >60 07/14/2019 0618   GFRNONAA 89 07/27/2014 0902   GFRAA >60 07/14/2019 0618   GFRAA >89 07/27/2014 0902    Lab Results  Component Value Date   HGBA1C 10.9 (H) 07/08/2019   CBC Latest Ref Rng & Units 07/09/2019 07/08/2019 07/03/2019  WBC 4.0 - 10.5 K/uL 10.4 14.6(H) 14.1(H)  Hemoglobin 12.0 - 15.0 g/dL 10.3(L) 12.2 11.9(L)  Hematocrit 36.0 - 46.0 % 31.3(L) 37.2 37.2  Platelets 150 - 400 K/uL 305 406(H) 211    Lab Results  Component Value Date   CHOL 170 04/22/2019   Lab Results  Component Value Date   HDL 25 (L) 04/22/2019   Lab Results  Component Value Date   LDLCALC 103 (H) 04/22/2019   Lab Results  Component Value Date   TRIG 209 (H) 04/22/2019   Lab Results  Component Value Date   CHOLHDL 6.8 04/22/2019   Lab Results  Component Value Date   TSH 0.586 04/19/2019   No results found for: PSA  Assessment and Plan:     ICD-10-CM   1. Acute cystitis without hematuria  N30.00   2. Acute on chronic HFrEF (heart failure with reduced ejection fraction) (HCC)  I50.23   3. Uncontrolled type 2 diabetes mellitus with stage 3 chronic kidney disease, with long-term current use of insulin (HCC)  E11.22    E11.65  N18.30    Z79.4   4. Coronary artery disease, non-occlusive  I25.10   5. Synovial cyst of knee, unspecified laterality  M71.20    She primarily came to the hospital because her knee was hurting, she could not really walk.  Ultimately this was aspirated by Dr. Sherilyn Banker, and there was no crystals, no crystals suggesting no CPPD or gout.  There was a Baker's cyst, and this was drained intra-articularly with an injection of steroids.  She also had some acute cystitis and was treated with Rocephin x3 days.  Patient has wildly  out-of-control diabetes, her A1c is 10, which is been much better than prior years.  She prefers to go back to [facility] for endocrinology rather than any one in the local area.  Complicated secondary to also some coronary artery disease as well as CHF.  Follow-up: No follow-ups on file.  No orders of the defined types were placed in this encounter.  No orders of the defined types were placed in this encounter.   Signed,  Maud Deed. Sandro Burgo, MD   Outpatient Encounter Medications as of 07/21/2019  Medication Sig  . acetaminophen (TYLENOL) 325 MG tablet Take 325-650 mg by mouth daily as needed for moderate pain or headache.   Marland Kitchen aspirin EC 81 MG EC tablet Take 1 tablet (81 mg total) by mouth daily.  Marland Kitchen atorvastatin (LIPITOR) 80 MG tablet Take 1 tablet (80 mg total) by mouth daily at 6 PM.  . carvedilol (COREG) 12.5 MG tablet Take 1 tablet (12.5 mg total) by mouth 2 (two) times daily.  . clopidogrel (PLAVIX) 75 MG tablet Take 1 tablet (75 mg total) by mouth daily with breakfast.  . CORLANOR 5 MG TABS tablet TAKE ONE TABLET TWICE DAILY WITH MEALS  . ENTRESTO 49-51 MG TAKE ONE TABLET TWICE DAILY STARTING SUNDAY 09/15/18  . FLUoxetine (PROZAC) 20 MG capsule Take 1 capsule (20 mg total) by mouth daily.  Marland Kitchen glucose blood (ONETOUCH ULTRA) test strip Use to check blood sugar up to 8 times a day.  . insulin regular human CONCENTRATED (HUMULIN R U-500 KWIKPEN) 500 UNIT/ML kwikpen Inject 50-100 Units into the skin 4 (four) times daily -  before meals and at bedtime. Give 0 units insulin for blood sugar < 200  Give 20 units insulin for blood sugar 201 - 300 Give 50 units insulin for blood sugar 301 - 400 Give 90 units insulin for blood sugar 401 - 500 Give 100 units insulin for blood sugar > 500 .  Marland Kitchen Insulin Syringe-Needle U-100 (B-D INS SYRINGE 0.5CC/31GX5/16) 31G X 5/16" 0.5 ML MISC USE AS DIRECTED THREE TIMES A DAY  . spironolactone (ALDACTONE) 25 MG tablet Take 1 tablet (25 mg total) by mouth daily.   . [EXPIRED] traMADol (ULTRAM) 50 MG tablet Take 1 tablet (50 mg total) by mouth every 8 (eight) hours as needed for up to 7 days for moderate pain or severe pain.   No facility-administered encounter medications on file as of 07/21/2019.

## 2019-07-24 ENCOUNTER — Other Ambulatory Visit: Payer: Self-pay | Admitting: *Deleted

## 2019-07-24 NOTE — Patient Outreach (Signed)
Outreach call to pt for screening/  3rd attempt, no answer to telephone and no option to leave voicemail.  PLAN Close case in 3-4 business days if no response from pt  Jacqlyn Larsen [facility], [place] Coordinator [phone]

## 2019-07-28 ENCOUNTER — Telehealth: Payer: Self-pay

## 2019-07-28 NOTE — Telephone Encounter (Signed)
Patient left a message on triage line stating she wanted to ask about getting in home care person. She said something else at the end but the connection was breaking up and I could not understand.  Called patient and left message to get further information

## 2019-07-29 ENCOUNTER — Telehealth: Payer: Self-pay | Admitting: *Deleted

## 2019-07-29 ENCOUNTER — Other Ambulatory Visit: Payer: Self-pay | Admitting: *Deleted

## 2019-07-29 NOTE — Telephone Encounter (Signed)
Left v/m for pt to cb to [facility].

## 2019-07-29 NOTE — Telephone Encounter (Signed)
Received a call from [place] RN, she has been trying  to reach the patient and has been unsuccessful. She will try again to reach her by phone for [facility].

## 2019-07-29 NOTE — Telephone Encounter (Signed)
Pt left VM at Triage asking the status of her in home care referral, will route to [facility] and Jacqlyn Larsen, RN [facility]) that tried to reach out to pt regarding referral

## 2019-07-29 NOTE — Patient Outreach (Signed)
Unable to reach pt, case closed, note sent to primary MD informing of case closure.  Jacqlyn Larsen [facility], [place] Coordinator [phone]

## 2019-07-29 NOTE — Telephone Encounter (Signed)
No answer; left v/m requesting cb.

## 2019-07-29 NOTE — Telephone Encounter (Signed)
[facility] Night - Client TELEPHONE ADVICE RECORD [facility] Patient Name: Kristy [name] Gender: Female DOB: 01/12/1951 Age: 68 Y 63 M 21 D Return Phone Number: [phone] (Primary) Address: City/State/Zip: [place] [address] Client [facility] Night - Client Client Site [place] Physician Owens Loffler - MD Contact Type Call Who Is Calling Patient / Member / Family / Caregiver Call Type Triage / Clinical Relationship To Patient Self Return Phone Number [phone] (Primary) Chief Complaint Walking difficulty Reason for Call Symptomatic / Request for Health Information Initial Comment Caller states for the last 3 wks has been in and out of the hospital and things have gotten worse. Requesting help in the home. Not able to stand up, fallen 20 times this summer. Today fell twice. Translation No Nurse Assessment Nurse: Tawanna Solo, RN, Vaughan Basta Date/Time (Eastern Time): 07/28/2019 5:15:14 PM Confirm and document reason for call. If symptomatic, describe symptoms. ---Caller says she is unable to stand up. She has fallen twice today. Caller is having pain in her left knee. Says she can barely walk. Caller says she is requesting help in her home. Caller says she needs help with medicines, bills and other things in her home. Has the patient had close contact with a person known or suspected to have the novel coronavirus illness OR traveled / lives in area with major community spread (including international travel) in the last 14 days from the onset of symptoms? * If Asymptomatic, screen for exposure and travel within the last 14 days. ---No Does the patient have any new or worsening symptoms? ---No Guidelines Guideline Title Affirmed Question Affirmed Notes Nurse Date/Time (Eastern Time) Disp. Time Eilene Ghazi Time) Disposition Final User 07/28/2019 5:22:11 PM Clinical Call Yes Tawanna Solo,  RN, Vaughan Basta Comments User: Prescilla Sours, RN Date/Time Eilene Ghazi Time): 07/28/2019 5:21:49 PM Nurse advised patient to call back tomorrow and discuss her needs with her physicians nurse.

## 2019-07-30 NOTE — Telephone Encounter (Signed)
great

## 2019-07-30 NOTE — Telephone Encounter (Signed)
Jancie notified as instructed by telephone.  She states she has found someone that is going to come out and help her 3 days a week and more if she needs it.  Nothing further is needed from [place].

## 2019-07-30 NOTE — Telephone Encounter (Signed)
Please call  I can get home health RN, PT, OT to come to her house and help her, but there is no way to get daily in-home care.   This is a cash-pay situation unfortunately with no insurance support. (not HH)

## 2019-07-31 ENCOUNTER — Other Ambulatory Visit: Payer: Self-pay | Admitting: *Deleted

## 2019-07-31 ENCOUNTER — Encounter: Payer: Self-pay | Admitting: *Deleted

## 2019-07-31 NOTE — Patient Outreach (Signed)
Referral originally received from hospital liason and RN CM was unable to contact pt,  Pt did receive case closure letter in mail and called her primary care MD inquiring about [facility] program, MD office contacted RN CM and ask that pt be outreached again.  Pt with diagnoses DM type 2 on insulin, DKA, UTI, CHF, depression, CKD stage 3, HTN, gout/ arthritis.  Outreach call to pt for screening, Primary care provider completes transition of care. Spoke with pt, HIPAA verified, pt reports she lives alone, has no children, has niece that is HCPOA and checks in on her and provides transportation, pt states she has in home care with [facility] starting next Monday 3 days week, 4 hours daily.  Pt states she is able to afford out of pocket and has no financial concerns.  Pt states her blood sugar has been out of control for years and " the hospital can't even control it when I'm there"  Pt states she has new endocrinologist at [facility] and due to Covid has not returned since March but will follow up in November, pt checks CBG 8-10 x daily and will be discussing Freestyle LIbre.  Pt states CBG ranges 130-600 and " it's all over the place everyday "  Pt states she eats eggs only for breakfast and CBG still rises, states " I'm very careful what I eat"  Pt states no home health ordered because she does not feel PT will be beneficial for her right now, pt states she did discuss with MD.  Pt has been to diabetes nutrition classes and has educational material in the home, pt feels HF is under good control, feels diabetes and falls are major concerns, states " no one has been able to really fix my diabetes"  Pt is able to still drive if she has to, has support of niece and will have in home support starting next week.  RN CM faxed barrier letter and today's note to primary care MD Dr. Lorelei Pont.  Outpatient Encounter Medications as of 07/31/2019  Medication Sig  . acetaminophen (TYLENOL) 325 MG tablet Take 325-650 mg by mouth daily  as needed for moderate pain or headache.   Marland Kitchen aspirin EC 81 MG EC tablet Take 1 tablet (81 mg total) by mouth daily.  . carvedilol (COREG) 12.5 MG tablet Take 1 tablet (12.5 mg total) by mouth 2 (two) times daily.  . clopidogrel (PLAVIX) 75 MG tablet Take 1 tablet (75 mg total) by mouth daily with breakfast.  . CORLANOR 5 MG TABS tablet TAKE ONE TABLET TWICE DAILY WITH MEALS  . ENTRESTO 49-51 MG TAKE ONE TABLET TWICE DAILY STARTING SUNDAY 09/15/18  . FLUoxetine (PROZAC) 20 MG capsule Take 1 capsule (20 mg total) by mouth daily.  Marland Kitchen glucose blood (ONETOUCH ULTRA) test strip Use to check blood sugar up to 8 times a day.  . insulin regular human CONCENTRATED (HUMULIN R U-500 KWIKPEN) 500 UNIT/ML kwikpen Inject 50-100 Units into the skin 4 (four) times daily -  before meals and at bedtime. Give 0 units insulin for blood sugar < 200  Give 20 units insulin for blood sugar 201 - 300 Give 50 units insulin for blood sugar 301 - 400 Give 90 units insulin for blood sugar 401 - 500 Give 100 units insulin for blood sugar > 500 .  Marland Kitchen Insulin Syringe-Needle U-100 (B-D INS SYRINGE 0.5CC/31GX5/16) 31G X 5/16" 0.5 ML MISC USE AS DIRECTED THREE TIMES A DAY  . spironolactone (ALDACTONE) 25 MG tablet  Take 1 tablet (25 mg total) by mouth daily.  Marland Kitchen atorvastatin (LIPITOR) 80 MG tablet Take 1 tablet (80 mg total) by mouth daily at 6 PM. (Patient not taking: Reported on 07/31/2019)   No facility-administered encounter medications on file as of 07/31/2019.     THN CM Care Plan Problem One     Most Recent Value  Care Plan Problem One  Knowledge deficit related to diabetes  Role Documenting the Problem One  Care Management [place] for Problem One  Active  THN Long Term Goal   Pt will demonstrate improved self care related to diabetes aeb decrease in Hgb AIC 1-2 points in 60 days  THN Long Term Goal Start Date  07/31/19  Interventions for Problem One Long Term Goal  RN CM explained [facility] program with pt,  established plan of care, reviewed medications, placed order for [facility] pharmacist related to [facility] of 14, mailed successful outreach letter to pt home including consent form, 24 hour nurse line magnet, pamphlet  THN CM Short Term Goal #1   Pt will verbalize/ utilize plate method wtihin 30 days  THN CM Short Term Goal #1 Start Date  07/31/19  Interventions for Short Term Goal #1  RN CM explained plate method and reviewed healthy food choices  THN CM Short Term Goal #2   Pt will attend endocrinologist appointment at [facility] on 08/20/19 and discuss freestyle Libre within 30 days  THN CM Short Term Goal #2 Start Date  07/31/19  Interventions for Short Term Goal #2  Pt checks CBG 8-10 x day,  RN CM reviewed what Freestyle Elenor Legato is for continuous CBG monitoring, pt states she is aware and had planned to speak wtih MD about this and any other issues with CBG    [facility] CM Care Plan Problem Two     Most Recent Value  Care Plan Problem Two  Pt high risk for falls  Role Documenting the Problem Two  Care Management [place] for Problem Two  Active  THN CM Short Term Goal #1   Pt will verbalize/ utilize safety precautions within 30 days  THN CM Short Term Goal #1 Start Date  07/31/19  Interventions for Short Term Goal #2   RN CM reviewed safety precautions with pt, importance of using walker at all times, asking for assistance as needed, keeping her telephone nearby      [employer] pt for telephone assessment next month  Jacqlyn Larsen [facility], [place] Coordinator [phone]

## 2019-08-04 ENCOUNTER — Telehealth: Payer: Self-pay

## 2019-08-04 ENCOUNTER — Inpatient Hospital Stay
Admission: EM | Admit: 2019-08-04 | Discharge: 2019-08-15 | DRG: 871 | Disposition: A | Discharge status: Home Health | Payer: Medicare HMO | Attending: Internal Medicine | Admitting: Internal Medicine

## 2019-08-04 ENCOUNTER — Other Ambulatory Visit: Payer: Self-pay | Admitting: Pharmacist

## 2019-08-04 ENCOUNTER — Encounter: Payer: Self-pay | Admitting: *Deleted

## 2019-08-04 ENCOUNTER — Other Ambulatory Visit: Payer: Self-pay

## 2019-08-04 ENCOUNTER — Emergency Department: Payer: Medicare HMO

## 2019-08-04 DIAGNOSIS — Z90710 Acquired absence of both cervix and uterus: Secondary | ICD-10-CM

## 2019-08-04 DIAGNOSIS — E785 Hyperlipidemia, unspecified: Secondary | ICD-10-CM | POA: Diagnosis present

## 2019-08-04 DIAGNOSIS — Z87891 Personal history of nicotine dependence: Secondary | ICD-10-CM

## 2019-08-04 DIAGNOSIS — E860 Dehydration: Secondary | ICD-10-CM | POA: Diagnosis present

## 2019-08-04 DIAGNOSIS — Z9114 Patient's other noncompliance with medication regimen: Secondary | ICD-10-CM | POA: Diagnosis not present

## 2019-08-04 DIAGNOSIS — K920 Hematemesis: Secondary | ICD-10-CM | POA: Diagnosis present

## 2019-08-04 DIAGNOSIS — R6521 Severe sepsis with septic shock: Secondary | ICD-10-CM | POA: Diagnosis not present

## 2019-08-04 DIAGNOSIS — I447 Left bundle-branch block, unspecified: Secondary | ICD-10-CM | POA: Diagnosis not present

## 2019-08-04 DIAGNOSIS — E1165 Type 2 diabetes mellitus with hyperglycemia: Secondary | ICD-10-CM | POA: Diagnosis not present

## 2019-08-04 DIAGNOSIS — R404 Transient alteration of awareness: Secondary | ICD-10-CM | POA: Diagnosis not present

## 2019-08-04 DIAGNOSIS — I959 Hypotension, unspecified: Secondary | ICD-10-CM

## 2019-08-04 DIAGNOSIS — Z20828 Contact with and (suspected) exposure to other viral communicable diseases: Secondary | ICD-10-CM | POA: Diagnosis not present

## 2019-08-04 DIAGNOSIS — Z794 Long term (current) use of insulin: Secondary | ICD-10-CM

## 2019-08-04 DIAGNOSIS — E0810 Diabetes mellitus due to underlying condition with ketoacidosis without coma: Secondary | ICD-10-CM

## 2019-08-04 DIAGNOSIS — Z0189 Encounter for other specified special examinations: Secondary | ICD-10-CM

## 2019-08-04 DIAGNOSIS — I428 Other cardiomyopathies: Secondary | ICD-10-CM | POA: Diagnosis present

## 2019-08-04 DIAGNOSIS — R4182 Altered mental status, unspecified: Secondary | ICD-10-CM | POA: Diagnosis not present

## 2019-08-04 DIAGNOSIS — R58 Hemorrhage, not elsewhere classified: Secondary | ICD-10-CM | POA: Diagnosis not present

## 2019-08-04 DIAGNOSIS — E1151 Type 2 diabetes mellitus with diabetic peripheral angiopathy without gangrene: Secondary | ICD-10-CM | POA: Diagnosis present

## 2019-08-04 DIAGNOSIS — N1832 Chronic kidney disease, stage 3b: Secondary | ICD-10-CM | POA: Diagnosis present

## 2019-08-04 DIAGNOSIS — A419 Sepsis, unspecified organism: Principal | ICD-10-CM | POA: Diagnosis present

## 2019-08-04 DIAGNOSIS — I5022 Chronic systolic (congestive) heart failure: Secondary | ICD-10-CM | POA: Diagnosis present

## 2019-08-04 DIAGNOSIS — Z7982 Long term (current) use of aspirin: Secondary | ICD-10-CM | POA: Diagnosis not present

## 2019-08-04 DIAGNOSIS — N179 Acute kidney failure, unspecified: Secondary | ICD-10-CM | POA: Diagnosis not present

## 2019-08-04 DIAGNOSIS — E1122 Type 2 diabetes mellitus with diabetic chronic kidney disease: Secondary | ICD-10-CM | POA: Diagnosis present

## 2019-08-04 DIAGNOSIS — R531 Weakness: Secondary | ICD-10-CM | POA: Diagnosis not present

## 2019-08-04 DIAGNOSIS — E1110 Type 2 diabetes mellitus with ketoacidosis without coma: Secondary | ICD-10-CM | POA: Diagnosis present

## 2019-08-04 DIAGNOSIS — R109 Unspecified abdominal pain: Secondary | ICD-10-CM | POA: Diagnosis not present

## 2019-08-04 DIAGNOSIS — Z7902 Long term (current) use of antithrombotics/antiplatelets: Secondary | ICD-10-CM

## 2019-08-04 DIAGNOSIS — D696 Thrombocytopenia, unspecified: Secondary | ICD-10-CM | POA: Diagnosis present

## 2019-08-04 DIAGNOSIS — R4020 Unspecified coma: Secondary | ICD-10-CM | POA: Diagnosis not present

## 2019-08-04 DIAGNOSIS — E878 Other disorders of electrolyte and fluid balance, not elsewhere classified: Secondary | ICD-10-CM | POA: Diagnosis present

## 2019-08-04 DIAGNOSIS — W51XXXA Accidental striking against or bumped into by another person, initial encounter: Secondary | ICD-10-CM | POA: Diagnosis not present

## 2019-08-04 DIAGNOSIS — Z9119 Patient's noncompliance with other medical treatment and regimen: Secondary | ICD-10-CM

## 2019-08-04 DIAGNOSIS — E11649 Type 2 diabetes mellitus with hypoglycemia without coma: Secondary | ICD-10-CM | POA: Diagnosis not present

## 2019-08-04 DIAGNOSIS — R6520 Severe sepsis without septic shock: Secondary | ICD-10-CM | POA: Diagnosis not present

## 2019-08-04 DIAGNOSIS — Z79899 Other long term (current) drug therapy: Secondary | ICD-10-CM | POA: Diagnosis not present

## 2019-08-04 DIAGNOSIS — Z0279 Encounter for issue of other medical certificate: Secondary | ICD-10-CM

## 2019-08-04 DIAGNOSIS — E870 Hyperosmolality and hypernatremia: Secondary | ICD-10-CM

## 2019-08-04 DIAGNOSIS — F329 Major depressive disorder, single episode, unspecified: Secondary | ICD-10-CM | POA: Diagnosis present

## 2019-08-04 DIAGNOSIS — K922 Gastrointestinal hemorrhage, unspecified: Secondary | ICD-10-CM

## 2019-08-04 DIAGNOSIS — G9341 Metabolic encephalopathy: Secondary | ICD-10-CM | POA: Diagnosis present

## 2019-08-04 DIAGNOSIS — R079 Chest pain, unspecified: Secondary | ICD-10-CM | POA: Diagnosis not present

## 2019-08-04 DIAGNOSIS — R000 Tachycardia, unspecified: Secondary | ICD-10-CM | POA: Diagnosis not present

## 2019-08-04 DIAGNOSIS — I130 Hypertensive heart and chronic kidney disease with heart failure and stage 1 through stage 4 chronic kidney disease, or unspecified chronic kidney disease: Secondary | ICD-10-CM | POA: Diagnosis not present

## 2019-08-04 DIAGNOSIS — E876 Hypokalemia: Secondary | ICD-10-CM | POA: Diagnosis present

## 2019-08-04 DIAGNOSIS — S40029A Contusion of unspecified upper arm, initial encounter: Secondary | ICD-10-CM | POA: Diagnosis present

## 2019-08-04 DIAGNOSIS — N390 Urinary tract infection, site not specified: Secondary | ICD-10-CM | POA: Diagnosis present

## 2019-08-04 DIAGNOSIS — Z23 Encounter for immunization: Secondary | ICD-10-CM

## 2019-08-04 DIAGNOSIS — I2510 Atherosclerotic heart disease of native coronary artery without angina pectoris: Secondary | ICD-10-CM | POA: Diagnosis present

## 2019-08-04 DIAGNOSIS — K219 Gastro-esophageal reflux disease without esophagitis: Secondary | ICD-10-CM | POA: Diagnosis present

## 2019-08-04 DIAGNOSIS — R0902 Hypoxemia: Secondary | ICD-10-CM | POA: Diagnosis not present

## 2019-08-04 LAB — GLUCOSE, CAPILLARY
Glucose-Capillary: >600 mg/dL (ref 70–99)
Glucose-Capillary: >600 mg/dL (ref 70–99)

## 2019-08-04 LAB — CBC
HCT: 53.7 % — ABNORMAL HIGH (ref 36.0–46.0)
Hemoglobin: 15.8 g/dL — ABNORMAL HIGH (ref 12.0–15.0)
MCH: 28.8 pg (ref 26.0–34.0)
MCHC: 29.4 g/dL — ABNORMAL LOW (ref 30.0–36.0)
MCV: 97.8 fL (ref 80.0–100.0)
Platelets: 364 10*3/uL (ref 150–400)
RBC: 5.49 MIL/uL — ABNORMAL HIGH (ref 3.87–5.11)
RDW: 13.7 % (ref 11.5–15.5)
WBC: 28.9 10*3/uL — ABNORMAL HIGH (ref 4.0–10.5)
nRBC: 0 % (ref 0.0–0.2)

## 2019-08-04 LAB — COMPREHENSIVE METABOLIC PANEL
ALT: 19 U/L (ref 0–44)
AST: 22 U/L (ref 15–41)
Albumin: 3.8 g/dL (ref 3.5–5.0)
Alkaline Phosphatase: 151 U/L — ABNORMAL HIGH (ref 38–126)
Anion gap: 26 — ABNORMAL HIGH (ref 5–15)
BUN: 72 mg/dL — ABNORMAL HIGH (ref 8–23)
CO2: 21 mmol/L — ABNORMAL LOW (ref 22–32)
Calcium: 10.8 mg/dL — ABNORMAL HIGH (ref 8.9–10.3)
Chloride: 90 mmol/L — ABNORMAL LOW (ref 98–111)
Creatinine, Ser: 2.65 mg/dL — ABNORMAL HIGH (ref 0.44–1.00)
GFR calc Af Amer: 21 mL/min — ABNORMAL LOW (ref >60)
GFR calc non Af Amer: 18 mL/min — ABNORMAL LOW (ref >60)
Glucose, Bld: 1324 mg/dL (ref 70–99)
Potassium: 5.1 mmol/L (ref 3.5–5.1)
Sodium: 137 mmol/L (ref 135–145)
Total Bilirubin: 1.1 mg/dL (ref 0.3–1.2)
Total Protein: 8.7 g/dL — ABNORMAL HIGH (ref 6.5–8.1)

## 2019-08-04 LAB — URINALYSIS, COMPLETE (UACMP) WITH MICROSCOPIC
Bilirubin Urine: NEGATIVE
Glucose, UA: >=500 mg/dL — AB
Ketones, ur: 5 mg/dL — AB
Nitrite: NEGATIVE
Protein, ur: 30 mg/dL — AB
Specific Gravity, Urine: 1.021 (ref 1.005–1.030)
Squamous Epithelial / HPF: NONE SEEN (ref 0–5)
WBC, UA: >50 WBC/hpf — ABNORMAL HIGH (ref 0–5)
pH: 6 (ref 5.0–8.0)

## 2019-08-04 LAB — PROTIME-INR
INR: 1.3 — ABNORMAL HIGH (ref 0.8–1.2)
Prothrombin Time: 15.7 seconds — ABNORMAL HIGH (ref 11.4–15.2)

## 2019-08-04 LAB — APTT: aPTT: 29 seconds (ref 24–36)

## 2019-08-04 LAB — SARS CORONAVIRUS 2 BY RT PCR (HOSPITAL ORDER, PERFORMED IN ~~LOC~~ HOSPITAL LAB): SARS Coronavirus 2: NEGATIVE

## 2019-08-04 LAB — ABO/RH: ABO/RH(D): O NEG

## 2019-08-04 MED ORDER — SODIUM CHLORIDE 0.9 % IV SOLN
INTRAVENOUS | Status: DC
  Administered 2019-08-05: 04:00:00 via INTRAVENOUS

## 2019-08-04 MED ORDER — SODIUM CHLORIDE 0.9 % IV SOLN
80.0000 mg | Freq: Once | INTRAVENOUS | Status: AC
  Administered 2019-08-05: 80 mg via INTRAVENOUS
  Filled 2019-08-04: qty 80

## 2019-08-04 MED ORDER — IVABRADINE HCL 5 MG PO TABS
5.0000 mg | ORAL_TABLET | Freq: Two times a day (BID) | ORAL | Status: DC
  Administered 2019-08-06 – 2019-08-15 (×18): 5 mg via ORAL
  Filled 2019-08-04 (×22): qty 1

## 2019-08-04 MED ORDER — VANCOMYCIN HCL IN DEXTROSE 1-5 GM/200ML-% IV SOLN: 1000.0000 mg | Freq: Once | INTRAVENOUS | Status: DC

## 2019-08-04 MED ORDER — SODIUM CHLORIDE 0.9 % IV SOLN: 10.0000 mL/h | Freq: Once | INTRAVENOUS | Status: DC

## 2019-08-04 MED ORDER — SODIUM CHLORIDE 0.9 % IV SOLN: INTRAVENOUS | Status: AC

## 2019-08-04 MED ORDER — DEXTROSE-NACL 5-0.45 % IV SOLN: INTRAVENOUS | Status: DC

## 2019-08-04 MED ORDER — SODIUM CHLORIDE 0.9 % IV BOLUS
1000.0000 mL | Freq: Once | INTRAVENOUS | Status: AC
  Administered 2019-08-05: 1000 mL via INTRAVENOUS

## 2019-08-04 MED ORDER — PANTOPRAZOLE SODIUM 40 MG IV SOLR
40.0000 mg | Freq: Two times a day (BID) | INTRAVENOUS | Status: DC
  Administered 2019-08-08 – 2019-08-10 (×6): 40 mg via INTRAVENOUS
  Filled 2019-08-04 (×6): qty 40

## 2019-08-04 MED ORDER — SPIRONOLACTONE 25 MG PO TABS: 25.0000 mg | ORAL_TABLET | Freq: Every day | ORAL | 9 refills | Status: DC

## 2019-08-04 MED ORDER — SODIUM CHLORIDE 0.9 % IV SOLN
8.0000 mg/h | INTRAVENOUS | Status: DC
  Administered 2019-08-05 – 2019-08-06 (×4): 8 mg/h via INTRAVENOUS
  Filled 2019-08-04 (×5): qty 80

## 2019-08-04 MED ORDER — INSULIN REGULAR(HUMAN) IN NACL 100-0.9 UT/100ML-% IV SOLN
INTRAVENOUS | Status: DC
  Administered 2019-08-04: 5.4 [IU]/h via INTRAVENOUS
  Filled 2019-08-04: qty 100

## 2019-08-04 MED ORDER — SODIUM CHLORIDE 0.9 % IV SOLN: 1.0000 g | Freq: Once | INTRAVENOUS | Status: DC

## 2019-08-04 MED ORDER — SODIUM CHLORIDE 0.9 % IV SOLN
2.0000 g | INTRAVENOUS | Status: DC
  Administered 2019-08-05: 2 g via INTRAVENOUS
  Filled 2019-08-04 (×2): qty 2

## 2019-08-04 MED ORDER — DEXTROSE-NACL 5-0.45 % IV SOLN
INTRAVENOUS | Status: DC
  Administered 2019-08-05: 09:00:00 via INTRAVENOUS

## 2019-08-04 MED ORDER — FLUOXETINE HCL 20 MG PO CAPS
20.0000 mg | ORAL_CAPSULE | Freq: Every day | ORAL | Status: DC
  Administered 2019-08-07 – 2019-08-15 (×9): 20 mg via ORAL
  Filled 2019-08-04 (×11): qty 1

## 2019-08-04 MED ORDER — SODIUM CHLORIDE 0.9 % IV SOLN: 2.0000 g | Freq: Once | INTRAVENOUS | Status: DC

## 2019-08-04 MED ORDER — INSULIN REGULAR(HUMAN) IN NACL 100-0.9 UT/100ML-% IV SOLN
INTRAVENOUS | Status: DC
  Administered 2019-08-05: 21.6 [IU]/h via INTRAVENOUS
  Administered 2019-08-05: 16.2 [IU]/h via INTRAVENOUS
  Administered 2019-08-05: 10.8 [IU]/h via INTRAVENOUS
  Administered 2019-08-05: 7.1 [IU]/h via INTRAVENOUS
  Filled 2019-08-04: qty 100

## 2019-08-04 NOTE — ED Notes (Signed)
Designer, multimedia (niece) POA, gave verbal consent via phone for blood transfusion to this nurse.

## 2019-08-04 NOTE — Telephone Encounter (Signed)
Requested Prescriptions   Signed Prescriptions Disp Refills  . spironolactone (ALDACTONE) 25 MG tablet 30 tablet 9    Sig: Take 1 tablet (25 mg total) by mouth daily.    Authorizing Provider: Minna Merritts    Ordering User: Raelene Bott, Vincie Linn L

## 2019-08-04 NOTE — ED Notes (Signed)
Pt uncooperative while attempting to place foley catheter. Requiring two RNs and two techs to place. During pt kept trying to cross legs and stiffen up while this NT was trying to remain sterile.

## 2019-08-04 NOTE — Patient Outreach (Signed)
[facility]) Care Management  [place]   08/04/2019  TIFFANEE MCNEE [birth year] [id number]  Reason for referral: Medication Management  Referral source: [facility] RN Current insurance: [employer]  PMHx includes but not limited to:  Uncontrolled T2DM with neuropathy, hx DKA, CKD-III, HTN, HLD, NICM (EF 50-55% 3/'20), CHF, CAD, LBBB, gout, arthritis, Crohn's disease, GERD, MDD, former smoker (22.5 pack year history), baker's cyst left knee, severe gait instability leading to falls, recent hospitalization 10/6-10/12 for UTI.    Per notes, patient has seen several endocrinologists in the past including [name], Dr. Gabriel Carina, and Dr. Rutherford Nail from [facility] with gaps in f/u care.  Currently taking U-500 regular SSI.       Per notes from endocrinology on 12/02/18, patient was provided CGM sensor trial, MD also had plans to change to Humulin U-500 pen rather than vial.  Patient did not f/u with provider after appt.    Patient has appt with a new endocrinologist, Dr. Reggie Pile, from [place] scheduled on 08/20/2019.  Patient stated she will inquire about CGM with new endocrinologist in Nov.   -Uses [facility] in [place], recent PA for test strips approved for patient testing CBGs up to 8x daily.  -May have been approved by [employer] for patient assistance program in the past.  -A1C improved 15.5--> 10.9 in the last 3 months.  -Has reported to providers that "none of her diabetes medications work."    Outreach:  Unsuccessful telephone call attempt #1 to patient. HIPAA compliant voicemail left requesting a return call  Plan:  -I will mail patient an unsuccessful outreach letter.  -I will make another outreach attempt to patient within 3-4 business days.    Ralene Bathe, PharmD, [place] [phone]

## 2019-08-04 NOTE — ED Triage Notes (Signed)
Pt brought in via ems from home with bloody emesis.  Ems report pt was lying in bed with blood emesis today.  On arrival to er, iv in place. Bloody emesis around mouth.  Sinus tach on monitor.

## 2019-08-04 NOTE — ED Notes (Signed)
md in with pt for admissiion.

## 2019-08-04 NOTE — ED Notes (Signed)
No reaction noted.  Sinus tach on monitor.

## 2019-08-04 NOTE — ED Provider Notes (Signed)
[facility] Emergency Department Provider Note  ____________________________________________   I have reviewed the triage vital signs and the nursing notes.   HISTORY  Chief Complaint Hematemesis   History limited by and level 5 caveat due to: AMS   HPI Kristy [name] is a 68 y.o. female who presents to the emergency department today via EMS because of concerns for bloody emesis.  Patient herself cannot give any significant history.  Over the phone family states that the patient has been noncompliant with medications.  She has not been doing well for the past couple of days.  Does have history of DKA.   Records reviewed. Per medical record review patient has a history of poorly controlled diabetes. DKA.   Past Medical History:  Diagnosis Date  . Allergic rhinitis   . Allergy   . Cataract    mild   . Crohn's disease ([place]) 10/13/2013  . Diverticulosis   . GERD (gastroesophageal reflux disease)   . Gout   . HFrEF (heart failure with reduced ejection fraction) ([place])    a. 12/2008 Cath: EF 45% w/ inf HK; b. 07/2009 Echo: EF 50-55%; c. 08/2018 Echo: EF 20-25%.  Marland Kitchen Hyperlipidemia   . Hypertension   . IBS (irritable bowel syndrome)   . Left bundle branch block   . Neuromuscular disorder (HCC)    neuropathy  . NICM (nonischemic cardiomyopathy) ([place])    a. 12/2008 Cath: no significant dzs, EF 45% w/ inf HK->Med Rx; b. 07/2009 Echo: EF 50-55%; c. 08/2018 Echo: EF 20-25%, ant/antsept HK, mild MR, mildly dil LA, nl RV fx; d. 08/2018 Cath: D1 80, otw nonobs dzs->Med rx.  . Non-obstructive CAD (coronary artery disease)    a. 12/2008 Cath: no significant dzs, EF 45% w/ inf HK->Med Rx; b. 08/2018 Cath: LM nl, LAD min irregs, D1 80, LCX 58md, RCA min irregs->Med Rx.  . Osteoarthritis   . Poorly controlled Diabetes mellitus    a. 07/2018 A1c 13.1.  .Marland KitchenSymptomatic cholelithiasis   . Uncontrolled type 2 diabetes mellitus with stage 3 chronic kidney disease, with  long-term current use of insulin ([facility]          Patient Active Problem List   Diagnosis Date Noted  . DKA (diabetic ketoacidoses) ([place] 04/19/2019  . NICM (nonischemic cardiomyopathy) ([place] 11/08/2018  . Acute on chronic HFrEF (heart failure with reduced ejection fraction) ([place] 08/22/2018  . Coronary artery disease, non-occlusive 09/13/2016  . Primary osteoarthritis involving multiple joints 03/14/2015  . Personal history of other malignant neoplasm of skin 12/01/2014  . Crohn's disease ([place] 10/13/2013  . Major depressive disorder, recurrent, in full remission ([place] 05/01/2011  . TRANSAMINASES, SERUM, ELEVATED 05/01/2010  . PSORIASIS 08/02/2009  . Hyperlipidemia 05/11/2009  . GOUT 02/05/2009  . Uncontrolled type 2 diabetes mellitus with stage 3 chronic kidney disease, with long-term current use of insulin ([place]   . Essential hypertension 04/18/2007  . LBBB (left bundle branch block) 04/18/2007  . ALLERGIC RHINITIS 04/18/2007    Past Surgical History:  Procedure Laterality Date  . BREAST BIOPSY Right 06/24/2019   stereo bx/ x clip/ path pending  . BREAST CYST ASPIRATION    . COLONOSCOPY    . DILATION AND CURETTAGE OF UTERUS    . LEFT HEART CATH AND CORONARY ANGIOGRAPHY N/A 04/24/2019   Procedure: LEFT HEART CATH AND CORONARY ANGIOGRAPHY;  Surgeon: ENelva Bush MD;  [facility];  Service: Cardiovascular;  Laterality: N/A;  . RIGHT/LEFT HEART CATH AND CORONARY ANGIOGRAPHY  N/A 08/26/2018   Procedure: RIGHT/LEFT HEART CATH AND CORONARY ANGIOGRAPHY;  Surgeon: Wellington Hampshire, MD;  Location: [place] [facility];  Service: Cardiovascular;  Laterality: N/A;  . SHOULDER SURGERY     15 + yrs ago   . SKIN SURGERY     nose   . VAGINAL HYSTERECTOMY      Prior to Admission medications   Medication Sig Start Date End Date Taking? Authorizing Provider  acetaminophen (TYLENOL) 325 MG tablet Take 325-650 mg by mouth daily as needed for moderate pain or headache.      [provider]  aspirin EC 81 MG EC tablet Take 1 tablet (81 mg total) by mouth daily. 04/29/19   Hillary Bow, MD  atorvastatin (LIPITOR) 80 MG tablet Take 1 tablet (80 mg total) by mouth daily at 6 PM. Patient not taking: Reported on 07/31/2019 05/19/19   Minna Merritts, MD  carvedilol (COREG) 12.5 MG tablet Take 1 tablet (12.5 mg total) by mouth 2 (two) times daily. 06/11/19 09/09/19  Minna Merritts, MD  clopidogrel (PLAVIX) 75 MG tablet Take 1 tablet (75 mg total) by mouth daily with breakfast. 06/11/19   Rockey Situ, Kathlene November, MD  CORLANOR 5 MG TABS tablet TAKE ONE TABLET TWICE DAILY WITH MEALS 05/12/19   Theora Gianotti, NP  ENTRESTO 49-51 MG TAKE ONE TABLET TWICE DAILY STARTING SUNDAY 09/15/18 05/05/19   Minna Merritts, MD  FLUoxetine (PROZAC) 20 MG capsule Take 1 capsule (20 mg total) by mouth daily. 02/12/19   Copland, [name], MD  glucose blood (ONETOUCH ULTRA) test strip Use to check blood sugar up to 8 times a day. 06/25/19   Copland, [name], MD  insulin regular human CONCENTRATED (HUMULIN R U-500 KWIKPEN) 500 UNIT/ML kwikpen Inject 50-100 Units into the skin 4 (four) times daily -  before meals and at bedtime. Give 0 units insulin for blood sugar < 200  Give 20 units insulin for blood sugar 201 - 300 Give 50 units insulin for blood sugar 301 - 400 Give 90 units insulin for blood sugar 401 - 500 Give 100 units insulin for blood sugar > 500 . 07/14/19 11/11/19  Gladstone Lighter, MD  Insulin Syringe-Needle U-100 (B-D INS SYRINGE 0.5CC/31GX5/16) 31G X 5/16" 0.5 ML MISC USE AS DIRECTED THREE TIMES A DAY 08/06/17   Copland, [name], MD  spironolactone (ALDACTONE) 25 MG tablet Take 1 tablet (25 mg total) by mouth daily. 08/04/19   Minna Merritts, MD    Allergies Fish oil, Glimepiride, Guanfacine hcl, and Rosiglitazone  Family History  Problem Relation Age of Onset  . Kidney failure Mother   . Hypertension Father   . Kidney failure Brother   . Colon cancer Neg Hx    . Colon polyps Neg Hx   . Rectal cancer Neg Hx   . Stomach cancer Neg Hx   . Breast cancer Neg Hx     Social History Social History   Tobacco Use  . Smoking status: Former Smoker    Packs/day: 0.75    Years: 30.00    Pack years: 22.50    Types: Cigarettes    Quit date: 06/06/1997    Years since quitting: 22.1  . Smokeless tobacco: Never Used  Substance Use Topics  . Alcohol use: No  . Drug use: No    Review of Systems Unable to obtain secondary to ams.  ____________________________________________   PHYSICAL EXAM:  VITAL SIGNS: ED Triage Vitals  Enc Vitals Group     BP  08/04/19 2059 (!) 86/39     Pulse Rate 08/04/19 2052 (!) 28     Resp 08/04/19 2052 20     Temp 08/04/19 2052 98.4 F (36.9 C)     Temp Source 08/04/19 2052 Oral     SpO2 --      Weight 08/04/19 2054 160 lb (72.6 kg)     Height 08/04/19 2054 5' 3"  (1.6 m)     Head Circumference --      Peak Flow --      Pain Score 08/04/19 2053 0   Constitutional: Awake, not oriented.  Eyes: Conjunctivae are normal.  ENT      Head: Normocephalic and atraumatic.      Nose: No congestion/rhinnorhea.      Mouth/Throat: Mucous membranes are moist.      Neck: No stridor. Hematological/Lymphatic/Immunilogical: No cervical lymphadenopathy. Cardiovascular: Tachycardic, regular rhythm.  No murmurs, rubs, or gallops.  Respiratory: Normal respiratory effort without tachypnea nor retractions. Breath sounds are clear and equal bilaterally. No wheezes/rales/rhonchi. Gastrointestinal: Soft and non tender. No rebound. No guarding.  Genitourinary: Deferred Musculoskeletal: Normal range of motion in all extremities. No lower extremity edema. Neurologic:  Awake and alert. Not oriented to events. Moving all extremities.  Skin:  Skin is warm, dry and intact. No rash noted. ____________________________________________    LABS (pertinent positives/negatives)  COVID neg CMP na 137, k 5.1, glu 1324, bun 72, cr 2.65 CBC wbc  28.9, hgb 15.8, plt 364 UA small, hgb dipstick, mod leuk, >50 wbc  ____________________________________________   EKG  I, Nance Pear, attending physician, personally viewed and interpreted this EKG  EKG Time: 2051 Rate: 131 Rhythm: sinus tachycardia Axis: normal Intervals: qtc 629 QRS: LBBB ST changes: no st elevation Impression: abnormal ekg  ____________________________________________    RADIOLOGY  CXR No active disease  ____________________________________________   PROCEDURES  Procedures  CRITICAL CARE Performed by: Nance Pear   Total critical care time: 45 minutes  Critical care time was exclusive of separately billable procedures and treating other patients.  Critical care was necessary to treat or prevent imminent or life-threatening deterioration.  Critical care was time spent personally by me on the following activities: development of treatment plan with patient and/or surrogate as well as nursing, discussions with consultants, evaluation of patient's response to treatment, examination of patient, obtaining history from patient or surrogate, ordering and performing treatments and interventions, ordering and review of laboratory studies, ordering and review of radiographic studies, pulse oximetry and re-evaluation of patient's condition.  ____________________________________________   INITIAL IMPRESSION / ASSESSMENT AND PLAN / ED COURSE  Pertinent labs & imaging results that were available during my care of the patient were reviewed by me and considered in my medical decision making (see chart for details).   Patient presented to the emergency department today because of concerns for hematemesis.  On his initial exam patient did have some coffee-ground emesis around her mouth.  Given that she was both tachycardic and hypotensive I did have concern for large GI bleed.  Because of this patient was given emergency release blood.  Patient's blood  work then started returning with initially extremely elevated white blood cell count.  This would raise suspicion for sepsis.  Patient was also found to have extremely elevated glucose and anion gap.  Given this constellation of findings do have concern for possible DKA as well as possible sepsis.  Patient was ordered broad-spectrum antibiotics as well as IV insulin and further fluids.  Will plan on admission.  ____________________________________________   FINAL CLINICAL IMPRESSION(S) / ED DIAGNOSES  Final diagnoses:  Diabetic ketoacidosis without coma associated with diabetes mellitus due to underlying condition ([place])  Hypotension, unspecified hypotension type  AKI (acute kidney injury) ([place])     Note: This dictation was prepared with Dragon dictation. Any transcriptional errors that result from this process are unintentional     Nance Pear, MD 08/04/19 2332

## 2019-08-04 NOTE — ED Notes (Signed)
Pt unable to sign consent for blood.  No family here.

## 2019-08-04 NOTE — ED Notes (Signed)
Pt brought in via ems from home with bloody emesis.  Pt lethargic.  Bloody emesis around mouth.  Sinus tach on monitor.  Pt placed on 2 liters oxygen.  Iv in place

## 2019-08-04 NOTE — ED Notes (Addendum)
Pt uncooperative.  States leave me alone.  Pt pulling off pulse ox.  Pt trying to bite staff.  Pt uncooperative during foley cath insertion.  Iv insulin started.  fsbs HI.  Pt pulling off leads on chest.    md aware.   md states start insulin first, pt has only 2 iv's.  Iv team unable to obtain any blood samples.

## 2019-08-04 NOTE — H&P (Addendum)
[facility] NAME: Kristy [name]    MR#:  [id number]  DATE OF BIRTH:  Apr 12, 1951  DATE OF ADMISSION:  08/04/2019  PRIMARY CARE PHYSICIAN: Owens Loffler, MD   REQUESTING/REFERRING PHYSICIAN: Theone Murdoch, MD  CHIEF COMPLAINT:   Chief Complaint  Patient presents with  . Hematemesis    HISTORY OF PRESENT ILLNESS:  Kristy [name]  is a 68 y.o. female with a known history of multiple medical problems that will be mentioned below, including coronary artery disease, nonischemic cardiomyopathy, Crohn's disease and type 2 diabetes mellitus with history of DKA, presented to emergency room with acute onset of hematemesis and altered mental status with decreased responsiveness.  Per her niece and power of attorney Ms. Marisue Humble called at [phone], the patient has been deteriorating over the last few weeks.  She has been refusing care.  She has been noncompliant with her medications.  She has not been eating well over the last few days.  She has a caretaker coming to help her.  She has been sleeping most of the day and has not eaten much or taken her medications.  She did not answer her niece today twice when she called she her.  She found in her room in and out of consciousness.  She believes she has been vomiting coffee-ground emesis.  No history could be obtained from the patient who is fairly lethargic and very somnolent.  Upon presentation to the emergency room, blood pressure was 86/39, heart rate 127, respiratory rate 16 approximately 94% on room air with a temperature of 97.9.  Labs are remarkable for a blood glucose of 1324, potassium of 5.1 and chloride of 90 with CO2 21 anion gap of 26, total protein of 8.7.  Urinalysis came back positive for UTI with more than 50 WBCs and present budding yeast.  It showed more than 500 glucose and 5 ketones.  EKG showed sinus tachycardia with a rate of 131 with left bundle branch block.  COVID-19 test came back  negative.  The patient was given IV vancomycin and cefepime as well as 2 L bolus of IV normal saline, IV Protonix bolus and will be admitted to stepdown unit for further evaluation and management.  PAST MEDICAL HISTORY:   Past Medical History:  Diagnosis Date  . Allergic rhinitis   . Allergy   . Cataract    mild   . Crohn's disease ([place]) 10/13/2013  . Diverticulosis   . GERD (gastroesophageal reflux disease)   . Gout   . HFrEF (heart failure with reduced ejection fraction) ([place])    a. 12/2008 Cath: EF 45% w/ inf HK; b. 07/2009 Echo: EF 50-55%; c. 08/2018 Echo: EF 20-25%.  Marland Kitchen Hyperlipidemia   . Hypertension   . IBS (irritable bowel syndrome)   . Left bundle branch block   . Neuromuscular disorder ([facility])    neuropathy  . NICM (nonischemic cardiomyopathy) ([place])    a. 12/2008 Cath: no significant dzs, EF 45% w/ inf HK->Med Rx; b. 07/2009 Echo: EF 50-55%; c. 08/2018 Echo: EF 20-25%, ant/antsept HK, mild MR, mildly dil LA, nl RV fx; d. 08/2018 Cath: D1 80, otw nonobs dzs->Med rx.  . Non-obstructive CAD (coronary artery disease)    a. 12/2008 Cath: no significant dzs, EF 45% w/ inf HK->Med Rx; b. 08/2018 Cath: LM nl, LAD min irregs, D1 80, LCX 42md, RCA min irregs->Med Rx.  . Osteoarthritis   . Poorly controlled Diabetes mellitus    a. 07/2018  A1c 13.1.  Marland Kitchen Symptomatic cholelithiasis   . Uncontrolled type 2 diabetes mellitus with stage 3 chronic kidney disease, with long-term current use of insulin ([place])          PAST SURGICAL HISTORY:   Past Surgical History:  Procedure Laterality Date  . BREAST BIOPSY Right 06/24/2019   stereo bx/ x clip/ path pending  . BREAST CYST ASPIRATION    . COLONOSCOPY    . DILATION AND CURETTAGE OF UTERUS    . LEFT HEART CATH AND CORONARY ANGIOGRAPHY N/A 04/24/2019   Procedure: LEFT HEART CATH AND CORONARY ANGIOGRAPHY;  Surgeon: Nelva Bush, MD;  Location: [facility];  Service: Cardiovascular;  Laterality: N/A;  . RIGHT/LEFT HEART CATH AND  CORONARY ANGIOGRAPHY N/A 08/26/2018   Procedure: RIGHT/LEFT HEART CATH AND CORONARY ANGIOGRAPHY;  Surgeon: Wellington Hampshire, MD;  Location: [facility];  Service: Cardiovascular;  Laterality: N/A;  . SHOULDER SURGERY     15 + yrs ago   . SKIN SURGERY     nose   . VAGINAL HYSTERECTOMY      SOCIAL HISTORY:   Social History   Tobacco Use  . Smoking status: Former Smoker    Packs/day: 0.75    Years: 30.00    Pack years: 22.50    Types: Cigarettes    Quit date: 06/06/1997    Years since quitting: 22.1  . Smokeless tobacco: Never Used  Substance Use Topics  . Alcohol use: No    FAMILY HISTORY:   Family History  Problem Relation Age of Onset  . Kidney failure Mother   . Hypertension Father   . Kidney failure Brother   . Colon cancer Neg Hx   . Colon polyps Neg Hx   . Rectal cancer Neg Hx   . Stomach cancer Neg Hx   . Breast cancer Neg Hx     DRUG ALLERGIES:   Allergies  Allergen Reactions  . Fish Oil Other (See Comments)    Gout  . Glimepiride Other (See Comments)    REACTION: hypoglycemia  . Guanfacine Hcl Other (See Comments)    REACTION: unspecified  . Rosiglitazone Other (See Comments)    CHF    REVIEW OF SYSTEMS:   ROS As per history of present illness. All pertinent systems were reviewed above. Constitutional,  HEENT, cardiovascular, respiratory, GI, GU, musculoskeletal, neuro, psychiatric, endocrine,  integumentary and hematologic systems were reviewed and are otherwise  negative/unremarkable except for positive findings mentioned above in the HPI.   MEDICATIONS AT HOME:   Prior to Admission medications   Medication Sig Start Date End Date Taking? Authorizing Provider  acetaminophen (TYLENOL) 325 MG tablet Take 325-650 mg by mouth daily as needed for moderate pain or headache.    Yes [provider]  aspirin EC 81 MG EC tablet Take 1 tablet (81 mg total) by mouth daily. 04/29/19  Yes Sudini, Alveta Heimlich, MD  carvedilol (COREG) 12.5 MG  tablet Take 1 tablet (12.5 mg total) by mouth 2 (two) times daily. 06/11/19 09/09/19 Yes Minna Merritts, MD  clopidogrel (PLAVIX) 75 MG tablet Take 1 tablet (75 mg total) by mouth daily with breakfast. 06/11/19  Yes Gollan, Kathlene November, MD  CORLANOR 5 MG TABS tablet TAKE ONE TABLET TWICE DAILY WITH MEALS Patient taking differently: Take 5 mg by mouth 2 (two) times daily with a meal.  05/12/19  Yes Theora Gianotti, NP  ENTRESTO 49-51 MG TAKE ONE TABLET TWICE DAILY STARTING SUNDAY 09/15/18 Patient taking differently:  Take 1 tablet by mouth 2 (two) times daily.  05/05/19  Yes Minna Merritts, MD  FLUoxetine (PROZAC) 20 MG capsule Take 1 capsule (20 mg total) by mouth daily. 02/12/19  Yes Copland, [name], MD  insulin regular human CONCENTRATED (HUMULIN R U-500 KWIKPEN) 500 UNIT/ML kwikpen Inject 50-100 Units into the skin 4 (four) times daily -  before meals and at bedtime. Give 0 units insulin for blood sugar < 200  Give 20 units insulin for blood sugar 201 - 300 Give 50 units insulin for blood sugar 301 - 400 Give 90 units insulin for blood sugar 401 - 500 Give 100 units insulin for blood sugar > 500 . 07/14/19 11/11/19 Yes Gladstone Lighter, MD  spironolactone (ALDACTONE) 25 MG tablet Take 1 tablet (25 mg total) by mouth daily. 08/04/19  Yes Minna Merritts, MD  atorvastatin (LIPITOR) 80 MG tablet Take 1 tablet (80 mg total) by mouth daily at 6 PM. Patient not taking: Reported on 07/31/2019 05/19/19   Minna Merritts, MD  glucose blood (ONETOUCH ULTRA) test strip Use to check blood sugar up to 8 times a day. 06/25/19   Copland, [name], MD  Insulin Syringe-Needle U-100 (B-D INS SYRINGE 0.5CC/31GX5/16) 31G X 5/16" 0.5 ML MISC USE AS DIRECTED THREE TIMES A DAY 08/06/17   Copland, [name], MD      VITAL SIGNS:  Blood pressure 120/73, pulse (!) 119, temperature 97.9 F (36.6 C), temperature source Oral, resp. rate (!) 23, height 5' 3"  (1.6 m), weight 72.6 kg, SpO2 92 %.  PHYSICAL EXAMINATION:   Physical Exam  GENERAL:  68 y.o.-year-old Caucasian female patient lying in the bed with no acute distress.  She was fairly somnolent and lethargic, not following commands. EYES: Pupils equal, round, reactive to light and accommodation. No scleral icterus. Extraocular muscles intact.  HEENT: Head atraumatic, normocephalic. Oropharynx with dry tongue and mucous membrane and residual coffee-ground stained from emesis on her tongue.  Nose clear. NECK:  Supple, no jugular venous distention. No thyroid enlargement, no tenderness.  LUNGS: Normal breath sounds bilaterally, no wheezing, rales,rhonchi or crepitation. No use of accessory muscles of respiration.  CARDIOVASCULAR: Regular rate and rhythm, S1, S2 normal. No murmurs, rubs, or gallops.  ABDOMEN: Soft, nondistended, nontender. Bowel sounds present. No organomegaly or mass.  EXTREMITIES: No pedal edema, cyanosis, or clubbing.  NEUROLOGIC: She had no lateralizing signs.  Soft palate lethargic and not following commands. PSYCHIATRIC: The patient was fairly lethargic decreased responsiveness SKIN: No obvious rash, lesion, or ulcer.   LABORATORY PANEL:   CBC Recent Labs  Lab 08/04/19 2057  WBC 28.9*  HGB 15.8*  HCT 53.7*  PLT 364   ------------------------------------------------------------------------------------------------------------------  Chemistries  Recent Labs  Lab 08/04/19 2057  NA 137  K 5.1  CL 90*  CO2 21*  GLUCOSE 1,324*  BUN 72*  CREATININE 2.65*  CALCIUM 10.8*  AST 22  ALT 19  ALKPHOS 151*  BILITOT 1.1   ------------------------------------------------------------------------------------------------------------------  Cardiac Enzymes No results for input(s): TROPONINI in the last 168 hours. ------------------------------------------------------------------------------------------------------------------  RADIOLOGY:  Dg Chest Portable 1 View  Result Date: 08/04/2019 CLINICAL DATA:  Altered mental  status. EXAM: PORTABLE CHEST 1 VIEW COMPARISON:  04/23/2011 FINDINGS: Heart and mediastinal contours are within normal limits. No focal opacities or effusions. No acute bony abnormality. IMPRESSION: No active disease. Electronically Signed   By: Rolm Baptise M.D.   On: 08/04/2019 22:27      IMPRESSION AND PLAN:   1.  DKA with uncontrolled type II  response. The patient will be admitted to a stepdown bed and will be  placed on IV insulin drip.  The patient will be aggressively hydrated with IV normal saline.  Will follow serial BMPs and check Hemoglobin A1C.  I notified the e- link intensivist regarding the admission.  2.  Sepsis with severe sepsis and likely septic shock secondary to UTI.  This is manifested by her tachycardia, hypotension which has been recurrent despite IV hydration and significant leukocytosis.  The patient will be continued on antibiotic therapy with IV cefepime and vancomycin and we will follow urine and blood cultures.  Sepsis protocol was started in the ER.  3.  GI bleeding.  The patient will be placed on IV Protonix drip after bolus given in the ER.  GI consultation will be obtained.  I notified Dr. Marius Ditch about the patient.  We will follow serial hemoglobin and hematocrits.  We will stop aspirin and Plavix.  She does not need blood transfusions at this time.  4.  Acute kidney injury.  She will be aggressively hydrated with IV normal saline and will follow her BMP  5.  Dyslipidemia.  Statin therapy will be resumed.  6.  Depression.  Prozac will be resumed.  7.  Nonischemic cardiomyopathy.  We will continue her ivabradine.  8.  DVT prophylaxis.  SCDs.  Medical prophylaxis currently contraindicated due to GI bleeding.  9.  GI prophylaxis.  This was addressed above.   All the records are reviewed and case discussed with ED provider. The plan of care was discussed in details with the patient's niece and power of attorney.  She understands the patient's guarded prognosis  and she wanted to keep her full code at this time.  I answered all questions. The patient's neice  agreed to proceed with the above mentioned plan. Further management will depend upon hospital course.   CODE STATUS: Full code, this was discussed with the patient's niece and power of attorney.  TOTAL CRITICAL CARE TIME TAKING CARE OF THIS PATIENT: 60 minutes.    Christel Mormon M.D on 08/04/2019 at 11:27 PM  [facility]   From 7 PM-7 AM, contact night-coverage [email or web address]  CC: Primary care physician; Owens Loffler, MD   Note: This dictation was prepared with Dragon dictation along with smaller phrase technology. Any transcriptional errors that result from this process are unintentional.

## 2019-08-05 ENCOUNTER — Inpatient Hospital Stay: Payer: Medicare HMO

## 2019-08-05 ENCOUNTER — Ambulatory Visit: Payer: Medicare HMO | Admitting: Podiatry

## 2019-08-05 ENCOUNTER — Encounter: Payer: Self-pay | Admitting: Internal Medicine

## 2019-08-05 DIAGNOSIS — R6520 Severe sepsis without septic shock: Secondary | ICD-10-CM

## 2019-08-05 DIAGNOSIS — I959 Hypotension, unspecified: Secondary | ICD-10-CM

## 2019-08-05 DIAGNOSIS — A419 Sepsis, unspecified organism: Principal | ICD-10-CM

## 2019-08-05 DIAGNOSIS — E870 Hyperosmolality and hypernatremia: Secondary | ICD-10-CM

## 2019-08-05 DIAGNOSIS — K920 Hematemesis: Secondary | ICD-10-CM

## 2019-08-05 LAB — GLUCOSE, CAPILLARY
Glucose-Capillary: 118 mg/dL — ABNORMAL HIGH (ref 70–99)
Glucose-Capillary: 127 mg/dL — ABNORMAL HIGH (ref 70–99)
Glucose-Capillary: 128 mg/dL — ABNORMAL HIGH (ref 70–99)
Glucose-Capillary: 140 mg/dL — ABNORMAL HIGH (ref 70–99)
Glucose-Capillary: 150 mg/dL — ABNORMAL HIGH (ref 70–99)
Glucose-Capillary: 160 mg/dL — ABNORMAL HIGH (ref 70–99)
Glucose-Capillary: 173 mg/dL — ABNORMAL HIGH (ref 70–99)
Glucose-Capillary: 173 mg/dL — ABNORMAL HIGH (ref 70–99)
Glucose-Capillary: 181 mg/dL — ABNORMAL HIGH (ref 70–99)
Glucose-Capillary: 183 mg/dL — ABNORMAL HIGH (ref 70–99)
Glucose-Capillary: 192 mg/dL — ABNORMAL HIGH (ref 70–99)
Glucose-Capillary: 207 mg/dL — ABNORMAL HIGH (ref 70–99)
Glucose-Capillary: 212 mg/dL — ABNORMAL HIGH (ref 70–99)
Glucose-Capillary: 391 mg/dL — ABNORMAL HIGH (ref 70–99)
Glucose-Capillary: 421 mg/dL — ABNORMAL HIGH (ref 70–99)
Glucose-Capillary: 479 mg/dL — ABNORMAL HIGH (ref 70–99)
Glucose-Capillary: 596 mg/dL (ref 70–99)
Glucose-Capillary: >600 mg/dL (ref 70–99)
Glucose-Capillary: >600 mg/dL (ref 70–99)
Glucose-Capillary: >600 mg/dL (ref 70–99)

## 2019-08-05 LAB — CBC WITH DIFFERENTIAL/PLATELET
Abs Immature Granulocytes: 0.25 10*3/uL — ABNORMAL HIGH (ref 0.00–0.07)
Abs Immature Granulocytes: 0.23 10*3/uL — ABNORMAL HIGH (ref 0.00–0.07)
Basophils Absolute: 0.1 10*3/uL (ref 0.0–0.1)
Basophils Absolute: 0.1 10*3/uL (ref 0.0–0.1)
Basophils Relative: 0 %
Basophils Relative: 0 %
Eosinophils Absolute: 0 10*3/uL (ref 0.0–0.5)
Eosinophils Absolute: 0 10*3/uL (ref 0.0–0.5)
Eosinophils Relative: 0 %
Eosinophils Relative: 0 %
HCT: 56.1 % — ABNORMAL HIGH (ref 36.0–46.0)
HCT: 45.2 % (ref 36.0–46.0)
Hemoglobin: 16.8 g/dL — ABNORMAL HIGH (ref 12.0–15.0)
Hemoglobin: 15 g/dL (ref 12.0–15.0)
Immature Granulocytes: 1 %
Immature Granulocytes: 1 %
Lymphocytes Relative: 9 %
Lymphocytes Relative: 11 %
Lymphs Abs: 2.3 10*3/uL (ref 0.7–4.0)
Lymphs Abs: 3.2 10*3/uL (ref 0.7–4.0)
MCH: 29.2 pg (ref 26.0–34.0)
MCH: 29.7 pg (ref 26.0–34.0)
MCHC: 29.9 g/dL — ABNORMAL LOW (ref 30.0–36.0)
MCHC: 33.2 g/dL (ref 30.0–36.0)
MCV: 97.4 fL (ref 80.0–100.0)
MCV: 89.5 fL (ref 80.0–100.0)
Monocytes Absolute: 1 10*3/uL (ref 0.1–1.0)
Monocytes Absolute: 1.6 10*3/uL — ABNORMAL HIGH (ref 0.1–1.0)
Monocytes Relative: 4 %
Monocytes Relative: 6 %
Neutro Abs: 23.1 10*3/uL — ABNORMAL HIGH (ref 1.7–7.7)
Neutro Abs: 23.1 10*3/uL — ABNORMAL HIGH (ref 1.7–7.7)
Neutrophils Relative %: 86 %
Neutrophils Relative %: 82 %
Platelets: 318 10*3/uL (ref 150–400)
Platelets: 217 10*3/uL (ref 150–400)
RBC: 5.76 MIL/uL — ABNORMAL HIGH (ref 3.87–5.11)
RBC: 5.05 MIL/uL (ref 3.87–5.11)
RDW: 14 % (ref 11.5–15.5)
RDW: 13.3 % (ref 11.5–15.5)
Smear Review: NORMAL
WBC: 26.7 10*3/uL — ABNORMAL HIGH (ref 4.0–10.5)
WBC: 28.1 10*3/uL — ABNORMAL HIGH (ref 4.0–10.5)
nRBC: 0 % (ref 0.0–0.2)
nRBC: 0 % (ref 0.0–0.2)

## 2019-08-05 LAB — BASIC METABOLIC PANEL
Anion gap: 19 — ABNORMAL HIGH (ref 5–15)
Anion gap: 11 (ref 5–15)
Anion gap: 12 (ref 5–15)
Anion gap: 10 (ref 5–15)
Anion gap: 11 (ref 5–15)
Anion gap: 10 (ref 5–15)
BUN: 75 mg/dL — ABNORMAL HIGH (ref 8–23)
BUN: 66 mg/dL — ABNORMAL HIGH (ref 8–23)
BUN: 60 mg/dL — ABNORMAL HIGH (ref 8–23)
BUN: 53 mg/dL — ABNORMAL HIGH (ref 8–23)
BUN: 56 mg/dL — ABNORMAL HIGH (ref 8–23)
BUN: 47 mg/dL — ABNORMAL HIGH (ref 8–23)
CO2: 23 mmol/L (ref 22–32)
CO2: 24 mmol/L (ref 22–32)
CO2: 21 mmol/L — ABNORMAL LOW (ref 22–32)
CO2: 22 mmol/L (ref 22–32)
CO2: 22 mmol/L (ref 22–32)
CO2: 21 mmol/L — ABNORMAL LOW (ref 22–32)
Calcium: 10.4 mg/dL — ABNORMAL HIGH (ref 8.9–10.3)
Calcium: 9 mg/dL (ref 8.9–10.3)
Calcium: 9.3 mg/dL (ref 8.9–10.3)
Calcium: 9.3 mg/dL (ref 8.9–10.3)
Calcium: 9.3 mg/dL (ref 8.9–10.3)
Calcium: 8.9 mg/dL (ref 8.9–10.3)
Chloride: 100 mmol/L (ref 98–111)
Chloride: 119 mmol/L — ABNORMAL HIGH (ref 98–111)
Chloride: 124 mmol/L — ABNORMAL HIGH (ref 98–111)
Chloride: 124 mmol/L — ABNORMAL HIGH (ref 98–111)
Chloride: 124 mmol/L — ABNORMAL HIGH (ref 98–111)
Chloride: 121 mmol/L — ABNORMAL HIGH (ref 98–111)
Creatinine, Ser: 2.56 mg/dL — ABNORMAL HIGH (ref 0.44–1.00)
Creatinine, Ser: 2.09 mg/dL — ABNORMAL HIGH (ref 0.44–1.00)
Creatinine, Ser: 1.74 mg/dL — ABNORMAL HIGH (ref 0.44–1.00)
Creatinine, Ser: 1.64 mg/dL — ABNORMAL HIGH (ref 0.44–1.00)
Creatinine, Ser: 1.68 mg/dL — ABNORMAL HIGH (ref 0.44–1.00)
Creatinine, Ser: 1.53 mg/dL — ABNORMAL HIGH (ref 0.44–1.00)
GFR calc Af Amer: 22 mL/min — ABNORMAL LOW (ref >60)
GFR calc Af Amer: 27 mL/min — ABNORMAL LOW (ref >60)
GFR calc Af Amer: 34 mL/min — ABNORMAL LOW (ref >60)
GFR calc Af Amer: 36 mL/min — ABNORMAL LOW (ref >60)
GFR calc Af Amer: 37 mL/min — ABNORMAL LOW (ref >60)
GFR calc Af Amer: 40 mL/min — ABNORMAL LOW (ref >60)
GFR calc non Af Amer: 19 mL/min — ABNORMAL LOW (ref >60)
GFR calc non Af Amer: 24 mL/min — ABNORMAL LOW (ref >60)
GFR calc non Af Amer: 30 mL/min — ABNORMAL LOW (ref >60)
GFR calc non Af Amer: 31 mL/min — ABNORMAL LOW (ref >60)
GFR calc non Af Amer: 32 mL/min — ABNORMAL LOW (ref >60)
GFR calc non Af Amer: 35 mL/min — ABNORMAL LOW (ref >60)
Glucose, Bld: 1166 mg/dL (ref 70–99)
Glucose, Bld: 525 mg/dL (ref 70–99)
Glucose, Bld: 225 mg/dL — ABNORMAL HIGH (ref 70–99)
Glucose, Bld: 129 mg/dL — ABNORMAL HIGH (ref 70–99)
Glucose, Bld: 130 mg/dL — ABNORMAL HIGH (ref 70–99)
Glucose, Bld: 227 mg/dL — ABNORMAL HIGH (ref 70–99)
Potassium: 4 mmol/L (ref 3.5–5.1)
Potassium: 3.5 mmol/L (ref 3.5–5.1)
Potassium: 3.5 mmol/L (ref 3.5–5.1)
Potassium: 3.7 mmol/L (ref 3.5–5.1)
Potassium: 3.8 mmol/L (ref 3.5–5.1)
Potassium: 3.7 mmol/L (ref 3.5–5.1)
Sodium: 142 mmol/L (ref 135–145)
Sodium: 154 mmol/L — ABNORMAL HIGH (ref 135–145)
Sodium: 157 mmol/L — ABNORMAL HIGH (ref 135–145)
Sodium: 156 mmol/L — ABNORMAL HIGH (ref 135–145)
Sodium: 157 mmol/L — ABNORMAL HIGH (ref 135–145)
Sodium: 152 mmol/L — ABNORMAL HIGH (ref 135–145)

## 2019-08-05 LAB — LACTIC ACID, PLASMA
Lactic Acid, Venous: 4.7 mmol/L (ref 0.5–1.9)
Lactic Acid, Venous: 4.7 mmol/L (ref 0.5–1.9)
Lactic Acid, Venous: 3.9 mmol/L (ref 0.5–1.9)

## 2019-08-05 LAB — BPAM RBC
Blood Product Expiration Date: 202011262359
Blood Product Expiration Date: 202011262359
ISSUE DATE / TIME: 202011022117
ISSUE DATE / TIME: 202011022117
Unit Type and Rh: 5100
Unit Type and Rh: 5100

## 2019-08-05 LAB — TYPE AND SCREEN
ABO/RH(D): O NEG
Antibody Screen: NEGATIVE
Unit division: 0
Unit division: 0

## 2019-08-05 LAB — PROTIME-INR
INR: 1.3 — ABNORMAL HIGH (ref 0.8–1.2)
Prothrombin Time: 15.9 seconds — ABNORMAL HIGH (ref 11.4–15.2)

## 2019-08-05 LAB — URINE DRUG SCREEN, QUALITATIVE (ARMC ONLY)
Amphetamines, Ur Screen: NOT DETECTED
Barbiturates, Ur Screen: NOT DETECTED
Benzodiazepine, Ur Scrn: NOT DETECTED
Cannabinoid 50 Ng, Ur ~~LOC~~: NOT DETECTED
Cocaine Metabolite,Ur ~~LOC~~: NOT DETECTED
MDMA (Ecstasy)Ur Screen: NOT DETECTED
Methadone Scn, Ur: NOT DETECTED
Opiate, Ur Screen: NOT DETECTED
Phencyclidine (PCP) Ur S: NOT DETECTED
Tricyclic, Ur Screen: NOT DETECTED

## 2019-08-05 LAB — VANCOMYCIN, RANDOM: Vancomycin Rm: 21

## 2019-08-05 LAB — APTT: aPTT: 28 seconds (ref 24–36)

## 2019-08-05 LAB — HEMOGLOBIN AND HEMATOCRIT, BLOOD
HCT: 44.9 % (ref 36.0–46.0)
Hemoglobin: 14.9 g/dL (ref 12.0–15.0)

## 2019-08-05 LAB — MRSA PCR SCREENING: MRSA by PCR: NEGATIVE

## 2019-08-05 LAB — ETHANOL: Alcohol, Ethyl (B): <10 mg/dL (ref <10)

## 2019-08-05 LAB — PROCALCITONIN: Procalcitonin: 0.41 ng/mL

## 2019-08-05 MED ORDER — INSULIN REGULAR BOLUS VIA INFUSION
10.0000 [IU] | Freq: Once | INTRAVENOUS | Status: AC
  Administered 2019-08-05: 10 [IU] via INTRAVENOUS
  Filled 2019-08-05: qty 10

## 2019-08-05 MED ORDER — PANTOPRAZOLE SODIUM 40 MG IV SOLR: 40.0000 mg | Freq: Two times a day (BID) | INTRAVENOUS | Status: DC

## 2019-08-05 MED ORDER — SODIUM CHLORIDE 0.9 % IV SOLN: 8.0000 mg/h | INTRAVENOUS | Status: DC

## 2019-08-05 MED ORDER — INSULIN GLARGINE 100 UNIT/ML ~~LOC~~ SOLN
18.0000 [IU] | Freq: Two times a day (BID) | SUBCUTANEOUS | Status: DC
Start: 2019-08-06 — End: 2019-08-11
  Administered 2019-08-06 – 2019-08-10 (×10): 18 [IU] via SUBCUTANEOUS
  Filled 2019-08-05 (×14): qty 0.18

## 2019-08-05 MED ORDER — DEXTROSE 5 % IV SOLN
INTRAVENOUS | Status: DC
  Administered 2019-08-05: 11:00:00 via INTRAVENOUS

## 2019-08-05 MED ORDER — VANCOMYCIN HCL 10 G IV SOLR: 1500.0000 mg | Freq: Once | INTRAVENOUS | Status: DC

## 2019-08-05 MED ORDER — CHLORHEXIDINE GLUCONATE CLOTH 2 % EX PADS
6.0000 | MEDICATED_PAD | Freq: Every day | CUTANEOUS | Status: DC
  Administered 2019-08-06 – 2019-08-12 (×4): 6 via TOPICAL

## 2019-08-05 MED ORDER — INSULIN GLARGINE 100 UNIT/ML ~~LOC~~ SOLN
18.0000 [IU] | Freq: Once | SUBCUTANEOUS | Status: AC
  Administered 2019-08-05: 18 [IU] via SUBCUTANEOUS
  Filled 2019-08-05: qty 0.18

## 2019-08-05 MED ORDER — DEXTROSE 5 % IV SOLN: INTRAVENOUS | Status: DC

## 2019-08-05 MED ORDER — LACTATED RINGERS IV SOLN: INTRAVENOUS | Status: DC

## 2019-08-05 MED ORDER — INSULIN ASPART 100 UNIT/ML ~~LOC~~ SOLN
0.0000 [IU] | SUBCUTANEOUS | Status: DC
  Administered 2019-08-05 – 2019-08-06 (×2): 4 [IU] via SUBCUTANEOUS
  Administered 2019-08-06 (×4): 7 [IU] via SUBCUTANEOUS
  Administered 2019-08-06: 4 [IU] via SUBCUTANEOUS
  Administered 2019-08-07: 3 [IU] via SUBCUTANEOUS
  Administered 2019-08-07 (×2): 7 [IU] via SUBCUTANEOUS
  Administered 2019-08-07 (×2): 4 [IU] via SUBCUTANEOUS
  Administered 2019-08-07 (×2): 7 [IU] via SUBCUTANEOUS
  Administered 2019-08-08 – 2019-08-09 (×4): 4 [IU] via SUBCUTANEOUS
  Administered 2019-08-09: 7 [IU] via SUBCUTANEOUS
  Administered 2019-08-09: 4 [IU] via SUBCUTANEOUS
  Administered 2019-08-09 (×2): 7 [IU] via SUBCUTANEOUS
  Administered 2019-08-09 – 2019-08-10 (×3): 4 [IU] via SUBCUTANEOUS
  Administered 2019-08-10: 7 [IU] via SUBCUTANEOUS
  Administered 2019-08-10: 4 [IU] via SUBCUTANEOUS
  Administered 2019-08-10: 20 [IU] via SUBCUTANEOUS
  Administered 2019-08-11: 11 [IU] via SUBCUTANEOUS
  Administered 2019-08-11: 3 [IU] via SUBCUTANEOUS
  Filled 2019-08-05 (×31): qty 1

## 2019-08-05 MED ORDER — MORPHINE SULFATE (PF) 2 MG/ML IV SOLN
1.0000 mg | INTRAVENOUS | Status: DC | PRN
  Administered 2019-08-05 – 2019-08-07 (×4): 1 mg via INTRAVENOUS
  Filled 2019-08-05 (×4): qty 1

## 2019-08-05 MED ORDER — SODIUM CHLORIDE 0.9 % IV SOLN
1.0000 g | INTRAVENOUS | Status: DC
  Administered 2019-08-05: 1 g via INTRAVENOUS
  Filled 2019-08-05: qty 1

## 2019-08-05 MED ORDER — VANCOMYCIN HCL 1.5 G IV SOLR
1500.0000 mg | Freq: Once | INTRAVENOUS | Status: AC
  Administered 2019-08-05: 1500 mg via INTRAVENOUS
  Filled 2019-08-05: qty 1500

## 2019-08-05 NOTE — ED Notes (Signed)
Labs reports unable to draw labs at this time since blood transfusion.

## 2019-08-05 NOTE — ED Notes (Signed)
fsbs high

## 2019-08-05 NOTE — ED Notes (Signed)
Blood transfusion complete at 2347.  No reaction noted.

## 2019-08-05 NOTE — Progress Notes (Signed)
Pharmacy Antibiotic Note  Kristy [name] is a 68 y.o. female admitted on 08/04/2019 with sepsis.  Pharmacy has been consulted for vanc/cefepime dosing.  Plan: Patient was ordered vanc 1.5g IV load and cefepime 2g IV x 1 in ED  Patient was admitted w/ decrease responsiveness and has hyperglycemia of CBG 1324 and is in AKI w/ a Scr of 2.65 from a baseline of 0.5 - 0.7. Goal random < 20 mcg/mL. Will check a random level w/ am labs and continue to assess.  Will continue cefepime 2g IV q24h per CrCl 11 - 29 ml/min and continue to monitor.  Height: 5' 3"  (160 cm) Weight: 160 lb (72.6 kg) IBW/kg (Calculated) : 52.4  Temp (24hrs), Avg:98.2 F (36.8 C), Min:97.9 F (36.6 C), Max:98.4 F (36.9 C)  Recent Labs  Lab 08/04/19 2057  WBC 28.9*  CREATININE 2.65*    Estimated Creatinine Clearance: 19.4 mL/min (A) (by C-G formula based on SCr of 2.65 mg/dL (H)).    Allergies  Allergen Reactions  . Fish Oil Other (See Comments)    Gout  . Glimepiride Other (See Comments)    REACTION: hypoglycemia  . Guanfacine Hcl Other (See Comments)    REACTION: unspecified  . Rosiglitazone Other (See Comments)    CHF    Thank you for allowing pharmacy to be a part of this patient's care.  Tobie Lords, PharmD, BCPS Clinical Pharmacist 08/05/2019 12:27 AM

## 2019-08-05 NOTE — Consult Note (Signed)
Kristy Antigua, MD [address], [place], [place], [place], [address] [address], [place], [place], [place], [address] Phone: [phone]  Fax: [phone]  Consultation  Referring Provider:     Dr. Sidney Ace Primary Care Physician:  Owens Loffler, MD Reason for Consultation:     GI bleed  Date of Admission:  08/04/2019 Date of Consultation:  08/05/2019         HPI:   Kristy [name] is a 68 y.o. female who is unable to provide history as she appears to be sleeping, but responsive to stimulation.  Admitted with DKA and hematemesis.  Brought in by her family.  Documentation reviewed and there are reports of her deteriorating over the last few weeks as far as refusing care, being noncompliant with her medications.  She was found in her room in and out of sleep.  She was found to have coffee-ground emesis at home.  No such episodes since coming to the hospital or since admission.  Has Plavix listed on her medication list.  Had a screening colonoscopy in January 2019 by Dr. Carlean Purl for screening, diverticulosis and 1 rectal polyp noted.  Otherwise normal.  Pathology showed hyperplastic polyp and recall for repeat colonoscopy was set for 2029.  Past Medical History:  Diagnosis Date  . Allergic rhinitis   . Allergy   . Cataract    mild   . Crohn's disease ([place] [facility]) 10/13/2013  . Diverticulosis   . GERD (gastroesophageal reflux disease)   . Gout   . HFrEF (heart failure with reduced ejection fraction) ([facility])    a. 12/2008 Cath: EF 45% w/ inf HK; b. 07/2009 Echo: EF 50-55%; c. 08/2018 Echo: EF 20-25%.  Marland Kitchen Hyperlipidemia   . Hypertension   . IBS (irritable bowel syndrome)   . Left bundle branch block   . Neuromuscular disorder (H[facility])    neuropathy  . NICM (nonischemic cardiomyopathy) ([place])    a. 12/2008 Cath: no significant dzs, EF 45% w/ inf HK->Med Rx; b. 07/2009 Echo: EF 50-55%; c. 08/2018 Echo: EF 20-25%, ant/antsept HK, mild MR, mildly dil LA, nl RV fx; d. 08/2018 Cath: D1 80,  otw nonobs dzs->Med rx.  . Non-obstructive CAD (coronary artery disease)    a. 12/2008 Cath: no significant dzs, EF 45% w/ inf HK->Med Rx; b. 08/2018 Cath: LM nl, LAD min irregs, D1 80, LCX 93md, RCA min irregs->Med Rx.  . Osteoarthritis   . Poorly controlled Diabetes mellitus    a. 07/2018 A1c 13.1.  .Marland KitchenSymptomatic cholelithiasis   . Uncontrolled type 2 diabetes mellitus with stage 3 chronic kidney disease, with long-term current use of insulin ([facility]          Past Surgical History:  Procedure Laterality Date  . BREAST BIOPSY Right 06/24/2019   stereo bx/ x clip/ path pending  . BREAST CYST ASPIRATION    . COLONOSCOPY    . DILATION AND CURETTAGE OF UTERUS    . LEFT HEART CATH AND CORONARY ANGIOGRAPHY N/A 04/24/2019   Procedure: LEFT HEART CATH AND CORONARY ANGIOGRAPHY;  Surgeon: ENelva Bush MD;  [facility];  Service: Cardiovascular;  Laterality: N/A;  . RIGHT/LEFT HEART CATH AND CORONARY ANGIOGRAPHY N/A 08/26/2018   Procedure: RIGHT/LEFT HEART CATH AND CORONARY ANGIOGRAPHY;  Surgeon: AWellington Hampshire MD;  [facility];  Service: Cardiovascular;  Laterality: N/A;  . SHOULDER SURGERY     15 + yrs ago   . SKIN SURGERY     nose   .  VAGINAL HYSTERECTOMY      Prior to Admission medications   Medication Sig Start Date End Date Taking? Authorizing Provider  acetaminophen (TYLENOL) 325 MG tablet Take 325-650 mg by mouth daily as needed for moderate pain or headache.    Yes [provider]  aspirin EC 81 MG EC tablet Take 1 tablet (81 mg total) by mouth daily. 04/29/19  Yes Sudini, Alveta Heimlich, MD  carvedilol (COREG) 12.5 MG tablet Take 1 tablet (12.5 mg total) by mouth 2 (two) times daily. 06/11/19 09/09/19 Yes Minna Merritts, MD  clopidogrel (PLAVIX) 75 MG tablet Take 1 tablet (75 mg total) by mouth daily with breakfast. 06/11/19  Yes Gollan, Kathlene November, MD  CORLANOR 5 MG TABS tablet TAKE ONE TABLET TWICE DAILY WITH MEALS Patient taking  differently: Take 5 mg by mouth 2 (two) times daily with a meal.  05/12/19  Yes Theora Gianotti, NP  ENTRESTO 49-51 MG TAKE ONE TABLET TWICE DAILY STARTING SUNDAY 09/15/18 Patient taking differently: Take 1 tablet by mouth 2 (two) times daily.  05/05/19  Yes Minna Merritts, MD  FLUoxetine (PROZAC) 20 MG capsule Take 1 capsule (20 mg total) by mouth daily. 02/12/19  Yes Copland, [name], MD  insulin regular human CONCENTRATED (HUMULIN R U-500 KWIKPEN) 500 UNIT/ML kwikpen Inject 50-100 Units into the skin 4 (four) times daily -  before meals and at bedtime. Give 0 units insulin for blood sugar < 200  Give 20 units insulin for blood sugar 201 - 300 Give 50 units insulin for blood sugar 301 - 400 Give 90 units insulin for blood sugar 401 - 500 Give 100 units insulin for blood sugar > 500 . 07/14/19 11/11/19 Yes Gladstone Lighter, MD  spironolactone (ALDACTONE) 25 MG tablet Take 1 tablet (25 mg total) by mouth daily. 08/04/19  Yes Minna Merritts, MD  atorvastatin (LIPITOR) 80 MG tablet Take 1 tablet (80 mg total) by mouth daily at 6 PM. Patient not taking: Reported on 07/31/2019 05/19/19   Minna Merritts, MD  glucose blood (ONETOUCH ULTRA) test strip Use to check blood sugar up to 8 times a day. 06/25/19   Copland, [name], MD  Insulin Syringe-Needle U-100 (B-D INS SYRINGE 0.5CC/31GX5/16) 31G X 5/16" 0.5 ML MISC USE AS DIRECTED THREE TIMES A DAY 08/06/17   Copland, [name], MD    Family History  Problem Relation Age of Onset  . Kidney failure Mother   . Hypertension Father   . Kidney failure Brother   . Colon cancer Neg Hx   . Colon polyps Neg Hx   . Rectal cancer Neg Hx   . Stomach cancer Neg Hx   . Breast cancer Neg Hx      Social History   Tobacco Use  . Smoking status: Former Smoker    Packs/day: 0.75    Years: 30.00    Pack years: 22.50    Types: Cigarettes    Quit date: 06/06/1997    Years since quitting: 22.1  . Smokeless tobacco: Never Used  Substance Use Topics  .  Alcohol use: No  . Drug use: No    Allergies as of 08/04/2019 - Review Complete 08/04/2019  Allergen Reaction Noted  . Fish oil Other (See Comments) 03/11/2015  . Glimepiride Other (See Comments) 10/31/2006  . Guanfacine hcl Other (See Comments) 10/31/2006  . Rosiglitazone Other (See Comments) 03/11/2015    Review of Systems:    All systems reviewed and negative except where noted in HPI.   Physical Exam:  Vital signs in last 24 hours: Vitals:   08/05/19 1100 08/05/19 1200 08/05/19 1300 08/05/19 1400  BP: 114/66 127/75 (!) 136/93 101/61  Pulse: (!) 101 (!) 104 100 97  Resp: (!) 24 (!) 27 (!) 23 19  Temp: 97.8 F (36.6 C)     TempSrc: Axillary     SpO2: 98% 97% 98% 98%  Weight:      Height:         General:   Pleasant, cooperative in NAD Head:  Normocephalic and atraumatic. Eyes:   No icterus.   Conjunctiva pink. PERRLA. Ears:  Normal auditory acuity. Neck:  Supple; no masses or thyroidomegaly Lungs: Respirations even and unlabored. Lungs clear to auscultation bilaterally.   No wheezes, crackles, or rhonchi.  Abdomen:  Soft, nondistended, nontender. Normal bowel sounds. No appreciable masses or hepatomegaly.  No rebound or guarding.  Neurologic:   grossly normal neurologically. Skin:  Intact without significant lesions or rashes. Cervical Nodes:  No significant cervical adenopathy. Psych:  Alert and cooperative. Normal affect.  LAB RESULTS: Recent Labs    08/04/19 2057 08/05/19 0057 08/05/19 0931 08/05/19 1324  WBC 28.9* 26.7*  --  28.1*  HGB 15.8* 16.8* 14.9 15.0  HCT 53.7* 56.1* 44.9 45.2  PLT 364 318  --  217   BMET Recent Labs    08/05/19 0546 08/05/19 0931 08/05/19 1324  NA 154* 157* 157*  156*  K 3.5 3.5 3.7  3.8  CL 119* 124* 124*  124*  CO2 24 21* 22  22  GLUCOSE 525* 225* 129*  130*  BUN 66* 60* 56*  53*  CREATININE 2.09* 1.74* 1.64*  1.68*  CALCIUM 9.0 9.3 9.3  9.3   LFT Recent Labs    08/04/19 2057  PROT 8.7*  ALBUMIN 3.8   AST 22  ALT 19  ALKPHOS 151*  BILITOT 1.1   PT/INR Recent Labs    08/04/19 2057 08/05/19 0125  LABPROT 15.7* 15.9*  INR 1.3* 1.3*    STUDIES: Ct Head Wo Contrast  Result Date: 08/05/2019 CLINICAL DATA:  Altered level of consciousness. Metabolic encephalopathy. Diabetic ketoacidosis. EXAM: CT HEAD WITHOUT CONTRAST TECHNIQUE: Contiguous axial images were obtained from the base of the skull through the vertex without intravenous contrast. COMPARISON:  April 19, 2019 FINDINGS: Brain: Mild diffuse atrophy is stable. There is no intracranial mass, hemorrhage, extra-axial fluid collection, or midline shift. There is mild patchy small vessel disease in the centra semiovale bilaterally, stable. No acute infarct is evident. Vascular: There is no hyperdense vessel. There is no appreciable vascular calcification. Skull: The bony calvarium appears intact. Sinuses/Orbits: There is mucosal thickening in several ethmoid air cells. Other visualized paranasal sinuses are clear. Orbits appear symmetric bilaterally. Other: Mastoid air cells are clear. IMPRESSION: Stable mild atrophy with mild periventricular small vessel disease. No acute infarct evident. No mass or hemorrhage. Mucosal thickening noted in several ethmoid air cells. Electronically Signed   By: Lowella Grip III M.D.   On: 08/05/2019 09:03   Dg Chest Portable 1 View  Result Date: 08/04/2019 CLINICAL DATA:  Altered mental status. EXAM: PORTABLE CHEST 1 VIEW COMPARISON:  04/23/2011 FINDINGS: Heart and mediastinal contours are within normal limits. No focal opacities or effusions. No acute bony abnormality. IMPRESSION: No active disease. Electronically Signed   By: Rolm Baptise M.D.   On: 08/04/2019 22:27      Impression / Plan:   SAMAH LAPIANA is a 68 y.o. y/o female with altered mental status at home, with  GI consulted for coffee-ground emesis at home  Patient is normal hemoglobin throughout this admission, no further signs of emesis,  is consistent with no active GI bleeding at this time  Coffee-ground emesis by itself is not specific for a GI bleed and can occur in other settings  Patient has an elevated white count, altered mental status with no obvious etiology  Would recommend further work-up for elevated white count, consider hematology consult, rule out all infectious causes  Agree with Protonix 40 IV twice daily or Protonix drip  If EGD is needed, upper endoscopy would be best done at least 3 to 5 days after last Plavix dose  No indication for urgent EGD at this time given no active GI bleeding and normal hemoglobin  PPI IV twice daily  Continue serial CBCs and transfuse PRN Avoid NSAIDs Maintain 2 large-bore IV lines Please page GI with any acute hemodynamic changes, or signs of active GI bleeding  With elevated lactate, consider CT abdomen pelvis given that patient had coffee-ground emesis at home, has elevated lactate, to rule out any underlying lesions and delineate the anatomy on CT scan   Thank you for involving me in the care of this patient.      LOS: 1 day   Virgel Manifold, MD  08/05/2019, 3:17 PM

## 2019-08-05 NOTE — Progress Notes (Addendum)
Progress Note    Kristy [name]  ZOX:[id number] DOB: 08-31-1951  DOA: 08/04/2019 PCP: Owens Loffler, MD      Assessment/Plan:   Active Problems:   DKA (diabetic ketoacidoses) ([place])   Hypotension   Hypernatremia   Body mass index is 24.21 kg/m.   Type II DM with DKA: Continue IV insulin drip and IV fluids.  Monitor glucose and adjust insulin infusion per protocol.  Sepsis secondary to UTI: Continue empiric IV antibiotics.  Follow-up blood culture and urine cultures.  Coffee-ground emesis: Continue IV Protonix.  Patient has been evaluated by gastroenterologist and he said there was no indication for urgent EGD at this time.  H&H is stable.  Plavix has been discontinued.  Acute kidney injury: Creatinine is trending down.  Continue IV fluids.  Monitor BMP  Hypernatremia: Continue IV fluids and monitor BMP  Chronic systolic CHF: 2D echo in July 2020 was 35 to 40%.  Aldactone is on hold.  Disposition: Plan of care was discussed with his niece who said that she would no longer be able to take care of the patient at home.  She is interested in SNF.  Case manager/social worker to assist with disposition at the appropriate time.   Family Communication/Anticipated D/C date and plan/Code Status   DVT prophylaxis: SCD Code Status: Full code Family Communication: Plan of care was discussed with her niece, Lucius Conn. Disposition Plan: Possible discharge to home in above 4 to 5 days      Subjective:   Patient is lethargic and unable to provide any history  Objective:    Vitals:   08/05/19 1300 08/05/19 1400 08/05/19 1500 08/05/19 1600  BP: (!) 136/93 101/61 120/73 131/74  Pulse: 100 97 (!) 106 (!) 105  Resp: (!) 23 19 (!) 24 (!) 27  Temp:    98.4 F (36.9 C)  TempSrc:    Oral  SpO2: 98% 98% 97% 95%  Weight:      Height:        Intake/Output Summary (Last 24 hours) at 08/05/2019 1721 Last data filed at 08/05/2019 1637 Gross per 24 hour  Intake 3117.58  ml  Output 2350 ml  Net 767.58 ml   Filed Weights   08/04/19 2054 08/05/19 0630  Weight: 72.6 kg 62 kg    Exam:  GEN: NAD SKIN: No rash EYES: EOMI ENT: MMM CV: RRR PULM: CTA B ABD: soft, ND, NT, +BS CNS: lethargic, difficult to examine because of altered mental status but she moves all extremities spontaneously EXT: No edema or tenderness   Data Reviewed:   I have personally reviewed following labs and imaging studies:  Labs: Labs show the following:   Basic Metabolic Panel: Recent Labs  Lab 08/04/19 2057 08/05/19 0125 08/05/19 0546 08/05/19 0931 08/05/19 1324  NA 137 142 154* 157* 157*  156*  K 5.1 4.0 3.5 3.5 3.7  3.8  CL 90* 100 119* 124* 124*  124*  CO2 21* 23 24 21* 22  22  GLUCOSE 1,324* 1,166* 525* 225* 129*  130*  BUN 72* 75* 66* 60* 56*  53*  CREATININE 2.65* 2.56* 2.09* 1.74* 1.64*  1.68*  CALCIUM 10.8* 10.4* 9.0 9.3 9.3  9.3   GFR Estimated Creatinine Clearance: 27.2 mL/min (A) (by C-G formula based on SCr of 1.64 mg/dL (H)). Liver Function Tests: Recent Labs  Lab 08/04/19 2057  AST 22  ALT 19  ALKPHOS 151*  BILITOT 1.1  PROT 8.7*  ALBUMIN 3.8   No  results for input(s): LIPASE, AMYLASE in the last 168 hours. No results for input(s): AMMONIA in the last 168 hours. Coagulation profile Recent Labs  Lab 08/04/19 2057 08/05/19 0125  INR 1.3* 1.3*    CBC: Recent Labs  Lab 08/04/19 2057 08/05/19 0057 08/05/19 0931 08/05/19 1324  WBC 28.9* 26.7*  --  28.1*  NEUTROABS  --  23.1*  --  23.1*  HGB 15.8* 16.8* 14.9 15.0  HCT 53.7* 56.1* 44.9 45.2  MCV 97.8 97.4  --  89.5  PLT 364 318  --  217   Cardiac Enzymes: No results for input(s): CKTOTAL, CKMB, CKMBINDEX, TROPONINI in the last 168 hours. BNP (last 3 results) No results for input(s): PROBNP in the last 8760 hours. CBG: Recent Labs  Lab 08/05/19 1232 08/05/19 1334 08/05/19 1428 08/05/19 1532 08/05/19 1627  GLUCAP 127* 128* 118* 150* 181*   D-Dimer: No results  for input(s): DDIMER in the last 72 hours. Hgb A1c: No results for input(s): HGBA1C in the last 72 hours. Lipid Profile: No results for input(s): CHOL, HDL, LDLCALC, TRIG, CHOLHDL, LDLDIRECT in the last 72 hours. Thyroid function studies: No results for input(s): TSH, T4TOTAL, T3FREE, THYROIDAB in the last 72 hours.  Invalid input(s): FREET3 Anemia work up: No results for input(s): VITAMINB12, FOLATE, FERRITIN, TIBC, IRON, RETICCTPCT in the last 72 hours. Sepsis Labs: Recent Labs  Lab 08/04/19 2057 08/05/19 0057 08/05/19 0125 08/05/19 0404 08/05/19 0659 08/05/19 1324  PROCALCITON  --   --   --   --   --  0.41  WBC 28.9* 26.7*  --   --   --  28.1*  LATICACIDVEN  --   --  4.7* 4.7* 3.9*  --     Microbiology Recent Results (from the past 240 hour(s))  SARS Coronavirus 2 by RT PCR (hospital order, performed in [facility] hospital lab) Nasopharyngeal Nasopharyngeal Swab     Status: None   Collection Time: 08/04/19  8:57 PM   Specimen: Nasopharyngeal Swab  Result Value Ref Range Status   SARS Coronavirus 2 NEGATIVE NEGATIVE Final    Comment: (NOTE) If result is NEGATIVE SARS-CoV-2 target nucleic acids are NOT DETECTED. The SARS-CoV-2 RNA is generally detectable in upper and lower  respiratory specimens during the acute phase of infection. The lowest  concentration of SARS-CoV-2 viral copies this assay can detect is 250  copies / mL. A negative result does not preclude SARS-CoV-2 infection  and should not be used as the sole basis for treatment or other  patient management decisions.  A negative result may occur with  improper specimen collection / handling, submission of specimen other  than nasopharyngeal swab, presence of viral mutation(s) within the  areas targeted by this assay, and inadequate number of viral copies  (<250 copies / mL). A negative result must be combined with clinical  observations, patient history, and epidemiological information. If result is POSITIVE  SARS-CoV-2 target nucleic acids are DETECTED. The SARS-CoV-2 RNA is generally detectable in upper and lower  respiratory specimens dur ing the acute phase of infection.  Positive  results are indicative of active infection with SARS-CoV-2.  Clinical  correlation with patient history and other diagnostic information is  necessary to determine patient infection status.  Positive results do  not rule out bacterial infection or co-infection with other viruses. If result is PRESUMPTIVE POSTIVE SARS-CoV-2 nucleic acids MAY BE PRESENT.   A presumptive positive result was obtained on the submitted specimen  and confirmed on repeat testing.  While 2019 novel coronavirus  (SARS-CoV-2) nucleic acids may be present in the submitted sample  additional confirmatory testing may be necessary for epidemiological  and / or clinical management purposes  to differentiate between  SARS-CoV-2 and other Sarbecovirus currently known to infect humans.  If clinically indicated additional testing with an alternate test  methodology [phone]) is advised. The SARS-CoV-2 RNA is generally  detectable in upper and lower respiratory sp ecimens during the acute  phase of infection. The expected result is Negative. Fact Sheet for Patients:  StrictlyIdeas.no Fact Sheet for Healthcare Providers: BankingDealers.co.za This test is not yet approved or cleared by the [place] FDA and has been authorized for detection and/or diagnosis of SARS-CoV-2 by FDA under an Emergency Use Authorization (EUA).  This EUA will remain in effect (meaning this test can be used) for the duration of the COVID-19 declaration under Section 564(b)(1) of the Act, 21 U.S.C. section 360bbb-3(b)(1), unless the authorization is terminated or revoked sooner. Performed at [facility], [address] [place], [place] [address]   Blood culture (routine x 2)     Status: None (Preliminary  result)   Collection Time: 08/05/19 12:56 AM   Specimen: BLOOD  Result Value Ref Range Status   Specimen Description BLOOD BLOOD LEFT HAND  Final   Special Requests   Final    BOTTLES DRAWN AEROBIC AND ANAEROBIC Blood Culture adequate volume   Culture   Final    NO GROWTH < 12 HOURS Performed at [facility], [address], [place], [place] [address]    Report Status PENDING  Incomplete  Blood culture (routine x 2)     Status: None (Preliminary result)   Collection Time: 08/05/19 12:58 AM   Specimen: BLOOD  Result Value Ref Range Status   Specimen Description BLOOD BLOOD RIGHT FOREARM  Final   Special Requests   Final    BOTTLES DRAWN AEROBIC AND ANAEROBIC Blood Culture results may not be optimal due to an inadequate volume of blood received in culture bottles   Culture   Final    NO GROWTH < 12 HOURS Performed at [facility], [address], [place], [place] [address]    Report Status PENDING  Incomplete  MRSA PCR Screening     Status: None   Collection Time: 08/05/19 11:56 AM   Specimen: Nasopharyngeal  Result Value Ref Range Status   MRSA by PCR NEGATIVE NEGATIVE Final    Comment:        The GeneXpert MRSA Assay (FDA approved for NASAL specimens only), is one component of a comprehensive MRSA colonization surveillance program. It is not intended to diagnose MRSA infection nor to guide or monitor treatment for MRSA infections. Performed at [facility], [address] [place], [place] [address]     Procedures and diagnostic studies:  Ct Head Wo Contrast  Result Date: 08/05/2019 CLINICAL DATA:  Altered level of consciousness. Metabolic encephalopathy. Diabetic ketoacidosis. EXAM: CT HEAD WITHOUT CONTRAST TECHNIQUE: Contiguous axial images were obtained from the base of the skull through the vertex without intravenous contrast. COMPARISON:  April 19, 2019 FINDINGS: Brain: Mild diffuse atrophy is stable. There is no intracranial mass,  hemorrhage, extra-axial fluid collection, or midline shift. There is mild patchy small vessel disease in the centra semiovale bilaterally, stable. No acute infarct is evident. Vascular: There is no hyperdense vessel. There is no appreciable vascular calcification. Skull: The bony calvarium appears intact. Sinuses/Orbits: There is mucosal thickening in several ethmoid air cells.  Other visualized paranasal sinuses are clear. Orbits appear symmetric bilaterally. Other: Mastoid air cells are clear. IMPRESSION: Stable mild atrophy with mild periventricular small vessel disease. No acute infarct evident. No mass or hemorrhage. Mucosal thickening noted in several ethmoid air cells. Electronically Signed   By: Lowella Grip III M.D.   On: 08/05/2019 09:03   Dg Chest Portable 1 View  Result Date: 08/04/2019 CLINICAL DATA:  Altered mental status. EXAM: PORTABLE CHEST 1 VIEW COMPARISON:  04/23/2011 FINDINGS: Heart and mediastinal contours are within normal limits. No focal opacities or effusions. No acute bony abnormality. IMPRESSION: No active disease. Electronically Signed   By: Rolm Baptise M.D.   On: 08/04/2019 22:27    Medications:   . [START ON 08/06/2019] Chlorhexidine Gluconate Cloth  6 each Topical Daily  . FLUoxetine  20 mg Oral Daily  . insulin aspart  0-20 Units Subcutaneous Q4H  . [START ON 08/06/2019] insulin glargine  18 Units Subcutaneous BID  . ivabradine  5 mg Oral BID WC  . [START ON 08/08/2019] pantoprazole  40 mg Intravenous Q12H   Continuous Infusions: . cefTRIAXone (ROCEPHIN)  IV    . dextrose 125 mL/hr at 08/05/19 1637  . insulin 4.6 mL/hr at 08/05/19 1637  . pantoprozole (PROTONIX) infusion 8 mg/hr (08/05/19 1637)     LOS: 1 day   Jaileen Janelle  [facility] Pager [phone].   *Please refer to [email or web address], password [email or web address] to get updated schedule on who will round on this patient, as hospitalists switch teams weekly. If 7PM-7AM, please contact night-coverage at  [email or web address], password [email or web address] for any overnight needs.  08/05/2019, 5:21 PM

## 2019-08-05 NOTE — ED Notes (Signed)
Pt calm, resting with eyes closed.  Sinus tach on monitor.  approx 800 cc urine in foley bag.  Pt on 2 liters oxygen.  Iv meds infusing.

## 2019-08-05 NOTE — ED Notes (Signed)
ED TO INPATIENT HANDOFF REPORT  ED Nurse Name and Phone #: Lattie Haw [phone]  T Name/Age/Gender Kristy [name] 68 y.o. female Room/Bed: ED25A/ED25A  Code Status   Code Status: Full Code  Home/SNF/Other Home Patient oriented to: self Is this baseline? Yes   Triage Complete: Triage complete  Chief Complaint Bloody Emesis  Triage Note Pt brought in via ems from home with bloody emesis.  Ems report pt was lying in bed with blood emesis today.  On arrival to er, iv in place. Bloody emesis around mouth.  Sinus tach on monitor.    Allergies Allergies  Allergen Reactions  . Fish Oil Other (See Comments)    Gout  . Glimepiride Other (See Comments)    REACTION: hypoglycemia  . Guanfacine Hcl Other (See Comments)    REACTION: unspecified  . Rosiglitazone Other (See Comments)    CHF    Level of Care/Admitting Diagnosis ED Disposition    ED Disposition Condition [facility] Area: [place] [[id number]]  Level of Care: Stepdown [14]  Covid Evaluation: Asymptomatic Screening Protocol (No Symptoms)  Diagnosis: DKA (diabetic ketoacidoses) [facility]) [[id number]]  Admitting Physician: Christel Mormon [[id number]]  Attending Physician: Christel Mormon [[id number]]  Estimated length of stay: 3 - 4 days  Certification:: I certify this patient will need inpatient services for at least 2 midnights  PT Class (Do Not Modify): Inpatient [101]  PT Acc Code (Do Not Modify): Private [1]       B Medical/Surgery History Past Medical History:  Diagnosis Date  . Allergic rhinitis   . Allergy   . Cataract    mild   . Crohn's disease ([place]) 10/13/2013  . Diverticulosis   . GERD (gastroesophageal reflux disease)   . Gout   . HFrEF (heart failure with reduced ejection fraction) ([place])    a. 12/2008 Cath: EF 45% w/ inf HK; b. 07/2009 Echo: EF 50-55%; c. 08/2018 Echo: EF 20-25%.  Marland Kitchen Hyperlipidemia   . Hypertension   . IBS (irritable bowel syndrome)   . Left bundle branch block    . Neuromuscular disorder (HCC)    neuropathy  . NICM (nonischemic cardiomyopathy) ([place])    a. 12/2008 Cath: no significant dzs, EF 45% w/ inf HK->Med Rx; b. 07/2009 Echo: EF 50-55%; c. 08/2018 Echo: EF 20-25%, ant/antsept HK, mild MR, mildly dil LA, nl RV fx; d. 08/2018 Cath: D1 80, otw nonobs dzs->Med rx.  . Non-obstructive CAD (coronary artery disease)    a. 12/2008 Cath: no significant dzs, EF 45% w/ inf HK->Med Rx; b. 08/2018 Cath: LM nl, LAD min irregs, D1 80, LCX 62md, RCA min irregs->Med Rx.  . Osteoarthritis   . Poorly controlled Diabetes mellitus    a. 07/2018 A1c 13.1.  .Marland KitchenSymptomatic cholelithiasis   . Uncontrolled type 2 diabetes mellitus with stage 3 chronic kidney disease, with long-term current use of insulin ([place]         Past Surgical History:  Procedure Laterality Date  . BREAST BIOPSY Right 06/24/2019   stereo bx/ x clip/ path pending  . BREAST CYST ASPIRATION    . COLONOSCOPY    . DILATION AND CURETTAGE OF UTERUS    . LEFT HEART CATH AND CORONARY ANGIOGRAPHY N/A 04/24/2019   Procedure: LEFT HEART CATH AND CORONARY ANGIOGRAPHY;  Surgeon: ENelva Bush MD;  [facility];  Service: Cardiovascular;  Laterality: N/A;  . RIGHT/LEFT HEART CATH AND CORONARY ANGIOGRAPHY N/A 08/26/2018   Procedure: RIGHT/LEFT HEART  CATH AND CORONARY ANGIOGRAPHY;  Surgeon: Wellington Hampshire, MD;  Location: [place] [facility];  Service: Cardiovascular;  Laterality: N/A;  . SHOULDER SURGERY     15 + yrs ago   . SKIN SURGERY     nose   . VAGINAL HYSTERECTOMY       A IV Location/Drains/Wounds Patient Lines/Drains/Airways Status   Active Line/Drains/Airways    Name:   Placement date:   Placement time:   Site:   Days:   Peripheral IV 08/04/19 Left Antecubital   08/04/19    2129    Antecubital   1   Peripheral IV 08/04/19 Left;Medial Forearm   08/04/19    2243    Forearm   1   Peripheral IV 08/05/19 Right Forearm   08/05/19    0116    Forearm   less than 1   Peripheral  IV 08/05/19 Right Hand   08/05/19    0116    Hand   less than 1   Urethral Catheter Brittney Sparks, NTII Non-latex 16 Fr.   08/04/19    2249    Non-latex   1          Intake/Output Last 24 hours  Intake/Output Summary (Last 24 hours) at 08/05/2019 0549 Last data filed at 08/05/2019 0424 Gross per 24 hour  Intake 1100 ml  Output 2000 ml  Net -900 ml    Labs/Imaging Results for orders placed or performed during the hospital encounter of 08/04/19 (from the past 48 hour(s))  Comprehensive metabolic panel     Status: Abnormal   Collection Time: 08/04/19  8:57 PM  Result Value Ref Range   Sodium 137 135 - 145 mmol/L    Comment: LYTES REPEATED / JAG   Potassium 5.1 3.5 - 5.1 mmol/L   Chloride 90 (L) 98 - 111 mmol/L   CO2 21 (L) 22 - 32 mmol/L   Glucose, Bld 1,324 (HH) 70 - 99 mg/dL    Comment: RESULT CONFIRMED BY MANUAL DILUTION / JAG CRITICAL RESULT CALLED TO, READ BACK BY AND VERIFIED WITH AMY COYNE ON 08/04/19 AT 2204 BY JAG    BUN 72 (H) 8 - 23 mg/dL   Creatinine, Ser 2.65 (H) 0.44 - 1.00 mg/dL   Calcium 10.8 (H) 8.9 - 10.3 mg/dL   Total Protein 8.7 (H) 6.5 - 8.1 g/dL   Albumin 3.8 3.5 - 5.0 g/dL   AST 22 15 - 41 U/L   ALT 19 0 - 44 U/L   Alkaline Phosphatase 151 (H) 38 - 126 U/L   Total Bilirubin 1.1 0.3 - 1.2 mg/dL   GFR calc non Af Amer 18 (L) >60 mL/min   GFR calc Af Amer 21 (L) >60 mL/min   Anion gap 26 (H) 5 - 15    Comment: Performed at [facility], [address] [place], [place] [address]  CBC     Status: Abnormal   Collection Time: 08/04/19  8:57 PM  Result Value Ref Range   WBC 28.9 (H) 4.0 - 10.5 K/uL   RBC 5.49 (H) 3.87 - 5.11 MIL/uL   Hemoglobin 15.8 (H) 12.0 - 15.0 g/dL   HCT 53.7 (H) 36.0 - 46.0 %   MCV 97.8 80.0 - 100.0 fL   MCH 28.8 26.0 - 34.0 pg   MCHC 29.4 (L) 30.0 - 36.0 g/dL   RDW 13.7 11.5 - 15.5 %   Platelets 364 150 - 400 K/uL   nRBC 0.0 0.0 - 0.2 %  Comment: Performed at [facility], [address]  [place], [place] [address]  Type and screen [place]     Status: None (Preliminary result)   Collection Time: 08/04/19  8:57 PM  Result Value Ref Range   ABO/RH(D) PENDING    Antibody Screen PENDING    Sample Expiration      08/07/2019,2359 Performed at [facility], [address] [place], [place] [address]   APTT     Status: None   Collection Time: 08/04/19  8:57 PM  Result Value Ref Range   aPTT 29 24 - 36 seconds    Comment: Performed at [facility], [address] [place], [place] [address]  Protime-INR     Status: Abnormal   Collection Time: 08/04/19  8:57 PM  Result Value Ref Range   Prothrombin Time 15.7 (H) 11.4 - 15.2 seconds   INR 1.3 (H) 0.8 - 1.2    Comment: (NOTE) INR goal varies based on device and disease states. Performed at [facility], [address] [place], [place] [address]   Urinalysis, Complete w Microscopic     Status: Abnormal   Collection Time: 08/04/19  8:57 PM  Result Value Ref Range   Color, Urine YELLOW (A) YELLOW   APPearance CLOUDY (A) CLEAR   Specific Gravity, Urine 1.021 1.005 - 1.030   pH 6.0 5.0 - 8.0   Glucose, UA >=500 (A) NEGATIVE mg/dL   Hgb urine dipstick SMALL (A) NEGATIVE   Bilirubin Urine NEGATIVE NEGATIVE   Ketones, ur 5 (A) NEGATIVE mg/dL   Protein, ur 30 (A) NEGATIVE mg/dL   Nitrite NEGATIVE NEGATIVE   Leukocytes,Ua MODERATE (A) NEGATIVE   RBC / HPF 0-5 0 - 5 RBC/hpf   WBC, UA >50 (H) 0 - 5 WBC/hpf   Bacteria, UA RARE (A) NONE SEEN   Squamous Epithelial / LPF NONE SEEN 0 - 5   WBC Clumps PRESENT    Budding Yeast PRESENT     Comment: Performed at [facility], [address] [place], [place] [address]  ABO/Rh     Status: None   Collection Time: 08/04/19  8:57 PM  Result Value Ref Range   ABO/RH(D)      Jenetta Downer NEG Performed at [facility], [place], [place] [address]   SARS Coronavirus 2 by RT PCR (hospital order, performed in [place] hospital lab) Nasopharyngeal Nasopharyngeal Swab     Status: None   Collection Time: 08/04/19  8:57 PM   Specimen: Nasopharyngeal Swab  Result Value Ref Range   SARS Coronavirus 2 NEGATIVE NEGATIVE    Comment: (NOTE) If result is NEGATIVE SARS-CoV-2 target nucleic acids are NOT DETECTED. The SARS-CoV-2 RNA is generally detectable in upper and lower  respiratory specimens during the acute phase of infection. The lowest  concentration of SARS-CoV-2 viral copies this assay can detect is 250  copies / mL. A negative result does not preclude SARS-CoV-2 infection  and should not be used as the sole basis for treatment or other  patient management decisions.  A negative result may occur with  improper specimen collection / handling, submission of specimen other  than nasopharyngeal swab, presence of viral mutation(s) within the  areas targeted by this assay, and inadequate number of viral copies  (<250 copies / mL). A negative result must be combined with clinical  observations, patient history, and epidemiological information. If result is POSITIVE SARS-CoV-2 target nucleic acids are DETECTED. The  SARS-CoV-2 RNA is generally detectable in upper and lower  respiratory specimens dur ing the acute phase of infection.  Positive  results are indicative of active infection with SARS-CoV-2.  Clinical  correlation with patient history and other diagnostic information is  necessary to determine patient infection status.  Positive results do  not rule out bacterial infection or co-infection with other viruses. If result is PRESUMPTIVE POSTIVE SARS-CoV-2 nucleic acids MAY BE PRESENT.   A presumptive positive result was obtained on the submitted specimen  and confirmed on repeat testing.  While 2019 novel coronavirus  (SARS-CoV-2) nucleic acids may be present in the submitted sample  additional confirmatory testing may be necessary for epidemiological  and / or clinical management purposes  to  differentiate between  SARS-CoV-2 and other Sarbecovirus currently known to infect humans.  If clinically indicated additional testing with an alternate test  methodology [phone]) is advised. The SARS-CoV-2 RNA is generally  detectable in upper and lower respiratory sp ecimens during the acute  phase of infection. The expected result is Negative. Fact Sheet for Patients:  StrictlyIdeas.no Fact Sheet for Healthcare Providers: BankingDealers.co.za This test is not yet approved or cleared by the [place] FDA and has been authorized for detection and/or diagnosis of SARS-CoV-2 by FDA under an Emergency Use Authorization (EUA).  This EUA will remain in effect (meaning this test can be used) for the duration of the COVID-19 declaration under Section 564(b)(1) of the Act, 21 U.S.C. section 360bbb-3(b)(1), unless the authorization is terminated or revoked sooner. Performed at [facility], [address] [place], [place] [address]   Prepare RBC     Status: None   Collection Time: 08/04/19  9:11 PM  Result Value Ref Range   Order Confirmation      ORDER PROCESSED BY BLOOD BANK Performed at [facility], [address] [place], [place] [address]   Glucose, capillary     Status: Abnormal   Collection Time: 08/04/19 10:49 PM  Result Value Ref Range   Glucose-Capillary >600 (HH) 70 - 99 mg/dL  Glucose, capillary     Status: Abnormal   Collection Time: 08/04/19 11:54 PM  Result Value Ref Range   Glucose-Capillary >600 (HH) 70 - 99 mg/dL   Comment 1 Notify RN    Comment 2 Document in Chart   Glucose, capillary     Status: Abnormal   Collection Time: 08/05/19 12:02 AM  Result Value Ref Range   Glucose-Capillary >600 (HH) 70 - 99 mg/dL  Blood culture (routine x 2)     Status: None (Preliminary result)   Collection Time: 08/05/19 12:56 AM   Specimen: BLOOD  Result Value Ref Range   Specimen Description BLOOD BLOOD  LEFT HAND    Special Requests      BOTTLES DRAWN AEROBIC AND ANAEROBIC Blood Culture adequate volume   Culture      NO GROWTH < 12 HOURS Performed at [facility], [address] [place], [place] [address]    Report Status PENDING   Type and screen Ordered by PROVIDER DEFAULT     Status: None (Preliminary result)   Collection Time: 08/05/19 12:57 AM  Result Value Ref Range   ABO/RH(D) PENDING    Antibody Screen PENDING    Sample Expiration      08/08/2019,2359 Performed at [facility], [address], [place], [place] [address]   CBC with Differential     Status: Abnormal   Collection Time: 08/05/19 12:57 AM  Result  Value Ref Range   WBC 26.7 (H) 4.0 - 10.5 K/uL   RBC 5.76 (H) 3.87 - 5.11 MIL/uL   Hemoglobin 16.8 (H) 12.0 - 15.0 g/dL   HCT 56.1 (H) 36.0 - 46.0 %   MCV 97.4 80.0 - 100.0 fL   MCH 29.2 26.0 - 34.0 pg   MCHC 29.9 (L) 30.0 - 36.0 g/dL   RDW 14.0 11.5 - 15.5 %   Platelets 318 150 - 400 K/uL   nRBC 0.0 0.0 - 0.2 %   Neutrophils Relative % 86 %   Neutro Abs 23.1 (H) 1.7 - 7.7 K/uL   Lymphocytes Relative 9 %   Lymphs Abs 2.3 0.7 - 4.0 K/uL   Monocytes Relative 4 %   Monocytes Absolute 1.0 0.1 - 1.0 K/uL   Eosinophils Relative 0 %   Eosinophils Absolute 0.0 0.0 - 0.5 K/uL   Basophils Relative 0 %   Basophils Absolute 0.1 0.0 - 0.1 K/uL   WBC Morphology TOXIC GRANULATION    RBC Morphology MORPHOLOGY UNREMARKABLE    Smear Review Normal platelet morphology    Immature Granulocytes 1 %   Abs Immature Granulocytes 0.25 (H) 0.00 - 0.07 K/uL    Comment: Performed at [facility], [address] [place], [place] [address]  Blood culture (routine x 2)     Status: None (Preliminary result)   Collection Time: 08/05/19 12:58 AM   Specimen: BLOOD  Result Value Ref Range   Specimen Description BLOOD BLOOD RIGHT FOREARM    Special Requests      BOTTLES DRAWN AEROBIC AND ANAEROBIC Blood Culture results may not be optimal due to an inadequate  volume of blood received in culture bottles   Culture      NO GROWTH < 12 HOURS Performed at [facility], [address] [place], [place] [address]    Report Status PENDING   Glucose, capillary     Status: Abnormal   Collection Time: 08/05/19  1:06 AM  Result Value Ref Range   Glucose-Capillary >600 (HH) 70 - 99 mg/dL  Basic metabolic panel     Status: Abnormal   Collection Time: 08/05/19  1:25 AM  Result Value Ref Range   Sodium 142 135 - 145 mmol/L   Potassium 4.0 3.5 - 5.1 mmol/L   Chloride 100 98 - 111 mmol/L   CO2 23 22 - 32 mmol/L   Glucose, Bld 1,166 (HH) 70 - 99 mg/dL    Comment: CRITICAL RESULT CALLED TO, READ BACK BY AND VERIFIED WITH AMY COYNE ON 08/05/19 AT 0216 BY JAG    BUN 75 (H) 8 - 23 mg/dL   Creatinine, Ser 2.56 (H) 0.44 - 1.00 mg/dL   Calcium 10.4 (H) 8.9 - 10.3 mg/dL   GFR calc non Af Amer 19 (L) >60 mL/min   GFR calc Af Amer 22 (L) >60 mL/min   Anion gap 19 (H) 5 - 15    Comment: Performed at [facility], [address] [place], [place] [address]  Type and screen Ordered by PROVIDER DEFAULT     Status: None (Preliminary result)   Collection Time: 08/05/19  1:25 AM  Result Value Ref Range   ABO/RH(D) O NEG    Antibody Screen NEG    Sample Expiration 08/08/2019,2359    Unit Number [id number]    Blood Component Type RED CELLS,LR    Unit division 00    Status of Unit ISSUED    Transfusion Status OK TO TRANSFUSE  Crossmatch Result COMPATIBLE    Unit Number [id number]    Blood Component Type RED CELLS,LR    Unit division 00    Status of Unit REL FROM [facility]    Transfusion Status OK TO TRANSFUSE    Crossmatch Result      COMPATIBLE Performed at [facility], [place], [place] [address]   Lactic acid, plasma     Status: Abnormal   Collection Time: 08/05/19  1:25 AM  Result Value Ref Range   Lactic Acid, Venous 4.7 (HH) 0.5 - 1.9 mmol/L    Comment: CRITICAL RESULT CALLED TO, READ BACK BY AND  VERIFIED WITH AMY COYNE ON 08/05/19 AT 0216 BY JAG Performed at [facility], [address] [place], [place] [address]   Protime-INR     Status: Abnormal   Collection Time: 08/05/19  1:25 AM  Result Value Ref Range   Prothrombin Time 15.9 (H) 11.4 - 15.2 seconds   INR 1.3 (H) 0.8 - 1.2    Comment: (NOTE) INR goal varies based on device and disease states. Performed at [facility], [address] [place], [place] [address]   APTT     Status: None   Collection Time: 08/05/19  1:25 AM  Result Value Ref Range   aPTT 28 24 - 36 seconds    Comment: Performed at [facility], [address] [place], [place] [address]  Glucose, capillary     Status: Abnormal   Collection Time: 08/05/19  3:35 AM  Result Value Ref Range   Glucose-Capillary >600 (HH) 70 - 99 mg/dL  Lactic acid, plasma     Status: Abnormal   Collection Time: 08/05/19  4:04 AM  Result Value Ref Range   Lactic Acid, Venous 4.7 (HH) 0.5 - 1.9 mmol/L    Comment: CRITICAL VALUE NOTED. VALUE IS CONSISTENT WITH PREVIOUSLY REPORTED/CALLED VALUE RWW Performed at [facility], [address] [place], [place] [address]   Glucose, capillary     Status: Abnormal   Collection Time: 08/05/19  4:39 AM  Result Value Ref Range   Glucose-Capillary 596 (HH) 70 - 99 mg/dL   Comment 1 Notify RN   Glucose, capillary     Status: Abnormal   Collection Time: 08/05/19  5:48 AM  Result Value Ref Range   Glucose-Capillary 479 (H) 70 - 99 mg/dL   Dg Chest Portable 1 View  Result Date: 08/04/2019 CLINICAL DATA:  Altered mental status. EXAM: PORTABLE CHEST 1 VIEW COMPARISON:  04/23/2011 FINDINGS: Heart and mediastinal contours are within normal limits. No focal opacities or effusions. No acute bony abnormality. IMPRESSION: No active disease. Electronically Signed   By: Rolm Baptise M.D.   On: 08/04/2019 22:27    Pending Labs Unresulted Labs (From admission, onward)    Start     Ordered   08/05/19  0500  Vancomycin, random  Once-Timed,   STAT     08/05/19 0026   08/05/19 9528  Basic metabolic panel  STAT Now then every 4 hours ,   STAT     08/04/19 2313          Vitals/Pain Today's Vitals   08/05/19 0400 08/05/19 0430 08/05/19 0500 08/05/19 0530  BP: (!) 101/59 (!) 95/50 (!) 115/53 (!) 106/55  Pulse: (!) 120 (!) 110 (!) 109 (!) 102  Resp:  (!) 21 20 (!) 21  Temp:      TempSrc:      SpO2: 96% 92% 95% 96%  Weight:      Height:      PainSc:        Isolation Precautions No active isolations  Medications Medications  pantoprazole (PROTONIX) 80 mg in sodium chloride 0.9 % 250 mL (0.32 mg/mL) infusion (8 mg/hr Intravenous New Bag/Given 08/05/19 0110)  pantoprazole (PROTONIX) injection 40 mg (has no administration in time range)  dextrose 5 %-0.45 % sodium chloride infusion (has no administration in time range)  ivabradine (CORLANOR) tablet 5 mg (has no administration in time range)  FLUoxetine (PROZAC) capsule 20 mg (has no administration in time range)  0.9 %  sodium chloride infusion ( Intravenous Not Given 08/05/19 0402)  0.9 %  sodium chloride infusion ( Intravenous New Bag/Given 08/05/19 0350)  dextrose 5 %-0.45 % sodium chloride infusion (has no administration in time range)  insulin regular, human (MYXREDLIN) 100 units/ 100 mL infusion (12.6 Units/hr Intravenous Rate/Dose Change 08/05/19 0548)  ceFEPIme (MAXIPIME) 2 g in sodium chloride 0.9 % 100 mL IVPB (0 g Intravenous Stopped 08/05/19 0158)  pantoprazole (PROTONIX) 80 mg in sodium chloride 0.9 % 100 mL IVPB (0 mg Intravenous Stopped 08/05/19 0404)  sodium chloride 0.9 % bolus 1,000 mL (0 mLs Intravenous Stopped 08/05/19 0235)  sodium chloride 0.9 % bolus 1,000 mL (0 mLs Intravenous Stopped 08/05/19 0338)  vancomycin (VANCOCIN) 1,500 mg in sodium chloride 0.9 % 500 mL IVPB (0 mg Intravenous Stopped 08/05/19 0325)  insulin regular bolus via infusion 10 Units (10 Units Intravenous Bolus from Bag 08/05/19 0330)     Mobility non-ambulatory Moderate fall risk   Focused Assessments GI bleed hyperglycemic   R Recommendations: See Admitting Provider Note  Report given to:   Additional Notes: none

## 2019-08-05 NOTE — ED Notes (Signed)
Pt not fighting with staff  Now.  Labs sent again. Urine cloudy in foley bag.  Sinus tach on monitor.

## 2019-08-05 NOTE — Progress Notes (Signed)
Inpatient Diabetes Program Recommendations  AACE/ADA: New Consensus Statement on Inpatient Glycemic Control   Target Ranges:  Prepandial:   less than 140 mg/dL      Peak postprandial:   less than 180 mg/dL (1-2 hours)      Critically ill patients:  140 - 180 mg/dL   Results for KINLEE, GARRISON (MRN [id number]) as of 08/05/2019 12:42  Ref. Range 08/05/2019 08:25 08/05/2019 09:31 08/05/2019 10:35 08/05/2019 11:32 08/05/2019 12:32  Glucose-Capillary Latest Ref Range: 70 - 99 mg/dL 212 (H) 192 (H) 173 (H) 140 (H) 127 (H)  Results for TOBEY, LIPPARD (MRN [id number]) as of 08/05/2019 12:42  Ref. Range 08/04/2019 20:57 08/05/2019 01:25  Glucose Latest Ref Range: 70 - 99 mg/dL 1,324 [facility]) 1,166 [facility])  Results for ALIVIANA, BURDELL (MRN [id number]) as of 08/05/2019 12:42  Ref. Range 07/08/2019 15:07  Hemoglobin A1C Latest Ref Range: 4.8 - 5.6 % 10.9 (H)   Review of Glycemic Control  Diabetes history: DM2 Outpatient Diabetes medications: Humulin R U500 Insulin per SSI <200- 0 units 201-300- 20 units 300-400- 50 units 401-500- 90 units >500- 100 units Current orders for Inpatient glycemic control: IV insulin  Inpatient Diabetes Program Recommendations:   At time of transition from IV to SQ insulin: Once MD is ready to transition from IV to SQ insulin, please consider ordering Lantus 18 units BID (based on 62 kg x 0.6 units), CBGs Q4H, Novolog 0-20 units Q4H.  Once patient is ordered diet and eating, will likely need to use Humulin R U500 (would discontinue Lantus at that time).   NOTE: Noted patient was recently inpatient 07/08/19 to 07/14/19 and Humulin R U500 was used for glycemic control while inpatient. Inpatient Diabetes Coordinator spoke with patient on 07/09/19 (see note for details) during last hospitalization. Initial glucose 1324 mg/dl on 08/04/19 and  patient started on IV insulin. Since patient is NPO and with metabolic encephalopathy, would recommend using Lantus and Novolog for glycemic control when patient is transitioned off IV insulin. Once patient is alert and eating, will likely need to resume Humulin R U500 (at a lower dose than used as an outpatient). Diabetes Coordinator will follow along while inpatient and make further recommendations if needed based on glycemic control.  Thanks, Barnie Alderman, RN, MSN, CDE Diabetes Coordinator Inpatient Diabetes Program [phone] (Team Pager from 8am to 5pm)

## 2019-08-05 NOTE — Consult Note (Signed)
Name: Kristy [name] MRN: [id number] DOB: [birth year]    ADMISSION DATE:  08/04/2019 CONSULTATION DATE: 08/05/2019  REFERRING MD : Dr. Sidney Ace   CHIEF COMPLAINT: Hematemesis   BRIEF PATIENT DESCRIPTION:  68 yo female admitted with hematemesis, metabolic encephalopathy, sepsis secondary to UTI, and DKA currently on insulin gtt   SIGNIFICANT EVENTS/STUDIES:  11/2: Pt admitted to the stepdown unit requiring insulin gtt   HISTORY OF PRESENT ILLNESS:   This is a 68 yo female with a PMH of uncontrolled type 2 diabetes, DKA (had similar presentation), CAD, CHF, hypertension and left bundle branch block who presented to [facility] ER via EMS on 11/2 with hematemesis.  Per ER notes pts family reported the pt has not been noncompliant with medications, and noticed a decline in pt condition 2 days prior to presentation. Upon arrival to the ER pt lethargic with bloody emesis around her mouth, tachycardic, and hypotensive. Therefore, due to concern of possible GI bleed pt received 1 unit of emergent pRBC's, protonix gtt initiated, and GI consulted.  Lab results revealed CO2 21, glucose 1,324, BUN 72, creatinine 2.65, calcium 10.8, alk phos 151, wbc 28.9, hgb 15.8, PT 15.7, INR 1.3, and lactic acid 4.7.  Labs results ruled pt in for DKA, therefore insulin gtt initiated.  UA positive for UTI.  Pt met sepsis criteria and received 2L NS bolus, vancomycin, and cefepime.  CXR and COVID-19 negative.  She was subsequently admitted to the stepdown unit per hospitalist team for additional workup and treatment.    PAST MEDICAL HISTORY :   has a past medical history of Allergic rhinitis, Allergy, Cataract, Crohn's disease ([place]) (10/13/2013), Diverticulosis, GERD (gastroesophageal reflux disease), Gout, HFrEF (heart failure with reduced ejection fraction) ([place]), Hyperlipidemia, Hypertension, IBS (irritable bowel syndrome), Left bundle branch block, Neuromuscular disorder ([place]), NICM (nonischemic cardiomyopathy) ([place]),  Non-obstructive CAD (coronary artery disease), Osteoarthritis, Poorly controlled Diabetes mellitus, Symptomatic cholelithiasis, and Uncontrolled type 2 diabetes mellitus with stage 3 chronic kidney disease, with long-term current use of insulin ([place]).  has a past surgical history that includes Dilation and curettage of uterus; Vaginal hysterectomy; Skin surgery; Colonoscopy; Shoulder surgery; RIGHT/LEFT HEART CATH AND CORONARY ANGIOGRAPHY (N/A, 08/26/2018); LEFT HEART CATH AND CORONARY ANGIOGRAPHY (N/A, 04/24/2019); Breast cyst aspiration; and Breast biopsy (Right, 06/24/2019). Prior to Admission medications   Medication Sig Start Date End Date Taking? Authorizing Provider  acetaminophen (TYLENOL) 325 MG tablet Take 325-650 mg by mouth daily as needed for moderate pain or headache.    Yes [provider]  aspirin EC 81 MG EC tablet Take 1 tablet (81 mg total) by mouth daily. 04/29/19  Yes Sudini, Alveta Heimlich, MD  carvedilol (COREG) 12.5 MG tablet Take 1 tablet (12.5 mg total) by mouth 2 (two) times daily. 06/11/19 09/09/19 Yes Minna Merritts, MD  clopidogrel (PLAVIX) 75 MG tablet Take 1 tablet (75 mg total) by mouth daily with breakfast. 06/11/19  Yes Gollan, Kathlene November, MD  CORLANOR 5 MG TABS tablet TAKE ONE TABLET TWICE DAILY WITH MEALS Patient taking differently: Take 5 mg by mouth 2 (two) times daily with a meal.  05/12/19  Yes Theora Gianotti, NP  ENTRESTO 49-51 MG TAKE ONE TABLET TWICE DAILY STARTING SUNDAY 09/15/18 Patient taking differently: Take 1 tablet by mouth 2 (two) times daily.  05/05/19  Yes Minna Merritts, MD  FLUoxetine (PROZAC) 20 MG capsule Take 1 capsule (20 mg total) by mouth daily. 02/12/19  Yes Copland, [name], MD  insulin regular human CONCENTRATED (HUMULIN R U-500 KWIKPEN) 500  UNIT/ML kwikpen Inject 50-100 Units into the skin 4 (four) times daily -  before meals and at bedtime. Give 0 units insulin for blood sugar < 200  Give 20 units insulin for blood sugar 201 -  300 Give 50 units insulin for blood sugar 301 - 400 Give 90 units insulin for blood sugar 401 - 500 Give 100 units insulin for blood sugar > 500 . 07/14/19 11/11/19 Yes Gladstone Lighter, MD  spironolactone (ALDACTONE) 25 MG tablet Take 1 tablet (25 mg total) by mouth daily. 08/04/19  Yes Minna Merritts, MD  atorvastatin (LIPITOR) 80 MG tablet Take 1 tablet (80 mg total) by mouth daily at 6 PM. Patient not taking: Reported on 07/31/2019 05/19/19   Minna Merritts, MD  glucose blood (ONETOUCH ULTRA) test strip Use to check blood sugar up to 8 times a day. 06/25/19   Copland, [name], MD  Insulin Syringe-Needle U-100 (B-D INS SYRINGE 0.5CC/31GX5/16) 31G X 5/16" 0.5 ML MISC USE AS DIRECTED THREE TIMES A DAY 08/06/17   Copland, [name], MD   Allergies  Allergen Reactions   Fish Oil Other (See Comments)    Gout   Glimepiride Other (See Comments)    REACTION: hypoglycemia   Guanfacine Hcl Other (See Comments)    REACTION: unspecified   Rosiglitazone Other (See Comments)    CHF    FAMILY HISTORY:  family history includes Hypertension in her father; Kidney failure in her brother and mother. SOCIAL HISTORY:  reports that she quit smoking about 22 years ago. Her smoking use included cigarettes. She has a 22.50 pack-year smoking history. She has never used smokeless tobacco. She reports that she does not drink alcohol or use drugs.  REVIEW OF SYSTEMS:   Unable to assess pt lethargic and nonverbal   SUBJECTIVE:  Unable to assess pt lethargic and nonverbal   VITAL SIGNS: Temp:  [97.9 F (36.6 C)-98.4 F (36.9 C)] 97.9 F (36.6 C) (11/03 0630) Pulse Rate:  [28-127] 110 (11/03 0630) Resp:  [15-24] 19 (11/03 0630) BP: (86-127)/(39-86) 103/59 (11/03 0630) SpO2:  [92 %-100 %] 96 % (11/03 0630) Weight:  [62 kg-72.6 kg] 62 kg (11/03 0630)  PHYSICAL EXAMINATION: General: acutely ill appearing female, NAD  Neuro: lethargic, not following commands, withdraws from stimulation, PERRL HEENT:  supple, no JVD  Cardiovascular: sinus tach, no R/G  Lungs: diminished throughout, even, non labored  Abdomen: hypoactive BS x4, soft, non distended Musculoskeletal: normal bulk and tone, no edema Skin: no rashes or lesions  Recent Labs  Lab 08/04/19 2057 08/05/19 0125 08/05/19 0546  NA 137 142 154*  K 5.1 4.0 3.5  CL 90* 100 119*  CO2 21* 23 24  BUN 72* 75* 66*  CREATININE 2.65* 2.56* 2.09*  GLUCOSE 1,324* 1,166* 525*   Recent Labs  Lab 08/04/19 2057 08/05/19 0057  HGB 15.8* 16.8*  HCT 53.7* 56.1*  WBC 28.9* 26.7*  PLT 364 318   Dg Chest Portable 1 View  Result Date: 08/04/2019 CLINICAL DATA:  Altered mental status. EXAM: PORTABLE CHEST 1 VIEW COMPARISON:  04/23/2011 FINDINGS: Heart and mediastinal contours are within normal limits. No focal opacities or effusions. No acute bony abnormality. IMPRESSION: No active disease. Electronically Signed   By: Rolm Baptise M.D.   On: 08/04/2019 22:27    ASSESSMENT Severe DKA Sepsis secondary to UTI Severe metabolic encephalopathy Lactic acidosis Acute renal failure Hematemesis   PLAN  Hemodynamic monitoring per ICU protocol DKA protocol Continuous telemetry monitoring  Aggressive fluid resuscitation to maintain map >  65 Broad-spectrum antibiotics if MRSA PCR negative discontinue vancomycin Trend procalcitonin and lactic acid Follow-up cultures Monitor and correct electrolytes Trend creatinine and avoid nephrotoxic drugs VTE px: SCD's, avoid chemical prophylaxis  Trend CBC  Monitor for s/sx of bleeding and transfuse for hgb <7 Continue protonix gtt GI consulted appreciate input  Keep NPO for now  Will order CT Head   Marda Stalker, [place] Pager [phone] (please enter 7 digits) PCCM Consult Pager [phone] (please enter 7 digits)

## 2019-08-05 NOTE — ED Notes (Addendum)
Pt with eyes closed, nonverbal but moans with any stimuli; pt repos in bed for comfort and linen changed with warm blanket; yellow fall socks and bracelet placed on pt and mouth clensed of dried blood

## 2019-08-05 NOTE — ED Notes (Signed)
Report off to lisa rn

## 2019-08-05 NOTE — Progress Notes (Signed)
CODE SEPSIS - PHARMACY COMMUNICATION  **Broad Spectrum Antibiotics should be administered within 1 hour of Sepsis diagnosis**  Time Code Sepsis Called/Page Received: 2316  Antibiotics Ordered: vanc/cefepime  Time of 1st antibiotic administration: 0112  Additional action taken by pharmacy:   If necessary, Name of Provider/Nurse Contacted:     Tobie Lords ,PharmD Clinical Pharmacist  08/05/2019  1:22 AM

## 2019-08-06 ENCOUNTER — Telehealth: Payer: Self-pay | Admitting: *Deleted

## 2019-08-06 ENCOUNTER — Inpatient Hospital Stay: Payer: Medicare HMO

## 2019-08-06 ENCOUNTER — Other Ambulatory Visit: Payer: Self-pay

## 2019-08-06 DIAGNOSIS — E1110 Type 2 diabetes mellitus with ketoacidosis without coma: Secondary | ICD-10-CM

## 2019-08-06 LAB — BASIC METABOLIC PANEL
Anion gap: 10 (ref 5–15)
BUN: 40 mg/dL — ABNORMAL HIGH (ref 8–23)
CO2: 24 mmol/L (ref 22–32)
Calcium: 9.1 mg/dL (ref 8.9–10.3)
Chloride: 121 mmol/L — ABNORMAL HIGH (ref 98–111)
Creatinine, Ser: 1.41 mg/dL — ABNORMAL HIGH (ref 0.44–1.00)
GFR calc Af Amer: 44 mL/min — ABNORMAL LOW (ref >60)
GFR calc non Af Amer: 38 mL/min — ABNORMAL LOW (ref >60)
Glucose, Bld: 239 mg/dL — ABNORMAL HIGH (ref 70–99)
Potassium: 3.7 mmol/L (ref 3.5–5.1)
Sodium: 155 mmol/L — ABNORMAL HIGH (ref 135–145)

## 2019-08-06 LAB — CBC WITH DIFFERENTIAL/PLATELET
Abs Immature Granulocytes: 0.27 10*3/uL — ABNORMAL HIGH (ref 0.00–0.07)
Basophils Absolute: 0.1 10*3/uL (ref 0.0–0.1)
Basophils Relative: 0 %
Eosinophils Absolute: 0 10*3/uL (ref 0.0–0.5)
Eosinophils Relative: 0 %
HCT: 47.6 % — ABNORMAL HIGH (ref 36.0–46.0)
Hemoglobin: 15.5 g/dL — ABNORMAL HIGH (ref 12.0–15.0)
Immature Granulocytes: 1 %
Lymphocytes Relative: 19 %
Lymphs Abs: 4.5 10*3/uL — ABNORMAL HIGH (ref 0.7–4.0)
MCH: 29.1 pg (ref 26.0–34.0)
MCHC: 32.6 g/dL (ref 30.0–36.0)
MCV: 89.5 fL (ref 80.0–100.0)
Monocytes Absolute: 1.1 10*3/uL — ABNORMAL HIGH (ref 0.1–1.0)
Monocytes Relative: 5 %
Neutro Abs: 17.6 10*3/uL — ABNORMAL HIGH (ref 1.7–7.7)
Neutrophils Relative %: 75 %
Platelets: 180 10*3/uL (ref 150–400)
RBC: 5.32 MIL/uL — ABNORMAL HIGH (ref 3.87–5.11)
RDW: 13.4 % (ref 11.5–15.5)
WBC: 23.5 10*3/uL — ABNORMAL HIGH (ref 4.0–10.5)
nRBC: 0 % (ref 0.0–0.2)

## 2019-08-06 LAB — MAGNESIUM: Magnesium: 1.7 mg/dL (ref 1.7–2.4)

## 2019-08-06 LAB — URINE CULTURE: Culture: NO GROWTH

## 2019-08-06 LAB — PROCALCITONIN
Procalcitonin: 0.29 ng/mL
Procalcitonin: 0.2 ng/mL

## 2019-08-06 LAB — GLUCOSE, CAPILLARY
Glucose-Capillary: 238 mg/dL — ABNORMAL HIGH (ref 70–99)
Glucose-Capillary: 209 mg/dL — ABNORMAL HIGH (ref 70–99)
Glucose-Capillary: 204 mg/dL — ABNORMAL HIGH (ref 70–99)
Glucose-Capillary: 173 mg/dL — ABNORMAL HIGH (ref 70–99)
Glucose-Capillary: 212 mg/dL — ABNORMAL HIGH (ref 70–99)

## 2019-08-06 LAB — PREPARE RBC (CROSSMATCH)

## 2019-08-06 MED ORDER — PNEUMOCOCCAL VAC POLYVALENT 25 MCG/0.5ML IJ INJ
0.5000 mL | INJECTION | INTRAMUSCULAR | Status: DC
  Filled 2019-08-06: qty 0.5

## 2019-08-06 MED ORDER — INFLUENZA VAC A&B SA ADJ QUAD 0.5 ML IM PRSY
0.5000 mL | PREFILLED_SYRINGE | INTRAMUSCULAR | Status: DC
  Filled 2019-08-06: qty 0.5

## 2019-08-06 MED ORDER — VANCOMYCIN HCL IN DEXTROSE 1-5 GM/200ML-% IV SOLN
1000.0000 mg | INTRAVENOUS | Status: DC
  Filled 2019-08-06: qty 200

## 2019-08-06 MED ORDER — VANCOMYCIN HCL 1.25 G IV SOLR
1250.0000 mg | Freq: Once | INTRAVENOUS | Status: AC
  Administered 2019-08-06: 1250 mg via INTRAVENOUS
  Filled 2019-08-06: qty 1250

## 2019-08-06 MED ORDER — SODIUM CHLORIDE 0.45 % IV SOLN
INTRAVENOUS | Status: DC
  Administered 2019-08-06 – 2019-08-07 (×2): via INTRAVENOUS

## 2019-08-06 MED ORDER — SODIUM CHLORIDE 0.9 % IV SOLN
2.0000 g | INTRAVENOUS | Status: DC
  Administered 2019-08-06 – 2019-08-07 (×3): 2 g via INTRAVENOUS
  Filled 2019-08-06: qty 20
  Filled 2019-08-06 (×3): qty 2

## 2019-08-06 MED ORDER — VANCOMYCIN HCL 10 G IV SOLR
1250.0000 mg | Freq: Once | INTRAVENOUS | Status: DC
  Filled 2019-08-06: qty 1250

## 2019-08-06 MED ORDER — MAGNESIUM SULFATE 2 GM/50ML IV SOLN
2.0000 g | Freq: Once | INTRAVENOUS | Status: AC
  Administered 2019-08-06: 2 g via INTRAVENOUS
  Filled 2019-08-06: qty 50

## 2019-08-06 NOTE — Progress Notes (Signed)
PHARMACY - PHYSICIAN COMMUNICATION CRITICAL VALUE ALERT - BLOOD CULTURE IDENTIFICATION (BCID)  Kristy [name] is an 68 y.o. female who presented to [facility] on 08/04/2019 with a chief complaint of hematesis w/ hyperglycemia w/ 3/4 SIRS  Assessment:  Tachycardia, WBC 28.1, PCT 0.41, 1/4 GPC no BCID   Name of physician (or Provider) Contacted: Marda Stalker  Current antibiotics: Ceftriaxone  Changes to prescribed antibiotics recommended:  Recommendations accepted by provider -- since no BCID or cx/sx will increase ceftriaxone 2g IV daily since suspected sepsis s/t UTI  No results found for this or any previous visit.  Tobie Lords, PharmD, BCPS Clinical Pharmacist 08/06/2019  12:52 AM

## 2019-08-06 NOTE — Telephone Encounter (Signed)
Received from [employer]:  Humulin R U-500 x 18 pens.  Lot [id number] C Exp: 11/2021.  Kristy [name] is currently in the hospital.  Will notify patient insulin is here at the office for pick up once patient has been discharged home.

## 2019-08-06 NOTE — NC FL2 (Signed)
[place] LEVEL OF CARE SCREENING TOOL     IDENTIFICATION  Patient Name: Kristy [name] Birthdate: 08/11/1951 Sex: female Admission Date (Current Location): 08/04/2019  [place] and [place] Number:  Engineering geologist and Address:  [facility], [address], [place], [place] [address]      Provider Number: [id number]  Attending Physician Name and Address:  Edwin Dada, *  Relative Name and Phone Number:       Current Level of Care: Hospital Recommended Level of Care: [place] Prior Approval Number:    Date Approved/Denied:   PASRR Number: [id number]  Discharge Plan: SNF    Current Diagnoses: Patient Active Problem List   Diagnosis Date Noted  . Hypotension   . Hypernatremia   . DKA (diabetic ketoacidoses) ([place]) 04/19/2019  . NICM (nonischemic cardiomyopathy) ([place]) 11/08/2018  . Acute on chronic HFrEF (heart failure with reduced ejection fraction) ([place]) 08/22/2018  . Coronary artery disease, non-occlusive 09/13/2016  . Primary osteoarthritis involving multiple joints 03/14/2015  . Personal history of other malignant neoplasm of skin 12/01/2014  . Crohn's disease ([place]) 10/13/2013  . Major depressive disorder, recurrent, in full remission ([place]) 05/01/2011  . TRANSAMINASES, SERUM, ELEVATED 05/01/2010  . PSORIASIS 08/02/2009  . Hyperlipidemia 05/11/2009  . GOUT 02/05/2009  . Uncontrolled type 2 diabetes mellitus with stage 3 chronic kidney disease, with long-term current use of insulin ([place])   . Essential hypertension 04/18/2007  . LBBB (left bundle branch block) 04/18/2007  . ALLERGIC RHINITIS 04/18/2007    Orientation RESPIRATION BLADDER Height & Weight     Self  Normal External catheter Weight: 136 lb 11 oz (62 kg) Height:  5' 3"  (160 cm)  BEHAVIORAL SYMPTOMS/MOOD NEUROLOGICAL BOWEL NUTRITION STATUS  (none) (none) Continent Diet(Carb Modified)  AMBULATORY STATUS COMMUNICATION OF NEEDS Skin    Extensive Assist Verbally Normal                       Personal Care Assistance Level of Assistance  Bathing, Feeding, Dressing Bathing Assistance: Limited assistance Feeding assistance: Independent Dressing Assistance: Limited assistance     Functional Limitations Info  Sight, Hearing, Speech Sight Info: Adequate Hearing Info: Adequate Speech Info: Adequate    SPECIAL CARE FACTORS FREQUENCY  PT (By licensed PT), OT (By licensed OT)     PT Frequency: 5 OT Frequency: 5            Contractures Contractures Info: Present    Additional Factors Info  Code Status, Allergies Code Status Info: Full Code Allergies Info: Fish oil, Glimepiride, guanfacine hcl, rosiglitazone           Current Medications (08/06/2019):  This is the current hospital active medication list Current Facility-Administered Medications  Medication Dose Route Frequency Provider Last Rate Last Dose  . 0.45 % sodium chloride infusion   Intravenous Continuous Edwin Dada, MD 100 mL/hr at 08/06/19 1410    . cefTRIAXone (ROCEPHIN) 2 g in sodium chloride 0.9 % 100 mL IVPB  2 g Intravenous Q24H Awilda Bill, NP   Stopped at 08/06/19 0404  . Chlorhexidine Gluconate Cloth 2 % PADS 6 each  6 each Topical Daily Jennye Boroughs, MD      . FLUoxetine (PROZAC) capsule 20 mg  20 mg Oral Daily Mansy, Jan A, MD      . insulin aspart (novoLOG) injection 0-20 Units  0-20 Units Subcutaneous Q4H Charlett Nose, RPH   7 Units at 08/06/19 1206  .  insulin glargine (LANTUS) injection 18 Units  18 Units Subcutaneous BID Charlett Nose, RPH   18 Units at 08/06/19 1016  . ivabradine (CORLANOR) tablet 5 mg  5 mg Oral BID WC Mansy, Jan A, MD      . morphine 2 MG/ML injection 1 mg  1 mg Intravenous Q3H PRN Ottie Glazier, MD   1 mg at 08/06/19 1049  . [START ON 08/08/2019] pantoprazole (PROTONIX) injection 40 mg  40 mg Intravenous Q12H Nance Pear, MD      . Derrill Memo ON 08/08/2019] vancomycin (VANCOCIN)  IVPB 1000 mg/200 mL premix  1,000 mg Intravenous Q36H Charlett Nose, [facility]         Discharge Medications: Please see discharge summary for a list of discharge medications.  Relevant Imaging Results:  Relevant Lab Results:   Additional Information SSN: [id number]  Annamaria Boots, [place]

## 2019-08-06 NOTE — TOC Initial Note (Signed)
Transition of Care [facility]) - Initial/Assessment Note    Patient Details  Name: Kristy [name] MRN: [id number] Date of Birth: 1951-08-18  Transition of [facility]) CM/SW Contact:    Annamaria Boots, [place] Phone Number: 08/06/2019, 2:45 PM  Clinical Narrative:   CSW requested to be seen by patient's POA. CSW met with patient's POA (niece) at bedside with patient. Niece states that patient continues to come to the hospital and they can not care for her at home. Per niece, patient is non compliant at home and that is why she continues to have hospital admissions. Niece states that she would like patient placed in long term care. CSW explained that long term care is difficult to place patients straight from the hospital but we can try to find placement. CSW also explained that patient would have to start Medicaid application process for long term care. CSW also explained that patient would likely have to go out of county for placement. Niece states understanding of above and agreeable to search for placement. CSW will begin bed search. Patient has not worked with therapy yet but MD is hopeful that patient could go to rehab. CSW will continue to follow for discharge planning.                 Expected Discharge Plan: Skilled Nursing Facility Barriers to Discharge: Continued Medical Work up   Patient Goals and CMS Choice Patient states their goals for this hospitalization and ongoing recovery are:: Niece would like patient placed CMS Medicare.gov Compare Post Acute Care list provided to:: Patient Represenative (must comment)(Niece) Choice offered to / list presented to : [place] / Guardian  Expected Discharge Plan and Services Expected Discharge Plan: [place] Acute Care Choice: [place] Living arrangements for the past 2 months: Single Family Home                                      Prior Living Arrangements/Services Living arrangements for  the past 2 months: Single Family Home Lives with:: Self Patient language and need for interpreter reviewed:: Yes Do you feel safe going back to the place where you live?: Yes      Need for Family Participation in Patient Care: Yes (Comment) Care giver support system in place?: Yes (comment)   Criminal Activity/Legal Involvement Pertinent to Current Situation/Hospitalization: No - Comment as needed  Activities of Daily Living      Permission Sought/Granted Permission sought to share information with : Case Manager, Customer service manager, Family Supports Permission granted to share information with : Yes, Verbal Permission Granted  Share Information with NAME: Niece           Emotional Assessment Appearance:: Appears stated age Attitude/Demeanor/Rapport: Lethargic Affect (typically observed): Restless Orientation: : Oriented to Self Alcohol / Substance Use: Not Applicable Psych Involvement: No (comment)  Admission diagnosis:  AKI (acute kidney injury) ([place]) [N17.9] Hypotension, unspecified hypotension type [I95.9] Diabetic ketoacidosis without coma associated with diabetes mellitus due to underlying condition ([place]) [E08.10] Patient Active Problem List   Diagnosis Date Noted  . Hypotension   . Hypernatremia   . DKA (diabetic ketoacidoses) ([place]) 04/19/2019  . NICM (nonischemic cardiomyopathy) ([place]) 11/08/2018  . Acute on chronic HFrEF (heart failure with reduced ejection fraction) ([place]) 08/22/2018  . Coronary artery disease, non-occlusive 09/13/2016  . Primary osteoarthritis involving multiple joints 03/14/2015  .  Personal history of other malignant neoplasm of skin 12/01/2014  . Crohn's disease ([place]) 10/13/2013  . Major depressive disorder, recurrent, in full remission ([place]) 05/01/2011  . TRANSAMINASES, SERUM, ELEVATED 05/01/2010  . PSORIASIS 08/02/2009  . Hyperlipidemia 05/11/2009  . GOUT 02/05/2009  . Uncontrolled type 2 diabetes mellitus with stage 3 chronic  kidney disease, with long-term current use of insulin ([place])   . Essential hypertension 04/18/2007  . LBBB (left bundle branch block) 04/18/2007  . ALLERGIC RHINITIS 04/18/2007   PCP:  Owens Loffler, MD Pharmacy:   [place], [address] [place] [address] Phone: [phone] Fax: [phone]  [place], [place] [address] [address] Phone: [phone] Fax: [phone]  [facility] [place], [place] - [address] [place] [address] Phone: [phone] Fax: [phone]     Social Determinants of Health ([place]) Interventions    Readmission Risk Interventions Readmission Risk Prevention Plan 04/28/2019 04/27/2019  Post Dischage Appt - Complete  Medication Screening - Complete  Transportation Screening - Complete  PCP or Specialist Appt within 5-7 Days Complete -  Home Care Screening Complete -  Medication Review (RN CM) Complete -  Some recent data might be hidden

## 2019-08-06 NOTE — Progress Notes (Addendum)
PROGRESS NOTE    Kristy [name]  WYO:[id number] DOB: Feb 05, 1951 DOA: 08/04/2019 PCP: Owens Loffler, MD      Brief Narrative:  Kristy [name] is a 68 y.o. F with HFrEF (LVEF 35-40% 04/2019, NICM),  LBBB, HTN, HLD, GERD, Crohn's disease, and poorly controlled DM who presented with decreased mentation, decreased oral intake, confusion, and vomiting what appeared to be coffee-ground.  In the ER, coffee-ground emesis noted patient was tachycardic and hypotensive.  She was started on Protonix and given empiric packed red blood cells.  Further work-up showed glucose 1324, no acidosis. She was started on insulin as well as empiric antibiotics and fluids and admitted.         Assessment & Plan:  HHNK Poorly Controlled DM II UDS negative.  In the absence of significant acidosis or anion gap elevation and in the setting of glucose over 1000, suspect this is just [place].    Glucose is now corrected but she has significant electrolyte derangements -Continue SSI corrections -Continue Lantus twice daily  Hypernatremia Hypomagnesemia -Supplement magnesium -Half-normal saline at 100 cc/h to correct sodium at goal rate of 0.5 mmol/L/h  Acute metabolic encephalopathy Suspect this is primarily at this point from dehydration/hypernatremia/hyperchloremia.  UDS negative. -We will check ammonia, TSH, B12 next, as well as neuraxial imaging  Sepsis  Suspected UTI, R/O Other Etiology  Possible UTI on admit. WBC 14 on admit > 23.5 11/4. BC with 1/2 GPC's.   -We will start empiric vancomycin given positive cultures until the speciation -Continue Rocephin -SDU monitoring  -assess PCT   AKI on CKD 3b Lactic Acidosis  Hypernatremia / Hyperchloremia  -Continue IV fluids -Trend BMP -monitor, replace electrolytes as indicated  -avoid nephrotoxic agents as able   Coffee-Ground Emesis  Screening colonoscopy in 10/2017 per Dr. Carlean Purl, diverticulosis & 1 rectal polyp noted / otherwise normal. She  was treated initially as GIB with emergent PRBC and PPI gtt. Hgb normal on admit & has remained stable throughout course. Pt on plavix at baseline.  -Appreciate GI evaluation > rec's for no urgent EGD at this time, PPI BID  -Continue PPI twice daily -Stop pantoprazole drip    HFrEF 04/2019 EF 35-40%, LHC with stable appearance of coronary arteries, mild to moderate non-obstructive disease to LAD, LCx, mildly elevated LV filling pressure.  -Continue ivabradine -Hold Aldactone, Entresto given dehydration -Hold Plavix, aspirin, beta-blocker  ABD Pain  KUB unremarkable         DVT prophylaxis: SCD's  Code Status: Full Code  Family Communication: Niece at the bedside    Consultants:   GI   PCCM   Procedures:     Diagnostic Tests:  CT Head 11/3 >> negative for acute process   KUB 11/4 >> negative   Cultures  COVID 11/2 >> negative   BCx2 GPC 1/2 >>   UC 11/3 >>   Antimicrobials:   Cefepime 11/2 >> 11/3   Vanco 11/2 >> 11/3, 11/4 >>   Ceftriaxone 11/3 >>   Subjective: Patient remains altered, sleepy.  No fever, cough, respiratory distress, vomiting.   Objective: Vitals:   08/06/19 1600 08/06/19 1700 08/06/19 1800 08/06/19 1900  BP: (!) 157/84 (!) 152/89  140/86  Pulse: 93  78 (!) 114  Resp: 18 17 12 20   Temp:      TempSrc:      SpO2: 91%  98% 91%  Weight:      Height:        Intake/Output Summary (Last 24 hours) at  08/06/2019 1947 Last data filed at 08/06/2019 1806 Gross per 24 hour  Intake 1473.49 ml  Output 1675 ml  Net -201.51 ml   Filed Weights   08/04/19 2054 08/05/19 0630  Weight: 72.6 kg 62 kg    Examination: General appearance: Elderly adult female, somnolent, stirs to voice, otherwise not interactive.   HEENT: Anicteric, conjunctiva pink, lids and lashes normal. No nasal deformity, discharge, epistaxis.  Lips dry, OP dry, no oral lesions.   Skin: Warm and dry.  No suspicious rashes or lesions. Cardiac: Tachycardic, regular, no  murmurs appreciated.  No LE edema.    Respiratory: Normal respiratory rate and rhythm.  CTAB without rales or wheezes. Abdomen: Abdomen soft.  Diffuse nonfocal TTP, voluntary guarding noted, no rigidity. No ascites, distension, hepatosplenomegaly.   MSK: No deformities or effusions of the large joints of the upper or lower extremities bilaterally. Neuro: Obtunded, somnolent, pink spontaneous movements, grimaces to palpation, but no spontaneous verbalizations and will not answer question of follow commands.  Psych: Unable to assess    Data Reviewed: I have personally reviewed following labs and imaging studies:  CBC: Recent Labs  Lab 08/04/19 2057 08/05/19 0057 08/05/19 0931 08/05/19 1324 08/06/19 0459  WBC 28.9* 26.7*  --  28.1* 23.5*  NEUTROABS  --  23.1*  --  23.1* 17.6*  HGB 15.8* 16.8* 14.9 15.0 15.5*  HCT 53.7* 56.1* 44.9 45.2 47.6*  MCV 97.8 97.4  --  89.5 89.5  PLT 364 318  --  217 350   Basic Metabolic Panel: Recent Labs  Lab 08/05/19 0546 08/05/19 0931 08/05/19 1324 08/05/19 1807 08/06/19 0459  NA 154* 157* 157*   156* 152* 155*  K 3.5 3.5 3.7   3.8 3.7 3.7  CL 119* 124* 124*   124* 121* 121*  CO2 24 21* 22   22 21* 24  GLUCOSE 525* 225* 129*   130* 227* 239*  BUN 66* 60* 56*   53* 47* 40*  CREATININE 2.09* 1.74* 1.64*   1.68* 1.53* 1.41*  CALCIUM 9.0 9.3 9.3   9.3 8.9 9.1  MG  --   --   --   --  1.7   GFR: Estimated Creatinine Clearance: 31.6 mL/min (A) (by C-G formula based on SCr of 1.41 mg/dL (H)). Liver Function Tests: Recent Labs  Lab 08/04/19 2057  AST 22  ALT 19  ALKPHOS 151*  BILITOT 1.1  PROT 8.7*  ALBUMIN 3.8   No results for input(s): LIPASE, AMYLASE in the last 168 hours. No results for input(s): AMMONIA in the last 168 hours. Coagulation Profile: Recent Labs  Lab 08/04/19 2057 08/05/19 0125  INR 1.3* 1.3*   Cardiac Enzymes: No results for input(s): CKTOTAL, CKMB, CKMBINDEX, TROPONINI in the last 168 hours. BNP (last 3  results) No results for input(s): PROBNP in the last 8760 hours. HbA1C: No results for input(s): HGBA1C in the last 72 hours. CBG: Recent Labs  Lab 08/05/19 2356 08/06/19 0352 08/06/19 0745 08/06/19 1133 08/06/19 1631  GLUCAP 173* 238* 209* 204* 173*   Lipid Profile: No results for input(s): CHOL, HDL, LDLCALC, TRIG, CHOLHDL, LDLDIRECT in the last 72 hours. Thyroid Function Tests: No results for input(s): TSH, T4TOTAL, FREET4, T3FREE, THYROIDAB in the last 72 hours. Anemia Panel: No results for input(s): VITAMINB12, FOLATE, FERRITIN, TIBC, IRON, RETICCTPCT in the last 72 hours. Urine analysis:    Component Value Date/Time   COLORURINE YELLOW (A) 08/04/2019 2057   APPEARANCEUR CLOUDY (A) 08/04/2019 2057   LABSPEC 1.021  08/04/2019 2057   PHURINE 6.0 08/04/2019 2057   GLUCOSEU >=500 (A) 08/04/2019 2057   HGBUR SMALL (A) 08/04/2019 2057   HGBUR large 03/22/2010 1533   BILIRUBINUR NEGATIVE 08/04/2019 2057   KETONESUR 5 (A) 08/04/2019 2057   PROTEINUR 30 (A) 08/04/2019 2057   UROBILINOGEN 0.2 03/22/2010 1533   NITRITE NEGATIVE 08/04/2019 2057   LEUKOCYTESUR MODERATE (A) 08/04/2019 2057   Sepsis Labs: @LABRCNTIP (procalcitonin:4,lacticacidven:4)  ) Recent Results (from the past 240 hour(s))  SARS Coronavirus 2 by RT PCR (hospital order, performed in [facility] hospital lab) Nasopharyngeal Nasopharyngeal Swab     Status: None   Collection Time: 08/04/19  8:57 PM   Specimen: Nasopharyngeal Swab  Result Value Ref Range Status   SARS Coronavirus 2 NEGATIVE NEGATIVE Final    Comment: (NOTE) If result is NEGATIVE SARS-CoV-2 target nucleic acids are NOT DETECTED. The SARS-CoV-2 RNA is generally detectable in upper and lower  respiratory specimens during the acute phase of infection. The lowest  concentration of SARS-CoV-2 viral copies this assay can detect is 250  copies / mL. A negative result does not preclude SARS-CoV-2 infection  and should not be used as the sole basis  for treatment or other  patient management decisions.  A negative result may occur with  improper specimen collection / handling, submission of specimen other  than nasopharyngeal swab, presence of viral mutation(s) within the  areas targeted by this assay, and inadequate number of viral copies  (<250 copies / mL). A negative result must be combined with clinical  observations, patient history, and epidemiological information. If result is POSITIVE SARS-CoV-2 target nucleic acids are DETECTED. The SARS-CoV-2 RNA is generally detectable in upper and lower  respiratory specimens dur ing the acute phase of infection.  Positive  results are indicative of active infection with SARS-CoV-2.  Clinical  correlation with patient history and other diagnostic information is  necessary to determine patient infection status.  Positive results do  not rule out bacterial infection or co-infection with other viruses. If result is PRESUMPTIVE POSTIVE SARS-CoV-2 nucleic acids MAY BE PRESENT.   A presumptive positive result was obtained on the submitted specimen  and confirmed on repeat testing.  While 2019 novel coronavirus  (SARS-CoV-2) nucleic acids may be present in the submitted sample  additional confirmatory testing may be necessary for epidemiological  and / or clinical management purposes  to differentiate between  SARS-CoV-2 and other Sarbecovirus currently known to infect humans.  If clinically indicated additional testing with an alternate test  methodology [phone]) is advised. The SARS-CoV-2 RNA is generally  detectable in upper and lower respiratory sp ecimens during the acute  phase of infection. The expected result is Negative. Fact Sheet for Patients:  StrictlyIdeas.no Fact Sheet for Healthcare Providers: BankingDealers.co.za This test is not yet approved or cleared by the [place] FDA and has been authorized for detection and/or  diagnosis of SARS-CoV-2 by FDA under an Emergency Use Authorization (EUA).  This EUA will remain in effect (meaning this test can be used) for the duration of the COVID-19 declaration under Section 564(b)(1) of the Act, 21 U.S.C. section 360bbb-3(b)(1), unless the authorization is terminated or revoked sooner. Performed at [facility], [address] [place], [place] [address]   Blood culture (routine x 2)     Status: None (Preliminary result)   Collection Time: 08/05/19 12:56 AM   Specimen: BLOOD  Result Value Ref Range Status   Specimen Description BLOOD BLOOD LEFT HAND  Final  Special Requests   Final    BOTTLES DRAWN AEROBIC AND ANAEROBIC Blood Culture adequate volume   Culture   Final    NO GROWTH 1 DAY Performed at [facility], [address] [place], [place] [address]    Report Status PENDING  Incomplete  Blood culture (routine x 2)     Status: None (Preliminary result)   Collection Time: 08/05/19 12:58 AM   Specimen: BLOOD  Result Value Ref Range Status   Specimen Description BLOOD BLOOD RIGHT FOREARM  Final   Special Requests   Final    BOTTLES DRAWN AEROBIC AND ANAEROBIC Blood Culture results may not be optimal due to an inadequate volume of blood received in culture bottles   Culture  Setup Time   Final    GRAM POSITIVE COCCI ANAEROBIC BOTTLE ONLY CRITICAL RESULT CALLED TO, READ BACK BY AND VERIFIED WITH: DAVID BESANTI AT [place] 08/06/2019 SNG Performed at [facility], [address] [place], [place] [address]    Culture GRAM POSITIVE COCCI  Final   Report Status PENDING  Incomplete  MRSA PCR Screening     Status: None   Collection Time: 08/05/19 11:56 AM   Specimen: Nasopharyngeal  Result Value Ref Range Status   MRSA by PCR NEGATIVE NEGATIVE Final    Comment:        The GeneXpert MRSA Assay (FDA approved for NASAL specimens only), is one component of a comprehensive MRSA colonization surveillance program. It is  not intended to diagnose MRSA infection nor to guide or monitor treatment for MRSA infections. Performed at [facility], [address], [place], [place] [address]          Radiology Studies: Ct Head Wo Contrast  Result Date: 08/05/2019 CLINICAL DATA:  Altered level of consciousness. Metabolic encephalopathy. Diabetic ketoacidosis. EXAM: CT HEAD WITHOUT CONTRAST TECHNIQUE: Contiguous axial images were obtained from the base of the skull through the vertex without intravenous contrast. COMPARISON:  April 19, 2019 FINDINGS: Brain: Mild diffuse atrophy is stable. There is no intracranial mass, hemorrhage, extra-axial fluid collection, or midline shift. There is mild patchy small vessel disease in the centra semiovale bilaterally, stable. No acute infarct is evident. Vascular: There is no hyperdense vessel. There is no appreciable vascular calcification. Skull: The bony calvarium appears intact. Sinuses/Orbits: There is mucosal thickening in several ethmoid air cells. Other visualized paranasal sinuses are clear. Orbits appear symmetric bilaterally. Other: Mastoid air cells are clear. IMPRESSION: Stable mild atrophy with mild periventricular small vessel disease. No acute infarct evident. No mass or hemorrhage. Mucosal thickening noted in several ethmoid air cells. Electronically Signed   By: Lowella Grip III M.D.   On: 08/05/2019 09:03   Dg Chest Portable 1 View  Result Date: 08/04/2019 CLINICAL DATA:  Altered mental status. EXAM: PORTABLE CHEST 1 VIEW COMPARISON:  04/23/2011 FINDINGS: Heart and mediastinal contours are within normal limits. No focal opacities or effusions. No acute bony abnormality. IMPRESSION: No active disease. Electronically Signed   By: Rolm Baptise M.D.   On: 08/04/2019 22:27   Dg Abd Portable 1v  Result Date: 08/06/2019 CLINICAL DATA:  Abdominal pain. EXAM: PORTABLE ABDOMEN - 1 VIEW COMPARISON:  CT abdomen pelvis dated May 14, 2018. FINDINGS: The bowel  gas pattern is normal. No radio-opaque calculi or other significant radiographic abnormality are seen. No acute osseous abnormality. Old right inferior pubic ramus fracture. IMPRESSION: Negative. Electronically Signed   By: Titus Dubin M.D.   On: 08/06/2019 12:24  Scheduled Meds:  Chlorhexidine Gluconate Cloth  6 each Topical Daily   FLUoxetine  20 mg Oral Daily   [START ON 08/07/2019] influenza vaccine adjuvanted  0.5 mL Intramuscular Tomorrow-1000   insulin aspart  0-20 Units Subcutaneous Q4H   insulin glargine  18 Units Subcutaneous BID   ivabradine  5 mg Oral BID WC   [START ON 08/08/2019] pantoprazole  40 mg Intravenous Q12H   [START ON 08/07/2019] pneumococcal 23 valent vaccine  0.5 mL Intramuscular Tomorrow-1000   Continuous Infusions:  sodium chloride 100 mL/hr at 08/06/19 1806   cefTRIAXone (ROCEPHIN)  IV 200 mL/hr at 08/06/19 1806   [START ON 08/08/2019] vancomycin       LOS: 2 days    Time spent: 25 minutes   Noe Gens, AG-ACNP-[facility] 08/06/2019, 7:47 PM     Please page through [facility]:  [email or web address] Password [email or web address] If 7PM-7AM, please contact night-coverage    Attending MD Note:  I have seen and examined the patient with NP-S and agree with the note above which has been edited to reflect our agreed upon history, exam, and assessment/plan.   I have personally reviewed the orders for the patient, which were made under my direction.     [place]

## 2019-08-06 NOTE — Progress Notes (Signed)
Kristy Antigua, MD [address], [place], [place], [place], [address] [address], [place], [place], [place], [address] Phone: [phone]  Fax: [phone]   Subjective: No episodes of emesis since admission, no signs of active GI bleeding. Pt is awake and alert today and tolerating oral diet without difficulty   Objective: Exam: Vital signs in last 24 hours: Vitals:   08/06/19 0900 08/06/19 1000 08/06/19 1051 08/06/19 1100  BP: (!) 141/85 139/82  134/77  Pulse: (!) 114 (!) 112 (!) 113 (!) 109  Resp: 20 20 (!) 22 15  Temp:      TempSrc:      SpO2: 93% 92% 92% 92%  Weight:      Height:       Weight change:   Intake/Output Summary (Last 24 hours) at 08/06/2019 1208 Last data filed at 08/06/2019 1053 Gross per 24 hour  Intake 1718.51 ml  Output 1025 ml  Net 693.51 ml    General: No acute distress, AAO x3 Abd: Soft, NT/ND, No HSM Skin: Warm, no rashes Neck: Supple, Trachea midline   Lab Results: Lab Results  Component Value Date   WBC 23.5 (H) 08/06/2019   HGB 15.5 (H) 08/06/2019   HCT 47.6 (H) 08/06/2019   MCV 89.5 08/06/2019   PLT 180 08/06/2019   Micro Results: Recent Results (from the past 240 hour(s))  SARS Coronavirus 2 by RT PCR (hospital order, performed in [facility] hospital lab) Nasopharyngeal Nasopharyngeal Swab     Status: None   Collection Time: 08/04/19  8:57 PM   Specimen: Nasopharyngeal Swab  Result Value Ref Range Status   SARS Coronavirus 2 NEGATIVE NEGATIVE Final    Comment: (NOTE) If result is NEGATIVE SARS-CoV-2 target nucleic acids are NOT DETECTED. The SARS-CoV-2 RNA is generally detectable in upper and lower  respiratory specimens during the acute phase of infection. The lowest  concentration of SARS-CoV-2 viral copies this assay can detect is 250  copies / mL. A negative result does not preclude SARS-CoV-2 infection  and should not be used as the sole basis for treatment or other  patient management decisions.  A  negative result may occur with  improper specimen collection / handling, submission of specimen other  than nasopharyngeal swab, presence of viral mutation(s) within the  areas targeted by this assay, and inadequate number of viral copies  (<250 copies / mL). A negative result must be combined with clinical  observations, patient history, and epidemiological information. If result is POSITIVE SARS-CoV-2 target nucleic acids are DETECTED. The SARS-CoV-2 RNA is generally detectable in upper and lower  respiratory specimens dur ing the acute phase of infection.  Positive  results are indicative of active infection with SARS-CoV-2.  Clinical  correlation with patient history and other diagnostic information is  necessary to determine patient infection status.  Positive results do  not rule out bacterial infection or co-infection with other viruses. If result is PRESUMPTIVE POSTIVE SARS-CoV-2 nucleic acids MAY BE PRESENT.   A presumptive positive result was obtained on the submitted specimen  and confirmed on repeat testing.  While 2019 novel coronavirus  (SARS-CoV-2) nucleic acids may be present in the submitted sample  additional confirmatory testing may be necessary for epidemiological  and / or clinical management purposes  to differentiate between  SARS-CoV-2 and other Sarbecovirus currently known to infect humans.  If clinically indicated additional testing with an alternate test  methodology [phone]) is advised. The SARS-CoV-2 RNA is generally  detectable in upper and  lower respiratory sp ecimens during the acute  phase of infection. The expected result is Negative. Fact Sheet for Patients:  StrictlyIdeas.no Fact Sheet for Healthcare Providers: BankingDealers.co.za This test is not yet approved or cleared by the [place] FDA and has been authorized for detection and/or diagnosis of SARS-CoV-2 by FDA under an Emergency Use  Authorization (EUA).  This EUA will remain in effect (meaning this test can be used) for the duration of the COVID-19 declaration under Section 564(b)(1) of the Act, 21 U.S.C. section 360bbb-3(b)(1), unless the authorization is terminated or revoked sooner. Performed at [facility], [address] [place], [place] [address]   Blood culture (routine x 2)     Status: None (Preliminary result)   Collection Time: 08/05/19 12:56 AM   Specimen: BLOOD  Result Value Ref Range Status   Specimen Description BLOOD BLOOD LEFT HAND  Final   Special Requests   Final    BOTTLES DRAWN AEROBIC AND ANAEROBIC Blood Culture adequate volume   Culture   Final    NO GROWTH 1 DAY Performed at [facility], [address], [place], [place] [address]    Report Status PENDING  Incomplete  Blood culture (routine x 2)     Status: None (Preliminary result)   Collection Time: 08/05/19 12:58 AM   Specimen: BLOOD  Result Value Ref Range Status   Specimen Description BLOOD BLOOD RIGHT FOREARM  Final   Special Requests   Final    BOTTLES DRAWN AEROBIC AND ANAEROBIC Blood Culture results may not be optimal due to an inadequate volume of blood received in culture bottles   Culture  Setup Time   Final    GRAM POSITIVE COCCI ANAEROBIC BOTTLE ONLY CRITICAL RESULT CALLED TO, READ BACK BY AND VERIFIED WITH: DAVID BESANTI AT [place] ON 08/06/2019 SNG Performed at [facility], [address] [place], [place] [address]    Culture GRAM POSITIVE COCCI  Final   Report Status PENDING  Incomplete  MRSA PCR Screening     Status: None   Collection Time: 08/05/19 11:56 AM   Specimen: Nasopharyngeal  Result Value Ref Range Status   MRSA by PCR NEGATIVE NEGATIVE Final    Comment:        The GeneXpert MRSA Assay (FDA approved for NASAL specimens only), is one component of a comprehensive MRSA colonization surveillance program. It is not intended to diagnose MRSA infection nor to guide or  monitor treatment for MRSA infections. Performed at [facility], [address] [place], [place] [address]    Studies/Results: Ct Head Wo Contrast  Result Date: 08/05/2019 CLINICAL DATA:  Altered level of consciousness. Metabolic encephalopathy. Diabetic ketoacidosis. EXAM: CT HEAD WITHOUT CONTRAST TECHNIQUE: Contiguous axial images were obtained from the base of the skull through the vertex without intravenous contrast. COMPARISON:  April 19, 2019 FINDINGS: Brain: Mild diffuse atrophy is stable. There is no intracranial mass, hemorrhage, extra-axial fluid collection, or midline shift. There is mild patchy small vessel disease in the centra semiovale bilaterally, stable. No acute infarct is evident. Vascular: There is no hyperdense vessel. There is no appreciable vascular calcification. Skull: The bony calvarium appears intact. Sinuses/Orbits: There is mucosal thickening in several ethmoid air cells. Other visualized paranasal sinuses are clear. Orbits appear symmetric bilaterally. Other: Mastoid air cells are clear. IMPRESSION: Stable mild atrophy with mild periventricular small vessel disease. No acute infarct evident. No mass or hemorrhage. Mucosal thickening noted in several ethmoid air cells. Electronically Signed   By:  Lowella Grip III M.D.   On: 08/05/2019 09:03   Dg Chest Portable 1 View  Result Date: 08/04/2019 CLINICAL DATA:  Altered mental status. EXAM: PORTABLE CHEST 1 VIEW COMPARISON:  04/23/2011 FINDINGS: Heart and mediastinal contours are within normal limits. No focal opacities or effusions. No acute bony abnormality. IMPRESSION: No active disease. Electronically Signed   By: Rolm Baptise M.D.   On: 08/04/2019 22:27   Medications:  Scheduled Meds: . Chlorhexidine Gluconate Cloth  6 each Topical Daily  . FLUoxetine  20 mg Oral Daily  . insulin aspart  0-20 Units Subcutaneous Q4H  . insulin glargine  18 Units Subcutaneous BID  . ivabradine  5 mg Oral BID WC  .  [START ON 08/08/2019] pantoprazole  40 mg Intravenous Q12H   Continuous Infusions: . sodium chloride    . cefTRIAXone (ROCEPHIN)  IV Stopped (08/06/19 0404)  . vancomycin    . [START ON 08/08/2019] vancomycin     PRN Meds:.morphine injection   Assessment: Active Problems:   DKA (diabetic ketoacidoses) (HCC)   Hypotension   Hypernatremia    Plan: Continue treatment for DKA  Recommend work-up for elevated white count  No signs of active GI bleeding to indicate urgent endoscopy at this time  When clinical status improves, and pt medically optimized, endoscopy can be considered as appropriate as an inpatient or outpatient  When I asked pt if she would want an upper endoscopy she does not answer the question. Niece at bedside and states pt has been non compliant with care recently and refusing to take her meds in the past.   Would recommend psych consult to evaluate for any underlying depression and discussing goals of care with patient   Continue PPI twice daily  Continue serial CBCs  Hemoglobin 15 or above is very reassuring   LOS: 2 days   Kristy Antigua, MD 08/06/2019, 12:08 PM

## 2019-08-06 NOTE — Consult Note (Signed)
Pharmacy Antibiotic Note  Kristy [name] is a 68 y.o. female admitted on 08/04/2019 with DKA and hematemesis. Patient is being treated for a UTI. Pharmacy has been consulted for vancomycin dosing.  Patient received a dose of cefepime along with vancomycin bolus upon admission. After UTI was suspected, therapy was narrowed to ceftriaxone. When one of the bottles grew gram+ cocci, coverage was broadened to vancomycin while awaiting susceptibilities.   Plan: - Patient received vancomycin 1213m IV bolus. - Start vancomycin [id number].  Expected AUC: 487.8 Expected Cssmin: 11.5  - Check Scr daily and monitor renal function while on vancomycin.  Height: 5' 3"  (160 cm) Weight: 136 lb 11 oz (62 kg) IBW/kg (Calculated) : 52.4  Temp (24hrs), Avg:98.4 F (36.9 C), Min:98.1 F (36.7 C), Max:98.9 F (37.2 C)  Recent Labs  Lab 08/04/19 2057 08/05/19 0057 08/05/19 0125 08/05/19 0404 08/05/19 0546 08/05/19 0659 08/05/19 0931 08/05/19 1324 08/05/19 1807 08/06/19 0459  WBC 28.9* 26.7*  --   --   --   --   --  28.1*  --  23.5*  CREATININE 2.65*  --  2.56*  --  2.09*  --  1.74* 1.64*  1.68* 1.53* 1.41*  LATICACIDVEN  --   --  4.7* 4.7*  --  3.9*  --   --   --   --   VANCORANDOM  --   --   --   --   --  21  --   --   --   --     Estimated Creatinine Clearance: 31.6 mL/min (A) (by C-G formula based on SCr of 1.41 mg/dL (H)).    Allergies  Allergen Reactions  . Fish Oil Other (See Comments)    Gout  . Glimepiride Other (See Comments)    REACTION: hypoglycemia  . Guanfacine Hcl Other (See Comments)    REACTION: unspecified  . Rosiglitazone Other (See Comments)    CHF    Antimicrobials this admission: Cefepime 11/2 x 1 Ceftriaxone 11/3 >>  Vancomycin 11/3 x 1, 11/4  >>   Microbiology results: 11/3 BCx: gram + cocci 11/3 UCx: pending 11/3 MRSA PCR: NEGATIVE  Dose Adjustments:  - 11/4: CTX 1 mg IV q24h >> CTX 65m67mV q24h  Thank you for allowing pharmacy to be a part of  this patient's care.  ColRaiford SimmondsharmD Candidate 08/06/2019 3:31 PM

## 2019-08-07 ENCOUNTER — Other Ambulatory Visit: Payer: Self-pay | Admitting: Pharmacist

## 2019-08-07 ENCOUNTER — Ambulatory Visit: Payer: Self-pay | Admitting: Pharmacist

## 2019-08-07 LAB — GLUCOSE, CAPILLARY
Glucose-Capillary: 142 mg/dL — ABNORMAL HIGH (ref 70–99)
Glucose-Capillary: 171 mg/dL — ABNORMAL HIGH (ref 70–99)
Glucose-Capillary: 179 mg/dL — ABNORMAL HIGH (ref 70–99)
Glucose-Capillary: 204 mg/dL — ABNORMAL HIGH (ref 70–99)
Glucose-Capillary: 219 mg/dL — ABNORMAL HIGH (ref 70–99)
Glucose-Capillary: 231 mg/dL — ABNORMAL HIGH (ref 70–99)
Glucose-Capillary: 232 mg/dL — ABNORMAL HIGH (ref 70–99)

## 2019-08-07 LAB — BASIC METABOLIC PANEL
Anion gap: 10 (ref 5–15)
BUN: 23 mg/dL (ref 8–23)
CO2: 24 mmol/L (ref 22–32)
Calcium: 8.2 mg/dL — ABNORMAL LOW (ref 8.9–10.3)
Chloride: 112 mmol/L — ABNORMAL HIGH (ref 98–111)
Creatinine, Ser: 0.95 mg/dL (ref 0.44–1.00)
GFR calc Af Amer: >60 mL/min (ref >60)
GFR calc non Af Amer: >60 mL/min (ref >60)
Glucose, Bld: 226 mg/dL — ABNORMAL HIGH (ref 70–99)
Potassium: 2.8 mmol/L — ABNORMAL LOW (ref 3.5–5.1)
Sodium: 146 mmol/L — ABNORMAL HIGH (ref 135–145)

## 2019-08-07 LAB — CBC
HCT: 45.6 % (ref 36.0–46.0)
Hemoglobin: 14.9 g/dL (ref 12.0–15.0)
MCH: 29.1 pg (ref 26.0–34.0)
MCHC: 32.7 g/dL (ref 30.0–36.0)
MCV: 89.1 fL (ref 80.0–100.0)
Platelets: 143 10*3/uL — ABNORMAL LOW (ref 150–400)
RBC: 5.12 MIL/uL — ABNORMAL HIGH (ref 3.87–5.11)
RDW: 13.6 % (ref 11.5–15.5)
WBC: 17.4 10*3/uL — ABNORMAL HIGH (ref 4.0–10.5)
nRBC: 0 % (ref 0.0–0.2)

## 2019-08-07 LAB — AMMONIA: Ammonia: 16 umol/L (ref 9–35)

## 2019-08-07 LAB — VITAMIN B12: Vitamin B-12: 491 pg/mL (ref 180–914)

## 2019-08-07 LAB — MAGNESIUM
Magnesium: 1.6 mg/dL — ABNORMAL LOW (ref 1.7–2.4)
Magnesium: 1.8 mg/dL (ref 1.7–2.4)

## 2019-08-07 LAB — PHOSPHORUS
Phosphorus: 1.2 mg/dL — ABNORMAL LOW (ref 2.5–4.6)
Phosphorus: 1.9 mg/dL — ABNORMAL LOW (ref 2.5–4.6)

## 2019-08-07 LAB — POTASSIUM: Potassium: 3.9 mmol/L (ref 3.5–5.1)

## 2019-08-07 LAB — PROCALCITONIN: Procalcitonin: 0.11 ng/mL

## 2019-08-07 LAB — TSH: TSH: 1.325 u[IU]/mL (ref 0.350–4.500)

## 2019-08-07 MED ORDER — MAGNESIUM SULFATE 2 GM/50ML IV SOLN
2.0000 g | Freq: Once | INTRAVENOUS | Status: AC
  Administered 2019-08-07: 2 g via INTRAVENOUS
  Filled 2019-08-07: qty 50

## 2019-08-07 MED ORDER — CLOPIDOGREL BISULFATE 75 MG PO TABS
75.0000 mg | ORAL_TABLET | Freq: Every day | ORAL | Status: DC
  Administered 2019-08-08 – 2019-08-15 (×8): 75 mg via ORAL
  Filled 2019-08-07 (×8): qty 1

## 2019-08-07 MED ORDER — INSULIN ASPART 100 UNIT/ML ~~LOC~~ SOLN
3.0000 [IU] | Freq: Three times a day (TID) | SUBCUTANEOUS | Status: DC
  Administered 2019-08-07 – 2019-08-11 (×11): 3 [IU] via SUBCUTANEOUS
  Filled 2019-08-07 (×11): qty 1

## 2019-08-07 MED ORDER — ENSURE MAX PROTEIN PO LIQD
11.0000 [oz_av] | Freq: Two times a day (BID) | ORAL | Status: DC
  Administered 2019-08-10 (×2): 11 [oz_av] via ORAL
  Filled 2019-08-07: qty 330

## 2019-08-07 MED ORDER — POTASSIUM CHLORIDE 10 MEQ/100ML IV SOLN
10.0000 meq | INTRAVENOUS | Status: AC
  Administered 2019-08-07 (×6): 10 meq via INTRAVENOUS
  Filled 2019-08-07 (×6): qty 100

## 2019-08-07 MED ORDER — ASPIRIN EC 81 MG PO TBEC
81.0000 mg | DELAYED_RELEASE_TABLET | Freq: Every day | ORAL | Status: DC
  Administered 2019-08-08 – 2019-08-15 (×8): 81 mg via ORAL
  Filled 2019-08-07 (×8): qty 1

## 2019-08-07 MED ORDER — POTASSIUM PHOSPHATES 15 MMOLE/5ML IV SOLN
30.0000 mmol | Freq: Once | INTRAVENOUS | Status: AC
  Administered 2019-08-07: 30 mmol via INTRAVENOUS
  Filled 2019-08-07: qty 10

## 2019-08-07 MED ORDER — SODIUM CHLORIDE 0.45 % IV SOLN
INTRAVENOUS | Status: DC
  Administered 2019-08-07 – 2019-08-08 (×2): via INTRAVENOUS
  Filled 2019-08-07 (×3): qty 1000

## 2019-08-07 MED ORDER — SPIRONOLACTONE 25 MG PO TABS
25.0000 mg | ORAL_TABLET | Freq: Every day | ORAL | Status: DC
  Administered 2019-08-08 – 2019-08-15 (×8): 25 mg via ORAL
  Filled 2019-08-07 (×8): qty 1

## 2019-08-07 MED ORDER — CARVEDILOL 12.5 MG PO TABS
12.5000 mg | ORAL_TABLET | Freq: Two times a day (BID) | ORAL | Status: DC
  Administered 2019-08-07 – 2019-08-15 (×15): 12.5 mg via ORAL
  Filled 2019-08-07 (×17): qty 1

## 2019-08-07 NOTE — Patient Outreach (Signed)
[name]) Care Management  [place] 08/07/2019  Kristy [name] [birth year] [id number]  2nd call attempt scheduled to patient for medication management today however noted patient currently hospitalized with HHNK. Will cancel call today and will f/u with hospital liaison regarding discharge disposition.   Ralene Bathe, PharmD, [place] [phone]

## 2019-08-07 NOTE — Progress Notes (Addendum)
PROGRESS NOTE    Kristy [name]  KYH:[id number] DOB: May 07, 1951 DOA: 08/04/2019 PCP: Kristy Loffler, MD      Brief Narrative:  Kristy [name] is a 67 y.o. F with HFrEF (LVEF 35-40% 04/2019, NICM),  LBBB, HTN, HLD, GERD, Crohn's disease, and poorly controlled DM who presented with decreased mentation, decreased oral intake, confusion, and vomiting what appeared to be coffee-ground material.  In the ER, coffee-ground emesis noted patient was tachycardic and hypotensive.  She was started on Protonix and given empiric PRBC's.  Further work-up showed glucose 1324, no acidosis.  She was started on insulin as well as empiric antibiotics and fluids and admitted.         Assessment & Plan:  Kristy [name] Poorly Controlled DM II UDS negative.  In the absence of significant acidosis or anion gap elevation and in the setting of glucose over 1000, suspect this is likely HHNK.    Glucoses better, low 200s. -Continue Lantus twice daily -Continue sliding scale correction -Add mealtime insulin -carb modified diet    Acute metabolic encephalopathy Suspect this is primarily at this point from electrolyte derangements and dehydration, superimposed on her progressive age-related cognitive impairment.  UDS negative.    Improving 11/5 but still quite confused, needing to mobilize, increase oral intake  -Check ammonia, TSH, B12  -PT/OT efforts    Hypernatremia Hypomagnesemia Hypokalemia  Hypophosphatemia  -monitor electrolytes, replace as indicated - K, Mg, Phos given 11/5  -Continue IVF to 1/2 NS with 66mQ KCL to 722mhr   Sepsis  Suspected UTI, R/O Other Etiology  Possible UTI on admit. WBC 14 on admit > 23.5 11/4. BC with 1/2 coag-neg staph.  PCT negative. -Continue rocephin, empiric antibiotics, day 4 of 5 -Stop vancomycin  -SDU monitoring  -trend PCT   AKI on CKD 3 Lactic Acidosis  Hypernatremia / Hyperchloremia  Creatinine normalized Hyponatremia much improved -Continue IV  fluids -Follow BMP / UOP  -avoid nephrotoxic agents   Coffee-Ground Emesis  Screening colonoscopy in 10/2017 per Kristy [name] & 1 rectal polyp noted / otherwise normal. She was treated initially as GIB with emergent PRBC and PPI gtt. Hgb normal on admit & has remained stable throughout course. Pt on plavix at baseline.  -appreciate GI input > rec's for no urgent EGD at this time, PPI BID  -Continue Protonix twice daily  HFrEF 04/2019 EF 35-40%, LHC with stable appearance of coronary arteries, mild to moderate non-obstructive disease to LAD, LCx, mildly elevated LV filling pressure.  -Continue ivabradine  -Resume spironolactone -Hold Entresto, consider restarting 11/6   -Continue coreg -Resume ASA, plavix   ABD Pain  KUB unremarkable         DVT prophylaxis: SCD's  Code Status: Full Code  Family Communication: Niece by phone    Consultants:   GI   PCCM   Procedures:     Diagnostic Tests:  CT Head 11/3 >> negative for acute process   KUB 11/4 >> negative   Cultures  COVID 11/2 >> negative   BCx2 GPC 1/2 >>   UC 11/3 >> negative   Antimicrobials:   Cefepime 11/2 >> 11/3   Vanco 11/2 >> 11/3, 11/4 >> 11/5  Ceftriaxone 11/3 >>    Subjective: No fever, no acute events overnight, no respiratory distress.  Appetite low, mentation improved but still very sleepy, confused.  Objective: Vitals:   08/07/19 0500 08/07/19 0600 08/07/19 0700 08/07/19 0744  BP: (!) 154/82 (!) 161/75 (!) 158/90 (!) 158/90  Pulse: 88 81  91 95  Resp: 12 15 14 15   Temp:    98.2 F (36.8 C)  TempSrc:    Axillary  SpO2: 90% 95% 93% 95%  Weight:      Height:        Intake/Output Summary (Last 24 hours) at 08/07/2019 0844 Last data filed at 08/07/2019 0600 Gross per 24 hour  Intake 2230.65 ml  Output 2150 ml  Net 80.65 ml   Filed Weights   08/04/19 2054 08/05/19 0630  Weight: 72.6 kg 62 kg    Examination: General appearance: Elderly adult female,  possible but very sleepy, in no obvious distress   HEENT: Anicteric, conjunctiva pink, lids and lashes normal. No nasal deformity, discharge, epistaxis.  Lips moist, teeth normal. OP tacky dry, no oral lesions.  Hearing normal. Skin: Warm and dry.  No suspicious rashes or lesions. Cardiac: RRR, no murmurs appreciated.  JVP normal, no LE edema.    Respiratory: Normal respiratory rate and rhythm.  CTAB without rales or wheezes. Abdomen: Abdomen soft.  No tenderness palpation or guarding. No ascites, distension, hepatosplenomegaly.   MSK: No deformities or effusions of the large joints of the upper or lower extremities bilaterally. Neuro: Awake and arousable, but alertness is decreased.  Oriented to hospital, otherwise limited in her ability to recall what is happened or where she has.  Muscle tone decreased, without fasciculations.  Moves all extremities equally but with global weakness    Psych: Judgment insight appear severely impaired, attention diminished, affect blunted..  General appearance: chronically ill appearing elderly female lying in bed in NAD HEENT: anicteric, conjunctiva pink, no nasal deformity, discharge or bleeding. Lips dry. Skin: warm/dry, no rashes or lesions, bottom of feet covered in dirt / callous  Cardiac: s1s2 rrr, no m/r/g, SR on monitor, radial pulses = bilaterally, +2    Respiratory: even/non-labored, lungs bilaterally clear Abdomen: abdomen soft, bsx4 active, non-tender on palpation on today's exam, no ascites MSK: no acute deformities or effusions  Neuro: awakens to voice, slow to respond but more alert, able to state name and that her "lunch looks good", able to move all extremities to command / non-focal, tongue midline, smile symmetrical, 4/5 global strength / generalized weakness Psych: unable to assess    Data Reviewed: I have personally reviewed following labs and imaging studies:  CBC: Recent Labs  Lab 08/04/19 2057 08/05/19 0057 08/05/19 0931  08/05/19 1324 08/06/19 0459 08/07/19 0433  WBC 28.9* 26.7*  --  28.1* 23.5* 17.4*  NEUTROABS  --  23.1*  --  23.1* 17.6*  --   HGB 15.8* 16.8* 14.9 15.0 15.5* 14.9  HCT 53.7* 56.1* 44.9 45.2 47.6* 45.6  MCV 97.8 97.4  --  89.5 89.5 89.1  PLT 364 318  --  217 180 947*   Basic Metabolic Panel: Recent Labs  Lab 08/05/19 0931 08/05/19 1324 08/05/19 1807 08/06/19 0459 08/07/19 0433  NA 157* 157*  156* 152* 155* 146*  K 3.5 3.7  3.8 3.7 3.7 2.8*  CL 124* 124*  124* 121* 121* 112*  CO2 21* 22  22 21* 24 24  GLUCOSE 225* 129*  130* 227* 239* 226*  BUN 60* 56*  53* 47* 40* 23  CREATININE 1.74* 1.64*  1.68* 1.53* 1.41* 0.95  CALCIUM 9.3 9.3  9.3 8.9 9.1 8.2*  MG  --   --   --  1.7 1.6*  PHOS  --   --   --   --  1.2*   GFR: Estimated Creatinine Clearance:  46.9 mL/min (by C-G formula based on SCr of 0.95 mg/dL). Liver Function Tests: Recent Labs  Lab 08/04/19 2057  AST 22  ALT 19  ALKPHOS 151*  BILITOT 1.1  PROT 8.7*  ALBUMIN 3.8   No results for input(s): LIPASE, AMYLASE in the last 168 hours. No results for input(s): AMMONIA in the last 168 hours. Coagulation Profile: Recent Labs  Lab 08/04/19 2057 08/05/19 0125  INR 1.3* 1.3*   Cardiac Enzymes: No results for input(s): CKTOTAL, CKMB, CKMBINDEX, TROPONINI in the last 168 hours. BNP (last 3 results) No results for input(s): PROBNP in the last 8760 hours. HbA1C: No results for input(s): HGBA1C in the last 72 hours. CBG: Recent Labs  Lab 08/06/19 1631 08/06/19 2033 08/07/19 0000 08/07/19 0344 08/07/19 0729  GLUCAP 173* 212* 171* 219* 142*   Lipid Profile: No results for input(s): CHOL, HDL, LDLCALC, TRIG, CHOLHDL, LDLDIRECT in the last 72 hours. Thyroid Function Tests: No results for input(s): TSH, T4TOTAL, FREET4, T3FREE, THYROIDAB in the last 72 hours. Anemia Panel: No results for input(s): VITAMINB12, FOLATE, FERRITIN, TIBC, IRON, RETICCTPCT in the last 72 hours. Urine analysis:    Component  Value Date/Time   COLORURINE YELLOW (A) 08/04/2019 2057   APPEARANCEUR CLOUDY (A) 08/04/2019 2057   LABSPEC 1.021 08/04/2019 2057   PHURINE 6.0 08/04/2019 2057   GLUCOSEU >=500 (A) 08/04/2019 2057   HGBUR SMALL (A) 08/04/2019 2057   HGBUR large 03/22/2010 1533   BILIRUBINUR NEGATIVE 08/04/2019 2057   KETONESUR 5 (A) 08/04/2019 2057   PROTEINUR 30 (A) 08/04/2019 2057   UROBILINOGEN 0.2 03/22/2010 1533   NITRITE NEGATIVE 08/04/2019 2057   LEUKOCYTESUR MODERATE (A) 08/04/2019 2057   Sepsis Labs: @LABRCNTIP (procalcitonin:4,lacticacidven:4)  ) Recent Results (from the past 240 hour(s))  SARS Coronavirus 2 by RT PCR (hospital order, performed in [place] hospital lab) Nasopharyngeal Nasopharyngeal Swab     Status: None   Collection Time: 08/04/19  8:57 PM   Specimen: Nasopharyngeal Swab  Result Value Ref Range Status   SARS Coronavirus 2 NEGATIVE NEGATIVE Final    Comment: (NOTE) If result is NEGATIVE SARS-CoV-2 target nucleic acids are NOT DETECTED. The SARS-CoV-2 RNA is generally detectable in upper and lower  respiratory specimens during the acute phase of infection. The lowest  concentration of SARS-CoV-2 viral copies this assay can detect is 250  copies / mL. A negative result does not preclude SARS-CoV-2 infection  and should not be used as the sole basis for treatment or other  patient management decisions.  A negative result may occur with  improper specimen collection / handling, submission of specimen other  than nasopharyngeal swab, presence of viral mutation(s) within the  areas targeted by this assay, and inadequate number of viral copies  (<250 copies / mL). A negative result must be combined with clinical  observations, patient history, and epidemiological information. If result is POSITIVE SARS-CoV-2 target nucleic acids are DETECTED. The SARS-CoV-2 RNA is generally detectable in upper and lower  respiratory specimens dur ing the acute phase of infection.   Positive  results are indicative of active infection with SARS-CoV-2.  Clinical  correlation with patient history and other diagnostic information is  necessary to determine patient infection status.  Positive results do  not rule out bacterial infection or co-infection with other viruses. If result is PRESUMPTIVE POSTIVE SARS-CoV-2 nucleic acids MAY BE PRESENT.   A presumptive positive result was obtained on the submitted specimen  and confirmed on repeat testing.  While 2019 novel coronavirus  (  SARS-CoV-2) nucleic acids may be present in the submitted sample  additional confirmatory testing may be necessary for epidemiological  and / or clinical management purposes  to differentiate between  SARS-CoV-2 and other Sarbecovirus currently known to infect humans.  If clinically indicated additional testing with an alternate test  methodology [phone]) is advised. The SARS-CoV-2 RNA is generally  detectable in upper and lower respiratory sp ecimens during the acute  phase of infection. The expected result is Negative. Fact Sheet for Patients:  StrictlyIdeas.no Fact Sheet for Healthcare Providers: BankingDealers.co.za This test is not yet approved or cleared by the [place] FDA and has been authorized for detection and/or diagnosis of SARS-CoV-2 by FDA under an Emergency Use Authorization (EUA).  This EUA will remain in effect (meaning this test can be used) for the duration of the COVID-19 declaration under Section 564(b)(1) of the Act, 21 U.S.C. section 360bbb-3(b)(1), unless the authorization is terminated or revoked sooner. Performed at [facility], [address] [place], [place] [address]   Blood culture (routine x 2)     Status: None (Preliminary result)   Collection Time: 08/05/19 12:56 AM   Specimen: BLOOD  Result Value Ref Range Status   Specimen Description BLOOD BLOOD LEFT HAND  Final   Special Requests   Final     BOTTLES DRAWN AEROBIC AND ANAEROBIC Blood Culture adequate volume   Culture   Final    NO GROWTH 2 DAYS Performed at [facility], [address] [place], [place] [address]    Report Status PENDING  Incomplete  Blood culture (routine x 2)     Status: None (Preliminary result)   Collection Time: 08/05/19 12:58 AM   Specimen: BLOOD  Result Value Ref Range Status   Specimen Description BLOOD BLOOD RIGHT FOREARM  Final   Special Requests   Final    BOTTLES DRAWN AEROBIC AND ANAEROBIC Blood Culture results may not be optimal due to an inadequate volume of blood received in culture bottles   Culture  Setup Time   Final    GRAM POSITIVE COCCI ANAEROBIC BOTTLE ONLY CRITICAL RESULT CALLED TO, READ BACK BY AND VERIFIED WITH: DAVID BESANTI AT [place] ON 08/06/2019 SNG Performed at [facility], [address] [place], [place] [address]    Culture GRAM POSITIVE COCCI  Final   Report Status PENDING  Incomplete  MRSA PCR Screening     Status: None   Collection Time: 08/05/19 11:56 AM   Specimen: Nasopharyngeal  Result Value Ref Range Status   MRSA by PCR NEGATIVE NEGATIVE Final    Comment:        The GeneXpert MRSA Assay (FDA approved for NASAL specimens only), is one component of a comprehensive MRSA colonization surveillance program. It is not intended to diagnose MRSA infection nor to guide or monitor treatment for MRSA infections. Performed at [facility], [address], [place], [place] [address]   Urine Culture     Status: None   Collection Time: 08/05/19  4:10 PM   Specimen: Urine, Random  Result Value Ref Range Status   Specimen Description   Final    URINE, RANDOM Performed at [facility], [address], [place], [place] [address]    Special Requests   Final    NONE Performed at [facility], [address], [place], [place] [address]    Culture   Final    NO GROWTH Performed at [facility], [place]  [address], [place], [place] [address]    Report Status 08/06/2019 FINAL  Final         Radiology Studies: Ct Head Wo Contrast  Result Date: 08/05/2019 CLINICAL DATA:  Altered level of consciousness. Metabolic encephalopathy. Diabetic ketoacidosis. EXAM: CT HEAD WITHOUT CONTRAST TECHNIQUE: Contiguous axial images were obtained from the base of the skull through the vertex without intravenous contrast. COMPARISON:  April 19, 2019 FINDINGS: Brain: Mild diffuse atrophy is stable. There is no intracranial mass, hemorrhage, extra-axial fluid collection, or midline shift. There is mild patchy small vessel disease in the centra semiovale bilaterally, stable. No acute infarct is evident. Vascular: There is no hyperdense vessel. There is no appreciable vascular calcification. Skull: The bony calvarium appears intact. Sinuses/Orbits: There is mucosal thickening in several ethmoid air cells. Other visualized paranasal sinuses are clear. Orbits appear symmetric bilaterally. Other: Mastoid air cells are clear. IMPRESSION: Stable mild atrophy with mild periventricular small vessel disease. No acute infarct evident. No mass or hemorrhage. Mucosal thickening noted in several ethmoid air cells. Electronically Signed   By: Lowella Grip III M.D.   On: 08/05/2019 09:03   Dg Abd Portable 1v  Result Date: 08/06/2019 CLINICAL DATA:  Abdominal pain. EXAM: PORTABLE ABDOMEN - 1 VIEW COMPARISON:  CT abdomen pelvis dated May 14, 2018. FINDINGS: The bowel gas pattern is normal. No radio-opaque calculi or other significant radiographic abnormality are seen. No acute osseous abnormality. Old right inferior pubic ramus fracture. IMPRESSION: Negative. Electronically Signed   By: Titus Dubin M.D.   On: 08/06/2019 12:24        Scheduled Meds: . Chlorhexidine Gluconate Cloth  6 each Topical Daily  . FLUoxetine  20 mg Oral Daily  . influenza vaccine adjuvanted  0.5 mL Intramuscular Tomorrow-1000  . insulin aspart  0-20 Units  Subcutaneous Q4H  . insulin glargine  18 Units Subcutaneous BID  . ivabradine  5 mg Oral BID WC  . [START ON 08/08/2019] pantoprazole  40 mg Intravenous Q12H  . pneumococcal 23 valent vaccine  0.5 mL Intramuscular Tomorrow-1000   Continuous Infusions: . sodium chloride 100 mL/hr at 08/07/19 0600  . cefTRIAXone (ROCEPHIN)  IV 200 mL/hr at 08/06/19 1806  . potassium chloride 10 mEq (08/07/19 0839)  . [START ON 08/08/2019] vancomycin       LOS: 3 days    Time spent: 35 minutes  Noe Gens, AG-ACNP-[facility] 08/07/2019, 8:44 AM     Please page through AMION:  [email or web address] Password [email or web address] If 7PM-7AM, please contact night-coverage     Attending MD Note:  I have seen and examined the patient with NP student and agree with the note above which has been edited to reflect our agreed upon history, exam, and assessment/plan.    I have personally reviewed the orders for the patient, which were made under my direction.        [place]

## 2019-08-07 NOTE — Progress Notes (Signed)
PT Cancellation Note  Patient Details Name: Kristy [name] MRN: [id number] DOB: 1951/01/23   Cancelled Treatment:    Reason Eval/Treat Not Completed: Medical issues which prohibited therapy; Pt's Ka currently 2.8 and trending down falling outside guidelines for participation with PT services.  Will attempt to see pt tomorrow as medically appropriate.    Linus Salmons PT, DPT 08/07/19, 4:46 PM

## 2019-08-07 NOTE — Progress Notes (Addendum)
Initial Nutrition Assessment  DOCUMENTATION CODES:   Not applicable  INTERVENTION:  Provide Ensure Max Protein po BID, each supplement provides 150 kcal and 30 grams of protein.  NUTRITION DIAGNOSIS:   Inadequate oral intake related to decreased appetite, lethargy/confusion as evidenced by meal completion < 50%.  GOAL:   Patient will meet greater than or equal to 90% of their needs  MONITOR:   PO intake, Supplement acceptance, Labs, Weight trends, I & O's  REASON FOR ASSESSMENT:   Malnutrition Screening Tool    ASSESSMENT:   68 year old female with PMHx of DM, HTN, HLD, gout, OA, IBS, Crohn's disease, diverticulosis, GERD, nonischemic cardiomyopathy, CHF admitted with HHNK, acute metabolic encephalopathy, sepsis, AKI on CKD stage III, coffee-ground emesis.  Met with patient at bedside this morning but she was very sleepy/lethargic. She would open her eyes briefly but could not stay awake long enough to answer any question. Per chart she had bites of breakfast this morning. Will monitor for adequacy of intake once patient is more alert.  Weights in chart vary significantly so difficult to trend. She was 68 kg on 07/08/2019, 66.2 kg on 07/21/2019, and is now 62 kg (136.69 lbs). Unable to determine if it is true weight loss or related to fluid status.  Medications reviewed and include: Novolog 0-20 units Q4hrs, Lantus 18 units BID, pantoprazole, ceftriaxone, 1/2NS with KCl 40 mEq at 75 mL/hr.  Labs reviewed: CBG 142-219, Sodium 146, Potassium 2.8, Chloride 112.  Patient does not meet criteria for malnutrition at this time but is at risk for malnutrition.  NUTRITION - FOCUSED PHYSICAL EXAM:    Most Recent Value  Orbital Region  No depletion  Upper Arm Region  Mild depletion  Thoracic and Lumbar Region  No depletion  Buccal Region  No depletion  Temple Region  Mild depletion  Clavicle Bone Region  No depletion  Clavicle and Acromion Bone Region  No depletion  Scapular Bone  Region  No depletion  Dorsal Hand  No depletion  Patellar Region  Mild depletion  Anterior Thigh Region  Mild depletion  Posterior Calf Region  Mild depletion  Edema (RD Assessment)  -- [non pitting]  Hair  Reviewed  Eyes  Unable to assess  Mouth  Unable to assess  Skin  Reviewed  Nails  Reviewed     Diet Order:   Diet Order            Diet Carb Modified Fluid consistency: Thin; Room service appropriate? Yes  Diet effective now             EDUCATION NEEDS:   No education needs have been identified at this time  Skin:  Skin Assessment: Reviewed RN Assessment  Last BM:  Unknown  Height:   Ht Readings from Last 1 Encounters:  08/05/19 5' 3"  (1.6 m)   Weight:   Wt Readings from Last 1 Encounters:  08/05/19 62 kg   Ideal Body Weight:  52.3 kg  BMI:  Body mass index is 24.21 kg/m.  Estimated Nutritional Needs:   Kcal:  1500-1700  Protein:  75-85 grams  Fluid:  1.5-1.7 L/day  Jacklynn Barnacle, MS, RD, LDN Office: [phone] Pager: [phone] After Hours/Weekend Pager: [phone]

## 2019-08-07 NOTE — Progress Notes (Signed)
Kristy Antigua, MD [address] [place], [place], [place], [address] [address], [place], [place], [place], [address] Phone: [phone]  Fax: [phone]   Subjective: No episodes of vomiting. Pt resting in bed comfortably. No episodes of GI bleeding   Objective: Exam: Vital signs in last 24 hours: Vitals:   08/07/19 0700 08/07/19 0744 08/07/19 0800 08/07/19 0900  BP: (!) 158/90 (!) 158/90 (!) 164/98   Pulse: 91 95 78 90  Resp: 14 15 13 15   Temp:  98.2 F (36.8 C)    TempSrc:  Axillary    SpO2: 93% 95% 95% 94%  Weight:      Height:       Weight change:   Intake/Output Summary (Last 24 hours) at 08/07/2019 1120 Last data filed at 08/07/2019 1007 Gross per 24 hour  Intake 3037.89 ml  Output 2575 ml  Net 462.89 ml    General: No acute distress, AAO x3 Abd: Soft, NT/ND, No HSM Skin: Warm, no rashes Neck: Supple, Trachea midline   Lab Results: Lab Results  Component Value Date   WBC 17.4 (H) 08/07/2019   HGB 14.9 08/07/2019   HCT 45.6 08/07/2019   MCV 89.1 08/07/2019   PLT 143 (L) 08/07/2019   Micro Results: Recent Results (from the past 240 hour(s))  SARS Coronavirus 2 by RT PCR (hospital order, performed in [place] hospital lab) Nasopharyngeal Nasopharyngeal Swab     Status: None   Collection Time: 08/04/19  8:57 PM   Specimen: Nasopharyngeal Swab  Result Value Ref Range Status   SARS Coronavirus 2 NEGATIVE NEGATIVE Final    Comment: (NOTE) If result is NEGATIVE SARS-CoV-2 target nucleic acids are NOT DETECTED. The SARS-CoV-2 RNA is generally detectable in upper and lower  respiratory specimens during the acute phase of infection. The lowest  concentration of SARS-CoV-2 viral copies this assay can detect is 250  copies / mL. A negative result does not preclude SARS-CoV-2 infection  and should not be used as the sole basis for treatment or other  patient management decisions.  A negative result may occur with  improper specimen  collection / handling, submission of specimen other  than nasopharyngeal swab, presence of viral mutation(s) within the  areas targeted by this assay, and inadequate number of viral copies  (<250 copies / mL). A negative result must be combined with clinical  observations, patient history, and epidemiological information. If result is POSITIVE SARS-CoV-2 target nucleic acids are DETECTED. The SARS-CoV-2 RNA is generally detectable in upper and lower  respiratory specimens dur ing the acute phase of infection.  Positive  results are indicative of active infection with SARS-CoV-2.  Clinical  correlation with patient history and other diagnostic information is  necessary to determine patient infection status.  Positive results do  not rule out bacterial infection or co-infection with other viruses. If result is PRESUMPTIVE POSTIVE SARS-CoV-2 nucleic acids MAY BE PRESENT.   A presumptive positive result was obtained on the submitted specimen  and confirmed on repeat testing.  While 2019 novel coronavirus  (SARS-CoV-2) nucleic acids may be present in the submitted sample  additional confirmatory testing may be necessary for epidemiological  and / or clinical management purposes  to differentiate between  SARS-CoV-2 and other Sarbecovirus currently known to infect humans.  If clinically indicated additional testing with an alternate test  methodology [phone]) is advised. The SARS-CoV-2 RNA is generally  detectable in upper and lower respiratory sp ecimens during the acute  phase of infection.  The expected result is Negative. Fact Sheet for Patients:  StrictlyIdeas.no Fact Sheet for Healthcare Providers: BankingDealers.co.za This test is not yet approved or cleared by the [place] FDA and has been authorized for detection and/or diagnosis of SARS-CoV-2 by FDA under an Emergency Use Authorization (EUA).  This EUA will remain in effect  (meaning this test can be used) for the duration of the COVID-19 declaration under Section 564(b)(1) of the Act, 21 U.S.C. section 360bbb-3(b)(1), unless the authorization is terminated or revoked sooner. Performed at [facility], [address] [place], [place] [address]   Blood culture (routine x 2)     Status: None (Preliminary result)   Collection Time: 08/05/19 12:56 AM   Specimen: BLOOD  Result Value Ref Range Status   Specimen Description BLOOD BLOOD LEFT HAND  Final   Special Requests   Final    BOTTLES DRAWN AEROBIC AND ANAEROBIC Blood Culture adequate volume   Culture   Final    NO GROWTH 2 DAYS Performed at [facility], [address], [place], [place] [address]    Report Status PENDING  Incomplete  Blood culture (routine x 2)     Status: Abnormal (Preliminary result)   Collection Time: 08/05/19 12:58 AM   Specimen: BLOOD  Result Value Ref Range Status   Specimen Description   Final    BLOOD BLOOD RIGHT FOREARM Performed at [name], [address], [place], [place] [address]    Special Requests   Final    BOTTLES DRAWN AEROBIC AND ANAEROBIC Blood Culture results may not be optimal due to an inadequate volume of blood received in culture bottles Performed at [facility], [address] [place], [place] [address]    Culture  Setup Time   Final    GRAM POSITIVE COCCI ANAEROBIC BOTTLE ONLY CRITICAL RESULT CALLED TO, READ BACK BY AND VERIFIED WITH: [place] ON 08/06/2019 SNG Performed at [facility], [address] [place], [place] [address]    Culture (A)  Final    STAPHYLOCOCCUS SPECIES (COAGULASE NEGATIVE) THE SIGNIFICANCE OF ISOLATING THIS ORGANISM FROM A SINGLE SET OF BLOOD CULTURES WHEN MULTIPLE SETS ARE DRAWN IS UNCERTAIN. PLEASE NOTIFY THE MICROBIOLOGY DEPARTMENT WITHIN ONE WEEK IF SPECIATION AND SENSITIVITIES ARE REQUIRED. Performed at [facility], [place] [address] [place],  [place] [address]    Report Status PENDING  Incomplete  MRSA PCR Screening     Status: None   Collection Time: 08/05/19 11:56 AM   Specimen: Nasopharyngeal  Result Value Ref Range Status   MRSA by PCR NEGATIVE NEGATIVE Final    Comment:        The GeneXpert MRSA Assay (FDA approved for NASAL specimens only), is one component of a comprehensive MRSA colonization surveillance program. It is not intended to diagnose MRSA infection nor to guide or monitor treatment for MRSA infections. Performed at [facility], [address] [place], [place] [address]   Urine Culture     Status: None   Collection Time: 08/05/19  4:10 PM   Specimen: Urine, Random  Result Value Ref Range Status   Specimen Description   Final    URINE, RANDOM Performed at [facility], [address] [place], [place] [address]    Special Requests   Final    NONE Performed at [facility], [address], [place], [place] [address]    Culture   Final    NO GROWTH Performed at  [facility], [place] [address] [place], [place] [address]    Report Status 08/06/2019 FINAL  Final   Studies/Results: Dg Abd Portable 1v  Result Date: 08/06/2019 CLINICAL DATA:  Abdominal pain. EXAM: PORTABLE ABDOMEN - 1 VIEW COMPARISON:  CT abdomen pelvis dated May 14, 2018. FINDINGS: The bowel gas pattern is normal. No radio-opaque calculi or other significant radiographic abnormality are seen. No acute osseous abnormality. Old right inferior pubic ramus fracture. IMPRESSION: Negative. Electronically Signed   By: Titus Dubin M.D.   On: 08/06/2019 12:24   Medications:  Scheduled Meds: . carvedilol  12.5 mg Oral BID WC  . Chlorhexidine Gluconate Cloth  6 each Topical Daily  . FLUoxetine  20 mg Oral Daily  . influenza vaccine adjuvanted  0.5 mL Intramuscular Tomorrow-1000  . insulin aspart  0-20 Units Subcutaneous Q4H  . insulin glargine  18 Units Subcutaneous BID  . ivabradine  5 mg Oral BID WC  .  [START ON 08/08/2019] pantoprazole  40 mg Intravenous Q12H  . pneumococcal 23 valent vaccine  0.5 mL Intramuscular Tomorrow-1000   Continuous Infusions: . sodium chloride Stopped (08/07/19 0956)  . cefTRIAXone (ROCEPHIN)  IV Stopped (08/06/19 1820)  . potassium chloride 10 mEq (08/07/19 1053)  . potassium PHOSPHATE IVPB (in mmol) 85 mL/hr at 08/07/19 1007  . [START ON 08/08/2019] vancomycin     PRN Meds:.morphine injection   Assessment: Coffee ground emesis  Plan: No emesis since presentation Hemoglobin has stated normal since presentation  I asked the pt again today, after discussing this with her yesterday as well, about doing an EGD to rule out any underlying lesions.  Pt remains unsure if she would want the procedure. I have explained an EGD to her in detail and answered all her questions to her satisfaction.   Continue PPI BID for 30 days - PO is ok  Again, since her hemoglobin never dropped, the coffee ground emesis may not have represented a GI bleed and may have been stomach contents. As per the niece who witnessed the emesis around her and found her at home, there was no red blood  Since pt does not know if she would want to proceed with an EGD, GI service will sign off at this time.  If pt changes her mind please feel free to page [place]   LOS: 3 days   Kristy Antigua, MD 08/07/2019, 11:20 AM

## 2019-08-08 LAB — BASIC METABOLIC PANEL
Anion gap: 7 (ref 5–15)
BUN: 14 mg/dL (ref 8–23)
CO2: 23 mmol/L (ref 22–32)
Calcium: 7.7 mg/dL — ABNORMAL LOW (ref 8.9–10.3)
Chloride: 111 mmol/L (ref 98–111)
Creatinine, Ser: 0.73 mg/dL (ref 0.44–1.00)
GFR calc Af Amer: >60 mL/min (ref >60)
GFR calc non Af Amer: >60 mL/min (ref >60)
Glucose, Bld: 104 mg/dL — ABNORMAL HIGH (ref 70–99)
Potassium: 3.8 mmol/L (ref 3.5–5.1)
Sodium: 141 mmol/L (ref 135–145)

## 2019-08-08 LAB — CBC
HCT: 41.5 % (ref 36.0–46.0)
Hemoglobin: 13.5 g/dL (ref 12.0–15.0)
MCH: 29.1 pg (ref 26.0–34.0)
MCHC: 32.5 g/dL (ref 30.0–36.0)
MCV: 89.4 fL (ref 80.0–100.0)
Platelets: 121 10*3/uL — ABNORMAL LOW (ref 150–400)
RBC: 4.64 MIL/uL (ref 3.87–5.11)
RDW: 13.4 % (ref 11.5–15.5)
WBC: 13.9 10*3/uL — ABNORMAL HIGH (ref 4.0–10.5)
nRBC: 0 % (ref 0.0–0.2)

## 2019-08-08 LAB — GLUCOSE, CAPILLARY
Glucose-Capillary: 112 mg/dL — ABNORMAL HIGH (ref 70–99)
Glucose-Capillary: 96 mg/dL (ref 70–99)
Glucose-Capillary: 199 mg/dL — ABNORMAL HIGH (ref 70–99)
Glucose-Capillary: 170 mg/dL — ABNORMAL HIGH (ref 70–99)
Glucose-Capillary: 158 mg/dL — ABNORMAL HIGH (ref 70–99)
Glucose-Capillary: 157 mg/dL — ABNORMAL HIGH (ref 70–99)

## 2019-08-08 LAB — MAGNESIUM: Magnesium: 1.5 mg/dL — ABNORMAL LOW (ref 1.7–2.4)

## 2019-08-08 LAB — PHOSPHORUS: Phosphorus: 1.5 mg/dL — ABNORMAL LOW (ref 2.5–4.6)

## 2019-08-08 LAB — PROCALCITONIN: Procalcitonin: <0.1 ng/mL

## 2019-08-08 MED ORDER — MAGNESIUM SULFATE 2 GM/50ML IV SOLN
2.0000 g | Freq: Once | INTRAVENOUS | Status: AC
  Administered 2019-08-08: 2 g via INTRAVENOUS
  Filled 2019-08-08: qty 50

## 2019-08-08 MED ORDER — POTASSIUM PHOSPHATES 15 MMOLE/5ML IV SOLN
30.0000 mmol | Freq: Once | INTRAVENOUS | Status: AC
  Administered 2019-08-08: 30 mmol via INTRAVENOUS
  Filled 2019-08-08: qty 10

## 2019-08-08 MED ORDER — SACUBITRIL-VALSARTAN 49-51 MG PO TABS
1.0000 | ORAL_TABLET | Freq: Two times a day (BID) | ORAL | Status: DC
  Administered 2019-08-08 – 2019-08-14 (×13): 1 via ORAL
  Filled 2019-08-08 (×17): qty 1

## 2019-08-08 NOTE — Progress Notes (Signed)
Patient transferred to 2A from ICU.  Family at bedside.  Oriented to floor.  Call bell in reach. Bed alarm on for safety.

## 2019-08-08 NOTE — Evaluation (Signed)
Occupational Therapy Evaluation Patient Details Name: Kristy [name] MRN: [id number] DOB: 1951-07-30 Today's Date: 08/08/2019    History of Present Illness Pt is a 68 y.o. female with a known history of multiple medical problems that will be mentioned below, including coronary artery disease, nonischemic cardiomyopathy, Crohn's disease and type 2 diabetes mellitus with history of DKA, presented to emergency room with acute onset of hematemesis and altered mental status with decreased responsiveness.  MD assessment includes: Poorly Controlled DM II, Acute metabolic encephalopathy, hypernatremia, hypmagnesemia, hypokalemia, sepsis with possible UTI, coffee-ground emesis, HFrEF, and abdominal pain.   Clinical Impression   Pt seen for OT evaluation this date. Prior to hospital admission, pt was experiencing functional decline with multiple hospitalizations recently. Pt lives alone and has limited family assist available. Pt with hx of non compliance and poorly managed DM mgt. Currently pt demonstrates impairments in activity tolerance, cognition including poor sequencing and safety awareness, strength, and balance requiring min-mod assist for LB ADL and Min A for functional mobility with cues for sequencing/safety to minimize risk of falls/LOB. Pt would benefit from skilled OT to address noted impairments and functional limitations (see below for any additional details) in order to maximize safety and independence while minimizing falls risk and caregiver burden. Recommend additional cognitive assessment and independent living skills assessment once pt is more medically stable. Upon hospital discharge, recommend pt discharge to SNF. May consider ALF or memory care following rehab depending on pt's overall physical and cognitive functional status.     Follow Up Recommendations  SNF    Equipment Recommendations  3 in 1 bedside commode    Recommendations for Other Services       Precautions /  Restrictions Precautions Precautions: Fall Restrictions Weight Bearing Restrictions: No LLE Weight Bearing: Weight bearing as tolerated      Mobility Bed Mobility Overal bed mobility: Needs Assistance Bed Mobility: Sit to Supine     Supine to sit: Min assist Sit to supine: Min guard   General bed mobility comments: Min A for stability/safety during bed mobility tasks  Transfers Overall transfer level: Needs assistance Equipment used: Rolling walker (2 wheeled) Transfers: Sit to/from Stand Sit to Stand: Min assist         General transfer comment: Min A to prevent post LOB upon initial stand    Balance Overall balance assessment: Needs assistance Sitting-balance support: Single extremity supported;Feet supported Sitting balance-Leahy Scale: Fair     Standing balance support: Bilateral upper extremity supported Standing balance-Leahy Scale: Fair Standing balance comment: Posterior instability in standing requiring Mod A to prevent LOB                           ADL either performed or assessed with clinical judgement   ADL Overall ADL's : Needs assistance/impaired                                       General ADL Comments: Min A for LB ADL and functional ADL transfers; cues for safety/sequencing     Vision Baseline Vision/History: Wears glasses Wears Glasses: Reading only Patient Visual Report: No change from baseline       Perception     Praxis      Pertinent Vitals/Pain Pain Assessment: No/denies pain     Hand Dominance     Extremity/Trunk Assessment Upper Extremity Assessment Upper Extremity Assessment: Generalized  weakness   Lower Extremity Assessment Lower Extremity Assessment: Generalized weakness       Communication Communication Communication: No difficulties   Cognition Arousal/Alertness: Awake/alert Behavior During Therapy: WFL for tasks assessed/performed Overall Cognitive Status: History of cognitive  impairments - at baseline                                 General Comments: per niece present for session, pt has cognitve deficits at baseline; currently a bit more confused than baseline but able to follow commands with cues for sequencing and safety   General Comments       Exercises Other Exercises: pt required Min A to don socks while seated in recliner, cues for sequencing Other Exercises: functional transfer training, cues for hand placement, weight shifting anteriorly   Shoulder Instructions      Home Living Family/patient expects to be discharged to:: Private residence Living Arrangements: Alone Available Help at Discharge: Family;Available PRN/intermittently Type of Home: House Home Access: Stairs to enter [employer] of Steps: 5 Entrance Stairs-Rails: Can reach both Home Layout: One level     Bathroom Shower/Tub: Teacher, early years/pre: Handicapped height     Home Equipment: Environmental consultant - 2 wheels;Cane - single point;Shower seat;Crutches;Grab bars - tub/shower          Prior Functioning/Environment Level of Independence: Independent        Comments: Pt reports Ind Amb without an AD, 20 falls in the last year but only 1 in the last 3 months after changing to a new type of shoe, Ind with ADLs        OT Problem List: Decreased strength;Decreased cognition;Decreased activity tolerance;Decreased safety awareness;Decreased knowledge of use of DME or AE;Impaired balance (sitting and/or standing)      OT Treatment/Interventions: Self-care/ADL training;Therapeutic exercise;Therapeutic activities;DME and/or AE instruction;Patient/family education;Cognitive remediation/compensation;Balance training    OT Goals(Current goals can be found in the care plan section) Acute Rehab OT Goals Patient Stated Goal: to get stronger before moving to ALF OT Goal Formulation: With patient/family Time For Goal Achievement: 08/22/19 Potential to  Achieve Goals: Good ADL Goals Pt Will Perform Lower Body Dressing: with min guard assist;sit to/from stand;with adaptive equipment Pt Will Transfer to Toilet: with min guard assist;ambulating;bedside commode(LRAD for amb, Min VC for safety/sequencing) Additional ADL Goal #1: Pt will perform seated bathing tasks with set up and supervision  OT Frequency: Min 1X/week   Barriers to D/C: Decreased caregiver support          Co-evaluation              AM-PAC OT "6 Clicks" Daily Activity     Outcome Measure Help from another person eating meals?: None Help from another person taking care of personal grooming?: None Help from another person toileting, which includes using toliet, bedpan, or urinal?: A Little Help from another person bathing (including washing, rinsing, drying)?: A Lot Help from another person to put on and taking off regular upper body clothing?: A Little Help from another person to put on and taking off regular lower body clothing?: A Lot 6 Click Score: 18   End of Session Equipment Utilized During Treatment: Gait belt;Rolling walker  Activity Tolerance: Patient tolerated treatment well Patient left: in bed;with call bell/phone within reach;with bed alarm set;with family/visitor present  OT Visit Diagnosis: Other abnormalities of gait and mobility (R26.89);Repeated falls (R29.6);Muscle weakness (generalized) (M62.81);Other symptoms and signs involving cognitive function  Time: 5834-6219 OT Time Calculation (min): 49 min Charges:  OT General Charges $OT Visit: 1 Visit OT Evaluation $OT Eval Moderate Complexity: 1 Mod OT Treatments $Self Care/Home Management : 8-22 mins $Therapeutic Activity: 8-22 mins  Jeni Salles, MPH, MS, OTR/L ascom [phone] 08/08/19, 2:47 PM

## 2019-08-08 NOTE — Progress Notes (Addendum)
PROGRESS NOTE    Kristy [name]  KNL:[id number] DOB: 1950/10/10 DOA: 08/04/2019 PCP: Owens Loffler, MD      Brief Narrative:  [name] is a 68 y.o. F with HFrEF (LVEF 35-40% 04/2019, NICM),  LBBB, HTN, HLD, GERD, Crohn's disease, and poorly controlled DM who presented with decreased mentation, decreased oral intake, confusion, and vomiting what appeared to be coffee-ground material.  In the ER, coffee-ground emesis noted patient was tachycardic and hypotensive.  She was started on Protonix and given empiric PRBC's.  Further work-up showed glucose 1324, no acidosis.  She was started on insulin as well as empiric antibiotics and fluids and admitted.        Assessment & Plan:  HHNK Poorly Controlled DM II UDS negative.  In the absence of significant acidosis or anion gap elevation and in the setting of glucose over 1000, suspect this is likely HHNK.  Glucose is now controlled -Continue Lantus 18 units BID with resistant SSI  -Continue meal coverage insulin 3 units -carb modified diet   Acute metabolic encephalopathy In setting of HHNK, electrolyte disturbances, dehydration superimposed on age-related cognitive changes. CT head & UDS negative on admit.  B12, ammonia, TSH normal. Mental status is improved today, she notes some cloudy headedness, confusion from her baseline -needs aggressive PT efforts  -OOB  -consider PSY consult once patient able to participate given concerns for possible depression / poor self care -Consider neuropsych cognitive testing once acute illness resolved  -continue aggressive PT efforts   Hypernatremia Hypomagnesemia Hypokalemia  Hypophosphatemia  Phosphorus, magnesium still low -Replace K, Phos, mag -Stop IV fluids   Suspected UTI, R/O Other Etiology  Initially suspected possible sepsis from UTI on admission and completed 5 days ceftriaxone but urine culture negative, now feel this has been ruled out.  Cultures of blood with 1/2  coag-neg staph (likely contaminant).  PCT negative.    AKI on CKD 3 Lactic Acidosis  Hypernatremia / Hyperchloremia  Sr Cr improved to baseline.  Sodium resolved, mentation improved. -Stop IVF, monitor PO intake  -follow BMP / UOP  -avoid nephrotoxic agents  Possible Coffee-Ground Emesis  Screening colonoscopy in 10/2017 per Dr. Carlean Purl, diverticulosis & 1 rectal polyp noted / otherwise normal. She was treated initially as GIB with emergent PRBC and PPI gtt. Hgb normal on admit & has remained stable throughout course. Pt on plavix at baseline. There is doubt about whether this was actually a GI bleed (hgb has remained stable through hospitalization, no clinical hematemesis or melena ever noted here).   -appreciate GI  -Continue PPI BID   HFrEF 04/2019 EF 35-40%, LHC with stable appearance of coronary arteries, mild to moderate non-obstructive disease to LAD, LCx, mildly elevated LV filling pressure.  -Continue spironolactone, ivabradine, coreg, ASA, plavix -Resume Entrestro  ABD Pain  Resolved, KUB unremarkable       DVT prophylaxis: SCD's Code Status: Full Code  Family Communication: Niece updated 11/6 at bedside.    Consultants:   GI   PCCM   Procedures:     Diagnostic Tests:  CT Head 11/3 >> negative for acute process   KUB 11/4 >> negative   Cultures  COVID 11/2 >> negative   BCx2 GPC 1/2 >>   UC 11/3 >> negative   Antimicrobials:   Cefepime 11/2 >> 11/3   Vanco 11/2 >> 11/3, 11/4 >> 11/5  Ceftriaxone 11/3 >> 11/6   Subjective: Her confusion is improving, she had no fever, vomiting, and melena, dyspnea, pain.  She is  generally weak.  Objective: Vitals:   08/08/19 1300 08/08/19 1400 08/08/19 1500 08/08/19 1531  BP:  114/67  118/67  Pulse: 68 78 72 66  Resp: 19 (!) 22 17 17   Temp:    98.3 F (36.8 C)  TempSrc:    Oral  SpO2: 94% 97% 95% 95%  Weight:      Height:        Intake/Output Summary (Last 24 hours) at 08/08/2019 1934 Last data  filed at 08/08/2019 1802 Gross per 24 hour  Intake 2284.52 ml  Output 1275 ml  Net 1009.52 ml   Filed Weights   08/04/19 2054 08/05/19 0630  Weight: 72.6 kg 62 kg    Examination: General appearance: Older adult female, alert and in no acute distress.  Sitting in recliner HEENT: Anicteric, conjunctiva pink, lids and lashes normal. No nasal deformity, discharge, epistaxis.  Lips moist, teeth normal. OP moist no oral lesions.   Skin: Warm and dry.  No suspicious rashes or lesions. Cardiac: RRR, no murmurs appreciated.  JVP normal, no LE edema.    Respiratory: Normal respiratory rate and rhythm.  CTAB without rales or wheezes. Abdomen: Abdomen soft.  No TTP or guarding. No ascites, distension, hepatosplenomegaly.   MSK: No deformities or effusions of the large joints of the upper or lower extremities bilaterally. Neuro: Awake and alert. Naming is grossly intact, but the patient's recall, and general fund of knowledge seem impaired.  Muscle tone diminished, moves all extremities with generalized weakness.  Speech fluent.    Psych: Sensorium intact and responding to questions, attention somewhat diminished, affect blunted, judgment insight appear impaired.      Data Reviewed: I have personally reviewed following labs and imaging studies:  CBC: Recent Labs  Lab 08/05/19 0057 08/05/19 0931 08/05/19 1324 08/06/19 0459 08/07/19 0433 08/08/19 0458  WBC 26.7*  --  28.1* 23.5* 17.4* 13.9*  NEUTROABS 23.1*  --  23.1* 17.6*  --   --   HGB 16.8* 14.9 15.0 15.5* 14.9 13.5  HCT 56.1* 44.9 45.2 47.6* 45.6 41.5  MCV 97.4  --  89.5 89.5 89.1 89.4  PLT 318  --  217 180 143* 683*   Basic Metabolic Panel: Recent Labs  Lab 08/05/19 1324 08/05/19 1807 08/06/19 0459 08/07/19 0433 08/07/19 1959 08/08/19 0458  NA 157*  156* 152* 155* 146*  --  141  K 3.7  3.8 3.7 3.7 2.8* 3.9 3.8  CL 124*  124* 121* 121* 112*  --  111  CO2 22  22 21* 24 24  --  23  GLUCOSE 129*  130* 227* 239* 226*  --   104*  BUN 56*  53* 47* 40* 23  --  14  CREATININE 1.64*  1.68* 1.53* 1.41* 0.95  --  0.73  CALCIUM 9.3  9.3 8.9 9.1 8.2*  --  7.7*  MG  --   --  1.7 1.6* 1.8 1.5*  PHOS  --   --   --  1.2* 1.9* 1.5*   GFR: Estimated Creatinine Clearance: 55.7 mL/min (by C-G formula based on SCr of 0.73 mg/dL). Liver Function Tests: Recent Labs  Lab 08/04/19 2057  AST 22  ALT 19  ALKPHOS 151*  BILITOT 1.1  PROT 8.7*  ALBUMIN 3.8   No results for input(s): LIPASE, AMYLASE in the last 168 hours. Recent Labs  Lab 08/07/19 1321  AMMONIA 16   Coagulation Profile: Recent Labs  Lab 08/04/19 2057 08/05/19 0125  INR 1.3* 1.3*   Cardiac Enzymes:  No results for input(s): CKTOTAL, CKMB, CKMBINDEX, TROPONINI in the last 168 hours. BNP (last 3 results) No results for input(s): PROBNP in the last 8760 hours. HbA1C: No results for input(s): HGBA1C in the last 72 hours. CBG: Recent Labs  Lab 08/07/19 2330 08/08/19 0434 08/08/19 0746 08/08/19 1134 08/08/19 1729  GLUCAP 179* 112* 96 199* 170*   Lipid Profile: No results for input(s): CHOL, HDL, LDLCALC, TRIG, CHOLHDL, LDLDIRECT in the last 72 hours. Thyroid Function Tests: Recent Labs    08/07/19 1321  TSH 1.325   Anemia Panel: Recent Labs    08/07/19 1321  VITAMINB12 491   Urine analysis:    Component Value Date/Time   COLORURINE YELLOW (A) 08/04/2019 2057   APPEARANCEUR CLOUDY (A) 08/04/2019 2057   LABSPEC 1.021 08/04/2019 2057   PHURINE 6.0 08/04/2019 2057   GLUCOSEU >=500 (A) 08/04/2019 2057   HGBUR SMALL (A) 08/04/2019 2057   HGBUR large 03/22/2010 1533   BILIRUBINUR NEGATIVE 08/04/2019 2057   KETONESUR 5 (A) 08/04/2019 2057   PROTEINUR 30 (A) 08/04/2019 2057   UROBILINOGEN 0.2 03/22/2010 1533   NITRITE NEGATIVE 08/04/2019 2057   LEUKOCYTESUR MODERATE (A) 08/04/2019 2057   Sepsis Labs: @LABRCNTIP (procalcitonin:4,lacticacidven:4)  ) Recent Results (from the past 240 hour(s))  SARS Coronavirus 2 by RT PCR  (hospital order, performed in [facility] hospital lab) Nasopharyngeal Nasopharyngeal Swab     Status: None   Collection Time: 08/04/19  8:57 PM   Specimen: Nasopharyngeal Swab  Result Value Ref Range Status   SARS Coronavirus 2 NEGATIVE NEGATIVE Final    Comment: (NOTE) If result is NEGATIVE SARS-CoV-2 target nucleic acids are NOT DETECTED. The SARS-CoV-2 RNA is generally detectable in upper and lower  respiratory specimens during the acute phase of infection. The lowest  concentration of SARS-CoV-2 viral copies this assay can detect is 250  copies / mL. A negative result does not preclude SARS-CoV-2 infection  and should not be used as the sole basis for treatment or other  patient management decisions.  A negative result may occur with  improper specimen collection / handling, submission of specimen other  than nasopharyngeal swab, presence of viral mutation(s) within the  areas targeted by this assay, and inadequate number of viral copies  (<250 copies / mL). A negative result must be combined with clinical  observations, patient history, and epidemiological information. If result is POSITIVE SARS-CoV-2 target nucleic acids are DETECTED. The SARS-CoV-2 RNA is generally detectable in upper and lower  respiratory specimens dur ing the acute phase of infection.  Positive  results are indicative of active infection with SARS-CoV-2.  Clinical  correlation with patient history and other diagnostic information is  necessary to determine patient infection status.  Positive results do  not rule out bacterial infection or co-infection with other viruses. If result is PRESUMPTIVE POSTIVE SARS-CoV-2 nucleic acids MAY BE PRESENT.   A presumptive positive result was obtained on the submitted specimen  and confirmed on repeat testing.  While 2019 novel coronavirus  (SARS-CoV-2) nucleic acids may be present in the submitted sample  additional confirmatory testing may be necessary for  epidemiological  and / or clinical management purposes  to differentiate between  SARS-CoV-2 and other Sarbecovirus currently known to infect humans.  If clinically indicated additional testing with an alternate test  methodology [phone]) is advised. The SARS-CoV-2 RNA is generally  detectable in upper and lower respiratory sp ecimens during the acute  phase of infection. The expected result is Negative. Fact Sheet for  Patients:  StrictlyIdeas.no Fact Sheet for Healthcare Providers: BankingDealers.co.za This test is not yet approved or cleared by the [place] FDA and has been authorized for detection and/or diagnosis of SARS-CoV-2 by FDA under an Emergency Use Authorization (EUA).  This EUA will remain in effect (meaning this test can be used) for the duration of the COVID-19 declaration under Section 564(b)(1) of the Act, 21 U.S.C. section 360bbb-3(b)(1), unless the authorization is terminated or revoked sooner. Performed at [facility], [address] [place], [place] [address]   Blood culture (routine x 2)     Status: None (Preliminary result)   Collection Time: 08/05/19 12:56 AM   Specimen: BLOOD  Result Value Ref Range Status   Specimen Description BLOOD BLOOD LEFT HAND  Final   Special Requests   Final    BOTTLES DRAWN AEROBIC AND ANAEROBIC Blood Culture adequate volume   Culture   Final    NO GROWTH 2 DAYS Performed at [facility], [address], [place], [place] [address]    Report Status PENDING  Incomplete  Blood culture (routine x 2)     Status: Abnormal (Preliminary result)   Collection Time: 08/05/19 12:58 AM   Specimen: BLOOD  Result Value Ref Range Status   Specimen Description   Final    BLOOD BLOOD RIGHT FOREARM Performed at [facility], [address] [place], [place] [address]    Special Requests   Final    BOTTLES DRAWN AEROBIC AND ANAEROBIC Blood Culture results may  not be optimal due to an inadequate volume of blood received in culture bottles Performed at [facility], [address], [place], [place] [address]    Culture  Setup Time   Final    GRAM POSITIVE COCCI ANAEROBIC BOTTLE ONLY CRITICAL RESULT CALLED TO, READ BACK BY AND VERIFIED WITH: [employer] ON 08/06/2019 SNG Performed at [facility], [address] [place], [place] [address]    Culture (A)  Final    STAPHYLOCOCCUS SPECIES (COAGULASE NEGATIVE) THE SIGNIFICANCE OF ISOLATING THIS ORGANISM FROM A SINGLE SET OF BLOOD CULTURES WHEN MULTIPLE SETS ARE DRAWN IS UNCERTAIN. PLEASE NOTIFY THE MICROBIOLOGY DEPARTMENT WITHIN ONE WEEK IF SPECIATION AND SENSITIVITIES ARE REQUIRED. Performed at [facility], [place] [address] [place], [place] [address]    Report Status PENDING  Incomplete  MRSA PCR Screening     Status: None   Collection Time: 08/05/19 11:56 AM   Specimen: Nasopharyngeal  Result Value Ref Range Status   MRSA by PCR NEGATIVE NEGATIVE Final    Comment:        The GeneXpert MRSA Assay (FDA approved for NASAL specimens only), is one component of a comprehensive MRSA colonization surveillance program. It is not intended to diagnose MRSA infection nor to guide or monitor treatment for MRSA infections. Performed at [name], [address], [place], [place] [address]   Urine Culture     Status: None   Collection Time: 08/05/19  4:10 PM   Specimen: Urine, Random  Result Value Ref Range Status   Specimen Description   Final    URINE, RANDOM Performed at [facility], [address], [place], [place] [address]    Special Requests   Final    NONE Performed at [facility], [address] [place], [place] [address]    Culture   Final    NO GROWTH Performed at [facility], [place],  [place] [address]    Report Status 08/06/2019 FINAL  Final         Radiology Studies: No results  found.      Scheduled Meds: . aspirin EC  81 mg Oral Daily  . carvedilol  12.5 mg Oral BID WC  . Chlorhexidine Gluconate Cloth  6 each Topical Daily  . clopidogrel  75 mg Oral Daily  . FLUoxetine  20 mg Oral Daily  . influenza vaccine adjuvanted  0.5 mL Intramuscular Tomorrow-1000  . insulin aspart  0-20 Units Subcutaneous Q4H  . insulin aspart  3 Units Subcutaneous TID WC  . insulin glargine  18 Units Subcutaneous BID  . ivabradine  5 mg Oral BID WC  . pantoprazole  40 mg Intravenous Q12H  . pneumococcal 23 valent vaccine  0.5 mL Intramuscular Tomorrow-1000  . Ensure Max Protein  11 oz Oral BID BM  . sacubitril-valsartan  1 tablet Oral BID  . spironolactone  25 mg Oral Daily   Continuous Infusions:    LOS: 4 days    Time spent: 25 minutes  Noe Gens, AG-ACNP-S [facility] 08/08/2019, 7:34 PM     Please page through [place]:  [email or web address] Password [email or web address] If 7PM-7AM, please contact night-coverage        Attending MD Note:  I have seen and examined the patient with NP student and agree with the note above which has been edited to reflect our agreed upon history, exam, and assessment/plan.    I have personally reviewed the orders for the patient, which were made under my direction.        Nance

## 2019-08-08 NOTE — TOC Progression Note (Signed)
Transition of Care [facility]) - Progression Note    Patient Details  Name: IMUNIQUE SAMAD MRN: [id number] Date of Birth: 1950/12/29  Transition of Care [facility]) CM/SW [place], [place] Phone Number: 08/08/2019, 4:48 PM  Clinical Narrative:   CSW received call from [place] at [facility]. Magda Paganini states that they can accept patient but family would need to provide Corlan[place] and Entresto medications. Magda Paganini states that she will begin [place] authorization today. CSW notified patient and niece of above. Niece states that she can bring medications to facility when discharged. CSW will continue to follow for discharge planning.     Expected Discharge Plan: [place] Barriers to Discharge: Continued Medical Work up  Expected Discharge Plan and Services Expected Discharge Plan: [place] Choice: [place] arrangements for the past 2 months: Single Family Home                                       Social Determinants of Health (SDOH) Interventions    Readmission Risk Interventions Readmission Risk Prevention Plan 04/28/2019 04/27/2019  Post Dischage Appt - Complete  Medication Screening - Complete  Transportation Screening - Complete  PCP or Specialist Appt within 5-7 Days Complete -  Home Care Screening Complete -  Medication Review (RN CM) Complete -  Some recent data might be hidden

## 2019-08-08 NOTE — TOC Progression Note (Signed)
Transition of Care [facility]) - Progression Note    Patient Details  Name: Kristy [name] MRN: [id number] Date of Birth: [birth year]  Transition of Care [facility]) CM/SW [place], [place] Phone Number: 08/08/2019, 3:36 PM  Clinical Narrative:   CSW presented bed offers to patient and niece at bedside. All 3 offers are out of county and niece is asking if [facility] can be contacted for possible admission. CSW contacted Magda Paganini at [facility], she states she will review referral. Patient and niece awaiting decision from [facility] before making a decision.     Expected Discharge Plan: [place] Barriers to Discharge: Continued Medical Work up  Expected Discharge Plan and Services Expected Discharge Plan: [place] Choice: [place] arrangements for the past 2 months: Single Family Home                                       Social Determinants of Health (SDOH) Interventions    Readmission Risk Interventions Readmission Risk Prevention Plan 04/28/2019 04/27/2019  Post Dischage Appt - Complete  Medication Screening - Complete  Transportation Screening - Complete  PCP or Specialist Appt within 5-7 Days Complete -  Home Care Screening Complete -  Medication Review (RN CM) Complete -  Some recent data might be hidden

## 2019-08-08 NOTE — Evaluation (Signed)
Physical Therapy Evaluation Patient Details Name: Kristy [name] MRN: [id number] DOB: [birth year] Today's Date: 08/08/2019   History of Present Illness  Pt is a 68 y.o. female with a known history of multiple medical problems that will be mentioned below, including coronary artery disease, nonischemic cardiomyopathy, Crohn's disease and type 2 diabetes mellitus with history of DKA, presented to emergency room with acute onset of hematemesis and altered mental status with decreased responsiveness.  MD assessment includes: Poorly Controlled DM II, Acute metabolic encephalopathy, hypernatremia, hypmagnesemia, hypokalemia, sepsis with possible UTI, coffee-ground emesis, HFrEF, and abdominal pain.    Clinical Impression  Pt presented with deficits in strength, transfers, mobility, gait, balance, and activity tolerance.  Pt required min A with bed mobility and transfers with posterior instability upon initial stand.  Upon initiating gait pt required Mod A x 2 to prevent posterior LOB and is at a very high risk for falls.  Pt has 5 steps to enter her home as well as intermittent assistance available and would not be safe to return to her prior living situation at this time.  Pt will benefit from PT services in a SNF setting upon discharge to safely address above deficits for decreased caregiver assistance and eventual return to PLOF.      Follow Up Recommendations SNF    Equipment Recommendations  None recommended by PT    Recommendations for Other Services       Precautions / Restrictions Precautions Precautions: Fall Restrictions Weight Bearing Restrictions: No      Mobility  Bed Mobility Overal bed mobility: Needs Assistance Bed Mobility: Sit to Supine;Supine to Sit     Supine to sit: Min assist Sit to supine: Min assist   General bed mobility comments: Min A for stability/safety during bed mobility tasks  Transfers Overall transfer level: Needs assistance Equipment used:  Rolling walker (2 wheeled) Transfers: Sit to/from Stand Sit to Stand: Min assist         General transfer comment: Min A to prevent post LOB upon initial stand  Ambulation/Gait Ambulation/Gait assistance: Mod assist Gait Distance (Feet): 3 Feet Assistive device: Rolling walker (2 wheeled) Gait Pattern/deviations: Step-to pattern Gait velocity: decreased   General Gait Details: Mod A x 2 to prevent posterior LOB during amb  Stairs            Wheelchair Mobility    Modified Rankin (Stroke Patients Only)       Balance Overall balance assessment: Needs assistance Sitting-balance support: Single extremity supported;Feet supported Sitting balance-Leahy Scale: Fair     Standing balance support: Bilateral upper extremity supported Standing balance-Leahy Scale: Poor Standing balance comment: Posterior instability in standing requiring Mod A to prevent LOB                             Pertinent Vitals/Pain Pain Assessment: No/denies pain    Home Living Family/patient expects to be discharged to:: Private residence Living Arrangements: Alone Available Help at Discharge: Family;Available PRN/intermittently Type of Home: House Home Access: Stairs to enter Entrance Stairs-Rails: Can reach both Entrance Stairs-Number of Steps: 5 Home Layout: One level Home Equipment: Walker - 2 wheels;Cane - single point;Shower seat;Crutches;Grab bars - tub/shower      Prior Function Level of [place]: Independent         Comments: Pt reports Ind Amb without an AD, 20 falls in the last year but only 1 in the last 3 months after changing to a new type  of shoe, Ind with ADLs     Hand Dominance        Extremity/Trunk Assessment   Upper Extremity Assessment Upper Extremity Assessment: Generalized weakness    Lower Extremity Assessment Lower Extremity Assessment: Generalized weakness       Communication   Communication: No difficulties  Cognition  Arousal/Alertness: Awake/alert Behavior During Therapy: Impulsive Overall Cognitive Status: No family/caregiver present to determine baseline cognitive functioning                                 General Comments: Pt presented with occasional confusion and was impulsive during the session but able to follow commands well      General Comments      Exercises Total Joint Exercises Ankle Circles/Pumps: AROM;Both;10 reps;Strengthening Quad Sets: Both;10 reps;Strengthening Gluteal Sets: Strengthening;Both;10 reps Hip ABduction/ADduction: AROM;AAROM;Both;10 reps Straight Leg Raises: AROM;AAROM;Both;10 reps Long Arc Quad: Strengthening;Both;10 reps Knee Flexion: Strengthening;Both;10 reps Marching in Standing: AROM;Both;Standing;5 reps   Assessment/Plan    PT Assessment Patient needs continued PT services  PT Problem List Decreased strength;Decreased mobility;Decreased activity tolerance;Decreased balance;Decreased knowledge of use of DME;Decreased safety awareness       PT Treatment Interventions DME instruction;Therapeutic exercise;Gait training;Stair training;Functional mobility training;Therapeutic activities;Patient/family education;Balance training    PT Goals (Current goals can be found in the Care Plan section)  Acute Rehab PT Goals Patient Stated Goal: To get stronger PT Goal Formulation: With patient Time For Goal Achievement: 08/21/19 Potential to Achieve Goals: Good    Frequency Min 2X/week   Barriers to discharge Decreased caregiver support;Inaccessible home environment      Co-evaluation               AM-PAC PT "6 Clicks" Mobility  Outcome Measure Help needed turning from your back to your side while in a flat bed without using bedrails?: A Little Help needed moving from lying on your back to sitting on the side of a flat bed without using bedrails?: A Little Help needed moving to and from a bed to a chair (including a wheelchair)?: A  Lot Help needed standing up from a chair using your arms (e.g., wheelchair or bedside chair)?: A Lot Help needed to walk in hospital room?: A Lot Help needed climbing 3-5 steps with a railing? : Total 6 Click Score: 13    End of Session Equipment Utilized During Treatment: Gait belt Activity Tolerance: Patient tolerated treatment well Patient left: in chair;with call bell/phone within reach Nurse Communication: Mobility status PT Visit Diagnosis: Unsteadiness on feet (R26.81);History of falling (Z91.81);Difficulty in walking, not elsewhere classified (R26.2);Muscle weakness (generalized) (M62.81)    Time: 5809-9833 PT Time Calculation (min) (ACUTE ONLY): 28 min   Charges:   PT Evaluation $PT Eval Moderate Complexity: 1 Mod PT Treatments $Therapeutic Exercise: 8-22 mins        D. Scott Lempi Edwin PT, DPT 08/08/19, 1:20 PM

## 2019-08-09 LAB — TROPONIN I (HIGH SENSITIVITY)
Troponin I (High Sensitivity): 14 ng/L (ref <18)
Troponin I (High Sensitivity): 13 ng/L (ref <18)

## 2019-08-09 LAB — BASIC METABOLIC PANEL
Anion gap: 8 (ref 5–15)
BUN: 14 mg/dL (ref 8–23)
CO2: 22 mmol/L (ref 22–32)
Calcium: 8.1 mg/dL — ABNORMAL LOW (ref 8.9–10.3)
Chloride: 111 mmol/L (ref 98–111)
Creatinine, Ser: 0.75 mg/dL (ref 0.44–1.00)
GFR calc Af Amer: >60 mL/min (ref >60)
GFR calc non Af Amer: >60 mL/min (ref >60)
Glucose, Bld: 165 mg/dL — ABNORMAL HIGH (ref 70–99)
Potassium: 3.8 mmol/L (ref 3.5–5.1)
Sodium: 141 mmol/L (ref 135–145)

## 2019-08-09 LAB — CULTURE, BLOOD (ROUTINE X 2)

## 2019-08-09 LAB — CBC
HCT: 41.6 % (ref 36.0–46.0)
Hemoglobin: 13.9 g/dL (ref 12.0–15.0)
MCH: 28.8 pg (ref 26.0–34.0)
MCHC: 33.4 g/dL (ref 30.0–36.0)
MCV: 86.3 fL (ref 80.0–100.0)
Platelets: 110 10*3/uL — ABNORMAL LOW (ref 150–400)
RBC: 4.82 MIL/uL (ref 3.87–5.11)
RDW: 13.4 % (ref 11.5–15.5)
WBC: 11.4 10*3/uL — ABNORMAL HIGH (ref 4.0–10.5)
nRBC: 0 % (ref 0.0–0.2)

## 2019-08-09 LAB — GLUCOSE, CAPILLARY
Glucose-Capillary: 210 mg/dL — ABNORMAL HIGH (ref 70–99)
Glucose-Capillary: 164 mg/dL — ABNORMAL HIGH (ref 70–99)
Glucose-Capillary: 206 mg/dL — ABNORMAL HIGH (ref 70–99)
Glucose-Capillary: 188 mg/dL — ABNORMAL HIGH (ref 70–99)
Glucose-Capillary: 227 mg/dL — ABNORMAL HIGH (ref 70–99)
Glucose-Capillary: 154 mg/dL — ABNORMAL HIGH (ref 70–99)

## 2019-08-09 LAB — PHOSPHORUS: Phosphorus: 2 mg/dL — ABNORMAL LOW (ref 2.5–4.6)

## 2019-08-09 LAB — MAGNESIUM: Magnesium: 1.5 mg/dL — ABNORMAL LOW (ref 1.7–2.4)

## 2019-08-09 MED ORDER — POTASSIUM PHOSPHATES 15 MMOLE/5ML IV SOLN
30.0000 mmol | Freq: Once | INTRAVENOUS | Status: AC
  Administered 2019-08-09: 30 mmol via INTRAVENOUS
  Filled 2019-08-09: qty 10

## 2019-08-09 MED ORDER — MAGNESIUM SULFATE 2 GM/50ML IV SOLN
2.0000 g | Freq: Once | INTRAVENOUS | Status: AC
  Administered 2019-08-09: 2 g via INTRAVENOUS
  Filled 2019-08-09: qty 50

## 2019-08-09 MED ORDER — ALUM & MAG HYDROXIDE-SIMETH 200-200-20 MG/5ML PO SUSP
30.0000 mL | Freq: Once | ORAL | Status: AC
  Administered 2019-08-09: 30 mL via ORAL
  Filled 2019-08-09: qty 30

## 2019-08-09 MED ORDER — NITROGLYCERIN 0.4 MG SL SUBL: 0.4000 mg | SUBLINGUAL_TABLET | SUBLINGUAL | Status: DC | PRN
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
## 2019-08-10 LAB — CULTURE, BLOOD (ROUTINE X 2)
Culture: NO GROWTH
Special Requests: ADEQUATE

## 2019-08-10 LAB — CBC
HCT: 52.9 % — ABNORMAL HIGH (ref 36.0–46.0)
Hemoglobin: 16.5 g/dL — ABNORMAL HIGH (ref 12.0–15.0)
MCH: 28.8 pg (ref 26.0–34.0)
MCHC: 31.2 g/dL (ref 30.0–36.0)
MCV: 92.3 fL (ref 80.0–100.0)
Platelets: 131 10*3/uL — ABNORMAL LOW (ref 150–400)
RBC: 5.73 MIL/uL — ABNORMAL HIGH (ref 3.87–5.11)
RDW: 13.5 % (ref 11.5–15.5)
WBC: 13.3 10*3/uL — ABNORMAL HIGH (ref 4.0–10.5)
nRBC: 0 % (ref 0.0–0.2)

## 2019-08-10 LAB — GLUCOSE, CAPILLARY
Glucose-Capillary: 179 mg/dL — ABNORMAL HIGH (ref 70–99)
Glucose-Capillary: 192 mg/dL — ABNORMAL HIGH (ref 70–99)
Glucose-Capillary: 206 mg/dL — ABNORMAL HIGH (ref 70–99)
Glucose-Capillary: 265 mg/dL — ABNORMAL HIGH (ref 70–99)
Glucose-Capillary: 377 mg/dL — ABNORMAL HIGH (ref 70–99)
Glucose-Capillary: 69 mg/dL — ABNORMAL LOW (ref 70–99)
Glucose-Capillary: 89 mg/dL (ref 70–99)

## 2019-08-10 LAB — BASIC METABOLIC PANEL
Anion gap: 11 (ref 5–15)
BUN: 14 mg/dL (ref 8–23)
CO2: 22 mmol/L (ref 22–32)
Calcium: 9.1 mg/dL (ref 8.9–10.3)
Chloride: 108 mmol/L (ref 98–111)
Creatinine, Ser: 0.74 mg/dL (ref 0.44–1.00)
GFR calc Af Amer: >60 mL/min (ref >60)
GFR calc non Af Amer: >60 mL/min (ref >60)
Glucose, Bld: 209 mg/dL — ABNORMAL HIGH (ref 70–99)
Potassium: 4.7 mmol/L (ref 3.5–5.1)
Sodium: 141 mmol/L (ref 135–145)

## 2019-08-10 LAB — PHOSPHORUS: Phosphorus: 3.6 mg/dL (ref 2.5–4.6)

## 2019-08-10 LAB — MAGNESIUM: Magnesium: 2 mg/dL (ref 1.7–2.4)

## 2019-08-10 NOTE — Progress Notes (Addendum)
PROGRESS NOTE    Kristy [name]  TKZ:[id number] DOB: 22-May-1951 DOA: 08/04/2019 PCP: Kristy Loffler, MD      Brief Narrative:  Kristy [name] is a 68 y.o. F with HFrEF (LVEF 35-40% 04/2019, NICM),  LBBB, HTN, HLD, GERD, Crohn's disease, and poorly controlled DM who presented with decreased mentation, decreased oral intake, confusion, and vomiting what appeared to be coffee-ground material.  In the ER, coffee-ground emesis noted patient was tachycardic and hypotensive.  She was started on Protonix and given empiric PRBC's.  Further work-up showed glucose 1324, no acidosis.  She was started on insulin as well as empiric antibiotics and fluids and admitted.        Assessment & Plan:  HHNK Poorly Controlled DM II UDS negative.  In the absence of significant acidosis or anion gap elevation and in the setting of glucose over 1000, suspect this is likely HHNK.  Glucose well controlled -Continue Lantus 18 untis BID with resistant SSI  -Continue mealtime coverage with 3 units TID  -carb modified diet   Acute metabolic encephalopathy In setting of HHNK, electrolyte disturbances, dehydration superimposed on age-related cognitive changes. CT head & UDS negative on admit.  B12, ammonia, TSH normal.    Mentation significantly improved. Encephalopathy resolved but she clearly has significant cognitive impairment / impaired decision making capacity. See discussion below regarding decision making capacity.  -PT/OT -OOB with assist, ambulate  -Continue prozac  -will need PSY & neurocognitive testing as outpatient    Hypernatremia Hypomagnesemia Hypokalemia  Hypophosphatemia  Mag, K, Phos normal today. -monitor electrolytes, replace as indicated   Suspected UTI, R/O Other Etiology  Initially suspected possible sepsis from UTI on admission and completed 5 days ceftriaxone but urine culture negative, now feel this has been ruled out.  Cultures of blood with 1/2 coag-neg staph (likely  contaminant).  PCT negative.  Doubt infectious etiology of presentation.   AKI on CKD 3 Lactic Acidosis  Hypernatremia / Hyperchloremia  Sr Cr improved to baseline and now stable/resolved.  Sodium resolved, mentation more alert. -encourage PO intake -follow BMP / UOP  -avoid nephrotoxic agents   Possible Coffee-Ground Emesis  Screening colonoscopy in 10/2017 per Dr. Carlean [name], diverticulosis & 1 rectal polyp noted / otherwise normal. She was treated initially as GIB with emergent PRBC and PPI gtt. Hgb normal on admit & has remained stable throughout course. Pt on plavix at baseline. There is doubt about whether this was actually a GI bleed (hgb has remained stable through hospitalization, no clinical hematemesis or melena ever noted here).  GI s/o 11/5.  No new bleeding. -Continue pantoprazole  HFrEF 04/2019 EF 35-40%, LHC with stable appearance of coronary arteries, mild to moderate non-obstructive disease to LAD, LCx, mildly elevated LV filling pressure.  Blood pressure normal, appears euvolemic -Continue Entresto, Spironolactone, Ivabradine, Coreg, ASA & Plavix  -follow up with Cardiology as outpatient   Arm bruises Patient relates that [name] step-daughter Kristy [name] off the porch, and she believes three Kristy [name] saw this, and also that the bruises on [name] arm were related.  These bruises are on the bicep and AC fossa and the forearm.  These are all on the inner arm. -SW consulted 11/7  Thrombocytopenia Platelets stable, no bleeding -Follow-up platelets  Tooth Loss  Noted on 11/7, patient missing left upper #10 tooth. No bleeding. Pt reports it "just fell out, has happened a lot over the years with diabetes" -outpatient dental follow up  -family aware of findings  DVT prophylaxis: SCD's Code Status: Full Code  Family Communication: Niece Kristy [name] updated via phone 11/8.      Consultants:   GI   PCCM   Procedures:   none  Diagnostic Tests:  CT Head  11/3 >> negative for acute process   KUB 11/4 >> negative   Cultures  COVID 11/2 >> negative   BCx2 11/3 >> coag neg staph 1/2 (likely contaminant)    UC 11/3 >> negative   Antimicrobials:   Cefepime 11/2 >> 11/3   Vanco 11/2 >> 11/3, 11/4 >> 11/5  Ceftriaxone 11/3 >> 11/6   Subjective: Mild hypoglycemia overnight, normalized.  Heartburn, improved with Maalox.  No fever, respiratory distress, chest pain, vomiting, diarrhea, change in mentation.     Objective: Vitals:   08/10/19 0453 08/10/19 0500 08/10/19 0730 08/10/19 1534  BP: (!) 143/84  (!) 142/72 134/62  Pulse: 69  71 80  Resp: 20  17 18   Temp: (!) 97.5 F (36.4 C)  97.7 F (36.5 C) 98.5 F (36.9 C)  TempSrc:   Oral Oral  SpO2: 100%  100% 95%  Weight:  60.6 kg    Height:        Intake/Output Summary (Last 24 hours) at 08/10/2019 1705 Last data filed at 08/10/2019 0500 Gross per 24 hour  Intake 56.66 ml  Output 250 ml  Net -193.34 ml   Filed Weights   08/05/19 0630 08/09/19 0334 08/10/19 0500  Weight: 62 kg 62.1 kg 60.6 kg    Examination: General appearance: Elderly adult female, alert and in no acute distress.  Sitting in bed, interactive. HEENT: Anicteric, conjunctiva pink, lids and lashes normal. No nasal deformity, discharge, epistaxis.  Lips moist, teeth missing, otherwise dentition in good repair. OP normal, no oral lesions.   Skin: Warm and dry.  No suspicious rashes or lesions.  Bruising on the right forearm. Cardiac: RRR, no murmurs appreciated.  No LE edema.    Respiratory: Normal respiratory rate and rhythm.  CTAB without rales or wheezes. Abdomen: Abdomen soft.  No tenderness palpation or guarding. No ascites, distension, hepatosplenomegaly.   MSK: No deformities or effusions of the large joints of the upper or lower extremities bilaterally. Neuro: Awake and alert, interactive, oriented to hospital, situation. Naming is grossly intact, and the patient's recall, recent and remote, as well as  general fund of knowledge seem mildly impaired.  Muscle tone normal, without fasciculations.  Moves all extremities equally with generalized weakness and with normal coordination.  Marland Kitchen Speech fluent.    Psych: Sensorium intact and responding to questions, attention normal. Affect normal.  Judgment and insight appear impaired.       Data Reviewed: I have personally reviewed following labs and imaging studies:  CBC: Recent Labs  Lab 08/05/19 0057  08/05/19 1324 08/06/19 0459 08/07/19 0433 08/08/19 0458 08/09/19 0550 08/10/19 0459  WBC 26.7*  --  28.1* 23.5* 17.4* 13.9* 11.4* 13.3*  NEUTROABS 23.1*  --  23.1* 17.6*  --   --   --   --   HGB 16.8*   < > 15.0 15.5* 14.9 13.5 13.9 16.5*  HCT 56.1*   < > 45.2 47.6* 45.6 41.5 41.6 52.9*  MCV 97.4  --  89.5 89.5 89.1 89.4 86.3 92.3  PLT 318  --  217 180 143* 121* 110* 131*   < > = values in this interval not displayed.   Basic Metabolic Panel: Recent Labs  Lab 08/06/19 0459 08/07/19 0433 08/07/19 1959 08/08/19 0458 08/09/19 0550  08/10/19 0459  NA 155* 146*  --  141 141 141  K 3.7 2.8* 3.9 3.8 3.8 4.7  CL 121* 112*  --  111 111 108  CO2 24 24  --  23 22 22   GLUCOSE 239* 226*  --  104* 165* 209*  BUN 40* 23  --  14 14 14   CREATININE 1.41* 0.95  --  0.73 0.75 0.74  CALCIUM 9.1 8.2*  --  7.7* 8.1* 9.1  MG 1.7 1.6* 1.8 1.5* 1.5* 2.0  PHOS  --  1.2* 1.9* 1.5* 2.0* 3.6   GFR: Estimated Creatinine Clearance: 55.7 mL/min (by C-G formula based on SCr of 0.74 mg/dL). Liver Function Tests: Recent Labs  Lab 08/04/19 2057  AST 22  ALT 19  ALKPHOS 151*  BILITOT 1.1  PROT 8.7*  ALBUMIN 3.8   No results for input(s): LIPASE, AMYLASE in the last 168 hours. Recent Labs  Lab 08/07/19 1321  AMMONIA 16   Coagulation Profile: Recent Labs  Lab 08/04/19 2057 08/05/19 0125  INR 1.3* 1.3*   Cardiac Enzymes: No results for input(s): CKTOTAL, CKMB, CKMBINDEX, TROPONINI in the last 168 hours. BNP (last 3 results) No results for  input(s): PROBNP in the last 8760 hours. HbA1C: No results for input(s): HGBA1C in the last 72 hours. CBG: Recent Labs  Lab 08/10/19 0134 08/10/19 0451 08/10/19 0733 08/10/19 1131 08/10/19 1635  GLUCAP 89 206* 179* 192* 265*   Lipid Profile: No results for input(s): CHOL, HDL, LDLCALC, TRIG, CHOLHDL, LDLDIRECT in the last 72 hours. Thyroid Function Tests: No results for input(s): TSH, T4TOTAL, FREET4, T3FREE, THYROIDAB in the last 72 hours. Anemia Panel: No results for input(s): VITAMINB12, FOLATE, FERRITIN, TIBC, IRON, RETICCTPCT in the last 72 hours. Urine analysis:    Component Value Date/Time   COLORURINE YELLOW (A) 08/04/2019 2057   APPEARANCEUR CLOUDY (A) 08/04/2019 2057   LABSPEC 1.021 08/04/2019 2057   PHURINE 6.0 08/04/2019 2057   GLUCOSEU >=500 (A) 08/04/2019 2057   HGBUR SMALL (A) 08/04/2019 2057   HGBUR large 03/22/2010 1533   BILIRUBINUR NEGATIVE 08/04/2019 2057   KETONESUR 5 (A) 08/04/2019 2057   PROTEINUR 30 (A) 08/04/2019 2057   UROBILINOGEN 0.2 03/22/2010 1533   NITRITE NEGATIVE 08/04/2019 2057   LEUKOCYTESUR MODERATE (A) 08/04/2019 2057   Sepsis Labs: @LABRCNTIP (procalcitonin:4,lacticacidven:4)  ) Recent Results (from the past 240 hour(s))  SARS Coronavirus 2 by RT PCR (hospital order, performed in [facility] hospital lab) Nasopharyngeal Nasopharyngeal Swab     Status: None   Collection Time: 08/04/19  8:57 PM   Specimen: Nasopharyngeal Swab  Result Value Ref Range Status   SARS Coronavirus 2 NEGATIVE NEGATIVE Final    Comment: (NOTE) If result is NEGATIVE SARS-CoV-2 target nucleic acids are NOT DETECTED. The SARS-CoV-2 RNA is generally detectable in upper and lower  respiratory specimens during the acute phase of infection. The lowest  concentration of SARS-CoV-2 viral copies this assay can detect is 250  copies / mL. A negative result does not preclude SARS-CoV-2 infection  and should not be used as the sole basis for treatment or other   patient management decisions.  A negative result may occur with  improper specimen collection / handling, submission of specimen other  than nasopharyngeal swab, presence of viral mutation(s) within the  areas targeted by this assay, and inadequate number of viral copies  (<250 copies / mL). A negative result must be combined with clinical  observations, patient history, and epidemiological information. If result is POSITIVE SARS-CoV-2  target nucleic acids are DETECTED. The SARS-CoV-2 RNA is generally detectable in upper and lower  respiratory specimens dur ing the acute phase of infection.  Positive  results are indicative of active infection with SARS-CoV-2.  Clinical  correlation with patient history and other diagnostic information is  necessary to determine patient infection status.  Positive results do  not rule out bacterial infection or co-infection with other viruses. If result is PRESUMPTIVE POSTIVE SARS-CoV-2 nucleic acids MAY BE PRESENT.   A presumptive positive result was obtained on the submitted specimen  and confirmed on repeat testing.  While 2019 novel coronavirus  (SARS-CoV-2) nucleic acids may be present in the submitted sample  additional confirmatory testing may be necessary for epidemiological  and / or clinical management purposes  to differentiate between  SARS-CoV-2 and other Sarbecovirus currently known to infect humans.  If clinically indicated additional testing with an alternate test  methodology [phone]) is advised. The SARS-CoV-2 RNA is generally  detectable in upper and lower respiratory sp ecimens during the acute  phase of infection. The expected result is Negative. Fact Sheet for Patients:  StrictlyIdeas.no Fact Sheet for Healthcare Providers: BankingDealers.co.za This test is not yet approved or cleared by the [place] FDA and has been authorized for detection and/or diagnosis of SARS-CoV-2 by  FDA under an Emergency Use Authorization (EUA).  This EUA will remain in effect (meaning this test can be used) for the duration of the COVID-19 declaration under Section 564(b)(1) of the Act, 21 U.S.C. section 360bbb-3(b)(1), unless the authorization is terminated or revoked sooner. Performed at [facility], [address] [place], [place] [address]   Blood culture (routine x 2)     Status: None   Collection Time: 08/05/19 12:56 AM   Specimen: BLOOD  Result Value Ref Range Status   Specimen Description BLOOD BLOOD LEFT HAND  Final   Special Requests   Final    BOTTLES DRAWN AEROBIC AND ANAEROBIC Blood Culture adequate volume   Culture   Final    NO GROWTH 5 DAYS Performed at [facility], [address] [place], [place] [address]    Report Status 08/10/2019 FINAL  Final  Blood culture (routine x 2)     Status: Abnormal   Collection Time: 08/05/19 12:58 AM   Specimen: BLOOD  Result Value Ref Range Status   Specimen Description   Final    BLOOD BLOOD RIGHT FOREARM Performed at [facility], [address], [place], [place] [address]    Special Requests   Final    BOTTLES DRAWN AEROBIC AND ANAEROBIC Blood Culture results may not be optimal due to an inadequate volume of blood received in culture bottles Performed at [facility], [address], [place], [place] [address]    Culture  Setup Time   Final    GRAM POSITIVE COCCI ANAEROBIC BOTTLE ONLY CRITICAL RESULT CALLED TO, READ BACK BY AND VERIFIED WITH: Cullen ON 08/06/2019 SNG Performed at [facility], [address] [place], [place] [address]    Culture (A)  Final    STAPHYLOCOCCUS SPECIES (COAGULASE NEGATIVE) THE SIGNIFICANCE OF ISOLATING THIS ORGANISM FROM A SINGLE SET OF BLOOD CULTURES WHEN MULTIPLE SETS ARE DRAWN IS UNCERTAIN. PLEASE NOTIFY THE MICROBIOLOGY DEPARTMENT WITHIN ONE WEEK IF SPECIATION AND SENSITIVITIES ARE REQUIRED. Performed at [facility], [place] [address] [place], [place] [address]    Report Status 08/09/2019 FINAL  Final  MRSA PCR Screening  Status: None   Collection Time: 08/05/19 11:56 AM   Specimen: Nasopharyngeal  Result Value Ref Range Status   MRSA by PCR NEGATIVE NEGATIVE Final    Comment:        The GeneXpert MRSA Assay (FDA approved for NASAL specimens only), is one component of a comprehensive MRSA colonization surveillance program. It is not intended to diagnose MRSA infection nor to guide or monitor treatment for MRSA infections. Performed at [facility], [address] [place], [place] [address]   Urine Culture     Status: None   Collection Time: 08/05/19  4:10 PM   Specimen: Urine, Random  Result Value Ref Range Status   Specimen Description   Final    URINE, RANDOM Performed at [facility], [address] [place], [place] [address]    Special Requests   Final    NONE Performed at [facility], [address], [place], [place] [address]    Culture   Final    NO GROWTH Performed at [facility], [place] [address], [place], [place] [address]    Report Status 08/06/2019 FINAL  Final         Radiology Studies: No results found.      Scheduled Meds: . aspirin EC  81 mg Oral Daily  . carvedilol  12.5 mg Oral BID WC  . Chlorhexidine Gluconate Cloth  6 each Topical Daily  . clopidogrel  75 mg Oral Daily  . FLUoxetine  20 mg Oral Daily  . influenza vaccine adjuvanted  0.5 mL Intramuscular Tomorrow-1000  . insulin aspart  0-20 Units Subcutaneous Q4H  . insulin aspart  3 Units Subcutaneous TID WC  . insulin glargine  18 Units Subcutaneous BID  . ivabradine  5 mg Oral BID WC  . pantoprazole  40 mg Intravenous Q12H  . pneumococcal 23 valent vaccine  0.5 mL Intramuscular Tomorrow-1000  . Ensure Max Protein  11 oz Oral BID BM  . sacubitril-valsartan  1 tablet Oral BID  . spironolactone  25 mg Oral Daily   Continuous Infusions:    LOS:  6 days    Time spent: 25 minutes  Noe Gens, AG-ACNP-S [facility] 08/10/2019, 5:05 PM     Please page through AMION:  [email or web address] Password [email or web address] If 7PM-7AM, please contact night-coverage         Attending MD Note:  I have seen and examined the patient with NP student and agree with the note above which has been edited to reflect our agreed upon history, exam, and assessment/plan.    I have personally reviewed the orders for the patient, which were made under my direction.        [place]

## 2019-08-10 NOTE — Progress Notes (Signed)
Hypoglycemic Event  CBG: 69  Treatment: 4 oz juice/soda  Symptoms: None  Follow-up CBG: Time: 0134 CBG Result: 89  Possible Reasons for Event: Unknown  Comments/MD notified: Patient resting comfortably in bed at this time.   Earleen Reaper

## 2019-08-11 ENCOUNTER — Other Ambulatory Visit: Payer: Self-pay | Admitting: *Deleted

## 2019-08-11 LAB — PHOSPHORUS: Phosphorus: 2.4 mg/dL — ABNORMAL LOW (ref 2.5–4.6)

## 2019-08-11 LAB — BASIC METABOLIC PANEL
Anion gap: 9 (ref 5–15)
BUN: 21 mg/dL (ref 8–23)
CO2: 23 mmol/L (ref 22–32)
Calcium: 8.8 mg/dL — ABNORMAL LOW (ref 8.9–10.3)
Chloride: 108 mmol/L (ref 98–111)
Creatinine, Ser: 0.76 mg/dL (ref 0.44–1.00)
GFR calc Af Amer: >60 mL/min (ref >60)
GFR calc non Af Amer: >60 mL/min (ref >60)
Glucose, Bld: 56 mg/dL — ABNORMAL LOW (ref 70–99)
Potassium: 3.6 mmol/L (ref 3.5–5.1)
Sodium: 140 mmol/L (ref 135–145)

## 2019-08-11 LAB — MAGNESIUM: Magnesium: 1.6 mg/dL — ABNORMAL LOW (ref 1.7–2.4)

## 2019-08-11 LAB — GLUCOSE, CAPILLARY
Glucose-Capillary: 113 mg/dL — ABNORMAL HIGH (ref 70–99)
Glucose-Capillary: 126 mg/dL — ABNORMAL HIGH (ref 70–99)
Glucose-Capillary: 184 mg/dL — ABNORMAL HIGH (ref 70–99)
Glucose-Capillary: 214 mg/dL — ABNORMAL HIGH (ref 70–99)
Glucose-Capillary: 261 mg/dL — ABNORMAL HIGH (ref 70–99)
Glucose-Capillary: 276 mg/dL — ABNORMAL HIGH (ref 70–99)
Glucose-Capillary: 56 mg/dL — ABNORMAL LOW (ref 70–99)
Glucose-Capillary: 59 mg/dL — ABNORMAL LOW (ref 70–99)

## 2019-08-11 MED ORDER — INSULIN GLARGINE 100 UNIT/ML ~~LOC~~ SOLN: 16.0000 [IU] | Freq: Two times a day (BID) | SUBCUTANEOUS | 11 refills | Status: DC

## 2019-08-11 MED ORDER — GLUCERNA SHAKE PO LIQD: 237.0000 mL | Freq: Three times a day (TID) | ORAL | 0 refills | Status: DC

## 2019-08-11 MED ORDER — INSULIN ASPART 100 UNIT/ML ~~LOC~~ SOLN: 0.0000 [IU] | Freq: Three times a day (TID) | SUBCUTANEOUS | 11 refills | Status: DC

## 2019-08-11 MED ORDER — IVABRADINE HCL 5 MG PO TABS: ORAL_TABLET | ORAL | 1 refills | Status: DC

## 2019-08-11 MED ORDER — POTASSIUM PHOSPHATES 15 MMOLE/5ML IV SOLN
30.0000 mmol | Freq: Once | INTRAVENOUS | Status: AC
  Administered 2019-08-11: 30 mmol via INTRAVENOUS
  Filled 2019-08-11: qty 10

## 2019-08-11 MED ORDER — INSULIN GLARGINE 100 UNIT/ML ~~LOC~~ SOLN
16.0000 [IU] | Freq: Two times a day (BID) | SUBCUTANEOUS | Status: DC
  Administered 2019-08-11 – 2019-08-14 (×7): 16 [IU] via SUBCUTANEOUS
  Filled 2019-08-11 (×8): qty 0.16

## 2019-08-11 MED ORDER — GLUCERNA 1.5 CAL PO LIQD: 1000.0000 mL | ORAL | Status: DC

## 2019-08-11 MED ORDER — INSULIN ASPART 100 UNIT/ML ~~LOC~~ SOLN: 4.0000 [IU] | Freq: Three times a day (TID) | SUBCUTANEOUS | 11 refills | Status: DC

## 2019-08-11 MED ORDER — INSULIN ASPART 100 UNIT/ML ~~LOC~~ SOLN: 0.0000 [IU] | Freq: Every day | SUBCUTANEOUS | 11 refills | Status: DC

## 2019-08-11 MED ORDER — MAGNESIUM SULFATE 2 GM/50ML IV SOLN
2.0000 g | Freq: Once | INTRAVENOUS | Status: AC
  Administered 2019-08-11: 2 g via INTRAVENOUS
  Filled 2019-08-11: qty 50

## 2019-08-11 MED ORDER — INSULIN ASPART 100 UNIT/ML ~~LOC~~ SOLN
0.0000 [IU] | Freq: Three times a day (TID) | SUBCUTANEOUS | Status: DC
  Administered 2019-08-11: 3 [IU] via SUBCUTANEOUS
  Administered 2019-08-11: 5 [IU] via SUBCUTANEOUS
  Administered 2019-08-12 (×2): 11 [IU] via SUBCUTANEOUS
  Administered 2019-08-12: 8 [IU] via SUBCUTANEOUS
  Administered 2019-08-13: 5 [IU] via SUBCUTANEOUS
  Administered 2019-08-13 (×2): 15 [IU] via SUBCUTANEOUS
  Administered 2019-08-14 (×3): 11 [IU] via SUBCUTANEOUS
  Administered 2019-08-15: 15 [IU] via SUBCUTANEOUS
  Administered 2019-08-15: 11 [IU] via SUBCUTANEOUS
  Filled 2019-08-11 (×13): qty 1

## 2019-08-11 MED ORDER — PANTOPRAZOLE SODIUM 40 MG PO TBEC
40.0000 mg | DELAYED_RELEASE_TABLET | Freq: Every day | ORAL | Status: DC
  Administered 2019-08-11 – 2019-08-15 (×5): 40 mg via ORAL
  Filled 2019-08-11 (×5): qty 1

## 2019-08-11 MED ORDER — INSULIN ASPART 100 UNIT/ML ~~LOC~~ SOLN
4.0000 [IU] | Freq: Three times a day (TID) | SUBCUTANEOUS | Status: DC
  Administered 2019-08-11 – 2019-08-13 (×6): 4 [IU] via SUBCUTANEOUS
  Filled 2019-08-11 (×6): qty 1

## 2019-08-11 MED ORDER — GLUCERNA SHAKE PO LIQD
237.0000 mL | Freq: Three times a day (TID) | ORAL | Status: DC
  Administered 2019-08-12 (×3): 237 mL via ORAL

## 2019-08-11 MED ORDER — INSULIN ASPART 100 UNIT/ML ~~LOC~~ SOLN
0.0000 [IU] | Freq: Every day | SUBCUTANEOUS | Status: DC
  Administered 2019-08-11: 3 [IU] via SUBCUTANEOUS
  Administered 2019-08-12: 4 [IU] via SUBCUTANEOUS
  Administered 2019-08-13: 3 [IU] via SUBCUTANEOUS
  Filled 2019-08-11 (×3): qty 1

## 2019-08-11 NOTE — Progress Notes (Signed)
Occupational Therapy Treatment Patient Details Name: Kristy [name] MRN: [id number] DOB: 1951/02/13 Today's Date: 08/11/2019    History of present illness Pt is a 68 y.o. female with a known history of multiple medical problems that will be mentioned below, including coronary artery disease, nonischemic cardiomyopathy, Crohn's disease and type 2 diabetes mellitus with history of DKA, presented to emergency room with acute onset of hematemesis and altered mental status with decreased responsiveness.  MD assessment includes: Poorly Controlled DM II, Acute metabolic encephalopathy, hypernatremia, hypmagnesemia, hypokalemia, sepsis with possible UTI, coffee-ground emesis, HFrEF, and abdominal pain.   OT comments  Pt seen to work on safety and sequencing with LB dressing skills while sitting EOB with mod to max cues to redirect due to perseveration on family issues and hx of confusion.  Rec pt have a walker bag to help carry items to increase safety with mobility when using FWW and reviewed rec in AD catalog that was left with patient. Pt was motivated to regain independence with fluctuating confusion throughout session but appears to have improved slightly.  Pt to discharge to [facility] today to continue rehab and OT goals..  Follow Up Recommendations  SNF    Equipment Recommendations  3 in 1 bedside commode    Recommendations for Other Services      Precautions / Restrictions Precautions Precautions: Fall Restrictions Weight Bearing Restrictions: No LLE Weight Bearing: Weight bearing as tolerated       Mobility Bed Mobility                  Transfers                      Balance                                           ADL either performed or assessed with clinical judgement   ADL                                         General ADL Comments: Pt seen to work on safety and sequencing with LB dressing skills while sitting  EOB with mod to max cues to redirect due to perseveration on family issues and hx of confusion.  Rec pt have a walker bag to help carry items to increase safety with mobility when using FWW and reviewed rec in AD catalog that was left with patient.  Pt to discharge to [facility] today.     Vision Baseline Vision/History: Wears glasses Wears Glasses: Reading only Patient Visual Report: No change from baseline     Perception     Praxis      Cognition Arousal/Alertness: Awake/alert Behavior During Therapy: Anxious Overall Cognitive Status: History of cognitive impairments - at baseline                                 General Comments: Following cues but needs mod to max cues to redirect to task.        Exercises     Shoulder Instructions       General Comments      Pertinent Vitals/ Pain       Pain Assessment: No/denies pain  Home  Living                                          Prior Functioning/Environment              Frequency  Min 1X/week        Progress Toward Goals  OT Goals(current goals can now be found in the care plan section)  Progress towards OT goals: Progressing toward goals  Acute Rehab OT Goals Patient Stated Goal: to get stronger before moving to ALF OT Goal Formulation: With patient/family Time For Goal Achievement: 08/22/19 Potential to Achieve Goals: Good  Plan      Co-evaluation                 AM-PAC OT "6 Clicks" Daily Activity     Outcome Measure   Help from another person eating meals?: None Help from another person taking care of personal grooming?: None Help from another person toileting, which includes using toliet, bedpan, or urinal?: A Little Help from another person bathing (including washing, rinsing, drying)?: A Lot Help from another person to put on and taking off regular upper body clothing?: A Little Help from another person to put on and taking off regular lower body  clothing?: A Little 6 Click Score: 19    End of Session    OT Visit Diagnosis: Other abnormalities of gait and mobility (R26.89);Repeated falls (R29.6);Muscle weakness (generalized) (M62.81);Other symptoms and signs involving cognitive function   Activity Tolerance Patient tolerated treatment well   Patient Left in bed;with call bell/phone within reach;with bed alarm set   Nurse Communication          Time: 5465-0354 OT Time Calculation (min): 31 min  Charges: OT General Charges $OT Visit: 1 Visit OT Treatments $Self Care/Home Management : 23-37 mins  Chrys Racer, OTR/L, [place] ascom [phone] 08/11/19, 12:01 PM

## 2019-08-11 NOTE — Discharge Summary (Signed)
Physician Discharge Summary  Kristy [name] VZC:[id number] DOB: Oct 01, 1951 DOA: 08/04/2019  PCP: Kristy Loffler, MD  Admit date: 08/04/2019 Discharge date: 08/11/2019  Admitted From: Home Disposition: SNF  Recommendations for Outpatient Follow-up:  1. Follow up with PCP in 1-2 weeks after discharge from SNF 2. Please obtain BMP in one week to follow-up creatinine, electrolytes 3. Dr. Lorelei [name]: Please refer patient for Geriatrics evaluation for neuropsych testing for dementia 4. Dr. Lorelei [name]: Please obtain CBC in 1 month and if anemia worsening, refer for EGD to Dr. Bonna [name]      Home Health: TBD at SNF Equipment/Devices: TBD SNF  Discharge Condition: Good CODE STATUS: Full Diet recommendation: Cardiac, diabetic  Brief/Interim Summary: Kristy [name] is a 68 y.o. F with HFrEF (LVEF 35-40% 04/2019, NICM),  LBBB, HTN, HLD, GERD, Crohn's disease, and poorly controlled DM who presented with decreased mentation, decreased oral intake, confusion, and vomiting what appeared to be coffee-ground material.  In the ER, coffee-ground emesis noted patient was tachycardic and hypotensive.  She was started on Protonix and given empiric PRBC's.  Further work-up showed glucose 1324, no acidosis.  She was started on insulin as well as empiric antibiotics and fluids and admitted.           PRINCIPAL HOSPITAL DIAGNOSIS: Hypoglycemic hyperosmotic nonketotic diabetic coma    Discharge Diagnoses:   HHNK Poorly Controlled DM II Blood sugar 1300 at admission comatose, without significant ketosis or acidosis.  UDS negative.  Started on IV fluids, IV insulin, improved.  Transition to new subcutaneous insulin (off U500 and controlled well with Lantus/Novolog), regimen adjusted and stabilized.   Acute metabolic encephalopathy In setting of HHNK, electrolyte disturbances, dehydration superimposed on age-related cognitive changes. CT head & UDS negative on admit.  B12, ammonia, TSH normal.     The patient has ongoing cognitive impairment from her episode of HH NK, possibly superimposed on some more chronic memory loss.  We anticipate that with physical therapy and Occupational Therapy, adequate nutrition, and time, her cognition will improve.  If not she will need more formal evaluation by geriatrics with neuropsychiatric evaluation for dementia.  Hypernatremia Hypomagnesemia Hypokalemia  Hypophosphatemia  In the setting of HHN K.  Resolved with supplementation, fluids.  Suspected UTI, R/O Other Etiology  Initially suspected possible sepsis from UTI on admission and completed 5 days ceftriaxone but urine culture negative, now feel this was ruled out.  Cultures of blood with 1/2 coag-neg staph (likely contaminant).  PCT negative.  Doubt infectious etiology of presentation.   AKI on CKD 3 Lactic Acidosis  Hypernatremia / Hyperchloremia  Creatinine resolved to baseline.    Possible Coffee-Ground Emesis  There was some description of "coffee-ground" emesis at presentation.  The patient was treated with empiric blood transfusion, PPI at admission.  However her hemoglobin remained stable, she had no hematemesis or melena throughout her hospital stay here.  And GI did not recommend EGD.  Reasonable to take pantoprazole for 1 month then stop.  If hemoglobin decreases in the future, or if FOBT positive as an outpatient, reasonable to refer to GI.  Chronic systolic CHF At baseline, the patient has CHF with EF 35-40%, LHC with stable appearance of coronary arteries, mild to moderate non-obstructive disease to LAD, LCx, mildly elevated LV filling pressure.  Thrombocytopenia Mild, chronic.         Discharge Instructions  Discharge Instructions    Diet Carb Modified   Complete by: As directed    Increase activity slowly   Complete by: As directed  Allergies as of 08/11/2019      Reactions   Fish Oil Other (See Comments)   Gout   Glimepiride Other (See Comments)    REACTION: hypoglycemia   Guanfacine Hcl Other (See Comments)   REACTION: unspecified   Rosiglitazone Other (See Comments)   CHF      Medication List    STOP taking these medications   HumuLIN R U-500 KwikPen 500 UNIT/ML kwikpen Generic drug: insulin regular human CONCENTRATED   Insulin Syringe-Needle U-100 31G X 5/16" 0.5 ML Misc Commonly known as: B-D INS SYRINGE 0.5CC/31GX5/16     TAKE these medications   acetaminophen 325 MG tablet Commonly known as: TYLENOL Take 325-650 mg by mouth daily as needed for moderate pain or headache.   aspirin 81 MG EC tablet Take 1 tablet (81 mg total) by mouth daily.   atorvastatin 80 MG tablet Commonly known as: LIPITOR Take 1 tablet (80 mg total) by mouth daily at 6 PM.   carvedilol 12.5 MG tablet Commonly known as: COREG Take 1 tablet (12.5 mg total) by mouth 2 (two) times daily.   clopidogrel 75 MG tablet Commonly known as: PLAVIX Take 1 tablet (75 mg total) by mouth daily with breakfast.   Entresto 49-51 MG Generic drug: sacubitril-valsartan TAKE ONE TABLET TWICE DAILY STARTING SUNDAY 09/15/18 What changed: See the new instructions.   feeding supplement (GLUCERNA SHAKE) Liqd Take 237 mLs by mouth 3 (three) times daily with meals.   FLUoxetine 20 MG capsule Commonly known as: PROZAC Take 1 capsule (20 mg total) by mouth daily.   insulin aspart 100 UNIT/ML injection Commonly known as: novoLOG Inject 0-15 Units into the skin 3 (three) times daily with meals. CBG 70 - 120: 0 units CBG 121 - 150: 2 units CBG 151 - 200: 3 units CBG 201 - 250: 5 units CBG 251 - 300: 8 units CBG 301 - 350: 11 units CBG 351 - 400: 15 units   insulin aspart 100 UNIT/ML injection Commonly known as: novoLOG Inject 4 Units into the skin 3 (three) times daily with meals.   insulin aspart 100 UNIT/ML injection Commonly known as: novoLOG Inject 0-5 Units into the skin at bedtime. CBG 70 - 120: 0 units CBG 121 - 150: 0 units CBG 151 - 200: 0  units CBG 201 - 250: 2 units CBG 251 - 300: 3 units CBG 301 - 350: 4 units CBG 351 - 400: 5 units   insulin glargine 100 UNIT/ML injection Commonly known as: LANTUS Inject 0.16 mLs (16 Units total) into the skin 2 (two) times daily.   ivabradine 5 MG Tabs tablet Commonly known as: Corlanor TAKE ONE TABLET TWICE DAILY WITH MEALS What changed: See the new instructions.   OneTouch Ultra test strip Generic drug: glucose blood Use to check blood sugar up to 8 times a day.   spironolactone 25 MG tablet Commonly known as: ALDACTONE Take 1 tablet (25 mg total) by mouth daily.       Contact information for follow-up providers    Gollan, Kathlene November, MD. Schedule an appointment as soon as possible for a visit in 4 week(s).   Specialty: Cardiology Contact information: [place] [address] [phone]        Kristy Loffler, MD. Call.   Specialty: Family Medicine Why: Make an appointment to be seen in 1-2 weeks post discharge from rehab Contact information: [address] [place] [address] [phone]  Contact information for after-discharge care    [facility] .   Service: Skilled Nursing Contact information: [place] [phone]                 Allergies  Allergen Reactions  . Fish Oil Other (See Comments)    Gout  . Glimepiride Other (See Comments)    REACTION: hypoglycemia  . Guanfacine Hcl Other (See Comments)    REACTION: unspecified  . Rosiglitazone Other (See Comments)    CHF    Consultations:  Critical care  Gastroenterology     Procedures/Studies: Ct Head Wo Contrast  Result Date: 08/05/2019 CLINICAL DATA:  Altered level of consciousness. Metabolic encephalopathy. Diabetic ketoacidosis. EXAM: CT HEAD WITHOUT CONTRAST TECHNIQUE: Contiguous axial images were obtained from the base of the skull  through the vertex without intravenous contrast. COMPARISON:  April 19, 2019 FINDINGS: Brain: Mild diffuse atrophy is stable. There is no intracranial mass, hemorrhage, extra-axial fluid collection, or midline shift. There is mild patchy small vessel disease in the centra semiovale bilaterally, stable. No acute infarct is evident. Vascular: There is no hyperdense vessel. There is no appreciable vascular calcification. Skull: The bony calvarium appears intact. Sinuses/Orbits: There is mucosal thickening in several ethmoid air cells. Other visualized paranasal sinuses are clear. Orbits appear symmetric bilaterally. Other: Mastoid air cells are clear. IMPRESSION: Stable mild atrophy with mild periventricular small vessel disease. No acute infarct evident. No mass or hemorrhage. Mucosal thickening noted in several ethmoid air cells. Electronically Signed   By: Lowella Grip III M.D.   On: 08/05/2019 09:03   Dg Chest Portable 1 View  Result Date: 08/04/2019 CLINICAL DATA:  Altered mental status. EXAM: PORTABLE CHEST 1 VIEW COMPARISON:  04/23/2011 FINDINGS: Heart and mediastinal contours are within normal limits. No focal opacities or effusions. No acute bony abnormality. IMPRESSION: No active disease. Electronically Signed   By: Rolm Baptise M.D.   On: 08/04/2019 22:27   Dg Abd Portable 1v  Result Date: 08/06/2019 CLINICAL DATA:  Abdominal pain. EXAM: PORTABLE ABDOMEN - 1 VIEW COMPARISON:  CT abdomen pelvis dated May 14, 2018. FINDINGS: The bowel gas pattern is normal. No radio-opaque calculi or other significant radiographic abnormality are seen. No acute osseous abnormality. Old right inferior pubic ramus fracture. IMPRESSION: Negative. Electronically Signed   By: Titus Dubin M.D.   On: 08/06/2019 12:24       Subjective: The patient has no headache, chest pain, abdominal pain, dyspnea, nausea, vomiting.  She feels her thinking is a little bit confused still.  Discharge Exam: Vitals:    08/11/19 0749 08/11/19 1641  BP: 129/86 109/60  Pulse: 97 76  Resp: 19 20  Temp: 98.2 F (36.8 C) 98.2 F (36.8 C)  SpO2: 99% 98%   Vitals:   08/10/19 1943 08/11/19 0426 08/11/19 0749 08/11/19 1641  BP: 112/77 120/77 129/86 109/60  Pulse: 92 86 97 76  Resp: 20 18 19 20   Temp: 98.7 F (37.1 C) 98.1 F (36.7 C) 98.2 F (36.8 C) 98.2 F (36.8 C)  TempSrc: Oral Oral Oral   SpO2: 98% 96% 99% 98%  Weight:  63.8 kg    Height:        General: Pt is alert, awake, not in acute distress, lying in bed, interactive Cardiovascular: RRR, nl S1-S2, no murmurs appreciated.   No LE edema.   Respiratory: Normal respiratory rate and rhythm.  CTAB without rales or wheezes. Abdominal: Abdomen  soft and non-tender.  No distension or HSM.   Neuro/Psych: The patient is impulsive, tangential, and has trouble with goal directed thought process.  She has some paranoid-like thought content related to her finances, and family, however she is easily redirectable.        The results of significant diagnostics from this hospitalization (including imaging, microbiology, ancillary and laboratory) are listed below for reference.     Microbiology: Recent Results (from the past 240 hour(s))  SARS Coronavirus 2 by RT PCR (hospital order, performed in [facility] hospital lab) Nasopharyngeal Nasopharyngeal Swab     Status: None   Collection Time: 08/04/19  8:57 PM   Specimen: Nasopharyngeal Swab  Result Value Ref Range Status   SARS Coronavirus 2 NEGATIVE NEGATIVE Final    Comment: (NOTE) If result is NEGATIVE SARS-CoV-2 target nucleic acids are NOT DETECTED. The SARS-CoV-2 RNA is generally detectable in upper and lower  respiratory specimens during the acute phase of infection. The lowest  concentration of SARS-CoV-2 viral copies this assay can detect is 250  copies / mL. A negative result does not preclude SARS-CoV-2 infection  and should not be used as the sole basis for treatment or other  patient  management decisions.  A negative result may occur with  improper specimen collection / handling, submission of specimen other  than nasopharyngeal swab, presence of viral mutation(s) within the  areas targeted by this assay, and inadequate number of viral copies  (<250 copies / mL). A negative result must be combined with clinical  observations, patient history, and epidemiological information. If result is POSITIVE SARS-CoV-2 target nucleic acids are DETECTED. The SARS-CoV-2 RNA is generally detectable in upper and lower  respiratory specimens dur ing the acute phase of infection.  Positive  results are indicative of active infection with SARS-CoV-2.  Clinical  correlation with patient history and other diagnostic information is  necessary to determine patient infection status.  Positive results do  not rule out bacterial infection or co-infection with other viruses. If result is PRESUMPTIVE POSTIVE SARS-CoV-2 nucleic acids MAY BE PRESENT.   A presumptive positive result was obtained on the submitted specimen  and confirmed on repeat testing.  While 2019 novel coronavirus  (SARS-CoV-2) nucleic acids may be present in the submitted sample  additional confirmatory testing may be necessary for epidemiological  and / or clinical management purposes  to differentiate between  SARS-CoV-2 and other Sarbecovirus currently known to infect humans.  If clinically indicated additional testing with an alternate test  methodology [phone]) is advised. The SARS-CoV-2 RNA is generally  detectable in upper and lower respiratory sp ecimens during the acute  phase of infection. The expected result is Negative. Fact Sheet for Patients:  StrictlyIdeas.no Fact Sheet for Healthcare Providers: BankingDealers.co.za This test is not yet approved or cleared by the [place] FDA and has been authorized for detection and/or diagnosis of SARS-CoV-2 by FDA under  an Emergency Use Authorization (EUA).  This EUA will remain in effect (meaning this test can be used) for the duration of the COVID-19 declaration under Section 564(b)(1) of the Act, 21 U.S.C. section 360bbb-3(b)(1), unless the authorization is terminated or revoked sooner. Performed at [facility] [address] [place], [place] [address]   Blood culture (routine x 2)     Status: None   Collection Time: 08/05/19 12:56 AM   Specimen: BLOOD  Result Value Ref Range Status   Specimen Description BLOOD BLOOD LEFT HAND  Final   Special Requests  Final    BOTTLES DRAWN AEROBIC AND ANAEROBIC Blood Culture adequate volume   Culture   Final    NO GROWTH 5 DAYS Performed at [facility], [address] [place], [place] [address]    Report Status 08/10/2019 FINAL  Final  Blood culture (routine x 2)     Status: Abnormal   Collection Time: 08/05/19 12:58 AM   Specimen: BLOOD  Result Value Ref Range Status   Specimen Description   Final    BLOOD BLOOD RIGHT FOREARM Performed at [facility], [address], [place], [place] [address]    Special Requests   Final    BOTTLES DRAWN AEROBIC AND ANAEROBIC Blood Culture results may not be optimal due to an inadequate volume of blood received in culture bottles Performed at [facility], [address], [place], [place] [address]    Culture  Setup Time   Final    GRAM POSITIVE COCCI ANAEROBIC BOTTLE ONLY CRITICAL RESULT CALLED TO, READ BACK BY AND VERIFIED WITH: [place] 08/06/2019 SNG Performed at [facility], [address] [place], [place] [address]    Culture (A)  Final    STAPHYLOCOCCUS SPECIES (COAGULASE NEGATIVE) THE SIGNIFICANCE OF ISOLATING THIS ORGANISM FROM A SINGLE SET OF BLOOD CULTURES WHEN MULTIPLE SETS ARE DRAWN IS UNCERTAIN. PLEASE NOTIFY THE MICROBIOLOGY DEPARTMENT WITHIN ONE WEEK IF SPECIATION AND SENSITIVITIES ARE REQUIRED. Performed at [place], [place] [address] [place], [place] [address]    Report Status 08/09/2019 FINAL  Final  MRSA PCR Screening     Status: None   Collection Time: 08/05/19 11:56 AM   Specimen: Nasopharyngeal  Result Value Ref Range Status   MRSA by PCR NEGATIVE NEGATIVE Final    Comment:        The GeneXpert MRSA Assay (FDA approved for NASAL specimens only), is one component of a comprehensive MRSA colonization surveillance program. It is not intended to diagnose MRSA infection nor to guide or monitor treatment for MRSA infections. Performed at [facility], [address] [place], [place] [address]   Urine Culture     Status: None   Collection Time: 08/05/19  4:10 PM   Specimen: Urine, Random  Result Value Ref Range Status   Specimen Description   Final    URINE, RANDOM Performed at [facility], [address], [place], [place] [address]    Special Requests   Final    NONE Performed at [facility], [address] [place], [place] [address]    Culture   Final    NO GROWTH Performed at [facility], [place] [address], [place],  [address]    Report Status 08/06/2019 FINAL  Final     Labs: BNP (last 3 results) No results for input(s): BNP in the last 8760 hours. Basic Metabolic Panel: Recent Labs  Lab 08/07/19 0433 08/07/19 1959 08/08/19 0458 08/09/19 0550 08/10/19 0459 08/11/19 0446  NA 146*  --  141 141 141 140  K 2.8* 3.9 3.8 3.8 4.7 3.6  CL 112*  --  111 111 108 108  CO2 24  --  23 22 22 23   GLUCOSE 226*  --  104* 165* 209* 56*  BUN 23  --  14 14 14 21   CREATININE 0.95  --  0.73 0.75 0.74 0.76  CALCIUM 8.2*  --  7.7* 8.1* 9.1 8.8*  MG 1.6* 1.8 1.5* 1.5* 2.0 1.6*  PHOS 1.2*  1.9* 1.5* 2.0* 3.6 2.4*   Liver Function Tests: Recent Labs  Lab 08/04/19 2057  AST 22  ALT 19  ALKPHOS 151*  BILITOT 1.1  PROT 8.7*  ALBUMIN 3.8   No results for input(s): LIPASE, AMYLASE in the last 168 hours. Recent Labs  Lab 08/07/19 1321   AMMONIA 16   CBC: Recent Labs  Lab 08/05/19 0057  08/05/19 1324 08/06/19 0459 08/07/19 0433 08/08/19 0458 08/09/19 0550 08/10/19 0459  WBC 26.7*  --  28.1* 23.5* 17.4* 13.9* 11.4* 13.3*  NEUTROABS 23.1*  --  23.1* 17.6*  --   --   --   --   HGB 16.8*   < > 15.0 15.5* 14.9 13.5 13.9 16.5*  HCT 56.1*   < > 45.2 47.6* 45.6 41.5 41.6 52.9*  MCV 97.4  --  89.5 89.5 89.1 89.4 86.3 92.3  PLT 318  --  217 180 143* 121* 110* 131*   < > = values in this interval not displayed.   Cardiac Enzymes: No results for input(s): CKTOTAL, CKMB, CKMBINDEX, TROPONINI in the last 168 hours. BNP: Invalid input(s): POCBNP CBG: Recent Labs  Lab 08/11/19 0449 08/11/19 0528 08/11/19 0751 08/11/19 1157 08/11/19 1641  GLUCAP 59* 113* 276* 184* 214*   D-Dimer No results for input(s): DDIMER in the last 72 hours. Hgb A1c No results for input(s): HGBA1C in the last 72 hours. Lipid Profile No results for input(s): CHOL, HDL, LDLCALC, TRIG, CHOLHDL, LDLDIRECT in the last 72 hours. Thyroid function studies No results for input(s): TSH, T4TOTAL, T3FREE, THYROIDAB in the last 72 hours.  Invalid input(s): FREET3 Anemia work up No results for input(s): VITAMINB12, FOLATE, FERRITIN, TIBC, IRON, RETICCTPCT in the last 72 hours. Urinalysis    Component Value Date/Time   COLORURINE YELLOW (A) 08/04/2019 2057   APPEARANCEUR CLOUDY (A) 08/04/2019 2057   LABSPEC 1.021 08/04/2019 2057   PHURINE 6.0 08/04/2019 2057   GLUCOSEU >=500 (A) 08/04/2019 2057   HGBUR SMALL (A) 08/04/2019 2057   HGBUR large 03/22/2010 1533   BILIRUBINUR NEGATIVE 08/04/2019 2057   KETONESUR 5 (A) 08/04/2019 2057   PROTEINUR 30 (A) 08/04/2019 2057   UROBILINOGEN 0.2 03/22/2010 1533   NITRITE NEGATIVE 08/04/2019 2057   LEUKOCYTESUR MODERATE (A) 08/04/2019 2057   Sepsis Labs Invalid input(s): PROCALCITONIN,  WBC,  LACTICIDVEN Microbiology Recent Results (from the past 240 hour(s))  SARS Coronavirus 2 by RT PCR (hospital  order, performed in [place] hospital lab) Nasopharyngeal Nasopharyngeal Swab     Status: None   Collection Time: 08/04/19  8:57 PM   Specimen: Nasopharyngeal Swab  Result Value Ref Range Status   SARS Coronavirus 2 NEGATIVE NEGATIVE Final    Comment: (NOTE) If result is NEGATIVE SARS-CoV-2 target nucleic acids are NOT DETECTED. The SARS-CoV-2 RNA is generally detectable in upper and lower  respiratory specimens during the acute phase of infection. The lowest  concentration of SARS-CoV-2 viral copies this assay can detect is 250  copies / mL. A negative result does not preclude SARS-CoV-2 infection  and should not be used as the sole basis for treatment or other  patient management decisions.  A negative result may occur with  improper specimen collection / handling, submission of specimen other  than nasopharyngeal swab, presence of viral mutation(s) within the  areas targeted by this assay, and inadequate number of viral copies  (<250 copies / mL). A negative result must be combined with clinical  observations, patient history, and epidemiological information. If result is POSITIVE  SARS-CoV-2 target nucleic acids are DETECTED. The SARS-CoV-2 RNA is generally detectable in upper and lower  respiratory specimens dur ing the acute phase of infection.  Positive  results are indicative of active infection with SARS-CoV-2.  Clinical  correlation with patient history and other diagnostic information is  necessary to determine patient infection status.  Positive results do  not rule out bacterial infection or co-infection with other viruses. If result is PRESUMPTIVE POSTIVE SARS-CoV-2 nucleic acids MAY BE PRESENT.   A presumptive positive result was obtained on the submitted specimen  and confirmed on repeat testing.  While 2019 novel coronavirus  (SARS-CoV-2) nucleic acids may be present in the submitted sample  additional confirmatory testing may be necessary for epidemiological  and  / or clinical management purposes  to differentiate between  SARS-CoV-2 and other Sarbecovirus currently known to infect humans.  If clinically indicated additional testing with an alternate test  methodology [phone]) is advised. The SARS-CoV-2 RNA is generally  detectable in upper and lower respiratory sp ecimens during the acute  phase of infection. The expected result is Negative. Fact Sheet for Patients:  StrictlyIdeas.no Fact Sheet for Healthcare Providers: BankingDealers.co.za This test is not yet approved or cleared by the [place] FDA and has been authorized for detection and/or diagnosis of SARS-CoV-2 by FDA under an Emergency Use Authorization (EUA).  This EUA will remain in effect (meaning this test can be used) for the duration of the COVID-19 declaration under Section 564(b)(1) of the Act, 21 U.S.C. section 360bbb-3(b)(1), unless the authorization is terminated or revoked sooner. Performed at [facility], [address] [place], [place] [address]   Blood culture (routine x 2)     Status: None   Collection Time: 08/05/19 12:56 AM   Specimen: BLOOD  Result Value Ref Range Status   Specimen Description BLOOD BLOOD LEFT HAND  Final   Special Requests   Final    BOTTLES DRAWN AEROBIC AND ANAEROBIC Blood Culture adequate volume   Culture   Final    NO GROWTH 5 DAYS Performed at [facility], [address], [place], [place] [address]    Report Status 08/10/2019 FINAL  Final  Blood culture (routine x 2)     Status: Abnormal   Collection Time: 08/05/19 12:58 AM   Specimen: BLOOD  Result Value Ref Range Status   Specimen Description   Final    BLOOD BLOOD RIGHT FOREARM Performed at [facility], [address], [place], [place] [address]    Special Requests   Final    BOTTLES DRAWN AEROBIC AND ANAEROBIC Blood Culture results may not be optimal due to an inadequate volume of blood received  in culture bottles Performed at [facility], [address], [place], [place] [address]    Culture  Setup Time   Final    GRAM POSITIVE COCCI ANAEROBIC BOTTLE ONLY CRITICAL RESULT CALLED TO, READ BACK BY AND VERIFIED WITH: [place] ON 08/06/2019 SNG Performed at [facility], [address] [place], [place] [address]    Culture (A)  Final    STAPHYLOCOCCUS SPECIES (COAGULASE NEGATIVE) THE SIGNIFICANCE OF ISOLATING THIS ORGANISM FROM A SINGLE SET OF BLOOD CULTURES WHEN MULTIPLE SETS ARE DRAWN IS UNCERTAIN. PLEASE NOTIFY THE MICROBIOLOGY DEPARTMENT WITHIN ONE WEEK IF SPECIATION AND SENSITIVITIES ARE REQUIRED. Performed at [facility], [place] [address], [place], [place] [address]    Report Status 08/09/2019 FINAL  Final  MRSA PCR Screening  Status: None   Collection Time: 08/05/19 11:56 AM   Specimen: Nasopharyngeal  Result Value Ref Range Status   MRSA by PCR NEGATIVE NEGATIVE Final    Comment:        The GeneXpert MRSA Assay (FDA approved for NASAL specimens only), is one component of a comprehensive MRSA colonization surveillance program. It is not intended to diagnose MRSA infection nor to guide or monitor treatment for MRSA infections. Performed at [facility] [facility], [address], [place], [place] [address]   Urine Culture     Status: None   Collection Time: 08/05/19  4:10 PM   Specimen: Urine, Random  Result Value Ref Range Status   Specimen Description   Final    URINE, RANDOM Performed at [facility], [address], [employer], [place] [address]    Special Requests   Final    NONE Performed at [facility], [address], [place], [place] [address]    Culture   Final    NO GROWTH Performed at [facility], [place] [address] [place],  [address]    Report Status 08/06/2019 FINAL  Final     Time coordinating discharge: 40 minutes      SIGNED:   Edwin Dada,  MD  [facility] 08/11/2019, 6:55 PM

## 2019-08-11 NOTE — Care Management Important Message (Signed)
Important Message  Patient Details  Name: Kristy [name] MRN: [id number] Date of Birth: [birth year]   Medicare Important Message Given:  Yes     [name] 08/11/2019, 11:57 AM

## 2019-08-11 NOTE — TOC Progression Note (Signed)
Transition of Care [facility]) - Progression Note    Patient Details  Name: Kristy [name] MRN: [id number] Date of Birth: November 24, 1950  Transition of Care [facility]) CM/SW Contact  [name], [place] Phone Number: 08/11/2019, 2:33 PM  Clinical Narrative:     CSW contacted [facility], and they are still waiting for insurance authorization.  CSW to continue to follow patient's progress throughout discharge planning.   Expected Discharge Plan: Skilled Nursing Facility Barriers to Discharge: Continued Medical Work up  Expected Discharge Plan and Services Expected Discharge Plan: [place] Choice: [place] arrangements for the past 2 months: Single Family Home Expected Discharge Date: 08/11/19                                     Social Determinants of Health (SDOH) Interventions    Readmission Risk Interventions Readmission Risk Prevention Plan 04/28/2019 04/27/2019  Post Dischage Appt - Complete  Medication Screening - Complete  Transportation Screening - Complete  PCP or Specialist Appt within 5-7 Days Complete -  Home Care Screening Complete -  Medication Review (RN CM) Complete -  Some recent data might be hidden

## 2019-08-11 NOTE — Progress Notes (Signed)
Physical Therapy Treatment Patient Details Name: Kristy [name] MRN: [id number] DOB: 06/05/1951 Today's Date: 08/11/2019    History of Present Illness Pt is a 68 y.o. female with a known history of multiple medical problems that will be mentioned below, including coronary artery disease, nonischemic cardiomyopathy, Crohn's disease and type 2 diabetes mellitus with history of DKA, presented to emergency room with acute onset of hematemesis and altered mental status with decreased responsiveness.  MD assessment includes: Poorly Controlled DM II, Acute metabolic encephalopathy, hypernatremia, hypmagnesemia, hypokalemia, sepsis with possible UTI, coffee-ground emesis, HFrEF, and abdominal pain.    PT Comments    Pt in bed upon entry, awake, alert, and smiling, interactive, but tangential at times. Pt perseverating on familiar relationships PTA. Pt moving better in general, able to transfer and AMB small distances with absolute need for UE support. Pt tolerates only 10-15 seconds standing without LOB posteriorly, but with RW can AMB slowly at limited household distances. Pt is easily distracted and tangential during walking and is redirected a few times reminding pt to attend to task and remain safe. Pt progressing steadily, but general unsafe with supervision.   Follow Up Recommendations  SNF     Equipment Recommendations  None recommended by PT    Recommendations for Other Services OT consult     Precautions / Restrictions Precautions Precautions: Fall Restrictions LLE Weight Bearing: Weight bearing as tolerated    Mobility  Bed Mobility               General bed mobility comments: seated at EOB upon entry.  Transfers Overall transfer level: Needs assistance Equipment used: Rolling walker (2 wheeled) Transfers: Sit to/from Stand Sit to Stand: Supervision         General transfer comment: able to balance without UE support for 10-15seconds at a time, but easy posterior  LOB  Ambulation/Gait Ambulation/Gait assistance: Min guard Gait Distance (Feet): 82 Feet Assistive device: Rolling walker (2 wheeled) Gait Pattern/deviations: Step-to pattern Gait velocity: 0.29ms   General Gait Details: 2-point step-to pattern with RW, easily distracted, dual tasking via conversation creates qualitative changes to AMB quality and speed. Pt has limited safety awareness adn requires redirecting to attend to task for safety .   Stairs             Wheelchair Mobility    Modified Rankin (Stroke Patients Only)       Balance           Standing balance support: During functional activity;No upper extremity supported Standing balance-Leahy Scale: Fair Standing balance comment: Posterior instability in standing requiring Mod A to prevent LOB                            Cognition Arousal/Alertness: Awake/alert Behavior During Therapy: Impulsive;Restless(freqeunt redirection required.)                                   General Comments: Following cues but needs mod to max cues to redirect to task.      Exercises      General Comments        Pertinent Vitals/Pain Pain Assessment: No/denies pain    Home Living                      Prior Function            PT Goals (  current goals can now be found in the care plan section) Acute Rehab PT Goals Patient Stated Goal: to get stronger before moving to ALF PT Goal Formulation: With patient Time For Goal Achievement: 08/21/19 Potential to Achieve Goals: Good Progress towards PT goals: Progressing toward goals    Frequency    Min 2X/week      PT Plan Current plan remains appropriate    Co-evaluation              AM-PAC PT "6 Clicks" Mobility   Outcome Measure  Help needed turning from your back to your side while in a flat bed without using bedrails?: A Little Help needed moving from lying on your back to sitting on the side of a flat bed without  using bedrails?: A Little Help needed moving to and from a bed to a chair (including a wheelchair)?: A Little Help needed standing up from a chair using your arms (e.g., wheelchair or bedside chair)?: A Little Help needed to walk in hospital room?: A Little Help needed climbing 3-5 steps with a railing? : A Little 6 Click Score: 18    End of Session Equipment Utilized During Treatment: Gait belt Activity Tolerance: Patient tolerated treatment well;Patient limited by fatigue Patient left: with call bell/phone within reach;in bed;with bed alarm set Nurse Communication: Mobility status(IV infitrated on RUE) PT Visit Diagnosis: Unsteadiness on feet (R26.81);History of falling (Z91.81);Difficulty in walking, not elsewhere classified (R26.2);Muscle weakness (generalized) (M62.81)     Time: 1446-1500 PT Time Calculation (min) (ACUTE ONLY): 14 min  Charges:  $Therapeutic Exercise: 8-22 mins                     5:27 PM, 08/11/19 Etta Grandchild, PT, DPT Physical Therapist - [facility]  [phone] ([place])    Maylen Waltermire C 08/11/2019, 5:23 PM

## 2019-08-11 NOTE — Progress Notes (Signed)
Hypoglycemic Event  CBG: 56  Treatment: 4 oz juice/soda  Symptoms: Hungry  Follow-up CBG: Time: 3736 CBG Result: 59  Possible Reasons for Event: Unknown  Comments/MD notified: Switched soda to orange juice due to patient preference. Will recheck in 15-30 minutes.  Follow-up CBG: Time: 0528  CBG Result: 113  Kristy [name]

## 2019-08-11 NOTE — Progress Notes (Signed)
Inpatient Diabetes Program Recommendations  AACE/ADA: New Consensus Statement on Inpatient Glycemic Control (2015)  Target Ranges:  Prepandial:   less than 140 mg/dL      Peak postprandial:   less than 180 mg/dL (1-2 hours)      Critically ill patients:  140 - 180 mg/dL   Results for Kristy [name], Kristy [name] (MRN [id number]) as of 08/11/2019 07:09  Ref. Range 08/09/2019 08:34 08/09/2019 11:53 08/09/2019 17:39 08/09/2019 19:35  Glucose-Capillary Latest Ref Range: 70 - 99 mg/dL 206 (H) 188 (H) 227 (H) 154 (H)   Results for Kristy [name], Kristy [name] (MRN [id number]) as of 08/11/2019 07:09  Ref. Range 08/10/2019 07:33 08/10/2019 11:31 08/10/2019 16:35 08/10/2019 19:43  Glucose-Capillary Latest Ref Range: 70 - 99 mg/dL 179 (H) 192 (H) 265 (H) 377 (H)   Results for Kristy [name], Kristy [name] (MRN [id number]) as of 08/11/2019 07:09  Ref. Range 08/11/2019 04:25  Glucose-Capillary Latest Ref Range: 70 - 99 mg/dL 56 (L)    Home DM Meds: Humulin RU500 Insulin per SSI <200- 0 units 201-300- 20 units 300-400- 50 units 401-500- 90 units >500- 100 units  Current Orders: Lantus 18 units BID      Novolog Resistant Correction Scale/ SSI (0-20 units) Q4 hours      Novolog 3 units TID with meals      MD- Suspect pt's Hypoglycemia this AM may have been caused by the Novolog she received at [facility] (3 units).  May have also been influenced by the Lantus as well.  Please consider the following:  1. Reduce Lantus slightly to 16 units BID  2. Reduce and Change current Novolog SSI regimen to: Moderate scale (0-15 units) TID AC + HS (currently ordered 0-20 scale q4 hours)  3. Increase Novolog Meal Coverage slightly to: Novolog 5 units TID with meals  (Please add the following Hold Parameters: Hold if pt eats <50% of meal, Hold if pt NPO)     --Will follow patient during  hospitalization--  Wyn Quaker RN, MSN, CDE Diabetes Coordinator Inpatient Glycemic Control Team Team Pager: [phone] (8a-5p)
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
## 2019-08-12 ENCOUNTER — Other Ambulatory Visit: Payer: Self-pay | Admitting: Pharmacist

## 2019-08-12 ENCOUNTER — Other Ambulatory Visit: Payer: Medicare HMO

## 2019-08-12 ENCOUNTER — Other Ambulatory Visit: Payer: Self-pay | Admitting: *Deleted

## 2019-08-12 LAB — GLUCOSE, CAPILLARY
Glucose-Capillary: 282 mg/dL — ABNORMAL HIGH (ref 70–99)
Glucose-Capillary: 346 mg/dL — ABNORMAL HIGH (ref 70–99)
Glucose-Capillary: 302 mg/dL — ABNORMAL HIGH (ref 70–99)
Glucose-Capillary: 318 mg/dL — ABNORMAL HIGH (ref 70–99)

## 2019-08-12 LAB — BASIC METABOLIC PANEL
Anion gap: 8 (ref 5–15)
BUN: 16 mg/dL (ref 8–23)
CO2: 26 mmol/L (ref 22–32)
Calcium: 8.7 mg/dL — ABNORMAL LOW (ref 8.9–10.3)
Chloride: 107 mmol/L (ref 98–111)
Creatinine, Ser: 0.95 mg/dL (ref 0.44–1.00)
GFR calc Af Amer: >60 mL/min (ref >60)
GFR calc non Af Amer: >60 mL/min (ref >60)
Glucose, Bld: 234 mg/dL — ABNORMAL HIGH (ref 70–99)
Potassium: 4.3 mmol/L (ref 3.5–5.1)
Sodium: 141 mmol/L (ref 135–145)

## 2019-08-12 LAB — MAGNESIUM: Magnesium: 1.7 mg/dL (ref 1.7–2.4)

## 2019-08-12 LAB — PHOSPHORUS: Phosphorus: 3 mg/dL (ref 2.5–4.6)

## 2019-08-12 LAB — SARS CORONAVIRUS 2 (TAT 6-24 HRS): SARS Coronavirus 2: NEGATIVE

## 2019-08-12 NOTE — Progress Notes (Signed)
Joellen Jersey (niece) called and given an update on patient.
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
## 2019-08-12 NOTE — Progress Notes (Signed)
Dr. Loleta Books made aware of BP 96/57, will hold Coreg
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
## 2019-08-12 NOTE — Plan of Care (Signed)
  Problem: Education: Goal: Knowledge of General Education information will improve Description: Including pain rating scale, medication(s)/side effects and non-pharmacologic comfort measures Outcome: Progressing   Problem: Health Behavior/Discharge Planning: Goal: Ability to manage health-related needs will improve Outcome: Progressing   Problem: Clinical Measurements: Goal: Ability to maintain clinical measurements within normal limits will improve Outcome: Progressing Goal: Will remain free from infection Outcome: Progressing Note: Remains afebrile Goal: Diagnostic test results will improve Outcome: Progressing Note: K 4.3 this morning Goal: Respiratory complications will improve Outcome: Progressing Goal: Cardiovascular complication will be avoided Outcome: Progressing Note: No arrhythmias over night   Problem: Activity: Goal: Risk for activity intolerance will decrease Outcome: Progressing   Problem: Nutrition: Goal: Adequate nutrition will be maintained Outcome: Progressing   Problem: Coping: Goal: Level of anxiety will decrease Outcome: Progressing   Problem: Elimination: Goal: Will not experience complications related to bowel motility Outcome: Progressing Goal: Will not experience complications related to urinary retention Outcome: Progressing   Problem: Pain Managment: Goal: General experience of comfort will improve Outcome: Progressing   Problem: Safety: Goal: Ability to remain free from injury will improve Outcome: Progressing   Problem: Skin Integrity: Goal: Risk for impaired skin integrity will decrease Outcome: Progressing   Problem: Education: Goal: Ability to describe self-care measures that may prevent or decrease complications (Diabetes Survival Skills Education) will improve Outcome: Progressing Goal: Individualized Educational Video(s) Outcome: Progressing   Problem: Cardiac: Goal: Ability to maintain an adequate cardiac output will  improve Outcome: Progressing   Problem: Health Behavior/Discharge Planning: Goal: Ability to identify and utilize available resources and services will improve Outcome: Progressing Goal: Ability to manage health-related needs will improve Outcome: Progressing   Problem: Fluid Volume: Goal: Ability to achieve a balanced intake and output will improve Outcome: Progressing   Problem: Metabolic: Goal: Ability to maintain appropriate glucose levels will improve Outcome: Progressing   Problem: Nutritional: Goal: Maintenance of adequate nutrition will improve Outcome: Progressing Goal: Maintenance of adequate weight for body size and type will improve Outcome: Progressing   Problem: Respiratory: Goal: Will regain and/or maintain adequate ventilation Outcome: Progressing   Problem: Urinary Elimination: Goal: Ability to achieve and maintain adequate renal perfusion and functioning will improve Outcome: Progressing

## 2019-08-12 NOTE — Progress Notes (Signed)
PROGRESS NOTE    [name]  ZDG:[id number] DOB: [birth year] DOA: 08/04/2019 PCP: Owens Loffler, MD      Brief Narrative:  Kristy [name] is a 68 y.o. F with HFrEF (LVEF 35-40% 04/2019, NICM),  LBBB, HTN, HLD, GERD, Crohn's disease, and poorly controlled DM who presented with decreased mentation, decreased oral intake, confusion, and vomiting what appeared to be coffee-ground material.  In the ER, coffee-ground emesis noted patient was tachycardic and hypotensive.  She was started on Protonix and given empiric PRBC's.  Further work-up showed glucose 1324, no acidosis.  She was started on insulin as well as empiric antibiotics and fluids and admitted.        Assessment & Plan:  HHNK Poorly Controlled DM II See d/c summary -Continue Lantus twice daily -Continue mealtime insulin plus high-dose sliding scale   Acute metabolic encephalopathy -Continue Prozac    Hypernatremia Hypomagnesemia Hypokalemia  Hypophosphatemia  -Repeat BMP in 1 week   Possible Coffee-Ground Emesis  See d/c summary -Continue pantoprazole  HFrEF Stable, euvolemic -Continue Entresto, Spironolactone, Ivabradine, Coreg, ASA & Plavix   Decision making capacity The patient has had a downward trajectory in the last few months, but was competent for self cares and discharged home after her last two hospitalizations, able to care for self independently.    After this episode, she has had noticeable improvement in cognition, memory, but still has significant cognitive impairments, impulsiveness, poor awareness and problem solving skills, and will need skilled rehabilitation to regain independence.   Given her repeat admissions in a short interval, this is extra important.      Subjective: No new fever, chest pain, cough, vomiting, abdominal pain.  She is looking forward to rehab.  Objective: Vitals:   08/12/19 0746 08/12/19 1527 08/12/19 1934 08/12/19 1935  BP: (!) 128/54 (!) 96/57 (!)  112/51   Pulse: 81 83  72  Resp: 18 14  18   Temp: 99.1 F (37.3 C) (!) 97.3 F (36.3 C) 98.1 F (36.7 C) 98 F (36.7 C)  TempSrc: Oral Oral Oral Oral  SpO2: 98% 100%  97%  Weight:      Height:        Intake/Output Summary (Last 24 hours) at 08/12/2019 1959 Last data filed at 08/12/2019 1216 Gross per 24 hour  Intake 240 ml  Output 0 ml  Net 240 ml   Filed Weights   08/10/19 0500 08/11/19 0426 08/12/19 0322  Weight: 60.6 kg 63.8 kg 63.9 kg    Examination: General appearance: Older adult female, alert and in no acute distress.  Sitting in recliner Neuro: Awake and alert.  Speech is fluent, but her recent recall seems impaired, she is tangential, completes conversations, and has problems with executive function.  Moves all extremities with equal strength, normal coordination.      Psych: Sensorium intact and responding to questions, attention normal. Affect pleasant.  Judgment and insight appear impaired.       Data Reviewed: I have personally reviewed following labs and imaging studies:  CBC: Recent Labs  Lab 08/06/19 0459 08/07/19 0433 08/08/19 0458 08/09/19 0550 08/10/19 0459  WBC 23.5* 17.4* 13.9* 11.4* 13.3*  NEUTROABS 17.6*  --   --   --   --   HGB 15.5* 14.9 13.5 13.9 16.5*  HCT 47.6* 45.6 41.5 41.6 52.9*  MCV 89.5 89.1 89.4 86.3 92.3  PLT 180 143* 121* 110* 951*   Basic Metabolic Panel: Recent Labs  Lab 08/08/19 0458 08/09/19 0550 08/10/19 0459 08/11/19 0446  08/12/19 0624  NA 141 141 141 140 141  K 3.8 3.8 4.7 3.6 4.3  CL 111 111 108 108 107  CO2 23 22 22 23 26   GLUCOSE 104* 165* 209* 56* 234*  BUN 14 14 14 21 16   CREATININE 0.73 0.75 0.74 0.76 0.95  CALCIUM 7.7* 8.1* 9.1 8.8* 8.7*  MG 1.5* 1.5* 2.0 1.6* 1.7  PHOS 1.5* 2.0* 3.6 2.4* 3.0   GFR: Estimated Creatinine Clearance: 51 mL/min (by C-G formula based on SCr of 0.95 mg/dL). Liver Function Tests: No results for input(s): AST, ALT, ALKPHOS, BILITOT, PROT, ALBUMIN in the last 168 hours.  No results for input(s): LIPASE, AMYLASE in the last 168 hours. Recent Labs  Lab 08/07/19 1321  AMMONIA 16   Coagulation Profile: No results for input(s): INR, PROTIME in the last 168 hours. Cardiac Enzymes: No results for input(s): CKTOTAL, CKMB, CKMBINDEX, TROPONINI in the last 168 hours. BNP (last 3 results) No results for input(s): PROBNP in the last 8760 hours. HbA1C: No results for input(s): HGBA1C in the last 72 hours. CBG: Recent Labs  Lab 08/11/19 1641 08/11/19 2113 08/12/19 0747 08/12/19 1149 08/12/19 1631  GLUCAP 214* 261* 282* 346* 302*   Lipid Profile: No results for input(s): CHOL, HDL, LDLCALC, TRIG, CHOLHDL, LDLDIRECT in the last 72 hours. Thyroid Function Tests: No results for input(s): TSH, T4TOTAL, FREET4, T3FREE, THYROIDAB in the last 72 hours. Anemia Panel: No results for input(s): VITAMINB12, FOLATE, FERRITIN, TIBC, IRON, RETICCTPCT in the last 72 hours. Urine analysis:    Component Value Date/Time   COLORURINE YELLOW (A) 08/04/2019 2057   APPEARANCEUR CLOUDY (A) 08/04/2019 2057   LABSPEC 1.021 08/04/2019 2057   PHURINE 6.0 08/04/2019 2057   GLUCOSEU >=500 (A) 08/04/2019 2057   HGBUR SMALL (A) 08/04/2019 2057   HGBUR large 03/22/2010 1533   BILIRUBINUR NEGATIVE 08/04/2019 2057   KETONESUR 5 (A) 08/04/2019 2057   PROTEINUR 30 (A) 08/04/2019 2057   UROBILINOGEN 0.2 03/22/2010 1533   NITRITE NEGATIVE 08/04/2019 2057   LEUKOCYTESUR MODERATE (A) 08/04/2019 2057   Sepsis Labs: @LABRCNTIP (procalcitonin:4,lacticacidven:4)  ) Recent Results (from the past 240 hour(s))  SARS Coronavirus 2 by RT PCR (hospital order, performed in [place] hospital lab) Nasopharyngeal Nasopharyngeal Swab     Status: None   Collection Time: 08/04/19  8:57 PM   Specimen: Nasopharyngeal Swab  Result Value Ref Range Status   SARS Coronavirus 2 NEGATIVE NEGATIVE Final    Comment: (NOTE) If result is NEGATIVE SARS-CoV-2 target nucleic acids are NOT DETECTED. The  SARS-CoV-2 RNA is generally detectable in upper and lower  respiratory specimens during the acute phase of infection. The lowest  concentration of SARS-CoV-2 viral copies this assay can detect is 250  copies / mL. A negative result does not preclude SARS-CoV-2 infection  and should not be used as the sole basis for treatment or other  patient management decisions.  A negative result may occur with  improper specimen collection / handling, submission of specimen other  than nasopharyngeal swab, presence of viral mutation(s) within the  areas targeted by this assay, and inadequate number of viral copies  (<250 copies / mL). A negative result must be combined with clinical  observations, patient history, and epidemiological information. If result is POSITIVE SARS-CoV-2 target nucleic acids are DETECTED. The SARS-CoV-2 RNA is generally detectable in upper and lower  respiratory specimens dur ing the acute phase of infection.  Positive  results are indicative of active infection with SARS-CoV-2.  Clinical  correlation  with patient history and other diagnostic information is  necessary to determine patient infection status.  Positive results do  not rule out bacterial infection or co-infection with other viruses. If result is PRESUMPTIVE POSTIVE SARS-CoV-2 nucleic acids MAY BE PRESENT.   A presumptive positive result was obtained on the submitted specimen  and confirmed on repeat testing.  While 2019 novel coronavirus  (SARS-CoV-2) nucleic acids may be present in the submitted sample  additional confirmatory testing may be necessary for epidemiological  and / or clinical management purposes  to differentiate between  SARS-CoV-2 and other Sarbecovirus currently known to infect humans.  If clinically indicated additional testing with an alternate test  methodology [phone]) is advised. The SARS-CoV-2 RNA is generally  detectable in upper and lower respiratory sp ecimens during the acute   phase of infection. The expected result is Negative. Fact Sheet for Patients:  StrictlyIdeas.no Fact Sheet for Healthcare Providers: BankingDealers.co.za This test is not yet approved or cleared by the [place] FDA and has been authorized for detection and/or diagnosis of SARS-CoV-2 by FDA under an Emergency Use Authorization (EUA).  This EUA will remain in effect (meaning this test can be used) for the duration of the COVID-19 declaration under Section 564(b)(1) of the Act, 21 U.S.C. section 360bbb-3(b)(1), unless the authorization is terminated or revoked sooner. Performed at [facility], [address] [place], [place] [address]   Blood culture (routine x 2)     Status: None   Collection Time: 08/05/19 12:56 AM   Specimen: BLOOD  Result Value Ref Range Status   Specimen Description BLOOD BLOOD LEFT HAND  Final   Special Requests   Final    BOTTLES DRAWN AEROBIC AND ANAEROBIC Blood Culture adequate volume   Culture   Final    NO GROWTH 5 DAYS Performed at [facility], [address] [place], [place] [address]    Report Status 08/10/2019 FINAL  Final  Blood culture (routine x 2)     Status: Abnormal   Collection Time: 08/05/19 12:58 AM   Specimen: BLOOD  Result Value Ref Range Status   Specimen Description   Final    BLOOD BLOOD RIGHT FOREARM Performed at [facility], [address] [place], [place] [address]    Special Requests   Final    BOTTLES DRAWN AEROBIC AND ANAEROBIC Blood Culture results may not be optimal due to an inadequate volume of blood received in culture bottles Performed at [facility], [address], [place], [place] [address]    Culture  Setup Time   Final    GRAM POSITIVE COCCI ANAEROBIC BOTTLE ONLY CRITICAL RESULT CALLED TO, READ BACK BY AND VERIFIED WITH: [place] ON 08/06/2019 SNG Performed at [facility], [address]  [place], [place] [address]    Culture (A)  Final    STAPHYLOCOCCUS SPECIES (COAGULASE NEGATIVE) THE SIGNIFICANCE OF ISOLATING THIS ORGANISM FROM A SINGLE SET OF BLOOD CULTURES WHEN MULTIPLE SETS ARE DRAWN IS UNCERTAIN. PLEASE NOTIFY THE MICROBIOLOGY DEPARTMENT WITHIN ONE WEEK IF SPECIATION AND SENSITIVITIES ARE REQUIRED. Performed at [facility], [place] [address], [place], [place] [address]    Report Status 08/09/2019 FINAL  Final  MRSA PCR Screening     Status: None   Collection Time: 08/05/19 11:56 AM   Specimen: Nasopharyngeal  Result Value Ref Range Status   MRSA by PCR NEGATIVE NEGATIVE Final    Comment:        The  GeneXpert MRSA Assay (FDA approved for NASAL specimens only), is one component of a comprehensive MRSA colonization surveillance program. It is not intended to diagnose MRSA infection nor to guide or monitor treatment for MRSA infections. Performed at [facility], [address], [place], [place] [address]   Urine Culture     Status: None   Collection Time: 08/05/19  4:10 PM   Specimen: Urine, Random  Result Value Ref Range Status   Specimen Description   Final    URINE, RANDOM Performed at [facility], [address] [place], [place] [address]    Special Requests   Final    NONE Performed at [facility], [address], [place], [place] [address]    Culture   Final    NO GROWTH Performed at [facility], [place] [address], [place], [place] [address]    Report Status 08/06/2019 FINAL  Final  SARS CORONAVIRUS 2 (TAT 6-24 HRS) Nasopharyngeal Nasopharyngeal Swab     Status: None   Collection Time: 08/11/19  6:24 PM   Specimen: Nasopharyngeal Swab  Result Value Ref Range Status   SARS Coronavirus 2 NEGATIVE NEGATIVE Final    Comment: (NOTE) SARS-CoV-2 target nucleic acids are NOT DETECTED. The SARS-CoV-2 RNA is generally detectable in upper and lower respiratory specimens during the acute phase of infection. Negative  results do not preclude SARS-CoV-2 infection, do not rule out co-infections with other pathogens, and should not be used as the sole basis for treatment or other patient management decisions. Negative results must be combined with clinical observations, patient history, and epidemiological information. The expected result is Negative. Fact Sheet for Patients: SugarRoll.be Fact Sheet for Healthcare Providers: https://www.woods-mathews.com/ This test is not yet approved or cleared by the [place] FDA and  has been authorized for detection and/or diagnosis of SARS-CoV-2 by FDA under an Emergency Use Authorization (EUA). This EUA will remain  in effect (meaning this test can be used) for the duration of the COVID-19 declaration under Section 56 4(b)(1) of the Act, 21 U.S.C. section 360bbb-3(b)(1), unless the authorization is terminated or revoked sooner. Performed at [facility], [place] [address] [place], [place] [address]          Radiology Studies: No results found.      Scheduled Meds: . aspirin EC  81 mg Oral Daily  . carvedilol  12.5 mg Oral BID WC  . Chlorhexidine Gluconate Cloth  6 each Topical Daily  . clopidogrel  75 mg Oral Daily  . feeding supplement (GLUCERNA SHAKE)  237 mL Oral TID WC  . FLUoxetine  20 mg Oral Daily  . influenza vaccine adjuvanted  0.5 mL Intramuscular Tomorrow-1000  . insulin aspart  0-15 Units Subcutaneous TID WC  . insulin aspart  0-5 Units Subcutaneous QHS  . insulin aspart  4 Units Subcutaneous TID WC  . insulin glargine  16 Units Subcutaneous BID  . ivabradine  5 mg Oral BID WC  . pantoprazole  40 mg Oral Daily  . pneumococcal 23 valent vaccine  0.5 mL Intramuscular Tomorrow-1000  . sacubitril-valsartan  1 tablet Oral BID  . spironolactone  25 mg Oral Daily   Continuous Infusions:    LOS: 8 days    Time spent: 15 minutes   Suann Larry      [facility]  08/12/2019, 7:59 PM     Please page through [place]:  [email or web address] Password [email or web address] If 7PM-7AM, please contact night-coverage

## 2019-08-12 NOTE — Patient Outreach (Signed)
Pt hospitalized 11/2-11/9/20 and transferred to skilled [place],  [place] CM closed case and faxed case closure letter to primary MD and mailed case closure letter to pt home.  Case closed  Jacqlyn Larsen [facility], [place] Coordinator [phone]  f

## 2019-08-12 NOTE — Patient Outreach (Signed)
[facility] [facility]) Care Management  [place]   08/12/2019  Kristy [name] Dec 06, 1950 [id number]  Patient referred to [place] for medication management related to uncontrolled diabetes. Patient recently hospitalized with HHNK, no acidosis, glucose 1300 on admission.  Patient's insulin regimen changed from U-500 to basal / bolus with Lantus / Novolog.  Patient discharged 11/9 to SNF for OT / PT.    Plan: -Will alert PCP office re: change in insulin regimen as patient was sent U-500 insulin to office through patient assistance program.   -Will close [place] case at this time and will await new referral from [facility] inpatient liaison / coordinator as needed upon discharge home from SNF.  Ralene Bathe, PharmD, [place] [phone]

## 2019-08-13 DIAGNOSIS — R531 Weakness: Secondary | ICD-10-CM

## 2019-08-13 DIAGNOSIS — Z0279 Encounter for issue of other medical certificate: Secondary | ICD-10-CM

## 2019-08-13 DIAGNOSIS — Z0189 Encounter for other specified special examinations: Secondary | ICD-10-CM

## 2019-08-13 DIAGNOSIS — E876 Hypokalemia: Secondary | ICD-10-CM

## 2019-08-13 LAB — GLUCOSE, CAPILLARY
Glucose-Capillary: 240 mg/dL — ABNORMAL HIGH (ref 70–99)
Glucose-Capillary: 389 mg/dL — ABNORMAL HIGH (ref 70–99)
Glucose-Capillary: 381 mg/dL — ABNORMAL HIGH (ref 70–99)
Glucose-Capillary: 239 mg/dL — ABNORMAL HIGH (ref 70–99)
Glucose-Capillary: 231 mg/dL — ABNORMAL HIGH (ref 70–99)

## 2019-08-13 MED ORDER — INSULIN ASPART 100 UNIT/ML ~~LOC~~ SOLN
5.0000 [IU] | Freq: Three times a day (TID) | SUBCUTANEOUS | Status: DC
  Administered 2019-08-13 – 2019-08-15 (×7): 5 [IU] via SUBCUTANEOUS
  Filled 2019-08-13 (×7): qty 1

## 2019-08-13 MED ORDER — INSULIN ASPART 100 UNIT/ML ~~LOC~~ SOLN: 5.0000 [IU] | Freq: Three times a day (TID) | SUBCUTANEOUS | 11 refills | Status: DC

## 2019-08-13 NOTE — Progress Notes (Signed)
Pt called out to nurse stating that her blood sugar was low and she requested some "cookies". Pt brought snack, but CBG was checked first. CBG was 240. She did not consume snack; plans to wait for breakfast.

## 2019-08-13 NOTE — Discharge Summary (Signed)
[facility] NAME: Kristy [name]    MR#:  [id number]  DATE OF BIRTH:  Apr 05, 1947  DATE OF ADMISSION:  08/04/2019 ADMITTING PHYSICIAN: Christel Mormon, MD  DATE OF DISCHARGE: 08/13/2019  PRIMARY CARE PHYSICIAN: Owens Loffler, MD    ADMISSION DIAGNOSIS:  AKI (acute kidney injury) ([place]) [N17.9] Hypotension, unspecified hypotension type [I95.9] Diabetic ketoacidosis without coma associated with diabetes mellitus due to underlying condition ([place]) [E08.10]  DISCHARGE DIAGNOSIS:  Active Problems:   DKA (diabetic ketoacidoses) ([place])   Hypotension   Hypernatremia   SECONDARY DIAGNOSIS:   Past Medical History:  Diagnosis Date  . Allergic rhinitis   . Allergy   . Cataract    mild   . Crohn's disease ([place]) 10/13/2013  . Diverticulosis   . GERD (gastroesophageal reflux disease)   . Gout   . HFrEF (heart failure with reduced ejection fraction) ([place])    a. 12/2008 Cath: EF 45% w/ inf HK; b. 07/2009 Echo: EF 50-55%; c. 08/2018 Echo: EF 20-25%.  Marland Kitchen Hyperlipidemia   . Hypertension   . IBS (irritable bowel syndrome)   . Left bundle branch block   . Neuromuscular disorder (H[facility])    neuropathy  . NICM (nonischemic cardiomyopathy) ([place])    a. 12/2008 Cath: no significant dzs, EF 45% w/ inf HK->Med Rx; b. 07/2009 Echo: EF 50-55%; c. 08/2018 Echo: EF 20-25%, ant/antsept HK, mild MR, mildly dil LA, nl RV fx; d. 08/2018 Cath: D1 80, otw nonobs dzs->Med rx.  . Non-obstructive CAD (coronary artery disease)    a. 12/2008 Cath: no significant dzs, EF 45% w/ inf HK->Med Rx; b. 08/2018 Cath: LM nl, LAD min irregs, D1 80, LCX 59md, RCA min irregs->Med Rx.  . Osteoarthritis   . Poorly controlled Diabetes mellitus    a. 07/2018 A1c 13.1.  .Marland KitchenSymptomatic cholelithiasis   . Uncontrolled type 2 diabetes mellitus with stage 3 chronic kidney disease, with long-term current use of insulin ([place]          HOSPITAL COURSE:   1.  Diabetic ketoacidosis on  presentation started on insulin drip and converted over to long-acting insulin with short acting prior to meals plus sliding scale.  Hemoglobin A1c 10.9. 2.  Sepsis on presentation with UTI.  Patient treated with 5 days of antibiotic.  Blood culture a contamination. 3.  Acute metabolic encephalopathy secondary to elevated blood sugars and sepsis.  This has improved. 4.  Possible coffee-ground emesis.  On PPI.  Seen by GI but they did not recommend doing any procedures at this time.  Can take Protonix for a month then stop. 5.  Chronic systolic congestive heart failure with EF 35 to 40%.  Continue Entresto, Coreg, Aldactone 6.  Acute kidney injury on chronic kidney disease stage II.  Creatinine 0.95 upon discharge. 7.  Electrolyte abnormalities with hypernatremia, hypomagnesemia, hypokalemia, hypophosphatemia.  These were replaced and improved during the hospital course. 8.  Thrombocytopenia.  Last platelet count 131. 9. Weakness, especially right leg bothersome to patient.  PT recommends rehab.  DISCHARGE CONDITIONS:   Satisfactory   DRUG ALLERGIES:   Allergies  Allergen Reactions  . Fish Oil Other (See Comments)    Gout  . Glimepiride Other (See Comments)    REACTION: hypoglycemia  . Guanfacine Hcl Other (See Comments)    REACTION: unspecified  . Rosiglitazone Other (See Comments)    CHF    DISCHARGE MEDICATIONS:   Allergies as of 08/13/2019  Reactions   Fish Oil Other (See Comments)   Gout   Glimepiride Other (See Comments)   REACTION: hypoglycemia   Guanfacine Hcl Other (See Comments)   REACTION: unspecified   Rosiglitazone Other (See Comments)   CHF      Medication List    STOP taking these medications   HumuLIN R U-500 KwikPen 500 UNIT/ML kwikpen Generic drug: insulin regular human CONCENTRATED   Insulin Syringe-Needle U-100 31G X 5/16" 0.5 ML Misc Commonly known as: B-D INS SYRINGE 0.5CC/31GX5/16     TAKE these medications   acetaminophen 325 MG  tablet Commonly known as: TYLENOL Take 325-650 mg by mouth daily as needed for moderate pain or headache.   aspirin 81 MG EC tablet Take 1 tablet (81 mg total) by mouth daily.   atorvastatin 80 MG tablet Commonly known as: LIPITOR Take 1 tablet (80 mg total) by mouth daily at 6 PM.   carvedilol 12.5 MG tablet Commonly known as: COREG Take 1 tablet (12.5 mg total) by mouth 2 (two) times daily.   clopidogrel 75 MG tablet Commonly known as: PLAVIX Take 1 tablet (75 mg total) by mouth daily with breakfast.   Entresto 49-51 MG Generic drug: sacubitril-valsartan TAKE ONE TABLET TWICE DAILY STARTING SUNDAY 09/15/18 What changed: See the new instructions.   feeding supplement (GLUCERNA SHAKE) Liqd Take 237 mLs by mouth 3 (three) times daily with meals.   FLUoxetine 20 MG capsule Commonly known as: PROZAC Take 1 capsule (20 mg total) by mouth daily.   insulin aspart 100 UNIT/ML injection Commonly known as: novoLOG Inject 0-15 Units into the skin 3 (three) times daily with meals. CBG 70 - 120: 0 units CBG 121 - 150: 2 units CBG 151 - 200: 3 units CBG 201 - 250: 5 units CBG 251 - 300: 8 units CBG 301 - 350: 11 units CBG 351 - 400: 15 units   insulin aspart 100 UNIT/ML injection Commonly known as: novoLOG Inject 0-5 Units into the skin at bedtime. CBG 70 - 120: 0 units CBG 121 - 150: 0 units CBG 151 - 200: 0 units CBG 201 - 250: 2 units CBG 251 - 300: 3 units CBG 301 - 350: 4 units CBG 351 - 400: 5 units   insulin aspart 100 UNIT/ML injection Commonly known as: novoLOG Inject 5 Units into the skin 3 (three) times daily with meals.   insulin glargine 100 UNIT/ML injection Commonly known as: LANTUS Inject 0.16 mLs (16 Units total) into the skin 2 (two) times daily.   ivabradine 5 MG Tabs tablet Commonly known as: Corlanor TAKE ONE TABLET TWICE DAILY WITH MEALS What changed: See the new instructions.   OneTouch Ultra test strip Generic drug: glucose blood Use to  check blood sugar up to 8 times a day.   spironolactone 25 MG tablet Commonly known as: ALDACTONE Take 1 tablet (25 mg total) by mouth daily.        DISCHARGE INSTRUCTIONS:   Follow-up at rehab 1 day Follow-up cardiology as outpatient  If you experience worsening of your admission symptoms, develop shortness of breath, life threatening emergency, suicidal or homicidal thoughts you must seek medical attention immediately by calling 911 or calling your MD immediately  if symptoms less severe.  You Must read complete instructions/literature along with all the possible adverse reactions/side effects for all the Medicines you take and that have been prescribed to you. Take any new Medicines after you have completely understood and accept all the possible adverse reactions/side effects.  Please note  You were cared for by a hospitalist during your hospital stay. If you have any questions about your discharge medications or the care you received while you were in the hospital after you are discharged, you can call the unit and asked to speak with the hospitalist on call if the hospitalist that took care of you is not available. Once you are discharged, your primary care physician will handle any further medical issues. Please note that NO REFILLS for any discharge medications will be authorized once you are discharged, as it is imperative that you return to your primary care physician (or establish a relationship with a primary care physician if you do not have one) for your aftercare needs so that they can reassess your need for medications and monitor your lab values.    Today   CHIEF COMPLAINT:   Chief Complaint  Patient presents with  . Hematemesis    HISTORY OF PRESENT ILLNESS:  Kristy [name]  is a 68 y.o. female found to have high sugars.   VITAL SIGNS:  Blood pressure (!) 95/47, pulse 87, temperature 97.9 F (36.6 C), resp. rate 19, height 5' 3"  (1.6 m), weight 64.2 kg,  SpO2 97 %.  I/O:    Intake/Output Summary (Last 24 hours) at 08/13/2019 0846 Last data filed at 08/13/2019 0104 Gross per 24 hour  Intake 240 ml  Output 0 ml  Net 240 ml    PHYSICAL EXAMINATION:  GENERAL:  68 y.o.-year-old patient lying in the bed with no acute distress.  EYES: Pupils equal, round, reactive to light and accommodation. No scleral icterus. Extraocular muscles intact.  HEENT: Head atraumatic, normocephalic. Oropharynx and nasopharynx clear.  NECK:  Supple, no jugular venous distention. No thyroid enlargement, no tenderness.  LUNGS: Normal breath sounds bilaterally, no wheezing, rales,rhonchi or crepitation. No use of accessory muscles of respiration.  CARDIOVASCULAR: S1, S2 normal. No murmurs, rubs, or gallops.  ABDOMEN: Soft, non-tender, non-distended. Bowel sounds present. No organomegaly or mass.  EXTREMITIES: No pedal edema, cyanosis, or clubbing.  NEUROLOGIC: Cranial nerves II through XII are intact. Sensation intact. Gait not checked.  PSYCHIATRIC: The patient is alert and oriented x 3.  SKIN: No obvious rash, lesion, or ulcer.   DATA REVIEW:   CBC Recent Labs  Lab 08/10/19 0459  WBC 13.3*  HGB 16.5*  HCT 52.9*  PLT 131*    Chemistries  Recent Labs  Lab 08/12/19 0624  NA 141  K 4.3  CL 107  CO2 26  GLUCOSE 234*  BUN 16  CREATININE 0.95  CALCIUM 8.7*  MG 1.7     Microbiology Results  Results for orders placed or performed during the hospital encounter of 08/04/19  SARS Coronavirus 2 by RT PCR (hospital order, performed in [facility] hospital lab) Nasopharyngeal Nasopharyngeal Swab     Status: None   Collection Time: 08/04/19  8:57 PM   Specimen: Nasopharyngeal Swab  Result Value Ref Range Status   SARS Coronavirus 2 NEGATIVE NEGATIVE Final    Comment: (NOTE) If result is NEGATIVE SARS-CoV-2 target nucleic acids are NOT DETECTED. The SARS-CoV-2 RNA is generally detectable in upper and lower  respiratory specimens during the acute  phase of infection. The lowest  concentration of SARS-CoV-2 viral copies this assay can detect is 250  copies / mL. A negative result does not preclude SARS-CoV-2 infection  and should not be used as the sole basis for treatment or other  patient management decisions.  A negative result may occur  with  improper specimen collection / handling, submission of specimen other  than nasopharyngeal swab, presence of viral mutation(s) within the  areas targeted by this assay, and inadequate number of viral copies  (<250 copies / mL). A negative result must be combined with clinical  observations, patient history, and epidemiological information. If result is POSITIVE SARS-CoV-2 target nucleic acids are DETECTED. The SARS-CoV-2 RNA is generally detectable in upper and lower  respiratory specimens dur ing the acute phase of infection.  Positive  results are indicative of active infection with SARS-CoV-2.  Clinical  correlation with patient history and other diagnostic information is  necessary to determine patient infection status.  Positive results do  not rule out bacterial infection or co-infection with other viruses. If result is PRESUMPTIVE POSTIVE SARS-CoV-2 nucleic acids MAY BE PRESENT.   A presumptive positive result was obtained on the submitted specimen  and confirmed on repeat testing.  While 2019 novel coronavirus  (SARS-CoV-2) nucleic acids may be present in the submitted sample  additional confirmatory testing may be necessary for epidemiological  and / or clinical management purposes  to differentiate between  SARS-CoV-2 and other Sarbecovirus currently known to infect humans.  If clinically indicated additional testing with an alternate test  methodology [phone]) is advised. The SARS-CoV-2 RNA is generally  detectable in upper and lower respiratory sp ecimens during the acute  phase of infection. The expected result is Negative. Fact Sheet for Patients:   StrictlyIdeas.no Fact Sheet for Healthcare Providers: BankingDealers.co.za This test is not yet approved or cleared by the [place] FDA and has been authorized for detection and/or diagnosis of SARS-CoV-2 by FDA under an Emergency Use Authorization (EUA).  This EUA will remain in effect (meaning this test can be used) for the duration of the COVID-19 declaration under Section 564(b)(1) of the Act, 21 U.S.C. section 360bbb-3(b)(1), unless the authorization is terminated or revoked sooner. Performed at [facility] [address] [place], [place] [address]   Blood culture (routine x 2)     Status: None   Collection Time: 08/05/19 12:56 AM   Specimen: BLOOD  Result Value Ref Range Status   Specimen Description BLOOD BLOOD LEFT HAND  Final   Special Requests   Final    BOTTLES DRAWN AEROBIC AND ANAEROBIC Blood Culture adequate volume   Culture   Final    NO GROWTH 5 DAYS Performed at [facility], [address], [place], [place] [address]    Report Status 08/10/2019 FINAL  Final  Blood culture (routine x 2)     Status: Abnormal   Collection Time: 08/05/19 12:58 AM   Specimen: BLOOD  Result Value Ref Range Status   Specimen Description   Final    BLOOD BLOOD RIGHT FOREARM Performed at [facility], [address], [place], [place] [address]    Special Requests   Final    BOTTLES DRAWN AEROBIC AND ANAEROBIC Blood Culture results may not be optimal due to an inadequate volume of blood received in culture bottles Performed at [facility], [address], [place], [place] [address]    Culture  Setup Time   Final    GRAM POSITIVE COCCI ANAEROBIC BOTTLE ONLY CRITICAL RESULT CALLED TO, READ BACK BY AND VERIFIED WITH: [place] ON 08/06/2019 SNG Performed at [facility], [address] [place], [place] [address]    Culture (A)  Final    STAPHYLOCOCCUS SPECIES (COAGULASE  NEGATIVE) THE SIGNIFICANCE OF ISOLATING THIS  ORGANISM FROM A SINGLE SET OF BLOOD CULTURES WHEN MULTIPLE SETS ARE DRAWN IS UNCERTAIN. PLEASE NOTIFY THE MICROBIOLOGY DEPARTMENT WITHIN ONE WEEK IF SPECIATION AND SENSITIVITIES ARE REQUIRED. Performed at [facility], [place] [address] [place], [place] [address]    Report Status 08/09/2019 FINAL  Final  MRSA PCR Screening     Status: None   Collection Time: 08/05/19 11:56 AM   Specimen: Nasopharyngeal  Result Value Ref Range Status   MRSA by PCR NEGATIVE NEGATIVE Final    Comment:        The GeneXpert MRSA Assay (FDA approved for NASAL specimens only), is one component of a comprehensive MRSA colonization surveillance program. It is not intended to diagnose MRSA infection nor to guide or monitor treatment for MRSA infections. Performed at [facility], [address] [place], [place] [address]   Urine Culture     Status: None   Collection Time: 08/05/19  4:10 PM   Specimen: Urine, Random  Result Value Ref Range Status   Specimen Description   Final    URINE, RANDOM Performed at [facility], [address], [place], [place] [address]    Special Requests   Final    NONE Performed at [facility], [address], [place], [place] [address]    Culture   Final    NO GROWTH Performed at [facility], [place] [address], [place], [place] [address]    Report Status 08/06/2019 FINAL  Final  SARS CORONAVIRUS 2 (TAT 6-24 HRS) Nasopharyngeal Nasopharyngeal Swab     Status: None   Collection Time: 08/11/19  6:24 PM   Specimen: Nasopharyngeal Swab  Result Value Ref Range Status   SARS Coronavirus 2 NEGATIVE NEGATIVE Final    Comment: (NOTE) SARS-CoV-2 target nucleic acids are NOT DETECTED. The SARS-CoV-2 RNA is generally detectable in upper and lower respiratory specimens during the acute phase of infection. Negative results do not preclude SARS-CoV-2 infection, do not rule out co-infections with  other pathogens, and should not be used as the sole basis for treatment or other patient management decisions. Negative results must be combined with clinical observations, patient history, and epidemiological information. The expected result is Negative. Fact Sheet for Patients: SugarRoll.be Fact Sheet for Healthcare Providers: https://www.woods-mathews.com/ This test is not yet approved or cleared by the [place] FDA and  has been authorized for detection and/or diagnosis of SARS-CoV-2 by FDA under an Emergency Use Authorization (EUA). This EUA will remain  in effect (meaning this test can be used) for the duration of the COVID-19 declaration under Section 56 4(b)(1) of the Act, 21 U.S.C. section 360bbb-3(b)(1), unless the authorization is terminated or revoked sooner. Performed at [facility], [place] [address] [place],  [address]       Management plans discussed with the patient, and she in agreement.  CODE STATUS:     Code Status Orders  (From admission, onward)         Start     Ordered   08/04/19 2310  Full code  Continuous     08/04/19 2313        Code Status History    Date Active Date Inactive Code Status Order ID Comments User Context   07/08/2019 2027 07/14/2019 1903 Full Code [id number]  Henreitta Leber, MD Inpatient   04/19/2019 2216 04/28/2019 1910 Full Code [id number]  Loletha Grayer, MD ED   08/26/2018 1151 08/26/2018 1736 Full Code [id number]  Wellington Hampshire,  MD Inpatient   05/14/2018 0401 05/15/2018 1421 Full Code [id number]  Lance Coon, MD Inpatient   Advance Care Planning Activity    Advance Directive Documentation     Most Recent Value  Type of Advance Directive  Healthcare Power of Attorney  Pre-existing out of facility DNR order (yellow form or pink MOST form)  -  "MOST" Form in Place?  -      TOTAL TIME TAKING CARE OF THIS PATIENT: 35 minutes.    Loletha Grayer M.D on  08/13/2019 at 8:46 AM  Between 7am to 6pm - Pager - [phone]  After 6pm go to [email or web address] - password [email or web address]  [facility]  CC: Primary care physician; Owens Loffler, MD

## 2019-08-13 NOTE — Consult Note (Signed)
[facility] Consult   Reason for Consult: Competency evaluation Referring Physician: Dr. Leslye Peer Patient Identification: Kristy [name] MRN:  [id number] Principal Diagnosis: <principal problem not specified> Diagnosis:  Active Problems:   AKI (acute kidney injury) ([place])   DKA (diabetic ketoacidoses) ([place])   Hypotension   Hypernatremia   Hypokalemia   Hypomagnesemia   Total Time spent with patient: 45 minutes  Subjective:   Kristy [name] is a 67 y.o. female patient admitted complications from diabetes.  HPI: Patient is a 68 year old female with a history of diabetes and no significant past psychiatric history whom psychiatry was consulted on for the purposes of capacity evaluation.  Per chart review patient was found at her home in a poor state of health.  Family was concerned about patient's ability to care for self, there were also some concerns for depression due to patient's loss of husband.  Patient was interviewed and is calm and compliant with Probation officer.  Patient reports that she looks forward to leaving the hospital as soon as possible and is willing to go to rehab if the insurance will pay for her stay.  Patient states that she understands that she will likely need rehab and might perhaps need surgery in the future if need be.  End-of-life care was also discussed with the patient who agreed that at some point she might need nursing home placement if her clinical condition deteriorates or she is no longer able to care for herself.  Patient denies any history of depression, psychiatric hospitalizations, suicide attempts or periods of psychosis.  Patient has never spoken to a psychiatrist before and denies any symptoms of depression, anxiety, schizophrenia, mania.  Patient reports that she did live a good life with her husband and that his death was well accepted being that he was given his prognosis approximately 6 years prior to his death.  Patient also mentions  that at 1 point her and her husband were considering going to [place] for her husband to have euthanasia.  She denied that his death was depressing to her or that she is currently depressed by his death.  Patient denied any social stressors.  She states that she is not working at this time and she is living off the money that her and her husband have retired on.  Patient states that this is a good sum of money and has left her more than comfortable.  Patient does cite some recent stressors with her family members about what will happen to the money when the patient dies.  Despite this patient is not distraught over this issue.  She remains future oriented, and committed to her health.  Patient understands that her diabetes is not is uncurable but treatable and she understands that she will have to use her insulin medication appropriately in order to avoid avoid long-term sequelae of the disease.    Past Psychiatric History: Denies past psychiatric history  Risk to Self:  No Risk to Others:  No Prior Inpatient Therapy:  No Prior Outpatient Therapy:  No  Past Medical History:  Past Medical History:  Diagnosis Date  . Allergic rhinitis   . Allergy   . Cataract    mild   . Crohn's disease ([place]) 10/13/2013  . Diverticulosis   . GERD (gastroesophageal reflux disease)   . Gout   . HFrEF (heart failure with reduced ejection fraction) ([place])    a. 12/2008 Cath: EF 45% w/ inf HK; b. 07/2009 Echo: EF 50-55%; c. 08/2018 Echo: EF  20-25%.  Marland Kitchen Hyperlipidemia   . Hypertension   . IBS (irritable bowel syndrome)   . Left bundle branch block   . Neuromuscular disorder ([facility])    neuropathy  . NICM (nonischemic cardiomyopathy) ([place])    a. 12/2008 Cath: no significant dzs, EF 45% w/ inf HK->Med Rx; b. 07/2009 Echo: EF 50-55%; c. 08/2018 Echo: EF 20-25%, ant/antsept HK, mild MR, mildly dil LA, nl RV fx; d. 08/2018 Cath: D1 80, otw nonobs dzs->Med rx.  . Non-obstructive CAD (coronary artery disease)    a. 12/2008  Cath: no significant dzs, EF 45% w/ inf HK->Med Rx; b. 08/2018 Cath: LM nl, LAD min irregs, D1 80, LCX 18md, RCA min irregs->Med Rx.  . Osteoarthritis   . Poorly controlled Diabetes mellitus    a. 07/2018 A1c 13.1.  .Marland KitchenSymptomatic cholelithiasis   . Uncontrolled type 2 diabetes mellitus with stage 3 chronic kidney disease, with long-term current use of insulin ([place]          Past Surgical History:  Procedure Laterality Date  . BREAST BIOPSY Right 06/24/2019   stereo bx/ x clip/ path pending  . BREAST CYST ASPIRATION    . COLONOSCOPY    . DILATION AND CURETTAGE OF UTERUS    . LEFT HEART CATH AND CORONARY ANGIOGRAPHY N/A 04/24/2019   Procedure: LEFT HEART CATH AND CORONARY ANGIOGRAPHY;  Surgeon: ENelva Bush MD;  [facility];  Service: Cardiovascular;  Laterality: N/A;  . RIGHT/LEFT HEART CATH AND CORONARY ANGIOGRAPHY N/A 08/26/2018   Procedure: RIGHT/LEFT HEART CATH AND CORONARY ANGIOGRAPHY;  Surgeon: AWellington Hampshire MD;  [facility];  Service: Cardiovascular;  Laterality: N/A;  . SHOULDER SURGERY     15 + yrs ago   . SKIN SURGERY     nose   . VAGINAL HYSTERECTOMY     Family History:  Family History  Problem Relation Age of Onset  . Kidney failure Mother   . Hypertension Father   . Kidney failure Brother   . Colon cancer Neg Hx   . Colon polyps Neg Hx   . Rectal cancer Neg Hx   . Stomach cancer Neg Hx   . Breast cancer Neg Hx    Family Psychiatric  History: Denies  social History:  Social History   Substance and Sexual Activity  Alcohol Use No     Social History   Substance and Sexual Activity  Drug Use No    Social History   Socioeconomic History  . Marital status: Widowed    Spouse name: Not on file  . Number of children: Not on file  . Years of education: Not on file  . Highest education level: Not on file  Occupational History  . Not on file  Social Needs  . Financial resource strain: Not on file  . Food  insecurity    Worry: Not on file    Inability: Not on file  . Transportation needs    Medical: Not on file    Non-medical: Not on file  Tobacco Use  . Smoking status: Former Smoker    Packs/day: 0.75    Years: 30.00    Pack years: 22.50    Types: Cigarettes    Quit date: 06/06/1997    Years since quitting: 22.2  . Smokeless tobacco: Never Used  Substance and Sexual Activity  . Alcohol use: No  . Drug use: No  . Sexual activity: Not on file  Lifestyle  . Physical activity  Days per week: Not on file    Minutes per session: Not on file  . Stress: Not on file  Relationships  . Social Herbalist on phone: Not on file    Gets together: Not on file    Attends religious service: Not on file    Active member of club or organization: Not on file    Attends meetings of clubs or organizations: Not on file    Relationship status: Not on file  Other Topics Concern  . Not on file  Social History Narrative  . Not on file   Additional Social History:    Allergies:   Allergies  Allergen Reactions  . Fish Oil Other (See Comments)    Gout  . Glimepiride Other (See Comments)    REACTION: hypoglycemia  . Guanfacine Hcl Other (See Comments)    REACTION: unspecified  . Rosiglitazone Other (See Comments)    CHF    Labs:  Results for orders placed or performed during the hospital encounter of 08/04/19 (from the past 48 hour(s))  Glucose, capillary     Status: Abnormal   Collection Time: 08/11/19  4:41 PM  Result Value Ref Range   Glucose-Capillary 214 (H) 70 - 99 mg/dL  SARS CORONAVIRUS 2 (TAT 6-24 HRS) Nasopharyngeal Nasopharyngeal Swab     Status: None   Collection Time: 08/11/19  6:24 PM   Specimen: Nasopharyngeal Swab  Result Value Ref Range   SARS Coronavirus 2 NEGATIVE NEGATIVE    Comment: (NOTE) SARS-CoV-2 target nucleic acids are NOT DETECTED. The SARS-CoV-2 RNA is generally detectable in upper and lower respiratory specimens during the acute phase of  infection. Negative results do not preclude SARS-CoV-2 infection, do not rule out co-infections with other pathogens, and should not be used as the sole basis for treatment or other patient management decisions. Negative results must be combined with clinical observations, patient history, and epidemiological information. The expected result is Negative. Fact Sheet for Patients: SugarRoll.be Fact Sheet for Healthcare Providers: https://www.woods-mathews.com/ This test is not yet approved or cleared by the [place] FDA and  has been authorized for detection and/or diagnosis of SARS-CoV-2 by FDA under an Emergency Use Authorization (EUA). This EUA will remain  in effect (meaning this test can be used) for the duration of the COVID-19 declaration under Section 56 4(b)(1) of the Act, 21 U.S.C. section 360bbb-3(b)(1), unless the authorization is terminated or revoked sooner. Performed at [facility], [place] [address] [place], [place] [address]   Glucose, capillary     Status: Abnormal   Collection Time: 08/11/19  9:13 PM  Result Value Ref Range   Glucose-Capillary 261 (H) 70 - 99 mg/dL  Basic metabolic panel     Status: Abnormal   Collection Time: 08/12/19  6:24 AM  Result Value Ref Range   Sodium 141 135 - 145 mmol/L   Potassium 4.3 3.5 - 5.1 mmol/L   Chloride 107 98 - 111 mmol/L   CO2 26 22 - 32 mmol/L   Glucose, Bld 234 (H) 70 - 99 mg/dL   BUN 16 8 - 23 mg/dL   Creatinine, Ser 0.95 0.44 - 1.00 mg/dL   Calcium 8.7 (L) 8.9 - 10.3 mg/dL   GFR calc non Af Amer >60 >60 mL/min   GFR calc Af Amer >60 >60 mL/min   Anion gap 8 5 - 15    Comment: Performed at [facility], [address] [place], [place] [address]  Magnesium  Status: None   Collection Time: 08/12/19  6:24 AM  Result Value Ref Range   Magnesium 1.7 1.7 - 2.4 mg/dL    Comment: Performed at [facility], [address] [place], [place] [address]   Phosphorus     Status: None   Collection Time: 08/12/19  6:24 AM  Result Value Ref Range   Phosphorus 3.0 2.5 - 4.6 mg/dL    Comment: Performed at [facility], [address] [place], [place] [address]  Glucose, capillary     Status: Abnormal   Collection Time: 08/12/19  7:47 AM  Result Value Ref Range   Glucose-Capillary 282 (H) 70 - 99 mg/dL  Glucose, capillary     Status: Abnormal   Collection Time: 08/12/19 11:49 AM  Result Value Ref Range   Glucose-Capillary 346 (H) 70 - 99 mg/dL  Glucose, capillary     Status: Abnormal   Collection Time: 08/12/19  4:31 PM  Result Value Ref Range   Glucose-Capillary 302 (H) 70 - 99 mg/dL  Glucose, capillary     Status: Abnormal   Collection Time: 08/12/19  9:14 PM  Result Value Ref Range   Glucose-Capillary 318 (H) 70 - 99 mg/dL  Glucose, capillary     Status: Abnormal   Collection Time: 08/13/19  5:43 AM  Result Value Ref Range   Glucose-Capillary 240 (H) 70 - 99 mg/dL   Comment 1 Notify RN    Comment 2 Document in Chart   Glucose, capillary     Status: Abnormal   Collection Time: 08/13/19  8:30 AM  Result Value Ref Range   Glucose-Capillary 389 (H) 70 - 99 mg/dL  Glucose, capillary     Status: Abnormal   Collection Time: 08/13/19 11:59 AM  Result Value Ref Range   Glucose-Capillary 381 (H) 70 - 99 mg/dL    Current Facility-Administered Medications  Medication Dose Route Frequency Provider Last Rate Last Dose  . aspirin EC tablet 81 mg  81 mg Oral Daily Edwin Dada, MD   81 mg at 08/13/19 0842  . carvedilol (COREG) tablet 12.5 mg  12.5 mg Oral BID WC Danford, Suann Larry, MD   12.5 mg at 08/13/19 0843  . Chlorhexidine Gluconate Cloth 2 % PADS 6 each  6 each Topical Daily Jennye Boroughs, MD   6 each at 08/12/19 1053  . clopidogrel (PLAVIX) tablet 75 mg  75 mg Oral Daily Edwin Dada, MD   75 mg at 08/13/19 0842  . feeding supplement (GLUCERNA SHAKE) (GLUCERNA SHAKE) liquid 237 mL  237 mL Oral TID WC  Danford, Suann Larry, MD   237 mL at 08/13/19 0843  . FLUoxetine (PROZAC) capsule 20 mg  20 mg Oral Daily Mansy, Jan A, MD   20 mg at 08/13/19 0843  . influenza vaccine adjuvanted (FLUAD) injection 0.5 mL  0.5 mL Intramuscular Tomorrow-1000 Danford, Christopher P, MD      . insulin aspart (novoLOG) injection 0-15 Units  0-15 Units Subcutaneous TID WC Edwin Dada, MD   15 Units at 08/13/19 1256  . insulin aspart (novoLOG) injection 0-5 Units  0-5 Units Subcutaneous QHS Edwin Dada, MD   4 Units at 08/12/19 2142  . insulin aspart (novoLOG) injection 5 Units  5 Units Subcutaneous TID WC Loletha Grayer, MD   5 Units at 08/13/19 1257  . insulin glargine (LANTUS) injection 16 Units  16 Units Subcutaneous BID Edwin Dada, MD   16 Units at 08/13/19 [phone]  . ivabradine (  CORLANOR) tablet 5 mg  5 mg Oral BID WC Mansy, Jan A, MD   5 mg at 08/13/19 0843  . morphine 2 MG/ML injection 1 mg  1 mg Intravenous Q3H PRN Ottie Glazier, MD   1 mg at 08/07/19 0232  . nitroGLYCERIN (NITROSTAT) SL tablet 0.4 mg  0.4 mg Sublingual Q5 min PRN Danford, Suann Larry, MD      . pantoprazole (PROTONIX) EC tablet 40 mg  40 mg Oral Daily Edwin Dada, MD   40 mg at 08/13/19 0843  . pneumococcal 23 valent vaccine (PNU-IMMUNE) injection 0.5 mL  0.5 mL Intramuscular Tomorrow-1000 Danford, Suann Larry, MD      . sacubitril-valsartan (ENTRESTO) 49-51 mg per tablet  1 tablet Oral BID Edwin Dada, MD   1 tablet at 08/13/19 0842  . spironolactone (ALDACTONE) tablet 25 mg  25 mg Oral Daily Danford, Suann Larry, MD   25 mg at 08/13/19 5625    Musculoskeletal: Strength & Muscle Tone: within normal limits Gait & Station: unable to stand Patient leans: N/A  Psychiatric Specialty Exam: Physical Exam  Review of Systems  Constitutional: Negative for fever.  HENT: Negative for hearing loss.   Eyes: Negative for blurred vision.  Skin: Negative for rash.   Psychiatric/Behavioral: Negative for depression, hallucinations, substance abuse and suicidal ideas. The patient is not nervous/anxious.     Blood pressure (!) 119/58, pulse 92, temperature 97.9 F (36.6 C), resp. rate 19, height 5' 3"  (1.6 m), weight 64.2 kg, SpO2 97 %.Body mass index is 25.07 kg/m.  General Appearance: Well Groomed  Eye Contact:  Good  Speech:  Clear and Coherent  Volume:  Normal  Mood:  Euthymic  Affect:  Congruent  Thought Process:  Coherent  Orientation:  Full (Time, Place, and Person)  Thought Content:  Logical  Suicidal Thoughts:  No  Homicidal Thoughts:  No  Memory:  Immediate;   Good  Judgement:  Fair  Insight:  Good  Psychomotor Activity:  Normal  Concentration:  Concentration: Fair  Recall:  [place] of Knowledge:  Fair  Language:  Fair  Akathisia:  No  Handed:  Right  AIMS (if indicated):     Assets:  Communication Skills Desire for Improvement Financial Resources/Insurance Housing Leisure Time Resilience Social Support Transportation  ADL's:  Intact  Cognition:  WNL  Sleep:       Competency assessment:  Competency assessment is complicated in this patient due to recent metabolic encephalopathy.  This confused the picture and the family because of the state of her mental cognition when being observed.  Today the patient is no longer feeling confused, and is able to admit that she had been feeling confused in the days prior.  It would be unfair to deem her incompetent due to a period of acute delirium. Furthermore, today, patient expressed a clear understanding of her medical condition (diabetes) as well as the medications needed for this condition.  Patient is able to express an understanding of the risks and benefits of excepting and/or refusing the treatment.  She also has a appreciation for her condition if she were to stop her medications or alter her medication regimen.  Her reasoning regarding her decisions is logical.  The patient is  deemed to have capacity at this time.    Dixie Dials, MD 08/13/2019 3:41 PM

## 2019-08-13 NOTE — Progress Notes (Signed)
Patient ID: Kristy [name], female   DOB: 05/12/1951, 68 y.o.   MRN: [id number] [facility] PROGRESS NOTE  CHANDEL ZAUN VBT:[id number] DOB: 05/09/1951 DOA: 08/04/2019 PCP: Owens Loffler, MD  HPI/Subjective: Patient was wanting to get out of the hospital one way or another today.  Patient offers no complaints except for right leg pain and weakness.  She states that she feels weak.  She is willing to go to rehab.  Family very concerned about her going home and not taking her insulin.  They wanted me to get a psychiatric consultation.  Objective: Vitals:   08/13/19 0848 08/13/19 1549  BP: (!) 119/58 (!) 114/57  Pulse: 92 77  Resp:  19  Temp:  97.8 F (36.6 C)  SpO2:  96%    Intake/Output Summary (Last 24 hours) at 08/13/2019 1609 Last data filed at 08/13/2019 0959 Gross per 24 hour  Intake 240 ml  Output 0 ml  Net 240 ml   Filed Weights   08/11/19 0426 08/12/19 0322 08/13/19 0300  Weight: 63.8 kg 63.9 kg 64.2 kg    ROS: Review of Systems  Constitutional: Positive for malaise/fatigue. Negative for chills and fever.  Eyes: Negative for blurred vision.  Respiratory: Negative for cough and shortness of breath.   Cardiovascular: Negative for chest pain.  Gastrointestinal: Negative for abdominal pain, constipation, diarrhea, nausea and vomiting.  Genitourinary: Negative for dysuria.  Musculoskeletal: Negative for joint pain.  Neurological: Positive for weakness. Negative for dizziness and headaches.   Exam: Physical Exam  HENT:  Nose: No mucosal edema.  Mouth/Throat: No oropharyngeal exudate or posterior oropharyngeal edema.  Eyes: Pupils are equal, round, and reactive to light. Conjunctivae, EOM and lids are normal.  Neck: No JVD present. Carotid bruit is not present. No edema present. No thyroid mass and no thyromegaly present.  Cardiovascular: S1 normal and S2 normal. Exam reveals no gallop.  No murmur heard. Pulses:      Dorsalis pedis pulses are 2+ on the  right side and 2+ on the left side.  Respiratory: No respiratory distress. She has no wheezes. She has no rhonchi. She has no rales.  GI: Soft. Bowel sounds are normal. There is no abdominal tenderness.  Musculoskeletal:     Right ankle: She exhibits no swelling.     Left ankle: She exhibits no swelling.  Lymphadenopathy:    She has no cervical adenopathy.  Neurological: She is alert. No cranial nerve deficit.  Skin: Skin is warm. No rash noted. Nails show no clubbing.  Psychiatric: She has a normal mood and affect.      Data Reviewed: Basic Metabolic Panel: Recent Labs  Lab 08/08/19 0458 08/09/19 0550 08/10/19 0459 08/11/19 0446 08/12/19 0624  NA 141 141 141 140 141  K 3.8 3.8 4.7 3.6 4.3  CL 111 111 108 108 107  CO2 23 22 22 23 26   GLUCOSE 104* 165* 209* 56* 234*  BUN 14 14 14 21 16   CREATININE 0.73 0.75 0.74 0.76 0.95  CALCIUM 7.7* 8.1* 9.1 8.8* 8.7*  MG 1.5* 1.5* 2.0 1.6* 1.7  PHOS 1.5* 2.0* 3.6 2.4* 3.0    Recent Labs  Lab 08/07/19 1321  AMMONIA 16   CBC: Recent Labs  Lab 08/07/19 0433 08/08/19 0458 08/09/19 0550 08/10/19 0459  WBC 17.4* 13.9* 11.4* 13.3*  HGB 14.9 13.5 13.9 16.5*  HCT 45.6 41.5 41.6 52.9*  MCV 89.1 89.4 86.3 92.3  PLT 143* 121* 110* 131*    CBG: Recent Labs  Lab  08/12/19 1631 08/12/19 2114 08/13/19 0543 08/13/19 0830 08/13/19 1159  GLUCAP 302* 318* 240* 389* 381*    Recent Results (from the past 240 hour(s))  SARS Coronavirus 2 by RT PCR (hospital order, performed in [facility] hospital lab) Nasopharyngeal Nasopharyngeal Swab     Status: None   Collection Time: 08/04/19  8:57 PM   Specimen: Nasopharyngeal Swab  Result Value Ref Range Status   SARS Coronavirus 2 NEGATIVE NEGATIVE Final    Comment: (NOTE) If result is NEGATIVE SARS-CoV-2 target nucleic acids are NOT DETECTED. The SARS-CoV-2 RNA is generally detectable in upper and lower  respiratory specimens during the acute phase of infection. The lowest   concentration of SARS-CoV-2 viral copies this assay can detect is 250  copies / mL. A negative result does not preclude SARS-CoV-2 infection  and should not be used as the sole basis for treatment or other  patient management decisions.  A negative result may occur with  improper specimen collection / handling, submission of specimen other  than nasopharyngeal swab, presence of viral mutation(s) within the  areas targeted by this assay, and inadequate number of viral copies  (<250 copies / mL). A negative result must be combined with clinical  observations, patient history, and epidemiological information. If result is POSITIVE SARS-CoV-2 target nucleic acids are DETECTED. The SARS-CoV-2 RNA is generally detectable in upper and lower  respiratory specimens dur ing the acute phase of infection.  Positive  results are indicative of active infection with SARS-CoV-2.  Clinical  correlation with patient history and other diagnostic information is  necessary to determine patient infection status.  Positive results do  not rule out bacterial infection or co-infection with other viruses. If result is PRESUMPTIVE POSTIVE SARS-CoV-2 nucleic acids MAY BE PRESENT.   A presumptive positive result was obtained on the submitted specimen  and confirmed on repeat testing.  While 2019 novel coronavirus  (SARS-CoV-2) nucleic acids may be present in the submitted sample  additional confirmatory testing may be necessary for epidemiological  and / or clinical management purposes  to differentiate between  SARS-CoV-2 and other Sarbecovirus currently known to infect humans.  If clinically indicated additional testing with an alternate test  methodology [phone]) is advised. The SARS-CoV-2 RNA is generally  detectable in upper and lower respiratory sp ecimens during the acute  phase of infection. The expected result is Negative. Fact Sheet for Patients:  StrictlyIdeas.no Fact Sheet  for Healthcare Providers: BankingDealers.co.za This test is not yet approved or cleared by the [place] FDA and has been authorized for detection and/or diagnosis of SARS-CoV-2 by FDA under an Emergency Use Authorization (EUA).  This EUA will remain in effect (meaning this test can be used) for the duration of the COVID-19 declaration under Section 564(b)(1) of the Act, 21 U.S.C. section 360bbb-3(b)(1), unless the authorization is terminated or revoked sooner. Performed at [facility], [address] [place], [place] [address]   Blood culture (routine x 2)     Status: None   Collection Time: 08/05/19 12:56 AM   Specimen: BLOOD  Result Value Ref Range Status   Specimen Description BLOOD BLOOD LEFT HAND  Final   Special Requests   Final    BOTTLES DRAWN AEROBIC AND ANAEROBIC Blood Culture adequate volume   Culture   Final    NO GROWTH 5 DAYS Performed at [facility], [address] [place], [place] [address]    Report Status 08/10/2019 FINAL  Final  Blood culture (routine x  2)     Status: Abnormal   Collection Time: 08/05/19 12:58 AM   Specimen: BLOOD  Result Value Ref Range Status   Specimen Description   Final    BLOOD BLOOD RIGHT FOREARM Performed at [facility], [address], [place], [place] [address]    Special Requests   Final    BOTTLES DRAWN AEROBIC AND ANAEROBIC Blood Culture results may not be optimal due to an inadequate volume of blood received in culture bottles Performed at [facility], [address], [place], [place] [address]    Culture  Setup Time   Final    GRAM POSITIVE COCCI ANAEROBIC BOTTLE ONLY CRITICAL RESULT CALLED TO, READ BACK BY AND VERIFIED WITH: [place] ON 08/06/2019 SNG Performed at [facility], [address] [place], [place] [address]    Culture (A)  Final    STAPHYLOCOCCUS SPECIES (COAGULASE NEGATIVE) THE SIGNIFICANCE OF ISOLATING THIS  ORGANISM FROM A SINGLE SET OF BLOOD CULTURES WHEN MULTIPLE SETS ARE DRAWN IS UNCERTAIN. PLEASE NOTIFY THE MICROBIOLOGY DEPARTMENT WITHIN ONE WEEK IF SPECIATION AND SENSITIVITIES ARE REQUIRED. Performed at [facility], [place] [address] [place], [place] [address]    Report Status 08/09/2019 FINAL  Final  MRSA PCR Screening     Status: None   Collection Time: 08/05/19 11:56 AM   Specimen: Nasopharyngeal  Result Value Ref Range Status   MRSA by PCR NEGATIVE NEGATIVE Final    Comment:        The GeneXpert MRSA Assay (FDA approved for NASAL specimens only), is one component of a comprehensive MRSA colonization surveillance program. It is not intended to diagnose MRSA infection nor to guide or monitor treatment for MRSA infections. Performed at [facility], [address], [place], [place] [address]   Urine Culture     Status: None   Collection Time: 08/05/19  4:10 PM   Specimen: Urine, Random  Result Value Ref Range Status   Specimen Description   Final    URINE, RANDOM Performed at [facility], [address] [place], [place] [address]    Special Requests   Final    NONE Performed at [facility], [address], [place], [place] [address]    Culture   Final    NO GROWTH Performed at [facility], [place] [address], [place], [place] [address]    Report Status 08/06/2019 FINAL  Final  SARS CORONAVIRUS 2 (TAT 6-24 HRS) Nasopharyngeal Nasopharyngeal Swab     Status: None   Collection Time: 08/11/19  6:24 PM   Specimen: Nasopharyngeal Swab  Result Value Ref Range Status   SARS Coronavirus 2 NEGATIVE NEGATIVE Final    Comment: (NOTE) SARS-CoV-2 target nucleic acids are NOT DETECTED. The SARS-CoV-2 RNA is generally detectable in upper and lower respiratory specimens during the acute phase of infection. Negative results do not preclude SARS-CoV-2 infection, do not rule out co-infections with other pathogens, and should not be used as  the sole basis for treatment or other patient management decisions. Negative results must be combined with clinical observations, patient history, and epidemiological information. The expected result is Negative. Fact Sheet for Patients: SugarRoll.be Fact Sheet for Healthcare Providers: https://www.woods-mathews.com/ This test is not yet approved or cleared by the [place] FDA and  has been authorized for detection and/or diagnosis of SARS-CoV-2 by FDA under an Emergency Use Authorization (EUA). This EUA will remain  in effect (meaning this test can be used)  for the duration of the COVID-19 declaration under Section 56 4(b)(1) of the Act, 21 U.S.C. section 360bbb-3(b)(1), unless the authorization is terminated or revoked sooner. Performed at [facility], [place] [address] [place], [place] [address]      Studies: No results found.  Scheduled Meds: . aspirin EC  81 mg Oral Daily  . carvedilol  12.5 mg Oral BID WC  . Chlorhexidine Gluconate Cloth  6 each Topical Daily  . clopidogrel  75 mg Oral Daily  . feeding supplement (GLUCERNA SHAKE)  237 mL Oral TID WC  . FLUoxetine  20 mg Oral Daily  . influenza vaccine adjuvanted  0.5 mL Intramuscular Tomorrow-1000  . insulin aspart  0-15 Units Subcutaneous TID WC  . insulin aspart  0-5 Units Subcutaneous QHS  . insulin aspart  5 Units Subcutaneous TID WC  . insulin glargine  16 Units Subcutaneous BID  . ivabradine  5 mg Oral BID WC  . pantoprazole  40 mg Oral Daily  . pneumococcal 23 valent vaccine  0.5 mL Intramuscular Tomorrow-1000  . sacubitril-valsartan  1 tablet Oral BID  . spironolactone  25 mg Oral Daily   Continuous Infusions:  Assessment/Plan:  1. Diabetic ketoacidosis on presentation.  Hemoglobin A1c elevated at 10.9.  Patient often short acting insulin prior to meals plus sliding scale and long-acting insulin. 2. Sepsis on presentation with UTI.  Patient received full  course of antibiotics. 3. Acute metabolic encephalopathy secondary to elevated sugars and sepsis.  This has improved.  Family still concerned about the patient's mental status and her not taking insulin at home.  Appreciate psychiatric consultation.  The patient does have capacity to make decisions at this time.  Family concerned that she will not take her insulin at home. 4. Possible coffee-ground emesis.  On PPI.  Seen by GI and they did not want to do a procedure.  Can take Protonix for a month and stop. 5. Chronic systolic congestive heart failure with EF 35 to 40% on Entresto Coreg and Aldactone 6. Acute kidney injury on chronic kidney disease stage II. 7. Electrolyte abnormalities with hyponatremia hypomagnesemia and hypokalemia and hypophosphatemia.  These have all improved. 8. Thrombocytopenia.  Last platelet count 131. 9. Weakness.  Physical therapy recommends rehab.  Code Status:     Code Status Orders  (From admission, onward)         Start     Ordered   08/04/19 2310  Full code  Continuous     08/04/19 2313        Code Status History    Date Active Date Inactive Code Status Order ID Comments User Context   07/08/2019 2027 07/14/2019 1903 Full Code [id number]  Henreitta Leber, MD Inpatient   04/19/2019 2216 04/28/2019 1910 Full Code [id number]  Loletha Grayer, MD ED   08/26/2018 1151 08/26/2018 1736 Full Code [id number]  Wellington [place], MD Inpatient   05/14/2018 0401 05/15/2018 1421 Full Code [id number]  Lance Coon, MD Inpatient   Advance Care Planning Activity    Advance Directive Documentation     Most Recent Value  Type of Advance Directive  Healthcare Power of Attorney  Pre-existing out of facility DNR order (yellow form or pink MOST form)  -  "MOST" Form in Place?  -     Family Communication: Spoke with family outside the room. Disposition Plan: Out to rehab once insurance authorization approved.  Cancel discharge to rehab today since we do not have an answer  yet.  Time spent: 35 minutes  [place]

## 2019-08-13 NOTE — TOC Progression Note (Signed)
Transition of Care [facility]) - Progression Note    Patient Details  Name: Kristy [name] MRN: [id number] Date of Birth: 10/08/1950  [facility] CM/SW Contact  [name], [place] Phone Number: 08/13/2019, 3:33 PM  Clinical Narrative:    CSW spoke with patient's daughter Jocelyn Lamer [phone] who was at bedside, [place] contacted [employer], they said insurance requested updated therapy notes, per SNF, they sent updates to insurance company.  SNF is hoping insurance will be approved tomorrow.  CSW updated physician, and patient's family.  Patient's family requested a psych consult for competency and capacity.  Physician will put a consult in for psych to see her.  CSW informed patient's daughter that insurance company may deny and the only other option would be to pay privately or go home with home health.  CSW also informed patient's daughter that if patient does have capacity and competency and psych does verify it, the hospital can not force a patient to go to SNF.  CSW informed patient's daughter that if she has to go home with home health, they may want to consider private paying for a care agency to come to patient's house.  CSW informed her this may be the best option if she is unable to go to SNF.  Patient's daughter expressed understanding, CSW attempted to contact patient's niece Joellen Jersey at [phone], however recording said it was not an active number.  Patient's daughter was concerned that if patient goes home, she may end up coming right back to the hospital due to patient not being compliant with medication.  CSW said that APS can be contacted if they are concerned it is not a safe situation, but unfortunately that is the only other option and having home health follow.  Patient's daughter expressed understanding and did not have any other questions.  CSW to continue to follow patient's progress throughout discharge planning.       Expected Discharge Plan: [place] Barriers to Discharge: Continued Medical Work up  Expected Discharge Plan and Services Expected Discharge Plan: [place] Choice: [place] arrangements for the past 2 months: Single Family Home Expected Discharge Date: 08/13/19                                     Social Determinants of Health (SDOH) Interventions    Readmission Risk Interventions Readmission Risk Prevention Plan 04/28/2019 04/27/2019  Post Dischage Appt - Complete  Medication Screening - Complete  Transportation Screening - Complete  PCP or Specialist Appt within 5-7 Days Complete -  Home Care Screening Complete -  Medication Review (RN CM) Complete -  Some recent data might be hidden

## 2019-08-14 LAB — GLUCOSE, CAPILLARY
Glucose-Capillary: 328 mg/dL — ABNORMAL HIGH (ref 70–99)
Glucose-Capillary: 302 mg/dL — ABNORMAL HIGH (ref 70–99)
Glucose-Capillary: 312 mg/dL — ABNORMAL HIGH (ref 70–99)
Glucose-Capillary: 190 mg/dL — ABNORMAL HIGH (ref 70–99)

## 2019-08-14 MED ORDER — INSULIN GLARGINE 100 UNIT/ML ~~LOC~~ SOLN
18.0000 [IU] | Freq: Two times a day (BID) | SUBCUTANEOUS | Status: DC
  Administered 2019-08-14 – 2019-08-15 (×2): 18 [IU] via SUBCUTANEOUS
  Filled 2019-08-14 (×4): qty 0.18

## 2019-08-14 MED ORDER — ENSURE MAX PROTEIN PO LIQD
11.0000 [oz_av] | Freq: Every day | ORAL | Status: DC
  Filled 2019-08-14: qty 330

## 2019-08-14 NOTE — Progress Notes (Signed)
Nutrition Follow-up  DOCUMENTATION CODES:   Not applicable  INTERVENTION:  Will discontinue Glucerna TID.  Provide Ensure Max Protein po once daily, each supplement provides 150 kcal and 30 grams of protein. Ensure Max Protein is an appropriate supplement for patients with DM/elevated CBGs. It actually contains less carbohydrates than Glucerna.  NUTRITION DIAGNOSIS:   Inadequate oral intake related to decreased appetite, lethargy/confusion as evidenced by meal completion < 50%.  Resolving - PO intake improving.  GOAL:   Patient will meet greater than or equal to 90% of their needs  Progressing.  MONITOR:   PO intake, Supplement acceptance, Labs, Weight trends, I & O's  REASON FOR ASSESSMENT:   Malnutrition Screening Tool    ASSESSMENT:   68 year old female with PMHx of DM, HTN, HLD, gout, OA, IBS, Crohn's disease, diverticulosis, GERD, nonischemic cardiomyopathy, CHF admitted with HHNK, acute metabolic encephalopathy, sepsis, AKI on CKD stage III, coffee-ground emesis.  Met with patient at bedside this morning. She was dressed in her clothes and upset that she has not been able to leave yet. Patient reports her appetite is improved now and she is eating well. For some reason her oral nutrition supplement was switched from Ensure Max Protein to Glucerna. Patient reports she does not like Glucerna as it tastes like it has artificial sweeteners in it. She is amenable to drinking one bottle of Ensure Max daily in setting of increased needs.  Medications reviewed and include: Novolog 0-15 units TID, Novolog 0-5 units QHS, Novolog 5 units TID, Lantus 18 units BID, pantoprazole.  Labs reviewed: CBG 231-381.  Diet Order:   Diet Order            Diet Carb Modified        Diet Carb Modified Fluid consistency: Thin; Room service appropriate? Yes  Diet effective now             EDUCATION NEEDS:   No education needs have been identified at this time  Skin:  Skin  Assessment: Reviewed RN Assessment  Last BM:  08/12/2019 - large type 6  Height:   Ht Readings from Last 1 Encounters:  08/05/19 _0  (1.6 m)   Weight:   Wt Readings from Last 1 Encounters:  08/14/19 64.5 kg   Ideal Body Weight:  52.3 kg  BMI:  Body mass index is 25.21 kg/m.  Estimated Nutritional Needs:   Kcal:  1500-1700  Protein:  75-85 grams  Fluid:  1.5-1.7 L/day  Jacklynn Barnacle, MS, RD, LDN Office: [phone] Pager: [phone] After Hours/Weekend Pager: [phone]

## 2019-08-14 NOTE — Progress Notes (Signed)
Patient ID: Kristy [name], female   DOB: 05/06/1951, 68 y.o.   MRN: [id number] [facility] PROGRESS NOTE  Kristy [name]:[id number] DOB: Sep 24, 1951 DOA: 08/04/2019 PCP: Kristy Loffler, MD  HPI/Subjective: Patient physically feels okay.  Feels tired.  Does have some discomfort in her right leg.  Objective: Vitals:   08/14/19 0348 08/14/19 0813  BP: (!) 117/51 (!) 109/58  Pulse: 79 82  Resp: 18 19  Temp: 99 F (37.2 C) 98.5 F (36.9 C)  SpO2: 98% 100%    Intake/Output Summary (Last 24 hours) at 08/14/2019 1548 Last data filed at 08/14/2019 1300 Gross per 24 hour  Intake 600 ml  Output -  Net 600 ml   Filed Weights   08/12/19 0322 08/13/19 0300 08/14/19 0348  Weight: 63.9 kg 64.2 kg 64.5 kg    ROS: Review of Systems  Constitutional: Positive for malaise/fatigue. Negative for chills and fever.  Eyes: Negative for blurred vision.  Respiratory: Negative for cough and shortness of breath.   Cardiovascular: Negative for chest pain.  Gastrointestinal: Negative for abdominal pain, constipation, diarrhea, nausea and vomiting.  Genitourinary: Negative for dysuria.  Musculoskeletal: Positive for joint pain.  Neurological: Positive for weakness. Negative for dizziness and headaches.   Exam: Physical Exam  HENT:  Nose: No mucosal edema.  Mouth/Throat: No oropharyngeal exudate or posterior oropharyngeal edema.  Eyes: Pupils are equal, round, and reactive to light. Conjunctivae, EOM and lids are normal.  Neck: No JVD present. Carotid bruit is not present. No edema present. No thyroid mass and no thyromegaly present.  Cardiovascular: S1 normal and S2 normal. Exam reveals no gallop.  No murmur heard. Pulses:      Dorsalis pedis pulses are 2+ on the right side and 2+ on the left side.  Respiratory: No respiratory distress. She has no wheezes. She has no rhonchi. She has no rales.  GI: Soft. Bowel sounds are normal. There is no abdominal tenderness.  Musculoskeletal:      Right ankle: She exhibits no swelling.     Left ankle: She exhibits no swelling.  Lymphadenopathy:    She has no cervical adenopathy.  Neurological: She is alert. No cranial nerve deficit.  Skin: Skin is warm. No rash noted. Nails show no clubbing.  Psychiatric: She has a normal mood and affect.      Data Reviewed: Basic Metabolic Panel: Recent Labs  Lab 08/08/19 0458 08/09/19 0550 08/10/19 0459 08/11/19 0446 08/12/19 0624  NA 141 141 141 140 141  K 3.8 3.8 4.7 3.6 4.3  CL 111 111 108 108 107  CO2 23 22 22 23 26   GLUCOSE 104* 165* 209* 56* 234*  BUN 14 14 14 21 16   CREATININE 0.73 0.75 0.74 0.76 0.95  CALCIUM 7.7* 8.1* 9.1 8.8* 8.7*  MG 1.5* 1.5* 2.0 1.6* 1.7  PHOS 1.5* 2.0* 3.6 2.4* 3.0   CBC: Recent Labs  Lab 08/08/19 0458 08/09/19 0550 08/10/19 0459  WBC 13.9* 11.4* 13.3*  HGB 13.5 13.9 16.5*  HCT 41.5 41.6 52.9*  MCV 89.4 86.3 92.3  PLT 121* 110* 131*    CBG: Recent Labs  Lab 08/13/19 1159 08/13/19 1613 08/13/19 2106 08/14/19 0815 08/14/19 1156  GLUCAP 381* 239* 231* 328* 302*    Recent Results (from the past 240 hour(s))  SARS Coronavirus 2 by RT PCR (hospital order, performed in [facility] hospital lab) Nasopharyngeal Nasopharyngeal Swab     Status: None   Collection Time: 08/04/19  8:57 PM   Specimen: Nasopharyngeal Swab  Result Value Ref Range Status   SARS Coronavirus 2 NEGATIVE NEGATIVE Final    Comment: (NOTE) If result is NEGATIVE SARS-CoV-2 target nucleic acids are NOT DETECTED. The SARS-CoV-2 RNA is generally detectable in upper and lower  respiratory specimens during the acute phase of infection. The lowest  concentration of SARS-CoV-2 viral copies this assay can detect is 250  copies / mL. A negative result does not preclude SARS-CoV-2 infection  and should not be used as the sole basis for treatment or other  patient management decisions.  A negative result may occur with  improper specimen collection / handling, submission  of specimen other  than nasopharyngeal swab, presence of viral mutation(s) within the  areas targeted by this assay, and inadequate number of viral copies  (<250 copies / mL). A negative result must be combined with clinical  observations, patient history, and epidemiological information. If result is POSITIVE SARS-CoV-2 target nucleic acids are DETECTED. The SARS-CoV-2 RNA is generally detectable in upper and lower  respiratory specimens dur ing the acute phase of infection.  Positive  results are indicative of active infection with SARS-CoV-2.  Clinical  correlation with patient history and other diagnostic information is  necessary to determine patient infection status.  Positive results do  not rule out bacterial infection or co-infection with other viruses. If result is PRESUMPTIVE POSTIVE SARS-CoV-2 nucleic acids MAY BE PRESENT.   A presumptive positive result was obtained on the submitted specimen  and confirmed on repeat testing.  While 2019 novel coronavirus  (SARS-CoV-2) nucleic acids may be present in the submitted sample  additional confirmatory testing may be necessary for epidemiological  and / or clinical management purposes  to differentiate between  SARS-CoV-2 and other Sarbecovirus currently known to infect humans.  If clinically indicated additional testing with an alternate test  methodology [phone]) is advised. The SARS-CoV-2 RNA is generally  detectable in upper and lower respiratory sp ecimens during the acute  phase of infection. The expected result is Negative. Fact Sheet for Patients:  StrictlyIdeas.no Fact Sheet for Healthcare Providers: BankingDealers.co.za This test is not yet approved or cleared by the [place] FDA and has been authorized for detection and/or diagnosis of SARS-CoV-2 by FDA under an Emergency Use Authorization (EUA).  This EUA will remain in effect (meaning this test can be used) for  the duration of the COVID-19 declaration under Section 564(b)(1) of the Act, 21 U.S.C. section 360bbb-3(b)(1), unless the authorization is terminated or revoked sooner. Performed at [facility], [name] [place], [place] [address]   Blood culture (routine x 2)     Status: None   Collection Time: 08/05/19 12:56 AM   Specimen: BLOOD  Result Value Ref Range Status   Specimen Description BLOOD BLOOD LEFT HAND  Final   Special Requests   Final    BOTTLES DRAWN AEROBIC AND ANAEROBIC Blood Culture adequate volume   Culture   Final    NO GROWTH 5 DAYS Performed at [facility], [address] [place], [place] [address]    Report Status 08/10/2019 FINAL  Final  Blood culture (routine x 2)     Status: Abnormal   Collection Time: 08/05/19 12:58 AM   Specimen: BLOOD  Result Value Ref Range Status   Specimen Description   Final    BLOOD BLOOD RIGHT FOREARM Performed at [facility], [address], [place], [place] [address]    Special Requests   Final    BOTTLES DRAWN AEROBIC AND  ANAEROBIC Blood Culture results may not be optimal due to an inadequate volume of blood received in culture bottles Performed at [facility], [address] [place], [place] [address]    Culture  Setup Time   Final    GRAM POSITIVE COCCI ANAEROBIC BOTTLE ONLY CRITICAL RESULT CALLED TO, READ BACK BY AND VERIFIED WITH: [place] 08/06/2019 SNG Performed at [facility], [address] [place], [place] [address]    Culture (A)  Final    STAPHYLOCOCCUS SPECIES (COAGULASE NEGATIVE) THE SIGNIFICANCE OF ISOLATING THIS ORGANISM FROM A SINGLE SET OF BLOOD CULTURES WHEN MULTIPLE SETS ARE DRAWN IS UNCERTAIN. PLEASE NOTIFY THE MICROBIOLOGY DEPARTMENT WITHIN ONE WEEK IF SPECIATION AND SENSITIVITIES ARE REQUIRED. Performed at [facility], [place] [address] [place], [place] [address]    Report Status 08/09/2019 FINAL  Final  MRSA PCR Screening      Status: None   Collection Time: 08/05/19 11:56 AM   Specimen: Nasopharyngeal  Result Value Ref Range Status   MRSA by PCR NEGATIVE NEGATIVE Final    Comment:        The GeneXpert MRSA Assay (FDA approved for NASAL specimens only), is one component of a comprehensive MRSA colonization surveillance program. It is not intended to diagnose MRSA infection nor to guide or monitor treatment for MRSA infections. Performed at [facility], [address], [place], [place] [address]   Urine Culture     Status: None   Collection Time: 08/05/19  4:10 PM   Specimen: Urine, Random  Result Value Ref Range Status   Specimen Description   Final    URINE, RANDOM Performed at [facility], [address], [place], [place] [address]    Special Requests   Final    NONE Performed at [facility], [address], [place], [place] [address]    Culture   Final    NO GROWTH Performed at [facility], [place] [address], [place], [place] [address]    Report Status 08/06/2019 FINAL  Final  SARS CORONAVIRUS 2 (TAT 6-24 HRS) Nasopharyngeal Nasopharyngeal Swab     Status: None   Collection Time: 08/11/19  6:24 PM   Specimen: Nasopharyngeal Swab  Result Value Ref Range Status   SARS Coronavirus 2 NEGATIVE NEGATIVE Final    Comment: (NOTE) SARS-CoV-2 target nucleic acids are NOT DETECTED. The SARS-CoV-2 RNA is generally detectable in upper and lower respiratory specimens during the acute phase of infection. Negative results do not preclude SARS-CoV-2 infection, do not rule out co-infections with other pathogens, and should not be used as the sole basis for treatment or other patient management decisions. Negative results must be combined with clinical observations, patient history, and epidemiological information. The expected result is Negative. Fact Sheet for Patients: SugarRoll.be Fact Sheet for Healthcare  Providers: https://www.woods-mathews.com/ This test is not yet approved or cleared by the [place] FDA and  has been authorized for detection and/or diagnosis of SARS-CoV-2 by FDA under an Emergency Use Authorization (EUA). This EUA will remain  in effect (meaning this test can be used) for the duration of the COVID-19 declaration under Section 56 4(b)(1) of the Act, 21 U.S.C. section 360bbb-3(b)(1), unless the authorization is terminated or revoked sooner. Performed at [facility], [place] [address] [place],  [address]       Scheduled Meds: . aspirin EC  81 mg Oral Daily  . carvedilol  12.5 mg Oral BID WC  .  Chlorhexidine Gluconate Cloth  6 each Topical Daily  . clopidogrel  75 mg Oral Daily  . FLUoxetine  20 mg Oral Daily  . influenza vaccine adjuvanted  0.5 mL Intramuscular Tomorrow-1000  . insulin aspart  0-15 Units Subcutaneous TID WC  . insulin aspart  0-5 Units Subcutaneous QHS  . insulin aspart  5 Units Subcutaneous TID WC  . insulin glargine  18 Units Subcutaneous BID  . ivabradine  5 mg Oral BID WC  . pantoprazole  40 mg Oral Daily  . pneumococcal 23 valent vaccine  0.5 mL Intramuscular Tomorrow-1000  . Ensure Max Protein  11 oz Oral Daily  . sacubitril-valsartan  1 tablet Oral BID  . spironolactone  25 mg Oral Daily    Assessment/Plan:  1. Diabetic ketoacidosis on presentation.  Hemoglobin A1c elevated at 10.9.  Patient often short acting insulin prior to meals plus sliding scale and long-acting insulin.  Long-acting insulin increased to 18 units twice daily. 2. Sepsis on presentation with UTI.  Patient received full course of antibiotics. 3. Acute metabolic encephalopathy secondary to elevated sugars and sepsis.  Patient's mental status has improved.  Patient's family concerned about her living alone. 4. Possible coffee-ground emesis.  On PPI.  Seen by GI and they did not want to do a procedure.  Can take Protonix for a month and  stop. 5. Chronic systolic congestive heart failure with EF 35 to 40% on Entresto Coreg and Aldactone 6. Acute kidney injury on chronic kidney disease stage II. 7. Electrolyte abnormalities with hyponatremia hypomagnesemia and hypokalemia and hypophosphatemia.  These have all improved. 8. Thrombocytopenia.  Last platelet count 131. 9. Weakness.  Physical therapy recommends rehab.  Code Status:     Code Status Orders  (From admission, onward)         Start     Ordered   08/04/19 2310  Full code  Continuous     08/04/19 2313        Code Status History    Date Active Date Inactive Code Status Order ID Comments User Context   07/08/2019 2027 07/14/2019 1903 Full Code [id number]  Henreitta Leber, MD Inpatient   04/19/2019 2216 04/28/2019 1910 Full Code [id number]  Loletha Grayer, MD ED   08/26/2018 1151 08/26/2018 1736 Full Code [id number]  Wellington [place], MD Inpatient   05/14/2018 0401 05/15/2018 1421 Full Code [id number]  Lance Coon, MD Inpatient   Advance Care Planning Activity    Advance Directive Documentation     Most Recent Value  Type of Advance Directive  Healthcare Power of Attorney  Pre-existing out of facility DNR order (yellow form or pink MOST form)  -  "MOST" Form in Place?  -     Family Communication: Left message for Joellen Jersey the patient's niece and POA Disposition Plan: Awaiting for peer to peer review for rehab.  I put in a call this morning and have not received a call back yet.  Patient is an unsafe discharge home because she lives by herself.  Family very concerned that she does not take her insulin at home.   Time spent: 28 minutes.  Case discussed with nursing staff and social worker.  Put in a call for peer review.  Albert  [facility] [employer]

## 2019-08-14 NOTE — Progress Notes (Signed)
Physical Therapy Treatment Patient Details Name: Kristy [name] MRN: [id number] DOB: 1951/01/14 Today's Date: 08/14/2019    History of Present Illness 68 y.o. female with a known history of multiple medical problems including coronary artery disease, nonischemic cardiomyopathy, Crohn's disease and type 2 diabetes mellitus with history of DKA, presented to emergency room with acute onset of hematemesis and altered mental status with decreased responsiveness.  Poorly Controlled DM II, Acute metabolic encephalopathy, hypernatremia, hypmagnesemia, hypokalemia, sepsis with possible UTI, coffee-ground emesis, HFrEF, and abdominal pain.    PT Comments    Pt had, by far, her longest and best bout of ambulation since arrival.  She was joking and pleasant t/o the session, but needed consistent cuing for safety and situational awareness.  She did have one considerable LOB that she needed min assist to keep balance but was able to circumambulate the nurses' station with O2 sats staying in the high 90s and no overt fatigue.  Pt physically showing improvement but showed safety awareness concerns as well as apparently AMS that makes living at home alone a safety concern, pt recommending consistent supervision on transition home to insure safety.   Follow Up Recommendations  Home health PT;Supervision/Assistance - 24 hour     Equipment Recommendations  None recommended by PT    Recommendations for Other Services       Precautions / Restrictions Precautions Precautions: Fall Restrictions Weight Bearing Restrictions: No    Mobility  Bed Mobility Overal bed mobility: Modified Independent Bed Mobility: Sit to Supine     Supine to sit: Min guard Sit to supine: Min guard   General bed mobility comments: Pt able to get from supine to sitting with relative ease  Transfers Overall transfer level: Modified independent Equipment used: Rolling walker (2 wheeled) Transfers: Sit to/from Stand Sit to  Stand: Supervision         General transfer comment: Pt did not struggle to attain standing, reliant on walker for balance  Ambulation/Gait Ambulation/Gait assistance: Min guard Gait Distance (Feet): 200 Feet Assistive device: Rolling walker (2 wheeled)       General Gait Details: Pt with much more consistent cadence, as well as increased speed and decreased LOB/safety concerns.  She did have one stagger step while distracted with someone going down hallway, min assist to insure safety.  Pt reliant on the walker but able to move much better than she has been on this admission   Stairs             Wheelchair Mobility    Modified Rankin (Stroke Patients Only)       Balance Overall balance assessment: Needs assistance Sitting-balance support: Single extremity supported;Feet supported Sitting balance-Leahy Scale: Good Sitting balance - Comments: steady sitting EOB     Standing balance-Leahy Scale: Fair Standing balance comment: able to maintain balance with walker, showed unsteadiness with decreased UE use                            Cognition Arousal/Alertness: Awake/alert Behavior During Therapy: Impulsive;Restless Overall Cognitive Status: Difficult to assess                                        Exercises General Exercises - Lower Extremity Ankle Circles/Pumps: AROM;10 reps Long Arc Quad: Strengthening;10 reps Heel Slides: Strengthening;10 reps Hip ABduction/ADduction: Strengthening;10 reps Hip Flexion/Marching: Strengthening;10 reps  General Comments        Pertinent Vitals/Pain Pain Assessment: No/denies pain(states R knee had been hurting) Pain Score: 0-No pain    Home Living                      Prior Function            PT Goals (current goals can now be found in the care plan section) Progress towards PT goals: Progressing toward goals    Frequency    Min 2X/week      PT Plan Current plan  remains appropriate    Co-evaluation              AM-PAC PT "6 Clicks" Mobility   Outcome Measure  Help needed turning from your back to your side while in a flat bed without using bedrails?: None Help needed moving from lying on your back to sitting on the side of a flat bed without using bedrails?: None Help needed moving to and from a bed to a chair (including a wheelchair)?: A Little Help needed standing up from a chair using your arms (e.g., wheelchair or bedside chair)?: A Little Help needed to walk in hospital room?: A Little Help needed climbing 3-5 steps with a railing? : A Little 6 Click Score: 20    End of Session Equipment Utilized During Treatment: Gait belt Activity Tolerance: Patient tolerated treatment well Patient left: with chair alarm set;with call bell/phone within reach Nurse Communication: Mobility status PT Visit Diagnosis: Unsteadiness on feet (R26.81);History of falling (Z91.81);Difficulty in walking, not elsewhere classified (R26.2);Muscle weakness (generalized) (M62.81)     Time: 3419-6222 PT Time Calculation (min) (ACUTE ONLY): 25 min  Charges:  $Gait Training: 8-22 mins $Therapeutic Exercise: 8-22 mins                     Kreg Shropshire, DPT 08/14/2019, 4:15 PM

## 2019-08-14 NOTE — Progress Notes (Signed)
Inpatient Diabetes Program Recommendations  AACE/ADA: New Consensus Statement on Inpatient Glycemic Control (2015)  Target Ranges:  Prepandial:   less than 140 mg/dL      Peak postprandial:   less than 180 mg/dL (1-2 hours)      Critically ill patients:  140 - 180 mg/dL   Results for Kristy [name], Kristy [name] (MRN [id number]) as of 08/14/2019 09:20  Ref. Range 08/13/2019 05:43 08/13/2019 08:30 08/13/2019 11:59 08/13/2019 16:13 08/13/2019 21:06  Glucose-Capillary Latest Ref Range: 70 - 99 mg/dL 240 (H) 389 (H) 381 (H) 239 (H) 231 (H)   Home DM Meds: Humulin RU500 Insulin per SSI <200- 0 units 201-300- 20 units 300-400- 50 units 401-500- 90 units >500- 100 units  Current Orders: Lantus 16 units BID      Novolog Mpderate Correction Scale/ SSI (0-15 units) tid + hs      Novolog 5 units TID with meals    MD- Glucose trends consistently >200  Increase Lantus slightly to 18 units BID   --Will follow patient during hospitalization--  Tama Headings RN, MSN, BC-ADM Inpatient Diabetes Coordinator Team Pager [phone] (8a-5p)

## 2019-08-14 NOTE — Care Management Important Message (Signed)
Important Message  Patient Details  Name: Kristy [name] MRN: [id number] Date of Birth: 05-Apr-1951   Medicare Important Message Given:  Yes     [name] 08/14/2019, 1:38 PM

## 2019-08-14 NOTE — TOC Progression Note (Signed)
Transition of Care [facility]) - Progression Note    Patient Details  Name: JALEXIS BREED MRN: [id number] Date of Birth: 1950/10/13  [facility] CM/SW Contact  [name], [place] Phone Number: 08/14/2019, 10:14 AM  Clinical Narrative:     CSW received a phone call from [facility], who said [employer] denied patient for SNF placement and requested a peer to peer by the attending physician.  CSW notified the physician that the Peer to Peer number is [phone] and they said it needs to be called by 4pm today, denial reference number is [id number].  CSW awaiting results from peer to peer.   Expected Discharge Plan: [place] Barriers to Discharge: Continued Medical Work up  Expected Discharge Plan and Services Expected Discharge Plan: [place] Choice: [place] arrangements for the past 2 months: Single Family Home Expected Discharge Date: 08/13/19                                     Social Determinants of Health (SDOH) Interventions    Readmission Risk Interventions Readmission Risk Prevention Plan 04/28/2019 04/27/2019  Post Dischage Appt - Complete  Medication Screening - Complete  Transportation Screening - Complete  PCP or Specialist Appt within 5-7 Days Complete -  Home Care Screening Complete -  Medication Review (RN CM) Complete -  Some recent data might be hidden

## 2019-08-14 NOTE — TOC Progression Note (Addendum)
Transition of Care [facility]) - Progression Note    Patient Details  Name: Kristy [name] MRN: [id number] Date of Birth: 23-May-1951  Transition of Care [facility]) CM/SW Contact  [name], [place] Phone Number: 08/14/2019, 5:09 PM  Clinical Narrative:    CSW contacted Olegario Shearer patient's daughter [phone], Powers informed her that insurance denied patient for SNF.  CSW informed her the physician completed a peer to peer in which the physician here speaks to the medical director at [facility], per physician, patient was still denied.  Patient's daughter was not happy about the decision but accepted it.  CSW offered her the option to appeal but she was not sure if she wanted to pursue it.  CSW informed her that because PT changed their recommendation, most likely the appeal will not be successful, but she can try if she wants.  Patient's daughter was given the phone number to call if she decides to pursue it.  She stated she was going to think about it.    CSW informed patient's daughter that patient did better today with PT, and they changed their recommendation to home with home health. CSW emailed daughter list of private care agencies, which she will contact and try to get private in home care providers.  CSW asked physician to order home health , PT,OT, RN, Aide and social work.  Patient's daughter stated they do not need any DME.  CSW spoke to daughter and provided choice of home health agencies, she said patient has had [facility] in the past, and are okay with having them again.    CSW spoke to [place] at [facility] and she will accept patient.CSW asked patient's daughter to email a copy of the HCPOA paperwork so CSW can add to the medical records.  CSW to continue to facilitate discharge planning.    Expected Discharge Plan: [place] Barriers to Discharge: Continued Medical Work up  Expected Discharge Plan and Services Expected Discharge Plan: [place] Choice: [place] arrangements for the past 2 months: Single Family Home Expected Discharge Date: 08/13/19                                     Social Determinants of Health (SDOH) Interventions    Readmission Risk Interventions Readmission Risk Prevention Plan 04/28/2019 04/27/2019  Post Dischage Appt - Complete  Medication Screening - Complete  Transportation Screening - Complete  PCP or Specialist Appt within 5-7 Days Complete -  Home Care Screening Complete -  Medication Review (RN CM) Complete -  Some recent data might be hidden

## 2019-08-15 ENCOUNTER — Other Ambulatory Visit: Payer: Self-pay | Admitting: *Deleted

## 2019-08-15 DIAGNOSIS — N390 Urinary tract infection, site not specified: Secondary | ICD-10-CM

## 2019-08-15 DIAGNOSIS — I5022 Chronic systolic (congestive) heart failure: Secondary | ICD-10-CM

## 2019-08-15 LAB — GLUCOSE, CAPILLARY
Glucose-Capillary: 354 mg/dL — ABNORMAL HIGH (ref 70–99)
Glucose-Capillary: 385 mg/dL — ABNORMAL HIGH (ref 70–99)
Glucose-Capillary: 322 mg/dL — ABNORMAL HIGH (ref 70–99)

## 2019-08-15 MED ORDER — PANTOPRAZOLE SODIUM 40 MG PO TBEC: 40.0000 mg | DELAYED_RELEASE_TABLET | Freq: Every day | ORAL | 0 refills | Status: DC

## 2019-08-15 MED ORDER — ENSURE MAX PROTEIN PO LIQD: 11.0000 [oz_av] | Freq: Every day | ORAL | 0 refills | Status: DC

## 2019-08-15 NOTE — Discharge Instructions (Signed)
No Driving

## 2019-08-15 NOTE — Progress Notes (Signed)
Spoke with patient on the phone. Patient has been on U-500 insulin for a long time and has pens at home. Will be following up with her endocrinologist at [facility] in regards to her insulin dosages. She checks her blood sugars at home about 10 times per day.  Recommend sending her home with the prescription for U-500 insulin pens as listed in her med record.  Harvel Ricks RN BSN CDE Diabetes Coordinator Pager: [phone]  8am-5pm

## 2019-08-15 NOTE — Discharge Summary (Signed)
[place] [facility] [place] NAME: Kristy [name]    MR#:  [id number]  DATE OF BIRTH:  [birth year]  DATE OF ADMISSION:  08/04/2019 ADMITTING PHYSICIAN: Christel Mormon, MD  DATE OF DISCHARGE: 08/15/2019  PRIMARY CARE PHYSICIAN: Owens Loffler, MD    ADMISSION DIAGNOSIS:  AKI (acute kidney injury) ([place]) [N17.9] Hypotension, unspecified hypotension type [I95.9] Diabetic ketoacidosis without coma associated with diabetes mellitus due to underlying condition ([place]) [E08.10]  DISCHARGE DIAGNOSIS:  Active Problems:   AKI (acute kidney injury) ([place])   DKA (diabetic ketoacidoses) (HCC)   Hypotension   Hypernatremia   Hypokalemia   Hypomagnesemia   Encounter for competency evaluation   Weakness   SECONDARY DIAGNOSIS:   Past Medical History:  Diagnosis Date  . Allergic rhinitis   . Allergy   . Cataract    mild   . Crohn's disease ([place]) 10/13/2013  . Diverticulosis   . GERD (gastroesophageal reflux disease)   . Gout   . HFrEF (heart failure with reduced ejection fraction) ([place])    a. 12/2008 Cath: EF 45% w/ inf HK; b. 07/2009 Echo: EF 50-55%; c. 08/2018 Echo: EF 20-25%.  Marland Kitchen Hyperlipidemia   . Hypertension   . IBS (irritable bowel syndrome)   . Left bundle branch block   . Neuromuscular disorder (HCC)    neuropathy  . NICM (nonischemic cardiomyopathy) ([place])    a. 12/2008 Cath: no significant dzs, EF 45% w/ inf HK->Med Rx; b. 07/2009 Echo: EF 50-55%; c. 08/2018 Echo: EF 20-25%, ant/antsept HK, mild MR, mildly dil LA, nl RV fx; d. 08/2018 Cath: D1 80, otw nonobs dzs->Med rx.  . Non-obstructive CAD (coronary artery disease)    a. 12/2008 Cath: no significant dzs, EF 45% w/ inf HK->Med Rx; b. 08/2018 Cath: LM nl, LAD min irregs, D1 80, LCX 82md, RCA min irregs->Med Rx.  . Osteoarthritis   . Poorly controlled Diabetes mellitus    a. 07/2018 A1c 13.1.  .Marland KitchenSymptomatic cholelithiasis   . Uncontrolled type 2 diabetes mellitus with stage 3 chronic kidney  disease, with long-term current use of insulin ([place]          HOSPITAL COURSE:   1.  Diabetic ketoacidosis.  The patient was initially admitted to the ICU and placed on insulin drip.  The patient's hemoglobin A1c is elevated at 10.9.  The patient was put on long-acting insulin plus sliding scale.  The patient wants to go back on her concentrated Humulin R U500 KwikPen sliding scale at home.  I was unable to convince her otherwise.  The diabetic coordinator was okay with this.  She will follow up with her endocrinologist. 2.  Sepsis on presentation with UTI.  Patient received a full course of antibiotics during the hospital course. 3.  Acute metabolic encephalopathy secondary to the patient's elevated sugars.  Patient's mental status has improved.  Patient family concerned about her living alone.  Concerned about her not taking her insulin.  With the nursing staff the patient was able to inject her insulin.  Patient states that she has been doing this for 26 years.  Home health set up and APS referral set up. 4.  Possible coffee-ground emesis.  Can take Protonix for a month and stop 5.  Chronic systolic congestive heart failure with EF 35 to 40%.  On Entresto, Coreg and Aldactone 6.  Acute kidney injury on chronic kidney disease stage II. 7.  Electrolyte abnormalities during the hospital course included hyponatremia, hypomagnesemia, hypophosphatemia and  hypokalemia.  They have all been replaced during the hospital course. 8.  Thrombocytopenia.  Last platelet count 131 9.  Weakness.  Initially physical therapy recommended rehab.  Insurance company denied the rehab.  She did better with physical therapy on the day prior to discharge and then they recommended home with home health.   DISCHARGE CONDITIONS:   Fair  CONSULTS OBTAINED:  Treatment Team:  Dixie Dials, MD  DRUG ALLERGIES:   Allergies  Allergen Reactions  . Fish Oil Other (See Comments)    Gout  . Glimepiride Other (See  Comments)    REACTION: hypoglycemia  . Guanfacine Hcl Other (See Comments)    REACTION: unspecified  . Rosiglitazone Other (See Comments)    CHF    DISCHARGE MEDICATIONS:   Allergies as of 08/15/2019      Reactions   Fish Oil Other (See Comments)   Gout   Glimepiride Other (See Comments)   REACTION: hypoglycemia   Guanfacine Hcl Other (See Comments)   REACTION: unspecified   Rosiglitazone Other (See Comments)   CHF      Medication List    TAKE these medications   acetaminophen 325 MG tablet Commonly known as: TYLENOL Take 325-650 mg by mouth daily as needed for moderate pain or headache.   aspirin 81 MG EC tablet Take 1 tablet (81 mg total) by mouth daily.   atorvastatin 80 MG tablet Commonly known as: LIPITOR Take 1 tablet (80 mg total) by mouth daily at 6 PM.   carvedilol 12.5 MG tablet Commonly known as: COREG Take 1 tablet (12.5 mg total) by mouth 2 (two) times daily.   clopidogrel 75 MG tablet Commonly known as: PLAVIX Take 1 tablet (75 mg total) by mouth daily with breakfast.   Entresto 49-51 MG Generic drug: sacubitril-valsartan TAKE ONE TABLET TWICE DAILY STARTING SUNDAY 09/15/18 What changed: See the new instructions.   feeding supplement (GLUCERNA SHAKE) Liqd Take 237 mLs by mouth 3 (three) times daily with meals.   Ensure Max Protein Liqd Take 330 mLs (11 oz total) by mouth daily.   FLUoxetine 20 MG capsule Commonly known as: PROZAC Take 1 capsule (20 mg total) by mouth daily.   HumuLIN R U-500 KwikPen 500 UNIT/ML kwikpen Generic drug: insulin regular human CONCENTRATED Inject 50-100 Units into the skin 4 (four) times daily -  before meals and at bedtime. Give 0 units insulin for blood sugar < 200  Give 20 units insulin for blood sugar 201 - 300 Give 50 units insulin for blood sugar 301 - 400 Give 90 units insulin for blood sugar 401 - 500 Give 100 units insulin for blood sugar > 500 .   Insulin Syringe-Needle U-100 31G X 5/16" 0.5 ML  Misc Commonly known as: B-D INS SYRINGE 0.5CC/31GX5/16 USE AS DIRECTED THREE TIMES A DAY   ivabradine 5 MG Tabs tablet Commonly known as: Corlanor TAKE ONE TABLET TWICE DAILY WITH MEALS What changed: See the new instructions.   OneTouch Ultra test strip Generic drug: glucose blood Use to check blood sugar up to 8 times a day.   pantoprazole 40 MG tablet Commonly known as: PROTONIX Take 1 tablet (40 mg total) by mouth daily. Start taking on: August 16, 2019   spironolactone 25 MG tablet Commonly known as: ALDACTONE Take 1 tablet (25 mg total) by mouth daily.        DISCHARGE INSTRUCTIONS:   Patient follow-up PMD 5 days Follow-up cardiology 2 weeks Follow-up with your endocrinologist  If you experience  worsening of your admission symptoms, develop shortness of breath, life threatening emergency, suicidal or homicidal thoughts you must seek medical attention immediately by calling 911 or calling your MD immediately  if symptoms less severe.  You Must read complete instructions/literature along with all the possible adverse reactions/side effects for all the Medicines you take and that have been prescribed to you. Take any new Medicines after you have completely understood and accept all the possible adverse reactions/side effects.   Please note  You were cared for by a hospitalist during your hospital stay. If you have any questions about your discharge medications or the care you received while you were in the hospital after you are discharged, you can call the unit and asked to speak with the hospitalist on call if the hospitalist that took care of you is not available. Once you are discharged, your primary care physician will handle any further medical issues. Please note that NO REFILLS for any discharge medications will be authorized once you are discharged, as it is imperative that you return to your primary care physician (or establish a relationship with a primary care  physician if you do not have one) for your aftercare needs so that they can reassess your need for medications and monitor your lab values.    Today   CHIEF COMPLAINT:   Chief Complaint  Patient presents with  . Hematemesis    HISTORY OF PRESENT ILLNESS:  Kristy [name]  is a 68 y.o. female came in initially with hematemesis   VITAL SIGNS:  Blood pressure 107/60, pulse 74, temperature 98.3 F (36.8 C), temperature source Oral, resp. rate 18, height 5' 3"  (1.6 m), weight 64.5 kg, SpO2 97 %.   PHYSICAL EXAMINATION:  GENERAL:  68 y.o.-year-old patient lying in the bed with no acute distress.  EYES: Pupils equal, round, reactive to light and accommodation. No scleral icterus. Extraocular muscles intact.  HEENT: Head atraumatic, normocephalic. Oropharynx and nasopharynx clear.  NECK:  Supple, no jugular venous distention. No thyroid enlargement, no tenderness.  LUNGS: Normal breath sounds bilaterally, no wheezing, rales,rhonchi or crepitation. No use of accessory muscles of respiration.  CARDIOVASCULAR: S1, S2 normal. No murmurs, rubs, or gallops.  ABDOMEN: Soft, non-tender, non-distended. Bowel sounds present. No organomegaly or mass.  EXTREMITIES: No pedal edema, cyanosis, or clubbing.  NEUROLOGIC: Cranial nerves II through XII are intact. Muscle strength 5/5 in all extremities. Sensation intact. Gait not checked.  PSYCHIATRIC: The patient is alert and answers questions appropriately.  SKIN: No obvious rash, lesion, or ulcer.   DATA REVIEW:   CBC Recent Labs  Lab 08/10/19 0459  WBC 13.3*  HGB 16.5*  HCT 52.9*  PLT 131*    Chemistries  Recent Labs  Lab 08/12/19 0624  NA 141  K 4.3  CL 107  CO2 26  GLUCOSE 234*  BUN 16  CREATININE 0.95  CALCIUM 8.7*  MG 1.7     Microbiology Results  Results for orders placed or performed during the hospital encounter of 08/04/19  SARS Coronavirus 2 by RT PCR (hospital order, performed in [facility] hospital lab)  Nasopharyngeal Nasopharyngeal Swab     Status: None   Collection Time: 08/04/19  8:57 PM   Specimen: Nasopharyngeal Swab  Result Value Ref Range Status   SARS Coronavirus 2 NEGATIVE NEGATIVE Final    Comment: (NOTE) If result is NEGATIVE SARS-CoV-2 target nucleic acids are NOT DETECTED. The SARS-CoV-2 RNA is generally detectable in upper and lower  respiratory specimens during the acute  phase of infection. The lowest  concentration of SARS-CoV-2 viral copies this assay can detect is 250  copies / mL. A negative result does not preclude SARS-CoV-2 infection  and should not be used as the sole basis for treatment or other  patient management decisions.  A negative result may occur with  improper specimen collection / handling, submission of specimen other  than nasopharyngeal swab, presence of viral mutation(s) within the  areas targeted by this assay, and inadequate number of viral copies  (<250 copies / mL). A negative result must be combined with clinical  observations, patient history, and epidemiological information. If result is POSITIVE SARS-CoV-2 target nucleic acids are DETECTED. The SARS-CoV-2 RNA is generally detectable in upper and lower  respiratory specimens dur ing the acute phase of infection.  Positive  results are indicative of active infection with SARS-CoV-2.  Clinical  correlation with patient history and other diagnostic information is  necessary to determine patient infection status.  Positive results do  not rule out bacterial infection or co-infection with other viruses. If result is PRESUMPTIVE POSTIVE SARS-CoV-2 nucleic acids MAY BE PRESENT.   A presumptive positive result was obtained on the submitted specimen  and confirmed on repeat testing.  While 2019 novel coronavirus  (SARS-CoV-2) nucleic acids may be present in the submitted sample  additional confirmatory testing may be necessary for epidemiological  and / or clinical management purposes  to  differentiate between  SARS-CoV-2 and other Sarbecovirus currently known to infect humans.  If clinically indicated additional testing with an alternate test  methodology [id number]) is advised. The SARS-CoV-2 RNA is generally  detectable in upper and lower respiratory sp ecimens during the acute  phase of infection. The expected result is Negative. Fact Sheet for Patients:  StrictlyIdeas.no Fact Sheet for Healthcare Providers: BankingDealers.co.za This test is not yet approved or cleared by the [place] FDA and has been authorized for detection and/or diagnosis of SARS-CoV-2 by FDA under an Emergency Use Authorization (EUA).  This EUA will remain in effect (meaning this test can be used) for the duration of the COVID-19 declaration under Section 564(b)(1) of the Act, 21 U.S.C. section 360bbb-3(b)(1), unless the authorization is terminated or revoked sooner. Performed at [facility], [address] [place], [place] [address]   Blood culture (routine x 2)     Status: None   Collection Time: 08/05/19 12:56 AM   Specimen: BLOOD  Result Value Ref Range Status   Specimen Description BLOOD BLOOD LEFT HAND  Final   Special Requests   Final    BOTTLES DRAWN AEROBIC AND ANAEROBIC Blood Culture adequate volume   Culture   Final    NO GROWTH 5 DAYS Performed at [facility], [address] [place], [place] [address]    Report Status 08/10/2019 FINAL  Final  Blood culture (routine x 2)     Status: Abnormal   Collection Time: 08/05/19 12:58 AM   Specimen: BLOOD  Result Value Ref Range Status   Specimen Description   Final    BLOOD BLOOD RIGHT FOREARM Performed at [facility], [address], [place], [place] [address]    Special Requests   Final    BOTTLES DRAWN AEROBIC AND ANAEROBIC Blood Culture results may not be optimal due to an inadequate volume of blood received in culture bottles Performed at  [facility], [address], [place], [place] [address]    Culture  Setup Time   Final    Lonell Grandchild  POSITIVE COCCI ANAEROBIC BOTTLE ONLY CRITICAL RESULT CALLED TO, READ BACK BY AND VERIFIED WITH: [place] 08/06/2019 SNG Performed at [facility], [address] [place], [place] [address]    Culture (A)  Final    STAPHYLOCOCCUS SPECIES (COAGULASE NEGATIVE) THE SIGNIFICANCE OF ISOLATING THIS ORGANISM FROM A SINGLE SET OF BLOOD CULTURES WHEN MULTIPLE SETS ARE DRAWN IS UNCERTAIN. PLEASE NOTIFY THE MICROBIOLOGY DEPARTMENT WITHIN ONE WEEK IF SPECIATION AND SENSITIVITIES ARE REQUIRED. Performed at [facility], [place] [address] [place], [place] [address]    Report Status 08/09/2019 FINAL  Final  MRSA PCR Screening     Status: None   Collection Time: 08/05/19 11:56 AM   Specimen: Nasopharyngeal  Result Value Ref Range Status   MRSA by PCR NEGATIVE NEGATIVE Final    Comment:        The GeneXpert MRSA Assay (FDA approved for NASAL specimens only), is one component of a comprehensive MRSA colonization surveillance program. It is not intended to diagnose MRSA infection nor to guide or monitor treatment for MRSA infections. Performed at [facility], [address], [place], [place] [address]   Urine Culture     Status: None   Collection Time: 08/05/19  4:10 PM   Specimen: Urine, Random  Result Value Ref Range Status   Specimen Description   Final    URINE, RANDOM Performed at [facility], [address] [place], [place] [address]    Special Requests   Final    NONE Performed at [facility], [address], [place], [place] [address]    Culture   Final    NO GROWTH Performed at [facility], [place] [address], [place],  [address]    Report Status 08/06/2019 FINAL  Final  SARS CORONAVIRUS 2 (TAT 6-24 HRS) Nasopharyngeal Nasopharyngeal Swab     Status: None   Collection Time: 08/11/19  6:24 PM    Specimen: Nasopharyngeal Swab  Result Value Ref Range Status   SARS Coronavirus 2 NEGATIVE NEGATIVE Final    Comment: (NOTE) SARS-CoV-2 target nucleic acids are NOT DETECTED. The SARS-CoV-2 RNA is generally detectable in upper and lower respiratory specimens during the acute phase of infection. Negative results do not preclude SARS-CoV-2 infection, do not rule out co-infections with other pathogens, and should not be used as the sole basis for treatment or other patient management decisions. Negative results must be combined with clinical observations, patient history, and epidemiological information. The expected result is Negative. Fact Sheet for Patients: SugarRoll.be Fact Sheet for Healthcare Providers: https://www.woods-mathews.com/ This test is not yet approved or cleared by the [place] FDA and  has been authorized for detection and/or diagnosis of SARS-CoV-2 by FDA under an Emergency Use Authorization (EUA). This EUA will remain  in effect (meaning this test can be used) for the duration of the COVID-19 declaration under Section 56 4(b)(1) of the Act, 21 U.S.C. section 360bbb-3(b)(1), unless the authorization is terminated or revoked sooner. Performed at [facility], [place] [address] [place],  [address]      Management plans discussed with the patient, family and they are in agreement.  CODE STATUS:     Code Status Orders  (From admission, onward)         Start     Ordered   08/04/19 2310  Full code  Continuous     08/04/19 2313        Code Status History  Date Active Date Inactive Code Status Order ID Comments User Context   07/08/2019 2027 07/14/2019 1903 Full Code [id number]  Henreitta Leber, MD Inpatient   04/19/2019 2216 04/28/2019 1910 Full Code [id number]  Loletha Grayer, MD ED   08/26/2018 1151 08/26/2018 1736 Full Code [id number]  Wellington [place], MD Inpatient   05/14/2018 0401 05/15/2018  1421 Full Code [id number]  Lance Coon, MD Inpatient   Advance Care Planning Activity    Advance Directive Documentation     Most Recent Value  Type of Advance Directive  Healthcare Power of Attorney  Pre-existing out of facility DNR order (yellow form or pink MOST form)  -  "MOST" Form in Place?  -      TOTAL TIME TAKING CARE OF THIS PATIENT: 35 minutes.    Loletha Grayer M.D on 08/15/2019 at 3:14 PM  Between 7am to 6pm - Pager - [phone]  After 6pm go to [email or web address] - password [email or web address]  [facility]  CC: Primary care physician; Owens Loffler, MD

## 2019-08-15 NOTE — TOC Transition Note (Addendum)
Transition of Care [facility]) - CM/SW Discharge Note   Patient Details  Name: Kristy [name] MRN: [id number] Date of Birth: [birth year]  [facility] CM/SW Contact:  [name], LCSW Phone Number: 08/15/2019, 10:47 AM   Clinical Narrative:     CSW spoke to patient's daughter yesterday, and informed her that insurance has denied patient placement for SNF even after the peer to peer was completed.  Patient's daughter is agreeable to home health through [facility], family was emailed a list of private pay agencies yesterday, also family requested APS be contacted because they are concerned about her returning back home.  CSW contacted APS and made a report to them regarding family's concerns.  CSW updated physician.  Patient to discharge back home today via taxi, due to family not being able to pick up patient.  3:00pm  APS was meeting with patient, and requested some clinicals to review patient.  Clinical information was faxed to [phone] Trey Paula, her phone number is [phone].     Final next level of care: [place] Barriers to Discharge: Barriers Resolved   Patient Goals and CMS Choice Patient states their goals for this hospitalization and ongoing recovery are:: To return back home with home health. CMS Medicare.gov Compare Post Acute Care list provided to:: Patient Represenative (must comment) Choice offered to / list presented to : Adult Children  Discharge Placement  Patient will be discharging back home with home health through [facility]           Discharge Plan and Services     Post Acute Care Choice: Why          DME Arranged: N/A         HH Arranged: RN, PT, OT, Nurse's Aide, Social Work [employer] Agency: [facility] Date [place]: 08/14/19 Time [place]: 1600 Representative spoke with at [place]: [place] ([place]) Interventions     Readmission  Risk Interventions Readmission Risk Prevention Plan 04/28/2019 04/27/2019  Post Dischage Appt - Complete  Medication Screening - Complete  Transportation Screening - Complete  PCP or Specialist Appt within 5-7 Days Complete -  Home Care Screening Complete -  Medication Review (RN CM) Complete -  Some recent data might be hidden

## 2019-08-15 NOTE — Patient Outreach (Signed)
Per difficult case discussion 08/15/19 , RN CM to continue following pt,  End dates addended and case made active.  Jacqlyn Larsen [facility], Olivia Lopez de Gutierrez Coordinator [phone]

## 2019-08-15 NOTE — Plan of Care (Signed)
Discharge instructions provided to pt.  All questions addressed.  Understanding verified through teach back.  Awaiting transportation home via POV.   Problem: Education: Goal: Knowledge of General Education information will improve Description: Including pain rating scale, medication(s)/side effects and non-pharmacologic comfort measures Outcome: Completed/Met   Problem: Health Behavior/Discharge Planning: Goal: Ability to manage health-related needs will improve Outcome: Completed/Met   Problem: Clinical Measurements: Goal: Ability to maintain clinical measurements within normal limits will improve Outcome: Completed/Met Goal: Will remain free from infection Outcome: Completed/Met Goal: Diagnostic test results will improve Outcome: Completed/Met Goal: Respiratory complications will improve Outcome: Completed/Met Goal: Cardiovascular complication will be avoided Outcome: Completed/Met   Problem: Activity: Goal: Risk for activity intolerance will decrease Outcome: Completed/Met   Problem: Nutrition: Goal: Adequate nutrition will be maintained Outcome: Completed/Met   Problem: Coping: Goal: Level of anxiety will decrease Outcome: Completed/Met   Problem: Elimination: Goal: Will not experience complications related to bowel motility Outcome: Completed/Met Goal: Will not experience complications related to urinary retention Outcome: Completed/Met   Problem: Pain Managment: Goal: General experience of comfort will improve Outcome: Completed/Met   Problem: Safety: Goal: Ability to remain free from injury will improve Outcome: Completed/Met   Problem: Skin Integrity: Goal: Risk for impaired skin integrity will decrease Outcome: Completed/Met   Problem: Education: Goal: Ability to describe self-care measures that may prevent or decrease complications (Diabetes Survival Skills Education) will improve Outcome: Completed/Met Goal: Individualized Educational  Video(s) Outcome: Completed/Met   Problem: Cardiac: Goal: Ability to maintain an adequate cardiac output will improve Outcome: Completed/Met   Problem: Health Behavior/Discharge Planning: Goal: Ability to identify and utilize available resources and services will improve Outcome: Completed/Met Goal: Ability to manage health-related needs will improve Outcome: Completed/Met   Problem: Fluid Volume: Goal: Ability to achieve a balanced intake and output will improve Outcome: Completed/Met   Problem: Metabolic: Goal: Ability to maintain appropriate glucose levels will improve Outcome: Completed/Met   Problem: Nutritional: Goal: Maintenance of adequate nutrition will improve Outcome: Completed/Met Goal: Maintenance of adequate weight for body size and type will improve Outcome: Completed/Met   Problem: Respiratory: Goal: Will regain and/or maintain adequate ventilation Outcome: Completed/Met   Problem: Urinary Elimination: Goal: Ability to achieve and maintain adequate renal perfusion and functioning will improve Outcome: Completed/Met   Problem: Inadequate Intake (NI-2.1) Goal: Food and/or nutrient delivery Description: Individualized approach for food/nutrient provision. Outcome: Completed/Met   Problem: Acute Rehab PT Goals(only PT should resolve) Goal: Pt Will Go Sit To Supine/Side Outcome: Completed/Met Goal: Pt Will Transfer Bed To Chair/Chair To Bed Outcome: Completed/Met Goal: Pt Will Ambulate Outcome: Completed/Met Goal: Pt Will Go Up/Down Stairs Outcome: Completed/Met   Problem: Acute Rehab OT Goals (only OT should resolve) Goal: Pt. Will Perform Lower Body Dressing Outcome: Completed/Met Goal: Pt. Will Transfer To Toilet Outcome: Completed/Met Goal: OT Additional ADL Goal #1 Outcome: Completed/Met

## 2019-08-15 NOTE — Patient Outreach (Addendum)
Per difficult case discussion RN CM to continue to follow as pt is high risk,  RN CM called [facility] in [place], spoke with Deeann who reported at the last minute pt did not arrive at their facility due to her insurance company denied.  Pt was supposed to be discharged to home with home health.  Outreach call to pt for follow up, no answer to telephone, left voicemail requesting return phone call.  RN CM mailed unsuccessful outreach letter to pt home.  Jacqlyn Larsen [facility], [place] Coordinator [phone]

## 2019-08-15 NOTE — Progress Notes (Signed)
Patient requested to have her blood glucose checked via fingerstick. Stated she felt like her sugar level was "off." STAT CBG = 354. Notified NP on-call, Rufina Falco. No order received to cover with insulin now but instead to wait until breakfast to prevent hypoglycemia. Notified patient. Verbalized understanding.

## 2019-08-18 ENCOUNTER — Other Ambulatory Visit: Payer: Self-pay | Admitting: Pharmacist

## 2019-08-18 ENCOUNTER — Ambulatory Visit: Payer: Self-pay | Admitting: Pharmacist

## 2019-08-18 ENCOUNTER — Telehealth: Payer: Self-pay

## 2019-08-18 NOTE — Addendum Note (Signed)
Addended by: Ralene Bathe E on: 08/18/2019 01:18 PM   Modules accepted: Orders

## 2019-08-18 NOTE — Telephone Encounter (Signed)
Left message for patient to call back to complete TCM call.  Appointment set up already with PCP on 08/20/2019.

## 2019-08-18 NOTE — Patient Outreach (Signed)
[facility]) Care Management  [place]   08/18/2019  Kristy [name] Sep 22, 1951 [id number]  Reason for referral: Medication Management (repeat referral as patient declined SNF after last hospitalization)  Referral source: [facility] RN Current insurance: [employer]  PMHx includes but not limited to:  Uncontrolled T2DM with neuropathy, hx DKA, CKD-III, HTN, HLD, NICM (EF 50-55% 3/'20), CHF, CAD, LBBB, gout, arthritis, Crohn's disease, GERD, MDD, former smoker (22.5 pack year history), baker's cyst left knee, severe gait instability leading to falls, recent hospitalization 10/6-10/12 for UTI.    Per notes, patient has seen several endocrinologists in the past including [name], Dr. Gabriel Carina, and Dr. Rutherford Nail from [facility] with gaps in f/u care.  Currently taking U-500 regular SSI.       Per notes from endocrinology on 12/02/18, patient was provided CGM sensor trial, MD also had plans to change to Humulin U-500 pen rather than vial.  Patient did not f/u with provider after appt.    Patient has appt with a new endocrinologist, Dr. Reggie Pile, from [place] scheduled on 08/20/2019.  Patient stated she will inquire about CGM with new endocrinologist in Nov.   -Uses [facility] in [place], recent PA for test strips approved for patient testing CBGs up to 8x daily.  -May have been approved by [employer] for patient assistance program in the past.  -A1C improved 15.5--> 10.9 in the last 3 months.  -Has reported to providers that "none of her diabetes medications work."    Outreach:  Successful telephone call with patient.  HIPAA identifiers verified.   Subjective:  Patient reports she has reviewed her medications "many" times in the last few days but can go over them again with me today if needed.  She reports she will start physical therapy rehab this week.  She states she was due to renew her license while she was hospitalized so now cannot drive until she has  license re-instated.  She will therefore miss her endocrinology appt at [facility] on 11/18.  She reports she has tried short / long acting insulin combinations in the past and "it doesn't work for my body."  She reports she only feels comfortable using U-500 insulin right now.  She also reports she was approved for CGM through [place] but that it was order was "cancelled" when [facility] shut down for COVID.  She states PCP office has been assisting her annually with [employer] patient assistance program application.     Objective: The ASCVD Risk score Mikey Bussing DC Jr., et al., 2013) failed to calculate for the following reasons:   The patient has a prior MI or stroke diagnosis  Lab Results  Component Value Date   CREATININE 0.95 08/12/2019   CREATININE 0.76 08/11/2019   CREATININE 0.74 08/10/2019    Lab Results  Component Value Date   HGBA1C 10.9 (H) 07/08/2019    Lipid Panel     Component Value Date/Time   CHOL 170 04/22/2019 0700   TRIG 209 (H) 04/22/2019 0700   HDL 25 (L) 04/22/2019 0700   CHOLHDL 6.8 04/22/2019 0700   VLDL 42 (H) 04/22/2019 0700   LDLCALC 103 (H) 04/22/2019 0700   LDLDIRECT 192.0 07/11/2018 0846    BP Readings from Last 3 Encounters:  08/15/19 124/68  07/21/19 112/70  07/14/19 129/63    Allergies  Allergen Reactions  . Fish Oil Other (See Comments)    Gout  . Glimepiride Other (See Comments)    REACTION: hypoglycemia  .  Guanfacine Hcl Other (See Comments)    REACTION: unspecified  . Rosiglitazone Other (See Comments)    CHF    Medications Reviewed Today    Reviewed by Casimer Lanius, CPhT (Pharmacy Technician) on 08/04/19 at 2256  Med List Status: Complete  Medication Order Taking? Sig Documenting Provider Last Dose Status Informant  acetaminophen (TYLENOL) 325 MG tablet [id number] Yes Take 325-650 mg by mouth daily as needed for moderate pain or headache.  [provider] prn prn Active Self  aspirin EC 81 MG EC tablet [id number] Yes Take 1 tablet  (81 mg total) by mouth daily. Hillary Bow, MD Past Week Unknown time Active Self  atorvastatin (LIPITOR) 80 MG tablet [id number] No Take 1 tablet (80 mg total) by mouth daily at 6 PM.  Patient not taking: Reported on 07/31/2019   Minna Merritts, MD Not Taking Unknown time Consider Medication Status and Discontinue (Discontinued by provider) Self  carvedilol (COREG) 12.5 MG tablet [id number] Yes Take 1 tablet (12.5 mg total) by mouth 2 (two) times daily. Minna Merritts, MD Past Week Unknown time Active Self  clopidogrel (PLAVIX) 75 MG tablet [id number] Yes Take 1 tablet (75 mg total) by mouth daily with breakfast. Minna Merritts, MD Past Week Unknown time Active Self  CORLANOR 5 MG TABS tablet [id number] Yes TAKE ONE TABLET TWICE DAILY WITH MEALS  Patient taking differently: Take 5 mg by mouth 2 (two) times daily with a meal.    Theora Gianotti, NP Past Week Unknown time Active Self  ENTRESTO 49-51 MG [id number] Yes TAKE ONE TABLET TWICE DAILY STARTING SUNDAY 09/15/18  Patient taking differently: Take 1 tablet by mouth 2 (two) times daily.    Minna Merritts, MD Past Week Unknown time Active Self  FLUoxetine (PROZAC) 20 MG capsule [id number] Yes Take 1 capsule (20 mg total) by mouth daily. Copland, [name], MD Past Week Unknown time Active Self  glucose blood (ONETOUCH ULTRA) test strip [id number]  Use to check blood sugar up to 8 times a day. Copland, [name], MD  Active Self  insulin regular human CONCENTRATED (HUMULIN R U-500 KWIKPEN) 500 UNIT/ML Claiborne Rigg [id number] Yes Inject 50-100 Units into the skin 4 (four) times daily -  before meals and at bedtime. Give 0 units insulin for blood sugar < 200  Give 20 units insulin for blood sugar 201 - 300 Give 50 units insulin for blood sugar 301 - 400 Give 90 units insulin for blood sugar 401 - 500 Give 100 units insulin for blood sugar > 500 . Gladstone Lighter, MD Past Week Unknown time Active Self  Insulin Syringe-Needle U-100 (B-D INS  SYRINGE 0.5CC/31GX5/16) 31G X 5/16" 0.5 ML MISC [id number]  USE AS DIRECTED THREE TIMES A DAY Copland, Spencer, MD  Active Self  spironolactone (ALDACTONE) 25 MG tablet [id number] Yes Take 1 tablet (25 mg total) by mouth daily. Minna Merritts, MD Past Week Unknown time Active Self          Assessment: Drugs sorted by system:  Neurologic/Psychologic: fluoxetine   Hematologic: aspirin, clopidogrel  Cardiovascular: carvedilol, valsartan -sacubitril, ivabradine, spironolactone  Gastrointestinal: Ensure supplement, pantoprazole  Endocrine: U-500 insulin  Pain: acetaminophen  Medication Review Findings:  . Atorvastatin:  Has not taken for several months due to muscle pain.  Patient does not wish to hear about other options including changing formulation or reducing frequency of administration.  Will update provider and remove medication from active list.  . Clopidogrel:  Patient reports bruising on arms from hospitalization, we  reviewed signs and symptoms of bleeding and how to monitor.   . Pantoprazole: Patient not currently taking medication.  Reports she wants to discuss this with her cardiologist first.   . Blood glucose:  Checking up to 8x / day.  Per patient, approved for CGM earlier this year.  Encouraged patient to call [place] office to re-instate order for CGM as this will be very helpful in assessing diabetes management.  Patient voiced understanding.  She states she will call to reschedule appt and inquire again about CGM.   Marland Kitchen Transportation:  Unable to drive to medical appointments at this time due to inactive license.  Will update [facility] RN to see if LCSW referral appropriate.   . Medication education:  I reviewed insulin therapy with patient and appropriate diabetes diet.  Patient may be willing to consider other insulin formulations and diabetes medications after she sees her endocrinologist again.  At this time, she is going to continue current strategy with Humulin U-500.  She will  need close f/u with [place] endocrinology.  Encouraged her several times to make sure she reschedules appt.   . Patient denies having medication questions or concerns and declines need for further [place] involvement.    Plan: . Will close [facility] pharmacy case as no further medication needs identified at this time.  Am happy to assist in the future as needed.    . Will route note to providers  Ralene Bathe, PharmD, [place] [phone]

## 2019-08-19 ENCOUNTER — Encounter: Payer: Self-pay | Admitting: *Deleted

## 2019-08-19 ENCOUNTER — Other Ambulatory Visit: Payer: Self-pay

## 2019-08-19 ENCOUNTER — Other Ambulatory Visit: Payer: Self-pay | Admitting: *Deleted

## 2019-08-19 NOTE — Patient Outreach (Addendum)
Outreach call for post hospital follow up, discharged on 08/13/19 after hospitalization DKA with CBG of 1324 upon arrival to hospital, Primary MD completes transition of care, RN CM spoke with pt, HIPAA verified, upon questioning about CBG readings, pt states "it's been high for 20 years"  Pt states her blood sugar continually runs high and she does not seem at all concerned about this fact.  Pt states she does not have transportation to MD appointment tomorrow " because my niece and step daughter don't like me very much right now"  Pt states they want her in a facility and pt is adamant that she is not going to a facility,  Pt does not have any other relatives to assist her and states she has adequate funds to pay for care in the home.  Pt states she wants to live her life the way she wants to live it and wishes people would leave her alone as these are her wishes.  Pt reports she lost license recently while in hospital due to license lapsed.  RN CM informed pt that [place] worker tried to call her and left a voicemail about assisting with transportation, RN CM ask pt to listen to voicemail and call social worker back and to answer the phone as there will be varying health care providers calling her.  Per previous notes in EMR, APS is to be involved with pt,  Pt did not giver permission to speak with any of her other family members.  RN CM sent message to [place] with update and pt continues to need assistance with transportation.  [facility] CM Care Plan Problem One     Most Recent Value  Care Plan Problem One  Knowledge deficit related to diabetes  Role Documenting the Problem One  Care Management [place] for Problem One  Active  THN Long Term Goal   Pt will demonstrate improved self care related to diabetes aeb decrease in Hgb AIC 1-2 points in 60 days  THN Long Term Goal Start Date  07/31/19  Interventions for Problem One Long Term Goal  RN CM reviewed plan of care with pt  including upcoming appointments and importance of attending these,  reviewed level of care issues and pt states she is staying in her and not going anywhere, pt feels she is ambulating better  THN CM Short Term Goal #1   Pt will verbalize/ utilize plate method wtihin 30 days  THN CM Short Term Goal #1 Start Date  07/31/19  Interventions for Short Term Goal #1  RN CM reinforced plate method, portion control, importanc of choosing nutritious food choices and being aware of food high in carbohydrates  THN CM Short Term Goal #2   Pt will attend endocrinologist appointment at [facility] on 08/20/19 and discuss freestyle Libre within 30 days  THN CM Short Term Goal #2 Start Date  07/31/19  Interventions for Short Term Goal #2  Pt does not know if she will be attending this appointment,  RN CM urged pt to answer the phone when [facility] social worker calls to assist with transportation, pt does not feel it is needed or helpful to attend MD appointments    [facility] CM Care Plan Problem Two     Most Recent Value  Care Plan Problem Two  Pt high risk for falls  Role Documenting the Problem Two  Care Management [place] for Problem Two  Active  [facility] CM Short Term Goal #  1   Pt will verbalize/ utilize safety precautions within 30 days  THN CM Short Term Goal #1 Start Date  07/31/19      PLAN Outreach pt for telephone assessment in 2 weeks  Jacqlyn Larsen [facility], [place] Coordinator [phone]

## 2019-08-19 NOTE — Telephone Encounter (Signed)
Unable to reach patient at time of TCM Call. Left message for patient to return call when available.  

## 2019-08-19 NOTE — Patient Outreach (Addendum)
[facility]) Care Management  08/19/2019  DEBANY VANTOL 05/04/1951 [id number]   Social work referral received at 4:19 PM yesterday regarding need for transportation.  "Patient with inactive license and unable to make endo appt on 11/18 with [facility]..can you please see if there are any resources to help with this? She hasn't been seen in months and was recently hospitalized with CBG> 1300, [facility]. Could really benefit from appt asap. " Attempted to contact patient at 8:51 AM but had to leave a voicemail message.  Sent message to staff with [facility] to determine if they can accommodate transport from [place] to [place].  Will attempt to reach patient again when response is received.   Addendum: Received response from [place] [facility] at 2:26 PM that they are able to transport from [place] to [place], however, they cannot accommodate trip for tomorrow. Attempted to contact patient again but had to leave voicemail message.  Will try to reach her again before the end of the week to further discuss transportation options.    Ronn Melena, BSW Social Worker [phone]

## 2019-08-20 ENCOUNTER — Inpatient Hospital Stay: Payer: Medicare HMO | Admitting: Family Medicine

## 2019-08-20 NOTE — Telephone Encounter (Signed)
Patient has appointment today and were not able to get in touch with patient in 2 day mark. Nothing further needed at this time.

## 2019-08-21 ENCOUNTER — Inpatient Hospital Stay
Admission: EM | Admit: 2019-08-21 | Discharge: 2019-09-23 | DRG: 637 | Disposition: A | Discharge status: Skilled Nursing Facility | Payer: Medicare HMO | Attending: Internal Medicine | Admitting: Internal Medicine

## 2019-08-21 ENCOUNTER — Ambulatory Visit: Payer: Medicare HMO

## 2019-08-21 ENCOUNTER — Emergency Department: Payer: Medicare HMO

## 2019-08-21 ENCOUNTER — Ambulatory Visit: Payer: Self-pay | Admitting: *Deleted

## 2019-08-21 ENCOUNTER — Other Ambulatory Visit: Payer: Self-pay

## 2019-08-21 ENCOUNTER — Telehealth: Payer: Self-pay

## 2019-08-21 DIAGNOSIS — I248 Other forms of acute ischemic heart disease: Secondary | ICD-10-CM | POA: Diagnosis present

## 2019-08-21 DIAGNOSIS — K5090 Crohn's disease, unspecified, without complications: Secondary | ICD-10-CM | POA: Diagnosis present

## 2019-08-21 DIAGNOSIS — E1100 Type 2 diabetes mellitus with hyperosmolarity without nonketotic hyperglycemic-hyperosmolar coma (NKHHC): Principal | ICD-10-CM | POA: Diagnosis present

## 2019-08-21 DIAGNOSIS — N1831 Chronic kidney disease, stage 3a: Secondary | ICD-10-CM | POA: Diagnosis present

## 2019-08-21 DIAGNOSIS — R45851 Suicidal ideations: Secondary | ICD-10-CM | POA: Diagnosis not present

## 2019-08-21 DIAGNOSIS — F419 Anxiety disorder, unspecified: Secondary | ICD-10-CM | POA: Diagnosis present

## 2019-08-21 DIAGNOSIS — R0902 Hypoxemia: Secondary | ICD-10-CM | POA: Diagnosis not present

## 2019-08-21 DIAGNOSIS — E1122 Type 2 diabetes mellitus with diabetic chronic kidney disease: Secondary | ICD-10-CM

## 2019-08-21 DIAGNOSIS — M109 Gout, unspecified: Secondary | ICD-10-CM | POA: Diagnosis present

## 2019-08-21 DIAGNOSIS — IMO0002 Reserved for concepts with insufficient information to code with codable children: Secondary | ICD-10-CM

## 2019-08-21 DIAGNOSIS — M159 Polyosteoarthritis, unspecified: Secondary | ICD-10-CM | POA: Diagnosis present

## 2019-08-21 DIAGNOSIS — Z8249 Family history of ischemic heart disease and other diseases of the circulatory system: Secondary | ICD-10-CM

## 2019-08-21 DIAGNOSIS — R000 Tachycardia, unspecified: Secondary | ICD-10-CM | POA: Diagnosis not present

## 2019-08-21 DIAGNOSIS — K219 Gastro-esophageal reflux disease without esophagitis: Secondary | ICD-10-CM | POA: Diagnosis present

## 2019-08-21 DIAGNOSIS — E1165 Type 2 diabetes mellitus with hyperglycemia: Secondary | ICD-10-CM

## 2019-08-21 DIAGNOSIS — I2510 Atherosclerotic heart disease of native coronary artery without angina pectoris: Secondary | ICD-10-CM | POA: Diagnosis present

## 2019-08-21 DIAGNOSIS — I10 Essential (primary) hypertension: Secondary | ICD-10-CM | POA: Diagnosis present

## 2019-08-21 DIAGNOSIS — J40 Bronchitis, not specified as acute or chronic: Secondary | ICD-10-CM | POA: Diagnosis present

## 2019-08-21 DIAGNOSIS — I130 Hypertensive heart and chronic kidney disease with heart failure and stage 1 through stage 4 chronic kidney disease, or unspecified chronic kidney disease: Secondary | ICD-10-CM | POA: Diagnosis present

## 2019-08-21 DIAGNOSIS — D696 Thrombocytopenia, unspecified: Secondary | ICD-10-CM

## 2019-08-21 DIAGNOSIS — A419 Sepsis, unspecified organism: Secondary | ICD-10-CM | POA: Diagnosis present

## 2019-08-21 DIAGNOSIS — Z888 Allergy status to other drugs, medicaments and biological substances status: Secondary | ICD-10-CM

## 2019-08-21 DIAGNOSIS — R7989 Other specified abnormal findings of blood chemistry: Secondary | ICD-10-CM | POA: Diagnosis present

## 2019-08-21 DIAGNOSIS — N179 Acute kidney failure, unspecified: Secondary | ICD-10-CM | POA: Diagnosis present

## 2019-08-21 DIAGNOSIS — F3342 Major depressive disorder, recurrent, in full remission: Secondary | ICD-10-CM | POA: Diagnosis present

## 2019-08-21 DIAGNOSIS — R404 Transient alteration of awareness: Secondary | ICD-10-CM | POA: Diagnosis not present

## 2019-08-21 DIAGNOSIS — Z9114 Patient's other noncompliance with medication regimen: Secondary | ICD-10-CM

## 2019-08-21 DIAGNOSIS — I428 Other cardiomyopathies: Secondary | ICD-10-CM

## 2019-08-21 DIAGNOSIS — I5022 Chronic systolic (congestive) heart failure: Secondary | ICD-10-CM | POA: Diagnosis present

## 2019-08-21 DIAGNOSIS — G9341 Metabolic encephalopathy: Secondary | ICD-10-CM | POA: Diagnosis present

## 2019-08-21 DIAGNOSIS — Z841 Family history of disorders of kidney and ureter: Secondary | ICD-10-CM

## 2019-08-21 DIAGNOSIS — Z91013 Allergy to seafood: Secondary | ICD-10-CM

## 2019-08-21 DIAGNOSIS — R778 Other specified abnormalities of plasma proteins: Secondary | ICD-10-CM | POA: Diagnosis present

## 2019-08-21 DIAGNOSIS — E785 Hyperlipidemia, unspecified: Secondary | ICD-10-CM | POA: Diagnosis present

## 2019-08-21 DIAGNOSIS — Z9119 Patient's noncompliance with other medical treatment and regimen: Secondary | ICD-10-CM

## 2019-08-21 DIAGNOSIS — Z7982 Long term (current) use of aspirin: Secondary | ICD-10-CM

## 2019-08-21 DIAGNOSIS — E870 Hyperosmolality and hypernatremia: Secondary | ICD-10-CM | POA: Diagnosis present

## 2019-08-21 DIAGNOSIS — Z03818 Encounter for observation for suspected exposure to other biological agents ruled out: Secondary | ICD-10-CM | POA: Diagnosis not present

## 2019-08-21 DIAGNOSIS — Z20828 Contact with and (suspected) exposure to other viral communicable diseases: Secondary | ICD-10-CM | POA: Diagnosis present

## 2019-08-21 DIAGNOSIS — Z7902 Long term (current) use of antithrombotics/antiplatelets: Secondary | ICD-10-CM

## 2019-08-21 DIAGNOSIS — Z794 Long term (current) use of insulin: Secondary | ICD-10-CM

## 2019-08-21 DIAGNOSIS — Z87891 Personal history of nicotine dependence: Secondary | ICD-10-CM

## 2019-08-21 DIAGNOSIS — R4182 Altered mental status, unspecified: Secondary | ICD-10-CM | POA: Diagnosis not present

## 2019-08-21 DIAGNOSIS — F4325 Adjustment disorder with mixed disturbance of emotions and conduct: Secondary | ICD-10-CM | POA: Diagnosis present

## 2019-08-21 DIAGNOSIS — N183 Chronic kidney disease, stage 3 unspecified: Secondary | ICD-10-CM | POA: Diagnosis present

## 2019-08-21 DIAGNOSIS — I447 Left bundle-branch block, unspecified: Secondary | ICD-10-CM | POA: Diagnosis present

## 2019-08-21 LAB — URINALYSIS, COMPLETE (UACMP) WITH MICROSCOPIC
Bacteria, UA: NONE SEEN
Bilirubin Urine: NEGATIVE
Glucose, UA: >=500 mg/dL — AB
Ketones, ur: 5 mg/dL — AB
Leukocytes,Ua: NEGATIVE
Nitrite: NEGATIVE
Protein, ur: NEGATIVE mg/dL
Specific Gravity, Urine: 1.026 (ref 1.005–1.030)
WBC, UA: NONE SEEN WBC/hpf (ref 0–5)
pH: 5 (ref 5.0–8.0)

## 2019-08-21 LAB — BLOOD GAS, VENOUS
Acid-base deficit: 3.7 mmol/L — ABNORMAL HIGH (ref 0.0–2.0)
Bicarbonate: 23.5 mmol/L (ref 20.0–28.0)
O2 Saturation: 60.3 %
Patient temperature: 37
pCO2, Ven: 50 mmHg (ref 44.0–60.0)
pH, Ven: 7.28 (ref 7.250–7.430)
pO2, Ven: 36 mmHg (ref 32.0–45.0)

## 2019-08-21 LAB — CBC WITH DIFFERENTIAL/PLATELET
Abs Immature Granulocytes: 0.1 10*3/uL — ABNORMAL HIGH (ref 0.00–0.07)
Basophils Absolute: 0.1 10*3/uL (ref 0.0–0.1)
Basophils Relative: 0 %
Eosinophils Absolute: 0 10*3/uL (ref 0.0–0.5)
Eosinophils Relative: 0 %
HCT: 53 % — ABNORMAL HIGH (ref 36.0–46.0)
Hemoglobin: 15.2 g/dL — ABNORMAL HIGH (ref 12.0–15.0)
Immature Granulocytes: 1 %
Lymphocytes Relative: 13 %
Lymphs Abs: 2.4 10*3/uL (ref 0.7–4.0)
MCH: 28.8 pg (ref 26.0–34.0)
MCHC: 28.7 g/dL — ABNORMAL LOW (ref 30.0–36.0)
MCV: 100.6 fL — ABNORMAL HIGH (ref 80.0–100.0)
Monocytes Absolute: 1.4 10*3/uL — ABNORMAL HIGH (ref 0.1–1.0)
Monocytes Relative: 8 %
Neutro Abs: 14.8 10*3/uL — ABNORMAL HIGH (ref 1.7–7.7)
Neutrophils Relative %: 78 %
Platelets: 319 10*3/uL (ref 150–400)
RBC: 5.27 MIL/uL — ABNORMAL HIGH (ref 3.87–5.11)
RDW: 13.9 % (ref 11.5–15.5)
WBC: 18.7 10*3/uL — ABNORMAL HIGH (ref 4.0–10.5)
nRBC: 0 % (ref 0.0–0.2)

## 2019-08-21 LAB — GLUCOSE, CAPILLARY
Glucose-Capillary: >600 mg/dL (ref 70–99)
Glucose-Capillary: >600 mg/dL (ref 70–99)
Glucose-Capillary: >600 mg/dL (ref 70–99)
Glucose-Capillary: >600 mg/dL (ref 70–99)
Glucose-Capillary: >600 mg/dL (ref 70–99)

## 2019-08-21 LAB — SARS CORONAVIRUS 2 BY RT PCR (HOSPITAL ORDER, PERFORMED IN ~~LOC~~ HOSPITAL LAB): SARS Coronavirus 2: NEGATIVE

## 2019-08-21 LAB — LACTIC ACID, PLASMA
Lactic Acid, Venous: 7.6 mmol/L (ref 0.5–1.9)
Lactic Acid, Venous: 4.1 mmol/L (ref 0.5–1.9)

## 2019-08-21 LAB — TROPONIN I (HIGH SENSITIVITY)
Troponin I (High Sensitivity): 18 ng/L — ABNORMAL HIGH (ref <18)
Troponin I (High Sensitivity): 22 ng/L — ABNORMAL HIGH (ref <18)

## 2019-08-21 MED ORDER — DEXTROSE 50 % IV SOLN: 0.0000 mL | INTRAVENOUS | Status: DC | PRN

## 2019-08-21 MED ORDER — INSULIN REGULAR(HUMAN) IN NACL 100-0.9 UT/100ML-% IV SOLN
INTRAVENOUS | Status: DC
  Administered 2019-08-21: 8.5 [IU]/h via INTRAVENOUS
  Filled 2019-08-21: qty 100

## 2019-08-21 MED ORDER — SODIUM CHLORIDE 0.9 % IV BOLUS
1000.0000 mL | Freq: Once | INTRAVENOUS | Status: AC
  Administered 2019-08-21: 1000 mL via INTRAVENOUS

## 2019-08-21 MED ORDER — VANCOMYCIN HCL IN DEXTROSE 1-5 GM/200ML-% IV SOLN: 1000.0000 mg | Freq: Once | INTRAVENOUS | Status: DC

## 2019-08-21 MED ORDER — VANCOMYCIN HCL 1.25 G IV SOLR
1250.0000 mg | Freq: Once | INTRAVENOUS | Status: AC
  Administered 2019-08-21: 1250 mg via INTRAVENOUS
  Filled 2019-08-21: qty 1250

## 2019-08-21 MED ORDER — SODIUM CHLORIDE 0.9 % IV SOLN
2.0000 g | Freq: Once | INTRAVENOUS | Status: AC
  Administered 2019-08-21: 2 g via INTRAVENOUS
  Filled 2019-08-21 (×2): qty 2

## 2019-08-21 MED ORDER — SODIUM CHLORIDE 0.9 % IV SOLN
Freq: Once | INTRAVENOUS | Status: AC
  Administered 2019-08-21: 22:00:00 via INTRAVENOUS

## 2019-08-21 NOTE — ED Notes (Signed)
Pharm messaged about ED pyxis being out of cefepime. Will hang once received.

## 2019-08-21 NOTE — ED Notes (Signed)
[place] of blood tube sent to lab including RT VBG sent on ice.

## 2019-08-21 NOTE — ED Triage Notes (Signed)
Pt in via EMS from home d/t AMS/inc BG. BG with EMS in 600s then 331 post 500cc NS bolus with EMS. Pt given 77m Zofran by EMS. Pt was actively vomiting when EMS arrived.

## 2019-08-21 NOTE — ED Notes (Signed)
Pt placed back on 2L O2 via [place] as she desat at times and doesn't consistently maintain O2 sat.

## 2019-08-21 NOTE — ED Notes (Signed)
Pt remains at rate of 8.5 on insulin gtt per Endotool.

## 2019-08-21 NOTE — Telephone Encounter (Signed)
Called patient 3 times trying to complete her Medicare Wellness visit and she never answered the phone. Voicemail box was full and I was unable to leave a message. Appointment was cancelled.

## 2019-08-21 NOTE — ED Notes (Signed)
Phlebotomy at bedside. Will hang antibiotics once done.

## 2019-08-21 NOTE — ED Notes (Signed)
EDP Siadecki notified of inc lactic and that HR remains in 130s ST.

## 2019-08-21 NOTE — ED Notes (Signed)
Called pharm about missing antibiotic.

## 2019-08-21 NOTE — Consult Note (Signed)
PHARMACY -  BRIEF ANTIBIOTIC NOTE   Pharmacy has received consult(s) for Dr Cherylann Banas from an ED provider.  The patient's profile has been reviewed for ht/wt/allergies/indication/available labs.    One time order(s) placed for:  1250 mg IV vancomycin 2 grams IV cefepime  Further antibiotics/pharmacy consults should be ordered by admitting physician if indicated.                       Thank you,  Dallie Piles, PharmD 08/21/2019  7:44 PM

## 2019-08-21 NOTE — ED Notes (Signed)
Pt urinated in bed. Peri care provided. Bed pad and linens changed.

## 2019-08-21 NOTE — ED Notes (Signed)
Cefepime not finished yet as pt keeps bending arm and turning around in bed. Pt took of cardiac monitor. Educated why pt needs to leave in place.

## 2019-08-21 NOTE — ED Notes (Signed)
This RN entered room and pt found chewing on [place]. Reoriented pt and explained to leave [place] in nares to receive oxygen.

## 2019-08-21 NOTE — ED Notes (Signed)
Per Endotool pt to remain at insulin gtt rate of 8.5.

## 2019-08-21 NOTE — Progress Notes (Signed)
CODE SEPSIS - PHARMACY COMMUNICATION  **Broad Spectrum Antibiotics should be administered within 1 hour of Sepsis diagnosis**  Time Code Sepsis Called/Page Received:  11/19 @ 1940   Antibiotics Ordered:  Cefepime , Vancomycin   Time of 1st antibiotic administration:  Cefepime 2 gm IV x 1 @ 2046   Additional action taken by pharmacy:   If necessary, Name of Provider/Nurse Contacted:     Petros Ahart D ,PharmD Clinical Pharmacist  08/21/2019  9:38 PM

## 2019-08-21 NOTE — ED Notes (Signed)
Pt given two warm blankets.

## 2019-08-21 NOTE — ED Notes (Signed)
Pt A&O to self only.

## 2019-08-21 NOTE — ED Notes (Signed)
Pt fighting this RN while trying to reapply her [place]. While pt off oxygen found to be 100% RA. Will leave on RA.

## 2019-08-21 NOTE — ED Provider Notes (Signed)
[facility] Emergency Department Provider Note ____________________________________________   First MD Initiated Contact with Patient 08/21/19 1705     (approximate)  I have reviewed the triage vital signs and the nursing notes.   HISTORY  Chief Complaint Altered Mental Status and Hyperglycemia  Level 5 caveat: History of present illness limited due to altered mental status  HPI Kristy [name] is a 68 y.o. female who presents from home with altered mental status, increased weakness, and elevated blood sugar of unknown duration.  The patient was recently discharged from the hospital.  Per EMS, she was vomiting on their arrival and had a blood glucose in the 600s.  The patient is confused and unable to give any history.  Past Medical History:  Diagnosis Date  . Allergic rhinitis   . Allergy   . Cataract    mild   . Crohn's disease ([place]) 10/13/2013  . Diverticulosis   . GERD (gastroesophageal reflux disease)   . Gout   . HFrEF (heart failure with reduced ejection fraction) ([place])    a. 12/2008 Cath: EF 45% w/ inf HK; b. 07/2009 Echo: EF 50-55%; c. 08/2018 Echo: EF 20-25%.  Marland Kitchen Hyperlipidemia   . Hypertension   . IBS (irritable bowel syndrome)   . Left bundle branch block   . Neuromuscular disorder (HCC)    neuropathy  . NICM (nonischemic cardiomyopathy) ([place])    a. 12/2008 Cath: no significant dzs, EF 45% w/ inf HK->Med Rx; b. 07/2009 Echo: EF 50-55%; c. 08/2018 Echo: EF 20-25%, ant/antsept HK, mild MR, mildly dil LA, nl RV fx; d. 08/2018 Cath: D1 80, otw nonobs dzs->Med rx.  . Non-obstructive CAD (coronary artery disease)    a. 12/2008 Cath: no significant dzs, EF 45% w/ inf HK->Med Rx; b. 08/2018 Cath: LM nl, LAD min irregs, D1 80, LCX 16md, RCA min irregs->Med Rx.  . Osteoarthritis   . Poorly controlled Diabetes mellitus    a. 07/2018 A1c 13.1.  .Marland KitchenSymptomatic cholelithiasis   . Uncontrolled type 2 diabetes mellitus with stage 3 chronic kidney  disease, with long-term current use of insulin ([facility]          Patient Active Problem List   Diagnosis Date Noted  . Hypokalemia   . Hypomagnesemia   . Encounter for competency evaluation   . Weakness   . Hypotension   . Hypernatremia   . Sepsis secondary to UTI ([place]   . DKA (diabetic ketoacidoses) ([place] 04/19/2019  . NICM (nonischemic cardiomyopathy) ([place] 11/08/2018  . Chronic systolic CHF (congestive heart failure) ([place] 08/22/2018  . AKI (acute kidney injury) ([place] 05/13/2018  . Coronary artery disease, non-occlusive 09/13/2016  . Primary osteoarthritis involving multiple joints 03/14/2015  . Personal history of other malignant neoplasm of skin 12/01/2014  . Crohn's disease ([place] 10/13/2013  . Major depressive disorder, recurrent, in full remission ([place] 05/01/2011  . TRANSAMINASES, SERUM, ELEVATED 05/01/2010  . PSORIASIS 08/02/2009  . Hyperlipidemia 05/11/2009  . GOUT 02/05/2009  . Uncontrolled type 2 diabetes mellitus with stage 3 chronic kidney disease, with long-term current use of insulin ([place]   . Essential hypertension 04/18/2007  . LBBB (left bundle branch block) 04/18/2007  . ALLERGIC RHINITIS 04/18/2007    Past Surgical History:  Procedure Laterality Date  . BREAST BIOPSY Right 06/24/2019   stereo bx/ x clip/ path pending  . BREAST CYST ASPIRATION    . COLONOSCOPY    . DILATION AND CURETTAGE OF UTERUS    . LEFT HEART CATH AND  CORONARY ANGIOGRAPHY N/A 04/24/2019   Procedure: LEFT HEART CATH AND CORONARY ANGIOGRAPHY;  Surgeon: Nelva Bush, MD;  Location: [facility];  Service: Cardiovascular;  Laterality: N/A;  . RIGHT/LEFT HEART CATH AND CORONARY ANGIOGRAPHY N/A 08/26/2018   Procedure: RIGHT/LEFT HEART CATH AND CORONARY ANGIOGRAPHY;  Surgeon: Wellington Hampshire, MD;  Location: [facility];  Service: Cardiovascular;  Laterality: N/A;  . SHOULDER SURGERY     15 + yrs ago   . SKIN SURGERY     nose   . VAGINAL HYSTERECTOMY      Prior to  Admission medications   Medication Sig Start Date End Date Taking? Authorizing Provider  acetaminophen (TYLENOL) 325 MG tablet Take 325-650 mg by mouth daily as needed for moderate pain or headache.    Yes [provider]  aspirin EC 81 MG EC tablet Take 1 tablet (81 mg total) by mouth daily. 04/29/19  Yes Sudini, Alveta Heimlich, MD  carvedilol (COREG) 12.5 MG tablet Take 1 tablet (12.5 mg total) by mouth 2 (two) times daily. 06/11/19 09/09/19 Yes Minna Merritts, MD  clopidogrel (PLAVIX) 75 MG tablet Take 1 tablet (75 mg total) by mouth daily with breakfast. 06/11/19  Yes Gollan, Kathlene November, MD  Ensure (ENSURE) Take 237 mLs by mouth daily.   Yes [provider]  ENTRESTO 49-51 MG TAKE ONE TABLET TWICE DAILY STARTING SUNDAY 09/15/18 Patient taking differently: Take 1 tablet by mouth 2 (two) times daily.  05/05/19  Yes Minna Merritts, MD  FLUoxetine (PROZAC) 20 MG capsule Take 1 capsule (20 mg total) by mouth daily. 02/12/19  Yes Copland, [name], MD  glucose blood (ONETOUCH ULTRA) test strip Use to check blood sugar up to 8 times a day. 06/25/19  Yes Copland, [name], MD  insulin regular human CONCENTRATED (HUMULIN R U-500 KWIKPEN) 500 UNIT/ML kwikpen Inject 50-100 Units into the skin 4 (four) times daily -  before meals and at bedtime. Give 0 units insulin for blood sugar < 200  Give 20 units insulin for blood sugar 201 - 300 Give 50 units insulin for blood sugar 301 - 400 Give 90 units insulin for blood sugar 401 - 500 Give 100 units insulin for blood sugar > 500 . 07/14/19 11/11/19 Yes Gladstone Lighter, MD  Insulin Syringe-Needle U-100 (B-D INS SYRINGE 0.5CC/31GX5/16) 31G X 5/16" 0.5 ML MISC USE AS DIRECTED THREE TIMES A DAY 08/06/17  Yes Copland, [name], MD  ivabradine (CORLANOR) 5 MG TABS tablet TAKE ONE TABLET TWICE DAILY WITH MEALS 08/11/19  Yes Theora Gianotti, NP  spironolactone (ALDACTONE) 25 MG tablet Take 1 tablet (25 mg total) by mouth daily. 08/04/19  Yes Gollan, Kathlene November, MD  pantoprazole (PROTONIX) 40 MG tablet Take 1 tablet (40 mg total) by mouth daily. Patient not taking: Reported on 08/18/2019 08/16/19   Loletha Grayer, MD    Allergies Fish oil, Glimepiride, Guanfacine hcl, and Rosiglitazone  Family History  Problem Relation Age of Onset  . Kidney failure Mother   . Hypertension Father   . Kidney failure Brother   . Colon cancer Neg Hx   . Colon polyps Neg Hx   . Rectal cancer Neg Hx   . Stomach cancer Neg Hx   . Breast cancer Neg Hx     Social History Social History   Tobacco Use  . Smoking status: Former Smoker    Packs/day: 0.75    Years: 30.00    Pack years: 22.50    Types: Cigarettes  Quit date: 06/06/1997    Years since quitting: 22.2  . Smokeless tobacco: Never Used  Substance Use Topics  . Alcohol use: No  . Drug use: No    Review of Systems Level 5 caveat: Unable to obtain review of systems due to altered mental status   ____________________________________________   PHYSICAL EXAM:  VITAL SIGNS: ED Triage Vitals  Enc Vitals Group     BP 08/21/19 1730 131/80     Pulse Rate 08/21/19 1715 (!) 135     Resp 08/21/19 1715 15     Temp 08/21/19 1705 (!) 97.4 F (36.3 C)     Temp Source 08/21/19 1705 Axillary     SpO2 08/21/19 1715 98 %     Weight 08/21/19 1709 150 lb (68 kg)     Height 08/21/19 1709 5' 4"  (1.626 m)     Head Circumference --      Peak Flow --      Pain Score --      Pain Loc --      Pain Edu? --      Excl. in [place]? --     Constitutional: Alert, very confused and weak appearing. Eyes: Conjunctivae are normal.  EOMI.  PERRLA. Head: Atraumatic. Nose: No congestion/rhinnorhea. Mouth/Throat: Mucous membranes are dry.   Neck: Normal range of motion.  Cardiovascular: Tachycardic, regular rhythm. Grossly normal heart sounds.  Good peripheral circulation. Respiratory: Normal respiratory effort.  No retractions. Lungs CTAB. Gastrointestinal: Soft and nontender. No distention.  Genitourinary: No  flank tenderness. Musculoskeletal: No lower extremity edema.  Extremities warm and well perfused.  Neurologic: Motor intact in all extremities. Skin:  Skin is warm and dry. No rash noted. Psychiatric: Unable to assess.  ____________________________________________   LABS (all labs ordered are listed, but only abnormal results are displayed)  Labs Reviewed  GLUCOSE, CAPILLARY - Abnormal; Notable for the following components:      Result Value   Glucose-Capillary >600 (*)    All other components within normal limits  COMPREHENSIVE METABOLIC PANEL - Abnormal; Notable for the following components:   Chloride 92 (*)    CO2 21 (*)    Glucose, Bld 1,434 (*)    BUN 46 (*)    Creatinine, Ser 2.17 (*)    Calcium 10.7 (*)    Total Protein 8.3 (*)    Alkaline Phosphatase 245 (*)    Total Bilirubin 1.3 (*)    GFR calc non Af Amer 23 (*)    GFR calc Af Amer 26 (*)    Anion gap 26 (*)    All other components within normal limits  CBC WITH DIFFERENTIAL/PLATELET - Abnormal; Notable for the following components:   WBC 18.7 (*)    RBC 5.27 (*)    Hemoglobin 15.2 (*)    HCT 53.0 (*)    MCV 100.6 (*)    MCHC 28.7 (*)    Neutro Abs 14.8 (*)    Monocytes Absolute 1.4 (*)    Abs Immature Granulocytes 0.10 (*)    All other components within normal limits  LACTIC ACID, PLASMA - Abnormal; Notable for the following components:   Lactic Acid, Venous 4.1 (*)    All other components within normal limits  LACTIC ACID, PLASMA - Abnormal; Notable for the following components:   Lactic Acid, Venous 7.6 (*)    All other components within normal limits  URINALYSIS, COMPLETE (UACMP) WITH MICROSCOPIC - Abnormal; Notable for the following components:   Color, Urine YELLOW (*)  APPearance CLOUDY (*)    Glucose, UA >=500 (*)    Hgb urine dipstick SMALL (*)    Ketones, ur 5 (*)    All other components within normal limits  BLOOD GAS, VENOUS - Abnormal; Notable for the following components:   Acid-base  deficit 3.7 (*)    All other components within normal limits  GLUCOSE, CAPILLARY - Abnormal; Notable for the following components:   Glucose-Capillary >600 (*)    All other components within normal limits  GLUCOSE, CAPILLARY - Abnormal; Notable for the following components:   Glucose-Capillary >600 (*)    All other components within normal limits  GLUCOSE, CAPILLARY - Abnormal; Notable for the following components:   Glucose-Capillary >600 (*)    All other components within normal limits  GLUCOSE, CAPILLARY - Abnormal; Notable for the following components:   Glucose-Capillary >600 (*)    All other components within normal limits  TROPONIN I (HIGH SENSITIVITY) - Abnormal; Notable for the following components:   Troponin I (High Sensitivity) 18 (*)    All other components within normal limits  TROPONIN I (HIGH SENSITIVITY) - Abnormal; Notable for the following components:   Troponin I (High Sensitivity) 22 (*)    All other components within normal limits  SARS CORONAVIRUS 2 BY RT PCR (HOSPITAL ORDER, [place] [facility])  CULTURE, BLOOD (ROUTINE X 2)  CULTURE, BLOOD (ROUTINE X 2)   ____________________________________________  EKG  ED ECG REPORT I, Arta Silence, the attending physician, personally viewed and interpreted this ECG.  Date: 08/21/2019 EKG Time: 1702 Rate: 135 Rhythm: Sinus tachycardia QRS Axis: normal Intervals: Prolonged QTc, LBBB ST/T Wave abnormalities: normal Narrative Interpretation: LBBB with no evidence of acute ischemia  ____________________________________________  RADIOLOGY    ____________________________________________   PROCEDURES  Procedure(s) performed: No  Procedures  Critical Care performed: Yes  CRITICAL CARE Performed by: Arta Silence   Total critical care time: 60 minutes  Critical care time was exclusive of separately billable procedures and treating other patients.  Critical care was  necessary to treat or prevent imminent or life-threatening deterioration.  Critical care was time spent personally by me on the following activities: development of treatment plan with patient and/or surrogate as well as nursing, discussions with consultants, evaluation of patient's response to treatment, examination of patient, obtaining history from patient or surrogate, ordering and performing treatments and interventions, ordering and review of laboratory studies, ordering and review of radiographic studies, pulse oximetry and re-evaluation of patient's condition. ____________________________________________   INITIAL IMPRESSION / ASSESSMENT AND PLAN / ED COURSE  Pertinent labs & imaging results that were available during my care of the patient were reviewed by me and considered in my medical decision making (see chart for details).  68 year old female with history of diabetes and other PMH as noted above presents with altered mental status, generalized weakness, and elevated blood glucose.  She had vomiting on EMS arrival.  The patient is very confused and is unable to give any history.  I reviewed the past medical records in [place].  The patient was recently admitted to the hospital with AKI and DKA, discharged 6 days ago.  On exam, the patient is weak and frail appearing.  She is tachycardic but afebrile and with otherwise normal vital signs.  She is alert but confused and unable to give any history or voice any specific complaints.  Mucous membranes are dry.  The remainder of the exam is as described above.  Overall presentation is most consistent with  recurrent DKA.  Differential includes dehydration, other metabolic abnormality, or infection/sepsis.  We will give IV fluids, obtain lab work-up, and reassess.  ----------------------------------------- 11:38 PM on 08/21/2019 -----------------------------------------  Work-up is consistent with HHS, with severely elevated blood glucose but  minimal ketones on the UA and no acidosis.  The patient's lactic acid and WBC count are also significantly elevated so I ordered broad-spectrum antibiotics per the sepsis protocol.  The patient is on her third liter of IV fluids.  Chest x-ray and UA are negative.  The patient's COVID-19 swab is also negative.  The patient is on insulin infusion.  Her tachycardia is improving.  She remains awake.  I have discussed the case with hospitalist for admission.  ____________________________  [name] was evaluated in Emergency Department on 08/21/2019 for the symptoms described in the history of present illness. She was evaluated in the context of the global COVID-19 pandemic, which necessitated consideration that the patient might be at risk for infection with the SARS-CoV-2 virus that causes COVID-19. Institutional protocols and algorithms that pertain to the evaluation of patients at risk for COVID-19 are in a state of rapid change based on information released by regulatory bodies including the CDC and federal and state organizations. These policies and algorithms were followed during the patient's care in the ED. ____________________________________________   FINAL CLINICAL IMPRESSION(S) / ED DIAGNOSES  Final diagnoses:  Hyperosmolar hyperglycemic state (HHS) ([place])      NEW MEDICATIONS STARTED DURING THIS VISIT:  New Prescriptions   No medications on file     Note:  This document was prepared using Dragon voice recognition software and may include unintentional dictation errors.    Arta Silence, MD 08/21/19 [phone]

## 2019-08-21 NOTE — Social Work (Addendum)
Patient's APS worker, Milus Glazier [phone]) contacted CSW to shared information regarding patient. Magda Paganini shared that patient is unable to stay at home alone due to not taking her medications correctly. Magda Paganini is currently with patient's family and they want her to be placed in a facility. Magda Paganini shared that patient plans on paying privately for ALF. Magda Paganini will begin to look for placement options in the morning.  Unsure at this time if patient is appropriate for SNF.   4:55pm - Magda Paganini shared that she spoke with Jonelle Sidle at [facility] and was asked to have information regarding patient faxed to them. Medical work up not complete at this time. CSW will pass information to [facility] covering tomorrow.   [place], [place] ED  [phone]

## 2019-08-21 NOTE — ED Notes (Signed)
Called lab to get assistance collecting blood cultures. Levada Dy in lab states phlebotomy will be at bedside as soon as possible.

## 2019-08-21 NOTE — ED Notes (Signed)
Pt to remain at rate of 8.5 on insulin gtt per [facility].

## 2019-08-22 ENCOUNTER — Ambulatory Visit: Payer: Self-pay

## 2019-08-22 ENCOUNTER — Inpatient Hospital Stay: Payer: Medicare HMO

## 2019-08-22 DIAGNOSIS — R778 Other specified abnormalities of plasma proteins: Secondary | ICD-10-CM

## 2019-08-22 DIAGNOSIS — N179 Acute kidney failure, unspecified: Secondary | ICD-10-CM | POA: Diagnosis present

## 2019-08-22 DIAGNOSIS — D696 Thrombocytopenia, unspecified: Secondary | ICD-10-CM | POA: Diagnosis not present

## 2019-08-22 DIAGNOSIS — I2510 Atherosclerotic heart disease of native coronary artery without angina pectoris: Secondary | ICD-10-CM | POA: Diagnosis not present

## 2019-08-22 DIAGNOSIS — I447 Left bundle-branch block, unspecified: Secondary | ICD-10-CM | POA: Diagnosis not present

## 2019-08-22 DIAGNOSIS — Z20828 Contact with and (suspected) exposure to other viral communicable diseases: Secondary | ICD-10-CM | POA: Diagnosis not present

## 2019-08-22 DIAGNOSIS — R279 Unspecified lack of coordination: Secondary | ICD-10-CM | POA: Diagnosis not present

## 2019-08-22 DIAGNOSIS — Z9114 Patient's other noncompliance with medication regimen: Secondary | ICD-10-CM | POA: Diagnosis not present

## 2019-08-22 DIAGNOSIS — E870 Hyperosmolality and hypernatremia: Secondary | ICD-10-CM

## 2019-08-22 DIAGNOSIS — Z7982 Long term (current) use of aspirin: Secondary | ICD-10-CM | POA: Diagnosis not present

## 2019-08-22 DIAGNOSIS — N1831 Chronic kidney disease, stage 3a: Secondary | ICD-10-CM

## 2019-08-22 DIAGNOSIS — K219 Gastro-esophageal reflux disease without esophagitis: Secondary | ICD-10-CM | POA: Diagnosis present

## 2019-08-22 DIAGNOSIS — Z794 Long term (current) use of insulin: Secondary | ICD-10-CM | POA: Diagnosis not present

## 2019-08-22 DIAGNOSIS — N189 Chronic kidney disease, unspecified: Secondary | ICD-10-CM

## 2019-08-22 DIAGNOSIS — G9341 Metabolic encephalopathy: Secondary | ICD-10-CM | POA: Diagnosis present

## 2019-08-22 DIAGNOSIS — F4325 Adjustment disorder with mixed disturbance of emotions and conduct: Secondary | ICD-10-CM | POA: Diagnosis not present

## 2019-08-22 DIAGNOSIS — E1100 Type 2 diabetes mellitus with hyperosmolarity without nonketotic hyperglycemic-hyperosmolar coma (NKHHC): Secondary | ICD-10-CM | POA: Diagnosis not present

## 2019-08-22 DIAGNOSIS — A419 Sepsis, unspecified organism: Secondary | ICD-10-CM | POA: Diagnosis not present

## 2019-08-22 DIAGNOSIS — I428 Other cardiomyopathies: Secondary | ICD-10-CM | POA: Diagnosis not present

## 2019-08-22 DIAGNOSIS — D649 Anemia, unspecified: Secondary | ICD-10-CM | POA: Diagnosis not present

## 2019-08-22 DIAGNOSIS — Z87891 Personal history of nicotine dependence: Secondary | ICD-10-CM | POA: Diagnosis not present

## 2019-08-22 DIAGNOSIS — R4182 Altered mental status, unspecified: Secondary | ICD-10-CM | POA: Diagnosis not present

## 2019-08-22 DIAGNOSIS — K922 Gastrointestinal hemorrhage, unspecified: Secondary | ICD-10-CM

## 2019-08-22 DIAGNOSIS — E1122 Type 2 diabetes mellitus with diabetic chronic kidney disease: Secondary | ICD-10-CM | POA: Diagnosis not present

## 2019-08-22 DIAGNOSIS — M159 Polyosteoarthritis, unspecified: Secondary | ICD-10-CM | POA: Diagnosis present

## 2019-08-22 DIAGNOSIS — I5022 Chronic systolic (congestive) heart failure: Secondary | ICD-10-CM

## 2019-08-22 DIAGNOSIS — K5090 Crohn's disease, unspecified, without complications: Secondary | ICD-10-CM | POA: Diagnosis not present

## 2019-08-22 DIAGNOSIS — N183 Chronic kidney disease, stage 3 unspecified: Secondary | ICD-10-CM | POA: Diagnosis present

## 2019-08-22 DIAGNOSIS — Z7902 Long term (current) use of antithrombotics/antiplatelets: Secondary | ICD-10-CM | POA: Diagnosis not present

## 2019-08-22 DIAGNOSIS — Z8249 Family history of ischemic heart disease and other diseases of the circulatory system: Secondary | ICD-10-CM | POA: Diagnosis not present

## 2019-08-22 DIAGNOSIS — I248 Other forms of acute ischemic heart disease: Secondary | ICD-10-CM | POA: Diagnosis not present

## 2019-08-22 DIAGNOSIS — I10 Essential (primary) hypertension: Secondary | ICD-10-CM | POA: Diagnosis not present

## 2019-08-22 DIAGNOSIS — F3342 Major depressive disorder, recurrent, in full remission: Secondary | ICD-10-CM | POA: Diagnosis not present

## 2019-08-22 DIAGNOSIS — E1165 Type 2 diabetes mellitus with hyperglycemia: Secondary | ICD-10-CM | POA: Diagnosis not present

## 2019-08-22 DIAGNOSIS — Z743 Need for continuous supervision: Secondary | ICD-10-CM | POA: Diagnosis not present

## 2019-08-22 DIAGNOSIS — I130 Hypertensive heart and chronic kidney disease with heart failure and stage 1 through stage 4 chronic kidney disease, or unspecified chronic kidney disease: Secondary | ICD-10-CM | POA: Diagnosis not present

## 2019-08-22 DIAGNOSIS — R45851 Suicidal ideations: Secondary | ICD-10-CM | POA: Diagnosis not present

## 2019-08-22 DIAGNOSIS — Z841 Family history of disorders of kidney and ureter: Secondary | ICD-10-CM | POA: Diagnosis not present

## 2019-08-22 DIAGNOSIS — R69 Illness, unspecified: Secondary | ICD-10-CM | POA: Diagnosis not present

## 2019-08-22 LAB — BASIC METABOLIC PANEL
Anion gap: 14 (ref 5–15)
Anion gap: 12 (ref 5–15)
Anion gap: 12 (ref 5–15)
Anion gap: 11 (ref 5–15)
Anion gap: 9 (ref 5–15)
BUN: 41 mg/dL — ABNORMAL HIGH (ref 8–23)
BUN: 39 mg/dL — ABNORMAL HIGH (ref 8–23)
BUN: 39 mg/dL — ABNORMAL HIGH (ref 8–23)
BUN: 41 mg/dL — ABNORMAL HIGH (ref 8–23)
BUN: 40 mg/dL — ABNORMAL HIGH (ref 8–23)
CO2: 25 mmol/L (ref 22–32)
CO2: 27 mmol/L (ref 22–32)
CO2: 28 mmol/L (ref 22–32)
CO2: 29 mmol/L (ref 22–32)
CO2: 28 mmol/L (ref 22–32)
Calcium: 9.8 mg/dL (ref 8.9–10.3)
Calcium: 10.2 mg/dL (ref 8.9–10.3)
Calcium: 9.9 mg/dL (ref 8.9–10.3)
Calcium: 10.5 mg/dL — ABNORMAL HIGH (ref 8.9–10.3)
Calcium: 9.5 mg/dL (ref 8.9–10.3)
Chloride: 116 mmol/L — ABNORMAL HIGH (ref 98–111)
Chloride: 121 mmol/L — ABNORMAL HIGH (ref 98–111)
Chloride: 121 mmol/L — ABNORMAL HIGH (ref 98–111)
Chloride: 122 mmol/L — ABNORMAL HIGH (ref 98–111)
Chloride: 122 mmol/L — ABNORMAL HIGH (ref 98–111)
Creatinine, Ser: 1.61 mg/dL — ABNORMAL HIGH (ref 0.44–1.00)
Creatinine, Ser: 1.33 mg/dL — ABNORMAL HIGH (ref 0.44–1.00)
Creatinine, Ser: 1.32 mg/dL — ABNORMAL HIGH (ref 0.44–1.00)
Creatinine, Ser: 1.32 mg/dL — ABNORMAL HIGH (ref 0.44–1.00)
Creatinine, Ser: 1.3 mg/dL — ABNORMAL HIGH (ref 0.44–1.00)
GFR calc Af Amer: 38 mL/min — ABNORMAL LOW (ref >60)
GFR calc Af Amer: 47 mL/min — ABNORMAL LOW (ref >60)
GFR calc Af Amer: 48 mL/min — ABNORMAL LOW (ref >60)
GFR calc Af Amer: 48 mL/min — ABNORMAL LOW (ref >60)
GFR calc Af Amer: 49 mL/min — ABNORMAL LOW (ref >60)
GFR calc non Af Amer: 33 mL/min — ABNORMAL LOW (ref >60)
GFR calc non Af Amer: 41 mL/min — ABNORMAL LOW (ref >60)
GFR calc non Af Amer: 41 mL/min — ABNORMAL LOW (ref >60)
GFR calc non Af Amer: 41 mL/min — ABNORMAL LOW (ref >60)
GFR calc non Af Amer: 42 mL/min — ABNORMAL LOW (ref >60)
Glucose, Bld: 734 mg/dL (ref 70–99)
Glucose, Bld: 487 mg/dL — ABNORMAL HIGH (ref 70–99)
Glucose, Bld: 272 mg/dL — ABNORMAL HIGH (ref 70–99)
Glucose, Bld: 181 mg/dL — ABNORMAL HIGH (ref 70–99)
Glucose, Bld: 198 mg/dL — ABNORMAL HIGH (ref 70–99)
Potassium: 3.4 mmol/L — ABNORMAL LOW (ref 3.5–5.1)
Potassium: 3.2 mmol/L — ABNORMAL LOW (ref 3.5–5.1)
Potassium: 3.2 mmol/L — ABNORMAL LOW (ref 3.5–5.1)
Potassium: 4.4 mmol/L (ref 3.5–5.1)
Potassium: 3.6 mmol/L (ref 3.5–5.1)
Sodium: 155 mmol/L — ABNORMAL HIGH (ref 135–145)
Sodium: 160 mmol/L — ABNORMAL HIGH (ref 135–145)
Sodium: 161 mmol/L (ref 135–145)
Sodium: 162 mmol/L (ref 135–145)
Sodium: 159 mmol/L — ABNORMAL HIGH (ref 135–145)

## 2019-08-22 LAB — GLUCOSE, CAPILLARY
Glucose-Capillary: 131 mg/dL — ABNORMAL HIGH (ref 70–99)
Glucose-Capillary: 162 mg/dL — ABNORMAL HIGH (ref 70–99)
Glucose-Capillary: 166 mg/dL — ABNORMAL HIGH (ref 70–99)
Glucose-Capillary: 170 mg/dL — ABNORMAL HIGH (ref 70–99)
Glucose-Capillary: 177 mg/dL — ABNORMAL HIGH (ref 70–99)
Glucose-Capillary: 192 mg/dL — ABNORMAL HIGH (ref 70–99)
Glucose-Capillary: 205 mg/dL — ABNORMAL HIGH (ref 70–99)
Glucose-Capillary: 208 mg/dL — ABNORMAL HIGH (ref 70–99)
Glucose-Capillary: 210 mg/dL — ABNORMAL HIGH (ref 70–99)
Glucose-Capillary: 213 mg/dL — ABNORMAL HIGH (ref 70–99)
Glucose-Capillary: 230 mg/dL — ABNORMAL HIGH (ref 70–99)
Glucose-Capillary: 238 mg/dL — ABNORMAL HIGH (ref 70–99)
Glucose-Capillary: 276 mg/dL — ABNORMAL HIGH (ref 70–99)
Glucose-Capillary: 345 mg/dL — ABNORMAL HIGH (ref 70–99)
Glucose-Capillary: 397 mg/dL — ABNORMAL HIGH (ref 70–99)
Glucose-Capillary: 443 mg/dL — ABNORMAL HIGH (ref 70–99)
Glucose-Capillary: 447 mg/dL — ABNORMAL HIGH (ref 70–99)
Glucose-Capillary: 474 mg/dL — ABNORMAL HIGH (ref 70–99)
Glucose-Capillary: 572 mg/dL (ref 70–99)
Glucose-Capillary: >600 mg/dL (ref 70–99)
Glucose-Capillary: >600 mg/dL (ref 70–99)
Glucose-Capillary: >600 mg/dL (ref 70–99)

## 2019-08-22 LAB — COMPREHENSIVE METABOLIC PANEL
ALT: 26 U/L (ref 0–44)
AST: 19 U/L (ref 15–41)
Albumin: 3.5 g/dL (ref 3.5–5.0)
Alkaline Phosphatase: 245 U/L — ABNORMAL HIGH (ref 38–126)
Anion gap: 26 — ABNORMAL HIGH (ref 5–15)
BUN: 46 mg/dL — ABNORMAL HIGH (ref 8–23)
CO2: 21 mmol/L — ABNORMAL LOW (ref 22–32)
Calcium: 10.7 mg/dL — ABNORMAL HIGH (ref 8.9–10.3)
Chloride: 92 mmol/L — ABNORMAL LOW (ref 98–111)
Creatinine, Ser: 2.17 mg/dL — ABNORMAL HIGH (ref 0.44–1.00)
GFR calc Af Amer: 26 mL/min — ABNORMAL LOW (ref >60)
GFR calc non Af Amer: 23 mL/min — ABNORMAL LOW (ref >60)
Glucose, Bld: 1434 mg/dL (ref 70–99)
Potassium: 4.7 mmol/L (ref 3.5–5.1)
Sodium: 139 mmol/L (ref 135–145)
Total Bilirubin: 1.3 mg/dL — ABNORMAL HIGH (ref 0.3–1.2)
Total Protein: 8.3 g/dL — ABNORMAL HIGH (ref 6.5–8.1)

## 2019-08-22 LAB — LACTIC ACID, PLASMA
Lactic Acid, Venous: 3.6 mmol/L (ref 0.5–1.9)
Lactic Acid, Venous: 2.6 mmol/L (ref 0.5–1.9)
Lactic Acid, Venous: 3.2 mmol/L (ref 0.5–1.9)

## 2019-08-22 LAB — BRAIN NATRIURETIC PEPTIDE: B Natriuretic Peptide: 202 pg/mL — ABNORMAL HIGH (ref 0.0–100.0)

## 2019-08-22 MED ORDER — POTASSIUM CHLORIDE 10 MEQ/100ML IV SOLN
10.0000 meq | INTRAVENOUS | Status: DC
  Administered 2019-08-22 (×2): 10 meq via INTRAVENOUS
  Filled 2019-08-22 (×4): qty 100

## 2019-08-22 MED ORDER — INSULIN GLARGINE 100 UNIT/ML ~~LOC~~ SOLN
15.0000 [IU] | Freq: Two times a day (BID) | SUBCUTANEOUS | Status: DC
  Administered 2019-08-22 (×2): 15 [IU] via SUBCUTANEOUS
  Filled 2019-08-22 (×6): qty 0.15

## 2019-08-22 MED ORDER — POTASSIUM CHLORIDE 10 MEQ/100ML IV SOLN
10.0000 meq | INTRAVENOUS | Status: AC
  Administered 2019-08-22 (×2): 10 meq via INTRAVENOUS
  Filled 2019-08-22: qty 100

## 2019-08-22 MED ORDER — SPIRONOLACTONE 25 MG PO TABS: 25.0000 mg | ORAL_TABLET | Freq: Every day | ORAL | Status: DC

## 2019-08-22 MED ORDER — POTASSIUM CHLORIDE CRYS ER 20 MEQ PO TBCR: 40.0000 meq | EXTENDED_RELEASE_TABLET | Freq: Once | ORAL | Status: DC

## 2019-08-22 MED ORDER — SACUBITRIL-VALSARTAN 49-51 MG PO TABS: 1.0000 | ORAL_TABLET | Freq: Two times a day (BID) | ORAL | Status: DC

## 2019-08-22 MED ORDER — CHLORHEXIDINE GLUCONATE CLOTH 2 % EX PADS
6.0000 | MEDICATED_PAD | Freq: Every day | CUTANEOUS | Status: DC
  Administered 2019-08-23 – 2019-09-03 (×10): 6 via TOPICAL

## 2019-08-22 MED ORDER — VANCOMYCIN VARIABLE DOSE PER UNSTABLE RENAL FUNCTION (PHARMACIST DOSING): Status: DC

## 2019-08-22 MED ORDER — ENOXAPARIN SODIUM 30 MG/0.3ML ~~LOC~~ SOLN
30.0000 mg | SUBCUTANEOUS | Status: DC
  Filled 2019-08-22: qty 0.3

## 2019-08-22 MED ORDER — PANTOPRAZOLE SODIUM 40 MG IV SOLR
40.0000 mg | Freq: Two times a day (BID) | INTRAVENOUS | Status: DC
  Administered 2019-08-22 – 2019-08-23 (×5): 40 mg via INTRAVENOUS
  Filled 2019-08-22 (×5): qty 40

## 2019-08-22 MED ORDER — SODIUM CHLORIDE 0.9 % IV SOLN
2.0000 g | Freq: Two times a day (BID) | INTRAVENOUS | Status: DC
  Administered 2019-08-22 – 2019-08-24 (×6): 2 g via INTRAVENOUS
  Filled 2019-08-22 (×9): qty 2

## 2019-08-22 MED ORDER — INSULIN ASPART 100 UNIT/ML ~~LOC~~ SOLN
0.0000 [IU] | SUBCUTANEOUS | Status: DC
  Administered 2019-08-22: 5 [IU] via SUBCUTANEOUS
  Administered 2019-08-22: 3 [IU] via SUBCUTANEOUS
  Administered 2019-08-22 – 2019-08-23 (×2): 8 [IU] via SUBCUTANEOUS
  Administered 2019-08-23: 5 [IU] via SUBCUTANEOUS
  Administered 2019-08-23: 11 [IU] via SUBCUTANEOUS
  Administered 2019-08-23: 8 [IU] via SUBCUTANEOUS
  Administered 2019-08-23: 08:00:00 3 [IU] via SUBCUTANEOUS
  Administered 2019-08-24: 2 [IU] via SUBCUTANEOUS
  Administered 2019-08-24: 11 [IU] via SUBCUTANEOUS
  Administered 2019-08-24: 5 [IU] via SUBCUTANEOUS
  Filled 2019-08-22 (×11): qty 1

## 2019-08-22 MED ORDER — POTASSIUM CHLORIDE 10 MEQ/100ML IV SOLN
10.0000 meq | INTRAVENOUS | Status: AC
  Filled 2019-08-22 (×2): qty 100

## 2019-08-22 MED ORDER — DEXTROSE-NACL 5-0.45 % IV SOLN
INTRAVENOUS | Status: DC
  Administered 2019-08-22: 07:00:00 via INTRAVENOUS

## 2019-08-22 MED ORDER — VANCOMYCIN HCL IN DEXTROSE 750-5 MG/150ML-% IV SOLN
750.0000 mg | INTRAVENOUS | Status: DC
  Administered 2019-08-22 – 2019-08-23 (×2): 750 mg via INTRAVENOUS
  Filled 2019-08-22 (×4): qty 150

## 2019-08-22 MED ORDER — FLUOXETINE HCL 20 MG PO CAPS: 20.0000 mg | ORAL_CAPSULE | Freq: Every day | ORAL | Status: DC

## 2019-08-22 MED ORDER — DEXTROSE 5 % IV SOLN
INTRAVENOUS | Status: DC
  Administered 2019-08-22: 10:00:00 via INTRAVENOUS

## 2019-08-22 MED ORDER — INSULIN REGULAR(HUMAN) IN NACL 100-0.9 UT/100ML-% IV SOLN
INTRAVENOUS | Status: DC
  Administered 2019-08-22: 8.5 [IU]/h via INTRAVENOUS
  Administered 2019-08-22: 3 [IU]/h via INTRAVENOUS
  Filled 2019-08-22: qty 100

## 2019-08-22 MED ORDER — SODIUM CHLORIDE 0.45 % IV SOLN
INTRAVENOUS | Status: DC
  Administered 2019-08-22: 17:00:00 via INTRAVENOUS

## 2019-08-22 MED ORDER — CARVEDILOL 12.5 MG PO TABS
12.5000 mg | ORAL_TABLET | Freq: Two times a day (BID) | ORAL | Status: DC
  Administered 2019-08-22 – 2019-09-23 (×58): 12.5 mg via ORAL
  Filled 2019-08-22 (×62): qty 1

## 2019-08-22 MED ORDER — SODIUM CHLORIDE 0.9 % IV SOLN
INTRAVENOUS | Status: DC
  Administered 2019-08-22: 03:00:00 via INTRAVENOUS

## 2019-08-22 MED ORDER — DEXTROSE 50 % IV SOLN
0.0000 mL | INTRAVENOUS | Status: DC | PRN
  Administered 2019-09-02: 25 mL via INTRAVENOUS
  Filled 2019-08-22: qty 50

## 2019-08-22 MED ORDER — LORAZEPAM 2 MG/ML IJ SOLN
0.5000 mg | Freq: Once | INTRAMUSCULAR | Status: AC
  Administered 2019-08-22: 0.5 mg via INTRAVENOUS
  Filled 2019-08-22: qty 1

## 2019-08-22 NOTE — Progress Notes (Addendum)
Inpatient Diabetes Program Recommendations  AACE/ADA: New Consensus Statement on Inpatient Glycemic Control   Target Ranges:  Prepandial:   less than 140 mg/dL      Peak postprandial:   less than 180 mg/dL (1-2 hours)      Critically ill patients:  140 - 180 mg/dL  Results for Kristy [name], Kristy [name] (MRN [id number]) as of 08/22/2019 08:06  Ref. Range 08/22/2019 00:55 08/22/2019 02:00 08/22/2019 02:45 08/22/2019 03:27 08/22/2019 04:04 08/22/2019 04:43 08/22/2019 05:24 08/22/2019 05:54 08/22/2019 06:51 08/22/2019 07:56  Glucose-Capillary Latest Ref Range: 70 - 99 mg/dL >600 (HH) >600 (HH) 572 (HH) 447 (H) 474 (H) 443 (H) 397 (H) 345 (H) 210 (H) 192 (H)   Results for [name], [name] (MRN [id number]) as of 08/22/2019 08:06  Ref. Range 08/21/2019 17:01  Glucose Latest Ref Range: 70 - 99 mg/dL 1,434 [facility])  Results for [name], [name] (MRN [id number]) as of 08/22/2019 08:06  Ref. Range 12/02/2018 00:00 04/20/2019 00:27 07/08/2019 15:07  Hemoglobin A1C Latest Ref Range: 4.8 - 5.6 % >14.0 >15.5 (H) 10.9 (H)   Review of Glycemic Control  Diabetes history:DM2 Outpatient Diabetes medications:Humulin RU500 Insulin QID (with meals and at bedtime) per SSI <200- 0 units 201-300- 20 units 300-400- 50 units 401-500- 90 units >500- 100 units Current orders for Inpatient glycemic control: IV insulin  Inpatient Diabetes Program Recommendations:   At time of transition from IV to SQ insulin: Once MD is ready to transition from IV to SQ insulin, please consider ordering Lantus 17 units BID (based on 68 kg x 0.5 units), CBGs Q4H, Novolog 0-20 units Q4H.    NOTE: Patient has multiple admissions in the past 6 months with DKA and hyperglycemia.  Inpatient Diabetes Coordinator spoke with patient on 08/15/19 and on 07/09/19 (see notes for details) during prior  hospitalizations. Initial glucose 1434 mg/dl on 08/21/19 and patient started on IV insulin.   Addendum 08/22/19@12 :15-Went to speak with patient regarding DM and outpatient DM control. Patient lying on her side on the stretch with eyes open. In trying to have a conversation with patient, she would mumble when asked a question. Patient was not able to provide any information at this time or have a meaningful conversation. Will continue to follow.  Thanks, Barnie Alderman, RN, MSN, CDE Diabetes Coordinator Inpatient Diabetes Program [phone] (Team Pager from 8am to 5pm)

## 2019-08-22 NOTE — ED Notes (Signed)
Pt stripping clothing off, and unable to answer any orientation questions at this time. Gown placed back on pt, pt repositioned, and covered with blankets at this time

## 2019-08-22 NOTE — ED Notes (Signed)
Helped the patient sit up and take a few sips of water, which she tolerated at this time. This nurse concerned in her ability to take PO meds. Expressed concerns to provider.

## 2019-08-22 NOTE — Progress Notes (Signed)
Pharmacy Antibiotic Note  Kristy [name] is a 68 y.o. female admitted on 08/21/2019 with AMS w/ HHS meeting 3/4 SIRS and LA 7.6 >> 4.1 >> 3.6.  Pharmacy has been consulted for vanc/cefepime dosing. Patient received vanc 1.25g IV load and cefepime 2g IV x 1 in ED.  Plan: Patient currently in AKI w/ Scr of 2.17 (baseline Scr 0.7 - 0.9). Will f/u w/ am labs to reassess renal function. Will continue to monitor.  Height: 5' 4"  (162.6 cm) Weight: 150 lb (68 kg) IBW/kg (Calculated) : 54.7  Temp (24hrs), Avg:97.4 F (36.3 C), Min:97.4 F (36.3 C), Max:97.4 F (36.3 C)  Recent Labs  Lab 08/21/19 1701 08/21/19 1814 08/21/19 1930 08/22/19 0116 08/22/19 0207  WBC 18.7*  --   --   --   --   CREATININE 2.17*  --   --  1.61*  --   LATICACIDVEN  --  7.6* 4.1*  --  3.6*    Estimated Creatinine Clearance: 31.7 mL/min (A) (by C-G formula based on SCr of 1.61 mg/dL (H)).    Allergies  Allergen Reactions  . Fish Oil Other (See Comments)    Gout  . Glimepiride Other (See Comments)    REACTION: hypoglycemia  . Guanfacine Hcl Other (See Comments)    REACTION: unspecified  . Rosiglitazone Other (See Comments)    CHF    Thank you for allowing pharmacy to be a part of this patient's care.  Tobie Lords, PharmD, BCPS Clinical Pharmacist 08/22/2019 4:13 AM

## 2019-08-22 NOTE — Progress Notes (Signed)
Pharmacy Antibiotic Note  Kristy [name] is a 68 y.o. female admitted on 08/21/2019 with AMS w/ HHS meeting 3/4 SIRS and LA 7.6 >> 4.1 >> 3.6.  Pharmacy has been consulted for vanc/cefepime dosing. Patient received vanc 1.25g IV load and cefepime 2g IV x 1 in ED.  Plan: 11/20 @ 0500 Scr 1.33 renal function improving but still not at baseline, will start regimen:   Vancomycin 750 mg IV Q 24 hrs. Goal AUC 400-550. Expected AUC: 458.2 SCr used: 1.33 Cssmin: 12.9  Will continue cefepime 2g IV q12h per CrCl 30 - 60 ml/min and will continue to monitor s/sx of infx and renal function.  Height: 5' 4"  (162.6 cm) Weight: 150 lb (68 kg) IBW/kg (Calculated) : 54.7  Temp (24hrs), Avg:97.4 F (36.3 C), Min:97.4 F (36.3 C), Max:97.4 F (36.3 C)  Recent Labs  Lab 08/21/19 1701 08/21/19 1814 08/21/19 1930 08/22/19 0116 08/22/19 0207 08/22/19 0445  WBC 18.7*  --   --   --   --   --   CREATININE 2.17*  --   --  1.61*  --  1.33*  LATICACIDVEN  --  7.6* 4.1*  --  3.6*  --     Estimated Creatinine Clearance: 38.3 mL/min (A) (by C-G formula based on SCr of 1.33 mg/dL (H)).    Allergies  Allergen Reactions  . Fish Oil Other (See Comments)    Gout  . Glimepiride Other (See Comments)    REACTION: hypoglycemia  . Guanfacine Hcl Other (See Comments)    REACTION: unspecified  . Rosiglitazone Other (See Comments)    CHF    Thank you for allowing pharmacy to be a part of this patient's care.  Tobie Lords, PharmD, BCPS Clinical Pharmacist 08/22/2019 7:02 AM

## 2019-08-22 NOTE — ED Notes (Signed)
New depends applied. Bed sheets remain clean. Pt repositioned in bed and cords untangled.

## 2019-08-22 NOTE — ED Notes (Signed)
Pt transported to ct at this time 

## 2019-08-22 NOTE — ED Notes (Signed)
Pt more alert when providing care. Pt cooperative and able to follow commands at this time. Unable to say her name and pt states "I cant remember". When asked if she knows where she is she states "yes, [place]". RN asked if she means [place] and she states "yes".

## 2019-08-22 NOTE — ED Notes (Signed)
Attempted to call report to ICU. icu states they are unable to take report at this time

## 2019-08-22 NOTE — Care Management (Signed)
ED RN CM: Received call from Magda Paganini, [place] worker requesting status of admission. Advised patient was being admitted to hospital. Magda Paganini states she will outreach on Monday to assist with discharge to ALF.

## 2019-08-22 NOTE — ED Notes (Signed)
PT has underwear, a bra, licence, Corlanor prescription bottle and Entresto prescription bottle at bedside in belongings bag.

## 2019-08-22 NOTE — Progress Notes (Signed)
PROGRESS NOTE    [name]  WUJ:[id number] DOB: September 19, 1951 DOA: 08/21/2019 PCP: Owens Loffler, MD    Brief Narrative:  Kristy [name] is a 68 y.o. female with medical history significant of nonischemic cardiomyopathy, LBBB, systolic congestive heart failure, Crohn's disease, chronic kidney disease stage III, type 2 diabetes with hx of DKA, hypertension who presents with altered mental status.  Patient is unable to provide history as she is confused and ill-appearing. Patient reportedly was brought in by EMS from home for altered mental status and increased blood glucose.  Blood glucose with EMS in the 600s and improved down to 331 post 500 cc normal saline with EMS.  She reportedly was actively vomiting at the time of EMS arrival.  She was noted by nursing to be alert and oriented only to self in the ED and was disoriented and pulling on her leads and IV lines. She was recently discharged on 08/13/2019 with DKA and reportedly was non-compliant with her medications.   ED Course: Patient was afebrile and normotensive but tachycardic up to 130s and intermittently tachypneic up to 28 and had to be placed on 2 L via nasal cannula. CBC shows leukocytosis of 18.7, hemoglobin of 15.2 CMP showed glucose of 1434, BUN of 46, creatinine of 2.17, anion gap of 26.Troponin of 18 and 22.  Initial lactate of 7.6 with improvement down to 4.1 after fluid resuscitation. PH of 7.28, Bicarb of 23.  Chest x-ray shows mild bronchitic thick changes. She was given 3L of NS bolus and started on vancomycin and cefepime for broad-spectrum antibiotic coverage with no clear source of infection.  Interim History: -11/20: will get CT-head   Subjective:  Pt has acute encephalopathy, is not oriented x3, cannot provide any information.  Patient does not have active respiratory distress, no active nausea vomiting or diarrhea noted.  She moves all extremities.   Assessment & Plan:   Principal Problem:  Hyperosmolar hyperglycemic state (HHS) ([place]) Active Problems:   Uncontrolled type 2 diabetes mellitus with stage 3 chronic kidney disease, with long-term current use of insulin (HCC)   Essential hypertension   LBBB (left bundle branch block)   Coronary artery disease, non-occlusive   Chronic systolic CHF (congestive heart failure) (HCC)   NICM (nonischemic cardiomyopathy) (HCC)   Elevated troponin   Hypernatremia   Sepsis ([place])   Acute renal failure superimposed on stage 3a chronic kidney disease ([place])   Acute metabolic encephalopathy   Hyperosmolar hyperglycemic state with coma (HHS) ([place]): this is due to medication noncompliance. -continue insulin gtt -BMP q4h -correct electrolytes disturbance, particularly potassium -consult diabetic educator  Addendum: CBG 166 -will start lantus 15 unit bid, 2 hours later, stop insulin gtt and then start SSI-moderate   Acute metabolic encephalopathy: most likely due to HHS as above -Frequent neuro check -keep pt NPO  -hold all oral medications until mental status improves -will get Ct-head   Sepsis: Patient [name] for sepsis with leukocytosis, tachycardia, tachypnea.  Etiology is not clear.  Patient has elevated lactic acid 7.6, 4.1, 0.4.  Chest x-ray showed mild bronchitis change.  Urinalysis negative.  COVID-19 negative. -Continue vancomycin and cefepime. -Follow-up of blood culture -will get Procalcitonin and trend lactic acid levels per sepsis protocol. -IVF: 3L of NS bolus in ED, followed by 75 cc/h of 1/2 NS -->D5 at 75 cc/h   Acute on chronic kidney disease stage IIIa: Baseline creatinine 1.0-1.4.  Her creatinine was 1.61 --> --> 1.32 this AM. BUN 39.  -  follow serial BMP with aggressive fluid resuscitiation  Lab Results  Component Value Date   CREATININE 1.32 (H) 08/22/2019   CREATININE 1.32 (H) 08/22/2019   CREATININE 1.33 (H) 08/22/2019    Suspected coffee-ground emesis: Hgb stable 15.2. Seen by GI in recent admission  last week and they did not recommend procedure and to take Protonix for a month  -Start IV Protonix  Chronic systolic congestive heart failure with EF 35 to 40%. BNP 207. No signs of acute exacerbation -Continue Coreg and Aldactone when able. Hold Entresto for borderline soft BP and [id number]-IIIa  Uncontrolled type 2 diabetes mellitus with stage 3 chronic kidney disease, with long-term current use of insulin ([place]): A1c 10.9 on 07/08/19. Now has HHS. -on Insulin gtt  Essential hypertension: Bp soft -hold Bp meds  Coronary artery disease, non-occlusive and elevated trop: Trop 19 -->22. Likely due to demand ischemia -Hold aspirin and Plavix due to possible coffee-ground emesis.  Hypernatremia: Na 160 this AM.  -On D5 at 75 cc/h   DVT prophylaxis: SCD Code Status: full Family Communication:  None, not at bed side   Consultants:   none  Procedures:  none  Antimicrobials:  Anti-infectives (From admission, onward)   Start     Dose/Rate Route Frequency Ordered Stop   08/22/19 2200  vancomycin (VANCOCIN) IVPB 750 mg/150 ml premix     750 mg 150 mL/hr over 60 Minutes Intravenous Every 24 hours 08/22/19 0701     08/22/19 0900  ceFEPIme (MAXIPIME) 2 g in sodium chloride 0.9 % 100 mL IVPB     2 g 200 mL/hr over 30 Minutes Intravenous Every 12 hours 08/22/19 0701     08/22/19 0140  vancomycin variable dose per unstable renal function (pharmacist dosing)  Status:  Discontinued      Does not apply See admin instructions 08/22/19 0140 08/22/19 0925   08/21/19 2000  vancomycin (VANCOCIN) 1,250 mg in sodium chloride 0.9 % 250 mL IVPB     1,250 mg 166.7 mL/hr over 90 Minutes Intravenous  Once 08/21/19 1943 08/22/19 0156   08/21/19 1945  ceFEPIme (MAXIPIME) 2 g in sodium chloride 0.9 % 100 mL IVPB     2 g 200 mL/hr over 30 Minutes Intravenous  Once 08/21/19 1940 08/21/19 2153   08/21/19 1945  vancomycin (VANCOCIN) IVPB 1000 mg/200 mL premix  Status:  Discontinued     1,000 mg 200 mL/hr  over 60 Minutes Intravenous  Once 08/21/19 1940 08/21/19 1943       Objective: Vitals:   08/22/19 1215 08/22/19 1230 08/22/19 1245 08/22/19 1300  BP: 133/87 115/81 96/72 126/70  Pulse: (!) 124 (!) 126 (!) 124 (!) 127  Resp: (!) 21 (!) 22 (!) 22 20  Temp:      TempSrc:      SpO2: 97% 93% 94% 98%  Weight:      Height:        Intake/Output Summary (Last 24 hours) at 08/22/2019 1323 Last data filed at 08/22/2019 1248 Gross per 24 hour  Intake 4649.58 ml  Output -  Net 4649.58 ml   Filed Weights   08/21/19 1709  Weight: 68 kg    Examination:  Physical Exam: Dry mucous membrane  General: Not in acute distress HEENT: PERRL, EOMI, no scleral icterus, No JVD or bruit Cardiac: S1/S2, RRR, No murmurs, gallops or rubs Pulm:  No rales, wheezing, rhonchi or rubs. Abd: Soft, nondistended, nontender,  no organomegaly, BS present Ext: No edema. 1+DP/PT pulse bilaterally Musculoskeletal: No joint  deformities, erythema, or stiffness, ROM full Skin: No rashes.  Neuro: not alert, barely arousable, not oriented x3, not following command. Moves all extremeties. Psych: Patient is not psychotic, no suicidal or hemocidal ideation.    Data Reviewed: I have personally reviewed following labs and imaging studies  CBC: Recent Labs  Lab 08/21/19 1701  WBC 18.7*  NEUTROABS 14.8*  HGB 15.2*  HCT 53.0*  MCV 100.6*  PLT 993   Basic Metabolic Panel: Recent Labs  Lab 08/21/19 1701 08/22/19 0116 08/22/19 0445 08/22/19 0809 08/22/19 1252  NA 139 155* 160* 161* 162*  K 4.7 3.4* 3.2* 3.2* 4.4  CL 92* 116* 121* 121* 122*  CO2 21* 25 27 28 29   GLUCOSE 1,434* 734* 487* 272* 181*  BUN 46* 41* 39* 39* 41*  CREATININE 2.17* 1.61* 1.33* 1.32* 1.32*  CALCIUM 10.7* 9.8 10.2 9.9 10.5*   GFR: Estimated Creatinine Clearance: 38.6 mL/min (A) (by C-G formula based on SCr of 1.32 mg/dL (H)). Liver Function Tests: Recent Labs  Lab 08/21/19 1701  AST 19  ALT 26  ALKPHOS 245*  BILITOT 1.3*   PROT 8.3*  ALBUMIN 3.5   No results for input(s): LIPASE, AMYLASE in the last 168 hours. No results for input(s): AMMONIA in the last 168 hours. Coagulation Profile: No results for input(s): INR, PROTIME in the last 168 hours. Cardiac Enzymes: No results for input(s): CKTOTAL, CKMB, CKMBINDEX, TROPONINI in the last 168 hours. BNP (last 3 results) No results for input(s): PROBNP in the last 8760 hours. HbA1C: No results for input(s): HGBA1C in the last 72 hours. CBG: Recent Labs  Lab 08/22/19 0756 08/22/19 0922 08/22/19 1023 08/22/19 1136 08/22/19 1245  GLUCAP 192* 238* 213* 205* 166*   Lipid Profile: No results for input(s): CHOL, HDL, LDLCALC, TRIG, CHOLHDL, LDLDIRECT in the last 72 hours. Thyroid Function Tests: No results for input(s): TSH, T4TOTAL, FREET4, T3FREE, THYROIDAB in the last 72 hours. Anemia Panel: No results for input(s): VITAMINB12, FOLATE, FERRITIN, TIBC, IRON, RETICCTPCT in the last 72 hours. Sepsis Labs: Recent Labs  Lab 08/21/19 1930 08/22/19 0207 08/22/19 0809 08/22/19 1020  LATICACIDVEN 4.1* 3.6* 2.6* 3.2*    Recent Results (from the past 240 hour(s))  SARS Coronavirus 2 by RT PCR (hospital order, performed in [facility] hospital lab) Nasopharyngeal Nasopharyngeal Swab     Status: None   Collection Time: 08/21/19  8:05 PM   Specimen: Nasopharyngeal Swab  Result Value Ref Range Status   SARS Coronavirus 2 NEGATIVE NEGATIVE Final    Comment: (NOTE) If result is NEGATIVE SARS-CoV-2 target nucleic acids are NOT DETECTED. The SARS-CoV-2 RNA is generally detectable in upper and lower  respiratory specimens during the acute phase of infection. The lowest  concentration of SARS-CoV-2 viral copies this assay can detect is 250  copies / mL. A negative result does not preclude SARS-CoV-2 infection  and should not be used as the sole basis for treatment or other  patient management decisions.  A negative result may occur with  improper specimen  collection / handling, submission of specimen other  than nasopharyngeal swab, presence of viral mutation(s) within the  areas targeted by this assay, and inadequate number of viral copies  (<250 copies / mL). A negative result must be combined with clinical  observations, patient history, and epidemiological information. If result is POSITIVE SARS-CoV-2 target nucleic acids are DETECTED. The SARS-CoV-2 RNA is generally detectable in upper and lower  respiratory specimens dur ing the acute phase of infection.  Positive  results are indicative of active infection with SARS-CoV-2.  Clinical  correlation with patient history and other diagnostic information is  necessary to determine patient infection status.  Positive results do  not rule out bacterial infection or co-infection with other viruses. If result is PRESUMPTIVE POSTIVE SARS-CoV-2 nucleic acids MAY BE PRESENT.   A presumptive positive result was obtained on the submitted specimen  and confirmed on repeat testing.  While 2019 novel coronavirus  (SARS-CoV-2) nucleic acids may be present in the submitted sample  additional confirmatory testing may be necessary for epidemiological  and / or clinical management purposes  to differentiate between  SARS-CoV-2 and other Sarbecovirus currently known to infect humans.  If clinically indicated additional testing with an alternate test  methodology [phone]) is advised. The SARS-CoV-2 RNA is generally  detectable in upper and lower respiratory sp ecimens during the acute  phase of infection. The expected result is Negative. Fact Sheet for Patients:  StrictlyIdeas.no Fact Sheet for Healthcare Providers: BankingDealers.co.za This test is not yet approved or cleared by the [place] FDA and has been authorized for detection and/or diagnosis of SARS-CoV-2 by FDA under an Emergency Use Authorization (EUA).  This EUA will remain in effect  (meaning this test can be used) for the duration of the COVID-19 declaration under Section 564(b)(1) of the Act, 21 U.S.C. section 360bbb-3(b)(1), unless the authorization is terminated or revoked sooner. Performed at [facility], [address] [place], [place] [address]   Blood Culture (routine x 2)     Status: None (Preliminary result)   Collection Time: 08/21/19  8:17 PM   Specimen: BLOOD  Result Value Ref Range Status   Specimen Description BLOOD BLOOD LEFT HAND  Final   Special Requests   Final    BOTTLES DRAWN AEROBIC AND ANAEROBIC Blood Culture adequate volume   Culture   Final    NO GROWTH < 12 HOURS Performed at [facility], [address] [place], [place] [address]    Report Status PENDING  Incomplete  Blood Culture (routine x 2)     Status: None (Preliminary result)   Collection Time: 08/21/19  8:18 PM   Specimen: BLOOD  Result Value Ref Range Status   Specimen Description BLOOD RIGHT ANTECUBITAL  Final   Special Requests   Final    BOTTLES DRAWN AEROBIC AND ANAEROBIC Blood Culture adequate volume   Culture   Final    NO GROWTH < 12 HOURS Performed at [facility], [address] [place], [place] [address]    Report Status PENDING  Incomplete     RN Pressure Injury Documentation:       Radiology Studies: Dg Chest Port 1 View  Result Date: 08/21/2019 CLINICAL DATA:  Sepsis EXAM: PORTABLE CHEST 1 VIEW COMPARISON:  08/04/2019 FINDINGS: Peribronchial thickening. Heart is borderline in size. No confluent opacities or effusions. No acute bony abnormality. IMPRESSION: Mild bronchitic changes. Electronically Signed   By: Rolm Baptise M.D.   On: 08/21/2019 19:59        Scheduled Meds: . carvedilol  12.5 mg Oral BID  . insulin aspart  0-15 Units Subcutaneous Q4H  . insulin glargine  15 Units Subcutaneous BID  . LORazepam  0.5 mg Intravenous Once  . pantoprazole (PROTONIX) IV  40 mg Intravenous Q12H   Continuous Infusions:  . sodium chloride    . ceFEPime (MAXIPIME) IV Stopped (08/22/19 1020)  . dextrose 75 mL/hr at 08/22/19 0948  . insulin 5 Units/hr (08/22/19 1140)  .  potassium chloride 10 mEq (08/22/19 1251)  . vancomycin       LOS: 0 days    Time spent: 30 min     Ivor Costa, DO [facility] PAGER is on [place]  If 7PM-7AM, please contact night-coverage [email or web address] Password [email or web address] 08/22/2019, 1:23 PM

## 2019-08-22 NOTE — H&P (Addendum)
History and Physical    Kristy [name] FBP:[id number] DOB: 11-Nov-1950 DOA: 08/21/2019  PCP: Owens Loffler, MD  Patient coming from: Home  I have personally briefly reviewed patient's old medical records in [place]  Chief Complaint: altered mental status  HPI: Kristy [name] is a 68 y.o. female with medical history significant of nonischemic cardiomyopathy, LBBB, systolic congestive heart failure, Crohn's disease, chronic kidney disease stage III, type 2 diabetes with hx of DKA, hypertension who presents with altered mental status.  Patient is unable to provide history as she is confused and ill-appearing. Patient reportedly was brought in by EMS from home for altered mental status and increased blood glucose.  Blood glucose with EMS in the 600s and improved down to 331 post 500 cc normal saline with EMS.  She reportedly was actively vomiting at the time of EMS arrival.  She was noted by nursing to be alert and oriented only to self in the ED and was disoriented and pulling on her leads and IV lines. She was recently discharged on 08/13/2019 with DKA and reportedly was non-compliant with her medications.   ED Course: Patient was afebrile and normotensive but tachycardic up to 130s and intermittently tachypneic up to 28 and had to be placed on 2 L via nasal cannula. CBC shows leukocytosis of 18.7, hemoglobin of 15.2 CMP showed glucose of 1434, BUN of 46, creatinine of 2.17, anion gap of 26.Troponin of 18 and 22.  Initial lactate of 7.6 with improvement down to 4.1 after fluid resuscitation. PH of 7.28, Bicarb of 23.  Chest x-ray shows mild bronchitic thick changes.  She was given 3L of NS bolus and started on vancomycin and cefepime for broad-spectrum antibiotic coverage with no clear source of infection.  Review of Systems:  Unable to obtain given patient's altered mental status  Past Medical History:  Diagnosis Date  . Allergic rhinitis   . Allergy   . Cataract    mild   . Crohn's disease ([place]) 10/13/2013  . Diverticulosis   . GERD (gastroesophageal reflux disease)   . Gout   . HFrEF (heart failure with reduced ejection fraction) ([place])    a. 12/2008 Cath: EF 45% w/ inf HK; b. 07/2009 Echo: EF 50-55%; c. 08/2018 Echo: EF 20-25%.  Marland Kitchen Hyperlipidemia   . Hypertension   . IBS (irritable bowel syndrome)   . Left bundle branch block   . Neuromuscular disorder ([facility])    neuropathy  . NICM (nonischemic cardiomyopathy) ([place])    a. 12/2008 Cath: no significant dzs, EF 45% w/ inf HK->Med Rx; b. 07/2009 Echo: EF 50-55%; c. 08/2018 Echo: EF 20-25%, ant/antsept HK, mild MR, mildly dil LA, nl RV fx; d. 08/2018 Cath: D1 80, otw nonobs dzs->Med rx.  . Non-obstructive CAD (coronary artery disease)    a. 12/2008 Cath: no significant dzs, EF 45% w/ inf HK->Med Rx; b. 08/2018 Cath: LM nl, LAD min irregs, D1 80, LCX 54md, RCA min irregs->Med Rx.  . Osteoarthritis   . Poorly controlled Diabetes mellitus    a. 07/2018 A1c 13.1.  .Marland KitchenSymptomatic cholelithiasis   . Uncontrolled type 2 diabetes mellitus with stage 3 chronic kidney disease, with long-term [place] use of insulin ([place]          Past Surgical History:  Procedure Laterality Date  . BREAST BIOPSY Right 06/24/2019   stereo bx/ x clip/ path pending  . BREAST CYST ASPIRATION    . COLONOSCOPY    . DILATION AND CURETTAGE OF UTERUS    .  LEFT HEART CATH AND CORONARY ANGIOGRAPHY N/A 04/24/2019   Procedure: LEFT HEART CATH AND CORONARY ANGIOGRAPHY;  Surgeon: Nelva Bush, MD;  Location: [facility];  Service: Cardiovascular;  Laterality: N/A;  . RIGHT/LEFT HEART CATH AND CORONARY ANGIOGRAPHY N/A 08/26/2018   Procedure: RIGHT/LEFT HEART CATH AND CORONARY ANGIOGRAPHY;  Surgeon: Wellington Hampshire, MD;  Location: [facility];  Service: Cardiovascular;  Laterality: N/A;  . SHOULDER SURGERY     15 + yrs ago   . SKIN SURGERY     nose   . VAGINAL HYSTERECTOMY       reports that she quit smoking about 22  years ago. Her smoking use included cigarettes. She has a 22.50 pack-year smoking history. She has never used smokeless tobacco. She reports that she does not drink alcohol or use drugs.  Allergies  Allergen Reactions  . Fish Oil Other (See Comments)    Gout  . Glimepiride Other (See Comments)    REACTION: hypoglycemia  . Guanfacine Hcl Other (See Comments)    REACTION: unspecified  . Rosiglitazone Other (See Comments)    CHF    Family History  Problem Relation Age of Onset  . Kidney failure Mother   . Hypertension Father   . Kidney failure Brother   . Colon cancer Neg Hx   . Colon polyps Neg Hx   . Rectal cancer Neg Hx   . Stomach cancer Neg Hx   . Breast cancer Neg Hx      Prior to Admission medications   Medication Sig Start Date End Date Taking? Authorizing Provider  acetaminophen (TYLENOL) 325 MG tablet Take 325-650 mg by mouth daily as needed for moderate pain or headache.    Yes [provider]  aspirin EC 81 MG EC tablet Take 1 tablet (81 mg total) by mouth daily. 04/29/19  Yes Sudini, Alveta Heimlich, MD  carvedilol (COREG) 12.5 MG tablet Take 1 tablet (12.5 mg total) by mouth 2 (two) times daily. 06/11/19 09/09/19 Yes Minna Merritts, MD  clopidogrel (PLAVIX) 75 MG tablet Take 1 tablet (75 mg total) by mouth daily with breakfast. 06/11/19  Yes Gollan, Kathlene November, MD  Ensure (ENSURE) Take 237 mLs by mouth daily.   Yes [provider]  ENTRESTO 49-51 MG TAKE ONE TABLET TWICE DAILY STARTING SUNDAY 09/15/18 Patient taking differently: Take 1 tablet by mouth 2 (two) times daily.  05/05/19  Yes Minna Merritts, MD  FLUoxetine (PROZAC) 20 MG capsule Take 1 capsule (20 mg total) by mouth daily. 02/12/19  Yes Copland, [name], MD  glucose blood (ONETOUCH ULTRA) test strip Use to check blood sugar up to 8 times a day. 06/25/19  Yes Copland, [name], MD  insulin regular human CONCENTRATED (HUMULIN R U-500 KWIKPEN) 500 UNIT/ML kwikpen Inject 50-100 Units into the skin 4 (four)  times daily -  before meals and at bedtime. Give 0 units insulin for blood sugar < 200  Give 20 units insulin for blood sugar 201 - 300 Give 50 units insulin for blood sugar 301 - 400 Give 90 units insulin for blood sugar 401 - 500 Give 100 units insulin for blood sugar > 500 . 07/14/19 11/11/19 Yes Gladstone Lighter, MD  Insulin Syringe-Needle U-100 (B-D INS SYRINGE 0.5CC/31GX5/16) 31G X 5/16" 0.5 ML MISC USE AS DIRECTED THREE TIMES A DAY 08/06/17  Yes Copland, [name], MD  ivabradine (CORLANOR) 5 MG TABS tablet TAKE ONE TABLET TWICE DAILY WITH MEALS 08/11/19  Yes Theora Gianotti, NP  spironolactone (ALDACTONE)  25 MG tablet Take 1 tablet (25 mg total) by mouth daily. 08/04/19  Yes Gollan, Kathlene November, MD  pantoprazole (PROTONIX) 40 MG tablet Take 1 tablet (40 mg total) by mouth daily. Patient not taking: Reported on 08/18/2019 08/16/19   Loletha Grayer, MD    Physical Exam: Vitals:   08/21/19 2245 08/21/19 2255 08/21/19 2257 08/21/19 2300  BP: (!) 112/57   (!) 99/59  Pulse: (!) 125 (!) 130 (!) 132 (!) 125  Resp: (!) 22 15 17 17   Temp:      TempSrc:      SpO2:  91% 96% 95%  Weight:      Height:        Constitutional: ill-appearing female with generalized rigor, awoke to voice but only moaned only asked questions Vitals:   08/21/19 2245 08/21/19 2255 08/21/19 2257 08/21/19 2300  BP: (!) 112/57   (!) 99/59  Pulse: (!) 125 (!) 130 (!) 132 (!) 125  Resp: (!) 22 15 17 17   Temp:      TempSrc:      SpO2:  91% 96% 95%  Weight:      Height:       Eyes: PERRL, lids and conjunctivae normal ENMT: Mucous membranes are moist with dry blood around the mouth and teeth Neck: normal, supple, no masses Respiratory: clear to auscultation bilaterally, no wheezing, no crackles. Normal respiratory effort. No accessory muscle use.  Cardiovascular: Regular rate and rhythm, no murmurs / rubs / gallops. No extremity edema. 2+ pedal pulses.  Abdomen: no tenderness, no masses palpated. Bowel  sounds positive.  Musculoskeletal: no clubbing / cyanosis. No joint deformity upper and lower extremities. no contractures. Normal muscle tone.  Skin: no rashes, lesions, ulcers. No induration Neurologic: Awoke to voice but otherwise could not answer questions and is somnolent. Unable to follow commands or cooperate with physical exam.  Psychiatric: Unable to access.     Labs on Admission: I have personally reviewed following labs and imaging studies  CBC: Recent Labs  Lab 08/21/19 1701  WBC 18.7*  NEUTROABS 14.8*  HGB 15.2*  HCT 53.0*  MCV 100.6*  PLT 195   Basic Metabolic Panel: Recent Labs  Lab 08/21/19 1701  NA 139  K 4.7  CL 92*  CO2 21*  GLUCOSE 1,434*  BUN 46*  CREATININE 2.17*  CALCIUM 10.7*   GFR: Estimated Creatinine Clearance: 23.5 mL/min (A) (by C-G formula based on SCr of 2.17 mg/dL (H)). Liver Function Tests: Recent Labs  Lab 08/21/19 1701  AST 19  ALT 26  ALKPHOS 245*  BILITOT 1.3*  PROT 8.3*  ALBUMIN 3.5   No results for input(s): LIPASE, AMYLASE in the last 168 hours. No results for input(s): AMMONIA in the last 168 hours. Coagulation Profile: No results for input(s): INR, PROTIME in the last 168 hours. Cardiac Enzymes: No results for input(s): CKTOTAL, CKMB, CKMBINDEX, TROPONINI in the last 168 hours. BNP (last 3 results) No results for input(s): PROBNP in the last 8760 hours. HbA1C: No results for input(s): HGBA1C in the last 72 hours. CBG: Recent Labs  Lab 08/21/19 1700 08/21/19 1920 08/21/19 2015 08/21/19 2143 08/21/19 2254  GLUCAP >600* >600* >600* >600* >600*   Lipid Profile: No results for input(s): CHOL, HDL, LDLCALC, TRIG, CHOLHDL, LDLDIRECT in the last 72 hours. Thyroid Function Tests: No results for input(s): TSH, T4TOTAL, FREET4, T3FREE, THYROIDAB in the last 72 hours. Anemia Panel: No results for input(s): VITAMINB12, FOLATE, FERRITIN, TIBC, IRON, RETICCTPCT in the last 72 hours. Urine  analysis:    Component  Value Date/Time   COLORURINE YELLOW (A) 08/21/2019 1730   APPEARANCEUR CLOUDY (A) 08/21/2019 1730   LABSPEC 1.026 08/21/2019 1730   PHURINE 5.0 08/21/2019 1730   GLUCOSEU >=500 (A) 08/21/2019 1730   HGBUR SMALL (A) 08/21/2019 1730   HGBUR large 03/22/2010 1533   BILIRUBINUR NEGATIVE 08/21/2019 1730   KETONESUR 5 (A) 08/21/2019 1730   PROTEINUR NEGATIVE 08/21/2019 1730   UROBILINOGEN 0.2 03/22/2010 1533   NITRITE NEGATIVE 08/21/2019 1730   LEUKOCYTESUR NEGATIVE 08/21/2019 1730    Radiological Exams on Admission: Dg Chest Port 1 View  Result Date: 08/21/2019 CLINICAL DATA:  Sepsis EXAM: PORTABLE CHEST 1 VIEW COMPARISON:  08/04/2019 FINDINGS: Peribronchial thickening. Heart is borderline in size. No confluent opacities or effusions. No acute bony abnormality. IMPRESSION: Mild bronchitic changes. Electronically Signed   By: Rolm Baptise M.D.   On: 08/21/2019 19:59    EKG: Independently reviewed.   Assessment/Plan Acute encephalopathy in the setting [facility] - Unclear precipitating cause - admit to SDU --replete potassium - continue insulin gtt with goal of 140-180 and AG <12 - IV NS until BG <250, then switch to D5 1/2 NS  - BMP q4hr  - keep NPO   Sepsis secondary unclear cause -Continue broad-spectrum antibiotics with IV vancomycin and cefepime -Blood culture and urine culture pending -Sepsis protocol started in ED  Acute on chronic kidney disease stage III - creatinine of 2.17 - follow serial BMP with aggressive fluid resuscitiation  Suspected coffee-ground emesis - seen by GI in recent admission last week and they did not recommend procedure and to take Protonix for a month  -Start IV Protonix  Chronic systolic congestive heart failure with EF 35 to 40% -Continue Coreg and Aldactone when able. Hold Entresto for borderline soft BP. - Hold Ivabradine due to AKI     DVT prophylaxis:SCD Code Status:Full Family Communication: No family at bedside to update disposition  Plan: Home with at least 2 midnight stays  Consults called:  Admission status: inpatient, admit to [facility]   If 7PM-7AM, please contact night-coverage [email or web address] Password [facility]  08/22/2019, 12:36 AM

## 2019-08-23 DIAGNOSIS — I447 Left bundle-branch block, unspecified: Secondary | ICD-10-CM

## 2019-08-23 LAB — CBC
HCT: 43.7 % (ref 36.0–46.0)
Hemoglobin: 13.8 g/dL (ref 12.0–15.0)
MCH: 29.1 pg (ref 26.0–34.0)
MCHC: 31.6 g/dL (ref 30.0–36.0)
MCV: 92 fL (ref 80.0–100.0)
Platelets: 199 10*3/uL (ref 150–400)
RBC: 4.75 MIL/uL (ref 3.87–5.11)
RDW: 13.8 % (ref 11.5–15.5)
WBC: 16 10*3/uL — ABNORMAL HIGH (ref 4.0–10.5)
nRBC: 0 % (ref 0.0–0.2)

## 2019-08-23 LAB — GLUCOSE, CAPILLARY
Glucose-Capillary: 253 mg/dL — ABNORMAL HIGH (ref 70–99)
Glucose-Capillary: 192 mg/dL — ABNORMAL HIGH (ref 70–99)
Glucose-Capillary: 310 mg/dL — ABNORMAL HIGH (ref 70–99)
Glucose-Capillary: 176 mg/dL — ABNORMAL HIGH (ref 70–99)
Glucose-Capillary: 271 mg/dL — ABNORMAL HIGH (ref 70–99)
Glucose-Capillary: 201 mg/dL — ABNORMAL HIGH (ref 70–99)

## 2019-08-23 LAB — OSMOLALITY: Osmolality: 339 mOsm/kg (ref 275–295)

## 2019-08-23 LAB — MAGNESIUM: Magnesium: 2.1 mg/dL (ref 1.7–2.4)

## 2019-08-23 MED ORDER — SODIUM CHLORIDE 0.45 % IV BOLUS: 500.0000 mL | Freq: Once | INTRAVENOUS | Status: DC

## 2019-08-23 MED ORDER — INSULIN GLARGINE 100 UNIT/ML ~~LOC~~ SOLN
18.0000 [IU] | Freq: Two times a day (BID) | SUBCUTANEOUS | Status: DC
  Administered 2019-08-23 (×2): 18 [IU] via SUBCUTANEOUS
  Filled 2019-08-23 (×4): qty 0.18

## 2019-08-23 MED ORDER — INSULIN GLARGINE 100 UNIT/ML ~~LOC~~ SOLN
18.0000 [IU] | Freq: Two times a day (BID) | SUBCUTANEOUS | Status: DC
  Filled 2019-08-23: qty 0.18

## 2019-08-23 MED ORDER — ASPIRIN 81 MG PO TBEC: 81.0000 mg | DELAYED_RELEASE_TABLET | Freq: Every day | ORAL | Status: DC

## 2019-08-23 NOTE — Progress Notes (Signed)
PROGRESS NOTE    Kristy [name]  LTJ:[id number] DOB: October 26, 1950 DOA: 08/21/2019 PCP: Owens Loffler, MD    Brief Narrative:  Kristy [name] is a 68 y.o. female with medical history significant of nonischemic cardiomyopathy, LBBB, systolic congestive heart failure, Crohn's disease, chronic kidney disease stage III, type 2 diabetes with hx of DKA, hypertension who presents with altered mental status.  Patient is unable to provide history as she is confused and ill-appearing. Patient reportedly was brought in by EMS from home for altered mental status and increased blood glucose.  Blood glucose with EMS in the 600s and improved down to 331 post 500 cc normal saline with EMS.  She reportedly was actively vomiting at the time of EMS arrival.  She was noted by nursing to be alert and oriented only to self in the ED and was disoriented and pulling on her leads and IV lines. She was recently discharged on 08/13/2019 with DKA and reportedly was non-compliant with her medications.   ED Course: Patient was afebrile and normotensive but tachycardic up to 130s and intermittently tachypneic up to 28 and had to be placed on 2 L via nasal cannula. CBC shows leukocytosis of 18.7, hemoglobin of 15.2 CMP showed glucose of 1434, BUN of 46, creatinine of 2.17, anion gap of 26.Troponin of 18 and 22.  Initial lactate of 7.6 with improvement down to 4.1 after fluid resuscitation. PH of 7.28, Bicarb of 23.  Chest x-ray shows mild bronchitic thick changes. She was given 3L of NS bolus and started on vancomycin and cefepime for broad-spectrum antibiotic coverage with no clear source of infection.  Interim History: - 11/20: CT-head negative - 11/20: d/c'ed insulin gtt after blood sugar improved, started lantus and SSI  Subjective:  Mental status improved, patient is oriented to the place and person, but not to time.  Denies chest pain, shortness of breath, cough, fever or chills.  No nausea, vomiting,  diarrhea or abdominal pain.  Patient states that she missed several doses of her insulin recently.  Assessment & Plan:   Principal Problem:   Hyperosmolar hyperglycemic state (HHS) ([place]) Active Problems:   Uncontrolled type 2 diabetes mellitus with stage 3 chronic kidney disease, with long-term current use of insulin (HCC)   Essential hypertension   LBBB (left bundle branch block)   Coronary artery disease, non-occlusive   Chronic systolic CHF (congestive heart failure) (HCC)   NICM (nonischemic cardiomyopathy) (HCC)   Elevated troponin   Hypernatremia   Sepsis ([place])   Acute renal failure superimposed on stage 3a chronic kidney disease ([place])   Acute metabolic encephalopathy   Hyperosmolar hyperglycemic state with coma (HHS) ([place]): this is due to medication noncompliance. Mental status improved, patient is oriented to the place and person, but not to time. 11/20: d/c'ed insulin gtt after blood sugar improved, started lantus and SSI - Lantus 15 unit bid -->increased to 18 units bid. - SSI - continue IVF, 1/2 NS at 75 cc/h  Acute metabolic encephalopathy: improving, but not back to baseline yet. CT-head negative. -Frequent neuro check  Sepsis: Patient meets criteria for sepsis with leukocytosis, tachycardia, tachypnea.  Etiology is not clear.  Patient has elevated lactic acid 7.6, 4.1, 2.6, 3.2,  Chest x-ray showed mild bronchitis change.  Urinalysis negative.  COVID-19 negative. -Continue vancomycin and cefepime. -Follow-up of blood culture -will get Procalcitonin and trend lactic acid levels per sepsis protocol. -IVF: 3L of NS bolus in ED, followed by 75 cc/h of 1/2 NS  Acute on chronic kidney disease stage IIIa: Baseline creatinine 1.0-1.4.  Her creatinine was 1.61 on admission - follow serial BMP with aggressive fluid resuscitiation  Lab Results  Component Value Date   CREATININE 1.30 (H) 08/22/2019   CREATININE 1.32 (H) 08/22/2019   CREATININE 1.32 (H) 08/22/2019     Suspected coffee-ground emesis: Hgb stable 15.2 -->13.8. Seen by GI in recent admission last week and they did not recommend procedure and to take Protonix for a month  -on IV Protonix  Chronic systolic congestive heart failure with EF 35 to 40%. BNP 207. No signs of acute exacerbation -Continue Coreg and Aldactone when able. Hold Entresto for borderline soft BP and [id number]  Uncontrolled type 2 diabetes mellitus with stage 3 chronic kidney disease, with long-term current use of insulin ([place]): A1c 10.9 on 07/08/19.  - Lantus 15 unit bid -->increased to 18 units bid. - SSI  Essential hypertension: Bp soft -hold Bp meds  Coronary artery disease, non-occlusive and elevated trop: Trop 19 -->22. Likely due to demand ischemia -Hold aspirin and Plavix due to possible coffee-ground emesis.  Hypernatremia: Na 162 --> 159  -1/2 NS at 75 cc/h   DVT prophylaxis: SCD Code Status: full Family Communication:  None, not at bed side Discharge plan: Patient mental status improves, but has not come back to baseline.  Blood sugar is still fluctuates.  Not ready for discharge.   Consultants:   none  Procedures:  none  Antimicrobials:  Anti-infectives (From admission, onward)   Start     Dose/Rate Route Frequency Ordered Stop   08/22/19 2200  vancomycin (VANCOCIN) IVPB 750 mg/150 ml premix     750 mg 150 mL/hr over 60 Minutes Intravenous Every 24 hours 08/22/19 0701     08/22/19 0900  ceFEPIme (MAXIPIME) 2 g in sodium chloride 0.9 % 100 mL IVPB     2 g 200 mL/hr over 30 Minutes Intravenous Every 12 hours 08/22/19 0701     08/22/19 0140  vancomycin variable dose per unstable renal function (pharmacist dosing)  Status:  Discontinued      Does not apply See admin instructions 08/22/19 0140 08/22/19 0925   08/21/19 2000  vancomycin (VANCOCIN) 1,250 mg in sodium chloride 0.9 % 250 mL IVPB     1,250 mg 166.7 mL/hr over 90 Minutes Intravenous  Once 08/21/19 1943 08/22/19 0156   08/21/19  1945  ceFEPIme (MAXIPIME) 2 g in sodium chloride 0.9 % 100 mL IVPB     2 g 200 mL/hr over 30 Minutes Intravenous  Once 08/21/19 1940 08/21/19 2153   08/21/19 1945  vancomycin (VANCOCIN) IVPB 1000 mg/200 mL premix  Status:  Discontinued     1,000 mg 200 mL/hr over 60 Minutes Intravenous  Once 08/21/19 1940 08/21/19 1943       Objective: Vitals:   08/23/19 1400 08/23/19 1500 08/23/19 1700 08/23/19 1800  BP: 112/80   132/62  Pulse: 95 88 88 95  Resp: (!) 21 15 17    Temp:      TempSrc:      SpO2: 96% 95% 96% 98%  Weight:      Height:        Intake/Output Summary (Last 24 hours) at 08/23/2019 1855 Last data filed at 08/23/2019 1800 Gross per 24 hour  Intake 3202.91 ml  Output 650 ml  Net 2552.91 ml   Filed Weights   08/21/19 1709  Weight: 68 kg    Examination:  Physical Exam: Dry mucous membrane  General: Not in acute  distress HEENT: PERRL, EOMI, no scleral icterus, No JVD or bruit Cardiac: S1/S2, RRR, No murmurs, gallops or rubs Pulm:  No rales, wheezing, rhonchi or rubs. Abd: Soft, nondistended, nontender,  no organomegaly, BS present Ext: No edema. 1+DP/PT pulse bilaterally Musculoskeletal: No joint deformities, erythema, or stiffness, ROM full Skin: No rashes.  Neuro: Patient is alert, orientated to place and person, but not time.  She moves all extremities normally. Psych: Patient is not psychotic, no suicidal or hemocidal ideation.    Data Reviewed: I have personally reviewed following labs and imaging studies  CBC: Recent Labs  Lab 08/21/19 1701 08/23/19 0551  WBC 18.7* 16.0*  NEUTROABS 14.8*  --   HGB 15.2* 13.8  HCT 53.0* 43.7  MCV 100.6* 92.0  PLT 319 751   Basic Metabolic Panel: Recent Labs  Lab 08/22/19 0116 08/22/19 0445 08/22/19 0809 08/22/19 1252 08/22/19 1622 08/23/19 0551  NA 155* 160* 161* 162* 159*  --   K 3.4* 3.2* 3.2* 4.4 3.6  --   CL 116* 121* 121* 122* 122*  --   CO2 25 27 28 29 28   --   GLUCOSE 734* 487* 272* 181* 198*   --   BUN 41* 39* 39* 41* 40*  --   CREATININE 1.61* 1.33* 1.32* 1.32* 1.30*  --   CALCIUM 9.8 10.2 9.9 10.5* 9.5  --   MG  --   --   --   --   --  2.1   GFR: Estimated Creatinine Clearance: 39.2 mL/min (A) (by C-G formula based on SCr of 1.3 mg/dL (H)). Liver Function Tests: Recent Labs  Lab 08/21/19 1701  AST 19  ALT 26  ALKPHOS 245*  BILITOT 1.3*  PROT 8.3*  ALBUMIN 3.5   No results for input(s): LIPASE, AMYLASE in the last 168 hours. No results for input(s): AMMONIA in the last 168 hours. Coagulation Profile: No results for input(s): INR, PROTIME in the last 168 hours. Cardiac Enzymes: No results for input(s): CKTOTAL, CKMB, CKMBINDEX, TROPONINI in the last 168 hours. BNP (last 3 results) No results for input(s): PROBNP in the last 8760 hours. HbA1C: No results for input(s): HGBA1C in the last 72 hours. CBG: Recent Labs  Lab 08/23/19 0342 08/23/19 0727 08/23/19 1127 08/23/19 1615 08/23/19 1807  GLUCAP 253* 192* 310* 176* 271*   Lipid Profile: No results for input(s): CHOL, HDL, LDLCALC, TRIG, CHOLHDL, LDLDIRECT in the last 72 hours. Thyroid Function Tests: No results for input(s): TSH, T4TOTAL, FREET4, T3FREE, THYROIDAB in the last 72 hours. Anemia Panel: No results for input(s): VITAMINB12, FOLATE, FERRITIN, TIBC, IRON, RETICCTPCT in the last 72 hours. Sepsis Labs: Recent Labs  Lab 08/21/19 1930 08/22/19 0207 08/22/19 0809 08/22/19 1020  LATICACIDVEN 4.1* 3.6* 2.6* 3.2*    Recent Results (from the past 240 hour(s))  SARS Coronavirus 2 by RT PCR (hospital order, performed in [facility] hospital lab) Nasopharyngeal Nasopharyngeal Swab     Status: None   Collection Time: 08/21/19  8:05 PM   Specimen: Nasopharyngeal Swab  Result Value Ref Range Status   SARS Coronavirus 2 NEGATIVE NEGATIVE Final    Comment: (NOTE) If result is NEGATIVE SARS-CoV-2 target nucleic acids are NOT DETECTED. The SARS-CoV-2 RNA is generally detectable in upper and lower   respiratory specimens during the acute phase of infection. The lowest  concentration of SARS-CoV-2 viral copies this assay can detect is 250  copies / mL. A negative result does not preclude SARS-CoV-2 infection  and should not be used as  the sole basis for treatment or other  patient management decisions.  A negative result may occur with  improper specimen collection / handling, submission of specimen other  than nasopharyngeal swab, presence of viral mutation(s) within the  areas targeted by this assay, and inadequate number of viral copies  (<250 copies / mL). A negative result must be combined with clinical  observations, patient history, and epidemiological information. If result is POSITIVE SARS-CoV-2 target nucleic acids are DETECTED. The SARS-CoV-2 RNA is generally detectable in upper and lower  respiratory specimens dur ing the acute phase of infection.  Positive  results are indicative of active infection with SARS-CoV-2.  Clinical  correlation with patient history and other diagnostic information is  necessary to determine patient infection status.  Positive results do  not rule out bacterial infection or co-infection with other viruses. If result is PRESUMPTIVE POSTIVE SARS-CoV-2 nucleic acids MAY BE PRESENT.   A presumptive positive result was obtained on the submitted specimen  and confirmed on repeat testing.  While 2019 novel coronavirus  (SARS-CoV-2) nucleic acids may be present in the submitted sample  additional confirmatory testing may be necessary for epidemiological  and / or clinical management purposes  to differentiate between  SARS-CoV-2 and other Sarbecovirus currently known to infect humans.  If clinically indicated additional testing with an alternate test  methodology [phone]) is advised. The SARS-CoV-2 RNA is generally  detectable in upper and lower respiratory sp ecimens during the acute  phase of infection. The expected result is Negative. Fact  Sheet for Patients:  StrictlyIdeas.no Fact Sheet for Healthcare Providers: BankingDealers.co.za This test is not yet approved or cleared by the [place] FDA and has been authorized for detection and/or diagnosis of SARS-CoV-2 by FDA under an Emergency Use Authorization (EUA).  This EUA will remain in effect (meaning this test can be used) for the duration of the COVID-19 declaration under Section 564(b)(1) of the Act, 21 U.S.C. section 360bbb-3(b)(1), unless the authorization is terminated or revoked sooner. Performed at [facility], [address], [place], [place] [address]   Blood Culture (routine x 2)     Status: None (Preliminary result)   Collection Time: 08/21/19  8:17 PM   Specimen: BLOOD  Result Value Ref Range Status   Specimen Description BLOOD BLOOD LEFT HAND  Final   Special Requests   Final    BOTTLES DRAWN AEROBIC AND ANAEROBIC Blood Culture adequate volume   Culture   Final    NO GROWTH 2 DAYS Performed at [facility], [address], [place], [place] [address]    Report Status PENDING  Incomplete  Blood Culture (routine x 2)     Status: None (Preliminary result)   Collection Time: 08/21/19  8:18 PM   Specimen: BLOOD  Result Value Ref Range Status   Specimen Description BLOOD RIGHT ANTECUBITAL  Final   Special Requests   Final    BOTTLES DRAWN AEROBIC AND ANAEROBIC Blood Culture adequate volume   Culture   Final    NO GROWTH 2 DAYS Performed at [facility], [address], [place],  [address]    Report Status PENDING  Incomplete    Radiology Studies: Ct Head Wo Contrast  Result Date: 08/22/2019 CLINICAL DATA:  Altered mental status, increased weakness, elevated blood sugar, history Crohn's disease, hypertension, non ischemic cardiomyopathy, diabetes mellitus type II EXAM: CT HEAD WITHOUT CONTRAST TECHNIQUE: Contiguous axial images were obtained from the base of the  skull through the vertex without  intravenous contrast. Sagittal and coronal MPR images reconstructed from axial data set. COMPARISON:  08/05/2019 FINDINGS: Brain: Generalized atrophy. Normal ventricular morphology. No midline shift or mass effect. Minimal small vessel chronic ischemic changes of deep cerebral white matter. No intracranial hemorrhage, mass lesion, or evidence of acute infarction. Vascular: No hyperdense vessels. Skull: Intact though demineralized Sinuses/Orbits: Clear Other: N/A IMPRESSION: Atrophy with minimal small vessel chronic ischemic changes of deep cerebral white matter. No acute intracranial abnormalities. Electronically Signed   By: Lavonia Dana M.D.   On: 08/22/2019 14:36   Dg Chest Port 1 View  Result Date: 08/21/2019 CLINICAL DATA:  Sepsis EXAM: PORTABLE CHEST 1 VIEW COMPARISON:  08/04/2019 FINDINGS: Peribronchial thickening. Heart is borderline in size. No confluent opacities or effusions. No acute bony abnormality. IMPRESSION: Mild bronchitic changes. Electronically Signed   By: Rolm Baptise M.D.   On: 08/21/2019 19:59        Scheduled Meds:  carvedilol  12.5 mg Oral BID   Chlorhexidine Gluconate Cloth  6 each Topical Daily   insulin aspart  0-15 Units Subcutaneous Q4H   insulin glargine  18 Units Subcutaneous BID   pantoprazole (PROTONIX) IV  40 mg Intravenous Q12H   Continuous Infusions:  sodium chloride 75 mL/hr at 08/23/19 0700   ceFEPime (MAXIPIME) IV 2 g (08/23/19 0921)   vancomycin 750 mg (08/22/19 2340)     LOS: 1 day    Time spent: 30 min     Ivor Costa, DO [facility] PAGER is on AMION  If 7PM-7AM, please contact night-coverage [email or web address] Password [facility] 08/23/2019, 6:55 PM

## 2019-08-23 NOTE — Progress Notes (Signed)
Inpatient Diabetes Program Recommendations  AACE/ADA: New Consensus Statement on Inpatient Glycemic Control   Target Ranges:  Prepandial:   less than 140 mg/dL      Peak postprandial:   less than 180 mg/dL (1-2 hours)      Critically ill patients:  140 - 180 mg/dL     Review of Glycemic Control Results for COHEN, DOLEMAN (MRN [id number]) as of 08/23/2019 07:14  Ref. Range 08/22/2019 15:08 08/22/2019 16:14 08/22/2019 16:57 08/22/2019 20:53 08/22/2019 22:48 08/22/2019 23:53 08/23/2019 03:42  Glucose-Capillary Latest Ref Range: 70 - 99 mg/dL 162 (H) 170 (H) 177 (H) 208 (H) 230 (H) 276 (H) 253 (H)   Diabetes history:DM2 Outpatient Diabetes medications:Humulin RU500 Insulin QID (with meals and at bedtime) per SSI <200- 0 units 201-300- 20 units 300-400- 50 units 401-500- 90 units >500- 100 units Current orders for Inpatient glycemic control:  Lantus 15 units bid Novolog 0-15 units Q4 hours  A1c >14%  Inpatient Diabetes Program Recommendations:   Increase Lantus to 18-20 units bid   Thanks, Tama Headings RN, MSN, BC-ADM Inpatient Diabetes Coordinator Team Pager [phone] (8a-5p)

## 2019-08-23 NOTE — Evaluation (Signed)
Physical Therapy Evaluation Patient Details Name: Kristy [name] MRN: [id number] DOB: 02-05-1951 Today's Date: 08/23/2019   History of Present Illness  68 y.o. female was admitted again after a two week return home, noted to have AMS, acute encephalopathy, sepsis and coffee ground emesis.  Pt reports falls but has a great deal of issues with memory both acutely and LT, for example did not know her age.  PMHx:  CAD, nonischemic cardiomyopathy, DKA, DM, hematemesis, encephalopathy, hypernatremia, UTI with sepsis, CHF, AMS, EF 35-40%  Clinical Impression  Pt was seen for mobility and to strengthen her legs, as well as to determine her ability to maneuver with or without help.  Her ability is more dependent than at prev admit, and will need to consider SNF stay as well as to be in an assisted living situation at dc from this rehab.  Pt is unable to think about the consequences now, and hopefully family can assist her with this decision.    Follow Up Recommendations SNF    Equipment Recommendations  None recommended by PT    Recommendations for Other Services       Precautions / Restrictions Precautions Precautions: Fall Restrictions Weight Bearing Restrictions: No      Mobility  Bed Mobility Overal bed mobility: Needs Assistance Bed Mobility: Supine to Sit;Sit to Supine     Supine to sit: Min guard Sit to supine: Min guard      Transfers Overall transfer level: Needs assistance Equipment used: 1 person hand held assist Transfers: Sit to/from Stand Sit to Stand: Min assist         General transfer comment: mult efforts to stand and requires help from PT to capture balance with HHA  Ambulation/Gait Ambulation/Gait assistance: Min assist Gait Distance (Feet): 10 Feet Assistive device: 1 person hand held assist       General Gait Details: considerable posterior listing and lateral listing with HHA to control balance  Stairs            Wheelchair Mobility     Modified Rankin (Stroke Patients Only)       Balance Overall balance assessment: Needs assistance;History of Falls Sitting-balance support: Bilateral upper extremity supported;Feet supported Sitting balance-Leahy Scale: Fair     Standing balance support: Bilateral upper extremity supported;During functional activity Standing balance-Leahy Scale: Poor Standing balance comment: pt is getting a great deal of assist from PT for support, but is able to take some short shaky steps                             Pertinent Vitals/Pain Pain Assessment: No/denies pain    Home Living Family/patient expects to be discharged to:: Skilled nursing facility Living Arrangements: Alone Available Help at Discharge: Family;Available PRN/intermittently Type of Home: House Home Access: Stairs to enter Entrance Stairs-Rails: Can reach both Entrance Stairs-Number of Steps: 5 Home Layout: One level Home Equipment: Walker - 2 wheels;Cane - single point;Shower seat;Crutches;Grab bars - tub/shower Additional Comments: family is expecting to send her to rehab    Prior Function Level of Independence: Independent         Comments: excessive numbers of falls     Hand Dominance   Dominant Hand: Right    Extremity/Trunk Assessment   Upper Extremity Assessment Upper Extremity Assessment: Overall WFL for tasks assessed    Lower Extremity Assessment Lower Extremity Assessment: Generalized weakness    Cervical / Trunk Assessment Cervical / Trunk Assessment: Kyphotic  Communication   Communication: No difficulties  Cognition Arousal/Alertness: Awake/alert Behavior During Therapy: Impulsive;Restless Overall Cognitive Status: No family/caregiver present to determine baseline cognitive functioning                                 General Comments: short term memory changes and cannot give accurate LT information much of the time      General Comments General comments  (skin integrity, edema, etc.): pt maintained O2 sats with all activity and did not demonstrate excessive HR with acitivity    Exercises     Assessment/Plan    PT Assessment Patient needs continued PT services  PT Problem List Decreased strength;Decreased mobility;Decreased activity tolerance;Decreased balance;Decreased knowledge of use of DME;Decreased safety awareness;Decreased cognition;Decreased coordination;Cardiopulmonary status limiting activity       PT Treatment Interventions DME instruction;Therapeutic exercise;Gait training;Stair training;Functional mobility training;Therapeutic activities;Patient/family education;Balance training    PT Goals (Current goals can be found in the Care Plan section)  Acute Rehab PT Goals Patient Stated Goal: to be able to go home PT Goal Formulation: With patient Time For Goal Achievement: 09/06/19 Potential to Achieve Goals: Good    Frequency Min 2X/week   Barriers to discharge Decreased caregiver support;Inaccessible home environment pt has functionally declined since her last admission    Co-evaluation               AM-PAC PT "6 Clicks" Mobility  Outcome Measure Help needed turning from your back to your side while in a flat bed without using bedrails?: A Little Help needed moving from lying on your back to sitting on the side of a flat bed without using bedrails?: A Little Help needed moving to and from a bed to a chair (including a wheelchair)?: A Little Help needed standing up from a chair using your arms (e.g., wheelchair or bedside chair)?: A Little Help needed to walk in hospital room?: A Lot Help needed climbing 3-5 steps with a railing? : Total 6 Click Score: 15    End of Session Equipment Utilized During Treatment: Gait belt Activity Tolerance: Patient tolerated treatment well Patient left: in bed;with call bell/phone within reach;with bed alarm set Nurse Communication: Mobility status PT Visit Diagnosis:  Unsteadiness on feet (R26.81);History of falling (Z91.81);Difficulty in walking, not elsewhere classified (R26.2);Muscle weakness (generalized) (M62.81)    Time: 5038-8828 PT Time Calculation (min) (ACUTE ONLY): 23 min   Charges:   PT Evaluation $PT Eval Moderate Complexity: 1 Mod PT Treatments $Gait Training: 8-22 mins       Ramond Dial 08/23/2019, 9:07 PM   Mee Hives, PT MS Acute Rehab Dept. Number: [place] [facility] [place]

## 2019-08-24 LAB — CBC
HCT: 35.8 % — ABNORMAL LOW (ref 36.0–46.0)
Hemoglobin: 11.7 g/dL — ABNORMAL LOW (ref 12.0–15.0)
MCH: 29 pg (ref 26.0–34.0)
MCHC: 32.7 g/dL (ref 30.0–36.0)
MCV: 88.8 fL (ref 80.0–100.0)
Platelets: 142 10*3/uL — ABNORMAL LOW (ref 150–400)
RBC: 4.03 MIL/uL (ref 3.87–5.11)
RDW: 13.5 % (ref 11.5–15.5)
WBC: 9.6 10*3/uL (ref 4.0–10.5)
nRBC: 0 % (ref 0.0–0.2)

## 2019-08-24 LAB — GLUCOSE, CAPILLARY
Glucose-Capillary: 111 mg/dL — ABNORMAL HIGH (ref 70–99)
Glucose-Capillary: 139 mg/dL — ABNORMAL HIGH (ref 70–99)
Glucose-Capillary: 221 mg/dL — ABNORMAL HIGH (ref 70–99)
Glucose-Capillary: 270 mg/dL — ABNORMAL HIGH (ref 70–99)
Glucose-Capillary: 318 mg/dL — ABNORMAL HIGH (ref 70–99)
Glucose-Capillary: 449 mg/dL — ABNORMAL HIGH (ref 70–99)

## 2019-08-24 LAB — BASIC METABOLIC PANEL
Anion gap: 10 (ref 5–15)
BUN: 28 mg/dL — ABNORMAL HIGH (ref 8–23)
CO2: 20 mmol/L — ABNORMAL LOW (ref 22–32)
Calcium: 8.3 mg/dL — ABNORMAL LOW (ref 8.9–10.3)
Chloride: 115 mmol/L — ABNORMAL HIGH (ref 98–111)
Creatinine, Ser: 0.8 mg/dL (ref 0.44–1.00)
GFR calc Af Amer: >60 mL/min (ref >60)
GFR calc non Af Amer: >60 mL/min (ref >60)
Glucose, Bld: 127 mg/dL — ABNORMAL HIGH (ref 70–99)
Potassium: 3.2 mmol/L — ABNORMAL LOW (ref 3.5–5.1)
Sodium: 145 mmol/L (ref 135–145)

## 2019-08-24 LAB — BRAIN NATRIURETIC PEPTIDE: B Natriuretic Peptide: 303 pg/mL — ABNORMAL HIGH (ref 0.0–100.0)

## 2019-08-24 LAB — GLUCOSE, RANDOM: Glucose, Bld: 425 mg/dL — ABNORMAL HIGH (ref 70–99)

## 2019-08-24 LAB — MAGNESIUM: Magnesium: 1.7 mg/dL (ref 1.7–2.4)

## 2019-08-24 MED ORDER — ASPIRIN 81 MG PO TBEC
81.0000 mg | DELAYED_RELEASE_TABLET | Freq: Every day | ORAL | Status: DC
  Administered 2019-08-24: 81 mg via ORAL
  Filled 2019-08-24 (×2): qty 1

## 2019-08-24 MED ORDER — NYSTATIN 100000 UNIT/GM EX OINT
TOPICAL_OINTMENT | Freq: Two times a day (BID) | CUTANEOUS | Status: DC
  Administered 2019-08-24: 1 via TOPICAL
  Administered 2019-08-24 – 2019-09-02 (×13): via TOPICAL
  Administered 2019-09-02: 1 via TOPICAL
  Administered 2019-09-03 – 2019-09-14 (×12): via TOPICAL
  Filled 2019-08-24 (×2): qty 15

## 2019-08-24 MED ORDER — INSULIN GLARGINE 100 UNIT/ML ~~LOC~~ SOLN: 20.0000 [IU] | Freq: Two times a day (BID) | SUBCUTANEOUS | Status: DC

## 2019-08-24 MED ORDER — INSULIN ASPART 100 UNIT/ML ~~LOC~~ SOLN
20.0000 [IU] | Freq: Once | SUBCUTANEOUS | Status: AC
  Administered 2019-08-24: 17:00:00 20 [IU] via SUBCUTANEOUS

## 2019-08-24 MED ORDER — INSULIN ASPART 100 UNIT/ML ~~LOC~~ SOLN
0.0000 [IU] | SUBCUTANEOUS | Status: DC
  Administered 2019-08-24 – 2019-08-25 (×2): 11 [IU] via SUBCUTANEOUS
  Administered 2019-08-25: 08:00:00 7 [IU] via SUBCUTANEOUS
  Filled 2019-08-24 (×3): qty 1

## 2019-08-24 MED ORDER — INSULIN GLARGINE 100 UNIT/ML ~~LOC~~ SOLN
20.0000 [IU] | Freq: Two times a day (BID) | SUBCUTANEOUS | Status: DC
  Administered 2019-08-24 – 2019-08-26 (×4): 20 [IU] via SUBCUTANEOUS
  Filled 2019-08-24 (×7): qty 0.2

## 2019-08-24 MED ORDER — INSULIN ASPART 100 UNIT/ML ~~LOC~~ SOLN
SUBCUTANEOUS | Status: AC
  Administered 2019-08-24: 20 [IU] via SUBCUTANEOUS
  Filled 2019-08-24: qty 1

## 2019-08-24 MED ORDER — PANTOPRAZOLE SODIUM 40 MG PO TBEC
40.0000 mg | DELAYED_RELEASE_TABLET | Freq: Every day | ORAL | Status: DC
  Administered 2019-08-24 – 2019-08-28 (×5): 40 mg via ORAL
  Filled 2019-08-24 (×5): qty 1

## 2019-08-24 MED ORDER — CLOPIDOGREL BISULFATE 75 MG PO TABS: 75.0000 mg | ORAL_TABLET | Freq: Every day | ORAL | Status: DC

## 2019-08-24 MED ORDER — FLUOXETINE HCL 20 MG PO CAPS
20.0000 mg | ORAL_CAPSULE | Freq: Every day | ORAL | Status: DC
  Administered 2019-08-24 – 2019-09-23 (×31): 20 mg via ORAL
  Filled 2019-08-24 (×32): qty 1

## 2019-08-24 MED ORDER — VANCOMYCIN HCL 10 G IV SOLR
1250.0000 mg | INTRAVENOUS | Status: DC
  Administered 2019-08-25: 1250 mg via INTRAVENOUS
  Filled 2019-08-24 (×3): qty 1250

## 2019-08-24 NOTE — Progress Notes (Signed)
Inpatient Diabetes Program Recommendations  AACE/ADA: New Consensus Statement on Inpatient Glycemic Control   Target Ranges:  Prepandial:   less than 140 mg/dL      Peak postprandial:   less than 180 mg/dL (1-2 hours)      Critically ill patients:  140 - 180 mg/dL   Review of Glycemic Control Results for Kristy [name], Kristy [name] (MRN [id number]) as of 08/24/2019 07:48  Ref. Range 08/23/2019 07:27 08/23/2019 11:27 08/23/2019 16:15 08/23/2019 18:07 08/23/2019 20:29 08/24/2019 00:24 08/24/2019 04:06  Glucose-Capillary Latest Ref Range: 70 - 99 mg/dL 192 (H) 310 (H) 176 (H) 271 (H) 201 (H) 221 (H) 111 (H)    Diabetes history:DM2 Outpatient Diabetes medications:Humulin RU500 Insulin QID (with meals and at bedtime) per SSI <200- 0 units 201-300- 20 units 300-400- 50 units 401-500- 90 units >500- 100 units Current orders for Inpatient glycemic control:  Lantus 158units bid Novolog 0-15 units Q4 hours  A1c >14%  Inpatient Diabetes Program Recommendations:   Fasting glucose looks better, 111 this am. Glucose increases with meals.  Pt may benefit from Novolog 3 units tid meal coverage if eating 50% of meals.    Thanks, Tama Headings RN, MSN, BC-ADM Inpatient Diabetes Coordinator Team Pager [phone] (8a-5p)

## 2019-08-24 NOTE — Progress Notes (Signed)
Pharmacy Antibiotic Note  Kristy [name] is a 68 y.o. female admitted on 08/21/2019 with HHS. Patient being treated for presumed sepsis. Plan is to potentially narrow therapy on 11/23.   Plan: Advance vancomycin to 1272m IV Q24hr with next dose.   Continue cefepime at 2g IV Q12hr.   Will obtain creatinine with am labs.   Height: 5' 4"  (162.6 cm) Weight: 150 lb (68 kg) IBW/kg (Calculated) : 54.7  Temp (24hrs), Avg:98 F (36.7 C), Min:97.9 F (36.6 C), Max:98.1 F (36.7 C)  Recent Labs  Lab 08/21/19 1701 08/21/19 1814 08/21/19 1930  08/22/19 0207 08/22/19 0445 08/22/19 0809 08/22/19 1020 08/22/19 1252 08/22/19 1622 08/23/19 0551 08/24/19 0449  WBC 18.7*  --   --   --   --   --   --   --   --   --  16.0* 9.6  CREATININE 2.17*  --   --    < >  --  1.33* 1.32*  --  1.32* 1.30*  --  0.80  LATICACIDVEN  --  7.6* 4.1*  --  3.6*  --  2.6* 3.2*  --   --   --   --    < > = values in this interval not displayed.    Estimated Creatinine Clearance: 63.8 mL/min (by C-G formula based on SCr of 0.8 mg/dL).    Allergies  Allergen Reactions  . Fish Oil Other (See Comments)    Gout  . Glimepiride Other (See Comments)    REACTION: hypoglycemia  . Guanfacine Hcl Other (See Comments)    REACTION: unspecified  . Rosiglitazone Other (See Comments)    CHF    Antibiotics: Cefepime 11/19 >> Vancomycin 11/19 >>  Micro:  11/19 Blood Cultures: no growth x 3 days 11/19 SARS Coronavirus: negative    Thank you for allowing pharmacy to be a part of this patient's care.  MLS 08/24/2019 8:05 PM

## 2019-08-24 NOTE — Progress Notes (Signed)
Patient confused through the shift. She was talking about "getting home so her family will not take her house". She was trying to get out of bed several times with out help and removing the medical equipment. She was incontinent several times and did not realize it. The RN spent a significant portion of the night redirecting and convincing the patient to accept care and safety. She is now calm. Will endorse.

## 2019-08-24 NOTE — Progress Notes (Addendum)
PROGRESS NOTE    LORELEE [name]  QPY:[id number] DOB: Jul 10, 1951 DOA: 08/21/2019 PCP: Owens Loffler, MD    Brief Narrative:  Kristy [name] is a 68 y.o. female with medical history significant of nonischemic cardiomyopathy, LBBB, systolic congestive heart failure, Crohn's disease, chronic kidney disease stage III, type 2 diabetes with hx of DKA, hypertension who presents with altered mental status.  Patient is unable to provide history as she is confused and ill-appearing. Patient reportedly was brought in by EMS from home for altered mental status and increased blood glucose.  Blood glucose with EMS in the 600s and improved down to 331 post 500 cc normal saline with EMS.  She reportedly was actively vomiting at the time of EMS arrival.  She was noted by nursing to be alert and oriented only to self in the ED and was disoriented and pulling on her leads and IV lines. She was recently discharged on 08/13/2019 with DKA and reportedly was non-compliant with her medications.   ED Course: Patient was afebrile and normotensive but tachycardic up to 130s and intermittently tachypneic up to 28 and had to be placed on 2 L via nasal cannula. CBC shows leukocytosis of 18.7, hemoglobin of 15.2 CMP showed glucose of 1434, BUN of 46, creatinine of 2.17, anion gap of 26.Troponin of 18 and 22.  Initial lactate of 7.6 with improvement down to 4.1 after fluid resuscitation. PH of 7.28, Bicarb of 23.  Chest x-ray shows mild bronchitic thick changes. She was given 3L of NS bolus and started on vancomycin and cefepime for broad-spectrum antibiotic coverage with no clear source of infection.  Interim History: - 11/20: CT-head negative - 11/20: d/c'ed insulin gtt after blood sugar improved, started lantus and SSI - 11/22: Mental status has not come back to baseline.  Blood sugar still fluctuating. - 11/22: started diet, started heart/carb diet  Subjective:  Patient is still confused, oriented to  person, not place and time.  Denies chest pain, abdominal pain.  No active nausea vomiting, diarrhea noted.   Assessment & Plan:   Principal Problem:   Hyperosmolar hyperglycemic state (HHS) ([place]) Active Problems:   Uncontrolled type 2 diabetes mellitus with stage 3 chronic kidney disease, with long-term current use of insulin (HCC)   Essential hypertension   LBBB (left bundle branch block)   Coronary artery disease, non-occlusive   Chronic systolic CHF (congestive heart failure) (HCC)   NICM (nonischemic cardiomyopathy) (HCC)   Elevated troponin   Hypernatremia   Sepsis ([place])   Acute renal failure superimposed on stage 3a chronic kidney disease ([place])   Acute metabolic encephalopathy   Hyperosmolar hyperglycemic state with coma (HHS) ([place]): this is due to medication noncompliance. Mental status improved compired to the time on admission, but still not back to baseline. 11/20: d/c'ed insulin gtt after blood sugar improved, started lantus and SSI - Lantus 20 unit bid  - SSI -resistant  Acute metabolic encephalopathy: improving, but not back to baseline yet. CT-head negative. -Frequent neuro check -started diet, started heart/carb diet  Sepsis: Patient meets criteria for sepsis with leukocytosis, tachycardia, tachypnea.  Etiology is not clear.  Patient has elevated lactic acid 7.6, 4.1, 2.6, 3.2,  Chest x-ray showed mild bronchitis change.  Urinalysis negative.  COVID-19 negative.  Leukocytosis improved.  -Continue vancomycin and cefepime --> may narrow  11/23 -Follow-up of blood culture -IVF: 3L of NS bolus in ED, followed by 75 cc/h of 1/2 NS --> stopped IVF 11/22  CBC Latest Ref Rng &  Units 08/24/2019 08/23/2019 08/21/2019  WBC 4.0 - 10.5 K/uL 9.6 16.0(H) 18.7(H)  Hemoglobin 12.0 - 15.0 g/dL 11.7(L) 13.8 15.2(H)  Hematocrit 36.0 - 46.0 % 35.8(L) 43.7 53.0(H)  Platelets 150 - 400 K/uL 142(L) 199 319    Acute on chronic kidney disease stage IIIa: Baseline creatinine 1.0-1.4.   Her creatinine was 1.61 on admission, improved. - follow serial BMP   Lab Results  Component Value Date   CREATININE 0.80 08/24/2019   CREATININE 1.30 (H) 08/22/2019   CREATININE 1.32 (H) 08/22/2019    Suspected coffee-ground emesis: Hgb stable 15.2 -->13.8 -->11.7. no active hematemesis any more.. Seen by GI in recent admission last week and they did not recommend procedure and to take Protonix for a month  -on Protonix -hold ASA and plavis -check FOBT  Chronic systolic congestive heart failure with EF 35 to 40%. BNP 207 -->303. No signs of acute exacerbation -Coreg  -hold Aldactone, lasix and Entresto for AoCKD-IIIa --> may restart on 11/23   Uncontrolled type 2 diabetes mellitus with stage 3 chronic kidney disease, with long-term current use of insulin ([place]): A1c 10.9 on 07/08/19.  - Lantus 15 unit bid -->increased to 18 units bid. - SSI  Essential hypertension:  -start coreg 11/22 -hold lasix and [place]  Coronary artery disease, non-occlusive and elevated trop: Trop 19 -->22. Likely due to demand ischemia -Hold aspirin and Plavix due to possible coffee-ground emesis.  Hypernatremia: Na 162 --> 159 -->145 -f/u by BMP   DVT prophylaxis: SCD Code Status: full Family Communication:  Called her daughter by phone Discharge plan: Patient mental status improves, but has not come back to baseline.  Blood sugar is still fluctuates.  Not ready for discharge.   Consultants:   none  Procedures:  none  Antimicrobials:  Anti-infectives (From admission, onward)   Start     Dose/Rate Route Frequency Ordered Stop   08/22/19 2200  vancomycin (VANCOCIN) IVPB 750 mg/150 ml premix     750 mg 150 mL/hr over 60 Minutes Intravenous Every 24 hours 08/22/19 0701     08/22/19 0900  ceFEPIme (MAXIPIME) 2 g in sodium chloride 0.9 % 100 mL IVPB     2 g 200 mL/hr over 30 Minutes Intravenous Every 12 hours 08/22/19 0701     08/22/19 0140  vancomycin variable dose per unstable renal  function (pharmacist dosing)  Status:  Discontinued      Does not apply See admin instructions 08/22/19 0140 08/22/19 0925   08/21/19 2000  vancomycin (VANCOCIN) 1,250 mg in sodium chloride 0.9 % 250 mL IVPB     1,250 mg 166.7 mL/hr over 90 Minutes Intravenous  Once 08/21/19 1943 08/22/19 0156   08/21/19 1945  ceFEPIme (MAXIPIME) 2 g in sodium chloride 0.9 % 100 mL IVPB     2 g 200 mL/hr over 30 Minutes Intravenous  Once 08/21/19 1940 08/21/19 2153   08/21/19 1945  vancomycin (VANCOCIN) IVPB 1000 mg/200 mL premix  Status:  Discontinued     1,000 mg 200 mL/hr over 60 Minutes Intravenous  Once 08/21/19 1940 08/21/19 1943       Objective: Vitals:   08/24/19 1200 08/24/19 1300 08/24/19 1500 08/24/19 1600  BP: (!) 116/92   131/64  Pulse: 87 84 84 87  Resp: 15 19 18 18   Temp:      TempSrc:      SpO2: 98% 93% 93% 96%  Weight:      Height:        Intake/Output Summary (Last  24 hours) at 08/24/2019 1819 Last data filed at 08/24/2019 1600 Gross per 24 hour  Intake 1680 ml  Output 1125 ml  Net 555 ml   Filed Weights   08/21/19 1709  Weight: 68 kg    Examination:  Physical Exam: Dry mucous membrane  General: Not in acute distress HEENT: PERRL, EOMI, no scleral icterus, No JVD or bruit Cardiac: S1/S2, RRR, No murmurs, gallops or rubs Pulm:  No rales, wheezing, rhonchi or rubs. Abd: Soft, nondistended, nontender,  no organomegaly, BS present Ext: No edema. 1+DP/PT pulse bilaterally Musculoskeletal: No joint deformities, erythema, or stiffness, ROM full Skin: No rashes.  Neuro: Patient is alert, orientated to person, but not to time.  She moves all extremities normally. Psych: Patient is not psychotic, no suicidal or hemocidal ideation.    Data Reviewed: I have personally reviewed following labs and imaging studies  CBC: Recent Labs  Lab 08/21/19 1701 08/23/19 0551 08/24/19 0449  WBC 18.7* 16.0* 9.6  NEUTROABS 14.8*  --   --   HGB 15.2* 13.8 11.7*  HCT 53.0* 43.7  35.8*  MCV 100.6* 92.0 88.8  PLT 319 199 017*   Basic Metabolic Panel: Recent Labs  Lab 08/22/19 0445 08/22/19 0809 08/22/19 1252 08/22/19 1622 08/23/19 0551 08/24/19 0449 08/24/19 1710  NA 160* 161* 162* 159*  --  145  --   K 3.2* 3.2* 4.4 3.6  --  3.2*  --   CL 121* 121* 122* 122*  --  115*  --   CO2 27 28 29 28   --  20*  --   GLUCOSE 487* 272* 181* 198*  --  127* 425*  BUN 39* 39* 41* 40*  --  28*  --   CREATININE 1.33* 1.32* 1.32* 1.30*  --  0.80  --   CALCIUM 10.2 9.9 10.5* 9.5  --  8.3*  --   MG  --   --   --   --  2.1 1.7  --    GFR: Estimated Creatinine Clearance: 63.8 mL/min (by C-G formula based on SCr of 0.8 mg/dL). Liver Function Tests: Recent Labs  Lab 08/21/19 1701  AST 19  ALT 26  ALKPHOS 245*  BILITOT 1.3*  PROT 8.3*  ALBUMIN 3.5   No results for input(s): LIPASE, AMYLASE in the last 168 hours. No results for input(s): AMMONIA in the last 168 hours. Coagulation Profile: No results for input(s): INR, PROTIME in the last 168 hours. Cardiac Enzymes: No results for input(s): CKTOTAL, CKMB, CKMBINDEX, TROPONINI in the last 168 hours. BNP (last 3 results) No results for input(s): PROBNP in the last 8760 hours. HbA1C: No results for input(s): HGBA1C in the last 72 hours. CBG: Recent Labs  Lab 08/24/19 0024 08/24/19 0406 08/24/19 0750 08/24/19 1132 08/24/19 1609  GLUCAP 221* 111* 139* 318* 449*   Lipid Profile: No results for input(s): CHOL, HDL, LDLCALC, TRIG, CHOLHDL, LDLDIRECT in the last 72 hours. Thyroid Function Tests: No results for input(s): TSH, T4TOTAL, FREET4, T3FREE, THYROIDAB in the last 72 hours. Anemia Panel: No results for input(s): VITAMINB12, FOLATE, FERRITIN, TIBC, IRON, RETICCTPCT in the last 72 hours. Sepsis Labs: Recent Labs  Lab 08/21/19 1930 08/22/19 0207 08/22/19 0809 08/22/19 1020  LATICACIDVEN 4.1* 3.6* 2.6* 3.2*    Recent Results (from the past 240 hour(s))  SARS Coronavirus 2 by RT PCR (hospital order,  performed in [facility] hospital lab) Nasopharyngeal Nasopharyngeal Swab     Status: None   Collection Time: 08/21/19  8:05 PM  Specimen: Nasopharyngeal Swab  Result Value Ref Range Status   SARS Coronavirus 2 NEGATIVE NEGATIVE Final    Comment: (NOTE) If result is NEGATIVE SARS-CoV-2 target nucleic acids are NOT DETECTED. The SARS-CoV-2 RNA is generally detectable in upper and lower  respiratory specimens during the acute phase of infection. The lowest  concentration of SARS-CoV-2 viral copies this assay can detect is 250  copies / mL. A negative result does not preclude SARS-CoV-2 infection  and should not be used as the sole basis for treatment or other  patient management decisions.  A negative result may occur with  improper specimen collection / handling, submission of specimen other  than nasopharyngeal swab, presence of viral mutation(s) within the  areas targeted by this assay, and inadequate number of viral copies  (<250 copies / mL). A negative result must be combined with clinical  observations, patient history, and epidemiological information. If result is POSITIVE SARS-CoV-2 target nucleic acids are DETECTED. The SARS-CoV-2 RNA is generally detectable in upper and lower  respiratory specimens dur ing the acute phase of infection.  Positive  results are indicative of active infection with SARS-CoV-2.  Clinical  correlation with patient history and other diagnostic information is  necessary to determine patient infection status.  Positive results do  not rule out bacterial infection or co-infection with other viruses. If result is PRESUMPTIVE POSTIVE SARS-CoV-2 nucleic acids MAY BE PRESENT.   A presumptive positive result was obtained on the submitted specimen  and confirmed on repeat testing.  While 2019 novel coronavirus  (SARS-CoV-2) nucleic acids may be present in the submitted sample  additional confirmatory testing may be necessary for epidemiological  and / or  clinical management purposes  to differentiate between  SARS-CoV-2 and other Sarbecovirus currently known to infect humans.  If clinically indicated additional testing with an alternate test  methodology [phone]) is advised. The SARS-CoV-2 RNA is generally  detectable in upper and lower respiratory sp ecimens during the acute  phase of infection. The expected result is Negative. Fact Sheet for Patients:  StrictlyIdeas.no Fact Sheet for Healthcare Providers: BankingDealers.co.za This test is not yet approved or cleared by the [place] FDA and has been authorized for detection and/or diagnosis of SARS-CoV-2 by FDA under an Emergency Use Authorization (EUA).  This EUA will remain in effect (meaning this test can be used) for the duration of the COVID-19 declaration under Section 564(b)(1) of the Act, 21 U.S.C. section 360bbb-3(b)(1), unless the authorization is terminated or revoked sooner. Performed at [facility], [address] [place], [place] [address]   Blood Culture (routine x 2)     Status: None (Preliminary result)   Collection Time: 08/21/19  8:17 PM   Specimen: BLOOD  Result Value Ref Range Status   Specimen Description BLOOD BLOOD LEFT HAND  Final   Special Requests   Final    BOTTLES DRAWN AEROBIC AND ANAEROBIC Blood Culture adequate volume   Culture   Final    NO GROWTH 3 DAYS Performed at [facility], [address] [place], [place] [address]    Report Status PENDING  Incomplete  Blood Culture (routine x 2)     Status: None (Preliminary result)   Collection Time: 08/21/19  8:18 PM   Specimen: BLOOD  Result Value Ref Range Status   Specimen Description BLOOD RIGHT ANTECUBITAL  Final   Special Requests   Final    BOTTLES DRAWN AEROBIC AND ANAEROBIC Blood Culture adequate volume   Culture  Final    NO GROWTH 3 DAYS Performed at [facility], [address] [place], [place]  [address]    Report Status PENDING  Incomplete    Radiology Studies: No results found.      Scheduled Meds: . aspirin  81 mg Oral Daily  . carvedilol  12.5 mg Oral BID  . Chlorhexidine Gluconate Cloth  6 each Topical Daily  . [START ON 08/25/2019] clopidogrel  75 mg Oral Q breakfast  . insulin aspart  0-20 Units Subcutaneous Q4H  . insulin glargine  20 Units Subcutaneous BID  . nystatin ointment   Topical BID  . pantoprazole  40 mg Oral Daily   Continuous Infusions: . ceFEPime (MAXIPIME) IV 2 g (08/24/19 0828)  . vancomycin 750 mg (08/23/19 2242)     LOS: 2 days    Time spent: 66 min     Ivor Costa, DO [facility] PAGER is on [place]  If 7PM-7AM, please contact night-coverage [email or web address] Password [facility] 08/24/2019, 6:19 PM

## 2019-08-25 ENCOUNTER — Ambulatory Visit: Payer: Medicare HMO | Admitting: Physician Assistant

## 2019-08-25 ENCOUNTER — Encounter: Payer: Medicare HMO | Admitting: Family Medicine

## 2019-08-25 DIAGNOSIS — I2510 Atherosclerotic heart disease of native coronary artery without angina pectoris: Secondary | ICD-10-CM

## 2019-08-25 LAB — GLUCOSE, CAPILLARY
Glucose-Capillary: 114 mg/dL — ABNORMAL HIGH (ref 70–99)
Glucose-Capillary: 223 mg/dL — ABNORMAL HIGH (ref 70–99)
Glucose-Capillary: 243 mg/dL — ABNORMAL HIGH (ref 70–99)
Glucose-Capillary: 249 mg/dL — ABNORMAL HIGH (ref 70–99)
Glucose-Capillary: 322 mg/dL — ABNORMAL HIGH (ref 70–99)
Glucose-Capillary: 469 mg/dL — ABNORMAL HIGH (ref 70–99)
Glucose-Capillary: 61 mg/dL — ABNORMAL LOW (ref 70–99)
Glucose-Capillary: 62 mg/dL — ABNORMAL LOW (ref 70–99)
Glucose-Capillary: 65 mg/dL — ABNORMAL LOW (ref 70–99)

## 2019-08-25 LAB — BASIC METABOLIC PANEL
Anion gap: 9 (ref 5–15)
BUN: 20 mg/dL (ref 8–23)
CO2: 20 mmol/L — ABNORMAL LOW (ref 22–32)
Calcium: 8.6 mg/dL — ABNORMAL LOW (ref 8.9–10.3)
Chloride: 112 mmol/L — ABNORMAL HIGH (ref 98–111)
Creatinine, Ser: 0.73 mg/dL (ref 0.44–1.00)
GFR calc Af Amer: >60 mL/min (ref >60)
GFR calc non Af Amer: >60 mL/min (ref >60)
Glucose, Bld: 174 mg/dL — ABNORMAL HIGH (ref 70–99)
Potassium: 3.8 mmol/L (ref 3.5–5.1)
Sodium: 141 mmol/L (ref 135–145)

## 2019-08-25 LAB — CBC
HCT: 42 % (ref 36.0–46.0)
Hemoglobin: 13.2 g/dL (ref 12.0–15.0)
MCH: 28.4 pg (ref 26.0–34.0)
MCHC: 31.4 g/dL (ref 30.0–36.0)
MCV: 90.5 fL (ref 80.0–100.0)
Platelets: 119 10*3/uL — ABNORMAL LOW (ref 150–400)
RBC: 4.64 MIL/uL (ref 3.87–5.11)
RDW: 13.1 % (ref 11.5–15.5)
WBC: 9.9 10*3/uL (ref 4.0–10.5)
nRBC: 0 % (ref 0.0–0.2)

## 2019-08-25 LAB — GLUCOSE, RANDOM: Glucose, Bld: 485 mg/dL — ABNORMAL HIGH (ref 70–99)

## 2019-08-25 LAB — LACTIC ACID, PLASMA: Lactic Acid, Venous: 1.2 mmol/L (ref 0.5–1.9)

## 2019-08-25 LAB — MAGNESIUM: Magnesium: 1.8 mg/dL (ref 1.7–2.4)

## 2019-08-25 LAB — PROCALCITONIN: Procalcitonin: <0.1 ng/mL

## 2019-08-25 LAB — LACTATE DEHYDROGENASE: LDH: 170 U/L (ref 98–192)

## 2019-08-25 MED ORDER — INSULIN ASPART 100 UNIT/ML ~~LOC~~ SOLN
0.0000 [IU] | Freq: Every day | SUBCUTANEOUS | Status: DC
  Administered 2019-08-25: 4 [IU] via SUBCUTANEOUS
  Filled 2019-08-25: qty 1

## 2019-08-25 MED ORDER — INSULIN ASPART 100 UNIT/ML ~~LOC~~ SOLN
0.0000 [IU] | Freq: Three times a day (TID) | SUBCUTANEOUS | Status: DC
  Administered 2019-08-25: 7 [IU] via SUBCUTANEOUS
  Filled 2019-08-25: qty 1

## 2019-08-25 MED ORDER — HEPARIN SODIUM (PORCINE) 5000 UNIT/ML IJ SOLN: 5000.0000 [IU] | Freq: Three times a day (TID) | INTRAMUSCULAR | Status: DC

## 2019-08-25 MED ORDER — VANCOMYCIN HCL 1.25 G IV SOLR
1250.0000 mg | INTRAVENOUS | Status: DC
  Filled 2019-08-25: qty 1250

## 2019-08-25 MED ORDER — INSULIN ASPART 100 UNIT/ML ~~LOC~~ SOLN
15.0000 [IU] | Freq: Once | SUBCUTANEOUS | Status: AC
  Administered 2019-08-25: 12:00:00 15 [IU] via SUBCUTANEOUS
  Filled 2019-08-25: qty 1

## 2019-08-25 NOTE — Progress Notes (Addendum)
Patient very paranoid. States family hated her and is trying to get her house and money.Keeps asking when her next meal is. Definitely has memory issues. She is alert but not oriented to reality. Can repeat necessary issues but ask inappropriate questions. Has no concept of time. Pulled out second IV.

## 2019-08-25 NOTE — TOC Initial Note (Addendum)
Transition of Care [facility]) - Initial/Assessment Note    Patient Details  Name: Kristy [name] MRN: [id number] Date of Birth: Jul 04, 1951  Transition of Care [facility]) CM/SW Contact:    Candie Chroman, LCSW Phone Number: 08/25/2019, 2:54 PM  Clinical Narrative: Received call from patient's APS social worker, Magda Paganini. Patient's stepdaughter is filing for guardianship today. Patient's niece will continue to act as HCPOA until guardianship has been obtained. If patient unable to go to [facility], family wants patient placed in an ALF and patient is able to private pay for this. CSW spoke with [employer]. Will go ahead and send over referral today.               4:47 pm: Received call from patient's stepdaughter. Discussed SNF vs. ALF. ALF preferences are [facility], [facility], and [facility]. CSW emailed her list of all ALF's in [place].     Expected Discharge Plan: Skilled Nursing Facility Barriers to Discharge: Ship broker, Continued Medical Work up   Patient Goals and CMS Choice        Expected Discharge Plan and Services Expected Discharge Plan: [place] Choice: [place] Living arrangements for the past 2 months: Single Family Home                                      Prior Living Arrangements/Services Living arrangements for the past 2 months: Single Family Home Lives with:: Self Patient language and need for interpreter reviewed:: Yes        Need for Family Participation in Patient Care: Yes (Comment) Care giver support system in place?: Yes (comment)   Criminal Activity/Legal Involvement Pertinent to Current Situation/Hospitalization: No - Comment as needed  Activities of Daily Living      Permission Sought/Granted                  Emotional Assessment Appearance:: Appears stated age Attitude/Demeanor/Rapport: Unable to Assess Affect (typically  observed): Unable to Assess Orientation: : Oriented to Self Alcohol / Substance Use: Not Applicable Psych Involvement: No (comment)  Admission diagnosis:  Hyperosmolar hyperglycemic state (HHS) ([place]) [E11.00, E11.65] Patient Active Problem List   Diagnosis Date Noted  . Hyperosmolar hyperglycemic state (HHS) ([place]) 08/22/2019  . Sepsis ([place]) 08/22/2019  . Acute renal failure superimposed on stage 3a chronic kidney disease ([place]) 08/22/2019  . Acute metabolic encephalopathy 31/49/7026  . Hypokalemia   . Hypomagnesemia   . Encounter for competency evaluation   . Weakness   . Hypotension   . Hypernatremia   . Elevated troponin   . Sepsis secondary to UTI ([place])   . DKA (diabetic ketoacidoses) ([place]) 04/19/2019  . NICM (nonischemic cardiomyopathy) ([place]) 11/08/2018  . Chronic systolic CHF (congestive heart failure) ([place]) 08/22/2018  . AKI (acute kidney injury) ([place]) 05/13/2018  . Coronary artery disease, non-occlusive 09/13/2016  . Primary osteoarthritis involving multiple joints 03/14/2015  . Personal history of other malignant neoplasm of skin 12/01/2014  . Crohn's disease ([place]) 10/13/2013  . Major depressive disorder, recurrent, in full remission ([place]) 05/01/2011  . TRANSAMINASES, SERUM, ELEVATED 05/01/2010  . PSORIASIS 08/02/2009  . Hyperlipidemia 05/11/2009  . GOUT 02/05/2009  . Uncontrolled type 2 diabetes mellitus with stage 3 chronic kidney disease, with long-term current use of insulin ([place])   . Essential hypertension 04/18/2007  . LBBB (left bundle branch block)  04/18/2007  . ALLERGIC RHINITIS 04/18/2007   PCP:  Owens Loffler, MD Pharmacy:   [facility], [address] [place] [address] Phone: [phone] Fax: [phone]  [place], [place] - [address] [address] Phone: [phone] Fax: [phone]  [facility] [place], [place] - [address] [place] [address] Phone: [phone] Fax: [phone]     Social Determinants of Health ([place]) Interventions    Readmission Risk Interventions Readmission Risk Prevention Plan 04/28/2019 04/27/2019  Post Dischage Appt - Complete  Medication Screening - Complete  Transportation Screening - Complete  PCP or Specialist Appt within 5-7 Days Complete -  Home Care Screening Complete -  Medication Review (RN CM) Complete -  Some recent data might be hidden

## 2019-08-25 NOTE — Progress Notes (Signed)
Pharmacy Antibiotic Note  Kristy [name] is a 68 y.o. female admitted on 08/21/2019 with HHS. Patient being treated for presumed sepsis. Plan is to potentially narrow therapy on 11/23.   11/23: Procalcitonin was negative, WBCs trending downward, afebrile > 24 hours. The decision was made with the MD to discontinue all antibiotics for this patient.  Plan: - Discontinue vancomycin. - Discontinue cefepime.  Pharmacy will complete the consult at this time.  Height: 5' 4"  (162.6 cm) Weight: 150 lb (68 kg) IBW/kg (Calculated) : 54.7  Temp (24hrs), Avg:98.1 F (36.7 C), Min:98.1 F (36.7 C), Max:98.1 F (36.7 C)  Recent Labs  Lab 08/21/19 1701  08/21/19 1930  08/22/19 0207  08/22/19 0809 08/22/19 1020 08/22/19 1252 08/22/19 1622 08/23/19 0551 08/24/19 0449 08/25/19 0651  WBC 18.7*  --   --   --   --   --   --   --   --   --  16.0* 9.6 9.9  CREATININE 2.17*  --   --    < >  --    < > 1.32*  --  1.32* 1.30*  --  0.80 0.73  LATICACIDVEN  --    < > 4.1*  --  3.6*  --  2.6* 3.2*  --   --   --   --  1.2   < > = values in this interval not displayed.    Estimated Creatinine Clearance: 63.8 mL/min (by C-G formula based on SCr of 0.73 mg/dL).    Allergies  Allergen Reactions  . Fish Oil Other (See Comments)    Gout  . Glimepiride Other (See Comments)    REACTION: hypoglycemia  . Guanfacine Hcl Other (See Comments)    REACTION: unspecified  . Rosiglitazone Other (See Comments)    CHF   Antibiotics this admission: Cefepime 11/19 >> 11/23 Vancomycin 11/19 >> 11/23  Microbiology:  11/19 Blood Cultures: no growth x 3 days 11/19 SARS Coronavirus: negative  Thank you for allowing pharmacy to be a part of this patient's care.  Raiford Simmonds, PharmD Candidate 08/25/2019 11:54 AM

## 2019-08-25 NOTE — Progress Notes (Signed)
PROGRESS NOTE    Kristy [name]  HUT:[id number] DOB: [birth year] DOA: 08/21/2019 PCP: Owens Loffler, MD    Brief Narrative:  Kristy [name] is a 68 y.o. female with medical history significant of nonischemic cardiomyopathy, LBBB, systolic congestive heart failure, Crohn's disease, chronic kidney disease stage III, type 2 diabetes with hx of DKA, hypertension who presents with altered mental status.  Patient is unable to provide history as she is confused and ill-appearing. Patient reportedly was brought in by EMS from home for altered mental status and increased blood glucose.  Blood glucose with EMS in the 600s and improved down to 331 post 500 cc normal saline with EMS.  She reportedly was actively vomiting at the time of EMS arrival.  She was noted by nursing to be alert and oriented only to self in the ED and was disoriented and pulling on her leads and IV lines. She was recently discharged on 08/13/2019 with DKA and reportedly was non-compliant with her medications.   ED Course: Patient was afebrile and normotensive but tachycardic up to 130s and intermittently tachypneic up to 28 and had to be placed on 2 L via nasal cannula. CBC shows leukocytosis of 18.7, hemoglobin of 15.2 CMP showed glucose of 1434, BUN of 46, creatinine of 2.17, anion gap of 26.Troponin of 18 and 22.  Initial lactate of 7.6 with improvement down to 4.1 after fluid resuscitation. PH of 7.28, Bicarb of 23.  Chest x-ray shows mild bronchitic thick changes. She was given 3L of NS bolus and started on vancomycin and cefepime for broad-spectrum antibiotic coverage with no clear source of infection.  Interim History: - 11/20: CT-head negative - 11/20: d/c'ed insulin gtt after blood sugar improved, started lantus and SSI - 11/22: Mental status has not come back to baseline.  Blood sugar still fluctuating. - 11/22: started diet, started heart/carb diet - 11/23: Procalcitonin < 0.10 and lactic acid 1.2.  No  leukocytosis.  Antibiotics was discontinued.  Subjective:  Patient is still confused, oriented to person, not place and time.  Denies chest pain, abdominal pain.  No active nausea vomiting, diarrhea noted.   Assessment & Plan:   Principal Problem:   Hyperosmolar hyperglycemic state (HHS) ([place]) Active Problems:   Uncontrolled type 2 diabetes mellitus with stage 3 chronic kidney disease, with long-term current use of insulin (HCC)   Essential hypertension   LBBB (left bundle branch block)   Coronary artery disease, non-occlusive   Chronic systolic CHF (congestive heart failure) (HCC)   NICM (nonischemic cardiomyopathy) (HCC)   Elevated troponin   Hypernatremia   Sepsis ([place])   Acute renal failure superimposed on stage 3a chronic kidney disease ([place])   Acute metabolic encephalopathy   Hyperosmolar hyperglycemic state with coma (HHS) ([place]): this is due to medication noncompliance. Mental status improved compired to the time on admission, but still not back to baseline. 11/20: d/c'ed insulin gtt after blood sugar improved, started lantus and SSI. Per her daughter, pt often skips her medication. She has hx of depression, family asked for psych evaluation - Lantus 20 unit bid  - SSI -resistant - will consult psychiatry  Depression:  Per her daughter, pt often skips her medication. She has hx of depression, family asked for psych evaluation -prozac  Acute metabolic encephalopathy: improving, but not back to baseline yet. CT-head negative. -Frequent neuro check -started diet, started heart/carb diet  Sepsis: Patient meets criteria for sepsis with leukocytosis, tachycardia, tachypnea.  Etiology is not clear.  Patient has  elevated lactic acid 7.6, 4.1, 2.6, 3.2,  Chest x-ray showed mild bronchitis change.  Urinalysis negative.  COVID-19 negative.  Leukocytosis resolved.  11/23: Procalcitonin < 0.10 and lactic acid 1.2.  No leukocytosis.    -Continue vancomycin and cefepime --> may narrow   11/23 -->Antibiotics was discontinued 11/23 -Follow-up of blood culture  CBC Latest Ref Rng & Units 08/25/2019 08/24/2019 08/23/2019  WBC 4.0 - 10.5 K/uL 9.9 9.6 16.0(H)  Hemoglobin 12.0 - 15.0 g/dL 13.2 11.7(L) 13.8  Hematocrit 36.0 - 46.0 % 42.0 35.8(L) 43.7  Platelets 150 - 400 K/uL 119(L) 142(L) 199    Acute on chronic kidney disease stage IIIa: Baseline creatinine 1.0-1.4.  Her creatinine was 1.61 on admission, improved,. Cre 0.73 - follow serial BMP   Lab Results  Component Value Date   CREATININE 0.73 08/25/2019   CREATININE 0.80 08/24/2019   CREATININE 1.30 (H) 08/22/2019    Suspected coffee-ground emesis: Hgb stable 15.2 -->13.8 -->11.7 -->13.2. no active hematemesis any more.. Seen by GI in recent admission last week and they did not recommend procedure and to take Protonix for a month  -on Protonix -hold ASA and plavis -check FOBT  Chronic systolic congestive heart failure with EF 35 to 40%. BNP 207 -->303. No signs of acute exacerbation -Coreg  -hold Aldactone, lasix and Entresto for AoCKD-IIIa --> may restart on 11/24   Uncontrolled type 2 diabetes mellitus with stage 3 chronic kidney disease, with long-term current use of insulin ([place]): A1c 10.9 on 07/08/19.  - Lantus 20 unit bid  - SSI-resistant  Essential hypertension:  -started coreg 11/22 -hold lasix and [place]  Coronary artery disease, non-occlusive and elevated trop: Trop 19 -->22. Likely due to demand ischemia -Hold aspirin and Plavix due to possible coffee-ground emesis.  Hypernatremia: Na 162 --> 159 -->145 -->141 -f/u by BMP   DVT prophylaxis: SCD Code Status: full Family Communication:  Not today Discharge plan: Patient mental status improves, but has not come back to baseline.  Blood sugar is still fluctuates.  Not ready for discharge.   Consultants:   none  Procedures:  none  Antimicrobials:  Anti-infectives (From admission, onward)   Start     Dose/Rate Route Frequency  Ordered Stop   08/25/19 2200  vancomycin (VANCOCIN) 1,250 mg in sodium chloride 0.9 % 250 mL IVPB  Status:  Discontinued     1,250 mg 166.7 mL/hr over 90 Minutes Intravenous Every 24 hours 08/25/19 1007 08/25/19 1400   08/24/19 2200  vancomycin (VANCOCIN) 1,250 mg in sodium chloride 0.9 % 250 mL IVPB  Status:  Discontinued     1,250 mg 166.7 mL/hr over 90 Minutes Intravenous Every 24 hours 08/24/19 2000 08/25/19 1007   08/22/19 2200  vancomycin (VANCOCIN) IVPB 750 mg/150 ml premix  Status:  Discontinued     750 mg 150 mL/hr over 60 Minutes Intravenous Every 24 hours 08/22/19 0701 08/24/19 2000   08/22/19 0900  ceFEPIme (MAXIPIME) 2 g in sodium chloride 0.9 % 100 mL IVPB  Status:  Discontinued     2 g 200 mL/hr over 30 Minutes Intravenous Every 12 hours 08/22/19 0701 08/25/19 1400   08/22/19 0140  vancomycin variable dose per unstable renal function (pharmacist dosing)  Status:  Discontinued      Does not apply See admin instructions 08/22/19 0140 08/22/19 0925   08/21/19 2000  vancomycin (VANCOCIN) 1,250 mg in sodium chloride 0.9 % 250 mL IVPB     1,250 mg 166.7 mL/hr over 90 Minutes Intravenous  Once 08/21/19  1943 08/22/19 0156   08/21/19 1945  ceFEPIme (MAXIPIME) 2 g in sodium chloride 0.9 % 100 mL IVPB     2 g 200 mL/hr over 30 Minutes Intravenous  Once 08/21/19 1940 08/21/19 2153   08/21/19 1945  vancomycin (VANCOCIN) IVPB 1000 mg/200 mL premix  Status:  Discontinued     1,000 mg 200 mL/hr over 60 Minutes Intravenous  Once 08/21/19 1940 08/21/19 1943       Objective: Vitals:   08/25/19 1300 08/25/19 1400 08/25/19 1500 08/25/19 1606  BP: 114/65 113/71 130/78 107/60  Pulse: 90 86 95 93  Resp: 16 19 16 20   Temp:    98.7 F (37.1 C)  TempSrc:    Oral  SpO2: 96% 95% 96% 98%  Weight:      Height:        Intake/Output Summary (Last 24 hours) at 08/25/2019 1701 Last data filed at 08/25/2019 1000 Gross per 24 hour  Intake 360 ml  Output 1200 ml  Net -840 ml   Filed Weights    08/21/19 1709  Weight: 68 kg    Examination:  Physical Exam: Dry mucous membrane  General: Not in acute distress HEENT: PERRL, EOMI, no scleral icterus, No JVD or bruit Cardiac: S1/S2, RRR, No murmurs, gallops or rubs Pulm:  No rales, wheezing, rhonchi or rubs. Abd: Soft, nondistended, nontender,  no organomegaly, BS present Ext: No edema. 1+DP/PT pulse bilaterally Musculoskeletal: No joint deformities, erythema, or stiffness, ROM full Skin: No rashes.  Neuro: Patient is alert, orientated to person, but not to time.  She moves all extremities normally. Psych: Patient is not psychotic, no suicidal or hemocidal ideation.    Data Reviewed: I have personally reviewed following labs and imaging studies  CBC: Recent Labs  Lab 08/21/19 1701 08/23/19 0551 08/24/19 0449 08/25/19 0651  WBC 18.7* 16.0* 9.6 9.9  NEUTROABS 14.8*  --   --   --   HGB 15.2* 13.8 11.7* 13.2  HCT 53.0* 43.7 35.8* 42.0  MCV 100.6* 92.0 88.8 90.5  PLT 319 199 142* 465*   Basic Metabolic Panel: Recent Labs  Lab 08/22/19 0809 08/22/19 1252 08/22/19 1622 08/23/19 0551 08/24/19 0449 08/24/19 1710 08/25/19 0651 08/25/19 1216  NA 161* 162* 159*  --  145  --  141  --   K 3.2* 4.4 3.6  --  3.2*  --  3.8  --   CL 121* 122* 122*  --  115*  --  112*  --   CO2 28 29 28   --  20*  --  20*  --   GLUCOSE 272* 181* 198*  --  127* 425* 174* 485*  BUN 39* 41* 40*  --  28*  --  20  --   CREATININE 1.32* 1.32* 1.30*  --  0.80  --  0.73  --   CALCIUM 9.9 10.5* 9.5  --  8.3*  --  8.6*  --   MG  --   --   --  2.1 1.7  --  1.8  --    GFR: Estimated Creatinine Clearance: 63.8 mL/min (by C-G formula based on SCr of 0.73 mg/dL). Liver Function Tests: Recent Labs  Lab 08/21/19 1701  AST 19  ALT 26  ALKPHOS 245*  BILITOT 1.3*  PROT 8.3*  ALBUMIN 3.5   No results for input(s): LIPASE, AMYLASE in the last 168 hours. No results for input(s): AMMONIA in the last 168 hours. Coagulation Profile: No results for  input(s): INR, PROTIME in  the last 168 hours. Cardiac Enzymes: No results for input(s): CKTOTAL, CKMB, CKMBINDEX, TROPONINI in the last 168 hours. BNP (last 3 results) No results for input(s): PROBNP in the last 8760 hours. HbA1C: No results for input(s): HGBA1C in the last 72 hours. CBG: Recent Labs  Lab 08/25/19 0542 08/25/19 0739 08/25/19 1129 08/25/19 1130 08/25/19 1628  GLUCAP 114* 249* 510* 469* 243*   Lipid Profile: No results for input(s): CHOL, HDL, LDLCALC, TRIG, CHOLHDL, LDLDIRECT in the last 72 hours. Thyroid Function Tests: No results for input(s): TSH, T4TOTAL, FREET4, T3FREE, THYROIDAB in the last 72 hours. Anemia Panel: No results for input(s): VITAMINB12, FOLATE, FERRITIN, TIBC, IRON, RETICCTPCT in the last 72 hours. Sepsis Labs: Recent Labs  Lab 08/22/19 0207 08/22/19 0809 08/22/19 1020 08/25/19 0651 08/25/19 0909  PROCALCITON  --   --   --   --  <0.10  LATICACIDVEN 3.6* 2.6* 3.2* 1.2  --     Recent Results (from the past 240 hour(s))  SARS Coronavirus 2 by RT PCR (hospital order, performed in [facility] hospital lab) Nasopharyngeal Nasopharyngeal Swab     Status: None   Collection Time: 08/21/19  8:05 PM   Specimen: Nasopharyngeal Swab  Result Value Ref Range Status   SARS Coronavirus 2 NEGATIVE NEGATIVE Final    Comment: (NOTE) If result is NEGATIVE SARS-CoV-2 target nucleic acids are NOT DETECTED. The SARS-CoV-2 RNA is generally detectable in upper and lower  respiratory specimens during the acute phase of infection. The lowest  concentration of SARS-CoV-2 viral copies this assay can detect is 250  copies / mL. A negative result does not preclude SARS-CoV-2 infection  and should not be used as the sole basis for treatment or other  patient management decisions.  A negative result may occur with  improper specimen collection / handling, submission of specimen other  than nasopharyngeal swab, presence of viral mutation(s) within the  areas  targeted by this assay, and inadequate number of viral copies  (<250 copies / mL). A negative result must be combined with clinical  observations, patient history, and epidemiological information. If result is POSITIVE SARS-CoV-2 target nucleic acids are DETECTED. The SARS-CoV-2 RNA is generally detectable in upper and lower  respiratory specimens dur ing the acute phase of infection.  Positive  results are indicative of active infection with SARS-CoV-2.  Clinical  correlation with patient history and other diagnostic information is  necessary to determine patient infection status.  Positive results do  not rule out bacterial infection or co-infection with other viruses. If result is PRESUMPTIVE POSTIVE SARS-CoV-2 nucleic acids MAY BE PRESENT.   A presumptive positive result was obtained on the submitted specimen  and confirmed on repeat testing.  While 2019 novel coronavirus  (SARS-CoV-2) nucleic acids may be present in the submitted sample  additional confirmatory testing may be necessary for epidemiological  and / or clinical management purposes  to differentiate between  SARS-CoV-2 and other Sarbecovirus currently known to infect humans.  If clinically indicated additional testing with an alternate test  methodology [phone]) is advised. The SARS-CoV-2 RNA is generally  detectable in upper and lower respiratory sp ecimens during the acute  phase of infection. The expected result is Negative. Fact Sheet for Patients:  StrictlyIdeas.no Fact Sheet for Healthcare Providers: BankingDealers.co.za This test is not yet approved or cleared by the [place] FDA and has been authorized for detection and/or diagnosis of SARS-CoV-2 by FDA under an Emergency Use Authorization (EUA).  This EUA will remain in effect (  meaning this test can be used) for the duration of the COVID-19 declaration under Section 564(b)(1) of the Act, 21 U.S.C. section  360bbb-3(b)(1), unless the authorization is terminated or revoked sooner. Performed at [facility], [address] [place], [place] [address]   Blood Culture (routine x 2)     Status: None (Preliminary result)   Collection Time: 08/21/19  8:17 PM   Specimen: BLOOD  Result Value Ref Range Status   Specimen Description BLOOD BLOOD LEFT HAND  Final   Special Requests   Final    BOTTLES DRAWN AEROBIC AND ANAEROBIC Blood Culture adequate volume   Culture   Final    NO GROWTH 4 DAYS Performed at [facility], [address] [place], [place] [address]    Report Status PENDING  Incomplete  Blood Culture (routine x 2)     Status: None (Preliminary result)   Collection Time: 08/21/19  8:18 PM   Specimen: BLOOD  Result Value Ref Range Status   Specimen Description BLOOD RIGHT ANTECUBITAL  Final   Special Requests   Final    BOTTLES DRAWN AEROBIC AND ANAEROBIC Blood Culture adequate volume   Culture   Final    NO GROWTH 4 DAYS Performed at [facility], [address], [place], [place] [address]    Report Status PENDING  Incomplete    Radiology Studies: No results found.      Scheduled Meds:  carvedilol  12.5 mg Oral BID   Chlorhexidine Gluconate Cloth  6 each Topical Daily   FLUoxetine  20 mg Oral Daily   insulin aspart  0-20 Units Subcutaneous TID WC   insulin aspart  0-5 Units Subcutaneous QHS   insulin glargine  20 Units Subcutaneous BID   nystatin ointment   Topical BID   pantoprazole  40 mg Oral Daily   Continuous Infusions:    LOS: 3 days    Time spent: 30 min     Ivor Costa, DO [facility] PAGER is on [place]  If 7PM-7AM, please contact night-coverage [email or web address] Password [facility] 08/25/2019, 5:01 PM

## 2019-08-25 NOTE — Progress Notes (Signed)
1200 CBG 510. Repeat was 469. Dr. Soledad Gerlach informed. One time dose of 15 units of Insulin ordered.

## 2019-08-25 NOTE — Progress Notes (Signed)
Hypoglycemic Event  CBG: 61  Treatment: 8 oz juice/soda  Symptoms: None  Follow-up CBG: Time:0500 CBG Result:65  Follow up CBG: Time: 0530  CBG results: 114  Possible Reasons for Event: Medication change  Comments/MD notified:[place] NP    Burnell Blanks C

## 2019-08-25 NOTE — NC FL2 (Signed)
[place] LEVEL OF CARE SCREENING TOOL     IDENTIFICATION  Patient Name: Kristy [name] Birthdate: 23-Jul-1951 Sex: female Admission Date (Current Location): 08/21/2019  [place] and [place] Number:  Engineering geologist and Address:  [facility], [address] [place], [place] [address]      Provider Number: [id number]  Attending Physician Name and Address:  Ivor Costa, MD  Relative Name and Phone Number:       Current Level of Care: Hospital Recommended Level of Care: [place] Prior Approval Number:    Date Approved/Denied:   PASRR Number: [id number]  Discharge Plan: SNF    Current Diagnoses: Patient Active Problem List   Diagnosis Date Noted  . Hyperosmolar hyperglycemic state (HHS) ([place]) 08/22/2019  . Sepsis ([place]) 08/22/2019  . Acute renal failure superimposed on stage 3a chronic kidney disease ([place]) 08/22/2019  . Acute metabolic encephalopathy 56/21/3086  . Hypokalemia   . Hypomagnesemia   . Encounter for competency evaluation   . Weakness   . Hypotension   . Hypernatremia   . Elevated troponin   . Sepsis secondary to UTI ([place])   . DKA (diabetic ketoacidoses) ([place]) 04/19/2019  . NICM (nonischemic cardiomyopathy) ([place]) 11/08/2018  . Chronic systolic CHF (congestive heart failure) ([place]) 08/22/2018  . AKI (acute kidney injury) ([place]) 05/13/2018  . Coronary artery disease, non-occlusive 09/13/2016  . Primary osteoarthritis involving multiple joints 03/14/2015  . Personal history of other malignant neoplasm of skin 12/01/2014  . Crohn's disease ([place]) 10/13/2013  . Major depressive disorder, recurrent, in full remission ([place]) 05/01/2011  . TRANSAMINASES, SERUM, ELEVATED 05/01/2010  . PSORIASIS 08/02/2009  . Hyperlipidemia 05/11/2009  . GOUT 02/05/2009  . Uncontrolled type 2 diabetes mellitus with stage 3 chronic kidney disease, with long-term current use of insulin ([place])   . Essential hypertension  04/18/2007  . LBBB (left bundle branch block) 04/18/2007  . ALLERGIC RHINITIS 04/18/2007    Orientation RESPIRATION BLADDER Height & Weight     Self  Normal Continent Weight: 150 lb (68 kg) Height:  5' 4"  (162.6 cm)  BEHAVIORAL SYMPTOMS/MOOD NEUROLOGICAL BOWEL NUTRITION STATUS  Other (Comment)(Anxiety.) (None) Continent Diet(Carb modified.)  AMBULATORY STATUS COMMUNICATION OF NEEDS Skin   Limited Assist Verbally Bruising, Other (Comment)(Rash.)                       Personal Care Assistance Level of Assistance  Bathing, Feeding, Dressing Bathing Assistance: Limited assistance Feeding assistance: Limited assistance Dressing Assistance: Limited assistance     Functional Limitations Info  Sight, Hearing, Speech Sight Info: Adequate Hearing Info: Adequate Speech Info: Adequate    SPECIAL CARE FACTORS FREQUENCY  PT (By licensed PT), OT (By licensed OT)     PT Frequency: 5 x week OT Frequency: 5 x week            Contractures Contractures Info: Not present    Additional Factors Info  Code Status, Allergies Code Status Info: Full Allergies Info: Fish Oil, Glimepiride, Guanfacine Hcl, Rosiglitazone           Current Medications (08/25/2019):  This is the current hospital active medication list Current Facility-Administered Medications  Medication Dose Route Frequency Provider Last Rate Last Dose  . carvedilol (COREG) tablet 12.5 mg  12.5 mg Oral BID Ivor Costa, MD   12.5 mg at 08/25/19 1000  . Chlorhexidine Gluconate Cloth 2 % PADS 6 each  6 each Topical Daily Ivor Costa, MD   6 each  at 08/24/19 1427  . dextrose 50 % solution 0-50 mL  0-50 mL Intravenous PRN Ivor Costa, MD      . FLUoxetine (PROZAC) capsule 20 mg  20 mg Oral Daily Ivor Costa, MD   20 mg at 08/25/19 1000  . insulin aspart (novoLOG) injection 0-20 Units  0-20 Units Subcutaneous TID WC Ivor Costa, MD      . insulin aspart (novoLOG) injection 0-5 Units  0-5 Units Subcutaneous QHS Ivor Costa, MD       . insulin glargine (LANTUS) injection 20 Units  20 Units Subcutaneous BID Ivor Costa, MD   20 Units at 08/25/19 1000  . nystatin ointment (MYCOSTATIN)   Topical BID Ivor Costa, MD      . pantoprazole (PROTONIX) EC tablet 40 mg  40 mg Oral Daily Ivor Costa, MD   40 mg at 08/25/19 1000     Discharge Medications: Please see discharge summary for a list of discharge medications.  Relevant Imaging Results:  Relevant Lab Results:   Additional Information SS#: [id number]  Candie Chroman, LCSW

## 2019-08-25 NOTE — Progress Notes (Signed)
Inpatient Diabetes Program Recommendations  AACE/ADA: New Consensus Statement on Inpatient Glycemic Control (2015)  Target Ranges:  Prepandial:   less than 140 mg/dL      Peak postprandial:   less than 180 mg/dL (1-2 hours)      Critically ill patients:  140 - 180 mg/dL   Lab Results  Component Value Date   GLUCAP 249 (H) 08/25/2019   HGBA1C 10.9 (H) 07/08/2019    Review of Glycemic Control Results for Kristy [name], Kristy [name] (MRN [id number]) as of 08/25/2019 12:04  Ref. Range 08/24/2019 20:27 08/25/2019 00:06 08/25/2019 04:36 08/25/2019 04:38 08/25/2019 04:55 08/25/2019 05:42 08/25/2019 07:39 08/25/2019 11:29 08/25/2019 11:30  Glucose-Capillary Latest Ref Range: 70 - 99 mg/dL 270 (H) novolog 11 units 223 (H) novolog 11 units 61 (L) 62 (L) 65 (L) 114 (H) 249 (H)   510 (HH) 469 (H)     Diabetes history: DM2  Outpatient Diabetes medications:Humulin RU500 Insulin QID (with meals and at bedtime) per SSI <200- 0 units 201-300- 20 units 300-400- 50 units 401-500- 90 units >500- 100 units   Current orders for Inpatient glycemic control: Lantus 20 units BID; Novolog 0-20 Q4 hours  Inpatient Diabetes Program Recommendations:     NOTE:  Patient received 60 units of correction over the las 24 hrs.  Patient experienced low early this morning (62) receiving correction Q4 hours O/N.  -Please consider adjusting Novolog correction 0-20 TID with meals and 0-5 HS -Please consider adding meal coverage Novolog 10 units with meals if eats at least 50%   Thank you, Geoffry Paradise, RN, BSN Diabetes Coordinator Inpatient Diabetes Program [phone] (team pager from 8a-5p)

## 2019-08-25 NOTE — Progress Notes (Signed)
CRITICAL VALUE ALERT  Critical Value:  CBG 62  Date & Time Notied:  08/25/19 0436   Provider Notified: Stark Klein NP  Orders Received/Actions taken: 8oz juice given

## 2019-08-26 LAB — GLUCOSE, CAPILLARY
Glucose-Capillary: 510 mg/dL (ref 70–99)
Glucose-Capillary: 475 mg/dL — ABNORMAL HIGH (ref 70–99)
Glucose-Capillary: 499 mg/dL — ABNORMAL HIGH (ref 70–99)
Glucose-Capillary: 460 mg/dL — ABNORMAL HIGH (ref 70–99)
Glucose-Capillary: 363 mg/dL — ABNORMAL HIGH (ref 70–99)
Glucose-Capillary: 174 mg/dL — ABNORMAL HIGH (ref 70–99)
Glucose-Capillary: 71 mg/dL (ref 70–99)

## 2019-08-26 LAB — BASIC METABOLIC PANEL
Anion gap: 9 (ref 5–15)
BUN: 21 mg/dL (ref 8–23)
CO2: 20 mmol/L — ABNORMAL LOW (ref 22–32)
Calcium: 9.1 mg/dL (ref 8.9–10.3)
Chloride: 110 mmol/L (ref 98–111)
Creatinine, Ser: 0.8 mg/dL (ref 0.44–1.00)
GFR calc Af Amer: >60 mL/min (ref >60)
GFR calc non Af Amer: >60 mL/min (ref >60)
Glucose, Bld: 400 mg/dL — ABNORMAL HIGH (ref 70–99)
Potassium: 3.7 mmol/L (ref 3.5–5.1)
Sodium: 139 mmol/L (ref 135–145)

## 2019-08-26 LAB — CBC
HCT: 40 % (ref 36.0–46.0)
Hemoglobin: 13.3 g/dL (ref 12.0–15.0)
MCH: 28.4 pg (ref 26.0–34.0)
MCHC: 33.3 g/dL (ref 30.0–36.0)
MCV: 85.5 fL (ref 80.0–100.0)
Platelets: 133 10*3/uL — ABNORMAL LOW (ref 150–400)
RBC: 4.68 MIL/uL (ref 3.87–5.11)
RDW: 13.2 % (ref 11.5–15.5)
WBC: 11.2 10*3/uL — ABNORMAL HIGH (ref 4.0–10.5)
nRBC: 0 % (ref 0.0–0.2)

## 2019-08-26 LAB — PATHOLOGIST SMEAR REVIEW

## 2019-08-26 LAB — MAGNESIUM: Magnesium: 1.7 mg/dL (ref 1.7–2.4)

## 2019-08-26 MED ORDER — INSULIN ASPART 100 UNIT/ML ~~LOC~~ SOLN: 0.0000 [IU] | Freq: Three times a day (TID) | SUBCUTANEOUS | Status: DC

## 2019-08-26 MED ORDER — INSULIN ASPART 100 UNIT/ML ~~LOC~~ SOLN
25.0000 [IU] | Freq: Once | SUBCUTANEOUS | Status: AC
  Administered 2019-08-26: 25 [IU] via SUBCUTANEOUS
  Filled 2019-08-26: qty 1

## 2019-08-26 MED ORDER — INSULIN ASPART 100 UNIT/ML ~~LOC~~ SOLN
0.0000 [IU] | Freq: Every day | SUBCUTANEOUS | Status: DC
  Administered 2019-08-28: 2 [IU] via SUBCUTANEOUS
  Filled 2019-08-26: qty 1

## 2019-08-26 MED ORDER — INSULIN REGULAR HUMAN (CONC) 500 UNIT/ML ~~LOC~~ SOPN
50.0000 [IU] | PEN_INJECTOR | Freq: Three times a day (TID) | SUBCUTANEOUS | Status: DC
  Filled 2019-08-26 (×2): qty 3

## 2019-08-26 MED ORDER — HALOPERIDOL LACTATE 5 MG/ML IJ SOLN
2.0000 mg | Freq: Once | INTRAMUSCULAR | Status: AC
  Administered 2019-08-26: 2 mg via INTRAVENOUS
  Filled 2019-08-26: qty 1

## 2019-08-26 MED ORDER — INSULIN REGULAR HUMAN (CONC) 500 UNIT/ML ~~LOC~~ SOPN
20.0000 [IU] | PEN_INJECTOR | Freq: Three times a day (TID) | SUBCUTANEOUS | Status: DC
  Filled 2019-08-26: qty 3

## 2019-08-26 MED ORDER — INSULIN ASPART 100 UNIT/ML ~~LOC~~ SOLN: 25.0000 [IU] | Freq: Once | SUBCUTANEOUS | Status: DC

## 2019-08-26 MED ORDER — INSULIN REGULAR HUMAN (CONC) 500 UNIT/ML ~~LOC~~ SOPN
20.0000 [IU] | PEN_INJECTOR | Freq: Three times a day (TID) | SUBCUTANEOUS | Status: DC
  Administered 2019-08-26 – 2019-08-27 (×2): 90 [IU] via SUBCUTANEOUS
  Administered 2019-08-27 – 2019-08-28 (×3): 20 [IU] via SUBCUTANEOUS
  Administered 2019-08-28: 12:00:00 90 [IU] via SUBCUTANEOUS
  Administered 2019-08-29: 50 [IU] via SUBCUTANEOUS
  Administered 2019-08-29: 20 [IU] via SUBCUTANEOUS
  Administered 2019-08-29 – 2019-08-30 (×3): 50 [IU] via SUBCUTANEOUS
  Administered 2019-08-31: 20 [IU] via SUBCUTANEOUS
  Administered 2019-08-31: 90 [IU] via SUBCUTANEOUS
  Administered 2019-09-01: 100 [IU] via SUBCUTANEOUS
  Administered 2019-09-01 – 2019-09-02 (×3): 60 [IU] via SUBCUTANEOUS
  Administered 2019-09-03 (×2): 100 [IU] via SUBCUTANEOUS
  Administered 2019-09-04 (×2): 60 [IU] via SUBCUTANEOUS
  Administered 2019-09-04: 30 [IU] via SUBCUTANEOUS
  Administered 2019-09-05: 110 [IU] via SUBCUTANEOUS
  Administered 2019-09-05: 30 [IU] via SUBCUTANEOUS
  Administered 2019-09-05 – 2019-09-06 (×2): 60 [IU] via SUBCUTANEOUS
  Administered 2019-09-06: 30 [IU] via SUBCUTANEOUS
  Administered 2019-09-07: 60 [IU] via SUBCUTANEOUS
  Administered 2019-09-07: 100 [IU] via SUBCUTANEOUS
  Administered 2019-09-08: 60 [IU] via SUBCUTANEOUS
  Administered 2019-09-08: 30 [IU] via SUBCUTANEOUS
  Administered 2019-09-09: 60 [IU] via SUBCUTANEOUS
  Administered 2019-09-09 – 2019-09-10 (×2): 30 [IU] via SUBCUTANEOUS
  Administered 2019-09-10 – 2019-09-12 (×5): 60 [IU] via SUBCUTANEOUS
  Administered 2019-09-13: 100 [IU] via SUBCUTANEOUS
  Administered 2019-09-14 (×2): 30 [IU] via SUBCUTANEOUS
  Administered 2019-09-15: 60 [IU] via SUBCUTANEOUS
  Administered 2019-09-16 (×3): 30 [IU] via SUBCUTANEOUS
  Filled 2019-08-26 (×4): qty 3

## 2019-08-26 NOTE — Progress Notes (Addendum)
Inpatient Diabetes Program Recommendations  AACE/ADA: New Consensus Statement on Inpatient Glycemic Control (2015)  Target Ranges:  Prepandial:   less than 140 mg/dL      Peak postprandial:   less than 180 mg/dL (1-2 hours)      Critically ill patients:  140 - 180 mg/dL   Lab Results  Component Value Date   GLUCAP 499 (H) 08/26/2019   HGBA1C 10.9 (H) 07/08/2019    Review of Glycemic Control  Results for Kristy, [name] (MRN [id number]) as of 08/26/2019 08:30  Ref. Range 08/25/2019 11:29 08/25/2019 11:30 08/25/2019 16:28 08/25/2019 21:20 08/26/2019 08:04  Glucose-Capillary Latest Ref Range: 70 - 99 mg/dL 510 (HH) 469 (H) Novolog 15units 243 (H) Novolog 7units 322 (H) Novolog 4units 475 (H) No coverage noted    Diabetes history: DM2 Outpatient Diabetes medications:                               Humulin RU500 Insulin QID (with meals and at bedtime) per SSI <200- 0 units 201-300- 20 units 300-400- 50 units 401-500- 90 units >500- 100 units  Current orders for Inpatient glycemic control: Novolog 0-20 TOD with meals + 0-5 HS + Lantus 20 units BID  Inpatient Diabetes Program Recommendations:     Note: CBG's still very elevated  -Please consider home dose of U500 SSI as above; hold the bedtime dose.  Thank you, Geoffry Paradise, RN, BSN Diabetes Coordinator Inpatient Diabetes Program [phone] (team pager from 8a-5p)

## 2019-08-26 NOTE — Progress Notes (Signed)
  Patient became increasingly restless and impulsive early this AM, as evidenced by: repeatedly setting off bed alarm, stating she needs to go get her car outside and leave, and exiting her room multiple times. At approximately 0425, patient's agitation increased and she left room 253 and began to exit the floor, despite multiple attempts by this RN and staff to subdue and reorient her. This RN accompanied patient, attempted to provide diversions and alternate options, but patient refused interventions and threatened to physically attack anyone who attempted to get to close to her. Patient grabbed this RN's arm and became violent. Security was called to escort patient back to her room. Provider was notified of the need for assistance. Patient remains restless and paranoid, threatening to leave AMA.

## 2019-08-26 NOTE — Progress Notes (Signed)
Patient care taken over by this RN. Patient asleep in bed. Bed alarm on for safety. Call bell within reach.

## 2019-08-26 NOTE — Progress Notes (Signed)
PROGRESS NOTE    Kristy [name]  XIH:[id number] DOB: 1951-05-24 DOA: 08/21/2019 PCP: Owens Loffler, MD    Brief Narrative:  Kristy [name] is a 68 y.o. female with medical history significant of nonischemic cardiomyopathy, LBBB, systolic congestive heart failure, Crohn's disease, chronic kidney disease stage III, type 2 diabetes with hx of DKA, hypertension who presents with altered mental status.  Patient is unable to provide history as she is confused and ill-appearing. Patient reportedly was brought in by EMS from home for altered mental status and increased blood glucose.  Blood glucose with EMS in the 600s and improved down to 331 post 500 cc normal saline with EMS.  She reportedly was actively vomiting at the time of EMS arrival.  She was noted by nursing to be alert and oriented only to self in the ED and was disoriented and pulling on her leads and IV lines. She was recently discharged on 08/13/2019 with DKA and reportedly was non-compliant with her medications.   ED Course: Patient was afebrile and normotensive but tachycardic up to 130s and intermittently tachypneic up to 28 and had to be placed on 2 L via nasal cannula. CBC shows leukocytosis of 18.7, hemoglobin of 15.2 CMP showed glucose of 1434, BUN of 46, creatinine of 2.17, anion gap of 26.Troponin of 18 and 22.  Initial lactate of 7.6 with improvement down to 4.1 after fluid resuscitation. PH of 7.28, Bicarb of 23.  Chest x-ray shows mild bronchitic thick changes. She was given 3L of NS bolus and started on vancomycin and cefepime for broad-spectrum antibiotic coverage with no clear source of infection.  Interim History: - 11/20: CT-head negative - 11/20: d/c'ed insulin gtt after blood sugar improved, started lantus and SSI - 11/22: Mental status has not come back to baseline.  Blood sugar still fluctuating. - 11/22: started diet, started heart/carb diet - 11/23: Procalcitonin < 0.10 and lactic acid 1.2.  No  leukocytosis.  Antibiotics was discontinued. - 11/24: consulted psychiatry. Per Dr. Maryellen Pile, "At this time, patient does not have ability to understand the details of her regimen nor administer it on a regular basis"   Subjective: Patient is still confused, oriented to person, not place and time.  Denies chest pain, abdominal pain.  No active nausea vomiting, diarrhea noted. No fever or chills.   Assessment & Plan:   Principal Problem:   Hyperosmolar hyperglycemic state (HHS) ([place]) Active Problems:   Uncontrolled type 2 diabetes mellitus with stage 3 chronic kidney disease, with long-term current use of insulin (HCC)   Essential hypertension   LBBB (left bundle branch block)   Coronary artery disease, non-occlusive   Chronic systolic CHF (congestive heart failure) (HCC)   NICM (nonischemic cardiomyopathy) (HCC)   Elevated troponin   Hypernatremia   Sepsis ([place])   Acute renal failure superimposed on stage 3a chronic kidney disease ([place])   Acute metabolic encephalopathy   Hyperosmolar hyperglycemic state with coma (HHS) ([place]): this is due to medication noncompliance. Mental status improved compired to the time on admission, but still not back to baseline. 11/20: d/c'ed insulin gtt after blood sugar improved, started lantus and SSI. Per her daughter, pt often skips her medication. She has hx of depression, family asked for psych evaluation - Lantus 20 unit bid  - SSI -resistant -->change to home  - will consult psychiatry  Depression:  Per her daughter, pt often skips her medication. She has hx of depression, family asked for psych evaluation. Consulted psychiatry 11/24, per  Dr. Zane Herald, pt does not have the ability to make medical decisions for her self. GIven her recent history of the same, Dr. Claris Gower thinks the trend is showing worsening cognition. -prozac -highly appreciate Dr. Asher Muir recommendations. -per Dr. Claris Gower, In case of agitation please utilize Haldol  2 mg IV or Ativan 2 mg IV.    Acute metabolic encephalopathy: improving, but not back to baseline yet. CT-head negative. -Frequent neuro check -started diet, started heart/carb diet  Sepsis: Patient meets criteria for sepsis with leukocytosis, tachycardia, tachypnea.  Etiology is not clear.  Patient has elevated lactic acid 7.6, 4.1, 2.6, 3.2,  Chest x-ray showed mild bronchitis change.  Urinalysis negative.  COVID-19 negative.  Leukocytosis resolved.  11/23: Procalcitonin < 0.10 and lactic acid 1.2.  No leukocytosis.    -Continue vancomycin and cefepime --> may narrow  11/23 -->Antibiotics was discontinued 11/23 -Follow-up of blood culture  CBC Latest Ref Rng & Units 08/26/2019 08/25/2019 08/24/2019  WBC 4.0 - 10.5 K/uL 11.2(H) 9.9 9.6  Hemoglobin 12.0 - 15.0 g/dL 13.3 13.2 11.7(L)  Hematocrit 36.0 - 46.0 % 40.0 42.0 35.8(L)  Platelets 150 - 400 K/uL 133(L) 119(L) 142(L)    Acute on chronic kidney disease stage IIIa: Baseline creatinine 1.0-1.4.  Her creatinine was 1.61 on admission, improved,. Cre 0.73 - follow serial BMP   Lab Results  Component Value Date   CREATININE 0.80 08/26/2019   CREATININE 0.73 08/25/2019   CREATININE 0.80 08/24/2019    Suspected coffee-ground emesis: Hgb stable 15.2 -->13.8 -->11.7 -->13.2. no active hematemesis any more.. Seen by GI in recent admission last week and they did not recommend procedure and to take Protonix for a month  -on Protonix -hold ASA and plavis -check FOBT  Chronic systolic congestive heart failure with EF 35 to 40%. BNP 207 -->303. No signs of acute exacerbation -Coreg  -hold Aldactone, lasix and Entresto for AoCKD-IIIa --> may restart on 11/24   Uncontrolled type 2 diabetes mellitus with stage 3 chronic kidney disease, with long-term current use of insulin ([place]): A1c 10.9 on 07/08/19.  - Lantus 20 unit bid  - SSI-resistant -->changed to home insulin regular human CONCENTRATED (HUMULIN R U-500 KWIKPEN) 500 UNIT/ML kwikpen   Essential hypertension:  -started coreg 11/22 -hold lasix and [place]  Coronary artery disease, non-occlusive and elevated trop: Trop 19 -->22. Likely due to demand ischemia -Hold aspirin and Plavix due to possible coffee-ground emesis.  Hypernatremia: resolved -f/u by BMP   DVT prophylaxis: SCD Code Status: full Family Communication:  Not today Discharge plan: Patient mental status improves, but has not come back to baseline.  Blood sugar is still fluctuates.  Not ready for discharge.   Consultants:   none  Procedures:  none  Antimicrobials:  Anti-infectives (From admission, onward)   Start     Dose/Rate Route Frequency Ordered Stop   08/25/19 2200  vancomycin (VANCOCIN) 1,250 mg in sodium chloride 0.9 % 250 mL IVPB  Status:  Discontinued     1,250 mg 166.7 mL/hr over 90 Minutes Intravenous Every 24 hours 08/25/19 1007 08/25/19 1400   08/24/19 2200  vancomycin (VANCOCIN) 1,250 mg in sodium chloride 0.9 % 250 mL IVPB  Status:  Discontinued     1,250 mg 166.7 mL/hr over 90 Minutes Intravenous Every 24 hours 08/24/19 2000 08/25/19 1007   08/22/19 2200  vancomycin (VANCOCIN) IVPB 750 mg/150 ml premix  Status:  Discontinued     750 mg 150 mL/hr over 60 Minutes Intravenous Every 24 hours 08/22/19 0701 08/24/19 2000  08/22/19 0900  ceFEPIme (MAXIPIME) 2 g in sodium chloride 0.9 % 100 mL IVPB  Status:  Discontinued     2 g 200 mL/hr over 30 Minutes Intravenous Every 12 hours 08/22/19 0701 08/25/19 1400   08/22/19 0140  vancomycin variable dose per unstable renal function (pharmacist dosing)  Status:  Discontinued      Does not apply See admin instructions 08/22/19 0140 08/22/19 0925   08/21/19 2000  vancomycin (VANCOCIN) 1,250 mg in sodium chloride 0.9 % 250 mL IVPB     1,250 mg 166.7 mL/hr over 90 Minutes Intravenous  Once 08/21/19 1943 08/22/19 0156   08/21/19 1945  ceFEPIme (MAXIPIME) 2 g in sodium chloride 0.9 % 100 mL IVPB     2 g 200 mL/hr over 30 Minutes Intravenous   Once 08/21/19 1940 08/21/19 2153   08/21/19 1945  vancomycin (VANCOCIN) IVPB 1000 mg/200 mL premix  Status:  Discontinued     1,000 mg 200 mL/hr over 60 Minutes Intravenous  Once 08/21/19 1940 08/21/19 1943       Objective: Vitals:   08/25/19 1500 08/25/19 1606 08/25/19 2001 08/26/19 0340  BP: 130/78 107/60 110/64 119/67  Pulse: 95 93 (!) 103 98  Resp: 16 20 20 20   Temp:  98.7 F (37.1 C) 98.8 F (37.1 C) 97.9 F (36.6 C)  TempSrc:  Oral Oral Oral  SpO2: 96% 98% 95% 98%  Weight:    63.4 kg  Height:        Intake/Output Summary (Last 24 hours) at 08/26/2019 0756 Last data filed at 08/26/2019 0203 Gross per 24 hour  Intake 360 ml  Output 1500 ml  Net -1140 ml   Filed Weights   08/21/19 1709 08/26/19 0340  Weight: 68 kg 63.4 kg    Examination:  Physical Exam: Dry mucous membrane  General: Not in acute distress HEENT: PERRL, EOMI, no scleral icterus, No JVD or bruit Cardiac: S1/S2, RRR, No murmurs, gallops or rubs Pulm:  No rales, wheezing, rhonchi or rubs. Abd: Soft, nondistended, nontender,  no organomegaly, BS present Ext: No edema. 1+DP/PT pulse bilaterally Musculoskeletal: No joint deformities, erythema, or stiffness, ROM full Skin: No rashes.  Neuro: Patient is alert, orientated to person, but not to time.  She moves all extremities normally. Psych: Patient is not psychotic, no suicidal or hemocidal ideation.    Data Reviewed: I have personally reviewed following labs and imaging studies  CBC: Recent Labs  Lab 08/21/19 1701 08/23/19 0551 08/24/19 0449 08/25/19 0651 08/26/19 0551  WBC 18.7* 16.0* 9.6 9.9 11.2*  NEUTROABS 14.8*  --   --   --   --   HGB 15.2* 13.8 11.7* 13.2 13.3  HCT 53.0* 43.7 35.8* 42.0 40.0  MCV 100.6* 92.0 88.8 90.5 85.5  PLT 319 199 142* 119* 812*   Basic Metabolic Panel: Recent Labs  Lab 08/22/19 1252 08/22/19 1622 08/23/19 0551 08/24/19 0449 08/24/19 1710 08/25/19 0651 08/25/19 1216 08/26/19 0551  NA 162* 159*   --  145  --  141  --  139  K 4.4 3.6  --  3.2*  --  3.8  --  3.7  CL 122* 122*  --  115*  --  112*  --  110  CO2 29 28  --  20*  --  20*  --  20*  GLUCOSE 181* 198*  --  127* 425* 174* 485* 400*  BUN 41* 40*  --  28*  --  20  --  21  CREATININE 1.32*  1.30*  --  0.80  --  0.73  --  0.80  CALCIUM 10.5* 9.5  --  8.3*  --  8.6*  --  9.1  MG  --   --  2.1 1.7  --  1.8  --  1.7   GFR: Estimated Creatinine Clearance: 58.1 mL/min (by C-G formula based on SCr of 0.8 mg/dL). Liver Function Tests: Recent Labs  Lab 08/21/19 1701  AST 19  ALT 26  ALKPHOS 245*  BILITOT 1.3*  PROT 8.3*  ALBUMIN 3.5   No results for input(s): LIPASE, AMYLASE in the last 168 hours. No results for input(s): AMMONIA in the last 168 hours. Coagulation Profile: No results for input(s): INR, PROTIME in the last 168 hours. Cardiac Enzymes: No results for input(s): CKTOTAL, CKMB, CKMBINDEX, TROPONINI in the last 168 hours. BNP (last 3 results) No results for input(s): PROBNP in the last 8760 hours. HbA1C: No results for input(s): HGBA1C in the last 72 hours. CBG: Recent Labs  Lab 08/25/19 0739 08/25/19 1129 08/25/19 1130 08/25/19 1628 08/25/19 2120  GLUCAP 249* 510* 469* 243* 322*   Lipid Profile: No results for input(s): CHOL, HDL, LDLCALC, TRIG, CHOLHDL, LDLDIRECT in the last 72 hours. Thyroid Function Tests: No results for input(s): TSH, T4TOTAL, FREET4, T3FREE, THYROIDAB in the last 72 hours. Anemia Panel: No results for input(s): VITAMINB12, FOLATE, FERRITIN, TIBC, IRON, RETICCTPCT in the last 72 hours. Sepsis Labs: Recent Labs  Lab 08/22/19 0207 08/22/19 0809 08/22/19 1020 08/25/19 0651 08/25/19 0909  PROCALCITON  --   --   --   --  <0.10  LATICACIDVEN 3.6* 2.6* 3.2* 1.2  --     Recent Results (from the past 240 hour(s))  SARS Coronavirus 2 by RT PCR (hospital order, performed in [place] hospital lab) Nasopharyngeal Nasopharyngeal Swab     Status: None   Collection Time: 08/21/19   8:05 PM   Specimen: Nasopharyngeal Swab  Result Value Ref Range Status   SARS Coronavirus 2 NEGATIVE NEGATIVE Final    Comment: (NOTE) If result is NEGATIVE SARS-CoV-2 target nucleic acids are NOT DETECTED. The SARS-CoV-2 RNA is generally detectable in upper and lower  respiratory specimens during the acute phase of infection. The lowest  concentration of SARS-CoV-2 viral copies this assay can detect is 250  copies / mL. A negative result does not preclude SARS-CoV-2 infection  and should not be used as the sole basis for treatment or other  patient management decisions.  A negative result may occur with  improper specimen collection / handling, submission of specimen other  than nasopharyngeal swab, presence of viral mutation(s) within the  areas targeted by this assay, and inadequate number of viral copies  (<250 copies / mL). A negative result must be combined with clinical  observations, patient history, and epidemiological information. If result is POSITIVE SARS-CoV-2 target nucleic acids are DETECTED. The SARS-CoV-2 RNA is generally detectable in upper and lower  respiratory specimens dur ing the acute phase of infection.  Positive  results are indicative of active infection with SARS-CoV-2.  Clinical  correlation with patient history and other diagnostic information is  necessary to determine patient infection status.  Positive results do  not rule out bacterial infection or co-infection with other viruses. If result is PRESUMPTIVE POSTIVE SARS-CoV-2 nucleic acids MAY BE PRESENT.   A presumptive positive result was obtained on the submitted specimen  and confirmed on repeat testing.  While 2019 novel coronavirus  (SARS-CoV-2) nucleic acids may be present in the  submitted sample  additional confirmatory testing may be necessary for epidemiological  and / or clinical management purposes  to differentiate between  SARS-CoV-2 and other Sarbecovirus currently known to infect  humans.  If clinically indicated additional testing with an alternate test  methodology [phone]) is advised. The SARS-CoV-2 RNA is generally  detectable in upper and lower respiratory sp ecimens during the acute  phase of infection. The expected result is Negative. Fact Sheet for Patients:  StrictlyIdeas.no Fact Sheet for Healthcare Providers: BankingDealers.co.za This test is not yet approved or cleared by the [place] FDA and has been authorized for detection and/or diagnosis of SARS-CoV-2 by FDA under an Emergency Use Authorization (EUA).  This EUA will remain in effect (meaning this test can be used) for the duration of the COVID-19 declaration under Section 564(b)(1) of the Act, 21 U.S.C. section 360bbb-3(b)(1), unless the authorization is terminated or revoked sooner. Performed at [facility], [address] [place], [place] [address]   Blood Culture (routine x 2)     Status: None (Preliminary result)   Collection Time: 08/21/19  8:17 PM   Specimen: BLOOD  Result Value Ref Range Status   Specimen Description BLOOD BLOOD LEFT HAND  Final   Special Requests   Final    BOTTLES DRAWN AEROBIC AND ANAEROBIC Blood Culture adequate volume   Culture   Final    NO GROWTH 4 DAYS Performed at [facility], [address], [place], [place] [address]    Report Status PENDING  Incomplete  Blood Culture (routine x 2)     Status: None (Preliminary result)   Collection Time: 08/21/19  8:18 PM   Specimen: BLOOD  Result Value Ref Range Status   Specimen Description BLOOD RIGHT ANTECUBITAL  Final   Special Requests   Final    BOTTLES DRAWN AEROBIC AND ANAEROBIC Blood Culture adequate volume   Culture   Final    NO GROWTH 4 DAYS Performed at [facility], [address], [place], [place] [address]    Report Status PENDING  Incomplete    Radiology Studies: No results found.      Scheduled Meds: .  carvedilol  12.5 mg Oral BID  . Chlorhexidine Gluconate Cloth  6 each Topical Daily  . FLUoxetine  20 mg Oral Daily  . insulin aspart  0-20 Units Subcutaneous TID WC  . insulin aspart  0-5 Units Subcutaneous QHS  . insulin glargine  20 Units Subcutaneous BID  . nystatin ointment   Topical BID  . pantoprazole  40 mg Oral Daily   Continuous Infusions:    LOS: 4 days    Time spent: 30 min     Ivor Costa, DO [facility] PAGER is on [place]  If 7PM-7AM, please contact night-coverage [email or web address] Password [facility] 08/26/2019, 7:56 AM

## 2019-08-26 NOTE — Consult Note (Signed)
[place] [facility] Consult   Reason for Consult: Noncompliance with medications, altered mental status Referring Physician: Dr. Blaine Hamper Patient Identification: Kristy [name] MRN:  [id number] Principal Diagnosis: Hyperosmolar hyperglycemic state (HHS) [facility]) Diagnosis:  Principal Problem:   Hyperosmolar hyperglycemic state (HHS) ([place]) Active Problems:   Uncontrolled type 2 diabetes mellitus with stage 3 chronic kidney disease, with long-term current use of insulin ([place])   Essential hypertension   LBBB (left bundle branch block)   Coronary artery disease, non-occlusive   Chronic systolic CHF (congestive heart failure) (HCC)   NICM (nonischemic cardiomyopathy) (HCC)   Elevated troponin   Hypernatremia   Sepsis ([place])   Acute renal failure superimposed on stage 3a chronic kidney disease ([place])   Acute metabolic encephalopathy   Total Time spent with patient: 45 minutes  Subjective:   Kristy [name] is a 68 y.o. female patient who presented with altered mental status.  HPI: Patient is a 68 year old female with multiple medical issues including cardiomyopathy, Crohn's disease, chronic kidney disease and diabetes with history of DKA.  Patient was found in her home feeling ill with extremely high blood sugars.  Patient has a history of being noncompliant with her medications and her insulin regimen.  Psychiatry was consulted due to patient's continued noncompliance as well as her altered mental status.  Writer is familiar as Probation officer interviewed patient approximately 3 weeks ago during her first hospitalization for similar reasons.  At that time it was discussed with patient that letting her go home alone was risky and that she should consider SNF placement.  Patient at that time was adamant that she would be able to take care of herself and denied the need for additional help.  She did report at that time that if this incidence continue to happen that she would be more open to the idea  of 24-hour assistance or facility placement. Upon interview today, patient appeared to be cognitively declined.  She is able to state the time due to in place but unable to maintain a logical line of reasoning throughout the conversation.  Despite talking about serious medical issues patient remains focused on health of her dog and the whereabouts of her vehicle all of which were extremely nonsignificant to the conversation.  Furthermore patient kept referring to the the nurse as "the girl pain for my food" and was unable to understand the concept that the food was provided with the hospital stay, and that the girl caring for her was in fact her nurse.  Patient did make other bizarre statements stating that she was in the hospital due to a problem with her legs, but was unable to state what the problem was.  Furthermore patient knows that one of her front teeth is missing, and is able to state that this happened recently, but is unable to state the circumstances surrounding it.  This was discussed with patient and she seems indifferent to the discrepancy between her reported mental state and her lack of understanding of basic concepts and instances surrounding her condition.  Past Psychiatric History: No previous history of psychiatric hospitalizations, suicide attempts, or outpatient psychiatric visits.  Risk to Self:  No Risk to Others:  No Prior Inpatient Therapy:  No Prior Outpatient Therapy:  No     Past Medical History:  Past Medical History:  Diagnosis Date  . Allergic rhinitis   . Allergy   . Cataract    mild   . Crohn's disease ([place]) 10/13/2013  . Diverticulosis   .  GERD (gastroesophageal reflux disease)   . Gout   . HFrEF (heart failure with reduced ejection fraction) ([place])    a. 12/2008 Cath: EF 45% w/ inf HK; b. 07/2009 Echo: EF 50-55%; c. 08/2018 Echo: EF 20-25%.  Marland Kitchen Hyperlipidemia   . Hypertension   . IBS (irritable bowel syndrome)   . Left bundle branch block   . Neuromuscular  disorder (HCC)    neuropathy  . NICM (nonischemic cardiomyopathy) ([place])    a. 12/2008 Cath: no significant dzs, EF 45% w/ inf HK->Med Rx; b. 07/2009 Echo: EF 50-55%; c. 08/2018 Echo: EF 20-25%, ant/antsept HK, mild MR, mildly dil LA, nl RV fx; d. 08/2018 Cath: D1 80, otw nonobs dzs->Med rx.  . Non-obstructive CAD (coronary artery disease)    a. 12/2008 Cath: no significant dzs, EF 45% w/ inf HK->Med Rx; b. 08/2018 Cath: LM nl, LAD min irregs, D1 80, LCX 30md, RCA min irregs->Med Rx.  . Osteoarthritis   . Poorly controlled Diabetes mellitus    a. 07/2018 A1c 13.1.  .Marland KitchenSymptomatic cholelithiasis   . Uncontrolled type 2 diabetes mellitus with stage 3 chronic kidney disease, with long-term current use of insulin ([place]          Past Surgical History:  Procedure Laterality Date  . BREAST BIOPSY Right 06/24/2019   stereo bx/ x clip/ path pending  . BREAST CYST ASPIRATION    . COLONOSCOPY    . DILATION AND CURETTAGE OF UTERUS    . LEFT HEART CATH AND CORONARY ANGIOGRAPHY N/A 04/24/2019   Procedure: LEFT HEART CATH AND CORONARY ANGIOGRAPHY;  Surgeon: ENelva Bush MD;  [facility];  Service: Cardiovascular;  Laterality: N/A;  . RIGHT/LEFT HEART CATH AND CORONARY ANGIOGRAPHY N/A 08/26/2018   Procedure: RIGHT/LEFT HEART CATH AND CORONARY ANGIOGRAPHY;  Surgeon: AWellington Hampshire MD;  [facility];  Service: Cardiovascular;  Laterality: N/A;  . SHOULDER SURGERY     15 + yrs ago   . SKIN SURGERY     nose   . VAGINAL HYSTERECTOMY     Family History:  Family History  Problem Relation Age of Onset  . Kidney failure Mother   . Hypertension Father   . Kidney failure Brother   . Colon cancer Neg Hx   . Colon polyps Neg Hx   . Rectal cancer Neg Hx   . Stomach cancer Neg Hx   . Breast cancer Neg Hx    Family Psychiatric  History: Denies Social History:  Social History   Substance and Sexual Activity  Alcohol Use No     Social History   Substance and  Sexual Activity  Drug Use No    Social History   Socioeconomic History  . Marital status: Widowed    Spouse name: Not on file  . Number of children: Not on file  . Years of education: Not on file  . Highest education level: Not on file  Occupational History  . Not on file  Social Needs  . Financial resource strain: Not on file  . Food insecurity    Worry: Not on file    Inability: Not on file  . Transportation needs    Medical: Not on file    Non-medical: Not on file  Tobacco Use  . Smoking status: Former Smoker    Packs/day: 0.75    Years: 30.00    Pack years: 22.50    Types: Cigarettes    Quit date: 06/06/1997    Years  since quitting: 22.2  . Smokeless tobacco: Never Used  Substance and Sexual Activity  . Alcohol use: No  . Drug use: No  . Sexual activity: Not on file  Lifestyle  . Physical activity    Days per week: Not on file    Minutes per session: Not on file  . Stress: Not on file  Relationships  . Social Herbalist on phone: Not on file    Gets together: Not on file    Attends religious service: Not on file    Active member of club or organization: Not on file    Attends meetings of clubs or organizations: Not on file    Relationship status: Not on file  Other Topics Concern  . Not on file  Social History Narrative  . Not on file   Additional Social History: Patient reports her living alone.  Closest relative is Retail buyer.    Allergies:   Allergies  Allergen Reactions  . Fish Oil Other (See Comments)    Gout  . Glimepiride Other (See Comments)    REACTION: hypoglycemia  . Guanfacine Hcl Other (See Comments)    REACTION: unspecified  . Rosiglitazone Other (See Comments)    CHF    Labs:  Results for orders placed or performed during the hospital encounter of 08/21/19 (from the past 48 hour(s))  Glucose, random     Status: Abnormal   Collection Time: 08/24/19  5:10 PM  Result Value Ref Range   Glucose, Bld 425 (H) 70 - 99  mg/dL    Comment: Performed at [facility], [address] [place], [place] [address]  Glucose, capillary     Status: Abnormal   Collection Time: 08/24/19  8:27 PM  Result Value Ref Range   Glucose-Capillary 270 (H) 70 - 99 mg/dL  Glucose, capillary     Status: Abnormal   Collection Time: 08/25/19 12:06 AM  Result Value Ref Range   Glucose-Capillary 223 (H) 70 - 99 mg/dL  Glucose, capillary     Status: Abnormal   Collection Time: 08/25/19  4:36 AM  Result Value Ref Range   Glucose-Capillary 61 (L) 70 - 99 mg/dL  Glucose, capillary     Status: Abnormal   Collection Time: 08/25/19  4:38 AM  Result Value Ref Range   Glucose-Capillary 62 (L) 70 - 99 mg/dL  Glucose, capillary     Status: Abnormal   Collection Time: 08/25/19  4:55 AM  Result Value Ref Range   Glucose-Capillary 65 (L) 70 - 99 mg/dL  Glucose, capillary     Status: Abnormal   Collection Time: 08/25/19  5:42 AM  Result Value Ref Range   Glucose-Capillary 114 (H) 70 - 99 mg/dL  Lactic acid, plasma     Status: None   Collection Time: 08/25/19  6:51 AM  Result Value Ref Range   Lactic Acid, Venous 1.2 0.5 - 1.9 mmol/L    Comment: Performed at [facility], [address] [place], [place] [address]  AM - CBC     Status: Abnormal   Collection Time: 08/25/19  6:51 AM  Result Value Ref Range   WBC 9.9 4.0 - 10.5 K/uL   RBC 4.64 3.87 - 5.11 MIL/uL   Hemoglobin 13.2 12.0 - 15.0 g/dL   HCT 42.0 36.0 - 46.0 %   MCV 90.5 80.0 - 100.0 fL   MCH 28.4 26.0 - 34.0 pg   MCHC 31.4 30.0 - 36.0 g/dL   RDW  13.1 11.5 - 15.5 %   Platelets 119 (L) 150 - 400 K/uL   nRBC 0.0 0.0 - 0.2 %    Comment: Performed at [facility], [address] [place], [place] [address]  AM - BMET     Status: Abnormal   Collection Time: 08/25/19  6:51 AM  Result Value Ref Range   Sodium 141 135 - 145 mmol/L   Potassium 3.8 3.5 - 5.1 mmol/L   Chloride 112 (H) 98 - 111 mmol/L   CO2 20 (L) 22 - 32 mmol/L   Glucose, Bld 174  (H) 70 - 99 mg/dL   BUN 20 8 - 23 mg/dL   Creatinine, Ser 0.73 0.44 - 1.00 mg/dL   Calcium 8.6 (L) 8.9 - 10.3 mg/dL   GFR calc non Af Amer >60 >60 mL/min   GFR calc Af Amer >60 >60 mL/min   Anion gap 9 5 - 15    Comment: Performed at [facility], [address] [place], [place] [address]  AM - Mg     Status: None   Collection Time: 08/25/19  6:51 AM  Result Value Ref Range   Magnesium 1.8 1.7 - 2.4 mg/dL    Comment: Performed at [facility], [address] [place], [place] [address]  Glucose, capillary     Status: Abnormal   Collection Time: 08/25/19  7:39 AM  Result Value Ref Range   Glucose-Capillary 249 (H) 70 - 99 mg/dL  Today - LDH     Status: None   Collection Time: 08/25/19  9:09 AM  Result Value Ref Range   LDH 170 98 - 192 U/L    Comment: Performed at [facility], [address] [place], [place] [address]  Procalcitonin     Status: None   Collection Time: 08/25/19  9:09 AM  Result Value Ref Range   Procalcitonin <0.10 ng/mL    Comment:        Interpretation: PCT (Procalcitonin) <= 0.5 ng/mL: Systemic infection (sepsis) is not likely. Local bacterial infection is possible. (NOTE)       Sepsis PCT Algorithm           Lower Respiratory Tract                                      Infection PCT Algorithm    ----------------------------     ----------------------------         PCT < 0.25 ng/mL                PCT < 0.10 ng/mL         Strongly encourage             Strongly discourage   discontinuation of antibiotics    initiation of antibiotics    ----------------------------     -----------------------------       PCT 0.25 - 0.50 ng/mL            PCT 0.10 - 0.25 ng/mL               OR       >80% decrease in PCT            Discourage initiation of  antibiotics      Encourage discontinuation           of antibiotics    ----------------------------     -----------------------------         PCT  >= 0.50 ng/mL              PCT 0.26 - 0.50 ng/mL               AND        <80% decrease in PCT             Encourage initiation of                                             antibiotics       Encourage continuation           of antibiotics    ----------------------------     -----------------------------        PCT >= 0.50 ng/mL                  PCT > 0.50 ng/mL               AND         increase in PCT                  Strongly encourage                                      initiation of antibiotics    Strongly encourage escalation           of antibiotics                                     -----------------------------                                           PCT <= 0.25 ng/mL                                                 OR                                        > 80% decrease in PCT                                     Discontinue / Do not initiate                                             antibiotics Performed at [facility], [address] [place], [place] [address]   Glucose, capillary     Status: Abnormal   Collection Time: 08/25/19 11:29  AM  Result Value Ref Range   Glucose-Capillary 510 (HH) 70 - 99 mg/dL    Comment: TEST WILL BE CREDITED REPEATED TO VERIFY Performed at [facility], [address] [place], [place] [address]    Comment 1 Notify RN    Comment 2 Repeat Test   Glucose, capillary     Status: Abnormal   Collection Time: 08/25/19 11:30 AM  Result Value Ref Range   Glucose-Capillary 469 (H) 70 - 99 mg/dL  Glucose, random     Status: Abnormal   Collection Time: 08/25/19 12:16 PM  Result Value Ref Range   Glucose, Bld 485 (H) 70 - 99 mg/dL    Comment: Performed at [facility], [address] [place], [place] [address]  Glucose, capillary     Status: Abnormal   Collection Time: 08/25/19  4:28 PM  Result Value Ref Range   Glucose-Capillary 243 (H) 70 - 99 mg/dL  Glucose, capillary     Status: Abnormal   Collection  Time: 08/25/19  9:20 PM  Result Value Ref Range   Glucose-Capillary 322 (H) 70 - 99 mg/dL  Today - Peripheral smear per pathologist     Status: None   Collection Time: 08/26/19  5:51 AM  Result Value Ref Range   Path Review Blood smear is reviewed.     Comment: Mild absolute leukocytosis with unremarkable differential. Normal RBC parameters. Mild thrombocytopenia, without evidence of platelet clumping or satellitism. Reviewed by Kathi Simpers, M.D. Performed at [facility], [address] [place], [place] [address]   AM - CBC     Status: Abnormal   Collection Time: 08/26/19  5:51 AM  Result Value Ref Range   WBC 11.2 (H) 4.0 - 10.5 K/uL   RBC 4.68 3.87 - 5.11 MIL/uL   Hemoglobin 13.3 12.0 - 15.0 g/dL   HCT 40.0 36.0 - 46.0 %   MCV 85.5 80.0 - 100.0 fL   MCH 28.4 26.0 - 34.0 pg   MCHC 33.3 30.0 - 36.0 g/dL   RDW 13.2 11.5 - 15.5 %   Platelets 133 (L) 150 - 400 K/uL   nRBC 0.0 0.0 - 0.2 %    Comment: Performed at [facility], [address] [place], [place] [address]  AM - BMET     Status: Abnormal   Collection Time: 08/26/19  5:51 AM  Result Value Ref Range   Sodium 139 135 - 145 mmol/L   Potassium 3.7 3.5 - 5.1 mmol/L   Chloride 110 98 - 111 mmol/L   CO2 20 (L) 22 - 32 mmol/L   Glucose, Bld 400 (H) 70 - 99 mg/dL   BUN 21 8 - 23 mg/dL   Creatinine, Ser 0.80 0.44 - 1.00 mg/dL   Calcium 9.1 8.9 - 10.3 mg/dL   GFR calc non Af Amer >60 >60 mL/min   GFR calc Af Amer >60 >60 mL/min   Anion gap 9 5 - 15    Comment: Performed at [facility], [address] [place], [place] [address]  AM - Mg     Status: None   Collection Time: 08/26/19  5:51 AM  Result Value Ref Range   Magnesium 1.7 1.7 - 2.4 mg/dL    Comment: Performed at [facility], [address] [place],  [address]  Glucose, capillary     Status: Abnormal   Collection Time: 08/26/19  8:04 AM  Result Value Ref Range   Glucose-Capillary 475 (H) 70 -  99 mg/dL   Glucose, capillary     Status: Abnormal   Collection Time: 08/26/19  8:30 AM  Result Value Ref Range   Glucose-Capillary 499 (H) 70 - 99 mg/dL  Glucose, capillary     Status: Abnormal   Collection Time: 08/26/19 11:38 AM  Result Value Ref Range   Glucose-Capillary 460 (H) 70 - 99 mg/dL  Glucose, capillary     Status: Abnormal   Collection Time: 08/26/19  2:26 PM  Result Value Ref Range   Glucose-Capillary 363 (H) 70 - 99 mg/dL    Current Facility-Administered Medications  Medication Dose Route Frequency Provider Last Rate Last Dose  . carvedilol (COREG) tablet 12.5 mg  12.5 mg Oral BID Ivor Costa, MD   12.5 mg at 08/26/19 1002  . Chlorhexidine Gluconate Cloth 2 % PADS 6 each  6 each Topical Daily Ivor Costa, MD   6 each at 08/25/19 1100  . dextrose 50 % solution 0-50 mL  0-50 mL Intravenous PRN Ivor Costa, MD      . FLUoxetine (PROZAC) capsule 20 mg  20 mg Oral Daily Ivor Costa, MD   20 mg at 08/26/19 1002  . insulin aspart (novoLOG) injection 0-5 Units  0-5 Units Subcutaneous QHS Ivor Costa, MD      . insulin regular human CONCENTRATED (HUMULIN R) 500 UNIT/ML kwikpen 20-100 Units  20-100 Units Subcutaneous TID WC Ivor Costa, MD   90 Units at 08/26/19 1305  . nystatin ointment (MYCOSTATIN)   Topical BID Ivor Costa, MD      . pantoprazole (PROTONIX) EC tablet 40 mg  40 mg Oral Daily Ivor Costa, MD   40 mg at 08/26/19 1002    Musculoskeletal: Strength & Muscle Tone: within normal limits Gait & Station: unsteady Patient leans: N/A  Psychiatric Specialty Exam: Physical Exam  Review of Systems  Constitutional: Negative for fever.  HENT: Negative for hearing loss.   Eyes: Negative for blurred vision.  Cardiovascular: Negative for chest pain.  Skin: Negative for rash.    Blood pressure (!) 104/53, pulse (!) 103, temperature 99.3 F (37.4 C), temperature source Oral, resp. rate 18, height 5' 4"  (1.626 m), weight 63.4 kg, SpO2 97 %.Body mass index is 24 kg/m.  General Appearance:  Disheveled  Eye Contact:  Fair  Speech:  Clear and Coherent  Volume:  Normal  Mood:  Euthymic  Affect:  Appropriate  Thought Process:  Disorganized  Orientation:  Full (Time, Place, and Person)  Thought Content:  Illogical, Obsessions, Rumination, Tangential and Abstract Reasoning  Suicidal Thoughts:  No  Homicidal Thoughts:  No  Memory:  Recent;   Poor  Judgement:  Poor  Insight:  Shallow  Psychomotor Activity:  Tremor  Concentration:  Concentration: Fair  Recall:  [employer] of Knowledge:  Fair  Language:  Fair  Akathisia:  No  Handed:  Right  AIMS (if indicated):     Assets:  Communication Skills Desire for Improvement Housing Social Support  ADL's:  Impaired  Cognition:  Impaired,  Mild  Sleep:        Treatment Plan Summary: 68 year old woman with history of multiple medical issues, most relevant to this admission is her diabetes and lack of ability to maintain adequate blood sugar control.  This is now the second time the patient has been hospitalized due to her inability to maintain her own blood glucose level.  At this time, patient does not have ability to understand the details of her regimen nor administer it on  a regular basis.  Patient will need help on a daily basis to assist with medication delivery and adherence.  Thankfully patient is able to understand this and acknowledge that she will require assistance and is open to the idea of 24-hour aide and/or assisted living facility placement.  Medications: In case of agitation please utilize Haldol 2 mg IV or Ativan 2 mg IV.   Disposition: Patient does not meet criteria for psychiatric inpatient admission. Supportive therapy provided about ongoing stressors. Discussed crisis plan, support from social network, calling 911, coming to the Emergency Department, and calling Suicide Hotline.  Dixie Dials, MD 08/26/2019 4:18 PM

## 2019-08-26 NOTE — Progress Notes (Signed)
PT Cancellation Note  Patient Details Name: Kristy [name] MRN: [id number] DOB: September 04, 1951   Cancelled Treatment:    Reason Eval/Treat Not Completed: Patient not medically ready.  Chart reviewed.  Pt's last noted glucose up-trending to 499.  Pt also given Haldol this morning (behavior concerns noted in nursing note this morning).  Will hold PT at this time d/t noted concerns and re-attempt PT session at a later date/time as appropriate.  Leitha Bleak, PT 08/26/19, 10:22 AM [phone]

## 2019-08-26 NOTE — Care Management Important Message (Signed)
Important Message  Patient Details  Name: Kristy [name] MRN: [id number] Date of Birth: Jan 29, 1951   Medicare Important Message Given:  Yes     [name] 08/26/2019, 11:34 AM

## 2019-08-26 NOTE — Progress Notes (Signed)
OT Cancellation Note  Patient Details Name: Kristy [name] MRN: [id number] DOB: 07/19/1951   Cancelled Treatment:    Reason Eval/Treat Not Completed: Medical issues which prohibited therapy. Consult received, chart reviewed. Pt noted with recent glucose of 400. Also given Haldol at 5:24am. Pt inappropriate for therapy at this time. Will continue to follow acutely and re-attempt at later date/time as medically appropriate.   Jeni Salles, MPH, MS, OTR/L ascom [phone] 08/26/19, 8:03 AM

## 2019-08-27 ENCOUNTER — Telehealth: Payer: Self-pay

## 2019-08-27 LAB — CBC
HCT: 36.4 % (ref 36.0–46.0)
Hemoglobin: 11.7 g/dL — ABNORMAL LOW (ref 12.0–15.0)
MCH: 28.8 pg (ref 26.0–34.0)
MCHC: 32.1 g/dL (ref 30.0–36.0)
MCV: 89.7 fL (ref 80.0–100.0)
Platelets: 113 10*3/uL — ABNORMAL LOW (ref 150–400)
RBC: 4.06 MIL/uL (ref 3.87–5.11)
RDW: 13.4 % (ref 11.5–15.5)
WBC: 11.4 10*3/uL — ABNORMAL HIGH (ref 4.0–10.5)
nRBC: 0 % (ref 0.0–0.2)

## 2019-08-27 LAB — GLUCOSE, CAPILLARY
Glucose-Capillary: 193 mg/dL — ABNORMAL HIGH (ref 70–99)
Glucose-Capillary: 254 mg/dL — ABNORMAL HIGH (ref 70–99)
Glucose-Capillary: 488 mg/dL — ABNORMAL HIGH (ref 70–99)
Glucose-Capillary: 292 mg/dL — ABNORMAL HIGH (ref 70–99)
Glucose-Capillary: 194 mg/dL — ABNORMAL HIGH (ref 70–99)

## 2019-08-27 LAB — BASIC METABOLIC PANEL
Anion gap: 8 (ref 5–15)
BUN: 16 mg/dL (ref 8–23)
CO2: 23 mmol/L (ref 22–32)
Calcium: 8.9 mg/dL (ref 8.9–10.3)
Chloride: 109 mmol/L (ref 98–111)
Creatinine, Ser: 0.82 mg/dL (ref 0.44–1.00)
GFR calc Af Amer: >60 mL/min (ref >60)
GFR calc non Af Amer: >60 mL/min (ref >60)
Glucose, Bld: 256 mg/dL — ABNORMAL HIGH (ref 70–99)
Potassium: 3.5 mmol/L (ref 3.5–5.1)
Sodium: 140 mmol/L (ref 135–145)

## 2019-08-27 NOTE — Telephone Encounter (Signed)
Pt's daughter, Jocelyn Lamer, called in regards to the pt and her ability to take care of herself. She is working on getting guardianship of her. She is unable to do her insulin on her own which has placed her in the hospital twice in the last 3 weeks. Family believes she has Dementia. Once she is released from the hospital, they are hoping to have her placed in a facility.  Jocelyn Lamer is needing a statement from the PCP stating she is unable to care for herself.  Please call Jocelyn Lamer at [phone] with any questions or when it is ready.  She is aware that it may be next week before this is addressed. She said that would be fine because she will not be leaving the hospital any time soon.

## 2019-08-27 NOTE — Evaluation (Signed)
Occupational Therapy Evaluation Patient Details Name: Kristy [name] MRN: [id number] DOB: [birth year] Today's Date: 08/27/2019    History of Present Illness 68 y.o. female was admitted again after a two week return home, noted to have AMS, acute encephalopathy, sepsis and coffee ground emesis.  Pt reports falls but has a great deal of issues with memory both acutely and LT, for example did not know her age.  PMHx:  CAD, nonischemic cardiomyopathy, DKA, DM, hematemesis, encephalopathy, hypernatremia, UTI with sepsis, CHF, AMS, EF 35-40%   Clinical Impression   Pt seen for OT evaluation this date. Of note, pt evaluated by this therapist during recent admission. Pt demonstrates improved safety/indep with ADL and mobility this admission as compared to previous admission. However, continues to demonstrate mild impairments in cognition impacting her overall safety and ability to safely perform higher level IADL tasks such as medication mgt. Hx of noncompliance with multiple readmissions related to this. Prior to hospital admission, pt was living by herself with limited family support. Pt reports strained relationship with family. Currently pt demonstrates impairments in cognition and mild balance deficits requiring supervision for safety. Pt near baseline functional status, but continues to be limited by cognitive deficits. Upon hospital discharge, recommend pt discharge with 24/7 supervision/asisst. Would strongly recommend considering ALF for longer term solution to minimize risk of readmission.     Follow Up Recommendations  Supervision/Assistance - 24 hour;No OT follow up    Equipment Recommendations       Recommendations for Other Services       Precautions / Restrictions Precautions Precautions: Fall Restrictions Weight Bearing Restrictions: No      Mobility Bed Mobility Overal bed mobility: Modified Independent Bed Mobility: Supine to Sit     Supine to sit: Modified independent  (Device/Increase time)        Transfers Overall transfer level: Modified independent Equipment used: Rolling walker (2 wheeled) Transfers: Sit to/from Stand Sit to Stand: Supervision         General transfer comment: supervision for safety    Balance Overall balance assessment: Mild deficits observed, not formally tested Sitting-balance support: Feet supported Sitting balance-Leahy Scale: Normal Sitting balance - Comments: no difficulty reaching outside of BOS and putting socks on   Standing balance support: Bilateral upper extremity supported;During functional activity Standing balance-Leahy Scale: Good Standing balance comment: no LOB or buckling noted with good endurance and tolerance for tasks.                           ADL either performed or assessed with clinical judgement   ADL Overall ADL's : Needs assistance/impaired                                       General ADL Comments: supervision level for safety     Vision Baseline Vision/History: Wears glasses Wears Glasses: Reading only Patient Visual Report: No change from baseline       Perception     Praxis      Pertinent Vitals/Pain Pain Assessment: No/denies pain     Hand Dominance Right   Extremity/Trunk Assessment Upper Extremity Assessment Upper Extremity Assessment: Overall WFL for tasks assessed   Lower Extremity Assessment Lower Extremity Assessment: Generalized weakness       Communication Communication Communication: No difficulties   Cognition Arousal/Alertness: Awake/alert Behavior During Therapy: WFL for tasks assessed/performed Overall Cognitive Status:  No family/caregiver present to determine baseline cognitive functioning                                 General Comments: Pt with STM deficits (from previous admissions) and impaired problem solving   General Comments       Exercises     Shoulder Instructions      Home Living  Family/patient expects to be discharged to:: Private residence Living Arrangements: Alone Available Help at Discharge: Family;Available PRN/intermittently Type of Home: House Home Access: Stairs to enter [employer] of Steps: 5 Entrance Stairs-Rails: Can reach both Home Layout: One level     Bathroom Shower/Tub: Teacher, early years/pre: Handicapped height     Home Equipment: Environmental consultant - 2 wheels;Cane - single point;Shower seat;Crutches;Grab bars - tub/shower   Additional Comments: family is expecting to send her to rehab but pt wants to return home and then consider ALF      Prior Functioning/Environment Level of Independence: Independent        Comments: excessive numbers of falls and admissions        OT Problem List: Decreased cognition;Decreased safety awareness      OT Treatment/Interventions:      OT Goals(Current goals can be found in the care plan section) Acute Rehab OT Goals Patient Stated Goal: go home OT Goal Formulation: All assessment and education complete, DC therapy  OT Frequency:     Barriers to D/C:            Co-evaluation              AM-PAC OT "6 Clicks" Daily Activity     Outcome Measure Help from another person eating meals?: None Help from another person taking care of personal grooming?: None Help from another person toileting, which includes using toliet, bedpan, or urinal?: A Little Help from another person bathing (including washing, rinsing, drying)?: A Little Help from another person to put on and taking off regular upper body clothing?: None Help from another person to put on and taking off regular lower body clothing?: A Little 6 Click Score: 21   End of Session Nurse Communication: Mobility status  Activity Tolerance: Patient tolerated treatment well Patient left: in chair;with call bell/phone within reach(alarms off upon OT's arrival, RN notified)  OT Visit Diagnosis: Other abnormalities of gait  and mobility (R26.89);Other symptoms and signs involving cognitive function                Time: 1610-9604 OT Time Calculation (min): 13 min Charges:  OT General Charges $OT Visit: 1 Visit OT Evaluation $OT Eval Low Complexity: 1 Low  Jeni Salles, MPH, MS, OTR/L ascom [phone] 08/27/19, 1:59 PM

## 2019-08-27 NOTE — NC FL2 (Signed)
[place] LEVEL OF CARE SCREENING TOOL     IDENTIFICATION  Patient Name: Kristy [name] Birthdate: 1951/05/06 Sex: female Admission Date (Current Location): 08/21/2019  [place] and [place] Number:  Engineering geologist and Address:  [facility], [address], [place], [place] [address]      Provider Number: [id number]  Attending Physician Name and Address:  Ivor Costa, MD  Relative Name and Phone Number:  Lucius [facility]) Niece   [phone] or Danyetta, [place] Daughter   [phone]    Current Level of Care: Hospital Recommended Level of Care: [place] Prior Approval Number:    Date Approved/Denied:   PASRR Number: [id number]  Discharge Plan: Other (Comment)(Patient will need an ALF)    Current Diagnoses: Patient Active Problem List   Diagnosis Date Noted  . Hyperosmolar hyperglycemic state (HHS) ([place]) 08/22/2019  . Sepsis ([place]) 08/22/2019  . Acute renal failure superimposed on stage 3a chronic kidney disease ([place]) 08/22/2019  . Acute metabolic encephalopathy 70/14/1030  . Hypokalemia   . Hypomagnesemia   . Encounter for competency evaluation   . Weakness   . Hypotension   . Hypernatremia   . Elevated troponin   . Sepsis secondary to UTI ([place])   . DKA (diabetic ketoacidoses) ([place]) 04/19/2019  . NICM (nonischemic cardiomyopathy) ([place]) 11/08/2018  . Chronic systolic CHF (congestive heart failure) ([place]) 08/22/2018  . AKI (acute kidney injury) ([place]) 05/13/2018  . Coronary artery disease, non-occlusive 09/13/2016  . Primary osteoarthritis involving multiple joints 03/14/2015  . Personal history of other malignant neoplasm of skin 12/01/2014  . Crohn's disease ([place]) 10/13/2013  . Major depressive disorder, recurrent, in full remission ([place]) 05/01/2011  . TRANSAMINASES, SERUM, ELEVATED 05/01/2010  . PSORIASIS 08/02/2009  . Hyperlipidemia 05/11/2009  . GOUT 02/05/2009  . Uncontrolled type 2 diabetes  mellitus with stage 3 chronic kidney disease, with long-term current use of insulin ([place])   . Essential hypertension 04/18/2007  . LBBB (left bundle branch block) 04/18/2007  . ALLERGIC RHINITIS 04/18/2007    Orientation RESPIRATION BLADDER Height & Weight     Self, Place  Normal Continent Weight: 141 lb 4.8 oz (64.1 kg) Height:  5' 4"  (162.6 cm)  BEHAVIORAL SYMPTOMS/MOOD NEUROLOGICAL BOWEL NUTRITION STATUS  Other (Comment)(Anxiety.) (None) Continent Diet(Carb modified)  AMBULATORY STATUS COMMUNICATION OF NEEDS Skin   Supervision Verbally Normal                       Personal Care Assistance Level of Assistance  Dressing, Feeding, Bathing Bathing Assistance: Limited assistance Feeding assistance: Limited assistance Dressing Assistance: Limited assistance     Functional Limitations Info  Sight, Hearing, Speech Sight Info: Adequate Hearing Info: Adequate Speech Info: Adequate    SPECIAL CARE FACTORS FREQUENCY  PT (By licensed PT), OT (By licensed OT)     PT Frequency: Home Health minimum 2x a week OT Frequency: Home Health Minimum 2x a week            Contractures Contractures Info: Not present    Additional Factors Info  Code Status, Allergies, Psychotropic, Insulin Sliding Scale Code Status Info: Full Code Allergies Info: Fish Oil Glimepiride Guanfacine Hcl Rosiglitazone Psychotropic Info: FLUoxetine (PROZAC) capsule 20 mg Insulin Sliding Scale Info: insulin regular human CONCENTRATED (HUMULIN R) 500 UNIT/ML kwikpen 20-100 Units 3x a day with meals       Current Medications (08/27/2019):  This is the current hospital active medication list Current Facility-Administered Medications  Medication Dose  Route Frequency Provider Last Rate Last Dose  . carvedilol (COREG) tablet 12.5 mg  12.5 mg Oral BID Ivor Costa, MD   12.5 mg at 08/27/19 0950  . Chlorhexidine Gluconate Cloth 2 % PADS 6 each  6 each Topical Daily Ivor Costa, MD   6 each at 08/27/19 [phone]  .  dextrose 50 % solution 0-50 mL  0-50 mL Intravenous PRN Ivor Costa, MD      . FLUoxetine (PROZAC) capsule 20 mg  20 mg Oral Daily Ivor Costa, MD   20 mg at 08/27/19 0950  . insulin aspart (novoLOG) injection 0-5 Units  0-5 Units Subcutaneous QHS Ivor Costa, MD      . insulin regular human CONCENTRATED (HUMULIN R) 500 UNIT/ML kwikpen 20-100 Units  20-100 Units Subcutaneous TID WC Ivor Costa, MD   20 Units at 08/27/19 1814  . nystatin ointment (MYCOSTATIN)   Topical BID Ivor Costa, MD      . pantoprazole (PROTONIX) EC tablet 40 mg  40 mg Oral Daily Ivor Costa, MD   40 mg at 08/27/19 9622     Discharge Medications: Please see discharge summary for a list of discharge medications.  Relevant Imaging Results:  Relevant Lab Results:   Additional Information SSN [id number]  [name], LCSW

## 2019-08-27 NOTE — Progress Notes (Signed)
Physical Therapy Treatment Patient Details Name: Kristy [name] MRN: [id number] DOB: [birth year] Today's Date: 08/27/2019    History of Present Illness 68 y.o. female was admitted again after a two week return home, noted to have AMS, acute encephalopathy, sepsis and coffee ground emesis.  Pt reports falls but has a great deal of issues with memory both acutely and LT, for example did not know her age.  PMHx:  CAD, nonischemic cardiomyopathy, DKA, DM, hematemesis, encephalopathy, hypernatremia, UTI with sepsis, CHF, AMS, EF 35-40%    PT Comments    Pt in bed, excited to walk and get out of bed.  Bed mobility without assist and is able to reach outside of BOS for objects and don socks in sitting with ease.  Stood with min guard and was able to walk 300' in hallway with RW for balance only.  She stated she felt comfortable walking with or without walker and upon return to room, she left it to the side and was able to walk to recliner without device and min guard/supervision.  She had no real fatigue noted and would have tolerated further gait but chose to return to room.    Pt has shown excellent progress with mobility skills but remains with cognitive deficits.  Given significant improvement in mobility, adjusted discharge recommendations to HHPT and supervision with mobility.  Will discuss with SWS and primary PT regarding progress in therapy.   Follow Up Recommendations  Home health PT;Supervision for mobility/OOB;Other (comment)     Equipment Recommendations  Rolling walker with 5" wheels    Recommendations for Other Services       Precautions / Restrictions Precautions Precautions: Fall Restrictions Weight Bearing Restrictions: No    Mobility  Bed Mobility Overal bed mobility: Modified Independent Bed Mobility: Supine to Sit     Supine to sit: Modified independent (Device/Increase time)        Transfers Overall transfer level: Needs assistance Equipment used: Rolling  walker (2 wheeled) Transfers: Sit to/from Stand Sit to Stand: Supervision            Ambulation/Gait Ambulation/Gait assistance: Supervision;Min guard Gait Distance (Feet): 300 Feet Assistive device: Rolling walker (2 wheeled) Gait Pattern/deviations: Step-through pattern;Decreased step length - right;Decreased step length - left     General Gait Details: generally steady gait. encouraged to use walker in  hallway for balance but once in room she left it to the side and was able to walk to recliner with min gaurd/supervision witthout difficulty.   Stairs             Wheelchair Mobility    Modified Rankin (Stroke Patients Only)       Balance Overall balance assessment: Needs assistance Sitting-balance support: Feet supported Sitting balance-Leahy Scale: Normal Sitting balance - Comments: no difficulty reaching outside of BOS and putting socks on   Standing balance support: Bilateral upper extremity supported;During functional activity Standing balance-Leahy Scale: Good Standing balance comment: no LOB or buckling noted with good endurance and tolerance for tasks.                            Cognition Arousal/Alertness: Awake/alert Behavior During Therapy: WFL for tasks assessed/performed Overall Cognitive Status: No family/caregiver present to determine baseline cognitive functioning  Exercises      General Comments        Pertinent Vitals/Pain Pain Assessment: No/denies pain    Home Living                      Prior Function            PT Goals (current goals can now be found in the care plan section) Progress towards PT goals: Progressing toward goals    Frequency    Min 2X/week      PT Plan Discharge plan needs to be updated    Co-evaluation              AM-PAC PT "6 Clicks" Mobility   Outcome Measure  Help needed turning from your back to your side  while in a flat bed without using bedrails?: None Help needed moving from lying on your back to sitting on the side of a flat bed without using bedrails?: None Help needed moving to and from a bed to a chair (including a wheelchair)?: A Little Help needed standing up from a chair using your arms (e.g., wheelchair or bedside chair)?: A Little Help needed to walk in hospital room?: A Little Help needed climbing 3-5 steps with a railing? : A Little 6 Click Score: 20    End of Session Equipment Utilized During Treatment: Gait belt Activity Tolerance: Patient tolerated treatment well Patient left: in chair;with call bell/phone within reach;with chair alarm set Nurse Communication: Mobility status       Time: 7262-0355 PT Time Calculation (min) (ACUTE ONLY): 13 min  Charges:  $Gait Training: 8-22 mins                     Chesley Noon, PTA 08/27/19, 11:10 AM

## 2019-08-27 NOTE — Progress Notes (Signed)
PROGRESS NOTE    Kristy [name]  TWS:[id number] DOB: [birth year] DOA: 08/21/2019 PCP: Owens Loffler, MD    Brief Narrative:  Kristy [name] is a 68 y.o. female with medical history significant of nonischemic cardiomyopathy, LBBB, systolic congestive heart failure, Crohn's disease, chronic kidney disease stage III, type 2 diabetes with hx of DKA, hypertension who presents with altered mental status.  Patient is unable to provide history as she is confused and ill-appearing. Patient reportedly was brought in by EMS from home for altered mental status and increased blood glucose.  Blood glucose with EMS in the 600s and improved down to 331 post 500 cc normal saline with EMS.  She reportedly was actively vomiting at the time of EMS arrival.  She was noted by nursing to be alert and oriented only to self in the ED and was disoriented and pulling on her leads and IV lines. She was recently discharged on 08/13/2019 with DKA and reportedly was non-compliant with her medications.   ED Course: Patient was afebrile and normotensive but tachycardic up to 130s and intermittently tachypneic up to 28 and had to be placed on 2 L via nasal cannula. CBC shows leukocytosis of 18.7, hemoglobin of 15.2 CMP showed glucose of 1434, BUN of 46, creatinine of 2.17, anion gap of 26.Troponin of 18 and 22.  Initial lactate of 7.6 with improvement down to 4.1 after fluid resuscitation. PH of 7.28, Bicarb of 23.  Chest x-ray shows mild bronchitic thick changes. She was given 3L of NS bolus and started on vancomycin and cefepime for broad-spectrum antibiotic coverage with no clear source of infection.  Interim History: - 11/20: CT-head negative - 11/20: d/c'ed insulin gtt after blood sugar improved, started lantus and SSI - 11/22: Mental status has not come back to baseline.  Blood sugar still fluctuating. - 11/22: started diet, started heart/carb diet - 11/23: Procalcitonin < 0.10 and lactic acid 1.2.  No  leukocytosis.  Antibiotics was discontinued. - 11/24: consulted psychiatry. Per Dr. Maryellen Pile, "At this time, patient does not have ability to understand the details of her regimen nor administer it on a regular basis"   Subjective: Patient is still confused, oriented to person and place but not to time.  Denies chest pain, abdominal pain.  No active nausea vomiting, diarrhea noted. No fever or chills.   Assessment & Plan:   Principal Problem:   Hyperosmolar hyperglycemic state (HHS) ([place]) Active Problems:   Uncontrolled type 2 diabetes mellitus with stage 3 chronic kidney disease, with long-term current use of insulin (HCC)   Essential hypertension   LBBB (left bundle branch block)   Coronary artery disease, non-occlusive   Chronic systolic CHF (congestive heart failure) (HCC)   NICM (nonischemic cardiomyopathy) (HCC)   Elevated troponin   Hypernatremia   Sepsis ([place])   Acute renal failure superimposed on stage 3a chronic kidney disease ([place])   Acute metabolic encephalopathy   Hyperosmolar hyperglycemic state with coma (HHS) ([place]): this is due to medication noncompliance. Mental status improved compired to the time on admission, but still not back to baseline. 11/20: d/c'ed insulin gtt after blood sugar improved, started lantus and SSI. Per her daughter, pt often skips her medication. She has hx of depression, family asked for psych evaluation - Lantus 20 unit bid  - SSI -resistant -->changed to home Humulin RU500 Insulin QID (with meals and at bedtime) per SSI - will consult psychiatry  Depression:  Per her daughter, pt often skips her medication. She has  hx of depression, family asked for psych evaluation. Consulted psychiatry 11/24, per Dr. Zane Herald, pt does not have the ability to make medical decisions for her self. GIven her recent history of the same, Dr. Claris Gower thinks the trend is showing worsening cognition. -prozac -highly appreciate Dr. Asher Muir  recommendations. -per Dr. Claris Gower, In case of agitation please utilize Haldol 2 mg IV or Ativan 2 mg IV.    Acute metabolic encephalopathy: improving, but not back to baseline yet. CT-head negative. -Frequent neuro check -started diet, started heart/carb diet  Sepsis: Patient meets criteria for sepsis with leukocytosis, tachycardia, tachypnea.  Etiology is not clear.  Patient has elevated lactic acid 7.6, 4.1, 2.6, 3.2,  Chest x-ray showed mild bronchitis change.  Urinalysis negative.  COVID-19 negative.  Leukocytosis resolved.  11/23: Procalcitonin < 0.10 and lactic acid 1.2.     -Continue vancomycin and cefepime --> may narrow  11/23 -->Antibiotics was discontinued 11/23 -Follow-up of blood culture  CBC Latest Ref Rng & Units 08/27/2019 08/26/2019 08/25/2019  WBC 4.0 - 10.5 K/uL 11.4(H) 11.2(H) 9.9  Hemoglobin 12.0 - 15.0 g/dL 11.7(L) 13.3 13.2  Hematocrit 36.0 - 46.0 % 36.4 40.0 42.0  Platelets 150 - 400 K/uL 113(L) 133(L) 119(L)    Acute on chronic kidney disease stage IIIa: Baseline creatinine 1.0-1.4.  Her creatinine was 1.61 on admission, improved. - follow serial BMP   Lab Results  Component Value Date   CREATININE 0.82 08/27/2019   CREATININE 0.80 08/26/2019   CREATININE 0.73 08/25/2019    Suspected coffee-ground emesis: Hgb stable 15.2 -->13.8 -->11.7 -->13.2. no active hematemesis any more.. Seen by GI in recent admission last week and they did not recommend procedure and to take Protonix for a month  -on Protonix -hold ASA and plavis -check FOBT  CBC Latest Ref Rng & Units 08/27/2019 08/26/2019 08/25/2019  WBC 4.0 - 10.5 K/uL 11.4(H) 11.2(H) 9.9  Hemoglobin 12.0 - 15.0 g/dL 11.7(L) 13.3 13.2  Hematocrit 36.0 - 46.0 % 36.4 40.0 42.0  Platelets 150 - 400 K/uL 113(L) 133(L) 119(L)    Chronic systolic congestive heart failure with EF 35 to 40%. BNP 207 -->303. No signs of acute exacerbation -Coreg  -hold Aldactone, lasix and Entresto for AoCKD-IIIa --> may  restart on 11/24   Uncontrolled type 2 diabetes mellitus with stage 3 chronic kidney disease, with long-term current use of insulin ([place]): A1c 10.9 on 07/08/19.  - Lantus 20 unit bid  - SSI-resistant -->changed to home insulin regular human CONCENTRATED (HUMULIN R U-500 KWIKPEN) 500 UNIT/ML kwikpen  Essential hypertension:  -started coreg 11/22 -hold lasix and [place]  Coronary artery disease, non-occlusive and elevated trop: Trop 19 -->22. Likely due to demand ischemia -Hold aspirin and Plavix due to possible coffee-ground emesis.  Hypernatremia: resolved -f/u by BMP   DVT prophylaxis: SCD Code Status: full Family Communication:  Not today Discharge plan: Patient mental status improves, but has not come back to baseline.  Pt needs placement. Working on placement. When bed is ready, will be d/c'ed.  Consultants:   none  Procedures:  none  Antimicrobials:  Anti-infectives (From admission, onward)   Start     Dose/Rate Route Frequency Ordered Stop   08/25/19 2200  vancomycin (VANCOCIN) 1,250 mg in sodium chloride 0.9 % 250 mL IVPB  Status:  Discontinued     1,250 mg 166.7 mL/hr over 90 Minutes Intravenous Every 24 hours 08/25/19 1007 08/25/19 1400   08/24/19 2200  vancomycin (VANCOCIN) 1,250 mg in sodium chloride 0.9 % 250 mL IVPB  Status:  Discontinued     1,250 mg 166.7 mL/hr over 90 Minutes Intravenous Every 24 hours 08/24/19 2000 08/25/19 1007   08/22/19 2200  vancomycin (VANCOCIN) IVPB 750 mg/150 ml premix  Status:  Discontinued     750 mg 150 mL/hr over 60 Minutes Intravenous Every 24 hours 08/22/19 0701 08/24/19 2000   08/22/19 0900  ceFEPIme (MAXIPIME) 2 g in sodium chloride 0.9 % 100 mL IVPB  Status:  Discontinued     2 g 200 mL/hr over 30 Minutes Intravenous Every 12 hours 08/22/19 0701 08/25/19 1400   08/22/19 0140  vancomycin variable dose per unstable renal function (pharmacist dosing)  Status:  Discontinued      Does not apply See admin instructions  08/22/19 0140 08/22/19 0925   08/21/19 2000  vancomycin (VANCOCIN) 1,250 mg in sodium chloride 0.9 % 250 mL IVPB     1,250 mg 166.7 mL/hr over 90 Minutes Intravenous  Once 08/21/19 1943 08/22/19 0156   08/21/19 1945  ceFEPIme (MAXIPIME) 2 g in sodium chloride 0.9 % 100 mL IVPB     2 g 200 mL/hr over 30 Minutes Intravenous  Once 08/21/19 1940 08/21/19 2153   08/21/19 1945  vancomycin (VANCOCIN) IVPB 1000 mg/200 mL premix  Status:  Discontinued     1,000 mg 200 mL/hr over 60 Minutes Intravenous  Once 08/21/19 1940 08/21/19 1943       Objective: Vitals:   08/27/19 0257 08/27/19 0314 08/27/19 0746 08/27/19 1538  BP:  128/64 120/65 (!) 116/58  Pulse:  92 92 96  Resp:   18 18  Temp:  100 F (37.8 C) 98.4 F (36.9 C) 98.4 F (36.9 C)  TempSrc:  Oral Oral   SpO2:  97% 97% 98%  Weight: 64.1 kg     Height:        Intake/Output Summary (Last 24 hours) at 08/27/2019 1802 Last data filed at 08/27/2019 1739 Gross per 24 hour  Intake -  Output 900 ml  Net -900 ml   Filed Weights   08/21/19 1709 08/26/19 0340 08/27/19 0257  Weight: 68 kg 63.4 kg 64.1 kg    Examination:  Physical Exam: Dry mucous membrane  General: Not in acute distress HEENT: PERRL, EOMI, no scleral icterus, No JVD or bruit Cardiac: S1/S2, RRR, No murmurs, gallops or rubs Pulm:  No rales, wheezing, rhonchi or rubs. Abd: Soft, nondistended, nontender,  no organomegaly, BS present Ext: No edema. 1+DP/PT pulse bilaterally Musculoskeletal: No joint deformities, erythema, or stiffness, ROM full Skin: No rashes.  Neuro: Patient is alert, orientated to person, but not to time.  She moves all extremities normally. Psych: Patient is not psychotic, no suicidal or hemocidal ideation.    Data Reviewed: I have personally reviewed following labs and imaging studies  CBC: Recent Labs  Lab 08/21/19 1701 08/23/19 0551 08/24/19 0449 08/25/19 0651 08/26/19 0551 08/27/19 0535  WBC 18.7* 16.0* 9.6 9.9 11.2* 11.4*   NEUTROABS 14.8*  --   --   --   --   --   HGB 15.2* 13.8 11.7* 13.2 13.3 11.7*  HCT 53.0* 43.7 35.8* 42.0 40.0 36.4  MCV 100.6* 92.0 88.8 90.5 85.5 89.7  PLT 319 199 142* 119* 133* 280*   Basic Metabolic Panel: Recent Labs  Lab 08/22/19 1622 08/23/19 0551 08/24/19 0449 08/24/19 1710 08/25/19 0651 08/25/19 1216 08/26/19 0551 08/27/19 0535  NA 159*  --  145  --  141  --  139 140  K 3.6  --  3.2*  --  3.8  --  3.7 3.5  CL 122*  --  115*  --  112*  --  110 109  CO2 28  --  20*  --  20*  --  20* 23  GLUCOSE 198*  --  127* 425* 174* 485* 400* 256*  BUN 40*  --  28*  --  20  --  21 16  CREATININE 1.30*  --  0.80  --  0.73  --  0.80 0.82  CALCIUM 9.5  --  8.3*  --  8.6*  --  9.1 8.9  MG  --  2.1 1.7  --  1.8  --  1.7  --    GFR: Estimated Creatinine Clearance: 56.7 mL/min (by C-G formula based on SCr of 0.82 mg/dL). Liver Function Tests: Recent Labs  Lab 08/21/19 1701  AST 19  ALT 26  ALKPHOS 245*  BILITOT 1.3*  PROT 8.3*  ALBUMIN 3.5   No results for input(s): LIPASE, AMYLASE in the last 168 hours. No results for input(s): AMMONIA in the last 168 hours. Coagulation Profile: No results for input(s): INR, PROTIME in the last 168 hours. Cardiac Enzymes: No results for input(s): CKTOTAL, CKMB, CKMBINDEX, TROPONINI in the last 168 hours. BNP (last 3 results) No results for input(s): PROBNP in the last 8760 hours. HbA1C: No results for input(s): HGBA1C in the last 72 hours. CBG: Recent Labs  Lab 08/26/19 2108 08/27/19 0316 08/27/19 0738 08/27/19 1130 08/27/19 1659  GLUCAP 71 193* 254* 488* 292*   Lipid Profile: No results for input(s): CHOL, HDL, LDLCALC, TRIG, CHOLHDL, LDLDIRECT in the last 72 hours. Thyroid Function Tests: No results for input(s): TSH, T4TOTAL, FREET4, T3FREE, THYROIDAB in the last 72 hours. Anemia Panel: No results for input(s): VITAMINB12, FOLATE, FERRITIN, TIBC, IRON, RETICCTPCT in the last 72 hours. Sepsis Labs: Recent Labs  Lab 08/22/19  0207 08/22/19 0809 08/22/19 1020 08/25/19 0651 08/25/19 0909  PROCALCITON  --   --   --   --  <0.10  LATICACIDVEN 3.6* 2.6* 3.2* 1.2  --     Recent Results (from the past 240 hour(s))  SARS Coronavirus 2 by RT PCR (hospital order, performed in [facility] hospital lab) Nasopharyngeal Nasopharyngeal Swab     Status: None   Collection Time: 08/21/19  8:05 PM   Specimen: Nasopharyngeal Swab  Result Value Ref Range Status   SARS Coronavirus 2 NEGATIVE NEGATIVE Final    Comment: (NOTE) If result is NEGATIVE SARS-CoV-2 target nucleic acids are NOT DETECTED. The SARS-CoV-2 RNA is generally detectable in upper and lower  respiratory specimens during the acute phase of infection. The lowest  concentration of SARS-CoV-2 viral copies this assay can detect is 250  copies / mL. A negative result does not preclude SARS-CoV-2 infection  and should not be used as the sole basis for treatment or other  patient management decisions.  A negative result may occur with  improper specimen collection / handling, submission of specimen other  than nasopharyngeal swab, presence of viral mutation(s) within the  areas targeted by this assay, and inadequate number of viral copies  (<250 copies / mL). A negative result must be combined with clinical  observations, patient history, and epidemiological information. If result is POSITIVE SARS-CoV-2 target nucleic acids are DETECTED. The SARS-CoV-2 RNA is generally detectable in upper and lower  respiratory specimens dur ing the acute phase of infection.  Positive  results are indicative of active infection with SARS-CoV-2.  Clinical  correlation with patient history and  other diagnostic information is  necessary to determine patient infection status.  Positive results do  not rule out bacterial infection or co-infection with other viruses. If result is PRESUMPTIVE POSTIVE SARS-CoV-2 nucleic acids MAY BE PRESENT.   A presumptive positive result was obtained  on the submitted specimen  and confirmed on repeat testing.  While 2019 novel coronavirus  (SARS-CoV-2) nucleic acids may be present in the submitted sample  additional confirmatory testing may be necessary for epidemiological  and / or clinical management purposes  to differentiate between  SARS-CoV-2 and other Sarbecovirus currently known to infect humans.  If clinically indicated additional testing with an alternate test  methodology [phone]) is advised. The SARS-CoV-2 RNA is generally  detectable in upper and lower respiratory sp ecimens during the acute  phase of infection. The expected result is Negative. Fact Sheet for Patients:  StrictlyIdeas.no Fact Sheet for Healthcare Providers: BankingDealers.co.za This test is not yet approved or cleared by the [place] FDA and has been authorized for detection and/or diagnosis of SARS-CoV-2 by FDA under an Emergency Use Authorization (EUA).  This EUA will remain in effect (meaning this test can be used) for the duration of the COVID-19 declaration under Section 564(b)(1) of the Act, 21 U.S.C. section 360bbb-3(b)(1), unless the authorization is terminated or revoked sooner. Performed at [facility], [address] [place], [place] [address]   Blood Culture (routine x 2)     Status: None (Preliminary result)   Collection Time: 08/21/19  8:17 PM   Specimen: BLOOD  Result Value Ref Range Status   Specimen Description BLOOD BLOOD LEFT HAND  Final   Special Requests   Final    BOTTLES DRAWN AEROBIC AND ANAEROBIC Blood Culture adequate volume   Culture   Final    NO GROWTH 4 DAYS Performed at [facility], [address], [place], [place] [address]    Report Status PENDING  Incomplete  Blood Culture (routine x 2)     Status: None (Preliminary result)   Collection Time: 08/21/19  8:18 PM   Specimen: BLOOD  Result Value Ref Range Status   Specimen Description BLOOD  RIGHT ANTECUBITAL  Final   Special Requests   Final    BOTTLES DRAWN AEROBIC AND ANAEROBIC Blood Culture adequate volume   Culture   Final    NO GROWTH 4 DAYS Performed at [facility], [address] [place], [place] [address]    Report Status PENDING  Incomplete    Radiology Studies: No results found.      Scheduled Meds: . carvedilol  12.5 mg Oral BID  . Chlorhexidine Gluconate Cloth  6 each Topical Daily  . FLUoxetine  20 mg Oral Daily  . insulin aspart  0-5 Units Subcutaneous QHS  . insulin regular human CONCENTRATED  20-100 Units Subcutaneous TID WC  . nystatin ointment   Topical BID  . pantoprazole  40 mg Oral Daily   Continuous Infusions:    LOS: 5 days    Time spent: 30 min     Ivor Costa, DO [facility] PAGER is on [place]  If 7PM-7AM, please contact night-coverage [email or web address] Password [facility] 08/27/2019, 6:02 PM

## 2019-08-27 NOTE — Progress Notes (Signed)
Ch visited with pt during rounding. Pt was a/o and sitting comfortably in the recliner. Ch allowed space for the pt to share about her recent hospitalizations and how she states that "she is giving up on living". Pt shared about the family dynamics related to her stepdaughter and other family members. Pt grieved the loss of her dog which she suspects was taken by her stepdaughter due to the frequent falls/fainting events that have lead to the pt being hospitalized. Pt is a 68 y.o. female widow that has a hx of diabetes, CHF, heart attacks, and hyperglycemic (pt had a blood sugar of 488 during ch visit around 1130 a.m.). Pt shared that she loss her spouse to lung disease a few years ago yet has been dealing with a contentious relationship with her stepdaughter. Pt feel like she does not have support of family and has considered going to a assisted living facility but she is not sure what to [facility] about her home. Pt reported that she drove herself to the hospital. Pt is not sure about what would be best for her TOC but was hopeful to have H-H with PT/OT. Ch offered time for the pt to give a life review and allowed to pt to express her needs related to maintaining self care.Ch prayed with pt, provided words of encouragement and comfort for pt.     08/27/19 1200  Clinical Encounter Type  Visited With Patient  Visit Type Psychological support;Spiritual support;Social support  Spiritual Encounters  Spiritual Needs Prayer;Emotional;Grief support  Stress Factors  Patient Stress Factors Financial concerns;Health changes;Lack of caregivers;Loss of control;Major life changes  Family Stress Factors Family relationships;Loss of control;Major life changes

## 2019-08-27 NOTE — TOC Progression Note (Addendum)
Transition of Care [facility]) - Progression Note    Patient Details  Name: Kristy [name] MRN: [id number] Date of Birth: [birth year]  [facility] CM/SW Contact  [name], [place] Phone Number: 08/27/2019, 6:57 PM  Clinical Narrative:     CSW spoke to [place] at [place], [phone], she has spoken to [facility], [place], they all have beds available and would like FL2 faxed to them.  CSW faxed requested clinicals to ALFs.  Patient's stepdaughter is working on pursuing guardianship, CSW faxed psych note to [phone].  CSW to follow up with patient's stepdaughter to see where she has decided.  Patient's stepdaughter will be visiting patient tomorrow, to discuss finances to decide which facility patient can go to.  PT worked with her and are recommending home health now.  Expected Discharge Plan: Skilled Nursing Facility Barriers to Discharge: Ship broker, Continued Medical Work up  Expected Discharge Plan and Services Expected Discharge Plan: [place] Choice: [place] arrangements for the past 2 months: Single Family Home                                       Social Determinants of Health (SDOH) Interventions    Readmission Risk Interventions Readmission Risk Prevention Plan 04/28/2019 04/27/2019  Post Dischage Appt - Complete  Medication Screening - Complete  Transportation Screening - Complete  PCP or Specialist Appt within 5-7 Days Complete -  Home Care Screening Complete -  Medication Review (RN CM) Complete -  Some recent data might be hidden

## 2019-08-28 DIAGNOSIS — D649 Anemia, unspecified: Secondary | ICD-10-CM

## 2019-08-28 DIAGNOSIS — G9341 Metabolic encephalopathy: Secondary | ICD-10-CM | POA: Diagnosis not present

## 2019-08-28 DIAGNOSIS — I428 Other cardiomyopathies: Secondary | ICD-10-CM

## 2019-08-28 LAB — BASIC METABOLIC PANEL
Anion gap: 8 (ref 5–15)
BUN: 14 mg/dL (ref 8–23)
CO2: 24 mmol/L (ref 22–32)
Calcium: 8.8 mg/dL — ABNORMAL LOW (ref 8.9–10.3)
Chloride: 110 mmol/L (ref 98–111)
Creatinine, Ser: 0.71 mg/dL (ref 0.44–1.00)
GFR calc Af Amer: >60 mL/min (ref >60)
GFR calc non Af Amer: >60 mL/min (ref >60)
Glucose, Bld: 277 mg/dL — ABNORMAL HIGH (ref 70–99)
Potassium: 3.5 mmol/L (ref 3.5–5.1)
Sodium: 142 mmol/L (ref 135–145)

## 2019-08-28 LAB — TYPE AND SCREEN
ABO/RH(D): O NEG
Antibody Screen: NEGATIVE

## 2019-08-28 LAB — CBC
HCT: 35.1 % — ABNORMAL LOW (ref 36.0–46.0)
HCT: 37.1 % (ref 36.0–46.0)
Hemoglobin: 11.1 g/dL — ABNORMAL LOW (ref 12.0–15.0)
Hemoglobin: 11.7 g/dL — ABNORMAL LOW (ref 12.0–15.0)
MCH: 28.5 pg (ref 26.0–34.0)
MCH: 28.7 pg (ref 26.0–34.0)
MCHC: 31.6 g/dL (ref 30.0–36.0)
MCHC: 31.5 g/dL (ref 30.0–36.0)
MCV: 90 fL (ref 80.0–100.0)
MCV: 90.9 fL (ref 80.0–100.0)
Platelets: 121 10*3/uL — ABNORMAL LOW (ref 150–400)
Platelets: 147 10*3/uL — ABNORMAL LOW (ref 150–400)
RBC: 3.9 MIL/uL (ref 3.87–5.11)
RBC: 4.08 MIL/uL (ref 3.87–5.11)
RDW: 13.6 % (ref 11.5–15.5)
RDW: 13.8 % (ref 11.5–15.5)
WBC: 10.4 10*3/uL (ref 4.0–10.5)
WBC: 11.6 10*3/uL — ABNORMAL HIGH (ref 4.0–10.5)
nRBC: 0 % (ref 0.0–0.2)
nRBC: 0 % (ref 0.0–0.2)

## 2019-08-28 LAB — GLUCOSE, CAPILLARY
Glucose-Capillary: 300 mg/dL — ABNORMAL HIGH (ref 70–99)
Glucose-Capillary: 487 mg/dL — ABNORMAL HIGH (ref 70–99)
Glucose-Capillary: 171 mg/dL — ABNORMAL HIGH (ref 70–99)
Glucose-Capillary: 234 mg/dL — ABNORMAL HIGH (ref 70–99)

## 2019-08-28 MED ORDER — SPIRONOLACTONE 25 MG PO TABS
25.0000 mg | ORAL_TABLET | Freq: Every day | ORAL | Status: DC
  Administered 2019-08-29 – 2019-09-23 (×25): 25 mg via ORAL
  Filled 2019-08-28 (×26): qty 1

## 2019-08-28 MED ORDER — PANTOPRAZOLE SODIUM 40 MG PO TBEC
40.0000 mg | DELAYED_RELEASE_TABLET | Freq: Two times a day (BID) | ORAL | Status: DC
  Administered 2019-08-28 – 2019-09-02 (×11): 40 mg via ORAL
  Filled 2019-08-28 (×11): qty 1

## 2019-08-28 MED ORDER — HYDRALAZINE HCL 25 MG PO TABS: 25.0000 mg | ORAL_TABLET | Freq: Three times a day (TID) | ORAL | Status: DC | PRN

## 2019-08-28 NOTE — Progress Notes (Addendum)
PROGRESS NOTE    Kristy [name]  JSE:[id number] DOB: Aug 17, 1951 DOA: 08/21/2019 PCP: Owens Loffler, MD    Brief Narrative:  Kristy [name] is a 68 y.o. female with medical history significant of nonischemic cardiomyopathy, LBBB, systolic congestive heart failure, Crohn's disease, chronic kidney disease stage III, type 2 diabetes with hx of DKA, hypertension who presents with altered mental status.  Patient is unable to provide history as she is confused and ill-appearing. Patient reportedly was brought in by EMS from home for altered mental status and increased blood glucose.  Blood glucose with EMS in the 600s and improved down to 331 post 500 cc normal saline with EMS.  She reportedly was actively vomiting at the time of EMS arrival.  She was noted by nursing to be alert and oriented only to self in the ED and was disoriented and pulling on her leads and IV lines. She was recently discharged on 08/13/2019 with DKA and reportedly was non-compliant with her medications.   ED Course: Patient was afebrile and normotensive but tachycardic up to 130s and intermittently tachypneic up to 28 and had to be placed on 2 L via nasal cannula. CBC shows leukocytosis of 18.7, hemoglobin of 15.2 CMP showed glucose of 1434, BUN of 46, creatinine of 2.17, anion gap of 26.Troponin of 18 and 22.  Initial lactate of 7.6 with improvement down to 4.1 after fluid resuscitation. PH of 7.28, Bicarb of 23.  Chest x-ray shows mild bronchitic thick changes. She was given 3L of NS bolus and started on vancomycin and cefepime for broad-spectrum antibiotic coverage with no clear source of infection.  Interim History: - 11/20: CT-head negative - 11/20: d/c'ed insulin gtt after blood sugar improved, started lantus and SSI - 11/22: Mental status has not come back to baseline.  Blood sugar still fluctuating. - 11/22: started diet, started heart/carb diet - 11/23: Procalcitonin < 0.10 and lactic acid 1.2.  No  leukocytosis.  Antibiotics was discontinued. - 11/24: consulted psychiatry. Per Dr. Maryellen Pile, "At this time, patient does not have ability to understand the details of her regimen nor administer it on a regular basis" - 11/26: Hgb dropped from 15 to 11.1 --> consulted GI 11/26   Subjective: Patient is still confused, oriented to person and place but not to time.  Denies chest pain, abdominal pain.  No active nausea vomiting, diarrhea noted. No fever or chills.   Assessment & Plan:   Principal Problem:   Hyperosmolar hyperglycemic state (HHS) ([place]) Active Problems:   Uncontrolled type 2 diabetes mellitus with stage 3 chronic kidney disease, with long-term current use of insulin (HCC)   Essential hypertension   LBBB (left bundle branch block)   Coronary artery disease, non-occlusive   Chronic systolic CHF (congestive heart failure) (HCC)   NICM (nonischemic cardiomyopathy) (HCC)   Elevated troponin   Hypernatremia   Sepsis ([place])   Acute renal failure superimposed on stage 3a chronic kidney disease ([place])   Acute metabolic encephalopathy  Hyperosmolar hyperglycemic state with coma (HHS) ([place]): Resolved. This is due to medication noncompliance. Mental status improved compired to the time on admission, but still not back to baseline. 11/20: d/c'ed insulin gtt after blood sugar improved, started lantus and SSI. Per her daughter, pt often skips her medication. She has hx of depression, family asked for psych evaluation - Lantus 20 unit bid  - SSI -resistant -->changedto home Humulin RU500 Insulin QID (with meals and at bedtime) per SSI - Consulted psychiatry, highly appreciate input  Depression: Per her daughter, pt often skips her medication. She has hx of depression, family asked for psych evaluation. Consulted psychiatry 11/24, per Dr. Zane Herald, pt does not have the ability to make medical decisions for her self. GIven her recent history of the same, Dr. Claris Gower thinks the trend  is showing worsening cognition. -prozac -highly appreciate Dr. Asher Muir recommendations. -per Dr. Claris Gower, In case of agitation please utilize Haldol 2 mg IV or Ativan 2 mg IV.    Acute metabolic encephalopathy: improving, but not back to baseline yet. CT-head negative. -Frequent neuro check -started diet, started heart/carb diet  Sepsis: resolved. Patient meets criteria for sepsis with leukocytosis, tachycardia, tachypnea on admission. Etiology is not clear. Patient has elevated lactic acid 7.6, 4.1, 2.6, 3.2, Chest x-ray showed mild bronchitis change. Urinalysis negative. COVID-19 negative. Leukocytosis resolved 10.4 on 11/26. 11/23: Procalcitonin <0.10 and lactic acid 1.2.   - vancomycin and cefepime -->Antibiotics was discontinued 11/23 -Follow-up of blood culture --> no growth so far  Acute on chronic kidney disease stage IIIa:Baseline creatinine 1.0-1.4. Her creatinine was 1.61 on admission, improved to baseline. - follow serial BMP  Suspected coffee-ground emesis:  Symptoms resolved. Hgb stable 15.2 -->13.8 -->11.7 -->13.2 --> 11.1. no active hematemesis any more.. Seen by GI in recent admission last week and they did not recommend procedure and to take Protonix for a month. No hematemesis or melena noted in hospital. Per Dr. Vicente Males, iF has melena/hematemsis or drop ofg Hb from baseline, will perform EGD. -hold ASA and plavis - Avoid NSAIDs and SQ heparin - protonix orally 40 mg bid  Chronic systolic congestive heart failure with EF 35 to 40%.BNP 207 -->303. No signs of acute exacerbation -Coreg  - restarted Aldactone, but hold Entresto for recent AoCKD-IIIa and monitor renal Fx. If renal fx good, may resume Entresto  Uncontrolled type 2 diabetes mellitus with stage 3 chronic kidney disease, with long-term current use of insulin (HCC):A1c 10.9 on 07/08/19.  - Lantus 20 unit bid  - SSI-resistant -->changed to home insulin regular human CONCENTRATED  (HUMULIN R U-500 KWIKPEN) 500 UNIT/ML kwikpen  Essential hypertension:  -started coreg 11/22 - on Aldoctone  Coronary artery disease, non-occlusive and elevated trop: Trop 19 -->22. Likely due to demand ischemia -Hold aspirin and Plavix due to possible coffee-ground emesis.  Hypernatremia:resolved -f/u by BMP    DVT prophylaxis: SCD Code Status: full Family Communication: daughter is at beside Discharge plan: Patient mental status improves, but has not come back to baseline.  Pt needs placement. Working on placement. When bed is ready, will be d/c'ed.  Consultants:   GI  Psychiatry  Procedures:  none  Antimicrobials:  Anti-infectives (From admission, onward)   Start     Dose/Rate Route Frequency Ordered Stop   08/25/19 2200  vancomycin (VANCOCIN) 1,250 mg in sodium chloride 0.9 % 250 mL IVPB  Status:  Discontinued     1,250 mg 166.7 mL/hr over 90 Minutes Intravenous Every 24 hours 08/25/19 1007 08/25/19 1400   08/24/19 2200  vancomycin (VANCOCIN) 1,250 mg in sodium chloride 0.9 % 250 mL IVPB  Status:  Discontinued     1,250 mg 166.7 mL/hr over 90 Minutes Intravenous Every 24 hours 08/24/19 2000 08/25/19 1007   08/22/19 2200  vancomycin (VANCOCIN) IVPB 750 mg/150 ml premix  Status:  Discontinued     750 mg 150 mL/hr over 60 Minutes Intravenous Every 24 hours 08/22/19 0701 08/24/19 2000   08/22/19 0900  ceFEPIme (MAXIPIME) 2 g in sodium chloride 0.9 % 100  mL IVPB  Status:  Discontinued     2 g 200 mL/hr over 30 Minutes Intravenous Every 12 hours 08/22/19 0701 08/25/19 1400   08/22/19 0140  vancomycin variable dose per unstable renal function (pharmacist dosing)  Status:  Discontinued      Does not apply See admin instructions 08/22/19 0140 08/22/19 0925   08/21/19 2000  vancomycin (VANCOCIN) 1,250 mg in sodium chloride 0.9 % 250 mL IVPB     1,250 mg 166.7 mL/hr over 90 Minutes Intravenous  Once 08/21/19 1943 08/22/19 0156   08/21/19 1945  ceFEPIme (MAXIPIME) 2 g  in sodium chloride 0.9 % 100 mL IVPB     2 g 200 mL/hr over 30 Minutes Intravenous  Once 08/21/19 1940 08/21/19 2153   08/21/19 1945  vancomycin (VANCOCIN) IVPB 1000 mg/200 mL premix  Status:  Discontinued     1,000 mg 200 mL/hr over 60 Minutes Intravenous  Once 08/21/19 1940 08/21/19 1943       Objective: Vitals:   08/27/19 2319 08/28/19 0259 08/28/19 0303 08/28/19 0810  BP: 109/62  114/71 129/76  Pulse: (!) 104  99 97  Resp:      Temp:   98.2 F (36.8 C) 98 F (36.7 C)  TempSrc:   Oral Oral  SpO2:   96% 98%  Weight:  64.1 kg    Height:        Intake/Output Summary (Last 24 hours) at 08/28/2019 1344 Last data filed at 08/28/2019 1149 Gross per 24 hour  Intake 120 ml  Output 1500 ml  Net -1380 ml   Filed Weights   08/26/19 0340 08/27/19 0257 08/28/19 0259  Weight: 63.4 kg 64.1 kg 64.1 kg    Examination:  Physical Exam: Dry mucous membrane  General: Not in acute distress HEENT: PERRL, EOMI, no scleral icterus, No JVD or bruit Cardiac: S1/S2, RRR, No murmurs, gallops or rubs Pulm:  No rales, wheezing, rhonchi or rubs. Abd: Soft, nondistended, nontender,  no organomegaly, BS present Ext: No edema. 1+DP/PT pulse bilaterally Musculoskeletal: No joint deformities, erythema, or stiffness, ROM full Skin: No rashes.  Neuro: Patient is alert, orientated to person, but not to time.  She moves all extremities normally. Psych: Patient is not psychotic, no suicidal or hemocidal ideation.    Data Reviewed: I have personally reviewed following labs and imaging studies  CBC: Recent Labs  Lab 08/21/19 1701  08/25/19 0651 08/26/19 0551 08/27/19 0535 08/28/19 0416 08/28/19 1112  WBC 18.7*   < > 9.9 11.2* 11.4* 10.4 11.6*  NEUTROABS 14.8*  --   --   --   --   --   --   HGB 15.2*   < > 13.2 13.3 11.7* 11.1* 11.7*  HCT 53.0*   < > 42.0 40.0 36.4 35.1* 37.1  MCV 100.6*   < > 90.5 85.5 89.7 90.0 90.9  PLT 319   < > 119* 133* 113* 121* 147*   < > = values in this interval  not displayed.   Basic Metabolic Panel: Recent Labs  Lab 08/23/19 0551 08/24/19 0449  08/25/19 0651 08/25/19 1216 08/26/19 0551 08/27/19 0535 08/28/19 0416  NA  --  145  --  141  --  139 140 142  K  --  3.2*  --  3.8  --  3.7 3.5 3.5  CL  --  115*  --  112*  --  110 109 110  CO2  --  20*  --  20*  --  20* 23  24  GLUCOSE  --  127*   < > 174* 485* 400* 256* 277*  BUN  --  28*  --  20  --  21 16 14   CREATININE  --  0.80  --  0.73  --  0.80 0.82 0.71  CALCIUM  --  8.3*  --  8.6*  --  9.1 8.9 8.8*  MG 2.1 1.7  --  1.8  --  1.7  --   --    < > = values in this interval not displayed.   GFR: Estimated Creatinine Clearance: 58.1 mL/min (by C-G formula based on SCr of 0.71 mg/dL). Liver Function Tests: Recent Labs  Lab 08/21/19 1701  AST 19  ALT 26  ALKPHOS 245*  BILITOT 1.3*  PROT 8.3*  ALBUMIN 3.5   No results for input(s): LIPASE, AMYLASE in the last 168 hours. No results for input(s): AMMONIA in the last 168 hours. Coagulation Profile: No results for input(s): INR, PROTIME in the last 168 hours. Cardiac Enzymes: No results for input(s): CKTOTAL, CKMB, CKMBINDEX, TROPONINI in the last 168 hours. BNP (last 3 results) No results for input(s): PROBNP in the last 8760 hours. HbA1C: No results for input(s): HGBA1C in the last 72 hours. CBG: Recent Labs  Lab 08/27/19 1130 08/27/19 1659 08/27/19 2111 08/28/19 0811 08/28/19 1146  GLUCAP 488* 292* 194* 300* 487*   Lipid Profile: No results for input(s): CHOL, HDL, LDLCALC, TRIG, CHOLHDL, LDLDIRECT in the last 72 hours. Thyroid Function Tests: No results for input(s): TSH, T4TOTAL, FREET4, T3FREE, THYROIDAB in the last 72 hours. Anemia Panel: No results for input(s): VITAMINB12, FOLATE, FERRITIN, TIBC, IRON, RETICCTPCT in the last 72 hours. Sepsis Labs: Recent Labs  Lab 08/22/19 0207 08/22/19 0809 08/22/19 1020 08/25/19 0651 08/25/19 0909  PROCALCITON  --   --   --   --  <0.10  LATICACIDVEN 3.6* 2.6* 3.2* 1.2   --     Recent Results (from the past 240 hour(s))  SARS Coronavirus 2 by RT PCR (hospital order, performed in [place] hospital lab) Nasopharyngeal Nasopharyngeal Swab     Status: None   Collection Time: 08/21/19  8:05 PM   Specimen: Nasopharyngeal Swab  Result Value Ref Range Status   SARS Coronavirus 2 NEGATIVE NEGATIVE Final    Comment: (NOTE) If result is NEGATIVE SARS-CoV-2 target nucleic acids are NOT DETECTED. The SARS-CoV-2 RNA is generally detectable in upper and lower  respiratory specimens during the acute phase of infection. The lowest  concentration of SARS-CoV-2 viral copies this assay can detect is 250  copies / mL. A negative result does not preclude SARS-CoV-2 infection  and should not be used as the sole basis for treatment or other  patient management decisions.  A negative result may occur with  improper specimen collection / handling, submission of specimen other  than nasopharyngeal swab, presence of viral mutation(s) within the  areas targeted by this assay, and inadequate number of viral copies  (<250 copies / mL). A negative result must be combined with clinical  observations, patient history, and epidemiological information. If result is POSITIVE SARS-CoV-2 target nucleic acids are DETECTED. The SARS-CoV-2 RNA is generally detectable in upper and lower  respiratory specimens dur ing the acute phase of infection.  Positive  results are indicative of active infection with SARS-CoV-2.  Clinical  correlation with patient history and other diagnostic information is  necessary to determine patient infection status.  Positive results do  not rule out bacterial infection or co-infection  with other viruses. If result is PRESUMPTIVE POSTIVE SARS-CoV-2 nucleic acids MAY BE PRESENT.   A presumptive positive result was obtained on the submitted specimen  and confirmed on repeat testing.  While 2019 novel coronavirus  (SARS-CoV-2) nucleic acids may be present in the  submitted sample  additional confirmatory testing may be necessary for epidemiological  and / or clinical management purposes  to differentiate between  SARS-CoV-2 and other Sarbecovirus currently known to infect humans.  If clinically indicated additional testing with an alternate test  methodology [phone]) is advised. The SARS-CoV-2 RNA is generally  detectable in upper and lower respiratory sp ecimens during the acute  phase of infection. The expected result is Negative. Fact Sheet for Patients:  StrictlyIdeas.no Fact Sheet for Healthcare Providers: BankingDealers.co.za This test is not yet approved or cleared by the [place] FDA and has been authorized for detection and/or diagnosis of SARS-CoV-2 by FDA under an Emergency Use Authorization (EUA).  This EUA will remain in effect (meaning this test can be used) for the duration of the COVID-19 declaration under Section 564(b)(1) of the Act, 21 U.S.C. section 360bbb-3(b)(1), unless the authorization is terminated or revoked sooner. Performed at [facility], [address] [place], [place] [address]   Blood Culture (routine x 2)     Status: None (Preliminary result)   Collection Time: 08/21/19  8:17 PM   Specimen: BLOOD  Result Value Ref Range Status   Specimen Description BLOOD BLOOD LEFT HAND  Final   Special Requests   Final    BOTTLES DRAWN AEROBIC AND ANAEROBIC Blood Culture adequate volume   Culture   Final    NO GROWTH 4 DAYS Performed at [facility], [address] [place], [place] [address]    Report Status PENDING  Incomplete  Blood Culture (routine x 2)     Status: None (Preliminary result)   Collection Time: 08/21/19  8:18 PM   Specimen: BLOOD  Result Value Ref Range Status   Specimen Description BLOOD RIGHT ANTECUBITAL  Final   Special Requests   Final    BOTTLES DRAWN AEROBIC AND ANAEROBIC Blood Culture adequate volume   Culture   Final     NO GROWTH 4 DAYS Performed at [facility], [address], [name], [place] [address]    Report Status PENDING  Incomplete    Radiology Studies: No results found.      Scheduled Meds: . carvedilol  12.5 mg Oral BID  . Chlorhexidine Gluconate Cloth  6 each Topical Daily  . FLUoxetine  20 mg Oral Daily  . insulin aspart  0-5 Units Subcutaneous QHS  . insulin regular human CONCENTRATED  20-100 Units Subcutaneous TID WC  . nystatin ointment   Topical BID  . pantoprazole  40 mg Oral BID   Continuous Infusions:    LOS: 6 days    Time spent: 30 min     Ivor Costa, DO [facility] PAGER is on [place]  If 7PM-7AM, please contact night-coverage [email or web address] Password [email or web address] 08/28/2019, 1:44 PM

## 2019-08-28 NOTE — Consult Note (Signed)
Jonathon Bellows , MD [address], [place], [place], [place], [address] [address], [place], [place], [place], [address] Phone: [phone]  Fax: [phone]  Consultation  Referring Provider:     Dr Blaine Hamper Primary Care Physician:  Owens Loffler, MD Primary Gastroenterologist:  Dr. Carlean Purl         Reason for Consultation:     GI bleed   Date of Admission:  08/21/2019 Date of Consultation:  08/28/2019         HPI:   Kristy [name] is a 68 y.o. female with a history of nonischemic cardiomyopathy, heart failure, Crohn's disease, type 2 diabetes presented to the hospital on 08/22/2019 with altered mental status and hypoglycemia.  Prior history of diabetic ketoacidosis.  Also treated for sepsis and had some coffee-ground emesis.  She was in fact seen by Dr. Evaristo Bury about 3 weeks back for coffee-ground emesis, since hemoglobin was stable no intervention was performed.  Hemoglobin dropped overnight from 15 to 11.1 g. Has been on Plavix.  Hemoglobin has been stable at 11.1 g with the past 2 days.  Was 11.7 g 4 days back.  In association the creatinine is also trended down over the last 2 days.  No elevation in BUN/creatinine ratio.   Patient denies any hematemesis , melena , also checked with nursing and says no melena or blood loss seen to explain 4 grams drop in HB. Patient denies any pain or discomfort . She is a bit confused with giving history   CBC Latest Ref Rng & Units 08/28/2019 08/27/2019 08/26/2019  WBC 4.0 - 10.5 K/uL 10.4 11.4(H) 11.2(H)  Hemoglobin 12.0 - 15.0 g/dL 11.1(L) 11.7(L) 13.3  Hematocrit 36.0 - 46.0 % 35.1(L) 36.4 40.0  Platelets 150 - 400 K/uL 121(L) 113(L) 133(L)    Past Medical History:  Diagnosis Date  . Allergic rhinitis   . Allergy   . Cataract    mild   . Crohn's disease ([place]) 10/13/2013  . Diverticulosis   . GERD (gastroesophageal reflux disease)   . Gout   . HFrEF (heart failure with reduced ejection fraction) ([place])    a. 12/2008 Cath: EF  45% w/ inf HK; b. 07/2009 Echo: EF 50-55%; c. 08/2018 Echo: EF 20-25%.  Marland Kitchen Hyperlipidemia   . Hypertension   . IBS (irritable bowel syndrome)   . Left bundle branch block   . Neuromuscular disorder (HCC)    neuropathy  . NICM (nonischemic cardiomyopathy) ([place])    a. 12/2008 Cath: no significant dzs, EF 45% w/ inf HK->Med Rx; b. 07/2009 Echo: EF 50-55%; c. 08/2018 Echo: EF 20-25%, ant/antsept HK, mild MR, mildly dil LA, nl RV fx; d. 08/2018 Cath: D1 80, otw nonobs dzs->Med rx.  . Non-obstructive CAD (coronary artery disease)    a. 12/2008 Cath: no significant dzs, EF 45% w/ inf HK->Med Rx; b. 08/2018 Cath: LM nl, LAD min irregs, D1 80, LCX 20md, RCA min irregs->Med Rx.  . Osteoarthritis   . Poorly controlled Diabetes mellitus    a. 07/2018 A1c 13.1.  .Marland KitchenSymptomatic cholelithiasis   . Uncontrolled type 2 diabetes mellitus with stage 3 chronic kidney disease, with long-term current use of insulin ([place]          Past Surgical History:  Procedure Laterality Date  . BREAST BIOPSY Right 06/24/2019   stereo bx/ x clip/ path pending  . BREAST CYST ASPIRATION    . COLONOSCOPY    . DILATION AND CURETTAGE OF UTERUS    .  LEFT HEART CATH AND CORONARY ANGIOGRAPHY N/A 04/24/2019   Procedure: LEFT HEART CATH AND CORONARY ANGIOGRAPHY;  Surgeon: Nelva Bush, MD;  Location: [facility];  Service: Cardiovascular;  Laterality: N/A;  . RIGHT/LEFT HEART CATH AND CORONARY ANGIOGRAPHY N/A 08/26/2018   Procedure: RIGHT/LEFT HEART CATH AND CORONARY ANGIOGRAPHY;  Surgeon: Wellington Hampshire, MD;  Location: [facility];  Service: Cardiovascular;  Laterality: N/A;  . SHOULDER SURGERY     15 + yrs ago   . SKIN SURGERY     nose   . VAGINAL HYSTERECTOMY      Prior to Admission medications   Medication Sig Start Date End Date Taking? Authorizing Provider  acetaminophen (TYLENOL) 325 MG tablet Take 325-650 mg by mouth daily as needed for moderate pain or headache.    Yes [provider]   aspirin EC 81 MG EC tablet Take 1 tablet (81 mg total) by mouth daily. 04/29/19  Yes Sudini, Alveta Heimlich, MD  carvedilol (COREG) 12.5 MG tablet Take 1 tablet (12.5 mg total) by mouth 2 (two) times daily. 06/11/19 09/09/19 Yes Minna Merritts, MD  clopidogrel (PLAVIX) 75 MG tablet Take 1 tablet (75 mg total) by mouth daily with breakfast. 06/11/19  Yes Gollan, Kathlene November, MD  Ensure (ENSURE) Take 237 mLs by mouth daily.   Yes [provider]  ENTRESTO 49-51 MG TAKE ONE TABLET TWICE DAILY STARTING SUNDAY 09/15/18 Patient taking differently: Take 1 tablet by mouth 2 (two) times daily.  05/05/19  Yes Minna Merritts, MD  FLUoxetine (PROZAC) 20 MG capsule Take 1 capsule (20 mg total) by mouth daily. 02/12/19  Yes Copland, [name], MD  glucose blood (ONETOUCH ULTRA) test strip Use to check blood sugar up to 8 times a day. 06/25/19  Yes Copland, [name], MD  insulin regular human CONCENTRATED (HUMULIN R U-500 KWIKPEN) 500 UNIT/ML kwikpen Inject 50-100 Units into the skin 4 (four) times daily -  before meals and at bedtime. Give 0 units insulin for blood sugar < 200  Give 20 units insulin for blood sugar 201 - 300 Give 50 units insulin for blood sugar 301 - 400 Give 90 units insulin for blood sugar 401 - 500 Give 100 units insulin for blood sugar > 500 . 07/14/19 11/11/19 Yes Gladstone Lighter, MD  Insulin Syringe-Needle U-100 (B-D INS SYRINGE 0.5CC/31GX5/16) 31G X 5/16" 0.5 ML MISC USE AS DIRECTED THREE TIMES A DAY 08/06/17  Yes Copland, [name], MD  ivabradine (CORLANOR) 5 MG TABS tablet TAKE ONE TABLET TWICE DAILY WITH MEALS 08/11/19  Yes Theora Gianotti, NP  spironolactone (ALDACTONE) 25 MG tablet Take 1 tablet (25 mg total) by mouth daily. 08/04/19  Yes Gollan, Kathlene November, MD  pantoprazole (PROTONIX) 40 MG tablet Take 1 tablet (40 mg total) by mouth daily. Patient not taking: Reported on 08/18/2019 08/16/19   Loletha Grayer, MD    Family History  Problem Relation Age of Onset  . Kidney  failure Mother   . Hypertension Father   . Kidney failure Brother   . Colon cancer Neg Hx   . Colon polyps Neg Hx   . Rectal cancer Neg Hx   . Stomach cancer Neg Hx   . Breast cancer Neg Hx      Social History   Tobacco Use  . Smoking status: Former Smoker    Packs/day: 0.75    Years: 30.00    Pack years: 22.50    Types: Cigarettes    Quit date: 06/06/1997  Years since quitting: 22.2  . Smokeless tobacco: Never Used  Substance Use Topics  . Alcohol use: No  . Drug use: No    Allergies as of 08/21/2019 - Review Complete 08/21/2019  Allergen Reaction Noted  . Fish oil Other (See Comments) 03/11/2015  . Glimepiride Other (See Comments) 10/31/2006  . Guanfacine hcl Other (See Comments) 10/31/2006  . Rosiglitazone Other (See Comments) 03/11/2015   Weight change: 0 kg  Review of Systems:    All systems reviewed and negative except where noted in HPI.   Physical Exam:  Vital signs in last 24 hours: Temp:  [98 F (36.7 C)-98.7 F (37.1 C)] 98 F (36.7 C) (11/26 0810) Pulse Rate:  [96-104] 97 (11/26 0810) Resp:  [18] 18 (11/25 1538) BP: (108-129)/(55-76) 129/76 (11/26 0810) SpO2:  [96 %-98 %] 98 % (11/26 0810) Weight:  [64.1 kg] 64.1 kg (11/26 0259) Last BM Date: 08/25/19 General:   Pleasant, cooperative in NAD Head:  Normocephalic and atraumatic. Eyes:   No icterus.   Conjunctiva pink. PERRLA. Ears:  Normal auditory acuity. Neck:  Supple; no masses or thyroidomegaly Lungs: Respirations even and unlabored. Lungs clear to auscultation bilaterally.   No wheezes, crackles, or rhonchi.  Heart:  Regular rate and rhythm;  Without murmur, clicks, rubs or gallops Abdomen:  Soft, nondistended, nontender. Normal bowel sounds. No appreciable masses or hepatomegaly.  No rebound or guarding.  Neurologic:  Alert and oriented x2;  grossly normal neurologically. Skin:  Intact without significant lesions or rashes. Cervical Nodes:  No significant cervical adenopathy. Psych:  Alert  and cooperative. Normal affect.  LAB RESULTS: Recent Labs    08/26/19 0551 08/27/19 0535 08/28/19 0416  WBC 11.2* 11.4* 10.4  HGB 13.3 11.7* 11.1*  HCT 40.0 36.4 35.1*  PLT 133* 113* 121*   BMET Recent Labs    08/26/19 0551 08/27/19 0535 08/28/19 0416  NA 139 140 142  K 3.7 3.5 3.5  CL 110 109 110  CO2 20* 23 24  GLUCOSE 400* 256* 277*  BUN 21 16 14   CREATININE 0.80 0.82 0.71  CALCIUM 9.1 8.9 8.8*  I/O last 3 completed shifts: In: -  Out: 1400 [Urine:1400]  LFT No results for input(s): PROT, ALBUMIN, AST, ALT, ALKPHOS, BILITOT, BILIDIR, IBILI in the last 72 hours. PT/INR No results for input(s): LABPROT, INR in the last 72 hours.  STUDIES: No results found.    Impression / Plan:   Kristy [name] is a 68 y.o. y/o female poorly controlled diabetes admitted with hypoglycemia altered mental status.  Some history of coffee-ground emesis.  I have been called for drop in hemoglobin.  Hemoglobin was 11.7 g 3 days back.  Then went up to 13 g with associated rise in creatinine and again has dropped back to the baseline seen 3 days back.  Plavix has been held since admission on 08/22/2019.  No one has seen any hematemesis or melena in the hospital.Hb dropped today likely due to hemodilution from fluids received last few days. IF has melena/hematemsis or drop ofg Hb from baseline will perform EGD. D/c stool testing as it is a test for colon cancer screening and does not rule in or out a GI bleed.   I will follow with the patient.   Thank you for involving me in the care of this patient.      LOS: 6 days   Jonathon Bellows, MD  08/28/2019, 11:03 AM

## 2019-08-28 NOTE — Consult Note (Addendum)
[facility] Consult   Reason for Consult: altered mental status Referring Physician:   Patient Identification: Kristy [name] MRN:  [id number] Principal Diagnosis: Hyperosmolar hyperglycemic state (HHS) ([place]) Diagnosis:  Principal Problem:   Hyperosmolar hyperglycemic state (HHS) ([facility]) Active Problems:   Uncontrolled type 2 diabetes mellitus with stage 3 chronic kidney disease, with long-term current use of insulin ([place])   Essential hypertension   LBBB (left bundle branch block)   Coronary artery disease, non-occlusive   Chronic systolic CHF (congestive heart failure) (HCC)   NICM (nonischemic cardiomyopathy) (HCC)   Elevated troponin   Hypernatremia   Sepsis ([place])   Acute renal failure superimposed on stage 3a chronic kidney disease ([facility])   Acute metabolic encephalopathy   Total Time spent with patient: 20 minutes  Subjective:   Kristy [name] is a 68 y.o. female patient admitted with altered mental status.  HPI:  As per Psych Consult note by Dr.Cristofano from 08/26/2019.  Patient seen.  Chart reviewed. Patient discussed with nursing; no overnight events reported.  SUBJECTIVE: Patient seen in her room. She was watching TV. Patient is calm, cooperative and hypertalklative. She reports feeling "okay, but I wish I would be at home for Thanksgiving". She reports feeling home-sick. She denies feeling depressed. She reports "some" anxiety related to unclear discharge plan. She tells that she came to the hospital because her "stepdaughter sent me". She is fixated on relationships with step-daughter who, patient thinks, overly-controls patient`s life. Patient knows that she was admitted with high blood glucose level, but appears do not understand the sevirity of the problem and says she thinks the admission was not necessary. She says she can continue live alone and manage her medications. She is upset due to the possibility of discharge to skilled nursing facility.  She denies thoughts of harming self or others. Denies any hallucinations. Reports good sleep and appetite. Denies current physical complaints, except of forearm soreness in a place of IV infusion.     Patient evaluated by OT yesterday, per OT Note: "Currently pt demonstrates impairments in cognition and mild balance deficits requiring supervision for safety.Pt near baseline functional status, but continues to be limited by cognitive deficits.Upon hospital discharge, recommend pt discharge with 24/7 supervision/asisst. Would strongly recommend considering ALF for longer term solution to minimize risk of readmission. "   Past Psychiatric History: see initial Psych Consult note from 11/24  Risk to Self:  No Risk to Others:  No Prior Inpatient Therapy:  No Prior Outpatient Therapy:  No  Past Medical History:  Past Medical History:  Diagnosis Date  . Allergic rhinitis   . Allergy   . Cataract    mild   . Crohn's disease ([facility]) 10/13/2013  . Diverticulosis   . GERD (gastroesophageal reflux disease)   . Gout   . HFrEF (heart failure with reduced ejection fraction) ([place])    a. 12/2008 Cath: EF 45% w/ inf HK; b. 07/2009 Echo: EF 50-55%; c. 08/2018 Echo: EF 20-25%.  Marland Kitchen Hyperlipidemia   . Hypertension   . IBS (irritable bowel syndrome)   . Left bundle branch block   . Neuromuscular disorder (HCC)    neuropathy  . NICM (nonischemic cardiomyopathy) ([place])    a. 12/2008 Cath: no significant dzs, EF 45% w/ inf HK->Med Rx; b. 07/2009 Echo: EF 50-55%; c. 08/2018 Echo: EF 20-25%, ant/antsept HK, mild MR, mildly dil LA, nl RV fx; d. 08/2018 Cath: D1 80, otw nonobs dzs->Med rx.  . Non-obstructive CAD (coronary artery disease)  a. 12/2008 Cath: no significant dzs, EF 45% w/ inf HK->Med Rx; b. 08/2018 Cath: LM nl, LAD min irregs, D1 80, LCX 29md, RCA min irregs->Med Rx.  . Osteoarthritis   . Poorly controlled Diabetes mellitus    a. 07/2018 A1c 13.1.  .Marland KitchenSymptomatic cholelithiasis   . Uncontrolled  type 2 diabetes mellitus with stage 3 chronic kidney disease, with long-term current use of insulin ([place]          Past Surgical History:  Procedure Laterality Date  . BREAST BIOPSY Right 06/24/2019   stereo bx/ x clip/ path pending  . BREAST CYST ASPIRATION    . COLONOSCOPY    . DILATION AND CURETTAGE OF UTERUS    . LEFT HEART CATH AND CORONARY ANGIOGRAPHY N/A 04/24/2019   Procedure: LEFT HEART CATH AND CORONARY ANGIOGRAPHY;  Surgeon: ENelva Bush MD;  [facility];  Service: Cardiovascular;  Laterality: N/A;  . RIGHT/LEFT HEART CATH AND CORONARY ANGIOGRAPHY N/A 08/26/2018   Procedure: RIGHT/LEFT HEART CATH AND CORONARY ANGIOGRAPHY;  Surgeon: AWellington Hampshire MD;  [facility];  Service: Cardiovascular;  Laterality: N/A;  . SHOULDER SURGERY     15 + yrs ago   . SKIN SURGERY     nose   . VAGINAL HYSTERECTOMY     Family History:  Family History  Problem Relation Age of Onset  . Kidney failure Mother   . Hypertension Father   . Kidney failure Brother   . Colon cancer Neg Hx   . Colon polyps Neg Hx   . Rectal cancer Neg Hx   . Stomach cancer Neg Hx   . Breast cancer Neg Hx    Family Psychiatric  History: see initial Psych Consult Note from 11/24 Social History:  Social History   Substance and Sexual Activity  Alcohol Use No     Social History   Substance and Sexual Activity  Drug Use No    Social History   Socioeconomic History  . Marital status: Widowed    Spouse name: Not on file  . Number of children: Not on file  . Years of education: Not on file  . Highest education level: Not on file  Occupational History  . Not on file  Social Needs  . Financial resource strain: Not on file  . Food insecurity    Worry: Not on file    Inability: Not on file  . Transportation needs    Medical: Not on file    Non-medical: Not on file  Tobacco Use  . Smoking status: Former Smoker    Packs/day: 0.75    Years: 30.00    Pack  years: 22.50    Types: Cigarettes    Quit date: 06/06/1997    Years since quitting: 22.2  . Smokeless tobacco: Never Used  Substance and Sexual Activity  . Alcohol use: No  . Drug use: No  . Sexual activity: Not on file  Lifestyle  . Physical activity    Days per week: Not on file    Minutes per session: Not on file  . Stress: Not on file  Relationships  . Social [place] phone: Not on file    Gets together: Not on file    Attends religious service: Not on file    Active member of club or organization: Not on file    Attends meetings of clubs or organizations: Not on file    Relationship status: Not on file  Other Topics Concern  . Not on file  Social History Narrative  . Not on file   Additional Social History:    Allergies:   Allergies  Allergen Reactions  . Fish Oil Other (See Comments)    Gout  . Glimepiride Other (See Comments)    REACTION: hypoglycemia  . Guanfacine Hcl Other (See Comments)    REACTION: unspecified  . Rosiglitazone Other (See Comments)    CHF    Labs:  Results for orders placed or performed during the hospital encounter of 08/21/19 (from the past 48 hour(s))  Glucose, capillary     Status: Abnormal   Collection Time: 08/26/19 11:38 AM  Result Value Ref Range   Glucose-Capillary 460 (H) 70 - 99 mg/dL  Glucose, capillary     Status: Abnormal   Collection Time: 08/26/19  2:26 PM  Result Value Ref Range   Glucose-Capillary 363 (H) 70 - 99 mg/dL  Glucose, capillary     Status: Abnormal   Collection Time: 08/26/19  4:47 PM  Result Value Ref Range   Glucose-Capillary 174 (H) 70 - 99 mg/dL  Glucose, capillary     Status: None   Collection Time: 08/26/19  9:08 PM  Result Value Ref Range   Glucose-Capillary 71 70 - 99 mg/dL   Comment 1 Notify RN    Comment 2 Document in Chart   Glucose, capillary     Status: Abnormal   Collection Time: 08/27/19  3:16 AM  Result Value Ref Range   Glucose-Capillary 193 (H) 70 - 99 mg/dL  AM -  CBC     Status: Abnormal   Collection Time: 08/27/19  5:35 AM  Result Value Ref Range   WBC 11.4 (H) 4.0 - 10.5 K/uL   RBC 4.06 3.87 - 5.11 MIL/uL   Hemoglobin 11.7 (L) 12.0 - 15.0 g/dL   HCT 36.4 36.0 - 46.0 %   MCV 89.7 80.0 - 100.0 fL   MCH 28.8 26.0 - 34.0 pg   MCHC 32.1 30.0 - 36.0 g/dL   RDW 13.4 11.5 - 15.5 %   Platelets 113 (L) 150 - 400 K/uL    Comment: Immature Platelet Fraction may be clinically indicated, consider ordering this additional test [id number]    nRBC 0.0 0.0 - 0.2 %    Comment: Performed at [facility], [address] [place], [place] [address]  AM - BMET     Status: Abnormal   Collection Time: 08/27/19  5:35 AM  Result Value Ref Range   Sodium 140 135 - 145 mmol/L   Potassium 3.5 3.5 - 5.1 mmol/L   Chloride 109 98 - 111 mmol/L   CO2 23 22 - 32 mmol/L   Glucose, Bld 256 (H) 70 - 99 mg/dL   BUN 16 8 - 23 mg/dL   Creatinine, Ser 0.82 0.44 - 1.00 mg/dL   Calcium 8.9 8.9 - 10.3 mg/dL   GFR calc non Af Amer >60 >60 mL/min   GFR calc Af Amer >60 >60 mL/min   Anion gap 8 5 - 15    Comment: Performed at [facility], [address] [place], [place] [address]  Glucose, capillary     Status: Abnormal   Collection Time: 08/27/19  7:38 AM  Result Value Ref Range   Glucose-Capillary 254 (H) 70 - 99 mg/dL  Glucose, capillary     Status: Abnormal   Collection Time: 08/27/19 11:30 AM  Result Value Ref Range   Glucose-Capillary 488 (H) 70 - 99  mg/dL  Glucose, capillary     Status: Abnormal   Collection Time: 08/27/19  4:59 PM  Result Value Ref Range   Glucose-Capillary 292 (H) 70 - 99 mg/dL  Glucose, capillary     Status: Abnormal   Collection Time: 08/27/19  9:11 PM  Result Value Ref Range   Glucose-Capillary 194 (H) 70 - 99 mg/dL  AM - CBC     Status: Abnormal   Collection Time: 08/28/19  4:16 AM  Result Value Ref Range   WBC 10.4 4.0 - 10.5 K/uL   RBC 3.90 3.87 - 5.11 MIL/uL   Hemoglobin 11.1 (L) 12.0 - 15.0 g/dL   HCT 35.1 (L)  36.0 - 46.0 %   MCV 90.0 80.0 - 100.0 fL   MCH 28.5 26.0 - 34.0 pg   MCHC 31.6 30.0 - 36.0 g/dL   RDW 13.6 11.5 - 15.5 %   Platelets 121 (L) 150 - 400 K/uL   nRBC 0.0 0.0 - 0.2 %    Comment: Performed at [facility], [address] [place], [place] [address]  AM - BMET     Status: Abnormal   Collection Time: 08/28/19  4:16 AM  Result Value Ref Range   Sodium 142 135 - 145 mmol/L   Potassium 3.5 3.5 - 5.1 mmol/L   Chloride 110 98 - 111 mmol/L   CO2 24 22 - 32 mmol/L   Glucose, Bld 277 (H) 70 - 99 mg/dL   BUN 14 8 - 23 mg/dL   Creatinine, Ser 0.71 0.44 - 1.00 mg/dL   Calcium 8.8 (L) 8.9 - 10.3 mg/dL   GFR calc non Af Amer >60 >60 mL/min   GFR calc Af Amer >60 >60 mL/min   Anion gap 8 5 - 15    Comment: Performed at [facility], [address] [place], [place] [address]  Glucose, capillary     Status: Abnormal   Collection Time: 08/28/19  8:11 AM  Result Value Ref Range   Glucose-Capillary 300 (H) 70 - 99 mg/dL   Comment 1 Notify RN    Comment 2 Document in Chart     Current Facility-Administered Medications  Medication Dose Route Frequency Provider Last Rate Last Dose  . carvedilol (COREG) tablet 12.5 mg  12.5 mg Oral BID Ivor Costa, MD   12.5 mg at 08/27/19 2317  . Chlorhexidine Gluconate Cloth 2 % PADS 6 each  6 each Topical Daily Ivor Costa, MD   6 each at 08/27/19 [phone]  . dextrose 50 % solution 0-50 mL  0-50 mL Intravenous PRN Ivor Costa, MD      . FLUoxetine (PROZAC) capsule 20 mg  20 mg Oral Daily Ivor Costa, MD   20 mg at 08/27/19 0950  . insulin aspart (novoLOG) injection 0-5 Units  0-5 Units Subcutaneous QHS Ivor Costa, MD      . insulin regular human CONCENTRATED (HUMULIN R) 500 UNIT/ML kwikpen 20-100 Units  20-100 Units Subcutaneous TID WC Ivor Costa, MD   20 Units at 08/28/19 0911  . nystatin ointment (MYCOSTATIN)   Topical BID Ivor Costa, MD      . pantoprazole (PROTONIX) EC tablet 40 mg  40 mg Oral Daily Ivor Costa, MD   40 mg at 08/27/19  3532    Musculoskeletal: Strength & Muscle Tone: within normal limits Gait & Station: unsteady Patient leans: N/A  Psychiatric Specialty Exam: Physical Exam  ROS  Blood pressure 129/76, pulse 97, temperature 98 F (36.7 C), temperature source Oral,  resp. rate 18, height 5' 4"  (1.626 m), weight 64.1 kg, SpO2 98 %.Body mass index is 24.25 kg/m.  General Appearance: decreased grooming  Eye Contact:  Good  Speech:  Clear and Coherent  Volume:  Normal  Mood:  Euthymic  Affect:  Appropriate  Thought Process:  Coherent and Disorganized  Orientation:  Full (Time, Place, and Person)  Thought Content:  Illogical, Obsessions, Rumination and Tangential  Suicidal Thoughts:  No  Homicidal Thoughts:  No  Memory:  Recent;   Poor  Judgement:  Poor  Insight:  Shallow  Psychomotor Activity:  NA  Concentration:  Concentration: Fair  Recall:  [employer] of Knowledge:  Fair  Language:  Fair  Akathisia:  No  Handed:  Right  AIMS (if indicated):     Assets:  Communication Skills Desire for Improvement Social Support  ADL's:  Intact  Cognition:  Impaired,  Mild  Sleep:       ASSESSMENT: 68 year old woman with history of multiple medical issues, including diabetes , who was hospitalized due to her inability to maintain her own blood glucose level. Psychiatry consulted for altered mental status and to assess capacity to make medical decisions. Seen today for psych follow-up.  Patient still appears not able to understand the severity of her medical condition and details of her regimen, so she is still lacking capacity to make medical decisions and is unable to take proper care of herself. Patient will need help on a daily basis to assist with medication delivery and adherence and will require assisted living facility placement.  Impression: Neurocognitive disorder Unspecified anxiety disorder  Recommendations: -patient does not meet criteria for inpatient psych admission at this time; -ok  to continue Prozac 15m PO daily for anxiety; -can use Haldol 265mPO/IM Q8h PRN agitation. -supportive therapy provided.  Treatment Plan Summary: Daily contact with patient to assess and evaluate symptoms and progress in treatment  Disposition: No evidence of imminent risk to self or others at present.   Patient does not meet criteria for psychiatric inpatient admission. Supportive therapy provided about ongoing stressors.   AlLarita FifeMD 08/28/2019 9:25 AM

## 2019-08-29 DIAGNOSIS — D696 Thrombocytopenia, unspecified: Secondary | ICD-10-CM

## 2019-08-29 DIAGNOSIS — D649 Anemia, unspecified: Secondary | ICD-10-CM | POA: Diagnosis not present

## 2019-08-29 LAB — GLUCOSE, CAPILLARY
Glucose-Capillary: 363 mg/dL — ABNORMAL HIGH (ref 70–99)
Glucose-Capillary: 353 mg/dL — ABNORMAL HIGH (ref 70–99)
Glucose-Capillary: 294 mg/dL — ABNORMAL HIGH (ref 70–99)
Glucose-Capillary: 142 mg/dL — ABNORMAL HIGH (ref 70–99)

## 2019-08-29 LAB — CBC
HCT: 33.4 % — ABNORMAL LOW (ref 36.0–46.0)
Hemoglobin: 11.1 g/dL — ABNORMAL LOW (ref 12.0–15.0)
MCH: 28.6 pg (ref 26.0–34.0)
MCHC: 33.2 g/dL (ref 30.0–36.0)
MCV: 86.1 fL (ref 80.0–100.0)
Platelets: 137 10*3/uL — ABNORMAL LOW (ref 150–400)
RBC: 3.88 MIL/uL (ref 3.87–5.11)
RDW: 13.6 % (ref 11.5–15.5)
WBC: 10 10*3/uL (ref 4.0–10.5)
nRBC: 0 % (ref 0.0–0.2)

## 2019-08-29 MED ORDER — CLOPIDOGREL BISULFATE 75 MG PO TABS
75.0000 mg | ORAL_TABLET | Freq: Every day | ORAL | Status: DC
  Administered 2019-08-30 – 2019-09-23 (×25): 75 mg via ORAL
  Filled 2019-08-29 (×25): qty 1

## 2019-08-29 MED ORDER — TUBERCULIN PPD 5 UNIT/0.1ML ID SOLN
5.0000 [IU] | Freq: Once | INTRADERMAL | Status: AC
  Administered 2019-08-29: 5 [IU] via INTRADERMAL
  Filled 2019-08-29: qty 0.1

## 2019-08-29 MED ORDER — ASPIRIN EC 81 MG PO TBEC
81.0000 mg | DELAYED_RELEASE_TABLET | Freq: Every day | ORAL | Status: DC
  Administered 2019-08-29 – 2019-09-23 (×26): 81 mg via ORAL
  Filled 2019-08-29 (×27): qty 1

## 2019-08-29 MED ORDER — TUBERCULIN PPD 5 UNIT/0.1ML ID SOLN: 5.0000 [IU] | Freq: Once | INTRADERMAL | Status: DC

## 2019-08-29 NOTE — Progress Notes (Signed)
PROGRESS NOTE  Kristy [name] ZOX:[id number] DOB: [birth year] DOA: 08/21/2019 PCP: Owens Loffler, MD  Brief History   68 year old woman PMH includes systolic CHF, Crohn's disease, chronic kidney disease, diabetes mellitus type 2 presented with altered mental status, hyperglycemia.  Glucose 1434 in ED.  Recently discharged 11/11 with DKA.  Reportedly noncompliant with medications.  Was admitted for acute encephalopathy secondary to hyperglycemic hyperosmolar state, concern for sepsis, acute kidney injury, possible coffee-ground emesis, chronic systolic CHF.  Was quickly transitioned off IV insulin.  Sepsis was ruled out and antibiotics were discontinued.  Noted to have confusion, impulsiveness.  Seen by psychiatry, patient felt to not have capacity.  A & P  Diabetes mellitus type 2, uncontrolled, with CKD stage III, hemoglobin A1c 10.9 --Labile blood sugars.  May need to increase concentrated insulin.  Will reevaluate in a.m.  Anemia with drop in hemoglobin.  Previous history of coffee-ground emesis, not this admission.  No reported hematemesis or melena this admission.  Hemoglobin dropp thought to be secondary to hemodilution. --Gastroenterology followed up today.  Given no melena or blood loss, as well as stable hemoglobin, GI recommended no further evaluation. --Hemoglobin remained stable. --Resume aspirin and Plavix  Thrombocytopenia, intermittent.  Seen earlier in November as well as in July 2020. --Appears stable.  No further inpatient evaluation suggested.  Follow-up as an outpatient.  Acute metabolic encephalopathy, cognitive decline, depression, lack of capacity.  Per daughter patient often skips medication.  Family requested psychiatry evaluation. --Seen by psychiatry with recommendation to continue Prozac, use Haldol or Ativan as needed.  Per psychiatry, patient not able to understand the details of her insulin regimen nor administered on a regular basis.,  Will need help on a  daily basis to assist with medication delivery and adherence.  Worsening cognition noted. --Seen in follow-up by psychiatry 11/26, impression was neurocognitive disorder and unspecified anxiety disorder.  Patient does not appear to understand severity of medical condition and is still lacking in capacity to make medical decisions and unable to take care of herself.  Chronic systolic CHF, LVEF 81-19%. Ivabradine held for AKI. --Continue carvedilol, Aldactone.  Resume Entresto when blood pressure can tolerate.   PPD placed today 11/27  Resolved Hospital Problem list    Hyperglycemic hyperosmolar state associated acute metabolic encephalopathy.  Thought secondary to noncompliance.  Per documentation, daughter reported patient often skips medication.  Sepsis considered on admission, subsequently ruled out.  Covid negative. Urinalysis negative.  Chest x-ray mild bronchitis.  Procalcitonin negative.  Lactic acid resolved with hydration.  Blood cultures no growth to date.  Insignificant elevation of hyper sensitivity troponin.  No further evaluation suggested.  Acute kidney injury superimposed on CKD stage III   DVT prophylaxis: SCds Code Status: Full Family Communication: none Disposition Plan: SNF   Murray Hodgkins, MD  [facility] Direct contact: see [email or web address] (further directions at bottom of note if needed) 7PM-7AM contact night coverage as at bottom of note 08/29/2019, 4:40 PM  LOS: 7 days   Significant Hospital Events   . 11/19 admitted for hyperosmolar hyperglycemic state with acute metabolic encephalopathy and acute kidney injury . 11/20 off insulin infusion . 11/24, 11/26 psychiatry consultation.  Patient felt to not have capacity to care for herself medically.  SNF pursued. . 11/26 hemoglobin dropped from 15-11.1.  GI consulted.   Consults:  . Psychiatry . Gastroenterology   Procedures:  .   Significant Diagnostic Tests:  Marland Kitchen    Micro Data:  . Blood cultures no  growth to date  Antimicrobials:  .   Interval History/Subjective  No complaints today.  Feels okay.  Tolerating diet.  Objective   Vitals:  Vitals:   08/29/19 0730 08/29/19 1504  BP: (!) 122/57 (!) 133/55  Pulse: 86 93  Resp: 20 19  Temp: 99.1 F (37.3 C) 98.9 F (37.2 C)  SpO2: 97% 100%    Exam:  Constitutional.  Appears calm, comfortable. Respiratory.  Clear to auscultation bilaterally.  No wheezes, rales or rhonchi.  Normal respiratory effort. Cardiovascular.  Regular rate and rhythm.  No murmur, rub or gallop. Psychiatric.  Speech fluent and clear.  I have personally reviewed the following:   Today's Data  . CBG labile. . Hemoglobin stable 11.1.  Platelets stable at 137.  Scheduled Meds: . aspirin  81 mg Oral Daily  . carvedilol  12.5 mg Oral BID  . Chlorhexidine Gluconate Cloth  6 each Topical Daily  . [START ON 08/30/2019] clopidogrel  75 mg Oral Q breakfast  . FLUoxetine  20 mg Oral Daily  . insulin aspart  0-5 Units Subcutaneous QHS  . insulin regular human CONCENTRATED  20-100 Units Subcutaneous TID WC  . nystatin ointment   Topical BID  . pantoprazole  40 mg Oral BID  . spironolactone  25 mg Oral Daily  . tuberculin  5 Units Intradermal Once   Continuous Infusions:  Principal Problem:   Uncontrolled type 2 diabetes mellitus with stage 3 chronic kidney disease, with long-term current use of insulin (HCC) Active Problems:   Essential hypertension   LBBB (left bundle branch block)   Coronary artery disease, non-occlusive   Chronic systolic CHF (congestive heart failure) (HCC)   NICM (nonischemic cardiomyopathy) (HCC)   Elevated troponin   Sepsis ([place])   Acute metabolic encephalopathy   Thrombocytopenia (HCC)   LOS: 7 days   How to contact the [facility] Attending or Consulting provider 7A - 7P or covering provider during after hours [place], for this patient?  1. Check the care team in [facility] and look for a) attending/consulting TRH provider listed and  b) the [facility] team listed 2. Log into [email or web address] and use [place]'s universal password to access. If you do not have the password, please contact the hospital operator. 3. Locate the [facility] provider you are looking for under [facility] and page to a number that you can be directly reached. 4. If you still have difficulty reaching the provider, please page the [facility] (Director on Call) for the Hospitalists listed on amion for assistance.

## 2019-08-29 NOTE — Progress Notes (Signed)
Note entered in error.  Leitha Bleak, PT 08/29/19, 3:18 PM

## 2019-08-29 NOTE — Plan of Care (Signed)
  Problem: Clinical Measurements: Goal: Diagnostic test results will improve Outcome: Progressing   Problem: Coping: Goal: Level of anxiety will decrease Outcome: Progressing   Problem: Safety: Goal: Ability to remain free from injury will improve Outcome: Progressing

## 2019-08-29 NOTE — Progress Notes (Signed)
Jonathon Bellows , MD [address], [place], [place], [place], [address] [address] [place], [place], [place], [address] Phone: [phone]  Fax: [phone]   Kristy [name] is being followed for Anemia  Day 1 of follow up   Subjective: No complaints of melena or abdominal pain    Objective: Vital signs in last 24 hours: Vitals:   08/28/19 1713 08/28/19 1953 08/29/19 0359 08/29/19 0730  BP: (!) 120/59 (!) 118/58 136/63 (!) 122/57  Pulse: 91 (!) 102 93 86  Resp:  20 18 20   Temp: 98.5 F (36.9 C) 99.2 F (37.3 C) 98.7 F (37.1 C) 99.1 F (37.3 C)  TempSrc: Oral Oral Oral Oral  SpO2: 99% 97% 99% 97%  Weight:   64.5 kg   Height:       Weight change: 0.454 kg  Intake/Output Summary (Last 24 hours) at 08/29/2019 1310 Last data filed at 08/29/2019 6195 Gross per 24 hour  Intake 840 ml  Output 0 ml  Net 840 ml     Lab Results: @LABTEST2 @ Micro Results: Recent Results (from the past 240 hour(s))  SARS Coronavirus 2 by RT PCR (hospital order, performed in [place] hospital lab) Nasopharyngeal Nasopharyngeal Swab     Status: None   Collection Time: 08/21/19  8:05 PM   Specimen: Nasopharyngeal Swab  Result Value Ref Range Status   SARS Coronavirus 2 NEGATIVE NEGATIVE Final    Comment: (NOTE) If result is NEGATIVE SARS-CoV-2 target nucleic acids are NOT DETECTED. The SARS-CoV-2 RNA is generally detectable in upper and lower  respiratory specimens during the acute phase of infection. The lowest  concentration of SARS-CoV-2 viral copies this assay can detect is 250  copies / mL. A negative result does not preclude SARS-CoV-2 infection  and should not be used as the sole basis for treatment or other  patient management decisions.  A negative result may occur with  improper specimen collection / handling, submission of specimen other  than nasopharyngeal swab, presence of viral mutation(s) within the  areas targeted by this assay, and inadequate number of  viral copies  (<250 copies / mL). A negative result must be combined with clinical  observations, patient history, and epidemiological information. If result is POSITIVE SARS-CoV-2 target nucleic acids are DETECTED. The SARS-CoV-2 RNA is generally detectable in upper and lower  respiratory specimens dur ing the acute phase of infection.  Positive  results are indicative of active infection with SARS-CoV-2.  Clinical  correlation with patient history and other diagnostic information is  necessary to determine patient infection status.  Positive results do  not rule out bacterial infection or co-infection with other viruses. If result is PRESUMPTIVE POSTIVE SARS-CoV-2 nucleic acids MAY BE PRESENT.   A presumptive positive result was obtained on the submitted specimen  and confirmed on repeat testing.  While 2019 novel coronavirus  (SARS-CoV-2) nucleic acids may be present in the submitted sample  additional confirmatory testing may be necessary for epidemiological  and / or clinical management purposes  to differentiate between  SARS-CoV-2 and other Sarbecovirus currently known to infect humans.  If clinically indicated additional testing with an alternate test  methodology [phone]) is advised. The SARS-CoV-2 RNA is generally  detectable in upper and lower respiratory sp ecimens during the acute  phase of infection. The expected result is Negative. Fact Sheet for Patients:  StrictlyIdeas.no Fact Sheet for Healthcare Providers: BankingDealers.co.za This test is not yet approved or cleared by the [place] FDA and has  been authorized for detection and/or diagnosis of SARS-CoV-2 by FDA under an Emergency Use Authorization (EUA).  This EUA will remain in effect (meaning this test can be used) for the duration of the COVID-19 declaration under Section 564(b)(1) of the Act, 21 U.S.C. section 360bbb-3(b)(1), unless the authorization is  terminated or revoked sooner. Performed at [facility], [address] [place], [place] [address]   Blood Culture (routine x 2)     Status: None (Preliminary result)   Collection Time: 08/21/19  8:17 PM   Specimen: BLOOD  Result Value Ref Range Status   Specimen Description BLOOD BLOOD LEFT HAND  Final   Special Requests   Final    BOTTLES DRAWN AEROBIC AND ANAEROBIC Blood Culture adequate volume   Culture   Final    NO GROWTH 4 DAYS Performed at [facility], [address] [place], [place] [address]    Report Status PENDING  Incomplete  Blood Culture (routine x 2)     Status: None (Preliminary result)   Collection Time: 08/21/19  8:18 PM   Specimen: BLOOD  Result Value Ref Range Status   Specimen Description BLOOD RIGHT ANTECUBITAL  Final   Special Requests   Final    BOTTLES DRAWN AEROBIC AND ANAEROBIC Blood Culture adequate volume   Culture   Final    NO GROWTH 4 DAYS Performed at [facility], [address], [place], [place] [address]    Report Status PENDING  Incomplete   Studies/Results: No results found. Medications: I have reviewed the patient's current medications. Scheduled Meds: . carvedilol  12.5 mg Oral BID  . Chlorhexidine Gluconate Cloth  6 each Topical Daily  . FLUoxetine  20 mg Oral Daily  . insulin aspart  0-5 Units Subcutaneous QHS  . insulin regular human CONCENTRATED  20-100 Units Subcutaneous TID WC  . nystatin ointment   Topical BID  . pantoprazole  40 mg Oral BID  . spironolactone  25 mg Oral Daily   Continuous Infusions: PRN Meds:.dextrose, hydrALAZINE   Assessment: Principal Problem:   Hyperosmolar hyperglycemic state (HHS) ([place]) Active Problems:   Uncontrolled type 2 diabetes mellitus with stage 3 chronic kidney disease, with long-term current use of insulin (HCC)   Essential hypertension   LBBB (left bundle branch block)   Coronary artery disease, non-occlusive   Chronic systolic CHF (congestive  heart failure) (HCC)   NICM (nonischemic cardiomyopathy) (HCC)   Elevated troponin   Hypernatremia   Sepsis ([place])   Acute renal failure superimposed on stage 3a chronic kidney disease ([place])   Acute metabolic encephalopathy  Kristy [name] is a 68 y.o. y/o female poorly controlled diabetes admitted with hypoglycemia altered mental status.  Some history of coffee-ground emesis.  I have been called for drop in hemoglobin.  Hemoglobin was 11.7 g 3 days back.  Then went up to 13 g with associated rise in creatinine and again has dropped back to the baseline seen 3 days back.  Plavix has been held since admission on 08/22/2019.  No one has seen any hematemesis or melena in the hospital.Hb dropped today likely due to hemodilution from fluids received last few days. Overnight no melena or blood loss. Hb has been stable .   I will sign off.  Please call me if any further GI concerns or questions.  We would like to thank you for the opportunity to participate in the care of Kristy [name].     LOS: 7 days   Richar Dunklee  Vicente Males, MD 08/29/2019, 1:10 PM

## 2019-08-29 NOTE — TOC Progression Note (Signed)
Transition of Care [facility]) - Progression Note    Patient Details  Name: Kristy [name] MRN: [id number] Date of Birth: [birth year]  Transition of [facility]) CM/SW Contact  [name], [place] Phone Number: 08/29/2019, 6:12 PM  Clinical Narrative:    CSW continuing to work on placement for ALF for patient.  Referral sent to [place], [place].  Patient had PPD ordered to be ready before admission to ALF.  Patient's stepdaughter pursuing guardianship per APS, however, patient does not know this yet.  Patient has a Guardian at [facility], Milus Glazier from APS is case worker for patient.  CSW awaiting for response from different ALFs.   Expected Discharge Plan: Skilled Nursing Facility Barriers to Discharge: Ship broker, Continued Medical Work up  Expected Discharge Plan and Services Expected Discharge Plan: [place] Choice: [place] arrangements for the past 2 months: Single Family Home                                       Social Determinants of Health (SDOH) Interventions    Readmission Risk Interventions Readmission Risk Prevention Plan 04/28/2019 04/27/2019  Post Dischage Appt - Complete  Medication Screening - Complete  Transportation Screening - Complete  PCP or Specialist Appt within 5-7 Days Complete -  Home Care Screening Complete -  Medication Review (RN CM) Complete -  Some recent data might be hidden

## 2019-08-29 NOTE — Progress Notes (Signed)
Physical Therapy Treatment Patient Details Name: Kristy [name] MRN: [id number] DOB: Apr 06, 1951 Today's Date: 08/29/2019    History of Present Illness 68 y.o. female was admitted again after a two week return home, noted to have AMS, acute encephalopathy, sepsis and coffee ground emesis.  Pt reports falls but has a great deal of issues with memory both acutely and LT, for example did not know her age.  PMHx:  CAD, nonischemic cardiomyopathy, DKA, DM, hematemesis, encephalopathy, hypernatremia, UTI with sepsis, CHF, AMS, EF 35-40%    PT Comments    Pt resting in bed upon PT arrival and appearing very agreeable to PT and walking.  Able to walk around nursing station (no UE support) with CGA to SBA; no loss of balance noted with session's activities.  Will continue to focus on strengthening, functional mobility, and higher level balance during hospital stay.    Follow Up Recommendations  Home health PT;Supervision for mobility/OOB     Equipment Recommendations  None recommended by PT    Recommendations for Other Services       Precautions / Restrictions Precautions Precautions: Fall Restrictions Weight Bearing Restrictions: No    Mobility  Bed Mobility Overal bed mobility: Modified Independent Bed Mobility: Supine to Sit;Sit to Supine     Supine to sit: Modified independent (Device/Increase time);HOB elevated Sit to supine: Modified independent (Device/Increase time);HOB elevated   General bed mobility comments: No difficulties noted with bed mobility.  Transfers Overall transfer level: Needs assistance Equipment used: None Transfers: Sit to/from Stand Sit to Stand: Supervision         General transfer comment: strong transfers noted; steady  Ambulation/Gait Ambulation/Gait assistance: Supervision;Min guard Gait Distance (Feet): 200 Feet Assistive device: None Gait Pattern/deviations: Step-through pattern Gait velocity: mildly decreased   General Gait  Details: mild increased B lateral sway but overall steady; no loss of balance noted during session's activities   Stairs             Wheelchair Mobility    Modified Rankin (Stroke Patients Only)       Balance Overall balance assessment: Needs assistance Sitting-balance support: No upper extremity supported;Feet supported Sitting balance-Leahy Scale: Normal Sitting balance - Comments: steady sitting reaching outside BOS   Standing balance support: No upper extremity supported;During functional activity Standing balance-Leahy Scale: Good Standing balance comment: mild increased B lateral sway during ambulation but overall steady                            Cognition Arousal/Alertness: Awake/alert Behavior During Therapy: WFL for tasks assessed/performed Overall Cognitive Status: No family/caregiver present to determine baseline cognitive functioning                                 General Comments: Oriented to person, place, situation, and time.      Exercises      General Comments   Nursing cleared pt for participation in physical therapy.  Pt agreeable to PT session.      Pertinent Vitals/Pain Pain Assessment: No/denies pain  Vitals (HR and O2 sats on room air) stable and WFL throughout treatment session.    Home Living                      Prior Function            PT Goals (current goals can now be found  in the care plan section) Acute Rehab PT Goals Patient Stated Goal: go home PT Goal Formulation: With patient Time For Goal Achievement: 09/06/19 Potential to Achieve Goals: Good Progress towards PT goals: Progressing toward goals    Frequency    Min 2X/week      PT Plan Current plan remains appropriate    Co-evaluation              AM-PAC PT "6 Clicks" Mobility   Outcome Measure  Help needed turning from your back to your side while in a flat bed without using bedrails?: None Help needed moving  from lying on your back to sitting on the side of a flat bed without using bedrails?: None Help needed moving to and from a bed to a chair (including a wheelchair)?: A Little Help needed standing up from a chair using your arms (e.g., wheelchair or bedside chair)?: A Little Help needed to walk in hospital room?: A Little Help needed climbing 3-5 steps with a railing? : A Little 6 Click Score: 20    End of Session Equipment Utilized During Treatment: Gait belt Activity Tolerance: Patient tolerated treatment well Patient left: in bed;with call bell/phone within reach;with bed alarm set Nurse Communication: Mobility status;Precautions PT Visit Diagnosis: Unsteadiness on feet (R26.81);History of falling (Z91.81);Difficulty in walking, not elsewhere classified (R26.2);Muscle weakness (generalized) (M62.81)     Time: 1445-1500 PT Time Calculation (min) (ACUTE ONLY): 15 min  Charges:  $Therapeutic Exercise: 8-22 mins                     Leitha Bleak, PT 08/29/19, 3:31 PM [phone]

## 2019-08-29 NOTE — Care Management Important Message (Signed)
Important Message  Patient Details  Name: Kristy [name] MRN: [id number] Date of Birth: 03/27/1951   Medicare Important Message Given:  Yes     [name] 08/29/2019, 2:03 PM

## 2019-08-30 LAB — GLUCOSE, CAPILLARY
Glucose-Capillary: 344 mg/dL — ABNORMAL HIGH (ref 70–99)
Glucose-Capillary: 328 mg/dL — ABNORMAL HIGH (ref 70–99)
Glucose-Capillary: 168 mg/dL — ABNORMAL HIGH (ref 70–99)
Glucose-Capillary: 83 mg/dL (ref 70–99)

## 2019-08-30 NOTE — Progress Notes (Signed)
PROGRESS NOTE  KAETLYN NOA TJQ:[id number] DOB: 1951/03/19 DOA: 08/21/2019 PCP: Owens Loffler, MD  Brief History   68 year old woman PMH includes systolic CHF, Crohn's disease, chronic kidney disease, diabetes mellitus type 2 presented with altered mental status, hyperglycemia.  Glucose 1434 in ED.  Recently discharged 11/11 with DKA.  Reportedly noncompliant with medications.  Was admitted for acute encephalopathy secondary to hyperglycemic hyperosmolar state, concern for sepsis, acute kidney injury, possible coffee-ground emesis, chronic systolic CHF.  Was quickly transitioned off IV insulin.  Sepsis was ruled out and antibiotics were discontinued.  Noted to have confusion, impulsiveness.  Seen by psychiatry, patient felt to not have capacity.  A & P  Diabetes mellitus type 2, uncontrolled, with CKD stage III, hemoglobin A1c 10.9 --Remains labile but less than 400.  Continue to monitor.  May need to adjust 11/29.  Anemia with drop in hemoglobin.  Previous history of coffee-ground emesis, not this admission.  No reported hematemesis or melena this admission.  Hemoglobin dropp thought to be secondary to hemodilution. --GGiven no melena or blood loss, as well as stable hemoglobin, GI recommended no further evaluation suggested. --Hemoglobin stable yesterday. --Continue aspirin and Plavix  Thrombocytopenia, intermittent.  Seen earlier in November as well as in July 2020. --Appears stable.  No further inpatient evaluation suggested.  Follow-up as an outpatient.  Acute metabolic encephalopathy, cognitive decline, depression, lack of capacity.  Per daughter patient often skips medication.  Family requested psychiatry evaluation. --Seen by psychiatry with recommendation to continue Prozac, use Haldol or Ativan as needed.  Per psychiatry, patient not able to understand the details of her insulin regimen nor administered on a regular basis.,  Will need help on a daily basis to assist with  medication delivery and adherence.  Worsening cognition noted. --Seen in follow-up by psychiatry 11/26, impression was neurocognitive disorder and unspecified anxiety disorder.  Patient does not appear to understand severity of medical condition and is still lacking in capacity to make medical decisions and unable to take care of herself.  Chronic systolic CHF, LVEF 76-22%. Ivabradine held for AKI. --Continue carvedilol, Aldactone.  Resume Entresto when blood pressure can tolerate.   PPD placed today 11/27  Resolved Hospital Problem list    Hyperglycemic hyperosmolar state associated acute metabolic encephalopathy.  Thought secondary to noncompliance.  Per documentation, daughter reported patient often skips medication.  Sepsis considered on admission, subsequently ruled out.  Covid negative. Urinalysis negative.  Chest x-ray mild bronchitis.  Procalcitonin negative.  Lactic acid resolved with hydration.  Blood cultures no growth to date.  Insignificant elevation of hyper sensitivity troponin.  No further evaluation suggested.  Acute kidney injury superimposed on CKD stage III   DVT prophylaxis: SCds Code Status: Full Family Communication: none Disposition Plan: SNF   Murray Hodgkins, MD  [facility] Direct contact: see [email or web address] (further directions at bottom of note if needed) 7PM-7AM contact night coverage as at bottom of note 08/30/2019, 4:32 PM  LOS: 8 days   Significant Hospital Events   . 11/19 admitted for hyperosmolar hyperglycemic state with acute metabolic encephalopathy and acute kidney injury . 11/20 off insulin infusion . 11/24, 11/26 psychiatry consultation.  Patient felt to not have capacity to care for herself medically.  SNF pursued. . 11/26 hemoglobin dropped from 15-11.1.  GI consulted.   Consults:  . Psychiatry . Gastroenterology   Procedures:  .   Significant Diagnostic Tests:  Marland Kitchen    Micro Data:  . Blood cultures no growth to date    Antimicrobials:  .  Interval History/Subjective  No new issues voiced today.  Objective   Vitals:  Vitals:   08/30/19 0802 08/30/19 1537  BP: 122/66 127/69  Pulse: 87 86  Resp: 18 18  Temp: 99.2 F (37.3 C) 98.6 F (37 C)  SpO2: 96% 98%    Exam:  Constitutional.  Appears calm, comfortable. Cardiovascular.  Regular rate and rhythm.  No murmur, rub or gallop.  No lower extremity edema.   Respiratory.  Clear to auscultation bilaterally.  No wheezes, rales or rhonchi.  Normal respiratory effort. Psychiatric.  Grossly normal mood and affect.  Speech fluent and appropriate.  I have personally reviewed the following:   Today's Data  CBG somewhat labile but overall stable. Scheduled Meds: . aspirin EC  81 mg Oral Daily  . carvedilol  12.5 mg Oral BID  . Chlorhexidine Gluconate Cloth  6 each Topical Daily  . clopidogrel  75 mg Oral Q breakfast  . FLUoxetine  20 mg Oral Daily  . insulin aspart  0-5 Units Subcutaneous QHS  . insulin regular human CONCENTRATED  20-100 Units Subcutaneous TID WC  . nystatin ointment   Topical BID  . pantoprazole  40 mg Oral BID  . spironolactone  25 mg Oral Daily  . tuberculin  5 Units Intradermal Once   Continuous Infusions:  Principal Problem:   Uncontrolled type 2 diabetes mellitus with stage 3 chronic kidney disease, with long-term current use of insulin (HCC) Active Problems:   Essential hypertension   LBBB (left bundle branch block)   Coronary artery disease, non-occlusive   Chronic systolic CHF (congestive heart failure) (HCC)   NICM (nonischemic cardiomyopathy) (HCC)   Elevated troponin   Sepsis ([place])   Acute metabolic encephalopathy   Thrombocytopenia (HCC)   LOS: 8 days   How to contact the [facility] Attending or Consulting provider 7A - 7P or covering provider during after hours [place], for this patient?  1. Check the care team in [facility] and look for a) attending/consulting TRH provider listed and b) the [facility] team listed 2. Log  into [email or web address] and use [place]'s universal password to access. If you do not have the password, please contact the hospital operator. 3. Locate the [facility] provider you are looking for under [facility] and page to a number that you can be directly reached. 4. If you still have difficulty reaching the provider, please page the [facility] (Director on Call) for the Hospitalists listed on amion for assistance.

## 2019-08-31 ENCOUNTER — Other Ambulatory Visit: Payer: Self-pay

## 2019-08-31 ENCOUNTER — Encounter: Payer: Self-pay | Admitting: *Deleted

## 2019-08-31 LAB — GLUCOSE, CAPILLARY
Glucose-Capillary: 290 mg/dL — ABNORMAL HIGH (ref 70–99)
Glucose-Capillary: 425 mg/dL — ABNORMAL HIGH (ref 70–99)
Glucose-Capillary: 108 mg/dL — ABNORMAL HIGH (ref 70–99)
Glucose-Capillary: 84 mg/dL (ref 70–99)

## 2019-08-31 MED ORDER — SACUBITRIL-VALSARTAN 49-51 MG PO TABS
1.0000 | ORAL_TABLET | Freq: Two times a day (BID) | ORAL | Status: DC
  Administered 2019-08-31 – 2019-09-23 (×46): 1 via ORAL
  Filled 2019-08-31 (×50): qty 1

## 2019-08-31 NOTE — Plan of Care (Signed)
  Problem: Education: Goal: Knowledge of General Education information will improve Description: Including pain rating scale, medication(s)/side effects and non-pharmacologic comfort measures Outcome: Progressing   Problem: Clinical Measurements: Goal: Will remain free from infection Outcome: Progressing Goal: Diagnostic test results will improve Outcome: Progressing

## 2019-08-31 NOTE — Progress Notes (Signed)
PROGRESS NOTE  Kristy [name] [id number]:[id number] DOB: [birth year] DOA: 08/21/2019 PCP: Owens Loffler, MD  Brief History   68 year old woman PMH includes systolic CHF, Crohn's disease, chronic kidney disease, diabetes mellitus type 2 presented with altered mental status, hyperglycemia.  Glucose 1434 in ED.  Recently discharged 11/11 with DKA.  Reportedly noncompliant with medications.  Was admitted for acute encephalopathy secondary to hyperglycemic hyperosmolar state, concern for sepsis, acute kidney injury, possible coffee-ground emesis, chronic systolic CHF.  Was quickly transitioned off IV insulin.  Sepsis was ruled out and antibiotics were discontinued.  Noted to have confusion, impulsiveness.  Seen by psychiatry, patient felt to not have capacity.  A & P  Diabetes mellitus type 2, uncontrolled, with CKD stage III, hemoglobin A1c 10.9 --Somewhat labile, was 83 last night, high today.  Continue to monitor.  No changes to current regimen.  Continue incentive  Anemia with drop in hemoglobin.  Previous history of coffee-ground emesis, not this admission.  No reported hematemesis or melena this admission.  Hemoglobin dropp thought to be secondary to hemodilution. --Given no melena or blood loss, as well as stable hemoglobin, GI recommended no further evaluation suggested. --Hemoglobin has been stable. --Continue aspirin and Plavix  Thrombocytopenia, intermittent.  Seen earlier in November as well as in July 2020. --Stable.  No further inpatient evaluation suggested.  Follow-up as an outpatient.  Acute metabolic encephalopathy, cognitive decline, depression, lack of capacity.  Per daughter patient often skips medication.  Family requested psychiatry evaluation. --Seen by psychiatry with recommendation to continue Prozac, use Haldol or Ativan as needed.  Per psychiatry, patient not able to understand the details of her insulin regimen nor administered on a regular basis.,  Will need help on a daily  basis to assist with medication delivery and adherence.  Worsening cognition noted. --Seen in follow-up by psychiatry 11/26, impression was neurocognitive disorder and unspecified anxiety disorder.  Patient does not appear to understand severity of medical condition and is still lacking in capacity to make medical decisions and unable to take care of herself.  Chronic systolic CHF, LVEF 57-84%. Ivabradine held for AKI. --Continue carvedilol, Aldactone.  Resume Entresto.  PPD placed 11/27.  Read 11/29.  No reaction noted.  Resolved Hospital Problem list    Hyperglycemic hyperosmolar state associated acute metabolic encephalopathy.  Thought secondary to noncompliance.  Per documentation, daughter reported patient often skips medication.  Sepsis considered on admission, subsequently ruled out.  Covid negative. Urinalysis negative.  Chest x-ray mild bronchitis.  Procalcitonin negative.  Lactic acid resolved with hydration.  Blood cultures no growth to date.  Insignificant elevation of hyper sensitivity troponin.  No further evaluation suggested.  Acute kidney injury superimposed on CKD stage III   DVT prophylaxis: SCds Code Status: Full Family Communication: none Disposition Plan: SNF, remains stable.   Murray Hodgkins, MD  [facility] Direct contact: see [email or web address] (further directions at bottom of note if needed) 7PM-7AM contact night coverage as at bottom of note 08/31/2019, 1:01 PM  LOS: 9 days   Significant Hospital Events   . 11/19 admitted for hyperosmolar hyperglycemic state with acute metabolic encephalopathy and acute kidney injury . 11/20 off insulin infusion . 11/24, 11/26 psychiatry consultation.  Patient felt to not have capacity to care for herself medically.  SNF pursued. . 11/26 hemoglobin dropped from 15-11.1.  GI consulted.   Consults:  . Psychiatry . Gastroenterology   Procedures:  .   Significant Diagnostic Tests:  Marland Kitchen    Micro Data:  . Blood  cultures  no growth to date   Antimicrobials:  .   Interval History/Subjective  No complaints today.  Objective   Vitals:  Vitals:   08/31/19 0446 08/31/19 0755  BP: 131/60 (!) 144/75  Pulse: 89 90  Resp: 16 19  Temp: 99.7 F (37.6 C) 98.7 F (37.1 C)  SpO2: 94% 99%    Exam:  Constitutional.  Appears unremarkable. Respiratory.  Clear to auscultation bilaterally.  No wheezes, rales or rhonchi.  Normal respiratory effort. Cardiovascular.  Regular rate and rhythm.  No murmur, rub or gallop. Skin.  Site of PPD injection left arm visualized, no significant reaction noted. Psychiatric.  Grossly normal mood and affect.  Speech fluent and appropriate.  I have personally reviewed the following:   Today's Data  CBG quite labile but overall stable.  Scheduled Meds: . aspirin EC  81 mg Oral Daily  . carvedilol  12.5 mg Oral BID  . Chlorhexidine Gluconate Cloth  6 each Topical Daily  . clopidogrel  75 mg Oral Q breakfast  . FLUoxetine  20 mg Oral Daily  . insulin aspart  0-5 Units Subcutaneous QHS  . insulin regular human CONCENTRATED  20-100 Units Subcutaneous TID WC  . nystatin ointment   Topical BID  . pantoprazole  40 mg Oral BID  . spironolactone  25 mg Oral Daily  . tuberculin  5 Units Intradermal Once   Continuous Infusions:  Principal Problem:   Uncontrolled type 2 diabetes mellitus with stage 3 chronic kidney disease, with long-term current use of insulin (HCC) Active Problems:   Essential hypertension   LBBB (left bundle branch block)   Coronary artery disease, non-occlusive   Chronic systolic CHF (congestive heart failure) (HCC)   NICM (nonischemic cardiomyopathy) (HCC)   Elevated troponin   Sepsis ([place])   Acute metabolic encephalopathy   Thrombocytopenia (HCC)   LOS: 9 days   How to contact the [facility] Attending or Consulting provider 7A - 7P or covering provider during after hours [place], for this patient?  1. Check the care team in [facility] and look for a)  attending/consulting TRH provider listed and b) the [facility] team listed 2. Log into [email or web address] and use [place]'s universal password to access. If you do not have the password, please contact the hospital operator. 3. Locate the [facility] provider you are looking for under [facility] and page to a number that you can be directly reached. 4. If you still have difficulty reaching the provider, please page the [facility] (Director on Call) for the Hospitalists listed on amion for assistance.

## 2019-08-31 NOTE — Plan of Care (Signed)
  Problem: Clinical Measurements: Goal: Diagnostic test results will improve Outcome: Progressing   Problem: Activity: Goal: Risk for activity intolerance will decrease Outcome: Progressing   Problem: Safety: Goal: Ability to remain free from injury will improve Outcome: Progressing

## 2019-09-01 ENCOUNTER — Encounter: Payer: Self-pay | Admitting: Family Medicine

## 2019-09-01 LAB — GLUCOSE, CAPILLARY
Glucose-Capillary: 293 mg/dL — ABNORMAL HIGH (ref 70–99)
Glucose-Capillary: 526 mg/dL (ref 70–99)
Glucose-Capillary: 337 mg/dL — ABNORMAL HIGH (ref 70–99)
Glucose-Capillary: 181 mg/dL — ABNORMAL HIGH (ref 70–99)

## 2019-09-01 LAB — SARS CORONAVIRUS 2 (TAT 6-24 HRS): SARS Coronavirus 2: NEGATIVE

## 2019-09-01 MED ORDER — INSULIN GLARGINE 100 UNIT/ML ~~LOC~~ SOLN
12.0000 [IU] | Freq: Every day | SUBCUTANEOUS | Status: DC
  Administered 2019-09-01: 12 [IU] via SUBCUTANEOUS
  Filled 2019-09-01 (×2): qty 0.12

## 2019-09-01 NOTE — Progress Notes (Addendum)
Inpatient Diabetes Program Recommendations  AACE/ADA: New Consensus Statement on Inpatient Glycemic Control (2015)  Target Ranges:  Prepandial:   less than 140 mg/dL      Peak postprandial:   less than 180 mg/dL (1-2 hours)      Critically ill patients:  140 - 180 mg/dL   Results for Kristy, [name] (MRN [id number]) as of 09/01/2019 09:44  Ref. Range 08/30/2019 08:00 08/30/2019 12:01 08/30/2019 17:16 08/30/2019 21:25  Glucose-Capillary Latest Ref Range: 70 - 99 mg/dL 344 (H)  50 units U500 Insulin 328 (H)  50 units U500 Insulin 168 (H) 83   Results for Kristy, [name] (MRN [id number]) as of 09/01/2019 09:44  Ref. Range 08/31/2019 07:57 08/31/2019 11:21 08/31/2019 16:54 08/31/2019 21:12  Glucose-Capillary Latest Ref Range: 70 - 99 mg/dL 290 (H)  20 units U500 Insulin 425 (H)  90 units U500 Insulin 108 (H) 84   Results for Kristy, [name] (MRN [id number]) as of 09/01/2019 12:25  Ref. Range 09/01/2019 07:32 09/01/2019 12:08  Glucose-Capillary Latest Ref Range: 70 - 99 mg/dL 293 (H)  NO Insulin given 526 (HH)  100 units U500 Insulin     Home DM Meds: Concentrated U500 Insulin QID (before meals and at bedtime)               <200- 0 units          201-300- 20 units          301-400- 50 units          401-500- 90 units               >500- 100 units  Current Orders: Concentrated U500 Insulin 20-100 units TID with meals according to the following scale: 201-300- 20 units 301-400- 50 units 401-500- 90 units >500- 100 units      MD- Note patient having elevated AM CBGs consistently.  Also seems to be elevated at 12pm as well.  Of note, AM dose of U500 Insulin HELD this AM--Unsure why??  As a result, CBG 526 at 12pm today.  May consider the following:  1. To help with elevated AM CBGs, may consider adding bedtime dose of Lantus: Lantus 12 units QHS (~0.2 units/kg dosing)   2. To help with 12pm elevated CBGs, may consider adjusting the U500 sliding scale to the  following: <200- 0 units 201-300- 30 units 301-400- 60 units 401-500- 100 units >500- 110 units     --Will follow patient during hospitalization--  Wyn Quaker RN, MSN, CDE Diabetes Coordinator Inpatient Glycemic Control Team Team Pager: [phone] (8a-5p)

## 2019-09-01 NOTE — Telephone Encounter (Signed)
I placed on your keyboard.

## 2019-09-01 NOTE — Progress Notes (Signed)
letter

## 2019-09-01 NOTE — Telephone Encounter (Signed)
Kristy [name] (do not see DPR signed) left v/m requesting cb with status of letter of guardianship.pt presently in hospital since 08/27/19. Pt last seen for HFU on 07/21/19. Noted under demographics that pts niece Lucius Conn is HCPOA.

## 2019-09-01 NOTE — Care Management Important Message (Signed)
Important Message  Patient Details  Name: Kristy [name] MRN: [id number] Date of Birth: 27-Apr-1951   Medicare Important Message Given:  Yes     [name] 09/01/2019, 2:02 PM

## 2019-09-01 NOTE — Progress Notes (Signed)
Sent Secure Chat by Attending MD regarding issues with discharging pt to ALF with sliding scale regimen of U500 Insulin.  From Chart review, I saw that Ms. Kunz was denied SNF placement last admission and was discharged home with [facility] on 11/13. Has been taking U500 Insulin for a long time and has told me duting a past admission that she has tried other insulins in the past but U500 Insulin seems to work the best for her and does NOT want to switch to any other insulin. Started seeing a Administrator, arts at [facility] in March, but since that 1 appointment she attended, she has not returned to see the Endo's at the [facility]. Had an appointment with Dr. Reggie Pile, from [facility] scheduled on 08/20/2019 but did NOT go to the appointment. Has been approved to get a Continous Glucose monitor from [facility], but has yet to show up for an appointment to get set up with this glucose measuring device. Not sure why [facility] would not allow pt to give her own insulin?? She has been giving her own injections for years and is very familiar with her home U500 Insulin regimen. Not sure that it would be safe to discharge her on Lantus and Novolog b/c I have no idea how much insulin to start with since U500 Insulin is so different and much more concentrated than Lantus and Novolog insulins. Would hate to guess at a conversion amount as she could wind back up in the ED with high CBGs again. The [facility] practice might could help with conversion, however, I doubt they would be willing to assist as pt has not seen anyone in their practice since March of this year. Could pt be allowed to give her own U500 Insulin at the ALF??   Per follow up with Care Management (TOC team), it appears the ALF is not willing to allow pt to administer her own insulin U500 insulin and also they are unwilling to use a sliding scale to administer the U500 Insulin to the patient.  As stated above, I am uncomfortable recommending a  conversion to Lantus and Novolog since these insulins are so vastly different compared to the U500 Insulin.  Not sure that sending pt out on a fixed dose of U500 insulin is a good option either as she did not get any U500 insulin with [facility] the last 2 days and her CBGs at bedtime were 83 and 84 mg/dl respectively.     --Will follow patient during hospitalization--  Wyn Quaker RN, MSN, CDE Diabetes Coordinator Inpatient Glycemic Control Team Team Pager: [phone] (8a-5p)

## 2019-09-01 NOTE — Progress Notes (Signed)
PROGRESS NOTE  Kristy [name] NOM:[id number] DOB: 24-Nov-1950 DOA: 08/21/2019 PCP: Owens Loffler, MD  Brief History   68 year old woman PMH includes systolic CHF, Crohn's disease, chronic kidney disease, diabetes mellitus type 2 presented with altered mental status, hyperglycemia.  Glucose 1434 in ED.  Recently discharged 11/11 with DKA.  Reportedly noncompliant with medications.  Was admitted for acute encephalopathy secondary to hyperglycemic hyperosmolar state, concern for sepsis, acute kidney injury, possible coffee-ground emesis, chronic systolic CHF.  Was quickly transitioned off IV insulin.  Sepsis was ruled out and antibiotics were discontinued.  Noted to have confusion, impulsiveness.  Seen by psychiatry, patient felt to not have capacity.  A & P  Diabetes mellitus type 2, uncontrolled, with CKD stage III, hemoglobin A1c 10.9 --Remains quite labile.  Have followed recommendations from diabetes RN, added Lantus to address elevated a.m. sugars and adjusted sliding scale U5 100 to address elevated afternoon blood sugars.  Anemia with drop in hemoglobin.  Previous history of coffee-ground emesis, not this admission.  No reported hematemesis or melena this admission.  Hemoglobin dropp thought to be secondary to hemodilution. --Remained stable.  Given no melena or blood loss, as well as stable hemoglobin, GI recommended no further evaluation suggested. --Continue aspirin and Plavix  Thrombocytopenia, intermittent.  Seen earlier in November as well as in July 2020. --No further inpatient evaluation suggested.  Follow-up as an outpatient.  Acute metabolic encephalopathy, cognitive decline, depression, lack of capacity.  Per daughter patient often skips medication.  Family requested psychiatry evaluation. --Seen by psychiatry with recommendation to continue Prozac, use Haldol or Ativan as needed.  Per psychiatry, patient not able to understand the details of her insulin regimen nor  administered on a regular basis.,  Will need help on a daily basis to assist with medication delivery and adherence.  Worsening cognition noted. --Seen in follow-up by psychiatry 11/26, impression was neurocognitive disorder and unspecified anxiety disorder.  Patient does not appear to understand severity of medical condition and is still lacking in capacity to make medical decisions and unable to take care of herself.  Chronic systolic CHF, LVEF 96-28%. Ivabradine held for AKI. --Continue carvedilol, Aldactone.  Continue Entresto.  PPD placed 11/27.  Read 11/29.  No reaction noted.  See progress note from diabetic coordinator 11/30.  Unfortunately ALF is refusing to take the patient because she is on sliding scale U5 100.  She has been on this for many years and converting this is felt to be quite difficult.  At this point her disposition is unclear until facility can be located that can assist the patient with the administration of her chronic longstanding insulin.  Resolved Hospital Problem list    Hyperglycemic hyperosmolar state associated acute metabolic encephalopathy.  Thought secondary to noncompliance.  Per documentation, daughter reported patient often skips medication.  Sepsis considered on admission, subsequently ruled out.  Covid negative. Urinalysis negative.  Chest x-ray mild bronchitis.  Procalcitonin negative.  Lactic acid resolved with hydration.  Blood cultures no growth to date.  Insignificant elevation of hyper sensitivity troponin.  No further evaluation suggested.  Acute kidney injury superimposed on CKD stage III   DVT prophylaxis: SCds Code Status: Full Family Communication: none Disposition Plan: SNF, remains stable.   Murray Hodgkins, MD  [facility] Direct contact: see [email or web address] (further directions at bottom of note if needed) 7PM-7AM contact night coverage as at bottom of note 09/01/2019, 4:55 PM  LOS: 10 days   Significant Hospital Events   .  11/19 admitted for  hyperosmolar hyperglycemic state with acute metabolic encephalopathy and acute kidney injury . 11/20 off insulin infusion . 11/24, 11/26 psychiatry consultation.  Patient felt to not have capacity to care for herself medically.  SNF pursued. . 11/26 hemoglobin dropped from 15-11.1.  GI consulted.   Consults:  . Psychiatry . Gastroenterology   Procedures:  .   Significant Diagnostic Tests:  Marland Kitchen    Micro Data:  . Blood cultures no growth to date   Antimicrobials:  .   Interval History/Subjective  No complaints today.  Objective   Vitals:  Vitals:   09/01/19 0440 09/01/19 1209  BP: 131/71 (!) 113/99  Pulse: 90 95  Resp: 16 18  Temp: 98.1 F (36.7 C) 99.4 F (37.4 C)  SpO2: 95% 97%    Exam:  Constitutional.  Appears unremarkable. Respiratory.  Clear to auscultation bilaterally.  No wheezes, rales or rhonchi.  Normal respiratory effort. Cardiovascular.  Regular rate and rhythm.  No murmur, rub or gallop. Skin.  Site of PPD injection left arm visualized, no significant reaction noted. Psychiatric.  Grossly normal mood and affect.  Speech fluent and appropriate.  I have personally reviewed the following:   Today's Data  CBG quite labile but overall stable.  Scheduled Meds: . aspirin EC  81 mg Oral Daily  . carvedilol  12.5 mg Oral BID  . Chlorhexidine Gluconate Cloth  6 each Topical Daily  . clopidogrel  75 mg Oral Q breakfast  . FLUoxetine  20 mg Oral Daily  . insulin aspart  0-5 Units Subcutaneous QHS  . insulin glargine  12 Units Subcutaneous QHS  . insulin regular human CONCENTRATED  20-100 Units Subcutaneous TID WC  . nystatin ointment   Topical BID  . pantoprazole  40 mg Oral BID  . sacubitril-valsartan  1 tablet Oral BID  . spironolactone  25 mg Oral Daily   Continuous Infusions:  Principal Problem:   Uncontrolled type 2 diabetes mellitus with stage 3 chronic kidney disease, with long-term current use of insulin (HCC) Active  Problems:   Essential hypertension   LBBB (left bundle branch block)   Coronary artery disease, non-occlusive   Chronic systolic CHF (congestive heart failure) (HCC)   NICM (nonischemic cardiomyopathy) (HCC)   Elevated troponin   Sepsis ([place])   Acute metabolic encephalopathy   Thrombocytopenia (HCC)   LOS: 10 days   How to contact the [facility] Attending or Consulting provider 7A - 7P or covering provider during after hours [place], for this patient?  1. Check the care team in [facility] and look for a) attending/consulting TRH provider listed and b) the [facility] team listed 2. Log into [email or web address] and use [place]'s universal password to access. If you do not have the password, please contact the hospital operator. 3. Locate the [facility] provider you are looking for under [facility] and page to a number that you can be directly reached. 4. If you still have difficulty reaching the provider, please page the [facility] (Director on Call) for the Hospitalists listed on amion for assistance.

## 2019-09-01 NOTE — Progress Notes (Signed)
Ch f/u with pt who is a 68 y.o. female that has a hx of CHF, Crohn's disease, chronic kidney disease, diabetes, and high cholesterol. Pt presented to have a positive affect and tolerated her lunch well. Pt expressed her concerns regarding her TOC/GOC/EOL matters. Pt shared that she is aware that her blood sugar levels are frequently high due to her other health challenges. Pt shares that she does not take her medicine for her cholesterol but tries to monitor her insulin levels as best she can at home. Pt is a widow that has the support of her stepdaughter and other family. Pt is shares that she would be willing to go to a ALF but would rather be at home. Ch allowed space for the pt to share about what she would prefer her EOL matters to be which at this point would change her from FULL to DNR status. Pt shared that she is aware that she has a terminal illness and does not want any aggressive treatment. Ch shared with the pt that her loved ones's may have a hard time accepting this and the pt agreed. Ch was present while pt's vitals were taken. Pt had a blood sugar level of over 500 and a borderline elevated BP. Pt was given insulin by nurse at bedside. Ch provided words of comfort, spiritual/grief support for pt that is becoming restless while awaiting to be d/c.  F/u visit was appreciated.    09/01/19 1200  Clinical Encounter Type  Visited With Patient;Health care provider  Visit Type Follow-up;Psychological support;Spiritual support;Social support  Spiritual Encounters  Spiritual Needs Emotional;Grief support  Stress Factors  Patient Stress Factors Exhausted;Family relationships;Financial concerns;Health changes;Loss of control;Major life changes  Family Stress Factors None identified

## 2019-09-02 LAB — GLUCOSE, CAPILLARY
Glucose-Capillary: 111 mg/dL — ABNORMAL HIGH (ref 70–99)
Glucose-Capillary: 118 mg/dL — ABNORMAL HIGH (ref 70–99)
Glucose-Capillary: 148 mg/dL — ABNORMAL HIGH (ref 70–99)
Glucose-Capillary: 312 mg/dL — ABNORMAL HIGH (ref 70–99)
Glucose-Capillary: 365 mg/dL — ABNORMAL HIGH (ref 70–99)
Glucose-Capillary: 40 mg/dL — CL (ref 70–99)

## 2019-09-02 MED ORDER — DEXTROSE 50 % IV SOLN: 25.0000 g | INTRAVENOUS | Status: AC

## 2019-09-02 MED ORDER — INSULIN GLARGINE 100 UNIT/ML ~~LOC~~ SOLN
15.0000 [IU] | Freq: Every day | SUBCUTANEOUS | Status: DC
  Filled 2019-09-02: qty 0.15

## 2019-09-02 MED ORDER — INSULIN GLARGINE 100 UNIT/ML ~~LOC~~ SOLN
12.0000 [IU] | Freq: Every day | SUBCUTANEOUS | Status: DC
  Filled 2019-09-02: qty 0.12

## 2019-09-02 NOTE — Telephone Encounter (Signed)
Left message for Jocelyn Lamer that letter is ready to be picked up at our front office.

## 2019-09-02 NOTE — Progress Notes (Signed)
Inpatient Diabetes Program Recommendations  AACE/ADA: New Consensus Statement on Inpatient Glycemic Control (2015)  Target Ranges:  Prepandial:   less than 140 mg/dL      Peak postprandial:   less than 180 mg/dL (1-2 hours)      Critically ill patients:  140 - 180 mg/dL   Results for Kristy [name], Kristy [name] (MRN [id number]) as of 09/02/2019 07:33  Ref. Range 09/01/2019 07:32 09/01/2019 12:08 09/01/2019 16:30 09/01/2019 21:20 09/02/2019 00:06 09/02/2019 00:31  Glucose-Capillary Latest Ref Range: 70 - 99 mg/dL 293 (H)  U500 Insulin HELD 526 (HH)  100 units U500 Insulin 337 (H)  60 units U500 Insulin 181 (H)  20 units LANTUS given at 8:33pm 40 (LL) 111 (H)   Results for Kristy [name], Kristy [name] (MRN [id number]) as of 09/02/2019 14:17  Ref. Range 09/02/2019 07:46 09/02/2019 11:49  Glucose-Capillary Latest Ref Range: 70 - 99 mg/dL 312 (H)  60 units U500 Insulin 365 (H)  60 units U500 Insulin   Home DM Meds: Concentrated U500 Insulin QID (before meals and at bedtime)                                     <200- 0 units                                201-300- 20 units                                301-400- 50 units                                401-500- 90 units                                     >500- 100 units  Current Orders: Concentrated U500 Insulin 20-100 units TID with meals according to the following scale: 201-300- 30 units 301-400- 60 units 401-500- 100 units >500- 110 units      Lantus 12 units QHS      Novolog 0-5 units QHS per SSI      Note Lantus started last PM at bedtime.    Also note U500 Scale increased by 10 units for each level.  Unsure what caused the Hypoglycemic event last night at [place]?  Doubt it was the Lantus.  Suspect it was the 60 units of the U500 Insulin that was given at 4:30pm.  CBGs >300 mg/dl so far today.   MD- Please consider the following:  1. Increase Lantus to 15 units QHS  2. May want to try adding Novolog Moderate (0-15 units) SSI TID AC  as well     --Will follow patient during hospitalization--  Wyn Quaker RN, MSN, CDE Diabetes Coordinator Inpatient Glycemic Control Team Team Pager: [phone] (8a-5p)

## 2019-09-02 NOTE — Progress Notes (Signed)
Hypoglycemic Event  CBG: 40  Treatment: D50 25 mL (12.5 gm)  Symptoms: Shaky  Follow-up CBG: FEOF:1219 CBG Result:111  Possible Reasons for Event: inadequate meal intake     Kristy [name]

## 2019-09-02 NOTE — Progress Notes (Signed)
PROGRESS NOTE  Kristy [name]:[id number] DOB: Sep 14, 1951 DOA: 08/21/2019 PCP: Owens Loffler, MD  Brief History   68 year old woman PMH includes systolic CHF, Crohn's disease, chronic kidney disease, diabetes mellitus type 2 presented with altered mental status, hyperglycemia.  Glucose 1434 in ED.  Recently discharged 11/11 with DKA.  Reportedly noncompliant with medications.  Was admitted for acute encephalopathy secondary to hyperglycemic hyperosmolar state, concern for sepsis, acute kidney injury, possible coffee-ground emesis, chronic systolic CHF.  Was quickly transitioned off IV insulin.  Sepsis was ruled out and antibiotics were discontinued.  Noted to have confusion, impulsiveness.  Seen by psychiatry, patient felt to not have capacity.  A & P  Diabetes mellitus type 2, uncontrolled, with CKD stage III, hemoglobin A1c 10.9 --Remains labile with 1 L last night.  We will continue present regimen including Lantus and U5 100.   Anemia with drop in hemoglobin.  Previous history of coffee-ground emesis, not this admission.  No reported hematemesis or melena this admission.  Hemoglobin dropp thought to be secondary to hemodilution. --Stable.  Given no melena or blood loss, as well as stable hemoglobin, GI recommended no further evaluation suggested. --Continue aspirin and Plavix  Thrombocytopenia, intermittent.  Seen earlier in November as well as in July 2020. --No further inpatient evaluation suggested.  Follow-up as an outpatient.  Acute metabolic encephalopathy, cognitive decline, depression, lack of capacity.  Per daughter patient often skips medication.  Family requested psychiatry evaluation. --Seen by psychiatry with recommendation to continue Prozac, use Haldol or Ativan as needed.  Per psychiatry, patient not able to understand the details of her insulin regimen nor administered on a regular basis.,  Will need help on a daily basis to assist with medication delivery and  adherence.  Worsening cognition noted. --Seen in follow-up by psychiatry 11/26, impression was neurocognitive disorder and unspecified anxiety disorder.  Patient does not appear to understand severity of medical condition and is still lacking in capacity to make medical decisions and unable to take care of herself.  Chronic systolic CHF, LVEF 74-12%. Ivabradine held for AKI. --Continue carvedilol, Aldactone, Entresto.  PPD placed 11/27.  Read 11/29.  No reaction noted.  See progress note from diabetic coordinator 11/30.  Unfortunately ALF is refusing to take the patient because she is on sliding scale U5 100.  She has been on this for many years and converting this is felt to be quite difficult.  At this point her disposition is unclear until facility can be located that can assist the patient with the administration of her chronic longstanding insulin.  Guardianship is being pursued.  Resolved Hospital Problem list    Hyperglycemic hyperosmolar state associated acute metabolic encephalopathy.  Thought secondary to noncompliance.  Per documentation, daughter reported patient often skips medication.  Sepsis considered on admission, subsequently ruled out.  Covid negative. Urinalysis negative.  Chest x-ray mild bronchitis.  Procalcitonin negative.  Lactic acid resolved with hydration.  Blood cultures no growth to date.  Insignificant elevation of hyper sensitivity troponin.  No further evaluation suggested.  Acute kidney injury superimposed on CKD stage III   DVT prophylaxis: SCds Code Status: Full Family Communication: none Disposition Plan: SNF, remains stable.   Murray Hodgkins, MD  [facility] Direct contact: see [email or web address] (further directions at bottom of note if needed) 7PM-7AM contact night coverage as at bottom of note 09/02/2019, 7:20 PM  LOS: 11 days   Significant Hospital Events   . 11/19 admitted for hyperosmolar hyperglycemic state with acute metabolic  encephalopathy and  acute kidney injury . 11/20 off insulin infusion . 11/24, 11/26 psychiatry consultation.  Patient felt to not have capacity to care for herself medically.  SNF pursued. . 11/26 hemoglobin dropped from 15-11.1.  GI consulted.   Consults:  . Psychiatry . Gastroenterology   Procedures:  .   Significant Diagnostic Tests:  Marland Kitchen    Micro Data:  . Blood cultures no growth to date   Antimicrobials:  .   Interval History/Subjective  No new issues.  Objective   Vitals:  Vitals:   09/02/19 0303 09/02/19 1853  BP: 122/64 109/61  Pulse: 96 100  Resp: 16   Temp: 97.6 F (36.4 C) 98.3 F (36.8 C)  SpO2: 96% 99%    Exam:  Constitutional.  Appears calm, comfortable. Respiratory.  Clear to auscultation bilaterally.  No wheezes, rales or rhonchi.  Normal respiratory effort. Cardiovascular.  Regular rate and rhythm.  No murmur, rub or gallop.    I have personally reviewed the following:   Today's Data  CBG quite labile.  Low of 40 at midnight.  Fasting blood sugar 312.  Scheduled Meds: . aspirin EC  81 mg Oral Daily  . carvedilol  12.5 mg Oral BID  . Chlorhexidine Gluconate Cloth  6 each Topical Daily  . clopidogrel  75 mg Oral Q breakfast  . dextrose  25 g Intravenous STAT  . FLUoxetine  20 mg Oral Daily  . insulin aspart  0-5 Units Subcutaneous QHS  . insulin glargine  12 Units Subcutaneous QHS  . insulin regular human CONCENTRATED  20-100 Units Subcutaneous TID WC  . nystatin ointment   Topical BID  . pantoprazole  40 mg Oral BID  . sacubitril-valsartan  1 tablet Oral BID  . spironolactone  25 mg Oral Daily   Continuous Infusions:  Principal Problem:   Uncontrolled type 2 diabetes mellitus with stage 3 chronic kidney disease, with long-term current use of insulin (HCC) Active Problems:   Essential hypertension   LBBB (left bundle branch block)   Coronary artery disease, non-occlusive   Chronic systolic CHF (congestive heart failure) (HCC)   NICM  (nonischemic cardiomyopathy) (HCC)   Elevated troponin   Sepsis ([place])   Acute metabolic encephalopathy   Thrombocytopenia ([place])   LOS: 11 days   How to contact the [facility] Attending or Consulting provider 7A - 7P or covering provider during after hours [place], for this patient?  1. Check the care team in [facility] and look for a) attending/consulting TRH provider listed and b) the [facility] team listed 2. Log into [email or web address] and use [place]'s universal password to access. If you do not have the password, please contact the hospital operator. 3. Locate the [facility] provider you are looking for under [facility] and page to a number that you can be directly reached. 4. If you still have difficulty reaching the provider, please page the [facility] (Director on Call) for the Hospitalists listed on amion for assistance.

## 2019-09-02 NOTE — Progress Notes (Signed)
Physical Therapy Treatment Patient Details Name: Kristy [name] MRN: [id number] DOB: 11-May-1951 Today's Date: 09/02/2019    History of Present Illness 68 y.o. female was admitted again after a two week return home, noted to have AMS, acute encephalopathy, sepsis and coffee ground emesis.  Pt reports falls but has a great deal of issues with memory both acutely and LT, for example did not know her age.  PMHx:  CAD, nonischemic cardiomyopathy, DKA, DM, hematemesis, encephalopathy, hypernatremia, UTI with sepsis, CHF, AMS, EF 35-40%    PT Comments    Pt ready for session.  Pt able to transition out of bed and complete 2 laps around nursing unit without AD and min guard/supervision.  She does have one significant LOB but she is able to recover without outside assist.  Remained in recliner after session with chair alarm set.   Follow Up Recommendations  Home health PT;Supervision for mobility/OOB     Equipment Recommendations  None recommended by PT    Recommendations for Other Services       Precautions / Restrictions Precautions Precautions: Fall Restrictions Weight Bearing Restrictions: No    Mobility  Bed Mobility Overal bed mobility: Modified Independent Bed Mobility: Supine to Sit;Sit to Supine     Supine to sit: Modified independent (Device/Increase time);HOB elevated Sit to supine: Modified independent (Device/Increase time);HOB elevated   General bed mobility comments: No difficulties noted with bed mobility.  Transfers Overall transfer level: Needs assistance Equipment used: None Transfers: Sit to/from Stand Sit to Stand: Supervision         General transfer comment: strong transfers noted; steady  Ambulation/Gait Ambulation/Gait assistance: Supervision;Min guard Gait Distance (Feet): 320 Feet Assistive device: None Gait Pattern/deviations: Step-through pattern Gait velocity: mildly decreased   General Gait Details: mild increased B lateral sway but  overall steady; one LOB recovered without assist   Stairs             Wheelchair Mobility    Modified Rankin (Stroke Patients Only)       Balance Overall balance assessment: Needs assistance Sitting-balance support: No upper extremity supported;Feet supported Sitting balance-Leahy Scale: Normal Sitting balance - Comments: steady sitting reaching outside BOS   Standing balance support: No upper extremity supported;During functional activity Standing balance-Leahy Scale: Good Standing balance comment: mild increased B lateral sway during ambulation but overall steady                            Cognition Arousal/Alertness: Awake/alert Behavior During Therapy: WFL for tasks assessed/performed Overall Cognitive Status: No family/caregiver present to determine baseline cognitive functioning                                        Exercises      General Comments        Pertinent Vitals/Pain Pain Assessment: No/denies pain    Home Living                      Prior Function            PT Goals (current goals can now be found in the care plan section) Acute Rehab PT Goals Patient Stated Goal: go home PT Goal Formulation: With patient Time For Goal Achievement: 09/06/19 Potential to Achieve Goals: Good    Frequency    Min 2X/week  PT Plan Current plan remains appropriate    Co-evaluation              AM-PAC PT "6 Clicks" Mobility   Outcome Measure  Help needed turning from your back to your side while in a flat bed without using bedrails?: None Help needed moving from lying on your back to sitting on the side of a flat bed without using bedrails?: None Help needed moving to and from a bed to a chair (including a wheelchair)?: A Little Help needed standing up from a chair using your arms (e.g., wheelchair or bedside chair)?: A Little Help needed to walk in hospital room?: A Little Help needed climbing 3-5  steps with a railing? : A Little 6 Click Score: 20    End of Session Equipment Utilized During Treatment: Gait belt Activity Tolerance: Patient tolerated treatment well Patient left: in bed;with call bell/phone within reach;with bed alarm set Nurse Communication: Mobility status;Precautions       Time: 8110-3159 PT Time Calculation (min) (ACUTE ONLY): 13 min  Charges:  $Gait Training: 8-22 mins                     Chesley Noon, PTA 09/02/19, 10:50 AM

## 2019-09-03 LAB — GLUCOSE, CAPILLARY
Glucose-Capillary: 476 mg/dL — ABNORMAL HIGH (ref 70–99)
Glucose-Capillary: 439 mg/dL — ABNORMAL HIGH (ref 70–99)
Glucose-Capillary: 398 mg/dL — ABNORMAL HIGH (ref 70–99)
Glucose-Capillary: 178 mg/dL — ABNORMAL HIGH (ref 70–99)
Glucose-Capillary: 50 mg/dL — ABNORMAL LOW (ref 70–99)
Glucose-Capillary: 60 mg/dL — ABNORMAL LOW (ref 70–99)
Glucose-Capillary: 68 mg/dL — ABNORMAL LOW (ref 70–99)
Glucose-Capillary: 117 mg/dL — ABNORMAL HIGH (ref 70–99)

## 2019-09-03 MED ORDER — IVABRADINE HCL 5 MG PO TABS
5.0000 mg | ORAL_TABLET | Freq: Two times a day (BID) | ORAL | Status: DC
  Administered 2019-09-03 – 2019-09-23 (×41): 5 mg via ORAL
  Filled 2019-09-03 (×44): qty 1

## 2019-09-03 MED ORDER — PANTOPRAZOLE SODIUM 40 MG PO TBEC
40.0000 mg | DELAYED_RELEASE_TABLET | Freq: Every day | ORAL | Status: DC
  Administered 2019-09-03 – 2019-09-23 (×21): 40 mg via ORAL
  Filled 2019-09-03 (×22): qty 1

## 2019-09-03 MED ORDER — INSULIN GLARGINE 100 UNIT/ML ~~LOC~~ SOLN
15.0000 [IU] | Freq: Every day | SUBCUTANEOUS | Status: DC
  Administered 2019-09-03: 15 [IU] via SUBCUTANEOUS
  Filled 2019-09-03 (×2): qty 0.15

## 2019-09-03 NOTE — Progress Notes (Addendum)
Inpatient Diabetes Program Recommendations  AACE/ADA: New Consensus Statement on Inpatient Glycemic Control (2015)  Target Ranges:  Prepandial:   less than 140 mg/dL      Peak postprandial:   less than 180 mg/dL (1-2 hours)      Critically ill patients:  140 - 180 mg/dL   Results for Kristy [name], Kristy [name] (MRN [id number]) as of 09/03/2019 08:37  Ref. Range 09/01/2019 07:32 09/01/2019 12:08 09/01/2019 16:30 09/01/2019 21:20 09/02/2019 00:06 09/02/2019 00:31  Glucose-Capillary Latest Ref Range: 70 - 99 mg/dL 293 (H)  U500 Insulin NOT given 526 (HH)  100 units U500 Insulin 337 (H)  60 units U500 Insulin 181 (H)  12 units LANTUS 40 (LL) 111 (H)   Results for Kristy [name], Kristy [name] (MRN [id number]) as of 09/03/2019 08:37  Ref. Range 09/02/2019 07:46 09/02/2019 11:49 09/02/2019 16:53 09/02/2019 20:04  Glucose-Capillary Latest Ref Range: 70 - 99 mg/dL 312 (H)  60 units U500 Insulin 365 (H)  60 units U500 Insulin 148 (H) 118 (H)  LANTUS NOT given--Per MAR, Pt refused   Results for Kristy [name], Kristy [name] (MRN [id number]) as of 09/03/2019 08:37  Ref. Range 09/03/2019 07:52  Glucose-Capillary Latest Ref Range: 70 - 99 mg/dL 476 (H)  100 units U500 Insulin     Home DM Meds:Concentrated U500 Insulin QID (before meals and at bedtime) <200- 0 units 201-300- 20 units 301-400- 50 units 401-500- 90 units >500- 100 units  Current Orders:Concentrated U500 Insulin 20-100 units TID with meals according to the following scale: 201-300- 30 units 301-400- 60 units 401-500- 100 units >500- 110 units                            Lantus 15 units QHS     MD- Please note that Lantus was NOT given last PM.  Per MAR, pt refused to take the dose.  CBG 476 mg/dl this AM.  Lantus at bedtime was added on 11/30 to try to help get AM CBGs back to  WNL.  Note Lantus dose increased to 15 units QHS for tonight.    --Will follow patient during hospitalization--  Wyn Quaker RN, MSN, CDE Diabetes Coordinator Inpatient Glycemic Control Team Team Pager: [phone] (8a-5p)

## 2019-09-03 NOTE — Progress Notes (Addendum)
[place] [facility] [place] NAME: Kristy [name]    MR#:  [id number]  DATE OF BIRTH:  Sep 10, 1951  SUBJECTIVE:  patient reports her sugars are high today. She does not like that. Denies any other complaints. Reports having a good night and good day yesterday. Looking forward to have a better day today. No new complaints. Sugars  476 this morning.  REVIEW OF SYSTEMS:   Review of Systems  Constitutional: Negative for chills, fever and weight loss.  HENT: Negative for ear discharge, ear pain and nosebleeds.   Eyes: Negative for blurred vision, pain and discharge.  Respiratory: Negative for sputum production, shortness of breath, wheezing and stridor.   Cardiovascular: Negative for chest pain, palpitations, orthopnea and PND.  Gastrointestinal: Negative for abdominal pain, diarrhea, nausea and vomiting.  Genitourinary: Negative for frequency and urgency.  Musculoskeletal: Negative for back pain and joint pain.  Neurological: Negative for sensory change, speech change, focal weakness and weakness.  Psychiatric/Behavioral: Negative for depression and hallucinations. The patient is nervous/anxious.    Tolerating Diet: yes Tolerating PT: ambulatory  DRUG ALLERGIES:   Allergies  Allergen Reactions  . Fish Oil Other (See Comments)    Gout  . Glimepiride Other (See Comments)    REACTION: hypoglycemia  . Guanfacine Hcl Other (See Comments)    REACTION: unspecified  . Rosiglitazone Other (See Comments)    CHF    VITALS:  Blood pressure 111/78, pulse (!) 103, temperature 98.8 F (37.1 C), temperature source Oral, resp. rate 20, height 5' 4"  (1.626 m), weight 65 kg, SpO2 96 %.  PHYSICAL EXAMINATION:   Physical Exam  GENERAL:  68 y.o.-year-old patient lying in the bed with no acute distress.  EYES: Pupils equal, round, reactive to light and accommodation. No scleral icterus. Extraocular muscles intact.  HEENT: Head atraumatic, normocephalic.  Oropharynx and nasopharynx clear.  NECK:  Supple, no jugular venous distention. No thyroid enlargement, no tenderness.  LUNGS: Normal breath sounds bilaterally, no wheezing, rales, rhonchi. No use of accessory muscles of respiration.  CARDIOVASCULAR: S1, S2 normal. No murmurs, rubs, or gallops.  ABDOMEN: Soft, nontender, nondistended. Bowel sounds present. No organomegaly or mass.  EXTREMITIES: No cyanosis, clubbing or edema b/l.    NEUROLOGIC: Cranial nerves II through XII are intact. No focal Motor or sensory deficits b/l.   PSYCHIATRIC:  patient is alert and awake SKIN: No obvious rash, lesion, or ulcer.   LABORATORY PANEL:  CBC Recent Labs  Lab 08/29/19 0719  WBC 10.0  HGB 11.1*  HCT 33.4*  PLT 137*    Chemistries  Recent Labs  Lab 08/28/19 0416  NA 142  K 3.5  CL 110  CO2 24  GLUCOSE 277*  BUN 14  CREATININE 0.71  CALCIUM 8.8*   Cardiac Enzymes No results for input(s): TROPONINI in the last 168 hours. RADIOLOGY:  No results found. ASSESSMENT AND PLAN:   68 year old woman PMH includes systolic CHF, Crohn's disease, chronic kidney disease, diabetes mellitus type 2 presented with altered mental status, hyperglycemia.  Glucose 1434 in ED.  Recently discharged 11/11 with DKA.  Reportedly noncompliant with medications.  Was admitted for acute encephalopathy secondary to hyperglycemic hyperosmolar state.  Diabetes mellitus type 2, uncontrolled, with CKD stage III, hemoglobin A1c 10.9 - We will continue present regimen including Lantus and U5 100.  -Lantus increased to 15 units -appreciate diabetes coordinator input  Anemia with drop in hemoglobin.  Previous history of coffee-ground emesis, not this admission.  No reported  hematemesis or melena this admission.  Hemoglobin drop thought to be secondary to hemodilution. -Stable.  Given no melena or blood loss -GI recommended no further evaluation suggested. -Continue aspirin and Plavix -continue  PPI  MildThrombocytopenia, intermittent.  Seen earlier in November as well as in July 2020. --No further inpatient evaluation suggested.  Follow-up as an outpatient.  Acute metabolic encephalopathy, cognitive decline, depression, lack of capacity.   -Per stepdaughter patient often skips medication with non compliance -Seen by psychiatry with recommendation to continue Prozac, use Haldol or Ativan as needed.   -Per psychiatry, patient not able to understand the details of her insulin regimen nor administered on a regular basis. Will need help on a daily basis to assist with medication delivery and adherence.  -Seen in follow-up by psychiatry 11/26, impression was neurocognitive disorder and unspecified anxiety disorder.  Patient does not appear to understand severity of medical condition and is still lacking in capacity to make medical decisions and unable to take care of herself. -Working on assisted living facility that will administer insulin.  Chronic systolic CHF, LVEF 39-53%. -Continue carvedilol, Aldactone, Entresto and ivabradine. -Patient not in heart failure -follows with [facility] cardiology  PPD placed 11/27.  Read 11/29.  No reaction noted.  Family communication : left msg for [employer]  Consults :Psych Discharge Disposition :ALF --d/w CSW to find out today if she can be accepted at ALF who can administer Insulin CODE STATUS: FULL DVT Prophylaxis :SCD  TOTAL TIME TAKING CARE OF THIS PATIENT: *30* minutes.  >50% time spent on counselling and coordination of care    Note: This dictation was prepared with Dragon dictation along with smaller phrase technology. Any transcriptional errors that result from this process are unintentional.  [name] M.D on 09/03/2019 at 8:16 AM  Between 7am to 6pm - Pager - [phone]  After 6pm go to [email or web address]  [facility]   CC: Primary care physician; Owens Loffler, MDPatient ID: Kristy [name], female   DOB:  08/28/1951, 68 y.o.   MRN: [id number]

## 2019-09-03 NOTE — Progress Notes (Signed)
Physical Therapy Treatment Patient Details Name: Kristy [name] MRN: [id number] DOB: 04-04-1951 Today's Date: 09/03/2019    History of Present Illness 68 y.o. female was admitted again after a two week return home, noted to have AMS, acute encephalopathy, sepsis and coffee ground emesis.  Pt reports falls but has a great deal of issues with memory both acutely and LT, for example did not know her age.  PMHx:  CAD, nonischemic cardiomyopathy, DKA, DM, hematemesis, encephalopathy, hypernatremia, UTI with sepsis, CHF, AMS, EF 35-40%    PT Comments    Pt up in chair when PT entered, agreeable to work with PT, no complaints of pain. Pt expressed frustration throughout session about current situation including frustration with family, care, and rehab. Active listening and education provided as able. The patient ambulated ~314f total with supervision and performed x10 sit to stands for LE and functional strengthening, most challenged with sit to stands (very mild instability and SOB noted during/after exercise). Pt up in chair with chair alarm on and all needs in reach.     Follow Up Recommendations  Home health PT;Supervision for mobility/OOB     Equipment Recommendations  None recommended by PT    Recommendations for Other Services OT consult     Precautions / Restrictions Precautions Precautions: Fall Restrictions Weight Bearing Restrictions: No    Mobility  Bed Mobility               General bed mobility comments: Pt up in chair  Transfers Overall transfer level: Needs assistance Equipment used: None Transfers: Sit to/from Stand Sit to Stand: Supervision         General transfer comment: pt impulsive, but no unsteadiness/LOB noted  Ambulation/Gait Ambulation/Gait assistance: Supervision Gait Distance (Feet): 325 Feet Assistive device: None       General Gait Details: Overall steady, no LOB noted this session. Pt verbalized frustration over current situation  throughout mobility   Stairs             Wheelchair Mobility    Modified Rankin (Stroke Patients Only)       Balance Overall balance assessment: Needs assistance Sitting-balance support: Feet supported Sitting balance-Leahy Scale: Normal       Standing balance-Leahy Scale: Good                              Cognition Arousal/Alertness: Awake/alert Behavior During Therapy: WFL for tasks assessed/performed Overall Cognitive Status: No family/caregiver present to determine baseline cognitive functioning                                        Exercises Other Exercises Other Exercises: Pt instructed in sit to stands, performed x10 without UE support    General Comments        Pertinent Vitals/Pain Pain Assessment: No/denies pain    Home Living                      Prior Function            PT Goals (current goals can now be found in the care plan section) Progress towards PT goals: Progressing toward goals    Frequency    Min 2X/week      PT Plan Current plan remains appropriate    Co-evaluation  AM-PAC PT "6 Clicks" Mobility   Outcome Measure  Help needed turning from your back to your side while in a flat bed without using bedrails?: None Help needed moving from lying on your back to sitting on the side of a flat bed without using bedrails?: None Help needed moving to and from a bed to a chair (including a wheelchair)?: A Little Help needed standing up from a chair using your arms (e.g., wheelchair or bedside chair)?: A Little Help needed to walk in hospital room?: A Little Help needed climbing 3-5 steps with a railing? : A Little 6 Click Score: 20    End of Session Equipment Utilized During Treatment: Gait belt Activity Tolerance: Patient tolerated treatment well Patient left: in chair;with chair alarm set;with call bell/phone within reach Nurse Communication: Mobility status PT  Visit Diagnosis: Unsteadiness on feet (R26.81);History of falling (Z91.81);Difficulty in walking, not elsewhere classified (R26.2);Muscle weakness (generalized) (M62.81)     Time: 3817-7116 PT Time Calculation (min) (ACUTE ONLY): 11 min  Charges:  $Therapeutic Exercise: 8-22 mins                    Lieutenant Diego PT, DPT 2:32 PM,09/03/19 [phone]

## 2019-09-03 NOTE — TOC Progression Note (Signed)
Transition of Care [facility]) - Progression Note    Patient Details  Name: Kristy [name] MRN: [id number] Date of Birth: 04/22/1951  Transition of Care [facility]) CM/SW [place], [place] Phone Number: 09/03/2019, 3:51 PM  Clinical Narrative:     CSW called [facility]. Patient was accepted. They will need a COVID test within 72 hours and documentation of a TB test and cognitive impairment diagnosis.  CSW contacted pt's niece, Joellen Jersey. Joellen Jersey stated that she is not her HCPOA anymore. She gave it up. She stated that the pt's stepdaughter, Jocelyn Lamer, is now in charge of her care.  CSW contacted pt's stepdaughter, Jocelyn Lamer. Jocelyn Lamer stated that the patient has been admitted to the hospital 5 times. She refuses to take her insulin and will fire all help that comes in (home health and sitters). She refuses to let anyone in the home. Pt's stepdaughter got APS involved. Guardianship hearing is on 12/8. Guardianship referral was accepted but just waiting on the hearing. Right now, patient is her own guardian even though hospital doctors stated that she cannot make decisions for herself.  CSW met with patient. Patient refuses to go to [facility]. She states that she can commit to home health and 24/7 supervision. She denied every "firing" anyone and stated that her stepdaughter, Jocelyn Lamer, is not her real daughter and just trying to steal her money. She stated that she has 1 million dollars.   CSW contacted APS worker, Milus Glazier [phone]) who stated that APS cannot take emergency guardianship since the stepdaughter is in the process of trying to obtain guardianship. She stated that it is not a good discharge plan to send the patient home because she has a chronic history of not taking care of herself and not taking her insulin and getting to near death numbers.  Patient is open with [facility]; unsure at this moment if they will take her back. Due to not being sure what the plan is due to  family and APS not being comfortable with the patient going home because she has a history of not being compliant with care or medications.    CSW resent out for SNF placement referral. [facility] denied due to "behavioral issues"  Expected Discharge Plan: [place] Barriers to Discharge: Ship broker, Continued Medical Work up  Expected Discharge Plan and Services Expected Discharge Plan: [place] Choice: [place] arrangements for the past 2 months: Single Family Home                                       Social Determinants of Health (SDOH) Interventions    Readmission Risk Interventions Readmission Risk Prevention Plan 04/28/2019 04/27/2019  Post Dischage Appt - Complete  Medication Screening - Complete  Transportation Screening - Complete  PCP or Specialist Appt within 5-7 Days Complete -  Home Care Screening Complete -  Medication Review (RN CM) Complete -  Some recent data might be hidden

## 2019-09-03 NOTE — Progress Notes (Signed)
RN notified MD pt BG was 476 at 0725 and 439 at 1115. RN has been covering pt with sliding scale insulin. RN will continue to assess and monitor pt.

## 2019-09-03 NOTE — NC FL2 (Addendum)
[place] LEVEL OF CARE SCREENING TOOL     IDENTIFICATION  Patient Name: Kristy [name] Birthdate: Jun 26, 1951 Sex: female Admission Date (Current Location): 08/21/2019  [place] and [place] Number:  Engineering geologist and Address:  [facility], [address], [place],  [address]      Provider Number: [id number]  Attending Physician Name and Address:  [name], MD  Relative Name and Phone Number:  Lucius [facility]) Niece   [phone] or [place], [place] Daughter   [phone]    Current Level of Care: Hospital Recommended Level of Care: [place] Prior Approval Number:    Date Approved/Denied: 07/09/19 PASRR Number: [id number]  Discharge Plan: SNF    Current Diagnoses: Patient Active Problem List   Diagnosis Date Noted  . Thrombocytopenia ([place]) 08/29/2019  . Sepsis ([place]) 08/22/2019  . Acute metabolic encephalopathy 41/11/129  . Hypokalemia   . Hypomagnesemia   . Encounter for competency evaluation   . Weakness   . Hypotension   . Elevated troponin   . Sepsis secondary to UTI ([place])   . DKA (diabetic ketoacidoses) ([place]) 04/19/2019  . NICM (nonischemic cardiomyopathy) ([place]) 11/08/2018  . Chronic systolic CHF (congestive heart failure) ([place]) 08/22/2018  . AKI (acute kidney injury) ([place]) 05/13/2018  . Coronary artery disease, non-occlusive 09/13/2016  . Primary osteoarthritis involving multiple joints 03/14/2015  . Personal history of other malignant neoplasm of skin 12/01/2014  . Crohn's disease ([place]) 10/13/2013  . Major depressive disorder, recurrent, in full remission ([place]) 05/01/2011  . TRANSAMINASES, SERUM, ELEVATED 05/01/2010  . PSORIASIS 08/02/2009  . Hyperlipidemia 05/11/2009  . GOUT 02/05/2009  . Uncontrolled type 2 diabetes mellitus with stage 3 chronic kidney disease, with long-term current use of insulin ([place])   . Essential hypertension 04/18/2007  . LBBB (left bundle branch  block) 04/18/2007  . ALLERGIC RHINITIS 04/18/2007    Orientation RESPIRATION BLADDER Height & Weight     Self, Place  Normal Continent Weight: 65 kg Height:  5' 4"  (162.6 cm)  BEHAVIORAL SYMPTOMS/MOOD NEUROLOGICAL BOWEL NUTRITION STATUS  Other (Comment) (None) Continent Diet  AMBULATORY STATUS COMMUNICATION OF NEEDS Skin   Independent Verbally Normal                       Personal Care Assistance Level of Assistance  Bathing, Feeding Bathing Assistance: Limited assistance Feeding assistance: Limited assistance Dressing Assistance: Limited assistance     Functional Limitations Info  Sight, Hearing, Speech Sight Info: Adequate Hearing Info: Adequate Speech Info: Adequate    SPECIAL CARE FACTORS FREQUENCY  PT (By licensed PT), OT (By licensed OT)     PT Frequency: Home healh minimum 2x a week OT Frequency: Home health minimum 2x a week            Contractures Contractures Info: Present    Additional Factors Info  Code Status, Allergies, Insulin Sliding Scale, Psychotropic Code Status Info: Full Code Allergies Info: Fish oil, Glimepiride, Guanfacine Hcl, Rosigilatzone Psychotropic Info: FLUoxetine (PROZAC) capsule 66m Insulin Sliding Scale Info: Concentrated U500 Insulin 20-100 units TID with meals       Current Medications (09/03/2019):  This is the current hospital active medication list Current Facility-Administered Medications  Medication Dose Route Frequency Provider Last Rate Last Dose  . aspirin EC tablet 81 mg  81 mg Oral Daily GSamuella Cota MD   81 mg at 09/03/19 0810  . carvedilol (COREG) tablet 12.5 mg  12.5 mg Oral BID NBlaine Hamper  Soledad Gerlach, MD   12.5 mg at 09/03/19 0810  . Chlorhexidine Gluconate Cloth 2 % PADS 6 each  6 each Topical Daily Ivor Costa, MD   6 each at 09/03/19 [phone]  . clopidogrel (PLAVIX) tablet 75 mg  75 mg Oral Q breakfast Samuella Cota, MD   75 mg at 09/03/19 0810  . dextrose 50 % solution 0-50 mL  0-50 mL Intravenous PRN Ivor Costa, MD   25 mL at 09/02/19 0012  . FLUoxetine (PROZAC) capsule 20 mg  20 mg Oral Daily Ivor Costa, MD   20 mg at 09/03/19 2023  . hydrALAZINE (APRESOLINE) tablet 25 mg  25 mg Oral TID PRN Ivor Costa, MD      . insulin glargine (LANTUS) injection 15 Units  15 Units Subcutaneous QHS [name], MD      . insulin regular human CONCENTRATED (HUMULIN R) 500 UNIT/ML kwikpen 20-100 Units  20-100 Units Subcutaneous TID WC Samuella Cota, MD   100 Units at 09/03/19 1130  . ivabradine (CORLANOR) tablet 5 mg  5 mg Oral BID WC [name], MD   5 mg at 09/03/19 0908  . nystatin ointment (MYCOSTATIN)   Topical BID Ivor Costa, MD      . pantoprazole (PROTONIX) EC tablet 40 mg  40 mg Oral Daily [name], MD   40 mg at 09/03/19 0810  . sacubitril-valsartan (ENTRESTO) 49-51 mg per tablet  1 tablet Oral BID Samuella Cota, MD   1 tablet at 09/03/19 3435  . spironolactone (ALDACTONE) tablet 25 mg  25 mg Oral Daily Ivor Costa, MD   25 mg at 09/03/19 6861     Discharge Medications: Please see discharge summary for a list of discharge medications.  Relevant Imaging Results:  Relevant Lab Results:   Additional Information SSN [id number]  Trecia Rogers, LCSW

## 2019-09-04 LAB — GLUCOSE, CAPILLARY
Glucose-Capillary: 324 mg/dL — ABNORMAL HIGH (ref 70–99)
Glucose-Capillary: 341 mg/dL — ABNORMAL HIGH (ref 70–99)
Glucose-Capillary: 243 mg/dL — ABNORMAL HIGH (ref 70–99)
Glucose-Capillary: 132 mg/dL — ABNORMAL HIGH (ref 70–99)

## 2019-09-04 MED ORDER — INSULIN GLARGINE 100 UNIT/ML ~~LOC~~ SOLN
20.0000 [IU] | Freq: Every day | SUBCUTANEOUS | Status: DC
  Filled 2019-09-04 (×2): qty 0.2

## 2019-09-04 MED ORDER — INSULIN GLARGINE 100 UNIT/ML ~~LOC~~ SOLN
25.0000 [IU] | Freq: Every day | SUBCUTANEOUS | Status: DC
  Filled 2019-09-04: qty 0.25

## 2019-09-04 NOTE — Plan of Care (Signed)
Patient doing well today.  FSBSs are staying elevated today.  Tolerating her meals well.  She has been sitting up in the chair most of the day.

## 2019-09-04 NOTE — Care Management Important Message (Signed)
Important Message  Patient Details  Name: Kristy [name] MRN: [id number] Date of Birth: 1950/12/19   Medicare Important Message Given:  Yes     [name] 09/04/2019, 1:07 PM

## 2019-09-04 NOTE — Progress Notes (Signed)
Inpatient Diabetes Program Recommendations  AACE/ADA: New Consensus Statement on Inpatient Glycemic Control   Target Ranges:  Prepandial:   less than 140 mg/dL      Peak postprandial:   less than 180 mg/dL (1-2 hours)      Critically ill patients:  140 - 180 mg/dL  Results for Kristy [name], Kristy [name] (MRN [id number]) as of 09/04/2019 07:53  Ref. Range 09/03/2019 07:52 09/03/2019 11:15 09/03/2019 11:40 09/03/2019 16:55 09/03/2019 18:22 09/03/2019 21:36 09/03/2019 22:06 09/03/2019 22:24 09/03/2019 23:18  Glucose-Capillary Latest Ref Range: 70 - 99 mg/dL 476 (H)  U500 100 units 439 (H)   398 (H)  U500 100 units 178 (H)    Lantus 15 units 50 (L) 60 (L) 68 (L) 117 (H)    Review of Glycemic Control  Diabetes history: DM2 Outpatient Diabetes medications: Humulin RU500 0-100 units QID (with meals and at bedtime) per SSI <200- 0 units 201-300- 20 units 300-400- 50 units 401-500- 90 units >500- 100 units Current orders for Inpatient glycemic control: Lantus 15 units QHS, Humulin R U500 0-110 units TID with meals per SSI <200- 0 units  201-300- 30 units  301-400- 60 units  401-500- 100 units  >500- 110 units     Inpatient Diabetes Program Recommendations:   Insulin-Please discontinue Humulin R U500 insulin and change Lantus to 25 units BID (starting this am), CBGs Q4H (for closer glucose monitoring), Novolog 0-20 units Q4H, Novolog 10 units TID with meals for meal coverage if patient eats at least 50% of meals.  NOTE: Glucose has been variable since admitted and ranged from 50-476 mg/dl over the past 24 hours. Question if patient is absorbing such large doses of Humulin R U500 insulin and if glucose would be better controlled on set dose of Lantus and Novolog meal coverage. Recommend discontinuing Humulin R U500 and using Lantus, Novolog meal coverage, and  Novolog correction. Would ask that CBGs and Novolog correction be Q4H for closer glucose monitoring and correcting if needed.  Thanks, Barnie Alderman, RN, MSN, CDE Diabetes Coordinator Inpatient Diabetes Program [phone] (Team Pager from 8am to 5pm)

## 2019-09-04 NOTE — Progress Notes (Addendum)
[place] at [place] NAME: Kristy [name]    MR#:  [id number]  DATE OF BIRTH:  01-15-1951  SUBJECTIVE:  Looking forward to have a better day today. No new complaints. Sugars have been labile Took her Insulin as directed. Eating ok REVIEW OF SYSTEMS:   Review of Systems  Constitutional: Negative for chills, fever and weight loss.  HENT: Negative for ear discharge, ear pain and nosebleeds.   Eyes: Negative for blurred vision, pain and discharge.  Respiratory: Negative for sputum production, shortness of breath, wheezing and stridor.   Cardiovascular: Negative for chest pain, palpitations, orthopnea and PND.  Gastrointestinal: Negative for abdominal pain, diarrhea, nausea and vomiting.  Genitourinary: Negative for frequency and urgency.  Musculoskeletal: Negative for back pain and joint pain.  Neurological: Negative for sensory change, speech change, focal weakness and weakness.  Psychiatric/Behavioral: Negative for depression and hallucinations. The patient is nervous/anxious.    Tolerating Diet: yes Tolerating PT: ambulatory  DRUG ALLERGIES:   Allergies  Allergen Reactions  . Fish Oil Other (See Comments)    Gout  . Glimepiride Other (See Comments)    REACTION: hypoglycemia  . Guanfacine Hcl Other (See Comments)    REACTION: unspecified  . Rosiglitazone Other (See Comments)    CHF    VITALS:  Blood pressure 112/67, pulse 87, temperature 98.7 F (37.1 C), temperature source Oral, resp. rate 18, height 5' 4"  (1.626 m), weight 65 kg, SpO2 94 %.  PHYSICAL EXAMINATION:   Physical Exam  GENERAL:  68 y.o.-year-old patient lying in the bed with no acute distress.  EYES: Pupils equal, round, reactive to light and accommodation. No scleral icterus. Extraocular muscles intact.  HEENT: Head atraumatic, normocephalic. Oropharynx and nasopharynx clear.  NECK:  Supple, no jugular venous distention. No thyroid enlargement, no tenderness.   LUNGS: Normal breath sounds bilaterally, no wheezing, rales, rhonchi. No use of accessory muscles of respiration.  CARDIOVASCULAR: S1, S2 normal. No murmurs, rubs, or gallops.  ABDOMEN: Soft, nontender, nondistended. Bowel sounds present. No organomegaly or mass.  EXTREMITIES: No cyanosis, clubbing or edema b/l.    NEUROLOGIC: Cranial nerves II through XII are intact. No focal Motor or sensory deficits b/l.   PSYCHIATRIC:  patient is alert and awake SKIN: No obvious rash, lesion, or ulcer.   LABORATORY PANEL:  CBC Recent Labs  Lab 08/29/19 0719  WBC 10.0  HGB 11.1*  HCT 33.4*  PLT 137*    Chemistries  No results for input(s): NA, K, CL, CO2, GLUCOSE, BUN, CREATININE, CALCIUM, MG, AST, ALT, ALKPHOS, BILITOT in the last 168 hours.  Invalid input(s): GFRCGP Cardiac Enzymes No results for input(s): TROPONINI in the last 168 hours. RADIOLOGY:  No results found. ASSESSMENT AND PLAN:   68 year old woman PMH includes systolic CHF, Crohn's disease, chronic kidney disease, diabetes mellitus type 2 presented with altered mental status, hyperglycemia.  Glucose 1434 in ED.  Recently discharged 11/11 with DKA.  Reportedly noncompliant with medications.  Was admitted for acute encephalopathy secondary to hyperglycemic hyperosmolar state.  Diabetes mellitus type 2, uncontrolled, with CKD stage III, hemoglobin A1c 10.9 - We will continue present regimen including Lantus and U5 100.  -Lantus increased to 20 units -appreciate diabetes coordinator input-- I am not too comfortable at present changing her U500 to novolog since the mechanism of action is same other than onset of action. Will consider change if no improvement. -pt has a very LABILE diabetes.  Anemia with drop in hemoglobin.  Previous history  of coffee-ground emesis, not this admission.  No reported hematemesis or melena this admission.  Hemoglobin drop thought to be secondary to hemodilution. -Stable hgb 11.1  -GI recommended no  further evaluation suggested. -Continue aspirin and Plavix -continue PPI  MildThrombocytopenia, intermittent.  Seen earlier in November as well as in July 2020. --No further inpatient evaluation suggested.  Follow-up as an outpatient.  Acute metabolic encephalopathy, cognitive decline, depression, lack of capacity -Per stepdaughter patient often skips medication with non compliance -Seen by psychiatry with recommendation to continue Prozac, use Haldol or Ativan as needed.   -Per psychiatry, patient not able to understand the details of her insulin regimen nor administered on a regular basis. Will need help on a daily basis to assist with medication delivery and adherence.  -Seen in follow-up by psychiatry 11/26, impression has Neurocognitive disorder and unspecified anxiety disorder.  Patient does not appear to understand severity of medical condition and is  lacking in capacity to make medical decisions and unable to take care of herself. -please review CSW notes regarding pt's discharge planning.  Chronic systolic CHF, LVEF 76-16%. -Continue carvedilol, Aldactone, Entresto and ivabradine. -Patient not in heart failure -follows with [facility] cardiology  PPD placed 11/27.  Read 11/29.  No reaction noted.  Family communication : CSW has been in touch with step-dter --vicki Consults :Psych Discharge Disposition :TBD pt has a challenging discharge planning pending guardianship, she NOT competent to take care of herself. CODE STATUS: FULL DVT Prophylaxis :SCD  TOTAL TIME TAKING CARE OF THIS PATIENT: *30* minutes.  >50% time spent on counselling and coordination of care   Note: This dictation was prepared with Dragon dictation along with smaller phrase technology. Any transcriptional errors that result from this process are unintentional.  [name] M.D on 09/04/2019 at 9:20 AM  Between 7am to 6pm - Pager - [phone]  After 6pm go to [email or web address]  [facility]    CC: Primary care physician; Owens Loffler, MDPatient ID: [name], female   DOB: Oct 02, 1951, 68 y.o.   MRN: [id number]

## 2019-09-05 LAB — GLUCOSE, CAPILLARY
Glucose-Capillary: 270 mg/dL — ABNORMAL HIGH (ref 70–99)
Glucose-Capillary: 314 mg/dL — ABNORMAL HIGH (ref 70–99)
Glucose-Capillary: 518 mg/dL (ref 70–99)
Glucose-Capillary: 481 mg/dL — ABNORMAL HIGH (ref 70–99)
Glucose-Capillary: 247 mg/dL — ABNORMAL HIGH (ref 70–99)
Glucose-Capillary: 57 mg/dL — ABNORMAL LOW (ref 70–99)
Glucose-Capillary: 99 mg/dL (ref 70–99)

## 2019-09-05 MED ORDER — INSULIN GLARGINE 100 UNIT/ML ~~LOC~~ SOLN
25.0000 [IU] | Freq: Every day | SUBCUTANEOUS | Status: DC
  Filled 2019-09-05: qty 0.25

## 2019-09-05 MED ORDER — INSULIN GLARGINE 100 UNIT/ML ~~LOC~~ SOLN
20.0000 [IU] | Freq: Two times a day (BID) | SUBCUTANEOUS | Status: DC
  Administered 2019-09-05 – 2019-09-06 (×2): 20 [IU] via SUBCUTANEOUS
  Filled 2019-09-05 (×4): qty 0.2

## 2019-09-05 NOTE — Progress Notes (Signed)
[place] at [place] NAME: Kristy [name]    MR#:  [id number]  DATE OF BIRTH:  [birth year]  SUBJECTIVE:  Looking forward to have a better day today. No new complaints. Sugars have been labile Took her Insulin as directed. Eating ok REVIEW OF SYSTEMS:   Review of Systems  Constitutional: Negative for chills, fever and weight loss.  HENT: Negative for ear discharge, ear pain and nosebleeds.   Eyes: Negative for blurred vision, pain and discharge.  Respiratory: Negative for sputum production, shortness of breath, wheezing and stridor.   Cardiovascular: Negative for chest pain, palpitations, orthopnea and PND.  Gastrointestinal: Negative for abdominal pain, diarrhea, nausea and vomiting.  Genitourinary: Negative for frequency and urgency.  Musculoskeletal: Negative for back pain and joint pain.  Neurological: Negative for sensory change, speech change, focal weakness and weakness.  Psychiatric/Behavioral: Negative for depression and hallucinations. The patient is nervous/anxious.    Tolerating Diet: yes Tolerating PT: ambulatory  DRUG ALLERGIES:   Allergies  Allergen Reactions  . Fish Oil Other (See Comments)    Gout  . Glimepiride Other (See Comments)    REACTION: hypoglycemia  . Guanfacine Hcl Other (See Comments)    REACTION: unspecified  . Rosiglitazone Other (See Comments)    CHF    VITALS:  Blood pressure (!) 95/54, pulse 75, temperature 98.1 F (36.7 C), temperature source Oral, resp. rate 16, height 5' 4"  (1.626 m), weight 65 kg, SpO2 98 %.  PHYSICAL EXAMINATION:   Physical Exam  GENERAL:  68 y.o.-year-old patient lying in the bed with no acute distress.  EYES: Pupils equal, round, reactive to light and accommodation. No scleral icterus. Extraocular muscles intact.  HEENT: Head atraumatic, normocephalic. Oropharynx and nasopharynx clear.  NECK:  Supple, no jugular venous distention. No thyroid enlargement, no  tenderness.  LUNGS: Normal breath sounds bilaterally, no wheezing, rales, rhonchi. No use of accessory muscles of respiration.  CARDIOVASCULAR: S1, S2 normal. No murmurs, rubs, or gallops.  ABDOMEN: Soft, nontender, nondistended. Bowel sounds present. No organomegaly or mass.  EXTREMITIES: No cyanosis, clubbing or edema b/l.    NEUROLOGIC: Cranial nerves II through XII are intact. No focal Motor or sensory deficits b/l.   PSYCHIATRIC:  patient is alert and awake SKIN: No obvious rash, lesion, or ulcer.   LABORATORY PANEL:  CBC No results for input(s): WBC, HGB, HCT, PLT in the last 168 hours.  Chemistries  No results for input(s): NA, K, CL, CO2, GLUCOSE, BUN, CREATININE, CALCIUM, MG, AST, ALT, ALKPHOS, BILITOT in the last 168 hours.  Invalid input(s): GFRCGP Cardiac Enzymes No results for input(s): TROPONINI in the last 168 hours. RADIOLOGY:  No results found. ASSESSMENT AND PLAN:   68 year old woman PMH includes systolic CHF, Crohn's disease, chronic kidney disease, diabetes mellitus type 2 presented with altered mental status, hyperglycemia.  Glucose 1434 in ED.  Recently discharged 11/11 with DKA.  Reportedly noncompliant with medications.  Was admitted for acute encephalopathy secondary to hyperglycemic hyperosmolar state.  Diabetes mellitus type 2, uncontrolled, with CKD stage III, hemoglobin A1c 10.9 - We will continue present regimen including Lantus and U5 100.  -Lantus increased to 20 units bid  -appreciate diabetes coordinator input -pt has a very LABILE diabetes.  Anemia with drop in hemoglobin.   -Stable hgb 11.1  -GI recommended no further evaluation suggested. -Continue aspirin and Plavix -continue PPI  MildThrombocytopenia, intermittent.  Seen earlier in November as well as in July 2020. --No further inpatient evaluation suggested.  Follow-up as an outpatient.  Acute metabolic encephalopathy, cognitive decline, depression, lack of capacity.   -Per  stepdaughter patient often skips medication with non compliance -Seen by psychiatry with recommendation to continue Prozac, use Haldol or Ativan as needed.   -Per psychiatry, patient not able to understand the details of her insulin regimen nor administered on a regular basis. Will need help on a daily basis to assist with medication delivery and adherence.  -Seen in follow-up by psychiatry 11/26, impression was neurocognitive disorder and unspecified anxiety disorder.  Patient does not appear to understand severity of medical condition and is still lacking in capacity to make medical decisions and unable to take care of herself. -please review CSW notesregarding pt's discharge planning. -CSW director Alroy Dust is involved with discharge planning as well. I am waiting to hear back.  Chronic systolic CHF, LVEF 34-19%. -Continue carvedilol, Aldactone, Entresto and ivabradine. -Patient not in heart failure -follows with [facility] cardiology  PPD placed 11/27.  Read 11/29.  No reaction noted.  Family communication : CSW has been in touch with step-dter --vicki Consults :Psych Discharge Disposition :TBD pt has a challenging discharge planning pending guardianship, she NOT competent to take care of herself. CODE STATUS: FULL DVT Prophylaxis :SCD  TOTAL TIME TAKING CARE OF THIS PATIENT: *25* minutes.  >50% time spent on counselling and coordination of care   Note: This dictation was prepared with Dragon dictation along with smaller phrase technology. Any transcriptional errors that result from this process are unintentional.  [name] M.D on 09/05/2019 at 12:06 PM  Between 7am to 6pm - Pager - [phone]  After 6pm go to [email or web address]  [facility]   CC: Primary care physician; Owens Loffler, MDPatient ID: Kristy [name], female   DOB: 12/30/1950, 68 y.o.   MRN: [id number]

## 2019-09-05 NOTE — Progress Notes (Signed)
PT Cancellation Note  Patient Details Name: Kristy [name] MRN: [id number] DOB: 1951-02-22   Cancelled Treatment:    Reason Eval/Treat Not Completed: Other (comment)(Pt most recent blood sugar reading 481. PT to hold at this per PT guidelines, to follow up when patient is more appropriate for exertional activity.)   Lieutenant Diego PT, DPT 3:44 PM,09/05/19 [phone]

## 2019-09-05 NOTE — NC FL2 (Signed)
[place] LEVEL OF CARE SCREENING TOOL     IDENTIFICATION  Patient Name: Kristy [name] Birthdate: 1951-04-20 Sex: female Admission Date (Current Location): 08/21/2019  [place] and [place] Number:  Engineering geologist and Address:  [facility], [address] [place], [place] [address]      Provider Number: [id number]  Attending Physician Name and Address:  [name], MD  Relative Name and Phone Number:  Kirat, Mezquita  Step Daughter   [phone]    Current Level of Care: Hospital Recommended Level of Care: Memory Care Prior Approval Number:    Date Approved/Denied: 07/09/19 PASRR Number: [id number]  Discharge Plan: SNF    Current Diagnoses:Acute metabolic encephalopathy, cognitive decline, depression, lack of capacity Patient Active Problem List   Diagnosis Date Noted  . Thrombocytopenia ([place]) 08/29/2019  . Sepsis ([place]) 08/22/2019  . Acute metabolic encephalopathy 93/79/0240  . Hypokalemia   . Hypomagnesemia   . Encounter for competency evaluation   . Weakness   . Hypotension   . Elevated troponin   . Sepsis secondary to UTI ([place])   . DKA (diabetic ketoacidoses) ([place]) 04/19/2019  . NICM (nonischemic cardiomyopathy) ([place]) 11/08/2018  . Chronic systolic CHF (congestive heart failure) ([place]) 08/22/2018  . AKI (acute kidney injury) ([place]) 05/13/2018  . Coronary artery disease, non-occlusive 09/13/2016  . Primary osteoarthritis involving multiple joints 03/14/2015  . Personal history of other malignant neoplasm of skin 12/01/2014  . Crohn's disease ([place]) 10/13/2013  . Major depressive disorder, recurrent, in full remission ([place]) 05/01/2011  . TRANSAMINASES, SERUM, ELEVATED 05/01/2010  . PSORIASIS 08/02/2009  . Hyperlipidemia 05/11/2009  . GOUT 02/05/2009  . Uncontrolled type 2 diabetes mellitus with stage 3 chronic kidney disease, with long-term current use of insulin ([place])   . Essential hypertension 04/18/2007  .  LBBB (left bundle branch block) 04/18/2007  . ALLERGIC RHINITIS 04/18/2007    Orientation RESPIRATION BLADDER Height & Weight     Self, Place  Normal Continent Weight: 65 kg Height:  5' 4"  (162.6 cm)  BEHAVIORAL SYMPTOMS/MOOD NEUROLOGICAL BOWEL NUTRITION STATUS  Other (Comment) (None) Continent Diet  AMBULATORY STATUS COMMUNICATION OF NEEDS Skin   Independent Verbally Normal                       Personal Care Assistance Level of Assistance  Bathing, Feeding Bathing Assistance: Limited assistance Feeding assistance: Limited assistance Dressing Assistance: Limited assistance     Functional Limitations Info  Sight, Hearing, Speech Sight Info: Adequate Hearing Info: Adequate Speech Info: Adequate    SPECIAL CARE FACTORS FREQUENCY  PT (By licensed PT), OT (By licensed OT)     PT Frequency: Home healh minimum 2x a week OT Frequency: Home health minimum 2x a week            Contractures Contractures Info: Present    Additional Factors Info  Code Status, Allergies, Insulin Sliding Scale, Psychotropic Code Status Info: Full Code Allergies Info: Fish oil, Glimepiride, Guanfacine Hcl, Rosigilatzone Psychotropic Info: FLUoxetine (PROZAC) capsule 59m Insulin Sliding Scale Info: Concentrated U500 Insulin 20-100 units TID with meals       Current Medications (09/05/2019):  This is the current hospital active medication list Current Facility-Administered Medications  Medication Dose Route Frequency Provider Last Rate Last Dose  . aspirin EC tablet 81 mg  81 mg Oral Daily GSamuella Cota MD   81 mg at 09/05/19 09735 . carvedilol (COREG) tablet 12.5 mg  12.5 mg Oral BID  Ivor Costa, MD   12.5 mg at 09/05/19 3832  . clopidogrel (PLAVIX) tablet 75 mg  75 mg Oral Q breakfast Samuella Cota, MD   75 mg at 09/05/19 9191  . dextrose 50 % solution 0-50 mL  0-50 mL Intravenous PRN Ivor Costa, MD   25 mL at 09/02/19 0012  . FLUoxetine (PROZAC) capsule 20 mg  20 mg Oral  Daily Ivor Costa, MD   20 mg at 09/05/19 6606  . hydrALAZINE (APRESOLINE) tablet 25 mg  25 mg Oral TID PRN Ivor Costa, MD      . insulin glargine (LANTUS) injection 20 Units  20 Units Subcutaneous BID [name], MD   20 Units at 09/05/19 1040  . insulin regular human CONCENTRATED (HUMULIN R) 500 UNIT/ML kwikpen 20-100 Units  20-100 Units Subcutaneous TID WC Samuella Cota, MD   110 Units at 09/05/19 1305  . ivabradine (CORLANOR) tablet 5 mg  5 mg Oral BID WC [name], MD   5 mg at 09/05/19 0925  . nystatin ointment (MYCOSTATIN)   Topical BID Ivor Costa, MD      . pantoprazole (PROTONIX) EC tablet 40 mg  40 mg Oral Daily [name], MD   40 mg at 09/05/19 0927  . sacubitril-valsartan (ENTRESTO) 49-51 mg per tablet  1 tablet Oral BID Samuella Cota, MD   1 tablet at 09/05/19 0045  . spironolactone (ALDACTONE) tablet 25 mg  25 mg Oral Daily Ivor Costa, MD   25 mg at 09/05/19 9977     Discharge Medications: Please see discharge summary for a list of discharge medications.  Relevant Imaging Results:  Relevant Lab Results:   Additional Information SSN [id number]  Beverly Sessions, RN

## 2019-09-05 NOTE — Progress Notes (Signed)
   08/31/19 1815  PPD Results  Does patient have an induration at the injection site? No  Induration(mm) 2 mm  Name of Physician Notified assess By MD this morning at 2A per [facility]

## 2019-09-05 NOTE — TOC Progression Note (Signed)
Transition of Care [facility]) - Progression Note    Patient Details  Name: Kristy [name] MRN: [id number] Date of Birth: Feb 14, 1951  [facility] CM/SW Contact  Beverly Sessions, RN Phone Number: 09/05/2019, 2:13 PM  Clinical Narrative:    RNCM has reached out to leadership team and MD to discusses discharge disposition for patient.   It is documented that patient lacks capacity to make her medical decision.  RNCM spoke with step daughter Maxwell Caul who is working with APS to obtain Legal Guardianship of person. She has a hearing on 12/8.  On Monday she will be following up with DSS to see if there is a way to expedite the process.   Per Jonelle Sidle and Barnetta Chapel at [facility] they are still able to offer a bed for the patient.  FL2 will need to be updated to reflect memory care, cognitive diagnosis listed, and negative TB results.  Per Tiffany patient would be on quarantine when arrive to the facility, so she would requires a 24 hours sitter.  Tiffany states they need at least 24 hour notice to arrange sitters.  They also do not take admissions over the weekend.  [name] would have to sign a contract with Nanine Means which would include accepting financial responsibilities.  Milus Glazier from DSS was at bedside speaking with patient.  Patient still adamantly refuses placement.  RNCM has reached out to department leadership to determine if Loletha Carrow is able to make decisions for the patient, prior to the hearing as patient lacks capacity.   Per Evelena Leyden is only pursing guardian of person.  DSS has recommended that she allow the states to take guardianship of the estate. At this point there is no one is appointed to managed the patient fiances.  Per T[facility] at [facility], they would not be able to accept the patient until financial responsibility has been determined.    - FL2 updated to memory care and cognitive impairment updated  -negative results from PPD pulled into a progress  notes from flow sheets - MD to place order for repeat covid on Sunday  MD, Magda Paganini [phone])  with APS, and [name] update  Expected Discharge Plan: [place] Barriers to Discharge: Ship broker, Continued Medical Work up  Expected Discharge Plan and Services Expected Discharge Plan: [place] Choice: [place] arrangements for the past 2 months: Single Family Home                                       Social Determinants of Health (SDOH) Interventions    Readmission Risk Interventions Readmission Risk Prevention Plan 04/28/2019 04/27/2019  Post Dischage Appt - Complete  Medication Screening - Complete  Transportation Screening - Complete  PCP or Specialist Appt within 5-7 Days Complete -  Home Care Screening Complete -  Medication Review (RN CM) Complete -  Some recent data might be hidden

## 2019-09-06 LAB — GLUCOSE, CAPILLARY
Glucose-Capillary: 118 mg/dL — ABNORMAL HIGH (ref 70–99)
Glucose-Capillary: 265 mg/dL — ABNORMAL HIGH (ref 70–99)
Glucose-Capillary: 320 mg/dL — ABNORMAL HIGH (ref 70–99)
Glucose-Capillary: 346 mg/dL — ABNORMAL HIGH (ref 70–99)
Glucose-Capillary: 200 mg/dL — ABNORMAL HIGH (ref 70–99)
Glucose-Capillary: 114 mg/dL — ABNORMAL HIGH (ref 70–99)
Glucose-Capillary: 107 mg/dL — ABNORMAL HIGH (ref 70–99)

## 2019-09-06 MED ORDER — INSULIN GLARGINE 100 UNIT/ML ~~LOC~~ SOLN
22.0000 [IU] | Freq: Two times a day (BID) | SUBCUTANEOUS | Status: DC
  Administered 2019-09-07 – 2019-09-08 (×2): 22 [IU] via SUBCUTANEOUS
  Filled 2019-09-06 (×5): qty 0.22

## 2019-09-06 NOTE — Progress Notes (Signed)
Kristy [name] at [place] NAME: Kristy [name]    MR#:  [id number]  DATE OF BIRTH:  16-Oct-1950  SUBJECTIVE:   No new complaints. Sugars have been stable but one reading of 500 yday Took her Insulin as directed. Eating ok REVIEW OF SYSTEMS:   Review of Systems  Constitutional: Negative for chills, fever and weight loss.  HENT: Negative for ear discharge, ear pain and nosebleeds.   Eyes: Negative for blurred vision, pain and discharge.  Respiratory: Negative for sputum production, shortness of breath, wheezing and stridor.   Cardiovascular: Negative for chest pain, palpitations, orthopnea and PND.  Gastrointestinal: Negative for abdominal pain, diarrhea, nausea and vomiting.  Genitourinary: Negative for frequency and urgency.  Musculoskeletal: Negative for back pain and joint pain.  Neurological: Negative for sensory change, speech change, focal weakness and weakness.  Psychiatric/Behavioral: Negative for depression and hallucinations. The patient is nervous/anxious.    Tolerating Diet: yes Tolerating PT: ambulatory  DRUG ALLERGIES:   Allergies  Allergen Reactions  . Fish Oil Other (See Comments)    Gout  . Glimepiride Other (See Comments)    REACTION: hypoglycemia  . Guanfacine Hcl Other (See Comments)    REACTION: unspecified  . Rosiglitazone Other (See Comments)    CHF    VITALS:  Blood pressure (!) 117/56, pulse 67, temperature 98.1 F (36.7 C), temperature source Oral, resp. rate 20, height 5' 4"  (1.626 m), weight 65 kg, SpO2 99 %.  PHYSICAL EXAMINATION:   Physical Exam  GENERAL:  68 y.o.-year-old patient lying in the bed with no acute distress.  EYES: Pupils equal, round, reactive to light and accommodation. No scleral icterus. Extraocular muscles intact.  HEENT: Head atraumatic, normocephalic. Oropharynx and nasopharynx clear.  NECK:  Supple, no jugular venous distention. No thyroid enlargement, no tenderness.  LUNGS:  Normal breath sounds bilaterally, no wheezing, rales, rhonchi. No use of accessory muscles of respiration.  CARDIOVASCULAR: S1, S2 normal. No murmurs, rubs, or gallops.  ABDOMEN: Soft, nontender, nondistended. Bowel sounds present. No organomegaly or mass.  EXTREMITIES: No cyanosis, clubbing or edema b/l.    NEUROLOGIC: Cranial nerves II through XII are intact. No focal Motor or sensory deficits b/l.   PSYCHIATRIC:  patient is alert and awake SKIN: No obvious rash, lesion, or ulcer.   LABORATORY PANEL:  CBC No results for input(s): WBC, HGB, HCT, PLT in the last 168 hours.  Chemistries  No results for input(s): NA, K, CL, CO2, GLUCOSE, BUN, CREATININE, CALCIUM, MG, AST, ALT, ALKPHOS, BILITOT in the last 168 hours.  Invalid input(s): GFRCGP Cardiac Enzymes No results for input(s): TROPONINI in the last 168 hours. RADIOLOGY:  No results found. ASSESSMENT AND PLAN:   68 year old woman PMH includes systolic CHF, Crohn's disease, chronic kidney disease, diabetes mellitus type 2 presented with altered mental status, hyperglycemia.  Glucose 1434 in ED.  Recently discharged 11/11 with DKA.  Reportedly noncompliant with medications.  Was admitted for acute encephalopathy secondary to hyperglycemic hyperosmolar state.  Diabetes mellitus type 2, uncontrolled, with CKD stage III, hemoglobin A1c 10.9 - We will continue present regimen including Lantus and U5 100.  -Lantus increased to 20 units bid  -appreciate diabetes coordinator input -pt has a very LABILE diabetes.  Anemia with drop in hemoglobin.   -Stable hgb 11.1  -GI recommended no further evaluation suggested. -Continue aspirin and Plavix -continue PPI  MildThrombocytopenia, intermittent.  Seen earlier in November as well as in July 2020. --No further inpatient evaluation suggested.  Follow-up as an outpatient.  Acute metabolic encephalopathy, cognitive decline, depression, lack of capacity.   -Per stepdaughter patient often  skips medication with non compliance -Seen by psychiatry with recommendation to continue Prozac, use Haldol or Ativan as needed.   -Per psychiatry, patient not able to understand the details of her insulin regimen nor administered on a regular basis. Will need help on a daily basis to assist with medication delivery and adherence.  -Seen in follow-up by psychiatry 11/26, impression was neurocognitive disorder and unspecified anxiety disorder.  Patient does not appear to understand severity of medical condition and is still lacking in capacity to make medical decisions and unable to take care of herself. -please review CSW notesregarding pt's discharge planning--COMPLEX d/c planning  Chronic systolic CHF, LVEF 95-63%. -Continue carvedilol, Aldactone, Entresto and ivabradine. -Patient not in heart failure -follows [facility] cardiology  PPD placed 11/27.  Read 11/29.  No reaction noted.  Family communication : CSW has been in touch with step-dter --vicki Consults :Psych Discharge Disposition :TBD pt has a challenging discharge planning pending guardianship, she NOT competent to take care of herself. CODE STATUS: FULL DVT Prophylaxis :SCD  TOTAL TIME TAKING CARE OF THIS PATIENT: *25* minutes.  >50% time spent on counselling and coordination of care   Note: This dictation was prepared with Dragon dictation along with smaller phrase technology. Any transcriptional errors that result from this process are unintentional.  Kristy [name] M.D on 09/06/2019 at 2:37 PM  Between 7am to 6pm - Pager - [phone]  After 6pm go to [email or web address]  [facility]   CC: Primary care physician; Owens Loffler, MDPatient ID: Kristy [name], female   DOB: 1951/07/23, 68 y.o.   MRN: [id number]

## 2019-09-06 NOTE — Progress Notes (Signed)
Physical Therapy Treatment Patient Details Name: Kristy [name] MRN: [id number] DOB: 05/16/1951 Today's Date: 09/06/2019    History of Present Illness 68 y.o. female was admitted again after a two week return home, noted to have AMS, acute encephalopathy, sepsis and coffee ground emesis.  Pt reports falls but has a great deal of issues with memory both acutely and LT, for example did not know her age.  PMHx:  CAD, nonischemic cardiomyopathy, DKA, DM, hematemesis, encephalopathy, hypernatremia, UTI with sepsis, CHF, AMS, EF 35-40%    PT Comments    Ready for session.  Pt ambulated 900' around nursing unit with no And generally steady gait.  Occasionally reaches for handrail.  Stated she is walking around in her room now with no difficulty.   Follow Up Recommendations  Home health PT;Supervision for mobility/OOB     Equipment Recommendations  None recommended by PT    Recommendations for Other Services       Precautions / Restrictions Precautions Precautions: Fall Restrictions Weight Bearing Restrictions: No LLE Weight Bearing: Partial weight bearing    Mobility  Bed Mobility Overal bed mobility: Modified Independent                Transfers Overall transfer level: Modified independent                  Ambulation/Gait Ambulation/Gait assistance: Modified independent (Device/Increase time) Gait Distance (Feet): 900 Feet Assistive device: None   Gait velocity: mildly decreased   General Gait Details: overall steady with no LOB noted  Used handrails in hallway on occasion,   Stairs             Wheelchair Mobility    Modified Rankin (Stroke Patients Only)       Balance Overall balance assessment: Modified Independent;Mild deficits observed, not formally tested                                          Cognition Arousal/Alertness: Awake/alert Behavior During Therapy: WFL for tasks assessed/performed Overall Cognitive  Status: Within Functional Limits for tasks assessed                                        Exercises      General Comments        Pertinent Vitals/Pain Pain Assessment: No/denies pain    Home Living                      Prior Function            PT Goals (current goals can now be found in the care plan section) Progress towards PT goals: Progressing toward goals    Frequency    Min 2X/week      PT Plan Current plan remains appropriate    Co-evaluation              AM-PAC PT "6 Clicks" Mobility   Outcome Measure  Help needed turning from your back to your side while in a flat bed without using bedrails?: None Help needed moving from lying on your back to sitting on the side of a flat bed without using bedrails?: None Help needed moving to and from a bed to a chair (including a wheelchair)?: None Help needed standing up from a  chair using your arms (e.g., wheelchair or bedside chair)?: None Help needed to walk in hospital room?: None Help needed climbing 3-5 steps with a railing? : A Little 6 Click Score: 23    End of Session Equipment Utilized During Treatment: Gait belt Activity Tolerance: Patient tolerated treatment well Patient left: in chair;with call bell/phone within reach Nurse Communication: Mobility status       Time: 3754-3606 PT Time Calculation (min) (ACUTE ONLY): 15 min  Charges:  $Gait Training: 8-22 mins                    Chesley Noon, PTA 09/06/19, 11:01 AM

## 2019-09-07 LAB — GLUCOSE, CAPILLARY
Glucose-Capillary: 319 mg/dL — ABNORMAL HIGH (ref 70–99)
Glucose-Capillary: 483 mg/dL — ABNORMAL HIGH (ref 70–99)
Glucose-Capillary: 168 mg/dL — ABNORMAL HIGH (ref 70–99)
Glucose-Capillary: 101 mg/dL — ABNORMAL HIGH (ref 70–99)

## 2019-09-07 LAB — SARS CORONAVIRUS 2 (TAT 6-24 HRS): SARS Coronavirus 2: NEGATIVE

## 2019-09-07 NOTE — Progress Notes (Signed)
[place] at [place] NAME: Kristy [name]    MR#:  [id number]  DATE OF BIRTH:  04-15-1951  SUBJECTIVE:   Pt is  upset about her discharge plan. Sugars are stable overall Took her Insulin as directed. Eating ok REVIEW OF SYSTEMS:   Review of Systems  Constitutional: Negative for chills, fever and weight loss.  HENT: Negative for ear discharge, ear pain and nosebleeds.   Eyes: Negative for blurred vision, pain and discharge.  Respiratory: Negative for sputum production, shortness of breath, wheezing and stridor.   Cardiovascular: Negative for chest pain, palpitations, orthopnea and PND.  Gastrointestinal: Negative for abdominal pain, diarrhea, nausea and vomiting.  Genitourinary: Negative for frequency and urgency.  Musculoskeletal: Negative for back pain and joint pain.  Neurological: Negative for sensory change, speech change, focal weakness and weakness.  Psychiatric/Behavioral: Negative for depression and hallucinations. The patient is nervous/anxious.    Tolerating Diet: yes Tolerating PT: ambulatory  DRUG ALLERGIES:   Allergies  Allergen Reactions  . Fish Oil Other (See Comments)    Gout  . Glimepiride Other (See Comments)    REACTION: hypoglycemia  . Guanfacine Hcl Other (See Comments)    REACTION: unspecified  . Rosiglitazone Other (See Comments)    CHF    VITALS:  Blood pressure 125/66, pulse 67, temperature (!) 97.1 F (36.2 C), resp. rate 16, height 5' 4"  (1.626 m), weight 65 kg, SpO2 98 %.  PHYSICAL EXAMINATION:   Physical Exam  GENERAL:  68 y.o.-year-old patient lying in the bed with no acute distress.  EYES: Pupils equal, round, reactive to light and accommodation. No scleral icterus. Extraocular muscles intact.  HEENT: Head atraumatic, normocephalic. Oropharynx and nasopharynx clear.  NECK:  Supple, no jugular venous distention. No thyroid enlargement, no tenderness.  LUNGS: Normal breath sounds bilaterally,  no wheezing, rales, rhonchi. No use of accessory muscles of respiration.  CARDIOVASCULAR: S1, S2 normal. No murmurs, rubs, or gallops.  ABDOMEN: Soft, nontender, nondistended. Bowel sounds present. No organomegaly or mass.  EXTREMITIES: No cyanosis, clubbing or edema b/l.    NEUROLOGIC: Cranial nerves II through XII are intact. No focal Motor or sensory deficits b/l.   PSYCHIATRIC:  patient is alert and awake SKIN: No obvious rash, lesion, or ulcer.   LABORATORY PANEL:  CBC No results for input(s): WBC, HGB, HCT, PLT in the last 168 hours.  Chemistries  No results for input(s): NA, K, CL, CO2, GLUCOSE, BUN, CREATININE, CALCIUM, MG, AST, ALT, ALKPHOS, BILITOT in the last 168 hours.  Invalid input(s): GFRCGP Cardiac Enzymes No results for input(s): TROPONINI in the last 168 hours. RADIOLOGY:  No results found. ASSESSMENT AND PLAN:   68 year old woman PMH includes systolic CHF, Crohn's disease, chronic kidney disease, diabetes mellitus type 2 presented with altered mental status, hyperglycemia.  Glucose 1434 in ED.  Recently discharged 11/11 with DKA.  Reportedly noncompliant with medications.  Was admitted for acute encephalopathy secondary to hyperglycemic hyperosmolar state.  Diabetes mellitus type 2, uncontrolled, with CKD stage III, hemoglobin A1c 10.9 - We will continue present regimen including Lantus and U5 100.  -Lantus increased to 20 units bid  -appreciate diabetes coordinator input -pt has a very LABILE diabetes.  Anemia with drop in hemoglobin.   -Stable hgb 11.1  -GI recommended no further evaluation suggested. -Continue aspirin and Plavix -continue PPI  MildThrombocytopenia, intermittent.  Seen earlier in November as well as in July 2020. --No further inpatient evaluation suggested.  Follow-up as an outpatient.  Acute metabolic encephalopathy, cognitive decline, depression, lack of capacity.   -Per stepdaughter patient often skips medication with non  compliance -Seen by psychiatry with recommendation to continue Prozac, use Haldol or Ativan as needed.   -Per psychiatry, patient not able to understand the details of her insulin regimen nor administered on a regular basis. Will need help on a daily basis to assist with medication delivery and adherence.  -Seen in follow-up by psychiatry 11/26, impression was neurocognitive disorder and unspecified anxiety disorder.  Patient does not appear to understand severity of medical condition and is still lacking in capacity to make medical decisions and unable to take care of herself. -please review CSW notesregarding pt's discharge planning--COMPLEX d/c planning  Chronic systolic CHF, LVEF 95-62%. -Continue carvedilol, Aldactone, Entresto and ivabradine. -follows with [facility] cardiology  PPD placed 11/27.  Read 11/29.  No reaction noted.  Family communication : CSW has been in touch with step-dter --Kristy [name] Consults :Psych Discharge Disposition :TBD pt has a challenging discharge planning pending guardianship, she NOT competent to take care of herself. CODE STATUS: FULL DVT Prophylaxis :SCD  TOTAL TIME TAKING CARE OF THIS PATIENT: *20* minutes.  >50% time spent on counselling and coordination of care   Note: This dictation was prepared with Dragon dictation along with smaller phrase technology. Any transcriptional errors that result from this process are unintentional.  [name] M.D on 09/07/2019 at 12:37 PM  Between 7am to 6pm - Pager - [phone]  After 6pm go to [email or web address]  [facility]   CC: Primary care physician; Owens Loffler, MDPatient ID: Kristy [name], female   DOB: [birth year], 68 y.o.   MRN: [id number]

## 2019-09-08 LAB — CULTURE, BLOOD (ROUTINE X 2)
Culture: NO GROWTH
Culture: NO GROWTH
Special Requests: ADEQUATE
Special Requests: ADEQUATE

## 2019-09-08 LAB — GLUCOSE, CAPILLARY
Glucose-Capillary: 169 mg/dL — ABNORMAL HIGH (ref 70–99)
Glucose-Capillary: 324 mg/dL — ABNORMAL HIGH (ref 70–99)
Glucose-Capillary: 237 mg/dL — ABNORMAL HIGH (ref 70–99)
Glucose-Capillary: 122 mg/dL — ABNORMAL HIGH (ref 70–99)

## 2019-09-08 MED ORDER — INSULIN GLARGINE 100 UNIT/ML ~~LOC~~ SOLN
25.0000 [IU] | Freq: Two times a day (BID) | SUBCUTANEOUS | Status: DC
  Administered 2019-09-08 – 2019-09-11 (×4): 25 [IU] via SUBCUTANEOUS
  Filled 2019-09-08 (×8): qty 0.25

## 2019-09-08 NOTE — TOC Progression Note (Signed)
Transition of Care [facility]) - Progression Note    Patient Details  Name: Kristy [name] MRN: [id number] Date of Birth: [birth year]  [facility] CM/SW Contact  Shade Flood, LCSW Phone Number: 09/08/2019, 4:12 PM  Clinical Narrative:     TOC following. Spoke with step daughter, Loletha Carrow, today regarding pt's situation. Per Loletha Carrow, her court hearing is not until 10/14/19. [name] is only filing for guardianship of person. If Loletha Carrow is granted guardianship, then the court will assign a guardian of the estate, probably an attorney. [name] does not want to be the financial guardian and she is agreeable to the appointment of a separate guardian of the estate.   Spoke with Magda Paganini from [place]. Per Magda Paganini, the case with APS is now closed as pt has a family member willing to help and pt is not in immediate danger now that her condition has stabilized.   [name] states that the hospital should dc pt home if we think she is safe to do so. [name] expresses concern that pt will return home and not take her insulin correctly as this is what has happened four separate times now.   Pt is not agreeable to placement at [facility] and cannot be admitted to [facility] memory care unless she has a personal and a financial guardian in place. The earliest this would be in place is likely 10/14/19.   Reviewed with [facility] supervisor who recommended calling APS. APS says the case is closed.  Primary TOC will follow up 12/8. If pt returns home, recommend [facility] RN for medication teaching and also MSW for social concerns.    Expected Discharge Plan: Memory Care Barriers to Discharge: Insurance Authorization, Continued Medical Work up  Expected Discharge Plan and Services Expected Discharge Plan: [place] Acute Care Choice: [place] Living arrangements for the past 2 months: Single Family Home                                       Social Determinants of Health (SDOH)  Interventions    Readmission Risk Interventions Readmission Risk Prevention Plan 04/28/2019 04/27/2019  Post Dischage Appt - Complete  Medication Screening - Complete  Transportation Screening - Complete  PCP or Specialist Appt within 5-7 Days Complete -  Home Care Screening Complete -  Medication Review (RN CM) Complete -  Some recent data might be hidden

## 2019-09-08 NOTE — Progress Notes (Signed)
Nutrition Brief Note  Patient identified for LOS  68 year old woman PMH includes systolic CHF, Crohn's disease, chronic kidney disease, diabetes mellitus type 2 presented with altered mental status, hyperglycemia. Glucose 1434 in ED. Recently discharged 11/11 with DKA. Reportedly noncompliant with medications. Was admitted for acute encephalopathy secondary to hyperglycemic hyperosmolar state.  Wt Readings from Last 15 Encounters:  08/31/19 65 kg  08/14/19 64.5 kg  07/31/19 72.6 kg  07/21/19 66.2 kg  07/08/19 68 kg  07/03/19 70.3 kg  06/11/19 70.8 kg  05/19/19 70.8 kg  05/09/19 68.5 kg  04/28/19 68.9 kg  02/12/19 69.9 kg  11/08/18 68.6 kg  10/30/18 69.9 kg  10/16/18 71.2 kg  09/13/18 72.8 kg    Body mass index is 24.61 kg/m. Patient meets criteria for normal weight based on current BMI.   Current diet order is HH/CHO modified, patient is consuming approximately 100% of meals at this time. Labs and medications reviewed.  Pt is awaiting guardianship. Not appropriate for diet education.    No nutrition interventions warranted at this time. If nutrition issues arise, please consult RD.   Koleen Distance MS, RD, LDN Pager #- [phone] Office#- [phone] After Hours Pager: [phone]

## 2019-09-08 NOTE — Progress Notes (Signed)
[place] at [place] NAME: Kristy [name]    MR#:  [id number]  DATE OF BIRTH:  [birth year]  SUBJECTIVE:   Pt is  upset about her discharge plan. Sugars are stable overall Took her Insulin as directed. Eating ok REVIEW OF SYSTEMS:   Review of Systems  Constitutional: Negative for chills, fever and weight loss.  HENT: Negative for ear discharge, ear pain and nosebleeds.   Eyes: Negative for blurred vision, pain and discharge.  Respiratory: Negative for sputum production, shortness of breath, wheezing and stridor.   Cardiovascular: Negative for chest pain, palpitations, orthopnea and PND.  Gastrointestinal: Negative for abdominal pain, diarrhea, nausea and vomiting.  Genitourinary: Negative for frequency and urgency.  Musculoskeletal: Negative for back pain and joint pain.  Neurological: Negative for sensory change, speech change, focal weakness and weakness.  Psychiatric/Behavioral: Negative for depression and hallucinations. The patient is nervous/anxious.    Tolerating Diet: yes Tolerating PT: ambulatory  DRUG ALLERGIES:   Allergies  Allergen Reactions  . Fish Oil Other (See Comments)    Gout  . Glimepiride Other (See Comments)    REACTION: hypoglycemia  . Guanfacine Hcl Other (See Comments)    REACTION: unspecified  . Rosiglitazone Other (See Comments)    CHF    VITALS:  Blood pressure (!) 107/59, pulse 76, temperature 98.4 F (36.9 C), temperature source Oral, resp. rate 20, height 5' 4"  (1.626 m), weight 65 kg, SpO2 98 %.  PHYSICAL EXAMINATION:   Physical Exam  GENERAL:  68 y.o.-year-old patient lying in the bed with no acute distress.  EYES: Pupils equal, round, reactive to light and accommodation. No scleral icterus. Extraocular muscles intact.  HEENT: Head atraumatic, normocephalic. Oropharynx and nasopharynx clear.  NECK:  Supple, no jugular venous distention. No thyroid enlargement, no tenderness.  LUNGS: Normal  breath sounds bilaterally, no wheezing, rales, rhonchi. No use of accessory muscles of respiration.  CARDIOVASCULAR: S1, S2 normal. No murmurs, rubs, or gallops.  ABDOMEN: Soft, nontender, nondistended. Bowel sounds present. No organomegaly or mass.  EXTREMITIES: No cyanosis, clubbing or edema b/l.    NEUROLOGIC: Cranial nerves II through XII are intact. No focal Motor or sensory deficits b/l.   PSYCHIATRIC:  patient is alert and awake SKIN: No obvious rash, lesion, or ulcer.   LABORATORY PANEL:  CBC No results for input(s): WBC, HGB, HCT, PLT in the last 168 hours.  Chemistries  No results for input(s): NA, K, CL, CO2, GLUCOSE, BUN, CREATININE, CALCIUM, MG, AST, ALT, ALKPHOS, BILITOT in the last 168 hours.  Invalid input(s): GFRCGP Cardiac Enzymes No results for input(s): TROPONINI in the last 168 hours. RADIOLOGY:  No results found. ASSESSMENT AND PLAN:   68 year old woman PMH includes systolic CHF, Crohn's disease, chronic kidney disease, diabetes mellitus type 2 presented with altered mental status, hyperglycemia.  Glucose 1434 in ED.  Recently discharged 11/11 with DKA.  Reportedly noncompliant with medications.  Was admitted for acute encephalopathy secondary to hyperglycemic hyperosmolar state.  Diabetes mellitus type 2, uncontrolled, with CKD stage III -hemoglobin A1c 10.9 - We will continue present regimen including Lantus and U5 100.  -Lantus increased to 25 units bid  -pt has a very LABILE diabetes.  Anemia with drop in hemoglobin.   -Stable hgb 11.1  -GI recommended no further evaluation suggested. -Continue aspirin and Plavix -continue PPI  Acute metabolic encephalopathy, cognitive decline, depression, lack of capacity.   -Per stepdaughter patient often skips medication with non compliance -Seen by psychiatry with  recommendation to continue Prozac, use Haldol or Ativan as needed.   -Per psychiatry, patient not able to understand the details of her insulin  regimen nor administered on a regular basis. Will need help on a daily basis to assist with medication delivery and adherence.  -Seen in follow-up by psychiatry 11/26, impression was neurocognitive disorder and unspecified anxiety disorder.  Patient does not appear to understand severity of medical condition and is still lacking in capacity to make medical decisions and unable to take care of herself. -please review CSW notesregarding pt's discharge planning--COMPLEX d/c planning  Chronic systolic CHF, LVEF 97-35%. -Continue carvedilol, Aldactone, Entresto and ivabradine. -follows with [facility] cardiology  PPD placed 11/27.  Read 11/29.  No reaction noted.  Family communication : CSW has been in touch with step-dter --vicki Consults :Psych Discharge Disposition :TBD pt has a challenging discharge planning pending guardianship, she NOT competent to take care of herself. CODE STATUS: FULL DVT Prophylaxis :SCD  TOTAL TIME TAKING CARE OF THIS PATIENT: *20* minutes.  >50% time spent on counselling and coordination of care   Note: This dictation was prepared with Dragon dictation along with smaller phrase technology. Any transcriptional errors that result from this process are unintentional.  Kristy [name] M.D on 09/08/2019 at 1:46 PM  Between 7am to 6pm - Pager - [phone]  After 6pm go to [email or web address]  [facility]   CC: Primary care physician; Owens Loffler, MDPatient ID: Kristy [name], female   DOB: [birth year], 68 y.o.   MRN: [id number]

## 2019-09-09 LAB — GLUCOSE, CAPILLARY
Glucose-Capillary: 151 mg/dL — ABNORMAL HIGH (ref 70–99)
Glucose-Capillary: 394 mg/dL — ABNORMAL HIGH (ref 70–99)
Glucose-Capillary: 206 mg/dL — ABNORMAL HIGH (ref 70–99)
Glucose-Capillary: 96 mg/dL (ref 70–99)

## 2019-09-09 NOTE — Progress Notes (Signed)
Inpatient Diabetes Program Recommendations  AACE/ADA: New Consensus Statement on Inpatient Glycemic Control (2015)  Target Ranges:  Prepandial:   less than 140 mg/dL      Peak postprandial:   less than 180 mg/dL (1-2 hours)      Critically ill patients:  140 - 180 mg/dL   Results for Kristy [name], Kristy [name] (MRN [id number]) as of 09/09/2019 14:07  Ref. Range 09/07/2019 07:52 09/07/2019 11:56 09/07/2019 16:29 09/07/2019 22:34  Glucose-Capillary Latest Ref Range: 70 - 99 mg/dL 319 (H)  60 units U500 Insulin +   22 units LANTUS 483 (H)  100 units U500 Insulin  168 (H) 101 (H)   Results for [name], [name] (MRN [id number]) as of 09/09/2019 14:07  Ref. Range 09/08/2019 07:44 09/08/2019 11:44 09/08/2019 16:30 09/08/2019 21:05  Glucose-Capillary Latest Ref Range: 70 - 99 mg/dL 169 (H)  22 units LANTUS 324 (H)  60 units U500 Insulin 237 (H)  30 units U500 Insulin 122 (H)  25 units LANTUS   Results for [name], [name] (MRN [id number]) as of 09/09/2019 14:07  Ref. Range 09/09/2019 07:58 09/09/2019 12:41  Glucose-Capillary Latest Ref Range: 70 - 99 mg/dL 151 (H)  25 units LANTUS 394 (H)  60 units U500 Insulin    Home DM Meds:Concentrated U500 Insulin QID (before meals and at bedtime) <200- 0 units 201-300- 20 units 301-400- 50 units 401-500- 90 units >500- 100 units  Current Orders:Concentrated U500 Insulin 20-100 units TID with meals according to the following scale: 201-300-30 units 301-400-60 units 401-500-100 units >500- 110 units Lantus 25 units BID     MD- Note patient with very labile CBGs.  It looks as if CBGs have been more stable after the introduction of the Lantus insulin.  I question if patient is actually absorbing such large doses of the Concentrated U500 Insulin??    Note  when patient does not get any U500 Insulin in the AM she has significantly elevated CBGs at 12pm.  Perhaps we could try adjusting her U500 Insulin scale to allow her to get some U500 Insulin in the AM to try to prevent the 12pm elevations??  Recommend adding a scale for CBGs less than 200: Add the following to pt's current U500 Insulin scale: CBG 150-200- give 20 units      --Will follow patient during hospitalization--  Wyn Quaker RN, MSN, CDE Diabetes Coordinator Inpatient Glycemic Control Team Team Pager: [phone] (8a-5p)

## 2019-09-09 NOTE — Care Management Important Message (Signed)
Important Message  Patient Details  Name: Kristy [name] MRN: [id number] Date of Birth: 08-23-1951   Medicare Important Message Given:  Yes  Jocelyn Lamer returned call.  Aware of right.  Copy of Medicare IM sent securely to email address provided: [email or web address].   [name] 09/09/2019, 11:24 AM

## 2019-09-09 NOTE — Care Management Important Message (Signed)
Important Message  Patient Details  Name: Kristy [name] MRN: [id number] Date of Birth: May 01, 1951   Medicare Important Message Given:  Other (see comment)  Left message with Justice Aguirre at [phone] to review Medicare IM.  Awaiting callback.   [name] 09/09/2019, 11:10 AM

## 2019-09-09 NOTE — TOC Progression Note (Signed)
Transition of Care [facility]) - Progression Note    Patient Details  Name: Kristy [name] MRN: [id number] Date of Birth: 25-Jan-1951  [facility] of [facility]) CM/SW Contact  Beverly Sessions, RN Phone Number: 09/09/2019, 4:22 PM  Clinical Narrative:     RNCM followed up with [facility] director.  He requested RNCM reach out to Adult Care supervisor Annita Brod.    RNCM left VM awaiting return call  Expected Discharge Plan: Memory Care Barriers to Discharge: Insurance Authorization, Continued Medical Work up  Expected Discharge Plan and Services Expected Discharge Plan: Memory Care     Post Acute Care Choice: [place] Living arrangements for the past 2 months: Single Family Home                                       Social Determinants of Health (SDOH) Interventions    Readmission Risk Interventions Readmission Risk Prevention Plan 04/28/2019 04/27/2019  Post Dischage Appt - Complete  Medication Screening - Complete  Transportation Screening - Complete  PCP or Specialist Appt within 5-7 Days Complete -  Home Care Screening Complete -  Medication Review (RN CM) Complete -  Some recent data might be hidden

## 2019-09-09 NOTE — Progress Notes (Signed)
[place] at [place] NAME: Kristy [name]    MR#:  [id number]  DATE OF BIRTH:  May 16, 1951  SUBJECTIVE:   Pt is  upset about her discharge plan. Sugars are stable overall  REVIEW OF SYSTEMS:   Review of Systems  Constitutional: Negative for chills, fever and weight loss.  HENT: Negative for ear discharge, ear pain and nosebleeds.   Eyes: Negative for blurred vision, pain and discharge.  Respiratory: Negative for sputum production, shortness of breath, wheezing and stridor.   Cardiovascular: Negative for chest pain, palpitations, orthopnea and PND.  Gastrointestinal: Negative for abdominal pain, diarrhea, nausea and vomiting.  Genitourinary: Negative for frequency and urgency.  Musculoskeletal: Negative for back pain and joint pain.  Neurological: Negative for sensory change, speech change, focal weakness and weakness.  Psychiatric/Behavioral: Negative for depression and hallucinations. The patient is nervous/anxious.    Tolerating Diet: yes Tolerating PT: ambulatory  DRUG ALLERGIES:   Allergies  Allergen Reactions  . Fish Oil Other (See Comments)    Gout  . Glimepiride Other (See Comments)    REACTION: hypoglycemia  . Guanfacine Hcl Other (See Comments)    REACTION: unspecified  . Rosiglitazone Other (See Comments)    CHF    VITALS:  Blood pressure 127/60, pulse 71, temperature 98.4 F (36.9 C), temperature source Oral, resp. rate 20, height 5' 4"  (1.626 m), weight 65 kg, SpO2 97 %.  PHYSICAL EXAMINATION:   Physical Exam  GENERAL:  68 y.o.-year-old patient lying in the bed with no acute distress.  EYES: Pupils equal, round, reactive to light and accommodation. No scleral icterus. Extraocular muscles intact.  HEENT: Head atraumatic, normocephalic. Oropharynx and nasopharynx clear.  NECK:  Supple, no jugular venous distention. No thyroid enlargement, no tenderness.  LUNGS: Normal breath sounds bilaterally, no wheezing,  rales, rhonchi. No use of accessory muscles of respiration.  CARDIOVASCULAR: S1, S2 normal. No murmurs, rubs, or gallops.  ABDOMEN: Soft, nontender, nondistended. Bowel sounds present. No organomegaly or mass.  EXTREMITIES: No cyanosis, clubbing or edema b/l.    NEUROLOGIC: Cranial nerves II through XII are intact. No focal Motor or sensory deficits b/l.   PSYCHIATRIC:  patient is alert and awake SKIN: No obvious rash, lesion, or ulcer.   LABORATORY PANEL:  CBC No results for input(s): WBC, HGB, HCT, PLT in the last 168 hours.  Chemistries  No results for input(s): NA, K, CL, CO2, GLUCOSE, BUN, CREATININE, CALCIUM, MG, AST, ALT, ALKPHOS, BILITOT in the last 168 hours.  Invalid input(s): GFRCGP Cardiac Enzymes No results for input(s): TROPONINI in the last 168 hours. RADIOLOGY:  No results found. ASSESSMENT AND PLAN:   68 year old woman PMH includes systolic CHF, Crohn's disease, chronic kidney disease, diabetes mellitus type 2 presented with altered mental status, hyperglycemia.  Glucose 1434 in ED.  Recently discharged 11/11 with DKA.  Reportedly noncompliant with medications.  Was admitted for acute encephalopathy secondary to hyperglycemic hyperosmolar state.  Diabetes mellitus type 2, uncontrolled, with CKD stage III -hemoglobin A1c 10.9 - We will continue present regimen including Lantus and U5 100.  -Lantus increased to 25 units bid  -pt has a very LABILE diabetes--overall most of the time stable  Anemia with drop in hemoglobin.   -Stable hgb 11.1  -GI recommended no further evaluation suggested. -Continue aspirin and Plavix -continue PPI  Acute metabolic encephalopathy, cognitive decline, depression, lack of capacity.   -Per stepdaughter [name] patient often skips medication with non compliance -Per 2  Psychiatrist Drs  Paliy and Cristofano, patient not able to understand the details of her insulin regimen nor administered on a regular basis. Will need help on a daily  basis to assist with medication delivery and adherence.  -Seen in follow-up by psychiatry on 11/24 and 11/26, impression was neurocognitive disorder and unspecified anxiety disorder.  Patient does not appear to understand severity of medical condition and is still lacking in capacity to make medical decisions and unable to take care of herself. -please review CSW notesregarding pt's discharge planning--COMPLEX d/c planning  Chronic systolic CHF, LVEF 54-98%. -Continue carvedilol, Aldactone, Entresto and ivabradine. -follows with [facility] cardiology  PPD placed 11/27.  Read 11/29.  No reaction noted.  Family communication : CSW has been in touch with step-dter --vicki, APS/DSS is involved also Consults :Psych Discharge Disposition :TBD pt has a challenging discharge planning pending guardianship, she NOT competent to take care of herself. CODE STATUS: FULL DVT Prophylaxis :SCD  TOTAL TIME TAKING CARE OF THIS PATIENT: *20* minutes.  >50% time spent on counselling and coordination of care   Note: This dictation was prepared with Dragon dictation along with smaller phrase technology. Any transcriptional errors that result from this process are unintentional.  [name] M.D on 09/09/2019 at 12:08 PM  Between 7am to 6pm - Pager - [phone]  After 6pm go to [email or web address]  [facility]   CC: Primary care physician; Owens Loffler, MDPatient ID: Kristy [name], female   DOB: 1951-08-03, 68 y.o.   MRN: [id number]

## 2019-09-10 LAB — GLUCOSE, CAPILLARY
Glucose-Capillary: 245 mg/dL — ABNORMAL HIGH (ref 70–99)
Glucose-Capillary: 324 mg/dL — ABNORMAL HIGH (ref 70–99)
Glucose-Capillary: 148 mg/dL — ABNORMAL HIGH (ref 70–99)
Glucose-Capillary: 168 mg/dL — ABNORMAL HIGH (ref 70–99)

## 2019-09-10 NOTE — Progress Notes (Signed)
[place] at [place] NAME: Kristy [name]    MR#:  [id number]  DATE OF BIRTH:  [birth year]  SUBJECTIVE:   Patient is frustrated with being in the hospital.  She knows that she is in the hospital she notes today, but when speaking in conversation, she does become tangential and needs redirection  REVIEW OF SYSTEMS:   Tolerating Diet: yes Tolerating PT: ambulatory  DRUG ALLERGIES:   Allergies  Allergen Reactions  . Fish Oil Other (See Comments)    Gout  . Glimepiride Other (See Comments)    REACTION: hypoglycemia  . Guanfacine Hcl Other (See Comments)    REACTION: unspecified  . Rosiglitazone Other (See Comments)    CHF    VITALS:  Blood pressure (!) 101/54, pulse 73, temperature 98.2 F (36.8 C), temperature source Oral, resp. rate 18, height 5' 4"  (1.626 m), weight 65 kg, SpO2 99 %.  PHYSICAL EXAMINATION:   Physical Exam  General exam: Alert, awake, oriented x 3 Respiratory system: Clear to auscultation. Respiratory effort normal. Cardiovascular system:RRR. No murmurs, rubs, gallops. Gastrointestinal system: Abdomen is nondistended, soft and nontender. No organomegaly or masses felt. Normal bowel sounds heard. Central nervous system: Alert and oriented. No focal neurological deficits. Extremities: No C/C/E, +pedal pulses Skin: No rashes, lesions or ulcers Psychiatry: Confused at times.   LABORATORY PANEL:  CBC No results for input(s): WBC, HGB, HCT, PLT in the last 168 hours.  Chemistries  No results for input(s): NA, K, CL, CO2, GLUCOSE, BUN, CREATININE, CALCIUM, MG, AST, ALT, ALKPHOS, BILITOT in the last 168 hours.  Invalid input(s): GFRCGP Cardiac Enzymes No results for input(s): TROPONINI in the last 168 hours. RADIOLOGY:  No results found. ASSESSMENT AND PLAN:   68 year old woman PMH includes systolic CHF, Crohn's disease, chronic kidney disease, diabetes mellitus type 2 presented with altered mental status,  hyperglycemia.  Glucose 1434 in ED.  Recently discharged 11/11 with DKA.  Reportedly noncompliant with medications.  Was admitted for acute encephalopathy secondary to hyperglycemic hyperosmolar state.  Diabetes mellitus type 2, uncontrolled, with CKD stage III -hemoglobin A1c 10.9 - We will continue present regimen including Lantus and U5 100.  -Lantus increased to 25 units bid  -pt has a very LABILE diabetes--overall most of the time stable  Anemia with drop in hemoglobin.   -Stable hgb 11.1  -GI recommended no further evaluation suggested. -Continue aspirin and Plavix -continue PPI  Acute metabolic encephalopathy, cognitive decline, depression, lack of capacity.   -Per stepdaughter [name] patient often skips medication with non compliance -Per 2  Psychiatrist Drs Danella Sensing and Cristofano, patient not able to understand the details of her insulin regimen nor administered on a regular basis. Will need help on a daily basis to assist with medication delivery and adherence.  -Seen in follow-up by psychiatry on 11/24 and 11/26, impression was neurocognitive disorder and unspecified anxiety disorder.  Patient does not appear to understand severity of medical condition and is still lacking in capacity to make medical decisions and unable to take care of herself. -please review CSW notesregarding pt's discharge planning--COMPLEX d/c planning  Chronic systolic CHF, LVEF 32-95%. -Continue carvedilol, Aldactone, Entresto and ivabradine. -follows with [facility] cardiology  PPD placed 11/27.  Read 11/29.  No reaction noted.  Family communication : CSW has been in touch with step-dter --vicki, APS/DSS is involved also Consults :Psych Discharge Disposition :TBD pt has a challenging discharge planning pending guardianship, she NOT competent to take care of herself. CODE STATUS:  FULL DVT Prophylaxis :SCD  TOTAL TIME TAKING CARE OF THIS PATIENT: *20* minutes.  >50% time spent on counselling and  coordination of care   [name] M.D on 09/10/2019 at 7:50 PM   After 6pm go to [email or web address]  [facility]   CC: Primary care physician; Owens Loffler, MDPatient ID: Kristy [name], female   DOB: [birth year], 68 y.o.   MRN: [id number]

## 2019-09-10 NOTE — TOC Progression Note (Signed)
Transition of Care [facility]) - Progression Note    Patient Details  Name: CIRIA BERNARDINI MRN: [id number] Date of Birth: 1951-02-15  [facility] CM/SW Contact  Beverly Sessions, RN Phone Number: 09/10/2019, 4:09 PM  Clinical Narrative:    Message left for adult Services/Guardianship Supervisor: Elaina Hoops    Expected Discharge Plan: Memory Care Barriers to Discharge: Insurance Authorization, Continued Medical Work up  Expected Discharge Plan and Services Expected Discharge Plan: [place] Acute Care Choice: [place] Living arrangements for the past 2 months: Single Family Home                                       Social Determinants of Health (SDOH) Interventions    Readmission Risk Interventions Readmission Risk Prevention Plan 04/28/2019 04/27/2019  Post Dischage Appt - Complete  Medication Screening - Complete  Transportation Screening - Complete  PCP or Specialist Appt within 5-7 Days Complete -  Home Care Screening Complete -  Medication Review (RN CM) Complete -  Some recent data might be hidden

## 2019-09-11 LAB — GLUCOSE, CAPILLARY
Glucose-Capillary: 251 mg/dL — ABNORMAL HIGH (ref 70–99)
Glucose-Capillary: 373 mg/dL — ABNORMAL HIGH (ref 70–99)
Glucose-Capillary: 380 mg/dL — ABNORMAL HIGH (ref 70–99)
Glucose-Capillary: 194 mg/dL — ABNORMAL HIGH (ref 70–99)

## 2019-09-11 MED ORDER — INSULIN GLARGINE 100 UNIT/ML ~~LOC~~ SOLN
27.0000 [IU] | Freq: Two times a day (BID) | SUBCUTANEOUS | Status: DC
  Administered 2019-09-11 – 2019-09-17 (×11): 27 [IU] via SUBCUTANEOUS
  Filled 2019-09-11 (×14): qty 0.27
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
## 2019-09-11 NOTE — Progress Notes (Signed)
Inpatient Diabetes Program Recommendations  AACE/ADA: New Consensus Statement on Inpatient Glycemic Control (2015)  Target Ranges:  Prepandial:   less than 140 mg/dL      Peak postprandial:   less than 180 mg/dL (1-2 hours)      Critically ill patients:  140 - 180 mg/dL   Lab Results  Component Value Date   GLUCAP 251 (H) 09/11/2019   HGBA1C 10.9 (H) 07/08/2019    Review of Glycemic Control Results for YNEZ, Kristy [name] (MRN [id number]) as of 09/11/2019 08:54  Ref. Range 09/10/2019 08:16 09/10/2019 11:42 09/10/2019 16:56 09/10/2019 21:29 09/11/2019 07:51  Glucose-Capillary Latest Ref Range: 70 - 99 mg/dL 245 (H) 324 (H) 148 (H) 168 (H) 251 (H)   Recommend adding a scale for CBGs less than 200: Add the following to pt's current U500 Insulin scale: CBG 150-200- give 20 units   Secure chat sent to Dr. Roderic Palau with recommendations.  Thank you, Nani Gasser. Mathieu Schloemer, RN, MSN, CDE  Diabetes Coordinator Inpatient Glycemic Control Team Team Pager [phone] (8am-5pm) 09/11/2019 8:55 AM

## 2019-09-11 NOTE — Progress Notes (Addendum)
Phone messages left with [place] DSS APS supervisors Elaina Hoops and Kizzie Bane regarding assistance with guardianship issue related to pt finances. Winferd Humphrey, MSW, LCSW Advanced Care Supervisor 09/11/2019 1:15 PM   Supervisor spoke to [place], who will contact previous APS worker, Magda Paganini, as their was previously a POA in place for help managing the finances.  Supervisor spoke to [place] worker, Milus Glazier, [phone].  POA is no longer in place as pt revoked it. Informed her of psychiatric eval on 11/26 stating pt lacks capacity.  Magda Paganini will talk with supervisor about potentially applying for emergency guardianship order.  Pt step daughter did apply for emergency guardianship at the time that the 1/12 guardianship hearing was scheduled, but she was turned down. Winferd Humphrey, MSW, LCSW Advanced Care Supervisor 09/11/2019 2:34 PM

## 2019-09-11 NOTE — Progress Notes (Signed)
[place] at [place] NAME: Kristy [name]    MR#:  [id number]  DATE OF BIRTH:  [birth year]  SUBJECTIVE:   Patient wants to go home. She repeatedly directs conversation towards her stepdaughter. She does not have any new complaints  REVIEW OF SYSTEMS:   Tolerating Diet: yes Tolerating PT: ambulatory  DRUG ALLERGIES:   Allergies  Allergen Reactions  . Fish Oil Other (See Comments)    Gout  . Glimepiride Other (See Comments)    REACTION: hypoglycemia  . Guanfacine Hcl Other (See Comments)    REACTION: unspecified  . Rosiglitazone Other (See Comments)    CHF    VITALS:  Blood pressure 120/67, pulse 74, temperature 97.6 F (36.4 C), temperature source Oral, resp. rate 17, height 5' 4"  (1.626 m), weight 65 kg, SpO2 100 %.  PHYSICAL EXAMINATION:   Physical Exam  General exam: Alert, awake, oriented x 3 Respiratory system: Clear to auscultation. Respiratory effort normal. Cardiovascular system:RRR. No murmurs, rubs, gallops. Gastrointestinal system: Abdomen is nondistended, soft and nontender. No organomegaly or masses felt. Normal bowel sounds heard. Central nervous system: Alert and oriented. No focal neurological deficits. Extremities: No C/C/E, +pedal pulses Skin: No rashes, lesions or ulcers Psychiatry: pleasant, repeatedly focuses conversation on her stepdaughter    LABORATORY PANEL:  CBC No results for input(s): WBC, HGB, HCT, PLT in the last 168 hours.  Chemistries  No results for input(s): NA, K, CL, CO2, GLUCOSE, BUN, CREATININE, CALCIUM, MG, AST, ALT, ALKPHOS, BILITOT in the last 168 hours.  Invalid input(s): GFRCGP Cardiac Enzymes No results for input(s): TROPONINI in the last 168 hours. RADIOLOGY:  No results found. ASSESSMENT AND PLAN:   68 year old woman PMH includes systolic CHF, Crohn's disease, chronic kidney disease, diabetes mellitus type 2 presented with altered mental status, hyperglycemia.  Glucose  1434 in ED.  Recently discharged 11/11 with DKA.  Reportedly noncompliant with medications.  Was admitted for acute encephalopathy secondary to hyperglycemic hyperosmolar state.  Diabetes mellitus type 2, uncontrolled, with CKD stage III -hemoglobin A1c 10.9 - We will continue present regimen including Lantus and U5 100.  -Lantus increased to 27 units bid  -pt has a very LABILE diabetes--overall most of the time stable  Anemia with drop in hemoglobin.   -Stable hgb 11.1  -GI recommended no further evaluation suggested. -Continue aspirin and Plavix -continue PPI  Acute metabolic encephalopathy, cognitive decline, depression, lack of capacity.   -Per stepdaughter Kristy [name] patient often skips medication with non compliance -Per 2  Psychiatrist Drs Danella Sensing and Cristofano, patient not able to understand the details of her insulin regimen nor administered on a regular basis. Will need help on a daily basis to assist with medication delivery and adherence.  -Seen in follow-up by psychiatry on 11/24 and 11/26, impression was neurocognitive disorder and unspecified anxiety disorder.  Patient does not appear to understand severity of medical condition and is still lacking in capacity to make medical decisions and unable to take care of herself. -please review CSW notesregarding pt's discharge planning--COMPLEX d/c planning  Chronic systolic CHF, LVEF 08-14%. -Continue carvedilol, Aldactone, Entresto and ivabradine. -follows with [facility] cardiology  PPD placed 11/27.  Read 11/29.  No reaction noted.  Family communication : CSW has been in touch with step-dter --vicki, APS/DSS is involved also Consults :Psych Discharge Disposition :TBD pt has a challenging discharge planning pending guardianship, she NOT competent to take care of herself. CODE STATUS: FULL DVT Prophylaxis :SCD  TOTAL TIME TAKING CARE  OF THIS PATIENT: *20* minutes.  >50% time spent on counselling and coordination of  care   Kristy [name] M.D on 09/11/2019 at 3:21 PM   After 6pm go to [email or web address]  [facility]   CC: Primary care physician; Owens Loffler, MDPatient ID: Kristy [name], female   DOB: 02-06-1951, 68 y.o.   MRN: [id number]

## 2019-09-12 LAB — GLUCOSE, CAPILLARY
Glucose-Capillary: 54 mg/dL — ABNORMAL LOW (ref 70–99)
Glucose-Capillary: 174 mg/dL — ABNORMAL HIGH (ref 70–99)
Glucose-Capillary: 169 mg/dL — ABNORMAL HIGH (ref 70–99)
Glucose-Capillary: 381 mg/dL — ABNORMAL HIGH (ref 70–99)
Glucose-Capillary: 348 mg/dL — ABNORMAL HIGH (ref 70–99)
Glucose-Capillary: 262 mg/dL — ABNORMAL HIGH (ref 70–99)
Glucose-Capillary: 147 mg/dL — ABNORMAL HIGH (ref 70–99)

## 2019-09-12 NOTE — TOC Progression Note (Signed)
Transition of Care [facility]) - Progression Note    Patient Details  Name: Kristy [name] MRN: [id number] Date of Birth: August 09, 1951  Transition of Care [facility]) CM/SW Contact  Beverly Sessions, RN Phone Number: 09/12/2019, 5:52 PM  Clinical Narrative:     [facility] team was notified on 12/10 that additional POA paper work was provided by stepdaughter  It was determined that after niece relinquished her rights as POA, it went to the 2nd in line which is the patient's brother [employer] manager.  [name] informed the [facility] team that she had reached out to [facility] to notify him  RNCM spoke with Sherren Mocha today.  He is aware that he is now the financial POA for patient and is in agreement  RNCM emailed Todd statement on [facility] letter head signed by 2 MDS stating that the patient "is incapacitated at this time and is unable to manage her personal business matters. " for him to provide to the bank.  Sherren Mocha states that he will have to get with "vickie" in order to determine which financial institution the patient banks.   RNCM left message for Tiffany and Barnetta Chapel at [place] in order to obtain copy of contract that will have to be signed by POA, and to obtain estimated monthly cost to stay at the facility.  Awaiting return call.    Hard copy of POA paperwork, and letter signed by 2 MDs states the patient is incapacitated put in patient's chart.   [name] patient's stepdaughter called and updated   Expected Discharge Plan: Memory Care Barriers to Discharge: Insurance Authorization, Continued Medical Work up  Expected Discharge Plan and Services Expected Discharge Plan: Memory Care     Post Acute Care Choice: [place] Living arrangements for the past 2 months: Single Family Home                                       Social Determinants of Health (SDOH) Interventions    Readmission Risk Interventions Readmission Risk Prevention Plan 04/28/2019 04/27/2019  Post Dischage Appt - Complete   Medication Screening - Complete  Transportation Screening - Complete  PCP or Specialist Appt within 5-7 Days Complete -  Home Care Screening Complete -  Medication Review (RN CM) Complete -  Some recent data might be hidden

## 2019-09-12 NOTE — Progress Notes (Signed)
Inpatient Diabetes Program Recommendations  AACE/ADA: New Consensus Statement on Inpatient Glycemic Control (2015)  Target Ranges:  Prepandial:   less than 140 mg/dL      Peak postprandial:   less than 180 mg/dL (1-2 hours)      Critically ill patients:  140 - 180 mg/dL   Results for BLU, MCGLAUN (MRN [id number]) as of 09/12/2019 13:16  Ref. Range 09/11/2019 07:51 09/11/2019 12:00 09/11/2019 16:41 09/11/2019 21:34 09/12/2019 01:12  Glucose-Capillary Latest Ref Range: 70 - 99 mg/dL 251 (H)  U500 Insulin HELD  LANTUS 25 units given at 9:03am 373 (H)  60 units U500 Insulin 380 (H)  60 units U500 Insulin 194 (H)    27 units LANTUS 54 (L)   Results for YULIETH, CARRENDER (MRN [id number]) as of 09/12/2019 13:16  Ref. Range 09/12/2019 08:17 09/12/2019 12:35  Glucose-Capillary Latest Ref Range: 70 - 99 mg/dL 169 (H)  27 units LANTUS 381 (H)    Home DM Meds:Concentrated U500 Insulin QID (before meals and at bedtime) <200- 0 units 201-300- 20 units 301-400- 50 units 401-500- 90 units >500- 100 units  Current Orders:Concentrated U500 Insulin 20-100 units TID with meals according to the following scale: 201-300-30 units 301-400-60 units 401-500-100 units >500- 110 units Lantus 27units BID     MD- Note patient with very labile CBGs.  It looks as if CBGs have been more stable after the introduction of the Lantus insulin.  I question if patient is actually absorbing such large doses of the Concentrated U500 Insulin??    Note when patient does not get any U500 Insulin in the AM she has significantly elevated CBGs at 12pm.  Perhaps we could try adjusting her U500 Insulin scale to allow her to get some U500 Insulin in the AM to try to prevent the 12pm  elevations??  Recommend adding a scale for CBGs less than 200: Add the following to pt's current U500 Insulin scale: CBG 150-200- give 20 units     --Will follow patient during hospitalization--  Wyn Quaker RN, MSN, CDE Diabetes Coordinator Inpatient Glycemic Control Team Team Pager: [phone] (8a-5p)

## 2019-09-12 NOTE — Progress Notes (Signed)
Kristy [name] at [place] NAME: Kristy [name]    MR#:  [id number]  DATE OF BIRTH:  [birth year]  SUBJECTIVE:   Says she wants to go home.  She has discussed this with her brother who she said will come tomorrow to pick her up.  Denies any other complaints  REVIEW OF SYSTEMS:   Tolerating Diet: yes Tolerating PT: ambulatory  DRUG ALLERGIES:   Allergies  Allergen Reactions  . Fish Oil Other (See Comments)    Gout  . Glimepiride Other (See Comments)    REACTION: hypoglycemia  . Guanfacine Hcl Other (See Comments)    REACTION: unspecified  . Rosiglitazone Other (See Comments)    CHF    VITALS:  Blood pressure 107/65, pulse 75, temperature 98 F (36.7 C), temperature source Oral, resp. rate 17, height 5' 4"  (1.626 m), weight 65 kg, SpO2 96 %.  PHYSICAL EXAMINATION:   Physical Exam  General exam: Alert, awake, no distress Respiratory system: Clear to auscultation. Respiratory effort normal. Cardiovascular system:RRR. No murmurs, rubs, gallops. Gastrointestinal system: Abdomen is nondistended, soft and nontender. No organomegaly or masses felt. Normal bowel sounds heard. Central nervous system: Alert and oriented. No focal neurological deficits. Extremities: No C/C/E, +pedal pulses Skin: No rashes, lesions or ulcers  LABORATORY PANEL:  CBC No results for input(s): WBC, HGB, HCT, PLT in the last 168 hours.  Chemistries  No results for input(s): NA, K, CL, CO2, GLUCOSE, BUN, CREATININE, CALCIUM, MG, AST, ALT, ALKPHOS, BILITOT in the last 168 hours.  Invalid input(s): GFRCGP Cardiac Enzymes No results for input(s): TROPONINI in the last 168 hours. RADIOLOGY:  No results found. ASSESSMENT AND PLAN:   68 year old woman PMH includes systolic CHF, Crohn's disease, chronic kidney disease, diabetes mellitus type 2 presented with altered mental status, hyperglycemia.  Glucose 1434 in ED.  Recently discharged 11/11 with DKA.  Reportedly  noncompliant with medications.  Was admitted for acute encephalopathy secondary to hyperglycemic hyperosmolar state.  Diabetes mellitus type 2, uncontrolled, with CKD stage III -hemoglobin A1c 10.9 - We will continue present regimen including Lantus and U5 100.  -Lantus increased to 27 units bid  -pt has a very LABILE diabetes--overall most of the time stable  Anemia with drop in hemoglobin.   -Stable hgb 11.1  -GI recommended no further evaluation suggested. -Continue aspirin and Plavix -continue PPI  Acute metabolic encephalopathy, cognitive decline, depression, lack of capacity.   -Per stepdaughter Kristy [name] patient often skips medication with non compliance -Per 2  Psychiatrist Drs Danella Sensing and Cristofano, patient not able to understand the details of her insulin regimen nor administered on a regular basis. Will need help on a daily basis to assist with medication delivery and adherence.  -Seen in follow-up by psychiatry on 11/24 and 11/26, impression was neurocognitive disorder and unspecified anxiety disorder.  Patient does not appear to understand severity of medical condition and is still lacking in capacity to make medical decisions and unable to take care of herself. -please review CSW notesregarding pt's discharge planning--COMPLEX d/c planning  Chronic systolic CHF, LVEF 79-02%. -Continue carvedilol, Aldactone, Entresto and ivabradine. -[facility] cardiology  PPD placed 11/27.  Read 11/29.  No reaction noted.  Family communication : CSW has been in touch with step-dter --vicki, brother Kristy [name], APS/DSS is involved also Consults :Psych Discharge Disposition :TBD pt has a challenging discharge planning pending guardianship, she NOT competent to take care of herself. CODE STATUS: FULL DVT Prophylaxis :SCD  TOTAL TIME TAKING  CARE OF THIS PATIENT: *20* minutes.  >50% time spent on counselling and coordination of care   [name] M.D on 09/12/2019 at 8:07  PM   After 6pm go to [email or web address]  [facility]   CC: Primary care physician; Owens Loffler, MDPatient ID: Kristy [name], female   DOB: [birth year], 68 y.o.   MRN: [id number]

## 2019-09-13 DIAGNOSIS — E1165 Type 2 diabetes mellitus with hyperglycemia: Secondary | ICD-10-CM

## 2019-09-13 DIAGNOSIS — Z794 Long term (current) use of insulin: Secondary | ICD-10-CM

## 2019-09-13 DIAGNOSIS — F3342 Major depressive disorder, recurrent, in full remission: Secondary | ICD-10-CM

## 2019-09-13 DIAGNOSIS — F4325 Adjustment disorder with mixed disturbance of emotions and conduct: Secondary | ICD-10-CM

## 2019-09-13 DIAGNOSIS — E1122 Type 2 diabetes mellitus with diabetic chronic kidney disease: Secondary | ICD-10-CM

## 2019-09-13 DIAGNOSIS — N183 Chronic kidney disease, stage 3 unspecified: Secondary | ICD-10-CM

## 2019-09-13 LAB — GLUCOSE, CAPILLARY
Glucose-Capillary: 64 mg/dL — ABNORMAL LOW (ref 70–99)
Glucose-Capillary: 95 mg/dL (ref 70–99)
Glucose-Capillary: 108 mg/dL — ABNORMAL HIGH (ref 70–99)
Glucose-Capillary: 343 mg/dL — ABNORMAL HIGH (ref 70–99)
Glucose-Capillary: 500 mg/dL — ABNORMAL HIGH (ref 70–99)
Glucose-Capillary: 196 mg/dL — ABNORMAL HIGH (ref 70–99)

## 2019-09-13 MED ORDER — LORAZEPAM 2 MG/ML IJ SOLN
1.0000 mg | Freq: Once | INTRAMUSCULAR | Status: DC
  Filled 2019-09-13: qty 1

## 2019-09-13 NOTE — Consult Note (Signed)
[facility] Consult   Reason for Consult: suicidal ideations  Referring Physician:   Patient Identification: Kristy [name] MRN:  [id number] Principal Diagnosis: Uncontrolled type 2 diabetes mellitus with stage 3 chronic kidney disease, with long-term current use of insulin ([place]) Diagnosis:  Principal Problem:   Uncontrolled type 2 diabetes mellitus with stage 3 chronic kidney disease, with long-term current use of insulin (HCC) Active Problems:   Major depressive disorder, recurrent, in full remission ([place])   Adjustment disorder with mixed disturbance of emotions and conduct   Essential hypertension   LBBB (left bundle branch block)   Coronary artery disease, non-occlusive   Chronic systolic CHF (congestive heart failure) (HCC)   NICM (nonischemic cardiomyopathy) (HCC)   Elevated troponin   Sepsis ([place])   Acute metabolic encephalopathy   Thrombocytopenia ([place])  Total Time spent with patient: 35 minutes  Subjective:   Kristy [name] is a 68 y.o. female patient admitted with altered mental status, consult placed for suicidal ideations.  "I am being held prisoner because of my diabetes."  Patient seen and evaluated in person by this provider.  She reports that she was told by her lawyer and her payee that she could leave the hospital anytime.  Based on this information, the patient left AMA earlier today.  She had called her neighbor to come pick her up and was sitting in the car with security around her as her medical doctor did not feel that she was stable to discharge at this time.  Patient reports she feels like she is being held at the hospital against her will because her blood sugars are not being controlled which she feels [facility] was not able to and neither will this hospital.  She understands that she is under IVC by her MD based on medical concerns and cognition issues.  Evidently earlier, she stated that she would rather kill herself and going to a nursing home  and be a prisoner.  On evaluation, She states to this provider and the sitter in her room that she is not suicidal but upset about the situation.  Prior to assessment she was sitting in her chair talking to her sitter at the bedside.  Denies any intent or plan to hurt herself.  No homicidal thoughts or hallucinations or substance abuse.  Psychiatrically cleared at this point.  HPI:  As per Psych Consult note by Dr.Cristofano from 08/26/2019. Patient seen.  Chart reviewed. Patient discussed with nursing; no overnight events reported.  SUBJECTIVE: Patient seen in her room. She was watching TV. Patient is calm, cooperative and hypertalklative. She reports feeling "okay, but I wish I would be at home for Thanksgiving". She reports feeling home-sick. She denies feeling depressed. She reports "some" anxiety related to unclear discharge plan. She tells that she came to the hospital because her "stepdaughter sent me". She is fixated on relationships with step-daughter who, patient thinks, overly-controls patient`s life. Patient knows that she was admitted with high blood glucose level, but appears do not understand the sevirity of the problem and says she thinks the admission was not necessary. She says she can continue live alone and manage her medications. She is upset due to the possibility of discharge to skilled nursing facility. She denies thoughts of harming self or others. Denies any hallucinations. Reports good sleep and appetite. Denies current physical complaints, except of forearm soreness in a place of IV infusion.     Patient evaluated by OT yesterday, per OT Note: "Currently pt demonstrates impairments  in cognition and mild balance deficits requiring supervision for safety.Pt near baseline functional status, but continues to be limited by cognitive deficits.Upon hospital discharge, recommend pt discharge with 24/7 supervision/asisst. Would strongly recommend considering ALF for longer term solution  to minimize risk of readmission. "  Past Psychiatric History: see initial Psych Consult note from 11/24  Risk to Self:  No Risk to Others:  No Prior Inpatient Therapy:  No Prior Outpatient Therapy:  No  Past Medical History:  Past Medical History:  Diagnosis Date  . Allergic rhinitis   . Allergy   . Cataract    mild   . Crohn's disease ([place]) 10/13/2013  . Diverticulosis   . GERD (gastroesophageal reflux disease)   . Gout   . HFrEF (heart failure with reduced ejection fraction) ([place])    a. 12/2008 Cath: EF 45% w/ inf HK; b. 07/2009 Echo: EF 50-55%; c. 08/2018 Echo: EF 20-25%.  Marland Kitchen Hyperlipidemia   . Hypertension   . IBS (irritable bowel syndrome)   . Left bundle branch block   . Neuromuscular disorder (HCC)    neuropathy  . NICM (nonischemic cardiomyopathy) ([place])    a. 12/2008 Cath: no significant dzs, EF 45% w/ inf HK->Med Rx; b. 07/2009 Echo: EF 50-55%; c. 08/2018 Echo: EF 20-25%, ant/antsept HK, mild MR, mildly dil LA, nl RV fx; d. 08/2018 Cath: D1 80, otw nonobs dzs->Med rx.  . Non-obstructive CAD (coronary artery disease)    a. 12/2008 Cath: no significant dzs, EF 45% w/ inf HK->Med Rx; b. 08/2018 Cath: LM nl, LAD min irregs, D1 80, LCX 24md, RCA min irregs->Med Rx.  . Osteoarthritis   . Poorly controlled Diabetes mellitus    a. 07/2018 A1c 13.1.  .Marland KitchenSymptomatic cholelithiasis   . Uncontrolled type 2 diabetes mellitus with stage 3 chronic kidney disease, with long-term current use of insulin ([place]          Past Surgical History:  Procedure Laterality Date  . BREAST BIOPSY Right 06/24/2019   stereo bx/ x clip/ path pending  . BREAST CYST ASPIRATION    . COLONOSCOPY    . DILATION AND CURETTAGE OF UTERUS    . LEFT HEART CATH AND CORONARY ANGIOGRAPHY N/A 04/24/2019   Procedure: LEFT HEART CATH AND CORONARY ANGIOGRAPHY;  Surgeon: ENelva Bush MD;  [facility];  Service: Cardiovascular;  Laterality: N/A;  . RIGHT/LEFT HEART CATH AND CORONARY ANGIOGRAPHY  N/A 08/26/2018   Procedure: RIGHT/LEFT HEART CATH AND CORONARY ANGIOGRAPHY;  Surgeon: AWellington Hampshire MD;  [facility];  Service: Cardiovascular;  Laterality: N/A;  . SHOULDER SURGERY     15 + yrs ago   . SKIN SURGERY     nose   . VAGINAL HYSTERECTOMY     Family History:  Family History  Problem Relation Age of Onset  . Kidney failure Mother   . Hypertension Father   . Kidney failure Brother   . Colon cancer Neg Hx   . Colon polyps Neg Hx   . Rectal cancer Neg Hx   . Stomach cancer Neg Hx   . Breast cancer Neg Hx    Family Psychiatric  History: see initial Psych Consult Note from 11/24 Social History:  Social History   Substance and Sexual Activity  Alcohol Use No     Social History   Substance and Sexual Activity  Drug Use No    Social History   Socioeconomic History  . Marital status: Widowed    Spouse  name: Not on file  . Number of children: Not on file  . Years of education: Not on file  . Highest education level: Not on file  Occupational History  . Not on file  Tobacco Use  . Smoking status: Former Smoker    Packs/day: 0.75    Years: 30.00    Pack years: 22.50    Types: Cigarettes    Quit date: 06/06/1997    Years since quitting: 22.2  . Smokeless tobacco: Never Used  Substance and Sexual Activity  . Alcohol use: No  . Drug use: No  . Sexual activity: Not on file  Other Topics Concern  . Not on file  Social History Narrative  . Not on file   Social Determinants of Health   Financial Resource Strain:   . Difficulty of Paying Living Expenses: Not on file  Food Insecurity:   . Worried About Charity fundraiser in the Last Year: Not on file  . Ran Out of Food in the Last Year: Not on file  Transportation Needs:   . Lack of Transportation (Medical): Not on file  . Lack of Transportation (Non-Medical): Not on file  Physical Activity:   . Days of Exercise per Week: Not on file  . Minutes of Exercise per Session: Not on file   Stress:   . Feeling of Stress : Not on file  Social Connections:   . Frequency of Communication with Friends and Family: Not on file  . Frequency of Social Gatherings with Friends and Family: Not on file  . Attends Religious Services: Not on file  . Active Member of Clubs or Organizations: Not on file  . Attends Archivist Meetings: Not on file  . Marital Status: Not on file   Additional Social History:    Allergies:   Allergies  Allergen Reactions  . Fish Oil Other (See Comments)    Gout  . Glimepiride Other (See Comments)    REACTION: hypoglycemia  . Guanfacine Hcl Other (See Comments)    REACTION: unspecified  . Rosiglitazone Other (See Comments)    CHF    Labs:  Results for orders placed or performed during the hospital encounter of 08/21/19 (from the past 48 hour(s))  Glucose, capillary     Status: Abnormal   Collection Time: 09/11/19  4:41 PM  Result Value Ref Range   Glucose-Capillary 380 (H) 70 - 99 mg/dL  Glucose, capillary     Status: Abnormal   Collection Time: 09/11/19  9:34 PM  Result Value Ref Range   Glucose-Capillary 194 (H) 70 - 99 mg/dL   Comment 1 Notify RN   Glucose, capillary     Status: Abnormal   Collection Time: 09/12/19  1:12 AM  Result Value Ref Range   Glucose-Capillary 54 (L) 70 - 99 mg/dL   Comment 1 Notify RN   Glucose, capillary     Status: Abnormal   Collection Time: 09/12/19  5:42 AM  Result Value Ref Range   Glucose-Capillary 174 (H) 70 - 99 mg/dL   Comment 1 Notify RN   Glucose, capillary     Status: Abnormal   Collection Time: 09/12/19  8:17 AM  Result Value Ref Range   Glucose-Capillary 169 (H) 70 - 99 mg/dL  Glucose, capillary     Status: Abnormal   Collection Time: 09/12/19 12:35 PM  Result Value Ref Range   Glucose-Capillary 381 (H) 70 - 99 mg/dL  Glucose, capillary     Status: Abnormal  Collection Time: 09/12/19  4:34 PM  Result Value Ref Range   Glucose-Capillary 348 (H) 70 - 99 mg/dL  Glucose, capillary      Status: Abnormal   Collection Time: 09/12/19  7:34 PM  Result Value Ref Range   Glucose-Capillary 262 (H) 70 - 99 mg/dL   Comment 1 Notify RN   Glucose, capillary     Status: Abnormal   Collection Time: 09/12/19 10:44 PM  Result Value Ref Range   Glucose-Capillary 147 (H) 70 - 99 mg/dL  Glucose, capillary     Status: Abnormal   Collection Time: 09/13/19  3:28 AM  Result Value Ref Range   Glucose-Capillary 64 (L) 70 - 99 mg/dL  Glucose, capillary     Status: None   Collection Time: 09/13/19  3:51 AM  Result Value Ref Range   Glucose-Capillary 95 70 - 99 mg/dL  Glucose, capillary     Status: Abnormal   Collection Time: 09/13/19  7:56 AM  Result Value Ref Range   Glucose-Capillary 108 (H) 70 - 99 mg/dL  Glucose, capillary     Status: Abnormal   Collection Time: 09/13/19 11:51 AM  Result Value Ref Range   Glucose-Capillary 343 (H) 70 - 99 mg/dL    Current Facility-Administered Medications  Medication Dose Route Frequency Provider Last Rate Last Admin  . aspirin EC tablet 81 mg  81 mg Oral Daily Samuella Cota, MD   81 mg at 09/13/19 9702  . carvedilol (COREG) tablet 12.5 mg  12.5 mg Oral BID Ivor Costa, MD   12.5 mg at 09/13/19 6378  . clopidogrel (PLAVIX) tablet 75 mg  75 mg Oral Q breakfast Samuella Cota, MD   75 mg at 09/13/19 5885  . dextrose 50 % solution 0-50 mL  0-50 mL Intravenous PRN Ivor Costa, MD   25 mL at 09/02/19 0012  . FLUoxetine (PROZAC) capsule 20 mg  20 mg Oral Daily Ivor Costa, MD   20 mg at 09/13/19 0820  . hydrALAZINE (APRESOLINE) tablet 25 mg  25 mg Oral TID PRN Ivor Costa, MD      . insulin glargine (LANTUS) injection 27 Units  27 Units Subcutaneous BID [name], MD   27 Units at 09/13/19 0277  . insulin regular human CONCENTRATED (HUMULIN R) 500 UNIT/ML kwikpen 20-100 Units  20-100 Units Subcutaneous TID WC Samuella Cota, MD   60 Units at 09/12/19 1727  . ivabradine (CORLANOR) tablet 5 mg  5 mg Oral BID WC [name], MD   5 mg at  09/13/19 0820  . LORazepam (ATIVAN) injection 1 mg  1 mg Intravenous Once [name], MD      . nystatin ointment (MYCOSTATIN)   Topical BID Ivor Costa, MD   Given at 09/13/19 [phone]  . pantoprazole (PROTONIX) EC tablet 40 mg  40 mg Oral Daily [name], MD   40 mg at 09/13/19 7867  . sacubitril-valsartan (ENTRESTO) 49-51 mg per tablet  1 tablet Oral BID Samuella Cota, MD   1 tablet at 09/13/19 0820  . spironolactone (ALDACTONE) tablet 25 mg  25 mg Oral Daily Ivor Costa, MD   25 mg at 09/13/19 6720    Musculoskeletal: Strength & Muscle Tone: within normal limits Gait & Station: unsteady Patient leans: N/A  Psychiatric Specialty Exam: Physical Exam  Nursing note and vitals reviewed. Constitutional: She is oriented to person, place, and time. She appears well-developed and well-nourished.  HENT:  Head: Normocephalic.  Respiratory: Effort normal.  Musculoskeletal:  General: Normal range of motion.     Cervical back: Normal range of motion.  Neurological: She is alert and oriented to person, place, and time.  Psychiatric: Her speech is normal and behavior is normal. Thought content normal. Her mood appears anxious. Cognition and memory are impaired. She expresses impulsivity.    Review of Systems  Psychiatric/Behavioral: Positive for memory loss. The patient is nervous/anxious.   All other systems reviewed and are negative.   Blood pressure 117/69, pulse 89, temperature 98 F (36.7 C), temperature source Oral, resp. rate 18, height 5' 4"  (1.626 m), weight 65 kg, SpO2 95 %.Body mass index is 24.61 kg/m.  General Appearance: Casual  Eye Contact:  Good  Speech:  Clear and Coherent  Volume:  Normal  Mood: Irritable  Affect:  Appropriate  Thought Process: Coherent  Orientation:  Full (Time, Place, and Person)  Thought Content: WDL  Suicidal Thoughts:  No  Homicidal Thoughts:  No  Memory: Fair  Judgement:  Poor  Insight:  Shallow  Psychomotor Activity:  NA   Concentration:  Concentration: Fair  Recall:  [employer] of Knowledge:  Fair  Language:  Fair  Akathisia:  No  Handed:  Right  AIMS (if indicated):     Assets:  Communication Skills Desire for Improvement Social Support  ADL's:  Intact  Cognition:  Impaired,  Mild  Sleep:       ASSESSMENT: 68 year old woman with history of multiple medical issues, including diabetes , who was hospitalized due to her inability to maintain her own blood glucose level. Psychiatry consulted for altered mental status and to assess capacity to make medical decisions on 11/24.Marland Kitchen  Patient still appears not able to understand the severity of her medical condition and details of her regimen, so she is still lacking capacity to make medical decisions and is unable to take proper care of herself. Patient will need help on a daily basis to assist with medication delivery and adherence and will require assisted living facility placement.  Treatment plan: Impression: Neurocognitive disorder Unspecified anxiety disorder  Recommendations: -patient does not meet criteria for inpatient psych admission at this time; -continue Prozac 69m PO daily for anxiety; -Continue Haldol 220mPO/IM Q8h PRN agitation. -supportive therapy provided.  Disposition: No evidence of imminent risk to self or others at present.   Patient does not meet criteria for psychiatric inpatient admission. Supportive therapy provided about ongoing stressors.   JaWaylan BogaNP 09/13/2019 1:43 PM

## 2019-09-13 NOTE — Progress Notes (Signed)
Spoke with Advertising account executive to see if [facility] assistance was needed, as patient lacks capacity and has eloped to parking lot. Per Advertising account executive, doctor completing IVC paperwork and LCSW support not needed at this time. LCSW remains available as needed.

## 2019-09-13 NOTE — Progress Notes (Signed)
RN responded to missing persons alert after pt left the unit with then intention of leaving the hospital. Pt was found in the parking lot and got in her neighbors car whom she had called for a ride. Security was there as well as several other nurses. Pt had been deemed incapable to make decisions by primary doctor and two psychiatrists. [place] PD was called and pt was [employer]. Pt agreed to return to the unit in wheelchair. Pts brother who is financial guardian was contacted in regards to pts current status in the hospital. RN contacted [facility] in regards to pts current state. RN asked to fax over progress note in regard to pt being unable to make decisions which the pt completed. Pt was agitated when returned to the room, yelling at pt to get out of the room and refusing to take her insulin. Pt then removed her own IV.Pt reported to RN multiple times that her sugar normally runs in the 500-600s and [facility] had told her not to take insulin because it couldn't control it. Pt attempted to educate on the importance of taking her insulin in order to keep her blood sugar in an acceptable range but pt continued to refuse. Pt said she would consider taking it at noon.

## 2019-09-13 NOTE — Progress Notes (Signed)
[name] at [place] NAME: Kristy [name]    MR#:  [id number]  DATE OF BIRTH:  14-Mar-1951  SUBJECTIVE:   Earlier this morning, patient left the floor and was found in the parking lot.  She was adamant that she was leaving the hospital.  She was escorted back to her room by police.  He was involuntarily committed since she was trying to leave the hospital and did not have capacity to make medical decisions was therefore was a risk to harm herself.  At this time, patient is very angry.  She does not wish to engage in conversation.  REVIEW OF SYSTEMS:   Tolerating Diet: yes Tolerating PT: ambulatory  DRUG ALLERGIES:   Allergies  Allergen Reactions  . Fish Oil Other (See Comments)    Gout  . Glimepiride Other (See Comments)    REACTION: hypoglycemia  . Guanfacine Hcl Other (See Comments)    REACTION: unspecified  . Rosiglitazone Other (See Comments)    CHF    VITALS:  Blood pressure 117/69, pulse 89, temperature 98 F (36.7 C), temperature source Oral, resp. rate 18, height 5' 4"  (1.626 m), weight 65 kg, SpO2 95 %.  PHYSICAL EXAMINATION:   Physical Exam  General exam: Alert, awake, oriented x 3 Respiratory system: Clear to auscultation. Respiratory effort normal. Cardiovascular system:RRR. No murmurs, rubs, gallops. Gastrointestinal system: Abdomen is nondistended, soft and nontender. No organomegaly or masses felt. Normal bowel sounds heard. Central nervous system: Alert and oriented. No focal neurological deficits. Extremities: No C/C/E, +pedal pulses Skin: No rashes, lesions or ulcers Psychiatry: Judgement and insight appear normal. Mood & affect appropriate.    LABORATORY PANEL:  CBC No results for input(s): WBC, HGB, HCT, PLT in the last 168 hours.  Chemistries  No results for input(s): NA, K, CL, CO2, GLUCOSE, BUN, CREATININE, CALCIUM, MG, AST, ALT, ALKPHOS, BILITOT in the last 168 hours.  Invalid input(s):  GFRCGP Cardiac Enzymes No results for input(s): TROPONINI in the last 168 hours. RADIOLOGY:  No results found. ASSESSMENT AND PLAN:   68 year old woman PMH includes systolic CHF, Crohn's disease, chronic kidney disease, diabetes mellitus type 2 presented with altered mental status, hyperglycemia.  Glucose 1434 in ED.  Recently discharged 11/11 with DKA.  Reportedly noncompliant with medications.  Was admitted for acute encephalopathy secondary to hyperglycemic hyperosmolar state.  Diabetes mellitus type 2, uncontrolled, with CKD stage III -hemoglobin A1c 10.9 - We will continue present regimen including Lantus and U5 100.  -Lantus increased to 27 units bid  -pt has a very LABILE diabetes--overall most of the time stable  Anemia with drop in hemoglobin.   -Stable hgb 11.1  -GI recommended no further evaluation suggested. -Continue aspirin and Plavix -continue PPI  Acute metabolic encephalopathy, cognitive decline, depression, lack of capacity.   -Per stepdaughter [name] patient often skips medication with non compliance.  She has had multiple admissions in the past 2 months for uncontrolled blood sugars and associated hyperosmolar state. -Per 2  Psychiatrist Drs Danella Sensing and Cristofano, patient not able to understand the details of her insulin regimen nor administered on a regular basis. Will need help on a daily basis to assist with medication delivery and adherence.  -Seen in follow-up by psychiatry on 11/24 and 11/26, impression was neurocognitive disorder and unspecified anxiety disorder.  Patient does not appear to understand severity of medical condition and is still lacking in capacity to make medical decisions and unable to take care of herself. -Since  patient left the hospital this morning and was found by police in the parking lot, she expressed multiple times that she did not want to live.  She was reevaluated by psychiatry on 12/12 and was not felt to be suicidal, but continued to  lack capacity in making medical decisions. -please review CSW notesregarding pt's discharge planning--COMPLEX d/c planning  Chronic systolic CHF, LVEF 30-16%. -Continue carvedilol, Aldactone, Entresto and ivabradine. -follows with [facility] cardiology  PPD placed 11/27.  Read 11/29.  No reaction noted.  Family communication : CSW has been in touch with step-dter --vicki, brother Sherren Mocha, APS/DSS is involved also Consults :Psych Discharge Disposition :TBD pt has a challenging discharge planning pending guardianship, she is not able to safely care for herself. CODE STATUS: FULL DVT Prophylaxis :SCD  TOTAL TIME TAKING CARE OF THIS PATIENT: *40* minutes.  >50% time spent on counselling and coordination of care   [name] M.D on 09/13/2019 at 6:02 PM   After 6pm go to [email or web address]  [facility]   CC: Primary care physician; Owens Loffler, MDPatient ID: Kristy [name], female   DOB: 1951-09-28, 68 y.o.   MRN: [id number]

## 2019-09-14 LAB — GLUCOSE, CAPILLARY
Glucose-Capillary: 70 mg/dL (ref 70–99)
Glucose-Capillary: 185 mg/dL — ABNORMAL HIGH (ref 70–99)
Glucose-Capillary: 286 mg/dL — ABNORMAL HIGH (ref 70–99)
Glucose-Capillary: 277 mg/dL — ABNORMAL HIGH (ref 70–99)
Glucose-Capillary: 135 mg/dL — ABNORMAL HIGH (ref 70–99)

## 2019-09-14 NOTE — Progress Notes (Addendum)
[place] at [place] NAME: Kristy [name]    MR#:  [id number]  DATE OF BIRTH:  Oct 21, 1950  SUBJECTIVE:   Patient is resting in bed.  She says she was up late last night speaking to the sitter.  She says she wants to go home.  Does not want to further engage in conversation.  REVIEW OF SYSTEMS:   Tolerating Diet: yes Tolerating PT: ambulatory  DRUG ALLERGIES:   Allergies  Allergen Reactions  . Fish Oil Other (See Comments)    Gout  . Glimepiride Other (See Comments)    REACTION: hypoglycemia  . Guanfacine Hcl Other (See Comments)    REACTION: unspecified  . Rosiglitazone Other (See Comments)    CHF    VITALS:  Blood pressure (!) 119/53, pulse 83, temperature 98.2 F (36.8 C), resp. rate 19, height 5' 4"  (1.626 m), weight 65 kg, SpO2 96 %.  PHYSICAL EXAMINATION:   Physical Exam  General exam: Alert, awake, oriented x 3 Respiratory system: Clear to auscultation. Respiratory effort normal. Cardiovascular system:RRR. No murmurs, rubs, gallops.  LABORATORY PANEL:  CBC No results for input(s): WBC, HGB, HCT, PLT in the last 168 hours.  Chemistries  No results for input(s): NA, K, CL, CO2, GLUCOSE, BUN, CREATININE, CALCIUM, MG, AST, ALT, ALKPHOS, BILITOT in the last 168 hours.  Invalid input(s): GFRCGP Cardiac Enzymes No results for input(s): TROPONINI in the last 168 hours. RADIOLOGY:  No results found. ASSESSMENT AND PLAN:   68 year old woman PMH includes systolic CHF, Crohn's disease, chronic kidney disease, diabetes mellitus type 2 presented with altered mental status, hyperglycemia.  Glucose 1434 in ED.  Recently discharged 11/11 with DKA.  Reportedly noncompliant with medications. Was admitted for acute encephalopathy secondary to hyperglycemic hyperosmolar state.  Diabetes mellitus type 2, uncontrolled, with CKD stage III -hemoglobin A1c 10.9 - We will continue present regimen including Lantus and U5 100.  -Lantus  increased to 27 units bid  -pt has a very LABILE diabetes--overall most of the time stable  Anemia with drop in hemoglobin.   -Stable hgb 11.1  -GI recommended no further evaluation suggested. -Continue aspirin and Plavix -continue PPI  Acute metabolic encephalopathy, cognitive decline, depression, lack of capacity.   -Per stepdaughter [name] patient often skips medication with non compliance.  She has had multiple admissions in the past 2 months for uncontrolled blood sugars and associated hyperosmolar state. -Per 2  Psychiatrist Drs Danella Sensing and Cristofano, patient not able to understand the details of her insulin regimen nor administered on a regular basis. Will need help on a daily basis to assist with medication delivery and adherence.  -Seen in follow-up by psychiatry on 11/24 and 11/26, impression was neurocognitive disorder and unspecified anxiety disorder.  Patient does not appear to understand severity of medical condition and is still lacking in capacity to make medical decisions and unable to take care of herself. -Patient had left her room without informing staff on 12/12 and was found by police in the parking lot. She expressed multiple times that she did not want to live.  She was reevaluated by psychiatry on 12/12 and was not felt to be suicidal, but continued to lack capacity in making medical decisions. -please review CSW notesregarding pt's discharge planning--COMPLEX d/c planning  Chronic systolic CHF, LVEF 12-24%. -Continue carvedilol, Aldactone, Entresto and ivabradine. -follows with [facility] cardiology  PPD placed 11/27.  Read 11/29.  No reaction noted.  Family communication : CSW has been in touch with  step-dter --vicki, brother Sherren Mocha, APS/DSS is involved also Consults :Psych Discharge Disposition :TBD pt has a challenging discharge planning pending guardianship, she is not able to safely care for herself. CODE STATUS: FULL DVT Prophylaxis :SCD  TOTAL TIME TAKING CARE  OF THIS PATIENT: *20* minutes.  >50% time spent on counselling and coordination of care   [name] M.D on 09/14/2019 at 4:34 PM   After 6pm go to [email or web address]  [facility]   CC: Primary care physician; Owens Loffler, MDPatient ID: Kristy [name], female   DOB: Feb 23, 1951, 68 y.o.   MRN: [id number]

## 2019-09-15 LAB — GLUCOSE, CAPILLARY
Glucose-Capillary: 163 mg/dL — ABNORMAL HIGH (ref 70–99)
Glucose-Capillary: 386 mg/dL — ABNORMAL HIGH (ref 70–99)
Glucose-Capillary: 198 mg/dL — ABNORMAL HIGH (ref 70–99)
Glucose-Capillary: 149 mg/dL — ABNORMAL HIGH (ref 70–99)

## 2019-09-15 NOTE — Progress Notes (Signed)
Inpatient Diabetes Program Recommendations  AACE/ADA: New Consensus Statement on Inpatient Glycemic Control (2015)  Target Ranges:  Prepandial:   less than 140 mg/dL      Peak postprandial:   less than 180 mg/dL (1-2 hours)      Critically ill patients:  140 - 180 mg/dL   Results for CLAUDETTE, WERMUTH (MRN [id number]) as of 09/15/2019 14:53  Ref. Range 09/15/2019 07:53 09/15/2019 11:39  Glucose-Capillary Latest Ref Range: 70 - 99 mg/dL 163 (H) 386 (H)    Home DM Meds:Concentrated U500 Insulin QID (before meals and at bedtime) <200- 0 units 201-300- 20 units 301-400- 50 units 401-500- 90 units >500- 100 units  Current Orders:Concentrated U500 Insulin 20-100 units TID with meals according to the following scale: 201-300-30 units 301-400-60 units 401-500-100 units >500- 110 units [id number]     MD- Note patient with very labile CBGs. It looks as if CBGs have been more stable after the introduction of the Lantus insulin.  I question if patient is actually absorbing such large doses of the Concentrated U500 Insulin??   Note when patient does not get any U500 Insulin in the AM she has significantly elevated CBGs at 12pm.  Perhaps we could try adjusting her U500 Insulin scale to allow her to get some U500 Insulin in the AM to try to prevent the 12pm elevations??  Recommend adding a scale for CBGs less than 200: Add the following to pt's current U500 Insulin scale: CBG 150-200- give 20 units    --Will follow patient during hospitalization--  Wyn Quaker RN, MSN, CDE Diabetes Coordinator Inpatient Glycemic Control Team Team Pager: [phone] (8a-5p)

## 2019-09-15 NOTE — Plan of Care (Signed)
Patient doing well today.  She was upset this morning about her phone being turned off.  She is pleasant though.  Tolerating her meals well.  FSBS up and down.  No complaints of pain.  IVC sitter still in place.  No significant changes.

## 2019-09-15 NOTE — Progress Notes (Signed)
[name] at [place] NAME: Kristy [name]    MR#:  [id number]  DATE OF BIRTH:  Aug 17, 1951  SUBJECTIVE:  Patient does not have any new complaints today.  Denies any shortness of breath or pain.  REVIEW OF SYSTEMS:   Tolerating Diet: yes Tolerating PT: ambulatory  DRUG ALLERGIES:   Allergies  Allergen Reactions  . Fish Oil Other (See Comments)    Gout  . Glimepiride Other (See Comments)    REACTION: hypoglycemia  . Guanfacine Hcl Other (See Comments)    REACTION: unspecified  . Rosiglitazone Other (See Comments)    CHF    VITALS:  Blood pressure 125/76, pulse 69, temperature 98.5 F (36.9 C), temperature source Oral, resp. rate 16, height 5' 4"  (1.626 m), weight 65 kg, SpO2 96 %.  PHYSICAL EXAMINATION:   Physical Exam  General exam: Alert, awake, oriented x 3 Respiratory system: Clear to auscultation. Respiratory effort normal. Cardiovascular system:RRR. No murmurs, rubs, gallops.    LABORATORY PANEL:  CBC No results for input(s): WBC, HGB, HCT, PLT in the last 168 hours.  Chemistries  No results for input(s): NA, K, CL, CO2, GLUCOSE, BUN, CREATININE, CALCIUM, MG, AST, ALT, ALKPHOS, BILITOT in the last 168 hours.  Invalid input(s): GFRCGP Cardiac Enzymes No results for input(s): TROPONINI in the last 168 hours. RADIOLOGY:  No results found. ASSESSMENT AND PLAN:   68 year old woman PMH includes systolic CHF, Crohn's disease, chronic kidney disease, diabetes mellitus type 2 presented with altered mental status, hyperglycemia.  Glucose 1434 in ED.  Recently discharged 11/11 with DKA.  Reportedly noncompliant with medications. Was admitted for acute encephalopathy secondary to hyperglycemic hyperosmolar state.  Diabetes mellitus type 2, uncontrolled, with CKD stage III -hemoglobin A1c 10.9 - We will continue present regimen including Lantus and U5 100.  -Lantus increased to 27 units bid  -pt has a very LABILE  diabetes--overall most of the time stable  Anemia with drop in hemoglobin.   -Stable hgb 11.1  -GI recommended no further evaluation suggested. -Continue aspirin and Plavix -continue PPI  Acute metabolic encephalopathy, cognitive decline, depression, lack of capacity.   -Per stepdaughter Kristy [name] patient often skips medication with non compliance.  She has had multiple admissions in the past 2 months for uncontrolled blood sugars and associated hyperosmolar state. -Per 2  Psychiatrist Drs Danella Sensing and Cristofano, patient not able to understand the details of her insulin regimen nor administered on a regular basis. Will need help on a daily basis to assist with medication delivery and adherence.  -Seen in follow-up by psychiatry on 11/24 and 11/26, impression was neurocognitive disorder and unspecified anxiety disorder.  Patient does not appear to understand severity of medical condition and is still lacking in capacity to make medical decisions and unable to take care of herself. -Patient had left her room without informing staff on 12/12 and was found by police in the parking lot. She expressed multiple times that she did not want to live.  She was reevaluated by psychiatry on 12/12 and was not felt to be suicidal, but continued to lack capacity in making medical decisions. -please review CSW notesregarding pt's discharge planning--COMPLEX d/c planning  Chronic systolic CHF, LVEF 65-53%. -Continue carvedilol, Aldactone, Entresto and ivabradine. -follows with [facility] cardiology  PPD placed 11/27.  Read 11/29.  No reaction noted.  Family communication : CSW has been in touch with step-dter --vicki, brother Kristy [name], APS/DSS is involved also Consults :Psych Discharge Disposition :TBD pt has a  challenging discharge planning pending guardianship, she is not able to safely care for herself.  TOC team has been working on resolving this. CODE STATUS: FULL DVT Prophylaxis :SCD  TOTAL TIME TAKING CARE OF  THIS PATIENT: *20* minutes.  >50% time spent on counselling and coordination of care   Kristy [name] M.D on 09/15/2019 at 8:19 PM   After 6pm go to [email or web address]  [facility]   CC: Primary care physician; Kristy [name], MDPatient ID: Kristy [name], female   DOB: Jan 21, 1951, 68 y.o.   MRN: [id number]

## 2019-09-15 NOTE — TOC Transition Note (Signed)
Transition of Care [facility]) - CM/SW Discharge Note   Patient Details  Name: Kristy [name] MRN: [id number] Date of Birth: 1950-12-20  [facility] CM/SW Contact:  Beverly Sessions, RN Phone Number: 09/15/2019, 4:58 PM   Clinical Narrative:     Patient now under IVC RNCM spoke with Jaynee Eagles at [facility] [phone]).  I have confirmed they do NOT have guardianship.  They have an emergency protective order which is valid for 14 days. This will allow them to sign the patient in to a facility, however does not give them access to the financial piece.    RNCM spoke with POA and he states he reach out to the family attorney today, and is still working on getting acess to the financial accounts.   RNCM will reach out again tomorrow to [facility], to confirm they still have a spot, and get additional information from them to provide to [facility] and APS     Barriers to Discharge: Ship broker, Continued Medical Work up   Patient Goals and CMS Choice        Discharge Placement                       Discharge Plan and Services     Post Acute Care Choice: [place]                               Social Determinants of Health (SDOH) Interventions     Readmission Risk Interventions Readmission Risk Prevention Plan 04/28/2019 04/27/2019  Post Dischage Appt - Complete  Medication Screening - Complete  Transportation Screening - Complete  PCP or Specialist Appt within 5-7 Days Complete -  Home Care Screening Complete -  Medication Review (RN CM) Complete -  Some recent data might be hidden

## 2019-09-16 LAB — GLUCOSE, CAPILLARY
Glucose-Capillary: 251 mg/dL — ABNORMAL HIGH (ref 70–99)
Glucose-Capillary: 289 mg/dL — ABNORMAL HIGH (ref 70–99)
Glucose-Capillary: 295 mg/dL — ABNORMAL HIGH (ref 70–99)
Glucose-Capillary: 279 mg/dL — ABNORMAL HIGH (ref 70–99)

## 2019-09-16 MED ORDER — INSULIN ASPART 100 UNIT/ML ~~LOC~~ SOLN
0.0000 [IU] | Freq: Three times a day (TID) | SUBCUTANEOUS | Status: DC
  Administered 2019-09-17: 7 [IU] via SUBCUTANEOUS
  Administered 2019-09-17: 4 [IU] via SUBCUTANEOUS
  Administered 2019-09-17: 7 [IU] via SUBCUTANEOUS
  Administered 2019-09-18 (×2): 11 [IU] via SUBCUTANEOUS
  Administered 2019-09-19: 4 [IU] via SUBCUTANEOUS
  Administered 2019-09-19: 7 [IU] via SUBCUTANEOUS
  Administered 2019-09-20 (×2): 11 [IU] via SUBCUTANEOUS
  Administered 2019-09-21: 15 [IU] via SUBCUTANEOUS
  Administered 2019-09-21 – 2019-09-22 (×2): 7 [IU] via SUBCUTANEOUS
  Administered 2019-09-22: 15 [IU] via SUBCUTANEOUS
  Administered 2019-09-23: 11 [IU] via SUBCUTANEOUS
  Administered 2019-09-23 (×2): 3 [IU] via SUBCUTANEOUS
  Filled 2019-09-16 (×18): qty 1

## 2019-09-16 MED ORDER — INSULIN ASPART 100 UNIT/ML ~~LOC~~ SOLN
10.0000 [IU] | Freq: Three times a day (TID) | SUBCUTANEOUS | Status: DC
  Administered 2019-09-17: 10 [IU] via SUBCUTANEOUS
  Filled 2019-09-16: qty 1

## 2019-09-16 NOTE — Progress Notes (Signed)
   09/16/19 0900  Clinical Encounter Type  Visited With Patient;Health care provider  Visit Type Follow-up;Psychological support;Spiritual support  Ch followed up with the pt. Pt shared about her attempt to leave the hospital last Saturday. Even though she had a rough weekend, pt seems stable and is dealing with the frustration of long hospitalization well. The sitter seems to be of big help to her as she provides emotional support and companionship. Ch relayed the caring messages she received from the church and pt was glad. Ch will continue to provide emotional and spiritual support.

## 2019-09-16 NOTE — Progress Notes (Signed)
Inpatient Diabetes Program Recommendations  AACE/ADA: New Consensus Statement on Inpatient Glycemic Control  Target Ranges:  Prepandial:   less than 140 mg/dL      Peak postprandial:   less than 180 mg/dL (1-2 hours)      Critically ill patients:  140 - 180 mg/dL  Results for SAVAHNA, CASADOS (MRN [id number]) as of 09/16/2019 08:48  Ref. Range 09/15/2019 07:53 09/15/2019 9:43 09/15/2019 11:39 09/15/2019 17:03 09/15/2019 22:13 09/16/2019 08:09  Glucose-Capillary Latest Ref Range: 70 - 99 mg/dL 163 (H)    Lantus 27 units 386 (H)  U500 60 units 198 (H) 149 (H) 251 (H)   Results for ALAZNE, QUANT (MRN [id number]) as of 09/16/2019 08:48  Ref. Range 09/14/2019 07:53 09/14/2019 10:12 09/14/2019 11:19 09/14/2019 16:05 09/14/2019 21:07  Glucose-Capillary Latest Ref Range: 70 - 99 mg/dL 185 (H)    Lantus 27 units 286 (H)  U500 30 units 277 (H)  U500 30 units 135 (H)   Lantus 27 units   Review of Glycemic Control  Diabetes history: DM2 Outpatient Diabetes medications: Humulin R U500 0-100 units QID (with meals and at bedtime)per SSI <200- 0 units 201-300- 20 units 300-400- 50 units 401-500- 90 units >500- 100 units Current orders for Inpatient glycemic control:Lantus 27 units BID, Humulin R U500 0-110 units TID with meals per SSI <200- 0 units  201-300- 30 units  301-400- 60 units  401-500- 100 units  >500- 110 units    Inpatient Diabetes Program Recommendations:   Insulin:Please discontinue Humulin R U500 insulin, order Novolog 0-20 units AC&HS, and Novolog 10 units TID with meals for meal coverage if patient eats at least 50% of meals (continue Lantus as ordered).  NOTE:   Noted Lantus 27 units was NOT given last night (charted as patient refused).  Also noted, patient only received Humulin R U500 one time on 09/15/19 at 12:51  (60 units given) per scale currently ordered.  Glucose has been variable since admitted and ranged from 149-386 mg/dl over the past 24 hours. Question if patient is absorbing large doses of Humulin R U500 insulin and if glucose would be better controlled on set dose of Lantus and Novolog meal coverage. Recommend discontinuing Humulin R U500 and using Lantus, Novolog meal coverage, and Novolog correction.  Thanks, Barnie Alderman, RN, MSN, CDE Diabetes Coordinator Inpatient Diabetes Program [phone] (Team Pager from 8am to 5pm)

## 2019-09-16 NOTE — Progress Notes (Signed)
[place] [facility] [place] NAME: Kristy [name]    MR#:  [id number]  DATE OF BIRTH:  1951-05-08  SUBJECTIVE:  Patient says that she is willing to change out her insulin from U5 100 to NovoLog.  She denies any pain, shortness of breath.  REVIEW OF SYSTEMS:   Tolerating Diet: yes Tolerating PT: ambulatory  DRUG ALLERGIES:   Allergies  Allergen Reactions  . Fish Oil Other (See Comments)    Gout  . Glimepiride Other (See Comments)    REACTION: hypoglycemia  . Guanfacine Hcl Other (See Comments)    REACTION: unspecified  . Rosiglitazone Other (See Comments)    CHF    VITALS:  Blood pressure (!) 115/45, pulse 65, temperature (!) 97.5 F (36.4 C), temperature source Oral, resp. rate 18, height 5' 4"  (1.626 m), weight 65 kg, SpO2 95 %.  PHYSICAL EXAMINATION:   Physical Exam  General exam: Alert, awake, oriented x 3 Respiratory system: Clear to auscultation. Respiratory effort normal. Cardiovascular system:RRR. No murmurs, rubs, gallops.  LABORATORY PANEL:  CBC No results for input(s): WBC, HGB, HCT, PLT in the last 168 hours.  Chemistries  No results for input(s): NA, K, CL, CO2, GLUCOSE, BUN, CREATININE, CALCIUM, MG, AST, ALT, ALKPHOS, BILITOT in the last 168 hours.  Invalid input(s): GFRCGP Cardiac Enzymes No results for input(s): TROPONINI in the last 168 hours. RADIOLOGY:  No results found. ASSESSMENT AND PLAN:   68 year old woman PMH includes systolic CHF, Crohn's disease, chronic kidney disease, diabetes mellitus type 2 presented with altered mental status, hyperglycemia.  Glucose 1434 in ED.  Recently discharged 11/11 with DKA.  Reportedly noncompliant with medications. Was admitted for acute encephalopathy secondary to hyperglycemic hyperosmolar state.  Diabetes mellitus type 2, uncontrolled, with CKD stage III -hemoglobin A1c 10.9 - Blood sugars have been very labile. -.  Diabetes coordinator recommendations, will change  insulin from U500 to NovoLog.  Continue Lantus  Anemia with drop in hemoglobin.   -Stable hgb 11.1  -GI recommended no further evaluation suggested. -Continue aspirin and Plavix -continue PPI  Acute metabolic encephalopathy, cognitive decline, depression, lack of capacity.   -Per stepdaughter [name] patient often skips medication with non compliance.  She has had multiple admissions in the past 2 months for uncontrolled blood sugars and associated hyperosmolar state. -Per 2  Psychiatrist Drs Danella Sensing and Cristofano, patient not able to understand the details of her insulin regimen nor administered on a regular basis.   She will need help on a daily basis to assist with medication delivery and adherence.  -Seen in follow-up by psychiatry on 11/24 and 11/26, impression was neurocognitive disorder and unspecified anxiety disorder.  Patient does not appear to understand severity of medical condition and is still lacking in capacity to make medical decisions and unable to take care of herself. -On 12/12, patient had left her room without informing staff and was found by police in the parking lot. She expressed multiple times that she did not want to live.  She was reevaluated by psychiatry on 12/12 and was not felt to be suicidal, but continued to lack capacity in making medical decisions. -please review CSW notesregarding pt's discharge planning--COMPLEX d/c planning -Transition of care team is currently working on establishing guardianship and placement to ensure safe discharge  Chronic systolic CHF, LVEF 14-43%. -Continue carvedilol, Aldactone, Entresto and ivabradine. -follows with [facility] cardiology  PPD placed 11/27.  Read 11/29.  No reaction noted.  Family communication : CSW has been  in touch with step-dter --vicki, brother Sherren Mocha, APS/DSS is involved also Consults :Psych Discharge Disposition :TBD, pt has a challenging discharge planning pending guardianship, she is not able to safely care  for herself.  TOC team has been working on resolving this. CODE STATUS: FULL DVT Prophylaxis :SCD  TOTAL TIME TAKING CARE OF THIS PATIENT: *20* minutes.  >50% time spent on counselling and coordination of care   [name] M.D on 09/16/2019 at 8:50 PM   After 6pm go to [email or web address]  [facility]   CC: Primary care physician; Owens Loffler, MDPatient ID: Kristy [name], female   DOB: [birth year], 68 y.o.   MRN: [id number]

## 2019-09-17 LAB — GLUCOSE, CAPILLARY
Glucose-Capillary: 197 mg/dL — ABNORMAL HIGH (ref 70–99)
Glucose-Capillary: 243 mg/dL — ABNORMAL HIGH (ref 70–99)
Glucose-Capillary: 240 mg/dL — ABNORMAL HIGH (ref 70–99)
Glucose-Capillary: 305 mg/dL — ABNORMAL HIGH (ref 70–99)

## 2019-09-17 MED ORDER — INSULIN ASPART 100 UNIT/ML ~~LOC~~ SOLN: 12.0000 [IU] | Freq: Three times a day (TID) | SUBCUTANEOUS | Status: DC

## 2019-09-17 MED ORDER — INSULIN ASPART 100 UNIT/ML ~~LOC~~ SOLN
10.0000 [IU] | Freq: Three times a day (TID) | SUBCUTANEOUS | Status: DC
  Administered 2019-09-17 (×2): 10 [IU] via SUBCUTANEOUS
  Filled 2019-09-17 (×2): qty 1

## 2019-09-17 MED ORDER — INSULIN GLARGINE 100 UNIT/ML ~~LOC~~ SOLN
30.0000 [IU] | Freq: Two times a day (BID) | SUBCUTANEOUS | Status: DC
  Administered 2019-09-17 – 2019-09-18 (×2): 30 [IU] via SUBCUTANEOUS
  Filled 2019-09-17 (×3): qty 0.3

## 2019-09-17 NOTE — Progress Notes (Signed)
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  Currently pending guardianship as patient does not have capacity and is found to not have the ability to care for herself at home.  12/16: Patient sitting up in the room.  She is conversant.  She again expresses a desire to go home.  Sugars running in the mid to high 200s   Assessment & Plan:   Principal Problem:   Uncontrolled type 2 diabetes mellitus with stage 3 chronic kidney disease, with long-term current use of insulin (HCC) Active Problems:   Essential hypertension   LBBB (left bundle branch block)   Major depressive disorder, recurrent, in full remission ([place])   Coronary artery disease, non-occlusive   Chronic systolic CHF (congestive heart failure) (HCC)   NICM (nonischemic cardiomyopathy) (HCC)   Elevated troponin   Sepsis (HCC)   Acute metabolic encephalopathy   Thrombocytopenia (HCC)   Adjustment disorder with mixed disturbance of emotions and conduct  Diabetes mellitus type 2, uncontrolled, with CKD stage III -hemoglobin A1c 10.9 -Blood sugars have been very labile. -.  Diabetes coordinator recommendations, will change insulin from U500 to NovoLog.  Continue Lantus, dose increased to 30 units twice daily on 09/17/2019 -Continue NovoLog 10 3 times daily for 1 additional day.  If still not at glycemic goal will increase  Anemia with drop in hemoglobin. -Stable hgb 11.1  -GI recommended no further evaluation suggested. -Continueaspirin and Plavix -continue PPI  Acute metabolic encephalopathy, cognitive decline, depression, lack of capacity.  -Per stepdaughter [name] patient often skips medication with non compliance.  She has had multiple admissions in the past 2 months for uncontrolled blood sugars and associated hyperosmolar state. -Per 2  Psychiatrist Drs  Danella Sensing and Cristofano, patient not able to understand the details of her insulin regimen nor administered on a regular basis.  She will need help on a daily basis to assist with medication delivery and adherence.  -Seen in follow-up by psychiatry on 11/24 and 11/26, impression was neurocognitive disorder and unspecified anxiety disorder. Patient does not appear to understand severity of medical condition and is still lacking in capacity to make medical decisions and unable to take care of herself. -On 12/12, patient had left her room without informing staff and was found by police in the parking lot. She expressed multiple times that she did not want to live.  She was reevaluated by psychiatry on 12/12 and was not felt to be suicidal, but continued to lack capacity in making medical decisions. -please review CSW notesregarding pt's discharge planning--COMPLEX d/c planning -Transition of care team is currently working on establishing guardianship and placement to ensure safe discharge - 12/16: TOC update noted.  Per the guardian has been found.  Currently in the process of finding a facility willing to accept the patient.  Family has been updated by the case management team.  They are aware that once a facility is found we will be moving forward with the post discharge plan of care.  Chronic systolic CHF, LVEF 65-99%. -Continuecarvedilol, Aldactone,Entresto and ivabradine. -follows with [facility] cardiology  Family communication : CSW has been in touch with step-dter --vicki, brother Sherren Mocha, APS/DSS is involved also Consults :Psych Discharge Disposition :TBD, pt has a challenging discharge planning pending guardianship, she is not able to safely care for herself.  TOC team has been working on resolving this. CODE  STATUS: FULL DVT Prophylaxis :SCD    Subjective: Seen and examined No acute events overnight No new complaints Patient sitting up in chair  Objective: Vitals:   09/16/19 1709 09/16/19  1900 09/16/19 2022 09/17/19 0531  BP: 122/70  (!) 115/45 (!) 110/49  Pulse: 75  65 66  Resp: 16  18 16   Temp: 98.4 F (36.9 C) (!) 97.5 F (36.4 C)  98.3 F (36.8 C)  TempSrc: Oral Oral    SpO2: 98%  95% 95%  Weight:      Height:       No intake or output data in the 24 hours ending 09/17/19 1710 Filed Weights   08/29/19 0359 08/30/19 0409 08/31/19 0446  Weight: 64.5 kg 64.8 kg 65 kg    Examination:   General exam: Alert, awake, oriented x 3 Respiratory system: Clear to auscultation. Respiratory effort normal. Cardiovascular system:RRR. No murmurs, rubs, gallops.    Data Reviewed: I have personally reviewed following labs and imaging studies  CBC: No results for input(s): WBC, NEUTROABS, HGB, HCT, MCV, PLT in the last 168 hours. Basic Metabolic Panel: No results for input(s): NA, K, CL, CO2, GLUCOSE, BUN, CREATININE, CALCIUM, MG, PHOS in the last 168 hours. GFR: Estimated Creatinine Clearance: 58.1 mL/min (by C-G formula based on SCr of 0.71 mg/dL). Liver Function Tests: No results for input(s): AST, ALT, ALKPHOS, BILITOT, PROT, ALBUMIN in the last 168 hours. No results for input(s): LIPASE, AMYLASE in the last 168 hours. No results for input(s): AMMONIA in the last 168 hours. Coagulation Profile: No results for input(s): INR, PROTIME in the last 168 hours. Cardiac Enzymes: No results for input(s): CKTOTAL, CKMB, CKMBINDEX, TROPONINI in the last 168 hours. BNP (last 3 results) No results for input(s): PROBNP in the last 8760 hours. HbA1C: No results for input(s): HGBA1C in the last 72 hours. CBG: Recent Labs  Lab 09/16/19 1648 09/16/19 2032 09/17/19 0759 09/17/19 1157 09/17/19 1708  GLUCAP 295* 279* 197* 243* 240*   Lipid Profile: No results for input(s): CHOL, HDL, LDLCALC, TRIG, CHOLHDL, LDLDIRECT in the last 72 hours. Thyroid Function Tests: No results for input(s): TSH, T4TOTAL, FREET4, T3FREE, THYROIDAB in the last 72 hours. Anemia Panel: No  results for input(s): VITAMINB12, FOLATE, FERRITIN, TIBC, IRON, RETICCTPCT in the last 72 hours. Sepsis Labs: No results for input(s): PROCALCITON, LATICACIDVEN in the last 168 hours.  No results found for this or any previous visit (from the past 240 hour(s)).       Radiology Studies: No results found.      Scheduled Meds: . aspirin EC  81 mg Oral Daily  . carvedilol  12.5 mg Oral BID  . clopidogrel  75 mg Oral Q breakfast  . FLUoxetine  20 mg Oral Daily  . insulin aspart  0-20 Units Subcutaneous TID WC  . insulin aspart  10 Units Subcutaneous TID WC  . insulin glargine  30 Units Subcutaneous BID  . ivabradine  5 mg Oral BID WC  . LORazepam  1 mg Intravenous Once  . nystatin ointment   Topical BID  . pantoprazole  40 mg Oral Daily  . sacubitril-valsartan  1 tablet Oral BID  . spironolactone  25 mg Oral Daily   Continuous Infusions:   LOS: 26 days    Time spent: 25 minutes    Sidney Ace, MD [facility] Pager [phone]  If 7PM-7AM, please contact night-coverage [email or web address] Password [email or web address] 09/17/2019, 5:10 PM

## 2019-09-17 NOTE — Progress Notes (Signed)
Inpatient Diabetes Program Recommendations  AACE/ADA: New Consensus Statement on Inpatient Glycemic Control   Target Ranges:  Prepandial:   less than 140 mg/dL      Peak postprandial:   less than 180 mg/dL (1-2 hours)      Critically ill patients:  140 - 180 mg/dL   Results for Kristy [name], Kristy [name] (MRN [id number]) as of 09/17/2019 12:25  Ref. Range 09/16/2019 08:09 09/16/2019 11:35 09/16/2019 16:48 09/16/2019 20:32 09/17/2019 07:59 09/17/2019 11:57  Glucose-Capillary Latest Ref Range: 70 - 99 mg/dL 251 (H) 289 (H) 295 (H) 279 (H) 197 (H) 243 (H)   Review of Glycemic Control  Diabetes history: DM2 Outpatient Diabetes medications: Humulin R U500 0-100 unitsQID (with meals and at bedtime)per SSI <200- 0 units 201-300- 20 units 300-400- 50 units 401-500- 90 units >500- 100 units Current orders for Inpatient glycemic control: Lantus 27 units BID, Novolog 10 units TID with meals, Novolog 0-20 units TID with meals  Inpatient Diabetes Program Recommendations:   Insulin-Basal: Please consider increasing Lantus to 30 units BID.  Insulin-Meal Coverage: Please consider increasing meal coverage to Novolog 14 units TID with meals.  Thanks, Barnie Alderman, RN, MSN, CDE Diabetes Coordinator Inpatient Diabetes Program [phone] (Team Pager from 8am to 5pm)

## 2019-09-17 NOTE — TOC Progression Note (Signed)
Transition of Care [facility]) - Progression Note    Patient Details  Name: Kristy [name] MRN: [id number] Date of Birth: 1951-05-13  Transition of [facility] CM/SW Contact  Beverly Sessions, RN Phone Number: 09/17/2019, 4:23 PM  Clinical Narrative:    09/16/19 -  Notified that patient has been assigned guardian ad litem [name] [phone].  RNCM spoke with Gershon Mussel and he completed a brief assessment via phone with the patient.  Gershon Mussel states that he has the ability to sign patient in to a facility, and has been in contact with financial POA Mr Sheran Spine  RNCM reached out to Jackson at [facility], she was able to give me the estimated monthly cost but now states they have not have bed available, and would let me know in the afternoon.  RNCM reached out to [facility] again at 3:00 pm and was told she would call me back, RNCM reached out again at 430 pm left a voicemail and I have not received a return call  RNCM completed 3 way call with [name] (stepdaughter) and Mr Sheran Spine (brother who is POA).  Everyone is on the same page that patient can not return home, and will be private paying going to a facility. Mr Sheran Spine will be working on the Art therapist.  Both are aware that once we have a facility that can accept the patient we will need to move forward with the discharge plan.    RNCM spoke with Adela Lank at [facility].  She will review the referral.  Referral sent in the hub to [facility], [facility], [facility] care.   RNCM reached out to [facility].  He requested for [facility] up update department leadership on [place]  12/16 -  - Updated Danice Goltz Department leadership, also requested she follow up with Ethel Rana regarding some concerns she had  [facility] has offered in the hub - RNCM called Blenda Peals - Voicemail left for [place] spoke with [place] at [facility] faxed - [place] spoke with Marden Noble at [facility] - they are able to  accept patient.  $361 per day private pay for semi private locked memory care [facility]  RNCM reached out to Elane Fritz at [facility].  She is in agreement to move forward with discharging to [facility] or [facility].  Her first choice is [facility] held a 3 way call with Ethel Rana and [employer].  They are aware of the 2 offers for [facility] and [facility].  [name] and [place] first choice is also go [facility].  VM left for Shelia at [facility].  Sherren Mocha has an appointment at patient's bank tomorrow at 2pm     Expected Discharge Plan: Memory Care Barriers to Discharge: Insurance Authorization, Continued Medical Work up  Expected Discharge Plan and Services Expected Discharge Plan: [place] Acute Care Choice: [place] Living arrangements for the past 2 months: Single Family Home                                       Social Determinants of Health (SDOH) Interventions    Readmission Risk Interventions Readmission Risk Prevention Plan 04/28/2019 04/27/2019  Post Dischage Appt - Complete  Medication Screening - Complete  Transportation Screening - Complete  PCP or Specialist Appt within 5-7 Days Complete -  Home Care Screening Complete -  Medication Review (RN CM) Complete -  Some recent data might be hidden

## 2019-09-18 LAB — GLUCOSE, CAPILLARY
Glucose-Capillary: 262 mg/dL — ABNORMAL HIGH (ref 70–99)
Glucose-Capillary: 289 mg/dL — ABNORMAL HIGH (ref 70–99)
Glucose-Capillary: 294 mg/dL — ABNORMAL HIGH (ref 70–99)
Glucose-Capillary: 113 mg/dL — ABNORMAL HIGH (ref 70–99)
Glucose-Capillary: 192 mg/dL — ABNORMAL HIGH (ref 70–99)

## 2019-09-18 MED ORDER — INSULIN ASPART 100 UNIT/ML ~~LOC~~ SOLN
12.0000 [IU] | Freq: Three times a day (TID) | SUBCUTANEOUS | Status: DC
  Administered 2019-09-18 – 2019-09-19 (×3): 12 [IU] via SUBCUTANEOUS
  Filled 2019-09-18 (×4): qty 1

## 2019-09-18 MED ORDER — INSULIN GLARGINE 100 UNIT/ML ~~LOC~~ SOLN
35.0000 [IU] | Freq: Two times a day (BID) | SUBCUTANEOUS | Status: DC
  Administered 2019-09-18 – 2019-09-22 (×7): 35 [IU] via SUBCUTANEOUS
  Filled 2019-09-18 (×9): qty 0.35

## 2019-09-18 NOTE — Progress Notes (Signed)
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  Currently pending guardianship as patient does not have capacity and is found to not have the ability to care for herself at home.  12/16: Patient sitting up in the room.  She is conversant.  She again expresses a desire to go home.  Sugars running in the mid to high 200s  12/17: Improve glycemic control but not at goal   Assessment & Plan:   Principal Problem:   Uncontrolled type 2 diabetes mellitus with stage 3 chronic kidney disease, with long-term current use of insulin (HCC) Active Problems:   Essential hypertension   LBBB (left bundle branch block)   Major depressive disorder, recurrent, in full remission ([place])   Coronary artery disease, non-occlusive   Chronic systolic CHF (congestive heart failure) (HCC)   NICM (nonischemic cardiomyopathy) (HCC)   Elevated troponin   Sepsis (HCC)   Acute metabolic encephalopathy   Thrombocytopenia (HCC)   Adjustment disorder with mixed disturbance of emotions and conduct  Diabetes mellitus type 2, uncontrolled, with CKD stage III -hemoglobin A1c 10.9 -Blood sugars have been very labile. -.  Diabetes coordinator recommendations, will change insulin from U500 to NovoLog.  Continue Lantus, dose increased to 35 units twice daily on 09/18/2019 -Continue NovoLog 12 units 3 times daily  Anemia with drop in hemoglobin. -Stable hgb -GI recommended no further evaluation suggested. -Continueaspirin and Plavix -continue PPI  Acute metabolic encephalopathy, cognitive decline, depression, lack of capacity.  -Per stepdaughter [name] patient often skips medication with non compliance.  She has had multiple admissions in the past 2 months for uncontrolled blood sugars and associated hyperosmolar state. -Per 2  Psychiatrist Drs Danella Sensing and  Cristofano, patient not able to understand the details of her insulin regimen nor administered on a regular basis.  She will need help on a daily basis to assist with medication delivery and adherence.  -Seen in follow-up by psychiatry on 11/24 and 11/26, impression was neurocognitive disorder and unspecified anxiety disorder. Patient does not appear to understand severity of medical condition and is still lacking in capacity to make medical decisions and unable to take care of herself. -On 12/12, patient had left her room without informing staff and was found by police in the parking lot. She expressed multiple times that she did not want to live.  She was reevaluated by psychiatry on 12/12 and was not felt to be suicidal, but continued to lack capacity in making medical decisions. -please review CSW notesregarding pt's discharge planning--COMPLEX d/c planning -Transition of care team is currently working on establishing guardianship and placement to ensure safe discharge - 12/16: TOC update noted.  Per the guardian has been found.  Currently in the process of finding a facility willing to accept the patient.  Family has been updated by the case management team.  They are aware that once a facility is found we will be moving forward with the post discharge plan of care.  Chronic systolic CHF, LVEF 73-53%. -Continuecarvedilol, Aldactone,Entresto and ivabradine. -follows with [facility] cardiology  Family communication : CSW has been in touch with step-dter --vicki, brother Sherren Mocha, APS/DSS is involved also Consults :Psych Discharge Disposition :TBD, pt has a challenging discharge planning pending guardianship, she is not able to safely care for herself.  TOC team has been working on resolving this. CODE STATUS: FULL DVT Prophylaxis :SCD  Subjective: Seen and examined No acute events overnight No new complaints Patient sitting up in chair  Objective: Vitals:   09/16/19 2022 09/17/19 0531  09/17/19 2047 09/18/19 0541  BP: (!) 115/45 (!) 110/49 (!) 117/56 114/60  Pulse: 65 66 67 65  Resp: 18 16 18 16   Temp:  98.3 F (36.8 C) (!) 97.4 F (36.3 C) 98.5 F (36.9 C)  TempSrc:   Oral   SpO2: 95% 95% 99% 96%  Weight:      Height:        Intake/Output Summary (Last 24 hours) at 09/18/2019 1417 Last data filed at 09/18/2019 1042 Gross per 24 hour  Intake 240 ml  Output --  Net 240 ml   Filed Weights   08/29/19 0359 08/30/19 0409 08/31/19 0446  Weight: 64.5 kg 64.8 kg 65 kg    Examination:   General exam: Alert, awake, oriented x 3 Respiratory system: Clear to auscultation. Respiratory effort normal. Cardiovascular system:RRR. No murmurs, rubs, gallops.    Data Reviewed: I have personally reviewed following labs and imaging studies  CBC: No results for input(s): WBC, NEUTROABS, HGB, HCT, MCV, PLT in the last 168 hours. Basic Metabolic Panel: No results for input(s): NA, K, CL, CO2, GLUCOSE, BUN, CREATININE, CALCIUM, MG, PHOS in the last 168 hours. GFR: CrCl cannot be calculated (Patient's most recent lab result is older than the maximum 21 days allowed.). Liver Function Tests: No results for input(s): AST, ALT, ALKPHOS, BILITOT, PROT, ALBUMIN in the last 168 hours. No results for input(s): LIPASE, AMYLASE in the last 168 hours. No results for input(s): AMMONIA in the last 168 hours. Coagulation Profile: No results for input(s): INR, PROTIME in the last 168 hours. Cardiac Enzymes: No results for input(s): CKTOTAL, CKMB, CKMBINDEX, TROPONINI in the last 168 hours. BNP (last 3 results) No results for input(s): PROBNP in the last 8760 hours. HbA1C: No results for input(s): HGBA1C in the last 72 hours. CBG: Recent Labs  Lab 09/17/19 1708 09/17/19 2049 09/18/19 0754 09/18/19 0937 09/18/19 1132  GLUCAP 240* 305* 262* 289* 294*   Lipid Profile: No results for input(s): CHOL, HDL, LDLCALC, TRIG, CHOLHDL, LDLDIRECT in the last 72 hours. Thyroid Function  Tests: No results for input(s): TSH, T4TOTAL, FREET4, T3FREE, THYROIDAB in the last 72 hours. Anemia Panel: No results for input(s): VITAMINB12, FOLATE, FERRITIN, TIBC, IRON, RETICCTPCT in the last 72 hours. Sepsis Labs: No results for input(s): PROCALCITON, LATICACIDVEN in the last 168 hours.  No results found for this or any previous visit (from the past 240 hour(s)).       Radiology Studies: No results found.      Scheduled Meds: . aspirin EC  81 mg Oral Daily  . carvedilol  12.5 mg Oral BID  . clopidogrel  75 mg Oral Q breakfast  . FLUoxetine  20 mg Oral Daily  . insulin aspart  0-20 Units Subcutaneous TID WC  . insulin aspart  12 Units Subcutaneous TID WC  . insulin glargine  30 Units Subcutaneous BID  . ivabradine  5 mg Oral BID WC  . LORazepam  1 mg Intravenous Once  . nystatin ointment   Topical BID  . pantoprazole  40 mg Oral Daily  . sacubitril-valsartan  1 tablet Oral BID  . spironolactone  25 mg Oral Daily   Continuous Infusions:   LOS: 27 days    Time spent: 25 minutes    Sidney Ace, MD [facility] Pager [phone]  If 7PM-7AM, please contact night-coverage [email or web address]  Password [email or web address] 09/18/2019, 2:17 PM

## 2019-09-18 NOTE — Progress Notes (Signed)
Inpatient Diabetes Program Recommendations  AACE/ADA: New Consensus Statement on Inpatient Glycemic Control (2015)  Target Ranges:  Prepandial:   less than 140 mg/dL      Peak postprandial:   less than 180 mg/dL (1-2 hours)      Critically ill patients:  140 - 180 mg/dL   Results for TIRSA, GAIL (MRN [id number]) as of 09/18/2019 09:16  Ref. Range 09/17/2019 07:59 09/17/2019 11:57 09/17/2019 17:08 09/17/2019 20:49  Glucose-Capillary Latest Ref Range: 70 - 99 mg/dL 197 (H)  14 units NOVOLOG +  27 units LANTUS given at 9:59am  243 (H)  17 units NOVOLOG  240 (H)  17 units NOVOLOG  305 (H)     30 units LANTUS given at 9:30pm   Results for DEBRALEE, BRAAKSMA (MRN [id number]) as of 09/18/2019 09:16  Ref. Range 09/18/2019 07:54  Glucose-Capillary Latest Ref Range: 70 - 99 mg/dL 262 (H)    Outpatient Diabetes medications:Humulin R U5000-100 unitsQID (with meals and at bedtime)per SSI <200- 0 units 201-300- 20 units 300-400- 50 units 401-500- 90 units >500- 100 units   Current Orders: Lantus 30 units BID       Novolog Resistant Correction Scale/ SSI (0-20 units) TID AC      Novolog 12 units TID with meals      MD- Please consider the following in-hospital insulin adjustments:  1. Increase Lantus to 35 units BID  2. Increase Novolog Meal Coverage to: Novolog 15 units TID with meals      --Will follow patient during hospitalization--  Wyn Quaker RN, MSN, CDE Diabetes Coordinator Inpatient Glycemic Control Team Team Pager: [phone] (8a-5p)

## 2019-09-18 NOTE — Care Management Important Message (Signed)
Important Message  Patient Details  Name: Kristy [name] MRN: [id number] Date of Birth: December 26, 1950   Medicare Important Message Given:  Yes  Reviewed verbally with stepdaughter, [name], via phone. Confirmed she did receive copy of Medicare IM emailed securely to her on 09/09/2019.     [name] 09/18/2019, 5:07 PM

## 2019-09-18 NOTE — TOC Progression Note (Addendum)
Transition of Care [facility]) - Progression Note    Patient Details  Name: Kristy [name] MRN: [id number] Date of Birth: [birth year]  [facility] of Care [facility]) CM/SW Contact  Beverly Sessions, RN Phone Number: 09/18/2019, 4:47 PM  Clinical Narrative:      Received return call from [place] at [facility].  She states the have received [place] waiver and can accept the patient. Per [name] she has left 2 voicemail for the [place] at [place] so they can discuss signing the patient in.    RNCM spoke with [name] at [place].  She states that she is waiting on [name] to fax her the information.   RNCM anticipates that discharge could be as soon as tomorrow. Requested for MD to order repeat covid  RNCM reached out to stepdaughter and POA to update.  They are both in agreement to plane.  Mr Sheran Spine POA states that things "went well at the bank but Carmita's attorney is telling him not to move forward with anything until they speak"  RNCM notified family that if facility can accept patient tomorrow, and APS is signing her in will will be moving forward with the plan  Expected Discharge Plan: Memory Care Barriers to Discharge: Insurance Authorization, Continued Medical Work up  Expected Discharge Plan and Services Expected Discharge Plan: [place] Acute Care Choice: [place] Living arrangements for the past 2 months: Single Family Home                                       Social Determinants of Health (SDOH) Interventions    Readmission Risk Interventions Readmission Risk Prevention Plan 04/28/2019 04/27/2019  Post Dischage Appt - Complete  Medication Screening - Complete  Transportation Screening - Complete  PCP or Specialist Appt within 5-7 Days Complete -  Home Care Screening Complete -  Medication Review (RN CM) Complete -  Some recent data might be hidden

## 2019-09-19 LAB — GLUCOSE, CAPILLARY
Glucose-Capillary: 199 mg/dL — ABNORMAL HIGH (ref 70–99)
Glucose-Capillary: 244 mg/dL — ABNORMAL HIGH (ref 70–99)
Glucose-Capillary: 74 mg/dL (ref 70–99)
Glucose-Capillary: 210 mg/dL — ABNORMAL HIGH (ref 70–99)

## 2019-09-19 LAB — SARS CORONAVIRUS 2 (TAT 6-24 HRS): SARS Coronavirus 2: NEGATIVE

## 2019-09-19 MED ORDER — INSULIN ASPART 100 UNIT/ML ~~LOC~~ SOLN
14.0000 [IU] | Freq: Three times a day (TID) | SUBCUTANEOUS | Status: DC
  Administered 2019-09-19 – 2019-09-21 (×6): 14 [IU] via SUBCUTANEOUS
  Filled 2019-09-19 (×6): qty 1

## 2019-09-19 NOTE — TOC Progression Note (Signed)
Transition of Care [facility]) - Progression Note    Patient Details  Name: ZAHARI FAZZINO MRN: [id number] Date of Birth: 05-01-1951  Transition of Care [facility]) CM/SW [place], [place] Phone Number: 09/19/2019, 3:29 PM  Clinical Narrative:     CSW has attempted to call APS worker, Elane Fritz with [facility] multiple times today @ [phone]. CSW has left a voicemail. [name] is needed to sign the patient into [facility].    Expected Discharge Plan: Memory Care Barriers to Discharge: Insurance Authorization, Continued Medical Work up  Expected Discharge Plan and Services Expected Discharge Plan: [place] Acute Care Choice: [place] Living arrangements for the past 2 months: Single Family Home                                       Social Determinants of Health (SDOH) Interventions    Readmission Risk Interventions Readmission Risk Prevention Plan 04/28/2019 04/27/2019  Post Dischage Appt - Complete  Medication Screening - Complete  Transportation Screening - Complete  PCP or Specialist Appt within 5-7 Days Complete -  Home Care Screening Complete -  Medication Review (RN CM) Complete -  Some recent data might be hidden

## 2019-09-19 NOTE — Progress Notes (Signed)
IVC paperwork renewal paperwork completed by Dr. Priscella Mann and sent to ED for processing by Eustaquio Maize, RN.

## 2019-09-19 NOTE — Progress Notes (Signed)
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  Currently pending guardianship as patient does not have capacity and is found to not have the ability to care for herself at home.  12/16: Patient sitting up in the room.  She is conversant.  She again expresses a desire to go home.  Sugars running in the mid to high 200s  12/17: Improve glycemic control but not at goal  12/18: Patient sitting up in chair.  Nursing working case management that the patient may be able to leave the hospital today.  Patient is aware and excited.   Assessment & Plan:   Principal Problem:   Uncontrolled type 2 diabetes mellitus with stage 3 chronic kidney disease, with long-term current use of insulin (HCC) Active Problems:   Essential hypertension   LBBB (left bundle branch block)   Major depressive disorder, recurrent, in full remission ([place])   Coronary artery disease, non-occlusive   Chronic systolic CHF (congestive heart failure) (HCC)   NICM (nonischemic cardiomyopathy) (HCC)   Elevated troponin   Sepsis (HCC)   Acute metabolic encephalopathy   Thrombocytopenia (HCC)   Adjustment disorder with mixed disturbance of emotions and conduct  Diabetes mellitus type 2, uncontrolled, with CKD stage III -hemoglobin A1c 10.9 -Blood sugars have been very labile. - Continue Lantus 14U BID -Continue NovoLog 14 units 3 times daily  Anemia with drop in hemoglobin. -Stable hgb -GI recommended no further evaluation suggested. -Continueaspirin and Plavix -continue PPI  Acute metabolic encephalopathy, cognitive decline, depression, lack of capacity.  -Per stepdaughter [name] patient often skips medication with non compliance.  She has had multiple admissions in the past 2 months for uncontrolled blood sugars and associated hyperosmolar state. -Per 2   Psychiatrist Drs Danella Sensing and Cristofano, patient not able to understand the details of her insulin regimen nor administered on a regular basis.  She will need help on a daily basis to assist with medication delivery and adherence.  -Seen in follow-up by psychiatry on 11/24 and 11/26, impression was neurocognitive disorder and unspecified anxiety disorder. Patient does not appear to understand severity of medical condition and is still lacking in capacity to make medical decisions and unable to take care of herself. -On 12/12, patient had left her room without informing staff and was found by police in the parking lot. She expressed multiple times that she did not want to live.  She was reevaluated by psychiatry on 12/12 and was not felt to be suicidal, but continued to lack capacity in making medical decisions. -please review CSW notesregarding pt's discharge planning--COMPLEX d/c planning -Transition of care team is currently working on establishing guardianship and placement to ensure safe discharge - 12/16: TOC update noted.  Per the guardian has been found.  Currently in the process of finding a facility willing to accept the patient.  Family has been updated by the case management team.  They are aware that once a facility is found we will be moving forward with the post discharge plan of care.  Chronic systolic CHF, LVEF 67-89%. -Continuecarvedilol, Aldactone,Entresto and ivabradine. -follows with [facility] cardiology  Family communication : CSW has been in touch with stepdaughter --vicki, brother Sherren Mocha, APS/DSS is involved also Consults :Psych Discharge Disposition :TBD, pt has a challenging discharge planning pending guardianship, she is not able to safely care for herself.  TOC team has been  working on resolving this.  Per CM notes, patient may be able to leave the hospital today CODE STATUS: FULL DVT Prophylaxis :SCD    Subjective: Seen and examined No acute events overnight No new  complaints Patient sitting up in chair  Objective: Vitals:   09/18/19 1429 09/18/19 2023 09/19/19 0600 09/19/19 1214  BP: (!) 98/49 113/73 (!) 103/48 114/66  Pulse: 71 73 71 72  Resp: 18 18 18 20   Temp: (!) 97.3 F (36.3 C) (!) 97.5 F (36.4 C) 98.4 F (36.9 C) 97.8 F (36.6 C)  TempSrc: Oral Oral Oral Oral  SpO2: 97% 98% 97% 100%  Weight:      Height:       No intake or output data in the 24 hours ending 09/19/19 1446 Filed Weights   08/29/19 0359 08/30/19 0409 08/31/19 0446  Weight: 64.5 kg 64.8 kg 65 kg    Examination:   General exam: Alert, awake, oriented x 3 Respiratory system: Clear to auscultation. Respiratory effort normal. Cardiovascular system:RRR. No murmurs, rubs, gallops.    Data Reviewed: I have personally reviewed following labs and imaging studies  CBC: No results for input(s): WBC, NEUTROABS, HGB, HCT, MCV, PLT in the last 168 hours. Basic Metabolic Panel: No results for input(s): NA, K, CL, CO2, GLUCOSE, BUN, CREATININE, CALCIUM, MG, PHOS in the last 168 hours. GFR: CrCl cannot be calculated (Patient's most recent lab result is older than the maximum 21 days allowed.). Liver Function Tests: No results for input(s): AST, ALT, ALKPHOS, BILITOT, PROT, ALBUMIN in the last 168 hours. No results for input(s): LIPASE, AMYLASE in the last 168 hours. No results for input(s): AMMONIA in the last 168 hours. Coagulation Profile: No results for input(s): INR, PROTIME in the last 168 hours. Cardiac Enzymes: No results for input(s): CKTOTAL, CKMB, CKMBINDEX, TROPONINI in the last 168 hours. BNP (last 3 results) No results for input(s): PROBNP in the last 8760 hours. HbA1C: No results for input(s): HGBA1C in the last 72 hours. CBG: Recent Labs  Lab 09/18/19 1132 09/18/19 1637 09/18/19 2033 09/19/19 0759 09/19/19 1212  GLUCAP 294* 113* 192* 199* 244*   Lipid Profile: No results for input(s): CHOL, HDL, LDLCALC, TRIG, CHOLHDL, LDLDIRECT in the last  72 hours. Thyroid Function Tests: No results for input(s): TSH, T4TOTAL, FREET4, T3FREE, THYROIDAB in the last 72 hours. Anemia Panel: No results for input(s): VITAMINB12, FOLATE, FERRITIN, TIBC, IRON, RETICCTPCT in the last 72 hours. Sepsis Labs: No results for input(s): PROCALCITON, LATICACIDVEN in the last 168 hours.  Recent Results (from the past 240 hour(s))  SARS CORONAVIRUS 2 (TAT 6-24 HRS) Nasopharyngeal Nasopharyngeal Swab     Status: None   Collection Time: 09/18/19  6:31 PM   Specimen: Nasopharyngeal Swab  Result Value Ref Range Status   SARS Coronavirus 2 NEGATIVE NEGATIVE Final    Comment: (NOTE) SARS-CoV-2 target nucleic acids are NOT DETECTED. The SARS-CoV-2 RNA is generally detectable in upper and lower respiratory specimens during the acute phase of infection. Negative results do not preclude SARS-CoV-2 infection, do not rule out co-infections with other pathogens, and should not be used as the sole basis for treatment or other patient management decisions. Negative results must be combined with clinical observations, patient history, and epidemiological information. The expected result is Negative. Fact Sheet for Patients: SugarRoll.be Fact Sheet for Healthcare Providers: https://www.woods-mathews.com/ This test is not yet approved or cleared by the [place] FDA and  has been authorized for detection and/or diagnosis of SARS-CoV-2 by FDA under an  Emergency Use Authorization (EUA). This EUA will remain  in effect (meaning this test can be used) for the duration of the COVID-19 declaration under Section 56 4(b)(1) of the Act, 21 U.S.C. section 360bbb-3(b)(1), unless the authorization is terminated or revoked sooner. Performed at [facility], [place] [address] [place], [place] [address]          Radiology Studies: No results found.      Scheduled Meds: . aspirin EC  81 mg Oral Daily  . carvedilol   12.5 mg Oral BID  . clopidogrel  75 mg Oral Q breakfast  . FLUoxetine  20 mg Oral Daily  . insulin aspart  0-20 Units Subcutaneous TID WC  . insulin aspart  14 Units Subcutaneous TID WC  . insulin glargine  35 Units Subcutaneous BID  . ivabradine  5 mg Oral BID WC  . LORazepam  1 mg Intravenous Once  . nystatin ointment   Topical BID  . pantoprazole  40 mg Oral Daily  . sacubitril-valsartan  1 tablet Oral BID  . spironolactone  25 mg Oral Daily   Continuous Infusions:   LOS: 28 days    Time spent: 25 minutes    Sidney Ace, MD [facility] Pager [phone]  If 7PM-7AM, please contact night-coverage [email or web address] Password [email or web address] 09/19/2019, 2:46 PM

## 2019-09-19 NOTE — TOC Progression Note (Signed)
Transition of Care [facility]) - Progression Note    Patient Details  Name: XAVIER FOURNIER MRN: [id number] Date of Birth: 27-Jan-1951  Transition of Care [facility]) CM/SW [place], [place] Phone Number: 09/19/2019, 4:19 PM  Clinical Narrative:     Per [name] at [facility] [place] denied the patient and she will need a payer before she can take the patient. [name] stated that she will get a write up of how much it is out of pocket and give that to CSW to inform the brother to see if they can pay out of pocket.   Expected Discharge Plan: Memory Care Barriers to Discharge: Insurance Authorization, Continued Medical Work up  Expected Discharge Plan and Services Expected Discharge Plan: [place] Acute Care Choice: [place] Living arrangements for the past 2 months: Single Family Home                                       Social Determinants of Health (SDOH) Interventions    Readmission Risk Interventions Readmission Risk Prevention Plan 04/28/2019 04/27/2019  Post Dischage Appt - Complete  Medication Screening - Complete  Transportation Screening - Complete  PCP or Specialist Appt within 5-7 Days Complete -  Home Care Screening Complete -  Medication Review (RN CM) Complete -  Some recent data might be hidden

## 2019-09-19 NOTE — Progress Notes (Signed)
   09/19/19 1000  Clinical Encounter Type  Visited With Patient;Health care provider  Visit Type Follow-up;Psychological support  Ch followed up with the pt to check on her as well as to relay the messages from her church friends. Pt was reading a book and seemed comfortable with no sign of distress. Ch sat and provided a listening ear as she talked about her long hospital stay, her church life, and about her deceased husband.

## 2019-09-20 LAB — GLUCOSE, CAPILLARY
Glucose-Capillary: 296 mg/dL — ABNORMAL HIGH (ref 70–99)
Glucose-Capillary: 282 mg/dL — ABNORMAL HIGH (ref 70–99)
Glucose-Capillary: 286 mg/dL — ABNORMAL HIGH (ref 70–99)
Glucose-Capillary: 86 mg/dL (ref 70–99)
Glucose-Capillary: 106 mg/dL — ABNORMAL HIGH (ref 70–99)

## 2019-09-20 NOTE — Progress Notes (Signed)
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  Currently pending guardianship as patient does not have capacity and is found to not have the ability to care for herself at home.  12/16: Patient sitting up in the room.  She is conversant.  She again expresses a desire to go home.  Sugars running in the mid to high 200s  12/17: Improve glycemic control but not at goal  12/18: Patient sitting up in chair.  Nursing working case management that the patient may be able to leave the hospital today.  Patient is aware and excited.  12/19: No changes. In bed.  Appears very comfortable Per Transition of care note, [employer] denied patient and she will need a payor prior to being taken to [place]:   Principal Problem:   Uncontrolled type 2 diabetes mellitus with stage 3 chronic kidney disease, with long-term current use of insulin ([place]) Active Problems:   Essential hypertension   LBBB (left bundle branch block)   Major depressive disorder, recurrent, in full remission ([place])   Coronary artery disease, non-occlusive   Chronic systolic CHF (congestive heart failure) (HCC)   NICM (nonischemic cardiomyopathy) (HCC)   Elevated troponin   Sepsis ([place])   Acute metabolic encephalopathy   Thrombocytopenia (HCC)   Adjustment disorder with mixed disturbance of emotions and conduct  Diabetes mellitus type 2, uncontrolled, with CKD stage III -hemoglobin A1c 10.9 -Blood sugars have been very labile. - Improved control over interval - Continue Lantus 35U BID -Continue NovoLog 14 units 3 times daily  Anemia with drop in hemoglobin. -Stable hgb -GI recommended no further evaluation suggested. -Continueaspirin and Plavix -continue PPI  Acute metabolic encephalopathy, cognitive decline, depression, lack of capacity.  -Per  stepdaughter [name] patient often skips medication with non compliance.  She has had multiple admissions in the past 2 months for uncontrolled blood sugars and associated hyperosmolar state. -Per 2  Psychiatrist Drs Danella Sensing and Cristofano, patient not able to understand the details of her insulin regimen nor administered on a regular basis.  She will need help on a daily basis to assist with medication delivery and adherence.  -Seen in follow-up by psychiatry on 11/24 and 11/26, impression was neurocognitive disorder and unspecified anxiety disorder. Patient does not appear to understand severity of medical condition and is still lacking in capacity to make medical decisions and unable to take care of herself. -On 12/12, patient had left her room without informing staff and was found by police in the parking lot. She expressed multiple times that she did not want to live.  She was reevaluated by psychiatry on 12/12 and was not felt to be suicidal, but continued to lack capacity in making medical decisions. -please review CSW notesregarding pt's discharge planning--COMPLEX d/c planning -Transition of care team is currently working on establishing guardianship and placement to ensure safe discharge - 12/16: TOC update noted.  Per the guardian has been found.  Currently in the process of finding a facility willing to accept the patient.  Family has been updated by the case management team.  They are aware that once a facility is found we will be moving forward with the post discharge plan of care.  Chronic systolic CHF, LVEF 44-03%. -Continuecarvedilol, Aldactone,Entresto and ivabradine. -follows with [facility] cardiology  Family communication : CSW has been in touch with  stepdaughter --vicki, brother Sherren Mocha, APS/DSS is involved also Consults :Psych Discharge Disposition :TBD, pt has a challenging discharge planning pending guardianship, she is not able to safely care for herself.  TOC team has been working  on resolving this.  Per CM notes, patient may be able to leave the hospital today CODE STATUS: FULL DVT Prophylaxis :SCD    Subjective: Seen and examined No acute events overnight No new complaints Lying in bed, answers all questions appropriately  Objective: Vitals:   09/19/19 1214 09/19/19 2049 09/20/19 0512 09/20/19 0918  BP: 114/66 134/68 116/69 114/62  Pulse: 72 70 72 72  Resp: 20 17 18    Temp: 97.8 F (36.6 C) 97.7 F (36.5 C) 98.3 F (36.8 C)   TempSrc: Oral Oral Oral   SpO2: 100% 97% 95%   Weight:      Height:        Intake/Output Summary (Last 24 hours) at 09/20/2019 1436 Last data filed at 09/20/2019 0140 Gross per 24 hour  Intake 220 ml  Output --  Net 220 ml   Filed Weights   08/29/19 0359 08/30/19 0409 08/31/19 0446  Weight: 64.5 kg 64.8 kg 65 kg    Examination:   General exam: Alert, awake, oriented x 3 Respiratory system: Clear to auscultation. Respiratory effort normal. Cardiovascular system:RRR. No murmurs, rubs, gallops.    Data Reviewed: I have personally reviewed following labs and imaging studies  CBC: No results for input(s): WBC, NEUTROABS, HGB, HCT, MCV, PLT in the last 168 hours. Basic Metabolic Panel: No results for input(s): NA, K, CL, CO2, GLUCOSE, BUN, CREATININE, CALCIUM, MG, PHOS in the last 168 hours. GFR: CrCl cannot be calculated (Patient's most recent lab result is older than the maximum 21 days allowed.). Liver Function Tests: No results for input(s): AST, ALT, ALKPHOS, BILITOT, PROT, ALBUMIN in the last 168 hours. No results for input(s): LIPASE, AMYLASE in the last 168 hours. No results for input(s): AMMONIA in the last 168 hours. Coagulation Profile: No results for input(s): INR, PROTIME in the last 168 hours. Cardiac Enzymes: No results for input(s): CKTOTAL, CKMB, CKMBINDEX, TROPONINI in the last 168 hours. BNP (last 3 results) No results for input(s): PROBNP in the last 8760 hours. HbA1C: No results for  input(s): HGBA1C in the last 72 hours. CBG: Recent Labs  Lab 09/19/19 1654 09/19/19 2051 09/20/19 0745 09/20/19 0851 09/20/19 1201  GLUCAP 74 210* 296* 282* 286*   Lipid Profile: No results for input(s): CHOL, HDL, LDLCALC, TRIG, CHOLHDL, LDLDIRECT in the last 72 hours. Thyroid Function Tests: No results for input(s): TSH, T4TOTAL, FREET4, T3FREE, THYROIDAB in the last 72 hours. Anemia Panel: No results for input(s): VITAMINB12, FOLATE, FERRITIN, TIBC, IRON, RETICCTPCT in the last 72 hours. Sepsis Labs: No results for input(s): PROCALCITON, LATICACIDVEN in the last 168 hours.  Recent Results (from the past 240 hour(s))  SARS CORONAVIRUS 2 (TAT 6-24 HRS) Nasopharyngeal Nasopharyngeal Swab     Status: None   Collection Time: 09/18/19  6:31 PM   Specimen: Nasopharyngeal Swab  Result Value Ref Range Status   SARS Coronavirus 2 NEGATIVE NEGATIVE Final    Comment: (NOTE) SARS-CoV-2 target nucleic acids are NOT DETECTED. The SARS-CoV-2 RNA is generally detectable in upper and lower respiratory specimens during the acute phase of infection. Negative results do not preclude SARS-CoV-2 infection, do not rule out co-infections with other pathogens, and should not be used as the sole basis for treatment or other patient management decisions. Negative results must be combined with clinical  observations, patient history, and epidemiological information. The expected result is Negative. Fact Sheet for Patients: SugarRoll.be Fact Sheet for Healthcare Providers: https://www.woods-mathews.com/ This test is not yet approved or cleared by the [place] FDA and  has been authorized for detection and/or diagnosis of SARS-CoV-2 by FDA under an Emergency Use Authorization (EUA). This EUA will remain  in effect (meaning this test can be used) for the duration of the COVID-19 declaration under Section 56 4(b)(1) of the Act, 21 U.S.C. section  360bbb-3(b)(1), unless the authorization is terminated or revoked sooner. Performed at [facility], [place] [address], [place], [place] [address]          Radiology Studies: No results found.      Scheduled Meds: . aspirin EC  81 mg Oral Daily  . carvedilol  12.5 mg Oral BID  . clopidogrel  75 mg Oral Q breakfast  . FLUoxetine  20 mg Oral Daily  . insulin aspart  0-20 Units Subcutaneous TID WC  . insulin aspart  14 Units Subcutaneous TID WC  . insulin glargine  35 Units Subcutaneous BID  . ivabradine  5 mg Oral BID WC  . LORazepam  1 mg Intravenous Once  . nystatin ointment   Topical BID  . pantoprazole  40 mg Oral Daily  . sacubitril-valsartan  1 tablet Oral BID  . spironolactone  25 mg Oral Daily   Continuous Infusions:   LOS: 29 days    Time spent: 25 minutes    Sidney Ace, MD [facility] Pager [phone]  If 7PM-7AM, please contact night-coverage [email or web address] Password [email or web address] 09/20/2019, 2:36 PM

## 2019-09-21 LAB — GLUCOSE, CAPILLARY
Glucose-Capillary: 224 mg/dL — ABNORMAL HIGH (ref 70–99)
Glucose-Capillary: 305 mg/dL — ABNORMAL HIGH (ref 70–99)
Glucose-Capillary: 72 mg/dL (ref 70–99)
Glucose-Capillary: 249 mg/dL — ABNORMAL HIGH (ref 70–99)

## 2019-09-21 MED ORDER — INSULIN ASPART 100 UNIT/ML ~~LOC~~ SOLN
15.0000 [IU] | Freq: Three times a day (TID) | SUBCUTANEOUS | Status: DC
  Administered 2019-09-22 – 2019-09-23 (×5): 15 [IU] via SUBCUTANEOUS
  Filled 2019-09-21 (×6): qty 1

## 2019-09-21 NOTE — Progress Notes (Signed)
Consult was placed to IV Team for new iv site;  Pt not receiving anything now by iv;  Spoke w Valinda Party, RN and explained that an IV can be placed if it is needed, bu that it is a potential source of infection if not ;  She will page the Dr regarding need for iv on PRN basis;  Will restart another iv if necessary.

## 2019-09-21 NOTE — Progress Notes (Signed)
PROGRESS NOTE    Kristy [name]  VVO:[id number] DOB: Apr 13, 1951 DOA: 08/21/2019 PCP: Owens Loffler, MD   Brief Narrative:  Patient is a 68 year old female nurses with hospitalist for 27 days.  Currently pending guardianship as patient does not have capacity and is found to not have the ability to care for herself at home.  12/16: Patient sitting up in the room.  She is conversant.  She again expresses a desire to go home.  Sugars running in the mid to high 200s  12/17: Improve glycemic control but not at goal  12/18: Patient sitting up in chair.  Nursing working case management that the patient may be able to leave the hospital today.  Patient is aware and excited.  12/19: No changes. In bed.  Appears very comfortable Per Transition of care note, [employer] denied patient and she will need a payor prior to being taken to [facility]  12/20: No status changes.  Walking in the hallways.  Pending placement  Assessment & Plan:   Principal Problem:   Uncontrolled type 2 diabetes mellitus with stage 3 chronic kidney disease, with long-term current use of insulin (HCC) Active Problems:   Essential hypertension   LBBB (left bundle branch block)   Major depressive disorder, recurrent, in full remission ([place])   Coronary artery disease, non-occlusive   Chronic systolic CHF (congestive heart failure) (HCC)   NICM (nonischemic cardiomyopathy) (HCC)   Elevated troponin   Sepsis (HCC)   Acute metabolic encephalopathy   Thrombocytopenia (HCC)   Adjustment disorder with mixed disturbance of emotions and conduct  Diabetes mellitus type 2, uncontrolled, with CKD stage III -hemoglobin A1c 10.9 -Blood sugars have been very labile. - Improved control over interval - Continue Lantus 35U BID -Continue NovoLog 15 units 3 times daily  Anemia with drop in hemoglobin. -Stable hgb -GI recommended no further evaluation suggested. -Continueaspirin and Plavix -continue PPI  Acute metabolic  encephalopathy, cognitive decline, depression, lack of capacity.  -Per stepdaughter [name] patient often skips medication with non compliance.  She has had multiple admissions in the past 2 months for uncontrolled blood sugars and associated hyperosmolar state. -Per 2  Psychiatrist Drs Danella Sensing and Cristofano, patient not able to understand the details of her insulin regimen nor administered on a regular basis.  She will need help on a daily basis to assist with medication delivery and adherence.  -Seen in follow-up by psychiatry on 11/24 and 11/26, impression was neurocognitive disorder and unspecified anxiety disorder. Patient does not appear to understand severity of medical condition and is still lacking in capacity to make medical decisions and unable to take care of herself. -On 12/12, patient had left her room without informing staff and was found by police in the parking lot. She expressed multiple times that she did not want to live.  She was reevaluated by psychiatry on 12/12 and was not felt to be suicidal, but continued to lack capacity in making medical decisions. -please review CSW notesregarding pt's discharge planning--COMPLEX d/c planning -Transition of care team is currently working on establishing guardianship and placement to ensure safe discharge - 12/16: TOC update noted.  Per the guardian has been found.  Currently in the process of finding a facility willing to accept the patient.  Family has been updated by the case management team.  They are aware that once a facility is found we will be moving forward with the post discharge plan of care.  Chronic systolic CHF, LVEF 26-94%. -Continuecarvedilol, Aldactone,Entresto and ivabradine. -follows  with [facility] cardiology  Family communication : CSW has been in touch with stepdaughter --vicki, brother Sherren Mocha, APS/DSS is involved also Consults :Psych Discharge Disposition :TBD, pt has a challenging discharge planning pending guardianship,  she is not able to safely care for herself.  TOC team has been working on resolving this.  Per CM notes, patient may be able to leave the hospital today CODE STATUS: FULL DVT Prophylaxis :SCD    Subjective: Seen and examined No acute events overnight No new complaints Lying in bed, answers all questions appropriately  Objective: Vitals:   09/20/19 1933 09/20/19 2119 09/21/19 0534 09/21/19 1210  BP: (!) 112/58  121/66 (!) 113/53  Pulse: 68 66 74 60  Resp: 18  18 20   Temp: 98.4 F (36.9 C)  97.7 F (36.5 C) 98.1 F (36.7 C)  TempSrc: Oral  Oral   SpO2: 100% 99% 100% 97%  Weight:      Height:       No intake or output data in the 24 hours ending 09/21/19 1337 Filed Weights   08/29/19 0359 08/30/19 0409 08/31/19 0446  Weight: 64.5 kg 64.8 kg 65 kg    Examination:   General exam: Alert, awake, oriented x 3 Respiratory system: Clear to auscultation. Respiratory effort normal. Cardiovascular system:RRR. No murmurs, rubs, gallops.    Data Reviewed: I have personally reviewed following labs and imaging studies  CBC: No results for input(s): WBC, NEUTROABS, HGB, HCT, MCV, PLT in the last 168 hours. Basic Metabolic Panel: No results for input(s): NA, K, CL, CO2, GLUCOSE, BUN, CREATININE, CALCIUM, MG, PHOS in the last 168 hours. GFR: CrCl cannot be calculated (Patient's most recent lab result is older than the maximum 21 days allowed.). Liver Function Tests: No results for input(s): AST, ALT, ALKPHOS, BILITOT, PROT, ALBUMIN in the last 168 hours. No results for input(s): LIPASE, AMYLASE in the last 168 hours. No results for input(s): AMMONIA in the last 168 hours. Coagulation Profile: No results for input(s): INR, PROTIME in the last 168 hours. Cardiac Enzymes: No results for input(s): CKTOTAL, CKMB, CKMBINDEX, TROPONINI in the last 168 hours. BNP (last 3 results) No results for input(s): PROBNP in the last 8760 hours. HbA1C: No results for input(s): HGBA1C in the  last 72 hours. CBG: Recent Labs  Lab 09/20/19 1201 09/20/19 1650 09/20/19 2119 09/21/19 0816 09/21/19 1205  GLUCAP 286* 86 106* 224* 305*   Lipid Profile: No results for input(s): CHOL, HDL, LDLCALC, TRIG, CHOLHDL, LDLDIRECT in the last 72 hours. Thyroid Function Tests: No results for input(s): TSH, T4TOTAL, FREET4, T3FREE, THYROIDAB in the last 72 hours. Anemia Panel: No results for input(s): VITAMINB12, FOLATE, FERRITIN, TIBC, IRON, RETICCTPCT in the last 72 hours. Sepsis Labs: No results for input(s): PROCALCITON, LATICACIDVEN in the last 168 hours.  Recent Results (from the past 240 hour(s))  SARS CORONAVIRUS 2 (TAT 6-24 HRS) Nasopharyngeal Nasopharyngeal Swab     Status: None   Collection Time: 09/18/19  6:31 PM   Specimen: Nasopharyngeal Swab  Result Value Ref Range Status   SARS Coronavirus 2 NEGATIVE NEGATIVE Final    Comment: (NOTE) SARS-CoV-2 target nucleic acids are NOT DETECTED. The SARS-CoV-2 RNA is generally detectable in upper and lower respiratory specimens during the acute phase of infection. Negative results do not preclude SARS-CoV-2 infection, do not rule out co-infections with other pathogens, and should not be used as the sole basis for treatment or other patient management decisions. Negative results must be combined with clinical observations, patient history, and epidemiological  information. The expected result is Negative. Fact Sheet for Patients: SugarRoll.be Fact Sheet for Healthcare Providers: https://www.woods-mathews.com/ This test is not yet approved or cleared by the [place] FDA and  has been authorized for detection and/or diagnosis of SARS-CoV-2 by FDA under an Emergency Use Authorization (EUA). This EUA will remain  in effect (meaning this test can be used) for the duration of the COVID-19 declaration under Section 56 4(b)(1) of the Act, 21 U.S.C. section 360bbb-3(b)(1), unless the  authorization is terminated or revoked sooner. Performed at [facility], [place] [address], [place], [place] [address]          Radiology Studies: No results found.      Scheduled Meds: . aspirin EC  81 mg Oral Daily  . carvedilol  12.5 mg Oral BID  . clopidogrel  75 mg Oral Q breakfast  . FLUoxetine  20 mg Oral Daily  . insulin aspart  0-20 Units Subcutaneous TID WC  . insulin aspart  14 Units Subcutaneous TID WC  . insulin glargine  35 Units Subcutaneous BID  . ivabradine  5 mg Oral BID WC  . LORazepam  1 mg Intravenous Once  . nystatin ointment   Topical BID  . pantoprazole  40 mg Oral Daily  . sacubitril-valsartan  1 tablet Oral BID  . spironolactone  25 mg Oral Daily   Continuous Infusions:   LOS: 30 days    Time spent: 25 minutes    Sidney Ace, MD [facility] Pager [phone]  If 7PM-7AM, please contact night-coverage [email or web address] Password [email or web address] 09/21/2019, 1:37 PM

## 2019-09-22 LAB — GLUCOSE, CAPILLARY
Glucose-Capillary: 214 mg/dL — ABNORMAL HIGH (ref 70–99)
Glucose-Capillary: 235 mg/dL — ABNORMAL HIGH (ref 70–99)
Glucose-Capillary: 345 mg/dL — ABNORMAL HIGH (ref 70–99)
Glucose-Capillary: 92 mg/dL (ref 70–99)
Glucose-Capillary: 235 mg/dL — ABNORMAL HIGH (ref 70–99)

## 2019-09-22 MED ORDER — INSULIN GLARGINE 100 UNIT/ML ~~LOC~~ SOLN
38.0000 [IU] | Freq: Two times a day (BID) | SUBCUTANEOUS | Status: DC
  Administered 2019-09-22 – 2019-09-23 (×2): 38 [IU] via SUBCUTANEOUS
  Filled 2019-09-22 (×4): qty 0.38

## 2019-09-22 NOTE — TOC Progression Note (Signed)
Transition of Care [facility]) - Progression Note    Patient Details  Name: Kristy [name] MRN: [id number] Date of Birth: 09-Oct-1950  Transition of Care [facility]) CM/SW Contact  [name], [name] Phone Number: 09/22/2019, 5:10 PM  Clinical Narrative:  CSW was informed that [name] guardian at [facility], [phone] called stating that family is not pursuing guardianship anymore.  He was requesting a call back, CSW contacted [name] from [place], [phone] to inform her that [name] had called, [name] stated she will reach out to him to discuss situation.  CSW was also informed by [name] that [name] from [place] was supposed to send her some paperwork to be filled out for patient to be admitted to SNF, however she has not spoken to [name] yet.  1:00pm  CSW spoke to [name] at [facility], [phone] and she was informed that patient may have been approved by [employer], she stated she would call them and try to find out.  4:30pm  CSW attempted to contact [name] from [facility], [name] had to leave a message on voice mail, awaiting for call back.  5:00pm CSW spoke to [name], [phone], and [name] [phone] to inform them that CSW was waiting for an update from [name] at [facility] regarding insurance authorization and acceptance to SNF.    Expected Discharge Plan: [facility] Care Barriers to Discharge: Insurance Authorization, Continued Medical Work up  Expected Discharge Plan and Services Expected Discharge Plan: [name] Acute Care Choice: [place] Living arrangements for the past 2 months: Single Family Home                                       Social Determinants of Health (SDOH) Interventions    Readmission Risk Interventions Readmission Risk Prevention Plan 04/28/2019 04/27/2019  Post Dischage Appt - Complete  Medication Screening - Complete  Transportation Screening - Complete  PCP  or Specialist Appt within 5-7 Days Complete -  Home Care Screening Complete -  Medication Review (RN CM) Complete -  Some recent data might be hidden

## 2019-09-22 NOTE — Progress Notes (Signed)
Inpatient Diabetes Program Recommendations  AACE/ADA: New Consensus Statement on Inpatient Glycemic Control (2015)  Target Ranges:  Prepandial:   less than 140 mg/dL      Peak postprandial:   less than 180 mg/dL (1-2 hours)      Critically ill patients:  140 - 180 mg/dL   Results for JANANN, BOEVE (MRN [id number]) as of 09/22/2019 12:04  Ref. Range 09/21/2019 08:16 09/21/2019 12:05 09/21/2019 17:18 09/21/2019 20:54 09/22/2019 02:12 09/22/2019 07:59 09/22/2019 12:02  Glucose-Capillary Latest Ref Range: 70 - 99 mg/dL 224 (H)  Novolog 21 units  Lantus 35 units 305 (H)  Novolog 29 units 72 249 (H)     Lantus 35 units 214 (H) 235 (H)  Novolog 22 units  Lantus 35 units 345 (H)   Review of Glycemic Control  history:DM2 Outpatient Diabetes medications:Humulin R U5000-100 unitsQID (with meals and at bedtime)per SSI <200- 0 units 201-300- 20 units 300-400- 50 units 401-500- 90 units >500- 100 units Current orders for Inpatient glycemic control: Lantus 35 units BID, Novolog 15 units TID with meals, Novolog 0-20 units TID with meals  Inpatient Diabetes Program Recommendations:   Insulin - Basal: Please consider increasing Lantus to 40 units BID.  Thanks, Barnie Alderman, RN, MSN, CDE Diabetes Coordinator Inpatient Diabetes Program [phone] (Team Pager from 8am to 5pm)

## 2019-09-22 NOTE — Progress Notes (Signed)
PROGRESS NOTE    Kristy [name]  HKV:[id number] DOB: 1951/04/23 DOA: 08/21/2019 PCP: Owens Loffler, MD   Brief Narrative:   currently pending guardianship as patient does not have capacity and is found to not have the ability to care for herself at home.  12/16: Patient sitting up in the room.  She is conversant.  She again expresses a desire to go home.  Sugars running in the mid to high 200s  12/17: Improve glycemic control but not at goal  12/18: Patient sitting up in chair.  Nursing working case management that the patient may be able to leave the hospital today.  Patient is aware and excited.  12/19: No changes. In bed.  Appears very comfortable Per Transition of care note, [employer] denied patient and she will need a payor prior to being taken to [facility]  12/20: No status changes.  Walking in the hallways.  Pending placement  12/21: No acute status changes.  Sugars running a little high.  Received notification this morning that the patient's stepdaughter Jocelyn Lamer called administration and was upset regarding lack of communication between the treatment team, the transitional care team, and her.  Call was placed to the stepdaughter by the unit manager and myself.  All questions were answered to family satisfaction.  Assessment & Plan:   Principal Problem:   Uncontrolled type 2 diabetes mellitus with stage 3 chronic kidney disease, with long-term current use of insulin (HCC) Active Problems:   Essential hypertension   LBBB (left bundle branch block)   Major depressive disorder, recurrent, in full remission ([place])   Coronary artery disease, non-occlusive   Chronic systolic CHF (congestive heart failure) (HCC)   NICM (nonischemic cardiomyopathy) (HCC)   Elevated troponin   Sepsis (HCC)   Acute metabolic encephalopathy   Thrombocytopenia (HCC)   Adjustment disorder with mixed disturbance of emotions and conduct  Diabetes mellitus type 2, uncontrolled, with CKD stage  III -hemoglobin A1c 10.9 -Blood sugars have been very labile. - Improved control over interval - Increase Lantus to 38U BID -Continue NovoLog 15 units 3 times daily  Anemia with drop in hemoglobin. -Stable hgb -GI recommended no further evaluation suggested. -Continueaspirin and Plavix -continue PPI  Acute metabolic encephalopathy, cognitive decline, depression, lack of capacity.  -Per stepdaughter Kristy [name] patient often skips medication with non compliance.  She has had multiple admissions in the past 2 months for uncontrolled blood sugars and associated hyperosmolar state. -Per 2  Psychiatrist Drs Danella Sensing and Cristofano, patient not able to understand the details of her insulin regimen nor administered on a regular basis.  She will need help on a daily basis to assist with medication delivery and adherence.  -Seen in follow-up by psychiatry on 11/24 and 11/26, impression was neurocognitive disorder and unspecified anxiety disorder. Patient does not appear to understand severity of medical condition and is still lacking in capacity to make medical decisions and unable to take care of herself. -On 12/12, patient had left her room without informing staff and was found by police in the parking lot. She expressed multiple times that she did not want to live.  She was reevaluated by psychiatry on 12/12 and was not felt to be suicidal, but continued to lack capacity in making medical decisions. -please review CSW notesregarding pt's discharge planning--COMPLEX d/c planning -Transition of care team is currently working on establishing guardianship and placement to ensure safe discharge - 12/16: TOC update noted.  Per the guardian has been found.  Currently in the process  of finding a facility willing to accept the patient.  Family has been updated by the case management team.  They are aware that once a facility is found we will be moving forward with the post discharge plan of care.  Chronic  systolic CHF, LVEF 16-10%. -Continuecarvedilol, Aldactone,Entresto and ivabradine. -follows with [facility] cardiology  Family communication : CSW has been in touch with stepdaughter --vicki, brother Sherren Mocha, APS/DSS is involved also Consults :Psych Discharge Disposition :TBD, pt has a challenging discharge planning pending guardianship, she is not able to safely care for herself.  TOC team has been working on resolving this.   CODE STATUS: FULL DVT Prophylaxis :SCD    Subjective: Seen and examined No acute events overnight No new complaints Lying in bed, answers all questions appropriately  Objective: Vitals:   09/21/19 0534 09/21/19 1210 09/21/19 2032 09/22/19 1048  BP: 121/66 (!) 113/53 132/64 (!) 108/57  Pulse: 74 60 72 66  Resp: 18 20 18    Temp: 97.7 F (36.5 C) 98.1 F (36.7 C) 97.9 F (36.6 C)   TempSrc: Oral     SpO2: 100% 97% 98%   Weight:      Height:       No intake or output data in the 24 hours ending 09/22/19 1327 Filed Weights   08/29/19 0359 08/30/19 0409 08/31/19 0446  Weight: 64.5 kg 64.8 kg 65 kg    Examination:   General exam: Alert, awake, oriented x 3 Respiratory system: Clear to auscultation. Respiratory effort normal. Cardiovascular system:RRR. No murmurs, rubs, gallops.    Data Reviewed: I have personally reviewed following labs and imaging studies  CBC: No results for input(s): WBC, NEUTROABS, HGB, HCT, MCV, PLT in the last 168 hours. Basic Metabolic Panel: No results for input(s): NA, K, CL, CO2, GLUCOSE, BUN, CREATININE, CALCIUM, MG, PHOS in the last 168 hours. GFR: CrCl cannot be calculated (Patient's most recent lab result is older than the maximum 21 days allowed.). Liver Function Tests: No results for input(s): AST, ALT, ALKPHOS, BILITOT, PROT, ALBUMIN in the last 168 hours. No results for input(s): LIPASE, AMYLASE in the last 168 hours. No results for input(s): AMMONIA in the last 168 hours. Coagulation Profile: No results for  input(s): INR, PROTIME in the last 168 hours. Cardiac Enzymes: No results for input(s): CKTOTAL, CKMB, CKMBINDEX, TROPONINI in the last 168 hours. BNP (last 3 results) No results for input(s): PROBNP in the last 8760 hours. HbA1C: No results for input(s): HGBA1C in the last 72 hours. CBG: Recent Labs  Lab 09/21/19 1718 09/21/19 2054 09/22/19 0212 09/22/19 0759 09/22/19 1202  GLUCAP 72 249* 214* 235* 345*   Lipid Profile: No results for input(s): CHOL, HDL, LDLCALC, TRIG, CHOLHDL, LDLDIRECT in the last 72 hours. Thyroid Function Tests: No results for input(s): TSH, T4TOTAL, FREET4, T3FREE, THYROIDAB in the last 72 hours. Anemia Panel: No results for input(s): VITAMINB12, FOLATE, FERRITIN, TIBC, IRON, RETICCTPCT in the last 72 hours. Sepsis Labs: No results for input(s): PROCALCITON, LATICACIDVEN in the last 168 hours.  Recent Results (from the past 240 hour(s))  SARS CORONAVIRUS 2 (TAT 6-24 HRS) Nasopharyngeal Nasopharyngeal Swab     Status: None   Collection Time: 09/18/19  6:31 PM   Specimen: Nasopharyngeal Swab  Result Value Ref Range Status   SARS Coronavirus 2 NEGATIVE NEGATIVE Final    Comment: (NOTE) SARS-CoV-2 target nucleic acids are NOT DETECTED. The SARS-CoV-2 RNA is generally detectable in upper and lower respiratory specimens during the acute phase of infection. Negative results  do not preclude SARS-CoV-2 infection, do not rule out co-infections with other pathogens, and should not be used as the sole basis for treatment or other patient management decisions. Negative results must be combined with clinical observations, patient history, and epidemiological information. The expected result is Negative. Fact Sheet for Patients: SugarRoll.be Fact Sheet for Healthcare Providers: https://www.woods-mathews.com/ This test is not yet approved or cleared by the [place] FDA and  has been authorized for detection and/or  diagnosis of SARS-CoV-2 by FDA under an Emergency Use Authorization (EUA). This EUA will remain  in effect (meaning this test can be used) for the duration of the COVID-19 declaration under Section 56 4(b)(1) of the Act, 21 U.S.C. section 360bbb-3(b)(1), unless the authorization is terminated or revoked sooner. Performed at [facility], [place] [address] [place], [place] [address]          Radiology Studies: No results found.      Scheduled Meds: . aspirin EC  81 mg Oral Daily  . carvedilol  12.5 mg Oral BID  . clopidogrel  75 mg Oral Q breakfast  . FLUoxetine  20 mg Oral Daily  . insulin aspart  0-20 Units Subcutaneous TID WC  . insulin aspart  15 Units Subcutaneous TID WC  . insulin glargine  38 Units Subcutaneous BID  . ivabradine  5 mg Oral BID WC  . LORazepam  1 mg Intravenous Once  . nystatin ointment   Topical BID  . pantoprazole  40 mg Oral Daily  . sacubitril-valsartan  1 tablet Oral BID  . spironolactone  25 mg Oral Daily   Continuous Infusions:   LOS: 31 days    Time spent: 25 minutes    Sidney Ace, MD [facility] Pager [phone]  If 7PM-7AM, please contact night-coverage [email or web address] Password [email or web address] 09/22/2019, 1:27 PM

## 2019-09-23 DIAGNOSIS — D696 Thrombocytopenia, unspecified: Secondary | ICD-10-CM | POA: Diagnosis not present

## 2019-09-23 DIAGNOSIS — R69 Illness, unspecified: Secondary | ICD-10-CM | POA: Diagnosis not present

## 2019-09-23 DIAGNOSIS — I428 Other cardiomyopathies: Secondary | ICD-10-CM | POA: Diagnosis not present

## 2019-09-23 DIAGNOSIS — Z20828 Contact with and (suspected) exposure to other viral communicable diseases: Secondary | ICD-10-CM | POA: Diagnosis not present

## 2019-09-23 DIAGNOSIS — Z1383 Encounter for screening for respiratory disorder NEC: Secondary | ICD-10-CM | POA: Diagnosis not present

## 2019-09-23 DIAGNOSIS — E1122 Type 2 diabetes mellitus with diabetic chronic kidney disease: Secondary | ICD-10-CM | POA: Diagnosis not present

## 2019-09-23 DIAGNOSIS — R279 Unspecified lack of coordination: Secondary | ICD-10-CM | POA: Diagnosis not present

## 2019-09-23 DIAGNOSIS — I10 Essential (primary) hypertension: Secondary | ICD-10-CM | POA: Diagnosis not present

## 2019-09-23 DIAGNOSIS — I447 Left bundle-branch block, unspecified: Secondary | ICD-10-CM | POA: Diagnosis not present

## 2019-09-23 DIAGNOSIS — E1110 Type 2 diabetes mellitus with ketoacidosis without coma: Secondary | ICD-10-CM | POA: Diagnosis not present

## 2019-09-23 DIAGNOSIS — I5022 Chronic systolic (congestive) heart failure: Secondary | ICD-10-CM | POA: Diagnosis not present

## 2019-09-23 DIAGNOSIS — Z794 Long term (current) use of insulin: Secondary | ICD-10-CM | POA: Diagnosis not present

## 2019-09-23 DIAGNOSIS — Z743 Need for continuous supervision: Secondary | ICD-10-CM | POA: Diagnosis not present

## 2019-09-23 DIAGNOSIS — A419 Sepsis, unspecified organism: Secondary | ICD-10-CM | POA: Diagnosis not present

## 2019-09-23 DIAGNOSIS — N183 Chronic kidney disease, stage 3 unspecified: Secondary | ICD-10-CM | POA: Diagnosis not present

## 2019-09-23 DIAGNOSIS — I509 Heart failure, unspecified: Secondary | ICD-10-CM | POA: Diagnosis not present

## 2019-09-23 DIAGNOSIS — E1165 Type 2 diabetes mellitus with hyperglycemia: Secondary | ICD-10-CM | POA: Diagnosis not present

## 2019-09-23 LAB — GLUCOSE, CAPILLARY
Glucose-Capillary: 130 mg/dL — ABNORMAL HIGH (ref 70–99)
Glucose-Capillary: 262 mg/dL — ABNORMAL HIGH (ref 70–99)
Glucose-Capillary: 150 mg/dL — ABNORMAL HIGH (ref 70–99)
Glucose-Capillary: 200 mg/dL — ABNORMAL HIGH (ref 70–99)

## 2019-09-23 MED ORDER — INSULIN ASPART 100 UNIT/ML ~~LOC~~ SOLN: 0.0000 [IU] | Freq: Three times a day (TID) | SUBCUTANEOUS | 11 refills | Status: DC

## 2019-09-23 MED ORDER — INSULIN GLARGINE 100 UNIT/ML ~~LOC~~ SOLN: 38.0000 [IU] | Freq: Two times a day (BID) | SUBCUTANEOUS | 11 refills | Status: DC

## 2019-09-23 MED ORDER — INSULIN ASPART 100 UNIT/ML ~~LOC~~ SOLN: 15.0000 [IU] | Freq: Three times a day (TID) | SUBCUTANEOUS | 11 refills | Status: DC

## 2019-09-23 NOTE — Discharge Summary (Addendum)
Physician Discharge Summary  Kristy [name] RSW:[id number] DOB: 1950/12/23 DOA: 08/21/2019  PCP: Owens Loffler, MD  Admit date: 08/21/2019 Discharge date: 09/23/2019  Admitted From: Home Disposition:  SNF  Recommendations for Outpatient Follow-up:  1. Follow up with PCP in 1-2 weeks   Home Health:NO Equipment/Devices:None  Discharge Condition:Stable CODE STATUS:Full Diet recommendation: Carb modified  Brief/Interim Summary: Kristy [name] is a 68 y.o. female with medical history significant of nonischemic cardiomyopathy, LBBB, systolic congestive heart failure, Crohn's disease, chronic kidney disease stage III, type 2 diabetes with hx of DKA, hypertension who presents with altered mental status.  Patient is unable to provide history as she is confused and ill-appearing. Patient reportedly was brought in by EMS from home for altered mental status and increased blood glucose.  Blood glucose with EMS in the 600s and improved down to 331 post 500 cc normal saline with EMS.  She reportedly was actively vomiting at the time of EMS arrival.  She was noted by nursing to be alert and oriented only to self in the ED and was disoriented and pulling on her leads and IV lines. She was recently discharged on 08/13/2019 with DKA and reportedly was non-compliant with her medications.   currently pending guardianship as patient does not have capacity and is found to not have the ability to care for herself at home.  12/16: Patient sitting up in the room.  She is conversant.  She again expresses a desire to go home.  Sugars running in the mid to high 200s  12/17: Improve glycemic control but not at goal  12/18: Patient sitting up in chair.  Nursing working case management that the patient may be able to leave the hospital today.  Patient is aware and excited.  12/19: No changes. In bed.  Appears very comfortable Per Transition of care note, [employer] denied patient and she will need a payor  prior to being taken to [facility]  12/20: No status changes.  Walking in the hallways.  Pending placement  12/21: No acute status changes.  Sugars running a little high.  Received notification this morning that the patient's stepdaughter Jocelyn Lamer called administration and was upset regarding lack of communication between the treatment team, the transitional care team, and her.  Call was placed to the stepdaughter by the unit manager and myself.  All questions were answered to family satisfaction.  12/22: Received word from CM/TOC team that patient had payer source and could DC to SNF if APS cleared the discharge.  Patient medically stable to dc from hospital  Discharge Diagnoses:  Principal Problem:   Uncontrolled type 2 diabetes mellitus with stage 3 chronic kidney disease, with long-term current use of insulin (HCC) Active Problems:   Essential hypertension   LBBB (left bundle branch block)   Major depressive disorder, recurrent, in full remission ([place])   Coronary artery disease, non-occlusive   Chronic systolic CHF (congestive heart failure) (HCC)   NICM (nonischemic cardiomyopathy) (HCC)   Elevated troponin   Sepsis (HCC)   Acute metabolic encephalopathy   Thrombocytopenia (HCC)   Adjustment disorder with mixed disturbance of emotions and conduct  Diabetes mellitus type 2, uncontrolled, with CKD stage III -hemoglobin A1c 10.9 -Blood sugars have been very labile. - Improved control over interval - Increase Lantus to 38U BID -Continue NovoLog 15 units 3 times daily - Continue above regimen on discharge  Anemia with drop in hemoglobin. -Stable hgb -GI recommended no further evaluation suggested. -Continueaspirin and Plavix -continue PPI  Acute  metabolic encephalopathy, cognitive decline, depression, lack of capacity.  -Per stepdaughter [name] patient often skips medication with non compliance. She has had multiple admissions in the past 2 months for uncontrolled  blood sugars and associated hyperosmolar state. -Per 2 Psychiatrist Drs Danella Sensing and Cristofano, patient notable to understand the details of her insulin regimen nor administered on a regular basis.She willneed help on a daily basis to assist with medication delivery and adherence.  -Seen in follow-up by psychiatry on 11/24 and 11/26, impression was neurocognitive disorder and unspecified anxiety disorder. Patient does not appear to understand severity of medical condition and is still lacking in capacityto make medical decisions and unable to take care of herself. -On 12/12, patient had left her room without informing staff and was found by police in the parking lot. She expressed multiple times that she did not want to live. She was reevaluated by psychiatry on 12/12 and was not felt to be suicidal, but continued to lack capacity in making medical decisions. -please review CSW notesregarding pt's discharge planning--COMPLEX d/c planning -Transition of care team is currently working on establishing guardianship and placement to ensure safe discharge - 12/16: TOC update noted.  Per the guardian has been found.  Currently in the process of finding a facility willing to accept the patient.  Family has been updated by the case management team.  They are aware that once a facility is found we will be moving forward with the post discharge plan of care.  Chronic systolic CHF, LVEF 54-27%. -Continuecarvedilol, Aldactone,Entresto and ivabradine. -follows with [facility] cardiology  Discharge Instructions  Discharge Instructions    Diet - low sodium heart healthy   Complete by: As directed    Increase activity slowly   Complete by: As directed        Contact information for follow-up providers    Owens Loffler, MD. Go on 09/05/2019.   Specialty: Family Medicine Why: 11:45 Contact information: [place] [address] [phone]        Minna Merritts, MD. Go on  09/06/2019.   Specialty: Cardiology Why: 11:45am appointment Contact information: [place] [address] [phone]        Jonathon Bellows, MD In 2 weeks.   Specialty: Gastroenterology Contact information: [place] [address] [phone]            Contact information for after-discharge care    [facility] .   Service: Skilled Nursing Contact information: [place] [address] [phone]                 Allergies  Allergen Reactions  . Fish Oil Other (See Comments)    Gout  . Glimepiride Other (See Comments)    REACTION: hypoglycemia  . Guanfacine Hcl Other (See Comments)    REACTION: unspecified  . Rosiglitazone Other (See Comments)    CHF    Consultations:  GI  Psychiatry  ICU   Procedures/Studies: No results found.    Subjective: Seen and examined at the time of discharge No acute events overnight No new complaints Medically stable for discharge  Discharge Exam: Vitals:   09/23/19 0515 09/23/19 1203  BP: (!) 117/58 102/60  Pulse: 69 78  Resp: 16 18  Temp: (!) 97.4 F (36.3 C) 97.8 F (36.6 C)  SpO2: 98% 99%   Vitals:   09/22/19 1337 09/22/19 2035 09/23/19 0515 09/23/19 1203  BP: 121/63 136/64 (!) 117/58 102/60  Pulse: 68 66 69 78  Resp: 18 16 16 18   Temp: 97.9 F (36.6 C) (!) 96.6 F (35.9 C) (!) 97.4 F (36.3 C) 97.8 F (36.6 C)  TempSrc:      SpO2: 97% 97% 98% 99%  Weight:      Height:        General: Pt is alert, awake, not in acute distress Cardiovascular: RRR, S1/S2 +, no rubs, no gallops Respiratory: CTA bilaterally, no wheezing, no rhonchi Abdominal: Soft, NT, ND, bowel sounds + Extremities: no edema, no cyanosis    The results of significant diagnostics from this hospitalization (including imaging, microbiology, ancillary and laboratory) are listed below for reference.      Microbiology: Recent Results (from the past 240 hour(s))  SARS CORONAVIRUS 2 (TAT 6-24 HRS) Nasopharyngeal Nasopharyngeal Swab     Status: None   Collection Time: 09/18/19  6:31 PM   Specimen: Nasopharyngeal Swab  Result Value Ref Range Status   SARS Coronavirus 2 NEGATIVE NEGATIVE Final    Comment: (NOTE) SARS-CoV-2 target nucleic acids are NOT DETECTED. The SARS-CoV-2 RNA is generally detectable in upper and lower respiratory specimens during the acute phase of infection. Negative results do not preclude SARS-CoV-2 infection, do not rule out co-infections with other pathogens, and should not be used as the sole basis for treatment or other patient management decisions. Negative results must be combined with clinical observations, patient history, and epidemiological information. The expected result is Negative. Fact Sheet for Patients: SugarRoll.be Fact Sheet for Healthcare Providers: https://www.woods-mathews.com/ This test is not yet approved or cleared by the [place] FDA and  has been authorized for detection and/or diagnosis of SARS-CoV-2 by FDA under an Emergency Use Authorization (EUA). This EUA will remain  in effect (meaning this test can be used) for the duration of the COVID-19 declaration under Section 56 4(b)(1) of the Act, 21 U.S.C. section 360bbb-3(b)(1), unless the authorization is terminated or revoked sooner. Performed at [facility], [place] [address], [place], [place] [address]      Labs: BNP (last 3 results) Recent Labs    08/22/19 0926 08/24/19 0449  BNP 202.0* 086.5*   Basic Metabolic Panel: No results for input(s): NA, K, CL, CO2, GLUCOSE, BUN, CREATININE, CALCIUM, MG, PHOS in the last 168 hours. Liver Function Tests: No results for input(s): AST, ALT, ALKPHOS, BILITOT, PROT, ALBUMIN in the last 168 hours. No results for input(s): LIPASE, AMYLASE in the last 168 hours. No results for  input(s): AMMONIA in the last 168 hours. CBC: No results for input(s): WBC, NEUTROABS, HGB, HCT, MCV, PLT in the last 168 hours. Cardiac Enzymes: No results for input(s): CKTOTAL, CKMB, CKMBINDEX, TROPONINI in the last 168 hours. BNP: Invalid input(s): POCBNP CBG: Recent Labs  Lab 09/22/19 1202 09/22/19 1653 09/22/19 2041 09/23/19 0750 09/23/19 1210  GLUCAP 345* 92 235* 130* 262*   D-Dimer No results for input(s): DDIMER in the last 72 hours. Hgb A1c No results for input(s): HGBA1C in the last 72 hours. Lipid Profile No results for input(s): CHOL, HDL, LDLCALC, TRIG, CHOLHDL, LDLDIRECT in the last 72 hours. Thyroid function studies No results for input(s): TSH, T4TOTAL, T3FREE, THYROIDAB in the last 72 hours.  Invalid input(s): FREET3 Anemia work up No results for input(s): VITAMINB12, FOLATE, FERRITIN, TIBC, IRON, RETICCTPCT in the last 72 hours. Urinalysis    Component Value Date/Time   COLORURINE YELLOW (A) 08/21/2019 1730   APPEARANCEUR CLOUDY (A) 08/21/2019 1730  LABSPEC 1.026 08/21/2019 1730   PHURINE 5.0 08/21/2019 1730   GLUCOSEU >=500 (A) 08/21/2019 1730   HGBUR SMALL (A) 08/21/2019 1730   HGBUR large 03/22/2010 1533   BILIRUBINUR NEGATIVE 08/21/2019 1730   KETONESUR 5 (A) 08/21/2019 1730   PROTEINUR NEGATIVE 08/21/2019 1730   UROBILINOGEN 0.2 03/22/2010 1533   NITRITE NEGATIVE 08/21/2019 1730   LEUKOCYTESUR NEGATIVE 08/21/2019 1730   Sepsis Labs Invalid input(s): PROCALCITONIN,  WBC,  LACTICIDVEN Microbiology Recent Results (from the past 240 hour(s))  SARS CORONAVIRUS 2 (TAT 6-24 HRS) Nasopharyngeal Nasopharyngeal Swab     Status: None   Collection Time: 09/18/19  6:31 PM   Specimen: Nasopharyngeal Swab  Result Value Ref Range Status   SARS Coronavirus 2 NEGATIVE NEGATIVE Final    Comment: (NOTE) SARS-CoV-2 target nucleic acids are NOT DETECTED. The SARS-CoV-2 RNA is generally detectable in upper and lower respiratory specimens during the acute  phase of infection. Negative results do not preclude SARS-CoV-2 infection, do not rule out co-infections with other pathogens, and should not be used as the sole basis for treatment or other patient management decisions. Negative results must be combined with clinical observations, patient history, and epidemiological information. The expected result is Negative. Fact Sheet for Patients: SugarRoll.be Fact Sheet for Healthcare Providers: https://www.woods-mathews.com/ This test is not yet approved or cleared by the [place] FDA and  has been authorized for detection and/or diagnosis of SARS-CoV-2 by FDA under an Emergency Use Authorization (EUA). This EUA will remain  in effect (meaning this test can be used) for the duration of the COVID-19 declaration under Section 56 4(b)(1) of the Act, 21 U.S.C. section 360bbb-3(b)(1), unless the authorization is terminated or revoked sooner. Performed at [facility], [place] [address] [place], [place] [address]      Time coordinating discharge: Over 30 minutes  SIGNED:   Sidney Ace, MD  [facility] 09/23/2019, 3:07 PM Pager [phone]  If 7PM-7AM, please contact night-coverage [email or web address] Password [email or web address]

## 2019-09-23 NOTE — Progress Notes (Signed)
[facility] came to pick patient up to take to [facility] skilled nursing facility.  IV already removed, vitals taken, and discharge papers given to transport. Tolerated transport well.

## 2019-09-23 NOTE — Progress Notes (Signed)
Report called to Caryl Pina, Therapist, sports, at [facility].  Awaiting EMS transport.

## 2019-09-23 NOTE — TOC Transition Note (Signed)
Transition of Care [facility]) - CM/SW Discharge Note   Patient Details  Name: Kristy [name] MRN: [id number] Date of Birth: Sep 16, 1951  Transition of Care [facility]) CM/SW Contact:  Beverly Sessions, RN Phone Number: 09/23/2019, 3:56 PM   Clinical Narrative:    Patient to discharge [name] Today  Discharge info sent to [place] at [facility] in the hub.   Elane Fritz with APS notified and she has completed the paper work to sign patient into the facility  [name] updated  EMS packet on the chart.  Bedside RN notified    Final next level of care: Skilled Nursing Facility Barriers to Discharge: No Barriers Identified   Patient Goals and CMS Choice        Discharge Placement              Patient chooses bed at: [facility] Patient to be transferred to facility by: EMS Name of family member notified: Elane Fritz APS, Todd peeler POA, vickie Boehringer daughter Patient and family notified of of transfer: 09/23/19  Discharge Plan and Services     Post Acute Care Choice: [place]                               Social Determinants of Health (SDOH) Interventions     Readmission Risk Interventions Readmission Risk Prevention Plan 04/28/2019 04/27/2019  Post Dischage Appt - Complete  Medication Screening - Complete  Transportation Screening - Complete  PCP or Specialist Appt within 5-7 Days Complete -  Home Care Screening Complete -  Medication Review (RN CM) Complete -  Some recent data might be hidden

## 2019-09-23 NOTE — Progress Notes (Signed)
Called [facility] to check status of patient transport.  Notified that request had been transferred to [facility].  Called [facility] [phone]) and was informed that patient was next up once a truck comes in.

## 2019-09-23 NOTE — Care Management Important Message (Signed)
Important Message  Patient Details  Name: Kristy [name] MRN: [id number] Date of Birth: May 01, 1951   Medicare Important Message Given:  Other (see comment)  Tried calling the Guardian at [place], [name] but wrong number listed in [facility] note and CSW did not have another number. I called [name] with [employer] to try to obtain the correct phone number but had to leave a message. Awaiting a call back.    Juliann Pulse A Gracelynn Bircher 09/23/2019, 12:28 PM

## 2019-09-25 ENCOUNTER — Other Ambulatory Visit: Payer: Self-pay

## 2019-09-25 NOTE — Patient Outreach (Signed)
[facility] [place]) Care Management  09/25/2019  Kristy [name] Nov 20, 1950 [id number]   [facility] Social Work case closure due to transition to long term care.  Ronn Melena, BSW Social Worker [phone]

## 2019-10-02 DIAGNOSIS — A419 Sepsis, unspecified organism: Secondary | ICD-10-CM | POA: Diagnosis not present

## 2019-10-02 DIAGNOSIS — E1165 Type 2 diabetes mellitus with hyperglycemia: Secondary | ICD-10-CM | POA: Diagnosis not present

## 2019-10-02 DIAGNOSIS — E1110 Type 2 diabetes mellitus with ketoacidosis without coma: Secondary | ICD-10-CM | POA: Diagnosis not present

## 2019-10-02 DIAGNOSIS — I509 Heart failure, unspecified: Secondary | ICD-10-CM | POA: Diagnosis not present

## 2019-10-04 NOTE — Progress Notes (Deleted)
Office Visit    Patient Name: Kristy [name] Date of Encounter: 10/04/2019  Primary Care Provider:  Owens Loffler, MD Primary Cardiologist:  Ida Rogue, MD  Chief Complaint    69 year old female with history of nonobstructive CAD, HFrEF (EF 34-40%, 04/2019) secondary to nonischemic cardiomyopathy, uncontrolled diabetes with prior DKA and poor medication / insulin compliance, Crohn's disease, hypertension, hyperlipidemia, known LBBB, prior tobacco abuse, gout, GERD, and who presents for follow-up s/p hospitalization 11/9-12/22 for uncontrolled diabetes.  Past Medical History    Past Medical History:  Diagnosis Date   Allergic rhinitis    Allergy    Cataract    mild    Crohn's disease ([place]) 10/13/2013   Diverticulosis    GERD (gastroesophageal reflux disease)    Gout    HFrEF (heart failure with reduced ejection fraction) ([place])    a. 12/2008 Cath: EF 45% w/ inf HK; b. 07/2009 Echo: EF 50-55%; c. 08/2018 Echo: EF 20-25%.   Hyperlipidemia    Hypertension    IBS (irritable bowel syndrome)    Left bundle branch block    Neuromuscular disorder (HCC)    neuropathy   NICM (nonischemic cardiomyopathy) ([place])    a. 12/2008 Cath: no significant dzs, EF 45% w/ inf HK->Med Rx; b. 07/2009 Echo: EF 50-55%; c. 08/2018 Echo: EF 20-25%, ant/antsept HK, mild MR, mildly dil LA, nl RV fx; d. 08/2018 Cath: D1 80, otw nonobs dzs->Med rx.   Non-obstructive CAD (coronary artery disease)    a. 12/2008 Cath: no significant dzs, EF 45% w/ inf HK->Med Rx; b. 08/2018 Cath: LM nl, LAD min irregs, D1 80, LCX 56md, RCA min irregs->Med Rx.   Osteoarthritis    Poorly controlled Diabetes mellitus    a. 07/2018 A1c 13.1.   Symptomatic cholelithiasis    Uncontrolled type 2 diabetes mellitus with stage 3 chronic kidney disease, with long-term current use of insulin ([facility]         Past Surgical History:  Procedure Laterality Date   BREAST BIOPSY Right 06/24/2019   stereo bx/ x clip/  path pending   BREAST CYST ASPIRATION     COLONOSCOPY     DILATION AND CURETTAGE OF UTERUS     LEFT HEART CATH AND CORONARY ANGIOGRAPHY N/A 04/24/2019   Procedure: LEFT HEART CATH AND CORONARY ANGIOGRAPHY;  Surgeon: ENelva Bush MD;  [facility];  Service: Cardiovascular;  Laterality: N/A;   RIGHT/LEFT HEART CATH AND CORONARY ANGIOGRAPHY N/A 08/26/2018   Procedure: RIGHT/LEFT HEART CATH AND CORONARY ANGIOGRAPHY;  Surgeon: AWellington Hampshire MD;  Location: [facility];  Service: Cardiovascular;  Laterality: N/A;   SHOULDER SURGERY     15 + yrs ago    SKIN SURGERY     nose    VAGINAL HYSTERECTOMY      Allergies  Allergies  Allergen Reactions   Fish Oil Other (See Comments)    Gout   Glimepiride Other (See Comments)    REACTION: hypoglycemia   Guanfacine Hcl Other (See Comments)    REACTION: unspecified   Rosiglitazone Other (See Comments)    CHF    History of Present Illness    69yo female with PMH as above and initially diagnosed with NICM 2010 with EF 45%. Diagnostic cath showed minimal and nonobstructive CAD with recommendation for medical mgmt. 2010 EF 50-55%. She has poorly controlled diabetes with admissions for DKA and AKI in the past requiring intermittent cessation of heart failure therapy. After an 08/2018 echo showed  EF 20-25%, she underwent diagnostic cath that showed again nonobstructive dz with more significant stenosis in a small diag and medical therapy continued. Repeat 12/2018 echo showed EF 50-55%. She was admitted 04/2019 with AMS and difficulty with word finding after a fall in the bathtub and found to have NSTEMI. Subsequent echo showed EF 35-40% and severe hypokinesis of the mid apical anterior wall and anteroseptal wall. Imaging of the head/brain without acute findings. She underwent diagnostic cath which showed stable appearance of cores since 08/2018 and without new lesions to explain her decline in LVEF or elevated  troponin. It was felt her elevated troponin was thus 2/2 supply demand ischemia and stress-induced cardiomyopathy in the setting of severe DKA. She was continued on evidence-based heart failure therapy. She was last seen by her primary cardiologist 06/11/2019 and felt to be doing well from a cardiac standpoint with glycemic control and medication compliance stressed. She was admitted to [facility] 08/21/2019-09/23/2019 with AMS and diagnosed with acute metabolic encephalopathy with cognitive decline and depression. Insulin compliance again stressed.   Home Medications    Prior to Admission medications   Medication Sig Start Date End Date Taking? Authorizing Provider  acetaminophen (TYLENOL) 325 MG tablet Take 325-650 mg by mouth daily as needed for moderate pain or headache.     [provider]  aspirin EC 81 MG EC tablet Take 1 tablet (81 mg total) by mouth daily. 04/29/19   Hillary Bow, MD  carvedilol (COREG) 12.5 MG tablet Take 1 tablet (12.5 mg total) by mouth 2 (two) times daily. 06/11/19 09/09/19  Minna Merritts, MD  clopidogrel (PLAVIX) 75 MG tablet Take 1 tablet (75 mg total) by mouth daily with breakfast. 06/11/19   Gollan, Kathlene November, MD  Ensure (ENSURE) Take 237 mLs by mouth daily.    [provider]  ENTRESTO 49-51 MG TAKE ONE TABLET TWICE DAILY STARTING SUNDAY 09/15/18 Patient taki-ng differently: Take 1 tablet by mouth 2 (two) times daily.  05/05/19   Minna Merritts, MD  FLUoxetine (PROZAC) 20 MG capsule Take 1 capsule (20 mg total) by mouth daily. 02/12/19   Copland, [name], MD  glucose blood (ONETOUCH ULTRA) test strip Use to check blood sugar up to 8 times a day. 06/25/19   Copland, [name], MD  insulin aspart (NOVOLOG) 100 UNIT/ML injection Inject 15 Units into the skin 3 (three) times daily with meals. 09/23/19   Sidney Ace, MD  insulin aspart (NOVOLOG) 100 UNIT/ML injection Inject 0-20 Units into the skin 3 (three) times daily with meals. 09/23/19   Ralene Muskrat B, MD  insulin glargine (LANTUS) 100 UNIT/ML injection Inject 0.38 mLs (38 Units total) into the skin 2 (two) times daily. 09/23/19   Sidney Ace, MD  Insulin Syringe-Needle U-100 (B-D INS SYRINGE 0.5CC/31GX5/16) 31G X 5/16" 0.5 ML MISC USE AS DIRECTED THREE TIMES A DAY 08/06/17   Copland, [name], MD  ivabradine (CORLANOR) 5 MG TABS tablet TAKE ONE TABLET TWICE DAILY WITH MEALS 08/11/19   Theora Gianotti, NP  pantoprazole (PROTONIX) 40 MG tablet Take 1 tablet (40 mg total) by mouth daily. Patient not taking: Reported on 08/18/2019 08/16/19   Loletha Grayer, MD  spironolactone (ALDACTONE) 25 MG tablet Take 1 tablet (25 mg total) by mouth daily. 08/04/19   Minna Merritts, MD    Review of Systems    ***.  All other systems reviewed and are otherwise negative except as noted above.  Physical Exam    VS:  There were  no vitals taken for this visit. , BMI There is no height or weight on file to calculate BMI. GEN: Well nourished, well developed, in no acute distress. HEENT: normal. Neck: Supple, no JVD, carotid bruits, or masses. Cardiac: RRR, no murmurs, rubs, or gallops. No clubbing, cyanosis, edema.  Radials/DP/PT 2+ and equal bilaterally.  Respiratory:  Respirations regular and unlabored, clear to auscultation bilaterally. GI: Soft, nontender, nondistended, BS + x 4. MS: no deformity or atrophy. Skin: warm and dry, no rash. Neuro:  Strength and sensation are intact. Psych: Normal affect.  Accessory Clinical Findings    ECG personally reviewed by me today - *** - no acute changes.  There were no vitals filed for this visit.   [facility] 04/23/3029 Conclusions: 1. Stable appearance of coronary arteries since 08/2018 without new lesion to explain recent decline in LVEF or troponin elevation.  I suspect elevated troponin may be due to supply-demand mismatch and/or stress-induced cardiomyopathy in the setting of severe DKA. 2. Focal D1 stenosis remains ~80%.  There  is mild to moderate, non-obstructive disease involving the LAD and LCx. 3. Mildly elevated left ventricular filling pressure. Recommendations: 1. Continue optimization of evidence-based heart failure.  Will continue carvedilol 3.125 mg BID and restart Entresto today. 2. Maintain net even fluid balance; could consider started gentle diuresis tomorrow as renal function allows. 3. Dual antiplatelet therapy with aspirin and clopidogrel for 12 months. 4. Aggressive secondary prevention  Echo 04/21/2019  1. Severe hypokinesis of the left ventricular, mid-apical anterior wall and anteroseptal wall.  2. The left ventricle has moderately reduced systolic function, with an ejection fraction of 35-40%. The cavity size was normal. Left ventricular diastolic function could not be evaluated.  3. The right ventricle has normal systolic function. The cavity was normal. There is mildly increased right ventricular wall thickness.  4. Left atrial size was not well visualized.  5. The aortic valve is tricuspid. Mild thickening of the aortic valve. Aortic valve regurgitation was not assessed by color flow Doppler. Mild aortic annular calcification noted.  6. The mitral valve was not well visualized. There is mild mitral annular calcification present.  7. The aortic root is normal in size and structure.  8. The interatrial septum was not well visualized.  Recent labs: 07/2019: Hemoglobin A1c 10.9 08/07/2019: TSH 1.325 08/28/2019: Sodium 142, potassium 3.5, glucose 277, creatinine 0.71, BUN 14 08/29/2019: WBC 10.0, hemoglobin 11.1, hematocrit 33.4, platelets 137  Assessment & Plan    1.  ***   Arvil Chaco, PA-C 10/04/2019, 3:42 PM

## 2019-10-06 ENCOUNTER — Ambulatory Visit: Payer: Medicare HMO | Admitting: Family Medicine

## 2019-10-07 ENCOUNTER — Ambulatory Visit: Payer: Medicare HMO | Admitting: Physician Assistant

## 2019-10-08 ENCOUNTER — Encounter: Payer: Self-pay | Admitting: Cardiovascular Disease

## 2019-10-12 DIAGNOSIS — Z1383 Encounter for screening for respiratory disorder NEC: Secondary | ICD-10-CM | POA: Diagnosis not present

## 2019-10-12 DIAGNOSIS — Z20828 Contact with and (suspected) exposure to other viral communicable diseases: Secondary | ICD-10-CM | POA: Diagnosis not present

## 2019-10-13 ENCOUNTER — Other Ambulatory Visit: Payer: Self-pay | Admitting: *Deleted

## 2019-10-13 NOTE — Patient Outreach (Signed)
Case closed, pt hospitalized greater than 10 days and transferred to skilled nursing facility.  RN CM faxed case closure to primary MD.  Jacqlyn Larsen [facility], BSN [place] Coordinator [phone]

## 2019-10-16 DIAGNOSIS — E1165 Type 2 diabetes mellitus with hyperglycemia: Secondary | ICD-10-CM | POA: Diagnosis not present

## 2019-10-16 DIAGNOSIS — E1110 Type 2 diabetes mellitus with ketoacidosis without coma: Secondary | ICD-10-CM | POA: Diagnosis not present

## 2019-10-16 DIAGNOSIS — A419 Sepsis, unspecified organism: Secondary | ICD-10-CM | POA: Diagnosis not present

## 2019-10-16 DIAGNOSIS — I509 Heart failure, unspecified: Secondary | ICD-10-CM | POA: Diagnosis not present

## 2019-10-19 DIAGNOSIS — Z1383 Encounter for screening for respiratory disorder NEC: Secondary | ICD-10-CM | POA: Diagnosis not present

## 2019-10-19 DIAGNOSIS — Z20828 Contact with and (suspected) exposure to other viral communicable diseases: Secondary | ICD-10-CM | POA: Diagnosis not present

## 2019-10-26 DIAGNOSIS — Z20828 Contact with and (suspected) exposure to other viral communicable diseases: Secondary | ICD-10-CM | POA: Diagnosis not present

## 2019-11-02 DIAGNOSIS — Z20828 Contact with and (suspected) exposure to other viral communicable diseases: Secondary | ICD-10-CM | POA: Diagnosis not present

## 2019-11-02 DIAGNOSIS — Z1383 Encounter for screening for respiratory disorder NEC: Secondary | ICD-10-CM | POA: Diagnosis not present

## 2019-11-09 DIAGNOSIS — Z20828 Contact with and (suspected) exposure to other viral communicable diseases: Secondary | ICD-10-CM | POA: Diagnosis not present

## 2019-11-09 DIAGNOSIS — Z1383 Encounter for screening for respiratory disorder NEC: Secondary | ICD-10-CM | POA: Diagnosis not present

## 2019-11-13 ENCOUNTER — Encounter: Payer: Self-pay | Admitting: Gastroenterology

## 2019-11-13 ENCOUNTER — Ambulatory Visit: Payer: Medicare HMO | Admitting: Gastroenterology

## 2019-11-13 DIAGNOSIS — A419 Sepsis, unspecified organism: Secondary | ICD-10-CM | POA: Diagnosis not present

## 2019-11-13 DIAGNOSIS — E1110 Type 2 diabetes mellitus with ketoacidosis without coma: Secondary | ICD-10-CM | POA: Diagnosis not present

## 2019-11-13 DIAGNOSIS — I509 Heart failure, unspecified: Secondary | ICD-10-CM | POA: Diagnosis not present

## 2019-11-13 DIAGNOSIS — E1165 Type 2 diabetes mellitus with hyperglycemia: Secondary | ICD-10-CM | POA: Diagnosis not present

## 2019-11-18 DIAGNOSIS — Z20828 Contact with and (suspected) exposure to other viral communicable diseases: Secondary | ICD-10-CM | POA: Diagnosis not present

## 2019-11-24 DIAGNOSIS — Z1383 Encounter for screening for respiratory disorder NEC: Secondary | ICD-10-CM | POA: Diagnosis not present

## 2019-11-24 DIAGNOSIS — Z20828 Contact with and (suspected) exposure to other viral communicable diseases: Secondary | ICD-10-CM | POA: Diagnosis not present

## 2019-11-27 ENCOUNTER — Ambulatory Visit: Payer: Medicare HMO | Admitting: Family Medicine

## 2019-12-08 ENCOUNTER — Ambulatory Visit (INDEPENDENT_AMBULATORY_CARE_PROVIDER_SITE_OTHER): Payer: Medicare HMO | Admitting: Family Medicine

## 2019-12-08 ENCOUNTER — Encounter: Payer: Self-pay | Admitting: Family Medicine

## 2019-12-08 ENCOUNTER — Other Ambulatory Visit: Payer: Self-pay

## 2019-12-08 VITALS — BP 120/60 | HR 91 | Temp 98.2°F | Ht 62.75 in | Wt 147.0 lb

## 2019-12-08 DIAGNOSIS — E1165 Type 2 diabetes mellitus with hyperglycemia: Secondary | ICD-10-CM

## 2019-12-08 DIAGNOSIS — Z794 Long term (current) use of insulin: Secondary | ICD-10-CM

## 2019-12-08 DIAGNOSIS — G9340 Encephalopathy, unspecified: Secondary | ICD-10-CM | POA: Diagnosis not present

## 2019-12-08 DIAGNOSIS — I2510 Atherosclerotic heart disease of native coronary artery without angina pectoris: Secondary | ICD-10-CM

## 2019-12-08 DIAGNOSIS — N183 Chronic kidney disease, stage 3 unspecified: Secondary | ICD-10-CM

## 2019-12-08 DIAGNOSIS — Z20822 Contact with and (suspected) exposure to covid-19: Secondary | ICD-10-CM | POA: Diagnosis not present

## 2019-12-08 DIAGNOSIS — E1122 Type 2 diabetes mellitus with diabetic chronic kidney disease: Secondary | ICD-10-CM

## 2019-12-08 DIAGNOSIS — IMO0002 Reserved for concepts with insufficient information to code with codable children: Secondary | ICD-10-CM

## 2019-12-08 DIAGNOSIS — I5022 Chronic systolic (congestive) heart failure: Secondary | ICD-10-CM | POA: Diagnosis not present

## 2019-12-08 NOTE — Progress Notes (Signed)
Kristy Tiegs T. Loreena Valeri, MD Primary Care and [place] at [facility] [place], [address] Phone: [phone]  FAX: [place] - 69 y.o. female  MRN [id number]  Date of Birth: 05-Oct-1950  Visit Date: 12/08/2019  PCP: Owens Loffler, MD  Referred by: Owens Loffler, MD  Chief Complaint  Patient presents with  . Follow-up    Discharge for [facility]    This visit occurred during the SARS-CoV-2 public health emergency.  Safety protocols were in place, including screening questions prior to the visit, additional usage of staff PPE, and extensive cleaning of exam room while observing appropriate contact time as indicated for disinfecting solutions.   Subjective:   Kristy [name] is a 69 y.o. very pleasant female patient who presents with the following:  Very complicated medical case s/p discharge from [facility].  Multiple admissions for exceptionally high BS, noncompliance with insulin use per the medical record.  We had a significant discussion about this today, and she tells me that she did not and has not missed any doses of her insulin.  She did vomit and had some other issues that would increase her blood sugar levels.  ICU admissions.  These were secondary to acute encephalopathy secondary diabetic ketoacidosis or honk.  She also has NICM.  She brings all of her medications into the office today, and she is compliant.  Lab Results  Component Value Date   HGBA1C 10.9 (H) 07/08/2019    Balance and strength improved.  The last time I saw her she was using a walker, now she looks much better and her balance is better as well as her strength.    Review of Systems is noted in the HPI, as appropriate  Objective:   BP 120/60   Pulse 91   Temp 98.2 F (36.8 C) (Temporal)   Ht 5' 2.75" (1.594 m)   Wt 147 lb (66.7 kg)   SpO2 97%   BMI 26.25 kg/m   GEN: WDWN, NAD, Non-toxic HEENT: Atraumatic,  Normocephalic. Neck supple. No masses. CV: RRR, No M/G/R. No JVD. No thrill. No extra heart sounds. PULM: CTA B, no wheezes, crackles, rhonchi. No retractions. No resp. distress. No accessory muscle use. EXTR: No c/c/e NEURO Normal gait.  PSYCH: Normally interactive. Conversant.   Laboratory and Imaging Data:  Assessment and Plan:     ICD-10-CM   1. Acute encephalopathy  G93.40   2. Uncontrolled type 2 diabetes mellitus with stage 3 chronic kidney disease, with long-term current use of insulin (HCC)  H96.22 Basic metabolic panel   W97.98 CBC with Differential/Platelet   N18.30 Hepatic function panel   Z79.4 Hemoglobin A1c  3. Chronic systolic CHF (congestive heart failure) (HCC)  X21.19 Basic metabolic panel    CBC with Differential/Platelet    Hepatic function panel    Hemoglobin A1c  4. Coronary artery disease, non-occlusive  I25.10   5. Close exposure to COVID-19 virus  Z20.822 SARS CoV2 Serology(COVID19) AB(IgG,IgM),Immunoassay   Exceptionally brittle diabetes.  20 units of Lantus had been added in the hospital in addition to her U500 insulin.  She reports to me that her blood sugars are doing quite a bit better compared to how they have been typically, but blood sugars it typically range from about 1 50-300.  Encephalopathy has resolved.  Compliant with medications.  Check for COVID-19 antibodies given close exposure in the skilled nursing facility.  Follow-up: Return in about 3 months (  around 03/09/2020).  No orders of the defined types were placed in this encounter.  Medications Discontinued During This Encounter  Medication Reason  . insulin aspart (NOVOLOG) 100 UNIT/ML injection Completed Course  . insulin aspart (NOVOLOG) 100 UNIT/ML injection Completed Course  . insulin glargine (LANTUS) 100 UNIT/ML injection Duplicate  . ENTRESTO 38-25 MG Duplicate   Orders Placed This Encounter  Procedures  . SARS CoV2 Serology(COVID19) AB(IgG,IgM),Immunoassay  . Basic  metabolic panel  . CBC with Differential/Platelet  . Hepatic function panel  . Hemoglobin A1c    Signed,  Denaya Horn T. Encarnacion Scioneaux, MD   Outpatient Encounter Medications as of 12/08/2019  Medication Sig  . acetaminophen (TYLENOL) 325 MG tablet Take 325-650 mg by mouth daily as needed for moderate pain or headache.   Marland Kitchen aspirin EC 81 MG EC tablet Take 1 tablet (81 mg total) by mouth daily.  . carvedilol (COREG) 12.5 MG tablet Take 1 tablet (12.5 mg total) by mouth 2 (two) times daily.  . clopidogrel (PLAVIX) 75 MG tablet Take 1 tablet (75 mg total) by mouth daily with breakfast.  . Ensure (ENSURE) Take 237 mLs by mouth daily.  Marland Kitchen FLUoxetine (PROZAC) 20 MG capsule Take 1 capsule (20 mg total) by mouth daily.  Marland Kitchen glucose blood (ONETOUCH ULTRA) test strip Use to check blood sugar up to 8 times a day.  . insulin glargine (LANTUS) 100 UNIT/ML injection Inject 20 Units into the skin 2 (two) times daily.  . insulin regular human CONCENTRATED (HUMULIN R) 500 UNIT/ML injection Inject 30 Units into the skin 3 (three) times daily with meals.  . Insulin Syringe-Needle U-100 (B-D INS SYRINGE 0.5CC/31GX5/16) 31G X 5/16" 0.5 ML MISC USE AS DIRECTED THREE TIMES A DAY  . ivabradine (CORLANOR) 5 MG TABS tablet TAKE ONE TABLET TWICE DAILY WITH MEALS  . pantoprazole (PROTONIX) 40 MG tablet Take 1 tablet (40 mg total) by mouth daily.  . sacubitril-valsartan (ENTRESTO) 49-51 MG Take 1 tablet by mouth 2 (two) times daily.  Marland Kitchen spironolactone (ALDACTONE) 25 MG tablet Take 1 tablet (25 mg total) by mouth daily.  . [DISCONTINUED] ENTRESTO 49-51 MG TAKE ONE TABLET TWICE DAILY STARTING SUNDAY 09/15/18 (Patient taking differently: Take 1 tablet by mouth 2 (two) times daily. )  . [DISCONTINUED] insulin glargine (LANTUS) 100 UNIT/ML injection Inject 0.38 mLs (38 Units total) into the skin 2 (two) times daily. (Patient taking differently: Inject 20 Units into the skin 2 (two) times daily. )  . [DISCONTINUED] insulin aspart (NOVOLOG)  100 UNIT/ML injection Inject 15 Units into the skin 3 (three) times daily with meals.  . [DISCONTINUED] insulin aspart (NOVOLOG) 100 UNIT/ML injection Inject 0-20 Units into the skin 3 (three) times daily with meals.   No facility-administered encounter medications on file as of 12/08/2019.

## 2019-12-09 LAB — CBC WITH DIFFERENTIAL/PLATELET
Basophils Absolute: 0.1 10*3/uL (ref 0.0–0.1)
Basophils Relative: 0.8 % (ref 0.0–3.0)
Eosinophils Absolute: 0.1 10*3/uL (ref 0.0–0.7)
Eosinophils Relative: 1.3 % (ref 0.0–5.0)
HCT: 40.4 % (ref 36.0–46.0)
Hemoglobin: 13 g/dL (ref 12.0–15.0)
Lymphocytes Relative: 41.1 % (ref 12.0–46.0)
Lymphs Abs: 4.5 10*3/uL — ABNORMAL HIGH (ref 0.7–4.0)
MCHC: 32.1 g/dL (ref 30.0–36.0)
MCV: 89.6 fl (ref 78.0–100.0)
Monocytes Absolute: 0.7 10*3/uL (ref 0.1–1.0)
Monocytes Relative: 6.9 % (ref 3.0–12.0)
Neutro Abs: 5.4 10*3/uL (ref 1.4–7.7)
Neutrophils Relative %: 49.9 % (ref 43.0–77.0)
Platelets: 196 10*3/uL (ref 150.0–400.0)
RBC: 4.51 Mil/uL (ref 3.87–5.11)
RDW: 14.8 % (ref 11.5–15.5)
WBC: 10.9 10*3/uL — ABNORMAL HIGH (ref 4.0–10.5)

## 2019-12-09 LAB — HEPATIC FUNCTION PANEL
ALT: 13 U/L (ref 0–35)
AST: 17 U/L (ref 0–37)
Albumin: 3.9 g/dL (ref 3.5–5.2)
Alkaline Phosphatase: 103 U/L (ref 39–117)
Bilirubin, Direct: 0.1 mg/dL (ref 0.0–0.3)
Total Bilirubin: 0.4 mg/dL (ref 0.2–1.2)
Total Protein: 6.8 g/dL (ref 6.0–8.3)

## 2019-12-09 LAB — BASIC METABOLIC PANEL
BUN: 22 mg/dL (ref 6–23)
CO2: 26 mEq/L (ref 19–32)
Calcium: 9.8 mg/dL (ref 8.4–10.5)
Chloride: 100 mEq/L (ref 96–112)
Creatinine, Ser: 1.15 mg/dL (ref 0.40–1.20)
GFR: 46.85 mL/min — ABNORMAL LOW (ref >60.00)
Glucose, Bld: 443 mg/dL — ABNORMAL HIGH (ref 70–99)
Potassium: 4.5 mEq/L (ref 3.5–5.1)
Sodium: 135 mEq/L (ref 135–145)

## 2019-12-09 LAB — HEMOGLOBIN A1C: Hgb A1c MFr Bld: 9.7 % — ABNORMAL HIGH (ref 4.6–6.5)

## 2019-12-09 LAB — SARS COV-2 SEROLOGY(COVID-19)AB(IGG,IGM),IMMUNOASSAY
SARS CoV-2 AB IgG: NEGATIVE
SARS CoV-2 IgM: NEGATIVE

## 2019-12-15 ENCOUNTER — Telehealth: Payer: Self-pay | Admitting: *Deleted

## 2019-12-15 NOTE — Telephone Encounter (Signed)
Received fax from [facility].  Lantus is non-formulary.  Preferred medication is Engineer, agricultural, [place] or Levemir.  Please advise.

## 2019-12-17 DIAGNOSIS — R69 Illness, unspecified: Secondary | ICD-10-CM | POA: Diagnosis not present

## 2019-12-17 MED ORDER — INSULIN DETEMIR 100 UNIT/ML ~~LOC~~ SOLN: 10.0000 [IU] | Freq: Two times a day (BID) | SUBCUTANEOUS | 3 refills | Status: DC

## 2019-12-17 NOTE — Telephone Encounter (Signed)
There is not a one-to-one he quality between Lantus and Levemir, but I am going to send in some Levemir for her at half of her prior Lantus dosing.  We can adjust this over time.    Dropped her from 20 units twice a day to 10 units twice a day.  We can go up on this if needed.  I do not want to do a direct conversion, because her risk of hypoglycemia is definitely there.

## 2019-12-17 NOTE — Telephone Encounter (Signed)
Kristy [name] notified as instructed by telephone.  Patient states understanding.  She also states that she has decreased her Humulin R to 30 units twice a day instead of three times a day.  Medication list updated.  FYI to Dr. Lorelei Pont.  She will get [place] posted on how her blood sugars are doing.

## 2019-12-17 NOTE — Telephone Encounter (Signed)
Called Kristy [name].  Voicemail is not set up so I was unable to leave a message.

## 2020-01-05 ENCOUNTER — Other Ambulatory Visit: Payer: Self-pay | Admitting: Family Medicine

## 2020-01-05 DIAGNOSIS — R69 Illness, unspecified: Secondary | ICD-10-CM | POA: Diagnosis not present

## 2020-01-05 NOTE — Telephone Encounter (Signed)
Last office visit 12/08/2019 for Follow up [facility] discharge.  Tramadol not on current medication list.

## 2020-01-13 ENCOUNTER — Telehealth: Payer: Self-pay | Admitting: Family Medicine

## 2020-01-13 NOTE — Progress Notes (Signed)
  Chronic Care Management   Outreach Note  01/13/2020 Name: Kristy [name] MRN: [id number] DOB: 01-16-1951  Referred by: Owens Loffler, MD Reason for referral : No chief complaint on file.   An unsuccessful telephone outreach was attempted today. The patient was referred to the pharmacist for assistance with care management and care coordination.   Follow Up Plan:   Raynicia Dukes [employer] Scheduler

## 2020-02-05 ENCOUNTER — Telehealth: Payer: Self-pay | Admitting: Family Medicine

## 2020-02-05 NOTE — Progress Notes (Signed)
  Chronic Care Management   Outreach Note  02/05/2020 Name: Kristy [name] MRN: [id number] DOB: [birth year]  Referred by: Owens Loffler, MD Reason for referral : No chief complaint on file.   A second unsuccessful telephone outreach was attempted today. The patient was referred to pharmacist for assistance with care management and care coordination.   This note is not being shared with the patient for the following reason: To respect privacy (The patient or proxy has requested that the information not be shared).  Follow Up Plan:   Raynicia Dukes [employer] Scheduler

## 2020-02-05 NOTE — Progress Notes (Signed)
  Chronic Care Management   Note  02/05/2020 Name: DORISE GANGI MRN: [id number] DOB: Sep 24, 1951  CALAH GERSHMAN is a 69 y.o. year old female who is a primary care patient of Copland, [name], MD. I reached out to [name] by phone today in response to a referral sent by Ms. Jeanann Lewandowsky Lacorte's PCP, Owens Loffler, MD.   Ms. Rowzee was given information about Chronic Care Management services today including:  1. CCM service includes personalized support from designated clinical staff supervised by her physician, including individualized plan of care and coordination with other care providers 2. 24/7 contact phone numbers for assistance for urgent and routine care needs. 3. Service will only be billed when office clinical staff spend 20 minutes or more in a month to coordinate care. 4. Only one practitioner may furnish and bill the service in a calendar month. 5. The patient may stop CCM services at any time (effective at the end of the month) by phone call to the office staff.   Patient agreed to services and verbal consent obtained.    This note is not being shared with the patient for the following reason: To respect privacy (The patient or proxy has requested that the information not be shared).  Follow up plan:   Raynicia Dukes [employer] Scheduler

## 2020-02-27 DIAGNOSIS — R69 Illness, unspecified: Secondary | ICD-10-CM | POA: Diagnosis not present

## 2020-03-10 ENCOUNTER — Other Ambulatory Visit: Payer: Self-pay

## 2020-03-10 ENCOUNTER — Ambulatory Visit (INDEPENDENT_AMBULATORY_CARE_PROVIDER_SITE_OTHER): Payer: Medicare HMO | Admitting: Family Medicine

## 2020-03-10 ENCOUNTER — Encounter: Payer: Self-pay | Admitting: Family Medicine

## 2020-03-10 VITALS — BP 104/60 | HR 82 | Temp 98.1°F | Ht 62.75 in | Wt 148.5 lb

## 2020-03-10 DIAGNOSIS — I2510 Atherosclerotic heart disease of native coronary artery without angina pectoris: Secondary | ICD-10-CM | POA: Diagnosis not present

## 2020-03-10 DIAGNOSIS — I5022 Chronic systolic (congestive) heart failure: Secondary | ICD-10-CM | POA: Diagnosis not present

## 2020-03-10 DIAGNOSIS — Z794 Long term (current) use of insulin: Secondary | ICD-10-CM | POA: Diagnosis not present

## 2020-03-10 DIAGNOSIS — N183 Chronic kidney disease, stage 3 unspecified: Secondary | ICD-10-CM

## 2020-03-10 DIAGNOSIS — I10 Essential (primary) hypertension: Secondary | ICD-10-CM

## 2020-03-10 DIAGNOSIS — E1122 Type 2 diabetes mellitus with diabetic chronic kidney disease: Secondary | ICD-10-CM | POA: Diagnosis not present

## 2020-03-10 DIAGNOSIS — E7800 Pure hypercholesterolemia, unspecified: Secondary | ICD-10-CM | POA: Diagnosis not present

## 2020-03-10 DIAGNOSIS — E1165 Type 2 diabetes mellitus with hyperglycemia: Secondary | ICD-10-CM | POA: Diagnosis not present

## 2020-03-10 DIAGNOSIS — IMO0002 Reserved for concepts with insufficient information to code with codable children: Secondary | ICD-10-CM

## 2020-03-10 LAB — POCT GLYCOSYLATED HEMOGLOBIN (HGB A1C): Hemoglobin A1C: 11.4 % — AB (ref 4.0–5.6)

## 2020-03-10 NOTE — Progress Notes (Signed)
Kristy Costilow T. Teodoro Jeffreys, MD, [facility] [place] at [facility] [place], [address]  Phone: [phone]  FAX: [place] - 69 y.o. female  MRN [id number]  Date of Birth: [birth year]  Date: 03/10/2020  PCP: Owens Loffler, MD  Referral: Owens Loffler, MD  Chief Complaint  Patient presents with  . Diabetes    This visit occurred during the SARS-CoV-2 public health emergency.  Safety protocols were in place, including screening questions prior to the visit, additional usage of staff PPE, and extensive cleaning of exam room while observing appropriate contact time as indicated for disinfecting solutions.   Subjective:   Kristy [name] is a 69 y.o. very pleasant female patient with Body mass index is 26.52 kg/m. who presents with the following:  Diabetes Mellitus: Tolerating Medications: yes Compliance with diet: fair, Body mass index is 26.52 kg/m. Exercise: minimal / intermittent Avg blood sugars at home: not checking Foot problems: none Hypoglycemia: none No nausea, vomitting, blurred vision, polyuria.  Long acting at 20 units 50 units twice a day  Checks BP > 4 times a day  HTN: Tolerating all medications without side effects Stable and at goal No CP, no sob. No HA.  BP Readings from Last 3 Encounters:  03/10/20 104/60  12/08/19 120/60  09/23/19 277/41    Basic Metabolic Panel:    Component Value Date/Time   NA 135 12/08/2019 1500   NA 135 10/16/2018 1035   K 4.5 12/08/2019 1500   CL 100 12/08/2019 1500   CO2 26 12/08/2019 1500   BUN 22 12/08/2019 1500   BUN 13 10/16/2018 1035   CREATININE 1.15 12/08/2019 1500   CREATININE 0.72 07/27/2014 0902   GLUCOSE 443 (H) 12/08/2019 1500   CALCIUM 9.8 12/08/2019 1500     Lab Results  Component Value Date   HGBA1C 11.4 (A) 03/10/2020   HGBA1C 9.7 (H) 12/08/2019   HGBA1C 10.9 (H) 07/08/2019   Lab  Results  Component Value Date   MICROALBUR 18.1 (H) 12/10/2018   LDLCALC 103 (H) 04/22/2019   CREATININE 1.15 12/08/2019    Wt Readings from Last 3 Encounters:  03/10/20 148 lb 8 oz (67.4 kg)  12/08/19 147 lb (66.7 kg)  08/31/19 143 lb 6.4 oz (65 kg)    Lipids: Doing well, stable. Tolerating meds fine with no SE. Panel reviewed with patient.  Lipids:    Component Value Date/Time   CHOL 170 04/22/2019 0700   TRIG 209 (H) 04/22/2019 0700   HDL 25 (L) 04/22/2019 0700   LDLDIRECT 192.0 07/11/2018 0846   VLDL 42 (H) 04/22/2019 0700   CHOLHDL 6.8 04/22/2019 0700    Lab Results  Component Value Date   ALT 13 12/08/2019   AST 17 12/08/2019   ALKPHOS 103 12/08/2019   BILITOT 0.4 12/08/2019     Review of Systems is noted in the HPI, as appropriate  Objective:   BP 104/60   Pulse 82   Temp 98.1 F (36.7 C) (Temporal)   Ht 5' 2.75" (1.594 m)   Wt 148 lb 8 oz (67.4 kg)   SpO2 96%   BMI 26.52 kg/m   GEN: No acute distress; alert,appropriate. PULM: Breathing comfortably in no respiratory distress PSYCH: Normally interactive.  CV: RRR, no m/g/r PULM: Normal respiratory rate, no accessory muscle use. No wheezes, crackles or rhonchi   Laboratory and Imaging Data: Lab Review:  CBC EXTENDED Latest Ref  Rng & Units 12/08/2019 08/29/2019 08/28/2019  WBC 4.0 - 10.5 K/uL 10.9(H) 10.0 11.6(H)  RBC 3.87 - 5.11 Mil/uL 4.51 3.88 4.08  HGB 12.0 - 15.0 g/dL 13.0 11.1(L) 11.7(L)  HCT 36 - 46 % 40.4 33.4(L) 37.1  PLT 150 - 400 K/uL 196.0 137(L) 147(L)  NEUTROABS 1.4 - 7.7 K/uL 5.4 - -  LYMPHSABS 0.7 - 4.0 K/uL 4.5(H) - -    BMP Latest Ref Rng & Units 12/08/2019 08/28/2019 08/27/2019  Glucose 70 - 99 mg/dL 443(H) 277(H) 256(H)  BUN 6 - 23 mg/dL 22 14 16   Creatinine 0.40 - 1.20 mg/dL 1.15 0.71 0.82  BUN/Creat Ratio 12 - 28 - - -  Sodium 135 - 145 mEq/L 135 142 140  Potassium 3.5 - 5.1 mEq/L 4.5 3.5 3.5  Chloride 96 - 112 mEq/L 100 110 109  CO2 19 - 32 mEq/L 26 24 23   Calcium 8.4  - 10.5 mg/dL 9.8 8.8(L) 8.9    Hepatic Function Latest Ref Rng & Units 12/08/2019 08/21/2019 08/04/2019  Total Protein 6.0 - 8.3 g/dL 6.8 8.3(H) 8.7(H)  Albumin 3.5 - 5.2 g/dL 3.9 3.5 3.8  AST 0 - 37 U/L 17 19 22   ALT 0 - 35 U/L 13 26 19   Alk Phosphatase 39 - 117 U/L 103 245(H) 151(H)  Total Bilirubin 0.2 - 1.2 mg/dL 0.4 1.3(H) 1.1  Bilirubin, Direct 0.0 - 0.3 mg/dL 0.1 - -    Lab Results  Component Value Date   CHOL 170 04/22/2019   Lab Results  Component Value Date   HDL 25 (L) 04/22/2019   Lab Results  Component Value Date   LDLCALC 103 (H) 04/22/2019   Lab Results  Component Value Date   TRIG 209 (H) 04/22/2019   Lab Results  Component Value Date   CHOLHDL 6.8 04/22/2019   No results for input(s): PSA in the last 72 hours. Lab Results  Component Value Date   HCVAB NEG 06/13/2010   No results found for: [facility]   Lab Results  Component Value Date   HGBA1C 11.4 (A) 03/10/2020   HGBA1C 9.7 (H) 12/08/2019   HGBA1C 10.9 (H) 07/08/2019   Lab Results  Component Value Date   MICROALBUR 18.1 (H) 12/10/2018   LDLCALC 103 (H) 04/22/2019   CREATININE 1.15 12/08/2019    Assessment and Plan:     ICD-10-CM   1. Chronic systolic CHF (congestive heart failure) (HCC)  I50.22   2. Uncontrolled type 2 diabetes mellitus with stage 3 chronic kidney disease, with long-term current use of insulin (HCC)  E11.22 POCT glycosylated hemoglobin (Hb A1C)   E11.65    N18.30    Z79.4   3. Coronary artery disease, non-occlusive  I25.10   4. Essential hypertension  I10   5. Pure hypercholesterolemia  E78.00    She continues to not do well with her diabetes.  She has seen many endocrinologist around the state, and stabilized.  She is currently on Levemir as well as U5 100.  She has been compliant with all of her heart medicine.  We are also following her renal function.  Blood pressures have been stable.  She declines taking statin medication.  Follow-up: Return in about 3  months (around 06/10/2020) for diabetes follow-up.  No orders of the defined types were placed in this encounter.  There are no discontinued medications. Orders Placed This Encounter  Procedures  . POCT glycosylated hemoglobin (Hb A1C)    Signed,  Kassia Demarinis T. Nyasia Baxley, MD   Outpatient Encounter  Medications as of 03/10/2020  Medication Sig  . acetaminophen (TYLENOL) 325 MG tablet Take 325-650 mg by mouth daily as needed for moderate pain or headache.   Marland Kitchen aspirin EC 81 MG EC tablet Take 1 tablet (81 mg total) by mouth daily.  . carvedilol (COREG) 12.5 MG tablet Take 1 tablet (12.5 mg total) by mouth 2 (two) times daily.  . chlorhexidine (PERIDEX) 0.12 % solution RINSE WITH 1 CAPFUL (15ML) FOR 30 SECONDS IN THE MORNING AND IN THE EVENING AFTER TOOTHBRUSHING DO NOT SWALLOW  . Ensure (ENSURE) Take 237 mLs by mouth daily.  Marland Kitchen FLUoxetine (PROZAC) 20 MG capsule Take 1 capsule (20 mg total) by mouth daily.  Marland Kitchen glucose blood (ONETOUCH ULTRA) test strip Use to check blood sugar up to 8 times a day.  . insulin detemir (LEVEMIR) 100 UNIT/ML injection Inject 0.1 mLs (10 Units total) into the skin 2 (two) times daily. (Patient taking differently: Inject 20 Units into the skin 2 (two) times daily. )  . insulin regular human CONCENTRATED (HUMULIN R) 500 UNIT/ML injection Inject 50 Units into the skin 2 (two) times daily with a meal.   . Insulin Syringe-Needle U-100 (B-D INS SYRINGE 0.5CC/31GX5/16) 31G X 5/16" 0.5 ML MISC USE AS DIRECTED THREE TIMES A DAY  . ivabradine (CORLANOR) 5 MG TABS tablet TAKE ONE TABLET TWICE DAILY WITH MEALS (Patient taking differently: TAKE ONE TABLET DAILY AS NEEDED)  . pantoprazole (PROTONIX) 40 MG tablet Take 1 tablet (40 mg total) by mouth daily.  . sacubitril-valsartan (ENTRESTO) 49-51 MG Take 1 tablet by mouth 2 (two) times daily.  Marland Kitchen spironolactone (ALDACTONE) 25 MG tablet Take 1 tablet (25 mg total) by mouth daily.  . traMADol (ULTRAM) 50 MG tablet TAKE ONE TABLET EVERY EIGHT  HOURS AS NEEDED FOR PAIN FOR UP TO 7 DAYS  . clopidogrel (PLAVIX) 75 MG tablet Take 1 tablet (75 mg total) by mouth daily with breakfast. (Patient not taking: Reported on 03/10/2020)   No facility-administered encounter medications on file as of 03/10/2020.

## 2020-03-23 ENCOUNTER — Ambulatory Visit: Payer: Medicare HMO

## 2020-03-23 ENCOUNTER — Other Ambulatory Visit: Payer: Self-pay

## 2020-03-23 DIAGNOSIS — IMO0002 Reserved for concepts with insufficient information to code with codable children: Secondary | ICD-10-CM

## 2020-03-23 DIAGNOSIS — E1122 Type 2 diabetes mellitus with diabetic chronic kidney disease: Secondary | ICD-10-CM

## 2020-03-23 DIAGNOSIS — E7800 Pure hypercholesterolemia, unspecified: Secondary | ICD-10-CM

## 2020-03-23 NOTE — Chronic Care Management (AMB) (Signed)
Chronic Care Management Pharmacy  Name: ILINE BUCHINGER  MRN: [id number] DOB: 1951-08-19  Chief Complaint/ HPI  [name],  69 y.o. , female presents for their Initial CCM visit with the clinical pharmacist via telephone.  PCP : Owens Loffler, MD  Their chronic conditions include: hypertension, CAD, CHF, allergic rhinitis, Crohn's disease, type 2 diabetes with stage 3 CKD, hyperlipidemia, osteoarthritis, major depressive disorder, gout, adjustment disorder   Patient concerns: several medication questions  Office Visits:  03/10/20: PCP visit - diabetes follow up, not checking BG at home, continues long acting 20 units daily, regular insulin U-500 50 units BID, BP stable, DM uncontrolled but stable, following renal function, declines statin, RTC 3 months   12/08/19: PCP visit - follow up discharge from SNF, multiple hospital admissions for high BS, noncompliance to insulin per medical record, encephalopathy secondary diabetic ketoacidosis, 20 units Lantus added to U500 insulin during hospital stay, reports BS has improved.   Consult Visit:  08/21/19: ED/hospital admission - [facility]  08/04/19: ED/hosptial admission - DKA   06/11/19: Cardiology - no medication changes  Allergies  Allergen Reactions  . Fish Oil Other (See Comments)    Gout  . Glimepiride Other (See Comments)    REACTION: hypoglycemia  . Guanfacine Hcl Other (See Comments)    REACTION: unspecified  . Rosiglitazone Other (See Comments)    CHF   Medications: Outpatient Encounter Medications as of 03/23/2020  Medication Sig  . acetaminophen (TYLENOL) 325 MG tablet Take 325-650 mg by mouth daily as needed for moderate pain or headache.   Marland Kitchen aspirin EC 81 MG EC tablet Take 1 tablet (81 mg total) by mouth daily.  . carvedilol (COREG) 12.5 MG tablet Take 1 tablet (12.5 mg total) by mouth 2 (two) times daily.  . chlorhexidine (PERIDEX) 0.12 % solution RINSE WITH 1 CAPFUL (15ML) FOR 30 SECONDS IN THE MORNING AND IN  THE EVENING AFTER TOOTHBRUSHING DO NOT SWALLOW  . clopidogrel (PLAVIX) 75 MG tablet Take 1 tablet (75 mg total) by mouth daily with breakfast. (Patient not taking: Reported on 03/10/2020)  . Ensure (ENSURE) Take 237 mLs by mouth daily.  Marland Kitchen FLUoxetine (PROZAC) 20 MG capsule Take 1 capsule (20 mg total) by mouth daily.  Marland Kitchen glucose blood (ONETOUCH ULTRA) test strip Use to check blood sugar up to 8 times a day.  . insulin detemir (LEVEMIR) 100 UNIT/ML injection Inject 0.1 mLs (10 Units total) into the skin 2 (two) times daily. (Patient taking differently: Inject 20 Units into the skin 2 (two) times daily. )  . insulin regular human CONCENTRATED (HUMULIN R) 500 UNIT/ML injection Inject 50 Units into the skin 2 (two) times daily with a meal.   . Insulin Syringe-Needle U-100 (B-D INS SYRINGE 0.5CC/31GX5/16) 31G X 5/16" 0.5 ML MISC USE AS DIRECTED THREE TIMES A DAY  . ivabradine (CORLANOR) 5 MG TABS tablet TAKE ONE TABLET TWICE DAILY WITH MEALS (Patient taking differently: TAKE ONE TABLET DAILY AS NEEDED)  . pantoprazole (PROTONIX) 40 MG tablet Take 1 tablet (40 mg total) by mouth daily.  . sacubitril-valsartan (ENTRESTO) 49-51 MG Take 1 tablet by mouth 2 (two) times daily.  Marland Kitchen spironolactone (ALDACTONE) 25 MG tablet Take 1 tablet (25 mg total) by mouth daily.  . traMADol (ULTRAM) 50 MG tablet TAKE ONE TABLET EVERY EIGHT HOURS AS NEEDED FOR PAIN FOR UP TO 7 DAYS   No facility-administered encounter medications on file as of 03/23/2020.   Current Diagnosis/Assessment:   Emergency planning/management officer Strain: Low Risk   .  Difficulty of Paying Living Expenses: Not very hard   Goals    . Pharmacy Care Plan     CARE PLAN ENTRY  Current Barriers:  . Chronic Disease Management support, education, and care coordination needs related to Hyperlipidemia and Diabetes   Hyperlipidemia Lab Results  Component Value Date/Time   LDLCALC 103 (H) 04/22/2019 07:00 AM   LDLDIRECT 192.0 07/11/2018 08:46 AM .  Pharmacist Clinical  Goal(s): o Over the next 30 days, patient will work with PharmD and providers to achieve LDL goal < 70 and reduce cardiovascular risk . Current regimen:  o No pharmacotherapy . Interventions: o Discussed benefits of once weekly statin  . Patient self care activities - Over the next 30 days, patient will: o Restart atorvastatin 80 mg - 1 tablet weekly with current supply at home  Diabetes Lab Results  Component Value Date/Time   HGBA1C 11.4 (A) 03/10/2020 02:11 PM   HGBA1C 9.7 (H) 12/08/2019 03:00 PM   HGBA1C 10.9 (H) 07/08/2019 03:07 PM   HGBA1C >14.0 12/02/2018 12:00 AM .  Pharmacist Clinical Goal(s): o Over the next 30 days, patient will work with PharmD and providers to achieve A1c goal <8% . Current regimen:   Humulin R U-500 - Inject 50 units at breakfast and supper (additional 20 units at bedtime if BG > 400)  Levemir - Inject 20 units BID with breakfast and supper  . Interventions: o Consult with PCP for dose adjustments o Recommend treating hypoglycemia with 15 gm of fast acting carbohydrates such as 3-4 glucose tablets, 1/2 cup of fruit juice, 1 tablespoon of sugar or honey . Patient self care activities - Over the next 30 days, patient will: o Check blood sugar in the morning before eating or drinking, before meals, and at bedtime, document, and provide at future appointments o Contact provider with any episodes of hypoglycemia (< 70 mg/dL)  Initial goal documentation       Hypertension   CMP Latest Ref Rng & Units 12/08/2019 08/28/2019 08/27/2019  Glucose 70 - 99 mg/dL 443(H) 277(H) 256(H)  BUN 6 - 23 mg/dL _0 Creatinine 0.40 - 1.20 mg/dL 1.15 0.71 0.82  Sodium 135 - 145 mEq/L 135 142 140  Potassium 3.5 - 5.1 mEq/L 4.5 3.5 3.5  Chloride 96 - 112 mEq/L 100 110 109  CO2 19 - 32 mEq/L _1 Calcium 8.4 - 10.5 mg/dL 9.8 8.8(L) 8.9  Total Protein 6.0 - 8.3 g/dL 6.8 - -  Total Bilirubin 0.2 - 1.2 mg/dL 0.4 - -  Alkaline Phos 39 - 117 U/L 103 - -  AST 0 -  37 U/L 17 - -  ALT 0 - 35 U/L 13 - -   Office blood pressures are: BP Readings from Last 3 Encounters:  03/10/20 104/60  12/08/19 120/60  09/23/19 102/64   Patient has failed these meds in the past: none reported Patient checks BP at home when feeling symptomatic Patient home BP readings are ranging: none reported  BP goal < 130/80 mmHg Patient is currently controlled on the following medications:   Carvedilol 12.5 mg - 1 tablet BID  Spironolactone 25 mg - 1 tablet daily  We discussed: reports BP is usually "normal" at home   Plan: Continue current medications  Hyperlipidemia   LDL goal < 70 (CAD)  Lipid Panel     Component Value Date/Time   CHOL 170 04/22/2019 0700   TRIG 209 (H) 04/22/2019 0700   HDL 25 (L) 04/22/2019 0700  LDLCALC 103 (H) 04/22/2019 0700   LDLDIRECT 192.0 07/11/2018 0846    Hepatic Function Latest Ref Rng & Units 12/08/2019 08/21/2019 08/04/2019  Total Protein 6.0 - 8.3 g/dL 6.8 8.3(H) 8.7(H)  Albumin 3.5 - 5.2 g/dL 3.9 3.5 3.8  AST 0 - 37 U/L _0 ALT 0 - 35 U/L _1 Alk Phosphatase 39 - 117 U/L 103 245(H) 151(H)  Total Bilirubin 0.2 - 1.2 mg/dL 0.4 1.3(H) 1.1  Bilirubin, Direct 0.0 - 0.3 mg/dL 0.1 - -    The ASCVD Risk score (Opal., et al., 2013) failed to calculate for the following reasons:   The patient has a prior MI or stroke diagnosis   LDL goal < 70 Patient has failed these meds in past:  Lipitor 80 mg was d/c from medlist due to non-adherence and joint pain 08/18/19; fish oil - reports causing gout, reports trying multiple statins Patient is currently uncontrolled on the following medications:  . No pharmacotherapy  CAD: . Aspirin 81 mg - 1 tablet daily  We discussed: reports tried every other day statin and still had symptoms; willing to try once weekly (reports she has atorvastatin 80 mg at home and will restart); pt is not sure if she should be taking Plavix, reports it was not given while she was in nursing home  and has not taken since then. She plans to discuss with cardiology at f/u in 2 weeks.  Plan: Trial once weekly atorvastatin 80 mg with current supply.   Diabetes   Kidney Function Lab Results  Component Value Date/Time   CREATININE 1.15 12/08/2019 03:00 PM   CREATININE 0.71 08/28/2019 04:16 AM   CREATININE 0.72 07/27/2014 09:02 AM   GFR 46.85 (L) 12/08/2019 03:00 PM   GFRNONAA >60 08/28/2019 04:16 AM   GFRNONAA 89 07/27/2014 09:02 AM   GFRAA >60 08/28/2019 04:16 AM   GFRAA >89 07/27/2014 09:02 AM   K 4.5 12/08/2019 03:00 PM   K 3.5 08/28/2019 04:16 AM   Recent Relevant Labs: Lab Results  Component Value Date/Time   HGBA1C 11.4 (A) 03/10/2020 02:11 PM   HGBA1C 9.7 (H) 12/08/2019 03:00 PM   HGBA1C 10.9 (H) 07/08/2019 03:07 PM   HGBA1C >14.0 12/02/2018 12:00 AM   MICROALBUR 18.1 (H) 12/10/2018 10:08 AM   MICROALBUR 36.0 (H) 01/10/2017 03:15 PM   Reviewed MEDICAL RECORD NUMBERPt has seen multiple endocrinologist and frequently lost to follow up. Last visit was with [place] [facility] for initial consult 12/16/19: A1c > 14%. Diagnosed with type 2 diabetes 1998, transitioned to insulin 2005.  Multiple episodes of DKA. Per chart, patient has tried these meds in past: Lantus - not covered by insurance/pt reports Lantus is ineffective, Avandia - HF, glimepiride - hypoglycemia, metformin - unknown, Invokana - increased thirst, Bydureon - did well but cost concern  Self monitoring BG: checks 8x day, wakes up every night at 3 AM to check Recent FBG Readings: 200-250 Hypoglycemia - reports history of labile BG with most lows occurring overnight, denies any recently, treats lows with snack pudding   Patient is currently uncontrolled on the following medications:   Humulin R U-500 - Inject 50 units at breakfast and supper  Reports taking an additional dose of 20 units at bedtime if BG high (> 400)   Levemir - Inject 20 units BID with breakfast and supper   Last diabetic eye exam:  Lab Results   Component Value Date/Time   HMDIABEYEEXA Retinopathy (A) 10/07/2018 12:00 AM  Last diabetic foot exam: 03/2019, podiatry  Diet:  Reports trying to limit carbs to 45 gm per meal, has to limit food choices due to not having top teeth   Breakfast: egg with cheese  Lunch: eats out  Dinner: meat and vegetables (Last night - meat loaf, apples, and fried okra)  Snacks: tries to limit food later in the day; avoids sweets/desserts. Drinks Ensure daily (40 gm carb) after dinner for dessert  Exercise: reports some walking, not a particular amount   Affordability: gets Humulin at no cost from office; $100/month for Levemir, denies cost concerns  Plan: Continue current medications; Consult PCP to discuss increasing Levemir. Would also like to consider Ozempic in the future. Discussed appropriate treatment of hypoglycemia/rule of 15. Avoid pudding as delayed onset. F/u in 4 weeks.  Heart Failure   Type: Systolic  Last ejection fraction: LVEF 20-25%  NYHA Class: II (slight limitation of activity) AHA HF Stage: C (Heart disease and symptoms present)  Patient has failed these meds in past: none reported Patient is currently controlled on the following medications:   Ivabradine 5 mg - 1 tablet BID with meals (no refill history - reports taking PRN chest pain, changes in vision when taking daily)   Entresto 49-51 mg - 1 tablet BID  Carvedilol 12.5 mg - 1 tablet BID  Spironolactone 25 mg - 1 tablet daily  We discussed: ivabradine should not be taken PRN  Symptoms: denies SOB or swelling; reports SOB does not affect daily activities, able to walk daily without SOB  Plan: Continue current medications: Pt to discuss Ivabradine concerns with cardiology at upcoming visit. Recommended holding Ivabradine until then as PRN use not recommended.   GERD   Patient has failed these meds in past: none  Patient is currently controlled on the following medications:   Pantoprazole 40 mg - 1 tablet  daily before breakfast   Symptoms: denies symptoms of acid reflux   Plan: Continue current medications  Depression   Patient has failed these meds in past: none reported Patient is currently controlled on the following medications:   Fluoxetine 20 mg - 1 capsule daily  We discussed: reports doing well  Plan: Continue current medications   Pain   Patient has failed these meds in past: none  Patient is currently controlled on the following medications:   Tramadol 50 mg - 1 tablet q8h   We discussed: takes tramadol for severe pain (< 1 week), denies use of tylenol, NSAIDs, or hydrocodone; hydrocodone was prescribed short term for tooth extraction  Plan: Continue current medications  Medication Management  Pharmacy/Benefits:[employer]  Affordability: denies concerns  CCM Follow Up: 4 weeks (telephone call) before 2 PM   [name], PharmD Clinical Pharmacist [facility] at [facility] [phone]

## 2020-03-30 ENCOUNTER — Other Ambulatory Visit: Payer: Self-pay

## 2020-03-30 ENCOUNTER — Ambulatory Visit: Payer: Medicare HMO | Admitting: Podiatry

## 2020-03-30 DIAGNOSIS — E0843 Diabetes mellitus due to underlying condition with diabetic autonomic (poly)neuropathy: Secondary | ICD-10-CM

## 2020-03-30 DIAGNOSIS — G629 Polyneuropathy, unspecified: Secondary | ICD-10-CM | POA: Diagnosis not present

## 2020-03-30 DIAGNOSIS — E0865 Diabetes mellitus due to underlying condition with hyperglycemia: Secondary | ICD-10-CM

## 2020-03-30 NOTE — Progress Notes (Signed)
   HPI: 69 year old female with PMHx of DM presenting today for her yearly exam.  It has been 2 years since the patient was last seen here in the office.  Patient last seen on 08/07/2018.  Patient states that since then she has had an episode of DKA and was admitted to the hospital and in rehab for 4 months.  She states that since last visit as well she has noticed more numbness and discomfort in her toes.  She does not really have any hurting sensation or pain, just simple numbness.  She presents for further treatment evaluation  Past Medical History:  Diagnosis Date  . Allergic rhinitis   . Allergy   . Cataract    mild   . Crohn's disease ([place]) 10/13/2013  . Diverticulosis   . GERD (gastroesophageal reflux disease)   . Gout   . HFrEF (heart failure with reduced ejection fraction) ([place])    a. 12/2008 Cath: EF 45% w/ inf HK; b. 07/2009 Echo: EF 50-55%; c. 08/2018 Echo: EF 20-25%.  Marland Kitchen Hyperlipidemia   . Hypertension   . IBS (irritable bowel syndrome)   . Left bundle branch block   . Neuromuscular disorder (HCC)    neuropathy  . NICM (nonischemic cardiomyopathy) ([place])    a. 12/2008 Cath: no significant dzs, EF 45% w/ inf HK->Med Rx; b. 07/2009 Echo: EF 50-55%; c. 08/2018 Echo: EF 20-25%, ant/antsept HK, mild MR, mildly dil LA, nl RV fx; d. 08/2018 Cath: D1 80, otw nonobs dzs->Med rx.  . Non-obstructive CAD (coronary artery disease)    a. 12/2008 Cath: no significant dzs, EF 45% w/ inf HK->Med Rx; b. 08/2018 Cath: LM nl, LAD min irregs, D1 80, LCX 54md, RCA min irregs->Med Rx.  . Osteoarthritis   . Poorly controlled Diabetes mellitus    a. 07/2018 A1c 13.1.  .Marland KitchenSymptomatic cholelithiasis   . Uncontrolled type 2 diabetes mellitus with stage 3 chronic kidney disease, with long-term current use of insulin ([place]           Physical Exam: General: The patient is alert and oriented x3 in no acute distress.  Dermatology: Skin is warm, dry and supple bilateral lower extremities. Negative for open  lesions or macerations.  Vascular: Palpable pedal pulses bilaterally. No edema or erythema noted. Capillary refill within normal limits.  Neurological: Epicritic and protective threshold diminished bilaterally.   Musculoskeletal Exam: Range of motion within normal limits to all pedal and ankle joints bilateral. Muscle strength 5/5 in all groups bilateral.   Assessment: - DM with polyneuropathy   Plan of Care:  - Patient evaluated. - Continue topical neuropathy cream from [employer]  - Recommended good shoe gear.  The patient states that she goes barefoot throughout much of the day and I informed her I do not recommend that she goes barefoot.  Recommend that she wears good supportive shoe gear daily to protect her feet. - Return to clinic annually.    BEdrick Kins DPM [facility]  Dr. BEdrick Kins DPM    [address] [place] [address]               Office [phone] Fax [phone]

## 2020-04-02 ENCOUNTER — Telehealth: Payer: Self-pay

## 2020-04-02 NOTE — Progress Notes (Signed)
I have collaborated with the care management provider regarding care management and care coordination activities outlined in this encounter and have reviewed this encounter including documentation in the note and care plan. I am certifying that I agree with the content of this note and encounter as supervising physician.  

## 2020-04-02 NOTE — Telephone Encounter (Signed)
PCP consult regarding CCM visit 03/23/20:  Patient reports fasting BG 200-250. Post-prandials up to 400s. Denies recent episode of hypoglycemia in the past month.   She is taking:  Humulin R U-500 - 50 units at breakfast and supper  Reports taking an additional dose of 20 units at bedtime if BG high (> 400)   Levemir - 20 units BID with breakfast and supper   She is open to increasing Levemir dosing to 25 units BID. I would also like to consider Ozempic. What are your thoughts?  [name], PharmD Clinical Pharmacist [facility] at [facility] [phone]

## 2020-04-02 NOTE — Telephone Encounter (Signed)
Great, I understand. I will let her know about the dose change.  [name], PharmD Clinical Pharmacist [facility] at [facility] [phone]

## 2020-04-02 NOTE — Patient Instructions (Addendum)
Dear Kristy [name],  It was a pleasure meeting you during our initial appointment on March 23, 2020. Below is a summary of the goals we discussed and components of chronic care management. Please contact me anytime with questions or concerns.   Visit Information  Goals Addressed            This Visit's Progress   . Pharmacy Care Plan       CARE PLAN ENTRY  Current Barriers:  . Chronic Disease Management support, education, and care coordination needs related to Hyperlipidemia and Diabetes   Hyperlipidemia Lab Results  Component Value Date/Time   LDLCALC 103 (H) 04/22/2019 07:00 AM   LDLDIRECT 192.0 07/11/2018 08:46 AM .  Pharmacist Clinical Goal(s): o Over the next 30 days, patient will work with PharmD and providers to achieve LDL goal < 70 and reduce cardiovascular risk . Current regimen:  o No pharmacotherapy . Interventions: o Discussed benefits of once weekly statin  . Patient self care activities - Over the next 30 days, patient will: o Restart atorvastatin 80 mg - 1 tablet weekly with current supply at home  Diabetes Lab Results  Component Value Date/Time   HGBA1C 11.4 (A) 03/10/2020 02:11 PM   HGBA1C 9.7 (H) 12/08/2019 03:00 PM   HGBA1C 10.9 (H) 07/08/2019 03:07 PM   HGBA1C >14.0 12/02/2018 12:00 AM .  Pharmacist Clinical Goal(s): o Over the next 30 days, patient will work with PharmD and providers to achieve A1c goal <8% . Current regimen:   Humulin R U-500 - Inject 50 units at breakfast and supper (additional 20 units at bedtime if BG > 400)  Levemir - Inject 20 units BID with breakfast and supper  . Interventions: o Consult with PCP for dose adjustments o Recommend treating hypoglycemia with 15 gm of fast acting carbohydrates such as 3-4 glucose tablets, 1/2 cup of fruit juice, 1 tablespoon of sugar or honey . Patient self care activities - Over the next 30 days, patient will: o Check blood sugar in the morning before eating or drinking, before meals,  and at bedtime, document, and provide at future appointments o Contact provider with any episodes of hypoglycemia (< 70 mg/dL)  Initial goal documentation       Kristy [name] was given information about Chronic Care Management services today including:  1. CCM service includes personalized support from designated clinical staff supervised by her physician, including individualized plan of care and coordination with other care providers 2. 24/7 contact phone numbers for assistance for urgent and routine care needs. 3. Standard insurance, coinsurance, copays and deductibles apply for chronic care management only during months in which we provide at least 20 minutes of these services. Most insurances cover these services at 100%, however patients may be responsible for any copay, coinsurance and/or deductible if applicable. This service may help you avoid the need for more expensive face-to-face services. 4. Only one practitioner may furnish and bill the service in a calendar month. 5. The patient may stop CCM services at any time (effective at the end of the month) by phone call to the office staff.  Patient agreed to services and verbal consent obtained.   The patient verbalized understanding of instructions provided today and agreed to receive a mailed copy of patient instruction and/or educational materials. Telephone follow up appointment with pharmacy team member scheduled for: April 15, 2020 at 10:30 AM (telephone visit)  Kristy [name], PharmD Clinical Pharmacist [facility] at [facility] [phone]   Preventing Hypoglycemia  Hypoglycemia occurs when the level of sugar (glucose) in the blood is too low. Hypoglycemia can happen in people who do or do not have diabetes (diabetes mellitus). It can develop quickly, and it can be a medical emergency. For most people with diabetes, a blood glucose level below 70 mg/dL (3.9 mmol/L) is considered hypoglycemia. Glucose is a type of  sugar that provides the body's main source of energy. Certain hormones (insulin and glucagon) control the level of glucose in the blood. Insulin lowers blood glucose, and glucagon increases blood glucose. Hypoglycemia can result from having too much insulin in the bloodstream, or from not eating enough food that contains glucose. Your risk for hypoglycemia is higher:  If you take insulin or diabetes medicines to help lower your blood glucose or help your body make more insulin.  If you skip or delay a meal or snack.  If you are ill.  During and after exercise. You can prevent hypoglycemia by working with your health care provider to adjust your meal plan as needed and by taking other precautions. How can hypoglycemia affect me? Mild symptoms Mild hypoglycemia may not cause any symptoms. If you do have symptoms, they may include:  Hunger.  Anxiety.  Sweating and feeling clammy.  Dizziness or feeling light-headed.  Sleepiness.  Nausea.  Increased heart rate.  Headache.  Blurry vision.  Irritability.  Tingling or numbness around the mouth, lips, or tongue.  A change in coordination.  Restless sleep. If mild hypoglycemia is not recognized and treated, it can quickly become moderate or severe hypoglycemia. Moderate symptoms Moderate hypoglycemia can cause:  Mental confusion and poor judgment.  Behavior changes.  Weakness.  Irregular heartbeat. Severe symptoms Severe hypoglycemia is a medical emergency. It can cause:  Fainting.  Seizures.  Loss of consciousness (coma).  Death. What nutrition changes can be made?  Work with your health care provider or diet and nutrition specialist (dietitian) to make a healthy meal plan that is right for you. Follow your meal plan carefully.  Eat meals at regular times.  If recommended by your health care provider, have snacks between meals.  Donot skip or delay meals or snacks. You can be at risk for hypoglycemia if you  are not getting enough carbohydrates. What lifestyle changes can be made?   Work closely with your health care provider to manage your blood glucose. Make sure you know: ? Your goal blood glucose levels. ? How and when to check your blood glucose. ? The symptoms of hypoglycemia. It is important to treat it right away to keep it from becoming severe.  Do not drink alcohol on an empty stomach.  When you are ill, check your blood glucose more often than usual. Follow your sick day plan whenever you cannot eat or drink normally. Make this plan in advance with your health care provider.  Always check your blood glucose before, during, and after exercise. How is this treated? This condition can often be treated by immediately eating or drinking something that contains sugar, such as:  Fruit juice, 4-6 oz (120-150 mL).  Regular (not diet) soda, 4-6 oz (120-150 mL).  Low-fat milk, 4 oz (120 mL).  Several pieces of hard candy.  Sugar or honey, 1 Tbsp (15 mL). Treating hypoglycemia if you have diabetes If you are alert and able to swallow safely, follow the 15:15 rule:  Take 15 grams of a rapid-acting carbohydrate. Talk with your health care provider about how much you should take.  Rapid-acting options include: ?  Glucose pills (take 15 grams). ? 6-8 pieces of hard candy. ? 4-6 oz (120-150 mL) of fruit juice. ? 4-6 oz (120-150 mL) of regular (not diet) soda.  Check your blood glucose 15 minutes after you take the carbohydrate.  If the repeat blood glucose level is still at or below 70 mg/dL (3.9 mmol/L), take 15 grams of a carbohydrate again.  If your blood glucose level does not increase above 70 mg/dL (3.9 mmol/L) after 3 tries, seek emergency medical care.  After your blood glucose level returns to normal, eat a meal or a snack within 1 hour. Treating severe hypoglycemia Severe hypoglycemia is when your blood glucose level is at or below 54 mg/dL (3 mmol/L). Severe hypoglycemia  is a medical emergency. Get medical help right away. If you have severe hypoglycemia and you cannot eat or drink, you may need an injection of glucagon. A family member or close friend should learn how to check your blood glucose and how to give you a glucagon injection. Ask your health care provider if you need to have an emergency glucagon injection kit available. Severe hypoglycemia may need to be treated in a hospital. The treatment may include getting glucose through an IV. You may also need treatment for the cause of your hypoglycemia. Where to find more information  American Diabetes Association: www.diabetes.[employer] of Diabetes and Digestive and Kidney Diseases: [employer] Contact a health care provider if:  You have problems keeping your blood glucose in your target range.  You have frequent episodes of hypoglycemia. Get help right away if:  You continue to have hypoglycemia symptoms after eating or drinking something containing glucose.  Your blood glucose level is at or below 54 mg/dL (3 mmol/L).  You faint.  You have a seizure. These symptoms may represent a serious problem that is an emergency. Do not wait to see if the symptoms will go away. Get medical help right away. Call your local emergency services (911 in the [place] Summary  Know the symptoms of hypoglycemia, and when you are at risk for it (such as during exercise or when you are sick). Check your blood glucose often when you are at risk for hypoglycemia.  Hypoglycemia can develop quickly, and it can be dangerous if it is not treated right away. If you have a history of severe hypoglycemia, make sure you know how to use your glucagon injection kit.  Make sure you know how to treat hypoglycemia. Keep a carbohydrate snack available when you may be at risk for hypoglycemia. This information is not intended to replace advice given to you by your health care provider. Make sure you discuss any  questions you have with your health care provider. Document Revised: 01/10/2019 Document Reviewed: 05/16/2017 Elsevier Patient Education  [place].

## 2020-04-02 NOTE — Telephone Encounter (Signed)
I think going up to levemir 25 units bid is ok.  I am very hesitant to aggressively manage myself, since virtually no endos have been able to manage across the [place].

## 2020-04-05 NOTE — Progress Notes (Addendum)
Cardiology Office Note    Date:  04/08/2020   ID:  YEN WANDELL, DOB 03-23-1951, MRN [id number]  PCP:  Owens Loffler, MD  Cardiologist:  Ida Rogue, MD  Electrophysiologist:  None   Chief Complaint: Follow-up  History of Present Illness:   Kristy [name] is a 69 y.o. female with history of nonobstructive CAD as outlined below, HFrEF secondary to NICM, uncontrolled diabetes with recurrent DKA, Crohn's disease, hypertension, hyperlipidemia, LBBB, prior tobacco abuse, gout, GERD who presents for follow-up of her cardiomyopathy.  She was initially diagnosed with cardiomyopathy in 2010 with an EF of 45% at that time.  Diagnostic cath showed minimal, nonobstructive CAD and she was medically managed.  Patient had recovery of LV systolic function later in 2010 with echo showing an EF of 50 to 55%.  Over the years, she has struggled with controlling her diabetes and has required recurrent admissions for DKA with AKI in the past requiring intermittent cessation of heart failure therapy.  In 08/2018 she developed worsening dyspnea with repeat echo showing an EF of 20 to 25%.  She underwent diagnostic cath which again showed predominantly nonobstructive CAD with more significant stenosis in a small diagonal branch.  Continued medical therapy was recommended.  Repeat echo in 12/2018 showed subsequent improvement in LV systolic function with an EF of 50 to 55%, moderate concentric LVH, diastolic dysfunction, normal RV systolic function, normal RV cavity size, mildly dilated left atrium, no significant valvular abnormality.  She was admitted in 04/2019 with altered mental status with difficulty with word finding preceded by a fall in the bathtub.  She was found on the floor by a family member.  She was noted to be febrile and in DKA upon her admission.  High-sensitivity troponin was elevated, peaking at 5667.  Echo on 04/21/2019 showed an EF of 35 to 40%, severe hypokinesis of the mid apical anterior  wall and anteroseptal wall, normal RV systolic function, normal RV cavity size, trileaflet aortic valve with mild aortic annular calcification, mild mitral annular calcification, normal size and structure aortic root.  Imaging of the head/brain was unrevealing for stroke.  Prior to discharge, the patient underwent diagnostic cath which showed stable appearance of coronary artery since 08/2018 without new lesion to explain recent decline in LVEF or elevated troponin.  It was felt the elevated troponin may be due to supply demand ischemia and or stress-induced cardiomyopathy in the setting of severe DKA.  There was focal D1 stenosis remaining at approximately 80% with mild to moderate nonobstructive disease involving the LAD and LCx.  Mildly elevated left ventricular filling pressure was noted.  Continued optimization of evidence-based heart failure therapy was recommended.  She was last seen in the office in 06/2019 and was doing reasonably well from a cardiac perspective without symptoms concerning for angina.  No medication changes were made at that time.  It was recommended she follow-up with [facility] endocrinology.  Since her last visit with [place] she has been admitted to the hospital 4 times with hyperglycemia/DKA, in the setting of medication noncompliance, most recently in 09/2019.  She comes in doing well from a cardiac perspective.  Since she was last seen she notes her functional status and strength have significantly improved.  She has been off clopidogrel since 09/2019 and indicates she was not given this while living in the nursing home.  Otherwise, she has been compliant with aspirin, carvedilol, ivabradine, Entresto, and spironolactone.  She does note some blurry vision if she takes ivabradine  on a scheduled basis and in the setting has been taking as needed for palpitations/chest pain on her own.  That said, she does have a known cataract which may be contributing to her symptoms as well.  No chest pain,  dyspnea, palpitations, dizziness, presyncope, syncope.  No lower extremity swelling, abdominal tension, orthopnea, PND, early satiety.  Her weight has been stable at home and is down approximately 4 pounds today when compared to her last clinic visit.  She goes to [facility] on a daily basis and walks around the anterior perimeter of the store for exercise and tolerates this without issues.  She continues to follow with [place] endocrinology for her diabetes.  She indicates that she feels "fantastic."   Labs independently reviewed: 03/2020 - A1c 11.4 12/2019 - albumin 3.9, AST/ALT normal, HGB 13.0, PLT 196, potassum 4.5, BUN 22, SCr 1.15 08/2019 - magnesium 1.7 04/2019 - TC 170, TG 209, HDL 25, LDL 103  Past Medical History:  Diagnosis Date  . Allergic rhinitis   . Allergy   . Cataract    mild   . Crohn's disease ([place]) 10/13/2013  . Diverticulosis   . GERD (gastroesophageal reflux disease)   . Gout   . HFrEF (heart failure with reduced ejection fraction) ([place])    a. 12/2008 Cath: EF 45% w/ inf HK; b. 07/2009 Echo: EF 50-55%; c. 08/2018 Echo: EF 20-25%.  Marland Kitchen Hyperlipidemia   . Hypertension   . IBS (irritable bowel syndrome)   . Left bundle branch block   . Neuromuscular disorder (HCC)    neuropathy  . NICM (nonischemic cardiomyopathy) ([place])    a. 12/2008 Cath: no significant dzs, EF 45% w/ inf HK->Med Rx; b. 07/2009 Echo: EF 50-55%; c. 08/2018 Echo: EF 20-25%, ant/antsept HK, mild MR, mildly dil LA, nl RV fx; d. 08/2018 Cath: D1 80, otw nonobs dzs->Med rx.  . Non-obstructive CAD (coronary artery disease)    a. 12/2008 Cath: no significant dzs, EF 45% w/ inf HK->Med Rx; b. 08/2018 Cath: LM nl, LAD min irregs, D1 80, LCX 39md, RCA min irregs->Med Rx.  . Osteoarthritis   . Poorly controlled Diabetes mellitus    a. 07/2018 A1c 13.1.  .Marland KitchenSymptomatic cholelithiasis   . Uncontrolled type 2 diabetes mellitus with stage 3 chronic kidney disease, with long-term current use of insulin ([place]          Past  Surgical History:  Procedure Laterality Date  . BREAST BIOPSY Right 06/24/2019   stereo bx/ x clip/ path pending  . BREAST CYST ASPIRATION    . COLONOSCOPY    . DILATION AND CURETTAGE OF UTERUS    . LEFT HEART CATH AND CORONARY ANGIOGRAPHY N/A 04/24/2019   Procedure: LEFT HEART CATH AND CORONARY ANGIOGRAPHY;  Surgeon: ENelva Bush MD;  [facility];  Service: Cardiovascular;  Laterality: N/A;  . RIGHT/LEFT HEART CATH AND CORONARY ANGIOGRAPHY N/A 08/26/2018   Procedure: RIGHT/LEFT HEART CATH AND CORONARY ANGIOGRAPHY;  Surgeon: AWellington [place] MD;  [facility];  Service: Cardiovascular;  Laterality: N/A;  . SHOULDER SURGERY     15 + yrs ago   . SKIN SURGERY     nose   . VAGINAL HYSTERECTOMY      Current Medications: Current Meds  Medication Sig  . aspirin EC 81 MG EC tablet Take 1 tablet (81 mg total) by mouth daily.  . carvedilol (COREG) 12.5 MG tablet Take 1 tablet (12.5 mg total) by mouth 2 (two) times daily.  .Marland Kitchen  chlorhexidine (PERIDEX) 0.12 % solution RINSE WITH 1 CAPFUL (15ML) FOR 30 SECONDS IN THE MORNING AND IN THE EVENING AFTER TOOTHBRUSHING DO NOT SWALLOW  . Ensure (ENSURE) Take 237 mLs by mouth daily.  Marland Kitchen FLUoxetine (PROZAC) 20 MG capsule Take 1 capsule (20 mg total) by mouth daily.  Marland Kitchen glucose blood (ONETOUCH ULTRA) test strip Use to check blood sugar up to 8 times a day.  . insulin detemir (LEVEMIR) 100 UNIT/ML injection Inject 0.1 mLs (10 Units total) into the skin 2 (two) times daily. (Patient taking differently: Inject 20 Units into the skin 2 (two) times daily. )  . insulin regular human CONCENTRATED (HUMULIN R) 500 UNIT/ML injection Inject 50 Units into the skin 2 (two) times daily with a meal.   . Insulin Syringe-Needle U-100 (B-D INS SYRINGE 0.5CC/31GX5/16) 31G X 5/16" 0.5 ML MISC USE AS DIRECTED THREE TIMES A DAY  . ivabradine (CORLANOR) 5 MG TABS tablet TAKE ONE TABLET TWICE DAILY WITH MEALS (Patient taking differently: TAKE  ONE TABLET DAILY AS NEEDED)  . pantoprazole (PROTONIX) 40 MG tablet Take 1 tablet (40 mg total) by mouth daily.  . sacubitril-valsartan (ENTRESTO) 49-51 MG Take 1 tablet by mouth 2 (two) times daily.  Marland Kitchen spironolactone (ALDACTONE) 25 MG tablet Take 1 tablet (25 mg total) by mouth daily.  . traMADol (ULTRAM) 50 MG tablet TAKE ONE TABLET EVERY EIGHT HOURS AS NEEDED FOR PAIN FOR UP TO 7 DAYS    Allergies:   Fish oil, Glimepiride, Guanfacine hcl, and Rosiglitazone   Social History   Socioeconomic History  . Marital status: Widowed    Spouse name: Not on file  . Number of children: Not on file  . Years of education: Not on file  . Highest education level: Not on file  Occupational History  . Not on file  Tobacco Use  . Smoking status: Former Smoker    Packs/day: 0.75    Years: 30.00    Pack years: 22.50    Types: Cigarettes    Quit date: 06/06/1997    Years since quitting: 22.8  . Smokeless tobacco: Never Used  Vaping Use  . Vaping Use: Never used  Substance and Sexual Activity  . Alcohol use: No  . Drug use: No  . Sexual activity: Not on file  Other Topics Concern  . Not on file  Social History Narrative  . Not on file   Social Determinants of Health   Financial Resource Strain: Low Risk   . Difficulty of Paying Living Expenses: Not very hard  Food Insecurity:   . Worried About Charity fundraiser in the Last Year:   . Arboriculturist in the Last Year:   Transportation Needs:   . Film/video editor (Medical):   Marland Kitchen Lack of Transportation (Non-Medical):   Physical Activity:   . Days of Exercise per Week:   . Minutes of Exercise per Session:   Stress:   . Feeling of Stress :   Social Connections:   . Frequency of Communication with Friends and Family:   . Frequency of Social Gatherings with Friends and Family:   . Attends Religious Services:   . Active Member of Clubs or Organizations:   . Attends Archivist Meetings:   Marland Kitchen Marital Status:      Family  History:  The patient's family history includes Hypertension in her father; Kidney failure in her brother and mother. There is no history of Colon cancer, Colon polyps, Rectal cancer, Stomach cancer,  or Breast cancer.  ROS:   Review of Systems  Constitutional: Negative for chills, diaphoresis, fever, malaise/fatigue and weight loss.  HENT: Negative for congestion.   Eyes: Negative for discharge and redness.  Respiratory: Negative for cough, sputum production, shortness of breath and wheezing.   Cardiovascular: Negative for chest pain, palpitations, orthopnea, claudication, leg swelling and PND.  Gastrointestinal: Negative for abdominal pain, heartburn, nausea and vomiting.  Musculoskeletal: Negative for falls and myalgias.  Skin: Negative for rash.  Neurological: Negative for dizziness, tingling, tremors, sensory change, speech change, focal weakness, loss of consciousness and weakness.  Endo/Heme/Allergies: Does not bruise/bleed easily.  Psychiatric/Behavioral: Negative for substance abuse. The patient is not nervous/anxious.   All other systems reviewed and are negative.    EKGs/Labs/Other Studies Reviewed:    Studies reviewed were summarized above. The additional studies were reviewed today:  LHC 04/2019: Conclusions: 1. Stable appearance of coronary arteries since 08/2018 without new lesion to explain recent decline in LVEF or troponin elevation.  I suspect elevated troponin may be due to supply-demand mismatch and/or stress-induced cardiomyopathy in the setting of severe DKA. 2. Focal D1 stenosis remains ~80%.  There is mild to moderate, non-obstructive disease involving the LAD and LCx. 3. Mildly elevated left ventricular filling pressure.  Recommendations: 1. Continue optimization of evidence-based heart failure.  Will continue carvedilol 3.125 mg BID and restart Entresto today. 2. Maintain net even fluid balance; could consider started gentle diuresis tomorrow as renal  function allows. 3. Dual antiplatelet therapy with aspirin and clopidogrel for 12 months. 4. Aggressive secondary prevention. __________  2D echo 04/2019: 1. Severe hypokinesis of the left ventricular, mid-apical anterior wall  and anteroseptal wall.  2. The left ventricle has moderately reduced systolic function, with an  ejection fraction of 35-40%. The cavity size was normal. Left ventricular  diastolic function could not be evaluated.  3. The right ventricle has normal systolic function. The cavity was  normal. There is mildly increased right ventricular wall thickness.  4. Left atrial size was not well visualized.  5. The aortic valve is tricuspid. Mild thickening of the aortic valve.  Aortic valve regurgitation was not assessed by color flow Doppler. Mild  aortic annular calcification noted.  6. The mitral valve was not well visualized. There is mild mitral annular  calcification present.  7. The aortic root is normal in size and structure.  8. The interatrial septum was not well visualized.   EKG:  EKG is ordered today.  The EKG ordered today demonstrates NSR, 81 bpm, LBBB (known)  Recent Labs: 08/07/2019: TSH 1.325 08/24/2019: B Natriuretic Peptide 303.0 08/26/2019: Magnesium 1.7 12/08/2019: ALT 13; BUN 22; Creatinine, Ser 1.15; Hemoglobin 13.0; Platelets 196.0; Potassium 4.5; Sodium 135  Recent Lipid Panel    Component Value Date/Time   CHOL 170 04/22/2019 0700   TRIG 209 (H) 04/22/2019 0700   HDL 25 (L) 04/22/2019 0700   CHOLHDL 6.8 04/22/2019 0700   VLDL 42 (H) 04/22/2019 0700   LDLCALC 103 (H) 04/22/2019 0700   LDLDIRECT 192.0 07/11/2018 0846    PHYSICAL EXAM:    VS:  BP 140/80 (BP Location: Left Arm, Patient Position: Sitting, Cuff Size: Normal)   Pulse 81   Ht 5' 3"  (1.6 m)   Wt 152 lb 8 oz (69.2 kg)   SpO2 98%   BMI 27.01 kg/m   BMI: Body mass index is 27.01 kg/m.  Physical Exam Constitutional:      Appearance: She is well-developed.  HENT:  Head: Normocephalic and atraumatic.  Eyes:     General:        Right eye: No discharge.        Left eye: No discharge.  Neck:     Vascular: No JVD.  Cardiovascular:     Rate and Rhythm: Normal rate and regular rhythm.     Pulses: No midsystolic click and no opening snap.          Posterior tibial pulses are 2+ on the right side and 2+ on the left side.     Heart sounds: Normal heart sounds, S1 normal and S2 normal. Heart sounds not distant. No murmur heard.  No friction rub.  Pulmonary:     Effort: Pulmonary effort is normal. No respiratory distress.     Breath sounds: Normal breath sounds. No decreased breath sounds, wheezing or rales.  Chest:     Chest wall: No tenderness.  Abdominal:     General: There is no distension.     Palpations: Abdomen is soft.     Tenderness: There is no abdominal tenderness.  Musculoskeletal:     Cervical back: Normal range of motion.  Skin:    General: Skin is warm and dry.     Nails: There is no clubbing.  Neurological:     Mental Status: She is alert and oriented to person, place, and time.  Psychiatric:        Speech: Speech normal.        Behavior: Behavior normal.        Thought Content: Thought content normal.        Judgment: Judgment normal.     Wt Readings from Last 3 Encounters:  04/08/20 152 lb 8 oz (69.2 kg)  03/10/20 148 lb 8 oz (67.4 kg)  12/08/19 147 lb (66.7 kg)     ASSESSMENT & PLAN:   1. Nonobstructive CAD: She comes in doing well and denies any symptoms concerning for angina.  Most recent cardiac cath from 04/2019 demonstrated stable appearance of known stenosis of a small D1 and otherwise nonobstructive disease as outlined above.  Continue risk factor modification and medical management.  She has been off Plavix since 09/2019 as she was not receiving this at the nursing home.  Given that it has now been approximately 12 months since her last cath without intervention this can be discontinued at this time.  She should  continue aspirin and carvedilol.  She is no longer taking a statin secondary to myalgias.  No plans for further ischemic evaluation at this time.  2. HFrEF secondary to NICM: She has NYHA class I symptoms.  She appears euvolemic and well compensated.  Continue evidence-based therapy including carvedilol, ivabradine, Entresto, and spironolactone.  Schedule echo to evaluate for improved LV systolic function on GDMT.  CHF education.  Check BMP.  3. Difficult to control insulin-dependent diabetes with recurrent DKA: Followed by [facility] endocrinology.  4. HTN: Blood pressure reasonably controlled today.  Continue current therapy.  5. HLD: LDL 103 from 04/2019.  Intolerant to statins secondary to myalgias.  Disposition: F/u with Dr. Rockey Situ or an APP in 6 months.   Medication Adjustments/Labs and Tests Ordered: Current medicines are reviewed at length with the patient today.  Concerns regarding medicines are outlined above. Medication changes, Labs and Tests ordered today are summarized above and listed in the Patient Instructions accessible in Encounters.   Signed, Christell Faith, PA-C 04/08/2020 2:04 PM     [facility] [address]  [place], [place] [address] [phone]   ________  05/03/2020:  Asked by pharmacy to add myalgia diagnosis to remove patient from [facility] (statin use in patients with cardiovascular disease) measure. Diagnosis code added.

## 2020-04-06 ENCOUNTER — Telehealth: Payer: Medicare HMO

## 2020-04-06 DIAGNOSIS — H2513 Age-related nuclear cataract, bilateral: Secondary | ICD-10-CM | POA: Diagnosis not present

## 2020-04-06 DIAGNOSIS — E113393 Type 2 diabetes mellitus with moderate nonproliferative diabetic retinopathy without macular edema, bilateral: Secondary | ICD-10-CM | POA: Diagnosis not present

## 2020-04-06 LAB — HM DIABETES EYE EXAM

## 2020-04-08 ENCOUNTER — Other Ambulatory Visit: Payer: Self-pay

## 2020-04-08 ENCOUNTER — Encounter: Payer: Self-pay | Admitting: Physician Assistant

## 2020-04-08 ENCOUNTER — Ambulatory Visit: Payer: Medicare HMO | Admitting: Physician Assistant

## 2020-04-08 VITALS — BP 140/80 | HR 81 | Ht 63.0 in | Wt 152.5 lb

## 2020-04-08 DIAGNOSIS — M791 Myalgia, unspecified site: Secondary | ICD-10-CM

## 2020-04-08 DIAGNOSIS — I5022 Chronic systolic (congestive) heart failure: Secondary | ICD-10-CM

## 2020-04-08 DIAGNOSIS — Z794 Long term (current) use of insulin: Secondary | ICD-10-CM | POA: Diagnosis not present

## 2020-04-08 DIAGNOSIS — E785 Hyperlipidemia, unspecified: Secondary | ICD-10-CM

## 2020-04-08 DIAGNOSIS — I2510 Atherosclerotic heart disease of native coronary artery without angina pectoris: Secondary | ICD-10-CM | POA: Diagnosis not present

## 2020-04-08 DIAGNOSIS — E1165 Type 2 diabetes mellitus with hyperglycemia: Secondary | ICD-10-CM

## 2020-04-08 DIAGNOSIS — I428 Other cardiomyopathies: Secondary | ICD-10-CM

## 2020-04-08 DIAGNOSIS — I10 Essential (primary) hypertension: Secondary | ICD-10-CM

## 2020-04-08 NOTE — Patient Instructions (Signed)
Medication Instructions:  Your physician has recommended you make the following change in your medication:  1. STOP Clopidogrel (Plavix)  *If you need a refill on your cardiac medications before your next appointment, please call your pharmacy*   Lab Work: BMET today  If you have labs (blood work) drawn today and your tests are completely normal, you will receive your results only by:  [place] (if you have MyChart) OR  A paper copy in the mail If you have any lab test that is abnormal or we need to change your treatment, we will call you to review the results.   Testing/Procedures: Your physician has requested that you have an echocardiogram. Echocardiography is a painless test that uses sound waves to create images of your heart. It provides your doctor with information about the size and shape of your heart and how well your hearts chambers and valves are working. This procedure takes approximately one hour. There are no restrictions for this procedure.     Follow-Up: At [facility], you and your health needs are our priority.  As part of our continuing mission to provide you with exceptional heart care, we have created designated Provider Care Teams.  These Care Teams include your primary Cardiologist (physician) and Advanced Practice Providers (APPs -  Physician Assistants and Nurse Practitioners) who all work together to provide you with the care you need, when you need it.   Your next appointment:   6 month(s)  The format for your next appointment:   In Person  Provider:    You may see Ida Rogue, MD or one of the following Advanced Practice Providers on your designated Care Team:    Murray Hodgkins, NP  Christell Faith, PA-C  Marrianne Mood, PA-C

## 2020-04-09 LAB — BASIC METABOLIC PANEL WITH GFR
BUN/Creatinine Ratio: 19 (ref 12–28)
BUN: 16 mg/dL (ref 8–27)
CO2: 22 mmol/L (ref 20–29)
Calcium: 10.7 mg/dL — ABNORMAL HIGH (ref 8.7–10.3)
Chloride: 97 mmol/L (ref 96–106)
Creatinine, Ser: 0.86 mg/dL (ref 0.57–1.00)
GFR calc Af Amer: 80 mL/min/1.73
GFR calc non Af Amer: 70 mL/min/1.73
Glucose: 350 mg/dL — ABNORMAL HIGH (ref 65–99)
Potassium: 4.8 mmol/L (ref 3.5–5.2)
Sodium: 136 mmol/L (ref 134–144)

## 2020-04-14 ENCOUNTER — Encounter: Payer: Self-pay | Admitting: Family Medicine

## 2020-04-15 ENCOUNTER — Ambulatory Visit: Payer: Medicare HMO

## 2020-04-15 ENCOUNTER — Other Ambulatory Visit: Payer: Self-pay

## 2020-04-15 ENCOUNTER — Telehealth: Payer: Self-pay

## 2020-04-15 DIAGNOSIS — IMO0002 Reserved for concepts with insufficient information to code with codable children: Secondary | ICD-10-CM

## 2020-04-15 DIAGNOSIS — E7800 Pure hypercholesterolemia, unspecified: Secondary | ICD-10-CM

## 2020-04-15 DIAGNOSIS — E1122 Type 2 diabetes mellitus with diabetic chronic kidney disease: Secondary | ICD-10-CM

## 2020-04-15 NOTE — Telephone Encounter (Signed)
Attempted to reach Ms. Stclair by telephone on 04/15/20 at 10:30 AM for CCM follow up visit. No voicemail box available. Will attempt to reschedule for later date.   [name], PharmD Clinical Pharmacist [facility] [phone]

## 2020-04-15 NOTE — Chronic Care Management (AMB) (Signed)
Chronic Care Management Pharmacy  Name: CHANNEL PAPANDREA  MRN: [id number] DOB: 1951/06/10  Chief Complaint/ HPI  Kristy [name],  69 y.o. , female presents for their Follow-Up CCM visit with the clinical pharmacist via telephone. Patient missed appointment time, but called back and was able to discuss changes briefly.   PCP : Owens Loffler, MD  Their chronic conditions include: hypertension, CAD, CHF, allergic rhinitis, Crohn's disease, type 2 diabetes with stage 3 CKD, hyperlipidemia, osteoarthritis, major depressive disorder, gout, adjustment disorder   Patient concerns: several medication questions  Last CCM visit 03/23/20 - consulted with PCP to increase Levemir to 25 units BID  Office Visits:  03/10/20: PCP visit - diabetes follow up, not checking BG at home, continues long acting 20 units daily, regular insulin U-500 50 units BID, BP stable, DM uncontrolled but stable, following renal function, declines statin, RTC 3 months   12/08/19: PCP visit - follow up discharge from SNF, multiple hospital admissions for high BS, noncompliance to insulin per medical record, encephalopathy secondary diabetic ketoacidosis, 20 units Lantus added to U500 insulin during hospital stay, reports BS has improved.   Consult Visit:  04/08/20: Cardiology - Continue optimization of evidence-based heart failure. Will continue carvedilol 3.125 mg BID and restart Entresto today. Maintain net even fluid balance; could consider started gentle diuresis tomorrow as renal function allows. Dual antiplatelet therapy with aspirin and clopidogrel for 12 months. Aggressive secondary prevention  03/30/20: Podiatry - annual foot exam for DM, Continue topical neuropathy cream from [employer]. Recommend good shoe gear. RTC 12 months.   08/21/19: ED/hospital admission - [facility]  08/04/19: ED/hosptial admission - DKA   06/11/19: Cardiology - no medication changes  Allergies  Allergen Reactions   Fish Oil Other (See  Comments)    Gout   Glimepiride Other (See Comments)    REACTION: hypoglycemia   Guanfacine Hcl Other (See Comments)    REACTION: unspecified   Rosiglitazone Other (See Comments)    CHF   Medications: Outpatient Encounter Medications as of 04/15/2020  Medication Sig   acetaminophen (TYLENOL) 325 MG tablet Take 325-650 mg by mouth daily as needed for moderate pain or headache.  (Patient not taking: Reported on 03/23/2020)   aspirin EC 81 MG EC tablet Take 1 tablet (81 mg total) by mouth daily.   carvedilol (COREG) 12.5 MG tablet Take 1 tablet (12.5 mg total) by mouth 2 (two) times daily.   chlorhexidine (PERIDEX) 0.12 % solution RINSE WITH 1 CAPFUL (15ML) FOR 30 SECONDS IN THE MORNING AND IN THE EVENING AFTER TOOTHBRUSHING DO NOT SWALLOW   Ensure (ENSURE) Take 237 mLs by mouth daily.   FLUoxetine (PROZAC) 20 MG capsule Take 1 capsule (20 mg total) by mouth daily.   glucose blood (ONETOUCH ULTRA) test strip Use to check blood sugar up to 8 times a day.   insulin detemir (LEVEMIR) 100 UNIT/ML injection Inject 0.1 mLs (10 Units total) into the skin 2 (two) times daily. (Patient taking differently: Inject 20 Units into the skin 2 (two) times daily. )   insulin regular human CONCENTRATED (HUMULIN R) 500 UNIT/ML injection Inject 50 Units into the skin 2 (two) times daily with a meal.    Insulin Syringe-Needle U-100 (B-D INS SYRINGE 0.5CC/31GX5/16) 31G X 5/16" 0.5 ML MISC USE AS DIRECTED THREE TIMES A DAY   ivabradine (CORLANOR) 5 MG TABS tablet TAKE ONE TABLET TWICE DAILY WITH MEALS (Patient taking differently: TAKE ONE TABLET DAILY AS NEEDED)   pantoprazole (PROTONIX) 40 MG tablet  Take 1 tablet (40 mg total) by mouth daily.   sacubitril-valsartan (ENTRESTO) 49-51 MG Take 1 tablet by mouth 2 (two) times daily.   spironolactone (ALDACTONE) 25 MG tablet Take 1 tablet (25 mg total) by mouth daily.   traMADol (ULTRAM) 50 MG tablet TAKE ONE TABLET EVERY EIGHT HOURS AS NEEDED FOR PAIN  FOR UP TO 7 DAYS   No facility-administered encounter medications on file as of 04/15/2020.   Current Diagnosis/Assessment:   [employer]chant [employer] officer: Low Risk    Difficulty of Paying Living Expenses: Not very hard   Goals     Pharmacy Care Plan     CARE PLAN ENTRY  Current Barriers:   Chronic Disease Management support, education, and care coordination needs related to Hyperlipidemia and Diabetes   Hyperlipidemia Lab Results  Component Value Date/Time   LDLCALC 103 (H) 04/22/2019 07:00 AM   LDLDIRECT 192.0 07/11/2018 08:46 AM   Pharmacist Clinical Goal(s): o Over the next 30 days, patient will work with PharmD and providers to achieve LDL goal < 70 and reduce cardiovascular risk  Current regimen:  o No pharmacotherapy  Interventions: o Discussed benefits of once weekly statin   Patient self care activities - Over the next 30 days, patient will: o Restart atorvastatin 80 mg - 1 tablet weekly with current supply at home  Diabetes Lab Results  Component Value Date/Time   HGBA1C 11.4 (A) 03/10/2020 02:11 PM   HGBA1C 9.7 (H) 12/08/2019 03:00 PM   HGBA1C 10.9 (H) 07/08/2019 03:07 PM   HGBA1C >14.0 12/02/2018 12:00 AM   Pharmacist Clinical Goal(s): o Over the next 30 days, patient will work with PharmD and providers to achieve A1c goal <8%  Current regimen:   Humulin R U-500 - Inject 50 units at breakfast and supper (additional 20 units at bedtime if BG > 400)  Levemir - Inject 25 units twice daily with breakfast and supper   Interventions: o Recommend treating hypoglycemia with 15 gm of fast acting carbohydrates such as 3-4 glucose tablets, 1/2 cup of fruit juice, 1 tablespoon of sugar or honey o Recommend CGM monitoring   Patient self care activities - Over the next 30 days, patient will: o Continue Levemir 25 units twice daily o Return to clinic for application of Dexcom monitor  o Check blood sugar in the morning before eating or drinking, before meals, and  at bedtime, document, and provide at future appointments o Contact provider with any episodes of hypoglycemia (< 70 mg/dL)  Please see past updates related to this goal by clicking on the "Past Updates" button in the selected goal       Hyperlipidemia   LDL goal < 70 (CAD)  Lipid Panel     Component Value Date/Time   CHOL 170 04/22/2019 0700   TRIG 209 (H) 04/22/2019 0700   HDL 25 (L) 04/22/2019 0700   LDLCALC 103 (H) 04/22/2019 0700   LDLDIRECT 192.0 07/11/2018 0846    Hepatic Function Latest Ref Rng & Units 12/08/2019 08/21/2019 08/04/2019  Total Protein 6.0 - 8.3 g/dL 6.8 8.3(H) 8.7(H)  Albumin 3.5 - 5.2 g/dL 3.9 3.5 3.8  AST 0 - 37 U/L _0 ALT 0 - 35 U/L _1 Alk Phosphatase 39 - 117 U/L 103 245(H) 151(H)  Total Bilirubin 0.2 - 1.2 mg/dL 0.4 1.3(H) 1.1  Bilirubin, Direct 0.0 - 0.3 mg/dL 0.1 - -    The ASCVD Risk score Mikey Bussing DC Jr., et al., 2013) failed to calculate for  the following reasons:   The patient has a prior MI or stroke diagnosis   LDL goal < 70 Patient has failed these meds in past:  Lipitor 80 mg was d/c from medlist due to non-adherence and joint pain 08/18/19; fish oil - reports causing gout, reports trying multiple statins Patient is currently uncontrolled on the following medications:   Atorvastatin 80 mg - once weekly   CAD:  Aspirin 81 mg - 1 tablet daily  We discussed: At initial visit, recommended weekly atorvastatin, did not have time to discuss today. Follow up at next visit.   Plan: Continue current medications   Diabetes   Kidney Function Lab Results  Component Value Date/Time   CREATININE 0.86 04/08/2020 02:47 PM   CREATININE 1.15 12/08/2019 03:00 PM   CREATININE 0.72 07/27/2014 09:02 AM   GFR 46.85 (L) 12/08/2019 03:00 PM   GFRNONAA 70 04/08/2020 02:47 PM   GFRNONAA 89 07/27/2014 09:02 AM   GFRAA 80 04/08/2020 02:47 PM   GFRAA >89 07/27/2014 09:02 AM   K 4.8 04/08/2020 02:47 PM   K 4.5 12/08/2019 03:00 PM   Recent  Relevant Labs: Lab Results  Component Value Date/Time   HGBA1C 11.4 (A) 03/10/2020 02:11 PM   HGBA1C 9.7 (H) 12/08/2019 03:00 PM   HGBA1C 10.9 (H) 07/08/2019 03:07 PM   HGBA1C >14.0 12/02/2018 12:00 AM   MICROALBUR 18.1 (H) 12/10/2018 10:08 AM   MICROALBUR 36.0 (H) 01/10/2017 03:15 PM   Reviewed MEDICAL RECORD NUMBERPt has seen multiple endocrinologist and frequently lost to follow up. Last visit was with [facility] for initial consult 12/16/19: A1c > 14%. Diagnosed with type 2 diabetes 1998, transitioned to insulin 2005.  Multiple episodes of DKA. Per chart, patient has tried these meds in past: Lantus - not covered by insurance/pt reports Lantus is ineffective, Avandia - HF, glimepiride - hypoglycemia, metformin - unknown, Invokana - increased thirst, Bydureon - did well but cost concern; Pt reports she is not currently seeing an endocrinologist.   Self monitoring BG: checks 8x day ---> pt is very interested in CGM Pt reports increasing Levemir to 25 units BID as directed and states sugars have improved. Reports fasting BG in 200s still. Late afternoon readings in 400s. Denies hypoglycemia. Pt self-decreased her Humulin R to 40 units before meals after we increased her Levemir. Reports the following readings from meter:   Before supper: 490, 496, 358  Bedtime: 232, 170, 153  Fasting: 279 (this morning)  Patient is currently uncontrolled on the following medications:   Humulin R U-500 - Inject 40 units at breakfast and supper  Reports taking an additional dose of 20 units at bedtime if BG high (> 400)   Levemir - Inject 25 units BID with breakfast and supper   Last diabetic eye exam:  Lab Results  Component Value Date/Time   HMDIABEYEEXA Retinopathy (A) 04/06/2020 12:00 AM    Last diabetic foot exam: 03/2019, podiatry  Diet:  Reports trying to limit carbs to 45 gm per meal, has to limit food choices due to not having top teeth   Breakfast: egg with cheese  Lunch: eats  out  Dinner: meat and vegetables (Last night - meat loaf, apples, and fried okra)  Snacks: tries to limit food later in the day; avoids sweets/desserts. Drinks Ensure daily (40 gm carb) after dinner for dessert  Exercise: reports some walking, not a particular amount   Affordability: gets Humulin at no cost from office; $100/month for Levemir, denies cost concerns  Plan:  Continue current medications; Schedule in person appointment to review glucometer readings and try Dexcom.   Medication Management  Pharmacy/Benefits:[facility]  Affordability: denies concerns  CCM Follow Up: 2 weeks, in office for Dexcom sample   [name], PharmD Clinical Pharmacist [facility] at [facility] [phone]

## 2020-05-03 ENCOUNTER — Telehealth: Payer: Self-pay

## 2020-05-03 NOTE — Patient Instructions (Signed)
Dear Kristy [name],  Below is a summary of the goals we discussed during our follow up appointment on April 15, 2020. Please contact me anytime with questions or concerns.    Visit Information  Goals Addressed            This Visit's Progress   . Pharmacy Care Plan       CARE PLAN ENTRY  Current Barriers:  . Chronic Disease Management support, education, and care coordination needs related to Hyperlipidemia and Diabetes   Hyperlipidemia Lab Results  Component Value Date/Time   LDLCALC 103 (H) 04/22/2019 07:00 AM   LDLDIRECT 192.0 07/11/2018 08:46 AM .  Pharmacist Clinical Goal(s): o Over the next 30 days, patient will work with PharmD and providers to achieve LDL goal < 70 and reduce cardiovascular risk . Current regimen:  o No pharmacotherapy . Interventions: o Discussed benefits of once weekly statin  . Patient self care activities - Over the next 30 days, patient will: o Restart atorvastatin 80 mg - 1 tablet weekly with current supply at home  Diabetes Lab Results  Component Value Date/Time   HGBA1C 11.4 (A) 03/10/2020 02:11 PM   HGBA1C 9.7 (H) 12/08/2019 03:00 PM   HGBA1C 10.9 (H) 07/08/2019 03:07 PM   HGBA1C >14.0 12/02/2018 12:00 AM .  Pharmacist Clinical Goal(s): o Over the next 30 days, patient will work with PharmD and providers to achieve A1c goal <8% . Current regimen:   Humulin R U-500 - Inject 50 units at breakfast and supper (additional 20 units at bedtime if BG > 400)  Levemir - Inject 25 units twice daily with breakfast and supper  . Interventions: o Recommend treating hypoglycemia with 15 gm of fast acting carbohydrates such as 3-4 glucose tablets, 1/2 cup of fruit juice, 1 tablespoon of sugar or honey o Recommend CGM monitoring  . Patient self care activities - Over the next 30 days, patient will: o Continue Levemir 25 units twice daily o Return to clinic for application of Dexcom monitor  o Check blood sugar in the morning before eating or  drinking, before meals, and at bedtime, document, and provide at future appointments o Contact provider with any episodes of hypoglycemia (< 70 mg/dL)  Please see past updates related to this goal by clicking on the "Past Updates" button in the selected goal       The patient verbalized understanding of instructions provided today and agreed to receive a mailed copy of patient instruction and/or educational materials.  Telephone follow up appointment with pharmacy team member scheduled for: May 06, 2020 at 1:30 PM in office   [name], PharmD Clinical Pharmacist [facility] [place]  Carbohydrate Counting for Diabetes Mellitus, Adult  Carbohydrate counting is a method of keeping track of how many carbohydrates you eat. Eating carbohydrates naturally increases the amount of sugar (glucose) in the blood. Counting how many carbohydrates you eat helps keep your blood glucose within normal limits, which helps you manage your diabetes (diabetes mellitus). It is important to know how many carbohydrates you can safely have in each meal. This is different for every person. A diet and nutrition specialist (registered dietitian) can help you make a meal plan and calculate how many carbohydrates you should have at each meal and snack. Carbohydrates are found in the following foods:  Grains, such as breads and cereals.  Dried beans and soy products.  Starchy vegetables, such as potatoes, peas, and corn.  Fruit and fruit juices.  Milk and yogurt.  Sweets and snack foods, such as cake, cookies, candy, chips, and soft drinks. How do I count carbohydrates? There are two ways to count carbohydrates in food. You can use either of the methods or a combination of both. Reading "Nutrition Facts" on packaged food The "Nutrition Facts" list is included on the labels of almost all packaged foods and beverages in the [place] It includes:  The serving size.  Information  about nutrients in each serving, including the grams (g) of carbohydrate per serving. To use the "Nutrition Facts":  Decide how many servings you will have.  Multiply the number of servings by the number of carbohydrates per serving.  The resulting number is the total amount of carbohydrates that you will be having. Learning standard serving sizes of other foods When you eat carbohydrate foods that are not packaged or do not include "Nutrition Facts" on the label, you need to measure the servings in order to count the amount of carbohydrates:  Measure the foods that you will eat with a food scale or measuring cup, if needed.  Decide how many standard-size servings you will eat.  Multiply the number of servings by 15. Most carbohydrate-rich foods have about 15 g of carbohydrates per serving. ? For example, if you eat 8 oz (170 g) of strawberries, you will have eaten 2 servings and 30 g of carbohydrates (2 servings x 15 g = 30 g).  For foods that have more than one food mixed, such as soups and casseroles, you must count the carbohydrates in each food that is included. The following list contains standard serving sizes of common carbohydrate-rich foods. Each of these servings has about 15 g of carbohydrates:   hamburger bun or  English muffin.   oz (15 mL) syrup.   oz (14 g) jelly.  1 slice of bread.  1 six-inch tortilla.  3 oz (85 g) cooked rice or pasta.  4 oz (113 g) cooked dried beans.  4 oz (113 g) starchy vegetable, such as peas, corn, or potatoes.  4 oz (113 g) hot cereal.  4 oz (113 g) mashed potatoes or  of a large baked potato.  4 oz (113 g) canned or frozen fruit.  4 oz (120 mL) fruit juice.  4-6 crackers.  6 chicken nuggets.  6 oz (170 g) unsweetened dry cereal.  6 oz (170 g) plain fat-free yogurt or yogurt sweetened with artificial sweeteners.  8 oz (240 mL) milk.  8 oz (170 g) fresh fruit or one small piece of fruit.  24 oz (680 g) popped  popcorn. Example of carbohydrate counting Sample meal  3 oz (85 g) chicken breast.  6 oz (170 g) brown rice.  4 oz (113 g) corn.  8 oz (240 mL) milk.  8 oz (170 g) strawberries with sugar-free whipped topping. Carbohydrate calculation 1. Identify the foods that contain carbohydrates: ? Rice. ? Corn. ? Milk. ? Strawberries. 2. Calculate how many servings you have of each food: ? 2 servings rice. ? 1 serving corn. ? 1 serving milk. ? 1 serving strawberries. 3. Multiply each number of servings by 15 g: ? 2 servings rice x 15 g = 30 g. ? 1 serving corn x 15 g = 15 g. ? 1 serving milk x 15 g = 15 g. ? 1 serving strawberries x 15 g = 15 g. 4. Add together all of the amounts to find the total grams of carbohydrates eaten: ? 30 g + 15 g +  15 g + 15 g = 75 g of carbohydrates total. Summary  Carbohydrate counting is a method of keeping track of how many carbohydrates you eat.  Eating carbohydrates naturally increases the amount of sugar (glucose) in the blood.  Counting how many carbohydrates you eat helps keep your blood glucose within normal limits, which helps you manage your diabetes.  A diet and nutrition specialist (registered dietitian) can help you make a meal plan and calculate how many carbohydrates you should have at each meal and snack. This information is not intended to replace advice given to you by your health care provider. Make sure you discuss any questions you have with your health care provider. Document Revised: 04/12/2017 Document Reviewed: 03/01/2016 Elsevier Patient Education  [place].

## 2020-05-06 ENCOUNTER — Telehealth: Payer: Medicare HMO

## 2020-05-06 NOTE — Chronic Care Management (AMB) (Signed)
Patient did not present for appointment on 05/06/20 at 1:30 PM. Will contact for rescheduling.  [name], PharmD Clinical Pharmacist [facility] at [facility] [phone]

## 2020-05-06 NOTE — Chronic Care Management (AMB) (Signed)
Scheduled appointment with patient for 05/06/20 in office with pharmacist to try the Dexcom CGM sample.

## 2020-05-10 DIAGNOSIS — R69 Illness, unspecified: Secondary | ICD-10-CM | POA: Diagnosis not present

## 2020-05-11 ENCOUNTER — Other Ambulatory Visit: Payer: Self-pay

## 2020-05-11 ENCOUNTER — Ambulatory Visit (INDEPENDENT_AMBULATORY_CARE_PROVIDER_SITE_OTHER): Payer: Medicare HMO

## 2020-05-11 DIAGNOSIS — I428 Other cardiomyopathies: Secondary | ICD-10-CM

## 2020-05-11 LAB — ECHOCARDIOGRAM COMPLETE
AR max vel: 2.55 cm2
AV Area VTI: 2.77 cm2
AV Area mean vel: 2.31 cm2
AV Mean grad: 4 mmHg
AV Peak grad: 7.7 mmHg
Ao pk vel: 1.39 m/s
Area-P 1/2: 2.6 cm2
Calc EF: 43.2 %
Single Plane A2C EF: 41.1 %
Single Plane A4C EF: 44.2 %

## 2020-05-11 MED ORDER — PERFLUTREN LIPID MICROSPHERE
1.0000 mL | INTRAVENOUS | Status: AC | PRN
  Administered 2020-05-11: 2 mL via INTRAVENOUS

## 2020-05-12 ENCOUNTER — Telehealth: Payer: Self-pay

## 2020-05-12 NOTE — Telephone Encounter (Signed)
Call to patient to review echo results.    Pt verbalized understanding and has no further questions at this time.    Advised pt to call for any further questions or concerns.  No further orders.

## 2020-05-12 NOTE — Telephone Encounter (Signed)
-----   Message from Rise Mu, PA-C sent at 05/12/2020  7:14 AM EDT ----- Echo showed normal pump function, normal wall motion, slightly stiffened heart, and a trivially leaky mitral valve.   Compared to prior echo, her pump function is improved and now normal. Continue current medical therapy.

## 2020-05-26 ENCOUNTER — Other Ambulatory Visit: Payer: Self-pay | Admitting: Family Medicine

## 2020-06-09 ENCOUNTER — Encounter: Payer: Self-pay | Admitting: Family Medicine

## 2020-06-09 ENCOUNTER — Other Ambulatory Visit: Payer: Self-pay

## 2020-06-09 ENCOUNTER — Ambulatory Visit (INDEPENDENT_AMBULATORY_CARE_PROVIDER_SITE_OTHER): Payer: Medicare HMO | Admitting: Family Medicine

## 2020-06-09 VITALS — BP 118/74 | HR 86 | Temp 97.2°F | Ht 62.75 in | Wt 157.1 lb

## 2020-06-09 DIAGNOSIS — E1122 Type 2 diabetes mellitus with diabetic chronic kidney disease: Secondary | ICD-10-CM

## 2020-06-09 DIAGNOSIS — I5022 Chronic systolic (congestive) heart failure: Secondary | ICD-10-CM

## 2020-06-09 DIAGNOSIS — N183 Chronic kidney disease, stage 3 unspecified: Secondary | ICD-10-CM

## 2020-06-09 DIAGNOSIS — E1165 Type 2 diabetes mellitus with hyperglycemia: Secondary | ICD-10-CM | POA: Diagnosis not present

## 2020-06-09 DIAGNOSIS — I10 Essential (primary) hypertension: Secondary | ICD-10-CM

## 2020-06-09 DIAGNOSIS — Z794 Long term (current) use of insulin: Secondary | ICD-10-CM

## 2020-06-09 DIAGNOSIS — IMO0002 Reserved for concepts with insufficient information to code with codable children: Secondary | ICD-10-CM

## 2020-06-09 LAB — POCT GLYCOSYLATED HEMOGLOBIN (HGB A1C): Hemoglobin A1C: 10.8 % — AB (ref 4.0–5.6)

## 2020-06-09 NOTE — Progress Notes (Signed)
Kristy Hollingshead T. Montia Haslip, MD, [place]  [facility] and [place] at [facility] [place], [address]  Phone: [phone]  FAX: [place] - 69 y.o. female  MRN [id number]  Date of Birth: 19-Feb-1951  Date: 06/09/2020  PCP: Owens Loffler, MD  Referral: Owens Loffler, MD  Chief Complaint  Patient presents with  . Diabetes    This visit occurred during the SARS-CoV-2 public health emergency.  Safety protocols were in place, including screening questions prior to the visit, additional usage of staff PPE, and extensive cleaning of exam room while observing appropriate contact time as indicated for disinfecting solutions.   Subjective:   AMOR HYLE is a 69 y.o. very pleasant female patient with Body mass index is 28.04 kg/m. who presents with the following:  Unstable type 1 diabetes.  Exacerbation with an A1c of 11.  She recently was admitted to the hospital multiple times for diabetic ketoacidosis.  Diabetes Mellitus: Tolerating Medications: yes Compliance with diet: fair, Body mass index is 28.04 kg/m. Exercise: minimal / intermittent Avg blood sugars at home: not checking Foot problems: none Hypoglycemia: none No nausea, vomitting, blurred vision, polyuria.  Lab Results  Component Value Date   HGBA1C 10.8 (A) 06/09/2020   HGBA1C 11.4 (A) 03/10/2020   HGBA1C 9.7 (H) 12/08/2019   Lab Results  Component Value Date   MICROALBUR 18.1 (H) 12/10/2018   LDLCALC 103 (H) 04/22/2019   CREATININE 0.86 04/08/2020    Wt Readings from Last 3 Encounters:  06/09/20 157 lb 1 oz (71.2 kg)  04/08/20 152 lb 8 oz (69.2 kg)  03/10/20 148 lb 8 oz (67.4 kg)    Doing Levemir and U 500 unsulin. Levemir - increased up to 30 mg a day - only 1 low blood sugar. 45 twice and levemir twice a day  Cataract pending  Has had some blood sugars to 40.  Immunization History  Administered Date(s)  Administered  . PPD Test 08/29/2019  . Td 10/02/1993, 01/06/2008      Review of Systems is noted in the HPI, as appropriate  Objective:   BP 118/74   Pulse 86   Temp (!) 97.2 F (36.2 C) (Temporal)   Ht 5' 2.75" (1.594 m)   Wt 157 lb 1 oz (71.2 kg)   SpO2 95%   BMI 28.04 kg/m   GEN: No acute distress; alert,appropriate. PULM: Breathing comfortably in no respiratory distress PSYCH: Normally interactive.  CV: RRR, no m/g/r  PULM: Normal respiratory rate, no accessory muscle use. No wheezes, crackles or rhonchi   Laboratory and Imaging Data:  Assessment and Plan:     ICD-10-CM   1. Uncontrolled type 2 diabetes mellitus with stage 3 chronic kidney disease, with long-term current use of insulin (HCC)  E11.22 POCT A1C   E11.65    N18.30    Z79.4   2. Chronic systolic CHF (congestive heart failure) (HCC)  I50.22   3. Essential hypertension  I10    Unstable insulin-dependent diabetes.  She is seen multiple endocrinologist throughout the state of [place], and none of them have really been able to help her.  She reports being more compliant now.  Social Determinants of Health   Physical Activity:  . Days of Exercise per Week: Limited . Minutes of Exercise per Session: Limited  Stress:  . Feeling of Stress :   Social Connections:  . Frequency of Communication with Friends and Family: Limited  with family   Follow-up: Return in about 3 months (around 09/08/2020) for diabetes follow-up, a1c in the office.  No orders of the defined types were placed in this encounter.  There are no discontinued medications. Orders Placed This Encounter  Procedures  . POCT A1C    Signed,  Mannie Wineland T. Gracelyn Coventry, MD   Outpatient Encounter Medications as of 06/09/2020  Medication Sig  . aspirin EC 81 MG EC tablet Take 1 tablet (81 mg total) by mouth daily.  . carvedilol (COREG) 12.5 MG tablet Take 1 tablet (12.5 mg total) by mouth 2 (two) times daily.  . chlorhexidine (PERIDEX)  0.12 % solution RINSE WITH 1 CAPFUL (15ML) FOR 30 SECONDS IN THE MORNING AND IN THE EVENING AFTER TOOTHBRUSHING DO NOT SWALLOW  . Ensure (ENSURE) Take 237 mLs by mouth daily.  Marland Kitchen FLUoxetine (PROZAC) 20 MG capsule Take 1 capsule (20 mg total) by mouth daily.  Marland Kitchen glucose blood (ONETOUCH ULTRA) test strip Use to check blood sugar up to 8 times a day.  . insulin detemir (LEVEMIR) 100 UNIT/ML injection Inject 0.27 mLs (27 Units total) into the skin 2 (two) times daily.  . insulin regular human CONCENTRATED (HUMULIN R) 500 UNIT/ML injection Inject 50 Units into the skin 2 (two) times daily with a meal.   . Insulin Syringe-Needle U-100 (B-D INS SYRINGE 0.5CC/31GX5/16) 31G X 5/16" 0.5 ML MISC USE AS DIRECTED THREE TIMES A DAY  . pantoprazole (PROTONIX) 40 MG tablet Take 1 tablet (40 mg total) by mouth daily.  . sacubitril-valsartan (ENTRESTO) 49-51 MG Take 1 tablet by mouth 2 (two) times daily.  Marland Kitchen spironolactone (ALDACTONE) 25 MG tablet Take 1 tablet (25 mg total) by mouth daily.  . traMADol (ULTRAM) 50 MG tablet TAKE ONE TABLET EVERY EIGHT HOURS AS NEEDED FOR PAIN FOR UP TO 7 DAYS  . acetaminophen (TYLENOL) 325 MG tablet Take 325-650 mg by mouth daily as needed for moderate pain or headache.  (Patient not taking: Reported on 06/09/2020)  . ivabradine (CORLANOR) 5 MG TABS tablet TAKE ONE TABLET TWICE DAILY WITH MEALS (Patient not taking: Reported on 06/09/2020)   No facility-administered encounter medications on file as of 06/09/2020.

## 2020-06-28 ENCOUNTER — Other Ambulatory Visit: Payer: Self-pay | Admitting: Family Medicine

## 2020-06-29 ENCOUNTER — Other Ambulatory Visit: Payer: Self-pay | Admitting: Cardiovascular Disease

## 2020-06-29 DIAGNOSIS — R69 Illness, unspecified: Secondary | ICD-10-CM | POA: Diagnosis not present

## 2020-07-26 ENCOUNTER — Other Ambulatory Visit: Payer: Self-pay | Admitting: Cardiovascular Disease

## 2020-08-27 ENCOUNTER — Other Ambulatory Visit: Payer: Self-pay | Admitting: Physician Assistant

## 2020-08-30 NOTE — Telephone Encounter (Signed)
Rx request sent to pharmacy.  

## 2020-08-31 DIAGNOSIS — R69 Illness, unspecified: Secondary | ICD-10-CM | POA: Diagnosis not present

## 2020-08-31 DIAGNOSIS — I509 Heart failure, unspecified: Secondary | ICD-10-CM | POA: Diagnosis not present

## 2020-08-31 DIAGNOSIS — I2510 Atherosclerotic heart disease of native coronary artery without angina pectoris: Secondary | ICD-10-CM | POA: Diagnosis not present

## 2020-08-31 DIAGNOSIS — E261 Secondary hyperaldosteronism: Secondary | ICD-10-CM | POA: Diagnosis not present

## 2020-08-31 DIAGNOSIS — H547 Unspecified visual loss: Secondary | ICD-10-CM | POA: Diagnosis not present

## 2020-08-31 DIAGNOSIS — E1165 Type 2 diabetes mellitus with hyperglycemia: Secondary | ICD-10-CM | POA: Diagnosis not present

## 2020-08-31 DIAGNOSIS — Z794 Long term (current) use of insulin: Secondary | ICD-10-CM | POA: Diagnosis not present

## 2020-08-31 DIAGNOSIS — E1142 Type 2 diabetes mellitus with diabetic polyneuropathy: Secondary | ICD-10-CM | POA: Diagnosis not present

## 2020-08-31 DIAGNOSIS — E663 Overweight: Secondary | ICD-10-CM | POA: Diagnosis not present

## 2020-08-31 DIAGNOSIS — Z008 Encounter for other general examination: Secondary | ICD-10-CM | POA: Diagnosis not present

## 2020-08-31 DIAGNOSIS — I110 Hypertensive heart disease with heart failure: Secondary | ICD-10-CM | POA: Diagnosis not present

## 2020-09-07 ENCOUNTER — Telehealth: Payer: Self-pay

## 2020-09-07 NOTE — Telephone Encounter (Signed)
Pt presented to office for a refill for Peripheral Neuropathy Cream-  Bupivacaine 1%, Doxepin 3%, Gabapentin 6%, Pentoxifylline 3%, Topiramate 1%

## 2020-09-08 ENCOUNTER — Ambulatory Visit: Payer: Medicare HMO | Admitting: Family Medicine

## 2020-09-08 DIAGNOSIS — Z0289 Encounter for other administrative examinations: Secondary | ICD-10-CM

## 2020-09-10 ENCOUNTER — Other Ambulatory Visit: Payer: Self-pay | Admitting: Cardiovascular Disease

## 2020-09-10 NOTE — Telephone Encounter (Signed)
Rx request sent to pharmacy.  

## 2020-09-28 ENCOUNTER — Other Ambulatory Visit: Payer: Self-pay | Admitting: Family Medicine

## 2020-09-28 DIAGNOSIS — R69 Illness, unspecified: Secondary | ICD-10-CM | POA: Diagnosis not present

## 2020-10-04 ENCOUNTER — Encounter: Payer: Self-pay | Admitting: Family Medicine

## 2020-10-04 ENCOUNTER — Other Ambulatory Visit: Payer: Self-pay

## 2020-10-04 ENCOUNTER — Ambulatory Visit (INDEPENDENT_AMBULATORY_CARE_PROVIDER_SITE_OTHER): Payer: Medicare HMO | Admitting: Family Medicine

## 2020-10-04 VITALS — BP 140/84 | HR 94 | Temp 97.9°F | Ht 64.5 in | Wt 163.5 lb

## 2020-10-04 DIAGNOSIS — Z79899 Other long term (current) drug therapy: Secondary | ICD-10-CM

## 2020-10-04 DIAGNOSIS — N183 Chronic kidney disease, stage 3 unspecified: Secondary | ICD-10-CM

## 2020-10-04 DIAGNOSIS — E782 Mixed hyperlipidemia: Secondary | ICD-10-CM | POA: Diagnosis not present

## 2020-10-04 DIAGNOSIS — IMO0002 Reserved for concepts with insufficient information to code with codable children: Secondary | ICD-10-CM

## 2020-10-04 DIAGNOSIS — Z794 Long term (current) use of insulin: Secondary | ICD-10-CM

## 2020-10-04 DIAGNOSIS — E1169 Type 2 diabetes mellitus with other specified complication: Secondary | ICD-10-CM | POA: Diagnosis not present

## 2020-10-04 DIAGNOSIS — Z789 Other specified health status: Secondary | ICD-10-CM

## 2020-10-04 DIAGNOSIS — Z7189 Other specified counseling: Secondary | ICD-10-CM | POA: Diagnosis not present

## 2020-10-04 DIAGNOSIS — E1122 Type 2 diabetes mellitus with diabetic chronic kidney disease: Secondary | ICD-10-CM

## 2020-10-04 DIAGNOSIS — E785 Hyperlipidemia, unspecified: Secondary | ICD-10-CM

## 2020-10-04 DIAGNOSIS — E1165 Type 2 diabetes mellitus with hyperglycemia: Secondary | ICD-10-CM | POA: Diagnosis not present

## 2020-10-04 DIAGNOSIS — I5022 Chronic systolic (congestive) heart failure: Secondary | ICD-10-CM

## 2020-10-04 DIAGNOSIS — N182 Chronic kidney disease, stage 2 (mild): Secondary | ICD-10-CM

## 2020-10-04 HISTORY — DX: Other specified health status: Z78.9

## 2020-10-04 LAB — POCT GLYCOSYLATED HEMOGLOBIN (HGB A1C): Hemoglobin A1C: 11.7 % — AB (ref 4.0–5.6)

## 2020-10-04 NOTE — Progress Notes (Unsigned)
Kristy Espino T. Kristy Defoor, MD, [place]  [facility] at [facility] [place], [address]  Phone: [phone]  FAX: [place] - 70 y.o. female  MRN [id number]  Date of Birth: 21-Nov-1950  Date: 10/04/2020  PCP: Owens Loffler, MD  Referral: Owens Loffler, MD  Chief Complaint  Patient presents with  . Diabetes    This visit occurred during the SARS-CoV-2 public health emergency.  Safety protocols were in place, including screening questions prior to the visit, additional usage of staff PPE, and extensive cleaning of exam room while observing appropriate contact time as indicated for disinfecting solutions.   Subjective:   Kristy [name] is a 70 y.o. very pleasant female patient with Body mass index is 27.63 kg/m. who presents with the following:  Diabetes Mellitus: Tolerating Medications: yes Compliance with diet: fair, Body mass index is 27.63 kg/m. Exercise: minimal / intermittent Avg blood sugars at home: not checking Foot problems: none Hypoglycemia: none No nausea, vomitting, blurred vision, polyuria.  Lab Results  Component Value Date   HGBA1C 11.7 (A) 10/04/2020   HGBA1C 10.8 (A) 06/09/2020   HGBA1C 11.4 (A) 03/10/2020   Lab Results  Component Value Date   MICROALBUR 18.1 (H) 12/10/2018   LDLCALC 103 (H) 04/22/2019   CREATININE 0.86 04/08/2020    Wt Readings from Last 3 Encounters:  10/04/20 163 lb 8 oz (74.2 kg)  06/09/20 157 lb 1 oz (71.2 kg)  04/08/20 152 lb 8 oz (69.2 kg)    Lab Results  Component Value Date   HGBA1C 11.7 (A) 10/04/2020   HGBA1C 10.8 (A) 06/09/2020   HGBA1C 11.4 (A) 03/10/2020   Lab Results  Component Value Date   MICROALBUR 18.1 (H) 12/10/2018   LDLCALC 103 (H) 04/22/2019   CREATININE 0.86 04/08/2020   AM BS was 479  Has gained some abd weight.  60 units TID, U 500 Units - she has subsequently dropped this to 40  units of U500 tid Levemir 30 units BID - the levemir has mitigated some of the low blood sugars that she was having  4-5 Endocrinologists at this point, all with minimal suggestions.  Taking all heart medicine  Stopped prozac  Patient is of sound mind.  She does want to be DNR/DNI. HCPOA documents are in patient's chart: Elmarie Shiley We had a long conversation regarding her advanced care planning, and she requests to be DNR/DNI in the future.  She would not want to be intubated.  POA: [name], brother, see problem list for telephone number.  Patient has a legal guardian in chart is not correct per patient - she does have a HCPOA. On chart review, the patient was initially involuntarily committed 09/12/2019, there is a legal document in media scanned that indicates that Ms. Dearmas was incapacitated. 09/19/2019: Paperwork for involuntary commitment 10/01/2019:  There is a 42 page document, that ultimately names her brother Elmarie Shiley as her Power of [place], not [employer] 05/11/2020, there is another document from attorney  Review of Systems is noted in the HPI, as appropriate  Objective:   BP 140/84   Pulse 94   Temp 97.9 F (36.6 C) (Temporal)   Ht 5' 4.5" (1.638 m)   Wt 163 lb 8 oz (74.2 kg)   SpO2 95%   BMI 27.63 kg/m   GEN: No acute distress; alert,appropriate. PULM: Breathing comfortably in no respiratory distress PSYCH: Normally  interactive.  CV: RRR, no m/g/r  PULM: Normal respiratory rate, no accessory muscle use. No wheezes, crackles or rhonchi   Laboratory and Imaging Data: Results for orders placed or performed in visit on 27/03/50  Basic metabolic panel  Result Value Ref Range   Sodium 136 135 - 145 mEq/L   Potassium 3.8 3.5 - 5.1 mEq/L   Chloride 99 96 - 112 mEq/L   CO2 28 19 - 32 mEq/L   Glucose, Bld 279 (H) 70 - 99 mg/dL   BUN 22 6 - 23 mg/dL   Creatinine, Ser 1.03 0.40 - 1.20 mg/dL   GFR 55.55 (L) >60.00 mL/min    Calcium 11.2 (H) 8.4 - 10.5 mg/dL  CBC with Differential/Platelet  Result Value Ref Range   WBC 9.6 4.0 - 10.5 K/uL   RBC 4.79 3.87 - 5.11 Mil/uL   Hemoglobin 14.3 12.0 - 15.0 g/dL   HCT 43.3 36.0 - 46.0 %   MCV 90.4 78.0 - 100.0 fl   MCHC 33.1 30.0 - 36.0 g/dL   RDW 13.4 11.5 - 15.5 %   Platelets 181.0 150.0 - 400.0 K/uL   Neutrophils Relative % 47.4 43.0 - 77.0 %   Lymphocytes Relative 40.8 12.0 - 46.0 %   Monocytes Relative 9.2 3.0 - 12.0 %   Eosinophils Relative 2.0 0.0 - 5.0 %   Basophils Relative 0.6 0.0 - 3.0 %   Neutro Abs 4.6 1.4 - 7.7 K/uL   Lymphs Abs 3.9 0.7 - 4.0 K/uL   Monocytes Absolute 0.9 0.1 - 1.0 K/uL   Eosinophils Absolute 0.2 0.0 - 0.7 K/uL   Basophils Absolute 0.1 0.0 - 0.1 K/uL  Hepatic function panel  Result Value Ref Range   Total Bilirubin 0.5 0.2 - 1.2 mg/dL   Bilirubin, Direct 0.1 0.0 - 0.3 mg/dL   Alkaline Phosphatase 81 39 - 117 U/L   AST 22 0 - 37 U/L   ALT 19 0 - 35 U/L   Total Protein 7.3 6.0 - 8.3 g/dL   Albumin 4.5 3.5 - 5.2 g/dL  Lipid panel  Result Value Ref Range   Cholesterol 267 (H) 0 - 200 mg/dL   Triglycerides 339.0 (H) 0.0 - 149.0 mg/dL   HDL 36.90 (L) >39.00 mg/dL   VLDL 67.8 (H) 0.0 - 40.0 mg/dL   Total CHOL/HDL Ratio 7    NonHDL 229.68   LDL cholesterol, direct  Result Value Ref Range   Direct LDL 177.0 mg/dL  POCT glycosylated hemoglobin (Hb A1C)  Result Value Ref Range   Hemoglobin A1C 11.7 (A) 4.0 - 5.6 %   HbA1c POC (<> result, manual entry)     HbA1c, POC (prediabetic range)     HbA1c, POC (controlled diabetic range)       Assessment and Plan:     ICD-10-CM   1. Uncontrolled type 2 diabetes mellitus with stage 3 chronic kidney disease, with long-term current use of insulin (HCC)  E11.22 POCT glycosylated hemoglobin (Hb A1C)   K93.81 Basic metabolic panel   W29.93 CBC with Differential/Platelet   Z79.4 Hepatic function panel    Lipid panel  2. Advanced care planning/counseling discussion: DNR / DNI  Z71.89    3. Encounter for long-term (current) use of medications  Z16.967 Basic metabolic panel    CBC with Differential/Platelet    Hepatic function panel  4. Hyperlipidemia associated with type 2 diabetes mellitus (HCC)  E11.69 Lipid panel   E78.5   5. Patient has active power of attorney for  health care  Z78.9   6. CKD stage 2 due to type 2 diabetes mellitus (HCC)  E11.22    N18.2   7. Mixed hyperlipidemia  E78.2   8. Chronic systolic CHF (congestive heart failure) (HCC)  I50.22    Total encounter time: 40 minutes. This includes total time spent on the day of encounter.  We had a very long conversation.  Chronically poorly controlled diabetes and poorly controlled hyperlipidemia.  We spent a majority of our time talking about her goals and wishes from a advanced care standpoint.  She was very clear that she would like to be DNR/DNI, and I reflected this in the chart.  At the time of our office visit she was clearly of sound mind.  She also brought up a lot of different concerns about power of attorney and healthcare power of attorney documentation.  Frankly, we had an open discussion and I am in no way an attorney or involved with any of this at all.  At her request, I did review the documentation, and my basic understanding and from a medical standpoint I think the only thing that really matters medically is the naming of her brother Elmarie Shiley as her healthcare power of attorney.  I cannot really comment on anything else, and I do not have a grasp of its legal or business implications.  Hyperlipidemia, she has not been able to tolerate statins in the past.  No orders of the defined types were placed in this encounter.  Medications Discontinued During This Encounter  Medication Reason  . LEVEMIR 100 UNIT/ML injection Duplicate  . acetaminophen (TYLENOL) 325 MG tablet No longer needed (for PRN medications)  . FLUoxetine (PROZAC) 20 MG capsule    Orders Placed This Encounter   Procedures  . Basic metabolic panel  . CBC with Differential/Platelet  . Hepatic function panel  . Lipid panel  . LDL cholesterol, direct  . POCT glycosylated hemoglobin (Hb A1C)    Follow-up: No follow-ups on file.  Signed,  Maud Deed. Katriel Cutsforth, MD   Outpatient Encounter Medications as of 10/04/2020  Medication Sig  . aspirin EC 81 MG EC tablet Take 1 tablet (81 mg total) by mouth daily.  . carvedilol (COREG) 12.5 MG tablet TAKE 1 TABLET BY MOUTH TWICE DAILY  . chlorhexidine (PERIDEX) 0.12 % solution RINSE WITH 1 CAPFUL (15ML) FOR 30 SECONDS IN THE MORNING AND IN THE EVENING AFTER TOOTHBRUSHING DO NOT SWALLOW  . Ensure (ENSURE) Take 237 mLs by mouth daily.  Marland Kitchen ENTRESTO 49-51 MG TAKE 1 TABLET BY MOUTH TWICE DAILY  . glucose blood (ONETOUCH ULTRA) test strip USE TO CHECK BLOOD SUGAR UP TO 8 TIMES ADAY  . insulin detemir (LEVEMIR) 100 UNIT/ML injection Inject 30 Units into the skin 2 (two) times daily.  . insulin regular human CONCENTRATED (HUMULIN R) 500 UNIT/ML injection Inject 60 Units into the skin 3 (three) times daily with meals.  . Insulin Syringe-Needle U-100 (B-D INS SYRINGE 0.5CC/31GX5/16) 31G X 5/16" 0.5 ML MISC USE AS DIRECTED THREE TIMES A DAY  . pantoprazole (PROTONIX) 40 MG tablet Take 1 tablet (40 mg total) by mouth daily.  Marland Kitchen spironolactone (ALDACTONE) 25 MG tablet TAKE 1 TABLET BY MOUTH ONCE DAILY  . traMADol (ULTRAM) 50 MG tablet TAKE ONE TABLET EVERY EIGHT HOURS AS NEEDED FOR PAIN FOR UP TO 7 DAYS  . [DISCONTINUED] FLUoxetine (PROZAC) 20 MG capsule Take 1 capsule (20 mg total) by mouth daily.  . ivabradine (CORLANOR) 5 MG TABS tablet  TAKE ONE TABLET TWICE DAILY WITH MEALS (Patient not taking: No sig reported)  . [DISCONTINUED] acetaminophen (TYLENOL) 325 MG tablet Take 325-650 mg by mouth daily as needed for moderate pain or headache.  (Patient not taking: Reported on 06/09/2020)  . [DISCONTINUED] LEVEMIR 100 UNIT/ML injection INJECT 27 UNITS TOTAL INTO THE SKIN TWICE  DAILY AS DIRECTED   No facility-administered encounter medications on file as of 10/04/2020.

## 2020-10-05 LAB — LIPID PANEL
Cholesterol: 267 mg/dL — ABNORMAL HIGH (ref 0–200)
HDL: 36.9 mg/dL — ABNORMAL LOW (ref >39.00)
NonHDL: 229.68
Total CHOL/HDL Ratio: 7
Triglycerides: 339 mg/dL — ABNORMAL HIGH (ref 0.0–149.0)
VLDL: 67.8 mg/dL — ABNORMAL HIGH (ref 0.0–40.0)

## 2020-10-05 LAB — BASIC METABOLIC PANEL
BUN: 22 mg/dL (ref 6–23)
CO2: 28 mEq/L (ref 19–32)
Calcium: 11.2 mg/dL — ABNORMAL HIGH (ref 8.4–10.5)
Chloride: 99 mEq/L (ref 96–112)
Creatinine, Ser: 1.03 mg/dL (ref 0.40–1.20)
GFR: 55.55 mL/min — ABNORMAL LOW (ref >60.00)
Glucose, Bld: 279 mg/dL — ABNORMAL HIGH (ref 70–99)
Potassium: 3.8 mEq/L (ref 3.5–5.1)
Sodium: 136 mEq/L (ref 135–145)

## 2020-10-05 LAB — CBC WITH DIFFERENTIAL/PLATELET
Basophils Absolute: 0.1 10*3/uL (ref 0.0–0.1)
Basophils Relative: 0.6 % (ref 0.0–3.0)
Eosinophils Absolute: 0.2 10*3/uL (ref 0.0–0.7)
Eosinophils Relative: 2 % (ref 0.0–5.0)
HCT: 43.3 % (ref 36.0–46.0)
Hemoglobin: 14.3 g/dL (ref 12.0–15.0)
Lymphocytes Relative: 40.8 % (ref 12.0–46.0)
Lymphs Abs: 3.9 10*3/uL (ref 0.7–4.0)
MCHC: 33.1 g/dL (ref 30.0–36.0)
MCV: 90.4 fl (ref 78.0–100.0)
Monocytes Absolute: 0.9 10*3/uL (ref 0.1–1.0)
Monocytes Relative: 9.2 % (ref 3.0–12.0)
Neutro Abs: 4.6 10*3/uL (ref 1.4–7.7)
Neutrophils Relative %: 47.4 % (ref 43.0–77.0)
Platelets: 181 10*3/uL (ref 150.0–400.0)
RBC: 4.79 Mil/uL (ref 3.87–5.11)
RDW: 13.4 % (ref 11.5–15.5)
WBC: 9.6 10*3/uL (ref 4.0–10.5)

## 2020-10-05 LAB — HEPATIC FUNCTION PANEL
ALT: 19 U/L (ref 0–35)
AST: 22 U/L (ref 0–37)
Albumin: 4.5 g/dL (ref 3.5–5.2)
Alkaline Phosphatase: 81 U/L (ref 39–117)
Bilirubin, Direct: 0.1 mg/dL (ref 0.0–0.3)
Total Bilirubin: 0.5 mg/dL (ref 0.2–1.2)
Total Protein: 7.3 g/dL (ref 6.0–8.3)

## 2020-10-05 LAB — LDL CHOLESTEROL, DIRECT: Direct LDL: 177 mg/dL

## 2020-10-11 NOTE — Telephone Encounter (Signed)
"  I need a refill on my compound medication.  [facility] is not open anymore.  I tried to call and get a refill but was told I needed to contact my doctor because the prescription is so old."  I will send Dr. Amalia Hailey another message.  Hopefull you will hear something soon.  "I'f I don't hear from you, I'm going to come back by here."

## 2020-10-12 NOTE — Telephone Encounter (Signed)
Per Dr. Amalia Hailey verbal order, ok to refill    New script has been called into [facility] and patient has been informed of medication and new pick up location

## 2020-11-02 ENCOUNTER — Ambulatory Visit: Payer: Medicare HMO | Admitting: Family

## 2020-11-02 ENCOUNTER — Other Ambulatory Visit: Payer: Self-pay

## 2020-11-02 ENCOUNTER — Encounter: Payer: Self-pay | Admitting: Family

## 2020-11-02 VITALS — BP 150/84 | HR 84 | Ht 63.0 in | Wt 165.0 lb

## 2020-11-02 DIAGNOSIS — I428 Other cardiomyopathies: Secondary | ICD-10-CM | POA: Diagnosis not present

## 2020-11-02 DIAGNOSIS — I10 Essential (primary) hypertension: Secondary | ICD-10-CM | POA: Diagnosis not present

## 2020-11-02 DIAGNOSIS — E782 Mixed hyperlipidemia: Secondary | ICD-10-CM | POA: Diagnosis not present

## 2020-11-02 DIAGNOSIS — M791 Myalgia, unspecified site: Secondary | ICD-10-CM

## 2020-11-02 DIAGNOSIS — I5022 Chronic systolic (congestive) heart failure: Secondary | ICD-10-CM | POA: Diagnosis not present

## 2020-11-02 DIAGNOSIS — I2510 Atherosclerotic heart disease of native coronary artery without angina pectoris: Secondary | ICD-10-CM

## 2020-11-02 MED ORDER — EZETIMIBE 10 MG PO TABS: 10.0000 mg | ORAL_TABLET | Freq: Every day | ORAL | 3 refills | Status: DC

## 2020-11-02 NOTE — Patient Instructions (Addendum)
Medication Instructions:  Your physician has recommended you make the following change in your medication:   START Ezetimibe (Zetia) 62m daily  *If you need a refill on your cardiac medications before your next appointment, please call your pharmacy*  Lab Work: Your provider [facility]. You can stop by the [facility] anytime 8am to 5pm to have this collected. You do not need an appointment.   If you have labs (blood work) drawn today and your tests are completely normal, you will receive your results only by: .Marland KitchenMyChart Message (if you have MyChart) OR . A paper copy in the mail If you have any lab test that is abnormal or we need to change your treatment, we will call you to review the results.  Testing/Procedures: Your EKG today was stable compared to previous.   Follow-Up: [facility] needs [facility] our [facility].  As part of our continuing mission to provide you with exceptional heart care, we have created designated Provider Care Teams.  These Care Teams include your primary Cardiologist (physician) and Advanced Practice Providers (APPs -  Physician Assistants and Nurse Practitioners) who all work together to provide you with the care you need, when you need it.  We recommend signing up for the patient portal called "MyChart".  Sign up information is provided on this After Visit Summary.  MyChart is used to connect with patients for Virtual Visits (Telemedicine).  Patients are able to view lab/test results, encounter notes, upcoming appointments, etc.  Non-urgent messages can be sent to your provider as well.   To learn more about what you can do with MyChart, go to [email or web address]    Your next appointment:   6 month(s)  The format for your next appointment:   In Person  Provider:   You may see TIda Rogue MD or one of the following  Advanced Practice Providers on your designated Care Team:    CMurray Hodgkins NP  RChristell Faith PA-C  JMarrianne Mood PA-C  Cadence FKathlen Mody PVermont CLaurann Montana NP  Other Instructions  Contact our office if your blood pressure is consistently more than 130/80.   If you have new or worsening chest pain or trouble with your breathing, please call our office.   Ezetimibe Tablets What is this medicine? EZETIMIBE (ez ET i mibe) treats high cholesterol. Ezetimibe blocks the absorption of cholesterol from the stomach. It is used with lifestyle changes, like diet and exercise. It may be used alone or with other medicines. This medicine may be used for other purposes; ask your health care provider or pharmacist if you have questions. COMMON BRAND NAME(S): Zetia How should I use this medicine? Take this medicine by mouth. Take it as directed on the prescription label at the same time every day. You can take it with or without food. If it upsets your stomach, take it with food. Keep taking it unless your health care provider tells you to stop. Take bile acid sequestrants at a different time of day than this medicine. Take this medicine 2 hours BEFORE or 4 hours AFTER bile acid sequestrants. Talk to your health care provider about the use of this medicine in children. While it may be prescribed for children as young as 10 for selected conditions, precautions do apply. Overdosage: If you think you have taken too much of this medicine contact a  poison control center or emergency room at once. NOTE: This medicine is only for you. Do not share this medicine with others. What if I miss a dose? If you miss a dose, take it as soon as you can. If it is almost time for your next dose, take only that dose. Do not take double or extra doses. What may interact with this medicine? Do not take this medicine with any of the following medications:  fenofibrate  gemfibrozil This medicine may also interact  with the following medications:  antacids  cyclosporine  herbal medicines like red yeast rice  other medicines to lower cholesterol or triglycerides This list may not describe all possible interactions. Give your health care provider a list of all the medicines, herbs, non-prescription drugs, or dietary supplements you use. Also tell them if you smoke, drink alcohol, or use illegal drugs. Some items may interact with your medicine.

## 2020-11-02 NOTE — Progress Notes (Signed)
Office Visit    Patient Name: Kristy [name] Date of Encounter: 11/02/2020  Primary Care Provider:  Owens Loffler, MD Primary Cardiologist:  Ida Rogue, MD Electrophysiologist:  None   Chief Complaint    Kristy [name] is a 70 y.o. female with a hx of nonobstructive coronary artery disease, HFrEF secondary to nonischemic cardiomyopathy, uncontrolled diabetes AAA, Crohn's disease, hypertension, hyperlipidemia, LBBB, prior tobacco use, gout, GERD presents today for follow-up of HFrEF  Past Medical History    Past Medical History:  Diagnosis Date  . Allergic rhinitis   . Allergy   . Cataract    mild   . Crohn's disease ([place]) 10/13/2013  . Diverticulosis   . GERD (gastroesophageal reflux disease)   . Gout   . HFrEF (heart failure with reduced ejection fraction) ([place])    a. 12/2008 Cath: EF 45% w/ inf HK; b. 07/2009 Echo: EF 50-55%; c. 08/2018 Echo: EF 20-25%.  Marland Kitchen Hyperlipidemia   . Hypertension   . IBS (irritable bowel syndrome)   . Left bundle branch block   . Neuromuscular disorder (HCC)    neuropathy  . NICM (nonischemic cardiomyopathy) ([place])    a. 12/2008 Cath: no significant dzs, EF 45% w/ inf HK->Med Rx; b. 07/2009 Echo: EF 50-55%; c. 08/2018 Echo: EF 20-25%, ant/antsept HK, mild MR, mildly dil LA, nl RV fx; d. 08/2018 Cath: D1 80, otw nonobs dzs->Med rx.  . Non-obstructive CAD (coronary artery disease)    a. 12/2008 Cath: no significant dzs, EF 45% w/ inf HK->Med Rx; b. 08/2018 Cath: LM nl, LAD min irregs, D1 80, LCX 66md, RCA min irregs->Med Rx.  . Osteoarthritis   . Patient has active power of attorney for health care: TOretha Caprice1/11/2020  . Poorly controlled Diabetes mellitus    a. 07/2018 A1c 13.1.  .Marland KitchenSymptomatic cholelithiasis   . Uncontrolled type 2 diabetes mellitus with stage 3 chronic kidney disease, with long-term current use of insulin ([facility]         Past Surgical History:  Procedure Laterality Date  . BREAST BIOPSY Right 06/24/2019   stereo  bx/ x clip/ path pending  . BREAST CYST ASPIRATION    . CARDIAC CATHETERIZATION    . COLONOSCOPY    . DILATION AND CURETTAGE OF UTERUS    . LEFT HEART CATH AND CORONARY ANGIOGRAPHY N/A 04/24/2019   Procedure: LEFT HEART CATH AND CORONARY ANGIOGRAPHY;  Surgeon: ENelva Bush MD;  [facility];  Service: Cardiovascular;  Laterality: N/A;  . RIGHT/LEFT HEART CATH AND CORONARY ANGIOGRAPHY N/A 08/26/2018   Procedure: RIGHT/LEFT HEART CATH AND CORONARY ANGIOGRAPHY;  Surgeon: AWellington Hampshire MD;  [facility];  Service: Cardiovascular;  Laterality: N/A;  . SHOULDER SURGERY     15 + yrs ago   . SKIN SURGERY     nose   . VAGINAL HYSTERECTOMY      Allergies  Allergies  Allergen Reactions  . Fish Oil Other (See Comments)    Gout  . Glimepiride Other (See Comments)    REACTION: hypoglycemia  . Guanfacine Hcl Other (See Comments)    REACTION: unspecified  . Rosiglitazone Other (See Comments)    CHF    History of Present Illness    Kristy FUTRELLis a 70y.o. female with a hx of nonobstructive coronary artery disease, HFrEF secondary to nonischemic cardiomyopathy, uncontrolled diabetes AAA, Crohn's disease, hypertension, hyperlipidemia, LBBB, prior tobacco use, gout, GERD last seen 04/08/2020 by RChristell Faith PA.  Her cardiomyopathy was initially diagnosed in 2010 with EF 45%.  Diagnostic catheterization showed minimal nonobstructive coronary disease and she was recommended for medical management.  She had recovery of LVEF later to the time of the echo showing LVEF 50-55%.  Over the years she has had difficulty controlling her diabetes and is required recurrent admissions for DKA with AKI in the past requiring intermittent sensation of heart failure therapy.  November 2019 she developed worsening dyspnea with repeat echo showing LVEF 20-25%.  Diagnostic cath again showed predominantly nonobstructive CAD with more significant stenosis in a small diagonal  branch.  Recommended to continue medical therapy.  Repeat echo 12/2018 LVEF 50 to 55%, moderate concentric LVH, diastolic dysfunction, RV normal systolic function and size, mildly dilated left atrium, no significant valvular abnormality.  Admitted 05-05-2019 with AMS with difficulty with word finding preceded by fall in the bathtub.  Found on floor by family member.  She was febrile and in DKA on admission.  Echo 04/21/2019 EF 35-40%, severe hypokinesis of mid apical anterior wall and anteroseptal wall, no significant valvular abnormalities.  Imaging of head/brain unrevealing for stroke.  She underwent diagnostic cath which showed stable appearance to coronary artery since 08/2018.  Elevated troponin was felt to be due to supply demand ischemia or stress-induced cardiomyopathy in the setting of severe DKA.  There was a focal D1 stenosis remaining at approximately 80% with mild to moderate nonobstructive disease involving LAD and LCx.  Mildly elevated LV filling pressure noted.  She has had recurrent hospitalizations for DKA.    Seen in follow-up 04/08/2020 by Christell Faith, PA.  She was doing overall well from a cardiac perspective.  She thought her ivabradine was causing blurriness in her right eye so she was taking it intermittently that was noted that she also has a cataract.  No changes were made at that time.  She presents today for follow-up.  Denies chest pain, Shikhman tightness.  Reports no shortness of breath at rest nor dyspnea on exertion.  Tells me she has stopped taking ivabradine completely as she attributes it to the blurriness in her right eye.  No edema, orthopnea, PND.  She is very pleased with her regimen of spironolactone and Entresto and tells me they make her feel "great ".  We reviewed her cholesterol panel and previous intolerance to statins.  Tells me Dr. Lorelei Pont her primary care provider recommended starting Zetia but she wanted to discuss it with our office first.   EKGs/Labs/Other Studies  Reviewed:   The following studies were reviewed today: [facility] 05-05-19: Conclusions: 1. Stable appearance of coronary arteries since 08/2018 without new lesion to explain recent decline in LVEF or troponin elevation.  I suspect elevated troponin may be due to supply-demand mismatch and/or stress-induced cardiomyopathy in the setting of severe DKA. 2. Focal D1 stenosis remains ~80%.  There is mild to moderate, non-obstructive disease involving the LAD and LCx. 3. Mildly elevated left ventricular filling pressure.   Recommendations: 1. Continue optimization of evidence-based heart failure.  Will continue carvedilol 3.125 mg BID and restart Entresto today. 2. Maintain net even fluid balance; could consider started gentle diuresis tomorrow as renal function allows. 3. Dual antiplatelet therapy with aspirin and clopidogrel for 12 months. 4. Aggressive secondary prevention. __________   2D echo 2019-05-05: 1. Severe hypokinesis of the left ventricular, mid-apical anterior wall  and anteroseptal wall.   2. The left ventricle has moderately reduced systolic function, with an  ejection fraction of 35-40%. The cavity size  was normal. Left ventricular  diastolic function could not be evaluated.   3. The right ventricle has normal systolic function. The cavity was  normal. There is mildly increased right ventricular wall thickness.   4. Left atrial size was not well visualized.   5. The aortic valve is tricuspid. Mild thickening of the aortic valve.  Aortic valve regurgitation was not assessed by color flow Doppler. Mild  aortic annular calcification noted.   6. The mitral valve was not well visualized. There is mild mitral annular  calcification present.   7. The aortic root is normal in size and structure.   8. The interatrial septum was not well visualized.   EKG:  EKG is  ordered today.  The ekg ordered today demonstrates sinus rhythm 84 bpm with stable left bundle branch block.  Recent  Labs: 10/04/2020: ALT 19; BUN 22; Creatinine, Ser 1.03; Hemoglobin 14.3; Platelets 181.0; Potassium 3.8; Sodium 136  Recent Lipid Panel    Component Value Date/Time   CHOL 267 (H) 10/04/2020 1619   TRIG 339.0 (H) 10/04/2020 1619   HDL 36.90 (L) 10/04/2020 1619   CHOLHDL 7 10/04/2020 1619   VLDL 67.8 (H) 10/04/2020 1619   LDLCALC 103 (H) 04/22/2019 0700   LDLDIRECT 177.0 10/04/2020 1619    Home Medications   Current Meds  Medication Sig  . aspirin EC 81 MG EC tablet Take 1 tablet (81 mg total) by mouth daily.  . carvedilol (COREG) 12.5 MG tablet TAKE 1 TABLET BY MOUTH TWICE DAILY  . chlorhexidine (PERIDEX) 0.12 % solution RINSE WITH 1 CAPFUL (15ML) FOR 30 SECONDS IN THE MORNING AND IN THE EVENING AFTER TOOTHBRUSHING DO NOT SWALLOW  . Ensure (ENSURE) Take 237 mLs by mouth daily.  Marland Kitchen ENTRESTO 49-51 MG TAKE 1 TABLET BY MOUTH TWICE DAILY  . ezetimibe (ZETIA) 10 MG tablet Take 1 tablet (10 mg total) by mouth daily.  Marland Kitchen glucose blood (ONETOUCH ULTRA) test strip USE TO CHECK BLOOD SUGAR UP TO 8 TIMES ADAY  . insulin detemir (LEVEMIR) 100 UNIT/ML injection Inject 30 Units into the skin 2 (two) times daily.  . insulin regular human CONCENTRATED (HUMULIN R) 500 UNIT/ML injection Inject 65 Units into the skin 3 (three) times daily with meals.  . Insulin Syringe-Needle U-100 (B-D INS SYRINGE 0.5CC/31GX5/16) 31G X 5/16" 0.5 ML MISC USE AS DIRECTED THREE TIMES A DAY  . pantoprazole (PROTONIX) 40 MG tablet Take 1 tablet (40 mg total) by mouth daily.  Marland Kitchen spironolactone (ALDACTONE) 25 MG tablet TAKE 1 TABLET BY MOUTH ONCE DAILY  . traMADol (ULTRAM) 50 MG tablet TAKE ONE TABLET EVERY EIGHT HOURS AS NEEDED FOR PAIN FOR UP TO 7 DAYS     Review of Systems   All other systems reviewed and are otherwise negative except as noted above.  Physical Exam    VS:  BP (!) 150/84 (BP Location: Left Arm, Patient Position: Sitting, Cuff Size: Normal)   Pulse 84   Ht 5' 3"  (1.6 m)   Wt 165 lb (74.8 kg)   SpO2 98%    BMI 29.23 kg/m  , BMI Body mass index is 29.23 kg/m.  Wt Readings from Last 3 Encounters:  11/02/20 165 lb (74.8 kg)  10/04/20 163 lb 8 oz (74.2 kg)  06/09/20 157 lb 1 oz (71.2 kg)    GEN: Well nourished, overweight, well developed, in no acute distress. HEENT: normal. Neck: Supple, no JVD, carotid bruits, or masses. Cardiac: RRR, no murmurs, rubs, or gallops. No clubbing, cyanosis, edema.  Radials//PT 2+ and equal bilaterally.  Respiratory:  Respirations regular and unlabored, clear to auscultation bilaterally. GI: Soft, nontender, nondistended. MS: No deformity or atrophy. Skin: Warm and dry, no rash. Neuro:  Strength and sensation are intact. Psych: Normal affect.  Assessment & Plan    1. Nonobstructive CAD-reports no anginal symptoms.  EKG today with no acute ST/T wave changes.  GDMT includes aspirin, beta-blocker.  No statin secondary to previous intolerance.  2. HFrEF secondary to nonischemic cardiomyopathy-LVEF normalized by echocardiogram 05/2020.  NYHA I.  Continue GDMT including Entresto 49 to 60 mg twice daily, spironolactone 25 mg daily.  3. Difficult to control insulin-dependent diabetes with recurrent DKA-previously followed by [facility] endocrinology.  Tells me she has seen multiple endocrinologist with few recommendations.  She is now following with her primary care provider and her most recent A1c was 11.8.  4. Hypertension-BP mildly elevated though well controlled at clinic visit with primary care provider.  Reports readings well controlled at home.  Continue present antihypertensive regimen including Entresto 49-51 mg twice daily, Coreg 12.5 mg twice daily, spironolactone 25 mg daily.  She will contact our office if her blood pressures routinely greater than 130/80.  5. Hyperlipidemia- Previous intolerance to statin with myalgias.  Would avoid usage of Nexletol/next visit due to history of gout.  She is agreeable to start Zetia 10 mg daily.  Repeat lipid panel and [facility]  Ultimately due to her history of type 1 diabetes, nonobstructive CAD she may be a good candidate for Repatha but prefers to trial Zetia first.  6. Myalgias -history of myalgias while on rosuvastatin and atorvastatin.  Will not trial additional statin.  Start Zetia, as above.  Disposition: Follow up in 6 month(s) with Dr. Rockey Situ or APP   Signed, Loel Dubonnet, NP 11/02/2020, 4:03 PM [place]

## 2020-11-03 ENCOUNTER — Other Ambulatory Visit: Payer: Self-pay | Admitting: Cardiovascular Disease

## 2020-11-03 NOTE — Telephone Encounter (Signed)
Rx request sent to pharmacy.  

## 2020-11-24 ENCOUNTER — Telehealth: Payer: Self-pay

## 2020-11-24 NOTE — Chronic Care Management (AMB) (Addendum)
Chronic Care Management Pharmacy Assistant   Name: Kristy [name]  MRN: [id number] DOB: 28-Jul-1951  Reason for Encounter: Disease State- Hypertension and Diabetes  Patient Questions:  1.  Have you seen any other providers since your last visit? Yes 2/1/22Laurann Montana, NP- Cardiology- Started Zetia 10 mg, discontinued ivabradine. 10/04/20- Dr. [name] Copland- PCP - discontinued fluoxetine. 06/09/20-  Dr. Owens Loffler- PCP 05/11/20- Dr. Harrell Gave End- Cardiology    PCP : Owens Loffler, MD  Allergies:   Allergies  Allergen Reactions   Fish Oil Other (See Comments)    Gout   Glimepiride Other (See Comments)    REACTION: hypoglycemia   Guanfacine Hcl Other (See Comments)    REACTION: unspecified   Rosiglitazone Other (See Comments)    CHF    Medications: Outpatient Encounter Medications as of 11/24/2020  Medication Sig   aspirin EC 81 MG EC tablet Take 1 tablet (81 mg total) by mouth daily.   carvedilol (COREG) 12.5 MG tablet TAKE 1 TABLET BY MOUTH TWICE DAILY   chlorhexidine (PERIDEX) 0.12 % solution RINSE WITH 1 CAPFUL (15ML) FOR 30 SECONDS IN THE MORNING AND IN THE EVENING AFTER TOOTHBRUSHING DO NOT SWALLOW   Ensure (ENSURE) Take 237 mLs by mouth daily.   ENTRESTO 49-51 MG TAKE 1 TABLET BY MOUTH TWICE DAILY   ezetimibe (ZETIA) 10 MG tablet Take 1 tablet (10 mg total) by mouth daily.   glucose blood (ONETOUCH ULTRA) test strip USE TO CHECK BLOOD SUGAR UP TO 8 TIMES ADAY   insulin detemir (LEVEMIR) 100 UNIT/ML injection Inject 30 Units into the skin 2 (two) times daily.   insulin regular human CONCENTRATED (HUMULIN R) 500 UNIT/ML injection Inject 65 Units into the skin 3 (three) times daily with meals.   Insulin Syringe-Needle U-100 (B-D INS SYRINGE 0.5CC/31GX5/16) 31G X 5/16" 0.5 ML MISC USE AS DIRECTED THREE TIMES A DAY   pantoprazole (PROTONIX) 40 MG tablet Take 1 tablet (40 mg total) by mouth daily.   spironolactone (ALDACTONE) 25 MG tablet TAKE 1 TABLET BY  MOUTH ONCE DAILY   traMADol (ULTRAM) 50 MG tablet TAKE ONE TABLET EVERY EIGHT HOURS AS NEEDED FOR PAIN FOR UP TO 7 DAYS   No facility-administered encounter medications on file as of 11/24/2020.    Current Diagnosis: Patient Active Problem List   Diagnosis Date Noted   Advanced care planning/counseling discussion: DNR / DNI 10/04/2020   Patient has active power of attorney for health care: [name] 10/04/2020   CKD stage 2 due to type 2 diabetes mellitus ([place]) 10/04/2020   DKA (diabetic ketoacidoses) 04/19/2019   NICM (nonischemic cardiomyopathy) ([place]) 66/59/9357   Chronic systolic CHF (congestive heart failure) ([place]) 08/22/2018   Coronary artery disease, non-occlusive 09/13/2016   Personal history of other malignant neoplasm of skin 12/01/2014   Crohn's disease ([place]) 10/13/2013   Major depressive disorder, recurrent, in full remission ([place]) 05/01/2011   PSORIASIS 08/02/2009   Hyperlipidemia 05/11/2009   GOUT 02/05/2009   Uncontrolled type 2 diabetes mellitus with stage 3 chronic kidney disease, with long-term current use of insulin ([place])    Essential hypertension 04/18/2007   LBBB (left bundle branch block) 04/18/2007   ALLERGIC RHINITIS 04/18/2007    Reviewed chart prior to disease state call. Spoke with patient regarding BP  Recent Office Vitals: BP Readings from Last 3 Encounters:  11/02/20 (!) 150/84  10/04/20 140/84  06/09/20 118/74   Pulse Readings from Last 3 Encounters:  11/02/20 84  10/04/20 94  06/09/20 86  Wt Readings from Last 3 Encounters:  11/02/20 165 lb (74.8 kg)  10/04/20 163 lb 8 oz (74.2 kg)  06/09/20 157 lb 1 oz (71.2 kg)     Kidney Function Lab Results  Component Value Date/Time   CREATININE 1.03 10/04/2020 04:19 PM   CREATININE 0.86 04/08/2020 02:47 PM   CREATININE 0.72 07/27/2014 09:02 AM   GFR 55.55 (L) 10/04/2020 04:19 PM   GFRNONAA 70 04/08/2020 02:47 PM   GFRNONAA 89 07/27/2014 09:02 AM   GFRAA 80 04/08/2020 02:47 PM   GFRAA >89  07/27/2014 09:02 AM    BMP Latest Ref Rng & Units 10/04/2020 04/08/2020 12/08/2019  Glucose 70 - 99 mg/dL 279(H) 350(H) 443(H)  BUN 6 - 23 mg/dL 22 16 22   Creatinine 0.40 - 1.20 mg/dL 1.03 0.86 1.15  BUN/Creat Ratio 12 - 28 - 19 -  Sodium 135 - 145 mEq/L 136 136 135  Potassium 3.5 - 5.1 mEq/L 3.8 4.8 4.5  Chloride 96 - 112 mEq/L 99 97 100  CO2 19 - 32 mEq/L 28 22 26   Calcium 8.4 - 10.5 mg/dL 11.2(H) 10.7(H) 9.8   Current antihypertensive regimen:  Entresto 49-55m 1 tablet daily  (for CHF) Carvedilol 12.5 mg - 1 tablet twice daily Spironolactone 25 mg - 1 tablet daily  How often are you checking your Blood Pressure? 1-2x per week   Current home BP readings: no pulse reading or date.  130/86  What recent interventions/DTPs have been made by any provider to improve Blood Pressure control since last CPP Visit: No recent interventions.  Any recent hospitalizations or ED visits since last visit with CPP?  Patient states she spent 1 month in the hospital and then was committed to a nursing facility involuntarily.   What diet changes have been made to improve Blood Pressure Control?  No recent diet changes. States that she watches what she eats very closely. Limits salt and avoids carbs  What exercise is being done to improve your Blood Pressure Control?  Patient states she tries to walk about 1 hour every day.   Adherence Review: Is the patient currently on ACE/ARB medication? Yes Does the patient have >5 day gap between last estimated fill dates? CPP to review   Recent Relevant Labs: Lab Results  Component Value Date/Time   HGBA1C 11.7 (A) 10/04/2020 02:43 PM   HGBA1C 10.8 (A) 06/09/2020 11:30 AM   HGBA1C 9.7 (H) 12/08/2019 03:00 PM   HGBA1C 10.9 (H) 07/08/2019 03:07 PM   HGBA1C >14.0 12/02/2018 12:00 AM   MICROALBUR 18.1 (H) 12/10/2018 10:08 AM   MICROALBUR 36.0 (H) 01/10/2017 03:15 PM    Kidney Function Lab Results  Component Value Date/Time   CREATININE 1.03 10/04/2020  04:19 PM   CREATININE 0.86 04/08/2020 02:47 PM   CREATININE 0.72 07/27/2014 09:02 AM   GFR 55.55 (L) 10/04/2020 04:19 PM   GFRNONAA 70 04/08/2020 02:47 PM   GFRNONAA 89 07/27/2014 09:02 AM   GFRAA 80 04/08/2020 02:47 PM   GFRAA >89 07/27/2014 09:02 AM    Current antihyperglycemic regimen:  Humulin R U-500 - Inject 60 units at breakfast and supper (additional 20 units at bedtime if BG > 400) Levemir - Inject 30 units twice daily with breakfast and supper   (Patient stated she was taking both of the above medications)  What recent interventions/DTPs have been made to improve glycemic control:  CPP recommend treating hypoglycemia with 15 gm of fast acting carbohydrates such as 3-4 glucose tablets, 1/2 cup of fruit juice, 1  tablespoon of sugar or honey CPP recommend CGM monitoring   Patient denies hypoglycemic symptoms, including Pale, Sweaty, Shaky, Hungry, Nervous/irritable and Vision changes   Patient denies hyperglycemic symptoms, including blurry vision, excessive thirst, fatigue, polyuria and weakness   How often are you checking your blood sugar? 3-4 times daily   What are your blood sugars ranging? Patient out shopping and does not have readings with her. States she does not normally write readings down. Fasting:  11/28/20- 107- states this was very low for her Before meals: N/A After meals: N/A Bedtime: N/A  During the week, how often does your blood glucose drop below 70? Never   Are you checking your feet daily/regularly? Yes- feet look ok.   Adherence Review: Is the patient currently on a STATIN medication? No Is the patient currently on ACE/ARB medication? Yes Does the patient have >5 day gap between last estimated fill dates? CPP to review  Follow up appointment scheduled with [name] 01/10/21 at 2:45. Patient aware to keep track of BP and BG readings until then. Patient also aware to have medications available for review.   Follow-Up:  Pharmacist Review and  Scheduled Follow-Up With Clinical Pharmacist  [name], CPP notified  Margaretmary Dys, [place] [phone]  I have reviewed the care management and care coordination activities outlined in this encounter and I am certifying that I agree with the content of this note. No further action required.  [name], PharmD Clinical Pharmacist [facility] [phone]

## 2020-12-30 ENCOUNTER — Other Ambulatory Visit: Payer: Self-pay

## 2020-12-30 ENCOUNTER — Encounter: Payer: Self-pay | Admitting: Obstetrics & Gynecology

## 2020-12-30 ENCOUNTER — Ambulatory Visit: Payer: Medicare HMO | Admitting: Obstetrics & Gynecology

## 2020-12-30 VITALS — BP 134/78 | HR 87 | Wt 167.0 lb

## 2020-12-30 DIAGNOSIS — N952 Postmenopausal atrophic vaginitis: Secondary | ICD-10-CM | POA: Diagnosis not present

## 2020-12-30 NOTE — Patient Instructions (Signed)
Two hyaluronic acid vaginal moisturizers  HYALO GYN is a personal lubricant intended to moisturize and lubricate to enhance the ease and comfort of intimate sexual activity and supplement the body's natural lubrication. It is available in a colorless, odorless, transparent, aqueous, hydrating gel and a moisturizing suppository, both hormone-free and made without parabens. HYALO GYN acts as a moisturizer and lubricant because of the strong hydrating properties of its proprietary hyaluronic acid derivative component, Hydeal-D.  Revaree provides powerful, hormone-free relief from vaginal dryness, with an easy-to-use vaginal insert that renews your body's moisture for everyday comfort and intimacy.

## 2020-12-30 NOTE — Progress Notes (Signed)
GYNECOLOGY OFFICE VISIT NOTE  History:   Kristy [name] is a 70 y.o. G0 here today for evaluation and management of vaginal atrophy.  Had not intercourse in about 6 years, and now has new partner and now has noticed she has "vaginal atrophy".  She was able to have intercourse but would like to have things checked out.  Extensive cardiac history, also history of hysterectomy for benign reasons many years ago.  She denies any current  abnormal vaginal discharge, bleeding, pelvic pain or other concerns.    Past Medical History:  Diagnosis Date  . Allergic rhinitis   . Allergy   . Cataract    mild   . Crohn's disease ([place]) 10/13/2013  . Diverticulosis   . GERD (gastroesophageal reflux disease)   . Gout   . HFrEF (heart failure with reduced ejection fraction) ([place])    a. 12/2008 Cath: EF 45% w/ inf HK; b. 07/2009 Echo: EF 50-55%; c. 08/2018 Echo: EF 20-25%.  Marland Kitchen Hyperlipidemia   . Hypertension   . IBS (irritable bowel syndrome)   . Left bundle branch block   . Neuromuscular disorder (HCC)    neuropathy  . NICM (nonischemic cardiomyopathy) ([place])    a. 12/2008 Cath: no significant dzs, EF 45% w/ inf HK->Med Rx; b. 07/2009 Echo: EF 50-55%; c. 08/2018 Echo: EF 20-25%, ant/antsept HK, mild MR, mildly dil LA, nl RV fx; d. 08/2018 Cath: D1 80, otw nonobs dzs->Med rx.  . Non-obstructive CAD (coronary artery disease)    a. 12/2008 Cath: no significant dzs, EF 45% w/ inf HK->Med Rx; b. 08/2018 Cath: LM nl, LAD min irregs, D1 80, LCX 68md, RCA min irregs->Med Rx.  . Osteoarthritis   . Patient has active power of attorney for health care: TOretha Caprice1/11/2020  . Poorly controlled Diabetes mellitus    a. 07/2018 A1c 13.1.  .Marland KitchenSymptomatic cholelithiasis   . Uncontrolled type 2 diabetes mellitus with stage 3 chronic kidney disease, with long-term current use of insulin ([place]          Past Surgical History:  Procedure Laterality Date  . BREAST BIOPSY Right 06/24/2019   stereo bx/ x clip/ path  pending  . BREAST CYST ASPIRATION    . CARDIAC CATHETERIZATION    . COLONOSCOPY    . DILATION AND CURETTAGE OF UTERUS    . LEFT HEART CATH AND CORONARY ANGIOGRAPHY N/A 04/24/2019   Procedure: LEFT HEART CATH AND CORONARY ANGIOGRAPHY;  Surgeon: ENelva Bush MD;  [facility];  Service: Cardiovascular;  Laterality: N/A;  . RIGHT/LEFT HEART CATH AND CORONARY ANGIOGRAPHY N/A 08/26/2018   Procedure: RIGHT/LEFT HEART CATH AND CORONARY ANGIOGRAPHY;  Surgeon: AWellington Hampshire MD;  [facility];  Service: Cardiovascular;  Laterality: N/A;  . SHOULDER SURGERY     15 + yrs ago   . SKIN SURGERY     nose   . VAGINAL HYSTERECTOMY      The following portions of the patient's history were reviewed and updated as appropriate: allergies, current medications, past family history, past medical history, past social history, past surgical history and problem list.   Health Maintenance:  Normal mammogram on 06/26/2019.   Review of Systems:  Pertinent items noted in HPI and remainder of comprehensive ROS otherwise negative.  Physical Exam:  BP 134/78   Pulse 87   Wt 167 lb (75.8 kg)   BMI 29.58 kg/m  CONSTITUTIONAL: Well-developed, well-nourished female in no acute distress.  HEENT:  Normocephalic,  atraumatic. External right and left ear normal. No scleral icterus.  NECK: Normal range of motion, supple, no masses noted on observation SKIN: No rash noted. Not diaphoretic. No erythema. No pallor. MUSCULOSKELETAL: Normal range of motion. No edema noted. NEUROLOGIC: Alert and oriented to person, place, and time. Normal muscle tone coordination. No cranial nerve deficit noted. PSYCHIATRIC: Normal mood and affect. Normal behavior. Normal judgment and thought content. CARDIOVASCULAR: Normal heart rate noted RESPIRATORY: Effort and breath sounds normal, no problems with respiration noted ABDOMEN: No masses noted. No other overt distention noted.   PELVIC: Atrophic  external genitalia with small 2 cm introitus; atrophic urethral meatus; atrophic vaginal mucosa,  No abnormal discharge noted.  Performed in the presence of a chaperone     Assessment and Plan:      Vaginal atrophy Discussed management of this in detail, recommended water/silicone based hypoallergenic lubricants and hyaluronic acid vaginal moisturizers. Not an ideal candidate for vaginal estrogen therapy given her cardiac issues. Also recommended progressive dilator usage to help with gradual widening of her introitus.  Will follow up in one month to see her progress. Routine preventative health maintenance measures emphasized. Please refer to After Visit Summary for other counseling recommendations.   Return in about 1 month (around 01/29/2021) for Follow up vaginal atrophy, can be virtual.    I spent 20 minutes dedicated to the care of this patient including pre-visit review of records, face to face time with the patient discussing her conditions and treatments and post visit ordering of testing.    Verita Schneiders, MD, [place] for [employer], [place]

## 2021-01-10 ENCOUNTER — Telehealth: Payer: Self-pay

## 2021-01-10 ENCOUNTER — Other Ambulatory Visit: Payer: Self-pay

## 2021-01-10 ENCOUNTER — Ambulatory Visit (INDEPENDENT_AMBULATORY_CARE_PROVIDER_SITE_OTHER): Payer: Medicare HMO

## 2021-01-10 DIAGNOSIS — E1122 Type 2 diabetes mellitus with diabetic chronic kidney disease: Secondary | ICD-10-CM | POA: Diagnosis not present

## 2021-01-10 DIAGNOSIS — E1165 Type 2 diabetes mellitus with hyperglycemia: Secondary | ICD-10-CM

## 2021-01-10 DIAGNOSIS — Z794 Long term (current) use of insulin: Secondary | ICD-10-CM

## 2021-01-10 DIAGNOSIS — I10 Essential (primary) hypertension: Secondary | ICD-10-CM

## 2021-01-10 DIAGNOSIS — IMO0002 Reserved for concepts with insufficient information to code with codable children: Secondary | ICD-10-CM

## 2021-01-10 DIAGNOSIS — N183 Chronic kidney disease, stage 3 unspecified: Secondary | ICD-10-CM

## 2021-01-10 NOTE — Patient Instructions (Signed)
January 10, 2021  Dear Kristy [name],  It was a pleasure meeting you during our initial appointment on January 10, 2021. Below is a summary of the goals we discussed and components of chronic care management. Please contact me anytime with questions or concerns.   Visit Information  Patient Care Plan: General Pharmacy (Adult)    Problem Identified: Disease Management   Priority: High  Note:    Current Barriers:  . Unable to achieve control of diabetes   Pharmacist Clinical Goal(s):  Marland Kitchen Patient will contact provider office for questions/concerns as evidenced notation of same in electronic health record through collaboration with PharmD and provider.   Interventions: . 1:1 collaboration with Owens Loffler, MD regarding development and update of comprehensive plan of care as evidenced by provider attestation and co-signature . Inter-disciplinary care team collaboration (see longitudinal plan of care) . Comprehensive medication review performed; medication list updated in electronic medical record  Hyperlipidemia/CAD: (LDL goal < 70) -Not ideally controlled - statin and zetia intolerant -Current treatment: . Ezetimibe 10 mg - 1 tablet daily (not taking - started 11/02/20) . Aspirin 81 mg - 1 tablet daily  -Medications previously tried: statins - multiple intolerances -Reports she was unable to tolerate Zetia after 5 days - not taking -Exercise: walking routinely, laps around [place] or outside 2 blocks twice a day -Educated on Cholesterol goals;  Exercise goal of 150 minutes per week; -Counseled on diet and exercise extensively; Follow up with cardiology this month as planned/consider Repatha.  Hypertension (BP goal <130/80) -Not ideally controlled  - per clinic readings above goal -Comorbid HFrEF followed by cardiology -Current treatment: . Carvedilol 12.5 mg - 1 tablet twice daily . Spironolactone 25 mg - 1 tablet daily  . Entresto 49-51 mg - 1 tablet twice  daily -Medications previously tried: none  -Current home readings: pt plans to bring in log to office tomorrow -Current dietary habits: pt working on low salt diet  -Denies hypotensive/hypertensive symptoms -Educated on BP goals and benefits of medications for prevention of heart attack, stroke and kidney damage; Daily salt intake goal < 2300 mg; -Counseled to monitor BP at home daily, document, and provide log at future appointments -Recommended to continue current medication  Diabetes (A1c goal <8%) -Uncontrolled - A1c 11.7% -She is happy with current control. Not following with endocrinology at this time. -Current medications: . Levemir 100 units/mL  - Inject 30 units twice daily  . Regular insulin - Inject 65 units four times daily  -Medications previously tried: Per chart, patient has tried these meds in past: Lantus - not covered by insurance/pt reports Lantus is ineffective, Avandia - HF, glimepiride - hypoglycemia, metformin - unknown, Invokana - increased thirst, Bydureon - did well but cost concern -Current home glucose readings - checks 10x/day, states she will bring log by office tomorrow  . fasting glucose: 170 (this morning) . post prandial glucose: 200s . Still having random highs in 500s around bedtime occasionally. . Denies recent lows. She usually has symptoms when BG is in low 100s. -Denies hypoglycemic/hyperglycemic symptoms -She is still interested in [place] on Prevention and management of hypoglycemic episodes; -Counseled to check feet daily and get yearly eye exams - DUE for foot exam -Recommended to continue current medication   Other: Pt denies taking PPI at this time. Occasional use of Tums PRN.  Patient Goals/Self-Care Activities . Patient will:  - take medications as prescribed check glucose daily before meals, 2 hours after meals, and bedtime,  document, and provide at future appointments check blood pressure daily, document,  and provide at future appointments  Follow Up Plan: The care management team will reach out to the patient again over the next 30 days. Once patient brings in BG and BP logs, we will schedule follow up to discuss.      The patient verbalized understanding of instructions, educational materials, and care plan provided today and agreed to receive a mailed copy of patient instructions, educational materials, and care plan.   [name], PharmD Clinical Pharmacist [facility] at [facility] [phone]

## 2021-01-10 NOTE — Telephone Encounter (Signed)
Patient is interested in [employer]. Please contact for education/sample application.  [name], PharmD Clinical Pharmacist [facility] at [facility] [phone]

## 2021-01-10 NOTE — Progress Notes (Signed)
Chronic Care Management Pharmacy Note  01/10/2021 Name:  Kristy [name] MRN:  [id number] DOB:  1951/06/06  Subjective: Kristy [name] is an 70 y.o. year old female who is a primary patient of Copland, [name], MD.  The CCM team was consulted for assistance with disease management and care coordination needs.    Engaged with patient by telephone for follow up visit in response to provider referral for pharmacy case management and/or care coordination services.   Consent to Services:  The patient was given information about Chronic Care Management services, agreed to services, and gave verbal consent prior to initiation of services.  Please see initial visit note for detailed documentation.   Patient Care Team: Owens Loffler, MD as PCP - General (Family Medicine) Rockey Situ Kathlene November, MD as PCP - Cardiology (Cardiology) Minna Merritts, MD as Consulting Physician (Cardiology) [name], [facility] as Pharmacist (Pharmacist)  Recent office visits: 10/04/20- Dr. [name] Copland- PCP - discontinued fluoxetine. 06/09/20-  Dr. Owens Loffler- PCP  Recent consult visits: 12/30/20 - Gynecology - Vaginal atrophy -  water/silicone based hypoallergenic lubricants and hyaluronic acid vaginal moisturizers. RTC 1 month.  11/02/20- Laurann [place], NP- Cardiology- Started Zetia 10 mg, discontinued ivabradine. 05/11/20- Dr. Harrell Gave End- Cardiology   Hospital visits: None in previous 6 months  Objective:  Lab Results  Component Value Date   CREATININE 1.03 10/04/2020   BUN 22 10/04/2020   GFR 55.55 (L) 10/04/2020   GFRNONAA 70 04/08/2020   GFRAA 80 04/08/2020   NA 136 10/04/2020   K 3.8 10/04/2020   CALCIUM 11.2 (H) 10/04/2020   CO2 28 10/04/2020   GLUCOSE 279 (H) 10/04/2020    Lab Results  Component Value Date/Time   HGBA1C 11.7 (A) 10/04/2020 02:43 PM   HGBA1C 10.8 (A) 06/09/2020 11:30 AM   HGBA1C 9.7 (H) 12/08/2019 03:00 PM   HGBA1C 10.9 (H) 07/08/2019 03:07 PM   HGBA1C  >14.0 12/02/2018 12:00 AM   GFR 55.55 (L) 10/04/2020 04:19 PM   GFR 46.85 (L) 12/08/2019 03:00 PM   MICROALBUR 18.1 (H) 12/10/2018 10:08 AM   MICROALBUR 36.0 (H) 01/10/2017 03:15 PM    Last diabetic Eye exam:  Lab Results  Component Value Date/Time   HMDIABEYEEXA Retinopathy (A) 04/06/2020 12:00 AM    Last diabetic Foot exam:  03/2019 podiatry  Lab Results  Component Value Date   CHOL 267 (H) 10/04/2020   HDL 36.90 (L) 10/04/2020   LDLCALC 103 (H) 04/22/2019   LDLDIRECT 177.0 10/04/2020   TRIG 339.0 (H) 10/04/2020   CHOLHDL 7 10/04/2020    Hepatic Function Latest Ref Rng & Units 10/04/2020 12/08/2019 08/21/2019  Total Protein 6.0 - 8.3 g/dL 7.3 6.8 8.3(H)  Albumin 3.5 - 5.2 g/dL 4.5 3.9 3.5  AST 0 - 37 U/L _0 ALT 0 - 35 U/L _1 Alk Phosphatase 39 - 117 U/L 81 103 245(H)  Total Bilirubin 0.2 - 1.2 mg/dL 0.5 0.4 1.3(H)  Bilirubin, Direct 0.0 - 0.3 mg/dL 0.1 0.1 -   Lab Results  Component Value Date/Time   TSH 1.325 08/07/2019 01:21 PM   TSH 0.586 04/19/2019 06:54 PM   TSH 3.88 07/11/2018 08:46 AM   TSH 1.13 01/10/2017 03:15 PM   FREET4 1.04 04/19/2019 06:54 PM   FREET4 0.98 09/07/2014 02:43 PM    CBC Latest Ref Rng & Units 10/04/2020 12/08/2019 08/29/2019  WBC 4.0 - 10.5 K/uL 9.6 10.9(H) 10.0  Hemoglobin 12.0 - 15.0 g/dL 14.3 13.0 11.1(L)  Hematocrit  36.0 - 46.0 % 43.3 40.4 33.4(L)  Platelets 150.0 - 400.0 K/uL 181.0 196.0 137(L)   Clinical ASCVD: Yes  The ASCVD Risk score Mikey Bussing DC Jr., et al., 2013) failed to calculate for the following reasons:   The patient has a prior MI or stroke diagnosis    Depression screen [facility] 2/9 07/31/2019 02/12/2019 07/27/2017  Decreased Interest 0 3 0  Down, Depressed, Hopeless 2 0 0  PHQ - 2 Score 2 3 0  Altered sleeping 1 0 -  Tired, decreased energy 1 3 -  Change in appetite 1 3 -  Feeling bad or failure about yourself  0 0 -  Trouble concentrating 0 0 -  Moving slowly or fidgety/restless 0 2 -  Suicidal thoughts 0 0 -   PHQ-9 Score 5 11 -  Difficult doing work/chores - Not difficult at all -  Some recent data might be hidden    Social History   Tobacco Use  Smoking Status Former Smoker  . Packs/day: 0.75  . Years: 30.00  . Pack years: 22.50  . Types: Cigarettes  . Quit date: 06/06/1997  . Years since quitting: 23.6  Smokeless Tobacco Never Used   BP Readings from Last 3 Encounters:  12/30/20 134/78  11/02/20 (!) 150/84  10/04/20 140/84   Pulse Readings from Last 3 Encounters:  12/30/20 87  11/02/20 84  10/04/20 94   Wt Readings from Last 3 Encounters:  12/30/20 167 lb (75.8 kg)  11/02/20 165 lb (74.8 kg)  10/04/20 163 lb 8 oz (74.2 kg)   BMI Readings from Last 3 Encounters:  12/30/20 29.58 kg/m  11/02/20 29.23 kg/m  10/04/20 27.63 kg/m    Assessment/Interventions: Review of patient past medical history, allergies, medications, health status, including review of consultants reports, laboratory and other test data, was performed as part of comprehensive evaluation and provision of chronic care management services.   SDOH:  (Social Determinants of Health) assessments and interventions performed: Yes SDOH Interventions   Flowsheet Row Most Recent Value  SDOH Interventions   Financial Strain Interventions Intervention Not Indicated  [Meds affordable]     SDOH Screenings   Alcohol Screen: Not on file  Depression (JSR1-5): Not on file  Financial Resource Strain: Low Risk   . Difficulty of Paying Living Expenses: Not very hard  Food Insecurity: Not on file  Housing: Not on file  Physical Activity: Not on file  Social Connections: Not on file  Stress: Not on file  Tobacco Use: Medium Risk  . Smoking Tobacco Use: Former Smoker  . Smokeless Tobacco Use: Never Used  Transportation Needs: Not on file    CCM Care Plan  Allergies  Allergen Reactions  . Fish Oil Other (See Comments)    Gout  . Glimepiride Other (See Comments)    REACTION: hypoglycemia  . Guanfacine Hcl Other  (See Comments)    REACTION: unspecified  . Rosiglitazone Other (See Comments)    CHF    Medications Reviewed Today    Reviewed by [name], [facility] (Pharmacist) on 01/10/21 at 1600  Med List Status: <None>  Medication Order Taking? Sig Documenting Provider Last Dose Status Informant  aspirin EC 81 MG EC tablet [id number] Yes Take 1 tablet (81 mg total) by mouth daily. Hillary Bow, MD Taking Active Other  carvedilol (COREG) 12.5 MG tablet [id number] Yes TAKE 1 TABLET BY MOUTH TWICE DAILY Gollan, Kathlene November, MD Taking Active   chlorhexidine (PERIDEX) 0.12 % solution [id number] Yes RINSE WITH 1 CAPFUL (15ML) FOR  30 SECONDS IN THE MORNING AND IN THE EVENING AFTER TOOTHBRUSHING DO NOT SWALLOW [provider] Taking Active   Ensure [facility]) [id number] Yes Take 237 mLs by mouth daily. [provider] Taking Active Other  ENTRESTO 49-51 MG [id number] Yes TAKE 1 TABLET BY MOUTH TWICE DAILY End, Harrell Gave, MD Taking Active   ezetimibe (ZETIA) 10 MG tablet [id number] No Take 1 tablet (10 mg total) by mouth daily.  Patient not taking: Reported on 01/10/2021   Loel Dubonnet, NP Not Taking Active   glucose blood (ONETOUCH ULTRA) test strip [id number] Yes USE TO CHECK BLOOD SUGAR UP TO 8 TIMES ADAY Copland, Spencer, MD Taking Active   insulin detemir (LEVEMIR) 100 UNIT/ML injection [id number] Yes Inject 30 Units into the skin 2 (two) times daily. [provider] Taking Active   insulin regular human CONCENTRATED (HUMULIN R) 500 UNIT/ML injection [id number] Yes Inject 65 Units into the skin 3 (three) times daily with meals. [provider] Taking Active   Insulin Syringe-Needle U-100 (B-D INS SYRINGE 0.5CC/31GX5/16) 31G X 5/16" 0.5 ML MISC [id number] Yes USE AS DIRECTED THREE TIMES A DAY Copland, Spencer, MD Taking Active Other       Patient not taking:      Discontinued 01/10/21 1600 (No longer needed (for PRN medications))   spironolactone (ALDACTONE) 25 MG tablet  [id number] Yes TAKE 1 TABLET BY MOUTH ONCE DAILY End, Harrell Gave, MD Taking Active           Patient Active Problem List   Diagnosis Date Noted  . Advanced care planning/counseling discussion: DNR / DNI 10/04/2020  . Patient has active power of attorney for health care: Todd Peeler 10/04/2020  . CKD stage 2 due to type 2 diabetes mellitus ([place]) 10/04/2020  . DKA (diabetic ketoacidoses) 04/19/2019  . NICM (nonischemic cardiomyopathy) ([place]) 11/08/2018  . Chronic systolic CHF (congestive heart failure) ([place]) 08/22/2018  . Coronary artery disease, non-occlusive 09/13/2016  . Personal history of other malignant neoplasm of skin 12/01/2014  . Crohn's disease ([place]) 10/13/2013  . Major depressive disorder, recurrent, in full remission ([place]) 05/01/2011  . PSORIASIS 08/02/2009  . Hyperlipidemia 05/11/2009  . GOUT 02/05/2009  . Uncontrolled type 2 diabetes mellitus with stage 3 chronic kidney disease, with long-term current use of insulin ([place])   . Essential hypertension 04/18/2007  . LBBB (left bundle branch block) 04/18/2007  . ALLERGIC RHINITIS 04/18/2007    Immunization History  Administered Date(s) Administered  . PPD Test 08/29/2019  . Td 10/02/1993, 01/06/2008    Conditions to be addressed/monitored:  Hypertension and Diabetes  Care Plan : General Pharmacy (Adult)  Updates made by [name], [facility] since 01/10/2021 12:00 AM    Problem: Disease Management   Priority: High  Note:    Current Barriers:  . Unable to achieve control of diabetes   Pharmacist Clinical Goal(s):  Marland Kitchen Patient will contact provider office for questions/concerns as evidenced notation of same in electronic health record through collaboration with PharmD and provider.   Interventions: . 1:1 collaboration with Owens Loffler, MD regarding development and update of comprehensive plan of care as evidenced by provider attestation and co-signature . Inter-disciplinary care team collaboration (see  longitudinal plan of care) . Comprehensive medication review performed; medication list updated in electronic medical record  Hyperlipidemia/CAD: (LDL goal < 70) -Not ideally controlled - statin and zetia intolerant -Current treatment: . Ezetimibe 10 mg - 1 tablet daily (not taking - started 11/02/20) . Aspirin 81 mg - 1 tablet daily  -Medications previously  tried: statins - multiple intolerances -Reports she was unable to tolerate Zetia after 5 days - not taking -Exercise: walking routinely, laps around [place] or outside 2 blocks twice a day -Educated on Cholesterol goals;  Exercise goal of 150 minutes per week; -Counseled on diet and exercise extensively; Follow up with cardiology this month as planned/consider Repatha.  Hypertension (BP goal <130/80) -Not ideally controlled  - per clinic readings above goal -Comorbid HFrEF followed by cardiology -Current treatment: . Carvedilol 12.5 mg - 1 tablet twice daily . Spironolactone 25 mg - 1 tablet daily  . Entresto 49-51 mg - 1 tablet twice daily -Medications previously tried: none  -Current home readings: pt plans to bring in log to office tomorrow -Current dietary habits: pt working on low salt diet  -Denies hypotensive/hypertensive symptoms -Educated on BP goals and benefits of medications for prevention of heart attack, stroke and kidney damage; Daily salt intake goal < 2300 mg; -Counseled to monitor BP at home daily, document, and provide log at future appointments -Recommended to continue current medication  Diabetes (A1c goal <8%) -Uncontrolled - A1c 11.7% -She is happy with current control. Not following with endocrinology at this time. -Current medications: . Levemir 100 units/mL  - Inject 30 units twice daily  . Regular insulin - Inject 65 units four times daily  -Medications previously tried: Per chart, patient has tried these meds in past: Lantus - not covered by insurance/pt reports Lantus is ineffective, Avandia - HF,  glimepiride - hypoglycemia, metformin - unknown, Invokana - increased thirst, Bydureon - did well but cost concern -Current home glucose readings - checks 10x/day, states she will bring log by office tomorrow  . fasting glucose: 170 (this morning) . post prandial glucose: 200s . Still having random highs in 500s around bedtime occasionally. . Denies recent lows. She usually has symptoms when BG is in low 100s. -Denies hypoglycemic/hyperglycemic symptoms -She is still interested in [place] on Prevention and management of hypoglycemic episodes; -Counseled to check feet daily and get yearly eye exams - DUE for foot exam -Recommended to continue current medication   Other: Pt denies taking PPI at this time. Occasional use of Tums PRN.  Patient Goals/Self-Care Activities . Patient will:  - take medications as prescribed check glucose daily before meals, 2 hours after meals, and bedtime, document, and provide at future appointments check blood pressure daily, document, and provide at future appointments  Follow Up Plan: The care management team will reach out to the patient again over the next 30 days. Once patient brings in BG and BP logs, we will schedule follow up to discuss.     Medication Assistance: None required.  Patient affirms current coverage meets needs.  Patient's preferred pharmacy is: [facility] - [place], [place] - [address] [place] [address] Phone: [phone] Fax: [phone]  [facility] [place], [place] - [address] [place] [address] Phone: [phone] Fax: [phone]  Primary pharmacy is [facility]. She confirms adherence to her medications. Her refills are timely.  Care Plan and Follow Up Patient Decision:  Patient agrees to Care Plan and Follow-up.  [name], PharmD Clinical Pharmacist [facility] at  [facility] [phone]

## 2021-01-10 NOTE — Progress Notes (Signed)
I have personally reviewed this encounter including the documentation in this note and have collaborated with the care management provider regarding care management and care coordination activities to include development and update of the comprehensive care plan. I am certifying that I agree with the content of this note and encounter as supervising physician.

## 2021-01-11 NOTE — Telephone Encounter (Signed)
Forwarding to Anisha to contact and schedule pt

## 2021-01-11 NOTE — Telephone Encounter (Signed)
Patient came by office to drop off paperwork [facility] requested. I let her know Cardell Peach was trying to contact her.  Patient can be reached at [phone].

## 2021-01-11 NOTE — Telephone Encounter (Signed)
Attempted to call patient, but was unable to LVM D/T VM box not being set up.

## 2021-01-12 NOTE — Telephone Encounter (Signed)
Attempted to call patient as before.

## 2021-01-13 NOTE — Telephone Encounter (Signed)
Patient scheduled for 4/20.

## 2021-01-18 ENCOUNTER — Other Ambulatory Visit: Payer: Self-pay | Admitting: Internal Medicine

## 2021-01-19 ENCOUNTER — Other Ambulatory Visit: Payer: Self-pay

## 2021-01-19 ENCOUNTER — Ambulatory Visit: Payer: Medicare HMO

## 2021-01-24 ENCOUNTER — Other Ambulatory Visit: Payer: Self-pay | Admitting: Cardiovascular Disease

## 2021-01-24 ENCOUNTER — Other Ambulatory Visit: Payer: Self-pay | Admitting: Family Medicine

## 2021-01-24 DIAGNOSIS — N183 Chronic kidney disease, stage 3 unspecified: Secondary | ICD-10-CM

## 2021-01-24 DIAGNOSIS — IMO0002 Reserved for concepts with insufficient information to code with codable children: Secondary | ICD-10-CM

## 2021-01-24 DIAGNOSIS — E1122 Type 2 diabetes mellitus with diabetic chronic kidney disease: Secondary | ICD-10-CM

## 2021-02-08 ENCOUNTER — Encounter: Payer: Self-pay | Admitting: Obstetrics & Gynecology

## 2021-02-08 ENCOUNTER — Ambulatory Visit (INDEPENDENT_AMBULATORY_CARE_PROVIDER_SITE_OTHER): Payer: Medicare HMO | Admitting: Obstetrics & Gynecology

## 2021-02-08 ENCOUNTER — Other Ambulatory Visit: Payer: Self-pay

## 2021-02-08 VITALS — BP 123/77 | HR 90 | Ht 63.0 in | Wt 170.8 lb

## 2021-02-08 DIAGNOSIS — N952 Postmenopausal atrophic vaginitis: Secondary | ICD-10-CM

## 2021-02-08 NOTE — Progress Notes (Signed)
GYNECOLOGY OFFICE VISIT NOTE  History:   Kristy [name] is a 70 y.o. G0 here today for follow up for vulvovaginal atrophy after visit on 3/11/30/20. Had not intercourse in about 6 years, and now has new partner and now has noticed she has "vaginal atrophy". She has extensive cardiac history, also history of hysterectomy for benign reasons many years ago. Not a candidate for estrogen therapy,  recommended water/silicone based hypoallergenic lubricants and hyaluronic acid vaginal moisturizers. Also recommended progressive dilator usage to help with gradual widening of her introitus.    Today, she reports "everything is much better". Recently got engaged with her partner. Reports that she did not have to use lubricants as her partners has seminal leakage and this provides adequate lubrication. Frequent intercourse (every day as patient reported) has also helped with her symptoms. She is very happy. She denies any abnormal vaginal discharge, bleeding, pelvic pain or other concerns.    Past Medical History:  Diagnosis Date  . Allergic rhinitis   . Allergy   . Cataract    mild   . Crohn's disease ([place]) 10/13/2013  . Diverticulosis   . GERD (gastroesophageal reflux disease)   . Gout   . HFrEF (heart failure with reduced ejection fraction) ([place])    a. 12/2008 Cath: EF 45% w/ inf HK; b. 07/2009 Echo: EF 50-55%; c. 08/2018 Echo: EF 20-25%.  Marland Kitchen Hyperlipidemia   . Hypertension   . IBS (irritable bowel syndrome)   . Left bundle branch block   . Neuromuscular disorder (HCC)    neuropathy  . NICM (nonischemic cardiomyopathy) ([place])    a. 12/2008 Cath: no significant dzs, EF 45% w/ inf HK->Med Rx; b. 07/2009 Echo: EF 50-55%; c. 08/2018 Echo: EF 20-25%, ant/antsept HK, mild MR, mildly dil LA, nl RV fx; d. 08/2018 Cath: D1 80, otw nonobs dzs->Med rx.  . Non-obstructive CAD (coronary artery disease)    a. 12/2008 Cath: no significant dzs, EF 45% w/ inf HK->Med Rx; b. 08/2018 Cath: LM nl, LAD min irregs, D1  80, LCX 28md, RCA min irregs->Med Rx.  . Osteoarthritis   . Patient has active power of attorney for health care: TOretha Caprice1/11/2020  . Poorly controlled Diabetes mellitus    a. 07/2018 A1c 13.1.  .Marland KitchenSymptomatic cholelithiasis   . Uncontrolled type 2 diabetes mellitus with stage 3 chronic kidney disease, with long-term current use of insulin ([place]          Past Surgical History:  Procedure Laterality Date  . BREAST BIOPSY Right 06/24/2019   stereo bx/ x clip/ path pending  . BREAST CYST ASPIRATION    . CARDIAC CATHETERIZATION    . COLONOSCOPY    . DILATION AND CURETTAGE OF UTERUS    . LEFT HEART CATH AND CORONARY ANGIOGRAPHY N/A 04/24/2019   Procedure: LEFT HEART CATH AND CORONARY ANGIOGRAPHY;  Surgeon: ENelva Bush MD;  [facility];  Service: Cardiovascular;  Laterality: N/A;  . RIGHT/LEFT HEART CATH AND CORONARY ANGIOGRAPHY N/A 08/26/2018   Procedure: RIGHT/LEFT HEART CATH AND CORONARY ANGIOGRAPHY;  Surgeon: AWellington Hampshire MD;  [facility];  Service: Cardiovascular;  Laterality: N/A;  . SHOULDER SURGERY     15 + yrs ago   . SKIN SURGERY     nose   . VAGINAL HYSTERECTOMY      The following portions of the patient's history were reviewed and updated as appropriate: allergies, current medications, past family history, past medical history, past social history,  past surgical history and problem list.   Health Maintenance:  Normal mammogram on 06/26/2019.   Review of Systems:  Pertinent items noted in HPI and remainder of comprehensive ROS otherwise negative.  Physical Exam:  BP 123/77   Pulse 90   Ht 5' 3"  (1.6 m)   Wt 170 lb 12.8 oz (77.5 kg)   BMI 30.26 kg/m  CONSTITUTIONAL: Well-developed, well-nourished female in no acute distress.  SKIN: No rash noted. Not diaphoretic. No erythema. No pallor. MUSCULOSKELETAL: Normal range of motion. No edema noted. NEUROLOGIC: Alert and oriented to person, place, and time. Normal muscle  tone coordination. No cranial nerve deficit noted. PSYCHIATRIC: Normal mood and affect. Normal behavior. Normal judgment and thought content. CARDIOVASCULAR: Normal heart rate noted RESPIRATORY: Effort and breath sounds normal, no problems with respiration noted ABDOMEN: No masses noted. No other overt distention noted.   PELVIC: Deferred     Assessment and Plan:      Vulvovaginal atrophy Doing well, no concerns currently. No intervention needed. She was told to let us know if there is any other way we can help. No other GYN concerns.   Congratulated on engagement!  She will schedule mammogram with [facility] as usual. Follow up with other specialists as recommended  Routine preventative health maintenance measures emphasized. Please refer to After Visit Summary for other counseling recommendations.   Return for any gynecologic concerns.    I spent 13 minutes dedicated to the care of this patient including pre-visit review of records, face to face time with the patient discussing her conditions and management of her vulvovaginal atrophy.    Verita Schneiders, MD, [place] for [employer], [place]

## 2021-02-14 ENCOUNTER — Other Ambulatory Visit: Payer: Self-pay | Admitting: Family Medicine

## 2021-02-14 DIAGNOSIS — Z1231 Encounter for screening mammogram for malignant neoplasm of breast: Secondary | ICD-10-CM

## 2021-02-22 ENCOUNTER — Ambulatory Visit
Admission: RE | Admit: 2021-02-22 | Discharge: 2021-02-22 | Disposition: A | Discharge status: Home / Self Care | Payer: Medicare HMO | Source: Ambulatory Visit | Attending: Family Medicine | Admitting: Family Medicine

## 2021-02-22 ENCOUNTER — Other Ambulatory Visit: Payer: Self-pay

## 2021-02-22 DIAGNOSIS — Z1231 Encounter for screening mammogram for malignant neoplasm of breast: Secondary | ICD-10-CM | POA: Diagnosis not present

## 2021-02-23 ENCOUNTER — Other Ambulatory Visit: Payer: Self-pay | Admitting: Family Medicine

## 2021-03-15 ENCOUNTER — Other Ambulatory Visit: Payer: Self-pay | Admitting: Internal Medicine

## 2021-03-15 NOTE — Telephone Encounter (Signed)
Rx request sent to pharmacy.  

## 2021-03-25 ENCOUNTER — Telehealth: Payer: Self-pay

## 2021-03-25 NOTE — Chronic Care Management (AMB) (Addendum)
Chronic Care Management Pharmacy Assistant   Name: Kristy [name]  MRN: [id number] DOB: 02/02/1951   Reason for Encounter: Disease State DM   Recent office visits:  None since last CCM contact  Recent consult visits:  02/08/21 - OB GYN - No medication changes   Hospital visits:  None in previous 6 months  Medications: Outpatient Encounter Medications as of 03/25/2021  Medication Sig   aspirin EC 81 MG EC tablet Take 1 tablet (81 mg total) by mouth daily.   carvedilol (COREG) 12.5 MG tablet TAKE 1 TABLET BY MOUTH TWICE DAILY   chlorhexidine (PERIDEX) 0.12 % solution RINSE WITH 1 CAPFUL (15ML) FOR 30 SECONDS IN THE MORNING AND IN THE EVENING AFTER TOOTHBRUSHING DO NOT SWALLOW   Ensure (ENSURE) Take 237 mLs by mouth daily.   ENTRESTO 49-51 MG TAKE 1 TABLET BY MOUTH TWICE DAILY   ezetimibe (ZETIA) 10 MG tablet Take 1 tablet (10 mg total) by mouth daily. (Patient not taking: Reported on 01/10/2021)   glucose blood (ONETOUCH ULTRA) test strip USE TO CHECK BLOOD SUGAR UP TO 8 TIMES ADAY   insulin regular human CONCENTRATED (HUMULIN R) 500 UNIT/ML injection Inject 65 Units into the skin 3 (three) times daily with meals.   Insulin Syringe-Needle U-100 (BD INSULIN SYRINGE U/F) 31G X 5/16" 0.5 ML MISC Use to inject insulin daily   LEVEMIR 100 UNIT/ML injection INJECT 27 UNITS TOTAL INTO THE SKIN TWICE DAILY AS DIRECTED   spironolactone (ALDACTONE) 25 MG tablet TAKE 1 TABLET BY MOUTH ONCE DAILY   No facility-administered encounter medications on file as of 03/25/2021.    Recent Relevant Labs: Lab Results  Component Value Date/Time   HGBA1C 11.7 (A) 10/04/2020 02:43 PM   HGBA1C 10.8 (A) 06/09/2020 11:30 AM   HGBA1C 9.7 (H) 12/08/2019 03:00 PM   HGBA1C 10.9 (H) 07/08/2019 03:07 PM   HGBA1C >14.0 12/02/2018 12:00 AM   MICROALBUR 18.1 (H) 12/10/2018 10:08 AM   MICROALBUR 36.0 (H) 01/10/2017 03:15 PM    Kidney Function Lab Results  Component Value Date/Time   CREATININE 1.03  10/04/2020 04:19 PM   CREATININE 0.86 04/08/2020 02:47 PM   CREATININE 0.72 07/27/2014 09:02 AM   GFR 55.55 (L) 10/04/2020 04:19 PM   GFRNONAA 70 04/08/2020 02:47 PM   GFRNONAA 89 07/27/2014 09:02 AM   GFRAA 80 04/08/2020 02:47 PM   GFRAA >89 07/27/2014 09:02 AM    Current antihyperglycemic regimen:  Levemir 100 units/mL  - Inject 27 units BID (Pt taking differently: 30 units twice daily) Regular insulin - Inject 65 units three times daily    Patient verbally confirms she is taking the above medications as directed. Yes The patient is allowed to adjust her regular insulin according to readings.  What recent interventions/DTPs have been made to improve glycemic control: CPP recommend CGM monitoring, the patient states she got a package with no instructions and does not know how to use it.  Have there been any recent hospitalizations or ED visits since last visit with CPP? No  Patient reports hypoglycemic symptoms, including Pale, Sweaty, Shaky, Hungry, Nervous/irritable, and Vision changes  Patient reports hyperglycemic symptoms, including blurry vision, excessive thirst, fatigue, polyuria, and weakness  How often are you checking your blood sugar? 3-4 times daily  What are your blood sugars ranging?  Fasting:  03/17/21  206 (11:30 when she wakes up) Before meals: varies - 220, 260, 258, 282  After meals: 03/17/21  373 (after lunch) Bedtime: 03/17/21   272 (midnight)  During the week, how often does your blood glucose drop below 70?  Not recently.  Are you checking your feet daily/regularly? Yes  Adherence Review: Is the patient currently on a STATIN medication? No Is the patient currently on ACE/ARB medication? Yes Does the patient have >5 day gap between last estimated fill dates? No  Star Rating Drugs:  Medication:   Last Fill: Day Supply Entresto 49-51 mg  02/18/21 30 Levemir 100unit/ml  03/15/21 19 Humulin R 500unit/ml  10/27/19 10 (PAP)  The patient explained to me that  her BG's have been all over the scale lately due to abscess of tooth and she plans on having dental surgery this Thursday.she is on antibiotics and having very high BG's , and has been instructed to drink up to 6 bottles of water if BG's are > 500 and this will bring her BG's down 100 points and this has worked for her. The patient is interested in having a BG form more specific to write on.   Follow-Up:  Pharmacist Review  [name], CPP notified  Avel Sensor [facility] Clinical Pharmacy Assistant [phone]  I have reviewed the care management and care coordination activities outlined in this encounter and I am certifying that I agree with the content of this note.   [name], PharmD Clinical Pharmacist [facility] at [facility] [phone]

## 2021-03-30 ENCOUNTER — Telehealth: Payer: Self-pay

## 2021-03-30 NOTE — Telephone Encounter (Signed)
Please mail blood glucose log to patient per her request.

## 2021-03-31 DIAGNOSIS — K0253 Dental caries on pit and fissure surface penetrating into pulp: Secondary | ICD-10-CM | POA: Diagnosis not present

## 2021-04-01 ENCOUNTER — Ambulatory Visit: Payer: Medicare HMO | Admitting: Podiatry

## 2021-04-06 ENCOUNTER — Ambulatory Visit: Payer: Medicare HMO | Admitting: Family Medicine

## 2021-04-11 DIAGNOSIS — E113411 Type 2 diabetes mellitus with severe nonproliferative diabetic retinopathy with macular edema, right eye: Secondary | ICD-10-CM | POA: Diagnosis not present

## 2021-04-11 DIAGNOSIS — H2513 Age-related nuclear cataract, bilateral: Secondary | ICD-10-CM | POA: Diagnosis not present

## 2021-04-11 DIAGNOSIS — H524 Presbyopia: Secondary | ICD-10-CM | POA: Diagnosis not present

## 2021-04-11 DIAGNOSIS — Z0100 Encounter for examination of eyes and vision without abnormal findings: Secondary | ICD-10-CM | POA: Diagnosis not present

## 2021-04-11 LAB — HM DIABETES EYE EXAM

## 2021-04-12 ENCOUNTER — Encounter: Payer: Self-pay | Admitting: Pharmacist

## 2021-04-12 DIAGNOSIS — G720 Drug-induced myopathy: Secondary | ICD-10-CM

## 2021-04-12 DIAGNOSIS — T466X5A Adverse effect of antihyperlipidemic and antiarteriosclerotic drugs, initial encounter: Secondary | ICD-10-CM

## 2021-04-12 NOTE — Progress Notes (Signed)
Kristy [name] T. [name], MD, [place] at [facility] [name], [address]  Phone: [phone]  FAX: Kristy - 70 y.o. female  MRN [id number]  Date of Birth: 1951/02/20  Date: 04/13/2021  PCP: Owens Loffler, MD  Referral: Owens Loffler, MD  DM recheck  This visit occurred during the SARS-CoV-2 public health emergency.  Safety protocols were in place, including screening questions prior to the visit, additional usage of staff PPE, and extensive cleaning of exam room while observing appropriate contact time as indicated for disinfecting solutions.   Subjective:   Kristy [name] is a 70 y.o. very pleasant female patient with Body mass index is 28.31 kg/m. who presents with the following:  Here for ongoing chronic disease management.  She does have very significant congestive heart failure, and very poorly controlled diabetes.  Diabetes has been intermittently quite under poor control for many years.  Multiple endocrinologist have not been able to get this under control.  Dental infection now that has taken a long time to heal.  70 year old boyfriend  She has also been hospitalized for DKA a few times in 2020.  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  120 AM BS - does not have a computer to use the [employer] 8 x a day  Diabetes Mellitus: Tolerating Medications: yes Compliance with diet: fair, Body mass index is 28.31 kg/m. Exercise: minimal / intermittent Avg blood sugars at home: 8x a day Foot problems: none Hypoglycemia: none No nausea, vomitting, blurred vision, polyuria.  Lab Results  Component Value Date   HGBA1C 11.8 (A) 04/13/2021   HGBA1C 11.7 (A) 10/04/2020   HGBA1C 10.8 (A) 06/09/2020   Lab Results  Component Value Date   MICROALBUR 18.1 (H) 12/10/2018   LDLCALC 103 (H) 04/22/2019   CREATININE 1.03  10/04/2020    Wt Readings from Last 3 Encounters:  04/13/21 167 lb 8 oz (76 kg)  02/08/21 170 lb 12.8 oz (77.5 kg)  12/30/20 167 lb (75.8 kg)    HTN: Tolerating all medications without side effects Stable and at goal No CP, no sob. No HA.  BP Readings from Last 3 Encounters:  04/13/21 120/60  02/08/21 123/77  12/30/20 202/54    Basic Metabolic Panel:    Component Value Date/Time   NA 136 10/04/2020 1619   NA 136 04/08/2020 1447   K 3.8 10/04/2020 1619   CL 99 10/04/2020 1619   CO2 28 10/04/2020 1619   BUN 22 10/04/2020 1619   BUN 16 04/08/2020 1447   CREATININE 1.03 10/04/2020 1619   CREATININE 0.72 07/27/2014 0902   GLUCOSE 279 (H) 10/04/2020 1619   CALCIUM 11.2 (H) 10/04/2020 1619    Lipids: She has really never been well controlled.  She has not been able to tolerate cholesterol medication.  Most recently, she has tried some Zetia.  Could not take it.   Cardiology did suggest that [place] might be an option. Panel reviewed with patient.  Lipids: Lab Results  Component Value Date   CHOL 267 (H) 10/04/2020   Lab Results  Component Value Date   HDL 36.90 (L) 10/04/2020   Lab Results  Component Value Date   LDLCALC 103 (H) 04/22/2019   Lab Results  Component Value Date   TRIG 339.0 (H) 10/04/2020   Lab Results  Component Value  Date   CHOLHDL 7 10/04/2020    Lab Results  Component Value Date   ALT 19 10/04/2020   AST 22 10/04/2020   ALKPHOS 81 10/04/2020   BILITOT 0.5 10/04/2020     Review of Systems is noted in the HPI, as appropriate  Objective:   BP 120/60   Pulse 89   Temp (!) 97.1 F (36.2 C) (Temporal)   Ht 5' 4.5" (1.638 m)   Wt 167 lb 8 oz (76 kg)   SpO2 99%   BMI 28.31 kg/m   GEN: No acute distress; alert,appropriate. PULM: Breathing comfortably in no respiratory distress PSYCH: Normally interactive.  CV: RRR, no m/g/r  PULM: Normal respiratory rate, no accessory muscle use. No wheezes, crackles or rhonchi   Laboratory and  Imaging Data:  Assessment and Plan:     ICD-10-CM   1. Uncontrolled type 2 diabetes mellitus with stage 3 chronic kidney disease, with long-term current use of insulin (HCC)  E11.22 POCT glycosylated hemoglobin (Hb A1C)   E11.65    N18.30    Z79.4     2. Coronary artery disease, non-occlusive  I25.10     3. Chronic systolic CHF (congestive heart failure) (HCC)  I50.22     4. Mixed hyperlipidemia  E78.2     5. Essential hypertension  I10      Diabetes is unstable.  This is been that way for many years.  She has seen multiple endocrinology departments in the surrounding area as well as 2, none of them were able to get her blood sugars under control.  We are doing the best we can with a combination of Levemir and U500.  Hopefully [name] will be able to help with some assistance program secondary to cost.  She is very compliant with taking all of her cardiac medication.  Very high lipids.  She has been intolerant of all statins, most recently she did start Zetia.  She was unable to tolerate this as well.  Think that Repatha would be a good idea, and she asked me to ask cardiology prior to beginning this medication.  Blood pressure is stable.  No orders of the defined types were placed in this encounter.  Medications Discontinued During This Encounter  Medication Reason   ezetimibe (ZETIA) 10 MG tablet Side effect (s)   Orders Placed This Encounter  Procedures   POCT glycosylated hemoglobin (Hb A1C)    Follow-up: Return in about 3 months (around 07/14/2021) for medicare wellness.  Signed,  Kristy [name]. Kristy Heyward, MD   Outpatient Encounter Medications as of 04/13/2021  Medication Sig   amoxicillin (AMOXIL) 500 MG capsule Take 500 mg by mouth in the morning, at noon, in the evening, and at bedtime.   aspirin EC 81 MG EC tablet Take 1 tablet (81 mg total) by mouth daily.   carvedilol (COREG) 12.5 MG tablet TAKE 1 TABLET BY MOUTH TWICE DAILY   chlorhexidine (PERIDEX) 0.12 %  solution RINSE WITH 1 CAPFUL (15ML) FOR 30 SECONDS IN THE MORNING AND IN THE EVENING AFTER TOOTHBRUSHING DO NOT SWALLOW   Ensure (ENSURE) Take 237 mLs by mouth daily.   ENTRESTO 49-51 MG TAKE 1 TABLET BY MOUTH TWICE DAILY   glucose blood (ONETOUCH ULTRA) test strip USE TO CHECK BLOOD SUGAR UP TO 8 TIMES ADAY   insulin regular human CONCENTRATED (HUMULIN R) 500 UNIT/ML injection Inject 65 Units into the skin 3 (three) times daily with meals.   Insulin Syringe-Needle U-100 (BD INSULIN SYRINGE U/F) 31G  X 5/16" 0.5 ML MISC Use to inject insulin daily   ivabradine (CORLANOR) 5 MG TABS tablet Take 5 mg by mouth daily as needed.   LEVEMIR 100 UNIT/ML injection INJECT 27 UNITS TOTAL INTO THE SKIN TWICE DAILY AS DIRECTED (Patient taking differently: Inject 30 Units into the skin 2 (two) times daily.)   spironolactone (ALDACTONE) 25 MG tablet TAKE 1 TABLET BY MOUTH ONCE DAILY   Probiotic Product (ALIGN PO) Take 1 tablet by mouth daily.   [DISCONTINUED] ezetimibe (ZETIA) 10 MG tablet Take 1 tablet (10 mg total) by mouth daily. (Patient not taking: Reported on 01/10/2021)   No facility-administered encounter medications on file as of 04/13/2021.

## 2021-04-12 NOTE — Progress Notes (Signed)
[facility])                                            [place] Team                                        Statin Quality Measure Assessment    04/12/2021  FATEN FRIESON 1951-01-21 [id number]   Per review of chart and payor information, patient has a diagnosis of diabetes and cardiovascular disease but is not currently filling a statin prescription.  This places patient into the SUPD (Statin Use In Patients with Diabetes) and SPC (Statin Use in Patients with Cardiovascular Disease) measures for CMS.    Patient has documented allergy to statin but no corresponding CPT codes that would exclude patient from SUPD and [facility] measure.  On Ezetimibe  Upcoming appointment with PCP on 04/13/2021--statin therapy could be discussed at the next appointment.     Component Value Date/Time   CHOL 267 (H) 10/04/2020 1619   TRIG 339.0 (H) 10/04/2020 1619   HDL 36.90 (L) 10/04/2020 1619   CHOLHDL 7 10/04/2020 1619   VLDL 67.8 (H) 10/04/2020 1619   LDLCALC 103 (H) 04/22/2019 0700   LDLDIRECT 177.0 10/04/2020 1619    Please consider ONE of the following recommendations:  Initiate high intensity statin Atorvastatin 68m once daily, #90, 3 refills   Rosuvastatin 230monce daily, #90, 3 refills    Initiate moderate intensity  statin with reduced frequency if prior  statin intolerance 1x weekly, #13, 3 refills   2x weekly, #26, 3 refills   3x weekly, #39, 3 refills    Code for past statin intolerance  (required annually)  Provider Requirements: Must associate code during an office visit or telehealth encounter   Drug Induced Myopathy G72.0   Myositis, unspecified M60.9   Myopathy, unspecified G72.9   Rhabdomyolysis  M6Y58.59 Alcoholic cirrhosis of liver without ascites K7Y92.44 Alcoholic cirrhosis of liver with ascites K70.31   Unspecified cirrhosis of liver K74.60   Toxic liver disease with fibrosis and cirrhosis of  liver K71.7      Plan: Will route note to Dr. CoLorelei Pontrior to the upcoming appointment.  KaElayne GuerinPharmD, BCCliolinical Pharmacist [phone]

## 2021-04-13 ENCOUNTER — Telehealth: Payer: Self-pay

## 2021-04-13 ENCOUNTER — Other Ambulatory Visit: Payer: Self-pay

## 2021-04-13 ENCOUNTER — Encounter: Payer: Self-pay | Admitting: Family Medicine

## 2021-04-13 ENCOUNTER — Ambulatory Visit (INDEPENDENT_AMBULATORY_CARE_PROVIDER_SITE_OTHER): Payer: Medicare HMO | Admitting: Family Medicine

## 2021-04-13 VITALS — BP 120/60 | HR 89 | Temp 97.1°F | Ht 64.5 in | Wt 167.5 lb

## 2021-04-13 DIAGNOSIS — I2510 Atherosclerotic heart disease of native coronary artery without angina pectoris: Secondary | ICD-10-CM | POA: Diagnosis not present

## 2021-04-13 DIAGNOSIS — E782 Mixed hyperlipidemia: Secondary | ICD-10-CM | POA: Diagnosis not present

## 2021-04-13 DIAGNOSIS — I10 Essential (primary) hypertension: Secondary | ICD-10-CM | POA: Diagnosis not present

## 2021-04-13 DIAGNOSIS — Z794 Long term (current) use of insulin: Secondary | ICD-10-CM | POA: Diagnosis not present

## 2021-04-13 DIAGNOSIS — IMO0002 Reserved for concepts with insufficient information to code with codable children: Secondary | ICD-10-CM

## 2021-04-13 DIAGNOSIS — E1122 Type 2 diabetes mellitus with diabetic chronic kidney disease: Secondary | ICD-10-CM | POA: Diagnosis not present

## 2021-04-13 DIAGNOSIS — I5022 Chronic systolic (congestive) heart failure: Secondary | ICD-10-CM | POA: Diagnosis not present

## 2021-04-13 DIAGNOSIS — E1165 Type 2 diabetes mellitus with hyperglycemia: Secondary | ICD-10-CM | POA: Diagnosis not present

## 2021-04-13 DIAGNOSIS — N183 Chronic kidney disease, stage 3 unspecified: Secondary | ICD-10-CM

## 2021-04-13 LAB — POCT GLYCOSYLATED HEMOGLOBIN (HGB A1C): Hemoglobin A1C: 11.8 % — AB (ref 4.0–5.6)

## 2021-04-13 NOTE — Telephone Encounter (Signed)
Please see staff message regarding insulin PAP.  [name], CMA  [name], [employer] Kristy [name] was in today seeing Dr. Lorelei Pont and mentioned she was almost out of her Humulin R.  In the past I think she has gotten it from patient assistance. Is this something you can help her with?   [name]

## 2021-04-14 ENCOUNTER — Encounter: Payer: Self-pay | Admitting: Family Medicine

## 2021-04-15 ENCOUNTER — Encounter (INDEPENDENT_AMBULATORY_CARE_PROVIDER_SITE_OTHER): Payer: Medicare HMO | Admitting: Ophthalmology

## 2021-04-15 ENCOUNTER — Other Ambulatory Visit: Payer: Self-pay

## 2021-04-15 ENCOUNTER — Ambulatory Visit: Payer: Medicare HMO | Admitting: Podiatry

## 2021-04-15 DIAGNOSIS — E119 Type 2 diabetes mellitus without complications: Secondary | ICD-10-CM | POA: Diagnosis not present

## 2021-04-15 NOTE — Progress Notes (Signed)
   HPI: 70 y.o. female presenting today PMHx diabetes mellitus, poorly controlled presenting for routine annual foot exam.  Patient states that she has no complaints associated to her feet.  She has developed some blindness in the right eye secondary to retinopathy.  Patient states that she has a longstanding history of uncontrolled diabetes mellitus with her last hemoglobin A1c around 11.8 on 04/13/2021.  She presents today for further treatment and evaluation and routine diabetic foot exam  Past Medical History:  Diagnosis Date   Allergic rhinitis    Allergy    Cataract    mild    Crohn's disease ([place]) 10/13/2013   Diverticulosis    GERD (gastroesophageal reflux disease)    Gout    HFrEF (heart failure with reduced ejection fraction) ([place])    a. 12/2008 Cath: EF 45% w/ inf HK; b. 07/2009 Echo: EF 50-55%; c. 08/2018 Echo: EF 20-25%.   Hyperlipidemia    Hypertension    IBS (irritable bowel syndrome)    Left bundle branch block    Neuromuscular disorder (HCC)    neuropathy   NICM (nonischemic cardiomyopathy) ([place])    a. 12/2008 Cath: no significant dzs, EF 45% w/ inf HK->Med Rx; b. 07/2009 Echo: EF 50-55%; c. 08/2018 Echo: EF 20-25%, ant/antsept HK, mild MR, mildly dil LA, nl RV fx; d. 08/2018 Cath: D1 80, otw nonobs dzs->Med rx.   Non-obstructive CAD (coronary artery disease)    a. 12/2008 Cath: no significant dzs, EF 45% w/ inf HK->Med Rx; b. 08/2018 Cath: LM nl, LAD min irregs, D1 80, LCX 95md, RCA min irregs->Med Rx.   Osteoarthritis    Patient has active power of attorney for health care: TOretha Caprice1/11/2020   Poorly controlled Diabetes mellitus    a. 07/2018 A1c 13.1.   Symptomatic cholelithiasis    Uncontrolled type 2 diabetes mellitus with stage 3 chronic kidney disease, with long-term current use of insulin ([place]           Physical Exam: General: The patient is alert and oriented x3 in no acute distress.  Dermatology: Skin is warm, dry and supple bilateral lower extremities.  Negative for open lesions or macerations.  Vascular: Palpable pedal pulses bilaterally. No edema or erythema noted. Capillary refill within normal limits.  Neurological: Epicritic and protective threshold grossly intact bilaterally.   Musculoskeletal Exam: No pedal deformities noted  Assessment: 1.  Diabetes mellitus type 2, poorly controlled 2.  Encounter for routine diabetic foot exam   Plan of Care:  1. Patient evaluated.  2.  Comprehensive routine diabetic foot exam was performed today.  No concerns at the moment. 3.  Recommend good supportive shoes and sneakers 4.  Return to clinic annually      BEdrick Kins DPM [facility]  Dr. BEdrick Kins DPM    [address] [place] [address]               Office [phone] Fax [phone]

## 2021-04-25 ENCOUNTER — Encounter: Payer: Self-pay | Admitting: Family Medicine

## 2021-04-28 ENCOUNTER — Other Ambulatory Visit: Payer: Self-pay

## 2021-04-28 ENCOUNTER — Ambulatory Visit: Payer: Medicare HMO | Admitting: Podiatry

## 2021-04-28 DIAGNOSIS — L539 Erythematous condition, unspecified: Secondary | ICD-10-CM

## 2021-04-28 DIAGNOSIS — T148XXA Other injury of unspecified body region, initial encounter: Secondary | ICD-10-CM | POA: Diagnosis not present

## 2021-04-28 DIAGNOSIS — E0865 Diabetes mellitus due to underlying condition with hyperglycemia: Secondary | ICD-10-CM

## 2021-04-28 MED ORDER — DOXYCYCLINE HYCLATE 100 MG PO TABS: 100.0000 mg | ORAL_TABLET | Freq: Two times a day (BID) | ORAL | 0 refills | Status: DC

## 2021-04-29 ENCOUNTER — Encounter: Payer: Self-pay | Admitting: Podiatry

## 2021-04-29 NOTE — Telephone Encounter (Signed)
PCP reached out and would like to [place] pursue PAP for [facility]. Please contact patient to see if she's interested and coordinate signatures. Thanks  [name], PharmD Clinical Pharmacist [facility] at [facility] [phone]

## 2021-04-29 NOTE — Progress Notes (Signed)
Subjective:  Patient ID: Kristy [name], female    DOB: Jan 22, 1951,  MRN: [id number]  Chief Complaint  Patient presents with   Diabetes    Toe pain    70 y.o. female presents with the above complaint.  Patient presents with complaint of right hallux and second digit blister formation.  Patient states that she is a diabetic with uncontrolled A1c last being 11.8.  She states that her soreness to the toe has progressed to gotten worse there are some redness associated with it.  She wanted to get it evaluated make sure that there is not turning into her diabetic foot infection.  She has not seen anyone else prior to seeing me.  She denies any trauma to the area.  She states that she does not recall any type of friction blisters that could have formed from anything.  She does not recall any burn injury.   Review of Systems: Negative except as noted in the HPI. Denies N/V/F/Ch.  Past Medical History:  Diagnosis Date   Allergic rhinitis    Allergy    Cataract    mild    Crohn's disease ([place]) 10/13/2013   Diverticulosis    GERD (gastroesophageal reflux disease)    Gout    HFrEF (heart failure with reduced ejection fraction) ([place])    a. 12/2008 Cath: EF 45% w/ inf HK; b. 07/2009 Echo: EF 50-55%; c. 08/2018 Echo: EF 20-25%.   Hyperlipidemia    Hypertension    IBS (irritable bowel syndrome)    Left bundle branch block    Neuromuscular disorder (HCC)    neuropathy   NICM (nonischemic cardiomyopathy) ([place])    a. 12/2008 Cath: no significant dzs, EF 45% w/ inf HK->Med Rx; b. 07/2009 Echo: EF 50-55%; c. 08/2018 Echo: EF 20-25%, ant/antsept HK, mild MR, mildly dil LA, nl RV fx; d. 08/2018 Cath: D1 80, otw nonobs dzs->Med rx.   Non-obstructive CAD (coronary artery disease)    a. 12/2008 Cath: no significant dzs, EF 45% w/ inf HK->Med Rx; b. 08/2018 Cath: LM nl, LAD min irregs, D1 80, LCX 10md, RCA min irregs->Med Rx.   Osteoarthritis    Patient has active power of attorney for health care: TOretha Caprice1/11/2020   Poorly controlled Diabetes mellitus    a. 07/2018 A1c 13.1.   Symptomatic cholelithiasis    Uncontrolled type 2 diabetes mellitus with stage 3 chronic kidney disease, with long-term current use of insulin (HCC)          Current Outpatient Medications:    doxycycline (VIBRA-TABS) 100 MG tablet, Take 1 tablet (100 mg total) by mouth 2 (two) times daily., Disp: 28 tablet, Rfl: 0   amoxicillin (AMOXIL) 500 MG capsule, Take 500 mg by mouth in the morning, at noon, in the evening, and at bedtime., Disp: , Rfl:    aspirin EC 81 MG EC tablet, Take 1 tablet (81 mg total) by mouth daily., Disp:  , Rfl:    carvedilol (COREG) 12.5 MG tablet, TAKE 1 TABLET BY MOUTH TWICE DAILY, Disp: 60 tablet, Rfl: 4   chlorhexidine (PERIDEX) 0.12 % solution, RINSE WITH 1 CAPFUL (15ML) FOR 30 SECONDS IN THE MORNING AND IN THE EVENING AFTER TOOTHBRUSHING DO NOT SWALLOW, Disp: , Rfl:    Ensure (ENSURE), Take 237 mLs by mouth daily., Disp: , Rfl:    ENTRESTO 49-51 MG, TAKE 1 TABLET BY MOUTH TWICE DAILY, Disp: 60 tablet, Rfl: 4   glucose blood (ONETOUCH ULTRA) test strip, USE TO CHECK BLOOD  SUGAR UP TO 8 TIMES ADAY, Disp: 250 each, Rfl: 11   insulin regular human CONCENTRATED (HUMULIN R) 500 UNIT/ML injection, Inject 65 Units into the skin 3 (three) times daily with meals., Disp: , Rfl:    Insulin Syringe-Needle U-100 (BD INSULIN SYRINGE U/F) 31G X 5/16" 0.5 ML MISC, Use to inject insulin daily, Disp: 100 each, Rfl: 11   ivabradine (CORLANOR) 5 MG TABS tablet, Take 5 mg by mouth daily as needed., Disp: , Rfl:    LEVEMIR 100 UNIT/ML injection, INJECT 27 UNITS TOTAL INTO THE SKIN TWICE DAILY AS DIRECTED (Patient taking differently: Inject 30 Units into the skin 2 (two) times daily.), Disp: 10 mL, Rfl: 2   Probiotic Product (ALIGN PO), Take 1 tablet by mouth daily., Disp: , Rfl:    spironolactone (ALDACTONE) 25 MG tablet, TAKE 1 TABLET BY MOUTH ONCE DAILY, Disp: 30 tablet, Rfl: 6  Social History   Tobacco  Use  Smoking Status Former   Packs/day: 0.75   Years: 30.00   Pack years: 22.50   Types: Cigarettes   Quit date: 06/06/1997   Years since quitting: 23.9  Smokeless Tobacco Never    Allergies  Allergen Reactions   Fish Oil Other (See Comments)    Gout   Glimepiride Other (See Comments)    REACTION: hypoglycemia   Guanfacine Hcl Other (See Comments)    REACTION: unspecified   Rosiglitazone Other (See Comments)    CHF   Objective:  There were no vitals filed for this visit. There is no height or weight on file to calculate BMI. Constitutional Well developed. Well nourished.  Vascular Dorsalis pedis pulses palpable bilaterally. Posterior tibial pulses palpable bilaterally. Capillary refill normal to all digits.  No cyanosis or clubbing noted. Pedal hair growth normal.  Neurologic Normal speech. Oriented to person, place, and time. Epicritic sensation to light touch grossly present bilaterally.  Dermatologic Friction blister noted to right hallux and second digit.  Limited to the breakdown of the skin.  No concern for any oral ulcers or injury.  Redness noted circumferentially around the big toe.  No redness around the second digit.  No purulent drainage noted.  Orthopedic: Normal joint ROM without pain or crepitus bilaterally. No visible deformities. No bony tenderness.   Radiographs: None Assessment:   1. Diabetes mellitus due to underlying condition, uncontrolled, with hyperglycemia ([place])   2. Friction blister   3. Erythema    Plan:  Patient was evaluated and treated and all questions answered.  Right second and hallux friction blister/burn injury superficial -I explained the patient the etiology of fracture blister versus treatment options were extensively discussed.  I discussed with the patient to immediately start doing Betadine wet-to-dry dressing changes to allow the blister to dry out.  I encouraged her to wear surgical shoe.  Surgical shoe was dispensed. -Also  discussed with her she will benefit from doxycycline for skin and soft tissue prophylaxis -If her redness gets worse or become streaking or rash or go to the emergency room right away.  She states understanding.  I will see her back again next week to reevaluate. -I encourage glucose control and management.  She is a high risk for diabetic foot infection/amputation.  No follow-ups on file.

## 2021-05-02 NOTE — Progress Notes (Signed)
Cardiology Office Note  Date:  05/03/2021   ID:  Virginie, Josten 07-18-1951, MRN [id number]  PCP:  Owens Loffler, MD   Chief Complaint  Patient presents with   6 month follow up     "Doing well." Medications reviewed by the patient verbally.    Cardiology Office Note  Date:  05/03/2021   ID:  Johniece, Hornbaker 04-Dec-1950, MRN [id number]  PCP:  Owens Loffler, MD   Chief Complaint  Patient presents with   6 month follow up     "Doing well." Medications reviewed by the patient verbally.     HPI:  Mr. Shatarra Wehling is a 70 yo with HTN,  diabetes, Hemoglobin A1c of 12.4, poorly controlled GI disease/IBS,  Hyperlipidemia, LDL 168 h/o smoking,   Previous cardiac catheterization in 2009 showed mild luminal irregularities.   Crohn's disease. Normal EF in 07/2009 chronic tachycardia Nonischemic cardiomyopathy ejection fraction 20 to 25%  EF 55% in 05/2020 by echo presents for followup of her hyperlipidemia, coronary artery disease and tachycardia/cardiomyopathy  LOV 06/2019 08/2019 discharged on 08/13/2019 with DKA and reportedly was non-compliant  In hospital again: mental status changes discharged on 08/13/2019 with DKA and reportedly was non-compliant with her medications.   Blister right foot, Has been cut off, on ABX Has bandage in place  Labs reviewed LDL 177  Echo 04/2019 1. Severe hypokinesis of the left ventricular, mid-apical anterior wall  and anteroseptal wall.   2. The left ventricle has moderately reduced systolic function, with an  ejection fraction of 35-40%. The cavity size was normal. Left ventricular  diastolic function could not be evaluated.  Echo 05/2020  1. Left ventricular ejection fraction, by estimation, is 55 to 60%. The  left ventricle has normal function. The left ventricle has no regional  wall motion abnormalities. Left ventricular diastolic parameters are  consistent with Grade I diastolic  dysfunction (impaired  relaxation). Elevated left atrial pressure.   2. Right ventricular systolic function is mildly reduced. The right  ventricular size is normal. Tricuspid regurgitation signal is inadequate  for assessing PA pressure.   EKG personally reviewed by myself on todays visit Nsr rate 80 bpm, LBBB  Other past medical hx reviewed In hospital 04/2019, blood pressure Demand ischemia in the setting of nonischemic cardiomyopathy, severe DKA.  -Catheterization with stable appearance of coronary arteries .     Heart catheterization April 24, 2019 Stable appearance of coronary arteries since 08/2018 without new lesion to explain recent decline in LVEF or troponin elevation.  I suspect elevated troponin may be due to supply-demand mismatch and/or stress-induced cardiomyopathy in the setting of severe DKA. Focal D1 stenosis remains ~80%.  There is mild to moderate, non-obstructive disease involving the LAD and LCx. Mildly elevated left ventricular filling pressure.   Echo 08/2018 - Left ventricle: The cavity size was mildly dilated. Systolic   function was severely reduced. The estimated ejection fraction   was in the range of 20% to 25%.   Husband passed away from complications of severe underlying lung disease, fibrosis     PMH:   has a past medical history of Allergic rhinitis, Allergy, Cataract, Crohn's disease ([place]) (10/13/2013), Diverticulosis, GERD (gastroesophageal reflux disease), Gout, HFrEF (heart failure with reduced ejection fraction) ([place]), Hyperlipidemia, Hypertension, IBS (irritable bowel syndrome), Left bundle branch block, Neuromuscular disorder ([place]), NICM (nonischemic cardiomyopathy) ([place]), Non-obstructive CAD (coronary artery disease), Osteoarthritis, Patient has active power of attorney for health care: Todd Peeler (10/04/2020), Poorly controlled Diabetes mellitus, Symptomatic cholelithiasis,  and Uncontrolled type 2 diabetes mellitus with stage 3 chronic kidney disease, with long-term  current use of insulin ([place]).  PSH:    Past Surgical History:  Procedure Laterality Date   BREAST BIOPSY Right 06/24/2019   stereo bx/ x clip/ neg   BREAST CYST ASPIRATION     CARDIAC CATHETERIZATION     COLONOSCOPY     DILATION AND CURETTAGE OF UTERUS     LEFT HEART CATH AND CORONARY ANGIOGRAPHY N/A 04/24/2019   Procedure: LEFT HEART CATH AND CORONARY ANGIOGRAPHY;  Surgeon: Nelva Bush, MD;  Location: [facility];  Service: Cardiovascular;  Laterality: N/A;   RIGHT/LEFT HEART CATH AND CORONARY ANGIOGRAPHY N/A 08/26/2018   Procedure: RIGHT/LEFT HEART CATH AND CORONARY ANGIOGRAPHY;  Surgeon: Wellington Hampshire, MD;  Location: [facility];  Service: Cardiovascular;  Laterality: N/A;   SHOULDER SURGERY     15 + yrs ago    SKIN SURGERY     nose    VAGINAL HYSTERECTOMY      Current Outpatient Medications  Medication Sig Dispense Refill   amoxicillin (AMOXIL) 500 MG capsule Take 500 mg by mouth in the morning, at noon, in the evening, and at bedtime.     aspirin EC 81 MG EC tablet Take 1 tablet (81 mg total) by mouth daily.     carvedilol (COREG) 12.5 MG tablet TAKE 1 TABLET BY MOUTH TWICE DAILY 60 tablet 4   chlorhexidine (PERIDEX) 0.12 % solution RINSE WITH 1 CAPFUL (15ML) FOR 30 SECONDS IN THE MORNING AND IN THE EVENING AFTER TOOTHBRUSHING DO NOT SWALLOW     doxycycline (VIBRA-TABS) 100 MG tablet Take 1 tablet (100 mg total) by mouth 2 (two) times daily. 28 tablet 0   Ensure (ENSURE) Take 237 mLs by mouth daily.     ENTRESTO 49-51 MG TAKE 1 TABLET BY MOUTH TWICE DAILY 60 tablet 4   glucose blood (ONETOUCH ULTRA) test strip USE TO CHECK BLOOD SUGAR UP TO 8 TIMES ADAY 250 each 11   insulin regular human CONCENTRATED (HUMULIN R) 500 UNIT/ML injection Inject 65 Units into the skin 3 (three) times daily with meals.     Insulin Syringe-Needle U-100 (BD INSULIN SYRINGE U/F) 31G X 5/16" 0.5 ML MISC Use to inject insulin daily 100 each 11   ivabradine (CORLANOR) 5 MG TABS  tablet Take 5 mg by mouth daily as needed.     LEVEMIR 100 UNIT/ML injection INJECT 27 UNITS TOTAL INTO THE SKIN TWICE DAILY AS DIRECTED (Patient taking differently: Inject 30 Units into the skin 2 (two) times daily.) 10 mL 2   Probiotic Product (ALIGN PO) Take 1 tablet by mouth daily.     spironolactone (ALDACTONE) 25 MG tablet TAKE 1 TABLET BY MOUTH ONCE DAILY 30 tablet 6   No current facility-administered medications for this visit.     Allergies:   Fish oil, Glimepiride, Guanfacine hcl, and Rosiglitazone   Social History:  The patient  reports that she quit smoking about 23 years ago. Her smoking use included cigarettes. She has a 22.50 pack-year smoking history. She has never used smokeless tobacco. She reports that she does not drink alcohol and does not use drugs.   Family History:   family history includes Hypertension in her father; Kidney failure in her brother and mother.    Review of Systems: Review of Systems  Constitutional: Negative.   HENT: Negative.    Respiratory: Negative.    Cardiovascular: Negative.   Gastrointestinal: Negative.   Musculoskeletal:  Negative.        Gait instability  Neurological: Negative.   Psychiatric/Behavioral: Negative.    All other systems reviewed and are negative.   PHYSICAL EXAM: VS:  BP (!) 110/54 (BP Location: Left Arm, Patient Position: Sitting, Cuff Size: Normal)   Pulse 80   Ht 5' 3"  (1.6 m)   Wt 167 lb 8 oz (76 kg)   SpO2 99%   BMI 29.67 kg/m  , BMI Body mass index is 29.67 kg/m. Constitutional:  oriented to person, place, and time. No distress.  HENT:  Head: Grossly normal Eyes:  no discharge. No scleral icterus.  Neck: No JVD, no carotid bruits  Cardiovascular: Regular rate and rhythm, no murmurs appreciated Pulmonary/Chest: Clear to auscultation bilaterally, no wheezes or rails Abdominal: Soft.  no distension.  no tenderness.  Musculoskeletal: Normal range of motion Bandage on right foot Neurological:  normal  muscle tone. Coordination normal. No atrophy Skin: Skin warm and dry Psychiatric: normal affect, pleasant   Recent Labs: 10/04/2020: ALT 19; BUN 22; Creatinine, Ser 1.03; Hemoglobin 14.3; Platelets 181.0; Potassium 3.8; Sodium 136    Lipid Panel Lab Results  Component Value Date   CHOL 267 (H) 10/04/2020   HDL 36.90 (L) 10/04/2020   LDLCALC 103 (H) 04/22/2019   TRIG 339.0 (H) 10/04/2020     Wt Readings from Last 3 Encounters:  05/03/21 167 lb 8 oz (76 kg)  04/13/21 167 lb 8 oz (76 kg)  02/08/21 170 lb 12.8 oz (77.5 kg)     ASSESSMENT AND PLAN:   Essential hypertension - Plan: EKG 12-Lead Blood pressure is well controlled on today's visit. No changes made to the medications.  Sinus tachycardia - Plan: EKG 12-Lead Rate well controlled  Cad with stable disease No change Single-vessel disease, medical management  Type 2 diabetes mellitus with vascular disease (HCC) Hemoglobin A1c  Markedly elevated, chronic issue, hx of DKA  endocrinology at [facility]  Other hyperlipidemia On lipitor, stressed compliance Not on zetia Will start repatha  CAD cardiac catheterization severe single-vessel disease, medical management Challenging DM  Nonischemic cardiomyopathy Stay on current meds EF back to normal, Coreg, entresto, spiroloctone    Total encounter time more than 25 minutes  Greater than 50% was spent in counseling and coordination of care with the patient   Orders Placed This Encounter  Procedures   EKG 12-Lead     Signed, Esmond Plants, M.D., Ph.D. 05/03/2021  [facility] [place], [place] [phone]

## 2021-05-03 ENCOUNTER — Encounter: Payer: Self-pay | Admitting: Cardiovascular Disease

## 2021-05-03 ENCOUNTER — Telehealth: Payer: Self-pay

## 2021-05-03 ENCOUNTER — Other Ambulatory Visit: Payer: Self-pay

## 2021-05-03 ENCOUNTER — Ambulatory Visit: Payer: Medicare HMO | Admitting: Cardiovascular Disease

## 2021-05-03 VITALS — BP 110/54 | HR 80 | Ht 63.0 in | Wt 167.5 lb

## 2021-05-03 DIAGNOSIS — E1165 Type 2 diabetes mellitus with hyperglycemia: Secondary | ICD-10-CM

## 2021-05-03 DIAGNOSIS — I10 Essential (primary) hypertension: Secondary | ICD-10-CM | POA: Diagnosis not present

## 2021-05-03 DIAGNOSIS — I428 Other cardiomyopathies: Secondary | ICD-10-CM

## 2021-05-03 DIAGNOSIS — M791 Myalgia, unspecified site: Secondary | ICD-10-CM

## 2021-05-03 DIAGNOSIS — I5022 Chronic systolic (congestive) heart failure: Secondary | ICD-10-CM | POA: Diagnosis not present

## 2021-05-03 DIAGNOSIS — I2510 Atherosclerotic heart disease of native coronary artery without angina pectoris: Secondary | ICD-10-CM

## 2021-05-03 DIAGNOSIS — Z794 Long term (current) use of insulin: Secondary | ICD-10-CM | POA: Diagnosis not present

## 2021-05-03 DIAGNOSIS — R000 Tachycardia, unspecified: Secondary | ICD-10-CM

## 2021-05-03 DIAGNOSIS — E782 Mixed hyperlipidemia: Secondary | ICD-10-CM | POA: Diagnosis not present

## 2021-05-03 DIAGNOSIS — E785 Hyperlipidemia, unspecified: Secondary | ICD-10-CM

## 2021-05-03 MED ORDER — REPATHA SURECLICK 140 MG/ML ~~LOC~~ SOAJ: 1.0000 "pen " | SUBCUTANEOUS | 11 refills | Status: DC

## 2021-05-03 MED ORDER — CARVEDILOL 12.5 MG PO TABS: 12.5000 mg | ORAL_TABLET | Freq: Two times a day (BID) | ORAL | 11 refills | Status: DC

## 2021-05-03 MED ORDER — SPIRONOLACTONE 25 MG PO TABS: 25.0000 mg | ORAL_TABLET | Freq: Every day | ORAL | 11 refills | Status: DC

## 2021-05-03 MED ORDER — ENTRESTO 49-51 MG PO TABS: 1.0000 | ORAL_TABLET | Freq: Two times a day (BID) | ORAL | 11 refills | Status: DC

## 2021-05-03 NOTE — Patient Instructions (Addendum)
Medication Instructions:  START repatha 140 mg every other week SQ  [email or web address] (co-pay card and patient assistance if needed)   If you need a refill on your cardiac medications before your next appointment, please call your pharmacy.   Lab work: Lipid (3 months) Walk into medical mall at the check in desk, they will direct you to lab registration, hours for labs are Monday-Friday 07:00am-5:30pm (no appointment necessary)  Testing/Procedures: No new testing needed  Follow-Up: At [facility], you and your health needs are our priority.  As part of our continuing mission to provide you with exceptional heart care, we have created designated Provider Care Teams.  These Care Teams include your primary Cardiologist (physician) and Advanced Practice Providers (APPs -  Physician Assistants and Nurse Practitioners) who all work together to provide you with the care you need, when you need it.  You will need a follow up appointment in 12 months  Providers on your designated Care Team:   Murray Hodgkins, NP Christell Faith, PA-C Marrianne Mood, PA-C Cadence Kathlen Mody, [place]  Any Other Special Instructions Will Be Listed Below (If Applicable).  COVID-19 Vaccine Information can be found at: ShippingScam.co.uk For questions related to vaccine distribution or appointments, please email vaccine@ .com or call [phone].

## 2021-05-03 NOTE — Telephone Encounter (Signed)
Per [facility], Prior Authorization is required for Repatha 164m/mL auto-injectors however Praluent is the covered formulary drug on [employer] plan. Spoke with MIlene Qua RN who states patient does not want to switch drugs and will pay out of pocket cost for this medication. She states patient is aware that is a high priced brand name drug.  Spoke with Amy at [facility] informed her of this. Advised her to call our office if patient changes her mind and would like our office to initiate a PA for Praluent.

## 2021-05-05 ENCOUNTER — Telehealth: Payer: Self-pay

## 2021-05-05 ENCOUNTER — Other Ambulatory Visit: Payer: Self-pay

## 2021-05-05 ENCOUNTER — Ambulatory Visit: Payer: Medicare HMO | Admitting: Podiatry

## 2021-05-05 DIAGNOSIS — E0865 Diabetes mellitus due to underlying condition with hyperglycemia: Secondary | ICD-10-CM

## 2021-05-05 DIAGNOSIS — T148XXA Other injury of unspecified body region, initial encounter: Secondary | ICD-10-CM | POA: Diagnosis not present

## 2021-05-05 NOTE — Progress Notes (Addendum)
Form completed and uploaded with information for Humulin R U-500 - Inject 65 units TID to apply for PAP. The patient will come to office at [facility] when ready to sign and date forms. She will bring proof of income at that time.  [name], CPP notified  Avel Sensor, [place] Assistant [phone]  I have reviewed the care management and care coordination activities outlined in this encounter and I am certifying that I agree with the content of this note. Reviewed and sent to front desk.  [name], PharmD Clinical Pharmacist [facility] at [facility] [phone]

## 2021-05-06 NOTE — Progress Notes (Signed)
Subjective:  Patient ID: Kristy [name], female    DOB: 1950/10/14,  MRN: [id number]  Chief Complaint  Patient presents with   Toe Pain    Right hallux wound follow up     70 y.o. female presents with the above complaint.  Patient presents with follow-up to right hallux and second digit burn injury/blister formation.  Patient states she is doing a lot better.  Betadine helped a lot.  She has been taking antibiotics as well.  She denies any other acute complaints ambulating with surgical shoe.   Review of Systems: Negative except as noted in the HPI. Denies N/V/F/Ch.  Past Medical History:  Diagnosis Date   Allergic rhinitis    Allergy    Cataract    mild    Crohn's disease ([place]) 10/13/2013   Diverticulosis    GERD (gastroesophageal reflux disease)    Gout    HFrEF (heart failure with reduced ejection fraction) ([place])    a. 12/2008 Cath: EF 45% w/ inf HK; b. 07/2009 Echo: EF 50-55%; c. 08/2018 Echo: EF 20-25%.   Hyperlipidemia    Hypertension    IBS (irritable bowel syndrome)    Left bundle branch block    Neuromuscular disorder (HCC)    neuropathy   NICM (nonischemic cardiomyopathy) ([place])    a. 12/2008 Cath: no significant dzs, EF 45% w/ inf HK->Med Rx; b. 07/2009 Echo: EF 50-55%; c. 08/2018 Echo: EF 20-25%, ant/antsept HK, mild MR, mildly dil LA, nl RV fx; d. 08/2018 Cath: D1 80, otw nonobs dzs->Med rx.   Non-obstructive CAD (coronary artery disease)    a. 12/2008 Cath: no significant dzs, EF 45% w/ inf HK->Med Rx; b. 08/2018 Cath: LM nl, LAD min irregs, D1 80, LCX 37md, RCA min irregs->Med Rx.   Osteoarthritis    Patient has active power of attorney for health care: TOretha Caprice1/11/2020   Poorly controlled Diabetes mellitus    a. 07/2018 A1c 13.1.   Symptomatic cholelithiasis    Uncontrolled type 2 diabetes mellitus with stage 3 chronic kidney disease, with long-term current use of insulin (HCC)          Current Outpatient Medications:    amoxicillin (AMOXIL) 500 MG  capsule, Take 500 mg by mouth in the morning, at noon, in the evening, and at bedtime., Disp: , Rfl:    aspirin EC 81 MG EC tablet, Take 1 tablet (81 mg total) by mouth daily., Disp:  , Rfl:    carvedilol (COREG) 12.5 MG tablet, Take 1 tablet (12.5 mg total) by mouth 2 (two) times daily., Disp: 60 tablet, Rfl: 11   chlorhexidine (PERIDEX) 0.12 % solution, RINSE WITH 1 CAPFUL (15ML) FOR 30 SECONDS IN THE MORNING AND IN THE EVENING AFTER TOOTHBRUSHING DO NOT SWALLOW, Disp: , Rfl:    doxycycline (VIBRA-TABS) 100 MG tablet, Take 1 tablet (100 mg total) by mouth 2 (two) times daily., Disp: 28 tablet, Rfl: 0   Ensure (ENSURE), Take 237 mLs by mouth daily., Disp: , Rfl:    Evolocumab (REPATHA SURECLICK) 1970MG/ML SOAJ, Inject 1 pen into the skin every 14 (fourteen) days., Disp: 2 mL, Rfl: 11   glucose blood (ONETOUCH ULTRA) test strip, USE TO CHECK BLOOD SUGAR UP TO 8 TIMES ADAY, Disp: 250 each, Rfl: 11   insulin regular human CONCENTRATED (HUMULIN R) 500 UNIT/ML injection, Inject 65 Units into the skin 3 (three) times daily with meals., Disp: , Rfl:    Insulin Syringe-Needle U-100 (BD INSULIN SYRINGE U/F) 31G X 5/16"  0.5 ML MISC, Use to inject insulin daily, Disp: 100 each, Rfl: 11   ivabradine (CORLANOR) 5 MG TABS tablet, Take 5 mg by mouth daily as needed., Disp: , Rfl:    LEVEMIR 100 UNIT/ML injection, INJECT 27 UNITS TOTAL INTO THE SKIN TWICE DAILY AS DIRECTED (Patient taking differently: Inject 30 Units into the skin 2 (two) times daily.), Disp: 10 mL, Rfl: 2   Probiotic Product (ALIGN PO), Take 1 tablet by mouth daily., Disp: , Rfl:    sacubitril-valsartan (ENTRESTO) 49-51 MG, Take 1 tablet by mouth 2 (two) times daily., Disp: 60 tablet, Rfl: 11   spironolactone (ALDACTONE) 25 MG tablet, Take 1 tablet (25 mg total) by mouth daily., Disp: 30 tablet, Rfl: 11  Social History   Tobacco Use  Smoking Status Former   Packs/day: 0.75   Years: 30.00   Pack years: 22.50   Types: Cigarettes   Quit date:  06/06/1997   Years since quitting: 23.9  Smokeless Tobacco Never    Allergies  Allergen Reactions   Fish Oil Other (See Comments)    Gout   Glimepiride Other (See Comments)    REACTION: hypoglycemia   Guanfacine Hcl Other (See Comments)    REACTION: unspecified   Rosiglitazone Other (See Comments)    CHF   Objective:  There were no vitals filed for this visit. There is no height or weight on file to calculate BMI. Constitutional Well developed. Well nourished.  Vascular Dorsalis pedis pulses palpable bilaterally. Posterior tibial pulses palpable bilaterally. Capillary refill normal to all digits.  No cyanosis or clubbing noted. Pedal hair growth normal.  Neurologic Normal speech. Oriented to person, place, and time. Epicritic sensation to light touch grossly present bilaterally.  Dermatologic Improving friction blister noted to right hallux and second digit.  Limited to the breakdown of the skin.  No concern for any oral ulcers or injury.  No further redness noted circumferentially around the big toe.  No redness around the second digit.  No purulent drainage noted.  Orthopedic: Normal joint ROM without pain or crepitus bilaterally. No visible deformities. No bony tenderness.   Radiographs: None Assessment:   1. Diabetes mellitus due to underlying condition, uncontrolled, with hyperglycemia ([place])   2. Friction blister     Plan:  Patient was evaluated and treated and all questions answered.  Right second and hallux friction blister/burn injury superficial -Clinically improving at this time I believe patient will benefit transition from triple antibiotic 2 and a Band-Aid.  She can stop with a Betadine wet-to-dry dressing.  Continue using surgical shoe. -Finish her antibiotics and complete the course.  Patient states understanding -If her redness gets worse or become streaking or rash or go to the emergency room right away.  She states understanding.  -I encourage glucose  control and management.  She is a high risk for diabetic foot infection/amputation.  No follow-ups on file.

## 2021-05-12 ENCOUNTER — Other Ambulatory Visit: Payer: Self-pay | Admitting: Family Medicine

## 2021-06-07 ENCOUNTER — Other Ambulatory Visit: Payer: Self-pay

## 2021-06-07 ENCOUNTER — Ambulatory Visit: Payer: Medicare HMO | Admitting: Podiatry

## 2021-06-07 DIAGNOSIS — T148XXA Other injury of unspecified body region, initial encounter: Secondary | ICD-10-CM | POA: Diagnosis not present

## 2021-06-07 DIAGNOSIS — E0865 Diabetes mellitus due to underlying condition with hyperglycemia: Secondary | ICD-10-CM
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
## 2021-06-09 ENCOUNTER — Telehealth: Payer: Self-pay
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
## 2021-06-10 ENCOUNTER — Telehealth: Payer: Self-pay | Admitting: Family Medicine

## 2021-06-10 NOTE — Telephone Encounter (Signed)
Called pt to r/s appt with [facility] on 07/11/21 but no answer. I will call patient back at a later time.

## 2021-06-13 ENCOUNTER — Telehealth: Payer: Self-pay | Admitting: *Deleted

## 2021-06-13 NOTE — Telephone Encounter (Signed)
Patient called stating that she is needing [name] to call her regarding her insulin. Patient stated that [name] has been helping her get this through [employer] P[employer].

## 2021-06-14 NOTE — Telephone Encounter (Signed)
Pt called back returning your call °

## 2021-06-14 NOTE — Telephone Encounter (Signed)
Spoke with [name].  She states she spoke with [name] about getting her Humalog through patient assistance and she states [name] advised her that the paperwork was at our office.  Will forward message to [facility] to see what we need to do on our end.  I have not seen any paperwork come through for this.

## 2021-06-17 ENCOUNTER — Other Ambulatory Visit: Payer: Self-pay | Admitting: Family Medicine

## 2021-06-23 ENCOUNTER — Other Ambulatory Visit: Payer: Self-pay | Admitting: Family Medicine

## 2021-06-23 DIAGNOSIS — E782 Mixed hyperlipidemia: Secondary | ICD-10-CM

## 2021-06-23 DIAGNOSIS — E1122 Type 2 diabetes mellitus with diabetic chronic kidney disease: Secondary | ICD-10-CM

## 2021-06-23 DIAGNOSIS — E785 Hyperlipidemia, unspecified: Secondary | ICD-10-CM

## 2021-06-23 DIAGNOSIS — E1169 Type 2 diabetes mellitus with other specified complication: Secondary | ICD-10-CM

## 2021-06-23 DIAGNOSIS — Z79899 Other long term (current) drug therapy: Secondary | ICD-10-CM

## 2021-06-23 DIAGNOSIS — E113493 Type 2 diabetes mellitus with severe nonproliferative diabetic retinopathy without macular edema, bilateral: Secondary | ICD-10-CM | POA: Diagnosis not present

## 2021-06-23 DIAGNOSIS — IMO0002 Reserved for concepts with insufficient information to code with codable children: Secondary | ICD-10-CM

## 2021-06-23 DIAGNOSIS — E1165 Type 2 diabetes mellitus with hyperglycemia: Secondary | ICD-10-CM

## 2021-06-24 ENCOUNTER — Telehealth: Payer: Self-pay | Admitting: Family Medicine

## 2021-06-24 IMAGING — MG MM DIGITAL SCREENING BILAT W/ TOMO W/ CAD
6 of 10 series · 6 of 30 positions shown · non-contrast
Comparison: Previous exam(s).

CLINICAL DATA: Screening.

EXAM:
DIGITAL SCREENING BILATERAL MAMMOGRAM WITH TOMO AND CAD

[R MLO synth-2D (1 of 2)]
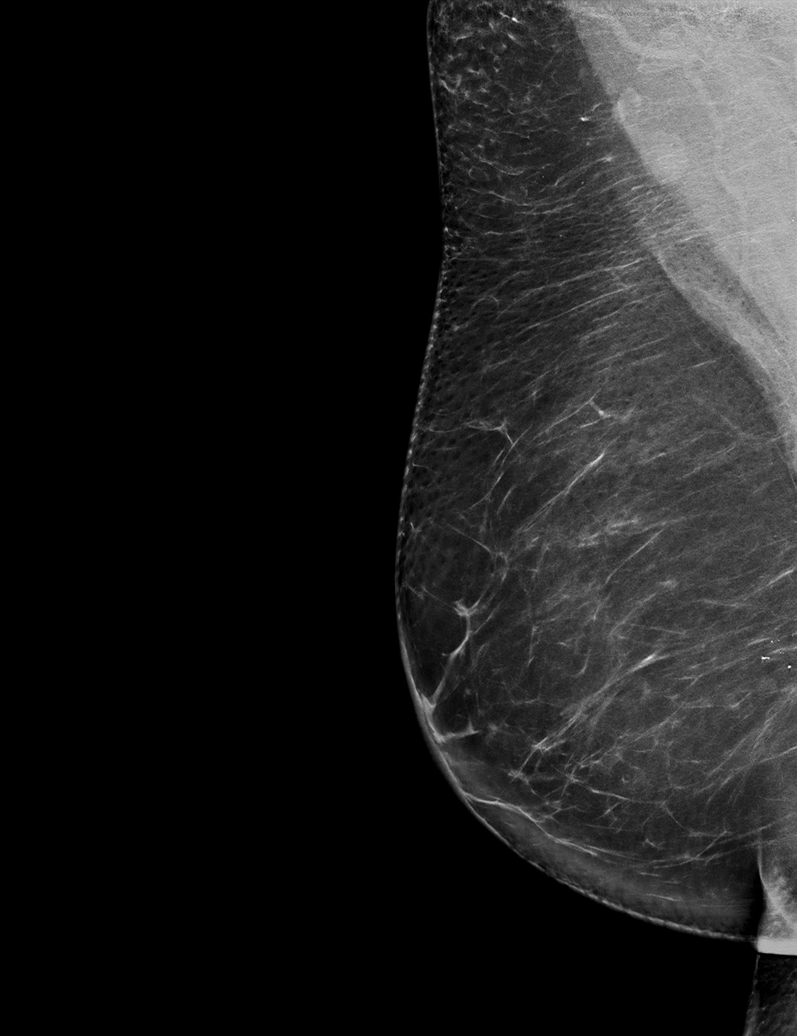

[R CC synth-2D]
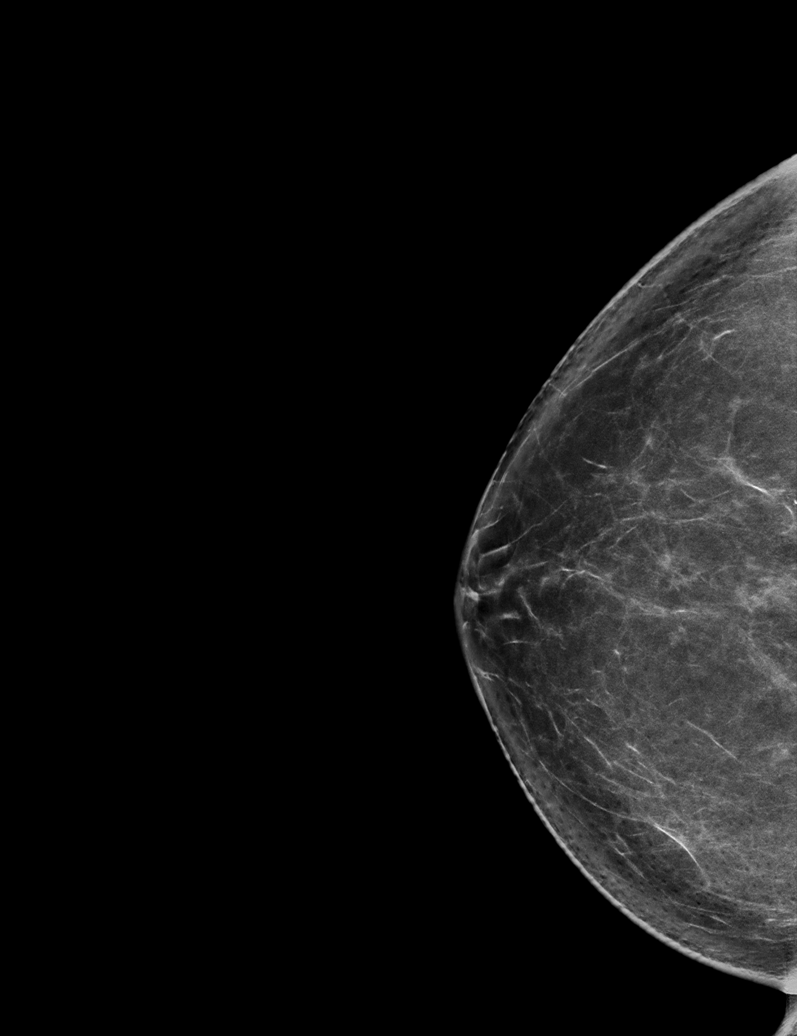

[L CC synth-2D]
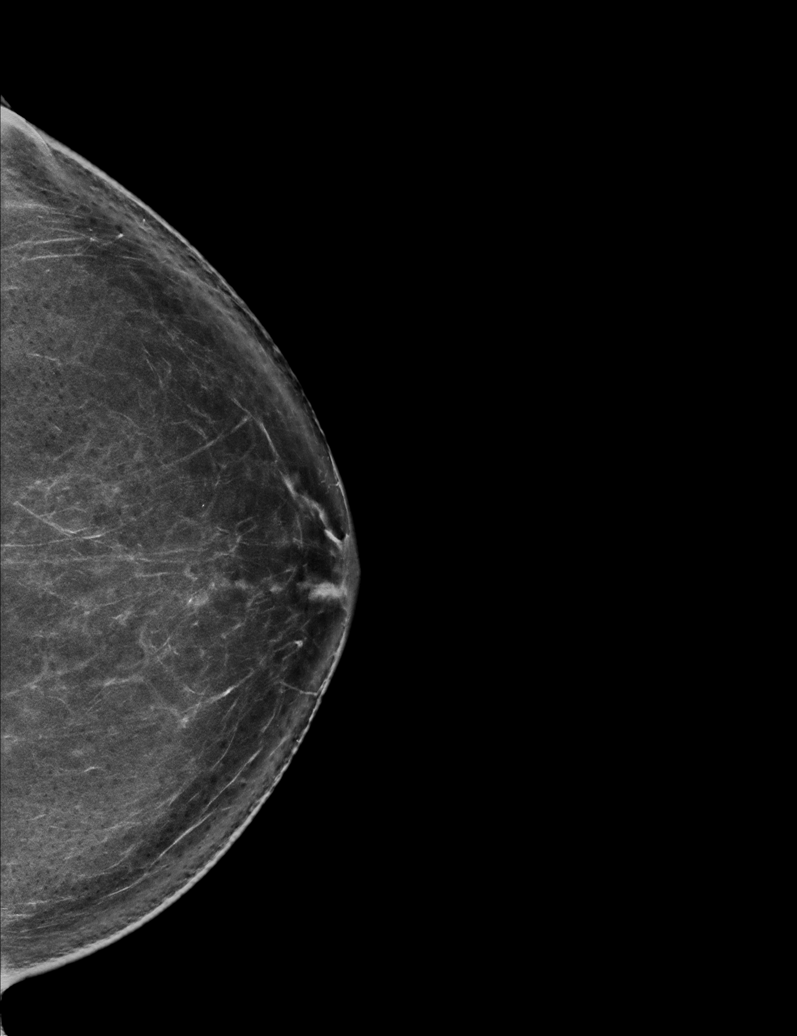

[L MLO synth-2D]
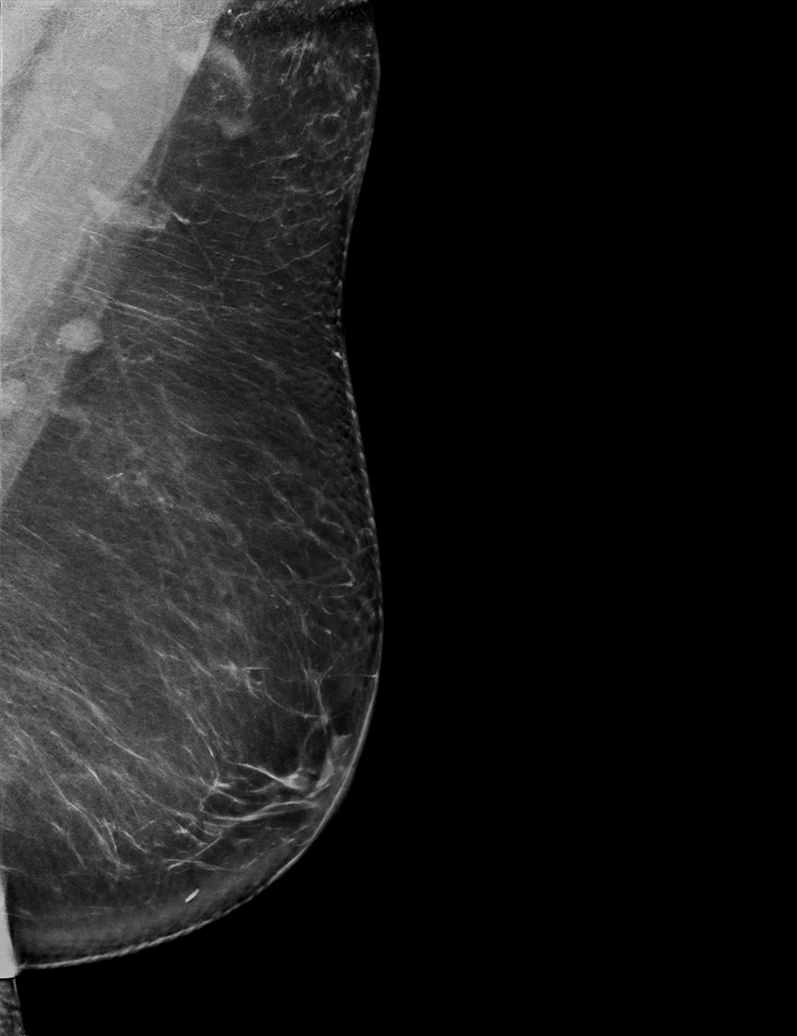

[R MLO synth-2D (2 of 2)]
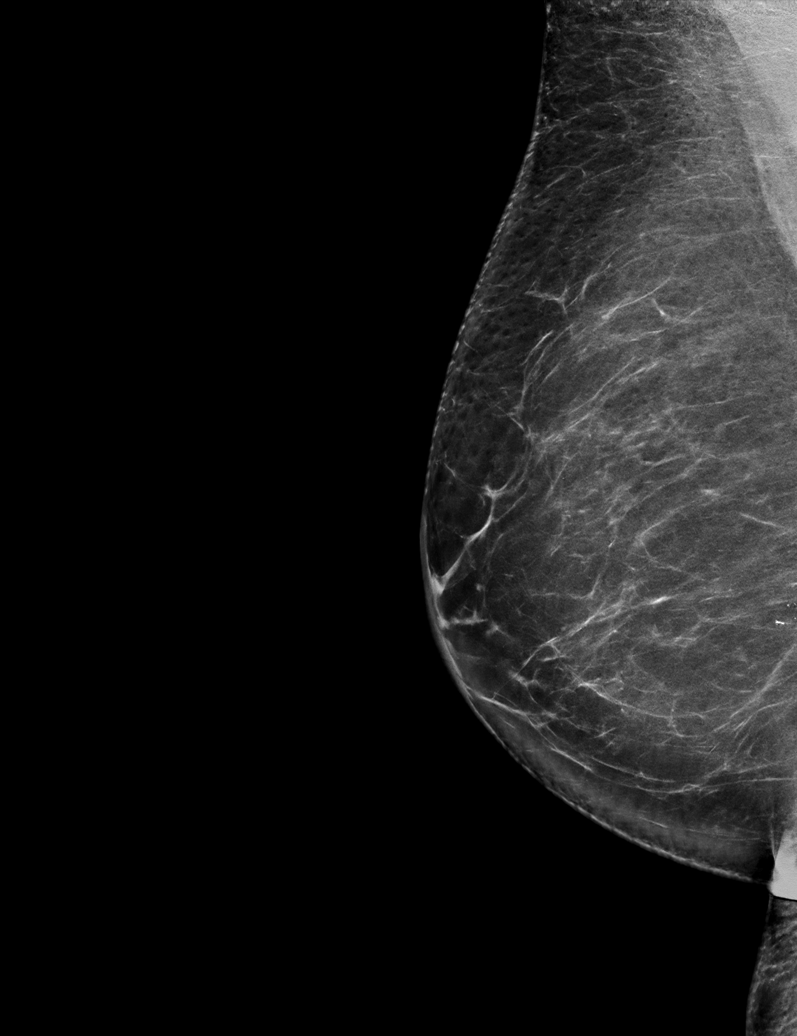

[R MLO tomo · tomo slice 45/90.0]
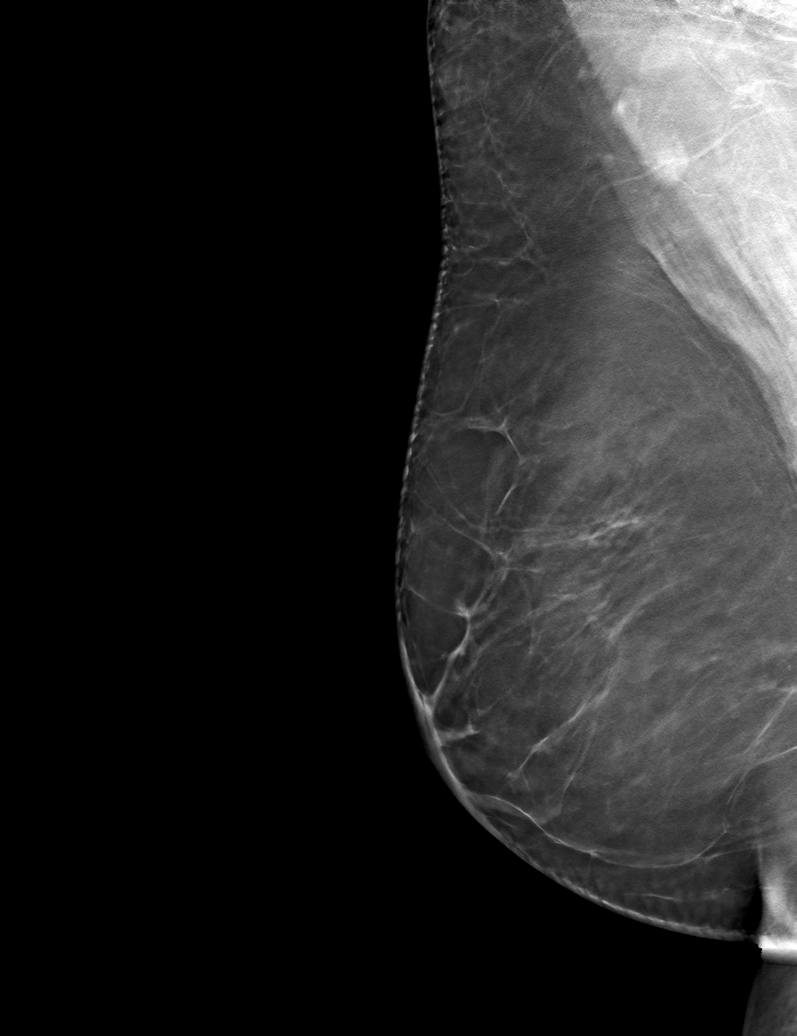

[6 of 30 positions shown; findings below may reference images not displayed]

ACR Breast Density Category b: There are scattered areas of
fibroglandular density.
FINDINGS: In the right breast, calcifications warrant further evaluation with
magnified views. In the left breast, no findings suspicious for
malignancy. Images were processed with CAD.
IMPRESSION: Further evaluation is suggested for calcifications in the right
breast.

RECOMMENDATION:
Diagnostic mammogram of the right breast. (Code:[id number])

The patient will be contacted regarding the findings, and additional
imaging will be scheduled.

BI-RADS CATEGORY  0: Incomplete. Need additional imaging evaluation
and/or prior mammograms for comparison.

## 2021-06-24 MED ORDER — INSULIN REGULAR HUMAN (CONC) 500 UNIT/ML ~~LOC~~ SOPN: 65.0000 [IU] | PEN_INJECTOR | Freq: Three times a day (TID) | SUBCUTANEOUS | 0 refills | Status: DC

## 2021-06-24 NOTE — Telephone Encounter (Signed)
Spoke with [name].  She is following up on her application for PAP for her Humalin R J-500 application.  Looks like application was completed on 05/05/21 by pharmacist [name]) and patient was suppose to come by [facility] to sign application and bring proof of income.  Per patient, she states she was not told this and is down on one pen.  At this point, we our office move, I am not sure where application would be and patient doesn't feel she can drive to [place] to sign forms. She ask that I sent in Rx for her Humulin R U-500 to [facility].  Refill sent as requested.  Will forward message to [facility] to follow up with patient on PAP application.

## 2021-06-24 NOTE — Telephone Encounter (Signed)
Pt called stating she missed a call from [place]. I did not see any documentation that anyone had tried reaching patient but I advised patient I would send a message over.

## 2021-06-27 NOTE — Telephone Encounter (Signed)
Yes application can be mailed or re-printed for her to pick up at [facility]. Will have Ria Comment call patient to see which option she prefers.  [name], PharmD Clinical Pharmacist [facility] [phone]

## 2021-06-27 NOTE — Progress Notes (Signed)
Attempted to contact patient to discuss her options on patient assistance form. Unable to reach patient and no voicemail has been set up. Application sent to [facility] front office staff to print and mail to patient.    [name], CPP notified  Margaretmary Dys, [place] Pharmacy Assistant [phone]

## 2021-06-30 ENCOUNTER — Other Ambulatory Visit: Payer: Self-pay | Admitting: Internal Medicine

## 2021-07-11 ENCOUNTER — Other Ambulatory Visit: Payer: Medicare HMO

## 2021-07-11 ENCOUNTER — Ambulatory Visit: Payer: Medicare HMO

## 2021-07-12 DIAGNOSIS — H2511 Age-related nuclear cataract, right eye: Secondary | ICD-10-CM | POA: Diagnosis not present

## 2021-07-13 ENCOUNTER — Encounter: Payer: Self-pay | Admitting: Ophthalmology

## 2021-07-14 ENCOUNTER — Telehealth: Payer: Self-pay

## 2021-07-14 NOTE — Chronic Care Management (AMB) (Addendum)
Chronic Care Management Pharmacy Assistant   Name: Kristy [name]  MRN: [id number] DOB: [birth year]  Reason for Encounter: Diabetes Disease State    Recent office visits:  None since last CCM contact  Recent consult visits:  06/07/21-Podiatry-Patient presented for follow up wound check.Discussed shoe gear modification.No medication changes 05/05/21-Podiatry- Patient presented for follow up wound check.Discontinue betadine wet to dry dressing, bandaid use only .  Hospital visits:  None in previous 6 months  Medications: Outpatient Encounter Medications as of 07/14/2021  Medication Sig   aspirin EC 81 MG EC tablet Take 1 tablet (81 mg total) by mouth daily.   carvedilol (COREG) 12.5 MG tablet Take 1 tablet (12.5 mg total) by mouth 2 (two) times daily.   chlorhexidine (PERIDEX) 0.12 % solution RINSE WITH 1 CAPFUL (15ML) FOR 30 SECONDS IN THE MORNING AND IN THE EVENING AFTER TOOTHBRUSHING DO NOT SWALLOW   Ensure (ENSURE) Take 237 mLs by mouth daily.   ENTRESTO 49-51 MG TAKE 1 TABLET BY MOUTH TWICE DAILY   Evolocumab (REPATHA SURECLICK) 256 MG/ML SOAJ Inject 1 pen into the skin every 14 (fourteen) days. (Patient not taking: Reported on 07/13/2021)   glucose blood (ONETOUCH ULTRA) test strip USE TO CHECK BLOOD SUGAR UP TO 8 TIMES ADAY   insulin regular human CONCENTRATED (HUMULIN R) 500 UNIT/ML injection Inject 65 Units into the skin 3 (three) times daily with meals.   insulin regular human CONCENTRATED (HUMULIN R) 500 UNIT/ML KwikPen Inject 65 Units into the skin 3 (three) times daily with meals.   Insulin Syringe-Needle U-100 (BD INSULIN SYRINGE U/F) 31G X 5/16" 0.5 ML MISC Use to inject insulin daily   ivabradine (CORLANOR) 5 MG TABS tablet Take 5 mg by mouth daily as needed.   LEVEMIR 100 UNIT/ML injection INJECT 30 UNITS TOTAL INTO THE SKIN TWICE DAILY AS DIRECTED.   Probiotic Product (ALIGN PO) Take 1 tablet by mouth daily.   spironolactone (ALDACTONE) 25 MG tablet Take 1 tablet (25  mg total) by mouth daily.   No facility-administered encounter medications on file as of 07/14/2021.     Recent Relevant Labs: Lab Results  Component Value Date/Time   HGBA1C 11.8 (A) 04/13/2021 02:52 PM   HGBA1C 11.7 (A) 10/04/2020 02:43 PM   HGBA1C 9.7 (H) 12/08/2019 03:00 PM   HGBA1C 10.9 (H) 07/08/2019 03:07 PM   HGBA1C >14.0 12/02/2018 12:00 AM   MICROALBUR 18.1 (H) 12/10/2018 10:08 AM   MICROALBUR 36.0 (H) 01/10/2017 03:15 PM    Kidney Function Lab Results  Component Value Date/Time   CREATININE 1.03 10/04/2020 04:19 PM   CREATININE 0.86 04/08/2020 02:47 PM   CREATININE 0.72 07/27/2014 09:02 AM   GFR 55.55 (L) 10/04/2020 04:19 PM   GFRNONAA 70 04/08/2020 02:47 PM   LSLHTDSK 87 07/27/2014 09:02 AM   GFRAA 80 04/08/2020 02:47 PM   GFRAA >89 07/27/2014 09:02 AM     Contacted patient on 07/14/21 to discuss diabetes disease state.   Current antihyperglycemic regimen:  Levemir 100unit/ml  inject 30 units 2 times daily  Humulin R -inject 65 units 3 times daily    Patient verbally confirms she is taking the above medications as directed. Yes  Attempts have been made to get PAP for insulin. The patient is unable to see to drive due to blindness and unable to locate the new temporary PCP office at this time. She is going to wait until renovations are complete and return to office then .  What diet changes have been made  to improve diabetes control?The patient reports increasing her water intake, eating smaller portions.  What recent interventions/DTPs have been made to improve glycemic control: The patient was instructed only to use 80% Levemir day of upcoming eye surgery.She will use 24 units morning of surgery. The patient also reports that the price of her insulin came down to $155 per month and she can afford the insulin at this price. She cannot see to drive to the new PCP location. She had to cancel 2 appointments due to blindness in right eye.  Have there been any  recent hospitalizations or ED visits since last visit with CPP? No  Patient denies hypoglycemic symptoms, including Pale, Sweaty, Shaky, Hungry, Nervous/irritable, and Vision changes  Patient denies hyperglycemic symptoms, including blurry vision, excessive thirst, fatigue, polyuria, and weakness  How often are you checking your blood sugar? twice daily  What are your blood sugars ranging? The patient keeps a log and has documented BG's and will bring to next appointment  Fasting: 07/14/21-207  During the week, how often does your blood glucose drop below 70? Never  Are you checking your feet daily/regularly? Yes  Adherence Review: Is the patient currently on a STATIN medication? No Is the patient currently on ACE/ARB medication? Yes Does the patient have >5 day gap between last estimated fill dates? No  Care Gaps: Annual wellness visit in last year? No  2018 Most recent A1C reading:  11.8 04/13/21  (patient reports her meter says 10.0 is average lately) Most Recent BP reading: 110/54  80-P  05/03/21  Last eye exam / retinopathy screening: 04/2021 Last diabetic foot exam: 04/15/21  Star Rating Drugs:  Medication:  Last Fill: Day Supply Entresto 49-103m 06/30/21 30 Humulin R  06/24/21 15 Levemir 100unit/ml 07/07/21 17  No appointments scheduled within the next 30 days.  MDebbora Dus CPP notified  VAvel Sensor CLauderdaleAssistant [phone] I have reviewed the care management and care coordination activities outlined in this encounter and I am certifying that I agree with the content of this note. No further action required.  MDebbora Dus PharmD Clinical Pharmacist [phone]

## 2021-07-18 ENCOUNTER — Other Ambulatory Visit: Payer: Self-pay | Admitting: Family Medicine

## 2021-07-18 ENCOUNTER — Encounter: Payer: Medicare HMO | Admitting: Family Medicine

## 2021-07-20 ENCOUNTER — Telehealth: Payer: Self-pay

## 2021-07-20 NOTE — Progress Notes (Addendum)
/     Chronic Care Management Pharmacy Assistant   Name: BRITANNY MARKSBERRY  MRN: [id number] DOB: 05-26-1951  Reason for Encounter: Patient Assistance Humulin R ([employer])    07/27/21 - Spoke with pt; she received forms yesterday, but has not filled them out. Patient will fill them out today and mail them back to [place] location. Time Spent: 5 minutes  07/25/21 - PAP reviewed, sent to [facility] to mail to patient. [name]   07/21/21 - Spoke with patient. She never got the forms in the mail. Please re-mail.   07/20/2021 - Called patient to verify she received the PAP forms. No answer; no voicemail  06/27/2021 - Mailed forms to patient  05/05/2021 - Patient assistance forms for Humulin R [employer]) completed and uploaded  [name], CPP notified  [name], [place] Assistant [phone]

## 2021-07-25 NOTE — Discharge Instructions (Signed)

## 2021-07-26 ENCOUNTER — Ambulatory Visit: Payer: Medicare HMO | Admitting: Anesthesiology

## 2021-07-26 ENCOUNTER — Encounter
Admission: RE | Disposition: A | Discharge status: Home / Self Care | Payer: Self-pay | Source: Ambulatory Visit | Attending: Ophthalmology

## 2021-07-26 ENCOUNTER — Other Ambulatory Visit: Payer: Self-pay

## 2021-07-26 ENCOUNTER — Encounter: Payer: Self-pay | Admitting: Ophthalmology

## 2021-07-26 ENCOUNTER — Ambulatory Visit
Admission: RE | Admit: 2021-07-26 | Discharge: 2021-07-26 | Disposition: A | Discharge status: Home / Self Care | Payer: Medicare HMO | Source: Ambulatory Visit | Attending: Ophthalmology | Admitting: Ophthalmology

## 2021-07-26 DIAGNOSIS — Z6829 Body mass index (BMI) 29.0-29.9, adult: Secondary | ICD-10-CM | POA: Insufficient documentation

## 2021-07-26 DIAGNOSIS — Z87891 Personal history of nicotine dependence: Secondary | ICD-10-CM | POA: Insufficient documentation

## 2021-07-26 DIAGNOSIS — H25811 Combined forms of age-related cataract, right eye: Secondary | ICD-10-CM | POA: Diagnosis not present

## 2021-07-26 DIAGNOSIS — H2511 Age-related nuclear cataract, right eye: Secondary | ICD-10-CM | POA: Insufficient documentation

## 2021-07-26 HISTORY — PX: CATARACT EXTRACTION W/PHACO: SHX586

## 2021-07-26 HISTORY — DX: Presence of dental prosthetic device (complete) (partial): Z97.2

## 2021-07-26 LAB — GLUCOSE, CAPILLARY
Glucose-Capillary: 176 mg/dL — ABNORMAL HIGH (ref 70–99)
Glucose-Capillary: 216 mg/dL — ABNORMAL HIGH (ref 70–99)

## 2021-07-26 SURGERY — PHACOEMULSIFICATION, CATARACT, WITH IOL INSERTION
Anesthesia: Monitor Anesthesia Care | Site: Eye | Laterality: Right

## 2021-07-26 MED ORDER — FENTANYL CITRATE (PF) 100 MCG/2ML IJ SOLN
INTRAMUSCULAR | Status: DC | PRN
  Administered 2021-07-26: 50 ug via INTRAVENOUS

## 2021-07-26 MED ORDER — SIGHTPATH DOSE#1 BSS IO SOLN
INTRAOCULAR | Status: DC | PRN
  Administered 2021-07-26: 1 mL via INTRAMUSCULAR

## 2021-07-26 MED ORDER — TETRACAINE HCL 0.5 % OP SOLN
1.0000 [drp] | OPHTHALMIC | Status: DC | PRN
  Administered 2021-07-26 (×3): 1 [drp] via OPHTHALMIC

## 2021-07-26 MED ORDER — MIDAZOLAM HCL 2 MG/2ML IJ SOLN
INTRAMUSCULAR | Status: DC | PRN
  Administered 2021-07-26: .5 mg via INTRAVENOUS
  Administered 2021-07-26: 1.5 mg via INTRAVENOUS

## 2021-07-26 MED ORDER — SIGHTPATH DOSE#1 NA CHONDROIT SULF-NA HYALURON 40-17 MG/ML IO SOLN
INTRAOCULAR | Status: DC | PRN
  Administered 2021-07-26: 1 mL via INTRAOCULAR

## 2021-07-26 MED ORDER — ARMC OPHTHALMIC DILATING DROPS
1.0000 "application " | OPHTHALMIC | Status: DC | PRN
  Administered 2021-07-26 (×3): 1 via OPHTHALMIC

## 2021-07-26 MED ORDER — LACTATED RINGERS IV SOLN: INTRAVENOUS | Status: DC

## 2021-07-26 MED ORDER — SIGHTPATH DOSE#1 BSS IO SOLN
INTRAOCULAR | Status: DC | PRN
  Administered 2021-07-26: 117 mL via OPHTHALMIC

## 2021-07-26 MED ORDER — OXYCODONE HCL 5 MG PO TABS: 5.0000 mg | ORAL_TABLET | Freq: Once | ORAL | Status: DC | PRN

## 2021-07-26 MED ORDER — MOXIFLOXACIN HCL 0.5 % OP SOLN
OPHTHALMIC | Status: DC | PRN
  Administered 2021-07-26: 0.2 mL via OPHTHALMIC

## 2021-07-26 MED ORDER — SIGHTPATH DOSE#1 BSS IO SOLN
INTRAOCULAR | Status: DC | PRN
  Administered 2021-07-26: 15 mL

## 2021-07-26 MED ORDER — OXYCODONE HCL 5 MG/5ML PO SOLN: 5.0000 mg | Freq: Once | ORAL | Status: DC | PRN

## 2021-07-26 MED ORDER — BRIMONIDINE TARTRATE-TIMOLOL 0.2-0.5 % OP SOLN
OPHTHALMIC | Status: DC | PRN
  Administered 2021-07-26: 1 [drp] via OPHTHALMIC

## 2021-07-26 SURGICAL SUPPLY — 13 items
CANNULA ANT/CHMB 27GA (MISCELLANEOUS) ×2 IMPLANT
GLOVE SURG ENC TEXT LTX SZ8 (GLOVE) ×2 IMPLANT
GLOVE SURG TRIUMPH 8.0 PF LTX (GLOVE) ×2 IMPLANT
GOWN STRL REUS W/ TWL LRG LVL3 (GOWN DISPOSABLE) ×2 IMPLANT
GOWN STRL REUS W/TWL LRG LVL3 (GOWN DISPOSABLE) ×4
LENS IOL TECNIS EYHANCE 22.5 (Intraocular Lens) ×2 IMPLANT
MARKER SKIN DUAL TIP RULER LAB (MISCELLANEOUS) ×2 IMPLANT
NEEDLE FILTER BLUNT 18X 1/2SAF (NEEDLE) ×1
NEEDLE FILTER BLUNT 18X1 1/2 (NEEDLE) ×1 IMPLANT
SYR 3ML LL SCALE MARK (SYRINGE) ×2 IMPLANT
SYR TB 1ML LUER SLIP (SYRINGE) ×2 IMPLANT
WATER STERILE IRR 250ML POUR (IV SOLUTION) ×2 IMPLANT
WIPE NON LINTING 3.25X3.25 (MISCELLANEOUS) ×2 IMPLANT

## 2021-07-26 NOTE — Anesthesia Postprocedure Evaluation (Signed)
Anesthesia Post Note  Patient: Kristy [name]  Procedure(s) Performed: CATARACT EXTRACTION PHACO AND INTRAOCULAR LENS PLACEMENT (IOC) RIGHT DIABETIC (Right: Eye)     Patient location during evaluation: PACU Anesthesia Type: MAC Level of consciousness: awake and alert Pain management: pain level controlled Vital Signs Assessment: post-procedure vital signs reviewed and stable Respiratory status: spontaneous breathing, nonlabored ventilation, respiratory function stable and patient connected to nasal cannula oxygen Cardiovascular status: stable and blood pressure returned to baseline Postop Assessment: no apparent nausea or vomiting Anesthetic complications: no   No notable events documented.  Fidel Levy

## 2021-07-26 NOTE — Anesthesia Procedure Notes (Signed)
Date/Time: 07/26/2021 10:33 AM Performed by: Mayme Genta, CRNA Pre-anesthesia Checklist: Patient identified, Emergency Drugs available, Suction available, Timeout performed and Patient being monitored Patient Re-evaluated:Patient Re-evaluated prior to induction Oxygen Delivery Method: Nasal cannula Placement Confirmation: positive ETCO2

## 2021-07-26 NOTE — H&P (Signed)
[facility]   Primary Care Physician:  Owens Loffler, MD Ophthalmologist: Dr. George Ina  Pre-Procedure History & Physical: HPI:  Kristy [name] is a 70 y.o. female here for cataract surgery.   Past Medical History:  Diagnosis Date   Allergic rhinitis    Allergy    Cataract    mild    Crohn's disease ([place]) 10/13/2013   Diverticulosis    GERD (gastroesophageal reflux disease)    Gout    HFrEF (heart failure with reduced ejection fraction) ([place])    a. 12/2008 Cath: EF 45% w/ inf HK; b. 07/2009 Echo: EF 50-55%; c. 08/2018 Echo: EF 20-25%.   Hyperlipidemia    Hypertension    IBS (irritable bowel syndrome)    Left bundle branch block    Neuromuscular disorder (HCC)    neuropathy   NICM (nonischemic cardiomyopathy) ([place])    a. 12/2008 Cath: no significant dzs, EF 45% w/ inf HK->Med Rx; b. 07/2009 Echo: EF 50-55%; c. 08/2018 Echo: EF 20-25%, ant/antsept HK, mild MR, mildly dil LA, nl RV fx; d. 08/2018 Cath: D1 80, otw nonobs dzs->Med rx.   Non-obstructive CAD (coronary artery disease)    a. 12/2008 Cath: no significant dzs, EF 45% w/ inf HK->Med Rx; b. 08/2018 Cath: LM nl, LAD min irregs, D1 80, LCX 2md, RCA min irregs->Med Rx.   Osteoarthritis    Patient has active power of attorney for health care: TOretha Caprice01/11/2020   Poorly controlled Diabetes mellitus    a. 07/2018 A1c 13.1.   Symptomatic cholelithiasis    Uncontrolled type 2 diabetes mellitus with stage 3 chronic kidney disease, with long-term current use of insulin         Wears dentures    full upper    Past Surgical History:  Procedure Laterality Date   BREAST BIOPSY Right 06/24/2019   stereo bx/ x clip/ neg   BREAST CYST ASPIRATION     CARDIAC CATHETERIZATION     COLONOSCOPY     DILATION AND CURETTAGE OF UTERUS     LEFT HEART CATH AND CORONARY ANGIOGRAPHY N/A 04/24/2019   Procedure: LEFT HEART CATH AND CORONARY ANGIOGRAPHY;  Surgeon: ENelva Bush MD;  [facility];  Service:  Cardiovascular;  Laterality: N/A;   RIGHT/LEFT HEART CATH AND CORONARY ANGIOGRAPHY N/A 08/26/2018   Procedure: RIGHT/LEFT HEART CATH AND CORONARY ANGIOGRAPHY;  Surgeon: AWellington Hampshire MD;  [facility];  Service: Cardiovascular;  Laterality: N/A;   SHOULDER SURGERY     15 + yrs ago    SKIN SURGERY     nose    VAGINAL HYSTERECTOMY      Prior to Admission medications   Medication Sig Start Date End Date Taking? Authorizing Provider  aspirin EC 81 MG EC tablet Take 1 tablet (81 mg total) by mouth daily. 04/29/19  Yes Sudini, SAlveta Heimlich MD  carvedilol (COREG) 12.5 MG tablet Take 1 tablet (12.5 mg total) by mouth 2 (two) times daily. 05/03/21  Yes Gollan, TKathlene November MD  chlorhexidine (PERIDEX) 0.12 % solution RINSE WITH 1 CAPFUL (15ML) FOR 30 SECONDS IN THE MORNING AND IN THE EVENING AFTER TOOTHBRUSHING DO NOT SWALLOW 02/10/20  Yes [provider]  Ensure (ENSURE) Take 237 mLs by mouth daily.   Yes [provider]  ENTRESTO 49-51 MG TAKE 1 TABLET BY MOUTH TWICE DAILY 07/01/21  Yes Gollan, TKathlene November MD  HUMULIN R U-500 KWIKPEN 500 UNIT/ML KwikPen INJECT 65 UNITS INTO THE SKIN 3 TIMES DAILY  WITH MEALS. 07/18/21  Yes Copland, [name], MD  insulin regular human CONCENTRATED (HUMULIN R) 500 UNIT/ML injection Inject 65 Units into the skin 3 (three) times daily with meals.   Yes [provider]  ivabradine (CORLANOR) 5 MG TABS tablet Take 5 mg by mouth daily as needed.   Yes [provider]  LEVEMIR 100 UNIT/ML injection INJECT 30 UNITS TOTAL INTO THE SKIN TWICE DAILY AS DIRECTED. 06/17/21  Yes Copland, [name], MD  Probiotic Product (ALIGN PO) Take 1 tablet by mouth daily.   Yes [provider]  spironolactone (ALDACTONE) 25 MG tablet Take 1 tablet (25 mg total) by mouth daily. 05/03/21  Yes Gollan, Kathlene November, MD  Evolocumab (REPATHA SURECLICK) 030 MG/ML SOAJ Inject 1 pen into the skin every 14 (fourteen) days. Patient not taking: Reported on  07/13/2021 05/03/21   Minna Merritts, MD  glucose blood (ONETOUCH ULTRA) test strip USE TO CHECK BLOOD SUGAR UP TO 8 TIMES ADAY 06/28/20   Copland, [name], MD  Insulin Syringe-Needle U-100 (BD INSULIN SYRINGE U/F) 31G X 5/16" 0.5 ML MISC Use to inject insulin daily 01/24/21   Copland, [name], MD    Allergies as of 06/27/2021 - Review Complete 06/07/2021  Allergen Reaction Noted   Fish oil Other (See Comments) 03/11/2015   Glimepiride Other (See Comments) 10/31/2006   Guanfacine hcl Other (See Comments) 10/31/2006   Rosiglitazone Other (See Comments) 03/11/2015    Family History  Problem Relation Age of Onset   Kidney failure Mother    Hypertension Father    Kidney failure Brother    Colon cancer Neg Hx    Colon polyps Neg Hx    Rectal cancer Neg Hx    Stomach cancer Neg Hx    Breast cancer Neg Hx     Social History   Socioeconomic History   Marital status: Widowed    Spouse name: Not on file   Number of children: Not on file   Years of education: Not on file   Highest education level: Not on file  Occupational History   Not on file  Tobacco Use   Smoking status: Former    Packs/day: 0.75    Years: 30.00    Pack years: 22.50    Types: Cigarettes    Quit date: 06/06/1997    Years since quitting: 24.1   Smokeless tobacco: Never  Vaping Use   Vaping Use: Never used  Substance and Sexual Activity   Alcohol use: No   Drug use: No   Sexual activity: Yes  Other Topics Concern   Not on file  Social History Narrative   Not on file   Social Determinants of Health   Financial Resource Strain: Low Risk    Difficulty of Paying Living Expenses: Not very hard  Food Insecurity: Not on file  Transportation Needs: Not on file  Physical Activity: Not on file  Stress: Not on file  Social Connections: Not on file  Intimate Partner Violence: Not on file    Review of Systems: See HPI, otherwise negative ROS  Physical Exam: BP (!) 161/88   Pulse 76   Temp (!) 97.2 F  (36.2 C) (Temporal)   Ht 5' 3"  (1.6 m)   Wt 75.3 kg   SpO2 97%   BMI 29.41 kg/m  General:   Alert, cooperative in NAD Head:  Normocephalic and atraumatic. Respiratory:  Normal work of breathing. Cardiovascular:  RRR  Impression/Plan: Kristy [name] is here for cataract surgery.  Risks, benefits,  limitations, and alternatives regarding cataract surgery have been reviewed with the patient.  Questions have been answered.  All parties agreeable.   Birder Robson, MD  07/26/2021, 10:19 AM

## 2021-07-26 NOTE — Transfer of Care (Signed)
Immediate Anesthesia Transfer of Care Note  Patient: Kristy [name]  Procedure(s) Performed: CATARACT EXTRACTION PHACO AND INTRAOCULAR LENS PLACEMENT (IOC) RIGHT DIABETIC (Right: Eye)  Patient Location: PACU  Anesthesia Type: MAC  Level of Consciousness: awake, alert  and patient cooperative  Airway and Oxygen Therapy: Patient Spontanous Breathing and Patient connected to supplemental oxygen  Post-op Assessment: Post-op Vital signs reviewed, Patient's Cardiovascular Status Stable, Respiratory Function Stable, Patent Airway and No signs of Nausea or vomiting  Post-op Vital Signs: Reviewed and stable  Complications: No notable events documented.

## 2021-07-26 NOTE — Anesthesia Preprocedure Evaluation (Signed)
Anesthesia Evaluation  Patient identified by MRN, date of birth, ID band Patient awake    Reviewed: NPO status   History of Anesthesia Complications Negative for: history of anesthetic complications  Airway Mallampati: II  TM Distance: >3 FB Neck ROM: full    Dental  (+) Upper Dentures   Pulmonary neg pulmonary ROS, former smoker,    Pulmonary exam normal        Cardiovascular Exercise Tolerance: Good hypertension, (-) angina+ CAD (severe single-vessel disease, medical management), + Past MI (? 2020) and +CHF (Nonischemic cardiomyopathy; )  Normal cardiovascular exam+ dysrhythmias (LBBB)   echo: 05/2020: 1. Left ventricular ejection fraction, by estimation, is 55 to 60%. The  left ventricle has normal function. The left ventricle has no regional  wall motion abnormalities. Left ventricular diastolic parameters are  consistent with Grade I diastolic  dysfunction (impaired relaxation). Elevated left atrial pressure.  2. Right ventricular systolic function is mildly reduced. The right  ventricular size is normal. Tricuspid regurgitation signal is inadequate  for assessing PA pressure.  3. The mitral valve was not well visualized. Trivial mitral valve  regurgitation. No evidence of mitral stenosis.  4. The aortic valve is tricuspid. Aortic valve regurgitation is not  visualized. No aortic stenosis is present.   ekg: 05/2021: nsr; lbbb;   Neuro/Psych PSYCHIATRIC DISORDERS Anxiety Depression Neuropathy : feet CVA (? 2020), No Residual Symptoms    GI/Hepatic Neg liver ROS, GERD  Controlled,Crohn's dz / IBS   Endo/Other  diabetesMorbid obesity (bmi 29)  Renal/GU CRFRenal disease (ckd2)  negative genitourinary   Musculoskeletal  (+) Arthritis ,   Abdominal   Peds  Hematology negative hematology ROS (+)   Anesthesia Other Findings cards: 05/2021: Candis Musa;  2020: ketoacidosis : ? MI ?CVA; intubated > top teeth  injured.  Hx Atypical chest pain : on Corlanor; No recent chest pain.  Reproductive/Obstetrics                             Anesthesia Physical Anesthesia Plan  ASA: 3  Anesthesia Plan: MAC   Post-op Pain Management:    Induction:   PONV Risk Score and Plan: 2 and Midazolam and TIVA  Airway Management Planned:   Additional Equipment:   Intra-op Plan:   Post-operative Plan:   Informed Consent: I have reviewed the patients History and Physical, chart, labs and discussed the procedure including the risks, benefits and alternatives for the proposed anesthesia with the patient or authorized representative who has indicated his/her understanding and acceptance.       Plan Discussed with: CRNA  Anesthesia Plan Comments:         Anesthesia Quick Evaluation

## 2021-07-26 NOTE — Op Note (Signed)
PREOPERATIVE DIAGNOSIS:  Nuclear sclerotic cataract of the right eye.   POSTOPERATIVE DIAGNOSIS:  H25.11 Cataract   OPERATIVE PROCEDURE:ORPROCALL@   SURGEON:  Birder Robson, MD.   ANESTHESIA:  Anesthesiologist: Fidel Levy, MD CRNA: Mayme Genta, CRNA  1.      Managed anesthesia care. 2.      0.44m of Shugarcaine was instilled in the eye following the paracentesis.   COMPLICATIONS:  None.   TECHNIQUE:   Stop and chop   DESCRIPTION OF PROCEDURE:  The patient was examined and consented in the preoperative holding area where the aforementioned topical anesthesia was applied to the right eye and then brought back to the Operating Room where the right eye was prepped and draped in the usual sterile ophthalmic fashion and a lid speculum was placed. A paracentesis was created with the side port blade and the anterior chamber was filled with viscoelastic. A near clear corneal incision was performed with the steel keratome. A continuous curvilinear capsulorrhexis was performed with a cystotome followed by the capsulorrhexis forceps. Hydrodissection and hydrodelineation were carried out with BSS on a blunt cannula. The lens was removed in a stop and chop  technique and the remaining cortical material was removed with the irrigation-aspiration handpiece. The capsular bag was inflated with viscoelastic and the Technis [id number]  lens was placed in the capsular bag without complication. The remaining viscoelastic was removed from the eye with the irrigation-aspiration handpiece. The wounds were hydrated. The anterior chamber was flushed with BSS and the eye was inflated to physiologic pressure. 0.167mof Vigamox was placed in the anterior chamber. The wounds were found to be water tight. The eye was dressed with Combigan. The patient was given protective glasses to wear throughout the day and a shield with which to sleep tonight. The patient was also given drops with which to begin a drop regimen today  and will follow-up with me in one day. Implant Name Type Inv. Item Serial No. Manufacturer Lot No. LRB No. Used Action  LENS IOL TECNIS EYHANCE 22.5 - [id number] Lens LENS IOL TECNIS EYHANCE 22.5 [id number]   Right 1 Implanted   Procedure(s) with comments: CATARACT EXTRACTION PHACO AND INTRAOCULAR LENS PLACEMENT (IOC) RIGHT DIABETIC (Right) - Diabetic 8.99 01:10.2  Electronically signed: WiBirder Robson0/25/2022 10:53 AM

## 2021-07-27 ENCOUNTER — Encounter: Payer: Self-pay | Admitting: Ophthalmology

## 2021-07-28 DIAGNOSIS — R42 Dizziness and giddiness: Secondary | ICD-10-CM | POA: Diagnosis not present

## 2021-07-28 DIAGNOSIS — E663 Overweight: Secondary | ICD-10-CM | POA: Diagnosis not present

## 2021-07-28 DIAGNOSIS — Z6829 Body mass index (BMI) 29.0-29.9, adult: Secondary | ICD-10-CM | POA: Diagnosis not present

## 2021-07-28 DIAGNOSIS — I509 Heart failure, unspecified: Secondary | ICD-10-CM | POA: Diagnosis not present

## 2021-07-28 DIAGNOSIS — E1065 Type 1 diabetes mellitus with hyperglycemia: Secondary | ICD-10-CM | POA: Diagnosis not present

## 2021-07-28 DIAGNOSIS — I110 Hypertensive heart disease with heart failure: Secondary | ICD-10-CM | POA: Diagnosis not present

## 2021-07-28 DIAGNOSIS — Z7722 Contact with and (suspected) exposure to environmental tobacco smoke (acute) (chronic): Secondary | ICD-10-CM | POA: Diagnosis not present

## 2021-07-28 DIAGNOSIS — I252 Old myocardial infarction: Secondary | ICD-10-CM | POA: Diagnosis not present

## 2021-07-28 DIAGNOSIS — E1036 Type 1 diabetes mellitus with diabetic cataract: Secondary | ICD-10-CM | POA: Diagnosis not present

## 2021-07-28 DIAGNOSIS — I2510 Atherosclerotic heart disease of native coronary artery without angina pectoris: Secondary | ICD-10-CM | POA: Diagnosis not present

## 2021-07-29 ENCOUNTER — Telehealth: Payer: Self-pay | Admitting: Family Medicine

## 2021-07-29 NOTE — Telephone Encounter (Signed)
Patient needs to be seen for AWV with Dr. Lorelei Pont please schedule.

## 2021-08-01 ENCOUNTER — Telehealth: Payer: Self-pay | Admitting: Family Medicine

## 2021-08-01 NOTE — Telephone Encounter (Signed)
Called patient and got her schedule for 10/19/21 at 1020. Same day labs due to she is driving herself

## 2021-08-01 NOTE — Telephone Encounter (Signed)
Mrs. Jobina called in and stated that she has lost her forms for her insulin. And wanted to know about getting more sent to her again.

## 2021-08-08 ENCOUNTER — Other Ambulatory Visit: Payer: Self-pay | Admitting: Cardiovascular Disease

## 2021-08-09 ENCOUNTER — Ambulatory Visit: Payer: Medicare HMO | Admitting: Podiatry

## 2021-08-09 ENCOUNTER — Other Ambulatory Visit: Payer: Self-pay | Admitting: Family Medicine

## 2021-08-11 ENCOUNTER — Other Ambulatory Visit: Payer: Self-pay | Admitting: Family Medicine

## 2021-08-12 ENCOUNTER — Telehealth: Payer: Self-pay

## 2021-08-12 NOTE — Progress Notes (Addendum)
    Chronic Care Management Pharmacy Assistant   Name: AUDRINNA SHERMAN  MRN: [id number] DOB: 13-Nov-1950  Reason for Encounter: CCM (Humulin R Patient Assistance)   08/12/21 - Called patient to inquire about forms; no answer; no voicemail. Called [employer]; no paperwork on file. Forms can be faxed to [phone]  Time Spent: 10 Minutes  07/27/21 - Spoke with pt; she received forms yesterday, but has not filled them out. Patient will fill them out today and mail them back to [place] location. Time Spent: 5 minutes   07/25/21 - PAP reviewed, sent to [facility] to mail to patient. [name]    07/21/21 - Spoke with patient. She never got the forms in the mail. Please re-mail.    07/20/2021 - Called patient to verify she received the PAP forms. No answer; no voicemail   06/27/2021 - Mailed forms to patient   05/05/2021 - Patient assistance forms for Humulin R [employer]) completed and uploaded    [name], CPP notified  [name], [place] Assistant [phone]

## 2021-08-22 NOTE — Progress Notes (Addendum)
08/22/2021 - Called [employer] to check on status of patient assistance for Humulin R; they do not have the new prescription in their que as of yet.   08/19/2021 - Called [employer]; they need an updated prescription to include strength and maximum daily dose. New prescription can be faxed to [phone]. Please be sure to include patients full name and date of birth on fax cover sheet.   [name], CPP notified  [name], [place] Clinical Pharmacy Assistant [phone]  Time Spent: 10 minutes

## 2021-08-29 NOTE — Telephone Encounter (Signed)
Sent application to [place] for signatures. Please have Dr. Lorelei Pont sign page 5 and 6 only. Then fax page 5-6 to [facility]. Thank you!  [name], PharmD Clinical Pharmacist [place] [facility] [phone]

## 2021-08-29 NOTE — Telephone Encounter (Signed)
Asked Amy for more specific information as to what [employer] needed. She will update me.  [name], PharmD Clinical Pharmacist [place] [facility] at [facility] [phone]

## 2021-08-29 NOTE — Telephone Encounter (Signed)
Placed forms on Dr [employer] desk to sign when back in the office

## 2021-08-30 ENCOUNTER — Other Ambulatory Visit: Payer: Self-pay | Admitting: Family Medicine

## 2021-08-31 DIAGNOSIS — E113493 Type 2 diabetes mellitus with severe nonproliferative diabetic retinopathy without macular edema, bilateral: Secondary | ICD-10-CM | POA: Diagnosis not present

## 2021-08-31 NOTE — Telephone Encounter (Signed)
Form faxed now as requested

## 2021-09-06 ENCOUNTER — Telehealth: Payer: Self-pay

## 2021-09-06 NOTE — Progress Notes (Signed)
    Chronic Care Management Pharmacy Assistant   Name: Kristy [name]  MRN: [id number] DOB: 1950-11-21  Reason for Encounter: CCM (Appointment Reminder)   Medications: Outpatient Encounter Medications as of 09/06/2021  Medication Sig   aspirin EC 81 MG EC tablet Take 1 tablet (81 mg total) by mouth daily.   carvedilol (COREG) 12.5 MG tablet TAKE 1 TABLET BY MOUTH TWICE DAILY   chlorhexidine (PERIDEX) 0.12 % solution RINSE WITH 1 CAPFUL (15ML) FOR 30 SECONDS IN THE MORNING AND IN THE EVENING AFTER TOOTHBRUSHING DO NOT SWALLOW   Ensure (ENSURE) Take 237 mLs by mouth daily.   ENTRESTO 49-51 MG TAKE 1 TABLET BY MOUTH TWICE DAILY   Evolocumab (REPATHA SURECLICK) 826 MG/ML SOAJ Inject 1 pen into the skin every 14 (fourteen) days. (Patient not taking: Reported on 07/13/2021)   glucose blood (ONETOUCH ULTRA) test strip USE TO CHECK BLOOD SUGAR UP TO 8 TIMES DAILY   HUMULIN R U-500 KWIKPEN 500 UNIT/ML KwikPen INJECT 65 UNITS INTO THE SKIN 3 TIMES DAILY WITH MEALS.   insulin regular human CONCENTRATED (HUMULIN R) 500 UNIT/ML injection Inject 65 Units into the skin 3 (three) times daily with meals.   Insulin Syringe-Needle U-100 (BD INSULIN SYRINGE U/F) 31G X 5/16" 0.5 ML MISC Use to inject insulin daily   ivabradine (CORLANOR) 5 MG TABS tablet Take 5 mg by mouth daily as needed.   LEVEMIR 100 UNIT/ML injection INJECT 30 UNITS TOTAL INTO THE SKIN TWICE DAILY AS DIRECTED.   Probiotic Product (ALIGN PO) Take 1 tablet by mouth daily.   spironolactone (ALDACTONE) 25 MG tablet Take 1 tablet (25 mg total) by mouth daily.   No facility-administered encounter medications on file as of 09/06/2021.   [name] was contacted to remind her of her upcoming telephone visit with [name] on 09/12/2021 at 2:30 pm. Patient was reminded to have all medications, supplements and any blood glucose and blood pressure readings available for review at appointment.  Are you having any problems with your  medications? No  Do you have any concerns you like to discuss with the pharmacist? Yes Insulin was costing her $500 a month and now it is down to $33. She no longer needs patient assistance.   Star Rating Drugs: Medication:  Last Fill: Day Supply Humulin R 500 units 08/30/2021 15 - No refills remaining Levemir 100 units 08/26/2021 17 - 1 refill remaining Entresto 49-51 mg 09/02/2021 30 - 10 refills remaining through 06/2022  Fill dates verified with [place], CPP notified  [name], [place] [phone]  Time Spent:  12 Minutes

## 2021-09-07 NOTE — Telephone Encounter (Addendum)
Patient approved for assistance Humulin R U-500 though end of 2023.

## 2021-09-12 ENCOUNTER — Other Ambulatory Visit: Payer: Self-pay

## 2021-09-12 ENCOUNTER — Ambulatory Visit: Payer: Medicare HMO

## 2021-09-12 DIAGNOSIS — G720 Drug-induced myopathy: Secondary | ICD-10-CM

## 2021-09-12 DIAGNOSIS — E785 Hyperlipidemia, unspecified: Secondary | ICD-10-CM

## 2021-09-12 DIAGNOSIS — E1169 Type 2 diabetes mellitus with other specified complication: Secondary | ICD-10-CM

## 2021-09-12 DIAGNOSIS — I10 Essential (primary) hypertension: Secondary | ICD-10-CM

## 2021-09-12 NOTE — Patient Instructions (Signed)
Dear Kristy [name],  Below is a summary of the goals we discussed during our follow up appointment on September 12, 2021. Please contact me anytime with questions or concerns.   Visit Information  Patient Care Plan: General Pharmacy (Adult)     Problem Identified: Disease Management   Priority: High  Note:    Current Barriers:  Unable to achieve control of diabetes and cholesterol  Pharmacist Clinical Goal(s):  Patient will contact provider office for questions/concerns as evidenced notation of same in electronic health record through collaboration with PharmD and provider.   Interventions: 1:1 collaboration with Owens Loffler, MD regarding development and update of comprehensive plan of care as evidenced by provider attestation and co-signature Inter-disciplinary care team collaboration (see longitudinal plan of care) Comprehensive medication review performed; medication list updated in electronic medical record  Hyperlipidemia/CAD: (LDL goal < 70) -Uncontrolled, LDL 177 (10/2020), recent interventions include cardiologist started [place]. Pt no longer taking. She did not tolerate. -Current treatment: No pharmacotherapy -Medications previously tried: statins - multiple intolerances, Zetia, Repatha - felt awful for 14 days  -Educated on Cholesterol goals, risks/benefits of Repatha discussed -Counseled on diet and exercise extensively; Pt states she will not take cholesterol medications given history of poor tolerance. Discontinued Repatha from medication list. Discussed importance of cholesterol lowering.  Hypertension (BP goal <130/80) -Controlled - per clinic readings within goal  -Comorbid HFrEF followed by cardiology -Current treatment: Carvedilol 12.5 mg - 1 tablet twice daily Spironolactone 25 mg - 1 tablet daily  Entresto 49-51 mg - 1 tablet twice daily -Medications previously tried: none  -Current home readings: she is checking BP twice daily, 124/76 this  morning; 141/80 last night -Current dietary habits: limits salt -Current exercise: exercises at night with home equipment; weights, dances with friend  -Denies hypotensive/hypertensive symptoms -Counseled to monitor BP at home at least once monthly, document, and provide log at future appointments -Recommended to continue current medication  Diabetes (A1c goal <8%) -Uncontrolled - A1c 11.8% (07/22) -Pt declines follow up with endocrinology at this time. She does not feel there is anything that can be done for her diabetes.  -Current medications: Levemir 100 units/mL - Inject 30 units twice daily  Regular insulin 500 units/mL - Inject 65 units three times daily before meals  -Medications previously tried: Per chart, patient has tried these meds in past: Lantus - not covered by insurance/pt reports Lantus is ineffective, Avandia - HF, glimepiride - hypoglycemia, metformin - unknown, Invokana - increased thirst, Bydureon - did well but cost concern -Pt reports labile sugars affected by stress, caffeine, etc  -Current home glucose readings - checks 8x/day - BG averaging around 200 per pt report Random glucose: Pt reports her average BG is 200. If she drops lower than that, she does not feel great. Reports meter average A1c 10.8% currently. Denies recent lows. She usually has symptoms when BG is in low 100s. -Denies hypoglycemic/hyperglycemic symptoms -She was not able to use [employer] - reports she was denied because she did not have a computer. She has a Stage manager. Per pt, a computer was necessary. -Cost: Humulin R - patient approved for assistance through 2023. Levemir affordable. -She is up to date on foot and eye exams. -She denies any trouble with anxiety/depression. She reports her sleep schedule is off. Often stays up late to wait for her BG to come down < 400. May sleep until noon. Pt also states she could increase her insulin but this results in 10 lb weight gain. -  Recommended to  continue current medication; She will bring home BG when she comes to see Dr. Lorelei Pont in January. May reconsider GLP-1 at this time if pt agrees.   Patient Goals/Self-Care Activities Patient will:  - take medications as prescribed - check glucose daily before meals, 2 hours after meals, and bedtime, document, and provide at future appointments - check blood pressure daily, document, and provide at future appointments  Follow Up Plan:  - CCM follow up 3 months  - Patient is not a candidate for CCM team BG log reviews      Patient verbalizes understanding of instructions provided today and agrees to view in [place].   [name], PharmD Clinical Pharmacist Practitioner [facility] at [facility] [phone]

## 2021-09-12 NOTE — Progress Notes (Signed)
Chronic Care Management Pharmacy Note  09/12/2021 Name:  Kristy [name] MRN:  [id number] DOB:  1951-06-01  Subjective: Kristy [name] is an 70 y.o. year old female who is a primary patient of Copland, [name], MD.  The CCM team was consulted for assistance with disease management and care coordination needs.    Engaged with patient by telephone for follow up visit in response to provider referral for pharmacy case management and/or care coordination services.   Consent to Services:  The patient was given information about Chronic Care Management services, agreed to services, and gave verbal consent prior to initiation of services.  Please see initial visit note for detailed documentation.   Patient Care Team: Owens Loffler, MD as PCP - General (Family Medicine) Rockey Situ Kathlene November, MD as PCP - Cardiology (Cardiology) Minna Merritts, MD as Consulting Physician (Cardiology) [name], [facility] as Pharmacist (Pharmacist)  Recent office visits: 04/13/21 - Dr. Owens Loffler, PCP - Pt presented for diabetes follow up. Continue current medications. Follow up 3 months.  Recent consult visits: 05/03/21 - Cardiology, Dr Rockey Situ - Pt presented for CHF. Start Repatha 140 mg every other week [place].  Hospital visits: None in previous 6 months  Objective:  Lab Results  Component Value Date   CREATININE 1.03 10/04/2020   BUN 22 10/04/2020   GFR 55.55 (L) 10/04/2020   GFRNONAA 70 04/08/2020   GFRAA 80 04/08/2020   NA 136 10/04/2020   K 3.8 10/04/2020   CALCIUM 11.2 (H) 10/04/2020   CO2 28 10/04/2020   GLUCOSE 279 (H) 10/04/2020    Lab Results  Component Value Date/Time   HGBA1C 11.8 (A) 04/13/2021 02:52 PM   HGBA1C 11.7 (A) 10/04/2020 02:43 PM   HGBA1C 9.7 (H) 12/08/2019 03:00 PM   HGBA1C 10.9 (H) 07/08/2019 03:07 PM   HGBA1C >14.0 12/02/2018 12:00 AM   GFR 55.55 (L) 10/04/2020 04:19 PM   GFR 46.85 (L) 12/08/2019 03:00 PM   MICROALBUR 18.1 (H) 12/10/2018 10:08 AM    MICROALBUR 36.0 (H) 01/10/2017 03:15 PM    Last diabetic Eye exam:  Lab Results  Component Value Date/Time   HMDIABEYEEXA Retinopathy (A) 04/11/2021 12:00 AM    Last diabetic Foot exam:  up to date  Lab Results  Component Value Date   CHOL 267 (H) 10/04/2020   HDL 36.90 (L) 10/04/2020   LDLCALC 103 (H) 04/22/2019   LDLDIRECT 177.0 10/04/2020   TRIG 339.0 (H) 10/04/2020   CHOLHDL 7 10/04/2020    Hepatic Function Latest Ref Rng & Units 10/04/2020 12/08/2019 08/21/2019  Total Protein 6.0 - 8.3 g/dL 7.3 6.8 8.3(H)  Albumin 3.5 - 5.2 g/dL 4.5 3.9 3.5  AST 0 - 37 U/L 22 17 19   ALT 0 - 35 U/L 19 13 26   Alk Phosphatase 39 - 117 U/L 81 103 245(H)  Total Bilirubin 0.2 - 1.2 mg/dL 0.5 0.4 1.3(H)  Bilirubin, Direct 0.0 - 0.3 mg/dL 0.1 0.1 -   Lab Results  Component Value Date/Time   TSH 1.325 08/07/2019 01:21 PM   TSH 0.586 04/19/2019 06:54 PM   TSH 3.88 07/11/2018 08:46 AM   TSH 1.13 01/10/2017 03:15 PM   FREET4 1.04 04/19/2019 06:54 PM   FREET4 0.98 09/07/2014 02:43 PM    CBC Latest Ref Rng & Units 10/04/2020 12/08/2019 08/29/2019  WBC 4.0 - 10.5 K/uL 9.6 10.9(H) 10.0  Hemoglobin 12.0 - 15.0 g/dL 14.3 13.0 11.1(L)  Hematocrit 36.0 - 46.0 % 43.3 40.4 33.4(L)  Platelets 150.0 - 400.0 K/uL  181.0 196.0 137(L)   Clinical ASCVD: Yes  The ASCVD Risk score (Arnett DK, et al., 2019) failed to calculate for the following reasons:   The patient has a prior MI or stroke diagnosis    Depression screen [facility] 2/9 07/31/2019 02/12/2019 07/27/2017  Decreased Interest 0 3 0  Down, Depressed, Hopeless 2 0 0  PHQ - 2 Score 2 3 0  Altered sleeping 1 0 -  Tired, decreased energy 1 3 -  Change in appetite 1 3 -  Feeling bad or failure about yourself  0 0 -  Trouble concentrating 0 0 -  Moving slowly or fidgety/restless 0 2 -  Suicidal thoughts 0 0 -  PHQ-9 Score 5 11 -  Difficult doing work/chores - Not difficult at all -  Some recent data might be hidden    Social History   Tobacco Use   Smoking Status Former   Packs/day: 0.75   Years: 30.00   Pack years: 22.50   Types: Cigarettes   Quit date: 06/06/1997   Years since quitting: 24.2  Smokeless Tobacco Never   BP Readings from Last 3 Encounters:  07/26/21 127/66  05/03/21 (!) 110/54  04/13/21 120/60   Pulse Readings from Last 3 Encounters:  07/26/21 80  05/03/21 80  04/13/21 89   Wt Readings from Last 3 Encounters:  07/26/21 166 lb (75.3 kg)  05/03/21 167 lb 8 oz (76 kg)  04/13/21 167 lb 8 oz (76 kg)   BMI Readings from Last 3 Encounters:  07/26/21 29.41 kg/m  05/03/21 29.67 kg/m  04/13/21 28.31 kg/m    Assessment/Interventions: Review of patient past medical history, allergies, medications, health status, including review of consultants reports, laboratory and other test data, was performed as part of comprehensive evaluation and provision of chronic care management services.   SDOH:  (Social Determinants of Health) assessments and interventions performed: Yes SDOH Interventions    Flowsheet Row Most Recent Value  SDOH Interventions   Financial Strain Interventions Other (Comment)  [Assistance applied/approved U-500 insulin]       SDOH Screenings   Alcohol Screen: Not on file  Depression (PHQ2-9): Not on file  Financial Resource Strain: Medium Risk   Difficulty of Paying Living Expenses: Somewhat hard  Food Insecurity: Not on file  Housing: Not on file  Physical Activity: Not on file  Social Connections: Not on file  Stress: Not on file  Tobacco Use: Medium Risk   Smoking Tobacco Use: Former   Smokeless Tobacco Use: Never   Passive Exposure: Not on file  Transportation Needs: Not on file    [name]  Allergies  Allergen Reactions   Fish Oil Other (See Comments)    Gout   Glimepiride Other (See Comments)    REACTION: hypoglycemia   Guanfacine Hcl Other (See Comments)    REACTION: unspecified   Other     Splenda caused sugars levels to increase   Rosiglitazone Other (See  Comments)    CHF   Shellfish Allergy     Gout    Medications Reviewed Today     Reviewed by [name], [facility] (Pharmacist) on 09/12/21 at 1451  Med List Status: <None>   Medication Order Taking? Sig Documenting Provider Last Dose Status Informant  aspirin EC 81 MG EC tablet [id number] Yes Take 1 tablet (81 mg total) by mouth daily. Hillary Bow, MD Taking Active Other  carvedilol (COREG) 12.5 MG tablet [id number] Yes TAKE 1 TABLET BY MOUTH TWICE DAILY Gollan, Kathlene November, MD  Taking Active   chlorhexidine (PERIDEX) 0.12 % solution [id number] Yes RINSE WITH 1 CAPFUL (15ML) FOR 30 SECONDS IN THE MORNING AND IN THE EVENING AFTER TOOTHBRUSHING DO NOT SWALLOW [provider] Taking Active   Ensure [facility]) [id number] Yes Take 237 mLs by mouth daily. [provider] Taking Active Other  ENTRESTO 49-51 MG [id number] Yes TAKE 1 TABLET BY MOUTH TWICE DAILY Gollan, Kathlene November, MD Taking Active   glucose blood (ONETOUCH ULTRA) test strip [id number] Yes USE TO CHECK BLOOD SUGAR UP TO 8 TIMES DAILY Bedsole, Amy E, MD Taking Active   HUMULIN R U-500 KWIKPEN 500 UNIT/ML KwikPen [id number] Yes INJECT 65 UNITS INTO THE SKIN 3 TIMES DAILY WITH MEALS. Copland, [name], MD Taking Active   insulin regular human CONCENTRATED (HUMULIN R) 500 UNIT/ML injection [id number] Yes Inject 65 Units into the skin 3 (three) times daily with meals. [provider] Taking Active   Insulin Syringe-Needle U-100 (BD INSULIN SYRINGE U/F) 31G X 5/16" 0.5 ML MISC [id number] Yes Use to inject insulin daily Copland, Spencer, MD Taking Active   ivabradine (CORLANOR) 5 MG TABS tablet [id number] Yes Take 5 mg by mouth daily as needed. [provider] Taking Active   LEVEMIR 100 UNIT/ML injection [id number] Yes INJECT 30 UNITS TOTAL INTO THE SKIN TWICE DAILY AS DIRECTED. Owens Loffler, MD Taking Active   Probiotic Product (ALIGN PO) [id number] Yes Take 1 tablet by mouth daily. [provider] Taking  Active   spironolactone (ALDACTONE) 25 MG tablet [id number] Yes Take 1 tablet (25 mg total) by mouth daily. Minna Merritts, MD Taking Active             Patient Active Problem List   Diagnosis Date Noted   Advanced care planning/counseling discussion: DNR / DNI 10/04/2020   Patient has active power of attorney for health care: [name] 10/04/2020   CKD stage 2 due to type 2 diabetes mellitus ([place]) 10/04/2020   NICM (nonischemic cardiomyopathy) ([place]) 80/99/8338   Chronic systolic CHF (congestive heart failure) ([place]) 08/22/2018   Coronary artery disease, non-occlusive 09/13/2016   Crohn's disease ([place]) 10/13/2013   Major depressive disorder, recurrent, in full remission ([place]) 05/01/2011   PSORIASIS 08/02/2009   Hyperlipidemia 05/11/2009   GOUT 02/05/2009   Uncontrolled type 2 diabetes mellitus with stage 3 chronic kidney disease, with long-term current use of insulin    Essential hypertension 04/18/2007   LBBB (left bundle branch block) 04/18/2007   ALLERGIC RHINITIS 04/18/2007    Immunization History  Administered Date(s) Administered   PPD Test 08/29/2019   Td 10/02/1993, 01/06/2008    Conditions to be addressed/monitored:  Hypertension, Hyperlipidemia, and Diabetes   Care Plan : General Pharmacy (Adult)  Updates made by [name], [facility] since 09/12/2021 12:00 AM     Problem: Disease Management   Priority: High  Note:    Current Barriers:  Unable to achieve control of diabetes and cholesterol  Pharmacist Clinical Goal(s):  Patient will contact provider office for questions/concerns as evidenced notation of same in electronic health record through collaboration with PharmD and provider.   Interventions: 1:1 collaboration with Owens Loffler, MD regarding development and update of comprehensive plan of care as evidenced by provider attestation and co-signature Inter-disciplinary care team collaboration (see longitudinal plan of care) Comprehensive  medication review performed; medication list updated in electronic medical record  Hyperlipidemia/CAD: (LDL goal < 70) -Uncontrolled, LDL 177 (10/2020), recent interventions include cardiologist started [place]. Pt no longer taking. She did not tolerate. -Current treatment: No  pharmacotherapy -Medications previously tried: statins - multiple intolerances, Zetia, Repatha - felt awful for 14 days  -Educated on Cholesterol goals, risks/benefits of Repatha discussed -Counseled on diet and exercise extensively; Pt states she will not take cholesterol medications given history of poor tolerance. Discontinued Repatha from medication list. Discussed importance of cholesterol lowering.  Hypertension (BP goal <130/80) -Controlled - per clinic readings within goal  -Comorbid HFrEF followed by cardiology -Current treatment: Carvedilol 12.5 mg - 1 tablet twice daily Spironolactone 25 mg - 1 tablet daily  Entresto 49-51 mg - 1 tablet twice daily -Medications previously tried: none  -Current home readings: she is checking BP twice daily, 124/76 this morning; 141/80 last night -Current dietary habits: limits salt -Current exercise: exercises at night with home equipment; weights, dances with friend  -Denies hypotensive/hypertensive symptoms -Counseled to monitor BP at home at least once monthly, document, and provide log at future appointments -Recommended to continue current medication  Diabetes (A1c goal <8%) -Uncontrolled - A1c 11.8% (07/22) -Pt declines follow up with endocrinology at this time. She does not feel there is anything that can be done for her diabetes.  -Current medications: Levemir 100 units/mL - Inject 30 units twice daily  Regular insulin 500 units/mL - Inject 65 units three times daily before meals  -Medications previously tried: Per chart, patient has tried these meds in past: Lantus - not covered by insurance/pt reports Lantus is ineffective, Avandia - HF, glimepiride -  hypoglycemia, metformin - unknown, Invokana - increased thirst, Bydureon - did well but cost concern -Pt reports labile sugars affected by stress, caffeine, etc  -Current home glucose readings - checks 8x/day - BG averaging around 200 per pt report Random glucose: Pt reports her average BG is 200. If she drops lower than that, she does not feel great. Reports meter average A1c 10.8% currently. Denies recent lows. She usually has symptoms when BG is in low 100s. -Denies hypoglycemic/hyperglycemic symptoms -She was not able to use [employer] - reports she was denied because she did not have a computer. She has a Stage manager. Per pt, a computer was necessary. -Cost: Humulin R - patient approved for assistance through 2023. Levemir affordable. -She is up to date on foot and eye exams. -She denies any trouble with anxiety/depression. She reports her sleep schedule is off. Often stays up late to wait for her BG to come down < 400. May sleep until noon. Pt also states she could increase her insulin but this results in 10 lb weight gain. -Recommended to continue current medication; She will bring home BG when she comes to see Dr. Lorelei Pont in January. May reconsider GLP-1 at this time if pt agrees.   Patient Goals/Self-Care Activities Patient will:  - take medications as prescribed - check glucose daily before meals, 2 hours after meals, and bedtime, document, and provide at future appointments - check blood pressure daily, document, and provide at future appointments  Follow Up Plan:  - CCM follow up 3 months  - Patient is not a candidate for CCM team BG log reviews      Medication Assistance: None required.  Patient affirms current coverage meets needs.  Patient's preferred pharmacy is: [facility] - [place], [place] - [address] [address] Phone: [phone] Fax: [phone]  [facility] [place], [place] - [address] [place] [address] Phone: [phone] Fax: [phone]  Primary pharmacy  is [facility]. She confirms adherence to her medications. Her refills are timely.  Care Plan and Follow Up Patient Decision:  Patient agrees to Care Plan and Follow-up.  [name], PharmD Clinical Pharmacist [facility] at [facility] [phone]

## 2021-09-15 ENCOUNTER — Other Ambulatory Visit: Payer: Self-pay | Admitting: Family Medicine

## 2021-09-15 NOTE — Telephone Encounter (Signed)
Sent michelle a separate message through staff message as system not letting me select her as a recipient

## 2021-09-15 NOTE — Telephone Encounter (Signed)
[name], getting refill on this medication request but I see patient was approved for assistance. Do I refill this for [facility] or you have to do something different for this?

## 2021-09-16 NOTE — Telephone Encounter (Signed)
Per [name], patient may not have received her first shipment of assistance medication so to go ahead and refill this request

## 2021-09-22 ENCOUNTER — Telehealth: Payer: Self-pay | Admitting: Family Medicine

## 2021-09-22 NOTE — Telephone Encounter (Signed)
Pt is calling in concerning her insulin pens. She says [employer] sent her 100units, she needs 500units a shot. She is wondering what is the ratio of 100units does she need for the 500un its. Please advise pt at [phone].

## 2021-09-28 NOTE — Telephone Encounter (Signed)
Spoke with representative, they confirmed prescription was for U-500 insulin. They suggested I call the pharmacy to verify pt received the correct product. Pharmacy reports they delivered patient Humulin-R U-500 solution, 40 mL 3 month supply. Directions: Dial 65 units TID meals.   Gean Birchwood, Specialty Pharmacy number: [phone]

## 2021-09-28 NOTE — Telephone Encounter (Signed)
I was out of the office through 12/21-12/28. I will contact Ashley now to verify prescription.

## 2021-09-30 ENCOUNTER — Other Ambulatory Visit: Payer: Self-pay | Admitting: Family Medicine

## 2021-09-30 NOTE — Telephone Encounter (Signed)
Spoke with patient, she received the vials instead of the kwikpen for the U-500 insulin from [employer]. Pharmacy did not have U-500 syringes, so they provided her with U-100 syringes. Her local pharmacist calculated dose to use the U-100 syringes with the U-500 insulin [dose is 13 units TID (65 units divided by 5)]. This is accurate since the U-500 is 5x concentrated. Patient confirms understanding and correct dose at this time. I do not want to add confusion by sending in the U-500 syringes. Instead I have updated her prescription to the [facility], next delivery from [facility] will be the pens.  [name], PharmD Clinical Pharmacist Practitioner [facility] at [facility] [phone]

## 2021-10-05 ENCOUNTER — Telehealth: Payer: Self-pay | Admitting: *Deleted

## 2021-10-05 NOTE — Telephone Encounter (Signed)
Received fax from [facility] requesting PA for OneTouch Ultra Strips.  PA completed on CoverMyMeds and approved from 10/05/2021 to 10/05/2022.  [facility] notified of approval via fax.

## 2021-10-07 DIAGNOSIS — E113493 Type 2 diabetes mellitus with severe nonproliferative diabetic retinopathy without macular edema, bilateral: Secondary | ICD-10-CM | POA: Diagnosis not present

## 2021-10-11 ENCOUNTER — Encounter: Payer: Self-pay | Admitting: Pharmacist

## 2021-10-11 NOTE — Progress Notes (Signed)
[place] [facility])                                            [place] Team                                        Statin Quality Measure Assessment    10/11/2021  Kristy [name] [birth year] [id number]  Patient carries a diagnosis of diabetes and cardiovascular disease. Documented history of statin intolerance in chart.       Component Value Date/Time   CHOL 267 (H) 10/04/2020 1619   TRIG 339.0 (H) 10/04/2020 1619   HDL 36.90 (L) 10/04/2020 1619   CHOLHDL 7 10/04/2020 1619   VLDL 67.8 (H) 10/04/2020 1619   LDLCALC 103 (H) 04/22/2019 0700   LDLDIRECT 177.0 10/04/2020 1619    Please consider ONE of the following recommendations:  Initiate high intensity statin Atorvastatin 34m once daily, #90, 3 refills   Rosuvastatin 224monce daily, #90, 3 refills    Initiate moderate intensity  statin with reduced frequency if prior  statin intolerance 1x weekly, #13, 3 refills   2x weekly, #26, 3 refills   3x weekly, #39, 3 refills    Code for past statin intolerance  (required annually)  Provider Requirements: Must associate code during an office visit or telehealth encounter   Drug Induced Myopathy G72.0   Myositis, unspecified M60.9   Myopathy, unspecified G72.9   Rhabdomyolysis  M62.82    Will notify Dr. CoEdilia Borior to upcoming appointment.   TiLoretha BrasilPharmD CoHigh Ridgeharmacist Office: [phone]

## 2021-10-19 ENCOUNTER — Encounter: Payer: Medicare HMO | Admitting: Family Medicine

## 2021-10-20 ENCOUNTER — Other Ambulatory Visit: Payer: Self-pay | Admitting: Family Medicine

## 2021-10-31 ENCOUNTER — Other Ambulatory Visit: Payer: Self-pay

## 2021-10-31 ENCOUNTER — Ambulatory Visit: Payer: Medicare HMO | Admitting: Dermatology

## 2021-10-31 ENCOUNTER — Encounter: Payer: Self-pay | Admitting: Dermatology

## 2021-10-31 DIAGNOSIS — L820 Inflamed seborrheic keratosis: Secondary | ICD-10-CM | POA: Diagnosis not present

## 2021-10-31 DIAGNOSIS — L821 Other seborrheic keratosis: Secondary | ICD-10-CM

## 2021-10-31 DIAGNOSIS — L853 Xerosis cutis: Secondary | ICD-10-CM | POA: Diagnosis not present

## 2021-10-31 DIAGNOSIS — Z85828 Personal history of other malignant neoplasm of skin: Secondary | ICD-10-CM | POA: Diagnosis not present

## 2021-10-31 DIAGNOSIS — L578 Other skin changes due to chronic exposure to nonionizing radiation: Secondary | ICD-10-CM

## 2021-10-31 NOTE — Progress Notes (Signed)
° °  New Patient Visit  Subjective  Kristy [name] is a 71 y.o. female who presents for the following: Skin Problem (The patient has spots, moles and lesions to be evaluated, some may be new or changing and the patient has concerns that these could be cancer. ). + Diabetic  + Heart failure   The following portions of the chart were reviewed this encounter and updated as appropriate:   Tobacco   Allergies   Meds   Problems   Med Hx   Surg Hx   Fam Hx      Review of Systems:  No other skin or systemic complaints except as noted in HPI or Assessment and Plan.  Objective  Well appearing patient in no apparent distress; mood and affect are within normal limits.  A focused examination was performed including face, hands. Relevant physical exam findings are noted in the Assessment and Plan.  right forehead x 2, right lateral brow x 1, left medial cheek x 1, left lower eyelid x 1 (5) Stuck-on, waxy, tan-brown papules     Assessment & Plan  Inflamed seborrheic keratosis (5) right forehead x 2, right lateral brow x 1, left medial cheek x 1, left lower eyelid margin x 1  Reassured benign age-related growth.  Recommend observation.  Discussed cryotherapy if spot(s) become irritated or inflamed.   Prior to procedure, discussed risks of blister formation, small wound, skin dyspigmentation, or rare scar following cryotherapy. Recommend Vaseline ointment to treated areas while healing.   Destruction of lesion - right forehead x 2, right lateral brow x 1, left medial cheek x 1, left lower eyelid x 1 Complexity: simple   Destruction method: cryotherapy   Informed consent: discussed and consent obtained   Timeout:  patient name, date of birth, surgical site, and procedure verified Lesion destroyed using liquid nitrogen: Yes   Region frozen until ice ball extended beyond lesion: Yes   Outcome: patient tolerated procedure well with no complications   Post-procedure details: wound care instructions  given    Xerosis - diffuse xerotic patches - recommend gentle, hydrating skin care - gentle skin care handout given   Actinic Damage - chronic, secondary to cumulative UV radiation exposure/sun exposure over time - diffuse scaly erythematous macules with underlying dyspigmentation - Recommend daily broad spectrum sunscreen SPF 30+ to sun-exposed areas, reapply every 2 hours as needed.  - Recommend staying in the shade or wearing long sleeves, sun glasses (UVA+UVB protection) and wide brim hats (4-inch brim around the entire circumference of the hat). - Call for new or changing lesions.   Seborrheic Keratoses-pedunculated -neck  - Stuck-on, waxy, tan-brown papules and/or plaques  - Benign-appearing - Discussed benign etiology and prognosis. - Observe - Call for any changes  History of Basal Cell Carcinoma of the Skin Nose- removed at [facility]  - No evidence of recurrence today - Recommend regular full body skin exams - Recommend daily broad spectrum sunscreen SPF 30+ to sun-exposed areas, reapply every 2 hours as needed.  - Call if any new or changing lesions are noted between office visits    Return in about 3 months (around 01/29/2022) for ISK .  I, Marye Round, CMA, am acting as scribe for Sarina Ser, MD .  Documentation: I have reviewed the above documentation for accuracy and completeness, and I agree with the above.  Sarina Ser, MD

## 2021-10-31 NOTE — Patient Instructions (Addendum)
Gentle Skin Care Guide  1. Bathe no more than once a day.  2. Avoid bathing in hot water  3. Use a mild soap like Dove, Vanicream, Cetaphil, CeraVe. Can use Lever 2000 or Cetaphil antibacterial soap  4. Use soap only where you need it. On most days, use it under your arms, between your legs, and on your feet. Let the water rinse other areas unless visibly dirty.  5. When you get out of the bath/shower, use a towel to gently blot your skin dry, don't rub it.  6. While your skin is still a little damp, apply a moisturizing cream such as CeraVe cream . For hands apply Neutrogena [place] Hand Cream or Excipial Hand Cream.  7. Reapply moisturizer any time you start to itch or feel dry.  8. Sometimes using free and clear laundry detergents can be helpful. Fabric softener sheets should be avoided. Downy Free & Gentle liquid, or any liquid fabric softener that is free of dyes and perfumes, it acceptable to use  9. If your doctor has given you prescription creams you may apply moisturizers over them      Cryotherapy Aftercare  Wash gently with soap and water everyday.   Apply Vaseline and Band-Aid daily until healed.     If You Need Anything After Your Visit  If you have any questions or concerns for your doctor, please call our main line at [phone] and press option 4 to reach your doctor's medical assistant. If no one answers, please leave a voicemail as directed and we will return your call as soon as possible. Messages left after 4 pm will be answered the following business day.   You may also send [place] a message via [place]. We typically respond to MyChart messages within 1-2 business days.  For prescription refills, please ask your pharmacy to contact our office. Our fax number is [phone].  If you have an urgent issue when the clinic is closed that cannot wait until the next business day, you can page your doctor at the number below.    Please note that while we do our  best to be available for urgent issues outside of office hours, we are not available 24/7.   If you have an urgent issue and are unable to reach [place], you may choose to seek medical care at your doctor's office, retail clinic, urgent care center, or emergency room.  If you have a medical emergency, please immediately call 911 or go to the emergency department.  Pager Numbers  - Dr. Nehemiah Massed: [phone]  - Dr. Laurence Ferrari: [phone]  - Dr. Nicole Kindred: [phone]  In the event of inclement weather, please call our main line at [phone] for an update on the status of any delays or closures.  Dermatology Medication Tips: Please keep the boxes that topical medications come in in order to help keep track of the instructions about where and how to use these. Pharmacies typically print the medication instructions only on the boxes and not directly on the medication tubes.   If your medication is too expensive, please contact our office at [phone] option 4 or send [place] a message through [place].   We are unable to tell what your co-pay for medications will be in advance as this is different depending on your insurance coverage. However, we may be able to find a substitute medication at lower cost or fill out paperwork to get insurance to cover a needed medication.   If a prior authorization is required  to get your medication covered by your insurance company, please allow [place] 1-2 business days to complete this process.  Drug prices often vary depending on where the prescription is filled and some pharmacies may offer cheaper prices.  The website www.goodrx.com contains coupons for medications through different pharmacies. The prices here do not account for what the cost may be with help from insurance (it may be cheaper with your insurance), but the website can give you the price if you did not use any insurance.  - You can print the associated coupon and take it with your prescription to the  pharmacy.  - You may also stop by our office during regular business hours and pick up a GoodRx coupon card.  - If you need your prescription sent electronically to a different pharmacy, notify our office through [facility] or by phone at [phone] option 4.     Si Usted Necesita Algo Despus de Su Visita  Tambin puede enviarnos un mensaje a travs de Pharmacist, community. Por lo general respondemos a los mensajes de MyChart en el transcurso de 1 a 2 das hbiles.  Para renovar recetas, por favor pida a su farmacia que se ponga en contacto con nuestra oficina. Harland Dingwall de fax es [place] [phone].  Si tiene un asunto urgente cuando la clnica est cerrada y que no puede esperar hasta el siguiente da hbil, puede llamar/localizar a su doctor(a) al nmero que aparece a continuacin.   Por favor, tenga en cuenta que aunque hacemos todo lo posible para estar disponibles para asuntos urgentes fuera del horario de [place], no estamos disponibles las 24 horas del da, los 7 das de la [place].   Si tiene un problema urgente y no puede comunicarse con nosotros, puede optar por buscar atencin mdica  en el consultorio de su doctor(a), en una clnica privada, en un centro de atencin urgente o en una sala de emergencias.  Si tiene Engineering geologist, por favor llame inmediatamente al 911 o vaya a la sala de emergencias.  Nmeros de bper  - Dr. Nehemiah Massed: [phone]  - Dra. Moye: [phone]  - Dra. Nicole Kindred: [phone]  En caso de inclemencias del [place], por favor llame a Johnsie Kindred principal al [phone] para una actualizacin sobre el [place] de cualquier retraso o cierre.  Consejos para la medicacin en dermatologa: Por favor, guarde las cajas en las que vienen los medicamentos de uso tpico para ayudarle a seguir las instrucciones sobre dnde y cmo usarlos. Las farmacias generalmente imprimen las instrucciones del medicamento slo en las cajas y no directamente en los  tubos del [place].   Si su medicamento es muy caro, por favor, pngase en contacto con Zigmund Daniel llamando al [phone] y presione la opcin 4 o envenos un mensaje a travs de Pharmacist, community.   No podemos decirle cul ser su copago por los medicamentos por adelantado ya que esto es diferente dependiendo de la cobertura de su seguro. Sin embargo, es posible que podamos encontrar un medicamento sustituto a Electrical engineer un formulario para que el seguro cubra el medicamento que se considera necesario.   Si se requiere una autorizacin previa para que su compaa de seguros [place] su medicamento, por favor permtanos de 1 a 2 das hbiles para completar este proceso.  Los precios de los medicamentos varan con frecuencia dependiendo del Environmental consultant de dnde se surte la receta y alguna farmacias pueden ofrecer precios ms baratos.  El sitio web www.goodrx.com tiene cupones para medicamentos de [employer]  farmacias. Los precios aqu no tienen en cuenta lo que podra costar con la ayuda del seguro (puede ser ms barato con su seguro), pero el sitio web puede darle el precio si no utiliz Research scientist (physical sciences).  - Puede imprimir el cupn correspondiente y llevarlo con su receta a la farmacia.  - Tambin puede pasar por nuestra oficina durante el horario de atencin regular y Charity fundraiser una tarjeta de cupones de GoodRx.  - Si necesita que su receta se enve electrnicamente a una farmacia diferente, informe a nuestra oficina a travs de MyChart de [place] o por telfono llamando al [phone] y presione la opcin 4.

## 2021-11-01 ENCOUNTER — Encounter: Payer: Self-pay | Admitting: Dermatology

## 2021-11-01 ENCOUNTER — Other Ambulatory Visit: Payer: Self-pay | Admitting: Internal Medicine

## 2021-11-04 DIAGNOSIS — E113493 Type 2 diabetes mellitus with severe nonproliferative diabetic retinopathy without macular edema, bilateral: Secondary | ICD-10-CM | POA: Diagnosis not present

## 2021-11-28 ENCOUNTER — Other Ambulatory Visit: Payer: Self-pay | Admitting: Family Medicine

## 2021-12-05 ENCOUNTER — Encounter: Payer: Self-pay | Admitting: Family Medicine

## 2021-12-05 ENCOUNTER — Other Ambulatory Visit: Payer: Self-pay

## 2021-12-05 ENCOUNTER — Ambulatory Visit (INDEPENDENT_AMBULATORY_CARE_PROVIDER_SITE_OTHER): Payer: Medicare HMO | Admitting: Family Medicine

## 2021-12-05 VITALS — BP 130/64 | HR 89 | Temp 98.0°F | Ht 63.5 in | Wt 174.2 lb

## 2021-12-05 DIAGNOSIS — E083413 Diabetes mellitus due to underlying condition with severe nonproliferative diabetic retinopathy with macular edema, bilateral: Secondary | ICD-10-CM

## 2021-12-05 DIAGNOSIS — Z79899 Other long term (current) drug therapy: Secondary | ICD-10-CM

## 2021-12-05 DIAGNOSIS — I2510 Atherosclerotic heart disease of native coronary artery without angina pectoris: Secondary | ICD-10-CM | POA: Diagnosis not present

## 2021-12-05 DIAGNOSIS — E785 Hyperlipidemia, unspecified: Secondary | ICD-10-CM

## 2021-12-05 DIAGNOSIS — I5022 Chronic systolic (congestive) heart failure: Secondary | ICD-10-CM

## 2021-12-05 DIAGNOSIS — Z0000 Encounter for general adult medical examination without abnormal findings: Secondary | ICD-10-CM | POA: Diagnosis not present

## 2021-12-05 DIAGNOSIS — E782 Mixed hyperlipidemia: Secondary | ICD-10-CM | POA: Diagnosis not present

## 2021-12-05 DIAGNOSIS — E1169 Type 2 diabetes mellitus with other specified complication: Secondary | ICD-10-CM | POA: Diagnosis not present

## 2021-12-05 DIAGNOSIS — Z794 Long term (current) use of insulin: Secondary | ICD-10-CM | POA: Diagnosis not present

## 2021-12-05 DIAGNOSIS — Z789 Other specified health status: Secondary | ICD-10-CM | POA: Diagnosis not present

## 2021-12-05 DIAGNOSIS — I10 Essential (primary) hypertension: Secondary | ICD-10-CM

## 2021-12-05 DIAGNOSIS — IMO0002 Reserved for concepts with insufficient information to code with codable children: Secondary | ICD-10-CM

## 2021-12-05 LAB — CBC WITH DIFFERENTIAL/PLATELET
Basophils Absolute: 0 10*3/uL (ref 0.0–0.1)
Basophils Relative: 0.5 % (ref 0.0–3.0)
Eosinophils Absolute: 0.2 10*3/uL (ref 0.0–0.7)
Eosinophils Relative: 1.8 % (ref 0.0–5.0)
HCT: 41.6 % (ref 36.0–46.0)
Hemoglobin: 13.6 g/dL (ref 12.0–15.0)
Lymphocytes Relative: 31.1 % (ref 12.0–46.0)
Lymphs Abs: 3 10*3/uL (ref 0.7–4.0)
MCHC: 32.8 g/dL (ref 30.0–36.0)
MCV: 90.4 fl (ref 78.0–100.0)
Monocytes Absolute: 0.6 10*3/uL (ref 0.1–1.0)
Monocytes Relative: 6 % (ref 3.0–12.0)
Neutro Abs: 5.8 10*3/uL (ref 1.4–7.7)
Neutrophils Relative %: 60.6 % (ref 43.0–77.0)
Platelets: 167 10*3/uL (ref 150.0–400.0)
RBC: 4.6 Mil/uL (ref 3.87–5.11)
RDW: 15.2 % (ref 11.5–15.5)
WBC: 9.5 10*3/uL (ref 4.0–10.5)

## 2021-12-05 LAB — HEPATIC FUNCTION PANEL
ALT: 17 U/L (ref 0–35)
AST: 21 U/L (ref 0–37)
Albumin: 4.2 g/dL (ref 3.5–5.2)
Alkaline Phosphatase: 69 U/L (ref 39–117)
Bilirubin, Direct: 0.1 mg/dL (ref 0.0–0.3)
Total Bilirubin: 0.6 mg/dL (ref 0.2–1.2)
Total Protein: 7.1 g/dL (ref 6.0–8.3)

## 2021-12-05 LAB — BASIC METABOLIC PANEL
BUN: 19 mg/dL (ref 6–23)
CO2: 28 mEq/L (ref 19–32)
Calcium: 10.5 mg/dL (ref 8.4–10.5)
Chloride: 103 mEq/L (ref 96–112)
Creatinine, Ser: 1.07 mg/dL (ref 0.40–1.20)
GFR: 52.63 mL/min — ABNORMAL LOW (ref >60.00)
Glucose, Bld: 169 mg/dL — ABNORMAL HIGH (ref 70–99)
Potassium: 4.3 mEq/L (ref 3.5–5.1)
Sodium: 141 mEq/L (ref 135–145)

## 2021-12-05 LAB — LIPID PANEL
Cholesterol: 261 mg/dL — ABNORMAL HIGH (ref 0–200)
HDL: 34.3 mg/dL — ABNORMAL LOW (ref >39.00)
NonHDL: 226.48
Total CHOL/HDL Ratio: 8
Triglycerides: 205 mg/dL — ABNORMAL HIGH (ref 0.0–149.0)
VLDL: 41 mg/dL — ABNORMAL HIGH (ref 0.0–40.0)

## 2021-12-05 LAB — HEMOGLOBIN A1C: Hgb A1c MFr Bld: 9.4 % — ABNORMAL HIGH (ref 4.6–6.5)

## 2021-12-05 LAB — LDL CHOLESTEROL, DIRECT: Direct LDL: 187 mg/dL

## 2021-12-05 MED ORDER — EZETIMIBE 10 MG PO TABS: 10.0000 mg | ORAL_TABLET | Freq: Every day | ORAL | 3 refills | Status: DC

## 2021-12-05 NOTE — Progress Notes (Signed)
[name] T. Eastin Swing, MD, [place] [facility] [place], [address]  Phone: [phone]   FAX: [place] - 71 y.o. female   MRN [id number]   Date of Birth: [birth year]  Date: 12/05/2021   PCP: Owens Loffler, MD   Referral: Owens Loffler, MD  Chief Complaint  Patient presents with   Medicare Wellness    This visit occurred during the SARS-CoV-2 public health emergency.  Safety protocols were in place, including screening questions prior to the visit, additional usage of staff PPE, and extensive cleaning of exam room while observing appropriate contact time as indicated for disinfecting solutions.   Patient Care Team: Owens Loffler, MD as PCP - General (Family Medicine) Minna Merritts, MD as PCP - Cardiology (Cardiology) Minna Merritts, MD as Consulting Physician (Cardiology) [name], [facility] as Pharmacist (Pharmacist) Subjective:   Kristy [name] is a 71 y.o. pleasant patient who presents for a medicare wellness examination:  Health Maintenance Summary Reviewed and updated, unless pt declines services.  Tobacco History Reviewed. Non-smoker Alcohol: No concerns, no excessive use Exercise Habits: Some activity, rec at least 30 mins 5 times a week STD concerns: none Drug Use: None Birth control method: n/a Menses regular: n/a Lumps or breast concerns: no Breast Cancer Family History: no  Covid - concerned about it. Shingles - no Tdap -  Declines all.   Brought all of her BS  Now using a 100 units - auto-loader Insulin cost down to 60 dollars Her blood sugars have been much better now instead of using the auto later using a traditional syringe and needle, which she has used for years in the past.  Levemir was at 30, now down to 25 units.   Could not tolerate the injectibale chol medication.  Will not take it.  She also will not take a statin, and she has had many  complications over the years and pain.  She does have CAD. 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 Exercise: minimal / intermittent Avg blood sugars at home: not checking Foot problems: none Hypoglycemia: none No nausea, vomitting, blurred vision, polyuria.  Lab Results  Component Value Date   HGBA1C 9.4 (H) 12/05/2021   HGBA1C 11.8 (A) 04/13/2021   HGBA1C 11.7 (A) 10/04/2020   Lab Results  Component Value Date   MICROALBUR 18.1 (H) 12/10/2018   LDLCALC 103 (H) 04/22/2019   CREATININE 1.07 12/05/2021    Wt Readings from Last 3 Encounters:  12/05/21 174 lb 4 oz (79 kg)  07/26/21 166 lb (75.3 kg)  05/03/21 167 lb 8 oz (76 kg)    Lipids: Doing well, stable. Tolerating meds fine with no SE. Panel reviewed with patient.  Lipids: Lab Results  Component Value Date   CHOL 261 (H) 12/05/2021   Lab Results  Component Value Date   HDL 34.30 (L) 12/05/2021   Lab Results  Component Value Date   LDLCALC 103 (H) 04/22/2019   Lab Results  Component Value Date   TRIG 205.0 (H) 12/05/2021   Lab Results  Component Value Date   CHOLHDL 8 12/05/2021    Lab Results  Component Value Date   ALT 17 12/05/2021   AST 21 12/05/2021   ALKPHOS 69 12/05/2021   BILITOT 0.6 12/05/2021    HTN: Tolerating all medications without side  effects Stable and at goal No CP, no sob. No HA.  BP Readings from Last 3 Encounters:  12/05/21 130/64  07/26/21 127/66  05/03/21 (!) 258/52    Basic Metabolic Panel:    Component Value Date/Time   NA 141 12/05/2021 1035   NA 136 04/08/2020 1447   K 4.3 12/05/2021 1035   CL 103 12/05/2021 1035   CO2 28 12/05/2021 1035   BUN 19 12/05/2021 1035   BUN 16 04/08/2020 1447   CREATININE 1.07 12/05/2021 1035   CREATININE 0.72 07/27/2014 0902   GLUCOSE 169 (H) 12/05/2021 1035   CALCIUM 10.5 12/05/2021 1035     Health  Maintenance  Topic Date Due   COVID-19 Vaccine (1) Never done   Zoster Vaccines- Shingrix (1 of 2) Never done   DEXA SCAN  Never done   TETANUS/TDAP  01/05/2018   INFLUENZA VACCINE  12/30/2021 (Originally 05/02/2021)   Pneumonia Vaccine 22+ Years old (1 - PCV) 12/06/2022 (Originally 06/06/1957)   OPHTHALMOLOGY EXAM  04/11/2022   FOOT EXAM  04/15/2022   HEMOGLOBIN A1C  06/07/2022   MAMMOGRAM  02/23/2023   COLONOSCOPY (Pts 45-22yr Insurance coverage will need to be confirmed)  10/13/2027   Hepatitis C Screening  Completed   HPV VACCINES  Aged Out   Immunization History  Administered Date(s) Administered   PPD Test 08/29/2019   Td 10/02/1993, 01/06/2008    Patient Active Problem List   Diagnosis Date Noted   Advanced care planning/counseling discussion: DNR / DNI 10/04/2020    Priority: High   Patient has active power of attorney for health care: Todd Peeler 10/04/2020    Priority: High   NICM (nonischemic cardiomyopathy) ([facility] 11/08/2018    Priority: High   Chronic systolic CHF (congestive heart failure) ([facility] 08/22/2018    Priority: High   Coronary artery disease, non-occlusive 09/13/2016    Priority: High   Uncontrolled type 2 diabetes mellitus with stage 3 chronic kidney disease, with long-term current use of insulin     Priority: High   Crohn's disease ([facility] 10/13/2013    Priority: Medium    Major depressive disorder, recurrent, in full remission ([facility] 05/01/2011    Priority: Medium    Hyperlipidemia 05/11/2009    Priority: Medium    Statin intolerance 12/06/2021   CKD stage 2 due to type 2 diabetes mellitus ([facility] 10/04/2020   PSORIASIS 08/02/2009   GOUT 02/05/2009   Essential hypertension 04/18/2007   LBBB (left bundle branch block) 04/18/2007   ALLERGIC RHINITIS 04/18/2007    Past Medical History:  Diagnosis Date   Allergic rhinitis    Basal cell carcinoma    nose- removed at [facility]   Cataract    mild    Crohn's disease ([facility] 10/13/2013   Diverticulosis     GERD (gastroesophageal reflux disease)    Gout    HFrEF (heart failure with reduced ejection fraction) ([facility]    a. 12/2008 Cath: EF 45% w/ inf HK; b. 07/2009 Echo: EF 50-55%; c. 08/2018 Echo: EF 20-25%.   Hyperlipidemia    Hypertension    IBS (irritable bowel syndrome)    Left bundle branch block    Neuromuscular disorder (HCC)    neuropathy   NICM (nonischemic cardiomyopathy) ([facility]    a. 12/2008 Cath: no significant dzs, EF 45% w/ inf HK->Med Rx; b. 07/2009 Echo: EF 50-55%; c. 08/2018 Echo: EF 20-25%, ant/antsept HK, mild MR, mildly dil LA, nl RV fx; d. 08/2018 Cath: D1 80, otw nonobs dzs->Med rx.  Non-obstructive CAD (coronary artery disease)    a. 12/2008 Cath: no significant dzs, EF 45% w/ inf HK->Med Rx; b. 08/2018 Cath: LM nl, LAD min irregs, D1 80, LCX 41md, RCA min irregs->Med Rx.   Osteoarthritis    Patient has active power of attorney for health care: TOretha Caprice01/11/2020   Symptomatic cholelithiasis    Uncontrolled type 2 diabetes mellitus with stage 3 chronic kidney disease, with long-term current use of insulin          Past Surgical History:  Procedure Laterality Date   BREAST BIOPSY Right 06/24/2019   stereo bx/ x clip/ neg   BREAST CYST ASPIRATION     CARDIAC CATHETERIZATION     CATARACT EXTRACTION W/PHACO Right 07/26/2021   Procedure: CATARACT EXTRACTION PHACO AND INTRAOCULAR LENS PLACEMENT ([place] RIGHT DIABETIC;  Surgeon: PBirder Robson MD;  [facility]: [facility]  Service: Ophthalmology;  Laterality: Right;  Diabetic 8.99 01:10.2   COLONOSCOPY     DILATION AND CURETTAGE OF UTERUS     LEFT HEART CATH AND CORONARY ANGIOGRAPHY N/A 04/24/2019   Procedure: LEFT HEART CATH AND CORONARY ANGIOGRAPHY;  Surgeon: ENelva Bush MD;  [facility];  Service: Cardiovascular;  Laterality: N/A;   RIGHT/LEFT HEART CATH AND CORONARY ANGIOGRAPHY N/A 08/26/2018   Procedure: RIGHT/LEFT HEART CATH AND CORONARY ANGIOGRAPHY;  Surgeon: AWellington Hampshire  MD;  [facility];  Service: Cardiovascular;  Laterality: N/A;   SHOULDER SURGERY     15 + yrs ago    SKIN SURGERY     nose    VAGINAL HYSTERECTOMY      Family History  Problem Relation Age of Onset   Kidney failure Mother    Hypertension Father    Kidney failure Brother    Colon cancer Neg Hx    Colon polyps Neg Hx    Rectal cancer Neg Hx    Stomach cancer Neg Hx    Breast cancer Neg Hx     Past Medical History, Surgical History, Social History, Family History, Problem List, Medications, and Allergies have been reviewed and updated if relevant.  Review of Systems: Pertinent positives are listed above.  Otherwise, a full 14 point review of systems has been done in full and it is negative except where it is noted positive.  Objective:   BP 130/64    Pulse 89    Temp 98 F (36.7 C) (Oral)    Ht 5' 3.5" (1.613 m)    Wt 174 lb 4 oz (79 kg)    SpO2 97%    BMI 30.38 kg/m  Fall Risk 09/22/2019 09/22/2019 09/23/2019 07/26/2021 12/05/2021  Falls in the past year? - - - - 0  Was there an injury with Fall? - - - - -  Fall Risk Category Calculator - - - - -  Fall Risk Category - - - - -  Patient Fall Risk Level Moderate fall risk Moderate fall risk Moderate fall risk Low fall risk -  Patient at Risk for Falls Due to - - - - -  Fall risk Follow up - - - - -   Ideal Body Weight: Weight in (lb) to have BMI = 25: 143.1 Hearing Screening  Method: Audiometry   500Hz  1000Hz  2000Hz  4000Hz   Right ear 20 20 20 20   Left ear 20 20 20 20   Comments: [facility] 12/05/2021 07/31/2019 02/12/2019 07/27/2017 07/16/2017  Decreased  Interest 0 0 3 0 0  Down, Depressed, Hopeless 0 2 0 0 0  PHQ - 2 Score 0 2 3 0 0  Altered sleeping - 1 0 - -  Tired, decreased energy - 1 3 - -  Change in appetite - 1 3 - -  Feeling bad or failure about yourself  - 0 0 - -  Trouble concentrating - 0 0 - -  Moving slowly or  fidgety/restless - 0 2 - -  Suicidal thoughts - 0 0 - -  PHQ-9 Score - 5 11 - -  Difficult doing work/chores - - Not difficult at all - -  Some recent data might be hidden     GEN: well developed, well nourished, no acute distress Eyes: conjunctiva and lids normal, PERRLA, EOMI ENT: TM clear, nares clear, oral exam WNL Neck: supple, no lymphadenopathy, no thyromegaly, no JVD Pulm: clear to auscultation and percussion, respiratory effort normal CV: regular rate and rhythm, S1-S2, no murmur, rub or gallop, no bruits Chest: no scars, masses, no lumps BREAST: no lumps, no axillary LAD, no nipple discharge GI: soft, non-tender; no hepatosplenomegaly, masses; active bowel sounds all quadrants GU: Normal external female genitalia. Cervix appears intact without lesions or irritation. Vaginal canal normal without ulceration or lesion. Cervix NT to exam. Ovaries neither enlarged nor tender. (Chaperoned examination by female staff) Lymph: no cervical, axillary or inguinal adenopathy MSK: gait normal, muscle tone and strength WNL, no joint swelling, effusions, discoloration, crepitus  SKIN: clear, good turgor, color WNL, no rashes, lesions, or ulcerations Neuro: normal mental status, normal strength, sensation, and motion Psych: alert; oriented to person, place and time, normally interactive and not anxious or depressed in appearance.  All labs reviewed with patient.  Results for orders placed or performed in visit on 12/05/21  Hemoglobin A1c  Result Value Ref Range   Hgb A1c MFr Bld 9.4 (H) 4.6 - 6.5 %  CBC with Differential/Platelet  Result Value Ref Range   WBC 9.5 4.0 - 10.5 K/uL   RBC 4.60 3.87 - 5.11 Mil/uL   Hemoglobin 13.6 12.0 - 15.0 g/dL   HCT 41.6 36.0 - 46.0 %   MCV 90.4 78.0 - 100.0 fl   MCHC 32.8 30.0 - 36.0 g/dL   RDW 15.2 11.5 - 15.5 %   Platelets 167.0 150.0 - 400.0 K/uL   Neutrophils Relative % 60.6 43.0 - 77.0 %   Lymphocytes Relative 31.1 12.0 - 46.0 %   Monocytes  Relative 6.0 3.0 - 12.0 %   Eosinophils Relative 1.8 0.0 - 5.0 %   Basophils Relative 0.5 0.0 - 3.0 %   Neutro Abs 5.8 1.4 - 7.7 K/uL   Lymphs Abs 3.0 0.7 - 4.0 K/uL   Monocytes Absolute 0.6 0.1 - 1.0 K/uL   Eosinophils Absolute 0.2 0.0 - 0.7 K/uL   Basophils Absolute 0.0 0.0 - 0.1 K/uL  Basic metabolic panel  Result Value Ref Range   Sodium 141 135 - 145 mEq/L   Potassium 4.3 3.5 - 5.1 mEq/L   Chloride 103 96 - 112 mEq/L   CO2 28 19 - 32 mEq/L   Glucose, Bld 169 (H) 70 - 99 mg/dL   BUN 19 6 - 23 mg/dL   Creatinine, Ser 1.07 0.40 - 1.20 mg/dL   GFR 52.63 (L) >60.00 mL/min   Calcium 10.5 8.4 - 10.5 mg/dL  Hepatic function panel  Result Value Ref Range   Total Bilirubin 0.6 0.2 - 1.2 mg/dL   Bilirubin, Direct  0.1 0.0 - 0.3 mg/dL   Alkaline Phosphatase 69 39 - 117 U/L   AST 21 0 - 37 U/L   ALT 17 0 - 35 U/L   Total Protein 7.1 6.0 - 8.3 g/dL   Albumin 4.2 3.5 - 5.2 g/dL  Lipid panel  Result Value Ref Range   Cholesterol 261 (H) 0 - 200 mg/dL   Triglycerides 205.0 (H) 0.0 - 149.0 mg/dL   HDL 34.30 (L) >39.00 mg/dL   VLDL 41.0 (H) 0.0 - 40.0 mg/dL   Total CHOL/HDL Ratio 8    NonHDL 226.48   LDL cholesterol, direct  Result Value Ref Range   Direct LDL 187.0 mg/dL    No results found.  Assessment and Plan:     ICD-10-CM   1. Healthcare maintenance  Z00.00     2. Uncontrolled type 2 diabetes mellitus with stage 3 chronic kidney disease, with long-term current use of insulin  IMO0002 Hemoglobin A1c    3. Encounter for long-term (current) use of medications  Z79.899 CBC with Differential/Platelet    Basic metabolic panel    Hepatic function panel    4. Mixed hyperlipidemia  E78.2 Lipid panel    5. Hyperlipidemia associated with type 2 diabetes mellitus (HCC)  E11.69 Lipid panel   E78.5     6. Chronic systolic CHF (congestive heart failure) (HCC)  I50.22     7. Essential hypertension  I10     8. Statin intolerance  Z78.9     9. Coronary artery disease,  non-occlusive  I25.10      She declines all vaccines. She is continuing to try to work on her diet and weight.  In addition to general health maintenance with Medicare wellness:   1.  Hyperlipidemia: she has a longstanding statin intolerance.  She did not tolerate injectable medication, and she will not take either of these in the future.  She is open to trying Zetia again. 2.  Diabetes, very poor control long-term with very high insulin requirements.  Right now her A1c is 9, and this is actually better for her over years.  She brings in an extensive log, and her blood levels do appear to be quite a bit better using traditional insulin syringe and needle compared to auto loader.  She has seen many endocrinologist in the past, and she is going to think about doing this in the future. 3.  Blood pressure is stable. 4.  She is tolerating and compliant with all of her cardiac medication and heart failure medication    Health Maintenance Exam: The patient's preventative maintenance and recommended screening tests for an annual wellness exam were reviewed in full today. Brought up to date unless services declined.  Counselled on the importance of diet, exercise, and its role in overall health and mortality. The patient's FH and SH was reviewed, including their home life, tobacco status, and drug and alcohol status.  Follow-up in 1 year for physical exam or additional follow-up below.  I have personally reviewed the Medicare Annual Wellness questionnaire and have noted 1. The patient's medical and social history 2. Their use of alcohol, tobacco or illicit drugs 3. Their current medications and supplements 4. The patient's functional ability including ADL's, fall risks, home safety risks and hearing or visual             impairment. 5. Diet and physical activities 6. Evidence for depression or mood disorders 7. Reviewed Updated provider list, see scanned forms and [facility] Snapshot.  8.  Reviewed  whether or not the patient has HCPOA or living will, and discussed what this means with the patient.  Recommended she bring in a copy for his chart in [facility].  The patients weight, height, BMI and visual acuity have been recorded in the chart I have made referrals, counseling and provided education to the patient based review of the above and I have provided the pt with a written personalized care plan for preventive services.  I have provided the patient with a copy of your personalized plan for preventive services. Instructed to take the time to review along with their updated medication list.  Follow-up: Return for cholesterol, diabetes, high blood pressure.  Future Appointments  Date Time Provider Friona  12/12/2021  3:00 PM [facility] CCM PHARMACIST [facility]  02/08/2022  3:30 PM Ralene Bathe, MD [facility] None  06/07/2022  2:20 PM Herley Bernardini, [name], MD [facility]    Meds ordered this encounter  Medications   ezetimibe (ZETIA) 10 MG tablet    Sig: Take 1 tablet (10 mg total) by mouth daily.    Dispense:  90 tablet    Refill:  3   There are no discontinued medications. Orders Placed This Encounter  Procedures   LDL cholesterol, direct    Signed,  Leonia Heatherly T. Arius Harnois, MD   Allergies as of 12/05/2021       Reactions   Fish Oil Other (See Comments)   Gout   Glimepiride Other (See Comments)   REACTION: hypoglycemia   Guanfacine Hcl Other (See Comments)   REACTION: unspecified   Other    Splenda caused sugars levels to increase   Rosiglitazone Other (See Comments)   CHF   Shellfish Allergy    Gout        Medication List        Accurate as of December 05, 2021 11:59 PM. If you have any questions, ask your nurse or doctor.          ALIGN PO Take 1 tablet by mouth daily.   aspirin 81 MG EC tablet Take 1 tablet (81 mg total) by mouth daily.   carvedilol 12.5 MG tablet Commonly known as: COREG TAKE 1 TABLET BY MOUTH TWICE DAILY   chlorhexidine 0.12  % solution Commonly known as: PERIDEX RINSE WITH 1 CAPFUL (15ML) FOR 30 SECONDS IN THE MORNING AND IN THE EVENING AFTER TOOTHBRUSHING DO NOT SWALLOW   Ensure Take 237 mLs by mouth daily.   Entresto 49-51 MG Generic drug: sacubitril-valsartan TAKE 1 TABLET BY MOUTH TWICE DAILY   ezetimibe 10 MG tablet Commonly known as: Zetia Take 1 tablet (10 mg total) by mouth daily. Started by: Owens Loffler, MD   HUMULIN R 500 UNIT/ML injection Generic drug: insulin regular human CONCENTRATED Inject 65 Units into the skin 3 (three) times daily with meals. Pt is using U-100 needles - dose is 13 units TID as of 09/30/21 What changed: Another medication with the same name was changed. Make sure you understand how and when to take each.   HumuLIN R U-500 KwikPen 500 UNIT/ML KwikPen Generic drug: insulin regular human CONCENTRATED INJECT 65 UNITS INTO THE SKIN 3 TIMES DAILY WITH MEALS. What changed: See the new instructions.   Insulin Syringe-Needle U-100 31G X 5/16" 0.5 ML Misc Commonly known as: BD Insulin Syringe U/F Use to inject insulin daily   ivabradine 5 MG Tabs tablet Commonly known as: CORLANOR Take 5 mg by mouth daily as needed.   Levemir 100 UNIT/ML injection  Generic drug: insulin detemir INJECT 30 UNITS TOTAL INTO THE SKIN TWICE DAILY AS DIRECTED.   OneTouch Ultra test strip Generic drug: glucose blood USE TO CHECK BLOOD SUGAR UP TO 8 TIMES DAILY   spironolactone 25 MG tablet Commonly known as: ALDACTONE TAKE 1 TABLET BY MOUTH DAILY

## 2021-12-06 ENCOUNTER — Encounter: Payer: Self-pay | Admitting: Family Medicine

## 2021-12-06 DIAGNOSIS — Z789 Other specified health status: Secondary | ICD-10-CM | POA: Insufficient documentation

## 2021-12-07 ENCOUNTER — Telehealth: Payer: Self-pay

## 2021-12-07 NOTE — Progress Notes (Signed)
? ? ?  Chronic Care Management ?Pharmacy Assistant  ? ?Name: Kristy [name]  MRN: [id number] DOB: [birth year] ? ?Reason for Encounter: CCM Counsellor) ?  ?Medications: ?Outpatient Encounter Medications as of 12/07/2021  ?Medication Sig  ? aspirin EC 81 MG EC tablet Take 1 tablet (81 mg total) by mouth daily.  ? carvedilol (COREG) 12.5 MG tablet TAKE 1 TABLET BY MOUTH TWICE DAILY  ? chlorhexidine (PERIDEX) 0.12 % solution RINSE WITH 1 CAPFUL (15ML) FOR 30 SECONDS IN THE MORNING AND IN THE EVENING AFTER TOOTHBRUSHING DO NOT SWALLOW  ? Ensure (ENSURE) Take 237 mLs by mouth daily.  ? ENTRESTO 49-51 MG TAKE 1 TABLET BY MOUTH TWICE DAILY  ? ezetimibe (ZETIA) 10 MG tablet Take 1 tablet (10 mg total) by mouth daily.  ? HUMULIN R U-500 KWIKPEN 500 UNIT/ML KwikPen INJECT 65 UNITS INTO THE SKIN 3 TIMES DAILY WITH MEALS. (Patient taking differently: Pt is using U-100 needles - dose is 13 units TID as of 09/30/21)  ? insulin regular human CONCENTRATED (HUMULIN R) 500 UNIT/ML injection Inject 65 Units into the skin 3 (three) times daily with meals. Pt is using U-100 needles - dose is 13 units TID as of 09/30/21  ? Insulin Syringe-Needle U-100 (BD INSULIN SYRINGE U/F) 31G X 5/16" 0.5 ML MISC Use to inject insulin daily  ? ivabradine (CORLANOR) 5 MG TABS tablet Take 5 mg by mouth daily as needed.  ? LEVEMIR 100 UNIT/ML injection INJECT 30 UNITS TOTAL INTO THE SKIN TWICE DAILY AS DIRECTED.  ? ONETOUCH ULTRA test strip USE TO CHECK BLOOD SUGAR UP TO 8 TIMES DAILY  ? Probiotic Product (ALIGN PO) Take 1 tablet by mouth daily.  ? spironolactone (ALDACTONE) 25 MG tablet TAKE 1 TABLET BY MOUTH DAILY  ? ?No facility-administered encounter medications on file as of 12/07/2021.  ? ?[name] was contacted to remind of upcoming telephone visit with [name] on 12/12/2021 at 3:00. Patient was reminded to have any blood glucose and blood pressure readings available for review at appointment. If unable to reach, a voicemail  was left for patient.  ? ?Patient states she was sent the liquid form on Humulin R from [place] instead of the [place] and it is working where the [employer] was not. Patient has been trying to tell her providers for years that the pens do not work. Patient is very pleased with the pulling her own medication and how the liquid is working for her.  ? ?Star Rating Drugs: ?Medication:  Last Fill: Day Supply ?Entresto 49-34m       12/07/2021 30  ?Humulin R                   PAP  ?Levemir 100unit/ml     12/07/2021       17 ? ?Also picked up today 12/07/2021 from [facility] ?Carvedilol 12.5 mg 12/07/2021 60ds ?One Touch Ultra test strips - 6 bottles  ?Zetia 10 mg 90ds ?Spironolactone 25 mg - patient refused at pharmacy ? ?LCharlene Brooke CPP notified ? ?AMarijean Niemann RMA ?Clinical Pharmacy Assistant ?[phone]? ? ? ?

## 2021-12-12 ENCOUNTER — Other Ambulatory Visit: Payer: Self-pay

## 2021-12-12 ENCOUNTER — Ambulatory Visit (INDEPENDENT_AMBULATORY_CARE_PROVIDER_SITE_OTHER): Payer: Medicare HMO | Admitting: Pharmacist

## 2021-12-12 ENCOUNTER — Telehealth: Payer: Self-pay

## 2021-12-12 NOTE — Progress Notes (Signed)
? ? ?  Chronic Care Management ?Pharmacy Assistant  ? ?Name: MALAINA MORTELLARO  MRN: [id number] DOB: 28-Mar-1951 ? ?Reason for Encounter: CCM ([employer] - Humulin R U-500 Vials) ?  ?Called [employer] in regards to patient receiving vials of Humulin R U-500. Patient received vials of medication in last shipment from [employer] instead of pens. The vials are working much better for the patient and she would like to request vials instead of pens. I called [employer] to request the change. I had to leave a message for [employer] to return my call.  ? ?[name], CPP notified ? ?[name], RMA ?Clinical Pharmacy Assistant ?[phone] ? ? ?

## 2021-12-12 NOTE — Progress Notes (Unsigned)
Chronic Care Management Pharmacy Note  12/12/2021 Name:  Kristy [name] MRN:  [id number] DOB:  1951-07-13  Summary: CCM F/U visit -Pt reports  Recommendations/Changes made from today's visit: -Freestyle Libre -Consider Endocrine -Consider [name] - cardiology/lipid clinic  Plan: ***  Subjective: Kristy [name] is an 71 y.o. year old female who is a primary patient of Copland, [name], MD.  The CCM team was consulted for assistance with disease management and care coordination needs.    Engaged with patient by telephone for follow up visit in response to provider referral for pharmacy case management and/or care coordination services.   Consent to Services:  The patient was given information about Chronic Care Management services, agreed to services, and gave verbal consent prior to initiation of services.  Please see initial visit note for detailed documentation.   Patient Care Team: Owens Loffler, MD as PCP - General (Family Medicine) Rockey Situ Kathlene November, MD as PCP - Cardiology (Cardiology) Minna Merritts, MD as Consulting Physician (Cardiology) [name], [facility] as Pharmacist (Pharmacist)  Recent office visits: 12/05/21 Dr Lorelei Pont OV: AWV. A1c improved to 9.4% using U-500 vials rather than Kwikpen.   04/13/21 - Dr. Owens Loffler, PCP - Pt presented for diabetes follow up. Continue current medications. Follow up 3 months.  Recent consult visits: 05/03/21 - Cardiology, Dr Rockey Situ - Pt presented for CHF. Start Repatha 140 mg every other week [facility].  Hospital visits: None in previous 6 months  Objective:  Lab Results  Component Value Date   CREATININE 1.07 12/05/2021   BUN 19 12/05/2021   GFR 52.63 (L) 12/05/2021   GFRNONAA 70 04/08/2020   GFRAA 80 04/08/2020   NA 141 12/05/2021   K 4.3 12/05/2021   CALCIUM 10.5 12/05/2021   CO2 28 12/05/2021   GLUCOSE 169 (H) 12/05/2021    Lab Results  Component Value Date/Time   HGBA1C 9.4 (H) 12/05/2021 10:35 AM    HGBA1C 11.8 (A) 04/13/2021 02:52 PM   HGBA1C 11.7 (A) 10/04/2020 02:43 PM   HGBA1C 9.7 (H) 12/08/2019 03:00 PM   HGBA1C >14.0 12/02/2018 12:00 AM   GFR 52.63 (L) 12/05/2021 10:35 AM   GFR 55.55 (L) 10/04/2020 04:19 PM   MICROALBUR 18.1 (H) 12/10/2018 10:08 AM   MICROALBUR 36.0 (H) 01/10/2017 03:15 PM    Last diabetic Eye exam:  Lab Results  Component Value Date/Time   HMDIABEYEEXA Retinopathy (A) 04/11/2021 12:00 AM    Last diabetic Foot exam:  up to date  Lab Results  Component Value Date   CHOL 261 (H) 12/05/2021   HDL 34.30 (L) 12/05/2021   LDLCALC 103 (H) 04/22/2019   LDLDIRECT 187.0 12/05/2021   TRIG 205.0 (H) 12/05/2021   CHOLHDL 8 12/05/2021    Hepatic Function Latest Ref Rng & Units 12/05/2021 10/04/2020 12/08/2019  Total Protein 6.0 - 8.3 g/dL 7.1 7.3 6.8  Albumin 3.5 - 5.2 g/dL 4.2 4.5 3.9  AST 0 - 37 U/L _0 ALT 0 - 35 U/L _1 Alk Phosphatase 39 - 117 U/L 69 81 103  Total Bilirubin 0.2 - 1.2 mg/dL 0.6 0.5 0.4  Bilirubin, Direct 0.0 - 0.3 mg/dL 0.1 0.1 0.1   Lab Results  Component Value Date/Time   TSH 1.325 08/07/2019 01:21 PM   TSH 0.586 04/19/2019 06:54 PM   TSH 3.88 07/11/2018 08:46 AM   TSH 1.13 01/10/2017 03:15 PM   FREET4 1.04 04/19/2019 06:54 PM   FREET4 0.98 09/07/2014 02:43 PM    CBC  Latest Ref Rng & Units 12/05/2021 10/04/2020 12/08/2019  WBC 4.0 - 10.5 K/uL 9.5 9.6 10.9(H)  Hemoglobin 12.0 - 15.0 g/dL 13.6 14.3 13.0  Hematocrit 36.0 - 46.0 % 41.6 43.3 40.4  Platelets 150.0 - 400.0 K/uL 167.0 181.0 196.0   Clinical ASCVD: Yes  The ASCVD Risk score (Arnett DK, et al., 2019) failed to calculate for the following reasons:   The patient has a prior MI or stroke diagnosis    Depression screen [facility] 2/9 12/05/2021 07/31/2019 02/12/2019  Decreased Interest 0 0 3  Down, Depressed, Hopeless 0 2 0  PHQ - 2 Score 0 2 3  Altered sleeping - 1 0  Tired, decreased energy - 1 3  Change in appetite - 1 3  Feeling bad or failure about yourself  - 0 0   Trouble concentrating - 0 0  Moving slowly or fidgety/restless - 0 2  Suicidal thoughts - 0 0  PHQ-9 Score - 5 11  Difficult doing work/chores - - Not difficult at all  Some recent data might be hidden    Social History   Tobacco Use  Smoking Status Former   Packs/day: 0.75   Years: 30.00   Pack years: 22.50   Types: Cigarettes   Quit date: 06/06/1997   Years since quitting: 24.5  Smokeless Tobacco Never   BP Readings from Last 3 Encounters:  12/05/21 130/64  07/26/21 127/66  05/03/21 (!) 110/54   Pulse Readings from Last 3 Encounters:  12/05/21 89  07/26/21 80  05/03/21 80   Wt Readings from Last 3 Encounters:  12/05/21 174 lb 4 oz (79 kg)  07/26/21 166 lb (75.3 kg)  05/03/21 167 lb 8 oz (76 kg)   BMI Readings from Last 3 Encounters:  12/05/21 30.38 kg/m  07/26/21 29.41 kg/m  05/03/21 29.67 kg/m    Assessment/Interventions: Review of patient past medical history, allergies, medications, health status, including review of consultants reports, laboratory and other test data, was performed as part of comprehensive evaluation and provision of chronic care management services.   SDOH:  (Social Determinants of Health) assessments and interventions performed: Yes    SDOH Screenings   Alcohol Screen: Not on file  Depression (PHQ2-9): Low Risk    PHQ-2 Score: 0  Financial Resource Strain: Medium Risk   Difficulty of Paying Living Expenses: Somewhat hard  Food Insecurity: Not on file  Housing: Not on file  Physical Activity: Not on file  Social Connections: Not on file  Stress: Not on file  Tobacco Use: Medium Risk   Smoking Tobacco Use: Former   Smokeless Tobacco Use: Never   Passive Exposure: Not on file  Transportation Needs: Not on file    [place]  Allergies  Allergen Reactions   Fish Oil Other (See Comments)    Gout   Glimepiride Other (See Comments)    REACTION: hypoglycemia   Guanfacine Hcl Other (See Comments)    REACTION: unspecified    Other     Splenda caused sugars levels to increase   Rosiglitazone Other (See Comments)    CHF   Shellfish Allergy     Gout    Medications Reviewed Today     Reviewed by [name], CMA (Certified Medical Assistant) on 12/05/21 at 1000  Med List Status: <None>   Medication Order Taking? Sig Documenting Provider Last Dose Status Informant  aspirin EC 81 MG EC tablet [id number] No Take 1 tablet (81 mg total) by mouth daily. Hillary Bow, MD Taking  Active Other  carvedilol (COREG) 12.5 MG tablet [id number] No TAKE 1 TABLET BY MOUTH TWICE DAILY Gollan, Kathlene November, MD Taking Active   chlorhexidine (PERIDEX) 0.12 % solution [id number] No RINSE WITH 1 CAPFUL (15ML) FOR 30 SECONDS IN THE MORNING AND IN THE EVENING AFTER TOOTHBRUSHING DO NOT SWALLOW [provider] Taking Active   Ensure (ENSURE) [id number] No Take 237 mLs by mouth daily. [provider] Taking Active Other  ENTRESTO 49-51 MG [id number] No TAKE 1 TABLET BY MOUTH TWICE DAILY Gollan, Kathlene November, MD Taking Active   HUMULIN R U-500 KWIKPEN 500 UNIT/ML KwikPen [id number] No INJECT 65 UNITS INTO THE SKIN 3 TIMES DAILY WITH MEALS.  Patient taking differently: Pt is using U-100 needles - dose is 13 units TID as of 09/30/21   Copland, [name], MD Taking Active Self  insulin regular human CONCENTRATED (HUMULIN R) 500 UNIT/ML injection [id number] No Inject 65 Units into the skin 3 (three) times daily with meals. Pt is using U-100 needles - dose is 13 units TID as of 09/30/21 [provider] Taking Active Self  Insulin Syringe-Needle U-100 (BD INSULIN SYRINGE U/F) 31G X 5/16" 0.5 ML MISC [id number] No Use to inject insulin daily Copland, Spencer, MD Taking Active   ivabradine (CORLANOR) 5 MG TABS tablet [id number] No Take 5 mg by mouth daily as needed. [provider] Taking Active   LEVEMIR 100 UNIT/ML injection [id number]  INJECT 30 UNITS TOTAL INTO THE SKIN TWICE DAILY AS DIRECTED. Owens Loffler, MD  Active    [facility] ULTRA test strip [id number]  USE TO CHECK BLOOD SUGAR UP TO 8 TIMES DAILY Copland, Spencer, MD  Active   Probiotic Product (ALIGN PO) [id number] No Take 1 tablet by mouth daily. [provider] Taking Active   spironolactone (ALDACTONE) 25 MG tablet [id number]  TAKE 1 TABLET BY MOUTH DAILY Rockey Situ, Kathlene November, MD  Active             Patient Active Problem List   Diagnosis Date Noted   Statin intolerance 12/06/2021   Advanced care planning/counseling discussion: DNR / DNI 10/04/2020   Patient has active power of attorney for health care: [name] 10/04/2020   CKD stage 2 due to type 2 diabetes mellitus ([place]) 10/04/2020   NICM (nonischemic cardiomyopathy) ([place]) 20/35/5974   Chronic systolic CHF (congestive heart failure) ([place]) 08/22/2018   Coronary artery disease, non-occlusive 09/13/2016   Crohn's disease ([place]) 10/13/2013   Major depressive disorder, recurrent, in full remission ([place]) 05/01/2011   PSORIASIS 08/02/2009   Hyperlipidemia associated with type 2 diabetes mellitus ([place]) 05/11/2009   GOUT 02/05/2009   Uncontrolled type 2 diabetes mellitus with stage 3 chronic kidney disease, with long-term current use of insulin    Essential hypertension 04/18/2007   LBBB (left bundle branch block) 04/18/2007   ALLERGIC RHINITIS 04/18/2007    Immunization History  Administered Date(s) Administered   PPD Test 08/29/2019   Td 10/02/1993, 01/06/2008    Conditions to be addressed/monitored:  Hypertension, Hyperlipidemia, and Diabetes   There are no care plans that you recently modified to display for this patient.     Medication Assistance:  [employer] - Humulin R U-500 (approved through 10/01/2022)  Compliance/Adherence/Medication fill history: Care Gaps: ***  Star-Rating Drugs: None  Patient's preferred pharmacy is: [place], [place] - [address] [place] [address] Phone: [phone] Fax:  [phone]  Care Plan and Follow Up Patient Decision:  Patient agrees to Care Plan  and Follow-up.  Follow Up Plan: {CM FOLLOW UP [id number]}  [name], PharmD, BCACP Clinical Pharmacist [facility] [phone]    Current Barriers:  Unable to achieve control of diabetes and cholesterol  Pharmacist Clinical Goal(s):  Patient will contact provider office for questions/concerns as evidenced notation of same in electronic health record through collaboration with PharmD and provider.   Interventions: 1:1 collaboration with Owens Loffler, MD regarding development and update of comprehensive plan of care as evidenced by provider attestation and co-signature Inter-disciplinary care team collaboration (see longitudinal plan of care) Comprehensive medication review performed; medication list updated in electronic medical record  Hyperlipidemia/CAD: (LDL goal < 70) -Uncontrolled - LDL 187 (11/2021). LDL previously <130 in 2011, jumped to 195 in 2015. She has not been able to tolerate any cholesterol medication. -Hx CAD (single-vessel disease, medical mgmt); hx stroke x2 per patient -Current treatment: Ezetimibe 10 mg daily - stopped after 5 days -Medications previously tried: atorvastatin 80, rosuvastatin 5-10, pravastatin 10, Zetia, Repatha - felt awful for 14 days -Educated on Cholesterol goals; majority of cholesterol made endogenously and most medications target this; discussed Praluent and Leqvio - last remaining options for LDL lowering; she would be a candidate for Leqvio but would need to discuss with cardiology/lipid clinic; pt plans to discuss cholesterol at next cardiology appt  Hypertension / Heart Failure (BP goal <130/80) -Controlled - per clinic readings within goal  -HF type: Combined systolic and diastolic -LVEF: 78-67% (6/72/09) - improved, previously < 40% -Current treatment: Carvedilol 12.5 mg BID - Appropriate, Effective, Safe,  Accessible Spironolactone 25 mg daily -Appropriate, Effective, Safe, Accessible Entresto 49-51 mg BID -Appropriate, Effective, Safe, Accessible Ivabradine 5 mg daily PRN -Appropriate, Effective, Safe, Accessible -Medications previously tried: none  -Current home readings: she is checking BP twice daily, 124/76 this morning; 141/80 last night -Counseled to monitor BP at home at least once monthly, document, and provide log at future appointments -Recommended to continue current medication  Diabetes (A1c goal <8%) -Uncontrolled, improving - A1c 9.4% (11/2021), improved from 11.2% after receiving Humulin R U-500 in vials instead of pens, for some reason the Vial insulin is working better.  -She was not able to use [employer] - reports she was denied because she did not have a computer. She has a Stage manager. Per pt, a computer was necessary.  -DM history: Diagnosed with type 2 diabetes 1998, transitioned to insulin 2005.  Multiple episodes of DKA. Pt has seen multiple endocrinologist and frequently lost to follow up. Last visit was with [facility] for initial consult 12/16/19: A1c > 14%. She reports she has discussed insulin pump once several years ago and was told no pump will allow U-500 insulin (this is not the case now). Pt is thinking about endocrinology referral again now as she has finally made some progress with A1c -Pt reports labile sugars affected by stress, caffeine, etc  -Current home glucose readings - checks 8x/day -  Fasting BG < 250 Post-prandial BG < 400 Pt has had low sugars < 60 recently -Current medications: Levemir 100 units/mL - Inject 25 units twice daily - Query appropriate Humulin R U-500 units/mL 65 units TID w/ meals - Appropriate, Query Effective -Medications previously tried: Lantus - not covered by insurance/pt reports Lantus is ineffective, rosiglitazone - HF (pt insists this is the reason she developed HF), glimepiride - hypoglycemia, metformin - unknown,  Invokana - increased thirst, Bydureon - did well but cost concern -Patient is not a candidate for CCM  team BG log reviews -Discussed it is typically inappropriate to take another type of insulin with Humulin R U-500 -Ordered Freestyle Libre through [employer] ([facility]) - awaiting PCP signature -Recommended to continue current medication; will ensure [employer] continues to send Humulin R in vials, not pens  Patient Goals/Self-Care Activities Patient will:  - take medications as prescribed - check glucose daily before meals, 2 hours after meals, and bedtime, document, and provide at future appointments - check blood pressure daily, document, and provide at future appointments

## 2021-12-13 NOTE — Progress Notes (Signed)
Spoke with [employer]. In order for patient to keep receiving vials instead of pens of Humulin R U-500 a new prescription would need to be sent in for the vials. Please send the prescription to [facility]. Fax number: [phone]. ? ?[name], CPP notified ? ?[name], RMA ?Clinical Pharmacy Assistant ?[phone] ? ? ?

## 2021-12-15 MED ORDER — HUMULIN R U-500 (CONCENTRATED) 500 UNIT/ML ~~LOC~~ SOLN: 65.0000 [IU] | Freq: Three times a day (TID) | SUBCUTANEOUS | 1 refills | Status: DC

## 2021-12-15 NOTE — Patient Instructions (Signed)
Visit Information ? ?Phone number for Pharmacist: [phone] ? ? Goals Addressed   ? ?  ?  ?  ?  ? This Visit's Progress  ?  Monitor and Manage My Blood Sugar-Diabetes Type 2     ?  Timeframe:  Long-Range Goal ?Priority:  High ?Start Date:     12/12/21                        ?Expected End Date:    12/13/22                  ? ?Follow Up Date April 2023 ?  ?- check blood sugar at prescribed times ?- check blood sugar if I feel it is too high or too low ?- enter blood sugar readings and medication or insulin into daily log ?- take the blood sugar log to all doctor visits  ?  ?Why is this important?   ?Checking your blood sugar at home helps to keep it from getting very high or very low.  ?Writing the results in a diary or log helps the doctor know how to care for you.  ?Your blood sugar log should have the time, date and the results.  ?Also, write down the amount of insulin or other medicine that you take.  ?Other information, like what you ate, exercise done and how you were feeling, will also be helpful.   ?  ?Notes:  ?  ? ?  ? ? ?Care Plan : [place]  ?Updates made by Charlton Haws, RPH since 12/15/2021 12:00 AM  ?  ? ?Problem: Hypertension, Hyperlipidemia, Diabetes, Heart Failure, and Coronary Artery Disease   ?Priority: High  ?  ? ?Long-Range Goal: Disease mgmt   ?Start Date: 12/15/2021  ?Expected End Date: 12/16/2022  ?This Visit's Progress: On track  ?Priority: High  ?Note:   ?Current Barriers:  ?Unable to achieve control of diabetes and cholesterol ? ?Pharmacist Clinical Goal(s):  ?Patient will contact provider office for questions/concerns as evidenced notation of same in electronic health record through collaboration with PharmD and provider.  ? ?Interventions: ?1:1 collaboration with Owens Loffler, MD regarding development and update of comprehensive plan of care as evidenced by provider attestation and co-signature ?Inter-disciplinary care team collaboration (see longitudinal plan of  care) ?Comprehensive medication review performed; medication list updated in electronic medical record ? ?Hyperlipidemia/CAD: (LDL goal < 70) ?-Uncontrolled - LDL 187 (11/2021). LDL previously <130 in 2011, jumped to 195 in 2015. She has not been able to tolerate any cholesterol medication. ?-Hx CAD (single-vessel disease, medical mgmt); hx stroke x2 per patient ?-Current treatment: ?Ezetimibe 10 mg daily - stopped after 5 days ?-Medications previously tried: atorvastatin 80, rosuvastatin 5-10, pravastatin 10, Zetia, Repatha - felt awful for 14 days ?-Educated on Cholesterol goals; majority of cholesterol made endogenously and most medications target this; discussed Praluent and Leqvio - last remaining options for LDL lowering; she would be a candidate for Leqvio but would need to discuss with cardiology/lipid clinic; pt plans to discuss cholesterol at next cardiology appt ? ?Hypertension / Heart Failure (BP goal <130/80) ?-Controlled - per clinic readings within goal  ?-HF type: Combined systolic and diastolic ?-LVEF: 55-60% (05/11/20) - improved, previously < 40% ?-Current treatment: ?Carvedilol 12.5 mg BID - Appropriate, Effective, Safe, Accessible ?Spironolactone 25 mg daily -Appropriate, Effective, Safe, Accessible ?Entresto 49-51 mg BID -Appropriate, Effective, Safe, Accessible ?Ivabradine 5 mg daily PRN -Appropriate, Effective, Safe, Accessible ?-Medications previously tried: none  ?-  Current home readings: she is checking BP twice daily, 124/76 this morning; 141/80 last night ?-Counseled to monitor BP at home at least once monthly, document, and provide log at future appointments ?-Recommended to continue current medication ? ?Diabetes (A1c goal <8%) ?-Uncontrolled, improving - A1c 9.4% (11/2021), improved from 11.2% after receiving Humulin R U-500 in vials instead of pens, for some reason the Vial insulin is working better.  ?-She was not able to use [employer] - reports she was denied because she did not  have a computer. She has a Stage manager. Per pt, a computer was necessary.  ?-DM history: Diagnosed with type 2 diabetes 1998, transitioned to insulin 2005.  Multiple episodes of DKA. Pt has seen multiple endocrinologist and frequently lost to follow up. Last visit was with [facility] for initial consult 12/16/19: A1c > 14%. She reports she has discussed insulin pump once several years ago and was told no pump will allow U-500 insulin (this is not the case now). Pt is thinking about endocrinology referral again now as she has finally made some progress with A1c ?-Pt reports labile sugars affected by stress, caffeine, etc  ?-Current home glucose readings - checks 8x/day ?Fasting BG: 144, 135, 200, 122, 204, 129, 239, 235, 186 ?Post-prandial BG: 200s-390s ?Pt reports low sugars < 70 recently, however she brought her BG log notebook from past several weeks and lowest BG recorded was 82 (Feb 8th) ?-Current medications: ?Levemir 100 units/mL - Inject 25 units twice daily - Query appropriate ?Humulin R U-500 units/mL 65 units TID w/ meals - Appropriate, Query Effective ?-Medications previously tried: Lantus - not covered by insurance/pt reports Lantus is ineffective, rosiglitazone - HF (pt insists this is the reason she developed HF), glimepiride - hypoglycemia, metformin - unknown, Invokana - increased thirst, Bydureon - did well but cost concern ?-Patient is not a candidate for CCM team BG log reviews ?-Discussed it is typically inappropriate to take another type of insulin with Humulin R U-500; pt insists Levemir has helped "even out" her sugars and prevent low sugars ?-Ordered Freestyle Libre through [employer] ([facility]) - awaiting PCP signature ?-Recommended to continue current medication; will ensure [employer] continues to send Humulin R in vials, not pens ? ?Patient Goals/Self-Care Activities ?Patient will:  ?- take medications as prescribed ?- check glucose daily before meals, 2 hours after  meals, and bedtime, document, and provide at future appointments ?- check blood pressure daily, document, and provide at future appointments ?  ?  ? ?Patient verbalizes understanding of instructions and care plan provided today and agrees to view in [place]. Active MyChart status confirmed with patient.   ?Telephone follow up appointment with pharmacy team member scheduled for: 1 month ? ?[name], PharmD, BCACP ?Clinical Pharmacist ?[facility] ?[phone] ?  ?

## 2021-12-16 DIAGNOSIS — E113493 Type 2 diabetes mellitus with severe nonproliferative diabetic retinopathy without macular edema, bilateral: Secondary | ICD-10-CM | POA: Diagnosis not present

## 2021-12-22 ENCOUNTER — Encounter: Payer: Self-pay | Admitting: Dermatology

## 2021-12-29 ENCOUNTER — Telehealth: Payer: Self-pay | Admitting: Family Medicine

## 2021-12-29 NOTE — Telephone Encounter (Signed)
Kristy [name] with [facility] called reference this patient. ? ?They have been calling the [phone] number in regards to the referral from Dr. Lorelei Pont with no success  Looking for another contact number, or if someone could call the patient and let them know that if [phone] calls them it isn't a scam ? ? ?

## 2021-12-29 NOTE — Telephone Encounter (Signed)
Tried to call [name].  No answer. ?

## 2021-12-30 DIAGNOSIS — Z794 Long term (current) use of insulin: Secondary | ICD-10-CM | POA: Diagnosis not present

## 2021-12-30 DIAGNOSIS — I5040 Unspecified combined systolic (congestive) and diastolic (congestive) heart failure: Secondary | ICD-10-CM

## 2021-12-30 DIAGNOSIS — I110 Hypertensive heart disease with heart failure: Secondary | ICD-10-CM

## 2021-12-30 DIAGNOSIS — I2510 Atherosclerotic heart disease of native coronary artery without angina pectoris: Secondary | ICD-10-CM | POA: Diagnosis not present

## 2021-12-30 DIAGNOSIS — E1159 Type 2 diabetes mellitus with other circulatory complications: Secondary | ICD-10-CM

## 2021-12-30 DIAGNOSIS — E785 Hyperlipidemia, unspecified: Secondary | ICD-10-CM

## 2021-12-30 NOTE — Telephone Encounter (Signed)
Ria Comment, ?Patient has pharmacy visit on 01/18/2022.  Can you relay this information to her at that appointment.  ?

## 2021-12-30 NOTE — Telephone Encounter (Signed)
Tried to call Ms. Delorse Lek.  No answer.  [phone] is the only phone number we have listed for patient.  I do not see where Dr. Lorelei Pont has referred patient to [facility].  Will forward to Dr. Lorelei Pont and pharmacy to see if they know anything about his referral.  ?

## 2022-01-02 ENCOUNTER — Telehealth: Payer: Self-pay

## 2022-01-02 NOTE — Progress Notes (Addendum)
? ? ?  Chronic Care Management ?Pharmacy Assistant  ? ?Name: Kristy [name]  MRN: [id number] DOB: 1951/02/13 ? ? ?Called patient to inform her that the company that supplies the [employer] is trying to contact her. She needs to call them at [phone]. Patient understood and wrote the number down.  ? ?[name], CPP notified ? ?[name], RMA ?Clinical Pharmacy Assistant ?[phone] ? ? ?

## 2022-01-03 NOTE — Progress Notes (Signed)
See encounter dated 01/02/2022. ?

## 2022-01-05 ENCOUNTER — Telehealth: Payer: Self-pay

## 2022-01-05 NOTE — Progress Notes (Signed)
? ? ?Chronic Care Management ?Pharmacy Assistant  ? ?Name: Kristy [name]  MRN: [id number] DOB: 1951-03-23 ? ?Reason for Encounter: CCM (General Adherence) ?  ?Recent office visits:  ?None since last CCM contact ? ?Recent consult visits:  ?None since last CCM contact ? ?Hospital visits:  ?None since last CCM contact ? ?Medications: ?Outpatient Encounter Medications as of 01/05/2022  ?Medication Sig  ? aspirin EC 81 MG EC tablet Take 1 tablet (81 mg total) by mouth daily.  ? carvedilol (COREG) 12.5 MG tablet TAKE 1 TABLET BY MOUTH TWICE DAILY  ? chlorhexidine (PERIDEX) 0.12 % solution RINSE WITH 1 CAPFUL (15ML) FOR 30 SECONDS IN THE MORNING AND IN THE EVENING AFTER TOOTHBRUSHING DO NOT SWALLOW  ? Ensure (ENSURE) Take 237 mLs by mouth daily.  ? ENTRESTO 49-51 MG TAKE 1 TABLET BY MOUTH TWICE DAILY  ? insulin regular human CONCENTRATED (HUMULIN R) 500 UNIT/ML injection Inject 0.13 mLs (65 Units total) into the skin 3 (three) times daily with meals. Pt is using U-100 needles - dose is 13 units TID as of 09/30/21  ? Insulin Syringe-Needle U-100 (BD INSULIN SYRINGE U/F) 31G X 5/16" 0.5 ML MISC Use to inject insulin daily  ? ivabradine (CORLANOR) 5 MG TABS tablet Take 5 mg by mouth daily as needed.  ? LEVEMIR 100 UNIT/ML injection INJECT 30 UNITS TOTAL INTO THE SKIN TWICE DAILY AS DIRECTED. (Patient taking differently: Inject 25 Units into the skin 2 (two) times daily.)  ? ONETOUCH ULTRA test strip USE TO CHECK BLOOD SUGAR UP TO 8 TIMES DAILY  ? Probiotic Product (ALIGN PO) Take 1 tablet by mouth daily.  ? spironolactone (ALDACTONE) 25 MG tablet TAKE 1 TABLET BY MOUTH DAILY  ? ?No facility-administered encounter medications on file as of 01/05/2022.  ? ?Contacted Kristy [name] on 01/06/2022 for general disease state and medication adherence call.  ? ?Star Medications: ?Medication Name/mg Last Fill Days Supply ?No star rating drugs ? ?What concerns do you have about your medications? Patient stated the Spironolactone 25 mg  she was getting that was working [employer]) stopped making. Patient went with a different manufacturer for 1 month and it did not work. Patient gained weight on it. She went from 166 to 174 in three weeks. Patient called around and found some from the original manufacturer Programme researcher, broadcasting/film/video) and bought those. Patient has resumed taking those and she is back to 168 lbs. Patient stated she will see Cardiology in May with Dr. Rockey Situ and she will talk with him to see what she should take. The pharmacy told her [employer] will not start making the medicine again.  ? ?Patient also stated she is approved for [employer]. She heard from them on 01/03/2022 and it will be 3-4 week for delivery of her machine.  ? ?The patient reports the following side effects with their medications. Please see "What concerns do you have about your medications?" ? ?How often do you forget or accidentally miss a dose? Never ? ?Do you use a pillbox? Yes ? ?Are you having any problems getting your medications from your pharmacy? Yes Please see "What concerns do you have about your medications?" ? ?Has the cost of your medications been a concern? No ? ?Since last visit with CPP, the following interventions have been made.  ?Start: Ezetimibe 10 mg daily ? ?The patient has not had an ED visit since last contact.  ? ?The patient denies problems with their health.  ? ?Patient denies concerns or questions for  Kristy [name], PharmD at this time.  ? ?Care Gaps: ?Annual wellness visit in last year? Yes 12/05/2021 ?Most Recent BP reading: 130/64 on 12/05/2021 ? ?If Diabetic: ?Most recent A1C reading: 9.4 on 12/05/2021 ?Last eye exam / retinopathy screening: Up to date ?Last diabetic foot exam: Up to date ? ?Upcoming appointments: ?CCM appointment on 01/18/2022 ? ?Kristy [name], CPP notified ? ?Kristy [name], RMA ?Clinical Pharmacy Assistant ?[phone] ? ? ? ? ? ? ? ? ?

## 2022-01-13 ENCOUNTER — Telehealth: Payer: Self-pay

## 2022-01-13 DIAGNOSIS — E1165 Type 2 diabetes mellitus with hyperglycemia: Secondary | ICD-10-CM | POA: Diagnosis not present

## 2022-01-13 DIAGNOSIS — Z794 Long term (current) use of insulin: Secondary | ICD-10-CM | POA: Diagnosis not present

## 2022-01-13 NOTE — Progress Notes (Signed)
? ? ?  Chronic Care Management ?Pharmacy Assistant  ? ?Name: Kristy [name]  MRN: [id number] DOB: [birth year] ? ?Reason for Encounter: CCM Counsellor) ?  ?Medications: ?Outpatient Encounter Medications as of 01/13/2022  ?Medication Sig  ? aspirin EC 81 MG EC tablet Take 1 tablet (81 mg total) by mouth daily.  ? carvedilol (COREG) 12.5 MG tablet TAKE 1 TABLET BY MOUTH TWICE DAILY  ? chlorhexidine (PERIDEX) 0.12 % solution RINSE WITH 1 CAPFUL (15ML) FOR 30 SECONDS IN THE MORNING AND IN THE EVENING AFTER TOOTHBRUSHING DO NOT SWALLOW  ? Ensure (ENSURE) Take 237 mLs by mouth daily.  ? ENTRESTO 49-51 MG TAKE 1 TABLET BY MOUTH TWICE DAILY  ? insulin regular human CONCENTRATED (HUMULIN R) 500 UNIT/ML injection Inject 0.13 mLs (65 Units total) into the skin 3 (three) times daily with meals. Pt is using U-100 needles - dose is 13 units TID as of 09/30/21  ? Insulin Syringe-Needle U-100 (BD INSULIN SYRINGE U/F) 31G X 5/16" 0.5 ML MISC Use to inject insulin daily  ? ivabradine (CORLANOR) 5 MG TABS tablet Take 5 mg by mouth daily as needed.  ? LEVEMIR 100 UNIT/ML injection INJECT 30 UNITS TOTAL INTO THE SKIN TWICE DAILY AS DIRECTED. (Patient taking differently: Inject 25 Units into the skin 2 (two) times daily.)  ? ONETOUCH ULTRA test strip USE TO CHECK BLOOD SUGAR UP TO 8 TIMES DAILY  ? Probiotic Product (ALIGN PO) Take 1 tablet by mouth daily.  ? spironolactone (ALDACTONE) 25 MG tablet TAKE 1 TABLET BY MOUTH DAILY  ? ?No facility-administered encounter medications on file as of 01/13/2022.  ? ?[name] was contacted to remind of upcoming telephone visit with [name] on 01/18/2022 at 3:00. Patient was reminded to have any blood glucose and blood pressure readings available for review at appointment.  ? ?Patient confirmed appointment. ? ?Are you having any problems with your medications? No  ? ?Do you have any concerns you like to discuss with the pharmacist? No ? ?Star Rating Drugs: ?Medication:  Last  Fill: Day Supply ?No star rating drugs noted ? ?[name], CPP notified ? ?[name], RMA ?Clinical Pharmacy Assistant ?[phone] ? ? ? ? ?

## 2022-01-18 ENCOUNTER — Ambulatory Visit (INDEPENDENT_AMBULATORY_CARE_PROVIDER_SITE_OTHER): Payer: Medicare HMO | Admitting: Pharmacist

## 2022-01-18 DIAGNOSIS — I5022 Chronic systolic (congestive) heart failure: Secondary | ICD-10-CM

## 2022-01-18 DIAGNOSIS — I10 Essential (primary) hypertension: Secondary | ICD-10-CM

## 2022-01-18 DIAGNOSIS — Z794 Long term (current) use of insulin: Secondary | ICD-10-CM

## 2022-01-18 DIAGNOSIS — I2510 Atherosclerotic heart disease of native coronary artery without angina pectoris: Secondary | ICD-10-CM

## 2022-01-18 DIAGNOSIS — E1169 Type 2 diabetes mellitus with other specified complication: Secondary | ICD-10-CM

## 2022-01-18 NOTE — Progress Notes (Signed)
? ?Chronic Care Management ?Pharmacy Note ? ?01/29/2022 ?Name:  Kristy [name] MRN:  [id number] DOB:  28-Jul-1951 ? ?Summary: CCM F/U visit ?-Pt reports fasting BG more consistently < 200, range 122-240. Post-prandial BG still spikes high 200s-300 Overall, improved with U-500 vials instead of pens.  ?-Pt received Lester Kinsman 2 in mail yesterday. She has not set up yet, plans to look at it tonight. Offered office visit to go over it, she declined as she states she will set it up herself ?-Pt stopped ezetimibe after a few days due to "feeling horrible" she has failed every class of antihyperlipidemic, the only remaining options are Nexletol or Leqvio ? ?Recommendations/Changes made from today's visit: ?-Consider Nexletol or Leqvio - pt to follow up with cardiology to discuss lipid options ? ?Plan: ?-Pharmacist follow up televisit scheduled for 1 month ?-PCP f/u 06/07/22 (6 month) ? ? ?Subjective: ?Kristy [name] is an 71 y.o. year old female who is a primary patient of Copland, [name], MD.  The CCM team was consulted for assistance with disease management and care coordination needs.   ? ?Engaged with patient by telephone for follow up visit in response to provider referral for pharmacy case management and/or care coordination services.  ? ?Consent to Services:  ?The patient was given information about Chronic Care Management services, agreed to services, and gave verbal consent prior to initiation of services.  Please see initial visit note for detailed documentation.  ? ?Patient Care Team: ?Owens Loffler, MD as PCP - General (Family Medicine) ?Minna Merritts, MD as PCP - Cardiology (Cardiology) ?Minna Merritts, MD as Consulting Physician (Cardiology) ?Roza Creamer, Cleaster Cor[place], [facility] as Pharmacist (Pharmacist) ? ?Recent office visits: ?12/05/21 Dr Lorelei Pont OV: AWV. A1c improved to 9.4% using U-500 vials rather than Kwikpen.  ? ?04/13/21 - Dr. Owens Loffler, PCP - Pt presented for diabetes follow up. Continue  current medications. Follow up 3 months. ? ?Recent consult visits: ?05/03/21 - Cardiology, Dr Rockey Situ - Pt presented for CHF. Start Repatha 140 mg every other week [place]. ? ?Hospital visits: ?None in previous 6 months ? ?Objective: ? ?Lab Results  ?Component Value Date  ? CREATININE 1.07 12/05/2021  ? BUN 19 12/05/2021  ? GFR 52.63 (L) 12/05/2021  ? GFRNONAA 70 04/08/2020  ? GFRAA 80 04/08/2020  ? NA 141 12/05/2021  ? K 4.3 12/05/2021  ? CALCIUM 10.5 12/05/2021  ? CO2 28 12/05/2021  ? GLUCOSE 169 (H) 12/05/2021  ? ? ?Lab Results  ?Component Value Date/Time  ? HGBA1C 9.4 (H) 12/05/2021 10:35 AM  ? HGBA1C 11.8 (A) 04/13/2021 02:52 PM  ? HGBA1C 11.7 (A) 10/04/2020 02:43 PM  ? HGBA1C 9.7 (H) 12/08/2019 03:00 PM  ? HGBA1C >14.0 12/02/2018 12:00 AM  ? GFR 52.63 (L) 12/05/2021 10:35 AM  ? GFR 55.55 (L) 10/04/2020 04:19 PM  ? MICROALBUR 18.1 (H) 12/10/2018 10:08 AM  ? MICROALBUR 36.0 (H) 01/10/2017 03:15 PM  ?  ?Last diabetic Eye exam:  ?Lab Results  ?Component Value Date/Time  ? HMDIABEYEEXA Retinopathy (A) 04/11/2021 12:00 AM  ?  ?Last diabetic Foot exam:  up to date ? ?Lab Results  ?Component Value Date  ? CHOL 261 (H) 12/05/2021  ? HDL 34.30 (L) 12/05/2021  ? LDLCALC 103 (H) 04/22/2019  ? LDLDIRECT 187.0 12/05/2021  ? TRIG 205.0 (H) 12/05/2021  ? CHOLHDL 8 12/05/2021  ? ? ? ?  Latest Ref Rng & Units 12/05/2021  ? 10:35 AM 10/04/2020  ?  4:19 PM 12/08/2019  ?  3:00  PM  ?Hepatic Function  ?Total Protein 6.0 - 8.3 g/dL 7.1   7.3   6.8    ?Albumin 3.5 - 5.2 g/dL 4.2   4.5   3.9    ?AST 0 - 37 U/L _0 ?ALT 0 - 35 U/L _1 ?Alk Phosphatase 39 - 117 U/L 69   81   103    ?Total Bilirubin 0.2 - 1.2 mg/dL 0.6   0.5   0.4    ?Bilirubin, Direct 0.0 - 0.3 mg/dL 0.1   0.1   0.1    ? ?Lab Results  ?Component Value Date/Time  ? TSH 1.325 08/07/2019 01:21 PM  ? TSH 0.586 04/19/2019 06:54 PM  ? TSH 3.88 07/11/2018 08:46 AM  ? TSH 1.13 01/10/2017 03:15 PM  ? FREET4 1.04 04/19/2019 06:54 PM  ? FREET4 0.98 09/07/2014 02:43 PM   ? ? ? ?  Latest Ref Rng & Units 12/05/2021  ? 10:35 AM 10/04/2020  ?  4:19 PM 12/08/2019  ?  3:00 PM  ?CBC  ?WBC 4.0 - 10.5 K/uL 9.5   9.6   10.9    ?Hemoglobin 12.0 - 15.0 g/dL 13.6   14.3   13.0    ?Hematocrit 36.0 - 46.0 % 41.6   43.3   40.4    ?Platelets 150.0 - 400.0 K/uL 167.0   181.0   196.0    ? ?Clinical ASCVD: Yes  ?The ASCVD Risk score (Arnett DK, et al., 2019) failed to calculate for the following reasons: ?  The patient has a prior MI or stroke diagnosis   ? ? ?  12/05/2021  ? 10:25 AM 07/31/2019  ? 12:20 PM 02/12/2019  ? 11:13 AM  ?Depression screen PHQ 2/9  ?Decreased Interest 0 0 3  ?Down, Depressed, Hopeless 0 2 0  ?PHQ - 2 Score 0 2 3  ?Altered sleeping  1 0  ?Tired, decreased energy  1 3  ?Change in appetite  1 3  ?Feeling bad or failure about yourself   0 0  ?Trouble concentrating  0 0  ?Moving slowly or fidgety/restless  0 2  ?Suicidal thoughts  0 0  ?PHQ-9 Score  5 11  ?Difficult doing work/chores   Not difficult at all  ?  ?Social History  ? ?Tobacco Use  ?Smoking Status Former  ? Packs/day: 0.75  ? Years: 30.00  ? Pack years: 22.50  ? Types: Cigarettes  ? Quit date: 06/06/1997  ? Years since quitting: 24.6  ?Smokeless Tobacco Never  ? ?BP Readings from Last 3 Encounters:  ?12/05/21 130/64  ?07/26/21 127/66  ?05/03/21 (!) 110/54  ? ?Pulse Readings from Last 3 Encounters:  ?12/05/21 89  ?07/26/21 80  ?05/03/21 80  ? ?Wt Readings from Last 3 Encounters:  ?12/05/21 174 lb 4 oz (79 kg)  ?07/26/21 166 lb (75.3 kg)  ?05/03/21 167 lb 8 oz (76 kg)  ? ?BMI Readings from Last 3 Encounters:  ?12/05/21 30.38 kg/m?  ?07/26/21 29.41 kg/m?  ?05/03/21 29.67 kg/m?  ? ? ?Assessment/Interventions: Review of patient past medical history, allergies, medications, health status, including review of consultants reports, laboratory and other test data, was performed as part of comprehensive evaluation and provision of chronic care management services.  ? ?SDOH:  (Social Determinants of Health) assessments and interventions  performed: Yes ? ? ? ?SDOH Screenings  ? ?Alcohol Screen: Not on file  ?Depression (PHQ2-9): Low Risk   ?  PHQ-2 Score: 0  ?Financial Resource Strain: Medium Risk  ? Difficulty of Paying Living Expenses: Somewhat hard  ?Food Insecurity: Not on file  ?Housing: Not on file  ?Physical Activity: Not on file  ?Social Connections: Not on file  ?Stress: Not on file  ?Tobacco Use: Medium Risk  ? Smoking Tobacco Use: Former  ? Smokeless Tobacco Use: Never  ? Passive Exposure: Not on file  ?Transportation Needs: Not on file  ? ? ?Max ? ?Allergies  ?Allergen Reactions  ? Fish Oil Other (See Comments)  ?  Gout  ? Glimepiride Other (See Comments)  ?  REACTION: hypoglycemia  ? Guanfacine Hcl Other (See Comments)  ?  REACTION: unspecified  ? Other   ?  Splenda caused sugars levels to increase  ? Rosiglitazone Other (See Comments)  ?  CHF  ? Shellfish Allergy   ?  Gout  ? ? ?Medications Reviewed Today   ? ? Reviewed by Charlton Haws, RPH (Pharmacist) on 01/29/22 at 1132  Med List Status: <None>  ? ?Medication Order Taking? Sig Documenting Provider Last Dose Status Informant  ?aspirin EC 81 MG EC tablet [id number] Yes Take 1 tablet (81 mg total) by mouth daily. Hillary Bow, MD Taking Active Other  ?carvedilol (COREG) 12.5 MG tablet [id number] Yes TAKE 1 TABLET BY MOUTH TWICE DAILY Gollan, Kathlene November, MD Taking Active   ?chlorhexidine (PERIDEX) 0.12 % solution [id number] Yes RINSE WITH 1 CAPFUL (15ML) FOR 30 SECONDS IN THE MORNING AND IN THE EVENING AFTER TOOTHBRUSHING DO NOT SWALLOW [provider] Taking Active   ?Ensure (ENSURE) [id number] Yes Take 237 mLs by mouth daily. [provider] Taking Active Other  ?ENTRESTO 49-51 MG [id number] Yes TAKE 1 TABLET BY MOUTH TWICE DAILY Gollan, Kathlene November, MD Taking Active   ?insulin regular human CONCENTRATED (HUMULIN R) 500 UNIT/ML injection [id number] Yes Inject 0.13 mLs (65 Units total) into the skin 3 (three) times daily with meals. Pt is using U-100 needles  - dose is 13 units TID as of 09/30/21 Copland, [name], MD Taking Active   ?Insulin Syringe-Needle U-100 (BD INSULIN SYRINGE U/F) 31G X 5/16" 0.5 ML MISC [id number] Yes Use to inject insulin daily Copland, Spe

## 2022-01-28 ENCOUNTER — Other Ambulatory Visit: Payer: Self-pay | Admitting: Family Medicine

## 2022-01-29 DIAGNOSIS — I110 Hypertensive heart disease with heart failure: Secondary | ICD-10-CM

## 2022-01-29 DIAGNOSIS — E1159 Type 2 diabetes mellitus with other circulatory complications: Secondary | ICD-10-CM

## 2022-01-29 DIAGNOSIS — E785 Hyperlipidemia, unspecified: Secondary | ICD-10-CM | POA: Diagnosis not present

## 2022-01-29 DIAGNOSIS — I5040 Unspecified combined systolic (congestive) and diastolic (congestive) heart failure: Secondary | ICD-10-CM | POA: Diagnosis not present

## 2022-01-29 DIAGNOSIS — I2510 Atherosclerotic heart disease of native coronary artery without angina pectoris: Secondary | ICD-10-CM

## 2022-01-29 DIAGNOSIS — Z794 Long term (current) use of insulin: Secondary | ICD-10-CM

## 2022-01-29 NOTE — Patient Instructions (Signed)
Visit Information ? ?Phone number for Pharmacist: [phone] ? ? Goals Addressed   ? ?  ?  ?  ?  ? This Visit's Progress  ?  Monitor and Manage My Blood Sugar-Diabetes Type 2     ?  Timeframe:  Long-Range Goal ?Priority:  High ?Start Date:     12/12/21                        ?Expected End Date:    12/13/22                  ? ?Follow Up Date May 2023 ?  ?- check blood sugar using Freestyle Libre 2 ?- check blood sugar if I feel it is too high or too low ?- enter blood sugar readings and medication or insulin into daily log ?- take the blood sugar log to all doctor visits  ?  ?Why is this important?   ?Checking your blood sugar at home helps to keep it from getting very high or very low.  ?Writing the results in a diary or log helps the doctor know how to care for you.  ?Your blood sugar log should have the time, date and the results.  ?Also, write down the amount of insulin or other medicine that you take.  ?Other information, like what you ate, exercise done and how you were feeling, will also be helpful.   ?  ?Notes:  ?  ? ?  ? ? ?Care Plan : [place]  ?Updates made by Charlton Haws, [place] since 01/29/2022 12:00 AM  ?  ? ?Problem: Hypertension, Hyperlipidemia, Diabetes, Heart Failure, and Coronary Artery Disease   ?Priority: High  ?  ? ?Long-Range Goal: Disease mgmt   ?Start Date: 12/15/2021  ?Expected End Date: 12/16/2022  ?Recent Progress: On track  ?Priority: High  ?Note:   ?Current Barriers:  ?Unable to achieve control of diabetes and cholesterol ? ?Pharmacist Clinical Goal(s):  ?Patient will contact provider office for questions/concerns as evidenced notation of same in electronic health record through collaboration with PharmD and provider.  ? ?Interventions: ?1:1 collaboration with Owens Loffler, MD regarding development and update of comprehensive plan of care as evidenced by provider attestation and co-signature ?Inter-disciplinary care team collaboration (see longitudinal plan of  care) ?Comprehensive medication review performed; medication list updated in electronic medical record ? ?Hyperlipidemia/CAD: (LDL goal < 70) ?-Uncontrolled - LDL 187 (11/2021). LDL previously <130 in 2011, jumped to 195 in 2015. She has not been able to tolerate any cholesterol medication. ?-Hx CAD (single-vessel disease, medical mgmt); hx stroke x2 per patient ?-Current treatment: ?None ?-Medications previously tried: atorvastatin 80, rosuvastatin 5-10, pravastatin 10, Zetia, Repatha - felt awful for 14 days ?-Educated on Cholesterol goals; majority of cholesterol made endogenously and most medications target this; discussed Praluent and Leqvio - last remaining options for LDL lowering; she would be a candidate for Leqvio but would need to discuss with cardiology/lipid clinic; pt plans to discuss cholesterol at next cardiology appt ? ?Hypertension / Heart Failure (BP goal <130/80) ?-Controlled - per clinic readings within goal  ?-HF type: Combined systolic and diastolic ?-LVEF: 55-60% (05/11/20) - improved, previously < 40% ?-Current home readings: she is checking BP twice daily, 124/76 this morning; 141/80 last night ?-Current treatment: ?Carvedilol 12.5 mg BID - Appropriate, Effective, Safe, Accessible ?Spironolactone 25 mg daily -Appropriate, Effective, Safe, Accessible ?Entresto 49-51 mg BID -Appropriate, Effective, Safe, Accessible ?Ivabradine 5 mg daily PRN -Appropriate, Effective,  Safe, Accessible ?-Medications previously tried: none  ?-Counseled to monitor BP at home at least once monthly, document, and provide log at future appointments ?-Recommended to continue current medication ? ?Diabetes (A1c goal <8%) ?-Uncontrolled, improving - A1c 9.4% (11/2021), improved from 11.2% after receiving Humulin R U-500 in vials instead of pens, for some reason the Vial insulin is working better.  ?-Pt received Freestyle Libre 2 shipment, she has not set it up yet, plans to do that today ?-DM history: Diagnosed with type 2  diabetes 1998, transitioned to insulin 2005.  Multiple episodes of DKA. Pt has seen multiple endocrinologist and frequently lost to follow up. Last visit was with [facility] for initial consult 12/16/19: A1c > 14%. She reports she has discussed insulin pump once several years ago and was told no pump will allow U-500 insulin (this is not the case now). Pt is thinking about endocrinology referral again now as she has finally made some progress with A1c ?-Pt reports labile sugars affected by stress, caffeine, etc  ?-Current home glucose readings - checks 8x/day; Range:122-240 ?-Current medications: ?Levemir 100 units/mL - Inject 25 units twice daily - Query appropriate ?Humulin R U-500 units/mL 65 units TID w/ meals - Appropriate, Query Effective ?Freestyle Libre 2 (not started yet) ?-Medications previously tried: Lantus (pt reports ineffective); rosiglitazone (HF-pt insists this is the reason she developed HF), glimepiride (hypoglycemia); metformin (unknown); Invokana (increased thirst); Bydureon (cost/difficult administration) ?-Patient is not a candidate for CCM team BG log reviews ?-Discussed it is typically inappropriate to take another type of insulin with Humulin R U-500; pt insists Levemir has helped "even out" her sugars and prevent low sugars ?-Offered office visit to help set up [facility], pt declined and will read instructions herself ?-Discussed possibility of starting Trulicity (already approved for [employer] so cost would not be an issue), pt does not want to start new medication now but will think about it for future ?-Recommended to continue current medication ? ?Patient Goals/Self-Care Activities ?Patient will:  ?- take medications as prescribed ?- check glucose daily before meals, 2 hours after meals, and bedtime, document, and provide at future appointments ?- check blood pressure daily, document, and provide at future appointments ?  ?  ? ?Patient verbalizes understanding of  instructions and care plan provided today and agrees to view in [place]. Active MyChart status confirmed with patient.   ?Telephone follow up appointment with pharmacy team member scheduled for: 1 month ? ?[name], PharmD, BCACP ?Clinical Pharmacist ?[facility] ?[phone] ?  ?

## 2022-02-03 DIAGNOSIS — E113493 Type 2 diabetes mellitus with severe nonproliferative diabetic retinopathy without macular edema, bilateral: Secondary | ICD-10-CM | POA: Diagnosis not present

## 2022-02-08 ENCOUNTER — Ambulatory Visit: Payer: Medicare HMO | Admitting: Dermatology

## 2022-02-08 DIAGNOSIS — L820 Inflamed seborrheic keratosis: Secondary | ICD-10-CM | POA: Diagnosis not present

## 2022-02-08 DIAGNOSIS — M10441 Other secondary gout, right hand: Secondary | ICD-10-CM | POA: Diagnosis not present

## 2022-02-08 DIAGNOSIS — L821 Other seborrheic keratosis: Secondary | ICD-10-CM | POA: Diagnosis not present

## 2022-02-08 DIAGNOSIS — M109 Gout, unspecified: Secondary | ICD-10-CM

## 2022-02-08 DIAGNOSIS — L578 Other skin changes due to chronic exposure to nonionizing radiation: Secondary | ICD-10-CM | POA: Diagnosis not present

## 2022-02-08 NOTE — Patient Instructions (Addendum)

## 2022-02-08 NOTE — Progress Notes (Signed)
   Follow-Up Visit   Subjective  Kristy [name] is a 71 y.o. female who presents for the following: ISKs to treat (R arm and hand, to treat today). The patient has spots, moles and lesions to be evaluated, some may be new or changing and the patient has concerns that these could be cancer.  The following portions of the chart were reviewed this encounter and updated as appropriate:   Tobacco  Allergies  Meds  Problems  Med Hx  Surg Hx  Fam Hx     Review of Systems:  No other skin or systemic complaints except as noted in HPI or Assessment and Plan.  Objective  Well appearing patient in no apparent distress; mood and affect are within normal limits.  A focused examination was performed including right arm, right hand. Relevant physical exam findings are noted in the Assessment and Plan.  R hand x 22 (22) Stuck on waxy paps with erythema  R index, R 2nd finger DIP joint swelling with mild pinkness   Assessment & Plan  Inflamed seborrheic keratosis (22) R hand x 22 Symptomatic, irritating, patient would like treated. Destruction of lesion - R hand x 22 Complexity: simple   Destruction method: cryotherapy   Informed consent: discussed and consent obtained   Timeout:  patient name, date of birth, surgical site, and procedure verified Lesion destroyed using liquid nitrogen: Yes   Region frozen until ice ball extended beyond lesion: Yes   Outcome: patient tolerated procedure well with no complications   Post-procedure details: wound care instructions given    Gout of right hand,  R index, R 2nd finger Vs Arthritis, Benign Follow-up with primary care physician.  Seborrheic Keratoses - Stuck-on, waxy, tan-brown papules and/or plaques  - Benign-appearing - Discussed benign etiology and prognosis. - Observe - Call for any changes  Actinic Damage - chronic, secondary to cumulative UV radiation exposure/sun exposure over time - diffuse scaly erythematous macules with  underlying dyspigmentation - Recommend daily broad spectrum sunscreen SPF 30+ to sun-exposed areas, reapply every 2 hours as needed.  - Recommend staying in the shade or wearing long sleeves, sun glasses (UVA+UVB protection) and wide brim hats (4-inch brim around the entire circumference of the hat). - Call for new or changing lesions.  Return in about 3 weeks (around 03/01/2022) for ISK to treat.  I, Othelia Pulling, RMA, am acting as scribe for Sarina Ser, MD . Documentation: I have reviewed the above documentation for accuracy and completeness, and I agree with the above.  Sarina Ser, MD

## 2022-02-10 ENCOUNTER — Telehealth: Payer: Self-pay

## 2022-02-10 NOTE — Progress Notes (Signed)
? ? ?  Chronic Care Management ?Pharmacy Assistant  ? ?Name: Kristy [name]  MRN: [id number] DOB: 1951-05-26 ? ?Reason for Encounter: CCM Counsellor) ?  ?Medications: ?Outpatient Encounter Medications as of 02/10/2022  ?Medication Sig  ? aspirin EC 81 MG EC tablet Take 1 tablet (81 mg total) by mouth daily.  ? carvedilol (COREG) 12.5 MG tablet TAKE 1 TABLET BY MOUTH TWICE DAILY  ? chlorhexidine (PERIDEX) 0.12 % solution RINSE WITH 1 CAPFUL (15ML) FOR 30 SECONDS IN THE MORNING AND IN THE EVENING AFTER TOOTHBRUSHING DO NOT SWALLOW  ? Ensure (ENSURE) Take 237 mLs by mouth daily.  ? ENTRESTO 49-51 MG TAKE 1 TABLET BY MOUTH TWICE DAILY  ? insulin detemir (LEVEMIR) 100 UNIT/ML injection Inject 0.25 mLs (25 Units total) into the skin 2 (two) times daily.  ? insulin regular human CONCENTRATED (HUMULIN R) 500 UNIT/ML injection Inject 0.13 mLs (65 Units total) into the skin 3 (three) times daily with meals. Pt is using U-100 needles - dose is 13 units TID as of 09/30/21  ? Insulin Syringe-Needle U-100 (BD INSULIN SYRINGE U/F) 31G X 5/16" 0.5 ML MISC Use to inject insulin daily  ? ivabradine (CORLANOR) 5 MG TABS tablet Take 5 mg by mouth daily as needed.  ? ONETOUCH ULTRA test strip USE TO CHECK BLOOD SUGAR UP TO 8 TIMES DAILY  ? Probiotic Product (ALIGN PO) Take 1 tablet by mouth daily.  ? spironolactone (ALDACTONE) 25 MG tablet TAKE 1 TABLET BY MOUTH DAILY  ? ?No facility-administered encounter medications on file as of 02/10/2022.  ? ?Kristy [name] was contacted to remind of upcoming telephone visit with [name] on 02/15/2022 at 11:45. Patient was reminded to have any blood glucose and blood pressure readings available for review at appointment.  ? ?Message was left reminding patient of appointment. ? ?Star Rating Drugs: ?Medication:  Last Fill: Day Supply ?No star rating drugs noted ? ?[name], CPP notified ? ?[name], RMA ?Clinical Pharmacy Assistant ?[phone] ? ? ? ? ?

## 2022-02-13 ENCOUNTER — Other Ambulatory Visit: Payer: Self-pay | Admitting: Family Medicine

## 2022-02-13 DIAGNOSIS — Z794 Long term (current) use of insulin: Secondary | ICD-10-CM | POA: Diagnosis not present

## 2022-02-13 DIAGNOSIS — E1165 Type 2 diabetes mellitus with hyperglycemia: Secondary | ICD-10-CM | POA: Diagnosis not present

## 2022-02-15 ENCOUNTER — Ambulatory Visit: Payer: Medicare HMO | Admitting: Pharmacist

## 2022-02-15 ENCOUNTER — Telehealth: Payer: Self-pay | Admitting: Pharmacist

## 2022-02-15 DIAGNOSIS — Z794 Long term (current) use of insulin: Secondary | ICD-10-CM

## 2022-02-15 DIAGNOSIS — I2510 Atherosclerotic heart disease of native coronary artery without angina pectoris: Secondary | ICD-10-CM

## 2022-02-15 DIAGNOSIS — E785 Hyperlipidemia, unspecified: Secondary | ICD-10-CM

## 2022-02-15 DIAGNOSIS — E119 Type 2 diabetes mellitus without complications: Secondary | ICD-10-CM

## 2022-02-15 DIAGNOSIS — E1140 Type 2 diabetes mellitus with diabetic neuropathy, unspecified: Secondary | ICD-10-CM

## 2022-02-15 DIAGNOSIS — I5022 Chronic systolic (congestive) heart failure: Secondary | ICD-10-CM

## 2022-02-15 DIAGNOSIS — G720 Drug-induced myopathy: Secondary | ICD-10-CM

## 2022-02-15 DIAGNOSIS — I10 Essential (primary) hypertension: Secondary | ICD-10-CM

## 2022-02-15 DIAGNOSIS — E782 Mixed hyperlipidemia: Secondary | ICD-10-CM

## 2022-02-15 NOTE — Telephone Encounter (Signed)
Patient reports she is ready to see an endocrinologist again to help with insulin adjustment and DM control. She has been lost to follow up with previous specialists because DM control never improved, but she is more optimistic this time because her sugars are improved and A1c is <10% for the first time in years. She is wearing [employer] and believes an endocrinologist will be able to help her now. ? ?She is ok with going to [facility] for endocrine. She previously saw Dr Cruzita Lederer at [place] in 2017 - I don't believe that office is accepting referrals right now. Per referral coordinator, the following [place] offices are accepting referrals right now: ? ?[facility] -- they are booking out far but are working in Urgent cases ?[facility] [place] [facility] [address] ? ?Routing to PCP for assistance. ?

## 2022-02-15 NOTE — Patient Instructions (Signed)
Visit Information ? ?Phone number for Pharmacist: [phone] ? ? Goals Addressed   ? ?  ?  ?  ?  ? This Visit's Progress  ?  Monitor and Manage My Blood Sugar-Diabetes Type 2     ?  Timeframe:  Long-Range Goal ?Priority:  High ?Start Date:     12/12/21                        ?Expected End Date:    12/13/22                  ? ?Follow Up Date July 2023 ?  ?- check blood sugar using Freestyle Libre 2 ?- check blood sugar if I feel it is too high or too low ?- enter blood sugar readings and medication or insulin into daily log ?- take the blood sugar log to all doctor visits  ?  ?Why is this important?   ?Checking your blood sugar at home helps to keep it from getting very high or very low.  ?Writing the results in a diary or log helps the doctor know how to care for you.  ?Your blood sugar log should have the time, date and the results.  ?Also, write down the amount of insulin or other medicine that you take.  ?Other information, like what you ate, exercise done and how you were feeling, will also be helpful.   ?  ?Notes:  ?  ? ?  ? ? ?Care Plan : [place]  ?Updates made by Charlton Haws, RPH since 02/15/2022 12:00 AM  ?  ? ?Problem: Hypertension, Hyperlipidemia, Diabetes, Heart Failure, and Coronary Artery Disease   ?Priority: High  ?  ? ?Long-Range Goal: Disease mgmt   ?Start Date: 12/15/2021  ?Expected End Date: 12/16/2022  ?This Visit's Progress: On track  ?Recent Progress: On track  ?Priority: High  ?Note:   ?Current Barriers:  ?Unable to achieve control of diabetes and cholesterol ? ?Pharmacist Clinical Goal(s):  ?Patient will contact provider office for questions/concerns as evidenced notation of same in electronic health record through collaboration with PharmD and provider.  ? ?Interventions: ?1:1 collaboration with Owens Loffler, MD regarding development and update of comprehensive plan of care as evidenced by provider attestation and co-signature ?Inter-disciplinary care team  collaboration (see longitudinal plan of care) ?Comprehensive medication review performed; medication list updated in electronic medical record ? ?Hyperlipidemia/CAD: (LDL goal < 70) ?-Uncontrolled - LDL 187 (11/2021). LDL previously <130 in 2011, jumped to 195 in 2015. She has not been able to tolerate any cholesterol medication. ?-Hx CAD (single-vessel disease, medical mgmt); hx stroke x2 per patient ?-Current treatment: ?None ?-Medications previously tried: atorvastatin 80, rosuvastatin 5-10, pravastatin 10, Zetia, Repatha - felt awful for 14 days ?-Educated on Cholesterol goals; majority of cholesterol made endogenously and most medications target this; discussed Praluent and Leqvio - last remaining options for LDL lowering; she would be a candidate for Leqvio but would need to discuss with cardiology/lipid clinic; pt plans to discuss cholesterol at next cardiology appt ? ?Hypertension / Heart Failure (BP goal <130/80) ?-Controlled - per clinic readings within goal; pt reports only a certain brand of spironolactone works for her, she notes 15 lb wt gain this year after spironolactone mfr was changed at her pharmacy, she attributed wt gain to inefficacy of new pill, she denies changes in BP; Discussed insulin change around the same time is much more likely to cause that degree of wt  gain over a more prolonged period ?-Pt will need refill on spironolactone at end of June. Not clear if she needs cardiology appt prior to that.  ?-HF type: Combined systolic and diastolic ?-LVEF: 55-60% (05/11/20) - improved, previously < 40% ?-Current home readings: 127/74 ?-Current treatment: ?Carvedilol 12.5 mg BID - Appropriate, Effective, Safe, Accessible ?Spironolactone 25 mg daily -Appropriate, Effective, Safe, Accessible ?Entresto 49-51 mg BID -Appropriate, Effective, Safe, Accessible ?Ivabradine 5 mg daily PRN -Appropriate, Effective, Safe, Accessible ?-Medications previously tried: none  ?-Counseled to monitor BP at home at  least once monthly, document, and provide log at future appointments ?-Recommended to continue current medication; advised pt to contact cardiology for appt ? ?Diabetes (A1c goal <8%) ?-Uncontrolled, improving - A1c 9.4% (11/2021), improved from 11.2% after receiving Humulin R U-500 in vials instead of pens, for some reason the Vial insulin is working better. She reports she will need refill on Humulin R U-500 soon (from [facility]) ?-She does report 15 lb wt gain earlier this year (see above - pt thought this could be to new spironolactone pill not working as well) - discussed insulin is more likely to cause this wt gain, she has been able to lose much of the weight and is back to her baseline  ?-Current home glucose readings - via FL2. Using reader device. Average glucose 186 but she reports significant fluctuations (<70, > 250) which is part of the reason she is willing to see endocrine again for insulin adjustment ?-DM history: Diagnosed with type 2 diabetes 1998, transitioned to insulin 2005.  Multiple episodes of DKA. Pt has seen multiple endocrinologist and frequently lost to follow up. Today she reports she is ready to see endocrinologist again - she mentions Dr Cruzita Lederer. Per chart see saw Dr Cruzita Lederer in 2017 but was lost to follow up. ?-Current medications: ?Levemir 25 units BID -  Query appropriate ?Humulin R U-500 - 65 units (13u w/ U-100 syringe) TID w/ meals (PAP)- Appropriate, Query Effective ?Freestyle Libre 2 (Advanced Diabetes supply) - Appropriate, Effective, Safe, Accessible ?-Medications previously tried: Lantus (pt reports ineffective); rosiglitazone (HF-pt insists this is the reason she developed HF), glimepiride (hypoglycemia); metformin (unknown); Invokana (increased thirst); Bydureon (cost/difficult administration) ?-Patient is not a candidate for CCM team BG log reviews ?-Discussed it is typically inappropriate to take another type of insulin with Humulin R U-500; pt insists Levemir has  helped "even out" her sugars and prevent low sugars ?-Previously discussed possibility of starting Trulicity (already approved for [facility] so cost would not be an issue), pt does not want to start new medication now but will think about it for future ?-Pharm assistant will request insulin refill from [employer] ?-Recommended to continue current medication; coordinate with PCP for endocrine referral ? ?Patient Goals/Self-Care Activities ?Patient will:  ?- take medications as prescribed ?- check glucose daily before meals, 2 hours after meals, and bedtime, document, and provide at future appointments ?- check blood pressure daily, document, and provide at future appointments ?  ?  ? ?Patient verbalizes understanding of instructions and care plan provided today and agrees to view in [place]. Active MyChart status and patient understanding of how to access instructions and care plan via MyChart confirmed with patient.    ?Telephone follow up appointment with pharmacy team member scheduled for: 2 months ? ?[name], PharmD, BCACP ?Clinical Pharmacist ?[facility] ?[phone] ?  ?

## 2022-02-15 NOTE — Addendum Note (Signed)
Addended by: Owens Loffler on: 02/15/2022 01:32 PM ? ? Modules accepted: Orders ? ?

## 2022-02-15 NOTE — Progress Notes (Signed)
? ?Chronic Care Management ?Pharmacy Note ? ?02/15/2022 ?Name:  Kristy [name] MRN:  [id number] DOB:  01-25-1951 ? ?Summary: CCM F/U visit ?-Pt is wearing Freestyle Libre and is very happy with it; she reports 14-d average glucose 186. She still has wide fluctuations with lows < 70 and highs > 250. She is now interested in seeing an endocrinologist now that DM control is improving and she may need tighter insulin adjustments ? ?Recommendations/Changes made from today's visit: ?-Coordinate with PCP for endocrine referral - [place] ? ?Plan: ?-Pharmacist follow up televisit scheduled for 2 months ?-PCP f/u 06/07/22 (6 month) ? ? ? ?Subjective: ?Kristy [name] is an 71 y.o. year old female who is a primary patient of Copland, [name], MD.  The CCM team was consulted for assistance with disease management and care coordination needs.   ? ?Engaged with patient by telephone for follow up visit in response to provider referral for pharmacy case management and/or care coordination services.  ? ?Consent to Services:  ?The patient was given information about Chronic Care Management services, agreed to services, and gave verbal consent prior to initiation of services.  Please see initial visit note for detailed documentation.  ? ?Patient Care Team: ?Owens Loffler, MD as PCP - General (Family Medicine) ?Minna Merritts, MD as PCP - Cardiology (Cardiology) ?Minna Merritts, MD as Consulting Physician (Cardiology) ?Deserie Dirks, Cle[place], [facility] as Pharmacist (Pharmacist) ? ?Recent office visits: ?12/05/21 Dr Lorelei Pont OV: AWV. A1c improved to 9.4% using U-500 vials rather than Kwikpen.  ? ?04/13/21 - Dr. Owens Loffler, PCP - Pt presented for diabetes follow up. Continue current medications. Follow up 3 months. ? ?Recent consult visits: ?05/03/21 - Cardiology, Dr Rockey Situ - Pt presented for CHF. Start Repatha 140 mg every other week [place]. ? ?Hospital visits: ?None in previous 6 months ? ?Objective: ? ?Lab Results  ?Component Value  Date  ? CREATININE 1.07 12/05/2021  ? BUN 19 12/05/2021  ? GFR 52.63 (L) 12/05/2021  ? GFRNONAA 70 04/08/2020  ? GFRAA 80 04/08/2020  ? NA 141 12/05/2021  ? K 4.3 12/05/2021  ? CALCIUM 10.5 12/05/2021  ? CO2 28 12/05/2021  ? GLUCOSE 169 (H) 12/05/2021  ? ? ?Lab Results  ?Component Value Date/Time  ? HGBA1C 9.4 (H) 12/05/2021 10:35 AM  ? HGBA1C 11.8 (A) 04/13/2021 02:52 PM  ? HGBA1C 11.7 (A) 10/04/2020 02:43 PM  ? HGBA1C 9.7 (H) 12/08/2019 03:00 PM  ? HGBA1C >14.0 12/02/2018 12:00 AM  ? GFR 52.63 (L) 12/05/2021 10:35 AM  ? GFR 55.55 (L) 10/04/2020 04:19 PM  ? MICROALBUR 18.1 (H) 12/10/2018 10:08 AM  ? MICROALBUR 36.0 (H) 01/10/2017 03:15 PM  ?  ?Last diabetic Eye exam:  ?Lab Results  ?Component Value Date/Time  ? HMDIABEYEEXA Retinopathy (A) 04/11/2021 12:00 AM  ?  ?Last diabetic Foot exam:  up to date ? ?Lab Results  ?Component Value Date  ? CHOL 261 (H) 12/05/2021  ? HDL 34.30 (L) 12/05/2021  ? LDLCALC 103 (H) 04/22/2019  ? LDLDIRECT 187.0 12/05/2021  ? TRIG 205.0 (H) 12/05/2021  ? CHOLHDL 8 12/05/2021  ? ? ? ?  Latest Ref Rng & Units 12/05/2021  ? 10:35 AM 10/04/2020  ?  4:19 PM 12/08/2019  ?  3:00 PM  ?Hepatic Function  ?Total Protein 6.0 - 8.3 g/dL 7.1   7.3   6.8    ?Albumin 3.5 - 5.2 g/dL 4.2   4.5   3.9    ?AST 0 - 37 U/L 21  22   17    ?ALT 0 - 35 U/L _0 ?Alk Phosphatase 39 - 117 U/L 69   81   103    ?Total Bilirubin 0.2 - 1.2 mg/dL 0.6   0.5   0.4    ?Bilirubin, Direct 0.0 - 0.3 mg/dL 0.1   0.1   0.1    ? ?Lab Results  ?Component Value Date/Time  ? TSH 1.325 08/07/2019 01:21 PM  ? TSH 0.586 04/19/2019 06:54 PM  ? TSH 3.88 07/11/2018 08:46 AM  ? TSH 1.13 01/10/2017 03:15 PM  ? FREET4 1.04 04/19/2019 06:54 PM  ? FREET4 0.98 09/07/2014 02:43 PM  ? ? ? ?  Latest Ref Rng & Units 12/05/2021  ? 10:35 AM 10/04/2020  ?  4:19 PM 12/08/2019  ?  3:00 PM  ?CBC  ?WBC 4.0 - 10.5 K/uL 9.5   9.6   10.9    ?Hemoglobin 12.0 - 15.0 g/dL 13.6   14.3   13.0    ?Hematocrit 36.0 - 46.0 % 41.6   43.3   40.4    ?Platelets 150.0 -  400.0 K/uL 167.0   181.0   196.0    ? ?Clinical ASCVD: Yes  ?The ASCVD Risk score (Arnett DK, et al., 2019) failed to calculate for the following reasons: ?  The patient has a prior MI or stroke diagnosis   ? ? ?  12/05/2021  ? 10:25 AM 07/31/2019  ? 12:20 PM 02/12/2019  ? 11:13 AM  ?Depression screen PHQ 2/9  ?Decreased Interest 0 0 3  ?Down, Depressed, Hopeless 0 2 0  ?PHQ - 2 Score 0 2 3  ?Altered sleeping  1 0  ?Tired, decreased energy  1 3  ?Change in appetite  1 3  ?Feeling bad or failure about yourself   0 0  ?Trouble concentrating  0 0  ?Moving slowly or fidgety/restless  0 2  ?Suicidal thoughts  0 0  ?PHQ-9 Score  5 11  ?Difficult doing work/chores   Not difficult at all  ?  ?Social History  ? ?Tobacco Use  ?Smoking Status Former  ? Packs/day: 0.75  ? Years: 30.00  ? Pack years: 22.50  ? Types: Cigarettes  ? Quit date: 06/06/1997  ? Years since quitting: 24.7  ?Smokeless Tobacco Never  ? ?BP Readings from Last 3 Encounters:  ?12/05/21 130/64  ?07/26/21 127/66  ?05/03/21 (!) 110/54  ? ?Pulse Readings from Last 3 Encounters:  ?12/05/21 89  ?07/26/21 80  ?05/03/21 80  ? ?Wt Readings from Last 3 Encounters:  ?12/05/21 174 lb 4 oz (79 kg)  ?07/26/21 166 lb (75.3 kg)  ?05/03/21 167 lb 8 oz (76 kg)  ? ?BMI Readings from Last 3 Encounters:  ?12/05/21 30.38 kg/m?  ?07/26/21 29.41 kg/m?  ?05/03/21 29.67 kg/m?  ? ? ?Assessment/Interventions: Review of patient past medical history, allergies, medications, health status, including review of consultants reports, laboratory and other test data, was performed as part of comprehensive evaluation and provision of chronic care management services.  ? ?SDOH:  (Social Determinants of Health) assessments and interventions performed: Yes ?SDOH Interventions   ? ?Flowsheet Row Most Recent Value  ?SDOH Interventions   ?Food Insecurity Interventions Intervention Not Indicated  ?Financial Strain Interventions Other (Comment)  [[place] Cares - Humulin]  ? ?  ? ? ? ?SDOH Screenings   ? ?Alcohol Screen: Not on file  ?Depression (PHQ2-9): Low Risk   ? PHQ-2 Score: 0  ?Financial  Resource Strain: Low Risk   ? Difficulty of Paying Living Expenses: Not very hard  ?Food Insecurity: No Food Insecurity  ? Worried About Charity fundraiser in the Last Year: Never true  ? Ran Out of Food in the Last Year: Never true  ?Housing: Not on file  ?Physical Activity: Not on file  ?Social Connections: Not on file  ?Stress: Not on file  ?Tobacco Use: Medium Risk  ? Smoking Tobacco Use: Former  ? Smokeless Tobacco Use: Never  ? Passive Exposure: Not on file  ?Transportation Needs: Not on file  ? ? ?[place] ? ?Allergies  ?Allergen Reactions  ? Fish Oil Other (See Comments)  ?  Gout  ? Glimepiride Other (See Comments)  ?  REACTION: hypoglycemia  ? Guanfacine Hcl Other (See Comments)  ?  REACTION: unspecified  ? Other   ?  Splenda caused sugars levels to increase  ? Rosiglitazone Other (See Comments)  ?  CHF  ? Shellfish Allergy   ?  Gout  ? ? ?Medications Reviewed Today   ? ? Reviewed by Larence Penning, CMA (Certified Medical Assistant) on 02/08/22 at [place] List Status: <None>  ? ?Medication Order Taking? Sig Documenting Provider Last Dose Status Informant  ?aspirin EC 81 MG EC tablet [id number] No Take 1 tablet (81 mg total) by mouth daily. Hillary Bow, MD Taking Active Other  ?carvedilol (COREG) 12.5 MG tablet [id number] No TAKE 1 TABLET BY MOUTH TWICE DAILY Gollan, Kathlene November, MD Taking Active   ?chlorhexidine (PERIDEX) 0.12 % solution [id number] No RINSE WITH 1 CAPFUL (15ML) FOR 30 SECONDS IN THE MORNING AND IN THE EVENING AFTER TOOTHBRUSHING DO NOT SWALLOW [provider] Taking Active   ?Ensure (ENSURE) [id number] No Take 237 mLs by mouth daily. [provider] Taking Active Other  ?ENTRESTO 49-51 MG [id number] No TAKE 1 TABLET BY MOUTH TWICE DAILY Gollan, Kathlene November, MD Taking Active   ?insulin detemir (LEVEMIR) 100 UNIT/ML injection [id number]  Inject 0.25 mLs (25 Units total) into the  skin 2 (two) times daily. Copland, [name], MD  Active   ?insulin regular human CONCENTRATED (HUMULIN R) 500 UNIT/ML injection [id number] No Inject 0.13 mLs (65 Units total) into the skin 3 (three) times daily

## 2022-02-15 NOTE — Telephone Encounter (Signed)
I just put in the consult to [place] on [place].  Thanks so much Mendel Ryder! ?

## 2022-02-17 ENCOUNTER — Telehealth: Payer: Self-pay

## 2022-02-17 ENCOUNTER — Telehealth: Payer: Self-pay | Admitting: Cardiovascular Disease

## 2022-02-17 NOTE — Progress Notes (Addendum)
    Chronic Care Management Pharmacy Assistant   Name: Kristy [name]  MRN: [id number] DOB: [birth year]  Reason for Encounter: CCM (Humulin Refill)   [facility] to request refill for Humulin U-500 in vials not pens as previously prescribed. Delivery is expected Tuesday (05/23) or Wednesday (05/24). Called patient to inform her; patient expressed understanding.   [name], CPP notified  [name], [place] Clinical Pharmacy Assistant [phone]

## 2022-02-17 NOTE — Telephone Encounter (Signed)
Called to speak with patient.   Patient reports that she's had DM and CHF for 25 years, and found out that in December she switched to just plain insulin from a pre-measured pen, and since then she has gained 14-15 pounds.   Denies any swelling in her extremities, but states that her hands and arms "are dead" when she wakes up, but that goes away quickly.   Also reports that she thinks that spironolactone manufactured by a specific manufacturer is the only one that works and they are stopping making it. She states that she has enough from that manufacturer for one month.   The chest pain that she's having is just uncomfortable, not a true pain. Pain is above left breast and comes and goes very quickly. Denies any N/V.   Reports that her BP has been low, yesterday was 70/50, this morning was 98/50. She has been very sleepy in the afternoon.   Pt was very all over the place during entirety of call.   Offered patient appt with Christell Faith, PA-C on 5/31 at 2:20, patient accepted. Pt voiced appreciation for the call and understands that she can call our office with any other concerns prior to her appt.

## 2022-02-17 NOTE — Telephone Encounter (Signed)
Pt c/o medication issue:  1. Name of Medication:  spironolactone (ALDACTONE) 25 MG tablet  2. How are you currently taking this medication (dosage and times per day)?  As prescribed   3. Are you having a reaction (difficulty breathing--STAT)?   4. What is your medication issue?   Patient states this medication in ineffective and would like to know if she needs to be put on an alternative. She states she has taken Spironolactone for about 2 months with no relief. She is still retaining fluid and having CP that comes and goes.  Pt c/o of Chest Pain: STAT if CP now or developed within 24 hours  1. Are you having CP right now?  Not currently, but CP comes and goes quickly. Since we've been on the phone patient states she had a brief moment where the pain came back and went away instantly  2. Are you experiencing any other symptoms (ex. SOB, nausea, vomiting, sweating)?  Fluid retention  3. How long have you been experiencing CP?  Past few months   4. Is your CP continuous or coming and going? Coming and going   5. Have you taken Nitroglycerin?  No, patient states she doesn't have any nitro  ?

## 2022-02-22 ENCOUNTER — Encounter: Payer: Self-pay | Admitting: Dermatology

## 2022-02-28 NOTE — Progress Notes (Unsigned)
Cardiology Office Note    Date:  03/01/2022   ID:  [name], [name] [birth year], MRN [id number]  PCP:  Owens Loffler, MD  Cardiologist:  Ida Rogue, MD  Electrophysiologist:  None   Chief Complaint: Follow-up  History of Present Illness:   Kristy [name] is a 71 y.o. female with history of nonobstructive CAD, HFimpEF secondary to NICM, uncontrolled diabetes with recurrent DKA, Crohn's disease, HTN, HLD, LBBB, prior tobacco use, gout, and GERD who presents for evaluation of dyspnea, chest discomfort, and weight gain.  She was initially diagnosed with cardiomyopathy in 2010 with an EF of 45% at that time.  Diagnostic cath showed minimal, nonobstructive CAD and she was medically managed.  Patient had recovery of LV systolic function later in 2010 with echo showing an EF of 50 to 55%.  Over the years, she has struggled with controlling her diabetes and has required recurrent admissions for DKA with AKI in the past requiring intermittent cessation of heart failure therapy.  In 08/2018 she developed worsening dyspnea with repeat echo showing an EF of 20 to 25%.  She underwent diagnostic cath which again showed predominantly nonobstructive CAD with more significant stenosis in a small diagonal branch.  Continued medical therapy was recommended.  Repeat echo in 12/2018 showed subsequent improvement in LV systolic function with an EF of 50 to 55%, moderate concentric LVH, diastolic dysfunction, normal RV systolic function, normal RV cavity size, mildly dilated left atrium, no significant valvular abnormality.  She was admitted in 04/2019 with altered mental status with difficulty with word finding preceded by a fall in the bathtub.  She was found on the floor by a family member.  She was noted to be febrile and in DKA upon her admission.  High-sensitivity troponin was elevated, peaking at 5667.  Echo on 04/21/2019 showed an EF of 35 to 40%, severe hypokinesis of the mid apical anterior wall and  anteroseptal wall, normal RV systolic function, normal RV cavity size, trileaflet aortic valve with mild aortic annular calcification, mild mitral annular calcification, normal size and structure aortic root.  Imaging of the head/brain was unrevealing for stroke.  Prior to discharge, the patient underwent diagnostic cath which showed stable appearance of coronary artery since 08/2018 without new lesion to explain recent decline in LVEF or elevated troponin.  It was felt the elevated troponin may be due to supply demand ischemia and or stress-induced cardiomyopathy in the setting of severe DKA.    Most recent echo from 05/2020 demonstrated an EF of 55 to 60%, no regional wall motion abnormalities, grade 1 diastolic dysfunction, mildly reduced RV systolic function with normal ventricular cavity size, and trivial mitral regurgitation.  She was last seen in the office in 05/2021 and was without symptoms of angina or decompensation.  She called the office on 02/17/2022 noting several complaints including weight gain of 14 to 15 pounds, dyspnea, and chest tightness.  She also reported several medication issues including recent changing of her insulin and concerns regarding the manufacturer of her spironolactone.  She also reported that her arms were "dead" in the morning.  She reported low BP with readings of 70/50 and at 98/50 at the time of her phone call.  She comes in today noting an increase in upper left-sided chest discomfort and dyspnea.  She has also noted an increase in her weight following transition of her insulin.  Her weight is up 5 pounds today by our scale when compared to her visit in 05/2021.  She is concerned some of this may be related to a different manufacturer of her spironolactone.  She was able to contact several pharmacies and obtain spironolactone from the current manufacturer of her current prescription, though has been informed these pills will not be available moving forward as pharmacies  have transitioned to a different manufacturer.  No significant orthopnea or early satiety.  She also reported a history of right and left upper extremity numbness if she was laying on either her right or left side.  Since that she has had improvement in her blood glucose levels, this numbness has improved.   Labs independently reviewed: 11/2021 - direct LDL 187, TC 261, TG 205, HDL 34, albumin 4.2, AST/ALT normal, potassium 4.3, BUN 19, serum creatinine 1.07, Hgb 13.6, PLT 167, A1c 9.4 08/2019 - TSH normal  Past Medical History:  Diagnosis Date   Allergic rhinitis    Basal cell carcinoma 08/17/2014   Left nasal ala- Mohs at [facility]   Cataract    mild    Crohn's disease ([place]) 10/13/2013   Diverticulosis    GERD (gastroesophageal reflux disease)    Gout    HFrEF (heart failure with reduced ejection fraction) ([place])    a. 12/2008 Cath: EF 45% w/ inf HK; b. 07/2009 Echo: EF 50-55%; c. 08/2018 Echo: EF 20-25%.   Hyperlipidemia    Hypertension    IBS (irritable bowel syndrome)    Left bundle branch block    Neuromuscular disorder (HCC)    neuropathy   NICM (nonischemic cardiomyopathy) ([place])    a. 12/2008 Cath: no significant dzs, EF 45% w/ inf HK->Med Rx; b. 07/2009 Echo: EF 50-55%; c. 08/2018 Echo: EF 20-25%, ant/antsept HK, mild MR, mildly dil LA, nl RV fx; d. 08/2018 Cath: D1 80, otw nonobs dzs->Med rx.   Non-obstructive CAD (coronary artery disease)    a. 12/2008 Cath: no significant dzs, EF 45% w/ inf HK->Med Rx; b. 08/2018 Cath: LM nl, LAD min irregs, D1 80, LCX 6md, RCA min irregs->Med Rx.   Osteoarthritis    Patient has active power of attorney for health care: TOretha Caprice01/11/2020   Symptomatic cholelithiasis    Uncontrolled type 2 diabetes mellitus with stage 3 chronic kidney disease, with long-term current use of insulin          Past Surgical History:  Procedure Laterality Date   BREAST BIOPSY Right 06/24/2019   stereo bx/ x clip/ neg   BREAST CYST ASPIRATION     CARDIAC  CATHETERIZATION     CATARACT EXTRACTION W/PHACO Right 07/26/2021   Procedure: CATARACT EXTRACTION PHACO AND INTRAOCULAR LENS PLACEMENT ([place] DIABETIC;  Surgeon: PBirder Robson MD;  [facility]  Service: Ophthalmology;  Laterality: Right;  Diabetic 8.99 01:10.2   COLONOSCOPY     DILATION AND CURETTAGE OF UTERUS     LEFT HEART CATH AND CORONARY ANGIOGRAPHY N/A 04/24/2019   Procedure: LEFT HEART CATH AND CORONARY ANGIOGRAPHY;  Surgeon: ENelva Bush MD;  [facility];  Service: Cardiovascular;  Laterality: N/A;   RIGHT/LEFT HEART CATH AND CORONARY ANGIOGRAPHY N/A 08/26/2018   Procedure: RIGHT/LEFT HEART CATH AND CORONARY ANGIOGRAPHY;  Surgeon: AWellington Hampshire MD;  [facility];  Service: Cardiovascular;  Laterality: N/A;   SHOULDER SURGERY     15 + yrs ago    SKIN SURGERY     nose    VAGINAL HYSTERECTOMY      Current Medications: Current Meds  Medication Sig   aspirin EC  81 MG EC tablet Take 1 tablet (81 mg total) by mouth daily.   BD INSULIN SYRINGE U/F 31G X 5/16" 0.5 ML MISC USE AS DIRECTED   carvedilol (COREG) 12.5 MG tablet TAKE 1 TABLET BY MOUTH TWICE DAILY   chlorhexidine (PERIDEX) 0.12 % solution RINSE WITH 1 CAPFUL (15ML) FOR 30 SECONDS IN THE MORNING AND IN THE EVENING AFTER TOOTHBRUSHING DO NOT SWALLOW   Ensure (ENSURE) Take 237 mLs by mouth daily.   ENTRESTO 49-51 MG TAKE 1 TABLET BY MOUTH TWICE DAILY   insulin detemir (LEVEMIR) 100 UNIT/ML injection Inject 0.25 mLs (25 Units total) into the skin 2 (two) times daily.   insulin regular human CONCENTRATED (HUMULIN R) 500 UNIT/ML injection Inject 0.13 mLs (65 Units total) into the skin 3 (three) times daily with meals. Pt is using U-100 needles - dose is 13 units TID as of 09/30/21   ivabradine (CORLANOR) 5 MG TABS tablet Take 5 mg by mouth daily as needed.   ONETOUCH ULTRA test strip USE TO CHECK BLOOD SUGAR UP TO 8 TIMES DAILY   Probiotic Product (ALIGN PO)  Take 1 tablet by mouth daily.   spironolactone (ALDACTONE) 25 MG tablet TAKE 1 TABLET BY MOUTH DAILY    Allergies:   Fish oil, Glimepiride, Guanfacine hcl, Other, Rosiglitazone, and Shellfish allergy   Social History   Socioeconomic History   Marital status: Widowed    Spouse name: Not on file   Number of children: Not on file   Years of education: Not on file   Highest education level: Not on file  Occupational History   Not on file  Tobacco Use   Smoking status: Former    Packs/day: 0.75    Years: 30.00    Pack years: 22.50    Types: Cigarettes    Quit date: 06/06/1997    Years since quitting: 24.7   Smokeless tobacco: Never  Vaping Use   Vaping Use: Never used  Substance and Sexual Activity   Alcohol use: No   Drug use: No   Sexual activity: Yes  Other Topics Concern   Not on file  Social History Narrative   Not on file   Social Determinants of Health   Financial Resource Strain: Low Risk    Difficulty of Paying Living Expenses: Not very hard  Food Insecurity: No Food Insecurity   Worried About Charity fundraiser in the Last Year: Never true   Ran Out of Food in the Last Year: Never true  Transportation Needs: Not on file  Physical Activity: Not on file  Stress: Not on file  Social Connections: Not on file     Family History:  The patient's family history includes Hypertension in her father; Kidney failure in her brother and mother. There is no history of Colon cancer, Colon polyps, Rectal cancer, Stomach cancer, or Breast cancer.  ROS:   12-point review of systems is negative unless otherwise noted in HPI.   EKGs/Labs/Other Studies Reviewed:    Studies reviewed were summarized above. The additional studies were reviewed today:  2D echo 05/11/2020: 1. Left ventricular ejection fraction, by estimation, is 55 to 60%. The  left ventricle has normal function. The left ventricle has no regional  wall motion abnormalities. Left ventricular diastolic  parameters are  consistent with Grade I diastolic  dysfunction (impaired relaxation). Elevated left atrial pressure.   2. Right ventricular systolic function is mildly reduced. The right  ventricular size is normal. Tricuspid regurgitation signal is inadequate  for assessing PA pressure.   3. The mitral valve was not well visualized. Trivial mitral valve  regurgitation. No evidence of mitral stenosis.   4. The aortic valve is tricuspid. Aortic valve regurgitation is not  visualized. No aortic stenosis is present. __________  [facility] 04/24/2019: Conclusions: Stable appearance of coronary arteries since 08/2018 without new lesion to explain recent decline in LVEF or troponin elevation.  I suspect elevated troponin may be due to supply-demand mismatch and/or stress-induced cardiomyopathy in the setting of severe DKA. Focal D1 stenosis remains ~80%.  There is mild to moderate, non-obstructive disease involving the LAD and LCx. Mildly elevated left ventricular filling pressure.   Recommendations: Continue optimization of evidence-based heart failure.  Will continue carvedilol 3.125 mg BID and restart Entresto today. Maintain net even fluid balance; could consider started gentle diuresis tomorrow as renal function allows. Dual antiplatelet therapy with aspirin and clopidogrel for 12 months. Aggressive secondary prevention. __________  2D echo 04/21/2019: 1. Severe hypokinesis of the left ventricular, mid-apical anterior wall  and anteroseptal wall.   2. The left ventricle has moderately reduced systolic function, with an  ejection fraction of 35-40%. The cavity size was normal. Left ventricular  diastolic function could not be evaluated.   3. The right ventricle has normal systolic function. The cavity was  normal. There is mildly increased right ventricular wall thickness.   4. Left atrial size was not well visualized.   5. The aortic valve is tricuspid. Mild thickening of the aortic valve.   Aortic valve regurgitation was not assessed by color flow Doppler. Mild  aortic annular calcification noted.   6. The mitral valve was not well visualized. There is mild mitral annular  calcification present.   7. The aortic root is normal in size and structure.   8. The interatrial septum was not well visualized. ___________  2D echo 12/10/2018: 1. The left ventricle has low normal systolic function, with an ejection  fraction of 50-55%. The cavity size was normal. There is moderate  concentric left ventricular hypertrophy. Left ventricular diastolic  Doppler parameters are consistent with  impaired relaxation.   2. The right ventricle has normal systolic function. The cavity was  normal. There is no increase in right ventricular wall thickness.   3. Left atrial size was mildly dilated.   4. The mitral valve is grossly normal.   5. The tricuspid valve is grossly normal.   6. The aortic valve is grossly normal.   7. Significant improvement in EF since last echo. __________  [facility] 08/26/2018: Ost 1st Diag to 1st Diag lesion is 80% stenosed. Mid Cx to Dist Cx lesion is 40% stenosed.   1.  Significant one-vessel coronary artery disease involving first diagonal with mild to moderate disease in the main vessels.   2.  Left ventricular angiography was not performed.  EF was severely reduced by echo. 3.  Right heart catheterization showed normal filling pressures, normal pulmonary pressure and mildly reduced cardiac output at 3.91 with a cardiac index of 2.23.     Recommendations: The patient has nonischemic cardiomyopathy.  Recommend up titration of heart failure medications.  I am going to ask her to increase carvedilol to 6.25 mg twice daily given that she has been taking 3.25 mg twice daily.  Recommend switching lisinopril to Entresto and adding spironolactone.  If ejection fraction remains low after 3 months of optimal medical therapy, consider CRT ICD placement given underlying left  bundle branch block. __________  2D echo 08/15/2018: -  Left ventricle: The cavity size was mildly dilated. Systolic    function was severely reduced. The estimated ejection fraction    was in the range of 20% to 25%. Hypokinesis of the anterior    myocardium. Hypokinesis of the anteroseptal myocardium. The study    is not technically sufficient to allow evaluation of LV diastolic    function.  - Mitral valve: There was mild regurgitation.  - Left atrium: The atrium was mildly dilated.  - Right ventricle: Systolic function was normal.  - Pulmonary arteries: Systolic pressure was within the normal    range.   EKG:  EKG is ordered today.  The EKG ordered today demonstrates NSR, 85 bpm, LBBB (known)  Recent Labs: 12/05/2021: ALT 17; BUN 19; Creatinine, Ser 1.07; Hemoglobin 13.6; Platelets 167.0; Potassium 4.3; Sodium 141  Recent Lipid Panel    Component Value Date/Time   CHOL 261 (H) 12/05/2021 1035   TRIG 205.0 (H) 12/05/2021 1035   HDL 34.30 (L) 12/05/2021 1035   CHOLHDL 8 12/05/2021 1035   VLDL 41.0 (H) 12/05/2021 1035   LDLCALC 103 (H) 04/22/2019 0700   LDLDIRECT 187.0 12/05/2021 1035    PHYSICAL EXAM:    VS:  BP (!) 144/72 (BP Location: Left Arm, Patient Position: Sitting, Cuff Size: Normal)   Pulse 85   Ht 5' 3"  (1.6 m)   Wt 172 lb 4 oz (78.1 kg)   SpO2 98%   BMI 30.51 kg/m   BMI: Body mass index is 30.51 kg/m.  Physical Exam Constitutional:      Appearance: She is well-developed.  HENT:     Head: Normocephalic and atraumatic.  Eyes:     General:        Right eye: No discharge.        Left eye: No discharge.  Neck:     Vascular: No JVD.  Cardiovascular:     Rate and Rhythm: Normal rate and regular rhythm.     Pulses:          Radial pulses are 2+ on the right side and 2+ on the left side.       Dorsalis pedis pulses are 2+ on the right side and 2+ on the left side.       Posterior tibial pulses are 2+ on the right side and 2+ on the left side.     Heart  sounds: Normal heart sounds, S1 normal and S2 normal. Heart sounds not distant. No midsystolic click and no opening snap. No murmur heard.   No friction rub.  Pulmonary:     Effort: Pulmonary effort is normal. No respiratory distress.     Breath sounds: Normal breath sounds. No decreased breath sounds, wheezing or rales.  Chest:     Chest wall: No tenderness.  Abdominal:     General: There is no distension.     Palpations: Abdomen is soft.     Tenderness: There is no abdominal tenderness.  Musculoskeletal:     Cervical back: Normal range of motion.     Right lower leg: No edema.     Left lower leg: No edema.     Comments: 5 out of 5 strength in the bilateral upper and lower extremities.  Skin:    General: Skin is warm and dry.     Nails: There is no clubbing.  Neurological:     Mental Status: She is alert and oriented to person, place, and time.  Psychiatric:  Speech: Speech normal.        Behavior: Behavior normal.        Thought Content: Thought content normal.        Judgment: Judgment normal.    Wt Readings from Last 3 Encounters:  03/01/22 172 lb 4 oz (78.1 kg)  12/05/21 174 lb 4 oz (79 kg)  07/26/21 166 lb (75.3 kg)     ASSESSMENT & PLAN:   Nonobstructive CAD: Currently chest pain-free.  Most recent LHC from 04/2019 showed stable nonobstructive CAD with medical management recommended.  She does note a history of dyspnea and chest discomfort.  We will pursue an echo to evaluate for recurrence of her cardiomyopathy.  If her LV systolic function remains preserved, we will pursue a Lexiscan MPI to evaluate for high risk ischemia.  However, if she has redevelopment of cardiomyopathy, we will need to pursue diagnostic cardiac cath.  Continue aggressive risk factor modification and current medical therapy including aspirin and carvedilol.  HFimpEF: She appears euvolemic and well compensated.  Most recent echo from 05/2020 showed normalization of LV systolic function.  She  remains on carvedilol, Entresto, and spironolactone.  Not requiring a standing diuretic.  Obtain echo.  Based on findings, consider addition of SGLT2 inhibitor.  HLD: Intolerant to statins, ezetimibe, and PCSK9 inhibitor.  Recommend heart healthy diet.  Could consider referral to lipid clinic for consideration of bempedoic acid down the road.  HTN: Blood pressure is mildly elevated in the office today.  No changes were made in medical therapy.  Continue to monitor.    Disposition: F/u with Dr. Rockey Situ or an APP in 3 months.   Medication Adjustments/Labs and Tests Ordered: Current medicines are reviewed at length with the patient today.  Concerns regarding medicines are outlined above. Medication changes, Labs and Tests ordered today are summarized above and listed in the Patient Instructions accessible in Encounters.   Signed, Christell Faith, PA-C 03/01/2022 4:22 PM     [facility] [address] [place], [place] [address] [phone]

## 2022-03-01 ENCOUNTER — Encounter: Payer: Self-pay | Admitting: Physician Assistant

## 2022-03-01 ENCOUNTER — Ambulatory Visit: Payer: Medicare HMO | Admitting: Dermatology

## 2022-03-01 ENCOUNTER — Ambulatory Visit: Payer: Medicare HMO | Admitting: Physician Assistant

## 2022-03-01 VITALS — BP 144/72 | HR 85 | Ht 63.0 in | Wt 172.2 lb

## 2022-03-01 DIAGNOSIS — I5022 Chronic systolic (congestive) heart failure: Secondary | ICD-10-CM | POA: Diagnosis not present

## 2022-03-01 DIAGNOSIS — I428 Other cardiomyopathies: Secondary | ICD-10-CM

## 2022-03-01 DIAGNOSIS — I2510 Atherosclerotic heart disease of native coronary artery without angina pectoris: Secondary | ICD-10-CM | POA: Diagnosis not present

## 2022-03-01 DIAGNOSIS — E785 Hyperlipidemia, unspecified: Secondary | ICD-10-CM | POA: Diagnosis not present

## 2022-03-01 DIAGNOSIS — I10 Essential (primary) hypertension: Secondary | ICD-10-CM

## 2022-03-01 NOTE — Patient Instructions (Addendum)
Medication Instructions:  No changes at this time.   *If you need a refill on your cardiac medications before your next appointment, please call your pharmacy*   Lab Work: None  If you have labs (blood work) drawn today and your tests are completely normal, you will receive your results only by: [place] (if you have MyChart) OR A paper copy in the mail If you have any lab test that is abnormal or we need to change your treatment, we will call you to review the results.   Testing/Procedures: Your physician has requested that you have an echocardiogram. Echocardiography is a painless test that uses sound waves to create images of your heart. It provides your doctor with information about the size and shape of your heart and how well your heart's chambers and valves are working. This procedure takes approximately one hour. There are no restrictions for this procedure.    Follow-Up: At [facility], you and your health needs are our priority.  As part of our continuing mission to provide you with exceptional heart care, we have created designated Provider Care Teams.  These Care Teams include your primary Cardiologist (physician) and Advanced Practice Providers (APPs -  Physician Assistants and Nurse Practitioners) who all work together to provide you with the care you need, when you need it.    Your next appointment:   3 month(s)  The format for your next appointment:   In Person  Provider:   Ida Rogue, MD or Christell Faith, PA-C      Important Information About Sugar

## 2022-03-03 DIAGNOSIS — E113493 Type 2 diabetes mellitus with severe nonproliferative diabetic retinopathy without macular edema, bilateral: Secondary | ICD-10-CM | POA: Diagnosis not present

## 2022-03-10 ENCOUNTER — Ambulatory Visit (INDEPENDENT_AMBULATORY_CARE_PROVIDER_SITE_OTHER): Payer: Medicare HMO

## 2022-03-10 DIAGNOSIS — I2510 Atherosclerotic heart disease of native coronary artery without angina pectoris: Secondary | ICD-10-CM | POA: Diagnosis not present

## 2022-03-10 DIAGNOSIS — I5022 Chronic systolic (congestive) heart failure: Secondary | ICD-10-CM

## 2022-03-10 DIAGNOSIS — I361 Nonrheumatic tricuspid (valve) insufficiency: Secondary | ICD-10-CM | POA: Diagnosis not present

## 2022-03-10 DIAGNOSIS — I5030 Unspecified diastolic (congestive) heart failure: Secondary | ICD-10-CM

## 2022-03-10 DIAGNOSIS — I428 Other cardiomyopathies: Secondary | ICD-10-CM | POA: Diagnosis not present

## 2022-03-10 LAB — ECHOCARDIOGRAM COMPLETE
Area-P 1/2: 3.23 cm2
Calc EF: 52.7 %
S' Lateral: 3 cm
Single Plane A2C EF: 52.1 %
Single Plane A4C EF: 57 %

## 2022-03-10 MED ORDER — PERFLUTREN LIPID MICROSPHERE
1.0000 mL | INTRAVENOUS | Status: AC | PRN
  Administered 2022-03-10: 4 mL via INTRAVENOUS

## 2022-03-16 DIAGNOSIS — E1165 Type 2 diabetes mellitus with hyperglycemia: Secondary | ICD-10-CM | POA: Diagnosis not present

## 2022-03-16 DIAGNOSIS — Z794 Long term (current) use of insulin: Secondary | ICD-10-CM | POA: Diagnosis not present

## 2022-03-23 ENCOUNTER — Ambulatory Visit: Payer: Medicare HMO | Admitting: Dermatology

## 2022-03-23 DIAGNOSIS — L578 Other skin changes due to chronic exposure to nonionizing radiation: Secondary | ICD-10-CM | POA: Diagnosis not present

## 2022-03-23 DIAGNOSIS — L821 Other seborrheic keratosis: Secondary | ICD-10-CM | POA: Diagnosis not present

## 2022-03-23 DIAGNOSIS — L820 Inflamed seborrheic keratosis: Secondary | ICD-10-CM | POA: Diagnosis not present

## 2022-03-23 NOTE — Patient Instructions (Addendum)
Cryotherapy Aftercare  Wash gently with soap and water everyday.   Apply Vaseline and Band-Aid daily until healed.     Due to recent changes in healthcare laws, you may see results of your pathology and/or laboratory studies on MyChart before the doctors have had a chance to review them. We understand that in some cases there may be results that are confusing or concerning to you. Please understand that not all results are received at the same time and often the doctors may need to interpret multiple results in order to provide you with the best plan of care or course of treatment. Therefore, we ask that you please give [place] 2 business days to thoroughly review all your results before contacting the office for clarification. Should we see a critical lab result, you will be contacted sooner.   If You Need Anything After Your Visit  If you have any questions or concerns for your doctor, please call our main line at [phone] and press option 4 to reach your doctor's medical assistant. If no one answers, please leave a voicemail as directed and we will return your call as soon as possible. Messages left after 4 pm will be answered the following business day.   You may also send [place] a message via [place]. We typically respond to MyChart messages within 1-2 business days.  For prescription refills, please ask your pharmacy to contact our office. Our fax number is [phone].  If you have an urgent issue when the clinic is closed that cannot wait until the next business day, you can page your doctor at the number below.    Please note that while we do our best to be available for urgent issues outside of office hours, we are not available 24/7.   If you have an urgent issue and are unable to reach [place], you may choose to seek medical care at your doctor's office, retail clinic, urgent care center, or emergency room.  If you have a medical emergency, please immediately call 911 or go to the  emergency department.  Pager Numbers  - Dr. Nehemiah Massed: [phone]  - Dr. Laurence Ferrari: [phone]  - Dr. Nicole Kindred: [phone]  In the event of inclement weather, please call our main line at [phone] for an update on the status of any delays or closures.  Dermatology Medication Tips: Please keep the boxes that topical medications come in in order to help keep track of the instructions about where and how to use these. Pharmacies typically print the medication instructions only on the boxes and not directly on the medication tubes.   If your medication is too expensive, please contact our office at [phone] option 4 or send [place] a message through [place].   We are unable to tell what your co-pay for medications will be in advance as this is different depending on your insurance coverage. However, we may be able to find a substitute medication at lower cost or fill out paperwork to get insurance to cover a needed medication.   If a prior authorization is required to get your medication covered by your insurance company, please allow [place] 1-2 business days to complete this process.  Drug prices often vary depending on where the prescription is filled and some pharmacies may offer cheaper prices.  The website www.goodrx.com contains coupons for medications through different pharmacies. The prices here do not account for what the cost may be with help from insurance (it may be cheaper with your insurance), but the website can  give you the price if you did not use any insurance.  - You can print the associated coupon and take it with your prescription to the pharmacy.  - You may also stop by our office during regular business hours and pick up a GoodRx coupon card.  - If you need your prescription sent electronically to a different pharmacy, notify our office through [facility] or by phone at [phone] option 4.     Si Usted Necesita Algo Despus de Su Visita  Tambin puede  enviarnos un mensaje a travs de Pharmacist, community. Por lo general respondemos a los mensajes de MyChart en el transcurso de 1 a 2 das hbiles.  Para renovar recetas, por favor pida a su farmacia que se ponga en contacto con nuestra oficina. Harland Dingwall de fax es [place] [phone].  Si tiene un asunto urgente cuando la clnica est cerrada y que no puede esperar hasta el siguiente da hbil, puede llamar/localizar a su doctor(a) al nmero que aparece a continuacin.   Por favor, tenga en cuenta que aunque hacemos todo lo posible para estar disponibles para asuntos urgentes fuera del horario de [place], no estamos disponibles las 24 horas del da, los 7 das de la [place].   Si tiene un problema urgente y no puede comunicarse con nosotros, puede optar por buscar atencin mdica  en el consultorio de su doctor(a), en una clnica privada, en un centro de atencin urgente o en una sala de emergencias.  Si tiene Engineering geologist, por favor llame inmediatamente al 911 o vaya a la sala de emergencias.  Nmeros de bper  - Dr. Nehemiah Massed: [phone]  - Dra. Moye: [phone]  - Dra. Nicole Kindred: [phone]  En caso de inclemencias del [place], por favor llame a Johnsie Kindred principal al [phone] para una actualizacin sobre el [place] de cualquier retraso o cierre.  Consejos para la medicacin en dermatologa: Por favor, guarde las cajas en las que vienen los medicamentos de uso tpico para ayudarle a seguir las instrucciones sobre dnde y cmo usarlos. Las farmacias generalmente imprimen las instrucciones del medicamento slo en las cajas y no directamente en los tubos del [place].   Si su medicamento es muy caro, por favor, pngase en contacto con Zigmund Daniel llamando al [phone] y presione la opcin 4 o envenos un mensaje a travs de Pharmacist, community.   No podemos decirle cul ser su copago por los medicamentos por adelantado ya que esto es diferente dependiendo de la cobertura de su seguro.  Sin embargo, es posible que podamos encontrar un medicamento sustituto a Electrical engineer un formulario para que el seguro cubra el medicamento que se considera necesario.   Si se requiere una autorizacin previa para que su compaa de seguros [place] su medicamento, por favor permtanos de 1 a 2 das hbiles para completar este proceso.  Los precios de los medicamentos varan con frecuencia dependiendo del Environmental consultant de dnde se surte la receta y alguna farmacias pueden ofrecer precios ms baratos.  El sitio web www.goodrx.com tiene cupones para medicamentos de Airline pilot. Los precios aqu no tienen en cuenta lo que podra costar con la ayuda del seguro (puede ser ms barato con su seguro), pero el sitio web puede darle el precio si no utiliz Research scientist (physical sciences).  - Puede imprimir el cupn correspondiente y llevarlo con su receta a la farmacia.  - Tambin puede pasar por nuestra oficina durante el horario de atencin regular y Charity fundraiser una tarjeta de cupones de GoodRx.  -  Si necesita que su receta se enve electrnicamente a Chiropodist, informe a nuestra oficina a travs de MyChart de [place] o por telfono llamando al [phone] y presione la opcin 4.

## 2022-03-23 NOTE — Progress Notes (Unsigned)
   Follow-Up Visit   Subjective  Kristy [name] is a 71 y.o. female who presents for the following: ISKs to treat (Neck, left hand, txted R hand last visit).   The following portions of the chart were reviewed this encounter and updated as appropriate:       Review of Systems:  No other skin or systemic complaints except as noted in HPI or Assessment and Plan.  Objective  Well appearing patient in no apparent distress; mood and affect are within normal limits.  A focused examination was performed including face, neck, hands. Relevant physical exam findings are noted in the Assessment and Plan.  R top of shoulder, R neck x 17, R dorsum hand x 6 (23) Stuck on waxy paps with erythema    Assessment & Plan   Seborrheic Keratoses - Stuck-on, waxy, tan-brown papules and/or plaques  - Benign-appearing - Discussed benign etiology and prognosis. - Observe - Call for any changes  Inflamed seborrheic keratosis (23) R top of shoulder, R neck x 17, R dorsum hand x 6  Symptomatic, irritating, patient would like treated.   Destruction of lesion - R top of shoulder, R neck x 17, R dorsum hand x 6 Complexity: simple   Destruction method: cryotherapy   Informed consent: discussed and consent obtained   Timeout:  patient name, date of birth, surgical site, and procedure verified Lesion destroyed using liquid nitrogen: Yes   Region frozen until ice ball extended beyond lesion: Yes   Outcome: patient tolerated procedure well with no complications   Post-procedure details: wound care instructions given     Return for 2 months for ISKs.  I, Othelia Pulling, RMA, am acting as scribe for Sarina Ser, MD .

## 2022-03-24 ENCOUNTER — Encounter: Payer: Self-pay | Admitting: Dermatology

## 2022-03-29 DIAGNOSIS — H6123 Impacted cerumen, bilateral: Secondary | ICD-10-CM | POA: Diagnosis not present

## 2022-03-29 DIAGNOSIS — H903 Sensorineural hearing loss, bilateral: Secondary | ICD-10-CM | POA: Diagnosis not present

## 2022-04-03 ENCOUNTER — Telehealth: Payer: Self-pay | Admitting: Family Medicine

## 2022-04-03 NOTE — Telephone Encounter (Signed)
Spoke with [name].  She states she put in a new Freestyle Libre sensor on 03/30/22 and it bled for like 7 minutes.  She uses band-aids to try on hold the senor in place because she has a hard time with the sensors staying in her arm.  Since it was bleeding she decided to pull the band-aid off which in turn pulled the sensor out of her arm.  She is concerned because this time the fiber that goes into the arm was not attached to the sensor, ike it normally is,  so she is thinking the fiber is still in her arm.  She is not having any swelling or redness at the site but when she spoke with the [place] rep on the phone to get her sensor replaced they told her she needed to contact her doctor.  She is asking if she should be concerned or do we think the fiber will just work it's way out of her arm.  Please advise.

## 2022-04-03 NOTE — Telephone Encounter (Signed)
Patient had to replace [place] 2 sensor for diabetes 03/30/22, and she said her arm dripped blood for 7 minutes which is not usual. She said she thinks there is a fiber in her arm. She would like a call back from [place]. Call back is [phone]. She said it seems fine but she just had a few questions

## 2022-04-04 NOTE — Telephone Encounter (Signed)
I do not have any idea, but she is concerned, then someone should evaluate her in the office this week.

## 2022-04-06 NOTE — Progress Notes (Signed)
Patient has been switched from telephone appointment with [name] to in-office appointment. Dr. Edilia Bo would like for patient to have Freestyle Libre Training at the appointment. Patient has been notified of the change.   [name], CPP notified  [name], [place] Clinical Pharmacy Assistant [phone]

## 2022-04-06 NOTE — Telephone Encounter (Signed)
Ms. Mullins notified as instructed by telephone.  She will continue to monitor her arm for any signs of swelling/pain/redness or hot to touch and let us know.  She is scheduled for a phone visit with pharmacy next Tuesday.  I have sent a message to Ria Comment to see if we can change to in person visit so she can help patient with sensor insertion because she is having issues with the sensors staying in her arm.

## 2022-04-13 ENCOUNTER — Telehealth: Payer: Self-pay

## 2022-04-13 ENCOUNTER — Ambulatory Visit: Payer: Self-pay | Admitting: Dermatology

## 2022-04-13 DIAGNOSIS — E113493 Type 2 diabetes mellitus with severe nonproliferative diabetic retinopathy without macular edema, bilateral: Secondary | ICD-10-CM | POA: Diagnosis not present

## 2022-04-13 NOTE — Progress Notes (Signed)
    Chronic Care Management Pharmacy Assistant   Name: Kristy [name]  MRN: [id number] DOB: 22-Nov-1950  Reason for Encounter: CCM (Appointment Reminder)  Medications: Outpatient Encounter Medications as of 04/13/2022  Medication Sig   aspirin EC 81 MG EC tablet Take 1 tablet (81 mg total) by mouth daily.   BD INSULIN SYRINGE U/F 31G X 5/16" 0.5 ML MISC USE AS DIRECTED   carvedilol (COREG) 12.5 MG tablet TAKE 1 TABLET BY MOUTH TWICE DAILY   chlorhexidine (PERIDEX) 0.12 % solution RINSE WITH 1 CAPFUL (15ML) FOR 30 SECONDS IN THE MORNING AND IN THE EVENING AFTER TOOTHBRUSHING DO NOT SWALLOW   Ensure (ENSURE) Take 237 mLs by mouth daily.   ENTRESTO 49-51 MG TAKE 1 TABLET BY MOUTH TWICE DAILY   insulin detemir (LEVEMIR) 100 UNIT/ML injection Inject 0.25 mLs (25 Units total) into the skin 2 (two) times daily.   insulin regular human CONCENTRATED (HUMULIN R) 500 UNIT/ML injection Inject 0.13 mLs (65 Units total) into the skin 3 (three) times daily with meals. Pt is using U-100 needles - dose is 13 units TID as of 09/30/21   ivabradine (CORLANOR) 5 MG TABS tablet Take 5 mg by mouth daily as needed.   ONETOUCH ULTRA test strip USE TO CHECK BLOOD SUGAR UP TO 8 TIMES DAILY   Probiotic Product (ALIGN PO) Take 1 tablet by mouth daily.   spironolactone (ALDACTONE) 25 MG tablet TAKE 1 TABLET BY MOUTH DAILY   No facility-administered encounter medications on file as of 04/13/2022.   [name] was contacted to remind of upcoming office visit with [name]  on 04/18/2022 at 3:45. Patient was reminded to have any blood glucose and blood pressure readings available for review at appointment.   Message was left reminding patient of appointment.  CCM referral has been placed prior to visit?  No   Star Rating Drugs: Medication:  Last Fill: Day Supply No star rating drugs noted  [name], CPP notified  [name], [place] Pharmacy Assistant [phone]

## 2022-04-16 DIAGNOSIS — Z794 Long term (current) use of insulin: Secondary | ICD-10-CM | POA: Diagnosis not present

## 2022-04-16 DIAGNOSIS — E1165 Type 2 diabetes mellitus with hyperglycemia: Secondary | ICD-10-CM | POA: Diagnosis not present

## 2022-04-18 ENCOUNTER — Ambulatory Visit: Payer: Medicare HMO

## 2022-04-25 NOTE — Progress Notes (Signed)
Called patient to reschedule her in-office glucose meter training with [name]. Patient has been rescheduled for 05/08/2022 for 11:15.  [name], CPP notified  Kristy [name], [place] Clinical Pharmacy Assistant [phone]

## 2022-05-03 ENCOUNTER — Telehealth: Payer: Self-pay

## 2022-05-03 NOTE — Progress Notes (Signed)
    Chronic Care Management Pharmacy Assistant   Name: Kristy [name]  MRN: [id number] DOB: 1951/02/28  Reason for Encounter: CCM (Appointment Reminder)  Medications: Outpatient Encounter Medications as of 05/03/2022  Medication Sig   aspirin EC 81 MG EC tablet Take 1 tablet (81 mg total) by mouth daily.   BD INSULIN SYRINGE U/F 31G X 5/16" 0.5 ML MISC USE AS DIRECTED   carvedilol (COREG) 12.5 MG tablet TAKE 1 TABLET BY MOUTH TWICE DAILY   chlorhexidine (PERIDEX) 0.12 % solution RINSE WITH 1 CAPFUL (15ML) FOR 30 SECONDS IN THE MORNING AND IN THE EVENING AFTER TOOTHBRUSHING DO NOT SWALLOW   Ensure (ENSURE) Take 237 mLs by mouth daily.   ENTRESTO 49-51 MG TAKE 1 TABLET BY MOUTH TWICE DAILY   insulin detemir (LEVEMIR) 100 UNIT/ML injection Inject 0.25 mLs (25 Units total) into the skin 2 (two) times daily.   insulin regular human CONCENTRATED (HUMULIN R) 500 UNIT/ML injection Inject 0.13 mLs (65 Units total) into the skin 3 (three) times daily with meals. Pt is using U-100 needles - dose is 13 units TID as of 09/30/21   ivabradine (CORLANOR) 5 MG TABS tablet Take 5 mg by mouth daily as needed.   ONETOUCH ULTRA test strip USE TO CHECK BLOOD SUGAR UP TO 8 TIMES DAILY   Probiotic Product (ALIGN PO) Take 1 tablet by mouth daily.   spironolactone (ALDACTONE) 25 MG tablet TAKE 1 TABLET BY MOUTH DAILY   No facility-administered encounter medications on file as of 05/03/2022.   [name] was contacted to remind of upcoming office visit with [name] on 05/08/22 at 11:15. Patient was reminded to have any blood glucose and blood pressure readings available for review at appointment.   Message was left reminding patient of appointment.  CCM referral has been placed prior to visit?  No   Star Rating Drugs: Medication:  Last Fill: Day Supply No star rating drugs noted  [name], CPP notified  [name], [place] Pharmacy Assistant [phone]

## 2022-05-08 ENCOUNTER — Ambulatory Visit: Payer: Medicare HMO | Admitting: Pharmacist

## 2022-05-08 DIAGNOSIS — E1140 Type 2 diabetes mellitus with diabetic neuropathy, unspecified: Secondary | ICD-10-CM

## 2022-05-08 DIAGNOSIS — I10 Essential (primary) hypertension: Secondary | ICD-10-CM

## 2022-05-08 NOTE — Patient Instructions (Addendum)
Visit Information  Endocrinology Referral:  [facility] - [facility] [address] [place], [place], [place] [address] [phone]  Phone number for Pharmacist: [phone]   Goals Addressed   None     Care Plan : [place]  Updates made by Charlton Haws, [facility] since 05/10/2022 12:00 AM     Problem: Hypertension, Hyperlipidemia, Diabetes, Heart Failure, and Coronary Artery Disease   Priority: High     Long-Range Goal: Disease mgmt   Start Date: 12/15/2021  Expected End Date: 12/16/2022  This Visit's Progress: On track  Recent Progress: On track  Priority: High  Note:   Current Barriers:  Unable to achieve control of diabetes and cholesterol  Pharmacist Clinical Goal(s):  Patient will contact provider office for questions/concerns as evidenced notation of same in electronic health record through collaboration with PharmD and provider.   Interventions: 1:1 collaboration with Owens Loffler, MD regarding development and update of comprehensive plan of care as evidenced by provider attestation and co-signature Inter-disciplinary care team collaboration (see longitudinal plan of care) Comprehensive medication review performed; medication list updated in electronic medical record  Hyperlipidemia/CAD: (LDL goal < 70) -Uncontrolled - LDL 187 (11/2021). LDL previously <130 in 2011, jumped to 195 in 2015. She has not been able to tolerate any cholesterol medication. -Hx CAD (single-vessel disease, medical mgmt); hx stroke x2 per patient -Current treatment: None -Medications previously tried: atorvastatin 80, rosuvastatin 5-10, pravastatin 10, Zetia, Repatha - felt awful for 14 days -Educated on Cholesterol goals; majority of cholesterol made endogenously and most medications target this; discussed Praluent, Nexletol and Leqvio - last remaining options for LDL lowering; she would be a candidate for Leqvio but would need to discuss with  cardiology/lipid clinic  Hypertension / Heart Failure (BP goal <130/80) -Controlled - per clinic readings within goal; pt reports only a certain brand of spironolactone works for her, she notes 15 lb wt gain this year after spironolactone mfr was changed at her pharmacy, she attributed wt gain to inefficacy of new pill, she denies changes in BP; Discussed insulin change around the same time is much more likely to cause that degree of wt gain over a more prolonged period -Pt will need refill on spironolactone at end of June. Not clear if she needs cardiology appt prior to that.  -HF type: Combined systolic and diastolic -LVEF: 43-32% (9/51/88) - improved, previously < 40% -Current home readings: 127/74 -Current treatment: Carvedilol 12.5 mg BID - Appropriate, Effective, Safe, Accessible Spironolactone 25 mg daily -Appropriate, Effective, Safe, Accessible Entresto 49-51 mg BID -Appropriate, Effective, Safe, Accessible Ivabradine 5 mg daily PRN -Appropriate, Effective, Safe, Accessible -Medications previously tried: none  -Counseled to monitor BP at home at least once monthly, document, and provide log at future appointments -Recommended to continue current medication; advised pt to contact cardiology for appt  Diabetes (A1c goal <8%) -Uncontrolled, improving - A1c 9.4% (11/2021), improved from 11.2% after receiving Humulin R U-500 in vials instead of pens, for some reason the Vial insulin is working better -Current home glucose readings - via FL2. Using reader device. Average glucose 219, TIR 30%, 1% low.  -DM history: Diagnosed with type 2 diabetes 1998, transitioned to insulin 2005.  Multiple episodes of DKA. Pt has seen multiple endocrinologist and frequently lost to follow up. She has been referred to Endocrine [facility] on [place]) and pt has not heard from them yet. Advised pt to call their office for appt. -Current medications: Levemir 25 units BID -  Query appropriate Humulin R U-500 - 65  units (13u w/ U-100 syringe) TID w/ meals (PAP)- Appropriate, Query Effective Freestyle Libre 2 (Advanced Diabetes supply) - Appropriate, Effective, Safe, Accessible -Medications previously tried: Lantus (pt reports ineffective); rosiglitazone (HF-pt insists this is the reason she developed HF), glimepiride (hypoglycemia); metformin (unknown); Invokana (increased thirst); Bydureon (cost/difficult administration) -Previously discussed  it is typically inappropriate to take another type of insulin with Humulin R U-500; pt insists Levemir has helped "even out" her sugars and prevent low sugars -Previously discussed possibility of starting Trulicity (already approved for [employer] so cost would not be an issue), pt does not want to start new medication now but will think about it for future -Revewed [employer] - pt has trouble with it falling off early or knocking it off accidentally; reviewed preferred placement of device is on the arm, however it has been used off-label on the abdomen, suggested trial alternate site on upper abdomen  Patient Goals/Self-Care Activities Patient will:  - take medications as prescribed - check glucose daily before meals, 2 hours after meals, and bedtime, document, and provide at future appointments - check blood pressure daily, document, and provide at future appointments      Patient verbalizes understanding of instructions and care plan provided today and agrees to view in [place]. Active MyChart status and patient understanding of how to access instructions and care plan via MyChart confirmed with patient.    Telephone follow up appointment with pharmacy team member scheduled for: 2 months  [name], PharmD, [facility] Clinical Pharmacist [facility] [phone]

## 2022-05-08 NOTE — Progress Notes (Unsigned)
Chronic Care Management Pharmacy Note  05/10/2022 Name:  Kristy [name] MRN:  [id number] DOB:  August 27, 1951  Summary: CCM F/U visit -Freestyle Libre: Average glucose 14 days 219; Time-in-range 30%, Low 1%; -Pt has trouble with Freestyle Libre falling off her arm while undressing, hitting it on doorframes, etc. She has trouble placing overlay patch due to placement on back of the arm.  -Pt was referred to [facility]) in May and reports she has not heard from them. Advised pt to call their office for appt.  Recommendations/Changes made from today's visit: -Advised trial of alternative placement site for [facility] - upper abdomen. Discussed this is not the preferred FDA-approved placement site and may not be as accurate.  Plan: -Pharmacist follow up televisit scheduled for 2 months -PCP f/u 06/07/22 (6 month)    Subjective: Kristy [name] is an 71 y.o. year old female who is a primary patient of Copland, [name], MD.  The CCM team was consulted for assistance with disease management and care coordination needs.    Engaged with patient by telephone for follow up visit in response to provider referral for pharmacy case management and/or care coordination services.   Consent to Services:  The patient was given information about Chronic Care Management services, agreed to services, and gave verbal consent prior to initiation of services.  Please see initial visit note for detailed documentation.   Patient Care Team: Owens Loffler, MD as PCP - General (Family Medicine) Rockey Situ Kathlene November, MD as PCP - Cardiology (Cardiology) Minna Merritts, MD as Consulting Physician (Cardiology) Charlton Haws, [facility] as Pharmacist (Pharmacist)   Patient lives at home alone. She is estranged from family but spends time with boyfriend (66 yo, with ASD).    Recent office visits: 12/05/21 Dr Lorelei Pont OV: AWV. A1c improved to 9.4% using U-500 vials rather than Kwikpen.   04/13/21 - Dr.  Owens Loffler, PCP - Pt presented for diabetes follow up. Continue current medications. Follow up 3 months.  Recent consult visits: 03/01/22 PA Christell Faith (Cardiology): f/u - pursue ECHO - WNL. No med changes.  05/03/21 - Cardiology, Dr Rockey Situ - Pt presented for CHF. Start Repatha 140 mg every other week [place].  Hospital visits: None in previous 6 months  Objective:  Lab Results  Component Value Date   CREATININE 1.07 12/05/2021   BUN 19 12/05/2021   GFR 52.63 (L) 12/05/2021   GFRNONAA 70 04/08/2020   GFRAA 80 04/08/2020   NA 141 12/05/2021   K 4.3 12/05/2021   CALCIUM 10.5 12/05/2021   CO2 28 12/05/2021   GLUCOSE 169 (H) 12/05/2021    Lab Results  Component Value Date/Time   HGBA1C 9.4 (H) 12/05/2021 10:35 AM   HGBA1C 11.8 (A) 04/13/2021 02:52 PM   HGBA1C 11.7 (A) 10/04/2020 02:43 PM   HGBA1C 9.7 (H) 12/08/2019 03:00 PM   HGBA1C >14.0 12/02/2018 12:00 AM   GFR 52.63 (L) 12/05/2021 10:35 AM   GFR 55.55 (L) 10/04/2020 04:19 PM   MICROALBUR 18.1 (H) 12/10/2018 10:08 AM   MICROALBUR 36.0 (H) 01/10/2017 03:15 PM    Last diabetic Eye exam:  Lab Results  Component Value Date/Time   HMDIABEYEEXA Retinopathy (A) 04/11/2021 12:00 AM    Last diabetic Foot exam:  up to date  Lab Results  Component Value Date   CHOL 261 (H) 12/05/2021   HDL 34.30 (L) 12/05/2021   LDLCALC 103 (H) 04/22/2019   LDLDIRECT 187.0 12/05/2021   TRIG 205.0 (H) 12/05/2021  CHOLHDL 8 12/05/2021       Latest Ref Rng & Units 12/05/2021   10:35 AM 10/04/2020    4:19 PM 12/08/2019    3:00 PM  Hepatic Function  Total Protein 6.0 - 8.3 g/dL 7.1  7.3  6.8   Albumin 3.5 - 5.2 g/dL 4.2  4.5  3.9   AST 0 - 37 U/L _0 ALT 0 - 35 U/L _1 Alk Phosphatase 39 - 117 U/L 69  81  103   Total Bilirubin 0.2 - 1.2 mg/dL 0.6  0.5  0.4   Bilirubin, Direct 0.0 - 0.3 mg/dL 0.1  0.1  0.1    Lab Results  Component Value Date/Time   TSH 1.325 08/07/2019 01:21 PM   TSH 0.586 04/19/2019 06:54 PM   TSH  3.88 07/11/2018 08:46 AM   TSH 1.13 01/10/2017 03:15 PM   FREET4 1.04 04/19/2019 06:54 PM   FREET4 0.98 09/07/2014 02:43 PM       Latest Ref Rng & Units 12/05/2021   10:35 AM 10/04/2020    4:19 PM 12/08/2019    3:00 PM  CBC  WBC 4.0 - 10.5 K/uL 9.5  9.6  10.9   Hemoglobin 12.0 - 15.0 g/dL 13.6  14.3  13.0   Hematocrit 36.0 - 46.0 % 41.6  43.3  40.4   Platelets 150.0 - 400.0 K/uL 167.0  181.0  196.0    Clinical ASCVD: Yes  The ASCVD Risk score (Arnett DK, et al., 2019) failed to calculate for the following reasons:   The patient has a prior MI or stroke diagnosis       12/05/2021   10:25 AM 07/31/2019   12:20 PM 02/12/2019   11:13 AM  Depression screen PHQ 2/9  Decreased Interest 0 0 3  Down, Depressed, Hopeless 0 2 0  PHQ - 2 Score 0 2 3  Altered sleeping  1 0  Tired, decreased energy  1 3  Change in appetite  1 3  Feeling bad or failure about yourself   0 0  Trouble concentrating  0 0  Moving slowly or fidgety/restless  0 2  Suicidal thoughts  0 0  PHQ-9 Score  5 11  Difficult doing work/chores   Not difficult at all    Social History   Tobacco Use  Smoking Status Former   Packs/day: 0.75   Years: 30.00   Total pack years: 22.50   Types: Cigarettes   Quit date: 06/06/1997   Years since quitting: 24.9  Smokeless Tobacco Never   BP Readings from Last 3 Encounters:  03/01/22 (!) 144/72  12/05/21 130/64  07/26/21 127/66   Pulse Readings from Last 3 Encounters:  03/01/22 85  12/05/21 89  07/26/21 80   Wt Readings from Last 3 Encounters:  03/01/22 172 lb 4 oz (78.1 kg)  12/05/21 174 lb 4 oz (79 kg)  07/26/21 166 lb (75.3 kg)   BMI Readings from Last 3 Encounters:  03/01/22 30.51 kg/m  12/05/21 30.38 kg/m  07/26/21 29.41 kg/m    Assessment/Interventions: Review of patient past medical history, allergies, medications, health status, including review of consultants reports, laboratory and other test data, was performed as part of comprehensive evaluation and  provision of chronic care management services.   SDOH:  (Social Determinants of Health) assessments and interventions performed: Yes     SDOH Screenings   Alcohol Screen: Not on file  Depression (GYJ8-5): Low Risk  (  12/05/2021)   Depression (PHQ2-9)    PHQ-2 Score: 0  Financial Resource Strain: Low Risk  (02/15/2022)   Overall Financial Resource Strain (CARDIA)    Difficulty of Paying Living Expenses: Not very hard  Food Insecurity: No Food Insecurity (02/15/2022)   Hunger Vital Sign    Worried About Running Out of Food in the Last Year: Never true    Ran Out of Food in the Last Year: Never true  Housing: Not on file  Physical Activity: Not on file  Social Connections: Not on file  Stress: Not on file  Tobacco Use: Medium Risk (03/24/2022)   Patient History    Smoking Tobacco Use: Former    Smokeless Tobacco Use: Never    Passive Exposure: Not on file  Transportation Needs: Not on file    [place]  Allergies  Allergen Reactions   Fish Oil Other (See Comments)    Gout   Glimepiride Other (See Comments)    REACTION: hypoglycemia   Guanfacine Hcl Other (See Comments)    REACTION: unspecified   Other     Splenda caused sugars levels to increase   Rosiglitazone Other (See Comments)    CHF   Shellfish Allergy     Gout    Medications Reviewed Today     Reviewed by Ralene Bathe, MD (Physician) on 03/24/22 at [place] List Status: <None>   Medication Order Taking? Sig Documenting Provider Last Dose Status Informant  aspirin EC 81 MG EC tablet [id number] No Take 1 tablet (81 mg total) by mouth daily. Hillary Bow, MD Taking Active Other  BD INSULIN SYRINGE U/F 31G X 5/16" 0.5 ML MISC [id number] No USE AS DIRECTED Copland, Spencer, MD Taking Active   carvedilol (COREG) 12.5 MG tablet [id number] No TAKE 1 TABLET BY MOUTH TWICE DAILY Gollan, Kathlene November, MD Taking Active   chlorhexidine (PERIDEX) 0.12 % solution [id number] No RINSE WITH 1 CAPFUL (15ML) FOR 30 SECONDS IN  THE MORNING AND IN THE EVENING AFTER TOOTHBRUSHING DO NOT SWALLOW [provider] Taking Active   Ensure (ENSURE) [id number] No Take 237 mLs by mouth daily. [provider] Taking Active Other  ENTRESTO 49-51 MG [id number] No TAKE 1 TABLET BY MOUTH TWICE DAILY Gollan, Kathlene November, MD Taking Active   insulin detemir (LEVEMIR) 100 UNIT/ML injection [id number] No Inject 0.25 mLs (25 Units total) into the skin 2 (two) times daily. Copland, [name], MD Taking Active   insulin regular human CONCENTRATED (HUMULIN R) 500 UNIT/ML injection [id number] No Inject 0.13 mLs (65 Units total) into the skin 3 (three) times daily with meals. Pt is using U-100 needles - dose is 13 units TID as of 09/30/21 Copland, [name], MD Taking Active   ivabradine (CORLANOR) 5 MG TABS tablet [id number] No Take 5 mg by mouth daily as needed. [provider] Taking Active   ONETOUCH ULTRA test strip [id number] No USE TO CHECK BLOOD SUGAR UP TO 8 TIMES DAILY Copland, Spencer, MD Taking Active   Probiotic Product (ALIGN PO) [id number] No Take 1 tablet by mouth daily. [provider] Taking Active   spironolactone (ALDACTONE) 25 MG tablet [id number] No TAKE 1 TABLET BY MOUTH DAILY Gollan, Kathlene November, MD Taking Active             Patient Active Problem List   Diagnosis Date Noted   Statin intolerance 12/06/2021   Advanced care planning/counseling discussion: DNR / DNI 10/04/2020   Patient has active power of attorney for health care: Sherren Mocha  [place] 10/04/2020   CKD stage 2 due to type 2 diabetes mellitus ([place]) 10/04/2020   NICM (nonischemic cardiomyopathy) ([place]) 32/44/0102   Chronic systolic CHF (congestive heart failure) ([place]) 08/22/2018   Coronary artery disease, non-occlusive 09/13/2016   Crohn's disease ([place]) 10/13/2013   Major depressive disorder, recurrent, in full remission ([place]) 05/01/2011   PSORIASIS 08/02/2009   Hyperlipidemia associated with type 2 diabetes mellitus ([place]) 05/11/2009   GOUT  02/05/2009   Uncontrolled type 2 diabetes mellitus with stage 3 chronic kidney disease, with long-term current use of insulin    Essential hypertension 04/18/2007   LBBB (left bundle branch block) 04/18/2007   ALLERGIC RHINITIS 04/18/2007    Immunization History  Administered Date(s) Administered   PPD Test 08/29/2019   Td 10/02/1993, 01/06/2008    Conditions to be addressed/monitored:  Hypertension, Hyperlipidemia, Diabetes, Heart Failure, and Coronary Artery Disease   Care Plan : [place]  Updates made by Charlton Haws, [place] since 05/10/2022 12:00 AM     Problem: Hypertension, Hyperlipidemia, Diabetes, Heart Failure, and Coronary Artery Disease   Priority: High     Long-Range Goal: Disease mgmt   Start Date: 12/15/2021  Expected End Date: 12/16/2022  This Visit's Progress: On track  Recent Progress: On track  Priority: High  Note:   Current Barriers:  Unable to achieve control of diabetes and cholesterol  Pharmacist Clinical Goal(s):  Patient will contact provider office for questions/concerns as evidenced notation of same in electronic health record through collaboration with PharmD and provider.   Interventions: 1:1 collaboration with Owens Loffler, MD regarding development and update of comprehensive plan of care as evidenced by provider attestation and co-signature Inter-disciplinary care team collaboration (see longitudinal plan of care) Comprehensive medication review performed; medication list updated in electronic medical record  Hyperlipidemia/CAD: (LDL goal < 70) -Uncontrolled - LDL 187 (11/2021). LDL previously <130 in 2011, jumped to 195 in 2015. She has not been able to tolerate any cholesterol medication. -Hx CAD (single-vessel disease, medical mgmt); hx stroke x2 per patient -Current treatment: None -Medications previously tried: atorvastatin 80, rosuvastatin 5-10, pravastatin 10, Zetia, Repatha - felt awful for 14 days -Educated on  Cholesterol goals; majority of cholesterol made endogenously and most medications target this; discussed Praluent, Nexletol and Leqvio - last remaining options for LDL lowering; she would be a candidate for Leqvio but would need to discuss with cardiology/lipid clinic  Hypertension / Heart Failure (BP goal <130/80) -Controlled - per clinic readings within goal; pt reports only a certain brand of spironolactone works for her, she notes 15 lb wt gain this year after spironolactone mfr was changed at her pharmacy, she attributed wt gain to inefficacy of new pill, she denies changes in BP; Discussed insulin change around the same time is much more likely to cause that degree of wt gain over a more prolonged period -Pt will need refill on spironolactone at end of June. Not clear if she needs cardiology appt prior to that.  -HF type: Combined systolic and diastolic -LVEF: 72-53% (6/64/40) - improved, previously < 40% -Current home readings: 127/74 -Current treatment: Carvedilol 12.5 mg BID - Appropriate, Effective, Safe, Accessible Spironolactone 25 mg daily -Appropriate, Effective, Safe, Accessible Entresto 49-51 mg BID -Appropriate, Effective, Safe, Accessible Ivabradine 5 mg daily PRN -Appropriate, Effective, Safe, Accessible -Medications previously tried: none  -Counseled to monitor BP at home at least once monthly, document, and provide log at future appointments -Recommended to continue current medication; advised pt to contact cardiology for appt  Diabetes (A1c goal <8%) -Uncontrolled, improving - A1c 9.4% (11/2021), improved from 11.2% after receiving Humulin R U-500 in vials instead of pens, for some reason the Vial insulin is working better -Current home glucose readings - via FL2. Using reader device. Average glucose 219, TIR 30%, 1% low.  -DM history: Diagnosed with type 2 diabetes 1998, transitioned to insulin 2005.  Multiple episodes of DKA. Pt has seen multiple endocrinologist and  frequently lost to follow up. She has been referred to [facility] on [address]) and pt has not heard from them yet. Advised pt to call their office for appt. -Current medications: Levemir 25 units BID -  Query appropriate Humulin R U-500 - 65 units (13u w/ U-100 syringe) TID w/ meals (PAP)- Appropriate, Query Effective Freestyle Libre 2 ([facility] supply) - Appropriate, Effective, Safe, Accessible -Medications previously tried: Lantus (pt reports ineffective); rosiglitazone (HF-pt insists this is the reason she developed HF), glimepiride (hypoglycemia); metformin (unknown); Invokana (increased thirst); Bydureon (cost/difficult administration) -Previously discussed  it is typically inappropriate to take another type of insulin with Humulin R U-500; pt insists Levemir has helped "even out" her sugars and prevent low sugars -Previously discussed possibility of starting Trulicity (already approved for [employer] so cost would not be an issue), pt does not want to start new medication now but will think about it for future -Revewed [employer] - pt has trouble with it falling off early or knocking it off accidentally; reviewed preferred placement of device is on the arm, however it has been used off-label on the abdomen, suggested trial alternate site on upper abdomen  Patient Goals/Self-Care Activities Patient will:  - take medications as prescribed - check glucose daily before meals, 2 hours after meals, and bedtime, document, and provide at future appointments - check blood pressure daily, document, and provide at future appointments     Medication Assistance:  [employer] - Humulin R U-500 (approved through 10/01/2022)  Compliance/Adherence/Medication fill history: Care Gaps: DEXA scan  Star-Rating Drugs: None  Medication Access: Within the past 30 days, how often has patient missed a dose of medication? 0 Is a pillbox or other method used to improve adherence? Yes   Factors that may affect medication adherence? adverse effects of medications Are meds synced by current pharmacy? No  Are meds delivered by current pharmacy? No  Does patient experience delays in picking up medications due to transportation concerns? No   [facility] Services Reviewed: Is patient disadvantaged to use [facility]?: No  Current Rx insurance plan: [employer] [place] Name and location of Current pharmacy:  [place], [place] [address] [place] [address] Phone: [phone] Fax: [phone]  [facility] services reviewed with patient today?: No  Patient requests to transfer care to [facility]?: No  Reason patient declined to change pharmacies: Loyalty to other pharmacy/Patient preference   Care Plan and Follow Up Patient Decision:  Patient agrees to Care Plan and Follow-up.  Follow Up Plan: Telephone follow up appointment with care management team member scheduled for: 2 months  [name], PharmD, [facility] Clinical Pharmacist [facility] [phone]

## 2022-05-12 DIAGNOSIS — E113493 Type 2 diabetes mellitus with severe nonproliferative diabetic retinopathy without macular edema, bilateral: Secondary | ICD-10-CM | POA: Diagnosis not present

## 2022-05-15 ENCOUNTER — Other Ambulatory Visit: Payer: Self-pay | Admitting: Cardiovascular Disease

## 2022-05-17 DIAGNOSIS — E1165 Type 2 diabetes mellitus with hyperglycemia: Secondary | ICD-10-CM | POA: Diagnosis not present

## 2022-05-17 DIAGNOSIS — Z794 Long term (current) use of insulin: Secondary | ICD-10-CM | POA: Diagnosis not present

## 2022-05-24 ENCOUNTER — Ambulatory Visit: Payer: Medicare HMO | Admitting: Dermatology

## 2022-05-24 DIAGNOSIS — L821 Other seborrheic keratosis: Secondary | ICD-10-CM | POA: Diagnosis not present

## 2022-05-24 DIAGNOSIS — L578 Other skin changes due to chronic exposure to nonionizing radiation: Secondary | ICD-10-CM | POA: Diagnosis not present

## 2022-05-24 DIAGNOSIS — Z85828 Personal history of other malignant neoplasm of skin: Secondary | ICD-10-CM | POA: Diagnosis not present

## 2022-05-24 DIAGNOSIS — L820 Inflamed seborrheic keratosis: Secondary | ICD-10-CM

## 2022-05-24 NOTE — Progress Notes (Signed)
   Follow-Up Visit   Subjective  Kristy [name] is a 71 y.o. female who presents for the following: ISK (Patient is here today to recheck and treat irritated seborrheic keratosis of the face, trunk, and extremities). The patient has spots, moles and lesions to be evaluated, some may be new or changing and the patient has concerns that these could be cancer.  The following portions of the chart were reviewed this encounter and updated as appropriate:   Tobacco  Allergies  Meds  Problems  Med Hx  Surg Hx  Fam Hx     Review of Systems:  No other skin or systemic complaints except as noted in HPI or Assessment and Plan.  Objective  Well appearing patient in no apparent distress; mood and affect are within normal limits.  A focused examination was performed including face, trunk, extemities. Relevant physical exam findings are noted in the Assessment and Plan.  R arm x 30 (30) Erythematous stuck-on, waxy papule or plaque   Assessment & Plan  Inflamed seborrheic keratosis (30) R arm x 30 Symptomatic, irritating, patient would like treated. Destruction of lesion - R arm x 30 Complexity: simple   Destruction method: cryotherapy   Informed consent: discussed and consent obtained   Timeout:  patient name, date of birth, surgical site, and procedure verified Lesion destroyed using liquid nitrogen: Yes   Region frozen until ice ball extended beyond lesion: Yes   Outcome: patient tolerated procedure well with no complications   Post-procedure details: wound care instructions given    Seborrheic Keratoses - Stuck-on, waxy, tan-brown papules and/or plaques  - Benign-appearing - Discussed benign etiology and prognosis. - Observe - Call for any changes  Actinic Damage - chronic, secondary to cumulative UV radiation exposure/sun exposure over time - diffuse scaly erythematous macules with underlying dyspigmentation - Recommend daily broad spectrum sunscreen SPF 30+ to sun-exposed  areas, reapply every 2 hours as needed.  - Recommend staying in the shade or wearing long sleeves, sun glasses (UVA+UVB protection) and wide brim hats (4-inch brim around the entire circumference of the hat). - Call for new or changing lesions.  History of Basal Cell Carcinoma of the Skin - No evidence of recurrence today - Recommend regular full body skin exams - Recommend daily broad spectrum sunscreen SPF 30+ to sun-exposed areas, reapply every 2 hours as needed.  - Call if any new or changing lesions are noted between office visits  Return in about 3 months (around 08/24/2022) for ISK follow up .  Luther Redo, CMA, am acting as scribe for Sarina Ser, MD . Documentation: I have reviewed the above documentation for accuracy and completeness, and I agree with the above.  Sarina Ser, MD

## 2022-05-24 NOTE — Patient Instructions (Signed)
Due to recent changes in healthcare laws, you may see results of your pathology and/or laboratory studies on MyChart before the doctors have had a chance to review them. We understand that in some cases there may be results that are confusing or concerning to you. Please understand that not all results are received at the same time and often the doctors may need to interpret multiple results in order to provide you with the best plan of care or course of treatment. Therefore, we ask that you please give [place] 2 business days to thoroughly review all your results before contacting the office for clarification. Should we see a critical lab result, you will be contacted sooner.   If You Need Anything After Your Visit  If you have any questions or concerns for your doctor, please call our main line at [phone] and press option 4 to reach your doctor's medical assistant. If no one answers, please leave a voicemail as directed and we will return your call as soon as possible. Messages left after 4 pm will be answered the following business day.   You may also send [place] a message via [name]. We typically respond to MyChart messages within 1-2 business days.  For prescription refills, please ask your pharmacy to contact our office. Our fax number is [phone].  If you have an urgent issue when the clinic is closed that cannot wait until the next business day, you can page your doctor at the number below.    Please note that while we do our best to be available for urgent issues outside of office hours, we are not available 24/7.   If you have an urgent issue and are unable to reach [place], you may choose to seek medical care at your doctor's office, retail clinic, urgent care center, or emergency room.  If you have a medical emergency, please immediately call 911 or go to the emergency department.  Pager Numbers  - Dr. Nehemiah Massed: [phone]  - Dr. Laurence Ferrari: [phone]  - Dr. Nicole Kindred:  [phone]  In the event of inclement weather, please call our main line at [phone] for an update on the status of any delays or closures.  Dermatology Medication Tips: Please keep the boxes that topical medications come in in order to help keep track of the instructions about where and how to use these. Pharmacies typically print the medication instructions only on the boxes and not directly on the medication tubes.   If your medication is too expensive, please contact our office at [phone] option 4 or send [place] a message through [place].   We are unable to tell what your co-pay for medications will be in advance as this is different depending on your insurance coverage. However, we may be able to find a substitute medication at lower cost or fill out paperwork to get insurance to cover a needed medication.   If a prior authorization is required to get your medication covered by your insurance company, please allow [place] 1-2 business days to complete this process.  Drug prices often vary depending on where the prescription is filled and some pharmacies may offer cheaper prices.  The website www.goodrx.com contains coupons for medications through different pharmacies. The prices here do not account for what the cost may be with help from insurance (it may be cheaper with your insurance), but the website can give you the price if you did not use any insurance.  - You can print the associated coupon and take it with  your prescription to the pharmacy.  - You may also stop by our office during regular business hours and pick up a GoodRx coupon card.  - If you need your prescription sent electronically to a different pharmacy, notify our office through [facility] or by phone at [phone] option 4.     Si Usted Necesita Algo Despus de Su Visita  Tambin puede enviarnos un mensaje a travs de Pharmacist, community. Por lo general respondemos a los mensajes de MyChart en el transcurso de 1 a 2  das hbiles.  Para renovar recetas, por favor pida a su farmacia que se ponga en contacto con nuestra oficina. Harland Dingwall de fax es [place] [phone].  Si tiene un asunto urgente cuando la clnica est cerrada y que no puede esperar hasta el siguiente da hbil, puede llamar/localizar a su doctor(a) al nmero que aparece a continuacin.   Por favor, tenga en cuenta que aunque hacemos todo lo posible para estar disponibles para asuntos urgentes fuera del horario de [place], no estamos disponibles las 24 horas del da, los 7 das de la [employer].   Si tiene un problema urgente y no puede comunicarse con nosotros, puede optar por buscar atencin mdica  en el consultorio de su doctor(a), en una clnica privada, en un centro de atencin urgente o en una sala de emergencias.  Si tiene Engineering geologist, por favor llame inmediatamente al 911 o vaya a la sala de emergencias.  Nmeros de bper  - Dr. Nehemiah Massed: [phone]  - Dra. Moye: [phone]  - Dra. Nicole Kindred: [phone]  En caso de inclemencias del [place], por favor llame a Johnsie Kindred principal al [phone] para una actualizacin sobre el [place] de cualquier retraso o cierre.  Consejos para la medicacin en dermatologa: Por favor, guarde las cajas en las que vienen los medicamentos de uso tpico para ayudarle a seguir las instrucciones sobre dnde y cmo usarlos. Las farmacias generalmente imprimen las instrucciones del medicamento slo en las cajas y no directamente en los tubos del [place].   Si su medicamento es muy caro, por favor, pngase en contacto con Zigmund Daniel llamando al [phone] y presione la opcin 4 o envenos un mensaje a travs de Pharmacist, community.   No podemos decirle cul ser su copago por los medicamentos por adelantado ya que esto es diferente dependiendo de la cobertura de su seguro. Sin embargo, es posible que podamos encontrar un medicamento sustituto a Electrical engineer un formulario para que el  seguro cubra el medicamento que se considera necesario.   Si se requiere una autorizacin previa para que su compaa de seguros [place] su medicamento, por favor permtanos de 1 a 2 das hbiles para completar este proceso.  Los precios de los medicamentos varan con frecuencia dependiendo del Environmental consultant de dnde se surte la receta y alguna farmacias pueden ofrecer precios ms baratos.  El sitio web www.goodrx.com tiene cupones para medicamentos de Airline pilot. Los precios aqu no tienen en cuenta lo que podra costar con la ayuda del seguro (puede ser ms barato con su seguro), pero el sitio web puede darle el precio si no utiliz Research scientist (physical sciences).  - Puede imprimir el cupn correspondiente y llevarlo con su receta a la farmacia.  - Tambin puede pasar por nuestra oficina durante el horario de atencin regular y Charity fundraiser una tarjeta de cupones de GoodRx.  - Si necesita que su receta se enve electrnicamente a Chiropodist, informe a nuestra oficina a travs [place]  o por telfono llamando al [phone] y presione la opcin 4.

## 2022-05-26 ENCOUNTER — Telehealth: Payer: Self-pay

## 2022-05-26 NOTE — Progress Notes (Signed)
Reason for Encounter: CCM (Eye Exam)    Contacted patient in regard to updated eye exam information. Patient was offered diabetic eye exam at [facility]. Patient denied an exam at [facility]. Patient stated she will schedule her DM eye exam with Dr. Linton Flemings (Ophthalmology) as she sees him once a month.   [name], CPP notified  [name], [place] Clinical Pharmacy Assistant [phone]

## 2022-05-27 ENCOUNTER — Encounter: Payer: Self-pay | Admitting: Dermatology

## 2022-05-30 ENCOUNTER — Ambulatory Visit: Payer: Medicare HMO | Admitting: Podiatry

## 2022-05-30 DIAGNOSIS — E0865 Diabetes mellitus due to underlying condition with hyperglycemia: Secondary | ICD-10-CM | POA: Diagnosis not present

## 2022-05-30 DIAGNOSIS — L03031 Cellulitis of right toe: Secondary | ICD-10-CM

## 2022-05-30 MED ORDER — DOXYCYCLINE HYCLATE 100 MG PO TABS: 100.0000 mg | ORAL_TABLET | Freq: Two times a day (BID) | ORAL | 0 refills | Status: AC

## 2022-05-30 NOTE — Progress Notes (Signed)
Subjective:  Patient ID: Kristy [name], female    DOB: 1951/05/13,  MRN: [id number]  Chief Complaint  Patient presents with   Toe Pain    71 y.o. female presents with the above complaint.  Patient presents with right hallux paronychia with underlying wound to the medial side.  Patient is a diabetic with last A1c of 8.4.  She said there is skin peeling and she tries to cut the ingrown out herself.  She wanted to get it evaluated she has not seen anyone else prior to seeing me for this.  Hurts with ambulation hurts with pressure.   Review of Systems: Negative except as noted in the HPI. Denies N/V/F/Ch.  Past Medical History:  Diagnosis Date   Allergic rhinitis    Basal cell carcinoma 08/17/2014   Left nasal ala- Mohs at [facility]   Cataract    mild    Crohn's disease ([place]) 10/13/2013   Diverticulosis    GERD (gastroesophageal reflux disease)    Gout    HFrEF (heart failure with reduced ejection fraction) ([place])    a. 12/2008 Cath: EF 45% w/ inf HK; b. 07/2009 Echo: EF 50-55%; c. 08/2018 Echo: EF 20-25%.   Hyperlipidemia    Hypertension    IBS (irritable bowel syndrome)    Left bundle branch block    Neuromuscular disorder (HCC)    neuropathy   NICM (nonischemic cardiomyopathy) ([place])    a. 12/2008 Cath: no significant dzs, EF 45% w/ inf HK->Med Rx; b. 07/2009 Echo: EF 50-55%; c. 08/2018 Echo: EF 20-25%, ant/antsept HK, mild MR, mildly dil LA, nl RV fx; d. 08/2018 Cath: D1 80, otw nonobs dzs->Med rx.   Non-obstructive CAD (coronary artery disease)    a. 12/2008 Cath: no significant dzs, EF 45% w/ inf HK->Med Rx; b. 08/2018 Cath: LM nl, LAD min irregs, D1 80, LCX 3md, RCA min irregs->Med Rx.   Osteoarthritis    Patient has active power of attorney for health care: TOretha Caprice01/11/2020   Symptomatic cholelithiasis    Uncontrolled type 2 diabetes mellitus with stage 3 chronic kidney disease, with long-term current use of insulin          Current Outpatient Medications:     doxycycline (VIBRA-TABS) 100 MG tablet, Take 1 tablet (100 mg total) by mouth 2 (two) times daily for 10 days., Disp: 20 tablet, Rfl: 0   aspirin EC 81 MG EC tablet, Take 1 tablet (81 mg total) by mouth daily., Disp:  , Rfl:    BD INSULIN SYRINGE U/F 31G X 5/16" 0.5 ML MISC, USE AS DIRECTED, Disp: 100 each, Rfl: 11   carvedilol (COREG) 12.5 MG tablet, TAKE 1 TABLET BY MOUTH TWICE DAILY, Disp: 60 tablet, Rfl: 11   chlorhexidine (PERIDEX) 0.12 % solution, RINSE WITH 1 CAPFUL (15ML) FOR 30 SECONDS IN THE MORNING AND IN THE EVENING AFTER TOOTHBRUSHING DO NOT SWALLOW, Disp: , Rfl:    Ensure (ENSURE), Take 237 mLs by mouth daily., Disp: , Rfl:    ENTRESTO 49-51 MG, TAKE 1 TABLET BY MOUTH TWICE DAILY, Disp: 60 tablet, Rfl: 11   insulin detemir (LEVEMIR) 100 UNIT/ML injection, Inject 0.25 mLs (25 Units total) into the skin 2 (two) times daily., Disp: 10 mL, Rfl: 5   insulin regular human CONCENTRATED (HUMULIN R) 500 UNIT/ML injection, Inject 0.13 mLs (65 Units total) into the skin 3 (three) times daily with meals. Pt is using U-100 needles - dose is 13 units TID as of 09/30/21, Disp: 20 mL, Rfl: 1  ivabradine (CORLANOR) 5 MG TABS tablet, Take 5 mg by mouth daily as needed., Disp: , Rfl:    ONETOUCH ULTRA test strip, USE TO CHECK BLOOD SUGAR UP TO 8 TIMES DAILY, Disp: 250 each, Rfl: 3   Probiotic Product (ALIGN PO), Take 1 tablet by mouth daily., Disp: , Rfl:    spironolactone (ALDACTONE) 25 MG tablet, Take 1 tablet by mouth once daily, Disp: 30 tablet, Rfl: 0  Social History   Tobacco Use  Smoking Status Former   Packs/day: 0.75   Years: 30.00   Total pack years: 22.50   Types: Cigarettes   Quit date: 06/06/1997   Years since quitting: 24.9  Smokeless Tobacco Never    Allergies  Allergen Reactions   Fish Oil Other (See Comments)    Gout   Glimepiride Other (See Comments)    REACTION: hypoglycemia   Guanfacine Hcl Other (See Comments)    REACTION: unspecified   Other     Splenda caused  sugars levels to increase   Rosiglitazone Other (See Comments)    CHF   Shellfish Allergy     Gout   Objective:  There were no vitals filed for this visit. There is no height or weight on file to calculate BMI. Constitutional Well developed. Well nourished.  Vascular Dorsalis pedis pulses palpable bilaterally. Posterior tibial pulses palpable bilaterally. Capillary refill normal to all digits.  No cyanosis or clubbing noted. Pedal hair growth normal.  Neurologic Normal speech. Oriented to person, place, and time. Epicritic sensation to light touch grossly present bilaterally.  Dermatologic Right hallux medial border paronychia noted.  Ingrown noted no purulent drainage noted.  Some redness noted circumferential around the nail.  No concern for worsening infection noted  Orthopedic: Normal joint ROM without pain or crepitus bilaterally. No visible deformities. No bony tenderness.   Radiographs: None Assessment:   1. Paronychia of great toe, right   2. Diabetes mellitus due to underlying condition, uncontrolled, with hyperglycemia ([place])    Plan:  Patient was evaluated and treated and all questions answered.  Right hallux paronychia with uncontrolled diabetes -All questions and concerns were discussed with the patient given the patient is a right knee hallux medial border ingrown with underlying paronychia ultimately she will benefit from ingrown nail removal however at this time I will place her on doxycycline to address the redness.  Once her A1c has gone below 8% we can discuss removal of ingrown.  She is a high risk of losing the toe.  She I discussed this with her she states understanding -Doxycycline was sent to the pharmacy  No follow-ups on file.

## 2022-06-06 ENCOUNTER — Ambulatory Visit: Payer: Medicare HMO | Attending: Physician Assistant | Admitting: Physician Assistant

## 2022-06-06 ENCOUNTER — Encounter: Payer: Self-pay | Admitting: Physician Assistant

## 2022-06-06 VITALS — BP 120/60 | HR 84 | Ht 63.0 in | Wt 169.2 lb

## 2022-06-06 DIAGNOSIS — I5022 Chronic systolic (congestive) heart failure: Secondary | ICD-10-CM

## 2022-06-06 DIAGNOSIS — I2510 Atherosclerotic heart disease of native coronary artery without angina pectoris: Secondary | ICD-10-CM | POA: Diagnosis not present

## 2022-06-06 DIAGNOSIS — I10 Essential (primary) hypertension: Secondary | ICD-10-CM

## 2022-06-06 DIAGNOSIS — I428 Other cardiomyopathies: Secondary | ICD-10-CM

## 2022-06-06 DIAGNOSIS — E785 Hyperlipidemia, unspecified: Secondary | ICD-10-CM

## 2022-06-06 NOTE — Progress Notes (Signed)
Cardiology Office Note    Date:  06/06/2022   ID:  Kristy [name], DOB 12/27/1950, MRN [id number]  PCP:  Owens Loffler, MD  Cardiologist:  Ida Rogue, MD  Electrophysiologist:  None   Chief Complaint: Follow-up  History of Present Illness:   Kristy [name] is a 71 y.o. female with history of nonobstructive CAD, HFimpEF secondary to NICM, uncontrolled diabetes with recurrent DKA, Crohn's disease, HTN, HLD, LBBB, prior tobacco use, gout, and GERD who presents for follow-up of echo.   She was initially diagnosed with cardiomyopathy in 2010 with an EF of 45% at that time.  Diagnostic cath showed minimal, nonobstructive CAD and she was medically managed.  Patient had recovery of LV systolic function later in 2010 with echo showing an EF of 50 to 55%.  Over the years, she has struggled with controlling her diabetes and has required recurrent admissions for DKA with AKI in the past requiring intermittent cessation of heart failure therapy.  In 08/2018, she developed worsening dyspnea with repeat echo showing an EF of 20 to 25%.  She underwent diagnostic cath which again showed predominantly nonobstructive CAD with more significant stenosis in a small diagonal branch.  Continued medical therapy was recommended.  Repeat echo in 12/2018 showed subsequent improvement in LV systolic function with an EF of 50 to 55%, moderate concentric LVH, diastolic dysfunction, normal RV systolic function, normal RV cavity size, mildly dilated left atrium, no significant valvular abnormality.  She was admitted in 04/2019 with altered mental status with difficulty with word finding preceded by a fall in the bathtub.  She was found on the floor by a family member.  She was noted to be febrile and in DKA upon her admission.  High-sensitivity troponin was elevated, peaking at 5667.  Echo on 04/21/2019 showed an EF of 35 to 40%, severe hypokinesis of the mid apical anterior wall and anteroseptal wall, normal RV systolic  function, normal RV cavity size, trileaflet aortic valve with mild aortic annular calcification, mild mitral annular calcification, normal size and structure aortic root.  Imaging of the head/brain was unrevealing for stroke.  Prior to discharge, the patient underwent diagnostic cath which showed stable appearance of coronary artery since 08/2018 without new lesion to explain recent decline in LVEF or elevated troponin.  It was felt the elevated troponin may be due to supply demand ischemia and or stress-induced cardiomyopathy in the setting of severe DKA.     Echo from 05/2020 demonstrated an EF of 55 to 60%, no regional wall motion abnormalities, grade 1 diastolic dysfunction, mildly reduced RV systolic function with normal ventricular cavity size, and trivial mitral regurgitation.   She was seen in the office in 01/2022 noting weight gain, dyspnea, and chest tightness.  She was concerned that these symptoms were related to her pharmacy utilizing a different manufacturer for her medication.  Subsequent echo on 03/10/2022 demonstrated an EF of 55%, no regional wall motion abnormalities, grade 1 diastolic dysfunction, normal RV systolic function and ventricular cavity size, no significant valvular abnormalities, and an estimated right atrial pressure of 3 mmHg.  She comes in doing well from a cardiac perspective and is without symptoms of angina or decompensation.  No significant dyspnea.  She does note her blood pressure is soft in the mornings, though after getting up and moving around this typically improves.  She remains on carvedilol and Entresto.  Not currently on spironolactone.  No presyncope or syncope.  Her weight is down 3 pounds when compared  to her last clinic visit.  Blood sugars are better controlled with CGM.  Overall, she feels very well and does not have any active cardiac issues or concerns at this time.   Labs independently reviewed: 11/2021 - direct LDL 187, TC 261, TG 205, HDL 34, albumin  4.2, AST/ALT normal, potassium 4.3, BUN 19, serum creatinine 1.07, Hgb 13.6, PLT 167, A1c 9.4 08/2019 - TSH normal  Past Medical History:  Diagnosis Date   Allergic rhinitis    Basal cell carcinoma 08/17/2014   Left nasal ala- Mohs at [facility]   Cataract    mild    Crohn's disease ([place]) 10/13/2013   Diverticulosis    GERD (gastroesophageal reflux disease)    Gout    HFrEF (heart failure with reduced ejection fraction) ([place])    a. 12/2008 Cath: EF 45% w/ inf HK; b. 07/2009 Echo: EF 50-55%; c. 08/2018 Echo: EF 20-25%.   Hyperlipidemia    Hypertension    IBS (irritable bowel syndrome)    Left bundle branch block    Neuromuscular disorder (HCC)    neuropathy   NICM (nonischemic cardiomyopathy) ([place])    a. 12/2008 Cath: no significant dzs, EF 45% w/ inf HK->Med Rx; b. 07/2009 Echo: EF 50-55%; c. 08/2018 Echo: EF 20-25%, ant/antsept HK, mild MR, mildly dil LA, nl RV fx; d. 08/2018 Cath: D1 80, otw nonobs dzs->Med rx.   Non-obstructive CAD (coronary artery disease)    a. 12/2008 Cath: no significant dzs, EF 45% w/ inf HK->Med Rx; b. 08/2018 Cath: LM nl, LAD min irregs, D1 80, LCX 28md, RCA min irregs->Med Rx.   Osteoarthritis    Patient has active power of attorney for health care: TOretha Caprice01/11/2020   Symptomatic cholelithiasis    Uncontrolled type 2 diabetes mellitus with stage 3 chronic kidney disease, with long-term current use of insulin          Past Surgical History:  Procedure Laterality Date   BREAST BIOPSY Right 06/24/2019   stereo bx/ x clip/ neg   BREAST CYST ASPIRATION     CARDIAC CATHETERIZATION     CATARACT EXTRACTION W/PHACO Right 07/26/2021   Procedure: CATARACT EXTRACTION PHACO AND INTRAOCULAR LENS PLACEMENT ([facility] RIGHT DIABETIC;  Surgeon: PBirder Robson MD;  [facility]  Service: Ophthalmology;  Laterality: Right;  Diabetic 8.99 01:10.2   COLONOSCOPY     DILATION AND CURETTAGE OF UTERUS     LEFT HEART CATH AND CORONARY ANGIOGRAPHY N/A  04/24/2019   Procedure: LEFT HEART CATH AND CORONARY ANGIOGRAPHY;  Surgeon: ENelva Bush MD;  [facility];  Service: Cardiovascular;  Laterality: N/A;   RIGHT/LEFT HEART CATH AND CORONARY ANGIOGRAPHY N/A 08/26/2018   Procedure: RIGHT/LEFT HEART CATH AND CORONARY ANGIOGRAPHY;  Surgeon: AWellington Hampshire MD;  [facility];  Service: Cardiovascular;  Laterality: N/A;   SHOULDER SURGERY     15 + yrs ago    SKIN SURGERY     nose    VAGINAL HYSTERECTOMY      Current Medications: Current Meds  Medication Sig   aspirin EC 81 MG EC tablet Take 1 tablet (81 mg total) by mouth daily.   BD INSULIN SYRINGE U/F 31G X 5/16" 0.5 ML MISC USE AS DIRECTED   carvedilol (COREG) 12.5 MG tablet TAKE 1 TABLET BY MOUTH TWICE DAILY   chlorhexidine (PERIDEX) 0.12 % solution RINSE WITH 1 CAPFUL (15ML) FOR 30 SECONDS IN THE MORNING AND IN THE EVENING AFTER TOOTHBRUSHING DO NOT SWALLOW  doxycycline (VIBRA-TABS) 100 MG tablet Take 1 tablet (100 mg total) by mouth 2 (two) times daily for 10 days.   Ensure (ENSURE) Take 237 mLs by mouth daily.   ENTRESTO 49-51 MG TAKE 1 TABLET BY MOUTH TWICE DAILY   insulin detemir (LEVEMIR) 100 UNIT/ML injection Inject 0.25 mLs (25 Units total) into the skin 2 (two) times daily.   insulin regular human CONCENTRATED (HUMULIN R) 500 UNIT/ML injection Inject 0.13 mLs (65 Units total) into the skin 3 (three) times daily with meals. Pt is using U-100 needles - dose is 13 units TID as of 09/30/21   ONETOUCH ULTRA test strip USE TO CHECK BLOOD SUGAR UP TO 8 TIMES DAILY   Probiotic Product (ALIGN PO) Take 1 tablet by mouth daily.   [DISCONTINUED] spironolactone (ALDACTONE) 25 MG tablet Take 1 tablet by mouth once daily    Allergies:   Fish oil, Glimepiride, Guanfacine hcl, Other, Rosiglitazone, and Shellfish allergy   Social History   Socioeconomic History   Marital status: Widowed    Spouse name: Not on file   Number of children: Not on file    Years of education: Not on file   Highest education level: Not on file  Occupational History   Not on file  Tobacco Use   Smoking status: Former    Packs/day: 0.75    Years: 30.00    Total pack years: 22.50    Types: Cigarettes    Quit date: 06/06/1997    Years since quitting: 25.0   Smokeless tobacco: Never  Vaping Use   Vaping Use: Never used  Substance and Sexual Activity   Alcohol use: No   Drug use: No   Sexual activity: Yes  Other Topics Concern   Not on file  Social History Narrative   Not on file   Social Determinants of Health   Financial Resource Strain: Low Risk  (02/15/2022)   Overall Financial Resource Strain (CARDIA)    Difficulty of Paying Living Expenses: Not very hard  Food Insecurity: No Food Insecurity (02/15/2022)   Hunger Vital Sign    Worried About Running Out of Food in the Last Year: Never true    Ran Out of Food in the Last Year: Never true  Transportation Needs: Not on file  Physical Activity: Not on file  Stress: Not on file  Social Connections: Not on file     Family History:  The patient's family history includes Hypertension in her father; Kidney failure in her brother and mother. There is no history of Colon cancer, Colon polyps, Rectal cancer, Stomach cancer, or Breast cancer.  ROS:   12-point review of systems is negative unless otherwise noted in the HPI.   EKGs/Labs/Other Studies Reviewed:    Studies reviewed were summarized above. The additional studies were reviewed today:  2D echo 03/10/2022: 1. Left ventricular ejection fraction, by estimation, is 55%. The left  ventricle has normal function. The left ventricle has no regional wall  motion abnormalities. Left ventricular diastolic parameters are consistent  with Grade I diastolic dysfunction  (impaired relaxation). The average left ventricular global longitudinal  strain is -10.9 %.   2. Right ventricular systolic function is normal. The right ventricular  size is normal.    3. The mitral valve is normal in structure. No evidence of mitral valve  regurgitation. No evidence of mitral stenosis.   4. The aortic valve is normal in structure. Aortic valve regurgitation is  not visualized. No aortic stenosis is present.  5. The inferior vena cava is normal in size with greater than 50%  respiratory variability, suggesting right atrial pressure of 3 mmHg. __________  2D echo 05/11/2020: 1. Left ventricular ejection fraction, by estimation, is 55 to 60%. The  left ventricle has normal function. The left ventricle has no regional  wall motion abnormalities. Left ventricular diastolic parameters are  consistent with Grade I diastolic  dysfunction (impaired relaxation). Elevated left atrial pressure.   2. Right ventricular systolic function is mildly reduced. The right  ventricular size is normal. Tricuspid regurgitation signal is inadequate  for assessing PA pressure.   3. The mitral valve was not well visualized. Trivial mitral valve  regurgitation. No evidence of mitral stenosis.   4. The aortic valve is tricuspid. Aortic valve regurgitation is not  visualized. No aortic stenosis is present. __________   [facility] 04/24/2019: Conclusions: Stable appearance of coronary arteries since 08/2018 without new lesion to explain recent decline in LVEF or troponin elevation.  I suspect elevated troponin may be due to supply-demand mismatch and/or stress-induced cardiomyopathy in the setting of severe DKA. Focal D1 stenosis remains ~80%.  There is mild to moderate, non-obstructive disease involving the LAD and LCx. Mildly elevated left ventricular filling pressure.   Recommendations: Continue optimization of evidence-based heart failure.  Will continue carvedilol 3.125 mg BID and restart Entresto today. Maintain net even fluid balance; could consider started gentle diuresis tomorrow as renal function allows. Dual antiplatelet therapy with aspirin and clopidogrel for 12  months. Aggressive secondary prevention. __________   2D echo 04/21/2019: 1. Severe hypokinesis of the left ventricular, mid-apical anterior wall  and anteroseptal wall.   2. The left ventricle has moderately reduced systolic function, with an  ejection fraction of 35-40%. The cavity size was normal. Left ventricular  diastolic function could not be evaluated.   3. The right ventricle has normal systolic function. The cavity was  normal. There is mildly increased right ventricular wall thickness.   4. Left atrial size was not well visualized.   5. The aortic valve is tricuspid. Mild thickening of the aortic valve.  Aortic valve regurgitation was not assessed by color flow Doppler. Mild  aortic annular calcification noted.   6. The mitral valve was not well visualized. There is mild mitral annular  calcification present.   7. The aortic root is normal in size and structure.   8. The interatrial septum was not well visualized. ___________   2D echo 12/10/2018: 1. The left ventricle has low normal systolic function, with an ejection  fraction of 50-55%. The cavity size was normal. There is moderate  concentric left ventricular hypertrophy. Left ventricular diastolic  Doppler parameters are consistent with  impaired relaxation.   2. The right ventricle has normal systolic function. The cavity was  normal. There is no increase in right ventricular wall thickness.   3. Left atrial size was mildly dilated.   4. The mitral valve is grossly normal.   5. The tricuspid valve is grossly normal.   6. The aortic valve is grossly normal.   7. Significant improvement in EF since last echo. __________   [facility] 08/26/2018: Ost 1st Diag to 1st Diag lesion is 80% stenosed. Mid Cx to Dist Cx lesion is 40% stenosed.   1.  Significant one-vessel coronary artery disease involving first diagonal with mild to moderate disease in the main vessels.   2.  Left ventricular angiography was not performed.  EF  was severely reduced by echo. 3.  Right heart  catheterization showed normal filling pressures, normal pulmonary pressure and mildly reduced cardiac output at 3.91 with a cardiac index of 2.23.     Recommendations: The patient has nonischemic cardiomyopathy.  Recommend up titration of heart failure medications.  I am going to ask her to increase carvedilol to 6.25 mg twice daily given that she has been taking 3.25 mg twice daily.  Recommend switching lisinopril to Entresto and adding spironolactone.  If ejection fraction remains low after 3 months of optimal medical therapy, consider CRT ICD placement given underlying left bundle branch block. __________   2D echo 08/15/2018: - Left ventricle: The cavity size was mildly dilated. Systolic    function was severely reduced. The estimated ejection fraction    was in the range of 20% to 25%. Hypokinesis of the anterior    myocardium. Hypokinesis of the anteroseptal myocardium. The study    is not technically sufficient to allow evaluation of LV diastolic    function.  - Mitral valve: There was mild regurgitation.  - Left atrium: The atrium was mildly dilated.  - Right ventricle: Systolic function was normal.  - Pulmonary arteries: Systolic pressure was within the normal    range.   EKG:  EKG is ordered today.  The EKG ordered today demonstrates NSR, 84 bpm, LBBB (known)  Recent Labs: 12/05/2021: ALT 17; BUN 19; Creatinine, Ser 1.07; Hemoglobin 13.6; Platelets 167.0; Potassium 4.3; Sodium 141  Recent Lipid Panel    Component Value Date/Time   CHOL 261 (H) 12/05/2021 1035   TRIG 205.0 (H) 12/05/2021 1035   HDL 34.30 (L) 12/05/2021 1035   CHOLHDL 8 12/05/2021 1035   VLDL 41.0 (H) 12/05/2021 1035   LDLCALC 103 (H) 04/22/2019 0700   LDLDIRECT 187.0 12/05/2021 1035    PHYSICAL EXAM:    VS:  BP 120/60 (BP Location: Left Arm, Patient Position: Sitting, Cuff Size: Normal)   Pulse 84   Ht 5' 3"  (1.6 m)   Wt 169 lb 4 oz (76.8 kg)   SpO2 96%    BMI 29.98 kg/m   BMI: Body mass index is 29.98 kg/m.  Physical Exam Vitals reviewed.  Constitutional:      Appearance: She is well-developed.  HENT:     Head: Normocephalic and atraumatic.  Eyes:     General:        Right eye: No discharge.        Left eye: No discharge.  Neck:     Vascular: No JVD.  Cardiovascular:     Rate and Rhythm: Normal rate and regular rhythm.     Pulses:          Posterior tibial pulses are 2+ on the right side and 2+ on the left side.     Heart sounds: Normal heart sounds, S1 normal and S2 normal. Heart sounds not distant. No midsystolic click and no opening snap. No murmur heard.    No friction rub.  Pulmonary:     Effort: Pulmonary effort is normal. No respiratory distress.     Breath sounds: Normal breath sounds. No decreased breath sounds, wheezing or rales.  Chest:     Chest wall: No tenderness.  Abdominal:     General: There is no distension.  Musculoskeletal:     Cervical back: Normal range of motion.     Right lower leg: No edema.     Left lower leg: No edema.  Skin:    General: Skin is warm and dry.     Nails: There  is no clubbing.  Neurological:     Mental Status: She is alert and oriented to person, place, and time.  Psychiatric:        Speech: Speech normal.        Behavior: Behavior normal.        Thought Content: Thought content normal.        Judgment: Judgment normal.     Wt Readings from Last 3 Encounters:  06/06/22 169 lb 4 oz (76.8 kg)  03/01/22 172 lb 4 oz (78.1 kg)  12/05/21 174 lb 4 oz (79 kg)     ASSESSMENT & PLAN:   Nonobstructive CAD: No symptoms concerning for angina.  Most recent LHC from 04/2019 showed stable nonobstructive CAD as outlined above.  Recent echo showed continued preserved LV systolic function.  Continue carvedilol and Entresto.  Given patient preference, and in the context of preserved LV systolic function, we will formally discontinue spironolactone at this time.  Should she have recurrence of  cardiomyopathy, this can be revisited.  Continue risk factor modification.  HFimpEF: She appears euvolemic and well compensated.  Echo from 03/2022 continued to show normal LV systolic function.  She remains on Entresto and carvedilol as outlined above.  No longer on spironolactone secondary to pharmaceutical manufacture, low blood pressures in the mornings, and with preserved LV systolic function.  Not requiring a standing loop diuretic.  HLD: Intolerant to statins, ezetimibe, and PCSK9 inhibitors.  Recommend heart healthy diet.  Could consider referral to the lipid clinic for consideration of bempedoic acid down the road.  HTN: Blood pressure is well controlled in the office today.  Continue medical therapy as outlined above.   Disposition: F/u with Dr. Rockey Situ or an APP in 6 months.   Medication Adjustments/Labs and Tests Ordered: Current medicines are reviewed at length with the patient today.  Concerns regarding medicines are outlined above. Medication changes, Labs and Tests ordered today are summarized above and listed in the Patient Instructions accessible in Encounters.   Signed, Christell Faith, PA-C 06/06/2022 11:50 AM     [facility] [place] [address] [place] [address] [place], [place] [address] [phone]

## 2022-06-06 NOTE — Patient Instructions (Signed)
Medication Instructions:  Your physician has recommended you make the following change in your medication:   STOP Spironolactone   *If you need a refill on your cardiac medications before your next appointment, please call your pharmacy*   Lab Work: None  If you have labs (blood work) drawn today and your tests are completely normal, you will receive your results only by: [facility] (if you have MyChart) OR A paper copy in the mail If you have any lab test that is abnormal or we need to change your treatment, we will call you to review the results.   Testing/Procedures: None   Follow-Up: At [facility], you and your health needs are our priority.  As part of our continuing mission to provide you with exceptional heart care, we have created designated Provider Care Teams.  These Care Teams include your primary Cardiologist (physician) and Advanced Practice Providers (APPs -  Physician Assistants and Nurse Practitioners) who all work together to provide you with the care you need, when you need it.   Your next appointment:   6 month(s)  The format for your next appointment:   In Person  Provider:   Ida Rogue, MD or Christell Faith, PA-C       Important Information About Sugar

## 2022-06-07 ENCOUNTER — Ambulatory Visit (INDEPENDENT_AMBULATORY_CARE_PROVIDER_SITE_OTHER): Payer: Medicare HMO | Admitting: Family Medicine

## 2022-06-07 ENCOUNTER — Encounter: Payer: Self-pay | Admitting: Family Medicine

## 2022-06-07 VITALS — BP 110/60 | HR 76 | Temp 98.2°F | Ht 63.5 in | Wt 170.1 lb

## 2022-06-07 DIAGNOSIS — E1169 Type 2 diabetes mellitus with other specified complication: Secondary | ICD-10-CM | POA: Diagnosis not present

## 2022-06-07 DIAGNOSIS — I2510 Atherosclerotic heart disease of native coronary artery without angina pectoris: Secondary | ICD-10-CM

## 2022-06-07 DIAGNOSIS — I10 Essential (primary) hypertension: Secondary | ICD-10-CM

## 2022-06-07 DIAGNOSIS — E1140 Type 2 diabetes mellitus with diabetic neuropathy, unspecified: Secondary | ICD-10-CM

## 2022-06-07 DIAGNOSIS — E785 Hyperlipidemia, unspecified: Secondary | ICD-10-CM

## 2022-06-07 DIAGNOSIS — E1165 Type 2 diabetes mellitus with hyperglycemia: Secondary | ICD-10-CM

## 2022-06-07 LAB — POCT GLYCOSYLATED HEMOGLOBIN (HGB A1C): Hemoglobin A1C: 9.7 % — AB (ref 4.0–5.6)

## 2022-06-07 NOTE — Progress Notes (Signed)
Kristy [name] T. Tyjay Galindo, MD, [place] [facility] [place], [address]  Phone: [phone]  FAX: [place] - 71 y.o. female  MRN [id number]  Date of Birth: 07/26/1951  Date: 06/07/2022  PCP: Owens Loffler, MD  Referral: Owens Loffler, MD  Chief Complaint  Patient presents with   Follow-up    DM/CHOL/HTN   Subjective:   [name] TOVEY is a 71 y.o. very pleasant female patient with Body mass index is 29.66 kg/m. who presents with the following:  Very well-known patient with coronary disease, severe diabetes that has been poorly controlled now for decades along with poorly controlled hyperlipidemia.  Cardiology just stopped her spironolactone, and she already feels better.   DM, in 01/2022 was referred to [facility] End[facility]. Has not been yet, but she has the information about it. Freestyle Libre - using kinesiotape and that is keeping it on ok.  - had been coming off, some friction.   Max BS 300 or so in the last 2 weeks, but it can vary several hundred points in a day - trying to get it 200-400.  Elenor Legato will alarm at 70, at night, sometimes to twice a night in the middle of the night.  Can drop down to 50 or so.   Boyfriend Kristy [name] is down in [place].  71 yo and with autism  HTN: Tolerating all medications without side effects Stable and at goal No CP, no sob. No HA.  BP Readings from Last 3 Encounters:  06/07/22 110/60  06/06/22 120/60  03/01/22 (!) 333/54    Basic Metabolic Panel:    Component Value Date/Time   NA 141 12/05/2021 1035   NA 136 04/08/2020 1447   K 4.3 12/05/2021 1035   CL 103 12/05/2021 1035   CO2 28 12/05/2021 1035   BUN 19 12/05/2021 1035   BUN 16 04/08/2020 1447   CREATININE 1.07 12/05/2021 1035   CREATININE 0.72 07/27/2014 0902   GLUCOSE 169 (H) 12/05/2021 1035   CALCIUM 10.5 12/05/2021 1035    Lipids: always intolerant to all chol meds  Lipids: Lab Results   Component Value Date   CHOL 261 (H) 12/05/2021   Lab Results  Component Value Date   HDL 34.30 (L) 12/05/2021   Lab Results  Component Value Date   LDLCALC 103 (H) 04/22/2019   Lab Results  Component Value Date   TRIG 205.0 (H) 12/05/2021   Lab Results  Component Value Date   CHOLHDL 8 12/05/2021    Lab Results  Component Value Date   ALT 17 12/05/2021   AST 21 12/05/2021   ALKPHOS 69 12/05/2021   BILITOT 0.6 12/05/2021     Review of Systems is noted in the HPI, as appropriate  Objective:   BP 110/60   Pulse 76   Temp 98.2 F (36.8 C) (Oral)   Ht 5' 3.5" (1.613 m)   Wt 170 lb 2 oz (77.2 kg)   SpO2 97%   BMI 29.66 kg/m   GEN: No acute distress; alert,appropriate. PULM: Breathing comfortably in no respiratory distress PSYCH: Normally interactive.  CV: RRR, no m/g/r  PULM: Normal respiratory rate, no accessory muscle use. No wheezes, crackles or rhonchi   Laboratory and Imaging Data: Results for orders placed or performed in visit on 06/07/22  POCT glycosylated hemoglobin (Hb A1C)  Result Value Ref Range   Hemoglobin A1C 9.7 (A) 4.0 - 5.6 %   HbA1c POC (<>  result, manual entry)     HbA1c, POC (prediabetic range)     HbA1c, POC (controlled diabetic range)       Assessment and Plan:     ICD-10-CM   1. Poorly controlled type 2 diabetes mellitus with neuropathy (HCC)  E11.40 POCT glycosylated hemoglobin (Hb A1C)   E11.65     2. Coronary artery disease, non-occlusive  I25.10     3. Hyperlipidemia associated with type 2 diabetes mellitus ([place])  E11.69    E78.5     4. Essential hypertension  I10      A1c is 9.7 today.  She does have an average blood sugar log with her freestyle libre, and it says that her A1c is roughly 8.5.  She has a large notebook of blood sugar values, and they are often in the 300+ range.  Some of these high blood sugars are by design, so that she does not wake up or get extremely hypoglycemic.  I think this will be a challenging  case indefinitely, and I am hopeful that endocrinology would have some type of suggestion.  I have verbally communicated some of this information with our in-house pharmacist, as well.  Blood pressure stable, feeling better off of spironolactone.  Cardiac function is improved.  EF is 65% now.  Medication Management during today's office visit: No orders of the defined types were placed in this encounter.  Medications Discontinued During This Encounter  Medication Reason   ivabradine (CORLANOR) 5 MG TABS tablet Completed Course    Orders placed today for conditions managed today: Orders Placed This Encounter  Procedures   POCT glycosylated hemoglobin (Hb A1C)    Disposition: Return in about 6 months (around 12/06/2022) for Medicare Wellness Exam.  [employer] One speech-to-text software was used for transcription in this dictation.  Possible transcriptional errors can occur using Editor, commissioning.   Signed,  Maud Deed. Paulanthony Gleaves, MD   Outpatient Encounter Medications as of 06/07/2022  Medication Sig   aspirin EC 81 MG EC tablet Take 1 tablet (81 mg total) by mouth daily.   BD INSULIN SYRINGE U/F 31G X 5/16" 0.5 ML MISC USE AS DIRECTED   carvedilol (COREG) 12.5 MG tablet TAKE 1 TABLET BY MOUTH TWICE DAILY   chlorhexidine (PERIDEX) 0.12 % solution RINSE WITH 1 CAPFUL (15ML) FOR 30 SECONDS IN THE MORNING AND IN THE EVENING AFTER TOOTHBRUSHING DO NOT SWALLOW   doxycycline (VIBRA-TABS) 100 MG tablet Take 1 tablet (100 mg total) by mouth 2 (two) times daily for 10 days.   Ensure (ENSURE) Take 237 mLs by mouth daily.   ENTRESTO 49-51 MG TAKE 1 TABLET BY MOUTH TWICE DAILY   insulin detemir (LEVEMIR) 100 UNIT/ML injection Inject 0.25 mLs (25 Units total) into the skin 2 (two) times daily.   insulin regular human CONCENTRATED (HUMULIN R) 500 UNIT/ML injection Inject 0.13 mLs (65 Units total) into the skin 3 (three) times daily with meals. Pt is using U-100 needles - dose is 13 units TID as of  09/30/21   ONETOUCH ULTRA test strip USE TO CHECK BLOOD SUGAR UP TO 8 TIMES DAILY   Probiotic Product (ALIGN PO) Take 1 tablet by mouth daily.   [DISCONTINUED] ivabradine (CORLANOR) 5 MG TABS tablet Take 5 mg by mouth daily as needed. (Patient not taking: Reported on 06/06/2022)   No facility-administered encounter medications on file as of 06/07/2022.

## 2022-06-09 DIAGNOSIS — E113411 Type 2 diabetes mellitus with severe nonproliferative diabetic retinopathy with macular edema, right eye: Secondary | ICD-10-CM | POA: Diagnosis not present

## 2022-06-12 NOTE — Addendum Note (Signed)
Addended by: Britt Bottom on: 06/12/2022 01:01 PM   Modules accepted: Orders

## 2022-06-17 DIAGNOSIS — E1165 Type 2 diabetes mellitus with hyperglycemia: Secondary | ICD-10-CM | POA: Diagnosis not present

## 2022-06-17 DIAGNOSIS — Z794 Long term (current) use of insulin: Secondary | ICD-10-CM | POA: Diagnosis not present

## 2022-06-17 DIAGNOSIS — E083413 Diabetes mellitus due to underlying condition with severe nonproliferative diabetic retinopathy with macular edema, bilateral: Secondary | ICD-10-CM | POA: Diagnosis not present

## 2022-06-20 ENCOUNTER — Encounter: Payer: Self-pay | Admitting: Podiatry

## 2022-06-20 ENCOUNTER — Ambulatory Visit: Payer: Medicare HMO | Admitting: Podiatry

## 2022-06-20 DIAGNOSIS — L03031 Cellulitis of right toe: Secondary | ICD-10-CM

## 2022-06-20 DIAGNOSIS — E0865 Diabetes mellitus due to underlying condition with hyperglycemia: Secondary | ICD-10-CM

## 2022-06-20 NOTE — Progress Notes (Signed)
Subjective:  Patient ID: Kristy [name], female    DOB: 1951/03/01,  MRN: [id number]  Chief Complaint  Patient presents with   Nail Problem    71 y.o. female presents with the above complaint.  Patient presents with a follow-up of right hallux paronychia with underlying redness.  Patient states she is doing a lot better the antibiotics helped considerably.  She denies any other acute complaints.  Review of Systems: Negative except as noted in the HPI. Denies N/V/F/Ch.  Past Medical History:  Diagnosis Date   Allergic rhinitis    Basal cell carcinoma 08/17/2014   Left nasal ala- Mohs at [facility]   Cataract    mild    Crohn's disease ([place]) 10/13/2013   Diverticulosis    GERD (gastroesophageal reflux disease)    Gout    HFrEF (heart failure with reduced ejection fraction) ([place])    a. 12/2008 Cath: EF 45% w/ inf HK; b. 07/2009 Echo: EF 50-55%; c. 08/2018 Echo: EF 20-25%.   Hyperlipidemia    Hypertension    IBS (irritable bowel syndrome)    Left bundle branch block    Neuromuscular disorder (HCC)    neuropathy   NICM (nonischemic cardiomyopathy) ([place])    a. 12/2008 Cath: no significant dzs, EF 45% w/ inf HK->Med Rx; b. 07/2009 Echo: EF 50-55%; c. 08/2018 Echo: EF 20-25%, ant/antsept HK, mild MR, mildly dil LA, nl RV fx; d. 08/2018 Cath: D1 80, otw nonobs dzs->Med rx.   Non-obstructive CAD (coronary artery disease)    a. 12/2008 Cath: no significant dzs, EF 45% w/ inf HK->Med Rx; b. 08/2018 Cath: LM nl, LAD min irregs, D1 80, LCX 27md, RCA min irregs->Med Rx.   Osteoarthritis    Patient has active power of attorney for health care: TOretha Caprice01/11/2020   Symptomatic cholelithiasis    Uncontrolled type 2 diabetes mellitus with stage 3 chronic kidney disease, with long-term current use of insulin          Current Outpatient Medications:    aspirin EC 81 MG EC tablet, Take 1 tablet (81 mg total) by mouth daily., Disp:  , Rfl:    BD INSULIN SYRINGE U/F 31G X 5/16" 0.5 ML MISC, USE AS  DIRECTED, Disp: 100 each, Rfl: 11   carvedilol (COREG) 12.5 MG tablet, TAKE 1 TABLET BY MOUTH TWICE DAILY, Disp: 60 tablet, Rfl: 11   chlorhexidine (PERIDEX) 0.12 % solution, RINSE WITH 1 CAPFUL (15ML) FOR 30 SECONDS IN THE MORNING AND IN THE EVENING AFTER TOOTHBRUSHING DO NOT SWALLOW, Disp: , Rfl:    Ensure (ENSURE), Take 237 mLs by mouth daily., Disp: , Rfl:    ENTRESTO 49-51 MG, TAKE 1 TABLET BY MOUTH TWICE DAILY, Disp: 60 tablet, Rfl: 11   insulin detemir (LEVEMIR) 100 UNIT/ML injection, Inject 0.25 mLs (25 Units total) into the skin 2 (two) times daily., Disp: 10 mL, Rfl: 5   insulin regular human CONCENTRATED (HUMULIN R) 500 UNIT/ML injection, Inject 0.13 mLs (65 Units total) into the skin 3 (three) times daily with meals. Pt is using U-100 needles - dose is 13 units TID as of 09/30/21, Disp: 20 mL, Rfl: 1   ONETOUCH ULTRA test strip, USE TO CHECK BLOOD SUGAR UP TO 8 TIMES DAILY, Disp: 250 each, Rfl: 3   Probiotic Product (ALIGN PO), Take 1 tablet by mouth daily., Disp: , Rfl:   Social History   Tobacco Use  Smoking Status Former   Packs/day: 0.75   Years: 30.00   Total pack years:  22.50   Types: Cigarettes   Quit date: 06/06/1997   Years since quitting: 25.0  Smokeless Tobacco Never    Allergies  Allergen Reactions   Fish Oil Other (See Comments)    Gout   Glimepiride Other (See Comments)    REACTION: hypoglycemia   Guanfacine Hcl Other (See Comments)    REACTION: unspecified   Other     Splenda caused sugars levels to increase   Rosiglitazone Other (See Comments)    CHF   Shellfish Allergy     Gout   Objective:  There were no vitals filed for this visit. There is no height or weight on file to calculate BMI. Constitutional Well developed. Well nourished.  Vascular Dorsalis pedis pulses palpable bilaterally. Posterior tibial pulses palpable bilaterally. Capillary refill normal to all digits.  No cyanosis or clubbing noted. Pedal hair growth normal.  Neurologic  Normal speech. Oriented to person, place, and time. Epicritic sensation to light touch grossly present bilaterally.  Dermatologic Right hallux medial border paronychia no further noted.  Ingrown noted no purulent drainage noted.  No further redness noted circumferential around the nail.  No concern for worsening infection noted  Orthopedic: Normal joint ROM without pain or crepitus bilaterally. No visible deformities. No bony tenderness.   Radiographs: None Assessment:   No diagnosis found.  Plan:  Patient was evaluated and treated and all questions answered.  Right hallux paronychia with uncontrolled diabetes -All questions and concerns were discussed with the patient -Clinically healed and paronychia has resolved.  I discussed prevention techniques.  If any foot and ankle issues arise in future of asked her to come back and see me.  I discussed glucose management as well.  Return in about 3 months (around 09/19/2022) for [employer] .

## 2022-06-24 ENCOUNTER — Other Ambulatory Visit: Payer: Self-pay | Admitting: Family Medicine

## 2022-07-03 ENCOUNTER — Telehealth: Payer: Self-pay

## 2022-07-03 NOTE — Progress Notes (Signed)
    Chronic Care Management Pharmacy Assistant   Name: Kristy [name]  MRN: [id number] DOB: 05/30/1951  Reason for Encounter: CCM (Appointment Reminder)  Medications: Outpatient Encounter Medications as of 07/03/2022  Medication Sig   aspirin EC 81 MG EC tablet Take 1 tablet (81 mg total) by mouth daily.   BD INSULIN SYRINGE U/F 31G X 5/16" 0.5 ML MISC USE AS DIRECTED   carvedilol (COREG) 12.5 MG tablet TAKE 1 TABLET BY MOUTH TWICE DAILY   chlorhexidine (PERIDEX) 0.12 % solution RINSE WITH 1 CAPFUL (15ML) FOR 30 SECONDS IN THE MORNING AND IN THE EVENING AFTER TOOTHBRUSHING DO NOT SWALLOW   Ensure (ENSURE) Take 237 mLs by mouth daily.   ENTRESTO 49-51 MG TAKE 1 TABLET BY MOUTH TWICE DAILY   insulin regular human CONCENTRATED (HUMULIN R) 500 UNIT/ML injection Inject 0.13 mLs (65 Units total) into the skin 3 (three) times daily with meals. Pt is using U-100 needles - dose is 13 units TID as of 09/30/21   LEVEMIR 100 UNIT/ML injection INJECT 25 UNITS TOTAL INTO THE SKIN TWICE DAILY AS DIRECTED.   ONETOUCH ULTRA test strip USE TO CHECK BLOOD SUGAR UP TO 8 TIMES DAILY   Probiotic Product (ALIGN PO) Take 1 tablet by mouth daily.   No facility-administered encounter medications on file as of 07/03/2022.   Kristy [name] was contacted to remind of upcoming telephone visit with [name] on 07/07/2022 at 11:00. Patient was reminded to have any blood glucose and blood pressure readings available for review at appointment.   Message was left reminding patient of appointment.  CCM referral has been placed prior to visit?  No   Star Rating Drugs: Medication:  Last Fill: Day Supply No Star Rating Drugs  [name], CPP notified  [name], [place] [facility] Assistant [phone]

## 2022-07-07 ENCOUNTER — Ambulatory Visit: Payer: Medicare HMO | Admitting: Pharmacist

## 2022-07-07 DIAGNOSIS — I5022 Chronic systolic (congestive) heart failure: Secondary | ICD-10-CM

## 2022-07-07 DIAGNOSIS — E1169 Type 2 diabetes mellitus with other specified complication: Secondary | ICD-10-CM

## 2022-07-07 DIAGNOSIS — I2510 Atherosclerotic heart disease of native coronary artery without angina pectoris: Secondary | ICD-10-CM

## 2022-07-07 DIAGNOSIS — E1140 Type 2 diabetes mellitus with diabetic neuropathy, unspecified: Secondary | ICD-10-CM

## 2022-07-07 DIAGNOSIS — I10 Essential (primary) hypertension: Secondary | ICD-10-CM

## 2022-07-07 DIAGNOSIS — E113411 Type 2 diabetes mellitus with severe nonproliferative diabetic retinopathy with macular edema, right eye: Secondary | ICD-10-CM | POA: Diagnosis not present

## 2022-07-07 NOTE — Progress Notes (Signed)
Chronic Care Management Pharmacy Note  07/07/2022 Name:  Kristy [name] MRN:  [id number] DOB:  1951/09/03  Summary: CCM F/U visit -DM: A1c 9.7% (06/2022), pt has longstanding uncontrolled DM for years and A1c is actually improved and more stable now -Pt had appt to establish care with endocrine in Jan 2024 (earliest available) -Pt reports BP has been higher (SBP 150s) since stopping spironolactone  Recommendations/Changes made from today's visit: -Recommend medication change to improve BP (increase Entresto) - pt wants to talk to cardiology; Advised pt to contact cardiology  Plan: -Pharmacist follow up televisit scheduled for 1 month -Endocrine appt Jan 2024 -PCP appt 12/06/22    Subjective: Kristy [name] is an 71 y.o. year old female who is a primary patient of Copland, [name], MD.  The CCM team was consulted for assistance with disease management and care coordination needs.    Engaged with patient by telephone for follow up visit in response to provider referral for pharmacy case management and/or care coordination services.   Consent to Services:  The patient was given information about Chronic Care Management services, agreed to services, and gave verbal consent prior to initiation of services.  Please see initial visit note for detailed documentation.   Patient Care Team: Owens Loffler, MD as PCP - General (Family Medicine) Rockey Situ Kathlene November, MD as PCP - Cardiology (Cardiology) Minna Merritts, MD as Consulting Physician (Cardiology) Charlton Haws, [facility] as Pharmacist (Pharmacist)   Patient lives at home alone. She is estranged from family but spends time with boyfriend (63 yo, with ASD).    Recent office visits: 06/07/22 Dr Lorelei Pont OV: A1c 9.7%. GMI 8.5%; D/C ivabradine (pt preference).   12/05/21 Dr Copland OV: AWV. A1c improved to 9.4% using U-500 vials rather than Kwikpen.   04/13/21 - Dr. Owens Loffler, PCP - Pt presented for diabetes follow up.  Continue current medications. Follow up 3 months.  Recent consult visits: 06/06/22 Christell Faith PA (Cardiology): d/c spironolactone per pt preference  03/01/22 PA Christell Faith (Cardiology): f/u - pursue ECHO - WNL. No med changes.  05/03/21 - Cardiology, Dr Rockey Situ - Pt presented for CHF. Start Repatha 140 mg every other week [place].  Hospital visits: None in previous 6 months  Objective:  Lab Results  Component Value Date   CREATININE 1.07 12/05/2021   BUN 19 12/05/2021   GFR 52.63 (L) 12/05/2021   GFRNONAA 70 04/08/2020   GFRAA 80 04/08/2020   NA 141 12/05/2021   K 4.3 12/05/2021   CALCIUM 10.5 12/05/2021   CO2 28 12/05/2021   GLUCOSE 169 (H) 12/05/2021    Lab Results  Component Value Date/Time   HGBA1C 9.7 (A) 06/07/2022 02:31 PM   HGBA1C 9.4 (H) 12/05/2021 10:35 AM   HGBA1C 11.8 (A) 04/13/2021 02:52 PM   HGBA1C 9.7 (H) 12/08/2019 03:00 PM   HGBA1C >14.0 12/02/2018 12:00 AM   GFR 52.63 (L) 12/05/2021 10:35 AM   GFR 55.55 (L) 10/04/2020 04:19 PM   MICROALBUR 18.1 (H) 12/10/2018 10:08 AM   MICROALBUR 36.0 (H) 01/10/2017 03:15 PM    Last diabetic Eye exam:  Lab Results  Component Value Date/Time   HMDIABEYEEXA Retinopathy (A) 04/11/2021 12:00 AM    Last diabetic Foot exam:  up to date  Lab Results  Component Value Date   CHOL 261 (H) 12/05/2021   HDL 34.30 (L) 12/05/2021   LDLCALC 103 (H) 04/22/2019   LDLDIRECT 187.0 12/05/2021   TRIG 205.0 (H) 12/05/2021   CHOLHDL 8 12/05/2021  Latest Ref Rng & Units 12/05/2021   10:35 AM 10/04/2020    4:19 PM 12/08/2019    3:00 PM  Hepatic Function  Total Protein 6.0 - 8.3 g/dL 7.1  7.3  6.8   Albumin 3.5 - 5.2 g/dL 4.2  4.5  3.9   AST 0 - 37 U/L 21  22  17    ALT 0 - 35 U/L 17  19  13    Alk Phosphatase 39 - 117 U/L 69  81  103   Total Bilirubin 0.2 - 1.2 mg/dL 0.6  0.5  0.4   Bilirubin, Direct 0.0 - 0.3 mg/dL 0.1  0.1  0.1    Lab Results  Component Value Date/Time   TSH 1.325 08/07/2019 01:21 PM   TSH 0.586 04/19/2019 06:54  PM   TSH 3.88 07/11/2018 08:46 AM   TSH 1.13 01/10/2017 03:15 PM   FREET4 1.04 04/19/2019 06:54 PM   FREET4 0.98 09/07/2014 02:43 PM       Latest Ref Rng & Units 12/05/2021   10:35 AM 10/04/2020    4:19 PM 12/08/2019    3:00 PM  CBC  WBC 4.0 - 10.5 K/uL 9.5  9.6  10.9   Hemoglobin 12.0 - 15.0 g/dL 13.6  14.3  13.0   Hematocrit 36.0 - 46.0 % 41.6  43.3  40.4   Platelets 150.0 - 400.0 K/uL 167.0  181.0  196.0    Clinical ASCVD: Yes  The ASCVD Risk score (Arnett DK, et al., 2019) failed to calculate for the following reasons:   The patient has a prior MI or stroke diagnosis       12/05/2021   10:25 AM 07/31/2019   12:20 PM 02/12/2019   11:13 AM  Depression screen PHQ 2/9  Decreased Interest 0 0 3  Down, Depressed, Hopeless 0 2 0  PHQ - 2 Score 0 2 3  Altered sleeping  1 0  Tired, decreased energy  1 3  Change in appetite  1 3  Feeling bad or failure about yourself   0 0  Trouble concentrating  0 0  Moving slowly or fidgety/restless  0 2  Suicidal thoughts  0 0  PHQ-9 Score  5 11  Difficult doing work/chores   Not difficult at all    Social History   Tobacco Use  Smoking Status Former   Packs/day: 0.75   Years: 30.00   Total pack years: 22.50   Types: Cigarettes   Quit date: 06/06/1997   Years since quitting: 25.1  Smokeless Tobacco Never   BP Readings from Last 3 Encounters:  06/07/22 110/60  06/06/22 120/60  03/01/22 (!) 144/72   Pulse Readings from Last 3 Encounters:  06/07/22 76  06/06/22 84  03/01/22 85   Wt Readings from Last 3 Encounters:  06/07/22 170 lb 2 oz (77.2 kg)  06/06/22 169 lb 4 oz (76.8 kg)  03/01/22 172 lb 4 oz (78.1 kg)   BMI Readings from Last 3 Encounters:  06/07/22 29.66 kg/m  06/06/22 29.98 kg/m  03/01/22 30.51 kg/m    Assessment/Interventions: Review of patient past medical history, allergies, medications, health status, including review of consultants reports, laboratory and other test data, was performed as part of comprehensive  evaluation and provision of chronic care management services.   SDOH:  (Social Determinants of Health) assessments and interventions performed: Yes SDOH Interventions    Flowsheet Row Chronic Care Management from 02/15/2022 in [place] at [place] Management from 09/12/2021 in [facility]  at [place] Management from 01/10/2021 in [place] at [place] Interventions     Food Insecurity Interventions Intervention Not Indicated -- --  Financial Strain Interventions Other (Comment)  Ralph Leyden Cares - Humulin] Other (Comment)  [Assistance applied/approved U-500 insulin] Intervention Not Indicated  [Meds affordable]         SDOH Screenings   Food Insecurity: No Food Insecurity (02/15/2022)  Depression (PHQ2-9): Low Risk  (12/05/2021)  Financial Resource Strain: Low Risk  (02/15/2022)  Tobacco Use: Medium Risk (06/20/2022)    CCM Care Plan  Allergies  Allergen Reactions   Fish Oil Other (See Comments)    Gout   Glimepiride Other (See Comments)    REACTION: hypoglycemia   Guanfacine Hcl Other (See Comments)    REACTION: unspecified   Other     Splenda caused sugars levels to increase   Rosiglitazone Other (See Comments)    CHF   Shellfish Allergy     Gout    Medications Reviewed Today     Reviewed by Vertell Novak, LPN (Licensed Practical Nurse) on 06/20/22 at 1354  Med List Status: <None>   Medication Order Taking? Sig Documenting Provider Last Dose Status Informant  aspirin EC 81 MG EC tablet [id number] No Take 1 tablet (81 mg total) by mouth daily. Hillary Bow, MD Taking Active Other  BD INSULIN SYRINGE U/F 31G X 5/16" 0.5 ML MISC [id number] No USE AS DIRECTED Copland, Spencer, MD Taking Active   carvedilol (COREG) 12.5 MG tablet [id number] No TAKE 1 TABLET BY MOUTH TWICE DAILY Gollan, Kathlene November, MD Taking Active   chlorhexidine (PERIDEX) 0.12 % solution [id number] No RINSE WITH 1 CAPFUL (15ML) FOR 30 SECONDS IN THE  MORNING AND IN THE EVENING AFTER TOOTHBRUSHING DO NOT SWALLOW [provider] Taking Active   Ensure (ENSURE) [id number] No Take 237 mLs by mouth daily. [provider] Taking Active Other  ENTRESTO 49-51 MG [id number] No TAKE 1 TABLET BY MOUTH TWICE DAILY Gollan, Kathlene November, MD Taking Active   insulin detemir (LEVEMIR) 100 UNIT/ML injection [id number] No Inject 0.25 mLs (25 Units total) into the skin 2 (two) times daily. Copland, [name], MD Taking Active   insulin regular human CONCENTRATED (HUMULIN R) 500 UNIT/ML injection [id number] No Inject 0.13 mLs (65 Units total) into the skin 3 (three) times daily with meals. Pt is using U-100 needles - dose is 13 units TID as of 09/30/21 Copland, [name], MD Taking Active   [facility] ULTRA test strip [id number] No USE TO CHECK BLOOD SUGAR UP TO 8 TIMES DAILY Copland, Spencer, MD Taking Active   Probiotic Product (ALIGN PO) [id number] No Take 1 tablet by mouth daily. [provider] Taking Active             Patient Active Problem List   Diagnosis Date Noted   Statin intolerance 12/06/2021   Advanced care planning/counseling discussion: DNR / DNI 10/04/2020   Patient has active power of attorney for health care: [name] 10/04/2020   CKD stage 2 due to type 2 diabetes mellitus ([place]) 10/04/2020   NICM (nonischemic cardiomyopathy) ([place]) 82/50/5397   Chronic systolic CHF (congestive heart failure) ([place]) 08/22/2018   Coronary artery disease, non-occlusive 09/13/2016   Crohn's disease ([place]) 10/13/2013   Major depressive disorder, recurrent, in full remission ([place]) 05/01/2011   PSORIASIS 08/02/2009   Hyperlipidemia associated with type 2 diabetes mellitus ([place]) 05/11/2009   GOUT 02/05/2009   Uncontrolled type 2 diabetes mellitus with stage 3 chronic kidney disease,  with long-term current use of insulin    Essential hypertension 04/18/2007   LBBB (left bundle branch block) 04/18/2007   ALLERGIC RHINITIS 04/18/2007     Immunization History  Administered Date(s) Administered   PPD Test 08/29/2019   Td 10/02/1993, 01/06/2008    Conditions to be addressed/monitored:  Hypertension, Hyperlipidemia, Diabetes, Heart Failure, and Coronary Artery Disease   Care Plan : [place]  Updates made by Charlton Haws, [place] since 07/10/2022 12:00 AM     Problem: Hypertension, Hyperlipidemia, Diabetes, Heart Failure, and Coronary Artery Disease   Priority: High     Long-Range Goal: Disease mgmt   Start Date: 12/15/2021  Expected End Date: 12/16/2022  This Visit's Progress: On track  Recent Progress: On track  Priority: High  Note:   Current Barriers:  Unable to achieve control of diabetes and cholesterol  Pharmacist Clinical Goal(s):  Patient will contact provider office for questions/concerns as evidenced notation of same in electronic health record through collaboration with PharmD and provider.   Interventions: 1:1 collaboration with Owens Loffler, MD regarding development and update of comprehensive plan of care as evidenced by provider attestation and co-signature Inter-disciplinary care team collaboration (see longitudinal plan of care) Comprehensive medication review performed; medication list updated in electronic medical record  Hyperlipidemia/CAD: (LDL goal < 70) -Uncontrolled - LDL 187 (11/2021). LDL previously <130 in 2011, jumped to 195 in 2015. She has not been able to tolerate any cholesterol medication. -Hx CAD (single-vessel disease, medical mgmt); hx stroke x2 per patient -Current treatment: None -Medications previously tried: atorvastatin 80, rosuvastatin 5-10, pravastatin 10, Zetia, Repatha - felt awful for 14 days -Educated on Cholesterol goals; majority of cholesterol made endogenously and most medications target this; discussed Praluent, Nexletol and Leqvio - last remaining options for LDL lowering; she would be a candidate for Leqvio but would need to discuss  with cardiology/lipid clinic  Hypertension / Heart Failure (BP goal <130/80) -Not ideally controlled - pt reports BP has been elevated since stopping spironolactone a few weeks ago -Current home readings: 150s/70 -HF type: Combined systolic and diastolic -LVEF: 88-82% (8/00/34) - improved, previously < 40% -Current treatment: Carvedilol 12.5 mg BID - Appropriate, Effective, Safe, Accessible Entresto 49-51 mg BID -Appropriate, Effective, Safe, Accessible -Medications previously tried: spironolactone, ivabradine -Counseled to monitor BP at home at least once monthly, document, and provide log at future appointments -Discussed likely reason for elevated BP is lack of spironolactone; would recommend increasing Entresto, this is prescribed by cardiology and pt wants to discuss with them -Recommended to continue current medication; advised pt to contact cardiology regarding elevated BP  Diabetes (A1c goal <8%) -Uncontrolled, improved - A1c 9.7% (06/2022), improved from 11.2% after receiving Humulin R U-500 in vials instead of pens, for some reason the Vial insulin is working better -Current home glucose readings - via FL2. Using reader device. She wants to switch suppliers from [employer] to [facility] -DM history: Diagnosed with type 2 diabetes 1998, transitioned to insulin 2005.  Multiple episodes of DKA. Pt has seen multiple endocrinologist and frequently lost to follow up. She has been referred to Endocrine [facility] on [address]) and has appt in Jan 2024 to establish care -Current medications: Levemir 25 units BID -  Query appropriate Humulin R U-500 - 65 units (13u w/ U-100 syringe) TID w/ meals (PAP)- Appropriate, Query Effective Freestyle Libre 2 ([facility]) - Appropriate, Effective, Safe, Accessible -Medications previously tried: Lantus (pt reports ineffective); rosiglitazone (HF-pt insists this is the reason she developed  HF), glimepiride (hypoglycemia); metformin (unknown); Invokana  (increased thirst); Bydureon (cost/difficult administration) -Previously discussed it is typically inappropriate to take another type of insulin with Humulin R U-500; pt insists Levemir has helped "even out" her sugars and prevent low sugars -Previously discussed possibility of starting GLP-1 RA, pt does not want to start new medication now but will think about it for future -Reviewed difference between CGM sensor glucose and blood glucose -Recommend to continue current medication; ordered CGM to [facility] per pt request; keep appt with endocrine in January  Patient Goals/Self-Care Activities Patient will:  - take medications as prescribed - check glucose daily before meals, 2 hours after meals, and bedtime, document, and provide at future appointments - check blood pressure daily, document, and provide at future appointments     Medication Assistance:  [employer] - Humulin R U-500 (approved through 10/01/2022)  Compliance/Adherence/Medication fill history: Care Gaps: DEXA scan  Star-Rating Drugs: None  Medication Access: Within the past 30 days, how often has patient missed a dose of medication? 0 Is a pillbox or other method used to improve adherence? Yes  Factors that may affect medication adherence? adverse effects of medications Are meds synced by current pharmacy? No  Are meds delivered by current pharmacy? No  Does patient experience delays in picking up medications due to transportation concerns? No   [facility]stream Services Reviewed: Is patient disadvantaged to use [facility]?: No  Current Rx insurance plan: [employer] [place] Name and location of Current pharmacy:  [place], [place] [address] [place] [address] Phone: [phone] Fax: [phone]  [facility] services reviewed with patient today?: No  Patient requests to transfer care to [facility] Pharmacy?: No  Reason patient declined to change pharmacies: Loyalty to  other pharmacy/Patient preference   Care Plan and Follow Up Patient Decision:  Patient agrees to Care Plan and Follow-up.  Follow Up Plan: Telephone follow up appointment with care management team member scheduled for: 1 month  [name], PharmD, BCACP Clinical Pharmacist [facility] at [facility] [phone]

## 2022-07-11 ENCOUNTER — Other Ambulatory Visit: Payer: Self-pay | Admitting: Cardiovascular Disease

## 2022-07-12 DIAGNOSIS — Z794 Long term (current) use of insulin: Secondary | ICD-10-CM | POA: Diagnosis not present

## 2022-07-12 DIAGNOSIS — E1165 Type 2 diabetes mellitus with hyperglycemia: Secondary | ICD-10-CM | POA: Diagnosis not present

## 2022-07-13 NOTE — Patient Instructions (Addendum)
Visit Information  Phone number for Pharmacist: [phone]   Goals Addressed   None     Patient Care Plan: CCM Pharmacy Care Plan     Problem Identified: Hypertension, Hyperlipidemia, Diabetes, Heart Failure, and Coronary Artery Disease   Priority: High     Long-Range Goal: Disease mgmt   Start Date: 12/15/2021  Expected End Date: 12/16/2022  This Visit's Progress: On track  Recent Progress: On track  Priority: High  Note:   Current Barriers:  Unable to achieve control of diabetes and cholesterol  Pharmacist Clinical Goal(s):  Patient will contact provider office for questions/concerns as evidenced notation of same in electronic health record through collaboration with PharmD and provider.   Interventions: 1:1 collaboration with Owens Loffler, MD regarding development and update of comprehensive plan of care as evidenced by provider attestation and co-signature Inter-disciplinary care team collaboration (see longitudinal plan of care) Comprehensive medication review performed; medication list updated in electronic medical record  Hyperlipidemia/CAD: (LDL goal < 70) -Uncontrolled - LDL 187 (11/2021). LDL previously <130 in 2011, jumped to 195 in 2015. She has not been able to tolerate any cholesterol medication. -Hx CAD (single-vessel disease, medical mgmt); hx stroke x2 per patient -Current treatment: None -Medications previously tried: atorvastatin 80, rosuvastatin 5-10, pravastatin 10, Zetia, Repatha - felt awful for 14 days -Educated on Cholesterol goals; majority of cholesterol made endogenously and most medications target this; discussed Praluent, Nexletol and Leqvio - last remaining options for LDL lowering; she would be a candidate for Leqvio but would need to discuss with cardiology/lipid clinic  Hypertension / Heart Failure (BP goal <130/80) -Not ideally controlled - pt reports BP has been elevated since stopping spironolactone a few weeks ago -Current home  readings: 150s/70 -HF type: Combined systolic and diastolic -LVEF: 21-30% (8/65/78) - improved, previously < 40% -Current treatment: Carvedilol 12.5 mg BID - Appropriate, Effective, Safe, Accessible Entresto 49-51 mg BID -Appropriate, Effective, Safe, Accessible -Medications previously tried: spironolactone, ivabradine -Counseled to monitor BP at home at least once monthly, document, and provide log at future appointments -Discussed likely reason for elevated BP is lack of spironolactone; would recommend increasing Entresto, this is prescribed by cardiology and pt wants to discuss with them -Recommended to continue current medication; advised pt to contact cardiology regarding elevated BP  Diabetes (A1c goal <8%) -Uncontrolled, improved - A1c 9.7% (06/2022), improved from 11.2% after receiving Humulin R U-500 in vials instead of pens, for some reason the Vial insulin is working better -Current home glucose readings - via FL2. Using reader device. She wants to switch suppliers from [employer] to [employer] -DM history: Diagnosed with type 2 diabetes 1998, transitioned to insulin 2005.  Multiple episodes of DKA. Pt has seen multiple endocrinologist and frequently lost to follow up. She has been referred to Endocrine [facility] on [address]) and has appt in Jan 2024 to establish care -Current medications: Levemir 25 units BID -  Query appropriate Humulin R U-500 - 65 units (13u w/ U-100 syringe) TID w/ meals (PAP)- Appropriate, Query Effective Freestyle Libre 2 ([facility]) - Appropriate, Effective, Safe, Accessible -Medications previously tried: Lantus (pt reports ineffective); rosiglitazone (HF-pt insists this is the reason she developed HF), glimepiride (hypoglycemia); metformin (unknown); Invokana (increased thirst); Bydureon (cost/difficult administration) -Previously discussed it is typically inappropriate to take another type of insulin with Humulin R U-500; pt insists Levemir has helped "even  out" her sugars and prevent low sugars -Previously discussed possibility of starting GLP-1 RA, pt does not want to start new medication  now but will think about it for future -Reviewed difference between CGM sensor glucose and blood glucose -Recommend to continue current medication; ordered CGM to [facility] per pt request; keep appt with endocrine in January  Patient Goals/Self-Care Activities Patient will:  - take medications as prescribed - check glucose daily before meals, 2 hours after meals, and bedtime, document, and provide at future appointments - check blood pressure daily, document, and provide at future appointments      Patient verbalizes understanding of instructions and care plan provided today and agrees to view in [place]. Active MyChart status and patient understanding of how to access instructions and care plan via MyChart confirmed with patient.    Telephone follow up appointment with pharmacy team member scheduled for: 1 month  [name], PharmD, [facility] Clinical Pharmacist [facility] at [facility] [phone]

## 2022-07-17 DIAGNOSIS — E113412 Type 2 diabetes mellitus with severe nonproliferative diabetic retinopathy with macular edema, left eye: Secondary | ICD-10-CM | POA: Diagnosis not present

## 2022-07-18 DIAGNOSIS — E1165 Type 2 diabetes mellitus with hyperglycemia: Secondary | ICD-10-CM | POA: Diagnosis not present

## 2022-07-18 DIAGNOSIS — Z794 Long term (current) use of insulin: Secondary | ICD-10-CM | POA: Diagnosis not present

## 2022-08-02 ENCOUNTER — Telehealth: Payer: Self-pay

## 2022-08-02 NOTE — Progress Notes (Signed)
    Chronic Care Management Pharmacy Assistant   Name: Kristy [name]  MRN: [id number] DOB: 03/18/1951  Reason for Encounter: CCM (Appointment Reminder)  Medications: Outpatient Encounter Medications as of 08/02/2022  Medication Sig   aspirin EC 81 MG EC tablet Take 1 tablet (81 mg total) by mouth daily.   BD INSULIN SYRINGE U/F 31G X 5/16" 0.5 ML MISC USE AS DIRECTED   carvedilol (COREG) 12.5 MG tablet TAKE 1 TABLET BY MOUTH TWICE DAILY   chlorhexidine (PERIDEX) 0.12 % solution RINSE WITH 1 CAPFUL (15ML) FOR 30 SECONDS IN THE MORNING AND IN THE EVENING AFTER TOOTHBRUSHING DO NOT SWALLOW   Ensure (ENSURE) Take 237 mLs by mouth daily.   ENTRESTO 49-51 MG TAKE 1 TABLET BY MOUTH TWICE DAILY   insulin regular human CONCENTRATED (HUMULIN R) 500 UNIT/ML injection Inject 0.13 mLs (65 Units total) into the skin 3 (three) times daily with meals. Pt is using U-100 needles - dose is 13 units TID as of 09/30/21   LEVEMIR 100 UNIT/ML injection INJECT 25 UNITS TOTAL INTO THE SKIN TWICE DAILY AS DIRECTED.   ONETOUCH ULTRA test strip USE TO CHECK BLOOD SUGAR UP TO 8 TIMES DAILY   Probiotic Product (ALIGN PO) Take 1 tablet by mouth daily.   No facility-administered encounter medications on file as of 08/02/2022.   [name] was contacted to remind of upcoming telephone visit with [name] on 08/07/2022 at 2:15. Patient was reminded to have any blood glucose and blood pressure readings available for review at appointment.   Message was left reminding patient of appointment.  CCM referral has been placed prior to visit?  Yes   Star Rating Drugs: Medication:  Last Fill: Day Supply No star rating drugs  [name], CPP notified  [name], [place] Clinical Pharmacy Assistant [phone]

## 2022-08-07 ENCOUNTER — Ambulatory Visit: Payer: Medicare HMO | Admitting: Pharmacist

## 2022-08-07 DIAGNOSIS — E1169 Type 2 diabetes mellitus with other specified complication: Secondary | ICD-10-CM

## 2022-08-07 DIAGNOSIS — E1140 Type 2 diabetes mellitus with diabetic neuropathy, unspecified: Secondary | ICD-10-CM

## 2022-08-07 DIAGNOSIS — I2510 Atherosclerotic heart disease of native coronary artery without angina pectoris: Secondary | ICD-10-CM

## 2022-08-07 DIAGNOSIS — I10 Essential (primary) hypertension: Secondary | ICD-10-CM

## 2022-08-07 NOTE — Progress Notes (Unsigned)
Chronic Care Management Pharmacy Note  08/07/2022 Name:  [name] MRN:  [id number] DOB:  Apr 14, 1951  Summary: CCM F/U visit -DM: A1c 9.7% (06/2022), pt has longstanding uncontrolled DM for years and A1c is actually improved and more stable now -Average BG (90 days) per CGM: 217 (estimated A1c 9.2%) -Pt has appt to establish care with endocrine in Jan 2024 (earliest available)  Recommendations/Changes made from today's visit: -No med changes; consider adding Trulicity, pt wants to wait  Plan: -Pharmacist follow up televisit scheduled for 3 months -Endocrine appt Jan 2024 -PCP appt 12/06/22    Subjective: Kristy [name] is an 71 y.o. year old female who is a primary patient of Kristy [name].  The CCM team was consulted for assistance with disease management and care coordination needs.    Engaged with patient by telephone for follow up visit in response to provider referral for pharmacy case management and/or care coordination services.   Consent to Services:  The patient was given information about Chronic Care Management services, agreed to services, and gave verbal consent prior to initiation of services.  Please see initial visit note for detailed documentation.   Patient Care Team: Owens Loffler, [name] as PCP - General (Family Medicine) Kristy [name] as PCP - Cardiology (Cardiology) Minna Merritts, [name] as Consulting Physician (Cardiology) Charlton Haws, [facility] as Pharmacist (Pharmacist)   Patient lives at home alone. She is estranged from family but spends time with boyfriend (73 yo, with ASD).    Recent office visits: 06/07/22 Dr Lorelei Pont OV: A1c 9.7%. GMI 8.5%; D/C ivabradine (pt preference).   12/05/21 Dr Copland OV: AWV. A1c improved to 9.4% using U-500 vials rather than Kwikpen.   04/13/21 - Dr. Owens Loffler, PCP - Pt presented for diabetes follow up. Continue current medications. Follow up 3 months.  Recent consult visits: 06/06/22 Christell Faith PA (Cardiology): d/c spironolactone per pt preference  03/01/22 PA Christell Faith (Cardiology): f/u - pursue ECHO - WNL. No med changes.  05/03/21 - Cardiology, Dr Kristy Situ - Pt presented for CHF. Start Repatha 140 mg every other week [place].  Hospital visits: None in previous 6 months  Objective:  Lab Results  Component Value Date   CREATININE 1.07 12/05/2021   BUN 19 12/05/2021   GFR 52.63 (L) 12/05/2021   GFRNONAA 70 04/08/2020   GFRAA 80 04/08/2020   NA 141 12/05/2021   K 4.3 12/05/2021   CALCIUM 10.5 12/05/2021   CO2 28 12/05/2021   GLUCOSE 169 (H) 12/05/2021    Lab Results  Component Value Date/Time   HGBA1C 9.7 (A) 06/07/2022 02:31 PM   HGBA1C 9.4 (H) 12/05/2021 10:35 AM   HGBA1C 11.8 (A) 04/13/2021 02:52 PM   HGBA1C 9.7 (H) 12/08/2019 03:00 PM   HGBA1C >14.0 12/02/2018 12:00 AM   GFR 52.63 (L) 12/05/2021 10:35 AM   GFR 55.55 (L) 10/04/2020 04:19 PM   MICROALBUR 18.1 (H) 12/10/2018 10:08 AM   MICROALBUR 36.0 (H) 01/10/2017 03:15 PM    Last diabetic Eye exam:  Lab Results  Component Value Date/Time   HMDIABEYEEXA Retinopathy (A) 04/11/2021 12:00 AM    Last diabetic Foot exam:  up to date  Lab Results  Component Value Date   CHOL 261 (H) 12/05/2021   HDL 34.30 (L) 12/05/2021   LDLCALC 103 (H) 04/22/2019   LDLDIRECT 187.0 12/05/2021   TRIG 205.0 (H) 12/05/2021   CHOLHDL 8 12/05/2021       Latest Ref Rng & Units 12/05/2021  10:35 AM 10/04/2020    4:19 PM 12/08/2019    3:00 PM  Hepatic Function  Total Protein 6.0 - 8.3 g/dL 7.1  7.3  6.8   Albumin 3.5 - 5.2 g/dL 4.2  4.5  3.9   AST 0 - 37 U/L _0 ALT 0 - 35 U/L _1 Alk Phosphatase 39 - 117 U/L 69  81  103   Total Bilirubin 0.2 - 1.2 mg/dL 0.6  0.5  0.4   Bilirubin, Direct 0.0 - 0.3 mg/dL 0.1  0.1  0.1    Lab Results  Component Value Date/Time   TSH 1.325 08/07/2019 01:21 PM   TSH 0.586 04/19/2019 06:54 PM   TSH 3.88 07/11/2018 08:46 AM   TSH 1.13 01/10/2017 03:15 PM   FREET4 1.04  04/19/2019 06:54 PM   FREET4 0.98 09/07/2014 02:43 PM       Latest Ref Rng & Units 12/05/2021   10:35 AM 10/04/2020    4:19 PM 12/08/2019    3:00 PM  CBC  WBC 4.0 - 10.5 K/uL 9.5  9.6  10.9   Hemoglobin 12.0 - 15.0 g/dL 13.6  14.3  13.0   Hematocrit 36.0 - 46.0 % 41.6  43.3  40.4   Platelets 150.0 - 400.0 K/uL 167.0  181.0  196.0    Clinical ASCVD: Yes  The ASCVD Risk score (Arnett DK, et al., 2019) failed to calculate for the following reasons:   The patient has a prior MI or stroke diagnosis       12/05/2021   10:25 AM 07/31/2019   12:20 PM 02/12/2019   11:13 AM  Depression screen PHQ 2/9  Decreased Interest 0 0 3  Down, Depressed, Hopeless 0 2 0  PHQ - 2 Score 0 2 3  Altered sleeping  1 0  Tired, decreased energy  1 3  Change in appetite  1 3  Feeling bad or failure about yourself   0 0  Trouble concentrating  0 0  Moving slowly or fidgety/restless  0 2  Suicidal thoughts  0 0  PHQ-9 Score  5 11  Difficult doing work/chores   Not difficult at all    Social History   Tobacco Use  Smoking Status Former   Packs/day: 0.75   Years: 30.00   Total pack years: 22.50   Types: Cigarettes   Quit date: 06/06/1997   Years since quitting: 25.1  Smokeless Tobacco Never   BP Readings from Last 3 Encounters:  06/07/22 110/60  06/06/22 120/60  03/01/22 (!) 144/72   Pulse Readings from Last 3 Encounters:  06/07/22 76  06/06/22 84  03/01/22 85   Wt Readings from Last 3 Encounters:  06/07/22 170 lb 2 oz (77.2 kg)  06/06/22 169 lb 4 oz (76.8 kg)  03/01/22 172 lb 4 oz (78.1 kg)   BMI Readings from Last 3 Encounters:  06/07/22 29.66 kg/m  06/06/22 29.98 kg/m  03/01/22 30.51 kg/m    Assessment/Interventions: Review of patient past medical history, allergies, medications, health status, including review of consultants reports, laboratory and other test data, was performed as part of comprehensive evaluation and provision of chronic care management services.   SDOH:  (Social  Determinants of Health) assessments and interventions performed: Yes SDOH Interventions    Flowsheet Row Chronic Care Management from 02/15/2022 in [place] at [place] Management from 09/12/2021 in [place] at [place] Management from 01/10/2021  in [employer] at [place] Interventions Intervention Not Indicated -- --  Financial Strain Interventions Other (Comment)  Ralph [place] Cares - Humulin] Other (Comment)  [Assistance applied/approved U-500 insulin] Intervention Not Indicated  [Meds affordable]         SDOH Screenings   Food Insecurity: No Food Insecurity (02/15/2022)  Depression (PHQ2-9): Low Risk  (12/05/2021)  Financial Resource Strain: Low Risk  (02/15/2022)  Tobacco Use: Medium Risk (06/20/2022)    CCM Care Plan  Allergies  Allergen Reactions   Fish Oil Other (See Comments)    Gout   Glimepiride Other (See Comments)    REACTION: hypoglycemia   Guanfacine Hcl Other (See Comments)    REACTION: unspecified   Other     Splenda caused sugars levels to increase   Rosiglitazone Other (See Comments)    CHF   Shellfish Allergy     Gout    Medications Reviewed Today     Reviewed by Charlton Haws, [facility] (Pharmacist) on 08/07/22 at 1437  Med List Status: <None>   Medication Order Taking? Sig Documenting Provider Last Dose Status Informant  aspirin EC 81 MG EC tablet [id number] Yes Take 1 tablet (81 mg total) by mouth daily. Hillary Bow, [name] Taking Active Other  BD INSULIN SYRINGE U/F 31G X 5/16" 0.5 ML MISC [id number] Yes USE AS DIRECTED Kristy [name] Taking Active   carvedilol (COREG) 12.5 MG tablet [id number] Yes TAKE 1 TABLET BY MOUTH TWICE DAILY Gollan, Kathlene November, [name] Taking Active   chlorhexidine (PERIDEX) 0.12 % solution [id number] Yes RINSE WITH 1 CAPFUL (15ML) FOR 30 SECONDS IN THE MORNING AND IN THE EVENING AFTER TOOTHBRUSHING DO NOT SWALLOW Provider,  Historical, [name] Taking Active   Ensure [facility]) [id number] Yes Take 237 mLs by mouth daily. Provider, Historical, [name] Taking Active Other  ENTRESTO 49-51 MG [id number] Yes TAKE 1 TABLET BY MOUTH TWICE DAILY Gollan, Kathlene November, [name] Taking Active   insulin regular human CONCENTRATED (HUMULIN R) 500 UNIT/ML injection [id number] Yes Inject 0.13 mLs (65 Units total) into the skin 3 (three) times daily with meals. Pt is using U-100 needles - dose is 13 units TID as of 09/30/21 Kristy [name] Taking Active   LEVEMIR 100 UNIT/ML injection [id number] Yes INJECT 25 UNITS TOTAL INTO THE SKIN TWICE DAILY AS DIRECTED. Owens Loffler, [name] Taking Active   [facility] ULTRA test strip [id number] Yes USE TO CHECK BLOOD SUGAR UP TO 8 TIMES DAILY Copland, [place], [name] Taking Active   Probiotic Product (ALIGN PO) [id number] Yes Take 1 tablet by mouth daily. Provider, Historical, [name] Taking Active             Patient Active Problem List   Diagnosis Date Noted   Statin intolerance 12/06/2021   Advanced care planning/counseling discussion: DNR / DNI 10/04/2020   Patient has active power of attorney for health care: [name] 10/04/2020   CKD stage 2 due to type 2 diabetes mellitus ([place]) 10/04/2020   NICM (nonischemic cardiomyopathy) ([place]) 12/21/2246   Chronic systolic CHF (congestive heart failure) ([place]) 08/22/2018   Coronary artery disease, non-occlusive 09/13/2016   Crohn's disease ([place]) 10/13/2013   Major depressive disorder, recurrent, in full remission ([place]) 05/01/2011   PSORIASIS 08/02/2009   Hyperlipidemia associated with type 2 diabetes mellitus ([place]) 05/11/2009   GOUT 02/05/2009   Uncontrolled type 2 diabetes mellitus with stage 3 chronic kidney disease, with long-term current use of insulin    Essential hypertension 04/18/2007  LBBB (left bundle branch block) 04/18/2007   ALLERGIC RHINITIS 04/18/2007    Immunization History  Administered Date(s) Administered   PPD Test 08/29/2019   Td 10/02/1993,  01/06/2008    Conditions to be addressed/monitored:  Hypertension, Hyperlipidemia, Diabetes, Heart Failure, and Coronary Artery Disease   Care Plan : [place]  Updates made by Charlton Haws, [place] since 08/10/2022 12:00 AM     Problem: Hypertension, Hyperlipidemia, Diabetes, Heart Failure, and Coronary Artery Disease   Priority: High     Long-Range Goal: Disease mgmt   Start Date: 12/15/2021  Expected End Date: 12/16/2022  This Visit's Progress: On track  Recent Progress: On track  Priority: High  Note:   Current Barriers:  Unable to achieve control of diabetes and cholesterol  Pharmacist Clinical Goal(s):  Patient will contact provider office for questions/concerns as evidenced notation of same in electronic health record through collaboration with PharmD and provider.   Interventions: 1:1 collaboration with Owens Loffler, [name] regarding development and update of comprehensive plan of care as evidenced by provider attestation and co-signature Inter-disciplinary care team collaboration (see longitudinal plan of care) Comprehensive medication review performed; medication list updated in electronic medical record  Hyperlipidemia/CAD: (LDL goal < 70) -Uncontrolled - LDL 187 (11/2021). LDL previously <130 in 2011, jumped to 195 in 2015. She has not been able to tolerate any cholesterol medication. -Hx CAD (single-vessel disease, medical mgmt); hx stroke x2 per patient -Current treatment: None -Medications previously tried: atorvastatin 80, rosuvastatin 5-10, pravastatin 10, Zetia, Repatha (felt awful x 2 wks) -Educated on Cholesterol goals; discussed majority of cholesterol made endogenously and most medications target this; discussed Praluent, Nexletol and Leqvio - last remaining options for LDL lowering; she would be a candidate for Leqvio but would need to discuss with cardiology/lipid clinic  Hypertension / Heart Failure (BP goal <140/90) -Controlled - BP at  goal at home -Current home readings: 138/70, HR 80 -LVEF: 55-60% (05/11/20) - improved, previously < 40% -HF type: HFimpEF; NYHA class I symptoms -Follows with cardiology (Dr Kristy Situ) -Current treatment: Carvedilol 12.5 mg BID - Appropriate, Effective, Safe, Accessible Entresto 49-51 mg BID -Appropriate, Effective, Safe, Accessible -Medications previously tried: spironolactone, ivabradine -Counseled to monitor BP at home at least once monthly, document, and provide log at future appointments -Recommended to continue current medication  Diabetes (A1c goal <8%) -Uncontrolled, improved - A1c 9.7% (06/2022), improved from 11.2% after receiving Humulin R U-500 in vials instead of pens, for some reason the Vial insulin is working better -Current home glucose readings - via FL2. Using reader device.  -Average glucose: 217 (90 days) -DM history: Diagnosed with type 2 diabetes 1998, transitioned to insulin 2005. Multiple episodes of DKA. Pt has seen multiple endocrinologist and frequently lost to follow up. She has been referred to Endocrine [facility] Endo on [address]) and has appt in Jan 2024 to establish care -Current medications: Levemir vial 25 units BID -  Query appropriate Humulin R U-500 - 65 units (13u w/ U-100 syringe) 2-3x daily w/ meals (PAP)- Appropriate, Query Effective Freestyle Libre 2 ([place]) - Appropriate, Effective, Safe, Accessible -Medications previously tried: Lantus (pt reports ineffective); rosiglitazone (HF-pt insists this is the reason she developed HF), glimepiride (hypoglycemia); metformin (unknown); Invokana (increased thirst); Bydureon (cost/difficult administration) -Previously discussed it is typically inappropriate to take another type of insulin with Humulin R U-500; pt insists Levemir has helped "even out" her sugars and prevent low sugars -Previously discussed possibility of starting GLP-1 RA, pt does not want to start new  medication now but will think about it for  future -Reviewed difference between CGM sensor glucose and blood glucose -Recommend to continue current medication  Patient Goals/Self-Care Activities Patient will:  - take medications as prescribed - check glucose daily before meals, 2 hours after meals, and bedtime, document, and provide at future appointments - check blood pressure daily, document, and provide at future appointments      Medication Assistance:  [place] Cares - Humulin R U-500 (approved through 10/01/2022)  Compliance/Adherence/Medication fill history: Care Gaps: DEXA scan  Star-Rating Drugs: None  Medication Access: Within the past 30 days, how often has patient missed a dose of medication? 0 Is a pillbox or other method used to improve adherence? Yes  Factors that may affect medication adherence? adverse effects of medications Are meds synced by current pharmacy? No  Are meds delivered by current pharmacy? No  Does patient experience delays in picking up medications due to transportation concerns? No   [facility]stream Services Reviewed: Is patient disadvantaged to use [facility]?: No  Current Rx insurance plan: [employer] [place] Name and location of Current pharmacy:  [place], [place] [address] [place] [address] Phone: [phone] Fax: [phone]  [facility] Pharm[facility] services reviewed with patient today?: No  Patient requests to transfer care to [facility] Pharmacy?: No  Reason patient declined to change pharmacies: Loyalty to other pharmacy/Patient preference   Care Plan and Follow Up Patient Decision:  Patient agrees to Care Plan and Follow-up.  Follow Up Plan: Telephone follow up appointment with care management team member scheduled for: 3 months  [name], PharmD, BCACP Clinical Pharmacist [facility] at [facility] [phone]

## 2022-08-10 DIAGNOSIS — E113411 Type 2 diabetes mellitus with severe nonproliferative diabetic retinopathy with macular edema, right eye: Secondary | ICD-10-CM | POA: Diagnosis not present

## 2022-08-10 NOTE — Patient Instructions (Signed)
Visit Information  Phone number for Pharmacist: [phone]   Goals Addressed   None     Care Plan : [place]  Updates made by Charlton Haws, RPH since 08/10/2022 12:00 AM     Problem: Hypertension, Hyperlipidemia, Diabetes, Heart Failure, and Coronary Artery Disease   Priority: High     Long-Range Goal: Disease mgmt   Start Date: 12/15/2021  Expected End Date: 12/16/2022  This Visit's Progress: On track  Recent Progress: On track  Priority: High  Note:   Current Barriers:  Unable to achieve control of diabetes and cholesterol  Pharmacist Clinical Goal(s):  Patient will contact provider office for questions/concerns as evidenced notation of same in electronic health record through collaboration with PharmD and provider.   Interventions: 1:1 collaboration with Owens Loffler, MD regarding development and update of comprehensive plan of care as evidenced by provider attestation and co-signature Inter-disciplinary care team collaboration (see longitudinal plan of care) Comprehensive medication review performed; medication list updated in electronic medical record  Hyperlipidemia/CAD: (LDL goal < 70) -Uncontrolled - LDL 187 (11/2021). LDL previously <130 in 2011, jumped to 195 in 2015. She has not been able to tolerate any cholesterol medication. -Hx CAD (single-vessel disease, medical mgmt); hx stroke x2 per patient -Current treatment: None -Medications previously tried: atorvastatin 80, rosuvastatin 5-10, pravastatin 10, Zetia, Repatha (felt awful x 2 wks) -Educated on Cholesterol goals; discussed majority of cholesterol made endogenously and most medications target this; discussed Praluent, Nexletol and Leqvio - last remaining options for LDL lowering; she would be a candidate for Leqvio but would need to discuss with cardiology/lipid clinic  Hypertension / Heart Failure (BP goal <140/90) -Controlled - BP at goal at home -Current home readings: 138/70,  HR 80 -LVEF: 55-60% (05/11/20) - improved, previously < 40% -HF type: HFimpEF; NYHA class I symptoms -Follows with cardiology (Dr Rockey Situ) -Current treatment: Carvedilol 12.5 mg BID - Appropriate, Effective, Safe, Accessible Entresto 49-51 mg BID -Appropriate, Effective, Safe, Accessible -Medications previously tried: spironolactone, ivabradine -Counseled to monitor BP at home at least once monthly, document, and provide log at future appointments -Recommended to continue current medication  Diabetes (A1c goal <8%) -Uncontrolled, improved - A1c 9.7% (06/2022), improved from 11.2% after receiving Humulin R U-500 in vials instead of pens, for some reason the Vial insulin is working better -Current home glucose readings - via FL2. Using reader device.  -Average glucose: 217 (90 days) -DM history: Diagnosed with type 2 diabetes 1998, transitioned to insulin 2005. Multiple episodes of DKA. Pt has seen multiple endocrinologist and frequently lost to follow up. She has been referred to Endocrine [facility] Endo on [address]) and has appt in Jan 2024 to establish care -Current medications: Levemir vial 25 units BID -  Query appropriate Humulin R U-500 - 65 units (13u w/ U-100 syringe) 2-3x daily w/ meals (PAP)- Appropriate, Query Effective Freestyle Libre 2 ([place]) - Appropriate, Effective, Safe, Accessible -Medications previously tried: Lantus (pt reports ineffective); rosiglitazone (HF-pt insists this is the reason she developed HF), glimepiride (hypoglycemia); metformin (unknown); Invokana (increased thirst); Bydureon (cost/difficult administration) -Previously discussed it is typically inappropriate to take another type of insulin with Humulin R U-500; pt insists Levemir has helped "even out" her sugars and prevent low sugars -Previously discussed possibility of starting GLP-1 RA, pt does not want to start new medication now but will think about it for future -Reviewed difference between CGM sensor glucose and  blood glucose -Recommend to continue current medication  Patient Goals/Self-Care Activities Patient will:  -  take medications as prescribed - check glucose daily before meals, 2 hours after meals, and bedtime, document, and provide at future appointments - check blood pressure daily, document, and provide at future appointments      Patient verbalizes understanding of instructions and care plan provided today and agrees to view in [place]. Active MyChart status and patient understanding of how to access instructions and care plan via MyChart confirmed with patient.    Telephone follow up appointment with pharmacy team member scheduled for: 3 months  [name], PharmD, [facility] Clinical Pharmacist [place] [facility] at [facility] [phone]

## 2022-08-11 ENCOUNTER — Other Ambulatory Visit: Payer: Self-pay | Admitting: Cardiovascular Disease

## 2022-08-11 DIAGNOSIS — E1165 Type 2 diabetes mellitus with hyperglycemia: Secondary | ICD-10-CM | POA: Diagnosis not present

## 2022-08-11 DIAGNOSIS — Z794 Long term (current) use of insulin: Secondary | ICD-10-CM | POA: Diagnosis not present

## 2022-08-14 DIAGNOSIS — E113493 Type 2 diabetes mellitus with severe nonproliferative diabetic retinopathy without macular edema, bilateral: Secondary | ICD-10-CM | POA: Diagnosis not present

## 2022-08-31 ENCOUNTER — Ambulatory Visit: Payer: Medicare HMO | Admitting: Dermatology

## 2022-08-31 VITALS — BP 127/69 | HR 73

## 2022-08-31 DIAGNOSIS — L821 Other seborrheic keratosis: Secondary | ICD-10-CM | POA: Diagnosis not present

## 2022-08-31 DIAGNOSIS — L219 Seborrheic dermatitis, unspecified: Secondary | ICD-10-CM

## 2022-08-31 DIAGNOSIS — L578 Other skin changes due to chronic exposure to nonionizing radiation: Secondary | ICD-10-CM

## 2022-08-31 DIAGNOSIS — L820 Inflamed seborrheic keratosis: Secondary | ICD-10-CM | POA: Diagnosis not present

## 2022-08-31 DIAGNOSIS — Z79899 Other long term (current) drug therapy: Secondary | ICD-10-CM | POA: Diagnosis not present

## 2022-08-31 DIAGNOSIS — L570 Actinic keratosis: Secondary | ICD-10-CM

## 2022-08-31 MED ORDER — KETOCONAZOLE 2 % EX CREA: 1.0000 | TOPICAL_CREAM | Freq: Every day | CUTANEOUS | 11 refills | Status: DC

## 2022-08-31 MED ORDER — HYDROCORTISONE 2.5 % EX CREA: TOPICAL_CREAM | Freq: Every day | CUTANEOUS | 11 refills | Status: DC

## 2022-08-31 NOTE — Patient Instructions (Addendum)
Start Ketoconazole 2% cream 3 nights a week, Monday, Wednesday and Fridays Start Hydrocortisone 2.5% cream/lotion 3 nights a week, Tuesday, Thursday and Saturdays    Due to recent changes in healthcare laws, you may see results of your pathology and/or laboratory studies on MyChart before the doctors have had a chance to review them. We understand that in some cases there may be results that are confusing or concerning to you. Please understand that not all results are received at the same time and often the doctors may need to interpret multiple results in order to provide you with the best plan of care or course of treatment. Therefore, we ask that you please give [place] 2 business days to thoroughly review all your results before contacting the office for clarification. Should we see a critical lab result, you will be contacted sooner.   If You Need Anything After Your Visit  If you have any questions or concerns for your doctor, please call our main line at [phone] and press option 4 to reach your doctor's medical assistant. If no one answers, please leave a voicemail as directed and we will return your call as soon as possible. Messages left after 4 pm will be answered the following business day.   You may also send [place] a message via [place]. We typically respond to MyChart messages within 1-2 business days.  For prescription refills, please ask your pharmacy to contact our office. Our fax number is [phone].  If you have an urgent issue when the clinic is closed that cannot wait until the next business day, you can page your doctor at the number below.    Please note that while we do our best to be available for urgent issues outside of office hours, we are not available 24/7.   If you have an urgent issue and are unable to reach [place], you may choose to seek medical care at your doctor's office, retail clinic, urgent care center, or emergency room.  If you have a medical emergency,  please immediately call 911 or go to the emergency department.  Pager Numbers  - Dr. Nehemiah Massed: [phone]  - Dr. Laurence Ferrari: [phone]  - Dr. Nicole Kindred: [phone]  In the event of inclement weather, please call our main line at [phone] for an update on the status of any delays or closures.  Dermatology Medication Tips: Please keep the boxes that topical medications come in in order to help keep track of the instructions about where and how to use these. Pharmacies typically print the medication instructions only on the boxes and not directly on the medication tubes.   If your medication is too expensive, please contact our office at [phone] option 4 or send [place] a message through [place].   We are unable to tell what your co-pay for medications will be in advance as this is different depending on your insurance coverage. However, we may be able to find a substitute medication at lower cost or fill out paperwork to get insurance to cover a needed medication.   If a prior authorization is required to get your medication covered by your insurance company, please allow [place] 1-2 business days to complete this process.  Drug prices often vary depending on where the prescription is filled and some pharmacies may offer cheaper prices.  The website www.goodrx.com contains coupons for medications through different pharmacies. The prices here do not account for what the cost may be with help from insurance (it may be cheaper with your insurance),  but the website can give you the price if you did not use any insurance.  - You can print the associated coupon and take it with your prescription to the pharmacy.  - You may also stop by our office during regular business hours and pick up a GoodRx coupon card.  - If you need your prescription sent electronically to a different pharmacy, notify our office through [facility] or by phone at [phone] option 4.     Si Usted Necesita Algo  Despus de Su Visita  Tambin puede enviarnos un mensaje a travs de Pharmacist, community. Por lo general respondemos a los mensajes de MyChart en el transcurso de 1 a 2 das hbiles.  Para renovar recetas, por favor pida a su farmacia que se ponga en contacto con nuestra oficina. Harland Dingwall de fax es [place] [phone].  Si tiene un asunto urgente cuando la clnica est cerrada y que no puede esperar hasta el siguiente da hbil, puede llamar/localizar a su doctor(a) al nmero que aparece a continuacin.   Por favor, tenga en cuenta que aunque hacemos todo lo posible para estar disponibles para asuntos urgentes fuera del horario de [place], no estamos disponibles las 24 horas del da, los 7 das de la [place].   Si tiene un problema urgente y no puede comunicarse con nosotros, puede optar por buscar atencin mdica  en el consultorio de su doctor(a), en una clnica privada, en un centro de atencin urgente o en una sala de emergencias.  Si tiene Engineering geologist, por favor llame inmediatamente al 911 o vaya a la sala de emergencias.  Nmeros de bper  - Dr. Nehemiah Massed: [phone]  - Dra. Moye: [phone]  - Dra. Nicole Kindred: [phone]  En caso de inclemencias del [place], por favor llame a Johnsie Kindred principal al [phone] para una actualizacin sobre el [place] de cualquier retraso o cierre.  Consejos para la medicacin en dermatologa: Por favor, guarde las cajas en las que vienen los medicamentos de uso tpico para ayudarle a seguir las instrucciones sobre dnde y cmo usarlos. Las farmacias generalmente imprimen las instrucciones del medicamento slo en las cajas y no directamente en los tubos del [place].   Si su medicamento es muy caro, por favor, pngase en contacto con Zigmund Daniel llamando al [phone] y presione la opcin 4 o envenos un mensaje a travs de Pharmacist, community.   No podemos decirle cul ser su copago por los medicamentos por adelantado ya que esto es diferente  dependiendo de la cobertura de su seguro. Sin embargo, es posible que podamos encontrar un medicamento sustituto a Electrical engineer un formulario para que el seguro cubra el medicamento que se considera necesario.   Si se requiere una autorizacin previa para que su compaa de seguros [place] su medicamento, por favor permtanos de 1 a 2 das hbiles para completar este proceso.  Los precios de los medicamentos varan con frecuencia dependiendo del Environmental consultant de dnde se surte la receta y alguna farmacias pueden ofrecer precios ms baratos.  El sitio web www.goodrx.com tiene cupones para medicamentos de Airline pilot. Los precios aqu no tienen en cuenta lo que podra costar con la ayuda del seguro (puede ser ms barato con su seguro), pero el sitio web puede darle el precio si no utiliz Research scientist (physical sciences).  - Puede imprimir el cupn correspondiente y llevarlo con su receta a la farmacia.  - Tambin puede pasar por nuestra oficina durante el horario de atencin regular y Charity fundraiser una tarjeta de cupones  de GoodRx.  - Si necesita que su receta se enve electrnicamente a una farmacia diferente, informe a nuestra oficina a travs de MyChart de [place] o por telfono llamando al [phone] y presione la opcin 4.

## 2022-08-31 NOTE — Progress Notes (Signed)
Follow-Up Visit   Subjective  Kristy [name] is a 71 y.o. female who presents for the following: Follow-up (ISK follow up of right arm treated with LN2 x 30) and Other (Itchy spots of forehead). The patient has spots, moles and lesions to be evaluated, some may be new or changing and the patient has concerns that these could be cancer.  The following portions of the chart were reviewed this encounter and updated as appropriate:   Tobacco  Allergies  Meds  Problems  Med Hx  Surg Hx  Fam Hx     Review of Systems:  No other skin or systemic complaints except as noted in HPI or Assessment and Plan.  Objective  Well appearing patient in no apparent distress; mood and affect are within normal limits.  A focused examination was performed including scalp, face, arms. Relevant physical exam findings are noted in the Assessment and Plan.  Right cheek x 1, left forehead x 1, scalp x 1, left forearm x 21 (24) Erythematous stuck-on, waxy papule or plaque  Nose x 2 (2) Erythematous thin papules/macules with gritty scale.   Glabella and brow area Pinkness and scale   Assessment & Plan   Actinic Damage - chronic, secondary to cumulative UV radiation exposure/sun exposure over time - diffuse scaly erythematous macules with underlying dyspigmentation - Recommend daily broad spectrum sunscreen SPF 30+ to sun-exposed areas, reapply every 2 hours as needed.  - Recommend staying in the shade or wearing long sleeves, sun glasses (UVA+UVB protection) and wide brim hats (4-inch brim around the entire circumference of the hat). - Call for new or changing lesions.  Seborrheic Keratoses - Stuck-on, waxy, tan-brown papules and/or plaques  - Benign-appearing - Discussed benign etiology and prognosis. - Observe - Call for any changes  Inflamed seborrheic keratosis (24) Right cheek x 1, left forehead x 1, scalp x 1, left forearm x 21  Destruction of lesion - Right cheek x 1, left forehead  x 1, scalp x 1, left forearm x 21 Complexity: simple   Destruction method: cryotherapy   Informed consent: discussed and consent obtained   Timeout:  patient name, date of birth, surgical site, and procedure verified Lesion destroyed using liquid nitrogen: Yes   Region frozen until ice ball extended beyond lesion: Yes   Outcome: patient tolerated procedure well with no complications   Post-procedure details: wound care instructions given    AK (actinic keratosis) (2) Nose x 2  Destruction of lesion - Nose x 2 Complexity: simple   Destruction method: cryotherapy   Informed consent: discussed and consent obtained   Timeout:  patient name, date of birth, surgical site, and procedure verified Lesion destroyed using liquid nitrogen: Yes   Region frozen until ice ball extended beyond lesion: Yes   Outcome: patient tolerated procedure well with no complications   Post-procedure details: wound care instructions given    Seborrheic dermatitis Glabella and brow area  Start Ketoconazole 2% cream 3 nights a week, Monday, Wednesday and Fridays Start Hydrocortisone 2.5% cream/lotion 3 nights a week, Tuesday, Thursday and Saturdays   ketoconazole (NIZORAL) 2 % cream - Glabella and brow area Apply 1 Application topically daily. Monday, Wednesday, Friday  hydrocortisone 2.5 % cream - Glabella and brow area Apply topically daily. Tuesday, Thursday, Saturday   Return for ISK follow up 3-4 months.  I, Ashok Cordia, CMA, am acting as scribe for Sarina Ser, MD . Documentation: I have reviewed the above documentation for accuracy and completeness, and I  agree with the above.  Sarina Ser, MD

## 2022-09-07 DIAGNOSIS — E113411 Type 2 diabetes mellitus with severe nonproliferative diabetic retinopathy with macular edema, right eye: Secondary | ICD-10-CM | POA: Diagnosis not present

## 2022-09-10 DIAGNOSIS — Z794 Long term (current) use of insulin: Secondary | ICD-10-CM | POA: Diagnosis not present

## 2022-09-10 DIAGNOSIS — E1165 Type 2 diabetes mellitus with hyperglycemia: Secondary | ICD-10-CM | POA: Diagnosis not present

## 2022-09-11 ENCOUNTER — Telehealth: Payer: Self-pay

## 2022-09-11 NOTE — Progress Notes (Cosign Needed Addendum)
    Chronic Care Management Pharmacy Assistant   Name: Kristy [name]  MRN: [id number] DOB: July 07, 1951  Patient assistance forms for Humulin R with [place] for 2024 has been completed and uploaded.  [name], CPP notified  [name], [place] Clinical Pharmacy Assistant [phone]

## 2022-09-14 ENCOUNTER — Encounter: Payer: Self-pay | Admitting: Dermatology

## 2022-09-14 DIAGNOSIS — E113412 Type 2 diabetes mellitus with severe nonproliferative diabetic retinopathy with macular edema, left eye: Secondary | ICD-10-CM | POA: Diagnosis not present

## 2022-09-14 NOTE — Telephone Encounter (Signed)
Forms printed and place in front office.

## 2022-09-15 NOTE — Telephone Encounter (Signed)
Patient has been made aware and will come by to sign them. Patient was made aware to bring income documentation.   [name], CPP notified  [name], [place] Clinical Pharmacy Assistant [phone]

## 2022-09-18 NOTE — Telephone Encounter (Signed)
Patient came by to sign documents and left copy of income. Placed in [place] box.

## 2022-09-21 ENCOUNTER — Ambulatory Visit: Payer: Medicare HMO | Admitting: Podiatry

## 2022-09-21 DIAGNOSIS — E0865 Diabetes mellitus due to underlying condition with hyperglycemia: Secondary | ICD-10-CM

## 2022-09-21 DIAGNOSIS — B351 Tinea unguium: Secondary | ICD-10-CM | POA: Insufficient documentation

## 2022-09-21 DIAGNOSIS — M79674 Pain in right toe(s): Secondary | ICD-10-CM

## 2022-09-21 DIAGNOSIS — M79675 Pain in left toe(s): Secondary | ICD-10-CM

## 2022-09-21 NOTE — Progress Notes (Signed)
This patient returns to my office for at risk foot care.  This patient requires this care by a professional since this patient will be at risk due to having diabetes and kidney disease.  This patient is unable to cut nails herself since the patient cannot reach her nails.These nails are painful walking and wearing shoes.  This patient presents for at risk foot care today.  General Appearance  Alert, conversant and in no acute stress.  Vascular  Dorsalis pedis and posterior tibial  pulses are palpable  bilaterally.  Capillary return is within normal limits  bilaterally. Temperature is within normal limits  bilaterally.  Neurologic  Senn-Weinstein monofilament wire test within normal limits  bilaterally. Muscle power within normal limits bilaterally.  Nails  No evidence of bacterial infection or drainage bilaterally.  Pincer hallux nails.  Orthopedic  No limitations of motion  feet .  No crepitus or effusions noted.  No bony pathology or digital deformities noted.  Skin  normotropic skin with no porokeratosis noted bilaterally.  No signs of infections or ulcers noted.     Onychomycosis  Pain in right toes  Pain in left toes  Consent was obtained for treatment procedures.   Mechanical debridement of nails 1-5  bilaterally performed with a nail nipper.  Filed with dremel without incident.    Return office visit  3 months                    Told patient to return for periodic foot care and evaluation due to potential at risk complications.   Gardiner Barefoot DPM

## 2022-10-09 DIAGNOSIS — E113411 Type 2 diabetes mellitus with severe nonproliferative diabetic retinopathy with macular edema, right eye: Secondary | ICD-10-CM | POA: Diagnosis not present

## 2022-10-09 DIAGNOSIS — E113412 Type 2 diabetes mellitus with severe nonproliferative diabetic retinopathy with macular edema, left eye: Secondary | ICD-10-CM | POA: Diagnosis not present

## 2022-10-10 DIAGNOSIS — E1165 Type 2 diabetes mellitus with hyperglycemia: Secondary | ICD-10-CM | POA: Diagnosis not present

## 2022-10-10 DIAGNOSIS — E113411 Type 2 diabetes mellitus with severe nonproliferative diabetic retinopathy with macular edema, right eye: Secondary | ICD-10-CM | POA: Diagnosis not present

## 2022-10-10 DIAGNOSIS — Z794 Long term (current) use of insulin: Secondary | ICD-10-CM | POA: Diagnosis not present

## 2022-10-20 DIAGNOSIS — E113412 Type 2 diabetes mellitus with severe nonproliferative diabetic retinopathy with macular edema, left eye: Secondary | ICD-10-CM | POA: Diagnosis not present

## 2022-10-23 ENCOUNTER — Encounter: Payer: Self-pay | Admitting: Podiatry

## 2022-10-23 DIAGNOSIS — E1159 Type 2 diabetes mellitus with other circulatory complications: Secondary | ICD-10-CM | POA: Diagnosis not present

## 2022-10-23 DIAGNOSIS — Z87891 Personal history of nicotine dependence: Secondary | ICD-10-CM | POA: Diagnosis not present

## 2022-10-23 DIAGNOSIS — E11319 Type 2 diabetes mellitus with unspecified diabetic retinopathy without macular edema: Secondary | ICD-10-CM | POA: Diagnosis not present

## 2022-10-23 DIAGNOSIS — E1129 Type 2 diabetes mellitus with other diabetic kidney complication: Secondary | ICD-10-CM | POA: Diagnosis not present

## 2022-10-23 DIAGNOSIS — Z794 Long term (current) use of insulin: Secondary | ICD-10-CM | POA: Diagnosis not present

## 2022-10-23 DIAGNOSIS — K3184 Gastroparesis: Secondary | ICD-10-CM | POA: Diagnosis not present

## 2022-10-23 DIAGNOSIS — E782 Mixed hyperlipidemia: Secondary | ICD-10-CM | POA: Diagnosis not present

## 2022-10-23 DIAGNOSIS — R809 Proteinuria, unspecified: Secondary | ICD-10-CM | POA: Diagnosis not present

## 2022-10-23 DIAGNOSIS — E1143 Type 2 diabetes mellitus with diabetic autonomic (poly)neuropathy: Secondary | ICD-10-CM | POA: Diagnosis not present

## 2022-10-23 DIAGNOSIS — I2510 Atherosclerotic heart disease of native coronary artery without angina pectoris: Secondary | ICD-10-CM | POA: Diagnosis not present

## 2022-10-23 DIAGNOSIS — E1140 Type 2 diabetes mellitus with diabetic neuropathy, unspecified: Secondary | ICD-10-CM | POA: Diagnosis not present

## 2022-10-23 DIAGNOSIS — E1165 Type 2 diabetes mellitus with hyperglycemia: Secondary | ICD-10-CM | POA: Diagnosis not present

## 2022-10-25 ENCOUNTER — Telehealth: Payer: Self-pay | Admitting: Podiatry

## 2022-10-25 NOTE — Telephone Encounter (Signed)
Patient came in today asking for a refill of compound cream that Dr Amalia Hailey wrote for her in 2022. Patient uses [facility] in [place]. Patients tele number is [phone].

## 2022-11-06 DIAGNOSIS — E113411 Type 2 diabetes mellitus with severe nonproliferative diabetic retinopathy with macular edema, right eye: Secondary | ICD-10-CM | POA: Diagnosis not present

## 2022-11-06 DIAGNOSIS — E113412 Type 2 diabetes mellitus with severe nonproliferative diabetic retinopathy with macular edema, left eye: Secondary | ICD-10-CM | POA: Diagnosis not present

## 2022-11-09 DIAGNOSIS — E1165 Type 2 diabetes mellitus with hyperglycemia: Secondary | ICD-10-CM | POA: Diagnosis not present

## 2022-11-09 DIAGNOSIS — Z794 Long term (current) use of insulin: Secondary | ICD-10-CM | POA: Diagnosis not present

## 2022-11-14 ENCOUNTER — Other Ambulatory Visit: Payer: Self-pay | Admitting: Cardiovascular Disease

## 2022-11-15 NOTE — Telephone Encounter (Signed)
Please schedule 6 month F/U appointment. Thank you! 

## 2022-11-17 DIAGNOSIS — E113412 Type 2 diabetes mellitus with severe nonproliferative diabetic retinopathy with macular edema, left eye: Secondary | ICD-10-CM | POA: Diagnosis not present

## 2022-11-17 DIAGNOSIS — E113411 Type 2 diabetes mellitus with severe nonproliferative diabetic retinopathy with macular edema, right eye: Secondary | ICD-10-CM | POA: Diagnosis not present

## 2022-11-20 NOTE — Telephone Encounter (Signed)
Scheduled 03/25

## 2022-11-28 ENCOUNTER — Telehealth: Payer: Self-pay | Admitting: Pharmacist

## 2022-11-28 NOTE — Telephone Encounter (Signed)
Received message from [facility] PAP) requesting clarification for Humulin R U-500. They state U-500 is indicated for total daily dose above 200 units and would be off label for this patient.  Reviewed chart, pt had appt with endocrine 10/23/22 and reports use of U-500 with SIG 75 units 2-3x daily. Total daily dose = 225 units/day. [place] this updated information and requested continued use of U-500.

## 2022-12-01 ENCOUNTER — Telehealth: Payer: Self-pay

## 2022-12-01 NOTE — Progress Notes (Signed)
Care Management & Coordination Services Pharmacy Team  Reason for Encounter: Appointment Reminder  Contacted patient to confirm in office appointment with [name], PharmD on 12/06/2022 at 2:30.  Unsuccessful outreach. Left voicemail for patient to return call.  Star Rating Drugs:  Medication:    Last Fill: Day Supply Sacubitril-Valsartan 49-51 mg 11/14/22 30  Care Gaps: Annual wellness visit in last year? Yes 12/05/2021  If Diabetic: Last eye exam / retinopathy screening: Overdue Last diabetic foot exam: Up to date  [name], PharmD notified  [name], [place] Assistant [phone]

## 2022-12-05 ENCOUNTER — Encounter: Payer: Self-pay | Admitting: Pharmacist

## 2022-12-05 DIAGNOSIS — G720 Drug-induced myopathy: Secondary | ICD-10-CM | POA: Insufficient documentation

## 2022-12-05 NOTE — Progress Notes (Signed)
Kristy [name] T. Brenda Samano, MD, [place] [facility] [place], [address]  Phone: [phone]  FAX: [place] - 72 y.o. female  MRN [id number]  Date of Birth: 28-Nov-1950  Date: 12/06/2022  PCP: Owens Loffler, MD  Referral: Owens Loffler, MD  No chief complaint on file.  Patient Care Team: Owens Loffler, MD as PCP - General (Family Medicine) Rockey Situ Kathlene November, MD as PCP - Cardiology (Cardiology) Minna Merritts, MD as Consulting Physician (Cardiology) Charlton Haws, [facility] as Pharmacist (Pharmacist) Subjective:   CHONA MOOTHART is a 72 y.o. pleasant patient who presents with the following:  Health Maintenance Summary Reviewed and updated, unless pt declines services.  Tobacco History Reviewed. Non-smoker Alcohol: No concerns, no excessive use Exercise Habits: Some activity, rec at least 30 mins 5 times a week STD concerns: none Drug Use: None Lumps or breast concerns: no  Shingrix Covid Tdap Mammo Eye exam DEXA  Labs and microalbumin    Lipids: Uncontrolled, she will not take a statin or one of the injectable cholesterol medications. Panel reviewed with patient.  Lipids: Lab Results  Component Value Date   CHOL 261 (H) 12/05/2021   Lab Results  Component Value Date   HDL 34.30 (L) 12/05/2021   Lab Results  Component Value Date   LDLCALC 103 (H) 04/22/2019   Lab Results  Component Value Date   TRIG 205.0 (H) 12/05/2021   Lab Results  Component Value Date   CHOLHDL 8 12/05/2021    Lab Results  Component Value Date   ALT 17 12/05/2021   AST 21 12/05/2021   ALKPHOS 69 12/05/2021   BILITOT 0.6 12/05/2021    HTN: Tolerating all medications without side effects Stable and at goal No CP, no sob. No HA.  BP Readings from Last 3 Encounters:  08/31/22 127/69  06/07/22 110/60  06/06/22 [id number]    Basic Metabolic Panel:    Component Value Date/Time    NA 141 12/05/2021 1035   NA 136 04/08/2020 1447   K 4.3 12/05/2021 1035   CL 103 12/05/2021 1035   CO2 28 12/05/2021 1035   BUN 19 12/05/2021 1035   BUN 16 04/08/2020 1447   CREATININE 1.07 12/05/2021 1035   CREATININE 0.72 07/27/2014 0902   GLUCOSE 169 (H) 12/05/2021 1035   CALCIUM 10.5 12/05/2021 1035    Diabetes Mellitus: Tolerating Medications: yes Compliance with diet: fair, There is no height or weight on file to calculate BMI. Exercise: minimal / intermittent Avg blood sugars at home: not checking Foot problems: none Hypoglycemia: none No nausea, vomitting, blurred vision, polyuria.  10/23/2022 A1c is 9.9.  Lab Results  Component Value Date   HGBA1C 9.7 (A) 06/07/2022   HGBA1C 9.4 (H) 12/05/2021   HGBA1C 11.8 (A) 04/13/2021   Lab Results  Component Value Date   MICROALBUR 18.1 (H) 12/10/2018   LDLCALC 103 (H) 04/22/2019   CREATININE 1.07 12/05/2021    Wt Readings from Last 3 Encounters:  06/07/22 170 lb 2 oz (77.2 kg)  06/06/22 169 lb 4 oz (76.8 kg)  03/01/22 172 lb 4 oz (78.1 kg)     Health Maintenance  Topic Date Due   COVID-19 Vaccine (1) Never done   Zoster Vaccines- Shingrix (1 of 2) Never done   DEXA SCAN  Never done   DTaP/Tdap/Td (3 - Tdap) 01/05/2018   Diabetic kidney evaluation - Urine ACR  12/10/2019   MAMMOGRAM  02/22/2022  OPHTHALMOLOGY EXAM  04/11/2022   Diabetic kidney evaluation - eGFR measurement  12/06/2022   Medicare Annual Wellness (AWV)  12/06/2022   Pneumonia Vaccine 54+ Years old (1 of 2 - PCV) 12/06/2022 (Originally 06/06/1957)   INFLUENZA VACCINE  12/31/2022 (Originally 05/02/2022)   HEMOGLOBIN A1C  12/06/2022   FOOT EXAM  09/22/2023   COLONOSCOPY (Pts 45-32yr Insurance coverage will need to be confirmed)  10/13/2027   Hepatitis C Screening  Completed   HPV VACCINES  Aged Out    Immunization History  Administered Date(s) Administered   PPD Test 08/29/2019   Td 10/02/1993, 01/06/2008   Patient Active Problem List    Diagnosis Date Noted   Advanced care planning/counseling discussion: DNR / DNI 10/04/2020    Priority: High   Patient has active power of attorney for health care: Todd Peeler 10/04/2020    Priority: High   NICM (nonischemic cardiomyopathy) ([place] 11/08/2018    Priority: High   Chronic systolic CHF (congestive heart failure) ([facility] 08/22/2018    Priority: High   Coronary artery disease, non-occlusive 09/13/2016    Priority: High   Uncontrolled type 2 diabetes mellitus with stage 3 chronic kidney disease, with long-term current use of insulin     Priority: High   Crohn's disease ([facility] 10/13/2013    Priority: Medium    Major depressive disorder, recurrent, in full remission ([place] 05/01/2011    Priority: Medium    Hyperlipidemia associated with type 2 diabetes mellitus ([facility] 05/11/2009    Priority: Medium    Pain due to onychomycosis of toenails of both feet 09/21/2022   Statin intolerance 12/06/2021   CKD stage 2 due to type 2 diabetes mellitus ([facility] 10/04/2020   PSORIASIS 08/02/2009   GOUT 02/05/2009   Essential hypertension 04/18/2007   LBBB (left bundle branch block) 04/18/2007   ALLERGIC RHINITIS 04/18/2007    Past Medical History:  Diagnosis Date   Allergic rhinitis    Basal cell carcinoma 08/17/2014   Left nasal ala- Mohs at [facility]   Cataract    mild    Crohn's disease ([facility] 10/13/2013   Diverticulosis    GERD (gastroesophageal reflux disease)    Gout    HFrEF (heart failure with reduced ejection fraction) ([facility]    a. 12/2008 Cath: EF 45% w/ inf HK; b. 07/2009 Echo: EF 50-55%; c. 08/2018 Echo: EF 20-25%.   Hyperlipidemia    Hypertension    IBS (irritable bowel syndrome)    Left bundle branch block    Neuromuscular disorder (HCC)    neuropathy   NICM (nonischemic cardiomyopathy) ([facility]    a. 12/2008 Cath: no significant dzs, EF 45% w/ inf HK->Med Rx; b. 07/2009 Echo: EF 50-55%; c. 08/2018 Echo: EF 20-25%, ant/antsept HK, mild MR, mildly dil LA, nl RV fx; d. 08/2018 Cath:  D1 80, otw nonobs dzs->Med rx.   Non-obstructive CAD (coronary artery disease)    a. 12/2008 Cath: no significant dzs, EF 45% w/ inf HK->Med Rx; b. 08/2018 Cath: LM nl, LAD min irregs, D1 80, LCX 424m, RCA min irregs->Med Rx.   Osteoarthritis    Patient has active power of attorney for health care: ToOretha Caprice1/11/2020   Symptomatic cholelithiasis    Uncontrolled type 2 diabetes mellitus with stage 3 chronic kidney disease, with long-term current use of insulin          Past Surgical History:  Procedure Laterality Date   BREAST BIOPSY Right 06/24/2019   stereo bx/ x clip/ neg   BREAST CYST ASPIRATION  CARDIAC CATHETERIZATION     CATARACT EXTRACTION W/PHACO Right 07/26/2021   Procedure: CATARACT EXTRACTION PHACO AND INTRAOCULAR LENS PLACEMENT ([place]) RIGHT DIABETIC;  Surgeon: Birder Robson, MD;  Location: [place];  Service: Ophthalmology;  Laterality: Right;  Diabetic 8.99 01:10.2   COLONOSCOPY     DILATION AND CURETTAGE OF UTERUS     LEFT HEART CATH AND CORONARY ANGIOGRAPHY N/A 04/24/2019   Procedure: LEFT HEART CATH AND CORONARY ANGIOGRAPHY;  Surgeon: Nelva Bush, MD;  Location: [facility];  Service: Cardiovascular;  Laterality: N/A;   RIGHT/LEFT HEART CATH AND CORONARY ANGIOGRAPHY N/A 08/26/2018   Procedure: RIGHT/LEFT HEART CATH AND CORONARY ANGIOGRAPHY;  Surgeon: Wellington Hampshire, MD;  Location: [place] [facility];  Service: Cardiovascular;  Laterality: N/A;   SHOULDER SURGERY     15 + yrs ago    SKIN SURGERY     nose    VAGINAL HYSTERECTOMY      Family History  Problem Relation Age of Onset   Kidney failure Mother    Hypertension Father    Kidney failure Brother    Colon cancer Neg Hx    Colon polyps Neg Hx    Rectal cancer Neg Hx    Stomach cancer Neg Hx    Breast cancer Neg Hx     Social History   Social History Narrative   Not on file    Past Medical History, Surgical History, Social History, Family History, Problem List,  Medications, and Allergies have been reviewed and updated if relevant.  Review of Systems: Pertinent positives are listed above.  Otherwise, a full 14 point review of systems has been done in full and it is negative except where it is noted positive.  Objective:   There were no vitals taken for this visit. Ideal Body Weight:   No results found.    12/05/2021   10:25 AM 07/31/2019   12:20 PM 02/12/2019   11:13 AM 07/27/2017    9:28 AM 07/16/2017    2:43 PM  Depression screen PHQ 2/9  Decreased Interest 0 0 3 0 0  Down, Depressed, Hopeless 0 2 0 0 0  PHQ - 2 Score 0 2 3 0 0  Altered sleeping  1 0    Tired, decreased energy  1 3    Change in appetite  1 3    Feeling bad or failure about yourself   0 0    Trouble concentrating  0 0    Moving slowly or fidgety/restless  0 2    Suicidal thoughts  0 0    PHQ-9 Score  5 11    Difficult doing work/chores   Not difficult at all       GEN: well developed, well nourished, no acute distress Eyes: conjunctiva and lids normal, PERRLA, EOMI ENT: TM clear, nares clear, oral exam WNL Neck: supple, no lymphadenopathy, no thyromegaly, no JVD Pulm: clear to auscultation and percussion, respiratory effort normal CV: regular rate and rhythm, S1-S2, no murmur, rub or gallop, no bruits Chest: no scars, masses, no lumps BREAST: breast exam declined GI: soft, non-tender; no hepatosplenomegaly, masses; active bowel sounds all quadrants GU: GU exam declined Lymph: no cervical, axillary or inguinal adenopathy MSK: gait normal, muscle tone and strength WNL, no joint swelling, effusions, discoloration, crepitus  SKIN: clear, good turgor, color WNL, no rashes, lesions, or ulcerations Neuro: normal mental status, normal strength, sensation, and motion Psych: alert; oriented to person, place and time, normally interactive and not anxious  or depressed in appearance.   All labs reviewed with patient. Results for orders placed or performed in visit on  06/07/22  POCT glycosylated hemoglobin (Hb A1C)  Result Value Ref Range   Hemoglobin A1C 9.7 (A) 4.0 - 5.6 %   HbA1c POC (<> result, manual entry)     HbA1c, POC (prediabetic range)     HbA1c, POC (controlled diabetic range)     No results found.  Assessment and Plan:     ICD-10-CM   1. Healthcare maintenance  Z00.00       Health Maintenance Exam: The patient's preventative maintenance and recommended screening tests for an annual wellness exam were reviewed in full today. Brought up to date unless services declined.  Counselled on the importance of diet, exercise, and its role in overall health and mortality. The patient's FH and SH was reviewed, including their home life, tobacco status, and drug and alcohol status.  Follow-up in 1 year for physical exam or additional follow-up below.  Disposition: No follow-ups on file.  Future Appointments  Date Time Provider [place]  12/06/2022  1:30 PM [facility] NURSE HEALTH ADVISOR [facility]  12/06/2022  2:00 PM Owens Loffler, MD [facility] PEC  12/06/2022  2:30 PM Charlton Haws, [facility] None  12/07/2022  3:00 PM Ralene Bathe, MD [facility] None  12/25/2022  2:45 PM Idolina Primer, Areta Haber, PA-C [facility] None  01/18/2023  4:15 PM Gardiner Barefoot, DPM [facility]    No orders of the defined types were placed in this encounter.  There are no discontinued medications. No orders of the defined types were placed in this encounter.   Signed,  Maud Deed. Aily Tzeng, MD   Allergies as of 12/06/2022       Reactions   Fish Oil Other (See Comments)   Gout   Glimepiride Other (See Comments)   REACTION: hypoglycemia   Guanfacine Hcl Other (See Comments)   REACTION: unspecified   Other    Splenda caused sugars levels to increase   Rosiglitazone Other (See Comments)   CHF   Shellfish Allergy    Gout        Medication List        Accurate as of December 05, 2022  4:09 PM. If you have any questions, ask your nurse or  doctor.          ALIGN PO Take 1 tablet by mouth daily.   aspirin EC 81 MG tablet Take 1 tablet (81 mg total) by mouth daily.   BD Insulin Syringe U/F 31G X 5/16" 0.5 ML Misc Generic drug: Insulin Syringe-Needle U-100 USE AS DIRECTED   carvedilol 12.5 MG tablet Commonly known as: COREG TAKE 1 TABLET BY MOUTH TWICE DAILY   chlorhexidine 0.12 % solution Commonly known as: PERIDEX RINSE WITH 1 CAPFUL (15ML) FOR 30 SECONDS IN THE MORNING AND IN THE EVENING AFTER TOOTHBRUSHING DO NOT SWALLOW   Ensure Take 237 mLs by mouth daily.   Entresto 49-51 MG Generic drug: sacubitril-valsartan Take 1 tablet by mouth 2 (two) times daily. Please scheduled office visit for further refills. Thank you!   HUMULIN R 500 UNIT/ML injection Generic drug: insulin regular human CONCENTRATED Inject 0.13 mLs (65 Units total) into the skin 3 (three) times daily with meals. Pt is using U-100 needles - dose is 13 units TID as of 09/30/21   hydrocortisone 2.5 % cream Apply topically daily. Tuesday, Thursday, Saturday   ketoconazole 2 % cream Commonly known as: NIZORAL Apply 1 Application  topically daily. Monday, Wednesday, Friday   Levemir 100 UNIT/ML injection Generic drug: insulin detemir INJECT 25 UNITS TOTAL INTO THE SKIN TWICE DAILY AS DIRECTED.   OneTouch Ultra test strip Generic drug: glucose blood USE TO CHECK BLOOD SUGAR UP TO 8 TIMES DAILY

## 2022-12-05 NOTE — Progress Notes (Signed)
[facility]) [place] Team Statin Quality Measure Assessment  12/05/2022  Kristy [name] January 18, 1951 [id number]  Per review of chart and payor information, patient has a diagnosis of diabetes and cardiovascular disease but is not currently filling a statin prescription.  This places patient into the Statin Use In Patients with Diabetes (SUPD) and Statin Use in Patients with Cardiovascular Disease (SPC) measures for CMS.    Documented history trials of multiple satins with statin intolerance in chart.      Component Value Date/Time   CHOL 261 (H) 12/05/2021 1035   TRIG 205.0 (H) 12/05/2021 1035   HDL 34.30 (L) 12/05/2021 1035   CHOLHDL 8 12/05/2021 1035   VLDL 41.0 (H) 12/05/2021 1035   LDLCALC 103 (H) 04/22/2019 0700   LDLDIRECT 187.0 12/05/2021 1035    Please consider ONE of the following recommendations:  Initiate high intensity statin Atorvastatin 40 mg once daily, #90, 3 refills   Rosuvastatin 20 mg once daily, #90, 3 refills    Initiate moderate intensity  statin with reduced frequency if prior  statin intolerance 1x weekly, #13, 3 refills   2x weekly, #26, 3 refills   3x weekly, #39, 3 refills    Code for past statin intolerance  (required annually)  Provider Requirements: Must associate code during an office visit or telehealth encounter   Drug Induced Myopathy G72.0   Myositis, unspecified M60.9   Myopathy, unspecified G72.9   Rhabdomyolysis  M62.82   Myalgia (SPC ONLY) 123XX123   Alcoholic cirrhosis of liver without ascites 0000000   Alcoholic cirrhosis of liver with ascites K70.31   Unspecified cirrhosis of liver K74.60   Toxic liver disease with fibrosis and cirrhosis of liver K71.7   Thank you for allowing [facility] pharmacy to be a part of this patient's care.   [name], PharmD [place] Clinical Pharmacist Direct Dial: [phone]

## 2022-12-06 ENCOUNTER — Encounter: Payer: Self-pay | Admitting: Family Medicine

## 2022-12-06 ENCOUNTER — Telehealth: Payer: Self-pay | Admitting: Family Medicine

## 2022-12-06 ENCOUNTER — Ambulatory Visit: Payer: Medicare HMO | Admitting: Pharmacist

## 2022-12-06 ENCOUNTER — Ambulatory Visit (INDEPENDENT_AMBULATORY_CARE_PROVIDER_SITE_OTHER): Payer: Medicare HMO | Admitting: Family Medicine

## 2022-12-06 ENCOUNTER — Ambulatory Visit (INDEPENDENT_AMBULATORY_CARE_PROVIDER_SITE_OTHER): Payer: Medicare HMO

## 2022-12-06 VITALS — BP 150/90 | HR 83 | Temp 97.8°F | Ht 62.5 in | Wt 175.4 lb

## 2022-12-06 VITALS — Ht 63.5 in | Wt 172.0 lb

## 2022-12-06 DIAGNOSIS — T466X5A Adverse effect of antihyperlipidemic and antiarteriosclerotic drugs, initial encounter: Secondary | ICD-10-CM | POA: Diagnosis not present

## 2022-12-06 DIAGNOSIS — Z0000 Encounter for general adult medical examination without abnormal findings: Secondary | ICD-10-CM | POA: Diagnosis not present

## 2022-12-06 DIAGNOSIS — E1165 Type 2 diabetes mellitus with hyperglycemia: Secondary | ICD-10-CM | POA: Diagnosis not present

## 2022-12-06 DIAGNOSIS — Z79899 Other long term (current) drug therapy: Secondary | ICD-10-CM

## 2022-12-06 DIAGNOSIS — E1169 Type 2 diabetes mellitus with other specified complication: Secondary | ICD-10-CM | POA: Diagnosis not present

## 2022-12-06 DIAGNOSIS — E1140 Type 2 diabetes mellitus with diabetic neuropathy, unspecified: Secondary | ICD-10-CM | POA: Diagnosis not present

## 2022-12-06 DIAGNOSIS — G720 Drug-induced myopathy: Secondary | ICD-10-CM

## 2022-12-06 DIAGNOSIS — E785 Hyperlipidemia, unspecified: Secondary | ICD-10-CM | POA: Diagnosis not present

## 2022-12-06 DIAGNOSIS — Z780 Asymptomatic menopausal state: Secondary | ICD-10-CM | POA: Diagnosis not present

## 2022-12-06 NOTE — Progress Notes (Signed)
I connected with  [name] on 12/06/22 by a audio enabled telemedicine application and verified that I am speaking with the correct person using two identifiers.  Patient Location: Home  Provider Location: Home Office  I discussed the limitations of evaluation and management by telemedicine. The patient expressed understanding and agreed to proceed.  Subjective:   Kristy [name] is a 72 y.o. female who presents for Medicare Annual (Subsequent) preventive examination.  Review of Systems      Cardiac Risk Factors include: advanced age (>53mn, >>34women);diabetes mellitus;dyslipidemia;hypertension     Objective:    Today's Vitals   12/06/22 1205  Weight: 172 lb (78 kg)  Height: 5' 3.5" (1.613 m)   Body mass index is 29.99 kg/m.     12/06/2022   12:29 PM 07/26/2021    8:46 AM 08/21/2019    5:10 PM 08/06/2019    3:00 PM 08/04/2019    8:55 PM 07/31/2019   12:26 PM 07/08/2019    8:33 PM  Advanced Directives  Does Patient Have a Medical Advance Directive? Yes Yes Unable to assess, patient is non-responsive or altered mental status No Yes Yes Yes  Type of Advance Directive H[place]Living will HCrystal LawnsLiving will   Healthcare Power of AIs[place]Living will HP[place]uneLiving will  Does patient want to make changes to medical advance directive?  No - Patient declined  Yes (Inpatient - patient defers changing a medical advance directive at this time - Information given)  No - Patient declined No - Patient declined  Copy of HLewistonin Chart? No - copy requested Yes - validated most recent copy scanned in chart (See row information)  No - copy requested  No - copy requested No - copy requested  Would patient like information on creating a medical advance directive?    Yes (Inpatient - patient requests chaplain consult to create a medical advance directive)       Current  Medications (verified) Outpatient Encounter Medications as of 12/06/2022  Medication Sig   aspirin EC 81 MG EC tablet Take 1 tablet (81 mg total) by mouth daily.   BD INSULIN SYRINGE U/F 31G X 5/16" 0.5 ML MISC USE AS DIRECTED   carvedilol (COREG) 12.5 MG tablet TAKE 1 TABLET BY MOUTH TWICE DAILY   Ensure (ENSURE) Take 237 mLs by mouth daily.   hydrocortisone 2.5 % cream Apply topically daily. Tuesday, Thursday, Saturday   insulin regular human CONCENTRATED (HUMULIN R) 500 UNIT/ML injection Inject 0.13 mLs (65 Units total) into the skin 3 (three) times daily with meals. Pt is using U-100 needles - dose is 13 units TID as of 09/30/21   ketoconazole (NIZORAL) 2 % cream Apply 1 Application topically daily. Monday, Wednesday, Friday   ONETOUCH ULTRA test strip USE TO CHECK BLOOD SUGAR UP TO 8 TIMES DAILY   Probiotic Product (ALIGN PO) Take 1 tablet by mouth daily.   sacubitril-valsartan (ENTRESTO) 49-51 MG Take 1 tablet by mouth 2 (two) times daily. Please scheduled office visit for further refills. Thank you!   chlorhexidine (PERIDEX) 0.12 % solution RINSE WITH 1 CAPFUL (15ML) FOR 30 SECONDS IN THE MORNING AND IN THE EVENING AFTER TOOTHBRUSHING DO NOT SWALLOW (Patient not taking: Reported on 12/06/2022)   LEVEMIR 100 UNIT/ML injection INJECT 25 UNITS TOTAL INTO THE SKIN TWICE DAILY AS DIRECTED. (Patient not taking: Reported on 12/06/2022)   No facility-administered encounter medications on file as of 12/06/2022.  Allergies (verified) Fish oil, Glimepiride, Guanfacine hcl, Other, Rosiglitazone, and Shellfish allergy   History: Past Medical History:  Diagnosis Date   Allergic rhinitis    Basal cell carcinoma 08/17/2014   Left nasal ala- Mohs at [facility]   Cataract    mild    Crohn's disease ([place]) 10/13/2013   Diverticulosis    GERD (gastroesophageal reflux disease)    Gout    HFrEF (heart failure with reduced ejection fraction) ([place])    a. 12/2008 Cath: EF 45% w/ inf HK; b. 07/2009 Echo: EF  50-55%; c. 08/2018 Echo: EF 20-25%.   Hyperlipidemia    Hypertension    IBS (irritable bowel syndrome)    Left bundle branch block    Neuromuscular disorder (HCC)    neuropathy   NICM (nonischemic cardiomyopathy) ([place])    a. 12/2008 Cath: no significant dzs, EF 45% w/ inf HK->Med Rx; b. 07/2009 Echo: EF 50-55%; c. 08/2018 Echo: EF 20-25%, ant/antsept HK, mild MR, mildly dil LA, nl RV fx; d. 08/2018 Cath: D1 80, otw nonobs dzs->Med rx.   Non-obstructive CAD (coronary artery disease)    a. 12/2008 Cath: no significant dzs, EF 45% w/ inf HK->Med Rx; b. 08/2018 Cath: LM nl, LAD min irregs, D1 80, LCX 87md, RCA min irregs->Med Rx.   Osteoarthritis    Patient has active power of attorney for health care: TOretha Caprice01/11/2020   Symptomatic cholelithiasis    Uncontrolled type 2 diabetes mellitus with stage 3 chronic kidney disease, with long-term current use of insulin         Past Surgical History:  Procedure Laterality Date   BREAST BIOPSY Right 06/24/2019   stereo bx/ x clip/ neg   BREAST CYST ASPIRATION     CARDIAC CATHETERIZATION     CATARACT EXTRACTION W/PHACO Right 07/26/2021   Procedure: CATARACT EXTRACTION PHACO AND INTRAOCULAR LENS PLACEMENT ([place] RIGHT DIABETIC;  Surgeon: PBirder Robson MD;  [facility]  Service: Ophthalmology;  Laterality: Right;  Diabetic 8.99 01:10.2   COLONOSCOPY     DILATION AND CURETTAGE OF UTERUS     LEFT HEART CATH AND CORONARY ANGIOGRAPHY N/A 04/24/2019   Procedure: LEFT HEART CATH AND CORONARY ANGIOGRAPHY;  Surgeon: ENelva Bush MD;  [facility];  Service: Cardiovascular;  Laterality: N/A;   RIGHT/LEFT HEART CATH AND CORONARY ANGIOGRAPHY N/A 08/26/2018   Procedure: RIGHT/LEFT HEART CATH AND CORONARY ANGIOGRAPHY;  Surgeon: AWellington Hampshire MD;  [facility];  Service: Cardiovascular;  Laterality: N/A;   SHOULDER SURGERY     15 + yrs ago    SKIN SURGERY     nose    VAGINAL HYSTERECTOMY      Family History  Problem Relation Age of Onset   Kidney failure Mother    Hypertension Father    Kidney failure Brother    Colon cancer Neg Hx    Colon polyps Neg Hx    Rectal cancer Neg Hx    Stomach cancer Neg Hx    Breast cancer Neg Hx    Social History   Socioeconomic History   Marital status: Widowed    Spouse name: Not on file   Number of children: Not on file   Years of education: Not on file   Highest education level: Not on file  Occupational History   Not on file  Tobacco Use   Smoking status: Former    Packs/day: 0.75    Years: 30.00    Total pack years: 22.50  Types: Cigarettes    Quit date: 06/06/1997    Years since quitting: 25.5   Smokeless tobacco: Never  Vaping Use   Vaping Use: Never used  Substance and Sexual Activity   Alcohol use: No   Drug use: No   Sexual activity: Yes  Other Topics Concern   Not on file  Social History Narrative   Not on file   Social Determinants of Health   Financial Resource Strain: Low Risk  (02/15/2022)   Overall Financial Resource Strain (CARDIA)    Difficulty of Paying Living Expenses: Not very hard  Food Insecurity: No Food Insecurity (12/06/2022)   Hunger Vital Sign    Worried About Running Out of Food in the Last Year: Never true    Ran Out of Food in the Last Year: Never true  Transportation Needs: No Transportation Needs (12/06/2022)   PRAPARE - Hydrologist (Medical): No    Lack of Transportation (Non-Medical): No  Physical Activity: Not on file  Stress: Not on file  Social Connections: Moderately Isolated (12/06/2022)   Social Connection and Isolation Panel [NHANES]    Frequency of Communication with Friends and Family: More than three times a week    Frequency of Social Gatherings with Friends and Family: More than three times a week    Attends Religious Services: More than 4 times per year    Active Member of [employer] or Organizations: No    Attends Archivist  Meetings: Never    Marital Status: Widowed    Tobacco Counseling Counseling given: Not Answered   Clinical Intake:  Pre-visit preparation completed: Yes  Pain : No/denies pain     Nutritional Risks: None Diabetes: Yes CBG done?: No Did pt. bring in CBG monitor from home?: No  How often do you need to have someone help you when you read instructions, pamphlets, or other written materials from your doctor or pharmacy?: 1 - Never  Diabetic?Nutrition Risk Assessment:  Has the patient had any N/V/D within the last 2 months?  No  Does the patient have any non-healing wounds?  No  Has the patient had any unintentional weight loss or weight gain?  Yes 10 lb over the year.  Diabetes:  Is the patient diabetic?  Yes  If diabetic, was a CBG obtained today?  No  Did the patient bring in their glucometer from home?  No  How often do you monitor your CBG's? Wears Continuous monitor.   Financial Strains and Diabetes Management:  Are you having any financial strains with the device, your supplies or your medication?  [facility] provides insulin .  Does the patient want to be seen by Chronic Care Management for management of their diabetes?   Followed by Rob Hickman Would the patient like to be referred to a Nutritionist or for Diabetic Management?  No   Diabetic Exams:  Diabetic Eye Exam: Completed Dr.Portilio appt. 12/08/2022 Diabetic Foot Exam: Completed Dr.Patel 11/2022    Interpreter Needed?: No  Information entered by :: C.Zanyah Lentsch LPN   Activities of Daily Living    12/06/2022   12:30 PM  In your present state of health, do you have any difficulty performing the following activities:  Hearing? 0  Vision? 0  Difficulty concentrating or making decisions? 0  Walking or climbing stairs? 0  Dressing or bathing? 0  Doing errands, shopping? 0  Preparing Food and eating ? N  Using the Toilet? N  In the past six months, have you  accidently leaked urine? N  Do you have problems with  loss of bowel control? N  Managing your Medications? N  Managing your Finances? N  Housekeeping or managing your Housekeeping? N    Patient Care Team: Owens Loffler, MD as PCP - General (Family Medicine) Rockey Situ Kathlene November, MD as PCP - Cardiology (Cardiology) Minna Merritts, MD as Consulting Physician (Cardiology) Charlton Haws, [facility] as Pharmacist (Pharmacist)  Indicate any recent Medical Services you may have received from other than [facility] providers in the past year (date may be approximate).     Assessment:   This is a routine wellness examination for [employer].  Hearing/Vision screen Hearing Screening - Comments:: No aids Vision Screening - Comments:: Wears glasses - Dr.Portillio  Dietary issues and exercise activities discussed: Current Exercise Habits: The patient does not participate in regular exercise at present, Exercise limited by: None identified   Goals Addressed             This Visit's Progress    Patient Stated       Keeping sugar in control.       Depression Screen    12/05/2021   10:25 AM 07/31/2019   12:20 PM 02/12/2019   11:13 AM 07/27/2017    9:28 AM 07/16/2017    2:43 PM  PHQ 2/9 Scores  PHQ - 2 Score 0 2 3 0 0  PHQ- 9 Score  5 11      Fall Risk    12/06/2022   12:30 PM 12/05/2021   10:25 AM 07/31/2019    1:29 PM 08/14/2017    3:00 PM 07/27/2017    9:28 AM  Fall Risk   Falls in the past year? 0 0 1  No  Comment    no falls since previous visit   Number falls in past yr: 0  1    Injury with Fall? 0  1    Risk for fall due to : No Fall Risks  History of fall(s);Impaired balance/gait;Impaired mobility;Medication side effect    Follow up Falls prevention discussed;Falls evaluation completed  Falls evaluation completed;Education provided;Falls prevention discussed      FALL RISK PREVENTION PERTAINING TO THE HOME:  Any stairs in or around the home? No  If so, are there any without handrails? No  Home free of loose throw rugs in  walkways, pet beds, electrical cords, etc? No  Adequate lighting in your home to reduce risk of falls? Yes   ASSISTIVE DEVICES UTILIZED TO PREVENT FALLS:  Life alert? No  Use of a cane, walker or w/c? No  Grab bars in the bathroom? Yes  Shower chair or bench in shower? Yes  Elevated toilet seat or a handicapped toilet? Yes   Cognitive Function:        12/06/2022   12:31 PM  6CIT Screen  What Year? 0 points  What month? 0 points  What time? 0 points  Count back from 20 0 points  Months in reverse 0 points  Repeat phrase 10 points  Total Score 10 points    Immunizations Immunization History  Administered Date(s) Administered   PPD Test 08/29/2019   Td 10/02/1993, 01/06/2008    TDAP status: Due, Education has been provided regarding the importance of this vaccine. Advised may receive this vaccine at local pharmacy or Health Dept. Aware to provide a copy of the vaccination record if obtained from local pharmacy or Health Dept. Verbalized acceptance and understanding. Pt Declined  Flu Vaccine status: Declined, Education  has been provided regarding the importance of this vaccine but patient still declined. Advised may receive this vaccine at local pharmacy or Health Dept. Aware to provide a copy of the vaccination record if obtained from local pharmacy or Health Dept. Verbalized acceptance and understanding.  Pneumococcal vaccine status: Declined,  Education has been provided regarding the importance of this vaccine but patient still declined. Advised may receive this vaccine at local pharmacy or Health Dept. Aware to provide a copy of the vaccination record if obtained from local pharmacy or Health Dept. Verbalized acceptance and understanding.   Covid-19 vaccine status: Declined, Education has been provided regarding the importance of this vaccine but patient still declined. Advised may receive this vaccine at local pharmacy or Health Dept.or vaccine clinic. Aware to provide a copy  of the vaccination record if obtained from local pharmacy or Health Dept. Verbalized acceptance and understanding.  Qualifies for Shingles Vaccine? Yes   Zostavax completed No   Shingrix Completed?: No.    Education has been provided regarding the importance of this vaccine. Patient has been advised to call insurance company to determine out of pocket expense if they have not yet received this vaccine. Advised may also receive vaccine at local pharmacy or Health Dept. Verbalized acceptance and understanding.  Screening Tests Health Maintenance  Topic Date Due   COVID-19 Vaccine (1) Never done   Zoster Vaccines- Shingrix (1 of 2) Never done   DEXA SCAN  Never done   DTaP/Tdap/Td (3 - Tdap) 01/05/2018   Diabetic kidney evaluation - Urine ACR  12/10/2019   MAMMOGRAM  02/22/2022   OPHTHALMOLOGY EXAM  04/11/2022   Diabetic kidney evaluation - eGFR measurement  12/06/2022   HEMOGLOBIN A1C  12/06/2022   Pneumonia Vaccine 90+ Years old (1 of 2 - PCV) 12/06/2022 (Originally 06/06/1957)   INFLUENZA VACCINE  12/31/2022 (Originally 05/02/2022)   FOOT EXAM  09/22/2023   Medicare Annual Wellness (AWV)  12/06/2023   COLONOSCOPY (Pts 45-39yr Insurance coverage will need to be confirmed)  10/13/2027   Hepatitis C Screening  Completed   HPV VACCINES  Aged Out    Health Maintenance  Health Maintenance Due  Topic Date Due   COVID-19 Vaccine (1) Never done   Zoster Vaccines- Shingrix (1 of 2) Never done   DEXA SCAN  Never done   DTaP/Tdap/Td (3 - Tdap) 01/05/2018   Diabetic kidney evaluation - Urine ACR  12/10/2019   MAMMOGRAM  02/22/2022   OPHTHALMOLOGY EXAM  04/11/2022   Diabetic kidney evaluation - eGFR measurement  12/06/2022   HEMOGLOBIN A1C  12/06/2022    Colonoscopy - pt declined  Mammogram status: Completed 02/22/2021. Repeat every year pt declined.  Bone Density scan- Ordered today.  Lung Cancer Screening: (Low Dose CT Chest recommended if Age 72-80years, 30 pack-year currently  smoking OR have quit w/in 15years.) does not qualify.   Lung Cancer Screening Referral: no  Additional Screening:  Hepatitis C Screening: does qualify; Completed no.   Vision Screening: Recommended annual ophthalmology exams for early detection of glaucoma and other disorders of the eye. Is the patient up to date with their annual eye exam?  Yes  Who is the provider or what is the name of the office in which the patient attends annual eye exams? Dr.Portillio If pt is not established with a provider, would they like to be referred to a provider to establish care? No .   Dental Screening: Recommended annual dental exams for proper oral hygiene  Community Resource Referral /  Chronic Care Management: CRR required this visit?  No   CCM required this visit?  No      Plan:     I have personally reviewed and noted the following in the patient's chart:   Medical and social history Use of alcohol, tobacco or illicit drugs  Current medications and supplements including opioid prescriptions. Patient is not currently taking opioid prescriptions. Functional ability and status Nutritional status Physical activity Advanced directives List of other physicians Hospitalizations, surgeries, and ER visits in previous 12 months Vitals Screenings to include cognitive, depression, and falls Referrals and appointments  In addition, I have reviewed and discussed with patient certain preventive protocols, quality metrics, and best practice recommendations. A written personalized care plan for preventive services as well as general preventive health recommendations were provided to patient.     Lebron Conners, LPN   [id number]   Nurse Notes: Vaccinations: declines all Influenza vaccine: recommend every Fall Pneumococcal vaccine: recommend once per lifetime Prevnar-20 Tdap vaccine: recommend every 10 years Shingles vaccine: recommend Shingrix which is 2 doses 2-6 months apart and over 90%  effective     Covid-19: recommend 2 doses one month apart with a booster 6 months later  Pt refused to answer most question on the 6 SIT but score was 10 on questions completed. Pt declined mammogram and colonoscopy. Bone scan order placed. Pt also stated that family members were coming into her home uninvited searching for money. Pt declined referral to social work.

## 2022-12-06 NOTE — Patient Instructions (Signed)
Kristy [name] , Thank you for taking time to come for your Medicare Wellness Visit. I appreciate your ongoing commitment to your health goals. Please review the following plan we discussed and let me know if I can assist you in the future.   These are the goals we discussed:  Goals      Monitor and Manage My Blood Sugar-Diabetes Type 2     Timeframe:  Long-Range Goal Priority:  High Start Date:     12/12/21                        Expected End Date:    12/13/22                   Follow Up Date July 2023   - check blood sugar using Freestyle Libre 2 - check blood sugar if I feel it is too high or too low - enter blood sugar readings and medication or insulin into daily log - take the blood sugar log to all doctor visits    Why is this important?   Checking your blood sugar at home helps to keep it from getting very high or very low.  Writing the results in a diary or log helps the doctor know how to care for you.  Your blood sugar log should have the time, date and the results.  Also, write down the amount of insulin or other medicine that you take.  Other information, like what you ate, exercise done and how you were feeling, will also be helpful.     Notes:      Patient Stated     Keeping sugar in control.        This is a list of the screening recommended for you and due dates:  Health Maintenance  Topic Date Due   COVID-19 Vaccine (1) Never done   Zoster (Shingles) Vaccine (1 of 2) Never done   DEXA scan (bone density measurement)  Never done   DTaP/Tdap/Td vaccine (3 - Tdap) 01/05/2018   Yearly kidney health urinalysis for diabetes  12/10/2019   Mammogram  02/22/2022   Eye exam for diabetics  04/11/2022   Yearly kidney function blood test for diabetes  12/06/2022   Hemoglobin A1C  12/06/2022   Pneumonia Vaccine (1 of 2 - PCV) 12/06/2022*   Flu Shot  12/31/2022*   Complete foot exam   09/22/2023   Medicare Annual Wellness Visit  12/06/2023   Colon Cancer Screening   10/13/2027   Hepatitis C Screening: USPSTF Recommendation to screen - Ages 18-79 yo.  Completed   HPV Vaccine  Aged Out  *Topic was postponed. The date shown is not the original due date.    Advanced directives: Please bring a copy of your health care power of attorney and living will to the office to be added to your chart at your convenience.   Conditions/risks identified: Aim for 30 minutes of exercise or brisk walking, 6-8 glasses of water, and 5 servings of fruits and vegetables each day.   Next appointment: Follow up in one year for your annual wellness visit 12/11/2023 @ 10:00am via telephone.   Preventive Care 35 Years and Older, Female Preventive care refers to lifestyle choices and visits with your health care provider that can promote health and wellness. What does preventive care include? A yearly physical exam. This is also called an annual well check. Dental exams once or twice a year. Routine eye exams.  Ask your health care provider how often you should have your eyes checked. Personal lifestyle choices, including: Daily care of your teeth and gums. Regular physical activity. Eating a healthy diet. Avoiding tobacco and drug use. Limiting alcohol use. Practicing safe sex. Taking low-dose aspirin every day. Taking vitamin and mineral supplements as recommended by your health care provider. What happens during an annual well check? The services and screenings done by your health care provider during your annual well check will depend on your age, overall health, lifestyle risk factors, and family history of disease. Counseling  Your health care provider may ask you questions about your: Alcohol use. Tobacco use. Drug use. Emotional well-being. Home and relationship well-being. Sexual activity. Eating habits. History of falls. Memory and ability to understand (cognition). Work and work Statistician. Reproductive health. Screening  You may have the following tests  or measurements: Height, weight, and BMI. Blood pressure. Lipid and cholesterol levels. These may be checked every 5 years, or more frequently if you are over 64 years old. Skin check. Lung cancer screening. You may have this screening every year starting at age 32 if you have a 30-pack-year history of smoking and currently smoke or have quit within the past 15 years. Fecal occult blood test (FOBT) of the stool. You may have this test every year starting at age 75. Flexible sigmoidoscopy or colonoscopy. You may have a sigmoidoscopy every 5 years or a colonoscopy every 10 years starting at age 79. Hepatitis C blood test. Hepatitis B blood test. Sexually transmitted disease (STD) testing. Diabetes screening. This is done by checking your blood sugar (glucose) after you have not eaten for a while (fasting). You may have this done every 1-3 years. Bone density scan. This is done to screen for osteoporosis. You may have this done starting at age 94. Mammogram. This may be done every 1-2 years. Talk to your health care provider about how often you should have regular mammograms. Talk with your health care provider about your test results, treatment options, and if necessary, the need for more tests. Vaccines  Your health care provider may recommend certain vaccines, such as: Influenza vaccine. This is recommended every year. Tetanus, diphtheria, and acellular pertussis (Tdap, Td) vaccine. You may need a Td booster every 10 years. Zoster vaccine. You may need this after age 24. Pneumococcal 13-valent conjugate (PCV13) vaccine. One dose is recommended after age 1. Pneumococcal polysaccharide (PPSV23) vaccine. One dose is recommended after age 67. Talk to your health care provider about which screenings and vaccines you need and how often you need them. This information is not intended to replace advice given to you by your health care provider. Make sure you discuss any questions you have with your  health care provider. Document Released: 10/15/2015 Document Revised: 06/07/2016 Document Reviewed: 07/20/2015 Elsevier Interactive Patient Education  2017 [place] Prevention in the Home Falls can cause injuries. They can happen to people of all ages. There are many things you can do to make your home safe and to help prevent falls. What can I do on the outside of my home? Regularly fix the edges of walkways and driveways and fix any cracks. Remove anything that might make you trip as you walk through a door, such as a raised step or threshold. Trim any bushes or trees on the path to your home. Use bright outdoor lighting. Clear any walking paths of anything that might make someone trip, such as rocks or tools. Regularly check to  see if handrails are loose or broken. Make sure that both sides of any steps have handrails. Any raised decks and porches should have guardrails on the edges. Have any leaves, snow, or ice cleared regularly. Use sand or salt on walking paths during winter. Clean up any spills in your garage right away. This includes oil or grease spills. What can I do in the bathroom? Use night lights. Install grab bars by the toilet and in the tub and shower. Do not use towel bars as grab bars. Use non-skid mats or decals in the tub or shower. If you need to sit down in the shower, use a plastic, non-slip stool. Keep the floor dry. Clean up any water that spills on the floor as soon as it happens. Remove soap buildup in the tub or shower regularly. Attach bath mats securely with double-sided non-slip rug tape. Do not have throw rugs and other things on the floor that can make you trip. What can I do in the bedroom? Use night lights. Make sure that you have a light by your bed that is easy to reach. Do not use any sheets or blankets that are too big for your bed. They should not hang down onto the floor. Have a firm chair that has side arms. You can use this for  support while you get dressed. Do not have throw rugs and other things on the floor that can make you trip. What can I do in the kitchen? Clean up any spills right away. Avoid walking on wet floors. Keep items that you use a lot in easy-to-reach places. If you need to reach something above you, use a strong step stool that has a grab bar. Keep electrical cords out of the way. Do not use floor polish or wax that makes floors slippery. If you must use wax, use non-skid floor wax. Do not have throw rugs and other things on the floor that can make you trip. What can I do with my stairs? Do not leave any items on the stairs. Make sure that there are handrails on both sides of the stairs and use them. Fix handrails that are broken or loose. Make sure that handrails are as long as the stairways. Check any carpeting to make sure that it is firmly attached to the stairs. Fix any carpet that is loose or worn. Avoid having throw rugs at the top or bottom of the stairs. If you do have throw rugs, attach them to the floor with carpet tape. Make sure that you have a light switch at the top of the stairs and the bottom of the stairs. If you do not have them, ask someone to add them for you. What else can I do to help prevent falls? Wear shoes that: Do not have high heels. Have rubber bottoms. Are comfortable and fit you well. Are closed at the toe. Do not wear sandals. If you use a stepladder: Make sure that it is fully opened. Do not climb a closed stepladder. Make sure that both sides of the stepladder are locked into place. Ask someone to hold it for you, if possible. Clearly mark and make sure that you can see: Any grab bars or handrails. First and last steps. Where the edge of each step is. Use tools that help you move around (mobility aids) if they are needed. These include: Canes. Walkers. Scooters. Crutches. Turn on the lights when you go into a dark area. Replace any light bulbs as soon  as they burn out. Set up your furniture so you have a clear path. Avoid moving your furniture around. If any of your floors are uneven, fix them. If there are any pets around you, be aware of where they are. Review your medicines with your doctor. Some medicines can make you feel dizzy. This can increase your chance of falling. Ask your doctor what other things that you can do to help prevent falls. This information is not intended to replace advice given to you by your health care provider. Make sure you discuss any questions you have with your health care provider. Document Released: 07/15/2009 Document Revised: 02/24/2016 Document Reviewed: 10/23/2014 Elsevier Interactive Patient Education  2017 [employer].

## 2022-12-06 NOTE — Telephone Encounter (Signed)
Contacted [name] to schedule their annual wellness visit. Appointment made for 12/06/2022. Appointment changed from in-person appt to telephone visit.  [place] Direct Dial: [phone]

## 2022-12-06 NOTE — Patient Instructions (Signed)
Visit Information  Phone number for Pharmacist: [phone]  Thank you for meeting with me to discuss your medications! Below is a summary of what we talked about during the visit:   Recommendations/Changes made from today's visit: -advised to take insulin ~9am before breakfast, ~5pm before dinner  Follow up plan: -Pharmacist follow up televisit scheduled for 1 months -Cardiology appt 12/25/22   [name], PharmD, BCACP Clinical Pharmacist [facility] at [facility] [phone]

## 2022-12-06 NOTE — Progress Notes (Signed)
Care Management & Coordination Services Pharmacy Note  12/06/2022 Name:  Kristy [name] MRN:  [id number] DOB:  1951/08/29  Summary: F/U visit -DM: A1c 9.9% (10/2022), pt has longstanding uncontrolled DM for years and A1c is actually improved and more stable now; she has established with new endocrinologist (Dr Pablo Ledger) Reviewed AGP report: 11/23/22 to 12/06/22. Sensor active: 98%  Time in range (70-180): 45% (goal > 70%)  High (180-250): 24%  Very high (>250): 28%  Low (< 70): 3% (goal < 4%)  GMI: 8.0%; Average glucose: 196 -Discussed CGM report, she has a pattern of steadily increasing glucose from ~8a-12p, she typically does not take insulin until glucose is already high in the afternoon  Recommendations/Changes made from today's visit: -advised to take insulin ~9am before breakfast, ~5pm before dinner  Follow up plan: -Pharmacist follow up televisit scheduled for 1 months -Cardiology appt 12/25/22    Subjective: Kristy [name] is an 72 y.o. year old female who is a primary patient of Copland, [name], MD.  The care coordination team was consulted for assistance with disease management and care coordination needs.    Engaged with patient by telephone for follow up visit.  Recent office visits: 06/07/22 Dr Lorelei Pont OV: A1c 9.7%. GMI 8.5%; D/C ivabradine (pt preference).   Recent consult visits: 10/23/22 Dr Pablo Ledger (Endocrine): initial consult - A1c 9.9%; give insulin after meals, adjust U-500 to 15 units (75 units) when giving before 5 pm, 12 units (60 units) when giving after 5 pm; Rx Glucagon (Gvoke); upgrade to [place] 3 through PCP office  08/31/22 Dr Nehemiah Massed (Dermatology): keratosis. Rx hydrocortisone, ketoconazole 2%  06/06/22 Christell Faith PA (Cardiology): d/c spironolactone per pt preference   Hospital visits: None in previous 6 months   Objective:  Lab Results  Component Value Date   CREATININE 1.07 12/05/2021   BUN 19 12/05/2021   GFR 52.63 (L) 12/05/2021   GFRNONAA  70 04/08/2020   GFRAA 80 04/08/2020   NA 141 12/05/2021   K 4.3 12/05/2021   CALCIUM 10.5 12/05/2021   CO2 28 12/05/2021   GLUCOSE 169 (H) 12/05/2021    Lab Results  Component Value Date/Time   HGBA1C 9.7 (A) 06/07/2022 02:31 PM   HGBA1C 9.4 (H) 12/05/2021 10:35 AM   HGBA1C 11.8 (A) 04/13/2021 02:52 PM   HGBA1C 9.7 (H) 12/08/2019 03:00 PM   HGBA1C >14.0 12/02/2018 12:00 AM   GFR 52.63 (L) 12/05/2021 10:35 AM   GFR 55.55 (L) 10/04/2020 04:19 PM   MICROALBUR 18.1 (H) 12/10/2018 10:08 AM   MICROALBUR 36.0 (H) 01/10/2017 03:15 PM    Last diabetic Eye exam:  Lab Results  Component Value Date/Time   HMDIABEYEEXA Retinopathy (A) 04/11/2021 12:00 AM    Last diabetic Foot exam: No results found for: "HMDIABFOOTEX"   Lab Results  Component Value Date   CHOL 261 (H) 12/05/2021   HDL 34.30 (L) 12/05/2021   LDLCALC 103 (H) 04/22/2019   LDLDIRECT 187.0 12/05/2021   TRIG 205.0 (H) 12/05/2021   CHOLHDL 8 12/05/2021       Latest Ref Rng & Units 12/05/2021   10:35 AM 10/04/2020    4:19 PM 12/08/2019    3:00 PM  Hepatic Function  Total Protein 6.0 - 8.3 g/dL 7.1  7.3  6.8   Albumin 3.5 - 5.2 g/dL 4.2  4.5  3.9   AST 0 - 37 U/L '21  22  17   '$ ALT 0 - 35 U/L '17  19  13   '$ Alk Phosphatase 39 -  117 U/L 69  81  103   Total Bilirubin 0.2 - 1.2 mg/dL 0.6  0.5  0.4   Bilirubin, Direct 0.0 - 0.3 mg/dL 0.1  0.1  0.1     Lab Results  Component Value Date/Time   TSH 1.325 08/07/2019 01:21 PM   TSH 0.586 04/19/2019 06:54 PM   TSH 3.88 07/11/2018 08:46 AM   TSH 1.13 01/10/2017 03:15 PM   FREET4 1.04 04/19/2019 06:54 PM   FREET4 0.98 09/07/2014 02:43 PM       Latest Ref Rng & Units 12/05/2021   10:35 AM 10/04/2020    4:19 PM 12/08/2019    3:00 PM  CBC  WBC 4.0 - 10.5 K/uL 9.5  9.6  10.9   Hemoglobin 12.0 - 15.0 g/dL 13.6  14.3  13.0   Hematocrit 36.0 - 46.0 % 41.6  43.3  40.4   Platelets 150.0 - 400.0 K/uL 167.0  181.0  196.0     Lab Results  Component Value Date/Time   VITAMINB12 491  08/07/2019 01:21 PM    Clinical ASCVD: Yes  The ASCVD Risk score (Arnett DK, et al., 2019) failed to calculate for the following reasons:   The patient has a prior MI or stroke diagnosis        12/05/2021   10:25 AM 07/31/2019   12:20 PM 02/12/2019   11:13 AM  Depression screen PHQ 2/9  Decreased Interest 0 0 3  Down, Depressed, Hopeless 0 2 0  PHQ - 2 Score 0 2 3  Altered sleeping  1 0  Tired, decreased energy  1 3  Change in appetite  1 3  Feeling bad or failure about yourself   0 0  Trouble concentrating  0 0  Moving slowly or fidgety/restless  0 2  Suicidal thoughts  0 0  PHQ-9 Score  5 11  Difficult doing work/chores   Not difficult at all     Social History   Tobacco Use  Smoking Status Former   Packs/day: 0.75   Years: 30.00   Total pack years: 22.50   Types: Cigarettes   Quit date: 06/06/1997   Years since quitting: 25.5  Smokeless Tobacco Never   BP Readings from Last 3 Encounters:  12/06/22 (!) 150/90  08/31/22 127/69  06/07/22 110/60   Pulse Readings from Last 3 Encounters:  12/06/22 83  08/31/22 73  06/07/22 76   Wt Readings from Last 3 Encounters:  12/06/22 175 lb 6 oz (79.5 kg)  12/06/22 172 lb (78 kg)  06/07/22 170 lb 2 oz (77.2 kg)   BMI Readings from Last 3 Encounters:  12/06/22 31.57 kg/m  12/06/22 29.99 kg/m  06/07/22 29.66 kg/m    Allergies  Allergen Reactions   Fish Oil Other (See Comments)    Gout   Glimepiride Other (See Comments)    REACTION: hypoglycemia   Guanfacine Hcl Other (See Comments)    REACTION: unspecified   Other     Splenda caused sugars levels to increase   Rosiglitazone Other (See Comments)    CHF   Shellfish Allergy     Gout    Medications Reviewed Today     Reviewed by Charlton Haws, [facility] (Pharmacist) on 12/06/22 at 28  Med List Status: <None>   Medication Order Taking? Sig Documenting Provider Last Dose Status Informant  aspirin EC 81 MG EC tablet [id number] Yes Take 1 tablet (81 mg total)  by mouth daily. Hillary Bow, MD Taking Active Other  BD INSULIN  SYRINGE U/F 31G X 5/16" 0.5 ML MISC [id number] Yes USE AS DIRECTED Copland, [name], MD Taking Active   carvedilol (COREG) 12.5 MG tablet [id number] Yes TAKE 1 TABLET BY MOUTH TWICE DAILY Gollan, Kathlene November, MD Taking Active   chlorhexidine (PERIDEX) 0.12 % solution [id number] Yes  [provider] Taking Active   [facility]) [id number] Yes Take 237 mLs by mouth daily. [provider] Taking Active Other  GVOKE HYPOPEN 2-PACK 1 MG/0.2ML SOAJ [id number] Yes Inject into the skin. [provider] Taking Active   hydrocortisone 2.5 % cream [id number] Yes Apply topically daily. Tuesday, Thursday, Saturday Ralene Bathe, MD Taking Active   insulin regular human CONCENTRATED (HUMULIN R) 500 UNIT/ML injection [id number] Yes Inject 0.13 mLs (65 Units total) into the skin 3 (three) times daily with meals. Pt is using U-100 needles - dose is 13 units TID as of 09/30/21 Owens Loffler, MD Taking Active            Med Note [facility], CHRISTAN M   Wed Dec 06, 2022 12:26 PM) Injecting 15u BID  ketoconazole (NIZORAL) 2 % cream [id number] Yes Apply 1 Application topically daily. Monday, Wednesday, Friday Ralene Bathe, MD Taking Active   [facility] ULTRA test strip [id number] Yes USE TO CHECK BLOOD SUGAR UP TO 8 TIMES DAILY Copland, [name], MD Taking Active   Probiotic Product (ALIGN PO) [id number] Yes Take 1 tablet by mouth daily. [provider] Taking Active   sacubitril-valsartan (ENTRESTO) 49-51 MG [id number] Yes Take 1 tablet by mouth 2 (two) times daily. Please scheduled office visit for further refills. Thank you! Minna Merritts, MD Taking Active             SDOH:  (Social Determinants of Health) assessments and interventions performed: No SDOH Interventions    Flowsheet Row Clinical Support from 12/06/2022 in [place] [facility] [place] [facility] from 02/15/2022 in  [place] [facility] [place] Management from 09/12/2021 in [place] [facility] [place] [facility] from 01/10/2021 in [place] [facility] [place] Interventions Intervention Not Indicated Intervention Not Indicated -- --  Housing Interventions Intervention Not Indicated -- -- --  Transportation Interventions Intervention Not Indicated -- -- --  Utilities Interventions Intervention Not Indicated -- -- --  Financial Strain Interventions -- Other (Comment)  Ralph Leyden Cares - Humulin] Other (Comment)  [Assistance applied/approved U-500 insulin] Intervention Not Indicated  [Meds affordable]  Social Connections Interventions Intervention Not Indicated -- -- --       Medication Assistance:  Humulin R U-500 - [employer] Cares approved 2024  Medication Access: Within the past 30 days, how often has patient missed a dose of medication? 0 Is a pillbox or other method used to improve adherence? Yes  Factors that may affect medication adherence? no barriers identified Are meds synced by current pharmacy? No  Are meds delivered by current pharmacy? No  Does patient experience delays in picking up medications due to transportation concerns? No   [facility] Services Reviewed: Is patient disadvantaged to use [facility]?: Yes  Current Rx insurance plan: [employer] Name and location of Current pharmacy:  [place], [place] - [address] [place] [address] Phone: [phone] Fax: [phone]  [facility] services reviewed with patient today?: No  Patient requests to transfer care to [facility]?: No  Reason patient declined  to change pharmacies: Disadvantaged due to insurance/mail order  Compliance/Adherence/Medication fill history: Care Gaps: DEXA (never done) - ordered 12/06/22 UACR (due 12/2019) Mammogram (due 01/2022) Eye exam (due  04/2022)  Star-Rating Drugs: None   ASSESSMENT / PLAN  Hyperlipidemia/CAD: (LDL goal < 70) -Uncontrolled - LDL 187 (11/2021). LDL previously <130 in 2011, jumped to 195 in 2015. She has not been able to tolerate any cholesterol medication. -Hx CAD (single-vessel disease, medical mgmt); hx stroke x2 per patient -Current treatment: None -Medications previously tried: atorvastatin 80, rosuvastatin 5-10, pravastatin 10, Zetia, Repatha (felt awful x 2 wks) -Educated on Cholesterol goals; discussed majority of cholesterol made endogenously and most medications target this; discussed Praluent, Nexletol and Leqvio - last remaining options for LDL lowering; she would be a candidate for Leqvio but would need to discuss with cardiology/lipid clinic  Hypertension / Heart Failure (BP goal <140/90) -Controlled - BP at goal at home -Current home readings: 138/70, HR 80 -LVEF: 55-60% (05/11/20) - improved, previously < 40% -HF type: HFimpEF; NYHA class I symptoms -Follows with cardiology (Dr Rockey Situ) -Current treatment: Carvedilol 12.5 mg BID - Appropriate, Effective, Safe, Accessible Entresto 49-51 mg BID -Appropriate, Effective, Safe, Accessible -Medications previously tried: spironolactone, ivabradine -Counseled to monitor BP at home at least once monthly, document, and provide log at future appointments -Recommended to continue current medication  Diabetes (A1c goal <8%) -Uncontrolled, improved - A1c 9.9% (10/2022), improved from 11.2% after receiving Humulin R U-500 in vials instead of pens, for some reason the Vial insulin is working better -DM history: Diagnosed with type 2 diabetes 1998, transitioned to insulin 2005. Multiple episodes of DKA. Pt has seen multiple endocrinologist and frequently lost to follow up. She recently established with Dr Pablo Ledger [facility] on [address]) -Current home glucose readings - via FL2. Using reader device.  Reviewed AGP report: 11/23/22 to 12/06/22. Sensor active:  98%  Time in range (70-180): 45% (goal > 70%)  High (180-250): 24%  Very high (>250): 28%  Low (< 70): 3% (goal < 4%)  GMI: 8.0%; Average glucose: 196  -Current medications: Humulin R U-500 - 75 units (15u w/ U-100 syringe) 2-3x daily w/ meals (PAP)- Appropriate, Query Effective Freestyle Libre 2 ([place]) - Appropriate, Effective, Safe, Accessible -Medications previously tried: Lantus (pt reports ineffective); rosiglitazone (HF-pt insists this is the reason she developed HF), glimepiride (hypoglycemia); metformin (unknown); Invokana (increased thirst); Bydureon (cost/difficult administration) -Discussed CGM report, she has a pattern of steadily increasing glucose from ~8a-12p, she typically does not take insulin until glucose is already high in the afternoon; advised to take insulin ~9am before breakfast, ~5pm before dinner -Recommend to continue current medication   [name], PharmD, BCACP Clinical Pharmacist [facility] [phone]

## 2022-12-07 ENCOUNTER — Ambulatory Visit: Payer: Medicare HMO | Admitting: Dermatology

## 2022-12-07 LAB — CBC WITH DIFFERENTIAL/PLATELET
Basophils Absolute: 0.1 10*3/uL (ref 0.0–0.1)
Basophils Relative: 1.4 % (ref 0.0–3.0)
Eosinophils Absolute: 0.2 10*3/uL (ref 0.0–0.7)
Eosinophils Relative: 2.2 % (ref 0.0–5.0)
HCT: 43.7 % (ref 36.0–46.0)
Hemoglobin: 14.3 g/dL (ref 12.0–15.0)
Lymphocytes Relative: 40.8 % (ref 12.0–46.0)
Lymphs Abs: 3.6 10*3/uL (ref 0.7–4.0)
MCHC: 32.7 g/dL (ref 30.0–36.0)
MCV: 90.1 fl (ref 78.0–100.0)
Monocytes Absolute: 0.9 10*3/uL (ref 0.1–1.0)
Monocytes Relative: 9.8 % (ref 3.0–12.0)
Neutro Abs: 4.1 10*3/uL (ref 1.4–7.7)
Neutrophils Relative %: 45.8 % (ref 43.0–77.0)
Platelets: 161 10*3/uL (ref 150.0–400.0)
RBC: 4.85 Mil/uL (ref 3.87–5.11)
RDW: 14.6 % (ref 11.5–15.5)
WBC: 8.9 10*3/uL (ref 4.0–10.5)

## 2022-12-07 LAB — COMPREHENSIVE METABOLIC PANEL
ALT: 37 U/L — ABNORMAL HIGH (ref 0–35)
AST: 43 U/L — ABNORMAL HIGH (ref 0–37)
Albumin: 3.8 g/dL (ref 3.5–5.2)
Alkaline Phosphatase: 98 U/L (ref 39–117)
BUN: 11 mg/dL (ref 6–23)
CO2: 30 mEq/L (ref 19–32)
Calcium: 10.4 mg/dL (ref 8.4–10.5)
Chloride: 102 mEq/L (ref 96–112)
Creatinine, Ser: 0.88 mg/dL (ref 0.40–1.20)
GFR: 66.08 mL/min (ref >60.00)
Glucose, Bld: 232 mg/dL — ABNORMAL HIGH (ref 70–99)
Potassium: 4 mEq/L (ref 3.5–5.1)
Sodium: 139 mEq/L (ref 135–145)
Total Bilirubin: 0.5 mg/dL (ref 0.2–1.2)
Total Protein: 6.9 g/dL (ref 6.0–8.3)

## 2022-12-07 LAB — MICROALBUMIN / CREATININE URINE RATIO
Creatinine,U: 44.3 mg/dL
Microalb Creat Ratio: 72.4 mg/g — ABNORMAL HIGH (ref 0.0–30.0)
Microalb, Ur: 32.1 mg/dL — ABNORMAL HIGH (ref 0.0–1.9)

## 2022-12-07 LAB — LIPID PANEL
Cholesterol: 239 mg/dL — ABNORMAL HIGH (ref 0–200)
HDL: 41.2 mg/dL (ref >39.00)
LDL Cholesterol: 159 mg/dL — ABNORMAL HIGH (ref 0–99)
NonHDL: 197.45
Total CHOL/HDL Ratio: 6
Triglycerides: 191 mg/dL — ABNORMAL HIGH (ref 0.0–149.0)
VLDL: 38.2 mg/dL (ref 0.0–40.0)

## 2022-12-08 DIAGNOSIS — E113412 Type 2 diabetes mellitus with severe nonproliferative diabetic retinopathy with macular edema, left eye: Secondary | ICD-10-CM | POA: Diagnosis not present

## 2022-12-08 DIAGNOSIS — E113411 Type 2 diabetes mellitus with severe nonproliferative diabetic retinopathy with macular edema, right eye: Secondary | ICD-10-CM | POA: Diagnosis not present

## 2022-12-09 DIAGNOSIS — E1165 Type 2 diabetes mellitus with hyperglycemia: Secondary | ICD-10-CM | POA: Diagnosis not present

## 2022-12-09 DIAGNOSIS — Z794 Long term (current) use of insulin: Secondary | ICD-10-CM | POA: Diagnosis not present

## 2022-12-18 DIAGNOSIS — E113412 Type 2 diabetes mellitus with severe nonproliferative diabetic retinopathy with macular edema, left eye: Secondary | ICD-10-CM | POA: Diagnosis not present

## 2022-12-18 DIAGNOSIS — E113411 Type 2 diabetes mellitus with severe nonproliferative diabetic retinopathy with macular edema, right eye: Secondary | ICD-10-CM | POA: Diagnosis not present

## 2022-12-21 ENCOUNTER — Other Ambulatory Visit: Payer: Self-pay | Admitting: Cardiovascular Disease

## 2022-12-25 ENCOUNTER — Ambulatory Visit: Payer: Medicare HMO | Admitting: Podiatry

## 2022-12-25 ENCOUNTER — Ambulatory Visit: Payer: Medicare HMO | Attending: Physician Assistant | Admitting: Physician Assistant

## 2022-12-25 ENCOUNTER — Encounter: Payer: Self-pay | Admitting: Physician Assistant

## 2022-12-25 VITALS — BP 142/82 | HR 79 | Ht 62.5 in | Wt 176.0 lb

## 2022-12-25 DIAGNOSIS — I2510 Atherosclerotic heart disease of native coronary artery without angina pectoris: Secondary | ICD-10-CM | POA: Diagnosis not present

## 2022-12-25 DIAGNOSIS — I10 Essential (primary) hypertension: Secondary | ICD-10-CM

## 2022-12-25 DIAGNOSIS — Z789 Other specified health status: Secondary | ICD-10-CM

## 2022-12-25 DIAGNOSIS — E785 Hyperlipidemia, unspecified: Secondary | ICD-10-CM | POA: Diagnosis not present

## 2022-12-25 DIAGNOSIS — I5022 Chronic systolic (congestive) heart failure: Secondary | ICD-10-CM

## 2022-12-25 DIAGNOSIS — I428 Other cardiomyopathies: Secondary | ICD-10-CM

## 2022-12-25 MED ORDER — CARVEDILOL 25 MG PO TABS: 25.0000 mg | ORAL_TABLET | Freq: Two times a day (BID) | ORAL | 1 refills | Status: DC

## 2022-12-25 NOTE — Patient Instructions (Signed)
Medication Instructions:  INCREASE carvedilol to 25 mg by mouth twice a day  *If you need a refill on your cardiac medications before your next appointment, please call your pharmacy*   Lab Work: No labs ordered  If you have labs (blood work) drawn today and your tests are completely normal, you will receive your results only by: [place] (if you have MyChart) OR A paper copy in the mail If you have any lab test that is abnormal or we need to change your treatment, we will call you to review the results.   Testing/Procedures: No testing ordered  Follow-Up: At [facility], you and your health needs are our priority.  As part of our continuing mission to provide you with exceptional heart care, we have created designated Provider Care Teams.  These Care Teams include your primary Cardiologist (physician) and Advanced Practice Providers (APPs -  Physician Assistants and Nurse Practitioners) who all work together to provide you with the care you need, when you need it.  We recommend signing up for the patient portal called "MyChart".  Sign up information is provided on this After Visit Summary.  MyChart is used to connect with patients for Virtual Visits (Telemedicine).  Patients are able to view lab/test results, encounter notes, upcoming appointments, etc.  Non-urgent messages can be sent to your provider as well.   To learn more about what you can do with MyChart, go to [email or web address].    Your next appointment:   6 month(s)  Provider:   You may see Ida Rogue, MD or one of the following Advanced Practice Providers on your designated Care Team:   Murray Hodgkins, NP Christell Faith, PA-C Cadence Kathlen Mody, PA-C Gerrie Nordmann, NP

## 2022-12-25 NOTE — Progress Notes (Signed)
Cardiology Office Note    Date:  12/25/2022   ID:  Kristy, [name] Aug 23, 1951, MRN [id number]  PCP:  Owens Loffler, MD  Cardiologist:  Ida Rogue, MD  Electrophysiologist:  None   Chief Complaint: Follow up  History of Present Illness:   Kristy [name] is a 72 y.o. female with history of nonobstructive CAD, HFimpEF secondary to NICM, uncontrolled diabetes with recurrent DKA, Crohn's disease, HTN, HLD, LBBB, prior tobacco use, gout, and GERD who presents for follow-up of CAD and cardiomyopathy.   She was initially diagnosed with cardiomyopathy in 2010 with an EF of 45% at that time.  Diagnostic cath showed minimal, nonobstructive CAD and she was medically managed.  Patient had recovery of LV systolic function later in 2010 with echo showing an EF of 50 to 55%.  Over the years, she has struggled with controlling her diabetes and has required recurrent admissions for DKA with AKI in the past requiring intermittent cessation of heart failure therapy.  In 08/2018, she developed worsening dyspnea with repeat echo showing an EF of 20 to 25%.  She underwent diagnostic cath which again showed predominantly nonobstructive CAD with more significant stenosis in a small diagonal branch.  Continued medical therapy was recommended.  Repeat echo in 12/2018 showed subsequent improvement in LV systolic function with an EF of 50 to 55%, moderate concentric LVH, diastolic dysfunction, normal RV systolic function, normal RV cavity size, mildly dilated left atrium, no significant valvular abnormality.  She was admitted in 04/2019 with altered mental status with difficulty with word finding preceded by a fall in the bathtub.  She was found on the floor by a family member.  She was noted to be febrile and in DKA upon her admission.  High-sensitivity troponin was elevated, peaking at 5667.  Echo on 04/21/2019 showed an EF of 35 to 40%, severe hypokinesis of the mid apical anterior wall and anteroseptal wall,  normal RV systolic function, normal RV cavity size, trileaflet aortic valve with mild aortic annular calcification, mild mitral annular calcification, normal size and structure aortic root.  Imaging of the head/brain was unrevealing for stroke.  Prior to discharge, the patient underwent diagnostic cath which showed stable appearance of coronary artery since 08/2018 without new lesion to explain recent decline in LVEF or elevated troponin.  It was felt the elevated troponin may be due to supply demand ischemia and or stress-induced cardiomyopathy in the setting of severe DKA.     Echo from 05/2020 demonstrated an EF of 55 to 60%, no regional wall motion abnormalities, grade 1 diastolic dysfunction, mildly reduced RV systolic function with normal ventricular cavity size, and trivial mitral regurgitation.   She was seen in the office in 01/2022 noting weight gain, dyspnea, and chest tightness.  She was concerned that these symptoms were related to her pharmacy utilizing a different manufacturer for her medication.  Subsequent echo on 03/10/2022 demonstrated an EF of 55%, no regional wall motion abnormalities, grade 1 diastolic dysfunction, normal RV systolic function and ventricular cavity size, no significant valvular abnormalities, and an estimated right atrial pressure of 3 mmHg.  She was last seen in the office in 06/2022 and was without symptoms of angina or cardiac decompensation.  She noted her blood pressure was soft in the mornings.  She was not taking spironolactone secondary to Chief of Staff concerns.  This medication was formerly discontinued given preserved LV systolic function and in the context of low blood pressures in the mornings.  She comes in  and is doing well from a cardiac perspective, without symptoms of angina or cardiac decompensation.  No significant dyspnea.  No palpitations, dizziness, presyncope, or syncope.  No lower extremity swelling.  Her weight is up 7 pounds when  compared to her visit in 06/2022.  This has been attributed to increased caloric intake with elevated A1c.  She prefers to minimize medications when possible.  Does not want to pursue alternative cholesterol-lowering medications.  Overall, she feels very well from a cardiac perspective and does not have any active issues or concerns at this time.   Labs independently reviewed: 12/2022 - potassium 4.0, BUN 11, serum creatinine 0.88, albumin 3.8, AST 43, ALT 37, TC 239, TG 191, HDL 41, LDL 159, Hgb 14.3, PLT 161 10/2022 - A1c 9.9 08/2019 - TSH normal  Past Medical History:  Diagnosis Date   Allergic rhinitis    Basal cell carcinoma 08/17/2014   Left nasal ala- Mohs at [facility]   Cataract    mild    Crohn's disease ([place]) 10/13/2013   Diabetic neuropathy associated with type 2 diabetes mellitus ([place])    neuropathy   Diverticulosis    GERD (gastroesophageal reflux disease)    Gout    HFrEF (heart failure with reduced ejection fraction) ([place])    a. 12/2008 Cath: EF 45% w/ inf HK; b. 07/2009 Echo: EF 50-55%; c. 08/2018 Echo: EF 20-25%.   Hyperlipidemia    Hypertension    IBS (irritable bowel syndrome)    Left bundle branch block    NICM (nonischemic cardiomyopathy) ([place])    a. 12/2008 Cath: no significant dzs, EF 45% w/ inf HK->Med Rx; b. 07/2009 Echo: EF 50-55%; c. 08/2018 Echo: EF 20-25%, ant/antsept HK, mild MR, mildly dil LA, nl RV fx; d. 08/2018 Cath: D1 80, otw nonobs dzs->Med rx.   Non-obstructive CAD (coronary artery disease)    a. 12/2008 Cath: no significant dzs, EF 45% w/ inf HK->Med Rx; b. 08/2018 Cath: LM nl, LAD min irregs, D1 80, LCX 84m/d, RCA min irregs->Med Rx.   Osteoarthritis    Patient has active power of attorney for health care: Oretha [name] 10/04/2020   Symptomatic cholelithiasis    Uncontrolled type 2 diabetes mellitus with stage 3 chronic kidney disease, with long-term current use of insulin          Past Surgical History:  Procedure Laterality Date   BREAST BIOPSY  Right 06/24/2019   stereo bx/ x clip/ neg   BREAST CYST ASPIRATION     CARDIAC CATHETERIZATION     CATARACT EXTRACTION W/PHACO Right 07/26/2021   Procedure: CATARACT EXTRACTION PHACO AND INTRAOCULAR LENS PLACEMENT ([place]) RIGHT DIABETIC;  Surgeon: Birder Robson, MD;  Location[facility];  Service: Ophthalmology;  Laterality: Right;  Diabetic 8.99 01:10.2   COLONOSCOPY     DILATION AND CURETTAGE OF UTERUS     LEFT HEART CATH AND CORONARY ANGIOGRAPHY N/A 04/24/2019   Procedure: LEFT HEART CATH AND CORONARY ANGIOGRAPHY;  Surgeon: Nelva Bush, MD;  Location: [facility];  Service: Cardiovascular;  Laterality: N/A;   MOHS SURGERY     nose    RIGHT/LEFT HEART CATH AND CORONARY ANGIOGRAPHY N/A 08/26/2018   Procedure: RIGHT/LEFT HEART CATH AND CORONARY ANGIOGRAPHY;  Surgeon: Wellington Hampshire, MD;  Location: [facility];  Service: Cardiovascular;  Laterality: N/A;   SHOULDER SURGERY     15 + yrs ago    VAGINAL HYSTERECTOMY      Current Medications: Current Meds  Medication Sig  aspirin EC 81 MG EC tablet Take 1 tablet (81 mg total) by mouth daily.   BD INSULIN SYRINGE U/F 31G X 5/16" 0.5 ML MISC USE AS DIRECTED   carvedilol (COREG) 25 MG tablet Take 1 tablet (25 mg total) by mouth 2 (two) times daily with a meal.   chlorhexidine (PERIDEX) 0.12 % solution as needed.   Ensure (ENSURE) Take 237 mLs by mouth daily.   ENTRESTO 49-51 MG TAKE 1 TABLET BY MOUTH TWICE DAILY   GVOKE HYPOPEN 2-PACK 1 MG/0.2ML SOAJ Inject into the skin.   hydrocortisone 2.5 % cream Apply topically daily. Tuesday, Thursday, Saturday   insulin regular human CONCENTRATED (HUMULIN R) 500 UNIT/ML injection Inject 0.13 mLs (65 Units total) into the skin 3 (three) times daily with meals. Pt is using U-100 needles - dose is 13 units TID as of 09/30/21   ketoconazole (NIZORAL) 2 % cream Apply 1 Application topically daily. Monday, Wednesday, Friday   ONETOUCH ULTRA test strip USE TO CHECK  BLOOD SUGAR UP TO 8 TIMES DAILY   Probiotic Product (ALIGN PO) Take 1 tablet by mouth daily.   [DISCONTINUED] carvedilol (COREG) 12.5 MG tablet TAKE 1 TABLET BY MOUTH TWICE DAILY    Allergies:   Fish oil, Glimepiride, Guanfacine hcl, Other, Rosiglitazone, and Shellfish allergy   Social History   Socioeconomic History   Marital status: Widowed    Spouse name: Not on file   Number of children: Not on file   Years of education: Not on file   Highest education level: Not on file  Occupational History   Not on file  Tobacco Use   Smoking status: Former    Packs/day: 0.75    Years: 30.00    Additional pack years: 0.00    Total pack years: 22.50    Types: Cigarettes    Quit date: 06/06/1997    Years since quitting: 25.5   Smokeless tobacco: Never  Vaping Use   Vaping Use: Never used  Substance and Sexual Activity   Alcohol use: No   Drug use: No   Sexual activity: Yes  Other Topics Concern   Not on file  Social History Narrative   Not on file   Social Determinants of Health   Financial Resource Strain: Low Risk  (02/15/2022)   Overall Financial Resource Strain (CARDIA)    Difficulty of Paying Living Expenses: Not very hard  Food Insecurity: No Food Insecurity (12/06/2022)   Hunger Vital Sign    Worried About Running Out of Food in the Last Year: Never true    [place] in the Last Year: Never true  Transportation Needs: No Transportation Needs (12/06/2022)   PRAPARE - Hydrologist (Medical): No    Lack of Transportation (Non-Medical): No  Physical Activity: Not on file  Stress: Not on file  Social Connections: Moderately Isolated (12/06/2022)   Social Connection and Isolation Panel [NHANES]    Frequency of Communication with Friends and Family: More than three times a week    Frequency of Social Gatherings with Friends and Family: More than three times a week    Attends Religious Services: More than 4 times per year    Active Member of [employer]  or Organizations: No    Attends Archivist Meetings: Never    Marital Status: Widowed     Family History:  The patient's family history includes Hypertension in her father; Kidney failure in her brother and mother. There is no  history of Colon cancer, Colon polyps, Rectal cancer, Stomach cancer, or Breast cancer.  ROS:   12-point review of systems is negative unless otherwise noted in the HPI.   EKGs/Labs/Other Studies Reviewed:    Studies reviewed were summarized above. The additional studies were reviewed today:  2D echo 03/10/2022: 1. Left ventricular ejection fraction, by estimation, is 55%. The left  ventricle has normal function. The left ventricle has no regional wall  motion abnormalities. Left ventricular diastolic parameters are consistent  with Grade I diastolic dysfunction  (impaired relaxation). The average left ventricular global longitudinal  strain is -10.9 %.   2. Right ventricular systolic function is normal. The right ventricular  size is normal.   3. The mitral valve is normal in structure. No evidence of mitral valve  regurgitation. No evidence of mitral stenosis.   4. The aortic valve is normal in structure. Aortic valve regurgitation is  not visualized. No aortic stenosis is present.   5. The inferior vena cava is normal in size with greater than 50%  respiratory variability, suggesting right atrial pressure of 3 mmHg. __________   2D echo 05/11/2020: 1. Left ventricular ejection fraction, by estimation, is 55 to 60%. The  left ventricle has normal function. The left ventricle has no regional  wall motion abnormalities. Left ventricular diastolic parameters are  consistent with Grade I diastolic  dysfunction (impaired relaxation). Elevated left atrial pressure.   2. Right ventricular systolic function is mildly reduced. The right  ventricular size is normal. Tricuspid regurgitation signal is inadequate  for assessing PA pressure.   3. The mitral  valve was not well visualized. Trivial mitral valve  regurgitation. No evidence of mitral stenosis.   4. The aortic valve is tricuspid. Aortic valve regurgitation is not  visualized. No aortic stenosis is present. __________   [facility] 04/24/2019: Conclusions: Stable appearance of coronary arteries since 08/2018 without new lesion to explain recent decline in LVEF or troponin elevation.  I suspect elevated troponin may be due to supply-demand mismatch and/or stress-induced cardiomyopathy in the setting of severe DKA. Focal D1 stenosis remains ~80%.  There is mild to moderate, non-obstructive disease involving the LAD and LCx. Mildly elevated left ventricular filling pressure.   Recommendations: Continue optimization of evidence-based heart failure.  Will continue carvedilol 3.125 mg BID and restart Entresto today. Maintain net even fluid balance; could consider started gentle diuresis tomorrow as renal function allows. Dual antiplatelet therapy with aspirin and clopidogrel for 12 months. Aggressive secondary prevention. __________   2D echo 04/21/2019: 1. Severe hypokinesis of the left ventricular, mid-apical anterior wall  and anteroseptal wall.   2. The left ventricle has moderately reduced systolic function, with an  ejection fraction of 35-40%. The cavity size was normal. Left ventricular  diastolic function could not be evaluated.   3. The right ventricle has normal systolic function. The cavity was  normal. There is mildly increased right ventricular wall thickness.   4. Left atrial size was not well visualized.   5. The aortic valve is tricuspid. Mild thickening of the aortic valve.  Aortic valve regurgitation was not assessed by color flow Doppler. Mild  aortic annular calcification noted.   6. The mitral valve was not well visualized. There is mild mitral annular  calcification present.   7. The aortic root is normal in size and structure.   8. The interatrial septum was not well  visualized. ___________   2D echo 12/10/2018: 1. The left ventricle has low normal systolic function, with  an ejection  fraction of 50-55%. The cavity size was normal. There is moderate  concentric left ventricular hypertrophy. Left ventricular diastolic  Doppler parameters are consistent with  impaired relaxation.   2. The right ventricle has normal systolic function. The cavity was  normal. There is no increase in right ventricular wall thickness.   3. Left atrial size was mildly dilated.   4. The mitral valve is grossly normal.   5. The tricuspid valve is grossly normal.   6. The aortic valve is grossly normal.   7. Significant improvement in EF since last echo. __________   [facility] 08/26/2018: Ost 1st Diag to 1st Diag lesion is 80% stenosed. Mid Cx to Dist Cx lesion is 40% stenosed.   1.  Significant one-vessel coronary artery disease involving first diagonal with mild to moderate disease in the main vessels.   2.  Left ventricular angiography was not performed.  EF was severely reduced by echo. 3.  Right heart catheterization showed normal filling pressures, normal pulmonary pressure and mildly reduced cardiac output at 3.91 with a cardiac index of 2.23.     Recommendations: The patient has nonischemic cardiomyopathy.  Recommend up titration of heart failure medications.  I am going to ask her to increase carvedilol to 6.25 mg twice daily given that she has been taking 3.25 mg twice daily.  Recommend switching lisinopril to Entresto and adding spironolactone.  If ejection fraction remains low after 3 months of optimal medical therapy, consider CRT ICD placement given underlying left bundle branch block. __________   2D echo 08/15/2018: - Left ventricle: The cavity size was mildly dilated. Systolic    function was severely reduced. The estimated ejection fraction    was in the range of 20% to 25%. Hypokinesis of the anterior    myocardium. Hypokinesis of the anteroseptal  myocardium. The study    is not technically sufficient to allow evaluation of LV diastolic    function.  - Mitral valve: There was mild regurgitation.  - Left atrium: The atrium was mildly dilated.  - Right ventricle: Systolic function was normal.  - Pulmonary arteries: Systolic pressure was within the normal    range.   EKG:  EKG is ordered today.  The EKG ordered today demonstrates NSR, 79 bpm, LBBB  Recent Labs: 12/06/2022: ALT 37; BUN 11; Creatinine, Ser 0.88; Hemoglobin 14.3; Platelets 161.0; Potassium 4.0; Sodium 139  Recent Lipid Panel    Component Value Date/Time   CHOL 239 (H) 12/06/2022 1524   TRIG 191.0 (H) 12/06/2022 1524   HDL 41.20 12/06/2022 1524   CHOLHDL 6 12/06/2022 1524   VLDL 38.2 12/06/2022 1524   LDLCALC 159 (H) 12/06/2022 1524   LDLDIRECT 187.0 12/05/2021 1035    PHYSICAL EXAM:    VS:  BP (!) 142/82 (BP Location: Left Arm, Patient Position: Sitting, Cuff Size: Normal)   Pulse 79   Ht 5' 2.5" (1.588 m)   Wt 176 lb (79.8 kg)   SpO2 97%   BMI 31.68 kg/m   BMI: Body mass index is 31.68 kg/m.  Physical Exam Vitals reviewed.  Constitutional:      Appearance: She is well-developed.  HENT:     Head: Normocephalic and atraumatic.  Eyes:     General:        Right eye: No discharge.        Left eye: No discharge.  Neck:     Vascular: No JVD.  Cardiovascular:     Rate and Rhythm: Normal rate and  regular rhythm.     Pulses:          Posterior tibial pulses are 2+ on the right side and 2+ on the left side.     Heart sounds: Normal heart sounds, S1 normal and S2 normal. Heart sounds not distant. No midsystolic click and no opening snap. No murmur heard.    No friction rub.  Pulmonary:     Effort: Pulmonary effort is normal. No respiratory distress.     Breath sounds: Normal breath sounds. No decreased breath sounds, wheezing or rales.  Chest:     Chest wall: No tenderness.  Abdominal:     General: There is no distension.  Musculoskeletal:      Cervical back: Normal range of motion.     Right lower leg: No edema.     Left lower leg: No edema.  Skin:    General: Skin is warm and dry.     Nails: There is no clubbing.  Neurological:     Mental Status: She is alert and oriented to person, place, and time.  Psychiatric:        Speech: Speech normal.        Behavior: Behavior normal.        Thought Content: Thought content normal.        Judgment: Judgment normal.     Wt Readings from Last 3 Encounters:  12/25/22 176 lb (79.8 kg)  12/06/22 175 lb 6 oz (79.5 kg)  12/06/22 172 lb (78 kg)     ASSESSMENT & PLAN:   Nonobstructive CAD: No symptoms suggestive of angina.  LHC from 04/2019 showed stable nonobstructive CAD as outlined above.  Recent echo showed continued preserved LV systolic function.  She remains on carvedilol and Entresto along with aspirin.  No indication for further ischemic testing at this time.  HFimpEF: Euvolemic and well compensated.  Suspect her weight gain is related to caloric intake given elevated A1c.  She remains on Entresto with titrated dose of carvedilol as outlined below.  No longer on spironolactone secondary to pharmaceutical manufacturer concerns and history of low blood pressures.  Should she have recurrence of cardiomyopathy, GDMT would need to be revisited.  HLD: Intolerant to statins, ezetimibe, and PCSK9 inhibitors.  LDL 159 in 12/2022.  Not interested in pursuing alternative lipid-lowering therapies.  She is aware of risks of uncontrolled cholesterol, particularly in the setting of her underlying insulin-dependent diabetes.  HTN: Blood pressure is mildly elevated in the office today at 142/82.  She reports her blood pressures at home have been in the 0000000 to [id number] systolic.  Titrate carvedilol to 25 mg.  She remains on Entresto.  Recent labs showed stable renal function and potassium.   Disposition: F/u with Dr. Rockey Situ or an APP in 6 months.   Medication Adjustments/Labs and Tests  Ordered: Current medicines are reviewed at length with the patient today.  Concerns regarding medicines are outlined above. Medication changes, Labs and Tests ordered today are summarized above and listed in the Patient Instructions accessible in Encounters.   Signed, Christell Faith, PA-C 12/25/2022 3:10 PM     [address] [place], [place] [address] [phone]

## 2023-01-05 DIAGNOSIS — E113412 Type 2 diabetes mellitus with severe nonproliferative diabetic retinopathy with macular edema, left eye: Secondary | ICD-10-CM | POA: Diagnosis not present

## 2023-01-05 DIAGNOSIS — E113411 Type 2 diabetes mellitus with severe nonproliferative diabetic retinopathy with macular edema, right eye: Secondary | ICD-10-CM | POA: Diagnosis not present

## 2023-01-08 ENCOUNTER — Telehealth: Payer: Self-pay

## 2023-01-08 NOTE — Progress Notes (Signed)
Care Management & Coordination Services Pharmacy Team  Reason for Encounter: Appointment Reminder  Contacted patient to confirm telephone appointment with Al Corpus, PharmD on 01/11/2023 at 3:00.  Spoke with patient on 01/08/2023   Do you have any problems getting your medications? No  What is your top health concern you would like to discuss at your upcoming visit? No concerns  Have you seen any other providers since your last visit with PCP? Yes  Star Rating Drugs:  Medication:    Last Fill: Day Supply Sacubitril-Valsartan 49-51 mg            12/21/22          30   Care Gaps: Annual wellness visit in last year? Yes 12/05/2021   If Diabetic: Last eye exam / retinopathy screening: Overdue Last diabetic foot exam: Up to date   Al Corpus, PharmD notified  Claudina Lick, [place] Clinical Pharmacy Assistant [phone]

## 2023-01-11 ENCOUNTER — Telehealth: Payer: Self-pay | Admitting: Pharmacist

## 2023-01-11 ENCOUNTER — Ambulatory Visit: Payer: Medicare HMO | Admitting: Pharmacist

## 2023-01-11 NOTE — Telephone Encounter (Signed)
Care Management & Coordination Services Outreach Note  01/11/2023 Name: Kristy [name] MRN: [id number] DOB: 1951/02/14  Referred by: Hannah Beat, MD  Patient had a phone appointment scheduled with clinical pharmacist today.  An unsuccessful telephone outreach was attempted today. The patient was referred to the pharmacist for assistance with medications, care management and care coordination.   Patient will NOT be penalized in any way for missing a Care Management & Coordination Services appointment. The no-show fee does not apply.  If possible, a message was left to return call to: [phone] or to [facility].  Al Corpus, PharmD, BCACP Clinical Pharmacist [facility] [phone]

## 2023-01-11 NOTE — Progress Notes (Signed)
Care Management & Coordination Services Pharmacy Note  01/15/2023 Name:  Kristy [name] MRN:  [id number] DOB:  Nov 25, 1950  Summary: F/U visit -DM: A1c 9.9% (10/2022), pt has longstanding uncontrolled DM for years and A1c is actually improved and more stable now; she has established with new endocrinologist (Dr Ledon Snare); she is wearing [employer] and reports average glucose 195 -HF/HTN: pt reports BP at goal with higher dose of carvedilol -HLD/CAD: LDL 159 (12/2022); pt has not tolerated statins, Repatha, zetia and has declined further lipid-lowering therapies  Recommendations/Changes made from today's visit: -No med changes; f/u with Dr Ledon Snare as scheduled; consider GLP-1 RA in future -Discussed health maintenance screenings: Encouraged her to schedule DEXA, Mammogram, Eye exam  Follow up plan: -Pharmacist follow up televisit scheduled for 3 months -Endocrine 04/02/23; PCP 06/11/23; Cardiology 06/28/23    Subjective: Kristy [name] is an 72 y.o. year old female who is a primary patient of Copland, Karleen Hampshire, MD.  The care coordination team was consulted for assistance with disease management and care coordination needs.    Engaged with patient by telephone for follow up visit.  Recent office visits: 12/06/22 Dr Patsy Lager OV: annual  06/07/22 Dr Copland OV: A1c 9.7%. GMI 8.5%; D/C ivabradine (pt preference).   Recent consult visits: 12/25/22 PA Dunn (Cardiology):   10/23/22 Dr Ledon Snare (Endocrine): initial consult - A1c 9.9%; give insulin after meals, adjust U-500 to 15 units (75 units) when giving before 5 pm, 12 units (60 units) when giving after 5 pm; Rx Glucagon (Gvoke); upgrade to [place] 3 through PCP office  08/31/22 Dr Gwen Pounds (Dermatology): keratosis. Rx hydrocortisone, ketoconazole 2%  06/06/22 Eula Listen PA (Cardiology): d/c spironolactone per pt preference   Hospital visits: None in previous 6 months   Objective:  Lab Results  Component Value Date   CREATININE 0.88  12/06/2022   BUN 11 12/06/2022   GFR 66.08 12/06/2022   GFRNONAA 70 04/08/2020   GFRAA 80 04/08/2020   NA 139 12/06/2022   K 4.0 12/06/2022   CALCIUM 10.4 12/06/2022   CO2 30 12/06/2022   GLUCOSE 232 (H) 12/06/2022    Lab Results  Component Value Date/Time   HGBA1C 9.7 (A) 06/07/2022 02:31 PM   HGBA1C 9.4 (H) 12/05/2021 10:35 AM   HGBA1C 11.8 (A) 04/13/2021 02:52 PM   HGBA1C 9.7 (H) 12/08/2019 03:00 PM   HGBA1C >14.0 12/02/2018 12:00 AM   GFR 66.08 12/06/2022 03:24 PM   GFR 52.63 (L) 12/05/2021 10:35 AM   MICROALBUR 32.1 (H) 12/06/2022 03:24 PM   MICROALBUR 18.1 (H) 12/10/2018 10:08 AM    Last diabetic Eye exam:  Lab Results  Component Value Date/Time   HMDIABEYEEXA Retinopathy (A) 04/11/2021 12:00 AM    Last diabetic Foot exam: No results found for: "HMDIABFOOTEX"   Lab Results  Component Value Date   CHOL 239 (H) 12/06/2022   HDL 41.20 12/06/2022   LDLCALC 159 (H) 12/06/2022   LDLDIRECT 187.0 12/05/2021   TRIG 191.0 (H) 12/06/2022   CHOLHDL 6 12/06/2022       Latest Ref Rng & Units 12/06/2022    3:24 PM 12/05/2021   10:35 AM 10/04/2020    4:19 PM  Hepatic Function  Total Protein 6.0 - 8.3 g/dL 6.9  7.1  7.3   Albumin 3.5 - 5.2 g/dL 3.8  4.2  4.5   AST 0 - 37 U/L 43  21  22   ALT 0 - 35 U/L 37  17  19   Alk Phosphatase 39 - 117  U/L 98  69  81   Total Bilirubin 0.2 - 1.2 mg/dL 0.5  0.6  0.5   Bilirubin, Direct 0.0 - 0.3 mg/dL  0.1  0.1     Lab Results  Component Value Date/Time   TSH 1.325 08/07/2019 01:21 PM   TSH 0.586 04/19/2019 06:54 PM   TSH 3.88 07/11/2018 08:46 AM   TSH 1.13 01/10/2017 03:15 PM   FREET4 1.04 04/19/2019 06:54 PM   FREET4 0.98 09/07/2014 02:43 PM       Latest Ref Rng & Units 12/06/2022    3:24 PM 12/05/2021   10:35 AM 10/04/2020    4:19 PM  CBC  WBC 4.0 - 10.5 K/uL 8.9  9.5  9.6   Hemoglobin 12.0 - 15.0 g/dL 70.1  41.0  30.1   Hematocrit 36.0 - 46.0 % 43.7  41.6  43.3   Platelets 150.0 - 400.0 K/uL 161.0  167.0  181.0     Lab  Results  Component Value Date/Time   VITAMINB12 491 08/07/2019 01:21 PM    Clinical ASCVD: Yes  The ASCVD Risk score (Arnett DK, et al., 2019) failed to calculate for the following reasons:   The patient has a prior MI or stroke diagnosis        12/05/2021   10:25 AM 07/31/2019   12:20 PM 02/12/2019   11:13 AM  Depression screen PHQ 2/9  Decreased Interest 0 0 3  Down, Depressed, Hopeless 0 2 0  PHQ - 2 Score 0 2 3  Altered sleeping  1 0  Tired, decreased energy  1 3  Change in appetite  1 3  Feeling bad or failure about yourself   0 0  Trouble concentrating  0 0  Moving slowly or fidgety/restless  0 2  Suicidal thoughts  0 0  PHQ-9 Score  5 11  Difficult doing work/chores   Not difficult at all     Social History   Tobacco Use  Smoking Status Former   Packs/day: 0.75   Years: 30.00   Additional pack years: 0.00   Total pack years: 22.50   Types: Cigarettes   Quit date: 06/06/1997   Years since quitting: 25.6  Smokeless Tobacco Never   BP Readings from Last 3 Encounters:  12/25/22 (!) 142/82  12/06/22 (!) 150/90  08/31/22 127/69   Pulse Readings from Last 3 Encounters:  12/25/22 79  12/06/22 83  08/31/22 73   Wt Readings from Last 3 Encounters:  12/25/22 176 lb (79.8 kg)  12/06/22 175 lb 6 oz (79.5 kg)  12/06/22 172 lb (78 kg)   BMI Readings from Last 3 Encounters:  12/25/22 31.68 kg/m  12/06/22 31.57 kg/m  12/06/22 29.99 kg/m    Allergies  Allergen Reactions   Rosiglitazone Other (See Comments)    CHF   Fish Oil Other (See Comments)    Gout   Glimepiride Other (See Comments)    REACTION: hypoglycemia   Other Other (See Comments)    Splenda caused sugars levels to increase   Repatha [Evolocumab] Other (See Comments)    "Felt awful for 2 weeks"   Shellfish Allergy Other (See Comments)    Gout   Guanfacine Hcl Other (See Comments)    REACTION: unspecified   Metformin And Related    Statins Other (See Comments)    Myalgias - atorvastatin  80, rosuvastatin 5-10, pravastatin 10   Invokana [Canagliflozin] Other (See Comments)    Increased thirst   Spironolactone Other (See Comments)  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 Sig Documenting Provider Last Dose Status Informant  aspirin EC 81 MG EC tablet [id number] Yes Take 1 tablet (81 mg total) by mouth daily. Milagros LollSudini, Srikar, MD Taking Active Other  BD INSULIN SYRINGE U/F 31G X 5/16" 0.5 ML MISC [id number] Yes USE AS DIRECTED Copland, Karleen HampshireSpencer, MD Taking Active   carvedilol (COREG) 12.5 MG tablet [id number] Yes TAKE 1 TABLET BY MOUTH TWICE DAILY Gollan, Tollie Pizzaimothy J, MD Taking Active   chlorhexidine (PERIDEX) 0.12 % solution [id number] Yes as needed. [provider] Taking Active   Ensure [facility](ENSURE) [id number] Yes Take 237 mLs by mouth daily. [provider] Taking Active Other  ENTRESTO 49-51 MG [id number] Yes TAKE 1 TABLET BY MOUTH TWICE DAILY Gollan, Tollie Pizzaimothy J, MD Taking Active   GVOKE HYPOPEN 2-PACK 1 MG/0.2ML SOAJ [id number] Yes Inject into the skin. [provider] Taking Active   hydrocortisone 2.5 % cream [id number] Yes Apply topically daily. Tuesday, Thursday, Saturday Deirdre EvenerKowalski, David C, MD Taking Active   insulin regular human CONCENTRATED (HUMULIN R) 500 UNIT/ML injection [id number] Yes Inject 0.13 mLs (65 Units total) into the skin 3 (three) times daily with meals. Pt is using U-100 needles - dose is 13 units TID as of 09/30/21 Hannah Beatopland, Spencer, MD Taking Active            Med Note [facility](MCMULLEN, CHRISTAN M   Wed Dec 06, 2022 12:26 PM) Injecting 15u BID  ketoconazole (NIZORAL) 2 % cream [id number] Yes Apply 1 Application topically daily. Monday, Wednesday, Friday Deirdre EvenerKowalski, David C, MD Taking Active   [facility]  ULTRA test strip [id number] Yes USE TO CHECK BLOOD SUGAR UP TO 8 TIMES DAILY Copland, Karleen HampshireSpencer, MD Taking Active   Probiotic Product (ALIGN PO) [id number] Yes Take 1 tablet by mouth daily. [provider] Taking Active             SDOH:  (Social Determinants of Health) assessments and interventions performed: No SDOH Interventions    Flowsheet Row Clinical Support [facility] from [facility]  SDOH Interventions      Food Insecurity Interventions Intervention Not Indicated Intervention Not Indicated -- --  Housing Interventions Intervention Not Indicated -- -- --  Transportation Interventions Intervention Not Indicated -- -- --  Utilities Interventions Intervention Not Indicated -- -- --  Financial Strain Interventions -- Other (Comment)  Julious Oka[[employer] C[employer] - Humulin] Other (Comment)  [Assistance applied/approved U-500 insulin] Intervention Not Indicated  [Meds affordable]  Social Connections Interventions Intervention Not Indicated -- -- --       Medication Assistance:  Humulin R U-500 - [employer] C[employer] approved 2024  Medication Access: Within the past 30 days, how often has patient missed a dose of medication? 0 Is a pillbox or other method used to improve adherence? Yes  Factors that may affect medication adherence? no barriers identified Are meds synced by current pharmacy? No  Are meds delivered by current pharmacy? No  Does patient experience delays in picking up  medications due to transportation concerns? No   [facility] Reviewed: Is patient disadvantaged to use [facility]?: Yes  Current Rx insurance plan: [employer] Name and location of Current pharmacy:  [facility] - [place], [place] - [address] [place] [address] Phone: [phone] Fax: [phone]  [facility] services reviewed with patient today?: No  Patient requests to transfer care to [facility]?: No  Reason patient declined to change pharmacies: Disadvantaged due to insurance/mail order  Compliance/Adherence/Medication fill history: Care Gaps: DEXA (never done) - ordered 12/06/22. Discussed, encouraged pt to schedule test. Given phone number for [facility] UACR (due 12/2019) Mammogram (due 01/2022) - pt reports bad experience with previous mammogram. Encouraged her to schedule at a different office. Given phone number for [facility].  Eye exam (due 04/2022) - encouraged scheduling with [facility]  Star-Rating Drugs: None   ASSESSMENT / PLAN  Hypertension / Heart Failure (BP goal <140/90) -Controlled - BP at goal at home -Current home readings: 123/67 -LVEF: 55-60% (05/11/20) - improved, previously < 40% -HF type: HFimpEF; NYHA class I symptoms -Follows with cardiology (Dr Mariah Milling) -Current treatment: Carvedilol 25 mg BID - Appropriate, Effective, Safe, Accessible Entresto 49-51 mg BID -Appropriate, Effective, Safe, Accessible -Medications previously tried: spironolactone, ivabradine -Counseled to monitor BP at home periodically -Recommended to continue current medication  Diabetes (A1c goal <8%) -Uncontrolled, improved - A1c 9.9% (10/2022), improved from 11.2% after receiving Humulin R U-500 in vials instead of pens, for some reason the Vial insulin is working better; she reports taking insulin twice a day typically (~9am and before 5 pm); pt reports avg glucose per [employer] is 195, similar to 2 months ago -DM history: Diagnosed with type 2 diabetes 1998, transitioned to insulin 2005. Multiple episodes of DKA. Pt has seen multiple endocrinologist and frequently lost to follow up. She recently established with Dr Ledon Snare [facility] on [facility])  10/2022 -Current home glucose readings - via FL2. Using reader device.  Previous AGP report: 11/23/22 to 12/06/22. Sensor active: 98%  Time in range (70-180): 45% (goal > 70%)  High (180-250): 24%  Very high (>250): 28%  Low (< 70): 3% (goal < 4%)  GMI: 8.0%; Average glucose: 196  -Current medications: Humulin R U-500 - 75 units (15u w/ U-100 syringe) 2x daily w/ meals (PAP)- Appropriate, Query Effective Freestyle Libre 2 ([facility]) - Appropriate, Effective, Safe, Accessible Gvoke Hypopen - Appropriate, Effective, Safe, Accessible -Medications previously tried: Lantus (pt reports ineffective); rosiglitazone (HF-pt insists this is the reason she developed HF), glimepiride (hypoglycemia); metformin (unknown); Invokana (increased thirst); Bydureon (cost/difficult administration) -Consider adding GLP-1 RA in future -Recommend to continue current medication  Hyperlipidemia/CAD: (LDL goal < 70) -Uncontrolled - LDL 159 (11/2021). LDL previously <130 in 2011, jumped to 195 in 2015. She has not been able to tolerate any cholesterol medication. -Hx CAD (single-vessel disease, medical mgmt); hx stroke x2 per patient -Current treatment: None -Medications previously tried: atorvastatin 80, rosuvastatin 5-10, pravastatin 10, Zetia, Repatha (felt awful x 2 wks) -Educated on Cholesterol goals; discussed majority of cholesterol made endogenously and most medications target this; discussed Praluent, Nexletol and Leqvio - last remaining options for LDL lowering - she would be a candidate for Leqvio but would need to discuss with cardiology/lipid clinic; she has declined to pursue alternative lipid-lowering therapies  Health Maintenance -Hx Crohn's/IBS   Al Corpus, PharmD, BCACP Clinical Pharmacist [facility] [phone]

## 2023-01-17 DIAGNOSIS — E113412 Type 2 diabetes mellitus with severe nonproliferative diabetic retinopathy with macular edema, left eye: Secondary | ICD-10-CM | POA: Diagnosis not present

## 2023-01-17 DIAGNOSIS — E113411 Type 2 diabetes mellitus with severe nonproliferative diabetic retinopathy with macular edema, right eye: Secondary | ICD-10-CM | POA: Diagnosis not present

## 2023-01-18 ENCOUNTER — Ambulatory Visit: Payer: Medicare HMO | Admitting: Podiatry

## 2023-01-18 ENCOUNTER — Encounter: Payer: Self-pay | Admitting: Podiatry

## 2023-01-18 VITALS — BP 157/71 | HR 77

## 2023-01-18 DIAGNOSIS — B351 Tinea unguium: Secondary | ICD-10-CM | POA: Diagnosis not present

## 2023-01-18 DIAGNOSIS — M79675 Pain in left toe(s): Secondary | ICD-10-CM

## 2023-01-18 DIAGNOSIS — E0865 Diabetes mellitus due to underlying condition with hyperglycemia: Secondary | ICD-10-CM

## 2023-01-18 DIAGNOSIS — M79674 Pain in right toe(s): Secondary | ICD-10-CM | POA: Diagnosis not present

## 2023-01-18 NOTE — Progress Notes (Signed)
This patient returns to my office for at risk foot care.  This patient requires this care by a professional since this patient will be at risk due to having diabetes and kidney disease.  This patient is unable to cut nails herself since the patient cannot reach her nails.These nails are painful walking and wearing shoes.  This patient presents for at risk foot care today.  General Appearance  Alert, conversant and in no acute stress.  Vascular  Dorsalis pedis and posterior tibial  pulses are palpable  bilaterally.  Capillary return is within normal limits  bilaterally. Temperature is within normal limits  bilaterally.  Neurologic  Senn-Weinstein monofilament wire test within normal limits  bilaterally. Muscle power within normal limits bilaterally.  Nails  No evidence of bacterial infection or drainage bilaterally.  Pincer hallux nails.  Orthopedic  No limitations of motion  feet .  No crepitus or effusions noted.  No bony pathology or digital deformities noted.  Skin  normotropic skin with no porokeratosis noted bilaterally.  No signs of infections or ulcers noted.     Onychomycosis  Pain in right toes  Pain in left toes  Consent was obtained for treatment procedures.   Mechanical debridement of nails 1-5  bilaterally performed with a nail nipper.  Filed with dremel without incident.    Return office visit  3 months                    Told patient to return for periodic foot care and evaluation due to potential at risk complications.   Jaiveon Suppes DPM   

## 2023-01-25 ENCOUNTER — Other Ambulatory Visit: Payer: Self-pay | Admitting: Cardiovascular Disease

## 2023-02-02 DIAGNOSIS — E113411 Type 2 diabetes mellitus with severe nonproliferative diabetic retinopathy with macular edema, right eye: Secondary | ICD-10-CM | POA: Diagnosis not present

## 2023-02-02 DIAGNOSIS — E113412 Type 2 diabetes mellitus with severe nonproliferative diabetic retinopathy with macular edema, left eye: Secondary | ICD-10-CM | POA: Diagnosis not present

## 2023-02-15 DIAGNOSIS — E113412 Type 2 diabetes mellitus with severe nonproliferative diabetic retinopathy with macular edema, left eye: Secondary | ICD-10-CM | POA: Diagnosis not present

## 2023-02-15 DIAGNOSIS — E113411 Type 2 diabetes mellitus with severe nonproliferative diabetic retinopathy with macular edema, right eye: Secondary | ICD-10-CM | POA: Diagnosis not present

## 2023-03-02 DIAGNOSIS — E113411 Type 2 diabetes mellitus with severe nonproliferative diabetic retinopathy with macular edema, right eye: Secondary | ICD-10-CM | POA: Diagnosis not present

## 2023-03-02 DIAGNOSIS — E113412 Type 2 diabetes mellitus with severe nonproliferative diabetic retinopathy with macular edema, left eye: Secondary | ICD-10-CM | POA: Diagnosis not present

## 2023-03-02 DIAGNOSIS — Z794 Long term (current) use of insulin: Secondary | ICD-10-CM | POA: Diagnosis not present

## 2023-03-02 DIAGNOSIS — E1165 Type 2 diabetes mellitus with hyperglycemia: Secondary | ICD-10-CM | POA: Diagnosis not present

## 2023-03-20 ENCOUNTER — Other Ambulatory Visit: Payer: Self-pay | Admitting: Family Medicine

## 2023-03-21 DIAGNOSIS — E113411 Type 2 diabetes mellitus with severe nonproliferative diabetic retinopathy with macular edema, right eye: Secondary | ICD-10-CM | POA: Diagnosis not present

## 2023-03-21 DIAGNOSIS — E113412 Type 2 diabetes mellitus with severe nonproliferative diabetic retinopathy with macular edema, left eye: Secondary | ICD-10-CM | POA: Diagnosis not present

## 2023-03-30 DIAGNOSIS — E113412 Type 2 diabetes mellitus with severe nonproliferative diabetic retinopathy with macular edema, left eye: Secondary | ICD-10-CM | POA: Diagnosis not present

## 2023-03-30 DIAGNOSIS — E113411 Type 2 diabetes mellitus with severe nonproliferative diabetic retinopathy with macular edema, right eye: Secondary | ICD-10-CM | POA: Diagnosis not present

## 2023-04-17 ENCOUNTER — Encounter: Payer: Self-pay | Admitting: Pharmacist

## 2023-04-17 NOTE — Progress Notes (Signed)
Patient previously followed by [facility] pharmacist. Per clinical review, no pharmacist appointment needed at this time. Patient followed by endocrinology. Will make pharmacy patient advocate team aware of Humulin R patient assistance.  Care guide directed to contact patient and cancel appointment and notify pharmacy team of any patient concerns.

## 2023-04-20 DIAGNOSIS — E113412 Type 2 diabetes mellitus with severe nonproliferative diabetic retinopathy with macular edema, left eye: Secondary | ICD-10-CM | POA: Diagnosis not present

## 2023-04-23 ENCOUNTER — Encounter: Payer: Medicare HMO | Admitting: Pharmacist

## 2023-04-23 ENCOUNTER — Encounter: Payer: Self-pay | Admitting: Podiatry

## 2023-04-23 ENCOUNTER — Ambulatory Visit: Payer: Medicare HMO | Admitting: Podiatry

## 2023-04-23 VITALS — BP 152/80

## 2023-04-23 DIAGNOSIS — M79674 Pain in right toe(s): Secondary | ICD-10-CM | POA: Diagnosis not present

## 2023-04-23 DIAGNOSIS — B351 Tinea unguium: Secondary | ICD-10-CM | POA: Diagnosis not present

## 2023-04-23 DIAGNOSIS — M79675 Pain in left toe(s): Secondary | ICD-10-CM

## 2023-04-23 DIAGNOSIS — L84 Corns and callosities: Secondary | ICD-10-CM | POA: Diagnosis not present

## 2023-04-23 DIAGNOSIS — E0865 Diabetes mellitus due to underlying condition with hyperglycemia: Secondary | ICD-10-CM | POA: Diagnosis not present

## 2023-04-27 DIAGNOSIS — E113411 Type 2 diabetes mellitus with severe nonproliferative diabetic retinopathy with macular edema, right eye: Secondary | ICD-10-CM | POA: Diagnosis not present

## 2023-04-28 NOTE — Progress Notes (Signed)
  Subjective:  Patient ID: Kristy [name], female    DOB: 1951/04/05,  MRN: [id number]  Albin Felling presents to clinic today for preventative diabetic foot care and painful elongated mycotic toenails 1-5 bilaterally which are tender when wearing enclosed shoe gear. Pain is relieved with periodic professional debridement.  Chief Complaint  Patient presents with   Nail Problem    DFC,Referring Provider Copland, Karleen Hampshire, MD,lov:03/24,A1C:9.7      She states she hit her right great toe during the night going to the bathroom. Toe is getting better.  PCP is Copland, Karleen Hampshire, MD.  Allergies  Allergen Reactions   Rosiglitazone Other (See Comments)    CHF   Fish Oil Other (See Comments)    Gout   Glimepiride Other (See Comments)    REACTION: hypoglycemia   Other Other (See Comments)    Splenda caused sugars levels to increase   Repatha [Evolocumab] Other (See Comments)    "Felt awful for 2 weeks"   Shellfish Allergy Other (See Comments)    Gout   Guanfacine Hcl Other (See Comments)    REACTION: unspecified   Metformin And Related    Statins Other (See Comments)    Myalgias - atorvastatin 80, rosuvastatin 5-10, pravastatin 10   Invokana [Canagliflozin] Other (See Comments)    Increased thirst   Spironolactone Other (See Comments)    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  Objective: No changes noted in today's physical examination. Vitals:   04/23/23 1501  BP: (!) 152/80   Kristy [name] is a pleasant 72 y.o. female in NAD. AAO x 3.  Vascular Examination: Capillary refill time immediate b/l. Vascular status intact b/l with palpable pedal pulses. Pedal hair present b/l. No pain with calf compression b/l. Skin temperature gradient WNL b/l. No cyanosis or clubbing b/l. No ischemia or gangrene noted b/l.   Neurological  Examination: Sensation grossly intact b/l with 10 gram monofilament.   Dermatological Examination: Pedal skin with normal turgor, texture and tone b/l.  No open wounds. No interdigital macerations.   Toenails 1-5 b/l thick, discolored, elongated with subungual debris and pain on dorsal palpation.   Hyperkeratotic lesion(s) left great toe.  No erythema, no edema, no drainage, no fluctuance.  Musculoskeletal Examination: Muscle strength 5/5 to all lower extremity muscle groups bilaterally. No pain, crepitus or joint limitation noted with ROM bilateral LE.  Radiographs: None  Last A1c:      Latest Ref Rng & Units 06/07/2022    2:31 PM  Hemoglobin A1C  Hemoglobin-A1c 4.0 - 5.6 % 9.7    Assessment/Plan: 1. Pain due to onychomycosis of toenails of both feet   2. Callus   3. Diabetes mellitus due to underlying condition, uncontrolled, with hyperglycemia (HCC)     -Patient was evaluated and treated. All patient's and/or POA's questions/concerns answered on today's visit. -Patient to continue soft, supportive shoe gear daily. -Toenails 1-5 b/l were debrided in length and girth with sterile nail nippers and dremel without iatrogenic bleeding.  -Callus(es) left great toe pared utilizing sterile scalpel blade without complication or incident. Total number debrided =1. -Patient/POA to call should there be question/concern in the interim.   Return in about 3 months (around 07/24/2023).  Freddie Breech, DPM

## 2023-05-16 ENCOUNTER — Encounter: Payer: Self-pay | Admitting: Dermatology

## 2023-05-16 ENCOUNTER — Ambulatory Visit: Payer: Medicare HMO | Admitting: Dermatology

## 2023-05-16 DIAGNOSIS — D1801 Hemangioma of skin and subcutaneous tissue: Secondary | ICD-10-CM | POA: Diagnosis not present

## 2023-05-16 DIAGNOSIS — L918 Other hypertrophic disorders of the skin: Secondary | ICD-10-CM

## 2023-05-16 DIAGNOSIS — L820 Inflamed seborrheic keratosis: Secondary | ICD-10-CM | POA: Diagnosis not present

## 2023-05-16 DIAGNOSIS — Z1283 Encounter for screening for malignant neoplasm of skin: Secondary | ICD-10-CM | POA: Diagnosis not present

## 2023-05-16 DIAGNOSIS — L821 Other seborrheic keratosis: Secondary | ICD-10-CM

## 2023-05-16 DIAGNOSIS — D229 Melanocytic nevi, unspecified: Secondary | ICD-10-CM

## 2023-05-16 DIAGNOSIS — L814 Other melanin hyperpigmentation: Secondary | ICD-10-CM | POA: Diagnosis not present

## 2023-05-16 DIAGNOSIS — L578 Other skin changes due to chronic exposure to nonionizing radiation: Secondary | ICD-10-CM

## 2023-05-16 DIAGNOSIS — W908XXA Exposure to other nonionizing radiation, initial encounter: Secondary | ICD-10-CM | POA: Diagnosis not present

## 2023-05-16 NOTE — Patient Instructions (Signed)

## 2023-05-16 NOTE — Progress Notes (Signed)
Follow-Up Visit   Subjective  Kristy [name] is a 72 y.o. female who presents for the following: Skin Cancer Screening and Upper Body Skin Exam  The patient presents for Upper Body Skin Exam (UBSE) for skin cancer screening and mole check. The patient has spots, moles and lesions to be evaluated, some may be new or changing and the patient may have concern these could be cancer.   The following portions of the chart were reviewed this encounter and updated as appropriate: medications, allergies, medical history  Review of Systems:  No other skin or systemic complaints except as noted in HPI or Assessment and Plan.  Objective  Well appearing patient in no apparent distress; mood and affect are within normal limits.  All skin waist up examined. Relevant physical exam findings are noted in the Assessment and Plan.  Back x 16, left elow x 1, left arm x 1, left axilla x 1 (19) Erythematous stuck-on, waxy papule or plaque  Left Axilla x 8, right axilla x 6 (14) Fleshy, skin-colored pedunculated papules.      Assessment & Plan   Inflamed seborrheic keratosis (19) Back x 16, left elow x 1, left arm x 1, left axilla x 1  Symptomatic, irritating, patient would like treated.  Benign-appearing.  Call clinic for new or changing lesions.    Destruction of lesion - Back x 16, left elow x 1, left arm x 1, left axilla x 1 (19) Complexity: simple   Destruction method: cryotherapy   Informed consent: discussed and consent obtained   Timeout:  patient name, date of birth, surgical site, and procedure verified Lesion destroyed using liquid nitrogen: Yes   Region frozen until ice ball extended beyond lesion: Yes   Outcome: patient tolerated procedure well with no complications   Post-procedure details: wound care instructions given    Skin tag (14) Left Axilla x 8, right axilla x 6 Symptomatic, irritating, patient would like treated.  Destruction of lesion - Left Axilla x 8, right  axilla x 6 (14) Complexity: simple   Destruction method: cryotherapy   Informed consent: discussed and consent obtained   Timeout:  patient name, date of birth, surgical site, and procedure verified Lesion destroyed using liquid nitrogen: Yes   Region frozen until ice ball extended beyond lesion: Yes   Outcome: patient tolerated procedure well with no complications   Post-procedure details: wound care instructions given     Skin cancer screening performed today.  Actinic Damage - Chronic condition, secondary to cumulative UV/sun exposure - diffuse scaly erythematous macules with underlying dyspigmentation - Recommend daily broad spectrum sunscreen SPF 30+ to sun-exposed areas, reapply every 2 hours as needed.  - Staying in the shade or wearing long sleeves, sun glasses (UVA+UVB protection) and wide brim hats (4-inch brim around the entire circumference of the hat) are also recommended for sun protection.  - Call for new or changing lesions.  Lentigines, Seborrheic Keratoses, Hemangiomas - Benign normal skin lesions - Benign-appearing - Call for any changes  Melanocytic Nevi - Tan-brown and/or pink-flesh-colored symmetric macules and papules - Benign appearing on exam today - Observation - Call clinic for new or changing moles - Recommend daily use of broad spectrum spf 30+ sunscreen to sun-exposed areas.    Return in about 3 months (around 08/16/2023).  I, Joanie Coddington, CMA, am acting as scribe for Armida Sans, MD .   Documentation: I have reviewed the above documentation for accuracy and completeness, and I agree with the above.  Armida Sans, MD

## 2023-05-17 ENCOUNTER — Encounter (INDEPENDENT_AMBULATORY_CARE_PROVIDER_SITE_OTHER): Payer: Self-pay

## 2023-05-21 DIAGNOSIS — E113412 Type 2 diabetes mellitus with severe nonproliferative diabetic retinopathy with macular edema, left eye: Secondary | ICD-10-CM | POA: Diagnosis not present

## 2023-05-27 ENCOUNTER — Encounter: Payer: Self-pay | Admitting: Dermatology

## 2023-05-31 ENCOUNTER — Telehealth: Payer: Self-pay | Admitting: Family Medicine

## 2023-05-31 DIAGNOSIS — E1165 Type 2 diabetes mellitus with hyperglycemia: Secondary | ICD-10-CM | POA: Diagnosis not present

## 2023-05-31 DIAGNOSIS — Z794 Long term (current) use of insulin: Secondary | ICD-10-CM | POA: Diagnosis not present

## 2023-05-31 MED ORDER — HUMULIN R U-500 (CONCENTRATED) 500 UNIT/ML ~~LOC~~ SOLN: 65.0000 [IU] | Freq: Three times a day (TID) | SUBCUTANEOUS | 1 refills | Status: DC

## 2023-05-31 MED ORDER — HUMULIN R U-500 (CONCENTRATED) 500 UNIT/ML ~~LOC~~ SOLN: 65.0000 [IU] | Freq: Two times a day (BID) | SUBCUTANEOUS | 1 refills | Status: DC

## 2023-05-31 NOTE — Telephone Encounter (Signed)
Resent refill with correct dosing instructions.  I called and spoke with Terri and she states she is taking 65 units twice a day.

## 2023-05-31 NOTE — Telephone Encounter (Signed)
Pharmacy called asking about instructions for medication insulin regular human CONCENTRATED (HUMULIN R) 500 UNIT/ML injection ,she said that the dosage has two different instructions. She is requesting a phone call for clarification.

## 2023-05-31 NOTE — Telephone Encounter (Signed)
Refill sent as requested. 

## 2023-05-31 NOTE — Telephone Encounter (Signed)
Prescription Request  05/31/2023  LOV: 12/06/2022  What is the name of the medication or equipment? insulin regular human CONCENTRATED (HUMULIN R) 500 UNIT/ML injection   Have you contacted your pharmacy to request a refill? No   Which pharmacy would you like this sent to?   [facility] - [place], [place] - [address] [place] [address] Phone: [phone] Fax: [phone]    Patient notified that their request is being sent to the clinical staff for review and that they should receive a response within 2 business days.   Please advise at [facility] [phone]

## 2023-06-01 ENCOUNTER — Telehealth: Payer: Medicare HMO | Admitting: Family Medicine

## 2023-06-01 DIAGNOSIS — E1140 Type 2 diabetes mellitus with diabetic neuropathy, unspecified: Secondary | ICD-10-CM

## 2023-06-01 NOTE — Telephone Encounter (Signed)
[employer] called with a  question about medication insulin regular human CONCENTRATED (HUMULIN R) 500 UNIT/ML injection , to try and get PA approved.They are needing  to  know if this is being administered with an insulin pump?

## 2023-06-05 ENCOUNTER — Other Ambulatory Visit (HOSPITAL_COMMUNITY): Payer: Self-pay

## 2023-06-05 ENCOUNTER — Telehealth: Payer: Self-pay | Admitting: Family Medicine

## 2023-06-05 NOTE — Telephone Encounter (Signed)
  insulin regular human CONCENTRATED (HUMULIN R) 500 UNIT/ML injection  Patient is needing prior authorization for this medication, please advise

## 2023-06-06 ENCOUNTER — Other Ambulatory Visit (HOSPITAL_COMMUNITY): Payer: Self-pay

## 2023-06-06 NOTE — Telephone Encounter (Signed)
Please contact pt or provider to verify as this is not within our usual workflow.

## 2023-06-06 NOTE — Telephone Encounter (Signed)
Pharmacy Patient Advocate Encounter   Received notification from Pt Calls Messages that prior authorization for Humulin R U-500 (CONCENTRATED) is required/requested.   Insurance verification completed.   The patient is insured through [employer] .   Per test claim:  Humulin R U-500 KWIKPEN is preferred by the insurance.  If suggested medication is appropriate, Please send in a new RX and discontinue this one. If not, please advise as to why it's not appropriate so that we may request a Prior Authorization.

## 2023-06-07 MED ORDER — PEN NEEDLES 31G X 8 MM MISC: 3 refills | Status: AC

## 2023-06-07 MED ORDER — INSULIN REGULAR HUMAN (CONC) 500 UNIT/ML ~~LOC~~ SOPN: 65.0000 [IU] | PEN_INJECTOR | Freq: Two times a day (BID) | SUBCUTANEOUS | 2 refills | Status: DC

## 2023-06-07 NOTE — Telephone Encounter (Signed)
Patient called in stating that [facility] isn't able to get the Humulin 500 in a vial since she's unable to tolerate the pen. She would like to know will Dr Patsy Lager try to get it from [facility],where she has been getting it for free?

## 2023-06-07 NOTE — Telephone Encounter (Signed)
No her insulin is not administered with an insulin pump.  Patient injects her insulin.

## 2023-06-07 NOTE — Telephone Encounter (Signed)
Rx for [employer] and Pen Needles sent to [facility]

## 2023-06-07 NOTE — Telephone Encounter (Signed)
Can you please help patient with patient assistance for Humulin R-500 Concentrated Vials.

## 2023-06-07 NOTE — Telephone Encounter (Signed)
Humulin R Concentrated 500unit/ml Stephanie Coup is covered, vials are not. Please change if appropriate, otherwise, please provide letter/rationale for medical necessity. Thanks

## 2023-06-07 NOTE — Addendum Note (Signed)
Addended by: Damita Lack on: 06/07/2023 10:07 AM   Modules accepted: Orders

## 2023-06-07 NOTE — Telephone Encounter (Signed)
Rx for Humulin R U-500 concentrated Kwikpens and Pen Needles sent to [facility].

## 2023-06-08 ENCOUNTER — Other Ambulatory Visit (HOSPITAL_COMMUNITY): Payer: Self-pay

## 2023-06-08 NOTE — Telephone Encounter (Signed)
Mailing [employer] application to patients home

## 2023-06-11 ENCOUNTER — Ambulatory Visit: Payer: Medicare HMO | Admitting: Family Medicine

## 2023-06-11 DIAGNOSIS — E113411 Type 2 diabetes mellitus with severe nonproliferative diabetic retinopathy with macular edema, right eye: Secondary | ICD-10-CM | POA: Diagnosis not present

## 2023-06-12 NOTE — Progress Notes (Addendum)
Kristy [name] T. Kristy Kachmar, MD, CAQ Sports Medicine [facility] [address] [place], [address]  Phone: [phone]  FAX: [phone]  Kristy [name] - 72 y.o. female  MRN [id number]  Date of Birth: 28-Nov-1950  Date: 06/13/2023  PCP: Hannah Beat, MD  Referral: Hannah Beat, MD  Chief Complaint  Patient presents with   Hypertension   Diabetes   Hyperlipidemia   Subjective:   Kristy [name] is a 72 y.o. very pleasant female patient with Body mass index is 32.06 kg/m. who presents with the following:  Kristy [name] is here for long-term longitudinal follow-up.  Last time she was here in the office her blood pressure was elevated, and she did not want to change any of her blood pressure medications.  HTN: Tolerating all medications without side effects Stable and at goal No CP, no sob. No HA.  BP Readings from Last 3 Encounters:  06/13/23 130/64  04/23/23 (!) 152/80  01/18/23 (!) 157/71    Basic Metabolic Panel:    Component Value Date/Time   NA 139 12/06/2022 1524   NA 136 04/08/2020 1447   K 4.0 12/06/2022 1524   CL 102 12/06/2022 1524   CO2 30 12/06/2022 1524   BUN 11 12/06/2022 1524   BUN 16 04/08/2020 1447   CREATININE 0.88 12/06/2022 1524   CREATININE 0.72 07/27/2014 0902   GLUCOSE 232 (H) 12/06/2022 1524   CALCIUM 10.4 12/06/2022 1524    She has also had persistently elevated cholesterol over many years. She has not wanted to take any statins or any other cholesterol-lowering medications due to history of prior side effects.  Lipids: Panel reviewed with patient.  Lipids: Lab Results  Component Value Date   CHOL 239 (H) 12/06/2022   Lab Results  Component Value Date   HDL 41.20 12/06/2022   Lab Results  Component Value Date   LDLCALC 159 (H) 12/06/2022   Lab Results  Component Value Date   TRIG 191.0 (H) 12/06/2022   Lab Results  Component Value Date   CHOLHDL 6 12/06/2022    Lab Results   Component Value Date   ALT 37 (H) 12/06/2022   AST 43 (H) 12/06/2022   ALKPHOS 98 12/06/2022   BILITOT 0.5 12/06/2022    Severe type 2 diabetes with long-term very poor management of blood sugars and friable highly variable blood sugars.  She is now getting care from endocrinology at [facility].  She is very compliant with taking her insulin and diabetes medication.  She has been using a continuous glucose monitor, which has been helping her identify low and high blood sugars daily.  Her last A1c from 7 months ago was 9.9  She also does have chronic heart failure, and she is compliant with taking all of her heart failure medications.    Review of Systems is noted in the HPI, as appropriate  Objective:   BP 130/64 (BP Location: Right Arm, Patient Position: Sitting, Cuff Size: Large)   Pulse 84   Temp 98.3 F (36.8 C) (Temporal)   Ht 5' 2.5" (1.588 m)   Wt 178 lb 2 oz (80.8 kg)   SpO2 96%   BMI 32.06 kg/m   GEN: No acute distress; alert,appropriate. PULM: Breathing comfortably in no respiratory distress PSYCH: Normally interactive.  CV: RRR, no m/g/r  PULM: Normal respiratory rate, no accessory muscle use. No wheezes, crackles or rhonchi   Laboratory and Imaging Data: Results for orders placed or performed in  visit on 06/13/23  POCT glycosylated hemoglobin (Hb A1C)  Result Value Ref Range   Hemoglobin A1C 9.9 (A) 4.0 - 5.6 %   HbA1c POC (<> result, manual entry)     HbA1c, POC (prediabetic range)     HbA1c, POC (controlled diabetic range)       Assessment and Plan:     ICD-10-CM   1. Essential hypertension  I10     2. Chronic systolic CHF (congestive heart failure) (HCC)  I50.22     3. Hyperlipidemia associated with type 2 diabetes mellitus (HCC)  E11.69    E78.5     4. 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  Blood sugars are still an A1c almost at 10, but previously her A1c was persistently around 13 or 14.  She has a good rapport with her current endocrinologist.  She has been using a continuous glucose monitor with good effect monitoring her blood sugars for high and low blood sugars.  She is very compliant taking all of her diabetes medications.  Blood pressure is improved without any medication changes.  She continues to be in statin as well as any other kind of cholesterol medication intolerant including Repatha.  She does not want to go on any in the future.  Compliant with all heart medications.  Medication Management during today's office visit: No orders of the defined types were placed in this encounter.  Medications Discontinued During This Encounter  Medication Reason   ketoconazole (NIZORAL) 2 % cream Completed Course   hydrocortisone 2.5 % cream Completed Course   GVOKE HYPOPEN 2-PACK 1 MG/0.2ML SOAJ Cost of medication   insulin regular human CONCENTRATED (HUMULIN R) 500 UNIT/ML KwikPen Duplicate    Orders placed today for conditions managed today: Orders Placed This Encounter  Procedures   POCT glycosylated hemoglobin (Hb A1C)    Disposition: No follow-ups on file.  [employer] One speech-to-text software was used for transcription in this dictation.  Possible transcriptional errors can occur using Animal nutritionist.   Signed,  Elpidio Galea. Tiyona Desouza, MD   Outpatient Encounter Medications as of 06/13/2023  Medication Sig   aspirin EC 81 MG EC tablet Take 1 tablet (81 mg total) by mouth daily.   BD INSULIN SYRINGE U/F 31G X 5/16" 0.5 ML MISC USE AS DIRECTED   carvedilol (COREG) 25 MG tablet Take 1 tablet (25 mg total) by mouth 2 (two) times daily with a meal.   chlorhexidine (PERIDEX) 0.12 % solution as needed.   Continuous Glucose Receiver (FREESTYLE LIBRE 2 READER) DEVI by Does not  apply route.   Continuous Glucose Sensor (FREESTYLE LIBRE 2 SENSOR) MISC by Does not apply route.   Ensure (ENSURE) Take 237 mLs by mouth daily.   Insulin Pen Needle (PEN NEEDLES) 31G X 8 MM MISC Use to inject insulin two times a day   insulin regular human CONCENTRATED (HUMULIN R) 500 UNIT/ML injection Inject 65 Units into the skin 2 (two) times daily with a meal.   ONETOUCH ULTRA TEST test strip USE TO CHECK BLOOD SUGAR UP TO 8 TIMES DAILY   Probiotic Product (ALIGN PO) Take 1 tablet by mouth daily.   sacubitril-valsartan (ENTRESTO) 49-51 MG TAKE ONE (1) TABLET BY MOUTH TWO TIMES PER DAY   [DISCONTINUED] GVOKE HYPOPEN 2-PACK  1 MG/0.2ML SOAJ Inject into the skin. (Patient not taking: Reported on 04/23/2023)   [DISCONTINUED] hydrocortisone 2.5 % cream Apply topically daily. Tuesday, Thursday, Saturday (Patient not taking: Reported on 04/23/2023)   [DISCONTINUED] insulin regular human CONCENTRATED (HUMULIN R) 500 UNIT/ML KwikPen Inject 65 Units into the skin 2 (two) times daily with a meal.   [DISCONTINUED] ketoconazole (NIZORAL) 2 % cream Apply 1 Application topically daily. Monday, Wednesday, Friday (Patient not taking: Reported on 04/23/2023)   No facility-administered encounter medications on file as of 06/13/2023.

## 2023-06-13 ENCOUNTER — Ambulatory Visit (INDEPENDENT_AMBULATORY_CARE_PROVIDER_SITE_OTHER): Payer: Medicare HMO | Admitting: Family Medicine

## 2023-06-13 ENCOUNTER — Encounter: Payer: Self-pay | Admitting: Family Medicine

## 2023-06-13 VITALS — BP 130/64 | HR 84 | Temp 98.3°F | Ht 62.5 in | Wt 178.1 lb

## 2023-06-13 DIAGNOSIS — E1169 Type 2 diabetes mellitus with other specified complication: Secondary | ICD-10-CM

## 2023-06-13 DIAGNOSIS — Z794 Long term (current) use of insulin: Secondary | ICD-10-CM

## 2023-06-13 DIAGNOSIS — I5022 Chronic systolic (congestive) heart failure: Secondary | ICD-10-CM

## 2023-06-13 DIAGNOSIS — E785 Hyperlipidemia, unspecified: Secondary | ICD-10-CM

## 2023-06-13 DIAGNOSIS — I10 Essential (primary) hypertension: Secondary | ICD-10-CM | POA: Diagnosis not present

## 2023-06-13 LAB — POCT GLYCOSYLATED HEMOGLOBIN (HGB A1C): Hemoglobin A1C: 9.9 % — AB (ref 4.0–5.6)

## 2023-06-14 ENCOUNTER — Encounter: Payer: Self-pay | Admitting: Family Medicine

## 2023-06-25 NOTE — Progress Notes (Signed)
Cardiology Office Note    Date:  06/29/2023   ID:  Kristy [name], Kristy [name] [birth year], MRN [id number]  PCP:  Kristy Beat, MD  Cardiologist:  Kristy Nordmann, MD  Electrophysiologist:  None   Chief Complaint: Follow up  History of Present Illness:   Kristy [name] is a 72 y.o. female with history of nonobstructive CAD, HFimpEF secondary to NICM, uncontrolled diabetes with recurrent DKA, Crohn's disease, HTN, HLD, LBBB, prior tobacco use, gout, and GERD who presents for follow-up of CAD and cardiomyopathy.   She was initially diagnosed with cardiomyopathy in 2010 with an EF of 45% at that time.  Diagnostic cath showed minimal, nonobstructive CAD and she was medically managed.  Patient had recovery of LV systolic function later in 2010 with echo showing an EF of 50 to 55%.  Over the years, she has struggled with controlling her diabetes and has required recurrent admissions for DKA with AKI in the past requiring intermittent cessation of heart failure therapy.  In 08/2018, she developed worsening dyspnea with repeat echo showing an EF of 20 to 25%.  She underwent diagnostic cath which again showed predominantly nonobstructive CAD with more significant stenosis in a small diagonal branch.  Continued medical therapy was recommended.  Repeat echo in 12/2018 showed subsequent improvement in LV systolic function with an EF of 50 to 55%, moderate concentric LVH, diastolic dysfunction, normal RV systolic function, normal RV cavity size, mildly dilated left atrium, no significant valvular abnormality.  She was admitted in 04/2019 with altered mental status with difficulty with word finding preceded by a fall in the bathtub.  She was found on the floor by a family member.  She was noted to be febrile and in DKA upon her admission.  High-sensitivity troponin was elevated, peaking at 5667.  Echo on 04/21/2019 showed an EF of 35 to 40%, severe hypokinesis of the mid apical anterior wall and anteroseptal wall,  normal RV systolic function, normal RV cavity size, trileaflet aortic valve with mild aortic annular calcification, mild mitral annular calcification, normal size and structure aortic root.  Imaging of the head/brain was unrevealing for stroke.  Prior to discharge, the patient underwent diagnostic cath which showed stable appearance of coronary artery since 08/2018 without new lesion to explain recent decline in LVEF or elevated troponin.  It was felt the elevated troponin may be due to supply demand ischemia and or stress-induced cardiomyopathy in the setting of severe DKA.     Echo from 05/2020 demonstrated an EF of 55 to 60%, no regional wall motion abnormalities, grade 1 diastolic dysfunction, mildly reduced RV systolic function with normal ventricular cavity size, and trivial mitral regurgitation.   She was seen in the office in 01/2022 noting weight gain, dyspnea, and chest tightness.  She was concerned that these symptoms were related to her pharmacy utilizing a different manufacturer for her medication.  Subsequent echo on 03/10/2022 demonstrated an EF of 55%, no regional wall motion abnormalities, grade 1 diastolic dysfunction, normal RV systolic function and ventricular cavity size, no significant valvular abnormalities, and an estimated right atrial pressure of 3 mmHg.  She was seen in the office in 06/2022 and was without symptoms of angina or cardiac decompensation.  She noted her blood pressure was soft in the mornings.  She was not taking spironolactone secondary to Personal assistant concerns.  This medication was formerly discontinued given preserved LV systolic function and in the context of low blood pressures in the mornings.  She was last seen  in the office in 12/2022 and remained without symptoms of angina or cardiac decompensation.  Her weight was up 7 pounds when compared to revisit in 06/2022, which she attributed to caloric intake with elevated A1c.  Carvedilol was titrated to 25 mg  twice daily with mildly elevated blood pressure.  She comes in doing well from a cardiac perspective and is without symptoms of angina or cardiac decompensation.  No dyspnea, palpitations, dizziness, presyncope, or syncope.  No lower extremity swelling or orthopnea.  Her weight is down 4 pounds today when compared to her last clinic visit.  She is working on a heart healthy diet.  Prefers to minimize medications.  Overall, feels very well from a cardiac perspective and does not have any acute concerns at this time.   Labs independently reviewed: 06/2023 - A1c 9.9 12/2022 - potassium 4.0, BUN 11, serum creatinine 0.88, albumin 3.8, AST 43, ALT 37, TC 239, TG 191, HDL 41, LDL 159, Hgb 14.3, PLT 161 08/2019 - TSH normal  Past Medical History:  Diagnosis Date   Allergic rhinitis    Basal cell carcinoma 08/17/2014   Left nasal ala- Mohs at [facility]   Cataract    mild    Crohn's disease (HCC) 10/13/2013   Diabetic neuropathy associated with type 2 diabetes mellitus (HCC)    neuropathy   Diverticulosis    GERD (gastroesophageal reflux disease)    Gout    HFrEF (heart failure with reduced ejection fraction) (HCC)    a. 12/2008 Cath: EF 45% w/ inf HK; b. 07/2009 Echo: EF 50-55%; c. 08/2018 Echo: EF 20-25%.   Hyperlipidemia    Hypertension    IBS (irritable bowel syndrome)    Left bundle branch block    NICM (nonischemic cardiomyopathy) (HCC)    a. 12/2008 Cath: no significant dzs, EF 45% w/ inf HK->Med Rx; b. 07/2009 Echo: EF 50-55%; c. 08/2018 Echo: EF 20-25%, ant/antsept HK, mild MR, mildly dil LA, nl RV fx; d. 08/2018 Cath: D1 80, otw nonobs dzs->Med rx.   Non-obstructive CAD (coronary artery disease)    a. 12/2008 Cath: no significant dzs, EF 45% w/ inf HK->Med Rx; b. 08/2018 Cath: LM nl, LAD min irregs, D1 80, LCX 40m/d, RCA min irregs->Med Rx.   Osteoarthritis    Patient has active power of attorney for health care: Kristy [name] 10/04/2020   Symptomatic cholelithiasis    Uncontrolled type 2  diabetes mellitus with stage 3 chronic kidney disease, with long-term current use of insulin          Past Surgical History:  Procedure Laterality Date   BREAST BIOPSY Right 06/24/2019   stereo bx/ x clip/ neg   BREAST CYST ASPIRATION     CARDIAC CATHETERIZATION     CATARACT EXTRACTION W/PHACO Right 07/26/2021   Procedure: CATARACT EXTRACTION PHACO AND INTRAOCULAR LENS PLACEMENT (IOC) RIGHT DIABETIC;  Surgeon: Galen Manila, MD;  Location: [facility];  Service: Ophthalmology;  Laterality: Right;  Diabetic 8.99 01:10.2   COLONOSCOPY     DILATION AND CURETTAGE OF UTERUS     LEFT HEART CATH AND CORONARY ANGIOGRAPHY N/A 04/24/2019   Procedure: LEFT HEART CATH AND CORONARY ANGIOGRAPHY;  Surgeon: Yvonne Kendall, MD;  Location: [facility];  Service: Cardiovascular;  Laterality: N/A;   MOHS SURGERY     nose    RIGHT/LEFT HEART CATH AND CORONARY ANGIOGRAPHY N/A 08/26/2018   Procedure: RIGHT/LEFT HEART CATH AND CORONARY ANGIOGRAPHY;  Surgeon: Iran Ouch, MD;  Location: [facility] CV [facility];  Service: Cardiovascular;  Laterality: N/A;   SHOULDER SURGERY     15 + yrs ago    VAGINAL HYSTERECTOMY      Current Medications: Current Meds  Medication Sig   aspirin EC 81 MG EC tablet Take 1 tablet (81 mg total) by mouth daily.   BD INSULIN SYRINGE U/F 31G X 5/16" 0.5 ML MISC USE AS DIRECTED   carvedilol (COREG) 25 MG tablet Take 1 tablet (25 mg total) by mouth 2 (two) times daily with a meal.   chlorhexidine (PERIDEX) 0.12 % solution as needed.   Continuous Glucose Receiver (FREESTYLE LIBRE 2 READER) DEVI by Does not apply route.   Continuous Glucose Sensor (FREESTYLE LIBRE 2 SENSOR) MISC by Does not apply route.   Ensure (ENSURE) Take 237 mLs by mouth daily.   Insulin Pen Needle (PEN NEEDLES) 31G X 8 MM MISC Use to inject insulin two times a day   insulin regular human CONCENTRATED (HUMULIN R) 500 UNIT/ML injection Inject 70 Units into the skin 2 (two) times daily  with a meal.   ONETOUCH ULTRA TEST test strip USE TO CHECK BLOOD SUGAR UP TO 8 TIMES DAILY   Probiotic Product (ALIGN PO) Take 1 tablet by mouth daily.   sacubitril-valsartan (ENTRESTO) 49-51 MG TAKE ONE (1) TABLET BY MOUTH TWO TIMES PER DAY    Allergies:   Rosiglitazone, Fish oil, Glimepiride, Other, Repatha [evolocumab], Shellfish allergy, Guanfacine hcl, Metformin and related, Statins, Invokana [canagliflozin], Spironolactone, and Zetia [ezetimibe]   Social History   Socioeconomic History   Marital status: Widowed    Spouse name: Not on file   Number of children: Not on file   Years of education: Not on file   Highest education level: Not on file  Occupational History   Not on file  Tobacco Use   Smoking status: Former    Current packs/day: 0.00    Average packs/day: 0.8 packs/day for 30.0 years (22.5 ttl pk-yrs)    Types: Cigarettes    Start date: 06/07/1967    Quit date: 06/06/1997    Years since quitting: 26.0   Smokeless tobacco: Never  Vaping Use   Vaping status: Never Used  Substance and Sexual Activity   Alcohol use: No   Drug use: No   Sexual activity: Yes  Other Topics Concern   Not on file  Social History Narrative   Not on file   Social Determinants of Health   Financial Resource Strain: Low Risk  (02/15/2022)   Overall Financial Resource Strain (CARDIA)    Difficulty of Paying Living Expenses: Not very hard  Food Insecurity: No Food Insecurity (12/06/2022)   Hunger Vital Sign    Worried About Running Out of Food in the Last Year: Never true    Ran Out of Food in the Last Year: Never true  Transportation Needs: No Transportation Needs (12/06/2022)   PRAPARE - Administrator, Civil Service (Medical): No    Lack of Transportation (Non-Medical): No  Physical Activity: Not on file  Stress: Not on file  Social Connections: Moderately Isolated (12/06/2022)   Social Connection and Isolation Panel [NHANES]    Frequency of Communication with Friends and  Family: More than three times a week    Frequency of Social Gatherings with Friends and Family: More than three times a week    Attends Religious Services: More than 4 times per year    Active Member of [employer] or Organizations: No    Attends Banker Meetings:  Never    Marital Status: Widowed     Family History:  The patient's family history includes Hypertension in her father; Kidney failure in her brother and mother. There is no history of Colon cancer, Colon polyps, Rectal cancer, Stomach cancer, or Breast cancer.  ROS:   12-point review of systems is negative unless otherwise noted in the HPI.   EKGs/Labs/Other Studies Reviewed:    Studies reviewed were summarized above. The additional studies were reviewed today:  2D echo 03/10/2022: 1. Left ventricular ejection fraction, by estimation, is 55%. The left  ventricle has normal function. The left ventricle has no regional wall  motion abnormalities. Left ventricular diastolic parameters are consistent  with Grade I diastolic dysfunction  (impaired relaxation). The average left ventricular global longitudinal  strain is -10.9 %.   2. Right ventricular systolic function is normal. The right ventricular  size is normal.   3. The mitral valve is normal in structure. No evidence of mitral valve  regurgitation. No evidence of mitral stenosis.   4. The aortic valve is normal in structure. Aortic valve regurgitation is  not visualized. No aortic stenosis is present.   5. The inferior vena cava is normal in size with greater than 50%  respiratory variability, suggesting right atrial pressure of 3 mmHg. __________   2D echo 05/11/2020: 1. Left ventricular ejection fraction, by estimation, is 55 to 60%. The  left ventricle has normal function. The left ventricle has no regional  wall motion abnormalities. Left ventricular diastolic parameters are  consistent with Grade I diastolic  dysfunction (impaired relaxation). Elevated  left atrial pressure.   2. Right ventricular systolic function is mildly reduced. The right  ventricular size is normal. Tricuspid regurgitation signal is inadequate  for assessing PA pressure.   3. The mitral valve was not well visualized. Trivial mitral valve  regurgitation. No evidence of mitral stenosis.   4. The aortic valve is tricuspid. Aortic valve regurgitation is not  visualized. No aortic stenosis is present. __________   [facility] 04/24/2019: Conclusions: Stable appearance of coronary arteries since 08/2018 without new lesion to explain recent decline in LVEF or troponin elevation.  I suspect elevated troponin may be due to supply-demand mismatch and/or stress-induced cardiomyopathy in the setting of severe DKA. Focal D1 stenosis remains ~80%.  There is mild to moderate, non-obstructive disease involving the LAD and LCx. Mildly elevated left ventricular filling pressure.   Recommendations: Continue optimization of evidence-based heart failure.  Will continue carvedilol 3.125 mg BID and restart Entresto today. Maintain net even fluid balance; could consider started gentle diuresis tomorrow as renal function allows. Dual antiplatelet therapy with aspirin and clopidogrel for 12 months. Aggressive secondary prevention. __________   2D echo 04/21/2019: 1. Severe hypokinesis of the left ventricular, mid-apical anterior wall  and anteroseptal wall.   2. The left ventricle has moderately reduced systolic function, with an  ejection fraction of 35-40%. The cavity size was normal. Left ventricular  diastolic function could not be evaluated.   3. The right ventricle has normal systolic function. The cavity was  normal. There is mildly increased right ventricular wall thickness.   4. Left atrial size was not well visualized.   5. The aortic valve is tricuspid. Mild thickening of the aortic valve.  Aortic valve regurgitation was not assessed by color flow Doppler. Mild  aortic annular  calcification noted.   6. The mitral valve was not well visualized. There is mild mitral annular  calcification present.   7. The aortic  root is normal in size and structure.   8. The interatrial septum was not well visualized. ___________   2D echo 12/10/2018: 1. The left ventricle has low normal systolic function, with an ejection  fraction of 50-55%. The cavity size was normal. There is moderate  concentric left ventricular hypertrophy. Left ventricular diastolic  Doppler parameters are consistent with  impaired relaxation.   2. The right ventricle has normal systolic function. The cavity was  normal. There is no increase in right ventricular wall thickness.   3. Left atrial size was mildly dilated.   4. The mitral valve is grossly normal.   5. The tricuspid valve is grossly normal.   6. The aortic valve is grossly normal.   7. Significant improvement in EF since last echo. __________   [facility] 08/26/2018: Ost 1st Diag to 1st Diag lesion is 80% stenosed. Mid Cx to Dist Cx lesion is 40% stenosed.   1.  Significant one-vessel coronary artery disease involving first diagonal with mild to moderate disease in the main vessels.   2.  Left ventricular angiography was not performed.  EF was severely reduced by echo. 3.  Right heart catheterization showed normal filling pressures, normal pulmonary pressure and mildly reduced cardiac output at 3.91 with a cardiac index of 2.23.     Recommendations: The patient has nonischemic cardiomyopathy.  Recommend up titration of heart failure medications.  I am going to ask her to increase carvedilol to 6.25 mg twice daily given that she has been taking 3.25 mg twice daily.  Recommend switching lisinopril to Entresto and adding spironolactone.  If ejection fraction remains low after 3 months of optimal medical therapy, consider CRT ICD placement given underlying left bundle branch block. __________   2D echo 08/15/2018: - Left ventricle: The cavity  size was mildly dilated. Systolic    function was severely reduced. The estimated ejection fraction    was in the range of 20% to 25%. Hypokinesis of the anterior    myocardium. Hypokinesis of the anteroseptal myocardium. The study    is not technically sufficient to allow evaluation of LV diastolic    function.  - Mitral valve: There was mild regurgitation.  - Left atrium: The atrium was mildly dilated.  - Right ventricle: Systolic function was normal.  - Pulmonary arteries: Systolic pressure was within the normal    range.   EKG:  EKG is not ordered today.    Recent Labs: 12/06/2022: ALT 37; BUN 11; Creatinine, Ser 0.88; Hemoglobin 14.3; Platelets 161.0; Potassium 4.0; Sodium 139  Recent Lipid Panel    Component Value Date/Time   CHOL 239 (H) 12/06/2022 1524   TRIG 191.0 (H) 12/06/2022 1524   HDL 41.20 12/06/2022 1524   CHOLHDL 6 12/06/2022 1524   VLDL 38.2 12/06/2022 1524   LDLCALC 159 (H) 12/06/2022 1524   LDLDIRECT 187.0 12/05/2021 1035    PHYSICAL EXAM:    VS:  BP (!) 144/80 (BP Location: Left Arm, Patient Position: Sitting, Cuff Size: Normal)   Pulse 83   Ht 5\' 2"  (1.575 m)   Wt 174 lb (78.9 kg)   SpO2 96%   BMI 31.83 kg/m   BMI: Body mass index is 31.83 kg/m.  Physical Exam Vitals reviewed.  Constitutional:      Appearance: She is well-developed.  HENT:     Head: Normocephalic and atraumatic.  Eyes:     General:        Right eye: No discharge.  Left eye: No discharge.  Neck:     Vascular: No JVD.  Cardiovascular:     Rate and Rhythm: Normal rate and regular rhythm.     Heart sounds: Normal heart sounds, S1 normal and S2 normal. Heart sounds not distant. No midsystolic click and no opening snap. No murmur heard.    No friction rub.  Pulmonary:     Effort: Pulmonary effort is normal. No respiratory distress.     Breath sounds: Normal breath sounds. No decreased breath sounds, wheezing or rales.  Chest:     Chest wall: No tenderness.  Abdominal:      General: There is no distension.  Musculoskeletal:     Cervical back: Normal range of motion.  Skin:    General: Skin is warm and dry.     Nails: There is no clubbing.  Neurological:     Mental Status: She is alert and oriented to person, place, and time.  Psychiatric:        Speech: Speech normal.        Behavior: Behavior normal.        Thought Content: Thought content normal.        Judgment: Judgment normal.     Wt Readings from Last 3 Encounters:  06/29/23 174 lb (78.9 kg)  06/13/23 178 lb 2 oz (80.8 kg)  12/25/22 176 lb (79.8 kg)     ASSESSMENT & PLAN:   Nonobstructive CAD: No symptoms suggestive of angina.  LHC from 04/2019 showed stable nonobstructive CAD as outlined above.  She remains on carvedilol and Entresto along with aspirin.  No indication for further ischemic testing at this time.  HFimpEF: Euvolemic and well compensated.  She remains on Entresto and carvedilol.  No longer on spironolactone secondary to pharmaceutical manufacturer concerns and history of low blood pressure.  Not requiring a standing loop diuretic.  Should she have recurrence of cardiomyopathy, GDMT would need to be revisited.  HLD: Intolerant to statins, ezetimibe, and PCSK9 inhibitors.  LDL 159 in 12/2022.  Not presented pursuing alternative lipid-lowering therapies.  Heart healthy diet recommended.  HTN: Blood pressure is mildly elevated in the office today though well-controlled at home and has been well-controlled at outside offices.  She remains on carvedilol and Entresto.  Recent labs stable.   Disposition: F/u with Dr. Mariah Milling or an APP in 6 months.   Medication Adjustments/Labs and Tests Ordered: Current medicines are reviewed at length with the patient today.  Concerns regarding medicines are outlined above. Medication changes, Labs and Tests ordered today are summarized above and listed in the Patient Instructions accessible in Encounters.   Signed, Eula Listen, PA-C 06/29/2023 3:40  PM     [facility] [place] [address] [place], [place] [address] [phone]

## 2023-06-28 ENCOUNTER — Ambulatory Visit: Payer: Medicare HMO | Admitting: Physician Assistant

## 2023-06-29 ENCOUNTER — Encounter: Payer: Self-pay | Admitting: Physician Assistant

## 2023-06-29 ENCOUNTER — Ambulatory Visit: Payer: Medicare HMO | Attending: Physician Assistant | Admitting: Physician Assistant

## 2023-06-29 VITALS — BP 144/80 | HR 83 | Ht 62.0 in | Wt 174.0 lb

## 2023-06-29 DIAGNOSIS — E785 Hyperlipidemia, unspecified: Secondary | ICD-10-CM

## 2023-06-29 DIAGNOSIS — I10 Essential (primary) hypertension: Secondary | ICD-10-CM

## 2023-06-29 DIAGNOSIS — I2510 Atherosclerotic heart disease of native coronary artery without angina pectoris: Secondary | ICD-10-CM

## 2023-06-29 DIAGNOSIS — I428 Other cardiomyopathies: Secondary | ICD-10-CM

## 2023-06-29 DIAGNOSIS — Z789 Other specified health status: Secondary | ICD-10-CM | POA: Diagnosis not present

## 2023-06-29 DIAGNOSIS — I5022 Chronic systolic (congestive) heart failure: Secondary | ICD-10-CM | POA: Diagnosis not present

## 2023-06-29 NOTE — Patient Instructions (Signed)
Medication Instructions:  Your Physician recommend you continue on your current medication as directed.    *If you need a refill on your cardiac medications before your next appointment, please call your pharmacy*  Follow-Up: At [facility], you and your health needs are our priority.  As part of our continuing mission to provide you with exceptional heart care, we have created designated Provider Care Teams.  These Care Teams include your primary Cardiologist (physician) and Advanced Practice Providers (APPs -  Physician Assistants and Nurse Practitioners) who all work together to provide you with the care you need, when you need it.  We recommend signing up for the patient portal called "MyChart".  Sign up information is provided on this After Visit Summary.  MyChart is used to connect with patients for Virtual Visits (Telemedicine).  Patients are able to view lab/test results, encounter notes, upcoming appointments, etc.  Non-urgent messages can be sent to your provider as well.   To learn more about what you can do with MyChart, go to [email or web address].    Your next appointment:   6 month(s)  Provider:   You may see Julien Nordmann, MD or one of the following Advanced Practice Providers on your designated Care Team:   Eula Listen, [place]

## 2023-07-04 DIAGNOSIS — E113412 Type 2 diabetes mellitus with severe nonproliferative diabetic retinopathy with macular edema, left eye: Secondary | ICD-10-CM | POA: Diagnosis not present

## 2023-07-13 DIAGNOSIS — E785 Hyperlipidemia, unspecified: Secondary | ICD-10-CM | POA: Diagnosis not present

## 2023-07-13 DIAGNOSIS — Z794 Long term (current) use of insulin: Secondary | ICD-10-CM | POA: Diagnosis not present

## 2023-07-13 DIAGNOSIS — E1129 Type 2 diabetes mellitus with other diabetic kidney complication: Secondary | ICD-10-CM | POA: Diagnosis not present

## 2023-07-13 DIAGNOSIS — E1165 Type 2 diabetes mellitus with hyperglycemia: Secondary | ICD-10-CM | POA: Diagnosis not present

## 2023-07-13 DIAGNOSIS — Z8673 Personal history of transient ischemic attack (TIA), and cerebral infarction without residual deficits: Secondary | ICD-10-CM | POA: Diagnosis not present

## 2023-07-13 DIAGNOSIS — R809 Proteinuria, unspecified: Secondary | ICD-10-CM | POA: Diagnosis not present

## 2023-07-19 ENCOUNTER — Other Ambulatory Visit: Payer: Self-pay | Admitting: Physician Assistant

## 2023-07-23 DIAGNOSIS — E113411 Type 2 diabetes mellitus with severe nonproliferative diabetic retinopathy with macular edema, right eye: Secondary | ICD-10-CM | POA: Diagnosis not present

## 2023-07-26 ENCOUNTER — Ambulatory Visit: Payer: Medicare HMO | Admitting: Podiatry

## 2023-07-26 ENCOUNTER — Encounter: Payer: Self-pay | Admitting: Podiatry

## 2023-07-26 DIAGNOSIS — M79674 Pain in right toe(s): Secondary | ICD-10-CM

## 2023-07-26 DIAGNOSIS — E0865 Diabetes mellitus due to underlying condition with hyperglycemia: Secondary | ICD-10-CM | POA: Diagnosis not present

## 2023-07-26 DIAGNOSIS — B351 Tinea unguium: Secondary | ICD-10-CM

## 2023-07-26 DIAGNOSIS — L84 Corns and callosities: Secondary | ICD-10-CM | POA: Diagnosis not present

## 2023-07-26 DIAGNOSIS — M79675 Pain in left toe(s): Secondary | ICD-10-CM

## 2023-08-02 NOTE — Progress Notes (Signed)
Subjective:  Patient ID: Kristy [name], female    DOB: 1951/06/26,  MRN: [id number]  72 y.o. female presents to clinic with  preventative diabetic foot care and painful thick toenails that are difficult to trim. Pain interferes with ambulation. Aggravating factors include wearing enclosed shoe gear. Pain is relieved with periodic professional debridement.    New problem(s): None   PCP is Copland, Spencer, MD.  Allergies  Allergen Reactions   Rosiglitazone Other (See Comments)    CHF   Fish Oil Other (See Comments)    Gout   Glimepiride Other (See Comments)    REACTION: hypoglycemia   Other Other (See Comments)    Splenda caused sugars levels to increase   Repatha [Evolocumab] Other (See Comments)    "Felt awful for 2 weeks"   Shellfish Allergy Other (See Comments)    Gout   Guanfacine Hcl Other (See Comments)    REACTION: unspecified   Metformin And Related    Statins Other (See Comments)    Myalgias - atorvastatin 80, rosuvastatin 5-10, pravastatin 10   Invokana [Canagliflozin] Other (See Comments)    Increased thirst   Spironolactone Other (See Comments)    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   Objective:  Kristy [name] is a pleasant 72 y.o. female WD, WN in NAD. AAO x 3.  Vascular Examination: Vascular status intact b/l with palpable pedal pulses. CFT immediate b/l. No edema. No pain with calf compression b/l. Skin temperature gradient WNL b/l. Pedal hair present.  Neurological Examination: Sensation grossly intact b/l with 10 gram monofilament. Vibratory sensation intact b/l.   Dermatological Examination: Pedal skin with normal turgor, texture and tone b/l. Toenails 1-5 b/l thick, discolored, elongated with subungual debris and pain on dorsal palpation.Hyperkeratotic lesion(s) bilateral great toes, submet head 1  left foot, and submet head 1 right foot.  No erythema, no edema, no drainage, no fluctuance.  Musculoskeletal Examination: Muscle strength 5/5 to b/l LE. No pain, crepitus or joint limitation noted with ROM bilateral LE. No gross bony deformities bilaterally.  Radiographs: None  Last A1c:      Latest Ref Rng & Units 06/13/2023    2:53 PM  Hemoglobin A1C  Hemoglobin-A1c 4.0 - 5.6 % 9.9      Assessment:   1. Pain due to onychomycosis of toenails of both feet   2. Callus   3. Diabetes mellitus due to underlying condition, uncontrolled, with hyperglycemia (HCC)    Plan:  -Consent given for treatment as described below: -Examined patient. -No new findings. No new orders. -Mycotic toenails 1-5 bilaterally were debrided in length and girth with sterile nail nippers and dremel without incident. -Callus(es) bilateral great toes and submet head 1 b/l pared utilizing sterile scalpel blade without complication or incident. Total number debrided =4. -Patient/POA to call should there be question/concern in the interim.  Return in about 3 months (around 10/26/2023).  Freddie Breech, DPM

## 2023-08-03 DIAGNOSIS — E113412 Type 2 diabetes mellitus with severe nonproliferative diabetic retinopathy with macular edema, left eye: Secondary | ICD-10-CM | POA: Diagnosis not present

## 2023-08-23 ENCOUNTER — Ambulatory Visit: Payer: Medicare HMO | Admitting: Dermatology

## 2023-08-23 DIAGNOSIS — L578 Other skin changes due to chronic exposure to nonionizing radiation: Secondary | ICD-10-CM | POA: Diagnosis not present

## 2023-08-23 DIAGNOSIS — L814 Other melanin hyperpigmentation: Secondary | ICD-10-CM | POA: Diagnosis not present

## 2023-08-23 DIAGNOSIS — Z872 Personal history of diseases of the skin and subcutaneous tissue: Secondary | ICD-10-CM

## 2023-08-23 DIAGNOSIS — Z85828 Personal history of other malignant neoplasm of skin: Secondary | ICD-10-CM

## 2023-08-23 DIAGNOSIS — L570 Actinic keratosis: Secondary | ICD-10-CM | POA: Diagnosis not present

## 2023-08-23 DIAGNOSIS — L821 Other seborrheic keratosis: Secondary | ICD-10-CM

## 2023-08-23 DIAGNOSIS — W908XXA Exposure to other nonionizing radiation, initial encounter: Secondary | ICD-10-CM

## 2023-08-23 DIAGNOSIS — L820 Inflamed seborrheic keratosis: Secondary | ICD-10-CM | POA: Diagnosis not present

## 2023-08-23 NOTE — Patient Instructions (Signed)

## 2023-08-23 NOTE — Progress Notes (Signed)
Follow-Up Visit   Subjective  Kristy [name] is a 72 y.o. female who presents for the following: Growths on the face, irritating.   The patient has spots, moles and lesions to be evaluated, some may be new or changing. History of Aks. History of BCC of the left nasal ala.   The following portions of the chart were reviewed this encounter and updated as appropriate: medications, allergies, medical history  Review of Systems:  No other skin or systemic complaints except as noted in HPI or Assessment and Plan.  Objective  Well appearing patient in no apparent distress; mood and affect are within normal limits.  A focused examination was performed of the following areas: Face, neck  Relevant physical exam findings are noted in the Assessment and Plan.  R nose supratip x 1 Erythematous thin papules/macules with gritty scale.   face x 21 (21) Erythematous stuck-on, waxy papule or plaque    Assessment & Plan   AK (actinic keratosis) R nose supratip x 1  Actinic keratoses are precancerous spots that appear secondary to cumulative UV radiation exposure/sun exposure over time. They are chronic with expected duration over 1 year. A portion of actinic keratoses will progress to squamous cell carcinoma of the skin. It is not possible to reliably predict which spots will progress to skin cancer and so treatment is recommended to prevent development of skin cancer.  Recommend daily broad spectrum sunscreen SPF 30+ to sun-exposed areas, reapply every 2 hours as needed.  Recommend staying in the shade or wearing long sleeves, sun glasses (UVA+UVB protection) and wide brim hats (4-inch brim around the entire circumference of the hat). Call for new or changing lesions.  Destruction of lesion - R nose supratip x 1 Complexity: simple   Destruction method: cryotherapy   Informed consent: discussed and consent obtained   Timeout:  patient name, date of birth, surgical site, and procedure  verified Lesion destroyed using liquid nitrogen: Yes   Region frozen until ice ball extended beyond lesion: Yes   Outcome: patient tolerated procedure well with no complications   Post-procedure details: wound care instructions given    Inflamed seborrheic keratosis (21) face x 21  Symptomatic, irritating, patient would like treated.  Destruction of lesion - face x 21 (21) Complexity: simple   Destruction method: cryotherapy   Informed consent: discussed and consent obtained   Timeout:  patient name, date of birth, surgical site, and procedure verified Lesion destroyed using liquid nitrogen: Yes   Region frozen until ice ball extended beyond lesion: Yes   Outcome: patient tolerated procedure well with no complications   Post-procedure details: wound care instructions given    ACTINIC DAMAGE - chronic, secondary to cumulative UV radiation exposure/sun exposure over time - diffuse scaly erythematous macules with underlying dyspigmentation - Recommend daily broad spectrum sunscreen SPF 30+ to sun-exposed areas, reapply every 2 hours as needed.  - Recommend staying in the shade or wearing long sleeves, sun glasses (UVA+UVB protection) and wide brim hats (4-inch brim around the entire circumference of the hat). - Call for new or changing lesions.  HISTORY OF BASAL CELL CARCINOMA OF THE SKIN Left nasal ala, Mohs 08/17/2014 - No evidence of recurrence today - Recommend regular full body skin exams - Recommend daily broad spectrum sunscreen SPF 30+ to sun-exposed areas, reapply every 2 hours as needed.  - Call if any new or changing lesions are noted between office visits  LENTIGINES Exam: scattered tan macules Due to sun exposure Treatment  Plan: Benign-appearing, observe. Recommend daily broad spectrum sunscreen SPF 30+ to sun-exposed areas, reapply every 2 hours as needed.  Call for any changes  SEBORRHEIC KERATOSIS - Stuck-on, waxy, tan-brown papules and/or plaques  -  Benign-appearing - Discussed benign etiology and prognosis. - Observe - Call for any changes   Return ISKs prn.  Wendee Beavers, CMA, am acting as scribe for Armida Sans, MD .   Documentation: I have reviewed the above documentation for accuracy and completeness, and I agree with the above.  Armida Sans, MD

## 2023-08-27 DIAGNOSIS — E113411 Type 2 diabetes mellitus with severe nonproliferative diabetic retinopathy with macular edema, right eye: Secondary | ICD-10-CM | POA: Diagnosis not present

## 2023-08-28 ENCOUNTER — Encounter: Payer: Self-pay | Admitting: Dermatology

## 2023-08-29 DIAGNOSIS — E1165 Type 2 diabetes mellitus with hyperglycemia: Secondary | ICD-10-CM | POA: Diagnosis not present

## 2023-08-29 DIAGNOSIS — Z794 Long term (current) use of insulin: Secondary | ICD-10-CM | POA: Diagnosis not present

## 2023-09-03 DIAGNOSIS — E113412 Type 2 diabetes mellitus with severe nonproliferative diabetic retinopathy with macular edema, left eye: Secondary | ICD-10-CM | POA: Diagnosis not present

## 2023-09-03 DIAGNOSIS — E113411 Type 2 diabetes mellitus with severe nonproliferative diabetic retinopathy with macular edema, right eye: Secondary | ICD-10-CM | POA: Diagnosis not present

## 2023-09-03 DIAGNOSIS — E113493 Type 2 diabetes mellitus with severe nonproliferative diabetic retinopathy without macular edema, bilateral: Secondary | ICD-10-CM | POA: Diagnosis not present

## 2023-09-10 ENCOUNTER — Other Ambulatory Visit: Payer: Self-pay | Admitting: Cardiovascular Disease

## 2023-09-28 DIAGNOSIS — Z794 Long term (current) use of insulin: Secondary | ICD-10-CM | POA: Diagnosis not present

## 2023-09-28 DIAGNOSIS — E1165 Type 2 diabetes mellitus with hyperglycemia: Secondary | ICD-10-CM | POA: Diagnosis not present

## 2023-10-02 DIAGNOSIS — E113411 Type 2 diabetes mellitus with severe nonproliferative diabetic retinopathy with macular edema, right eye: Secondary | ICD-10-CM | POA: Diagnosis not present

## 2023-10-05 DIAGNOSIS — E113412 Type 2 diabetes mellitus with severe nonproliferative diabetic retinopathy with macular edema, left eye: Secondary | ICD-10-CM | POA: Diagnosis not present

## 2023-10-05 LAB — HM DIABETES EYE EXAM

## 2023-10-26 ENCOUNTER — Ambulatory Visit: Payer: Self-pay | Admitting: Family Medicine

## 2023-10-26 NOTE — Telephone Encounter (Signed)
  Chief Complaint: possible hemorrhoid Symptoms: painless puffy red tissue at rectum Frequency: x 5 days Pertinent Negatives: Patient denies rectal bleeding, rectal itching, fever, nausea, vomiting, abdominal pain Disposition: [] ED /[] Urgent Care (no appt availability in office) / [x] Appointment(In office/virtual)/ []  [facility]/ [] Home Care/ [] Refused Recommended Disposition /[] [facility]/ []  Follow-up with PCP Additional Notes: Patient verbalizes understanding to call back triage if any new or worsening symptoms.   Reason for Disposition  Painless lump in rectal area  Answer Assessment - Initial Assessment Questions 1. SYMPTOM:  "What's the main symptom you're concerned about?" (e.g., pain, itching, swelling, rash)     Patient thinks she has a hemorrhoid due to she feels a lump at her rectum (about the size of a pea) and looks like puffy red tissue.  2. ONSET: "When did you notice the tissue?"     Monday.  3. RECTAL PAIN: "Do you have any pain around your rectum?" "How bad is the pain?"  (Scale 0-10; or mild, moderate, severe)   - NONE (0): no pain   - MILD (1-3): doesn't interfere with normal activities    - MODERATE (4-7): interferes with normal activities or awakens from sleep, limping    - SEVERE (8-10): excruciating pain, unable to have a bowel movement      Denies.  4. RECTAL ITCHING: "Do you have any itching in this area?" "How bad is the itching?"  (Scale 0-10; or mild, moderate, severe)   - NONE: no itching   - MILD: doesn't interfere with normal activities    - MODERATE-SEVERE: interferes with normal activities or awakens from sleep     Denies.  5. CONSTIPATION: "Do you have constipation?" If Yes, ask: "How bad is it?"     Denies. Patient states she has irritable bowel and has diarrhea.  6. CAUSE: "What do you think is causing the anus symptoms?"     Patient thinks it could either be a hemorrhoid or a polyp she had in the past near her  rectum.  7. OTHER SYMPTOMS: "Do you have any other symptoms?"  (e.g., abdomen pain, fever, rectal bleeding, vomiting)     Denies.  Protocols used: Rectal Symptoms-A-AH

## 2023-10-29 ENCOUNTER — Ambulatory Visit: Payer: Medicare HMO | Admitting: Podiatry

## 2023-10-29 ENCOUNTER — Encounter: Payer: Self-pay | Admitting: Podiatry

## 2023-10-29 VITALS — Ht 62.0 in | Wt 174.0 lb

## 2023-10-29 DIAGNOSIS — M79675 Pain in left toe(s): Secondary | ICD-10-CM | POA: Diagnosis not present

## 2023-10-29 DIAGNOSIS — E0865 Diabetes mellitus due to underlying condition with hyperglycemia: Secondary | ICD-10-CM | POA: Diagnosis not present

## 2023-10-29 DIAGNOSIS — M2041 Other hammer toe(s) (acquired), right foot: Secondary | ICD-10-CM | POA: Diagnosis not present

## 2023-10-29 DIAGNOSIS — B351 Tinea unguium: Secondary | ICD-10-CM | POA: Diagnosis not present

## 2023-10-29 DIAGNOSIS — M2042 Other hammer toe(s) (acquired), left foot: Secondary | ICD-10-CM | POA: Diagnosis not present

## 2023-10-29 DIAGNOSIS — E119 Type 2 diabetes mellitus without complications: Secondary | ICD-10-CM

## 2023-10-29 DIAGNOSIS — L84 Corns and callosities: Secondary | ICD-10-CM

## 2023-10-29 DIAGNOSIS — M79674 Pain in right toe(s): Secondary | ICD-10-CM | POA: Diagnosis not present

## 2023-10-29 NOTE — Progress Notes (Signed)
ANNUAL DIABETIC FOOT EXAM  Subjective: Kristy [name] presents today for annual diabetic foot exam.  Patient confirms h/o diabetes.  Patient denies any h/o foot wounds.  Kristy Beat, MD is patient's PCP.  Past Medical History:  Diagnosis Date   Allergic rhinitis    Basal cell carcinoma 08/17/2014   Left nasal ala- Mohs at [facility]   Cataract    mild    Crohn's disease (HCC) 10/13/2013   Diabetic neuropathy associated with type 2 diabetes mellitus (HCC)    neuropathy   Diverticulosis    GERD (gastroesophageal reflux disease)    Gout    HFrEF (heart failure with reduced ejection fraction) (HCC)    a. 12/2008 Cath: EF 45% w/ inf HK; b. 07/2009 Echo: EF 50-55%; c. 08/2018 Echo: EF 20-25%.   Hyperlipidemia    Hypertension    IBS (irritable bowel syndrome)    Left bundle branch block    NICM (nonischemic cardiomyopathy) (HCC)    a. 12/2008 Cath: no significant dzs, EF 45% w/ inf HK->Med Rx; b. 07/2009 Echo: EF 50-55%; c. 08/2018 Echo: EF 20-25%, ant/antsept HK, mild MR, mildly dil LA, nl RV fx; d. 08/2018 Cath: D1 80, otw nonobs dzs->Med rx.   Non-obstructive CAD (coronary artery disease)    a. 12/2008 Cath: no significant dzs, EF 45% w/ inf HK->Med Rx; b. 08/2018 Cath: LM nl, LAD min irregs, D1 80, LCX 58m/d, RCA min irregs->Med Rx.   Osteoarthritis    Patient has active power of attorney for health care: Kristy [name] 10/04/2020   Symptomatic cholelithiasis    Uncontrolled type 2 diabetes mellitus with stage 3 chronic kidney disease, with long-term current use of insulin         Patient Active Problem List   Diagnosis Date Noted   Statin intolerance 12/06/2021   Advanced care planning/counseling discussion: DNR / DNI 10/04/2020   Patient has active power of attorney for health care: Kristy [name] 10/04/2020   CKD stage 2 due to type 2 diabetes mellitus (HCC) 10/04/2020   NICM (nonischemic cardiomyopathy) (HCC) 11/08/2018   Chronic systolic CHF (congestive heart failure)  (HCC) 08/22/2018   Coronary artery disease, non-occlusive 09/13/2016   Crohn's disease (HCC) 10/13/2013   Major depressive disorder, recurrent, in full remission (HCC) 05/01/2011   PSORIASIS 08/02/2009   Hyperlipidemia associated with type 2 diabetes mellitus (HCC) 05/11/2009   GOUT 02/05/2009   Uncontrolled type 2 diabetes mellitus with stage 3 chronic kidney disease, with long-term current use of insulin    Essential hypertension 04/18/2007   LBBB (left bundle branch block) 04/18/2007   ALLERGIC RHINITIS 04/18/2007   Past Surgical History:  Procedure Laterality Date   BREAST BIOPSY Right 06/24/2019   stereo bx/ x clip/ neg   BREAST CYST ASPIRATION     CARDIAC CATHETERIZATION     CATARACT EXTRACTION W/PHACO Right 07/26/2021   Procedure: CATARACT EXTRACTION PHACO AND INTRAOCULAR LENS PLACEMENT (IOC) RIGHT DIABETIC;  Surgeon: Galen Manila, MD;  Location: [facility];  Service: Ophthalmology;  Laterality: Right;  Diabetic 8.99 01:10.2   COLONOSCOPY     DILATION AND CURETTAGE OF UTERUS     LEFT HEART CATH AND CORONARY ANGIOGRAPHY N/A 04/24/2019   Procedure: LEFT HEART CATH AND CORONARY ANGIOGRAPHY;  Surgeon: Yvonne Kendall, MD;  Location: [facility];  Service: Cardiovascular;  Laterality: N/A;   MOHS SURGERY     nose    RIGHT/LEFT HEART CATH AND CORONARY ANGIOGRAPHY N/A 08/26/2018   Procedure: RIGHT/LEFT HEART CATH AND CORONARY  ANGIOGRAPHY;  Surgeon: Iran Ouch, MD;  Location: [facility];  Service: Cardiovascular;  Laterality: N/A;   SHOULDER SURGERY     15 + yrs ago    VAGINAL HYSTERECTOMY     Current Outpatient Medications on File Prior to Visit  Medication Sig Dispense Refill   aspirin EC 81 MG EC tablet Take 1 tablet (81 mg total) by mouth daily.     BD INSULIN SYRINGE U/F 31G X 5/16" 0.5 ML MISC USE AS DIRECTED 100 each 11   carvedilol (COREG) 25 MG tablet TAKE ONE TABLET BY MOUTH TWICE DAILY WITH A MEAL 180 tablet 0   chlorhexidine  (PERIDEX) 0.12 % solution as needed.     Continuous Glucose Receiver (FREESTYLE LIBRE 2 READER) DEVI by Does not apply route.     Ensure (ENSURE) Take 237 mLs by mouth daily.     Insulin Pen Needle (PEN NEEDLES) 31G X 8 MM MISC Use to inject insulin two times a day 200 each 3   insulin regular human CONCENTRATED (HUMULIN R) 500 UNIT/ML injection Inject 70 Units into the skin 2 (two) times daily with a meal.     ONETOUCH ULTRA TEST test strip USE TO CHECK BLOOD SUGAR UP TO 8 TIMES DAILY 250 each 3   sacubitril-valsartan (ENTRESTO) 49-51 MG TAKE ONE (1) TABLET BY MOUTH TWO TIMES PER DAY 60 tablet 3   No current facility-administered medications on file prior to visit.    Allergies  Allergen Reactions   Rosiglitazone Other (See Comments)    CHF   Fish Oil Other (See Comments)    Gout   Glimepiride Other (See Comments)    REACTION: hypoglycemia   Other Other (See Comments)    Splenda caused sugars levels to increase   Repatha [Evolocumab] Other (See Comments)    "Felt awful for 2 weeks"   Shellfish Allergy Other (See Comments)    Gout   Guanfacine Hcl Other (See Comments)    REACTION: unspecified   Metformin And Related    Statins Other (See Comments)    Myalgias - atorvastatin 80, rosuvastatin 5-10, pravastatin 10   Invokana [Canagliflozin] Other (See Comments)    Increased thirst   Spironolactone Other (See Comments)    Patient concerns re: Pharmacologist and hx of low BP   Zetia [Ezetimibe] Other (See Comments)    unspecified   Social History   Occupational History   Not on file  Tobacco Use   Smoking status: Former    Current packs/day: 0.00    Average packs/day: 0.8 packs/day for 30.0 years (22.5 ttl pk-yrs)    Types: Cigarettes    Start date: 06/07/1967    Quit date: 06/06/1997    Years since quitting: 26.4   Smokeless tobacco: Never  Vaping Use   Vaping status: Never Used  Substance and Sexual Activity   Alcohol use: No   Drug use: No   Sexual  activity: Yes   Family History  Problem Relation Age of Onset   Kidney failure Mother    Hypertension Father    Kidney failure Brother    Colon cancer Neg Hx    Colon polyps Neg Hx    Rectal cancer Neg Hx    Stomach cancer Neg Hx    Breast cancer Neg Hx    Immunization History  Administered Date(s) Administered   PPD Test 08/29/2019   Td 10/02/1993, 01/06/2008     Review of Systems: Negative except as noted in the HPI.  Objective: There were no vitals filed for this visit.  Kristy [name] is a pleasant 73 y.o. female in NAD. AAO X 3.  Title   Diabetic Foot Exam - detailed Date & Time: 10/29/2023  4:15 PM Diabetic Foot exam was performed with the following findings: Yes  Visual Foot Exam completed.: Yes  Is there a history of foot ulcer?: No Is there a foot ulcer now?: No Is there swelling?: No Is there elevated skin temperature?: No Is there abnormal foot shape?: Yes (Comment: Hammertoes 2-5 b/l) Is there a claw toe deformity?: No Are the toenails long?: Yes Are the toenails thick?: Yes Are the toenails ingrown?: No Is the skin thin, fragile, shiny and hairless?": No Normal Range of Motion?: Yes Is there foot or ankle muscle weakness?: No Do you have pain in calf while walking?: No Are the shoes appropriate in style and fit?: Yes Can the patient see the bottom of their feet?: No Pulse Foot Exam completed.: Yes   Right Posterior Tibialis: Present Left posterior Tibialis: Present   Right Dorsalis Pedis: Present Left Dorsalis Pedis: Present     Sensory Foot Exam Completed.: Yes Semmes-Weinstein Monofilament Test "+" means "has sensation" and "-" means "no sensation"   R Site 1-Great Toe: Pos L Site 1-Great Toe: Pos   R Site 4: Pos L Site 4: Pos   R site 5: Pos L Site 5: Pos  R Site 6: Pos L Site 6: Pos     Image components are not supported.   Image components are not supported. Image components are not supported.  Tuning Fork Right vibratory: present  Left vibratory: present  Comments       Lab Results  Component Value Date   HGBA1C 9.9 (A) 06/13/2023   ADA Risk Categorization: Low Risk :  Patient has all of the following: Intact protective sensation No prior foot ulcer  No severe deformity Pedal pulses present  Assessment: 1. Pain due to onychomycosis of toenails of both feet   2. Diabetes mellitus due to underlying condition, uncontrolled, with hyperglycemia (HCC)   3. Acquired hammertoes of both feet   4. Encounter for diabetic foot exam [facility])     Plan: -Patient was evaluated today. All questions/concerns addressed on today's visit. -Diabetic foot examination performed today. -Continue diabetic foot care principles: inspect feet daily, monitor glucose as recommended by PCP and/or Endocrinologist, and follow prescribed diet per PCP, Endocrinologist and/or dietician. -Patient to continue soft, supportive shoe gear daily. -Toenails 1-5 b/l were debrided in length and girth with sterile nail nippers and dremel without iatrogenic bleeding.  -Patient/POA to call should there be question/concern in the interim. Return in about 3 months (around 01/27/2024).  Freddie Breech, DPM      [place] LOCATION: [address], [place] [address]                   Office [phone]   [facility] LOCATION: [address] [place], [place] [address] Office [phone]

## 2023-10-30 NOTE — Progress Notes (Unsigned)
   Nykerria Macconnell T. Keenya Matera, MD, CAQ Sports Medicine [facility] [address] [place], [address]  Phone: [phone]  FAX: [phone]  FIANNA SNOWBALL - 73 y.o. female  MRN [id number]  Date of Birth: September 09, 1951  Date: 10/31/2023  PCP: Hannah Beat, MD  Referral: Hannah Beat, MD  No chief complaint on file.  Subjective:   Kristy [name] is a 73 y.o. very pleasant female patient with There is no height or weight on file to calculate BMI. who presents with the following:  She is a pleasant patient presents with some painless red tissue at her anus.    Review of Systems is noted in the HPI, as appropriate  Objective:   There were no vitals taken for this visit.  GEN: No acute distress; alert,appropriate. PULM: Breathing comfortably in no respiratory distress PSYCH: Normally interactive.   Laboratory and Imaging Data:  Assessment and Plan:   ***

## 2023-10-31 ENCOUNTER — Encounter: Payer: Self-pay | Admitting: Family Medicine

## 2023-10-31 ENCOUNTER — Ambulatory Visit: Payer: Medicare HMO | Admitting: Family Medicine

## 2023-10-31 VITALS — BP 120/70 | HR 91 | Temp 98.3°F | Ht 62.5 in | Wt 178.5 lb

## 2023-10-31 DIAGNOSIS — K644 Residual hemorrhoidal skin tags: Secondary | ICD-10-CM

## 2023-10-31 DIAGNOSIS — K629 Disease of anus and rectum, unspecified: Secondary | ICD-10-CM | POA: Diagnosis not present

## 2023-10-31 MED ORDER — FREESTYLE LIBRE 2 SENSOR MISC: 1.0000 | 3 refills | Status: DC

## 2023-11-02 ENCOUNTER — Encounter: Payer: Self-pay | Admitting: Podiatry

## 2023-11-08 ENCOUNTER — Ambulatory Visit: Payer: Medicare HMO | Admitting: Dermatology

## 2023-11-09 ENCOUNTER — Telehealth: Payer: Self-pay

## 2023-11-09 NOTE — Progress Notes (Signed)
 Pharmacy Medication Assistance Program Note    11/09/2023  Patient ID: Kristy [name], female   DOB: June 06, 1951, 73 y.o.   MRN: [id number]     11/09/2023  Outreach Medication One  Initial Outreach Date (Medication One) 09/18/2023  Manufacturer Medication One Retail [employer] Humulin   Dose of Humulin  R 500IU  Type of Assistance Manufacturer Assistance  Date Application Sent to Patient 09/18/2023  Application Items Requested Application;Proof of Income  Name of Prescriber JACQUES SCHROEDER MD     Signature

## 2023-11-09 NOTE — Telephone Encounter (Signed)
 PAP: Patient assistance application for Humulin  through [employer] has been mailed to pt's home address on file.   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

## 2023-11-12 NOTE — Telephone Encounter (Signed)
 I will complete when the physician portion is available.  We don't have it.

## 2023-11-12 NOTE — Telephone Encounter (Signed)
 Patient brought ppwk into the office to drop off .  Placed in Dr Laurelyn Ponder box at front desk .

## 2023-11-13 NOTE — Telephone Encounter (Signed)
I HAVE FAXED PROVIDER PAGES TO PROVIDER OFFICE OF [facility] FOR REVIEW AND PROCESSING    PLEASE BE ADVISED

## 2023-11-14 NOTE — Telephone Encounter (Signed)
Completed physician portion faxed back to [facility] at [phone].

## 2023-11-20 ENCOUNTER — Telehealth: Payer: Self-pay

## 2023-11-20 NOTE — Telephone Encounter (Signed)
 PT CALL TO GIVE ME INCOME INFO I HAVE ADD TO PT APPLICATION THAT WAS SUBMITTED TO COMPANY

## 2023-11-20 NOTE — Telephone Encounter (Signed)
 Patients completed pages came to my fax. I've sent them to your fax. First page is incomplete (missing household size and income).   Hopefully Kristy [name] will still accept application, this may be an older version (it was sent to patient in September).

## 2023-11-20 NOTE — Telephone Encounter (Addendum)
 RECEIVE PROVIDER AND PT PAGES AND PAP: Application for Humulin has been submitted to [employer], via fax   PLEADE BE ADVISED

## 2023-11-20 NOTE — Telephone Encounter (Signed)
 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

## 2023-11-21 NOTE — Telephone Encounter (Signed)
 PAP: Patient has been denied for patient assistance by [employer] due to MEDICATIN IS NOT AVAILABLE THROUGH [employer]  CALLED COMPANY AND THEY STATED THAT THE COMPANY NO LONGER CARRY THE HUMULIN 500U VIALS THEY ONLY CARRY THE KWIK PEN.     PLEASE BE ADVISED I CALLED PT TO LET HER KNOW SHE WAS DENIED DUE TO THE COMPANY DO NOT CARRY THE HUMULIN 500U VIALS AND  THEY ONLY CARRY THE KWIK PEN AND SHE SAID THAT WOULD NOT WORK SO SHE WOULD CALL HER ENDO DOCTOR.   LETTER HAS BEEN SCANNED INTO MEDIA OF CHART.

## 2023-11-22 ENCOUNTER — Ambulatory Visit: Payer: Medicare HMO | Admitting: Dermatology

## 2023-11-27 ENCOUNTER — Telehealth: Payer: Self-pay | Admitting: *Deleted

## 2023-11-27 DIAGNOSIS — E1169 Type 2 diabetes mellitus with other specified complication: Secondary | ICD-10-CM

## 2023-11-27 DIAGNOSIS — E1140 Type 2 diabetes mellitus with diabetic neuropathy, unspecified: Secondary | ICD-10-CM

## 2023-11-27 DIAGNOSIS — Z79899 Other long term (current) drug therapy: Secondary | ICD-10-CM

## 2023-11-27 NOTE — Telephone Encounter (Signed)
-----   Message from Alvina Chou sent at 11/26/2023  8:59 AM EST ----- Regarding: Lab orders for MON, 3.3.25 Patient is scheduled for CPX labs, please order future labs, Thanks , Camelia Eng

## 2023-12-03 ENCOUNTER — Other Ambulatory Visit (INDEPENDENT_AMBULATORY_CARE_PROVIDER_SITE_OTHER): Payer: Medicare HMO

## 2023-12-03 DIAGNOSIS — E1140 Type 2 diabetes mellitus with diabetic neuropathy, unspecified: Secondary | ICD-10-CM

## 2023-12-03 DIAGNOSIS — E1169 Type 2 diabetes mellitus with other specified complication: Secondary | ICD-10-CM | POA: Diagnosis not present

## 2023-12-03 DIAGNOSIS — E1165 Type 2 diabetes mellitus with hyperglycemia: Secondary | ICD-10-CM

## 2023-12-03 DIAGNOSIS — E785 Hyperlipidemia, unspecified: Secondary | ICD-10-CM | POA: Diagnosis not present

## 2023-12-03 DIAGNOSIS — Z79899 Other long term (current) drug therapy: Secondary | ICD-10-CM

## 2023-12-03 LAB — CBC WITH DIFFERENTIAL/PLATELET
Basophils Absolute: 0.1 10*3/uL (ref 0.0–0.1)
Basophils Relative: 0.7 % (ref 0.0–3.0)
Eosinophils Absolute: 0.2 10*3/uL (ref 0.0–0.7)
Eosinophils Relative: 3.1 % (ref 0.0–5.0)
HCT: 43.9 % (ref 36.0–46.0)
Hemoglobin: 14.3 g/dL (ref 12.0–15.0)
Lymphocytes Relative: 32.2 % (ref 12.0–46.0)
Lymphs Abs: 2.6 10*3/uL (ref 0.7–4.0)
MCHC: 32.6 g/dL (ref 30.0–36.0)
MCV: 94.6 fl (ref 78.0–100.0)
Monocytes Absolute: 0.8 10*3/uL (ref 0.1–1.0)
Monocytes Relative: 9.6 % (ref 3.0–12.0)
Neutro Abs: 4.4 10*3/uL (ref 1.4–7.7)
Neutrophils Relative %: 54.4 % (ref 43.0–77.0)
Platelets: 158 10*3/uL (ref 150.0–400.0)
RBC: 4.64 Mil/uL (ref 3.87–5.11)
RDW: 13.6 % (ref 11.5–15.5)
WBC: 8.1 10*3/uL (ref 4.0–10.5)

## 2023-12-03 LAB — BASIC METABOLIC PANEL
BUN: 14 mg/dL (ref 6–23)
CO2: 30 meq/L (ref 19–32)
Calcium: 10.1 mg/dL (ref 8.4–10.5)
Chloride: 100 meq/L (ref 96–112)
Creatinine, Ser: 0.9 mg/dL (ref 0.40–1.20)
GFR: 63.88 mL/min (ref >60.00)
Glucose, Bld: 179 mg/dL — ABNORMAL HIGH (ref 70–99)
Potassium: 4.6 meq/L (ref 3.5–5.1)
Sodium: 139 meq/L (ref 135–145)

## 2023-12-03 LAB — LIPID PANEL
Cholesterol: 237 mg/dL — ABNORMAL HIGH (ref 0–200)
HDL: 39.4 mg/dL (ref >39.00)
LDL Cholesterol: 151 mg/dL — ABNORMAL HIGH (ref 0–99)
NonHDL: 198.04
Total CHOL/HDL Ratio: 6
Triglycerides: 235 mg/dL — ABNORMAL HIGH (ref 0.0–149.0)
VLDL: 47 mg/dL — ABNORMAL HIGH (ref 0.0–40.0)

## 2023-12-03 LAB — HEPATIC FUNCTION PANEL
ALT: 34 U/L (ref 0–35)
AST: 39 U/L — ABNORMAL HIGH (ref 0–37)
Albumin: 4 g/dL (ref 3.5–5.2)
Alkaline Phosphatase: 80 U/L (ref 39–117)
Bilirubin, Direct: 0.1 mg/dL (ref 0.0–0.3)
Total Bilirubin: 0.6 mg/dL (ref 0.2–1.2)
Total Protein: 6.9 g/dL (ref 6.0–8.3)

## 2023-12-03 LAB — MICROALBUMIN / CREATININE URINE RATIO
Creatinine,U: 95.7 mg/dL
Microalb Creat Ratio: 19 mg/g (ref 0.0–30.0)
Microalb, Ur: 1.8 mg/dL (ref 0.0–1.9)

## 2023-12-03 LAB — HEMOGLOBIN A1C: Hgb A1c MFr Bld: 10 % — ABNORMAL HIGH (ref 4.6–6.5)

## 2023-12-09 ENCOUNTER — Encounter: Payer: Self-pay | Admitting: Family Medicine

## 2023-12-09 NOTE — Progress Notes (Unsigned)
 Jennet Scroggin T. Sabir Charters, MD, CAQ Sports Medicine [facility] at [facility] [address] [place], [address]  Phone: [phone]  FAX: [phone]  CHIOMA [name] - 73 y.o. female  MRN [id number]  Date of Birth: August 31, 1951  Date: 12/10/2023  PCP: Hannah Beat, MD  Referral: Hannah Beat, MD  No chief complaint on file.  Patient Care Team: Hannah Beat, MD as PCP - General (Family Medicine) Mariah Milling, Tollie Pizza, MD as PCP - Cardiology (Cardiology) Antonieta Iba, MD as Consulting Physician (Cardiology) Kathyrn Sheriff, [facility] (Inactive) as Pharmacist (Pharmacist) Galen Manila, MD as Referring Physician (Ophthalmology) Subjective:   [name] is a 73 y.o. pleasant patient who presents for a medicare wellness examination:  Health Maintenance Summary Reviewed and updated, unless pt declines services.  Tobacco History Reviewed. Non-smoker Alcohol: No concerns, no excessive use Exercise Habits: Some activity, rec at least 30 mins 5 times a week STD concerns: none Drug Use: None Birth control method: n/a Menses regular: n/a Lumps or breast concerns: no Breast Cancer Family History: no  Patient events for Medicare wellness exam.  She has multiple ongoing severe medical problems.  She has extremely poorly controlled diabetes.  Is been this way for years and is very brittle diabetes.  She is currently seeing endocrinology.  She also has nonischemic cardiomyopathy and congestive heart failure.  She is currently on Coreg 25 mg p.o. twice daily, as well as Entresto.  She also has hypertension, hyperlipidemia, Crohn's disease.  She is behind on a number of different health maintenance issues and she has declined vaccination in the past.  COVID Shingles Influenza Prevnar RSV  Mammogram DEXA  Health Maintenance  Topic Date Due   COVID-19 Vaccine (1) Never done   Zoster Vaccines- Shingrix (1 of 2) Never done   DEXA SCAN   Never done   MAMMOGRAM  02/22/2022   OPHTHALMOLOGY EXAM  04/11/2022   Medicare Annual Wellness (AWV)  12/06/2023   INFLUENZA VACCINE  12/31/2023 (Originally 05/03/2023)   Pneumonia Vaccine 11+ Years old (1 of 2 - PCV) 06/12/2024 (Originally 06/06/1957)   HEMOGLOBIN A1C  06/04/2024   FOOT EXAM  10/28/2024   Diabetic kidney evaluation - eGFR measurement  12/02/2024   Diabetic kidney evaluation - Urine ACR  12/02/2024   Colonoscopy  10/13/2027   Hepatitis C Screening  Completed   HPV VACCINES  Aged Out   DTaP/Tdap/Td  Discontinued   Immunization History  Administered Date(s) Administered   PPD Test 08/29/2019   Td 10/02/1993, 01/06/2008    Patient Active Problem List   Diagnosis Date Noted   Advanced care planning/counseling discussion: DNR / DNI 10/04/2020    Priority: High   Patient has active power of attorney for health care: Todd Peeler 10/04/2020    Priority: High   NICM (nonischemic cardiomyopathy) (HCC) 11/08/2018    Priority: High   Chronic systolic CHF (congestive heart failure) (HCC) 08/22/2018    Priority: High   Coronary artery disease, non-occlusive 09/13/2016    Priority: High   Uncontrolled type 2 diabetes mellitus with stage 3 chronic kidney disease, with long-term current use of insulin     Priority: High   Crohn's disease (HCC) 10/13/2013    Priority: Medium    Major depressive disorder, recurrent, in full remission (HCC) 05/01/2011    Priority: Medium    Hyperlipidemia associated with type 2 diabetes mellitus (HCC) 05/11/2009    Priority: Medium    Essential hypertension 04/18/2007    Priority: Medium  Statin intolerance 12/06/2021   CKD stage 2 due to type 2 diabetes mellitus (HCC) 10/04/2020   PSORIASIS 08/02/2009   GOUT 02/05/2009   LBBB (left bundle branch block) 04/18/2007   ALLERGIC RHINITIS 04/18/2007    Past Medical History:  Diagnosis Date   Allergic rhinitis    Basal cell carcinoma 08/17/2014   Left nasal ala- Mohs at [facility]   Crohn's  disease [facility]) 10/13/2013   Diabetic neuropathy associated with type 2 diabetes mellitus (HCC)    Diverticulosis    GERD (gastroesophageal reflux disease)    Gout    HFrEF (heart failure with reduced ejection fraction) (HCC)    a. 12/2008 Cath: EF 45% w/ inf HK; b. 07/2009 Echo: EF 50-55%; c. 08/2018 Echo: EF 20-25%.   Hyperlipidemia    Hypertension    IBS (irritable bowel syndrome)    Left bundle branch block    NICM (nonischemic cardiomyopathy) (HCC)    a. 12/2008 Cath: no significant dzs, EF 45% w/ inf HK->Med Rx; b. 07/2009 Echo: EF 50-55%; c. 08/2018 Echo: EF 20-25%, ant/antsept HK, mild MR, mildly dil LA, nl RV fx; d. 08/2018 Cath: D1 80, otw nonobs dzs->Med rx.   Non-obstructive CAD (coronary artery disease)    a. 12/2008 Cath: no significant dzs, EF 45% w/ inf HK->Med Rx; b. 08/2018 Cath: LM nl, LAD min irregs, D1 80, LCX 78m/d, RCA min irregs->Med Rx.   Osteoarthritis    Patient has active power of attorney for health care: [name] 10/04/2020   Symptomatic cholelithiasis    Uncontrolled type 2 diabetes mellitus with stage 3 chronic kidney disease, with long-term current use of insulin          Past Surgical History:  Procedure Laterality Date   BREAST BIOPSY Right 06/24/2019   stereo bx/ x clip/ neg   BREAST CYST ASPIRATION     CARDIAC CATHETERIZATION     CATARACT EXTRACTION W/PHACO Right 07/26/2021   Procedure: CATARACT EXTRACTION PHACO AND INTRAOCULAR LENS PLACEMENT (IOC) RIGHT DIABETIC;  Surgeon: Galen Manila, MD;  Location: [facility];  Service: Ophthalmology;  Laterality: Right;  Diabetic 8.99 01:10.2   COLONOSCOPY     DILATION AND CURETTAGE OF UTERUS     LEFT HEART CATH AND CORONARY ANGIOGRAPHY N/A 04/24/2019   Procedure: LEFT HEART CATH AND CORONARY ANGIOGRAPHY;  Surgeon: Yvonne Kendall, MD;  Location: [facility] IN[facility]AS[facility];  Service: Cardiovascular;  Laterality: N/A;   MOHS SURGERY     nose    RIGHT/LEFT HEART CATH AND CORONARY ANGIOGRAPHY N/A  08/26/2018   Procedure: RIGHT/LEFT HEART CATH AND CORONARY ANGIOGRAPHY;  Surgeon: Iran Ouch, MD;  Location: [facility] INVASIVE CV LAB;  Service: Cardiovascular;  Laterality: N/A;   SHOULDER SURGERY     15 + yrs ago    VAGINAL HYSTERECTOMY      Family History  Problem Relation Age of Onset   Kidney failure Mother    Hypertension Father    Kidney failure Brother    Colon cancer Neg Hx    Colon polyps Neg Hx    Rectal cancer Neg Hx    Stomach cancer Neg Hx    Breast cancer Neg Hx     Social History   Social History Narrative   Not on file    Past Medical History, Surgical History, Social History, Family History, Problem List, Medications, and Allergies have been reviewed and updated if relevant.  Review of Systems: Pertinent positives are listed above.  Otherwise, a full 14 point review of  systems has been done in full and it is negative except where it is noted positive.  Objective:   There were no vitals taken for this visit.    09/22/2019    8:00 PM 09/23/2019   10:27 AM 07/26/2021    8:48 AM 12/05/2021   10:25 AM 12/06/2022   12:30 PM  Fall Risk  Falls in the past year?    0 0  Was there an injury with Fall?     0  Fall Risk Category Calculator     0  (RETIRED) Patient Fall Risk Level Moderate fall risk Moderate fall risk Low fall risk    Patient at Risk for Falls Due to     No Fall Risks  Fall risk Follow up     Falls prevention discussed;Falls evaluation completed   Ideal Body Weight:   No results found.    12/05/2021   10:25 AM 07/31/2019   12:20 PM 02/12/2019   11:13 AM 07/27/2017    9:28 AM 07/16/2017    2:43 PM  Depression screen PHQ 2/9  Decreased Interest 0 0 3 0 0  Down, Depressed, Hopeless 0 2 0 0 0  PHQ - 2 Score 0 2 3 0 0  Altered sleeping  1 0    Tired, decreased energy  1 3    Change in appetite  1 3    Feeling bad or failure about yourself   0 0    Trouble concentrating  0 0    Moving slowly or fidgety/restless  0 2    Suicidal thoughts  0  0    PHQ-9 Score  5 11    Difficult doing work/chores   Not difficult at all       GEN: well developed, well nourished, no acute distress Eyes: conjunctiva and lids normal, PERRLA, EOMI ENT: TM clear, nares clear, oral exam WNL Neck: supple, no lymphadenopathy, no thyromegaly, no JVD Pulm: clear to auscultation and percussion, respiratory effort normal CV: regular rate and rhythm, S1-S2, no murmur, rub or gallop, no bruits Chest: no scars, masses, no lumps BREAST: no lumps, no axillary LAD, no nipple discharge GI: soft, non-tender; no hepatosplenomegaly, masses; active bowel sounds all quadrants GU: Normal external female genitalia. Cervix appears intact without lesions or irritation. Vaginal canal normal without ulceration or lesion. Cervix NT to exam. Ovaries neither enlarged nor tender. (Chaperoned examination by female staff) Lymph: no cervical, axillary or inguinal adenopathy MSK: gait normal, muscle tone and strength WNL, no joint swelling, effusions, discoloration, crepitus  SKIN: clear, good turgor, color WNL, no rashes, lesions, or ulcerations Neuro: normal mental status, normal strength, sensation, and motion Psych: alert; oriented to person, place and time, normally interactive and not anxious or depressed in appearance.  All labs reviewed with patient.  Results for orders placed or performed in visit on 12/03/23  Hepatic Function Panel   Collection Time: 12/03/23 11:38 AM  Result Value Ref Range   Total Bilirubin 0.6 0.2 - 1.2 mg/dL   Bilirubin, Direct 0.1 0.0 - 0.3 mg/dL   Alkaline Phosphatase 80 39 - 117 U/L   AST 39 (H) 0 - 37 U/L   ALT 34 0 - 35 U/L   Total Protein 6.9 6.0 - 8.3 g/dL   Albumin 4.0 3.5 - 5.2 g/dL  Basic metabolic panel   Collection Time: 12/03/23 11:38 AM  Result Value Ref Range   Sodium 139 135 - 145 mEq/L   Potassium 4.6 3.5 - 5.1 mEq/L  Chloride 100 96 - 112 mEq/L   CO2 30 19 - 32 mEq/L   Glucose, Bld 179 (H) 70 - 99 mg/dL   BUN 14 6 -  23 mg/dL   Creatinine, Ser 1.61 0.40 - 1.20 mg/dL   GFR 09.60 >45.40 mL/min   Calcium 10.1 8.4 - 10.5 mg/dL  CBC with Differential/Platelet   Collection Time: 12/03/23 11:38 AM  Result Value Ref Range   WBC 8.1 4.0 - 10.5 K/uL   RBC 4.64 3.87 - 5.11 Mil/uL   Hemoglobin 14.3 12.0 - 15.0 g/dL   HCT 98.1 19.1 - 47.8 %   MCV 94.6 78.0 - 100.0 fl   MCHC 32.6 30.0 - 36.0 g/dL   RDW 29.5 62.1 - 30.8 %   Platelets 158.0 150.0 - 400.0 K/uL   Neutrophils Relative % 54.4 43.0 - 77.0 %   Lymphocytes Relative 32.2 12.0 - 46.0 %   Monocytes Relative 9.6 3.0 - 12.0 %   Eosinophils Relative 3.1 0.0 - 5.0 %   Basophils Relative 0.7 0.0 - 3.0 %   Neutro Abs 4.4 1.4 - 7.7 K/uL   Lymphs Abs 2.6 0.7 - 4.0 K/uL   Monocytes Absolute 0.8 0.1 - 1.0 K/uL   Eosinophils Absolute 0.2 0.0 - 0.7 K/uL   Basophils Absolute 0.1 0.0 - 0.1 K/uL  Microalbumin / creatinine urine ratio   Collection Time: 12/03/23 11:38 AM  Result Value Ref Range   Microalb, Ur 1.8 0.0 - 1.9 mg/dL   Creatinine,U 65.7 mg/dL   Microalb Creat Ratio 19.0 0.0 - 30.0 mg/g  Hemoglobin A1c   Collection Time: 12/03/23 11:38 AM  Result Value Ref Range   Hgb A1c MFr Bld 10.0 (H) 4.6 - 6.5 %  Lipid panel   Collection Time: 12/03/23 11:38 AM  Result Value Ref Range   Cholesterol 237 (H) 0 - 200 mg/dL   Triglycerides 846.9 (H) 0.0 - 149.0 mg/dL   HDL 62.95 >28.41 mg/dL   VLDL 32.4 (H) 0.0 - 40.1 mg/dL   LDL Cholesterol 027 (H) 0 - 99 mg/dL   Total CHOL/HDL Ratio 6    NonHDL 198.04     No results found.  Assessment and Plan:     ICD-10-CM   1. Healthcare maintenance  Z00.00       Health Maintenance Exam: The patient's preventative maintenance and recommended screening tests for an annual wellness exam were reviewed in full today. Brought up to date unless services declined.  Counselled on the importance of diet, exercise, and its role in overall health and mortality. The patient's FH and SH was reviewed, including their home  life, tobacco status, and drug and alcohol status.  Follow-up in 1 year for physical exam or additional follow-up below.  I have personally reviewed the Medicare Annual Wellness questionnaire and have noted 1. The patient's medical and social history 2. Their use of alcohol, tobacco or illicit drugs 3. Their current medications and supplements 4. The patient's functional ability including ADL's, fall risks, home safety risks and hearing or visual             impairment. 5. Diet and physical activities 6. Evidence for depression or mood disorders 7. Reviewed Updated provider list, see scanned forms and [facility] Snapshot.  8. Reviewed whether or not the patient has HCPOA or living will, and discussed what this means with the patient.  Recommended she bring in a copy for his chart in [facility].  The patients weight, height, BMI and visual acuity have been  recorded in the chart I have made referrals, counseling and provided education to the patient based review of the above and I have provided the pt with a written personalized care plan for preventive services.  I have provided the patient with a copy of your personalized plan for preventive services. Instructed to take the time to review along with their updated medication list.  Disposition: No follow-ups on file.  Future Appointments  Date Time Provider Department Center  12/10/2023  2:40 PM Hannah Beat, MD [facility]  12/27/2023  1:30 PM Sondra Barges, PA-C [facility] None  01/28/2024  3:00 PM Freddie Breech, DPM [facility]    No orders of the defined types were placed in this encounter.  There are no discontinued medications. No orders of the defined types were placed in this encounter.   Signed,  Elpidio Galea. Totiana Everson, MD   Allergies as of 12/10/2023       Reactions   Rosiglitazone Other (See Comments)   CHF   Fish Oil Other (See Comments)   Gout   Glimepiride Other (See Comments)   REACTION: hypoglycemia   Other  Other (See Comments)   Splenda caused sugars levels to increase   Repatha [evolocumab] Other (See Comments)   "Felt awful for 2 weeks"   Shellfish Allergy Other (See Comments)   Gout   Guanfacine Hcl Other (See Comments)   REACTION: unspecified   Metformin And Related    Statins Other (See Comments)   Myalgias - atorvastatin 80, rosuvastatin 5-10, pravastatin 10   Invokana [canagliflozin] Other (See Comments)   Increased thirst   Spironolactone Other (See Comments)   Patient concerns re: Pharmacologist and hx of low BP   Zetia [ezetimibe] Other (See Comments)   unspecified        Medication List        Accurate as of December 09, 2023  2:10 PM. If you have any questions, ask your nurse or doctor.          aspirin EC 81 MG tablet Take 1 tablet (81 mg total) by mouth daily.   BD Insulin Syringe U/F 31G X 5/16" 0.5 ML Misc Generic drug: Insulin Syringe-Needle U-100 USE AS DIRECTED   carvedilol 25 MG tablet Commonly known as: COREG TAKE ONE TABLET BY MOUTH TWICE DAILY WITH A MEAL   chlorhexidine 0.12 % solution Commonly known as: PERIDEX as needed.   CITRACAL PO Take 1 Scoop by mouth every other day.   Ensure Take 237 mLs by mouth daily.   Entresto 49-51 MG Generic drug: sacubitril-valsartan TAKE ONE (1) TABLET BY MOUTH TWO TIMES PER DAY   FreeStyle Libre 2 Reader Devi by Does not apply route.   FreeStyle Libre 2 Sensor Misc 1 each by Does not apply route every 14 (fourteen) days.   HUMULIN R 500 UNIT/ML injection Generic drug: insulin regular human CONCENTRATED Inject 70 Units into the skin 2 (two) times daily with a meal.   OneTouch Ultra Test test strip Generic drug: glucose blood USE TO CHECK BLOOD SUGAR UP TO 8 TIMES DAILY   Pen Needles 31G X 8 MM Misc Use to inject insulin two times a day

## 2023-12-10 ENCOUNTER — Encounter: Payer: Self-pay | Admitting: Family Medicine

## 2023-12-10 ENCOUNTER — Ambulatory Visit (INDEPENDENT_AMBULATORY_CARE_PROVIDER_SITE_OTHER): Payer: Medicare HMO | Admitting: Family Medicine

## 2023-12-10 VITALS — BP 130/80 | HR 89 | Temp 98.4°F | Ht 62.25 in | Wt 179.0 lb

## 2023-12-10 DIAGNOSIS — Z0000 Encounter for general adult medical examination without abnormal findings: Secondary | ICD-10-CM | POA: Diagnosis not present

## 2023-12-10 DIAGNOSIS — Z780 Asymptomatic menopausal state: Secondary | ICD-10-CM | POA: Diagnosis not present

## 2023-12-10 MED ORDER — HUMULIN R U-500 (CONCENTRATED) 500 UNIT/ML ~~LOC~~ SOLN: SUBCUTANEOUS | 3 refills | Status: DC

## 2023-12-10 NOTE — Progress Notes (Unsigned)
 Hearing Screening - Comments:: December 2024 Vision Screening - Comments:: February 2025

## 2023-12-10 NOTE — Patient Instructions (Addendum)
  You do not need a referral to make a mammogram appointment, and you may call to make her own mammogram appointment directly around your schedule.  MAMMOGRAPHY IN [place]:  [facility] [phone] [address] [place], [place] [address]  [facility] ([facility]ly [facility]) [address] [place], [place] [address] Phone: [phone] Toll Free: [phone]  MAMMOGRAPHY IN [place]:  [facility] ([place] or [place]) [phone] Located on the campus of [facility] ([place])  [facility] [place] ([place] [facility]) [address]  [place], [place] [address]  

## 2023-12-11 ENCOUNTER — Telehealth: Payer: Self-pay

## 2023-12-11 ENCOUNTER — Encounter: Payer: Self-pay | Admitting: Family Medicine

## 2023-12-11 NOTE — Telephone Encounter (Signed)
 Copied from CRM [id number]. Topic: General - Other >> Dec 11, 2023  3:08 PM Aletta Edouard wrote: Reason for CRM: patient needs for the nurse to give the patient insurance company a call regarding her insulin the insurance company denied her of her insulin

## 2023-12-12 ENCOUNTER — Telehealth: Payer: Self-pay

## 2023-12-12 ENCOUNTER — Other Ambulatory Visit (HOSPITAL_COMMUNITY): Payer: Self-pay

## 2023-12-12 NOTE — Telephone Encounter (Signed)
 When I attempt to run a test claim through insurance, I am told that Novolin is preferred, at $105.00. CMM will not allow me to move forward with a PA for Humulin, as it may be covered under Part B Medicare.   If change to Novolin is clinically appropriate, [employer] has patient assistance that can provide Novolin vials to Ms. Alia free of charge. Pt used to receive Humulin through [employer], but they no longer provide vials (pt states kwik pen will not work for her).  If pt requires help with pt assistance application through [employer], please reach out the [employer] so they can begin the process.  Thanks!

## 2023-12-12 NOTE — Telephone Encounter (Signed)
 Pharmacy Patient Advocate Encounter   Received notification from Pt Calls Messages that prior authorization for HumuLIN R U-500 (CONCENTRATED) 500UNIT/ML solution is required/requested.   Insurance verification completed.   The patient is insured through [facility] .   Per test claim:  Novolin is preferred by the insurance.  If suggested medication is appropriate, Please send in a new RX and discontinue this one. If not, please advise as to why it's not appropriate so that we may request a Prior Authorization. Please note, some preferred medications may still require a PA.  If the suggested medications have not been trialed and there are no contraindications to their use, the PA will not be submitted, as it will not be approved.   Novolin may be available to pt through [employer] PAP, as Novolin through insurance would cost pt $105.00

## 2023-12-12 NOTE — Telephone Encounter (Signed)
 Noted.

## 2023-12-12 NOTE — Telephone Encounter (Signed)
 Can you help patient with Patient Assistance for her insulin.

## 2023-12-21 ENCOUNTER — Other Ambulatory Visit: Payer: Self-pay | Admitting: Family Medicine

## 2023-12-21 DIAGNOSIS — Z1231 Encounter for screening mammogram for malignant neoplasm of breast: Secondary | ICD-10-CM

## 2023-12-24 NOTE — Progress Notes (Unsigned)
 Cardiology Office Note    Date:  12/27/2023   ID:  Emaya, Preston 17-Jan-1951, MRN [id number]  PCP:  Hannah Beat, MD  Cardiologist:  Julien Nordmann, MD  Electrophysiologist:  None   Chief Complaint: Follow-up  History of Present Illness:   Kristy [name] is a 73 y.o. female with history of nonobstructive CAD, HFimpEF secondary to NICM, uncontrolled diabetes with recurrent DKA, Crohn's disease, HTN, HLD, LBBB, prior tobacco use, gout, and GERD who presents for follow-up of CAD and NICM.  She was initially diagnosed with cardiomyopathy in 2010 with an EF of 45% at that time.  Diagnostic cath showed minimal, nonobstructive CAD and she was medically managed.  Patient had recovery of LV systolic function later in 2010 with echo showing an EF of 50 to 55%.  Over the years, she has struggled with controlling her diabetes and has required recurrent admissions for DKA with AKI in the past requiring intermittent cessation of heart failure therapy.  In 08/2018, she developed worsening dyspnea with repeat echo showing an EF of 20 to 25%.  She underwent diagnostic cath which again showed predominantly nonobstructive CAD with more significant stenosis in a small diagonal branch.  Continued medical therapy was recommended.  Repeat echo in 12/2018 showed subsequent improvement in LV systolic function with an EF of 50 to 55%, moderate concentric LVH, diastolic dysfunction, normal RV systolic function, normal RV cavity size, mildly dilated left atrium, no significant valvular abnormality.  She was admitted in 04/2019 with altered mental status with difficulty with word finding preceded by a fall in the bathtub.  She was found on the floor by a family member.  She was noted to be febrile and in DKA upon her admission.  High-sensitivity troponin was elevated, peaking at 5667.  Echo on 04/21/2019 showed an EF of 35 to 40%, severe hypokinesis of the mid apical anterior wall and anteroseptal wall, normal RV  systolic function, normal RV cavity size, trileaflet aortic valve with mild aortic annular calcification, mild mitral annular calcification, normal size and structure aortic root.  Imaging of the head/brain was unrevealing for stroke.  Prior to discharge, the patient underwent diagnostic cath which showed stable appearance of coronary artery since 08/2018 without new lesion to explain recent decline in LVEF or elevated troponin.  It was felt the elevated troponin may be due to supply demand ischemia and or stress-induced cardiomyopathy in the setting of severe DKA.     Echo from 05/2020 demonstrated an EF of 55 to 60%, no regional wall motion abnormalities, grade 1 diastolic dysfunction, mildly reduced RV systolic function with normal ventricular cavity size, and trivial mitral regurgitation.   She was seen in the office in 01/2022 noting weight gain, dyspnea, and chest tightness.  She was concerned that these symptoms were related to her pharmacy utilizing a different manufacturer for her medication.  Subsequent echo on 03/10/2022 demonstrated an EF of 55%, no regional wall motion abnormalities, grade 1 diastolic dysfunction, normal RV systolic function and ventricular cavity size, no significant valvular abnormalities, and an estimated right atrial pressure of 3 mmHg.  She was seen in the office in 06/2022 and noted her blood pressure was soft in the mornings.  She was not taking spironolactone secondary to Personal assistant concerns.  This medication was formerly discontinued given preserved LV systolic function and in the context of low blood pressures in the mornings.  She was last seen in the office in 06/2023 and was without acute cardiac concerns at  that time.  She preferred to minimize medications.  No changes in pharmacotherapy were pursued at that time.  She comes in continuing to do well from a cardiac perspective and is without symptoms of angina or cardiac decompensation.  No palpitations,  dyspnea, dizziness, presyncope, or syncope.  No significant lower extremity swelling or orthopnea.  Weight stable.  Since she was last seen she did have 1 mechanical fall and did not hit her head or suffer LOC.  No bleeding concerns.  Continues to prefer to minimize medications.  Reports that she is continuing to work on a heart healthy diet.  Overall, feels very well from a cardiac perspective and does not have any acute cardiac concerns at this time.   Labs independently reviewed: 12/2023 - TC 237, TG 235, HDL 39, LDL 151, A1c 10.0, Hgb 14.3, PLT 158, potassium 4.6, BUN 14, serum creatinine 0.9, albumin 4.0, AST 39, ALT 34 08/2019 - TSH normal   Past Medical History:  Diagnosis Date   Allergic rhinitis    Basal cell carcinoma 08/17/2014   Left nasal ala- Mohs at [facility]   Crohn's disease [facility]) 10/13/2013   Diabetic neuropathy associated with type 2 diabetes mellitus (HCC)    Diverticulosis    GERD (gastroesophageal reflux disease)    Gout    HFrEF (heart failure with reduced ejection fraction) (HCC)    a. 12/2008 Cath: EF 45% w/ inf HK; b. 07/2009 Echo: EF 50-55%; c. 08/2018 Echo: EF 20-25%.   Hyperlipidemia    Hypertension    IBS (irritable bowel syndrome)    Left bundle branch block    NICM (nonischemic cardiomyopathy) (HCC)    a. 12/2008 Cath: no significant dzs, EF 45% w/ inf HK->Med Rx; b. 07/2009 Echo: EF 50-55%; c. 08/2018 Echo: EF 20-25%, ant/antsept HK, mild MR, mildly dil LA, nl RV fx; d. 08/2018 Cath: D1 80, otw nonobs dzs->Med rx.   Non-obstructive CAD (coronary artery disease)    a. 12/2008 Cath: no significant dzs, EF 45% w/ inf HK->Med Rx; b. 08/2018 Cath: LM nl, LAD min irregs, D1 80, LCX 69m/d, RCA min irregs->Med Rx.   Patient has active power of attorney for health care: [name] 10/04/2020   Symptomatic cholelithiasis    Uncontrolled type 2 diabetes mellitus with stage 3 chronic kidney disease, with long-term current use of insulin          Past Surgical History:   Procedure Laterality Date   BREAST BIOPSY Right 06/24/2019   stereo bx/ x clip/ neg   BREAST CYST ASPIRATION     CARDIAC CATHETERIZATION     CATARACT EXTRACTION W/PHACO Right 07/26/2021   Procedure: CATARACT EXTRACTION PHACO AND INTRAOCULAR LENS PLACEMENT (IOC) RIGHT DIABETIC;  Surgeon: Galen Manila, MD;  Location: [facility];  Service: Ophthalmology;  Laterality: Right;  Diabetic 8.99 01:10.2   COLONOSCOPY     DILATION AND CURETTAGE OF UTERUS     LEFT HEART CATH AND CORONARY ANGIOGRAPHY N/A 04/24/2019   Procedure: LEFT HEART CATH AND CORONARY ANGIOGRAPHY;  Surgeon: Yvonne Kendall, MD;  Location: [facility];  Service: Cardiovascular;  Laterality: N/A;   MOHS SURGERY     nose    RIGHT/LEFT HEART CATH AND CORONARY ANGIOGRAPHY N/A 08/26/2018   Procedure: RIGHT/LEFT HEART CATH AND CORONARY ANGIOGRAPHY;  Surgeon: Iran Ouch, MD;  Location: [facility]VAS[facility] CV [facility];  Service: Cardiovascular;  Laterality: N/A;   SHOULDER SURGERY     15 + yrs ago    VAGINAL HYSTERECTOMY  Current Medications: Current Meds  Medication Sig   aspirin EC 81 MG EC tablet Take 1 tablet (81 mg total) by mouth daily.   BD INSULIN SYRINGE U/F 31G X 5/16" 0.5 ML MISC USE AS DIRECTED   Calcium Citrate (CITRACAL PO) Take 1 Scoop by mouth every other day.   carvedilol (COREG) 25 MG tablet TAKE ONE TABLET BY MOUTH TWICE DAILY WITH A MEAL   chlorhexidine (PERIDEX) 0.12 % solution as needed.   Continuous Glucose Receiver (FREESTYLE LIBRE 2 READER) DEVI by Does not apply route.   Continuous Glucose Sensor (FREESTYLE LIBRE 2 SENSOR) MISC 1 each by Does not apply route every 14 (fourteen) days.   Ensure (ENSURE) Take 237 mLs by mouth daily.   Insulin Pen Needle (PEN NEEDLES) 31G X 8 MM MISC Use to inject insulin two times a day   insulin regular human CONCENTRATED (HUMULIN R) 500 UNIT/ML injection Inject 80 units [place] three times a day   ONETOUCH ULTRA TEST test strip USE TO CHECK BLOOD SUGAR  UP TO 8 TIMES DAILY   sacubitril-valsartan (ENTRESTO) 49-51 MG TAKE ONE (1) TABLET BY MOUTH TWO TIMES PER DAY    Allergies:   Rosiglitazone, Fish oil, Glimepiride, Other, Repatha [evolocumab], Shellfish allergy, Guanfacine hcl, Metformin and related, Statins, Invokana [canagliflozin], Spironolactone, and Zetia [ezetimibe]   Social History   Socioeconomic History   Marital status: Widowed    Spouse name: Not on file   Number of children: Not on file   Years of education: Not on file   Highest education level: Not on file  Occupational History   Not on file  Tobacco Use   Smoking status: Former    Current packs/day: 0.00    Average packs/day: 0.8 packs/day for 30.0 years (22.5 ttl pk-yrs)    Types: Cigarettes    Start date: 06/07/1967    Quit date: 06/06/1997    Years since quitting: 26.5   Smokeless tobacco: Never  Vaping Use   Vaping status: Never Used  Substance and Sexual Activity   Alcohol use: No   Drug use: No   Sexual activity: Yes  Other Topics Concern   Not on file  Social History Narrative   Not on file   Social Drivers of Health   Financial Resource Strain: Low Risk  (02/15/2022)   Overall Financial Resource Strain (CARDIA)    Difficulty of Paying Living Expenses: Not very hard  Food Insecurity: Low Risk  (07/13/2023)   Received from [facility]   Hunger Vital Sign    Worried About Running Out of Food in the Last Year: Never true    Ran Out of Food in the Last Year: Never true  Transportation Needs: No Transportation Needs (07/13/2023)   Received from [facility]    In the past 12 months, has lack of reliable transportation kept you from medical appointments, meetings, work or from getting things needed for daily living? : No  Physical Activity: Not on file  Stress: Not on file  Social Connections: Moderately Isolated (12/06/2022)   Social Connection and Isolation Panel [NHANES]    Frequency of Communication with Friends and Family: More  than three times a week    Frequency of Social Gatherings with Friends and Family: More than three times a week    Attends Religious Services: More than 4 times per year    Active Member of [employer] or Organizations: No    Attends Banker Meetings: Never    Marital  Status: Widowed     Family History:  The patient's family history includes Hypertension in her father; Kidney failure in her brother and mother. There is no history of Colon cancer, Colon polyps, Rectal cancer, Stomach cancer, or Breast cancer.  ROS:   12-point review of systems is negative unless otherwise noted in the HPI.   EKGs/Labs/Other Studies Reviewed:    Studies reviewed were summarized above. The additional studies were reviewed today:  2D echo 03/10/2022: 1. Left ventricular ejection fraction, by estimation, is 55%. The left  ventricle has normal function. The left ventricle has no regional wall  motion abnormalities. Left ventricular diastolic parameters are consistent  with Grade I diastolic dysfunction  (impaired relaxation). The average left ventricular global longitudinal  strain is -10.9 %.   2. Right ventricular systolic function is normal. The right ventricular  size is normal.   3. The mitral valve is normal in structure. No evidence of mitral valve  regurgitation. No evidence of mitral stenosis.   4. The aortic valve is normal in structure. Aortic valve regurgitation is  not visualized. No aortic stenosis is present.   5. The inferior vena cava is normal in size with greater than 50%  respiratory variability, suggesting right atrial pressure of 3 mmHg. __________   2D echo 05/11/2020: 1. Left ventricular ejection fraction, by estimation, is 55 to 60%. The  left ventricle has normal function. The left ventricle has no regional  wall motion abnormalities. Left ventricular diastolic parameters are  consistent with Grade I diastolic  dysfunction (impaired relaxation). Elevated left atrial  pressure.   2. Right ventricular systolic function is mildly reduced. The right  ventricular size is normal. Tricuspid regurgitation signal is inadequate  for assessing PA pressure.   3. The mitral valve was not well visualized. Trivial mitral valve  regurgitation. No evidence of mitral stenosis.   4. The aortic valve is tricuspid. Aortic valve regurgitation is not  visualized. No aortic stenosis is present. __________   [facility] 04/24/2019: Conclusions: Stable appearance of coronary arteries since 08/2018 without new lesion to explain recent decline in LVEF or troponin elevation.  I suspect elevated troponin may be due to supply-demand mismatch and/or stress-induced cardiomyopathy in the setting of severe DKA. Focal D1 stenosis remains ~80%.  There is mild to moderate, non-obstructive disease involving the LAD and LCx. Mildly elevated left ventricular filling pressure.   Recommendations: Continue optimization of evidence-based heart failure.  Will continue carvedilol 3.125 mg BID and restart Entresto today. Maintain net even fluid balance; could consider started gentle diuresis tomorrow as renal function allows. Dual antiplatelet therapy with aspirin and clopidogrel for 12 months. Aggressive secondary prevention. __________   2D echo 04/21/2019: 1. Severe hypokinesis of the left ventricular, mid-apical anterior wall  and anteroseptal wall.   2. The left ventricle has moderately reduced systolic function, with an  ejection fraction of 35-40%. The cavity size was normal. Left ventricular  diastolic function could not be evaluated.   3. The right ventricle has normal systolic function. The cavity was  normal. There is mildly increased right ventricular wall thickness.   4. Left atrial size was not well visualized.   5. The aortic valve is tricuspid. Mild thickening of the aortic valve.  Aortic valve regurgitation was not assessed by color flow Doppler. Mild  aortic annular calcification  noted.   6. The mitral valve was not well visualized. There is mild mitral annular  calcification present.   7. The aortic root is normal in size  and structure.   8. The interatrial septum was not well visualized. ___________   2D echo 12/10/2018: 1. The left ventricle has low normal systolic function, with an ejection  fraction of 50-55%. The cavity size was normal. There is moderate  concentric left ventricular hypertrophy. Left ventricular diastolic  Doppler parameters are consistent with  impaired relaxation.   2. The right ventricle has normal systolic function. The cavity was  normal. There is no increase in right ventricular wall thickness.   3. Left atrial size was mildly dilated.   4. The mitral valve is grossly normal.   5. The tricuspid valve is grossly normal.   6. The aortic valve is grossly normal.   7. Significant improvement in EF since last echo. __________   [facility] 08/26/2018: Ost 1st Diag to 1st Diag lesion is 80% stenosed. Mid Cx to Dist Cx lesion is 40% stenosed.   1.  Significant one-vessel coronary artery disease involving first diagonal with mild to moderate disease in the main vessels.   2.  Left ventricular angiography was not performed.  EF was severely reduced by echo. 3.  Right heart catheterization showed normal filling pressures, normal pulmonary pressure and mildly reduced cardiac output at 3.91 with a cardiac index of 2.23.     Recommendations: The patient has nonischemic cardiomyopathy.  Recommend up titration of heart failure medications.  I am going to ask her to increase carvedilol to 6.25 mg twice daily given that she has been taking 3.25 mg twice daily.  Recommend switching lisinopril to Entresto and adding spironolactone.  If ejection fraction remains low after 3 months of optimal medical therapy, consider CRT ICD placement given underlying left bundle branch block. __________   2D echo 08/15/2018: - Left ventricle: The cavity size was mildly  dilated. Systolic    function was severely reduced. The estimated ejection fraction    was in the range of 20% to 25%. Hypokinesis of the anterior    myocardium. Hypokinesis of the anteroseptal myocardium. The study    is not technically sufficient to allow evaluation of LV diastolic    function.  - Mitral valve: There was mild regurgitation.  - Left atrium: The atrium was mildly dilated.  - Right ventricle: Systolic function was normal.  - Pulmonary arteries: Systolic pressure was within the normal    range.   EKG:  EKG is ordered today.  The EKG ordered today demonstrates NSR, 70 bpm, LBBB  Recent Labs: 12/03/2023: ALT 34; BUN 14; Creatinine, Ser 0.90; Hemoglobin 14.3; Platelets 158.0; Potassium 4.6; Sodium 139  Recent Lipid Panel    Component Value Date/Time   CHOL 237 (H) 12/03/2023 1138   TRIG 235.0 (H) 12/03/2023 1138   HDL 39.40 12/03/2023 1138   CHOLHDL 6 12/03/2023 1138   VLDL 47.0 (H) 12/03/2023 1138   LDLCALC 151 (H) 12/03/2023 1138   LDLDIRECT 187.0 12/05/2021 1035    PHYSICAL EXAM:    VS:  BP (!) 142/68   Pulse 78   Ht 5' 2.5" (1.588 m)   Wt 179 lb (81.2 kg)   BMI 32.22 kg/m   BMI: Body mass index is 32.22 kg/m.  Physical Exam Vitals reviewed.  Constitutional:      Appearance: She is well-developed.  HENT:     Head: Normocephalic and atraumatic.  Eyes:     General:        Right eye: No discharge.        Left eye: No discharge.  Cardiovascular:  Rate and Rhythm: Normal rate and regular rhythm.     Pulses:          Posterior tibial pulses are 2+ on the right side and 2+ on the left side.     Heart sounds: Normal heart sounds, S1 normal and S2 normal. Heart sounds not distant. No midsystolic click and no opening snap. No murmur heard.    No friction rub.  Pulmonary:     Effort: Pulmonary effort is normal. No respiratory distress.     Breath sounds: Normal breath sounds. No decreased breath sounds, wheezing, rhonchi or rales.  Chest:     Chest  wall: No tenderness.  Musculoskeletal:     Cervical back: Normal range of motion.     Right lower leg: No edema.     Left lower leg: No edema.  Skin:    General: Skin is warm and dry.     Nails: There is no clubbing.  Neurological:     Mental Status: She is alert and oriented to person, place, and time.  Psychiatric:        Speech: Speech normal.        Behavior: Behavior normal.        Thought Content: Thought content normal.        Judgment: Judgment normal.     Wt Readings from Last 3 Encounters:  12/27/23 179 lb (81.2 kg)  12/10/23 179 lb (81.2 kg)  10/31/23 178 lb 8 oz (81 kg)     ASSESSMENT & PLAN:   Nonobstructive CAD: No symptoms suggestive of angina.  LHC from 04/2019 showed stable nonobstructive CAD as outlined above.  Continue aggressive risk factor modification and primary prevention including aspirin 81 mg, carvedilol 25 mg twice daily, and Entresto 49/51 mg twice daily.  Not on statin as outlined below.  No indication for further ischemic testing at this time.  HFimpEF: She is euvolemic and well compensated.  She remains on carvedilol 25 mg twice daily and Entresto 49/51 mg twice daily.  No longer on spironolactone secondary to pharmaceutical manufacturer concerns, and in the context of low blood pressure previously.  Not requiring a standing loop diuretic.  Defer escalation of pharmacotherapy at this time given normalization of LV systolic function and lack of heart failure symptoms.  HLD: Intolerant to statins, ezetimibe, and PCSK9 inhibitor.  LDL 151.  Not interested in pursuing/revisiting lipid-lowering therapies.  Heart healthy diet recommended.  HTN: Blood pressure is mildly elevated in the office today though well-controlled at home.  She remains on carvedilol and Entresto as outlined above.  LBBB: Stable.  No symptoms of syncope.  EF normalized.    Disposition: F/u with Dr. Mariah Milling or an APP in 6 months.   Medication Adjustments/Labs and Tests  Ordered: Current medicines are reviewed at length with the patient today.  Concerns regarding medicines are outlined above. Medication changes, Labs and Tests ordered today are summarized above and listed in the Patient Instructions accessible in Encounters.   Signed, Eula Listen, PA-C 12/27/2023 4:55 PM     [facility] [place] [address] [place], [place] [address] [phone]

## 2023-12-25 ENCOUNTER — Telehealth: Payer: Self-pay | Admitting: *Deleted

## 2023-12-25 NOTE — Telephone Encounter (Signed)
 I reviewed letter patient dropped off.  It is not about her insulin.  Her insurance is no longer covering her Insulin Syringe 0.5/31g.  I tried to reach patient is regards to this but there was no answer.

## 2023-12-25 NOTE — Telephone Encounter (Signed)
 Pt brought by ppw from [facility] regarding insulin meds for Dr. Patsy Lager to review. Ppw is in [facility]'s folder. Call back # [phone]

## 2023-12-25 NOTE — Telephone Encounter (Signed)
 Copied from CRM [id number]. Topic: Clinical - Prescription Issue >> Dec 25, 2023 10:31 AM Eunice Blase wrote: Reason for CRM: Pt called regarding insulin regular human CONCENTRATED (HUMULIN R) 500 UNIT/ML injection. Pt stated that [employer] denied coverage. Pt would like a call back at  [phone].

## 2023-12-25 NOTE — Telephone Encounter (Signed)
 Spoke with Kristy [name].  She states she got a letter from her insurance stating they will pay for a 1 month supply of her insulin.  Patient can only use the vial and not the pen.  She states the pen does not work for her.  She would like to try and get that one month supply from [facility] on [address] if she can and she knows they have it but she was told her insurance does not cover it and it will cost her $1700 a month.  She is seeing her Endocrinologist in May and just needs enough insulin to get her to that appointment so then she will see what her endocrinologist wants to do.  She states she may have to change her insulin at that appointment.  Camelia Eng will drop off that letter this afternoon for be to review and we will go from there.  We did try to get her insulin from [facility] but they no longer offer the vials.

## 2023-12-26 ENCOUNTER — Other Ambulatory Visit: Payer: Self-pay

## 2023-12-26 NOTE — Telephone Encounter (Signed)
 Kristy [name] notified of below information via telephone.  She will get her refill at [facility] since they already have the prescription.

## 2023-12-26 NOTE — Telephone Encounter (Signed)
 I sent message to our pharmacist at [facility] to check in to cost of this insulin for patient. Per her response:   Great news! It just needed a part B vs D determination. They needed to know if she had a pump. This is fixed and the copay is $35! Do you want me to transfer it here and fill it? Because the determination is in place with [employer] she should be able to get it at [facility] now for $35 for a 1 month supply.

## 2023-12-27 ENCOUNTER — Encounter: Payer: Self-pay | Admitting: Physician Assistant

## 2023-12-27 ENCOUNTER — Ambulatory Visit: Payer: Medicare HMO | Attending: Physician Assistant | Admitting: Physician Assistant

## 2023-12-27 VITALS — BP 142/68 | HR 78 | Ht 62.5 in | Wt 179.0 lb

## 2023-12-27 DIAGNOSIS — Z789 Other specified health status: Secondary | ICD-10-CM

## 2023-12-27 DIAGNOSIS — I10 Essential (primary) hypertension: Secondary | ICD-10-CM | POA: Diagnosis not present

## 2023-12-27 DIAGNOSIS — I2510 Atherosclerotic heart disease of native coronary artery without angina pectoris: Secondary | ICD-10-CM

## 2023-12-27 DIAGNOSIS — E785 Hyperlipidemia, unspecified: Secondary | ICD-10-CM

## 2023-12-27 DIAGNOSIS — I5032 Chronic diastolic (congestive) heart failure: Secondary | ICD-10-CM

## 2023-12-27 DIAGNOSIS — I447 Left bundle-branch block, unspecified: Secondary | ICD-10-CM

## 2023-12-27 NOTE — Patient Instructions (Signed)
 Medication Instructions:  Your Physician recommend you continue on your current medication as directed.    *If you need a refill on your cardiac medications before your next appointment, please call your pharmacy*   Lab Work: None ordered at this time    Follow-Up: At [facility], you and your health needs are our priority.  As part of our continuing mission to provide you with exceptional heart care, we have created designated Provider Care Teams.  These Care Teams include your primary Cardiologist (physician) and Advanced Practice Providers (APPs -  Physician Assistants and Nurse Practitioners) who all work together to provide you with the care you need, when you need it.  We recommend signing up for the patient portal called "MyChart".  Sign up information is provided on this After Visit Summary.  MyChart is used to connect with patients for Virtual Visits (Telemedicine).  Patients are able to view lab/test results, encounter notes, upcoming appointments, etc.  Non-urgent messages can be sent to your provider as well.   To learn more about what you can do with MyChart, go to [email or web address].    Your next appointment:   6 month(s) -- please call back in July for a September appointment or if you forget we'll call you in August  Provider:   You may see Julien Nordmann, MD or Eula Listen, PA-C

## 2024-01-04 ENCOUNTER — Ambulatory Visit
Admission: RE | Admit: 2024-01-04 | Discharge: 2024-01-04 | Disposition: A | Discharge status: Home / Self Care | Source: Ambulatory Visit | Attending: Family Medicine | Admitting: Family Medicine

## 2024-01-04 ENCOUNTER — Encounter: Payer: Self-pay | Admitting: *Deleted

## 2024-01-04 ENCOUNTER — Other Ambulatory Visit: Payer: Self-pay

## 2024-01-04 DIAGNOSIS — Z1231 Encounter for screening mammogram for malignant neoplasm of breast: Secondary | ICD-10-CM | POA: Diagnosis not present

## 2024-01-04 DIAGNOSIS — Z780 Asymptomatic menopausal state: Secondary | ICD-10-CM | POA: Diagnosis not present

## 2024-01-04 MED ORDER — HUMULIN R U-500 (CONCENTRATED) 500 UNIT/ML ~~LOC~~ SOLN
80.0000 [IU] | Freq: Three times a day (TID) | SUBCUTANEOUS | 3 refills | Status: AC
  Filled 2024-01-04: qty 20, 42d supply, fill #0
  Filled 2024-02-18 – 2024-04-15 (×2): qty 20, 42d supply, fill #1
  Filled 2024-06-27: qty 20, 42d supply, fill #2
  Filled 2024-09-03: qty 20, 42d supply, fill #3

## 2024-01-04 NOTE — Telephone Encounter (Signed)
 Spoke with Aurther Loft about her insulin. She states she went to [facility] and they told her they didn't have anything for her.   We are going to send Rx over the [facility] since [place] the pharmacist there helped get this approved for her.  I have also messaged Wilkie Aye letting her know patient would now like to try and get her insulin at [facility].

## 2024-01-04 NOTE — Telephone Encounter (Signed)
 Copied from CRM [id number]. Topic: Clinical - Prescription Issue >> Dec 25, 2023 10:31 AM Eunice Blase wrote: Reason for CRM: Pt called regarding insulin regular human CONCENTRATED (HUMULIN R) 500 UNIT/ML injection. Pt stated that [employer] denied coverage. Pt would like a call back at  [phone].

## 2024-01-08 ENCOUNTER — Other Ambulatory Visit: Payer: Self-pay

## 2024-01-08 ENCOUNTER — Other Ambulatory Visit: Payer: Self-pay | Admitting: Family Medicine

## 2024-01-08 DIAGNOSIS — R928 Other abnormal and inconclusive findings on diagnostic imaging of breast: Secondary | ICD-10-CM

## 2024-01-08 MED FILL — Insulin Syringe/Needle U-100 1/2 ML 31 x 5/16": 16 days supply | Qty: 100 | Fill #0 | Status: CN

## 2024-01-15 ENCOUNTER — Encounter: Payer: Self-pay | Admitting: Dermatology

## 2024-01-15 ENCOUNTER — Ambulatory Visit: Admitting: Dermatology

## 2024-01-15 DIAGNOSIS — L918 Other hypertrophic disorders of the skin: Secondary | ICD-10-CM | POA: Diagnosis not present

## 2024-01-15 DIAGNOSIS — D492 Neoplasm of unspecified behavior of bone, soft tissue, and skin: Secondary | ICD-10-CM | POA: Diagnosis not present

## 2024-01-15 DIAGNOSIS — L821 Other seborrheic keratosis: Secondary | ICD-10-CM | POA: Diagnosis not present

## 2024-01-15 DIAGNOSIS — L820 Inflamed seborrheic keratosis: Secondary | ICD-10-CM | POA: Diagnosis not present

## 2024-01-15 DIAGNOSIS — D485 Neoplasm of uncertain behavior of skin: Secondary | ICD-10-CM

## 2024-01-15 DIAGNOSIS — L080 Pyoderma: Secondary | ICD-10-CM

## 2024-01-15 NOTE — Progress Notes (Signed)
   Follow-Up Visit   Subjective  Kristy [name] is a 73 y.o. female who presents for the following: irritated spots at neck.   The patient has spots, moles and lesions to be evaluated, some may be new or changing and the patient may have concern these could be cancer.   The following portions of the chart were reviewed this encounter and updated as appropriate: medications, allergies, medical history  Review of Systems:  No other skin or systemic complaints except as noted in HPI or Assessment and Plan.  Objective  Well appearing patient in no apparent distress; mood and affect are within normal limits.    A focused examination was performed of the following areas: Neck, legs, feet  Relevant exam findings are noted in the Assessment and Plan.  Neck (20) Erythematous stuck-on, waxy papule or plaque Left Occipital Scalp 3 mm pink eroded papule   Assessment & Plan   SEBORRHEIC KERATOSIS - Stuck-on, waxy, tan-brown papules and/or plaques  - Benign-appearing - Discussed benign etiology and prognosis. - Observe - Call for any changes  INFLAMED SEBORRHEIC KERATOSIS (20) Neck (20) And acrochordons. Symptomatic, irritating, patient would like treated.  Benign-appearing.  Call clinic for new or changing lesions.   Destruction of lesion - Neck (20) Complexity: simple   Destruction method: cryotherapy   Informed consent: discussed and consent obtained   Timeout:  patient name, date of birth, surgical site, and procedure verified Lesion destroyed using liquid nitrogen: Yes   Region frozen until ice ball extended beyond lesion: Yes   Cryo cycles: 1 or 2. Outcome: patient tolerated procedure well with no complications   Post-procedure details: wound care instructions given   NEOPLASM OF UNCERTAIN BEHAVIOR OF SKIN Left Occipital Scalp Skin / nail biopsy Type of biopsy: tangential   Informed consent: discussed and consent obtained   Timeout: patient name, date of birth,  surgical site, and procedure verified   Procedure prep:  Patient was prepped and draped in usual sterile fashion Prep type:  Isopropyl alcohol Anesthesia: the lesion was anesthetized in a standard fashion   Anesthetic:  1% lidocaine w/ epinephrine 1-100,000 buffered w/ 8.4% NaHCO3 Instrument used: DermaBlade   Hemostasis achieved with: pressure and aluminum chloride   Outcome: patient tolerated procedure well   Post-procedure details: sterile dressing applied and wound care instructions given   Dressing type: bandage and petrolatum   Specimen 1 - Surgical pathology Differential Diagnosis: r/o SCC  Check Margins: No 3 mm pink eroded papule  Return in about 4 months (around 05/16/2024) for ISK follow up, TBSE, HxBCC.  Kerstin Peeling, RMA, am acting as scribe for Harris Liming, MD .   Documentation: I have reviewed the above documentation for accuracy and completeness, and I agree with the above.  Harris Liming, MD

## 2024-01-15 NOTE — Patient Instructions (Addendum)

## 2024-01-23 LAB — SURGICAL PATHOLOGY

## 2024-01-24 ENCOUNTER — Telehealth: Payer: Self-pay

## 2024-01-24 NOTE — Telephone Encounter (Signed)
-----   Message from Tyro sent at 01/24/2024  9:34 AM EDT ----- Diagnosis: left occipital scalp :       SUPPURATIVE GRANULOMATOUS DERMATITIS CONSISTENT WITH A RUPTURED FOLLICULAR CYST    Plan: please call to share biopsy from scalp shows benign inflamed ruptured cyst. No treatment needed unless area is still bothersome after healing

## 2024-01-24 NOTE — Telephone Encounter (Signed)
Called patient. N/A. Unable to leave message.

## 2024-01-28 ENCOUNTER — Encounter: Payer: Self-pay | Admitting: Podiatry

## 2024-01-28 ENCOUNTER — Ambulatory Visit (INDEPENDENT_AMBULATORY_CARE_PROVIDER_SITE_OTHER): Payer: Medicare HMO | Admitting: Podiatry

## 2024-01-28 ENCOUNTER — Telehealth: Payer: Self-pay

## 2024-01-28 VITALS — Ht 62.5 in | Wt 179.0 lb

## 2024-01-28 DIAGNOSIS — M79674 Pain in right toe(s): Secondary | ICD-10-CM

## 2024-01-28 DIAGNOSIS — M79675 Pain in left toe(s): Secondary | ICD-10-CM | POA: Diagnosis not present

## 2024-01-28 DIAGNOSIS — B351 Tinea unguium: Secondary | ICD-10-CM

## 2024-01-28 DIAGNOSIS — E0865 Diabetes mellitus due to underlying condition with hyperglycemia: Secondary | ICD-10-CM

## 2024-01-28 NOTE — Telephone Encounter (Signed)
-----   Message from Tyro sent at 01/24/2024  9:34 AM EDT ----- Diagnosis: left occipital scalp :       SUPPURATIVE GRANULOMATOUS DERMATITIS CONSISTENT WITH A RUPTURED FOLLICULAR CYST    Plan: please call to share biopsy from scalp shows benign inflamed ruptured cyst. No treatment needed unless area is still bothersome after healing

## 2024-01-28 NOTE — Telephone Encounter (Signed)
Discussed pathology results. Patient voiced understanding.

## 2024-02-03 NOTE — Progress Notes (Signed)
 Subjective:  Patient ID: Kristy [name], female    DOB: Jun 05, 1951,  MRN: [id number]  73 y.o. female presents preventative diabetic foot care and painful, elongated thickened toenails x 10 which are symptomatic when wearing enclosed shoe gear. This interferes with his/her daily activities. Patient states she saw Dermatology and they did not want to look at her toe. Chief Complaint  Patient presents with   Nail Problem    Pt is here for [facility] last A1C was 9 PCP is Dr Geralyn Knee and LOV was in March.   New problem(s): None   PCP is Copland, Spencer, MD.  Allergies  Allergen Reactions   Rosiglitazone Other (See Comments)    CHF   Fish Oil Other (See Comments)    Gout   Glimepiride Other (See Comments)    REACTION: hypoglycemia   Other Other (See Comments)    Splenda caused sugars levels to increase   Repatha  [Evolocumab ] Other (See Comments)    "Felt awful for 2 weeks"   Shellfish Allergy Other (See Comments)    Gout   Guanfacine Hcl Other (See Comments)    REACTION: unspecified   Metformin  And Related    Statins Other (See Comments)    Myalgias - atorvastatin  80, rosuvastatin  5-10, pravastatin  10   Invokana [Canagliflozin] Other (See Comments)    Increased thirst   Spironolactone  Other (See Comments)    Patient concerns re: Pharmacologist and hx of low BP   Zetia  [Ezetimibe ] Other (See Comments)    unspecified    Review of Systems: Negative except as noted in the HPI.   Objective:  Kristy [name] is a pleasant 73 y.o. female in NAD. AAO x 3.  Vascular Examination: Vascular status intact b/l with palpable pedal pulses. CFT immediate b/l. Pedal hair present. No edema. No pain with calf compression b/l. Skin temperature gradient WNL b/l. No varicosities noted. No cyanosis or clubbing noted.  Neurological Examination: Sensation grossly intact b/l with 10 gram monofilament. Vibratory sensation intact b/l.  Dermatological Examination: Pedal skin with normal  turgor, texture and tone b/l. No open wounds nor interdigital macerations noted. Toenails 1-5 b/l thick, discolored, elongated with subungual debris and pain on dorsal palpation.   Minimal hyperkeratotic lesion medial border left great toe.  Musculoskeletal Examination: Muscle strength 5/5 to b/l LE.  No pain, crepitus noted b/l. Hammertoe(s) 2-5 bilaterally.  Radiographs: None  Last A1c:      Latest Ref Rng & Units 12/03/2023   11:38 AM 06/13/2023    2:53 PM  Hemoglobin A1C  Hemoglobin-A1c 4.6 - 6.5 % 10.0  9.9      Assessment:   1. Pain due to onychomycosis of toenails of both feet   2. Diabetes mellitus due to underlying condition, uncontrolled, with hyperglycemia (HCC)    Plan:  -Consent given for treatment as described below: -Examined patient. -Patient to continue soft, supportive shoe gear daily. -Mycotic toenails 1-5 bilaterally were debrided in length and girth with sterile nail nippers and dremel without incident. -As a courtesy, callus(es) left great toe gently filed without complication or incident. Total number pared=1. -Patient/POA to call should there be question/concern in the interim.  Return in about 3 months (around 04/28/2024).  Luella Sager, DPM      [place] LOCATION: [address]  [place], [place] [address]                   Office [phone]   [facility] LOCATION: [address] [place], [place] [address] Office [phone]

## 2024-02-08 ENCOUNTER — Other Ambulatory Visit: Payer: Self-pay

## 2024-02-08 DIAGNOSIS — R809 Proteinuria, unspecified: Secondary | ICD-10-CM | POA: Diagnosis not present

## 2024-02-08 DIAGNOSIS — Z794 Long term (current) use of insulin: Secondary | ICD-10-CM | POA: Diagnosis not present

## 2024-02-08 DIAGNOSIS — E785 Hyperlipidemia, unspecified: Secondary | ICD-10-CM | POA: Diagnosis not present

## 2024-02-08 DIAGNOSIS — E1129 Type 2 diabetes mellitus with other diabetic kidney complication: Secondary | ICD-10-CM | POA: Diagnosis not present

## 2024-02-08 MED ORDER — INSULIN REGULAR HUMAN 100 UNIT/ML IJ SOLN
70.0000 [IU] | Freq: Three times a day (TID) | INTRAMUSCULAR | 3 refills | Status: DC
  Filled 2024-02-08: qty 180, 86d supply, fill #0

## 2024-02-09 ENCOUNTER — Other Ambulatory Visit: Payer: Self-pay | Admitting: Cardiovascular Disease

## 2024-02-09 ENCOUNTER — Other Ambulatory Visit: Payer: Self-pay | Admitting: Physician Assistant

## 2024-02-11 ENCOUNTER — Other Ambulatory Visit: Payer: Self-pay | Admitting: Emergency Medicine

## 2024-02-11 ENCOUNTER — Other Ambulatory Visit: Payer: Self-pay | Admitting: Cardiovascular Disease

## 2024-02-11 ENCOUNTER — Other Ambulatory Visit: Payer: Self-pay

## 2024-02-11 MED ORDER — ENTRESTO 49-51 MG PO TABS: 1.0000 | ORAL_TABLET | Freq: Two times a day (BID) | ORAL | 3 refills | Status: DC

## 2024-02-11 MED ORDER — ENTRESTO 49-51 MG PO TABS
1.0000 | ORAL_TABLET | Freq: Two times a day (BID) | ORAL | 3 refills | Status: DC
  Filled 2024-02-11: qty 60, 30d supply, fill #0
  Filled 2024-03-17: qty 60, 30d supply, fill #1
  Filled 2024-04-15: qty 60, 30d supply, fill #2
  Filled 2024-05-26: qty 60, 30d supply, fill #3

## 2024-02-11 NOTE — Telephone Encounter (Signed)
 sacubitril -valsartan  (ENTRESTO ) 49-51 MG 60 tablet 3 02/11/2024 --  Sig - Route: Take 1 tablet by mouth 2 (two) times daily. - Oral  Sent to pharmacy as: sacubitril -valsartan  (ENTRESTO ) 49-51 MG  Notes to Pharmacy: PT IS REQUESTING REFILL  E-Prescribing Status: Receipt confirmed by pharmacy (02/11/2024  9:36 AM EDT)

## 2024-02-12 ENCOUNTER — Other Ambulatory Visit: Payer: Self-pay

## 2024-02-12 MED ORDER — HUMULIN R U-500 KWIKPEN 500 UNIT/ML ~~LOC~~ SOPN: 65.0000 [IU] | PEN_INJECTOR | Freq: Two times a day (BID) | SUBCUTANEOUS | 2 refills | Status: DC

## 2024-02-12 MED ORDER — AMOXICILLIN 500 MG PO CAPS
2000.0000 mg | ORAL_CAPSULE | ORAL | 2 refills | Status: DC
  Filled 2024-04-15: qty 12, 12d supply, fill #0

## 2024-02-12 MED ORDER — FREESTYLE LIBRE 3 SENSOR MISC
1.0000 | 4 refills | Status: AC
  Filled 2024-04-11: qty 6, 84d supply, fill #0
  Filled 2024-06-23: qty 1, 14d supply, fill #1
  Filled 2024-07-14: qty 6, 84d supply, fill #2
  Filled 2024-10-08: qty 6, 84d supply, fill #3

## 2024-02-12 MED ORDER — FREESTYLE LIBRE 3 READER DEVI
0 refills | Status: AC
Start: 2024-02-08 — End: ?
  Filled 2024-04-11: qty 1, 1d supply, fill #0

## 2024-02-14 ENCOUNTER — Other Ambulatory Visit: Payer: Self-pay

## 2024-02-14 MED FILL — Carvedilol Tab 25 MG: ORAL | 90 days supply | Qty: 180 | Fill #0 | Status: AC

## 2024-02-18 ENCOUNTER — Other Ambulatory Visit: Payer: Self-pay

## 2024-02-28 ENCOUNTER — Other Ambulatory Visit: Payer: Self-pay

## 2024-03-03 ENCOUNTER — Other Ambulatory Visit: Payer: Self-pay | Admitting: Family Medicine

## 2024-03-03 ENCOUNTER — Other Ambulatory Visit: Payer: Self-pay

## 2024-03-03 DIAGNOSIS — E1140 Type 2 diabetes mellitus with diabetic neuropathy, unspecified: Secondary | ICD-10-CM

## 2024-03-03 MED ORDER — ULTICARE INSULIN SYR 1/2 UNIT 31G X 1/4" 0.3 ML MISC
11 refills | Status: DC
  Filled 2024-03-03: qty 100, 30d supply, fill #0
  Filled 2024-04-24: qty 100, 30d supply, fill #1

## 2024-03-04 ENCOUNTER — Other Ambulatory Visit: Payer: Self-pay

## 2024-03-05 ENCOUNTER — Other Ambulatory Visit: Payer: Self-pay

## 2024-03-17 ENCOUNTER — Other Ambulatory Visit: Payer: Self-pay

## 2024-03-21 ENCOUNTER — Other Ambulatory Visit: Payer: Self-pay

## 2024-04-11 ENCOUNTER — Other Ambulatory Visit: Payer: Self-pay

## 2024-04-15 ENCOUNTER — Other Ambulatory Visit: Payer: Self-pay

## 2024-04-24 ENCOUNTER — Other Ambulatory Visit: Payer: Self-pay

## 2024-04-24 MED ORDER — INSULIN SYRINGE-NEEDLE U-100 31G X 5/16" 0.3 ML MISC
Freq: Three times a day (TID) | 10 refills | Status: AC
  Filled 2024-04-24: qty 100, 30d supply, fill #0
  Filled 2024-05-26 – 2024-05-27 (×4): qty 100, 30d supply, fill #1
  Filled 2024-07-23: qty 100, 30d supply, fill #2
  Filled 2024-09-03 (×2): qty 100, 30d supply, fill #3

## 2024-04-25 ENCOUNTER — Other Ambulatory Visit: Payer: Self-pay

## 2024-04-28 ENCOUNTER — Ambulatory Visit: Admitting: Podiatry

## 2024-04-28 ENCOUNTER — Encounter: Payer: Self-pay | Admitting: Podiatry

## 2024-04-28 DIAGNOSIS — M79675 Pain in left toe(s): Secondary | ICD-10-CM | POA: Diagnosis not present

## 2024-04-28 DIAGNOSIS — M79674 Pain in right toe(s): Secondary | ICD-10-CM

## 2024-04-28 DIAGNOSIS — E0865 Diabetes mellitus due to underlying condition with hyperglycemia: Secondary | ICD-10-CM | POA: Diagnosis not present

## 2024-04-28 DIAGNOSIS — B351 Tinea unguium: Secondary | ICD-10-CM | POA: Diagnosis not present

## 2024-05-01 ENCOUNTER — Encounter: Payer: Self-pay | Admitting: Podiatry

## 2024-05-01 NOTE — Progress Notes (Signed)
 Subjective:  Patient ID: Kristy [name], female    DOB: 09-Dec-1950,  MRN: [id number]  KEYLAH DARWISH presents to clinic today for preventative diabetic foot care and painful mycotic toenails of both feet that are difficult to trim. Pain interferes with daily activities and wearing enclosed shoe gear comfortably.  Chief Complaint  Patient presents with   [name]    [facility] Diabetic Foot Care/ Dr. Watt last visit March 2025/ A1C 9/ Blood sugar 286   New problem(s): None.   PCP is Copland, Jacques, MD.  Allergies  Allergen Reactions   Rosiglitazone Other (See Comments)    CHF   Fish Oil Other (See Comments)    Gout   Glimepiride Other (See Comments)    REACTION: hypoglycemia   Other Other (See Comments)    Splenda caused sugars levels to increase   Repatha  [Evolocumab ] Other (See Comments)    Felt awful for 2 weeks   Shellfish Allergy Other (See Comments)    Gout   Guanfacine Hcl Other (See Comments)    REACTION: unspecified   Metformin  And Related    Statins Other (See Comments)    Myalgias - atorvastatin  80, rosuvastatin  5-10, pravastatin  10   Invokana [Canagliflozin] Other (See Comments)    Increased thirst   Spironolactone  Other (See Comments)    Patient concerns re: Pharmacologist and hx of low BP   Zetia  [Ezetimibe ] Other (See Comments)    unspecified    Review of Systems: Negative except as noted in the HPI.  Objective: No changes noted in today's physical examination. There were no vitals filed for this visit. Kristy [name] is a pleasant 73 y.o. female in NAD. AAO x 3.  Vascular Examination: Capillary refill time immediate b/l. Vascular status intact b/l with palpable pedal pulses. Pedal hair present b/l. No pain with calf compression b/l. Skin temperature gradient WNL b/l. No cyanosis or clubbing b/l. No ischemia or gangrene noted b/l.   Neurological Examination: Sensation grossly intact b/l with 10 gram monofilament.  Dermatological  Examination: Pedal skin with normal turgor, texture and tone b/l.  No open wounds. No interdigital macerations.   Toenails 1-5 b/l thick, discolored, elongated with subungual debris and pain on dorsal palpation.   No hyperkeratotic nor porokeratotic lesions.  Incurvated nailplate both borders of left hallux and both borders of right hallux.  Nail border hypertrophy absent. There is tenderness to palpation. Sign(s) of infection: no clinical signs of infection noted on examination today..  Musculoskeletal Examination: Muscle strength 5/5 to all lower extremity muscle groups bilaterally. No pain, crepitus or joint limitation noted with ROM bilateral LE. Hammertoe deformity noted 2-5 b/l. Patient ambulates independent of any assistive aids.  Radiographs: None  Last A1c:      Latest Ref Rng & Units 12/03/2023   11:38 AM 06/13/2023    2:53 PM  Hemoglobin A1C  Hemoglobin-A1c 4.6 - 6.5 % 10.0  9.9    Assessment/Plan: 1. Pain due to onychomycosis of toenails of both feet   2. Diabetes mellitus due to underlying condition, uncontrolled, with hyperglycemia (HCC)     Patient was evaluated and treated. All patient's and/or POA's questions/concerns addressed on today's visit. Toenails 1-5 bilaterally debrided in length and girth without incident. Continue soft, supportive shoe gear daily. Report any pedal injuries to medical professional. Call office if there are any questions/concerns. -Patient/POA to call should there be question/concern in the interim.   Return in about 3 months (around 07/29/2024).  Delon LITTIE Merlin, DPM  [place] LOCATION: [address], [place] [address]                   Office [phone]   [facility] LOCATION: [address] [place], [place] [address] Office [phone]

## 2024-05-05 ENCOUNTER — Other Ambulatory Visit: Payer: Self-pay

## 2024-05-20 ENCOUNTER — Ambulatory Visit: Admitting: Dermatology

## 2024-05-26 ENCOUNTER — Other Ambulatory Visit: Payer: Self-pay

## 2024-05-27 ENCOUNTER — Other Ambulatory Visit: Payer: Self-pay

## 2024-05-28 ENCOUNTER — Other Ambulatory Visit: Payer: Self-pay

## 2024-06-10 NOTE — Progress Notes (Unsigned)
 Kristy Anstine T. Helayne Metsker, MD, CAQ Sports Medicine [facility] [address] [place], [address]  Phone: [phone]  FAX: [phone]  Kristy [name] - 73 y.o. female  MRN [id number]  Date of Birth: [birth year]  Date: 06/11/2024  PCP: Watt Mirza, MD  Referral: Watt Mirza, MD  No chief complaint on file.  Subjective:   Kristy [name] is a 73 y.o. very pleasant female patient with There is no height or weight on file to calculate BMI. who presents with the following:  Discussed the use of AI scribe software for clinical note transcription with the patient, who gave verbal consent to proceed.  She is here for 84-month follow-up diabetes, heart failure, hyperlipidemia, hypertension.  Very brittle diabetes which is typically not controlled at all.  She has been doing better seeing endocrinology from [facility]. History of Present Illness     Review of Systems is noted in the HPI, as appropriate  Objective:   There were no vitals taken for this visit.  GEN: No acute distress; alert,appropriate. PULM: Breathing comfortably in no respiratory distress PSYCH: Normally interactive.   Physical Exam   Laboratory and Imaging Data:  Assessment and Plan:   No diagnosis found. Assessment & Plan   Medication Management during today's office visit: No orders of the defined types were placed in this encounter.  There are no discontinued medications.  Orders placed today for conditions managed today: No orders of the defined types were placed in this encounter.   Disposition: No follow-ups on file.  Dr[employer] One speech-to-text software was used for transcription in this dictation.  Possible transcriptional errors can occur using Animal nutritionist.   Signed,  Mirza DASEN. Lella Mullany, MD   Outpatient Encounter Medications as of 06/11/2024  Medication Sig   amoxicillin  (AMOXIL ) 500 MG capsule Take 4 capsules (2,000  mg total) by mouth one hour prior to dental appointment.   aspirin  EC 81 MG EC tablet Take 1 tablet (81 mg total) by mouth daily.   Calcium  Citrate (CITRACAL PO) Take 1 Scoop by mouth every other day.   carvedilol  (COREG ) 25 MG tablet TAKE ONE TABLET BY MOUTH TWICE DAILY WITH A MEAL   chlorhexidine  (PERIDEX ) 0.12 % solution as needed.   Continuous Glucose Receiver (FREESTYLE LIBRE 2 READER) DEVI by Does not apply route.   Continuous Glucose Receiver (FREESTYLE LIBRE 3 READER) DEVI Use as directed.   Continuous Glucose Sensor (FREESTYLE LIBRE 2 SENSOR) MISC 1 each by Does not apply route every 14 (fourteen) days.   Continuous Glucose Sensor (FREESTYLE LIBRE 3 SENSOR) MISC Apply 1 sensor to the skin every 14 days.   Ensure (ENSURE) Take 237 mLs by mouth daily.   glucose blood (ONETOUCH ULTRA TEST) test strip USE TO CHECK BLOOD SUGAR UP TO 8 TIMES DAILY   Insulin  Pen Needle (PEN NEEDLES) 31G X 8 MM MISC Use to inject insulin  two times a day   insulin  regular (HUMULIN  R) 100 units/mL injection Inject 0.7 mLs (70 Units total) into the skin 3 (three) times daily before meals. May also inject at bedtime as directed.   insulin  regular human CONCENTRATED (HUMULIN  R U-500 KWIKPEN) 500 UNIT/ML KwikPen Inject 65 Units into the skin 2 (two) times daily with a meal.   insulin  regular human CONCENTRATED (HUMULIN  R) 500 UNIT/ML injection Inject 0.16 mLs (80 Units total) into the skin 3 (three) times daily.   Insulin  Syringe-Needle U-100 (B-D INS SYR ULTRAFINE .3CC/31G) 31G X  5/16 0.3 ML MISC Use to Inject insulin  into the skin 3 (three) times daily.   sacubitril -valsartan  (ENTRESTO ) 49-51 MG Take 1 tablet by mouth 2 (two) times daily.   No facility-administered encounter medications on file as of 06/11/2024.

## 2024-06-11 ENCOUNTER — Encounter: Payer: Self-pay | Admitting: Family Medicine

## 2024-06-11 ENCOUNTER — Ambulatory Visit: Admitting: Family Medicine

## 2024-06-11 VITALS — BP 126/66 | HR 68 | Temp 97.6°F | Ht 62.25 in | Wt 179.4 lb

## 2024-06-11 DIAGNOSIS — E1122 Type 2 diabetes mellitus with diabetic chronic kidney disease: Secondary | ICD-10-CM

## 2024-06-11 DIAGNOSIS — I428 Other cardiomyopathies: Secondary | ICD-10-CM

## 2024-06-11 DIAGNOSIS — I2510 Atherosclerotic heart disease of native coronary artery without angina pectoris: Secondary | ICD-10-CM

## 2024-06-11 DIAGNOSIS — N1831 Chronic kidney disease, stage 3a: Secondary | ICD-10-CM | POA: Diagnosis not present

## 2024-06-11 DIAGNOSIS — Z794 Long term (current) use of insulin: Secondary | ICD-10-CM

## 2024-06-11 DIAGNOSIS — E1169 Type 2 diabetes mellitus with other specified complication: Secondary | ICD-10-CM | POA: Diagnosis not present

## 2024-06-11 DIAGNOSIS — I10 Essential (primary) hypertension: Secondary | ICD-10-CM | POA: Diagnosis not present

## 2024-06-11 DIAGNOSIS — E785 Hyperlipidemia, unspecified: Secondary | ICD-10-CM | POA: Diagnosis not present

## 2024-06-11 LAB — POCT GLYCOSYLATED HEMOGLOBIN (HGB A1C): Hemoglobin A1C: 9.8 % — AB (ref 4.0–5.6)

## 2024-06-23 ENCOUNTER — Other Ambulatory Visit: Payer: Self-pay

## 2024-06-23 ENCOUNTER — Other Ambulatory Visit: Payer: Self-pay | Admitting: Cardiovascular Disease

## 2024-06-24 ENCOUNTER — Other Ambulatory Visit: Payer: Self-pay

## 2024-06-24 MED ORDER — SACUBITRIL-VALSARTAN 49-51 MG PO TABS
1.0000 | ORAL_TABLET | Freq: Two times a day (BID) | ORAL | 3 refills | Status: AC
  Filled 2024-06-24: qty 60, 30d supply, fill #0
  Filled 2024-07-28: qty 60, 30d supply, fill #1
  Filled 2024-09-03 (×2): qty 60, 30d supply, fill #2
  Filled 2024-10-08: qty 60, 30d supply, fill #3
  Filled 2024-10-27: qty 60, 30d supply, fill #4

## 2024-06-27 ENCOUNTER — Other Ambulatory Visit: Payer: Self-pay

## 2024-07-01 NOTE — Progress Notes (Signed)
 Kristy [name]                                          MRN: [id number]   07/01/2024   The [facility] Quality Team Specialist reviewed this patient medical record for the purposes of chart review for care gap closure. The following were reviewed: chart review for care gap closure-glycemic status assessment. A1c out of range 9.8.    VBCI Quality Team

## 2024-07-02 NOTE — Progress Notes (Unsigned)
 Cardiology Office Note    Date:  07/04/2024   ID:  [name], [name] 12-01-1950, MRN [id number]  PCP:  Watt Mirza, MD  Cardiologist:  Evalene Lunger, MD  Electrophysiologist:  None   Chief Complaint: Follow-up  History of Present Illness:   Kristy [name] is a 73 y.o. female with history of nonobstructive CAD, HFimpEF secondary to NICM, uncontrolled diabetes with recurrent DKA, Crohn's disease, HTN, HLD, LBBB, prior tobacco use, gout, and GERD who presents for follow-up of CAD and NICM.   She was initially diagnosed with cardiomyopathy in 2010 with an EF of 45% at that time.  Diagnostic cath showed minimal, nonobstructive CAD and she was medically managed.  Patient had recovery of LV systolic function later in 2010 with echo showing an EF of 50 to 55%.  Over the years, she has struggled with controlling her diabetes and has required recurrent admissions for DKA with AKI in the past requiring intermittent cessation of heart failure therapy.  In 08/2018, she developed worsening dyspnea with repeat echo showing an EF of 20 to 25%.  She underwent diagnostic cath which again showed predominantly nonobstructive CAD with more significant stenosis in a small diagonal branch.  Continued medical therapy was recommended.  Repeat echo in 12/2018 showed subsequent improvement in LV systolic function with an EF of 50 to 55%, moderate concentric LVH, diastolic dysfunction, normal RV systolic function, normal RV cavity size, mildly dilated left atrium, no significant valvular abnormality.  She was admitted in 04/2019 with altered mental status with difficulty with word finding preceded by a fall in the bathtub.  She was found on the floor by a family member.  She was noted to be febrile and in DKA upon her admission.  High-sensitivity troponin was elevated, peaking at 5667.  Echo on 04/21/2019 showed an EF of 35 to 40%, severe hypokinesis of the mid apical anterior wall and anteroseptal wall, normal RV  systolic function, normal RV cavity size, trileaflet aortic valve with mild aortic annular calcification, mild mitral annular calcification, normal size and structure aortic root.  Imaging of the head/brain was unrevealing for stroke.  Prior to discharge, the patient underwent diagnostic cath which showed stable appearance of coronary artery since 08/2018 without new lesion to explain recent decline in LVEF or elevated troponin.  It was felt the elevated troponin may be due to supply demand ischemia and or stress-induced cardiomyopathy in the setting of severe DKA.     Echo from 05/2020 demonstrated an EF of 55 to 60%, no regional wall motion abnormalities, grade 1 diastolic dysfunction, mildly reduced RV systolic function with normal ventricular cavity size, and trivial mitral regurgitation.   She was seen in the office in 01/2022 noting weight gain, dyspnea, and chest tightness.  She was concerned that these symptoms were related to her pharmacy utilizing a different manufacturer for her medication.  Subsequent echo on 03/10/2022 demonstrated an EF of 55%, no regional wall motion abnormalities, grade 1 diastolic dysfunction, normal RV systolic function and ventricular cavity size, no significant valvular abnormalities, and an estimated right atrial pressure of 3 mmHg.  She was seen in the office in 06/2022 and noted her blood pressure was soft in the mornings.  She was not taking spironolactone  secondary to Personal assistant concerns.  This medication was formerly discontinued given preserved LV systolic function and in the context of low blood pressures in the mornings.  She was last seen in 12/2023 and doing well from a cardiac perspective.  She  continued to prefer to minimize medications.  She comes in doing well from a cardiac perspective and is without symptoms of angina or cardiac decompensation.  No palpitations, dyspnea, dizziness, presyncope, or syncope.  No falls or symptoms concerning for  bleeding.  No lower extremity swelling or progressive orthopnea.  Weight is stable.  Continues to prefer to minimize medications.  Following a heart healthy diet.  Does not have any acute cardiac concerns at this time.   Labs independently reviewed: 06/2024 - A1c 9.8 12/2023 - TC 237, TG 235, HDL 39, LDL 151, Hgb 14.3, PLT 158, potassium 4.6, BUN 14, serum creatinine 0.9, albumin 4.0, AST 39, ALT 34 08/2019 - TSH normal   Past Medical History:  Diagnosis Date   Allergic rhinitis    Basal cell carcinoma 08/17/2014   Left nasal ala- Mohs at [facility]   Crohn's disease [facility]) 10/13/2013   Diabetic neuropathy associated with type 2 diabetes mellitus (HCC)    Diverticulosis    GERD (gastroesophageal reflux disease)    Gout    HFrEF (heart failure with reduced ejection fraction) (HCC)    a. 12/2008 Cath: EF 45% w/ inf HK; b. 07/2009 Echo: EF 50-55%; c. 08/2018 Echo: EF 20-25%.   Hyperlipidemia    Hypertension    IBS (irritable bowel syndrome)    Left bundle branch block    NICM (nonischemic cardiomyopathy) (HCC)    a. 12/2008 Cath: no significant dzs, EF 45% w/ inf HK->Med Rx; b. 07/2009 Echo: EF 50-55%; c. 08/2018 Echo: EF 20-25%, ant/antsept HK, mild MR, mildly dil LA, nl RV fx; d. 08/2018 Cath: D1 80, otw nonobs dzs->Med rx.   Non-obstructive CAD (coronary artery disease)    a. 12/2008 Cath: no significant dzs, EF 45% w/ inf HK->Med Rx; b. 08/2018 Cath: LM nl, LAD min irregs, D1 80, LCX 19m/d, RCA min irregs->Med Rx.   Patient has active power of attorney for health care: Kristy [name] 10/04/2020   Symptomatic cholelithiasis    Uncontrolled type 2 diabetes mellitus with stage 3 chronic kidney disease, with long-term current use of insulin           Past Surgical History:  Procedure Laterality Date   BREAST BIOPSY Right 06/24/2019   stereo bx/ x clip/ neg   BREAST CYST ASPIRATION     CARDIAC CATHETERIZATION     CATARACT EXTRACTION W/PHACO Right 07/26/2021   Procedure: CATARACT EXTRACTION PHACO  AND INTRAOCULAR LENS PLACEMENT (IOC) RIGHT DIABETIC;  Surgeon: Jaye Fallow, MD;  Location: [facility];  Service: Ophthalmology;  Laterality: Right;  Diabetic 8.99 01:10.2   COLONOSCOPY     DILATION AND CURETTAGE OF UTERUS     LEFT HEART CATH AND CORONARY ANGIOGRAPHY N/A 04/24/2019   Procedure: LEFT HEART CATH AND CORONARY ANGIOGRAPHY;  Surgeon: Mady Bruckner, MD;  Location: [facility];  Service: Cardiovascular;  Laterality: N/A;   MOHS SURGERY     nose    RIGHT/LEFT HEART CATH AND CORONARY ANGIOGRAPHY N/A 08/26/2018   Procedure: RIGHT/LEFT HEART CATH AND CORONARY ANGIOGRAPHY;  Surgeon: Darron Deatrice LABOR, MD;  Location: [facility];  Service: Cardiovascular;  Laterality: N/A;   SHOULDER SURGERY     15 + yrs ago    VAGINAL HYSTERECTOMY      Current Medications: Current Meds  Medication Sig   aspirin  EC 81 MG EC tablet Take 1 tablet (81 mg total) by mouth daily.   Calcium  Citrate (CITRACAL PO) Take 1 Scoop by mouth every other day.   carvedilol  (  COREG ) 25 MG tablet TAKE ONE TABLET BY MOUTH TWICE DAILY WITH A MEAL   chlorhexidine  (PERIDEX ) 0.12 % solution as needed.   Continuous Glucose Receiver (FREESTYLE LIBRE 3 READER) DEVI Use as directed.   Continuous Glucose Sensor (FREESTYLE LIBRE 3 SENSOR) MISC Apply 1 sensor to the skin every 14 days.   Ensure (ENSURE) Take 237 mLs by mouth daily.   glucose blood (ONETOUCH ULTRA TEST) test strip USE TO CHECK BLOOD SUGAR UP TO 8 TIMES DAILY   insulin  regular human CONCENTRATED (HUMULIN  R) 500 UNIT/ML injection Inject 0.16 mLs (80 Units total) into the skin 3 (three) times daily.   Insulin  Syringe-Needle U-100 (B-D INS SYR ULTRAFINE .3CC/31G) 31G X 5/16 0.3 ML MISC Use to Inject insulin  into the skin 3 (three) times daily.   sacubitril -valsartan  (ENTRESTO ) 49-51 MG Take 1 tablet by mouth 2 (two) times daily.    Allergies:   Rosiglitazone, Fish oil, Glimepiride, Other, Repatha  [evolocumab ], Shellfish allergy,  Guanfacine hcl, Metformin  and related, Statins, Invokana [canagliflozin], Spironolactone , and Zetia  [ezetimibe ]   Social History   Socioeconomic History   Marital status: Widowed    Spouse name: Not on file   Number of children: Not on file   Years of education: Not on file   Highest education level: Not on file  Occupational History   Not on file  Tobacco Use   Smoking status: Former    Current packs/day: 0.00    Average packs/day: 0.8 packs/day for 30.0 years (22.5 ttl pk-yrs)    Types: Cigarettes    Start date: 06/07/1967    Quit date: 06/06/1997    Years since quitting: 27.0   Smokeless tobacco: Never  Vaping Use   Vaping status: Never Used  Substance and Sexual Activity   Alcohol use: No   Drug use: No   Sexual activity: Yes  Other Topics Concern   Not on file  Social History Narrative   Not on file   Social Drivers of Health   Financial Resource Strain: Low Risk  (02/15/2022)   Overall Financial Resource Strain (CARDIA)    Difficulty of Paying Living Expenses: Not very hard  Food Insecurity: Low Risk  (07/13/2023)   Received from [facility]   Hunger Vital Sign    Within the past 12 months, you worried that your food would run out before you got money to buy more: Never true    Within the past 12 months, the food you bought just didn't last and you didn't have money to get more. : Never true  Transportation Needs: No Transportation Needs (07/13/2023)   Received from [facility]    In the past 12 months, has lack of reliable transportation kept you from medical appointments, meetings, work or from getting things needed for daily living? : No  Physical Activity: Not on file  Stress: Not on file  Social Connections: Moderately Isolated (12/06/2022)   Social Connection and Isolation Panel    Frequency of Communication with Friends and Family: More than three times a week    Frequency of Social Gatherings with Friends and Family: More than three times  a week    Attends Religious Services: More than 4 times per year    Active Member of [employer] or Organizations: No    Attends Banker Meetings: Never    Marital Status: Widowed     Family History:  The patient's family history includes Hypertension in her father; Kidney failure in her brother and mother.  There is no history of Colon cancer, Colon polyps, Rectal cancer, Stomach cancer, or Breast cancer.  ROS:   12-point review of systems is negative unless otherwise noted in the HPI.   EKGs/Labs/Other Studies Reviewed:    Studies reviewed were summarized above. The additional studies were reviewed today:  2D echo 03/10/2022: 1. Left ventricular ejection fraction, by estimation, is 55%. The left  ventricle has normal function. The left ventricle has no regional wall  motion abnormalities. Left ventricular diastolic parameters are consistent  with Grade I diastolic dysfunction  (impaired relaxation). The average left ventricular global longitudinal  strain is -10.9 %.   2. Right ventricular systolic function is normal. The right ventricular  size is normal.   3. The mitral valve is normal in structure. No evidence of mitral valve  regurgitation. No evidence of mitral stenosis.   4. The aortic valve is normal in structure. Aortic valve regurgitation is  not visualized. No aortic stenosis is present.   5. The inferior vena cava is normal in size with greater than 50%  respiratory variability, suggesting right atrial pressure of 3 mmHg. __________   2D echo 05/11/2020: 1. Left ventricular ejection fraction, by estimation, is 55 to 60%. The  left ventricle has normal function. The left ventricle has no regional  wall motion abnormalities. Left ventricular diastolic parameters are  consistent with Grade I diastolic  dysfunction (impaired relaxation). Elevated left atrial pressure.   2. Right ventricular systolic function is mildly reduced. The right  ventricular size is  normal. Tricuspid regurgitation signal is inadequate  for assessing PA pressure.   3. The mitral valve was not well visualized. Trivial mitral valve  regurgitation. No evidence of mitral stenosis.   4. The aortic valve is tricuspid. Aortic valve regurgitation is not  visualized. No aortic stenosis is present. __________   [facility] 04/24/2019: Conclusions: Stable appearance of coronary arteries since 08/2018 without new lesion to explain recent decline in LVEF or troponin elevation.  I suspect elevated troponin may be due to supply-demand mismatch and/or stress-induced cardiomyopathy in the setting of severe DKA. Focal D1 stenosis remains ~80%.  There is mild to moderate, non-obstructive disease involving the LAD and LCx. Mildly elevated left ventricular filling pressure.   Recommendations: Continue optimization of evidence-based heart failure.  Will continue carvedilol  3.125 mg BID and restart Entresto  today. Maintain net even fluid balance; could consider started gentle diuresis tomorrow as renal function allows. Dual antiplatelet therapy with aspirin  and clopidogrel  for 12 months. Aggressive secondary prevention. __________   2D echo 04/21/2019: 1. Severe hypokinesis of the left ventricular, mid-apical anterior wall  and anteroseptal wall.   2. The left ventricle has moderately reduced systolic function, with an  ejection fraction of 35-40%. The cavity size was normal. Left ventricular  diastolic function could not be evaluated.   3. The right ventricle has normal systolic function. The cavity was  normal. There is mildly increased right ventricular wall thickness.   4. Left atrial size was not well visualized.   5. The aortic valve is tricuspid. Mild thickening of the aortic valve.  Aortic valve regurgitation was not assessed by color flow Doppler. Mild  aortic annular calcification noted.   6. The mitral valve was not well visualized. There is mild mitral annular  calcification  present.   7. The aortic root is normal in size and structure.   8. The interatrial septum was not well visualized. ___________   2D echo 12/10/2018: 1. The left ventricle has low normal  systolic function, with an ejection  fraction of 50-55%. The cavity size was normal. There is moderate  concentric left ventricular hypertrophy. Left ventricular diastolic  Doppler parameters are consistent with  impaired relaxation.   2. The right ventricle has normal systolic function. The cavity was  normal. There is no increase in right ventricular wall thickness.   3. Left atrial size was mildly dilated.   4. The mitral valve is grossly normal.   5. The tricuspid valve is grossly normal.   6. The aortic valve is grossly normal.   7. Significant improvement in EF since last echo. __________   [facility] 08/26/2018: Ost 1st Diag to 1st Diag lesion is 80% stenosed. Mid Cx to Dist Cx lesion is 40% stenosed.   1.  Significant one-vessel coronary artery disease involving first diagonal with mild to moderate disease in the main vessels.   2.  Left ventricular angiography was not performed.  EF was severely reduced by echo. 3.  Right heart catheterization showed normal filling pressures, normal pulmonary pressure and mildly reduced cardiac output at 3.91 with a cardiac index of 2.23.     Recommendations: The patient has nonischemic cardiomyopathy.  Recommend up titration of heart failure medications.  I am going to ask her to increase carvedilol  to 6.25 mg twice daily given that she has been taking 3.25 mg twice daily.  Recommend switching lisinopril  to Entresto  and adding spironolactone .  If ejection fraction remains low after 3 months of optimal medical therapy, consider CRT ICD placement given underlying left bundle branch block. __________   2D echo 08/15/2018: - Left ventricle: The cavity size was mildly dilated. Systolic    function was severely reduced. The estimated ejection fraction    was in the  range of 20% to 25%. Hypokinesis of the anterior    myocardium. Hypokinesis of the anteroseptal myocardium. The study    is not technically sufficient to allow evaluation of LV diastolic    function.  - Mitral valve: There was mild regurgitation.  - Left atrium: The atrium was mildly dilated.  - Right ventricle: Systolic function was normal.  - Pulmonary arteries: Systolic pressure was within the normal    range.   EKG:  EKG is ordered today.  The EKG ordered today demonstrates NSR, 77 bpm, LBBB  Recent Labs: 12/03/2023: ALT 34; BUN 14; Creatinine, Ser 0.90; Hemoglobin 14.3; Platelets 158.0; Potassium 4.6; Sodium 139  Recent Lipid Panel    Component Value Date/Time   CHOL 237 (H) 12/03/2023 1138   TRIG 235.0 (H) 12/03/2023 1138   HDL 39.40 12/03/2023 1138   CHOLHDL 6 12/03/2023 1138   VLDL 47.0 (H) 12/03/2023 1138   LDLCALC 151 (H) 12/03/2023 1138   LDLDIRECT 187.0 12/05/2021 1035    PHYSICAL EXAM:    VS:  BP (!) 140/82 (BP Location: Left Arm, Patient Position: Sitting, Cuff Size: Normal)   Pulse 77   Ht 5' 2 (1.575 m)   Wt 180 lb 6.4 oz (81.8 kg)   SpO2 98%   BMI 33.00 kg/m   BMI: Body mass index is 33 kg/m.  Physical Exam Vitals reviewed.  Constitutional:      Appearance: She is well-developed.  HENT:     Head: Normocephalic and atraumatic.  Eyes:     General:        Right eye: No discharge.        Left eye: No discharge.  Cardiovascular:     Rate and Rhythm: Normal rate and regular rhythm.  Pulses:          Posterior tibial pulses are 2+ on the right side and 2+ on the left side.     Heart sounds: Normal heart sounds, S1 normal and S2 normal. Heart sounds not distant. No midsystolic click and no opening snap. No murmur heard.    No friction rub.  Pulmonary:     Effort: Pulmonary effort is normal. No respiratory distress.     Breath sounds: Normal breath sounds. No decreased breath sounds, wheezing, rhonchi or rales.  Musculoskeletal:     Cervical back:  Normal range of motion.     Right lower leg: No edema.     Left lower leg: No edema.  Skin:    General: Skin is warm and dry.     Nails: There is no clubbing.  Neurological:     Mental Status: She is alert and oriented to person, place, and time.  Psychiatric:        Speech: Speech normal.        Behavior: Behavior normal.        Thought Content: Thought content normal.        Judgment: Judgment normal.     Wt Readings from Last 3 Encounters:  07/04/24 180 lb 6.4 oz (81.8 kg)  06/11/24 179 lb 6 oz (81.4 kg)  01/28/24 179 lb (81.2 kg)     ASSESSMENT & PLAN:   Nonobstructive CAD: No symptoms suggestive of angina.  Continue aggressive risk factor modification and primary prevention including aspirin  81 mg and carvedilol  25 mg twice daily.  Not on a statin as outlined below.  No indication for further ischemic testing at this time.  HFimpEF: Euvolemic and well compensated.  She remains on carvedilol  25 mg twice daily and Entresto  49/51 mg twice daily.  No longer on spironolactone  secondary to pharmaceutical manufacturer concerns, and in the context of prior low blood pressure.  Not requiring a standing loop diuretic.  Prefers to minimize medication.  Defer rechallenge of MRA given normalization of LV systolic function and lack of heart failure symptoms.  HLD: She is intolerant to statins, ezetimibe , and PCSK9 inhibitor.  LDL 151 in 12/2023.  HTN: Blood pressure is mildly elevated in the office this afternoon.  Blood pressure is well-controlled at home and around 110 over 60s to 70s.  Continue carvedilol  and Entresto  as above.  LBBB: Stable.  No symptoms of syncope.  EF normalized.      Disposition: F/u with Dr. Gollan or an APP in 6 months.   Medication Adjustments/Labs and Tests Ordered: Current medicines are reviewed at length with the patient today.  Concerns regarding medicines are outlined above. Medication changes, Labs and Tests ordered today are summarized above and listed  in the Patient Instructions accessible in Encounters.   Signed, Bernardino Bring, PA-C 07/04/2024 4:30 PM     [facility] [place] [address] [place], [place] [address] [phone]

## 2024-07-04 ENCOUNTER — Ambulatory Visit: Attending: Physician Assistant | Admitting: Physician Assistant

## 2024-07-04 VITALS — BP 140/82 | HR 77 | Ht 62.0 in | Wt 180.4 lb

## 2024-07-04 DIAGNOSIS — Z789 Other specified health status: Secondary | ICD-10-CM

## 2024-07-04 DIAGNOSIS — I10 Essential (primary) hypertension: Secondary | ICD-10-CM

## 2024-07-04 DIAGNOSIS — I5020 Unspecified systolic (congestive) heart failure: Secondary | ICD-10-CM | POA: Diagnosis not present

## 2024-07-04 DIAGNOSIS — I2510 Atherosclerotic heart disease of native coronary artery without angina pectoris: Secondary | ICD-10-CM

## 2024-07-04 DIAGNOSIS — I447 Left bundle-branch block, unspecified: Secondary | ICD-10-CM

## 2024-07-04 DIAGNOSIS — E785 Hyperlipidemia, unspecified: Secondary | ICD-10-CM

## 2024-07-04 NOTE — Patient Instructions (Signed)
 Medication Instructions:  Your physician recommends that you continue on your current medications as directed. Please refer to the Current Medication list given to you today.   *If you need a refill on your cardiac medications before your next appointment, please call your pharmacy*  Lab Work: None ordered at this time   Follow-Up: At [facility], you and your health needs are our priority.  As part of our continuing mission to provide you with exceptional heart care, our providers are all part of one team.  This team includes your primary Cardiologist (physician) and Advanced Practice Providers or APPs (Physician Assistants and Nurse Practitioners) who all work together to provide you with the care you need, when you need it.  Your next appointment:   6 month(s)  Provider:   You may see Timothy Gollan, MD or Bernardino Bring, PA-C  We recommend signing up for the patient portal called MyChart.  Sign up information is provided on this After Visit Summary.  MyChart is used to connect with patients for Virtual Visits (Telemedicine).  Patients are able to view lab/test results, encounter notes, upcoming appointments, etc.  Non-urgent messages can be sent to your provider as well.   To learn more about what you can do with MyChart, go to [email or web address].

## 2024-07-14 ENCOUNTER — Other Ambulatory Visit: Payer: Self-pay

## 2024-07-23 ENCOUNTER — Other Ambulatory Visit: Payer: Self-pay

## 2024-07-24 ENCOUNTER — Other Ambulatory Visit: Payer: Self-pay

## 2024-07-28 ENCOUNTER — Other Ambulatory Visit: Payer: Self-pay

## 2024-08-01 ENCOUNTER — Telehealth: Payer: Self-pay | Admitting: Family Medicine

## 2024-08-01 ENCOUNTER — Ambulatory Visit: Admitting: Podiatry

## 2024-08-01 NOTE — Telephone Encounter (Signed)
 Statin intolerant

## 2024-08-01 NOTE — Telephone Encounter (Signed)
 Copied from CRM #[id number]. Topic: Clinical - Medical Advice >> Aug 01, 2024 10:41 AM Charolett L wrote: Reason for CRM: Safer from [employer] stated that records show that the patient has diabetes but doesn't take a staton drug and guideline recommends patients from 24-75 with diabetes take a staton to decrease heart disease risk.

## 2024-08-07 ENCOUNTER — Ambulatory Visit: Admitting: Podiatry

## 2024-08-07 ENCOUNTER — Encounter: Payer: Self-pay | Admitting: Podiatry

## 2024-08-07 DIAGNOSIS — B351 Tinea unguium: Secondary | ICD-10-CM | POA: Diagnosis not present

## 2024-08-07 DIAGNOSIS — E0865 Diabetes mellitus due to underlying condition with hyperglycemia: Secondary | ICD-10-CM

## 2024-08-07 DIAGNOSIS — M79675 Pain in left toe(s): Secondary | ICD-10-CM

## 2024-08-07 DIAGNOSIS — M79674 Pain in right toe(s): Secondary | ICD-10-CM

## 2024-08-17 NOTE — Progress Notes (Signed)
 Subjective:  Patient ID: Kristy [name], female    DOB: 07-21-1951,  MRN: [id number]  Kristy [name] presents to clinic today for preventative diabetic foot care for painful thick toenails that are difficult to trim. Pain interferes with ambulation. Aggravating factors include wearing enclosed shoe gear. Pain is relieved with periodic professional debridement.  Chief Complaint  Patient presents with   Nail Problem    Thick painful toenails, 3 month follow up    New problem(s): None.   PCP is Copland, Jacques, MD.  Kristy [name] 06/11/2024.  Allergies  Allergen Reactions   Rosiglitazone Other (See Comments)    CHF   Fish Oil Other (See Comments)    Gout   Glimepiride Other (See Comments)    REACTION: hypoglycemia   Other Other (See Comments)    Splenda caused sugars levels to increase   Repatha  [Evolocumab ] Other (See Comments)    Felt awful for 2 weeks   Shellfish Allergy Other (See Comments)    Gout   Guanfacine Hcl Other (See Comments)    REACTION: unspecified   Metformin  And Related    Statins Other (See Comments)    Myalgias - atorvastatin  80, rosuvastatin  5-10, pravastatin  10   Invokana [Canagliflozin] Other (See Comments)    Increased thirst   Spironolactone  Other (See Comments)    Patient concerns re: pharmacologist and hx of low BP   Zetia  [Ezetimibe ] Other (See Comments)    unspecified    Review of Systems: Negative except as noted in the HPI.  Objective:  There were no vitals filed for this visit. Kristy [name] is a pleasant 73 y.o. female in NAD. AAO x 3.  Vascular Examination: Capillary refill time immediate b/l. Vascular status intact b/l with palpable pedal pulses. Pedal hair present b/l. No pain with calf compression b/l. Skin temperature gradient WNL b/l. No cyanosis or clubbing b/l. No ischemia or gangrene noted b/l.   Neurological Examination: Sensation grossly intact b/l with 10 gram monofilament.  Dermatological  Examination: Pedal skin with normal turgor, texture and tone b/l.  No open wounds. No interdigital macerations.   Toenails 1-5 b/l thick, discolored, elongated with subungual debris and pain on dorsal palpation.   No hyperkeratotic nor porokeratotic lesions.  Musculoskeletal Examination: Muscle strength 5/5 to all lower extremity muscle groups bilaterally. No pain, crepitus or joint limitation noted with ROM bilateral LE. Hammertoe deformity noted 2-5 b/l. Patient ambulates independent of any assistive aids.  Radiographs: None  Assessment/Plan: 1. Pain due to onychomycosis of toenails of both feet   2. Diabetes mellitus due to underlying condition, uncontrolled, with hyperglycemia (HCC)   Consent given for treatment. Patient examined. All patient's and/or POA's questions/concerns addressed on today's visit. Mycotic toenails 1-5 b/l debrided in length and girth without incident. Continue foot and shoe inspections daily. Monitor blood glucose per PCP/Endocrinologist's recommendations.Continue soft, supportive shoe gear daily. Report any pedal injuries to medical professional. Call office if there are any quesitons/concerns. -Patient/POA to call should there be question/concern in the interim.   Return in about 3 months (around 11/07/2024).  Delon LITTIE Merlin, DPM      [place] LOCATION: [address], [place] [address]  Office [phone]   [facility] LOCATION: [address] [place], [place] [address] Office [phone]

## 2024-09-03 ENCOUNTER — Other Ambulatory Visit: Payer: Self-pay

## 2024-09-03 MED FILL — Carvedilol Tab 25 MG: ORAL | 90 days supply | Qty: 180 | Fill #1 | Status: AC

## 2024-09-03 MED FILL — Carvedilol Tab 25 MG: ORAL | 90 days supply | Qty: 180 | Fill #1 | Status: CN

## 2024-09-04 ENCOUNTER — Other Ambulatory Visit: Payer: Self-pay | Admitting: Family Medicine

## 2024-09-04 ENCOUNTER — Other Ambulatory Visit: Payer: Self-pay

## 2024-09-04 MED ORDER — AMOXICILLIN 500 MG PO CAPS
2000.0000 mg | ORAL_CAPSULE | ORAL | 2 refills | Status: AC
  Filled 2024-09-04: qty 12, 3d supply, fill #0

## 2024-09-04 NOTE — Telephone Encounter (Signed)
 Last office visit 06/11/24 for medical management of chronic issues.  Last refilled:  Not on current medication list.  I think this is prescribed prior to dental procedures.  Next Appt: CPE 12/10/2024

## 2024-09-21 DIAGNOSIS — R350 Frequency of micturition: Secondary | ICD-10-CM | POA: Diagnosis not present

## 2024-10-08 ENCOUNTER — Other Ambulatory Visit: Payer: Self-pay

## 2024-10-09 ENCOUNTER — Telehealth: Payer: Self-pay | Admitting: Pharmacy Technician

## 2024-10-09 ENCOUNTER — Other Ambulatory Visit (HOSPITAL_COMMUNITY): Payer: Self-pay

## 2024-10-09 NOTE — Telephone Encounter (Signed)
" ° °  Insurance said they will pay for generic. Rx was prescribed as generic ok. Next fill needs to be generic  "

## 2024-10-17 ENCOUNTER — Other Ambulatory Visit: Payer: Self-pay

## 2024-10-17 MED ORDER — HUMULIN R U-500 KWIKPEN 500 UNIT/ML ~~LOC~~ SOPN
15.0000 [IU] | PEN_INJECTOR | Freq: Three times a day (TID) | SUBCUTANEOUS | 6 refills | Status: AC
  Filled 2024-10-17: qty 3, 28d supply, fill #0

## 2024-10-17 MED ORDER — INSUPEN PEN NEEDLES 32G X 4 MM MISC
Freq: Three times a day (TID) | 11 refills | Status: AC | PRN
  Filled 2024-10-17: qty 200, 66d supply, fill #0

## 2024-10-17 MED ORDER — FREESTYLE LIBRE 3 SENSOR MISC: 4 refills | Status: AC

## 2024-10-21 ENCOUNTER — Other Ambulatory Visit: Payer: Self-pay

## 2024-10-22 ENCOUNTER — Telehealth: Payer: Self-pay | Admitting: Physician Assistant

## 2024-10-22 NOTE — Telephone Encounter (Signed)
 Patient dropped off [employer] paper to be signed for Entresto  prescription. Please in box please call patient when ready to be picked up ASAP. thanks

## 2024-10-23 ENCOUNTER — Other Ambulatory Visit: Payer: Self-pay | Admitting: Emergency Medicine

## 2024-10-23 DIAGNOSIS — Z79899 Other long term (current) drug therapy: Secondary | ICD-10-CM

## 2024-10-23 NOTE — Telephone Encounter (Signed)
 Spoke to RD and generic approved  Called Kristy [name] at [place], no PA required and generic is on formulary  Called pt to say we can switch to generic for pick up on 30 Jan, pt reports due to a GI condition she is unable to tolerate generic meds (apparently it's the glue used to bind instead of being pressed, pt reports going through a similar process with her insulin   Pt reports she'll pay full price and I can shred the paperwork she dropped off  Routed to RD to apprise

## 2024-10-23 NOTE — Telephone Encounter (Signed)
 Please refer patient to Pharm.D. to see if she is a candidate for any grants to assist with coverage for brand-name Entresto  due to intolerance of generic medications.  If grants are not an option, she can try to appeal with her insurance company.

## 2024-10-27 ENCOUNTER — Other Ambulatory Visit: Payer: Self-pay

## 2024-10-27 ENCOUNTER — Telehealth: Payer: Self-pay | Admitting: Pharmacy Technician

## 2024-10-27 ENCOUNTER — Other Ambulatory Visit (HOSPITAL_COMMUNITY): Payer: Self-pay

## 2024-10-27 NOTE — Telephone Encounter (Signed)
 Patient Advocate Encounter   The patient was approved for a [employer] grant that will help cover the cost of Entresto  Total amount awarded, 7500.00.  Effective: 09/27/24 - 09/26/25   APW:[id number] ERW:[id number] Group:[id number] PI:[id number]   [employer] ID: [id number]   Pharmacy provided with approval and processing information.

## 2024-11-17 ENCOUNTER — Ambulatory Visit: Admitting: Podiatry

## 2024-12-03 ENCOUNTER — Other Ambulatory Visit

## 2024-12-10 ENCOUNTER — Encounter: Admitting: Family Medicine

## 2024-12-10 ENCOUNTER — Other Ambulatory Visit

## 2024-12-11 ENCOUNTER — Ambulatory Visit: Admitting: Pharmacist

## 2024-12-17 ENCOUNTER — Encounter: Admitting: Family Medicine

## 2024-12-25 ENCOUNTER — Ambulatory Visit: Admitting: Physician Assistant
